# Patient Record
Sex: Male | Born: 1985 | Race: White | Hispanic: No | Marital: Single | Smoking: Never smoker
Health system: Southern US, Community
[De-identification: ages and names within clinical notes are randomized; demographics above are authoritative.]

## PROBLEM LIST (undated history)

## (undated) DIAGNOSIS — G7281 Critical illness myopathy: Secondary | ICD-10-CM

## (undated) DIAGNOSIS — E119 Type 2 diabetes mellitus without complications: Secondary | ICD-10-CM

## (undated) DIAGNOSIS — H9191 Unspecified hearing loss, right ear: Secondary | ICD-10-CM

## (undated) DIAGNOSIS — D62 Acute posthemorrhagic anemia: Secondary | ICD-10-CM

## (undated) DIAGNOSIS — G4730 Sleep apnea, unspecified: Secondary | ICD-10-CM

## (undated) DIAGNOSIS — I10 Essential (primary) hypertension: Secondary | ICD-10-CM

## (undated) HISTORY — PX: ULNAR NERVE REPAIR: SHX2594

## (undated) HISTORY — PX: TONSILLECTOMY: SUR1361

---
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
## 2006-09-26 ENCOUNTER — Emergency Department (HOSPITAL_COMMUNITY): Admission: EM | Admit: 2006-09-26 | Discharge: 2006-09-26 | Payer: Self-pay | Admitting: Emergency Medicine

## 2009-02-01 ENCOUNTER — Emergency Department (HOSPITAL_COMMUNITY): Admission: EM | Admit: 2009-02-01 | Discharge: 2009-02-01 | Payer: Self-pay | Admitting: Emergency Medicine

## 2009-02-01 IMAGING — CT CT PELVIS W/ CM
2 of 5 series · 16 of 46 positions shown, 18 images · IV contrast (Omnipaque 300)
Comparison: None

CT ABDOMEN

CLINICAL DATA: Left-sided abdominal and umbilical pain.  Nausea.

CT ABDOMEN AND PELVIS WITH CONTRAST
TECHNIQUE: Multidetector CT imaging of the abdomen and pelvis was
performed using the standard protocol following bolus
administration of intravenous contrast.
Contrast: 100 ml [OU]

[Series 2: abd_pel_with 5.0 b40f · axial · 0.81mm/px · z∈[-526,-80]mm · 13 of 99 slices shown, 15 images]
[im 5/99  soft-tissue]
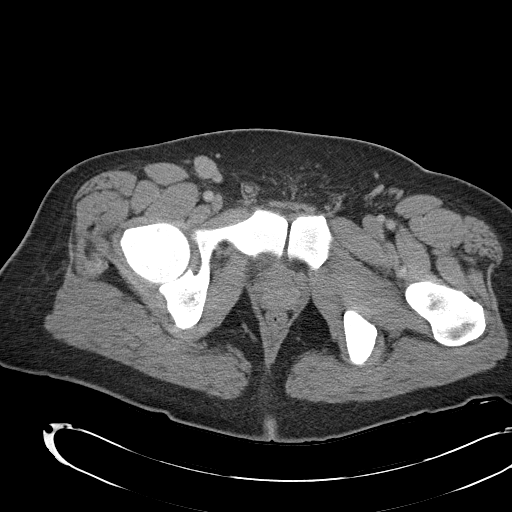
[im 5/99  bone]
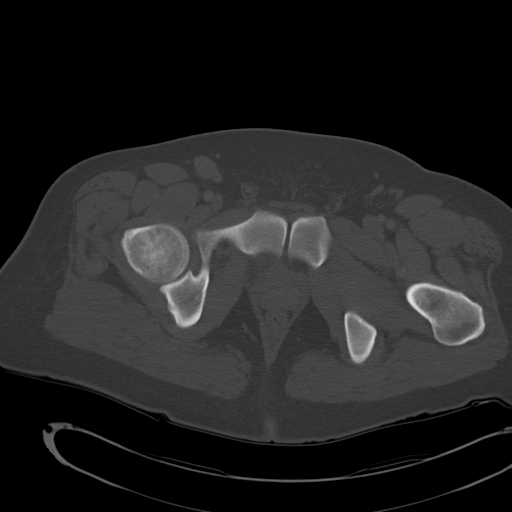
[im 15/99  soft-tissue]
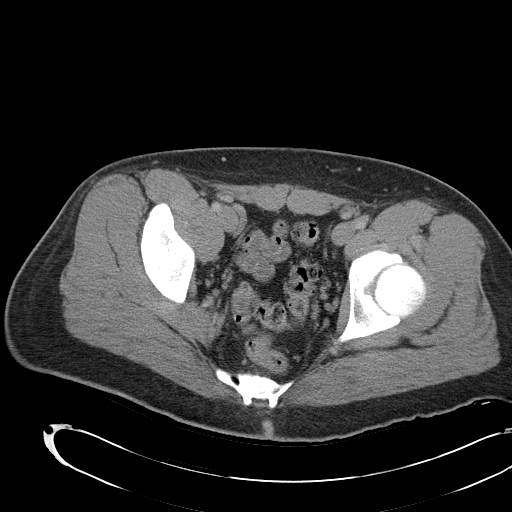
[im 19/99  soft-tissue]
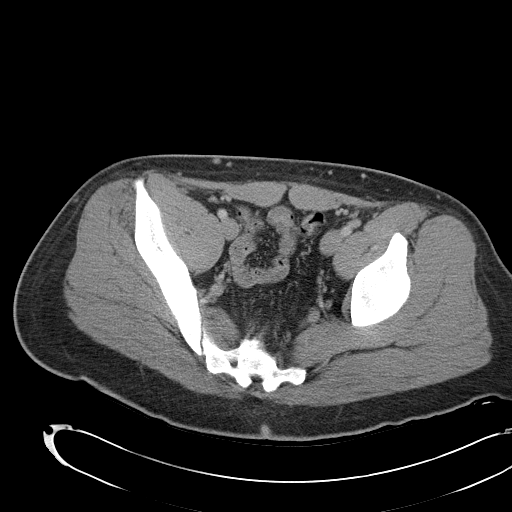
[im 29/99  soft-tissue]
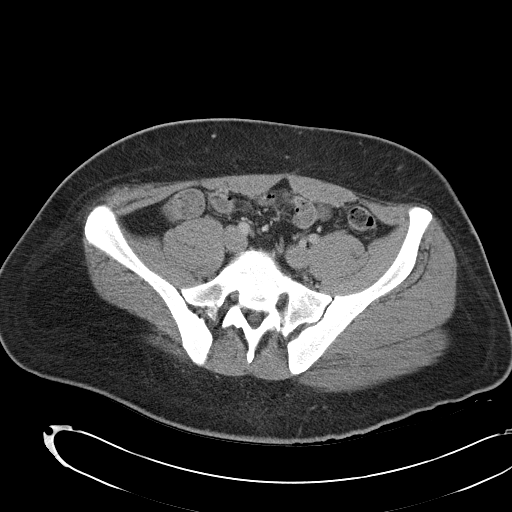
[im 33/99  soft-tissue]
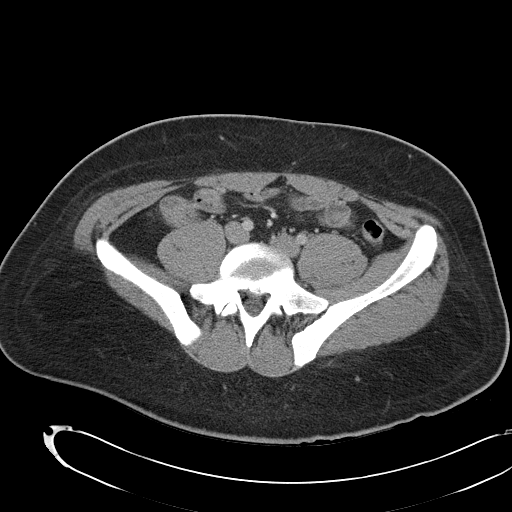
[im 43/99  soft-tissue]
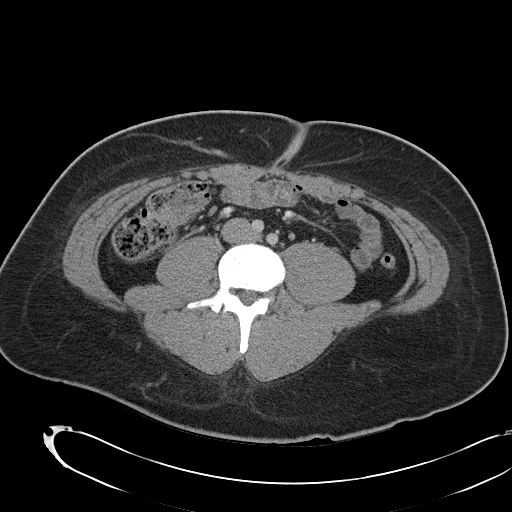
[im 52/99  soft-tissue]
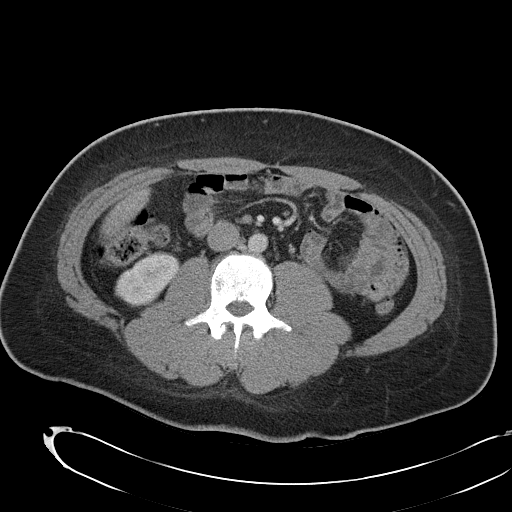
[im 57/99  soft-tissue]
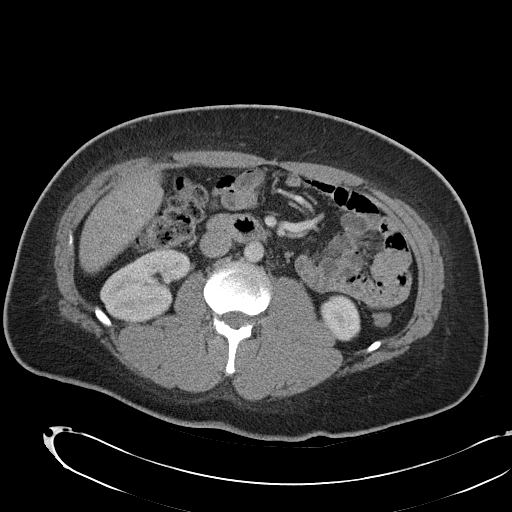
[im 66/99  soft-tissue]
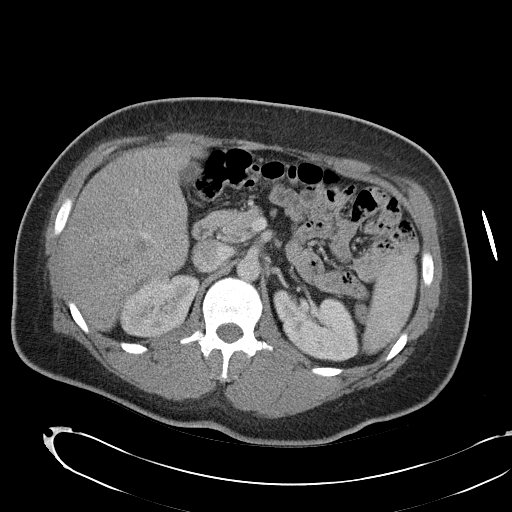
[im 66/99  bone]
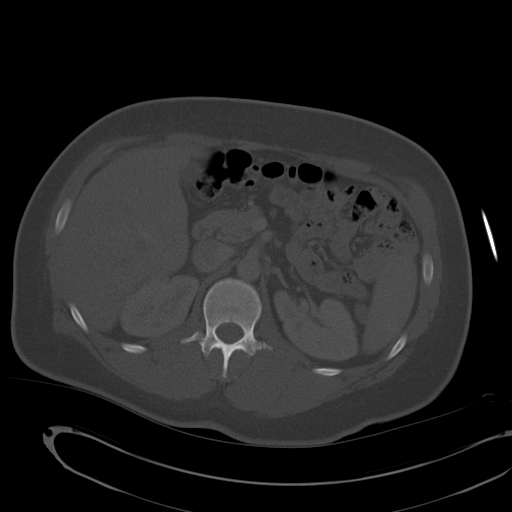
[im 71/99  soft-tissue]
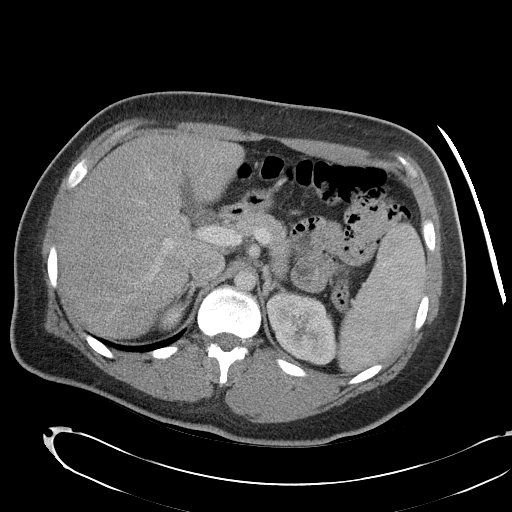
[im 80/99  soft-tissue]
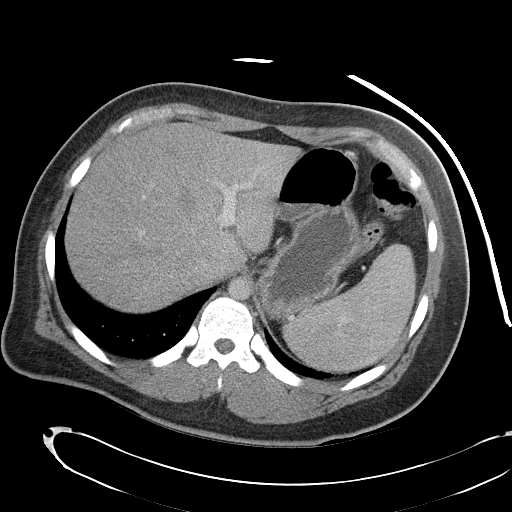
[im 85/99  soft-tissue]
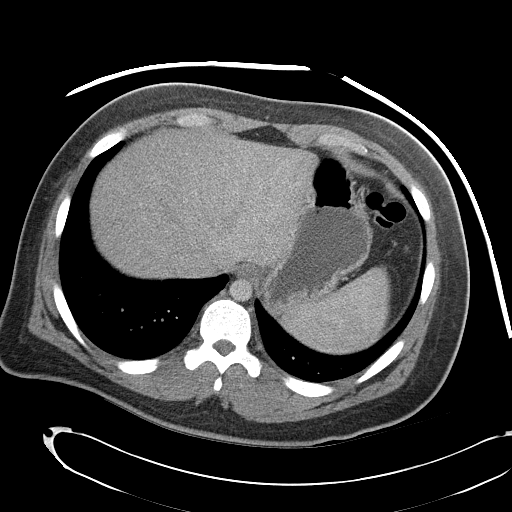
[im 94/99  soft-tissue]
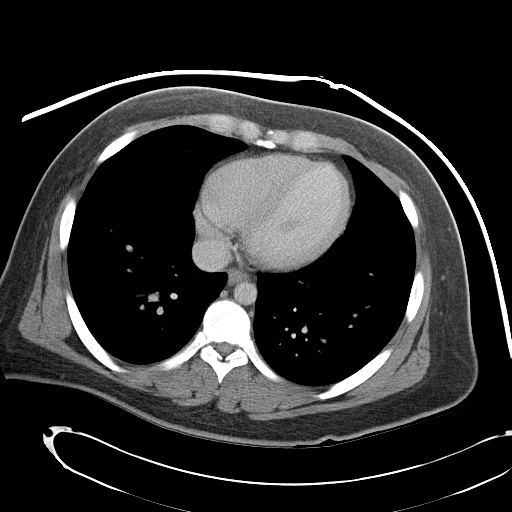

[Series 4: mpr cor post contrast (id) · coronal · 0.75mm/px · 3 of 82 slices shown]
[im 28/82  soft-tissue]
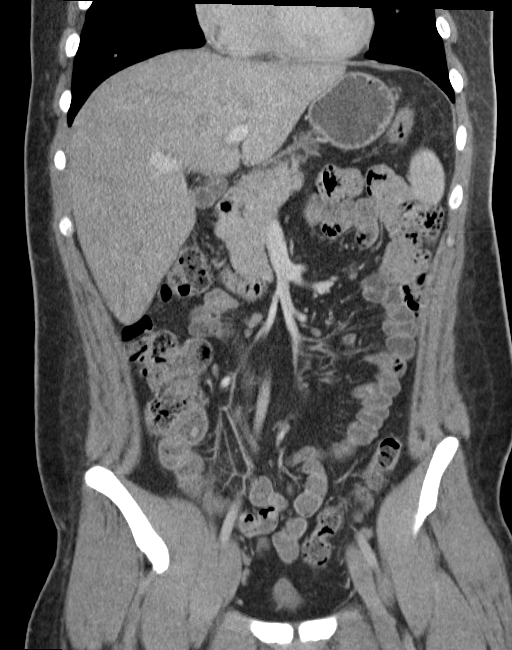
[im 37/82  soft-tissue]
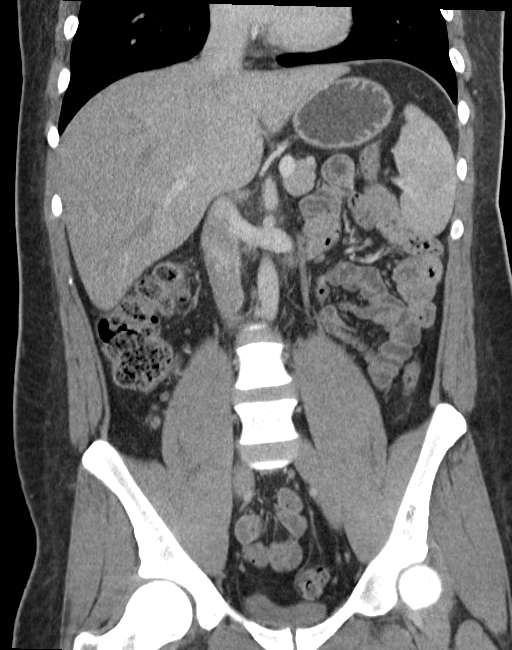
[im 46/82  soft-tissue]
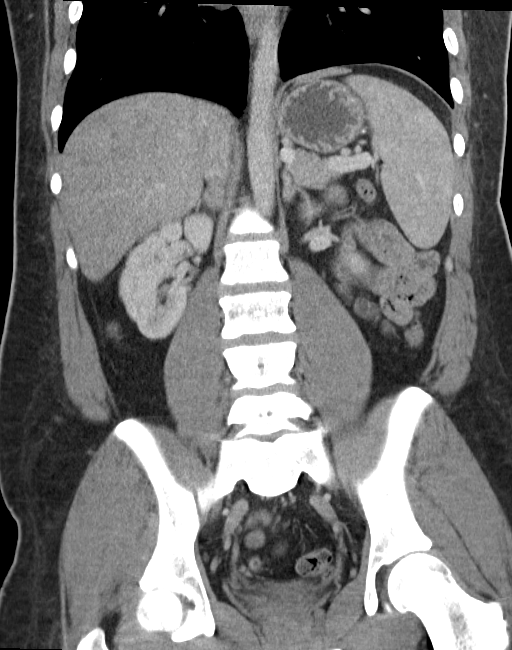

[16 of 46 positions shown; findings below may reference images not displayed]

FINDINGS: The abdominal parenchymal organs are normal in
appearance.  Gallbladder is unremarkable, and there is no evidence
of hydronephrosis.

There is no evidence of soft tissue masses or lymphadenopathy.  No
evidence of inflammatory process or abnormal fluid collections.
Unopacified bowel loops are unremarkable in appearance.
IMPRESSION: Negative abdomen CT.  No acute findings.

CT PELVIS
FINDINGS: There is no evidence of pelvic soft tissue masses or
lymphadenopathy.  There is no evidence of inflammatory process or
abnormal fluid collections.  Unopacified pelvic bowel loops are
unremarkable in appearance.  Shotty lymph nodes are seen in both
inguinal regions.
IMPRESSION: No acute findings.

## 2009-02-08 ENCOUNTER — Emergency Department: Payer: Self-pay | Admitting: Emergency Medicine

## 2009-07-22 ENCOUNTER — Emergency Department: Payer: Self-pay | Admitting: Internal Medicine

## 2010-11-18 LAB — DIFFERENTIAL
Band Neutrophils: 0 % (ref 0–10)
Basophils Absolute: 0 10*3/uL (ref 0.0–0.1)
Basophils Relative: 0 % (ref 0–1)
Blasts: 0 %
Eosinophils Absolute: 0.4 10*3/uL (ref 0.0–0.7)
Eosinophils Relative: 5 % (ref 0–5)
Lymphocytes Relative: 18 % (ref 12–46)
Lymphs Abs: 1.5 10*3/uL (ref 0.7–4.0)
Metamyelocytes Relative: 0 %
Monocytes Absolute: 0.7 10*3/uL (ref 0.1–1.0)
Monocytes Relative: 9 % (ref 3–12)
Myelocytes: 0 %
Neutro Abs: 5.7 10*3/uL (ref 1.7–7.7)
Neutrophils Relative %: 68 % (ref 43–77)
Promyelocytes Absolute: 0 %
nRBC: 0 /100 WBC

## 2010-11-18 LAB — URINALYSIS, ROUTINE W REFLEX MICROSCOPIC
Bilirubin Urine: NEGATIVE
Glucose, UA: NEGATIVE mg/dL
Hgb urine dipstick: NEGATIVE
Ketones, ur: NEGATIVE mg/dL
Nitrite: NEGATIVE
Protein, ur: NEGATIVE mg/dL
Specific Gravity, Urine: >1.03 — ABNORMAL HIGH (ref 1.005–1.030)
Urobilinogen, UA: 0.2 mg/dL (ref 0.0–1.0)
pH: 5.5 (ref 5.0–8.0)

## 2010-11-18 LAB — CBC
HCT: 45.4 % (ref 39.0–52.0)
Hemoglobin: 15.7 g/dL (ref 13.0–17.0)
MCHC: 34.6 g/dL (ref 30.0–36.0)
MCV: 91.9 fL (ref 78.0–100.0)
Platelets: 161 10*3/uL (ref 150–400)
RBC: 4.94 MIL/uL (ref 4.22–5.81)
RDW: 14 % (ref 11.5–15.5)
WBC: 8.3 10*3/uL (ref 4.0–10.5)

## 2010-11-18 LAB — COMPREHENSIVE METABOLIC PANEL
ALT: 28 U/L (ref 0–53)
AST: 19 U/L (ref 0–37)
Albumin: 4.1 g/dL (ref 3.5–5.2)
Alkaline Phosphatase: 57 U/L (ref 39–117)
BUN: 15 mg/dL (ref 6–23)
CO2: 32 mEq/L (ref 19–32)
Calcium: 9.5 mg/dL (ref 8.4–10.5)
Chloride: 106 mEq/L (ref 96–112)
Creatinine, Ser: 0.87 mg/dL (ref 0.4–1.5)
GFR calc Af Amer: >60 mL/min (ref >60)
GFR calc non Af Amer: >60 mL/min (ref >60)
Glucose, Bld: 91 mg/dL (ref 70–99)
Potassium: 4 mEq/L (ref 3.5–5.1)
Sodium: 141 mEq/L (ref 135–145)
Total Bilirubin: 0.5 mg/dL (ref 0.3–1.2)
Total Protein: 6.1 g/dL (ref 6.0–8.3)

## 2011-05-17 ENCOUNTER — Emergency Department (HOSPITAL_COMMUNITY): Payer: No Typology Code available for payment source

## 2011-05-17 ENCOUNTER — Emergency Department (HOSPITAL_COMMUNITY)
Admission: EM | Admit: 2011-05-17 | Discharge: 2011-05-17 | Disposition: A | Discharge status: Home / Self Care | Payer: No Typology Code available for payment source | Attending: Emergency Medicine | Admitting: Emergency Medicine

## 2011-05-17 DIAGNOSIS — S301XXA Contusion of abdominal wall, initial encounter: Secondary | ICD-10-CM | POA: Insufficient documentation

## 2011-05-17 DIAGNOSIS — M79609 Pain in unspecified limb: Secondary | ICD-10-CM | POA: Insufficient documentation

## 2011-05-17 DIAGNOSIS — M545 Low back pain, unspecified: Secondary | ICD-10-CM | POA: Insufficient documentation

## 2011-05-17 DIAGNOSIS — R109 Unspecified abdominal pain: Secondary | ICD-10-CM | POA: Insufficient documentation

## 2011-05-17 DIAGNOSIS — S8011XA Contusion of right lower leg, initial encounter: Secondary | ICD-10-CM

## 2011-05-17 DIAGNOSIS — S8010XA Contusion of unspecified lower leg, initial encounter: Secondary | ICD-10-CM | POA: Insufficient documentation

## 2011-05-17 LAB — URINALYSIS, ROUTINE W REFLEX MICROSCOPIC
Bilirubin Urine: NEGATIVE
Glucose, UA: NEGATIVE mg/dL
Ketones, ur: NEGATIVE mg/dL
Leukocytes, UA: NEGATIVE
Nitrite: NEGATIVE
Protein, ur: NEGATIVE mg/dL
Specific Gravity, Urine: 1.02 (ref 1.005–1.030)
Urobilinogen, UA: 0.2 mg/dL (ref 0.0–1.0)
pH: 7.5 (ref 5.0–8.0)

## 2011-05-17 LAB — DIFFERENTIAL
Basophils Absolute: 0 10*3/uL (ref 0.0–0.1)
Basophils Relative: 0 % (ref 0–1)
Eosinophils Absolute: 0 10*3/uL (ref 0.0–0.7)
Eosinophils Relative: 0 % (ref 0–5)
Lymphocytes Relative: 13 % (ref 12–46)
Lymphs Abs: 1.3 10*3/uL (ref 0.7–4.0)
Monocytes Absolute: 0.7 10*3/uL (ref 0.1–1.0)
Monocytes Relative: 7 % (ref 3–12)
Neutro Abs: 8.2 10*3/uL — ABNORMAL HIGH (ref 1.7–7.7)
Neutrophils Relative %: 80 % — ABNORMAL HIGH (ref 43–77)

## 2011-05-17 LAB — BASIC METABOLIC PANEL
BUN: 11 mg/dL (ref 6–23)
CO2: 29 mEq/L (ref 19–32)
Calcium: 9.1 mg/dL (ref 8.4–10.5)
Chloride: 102 mEq/L (ref 96–112)
Creatinine, Ser: 0.84 mg/dL (ref 0.50–1.35)
GFR calc Af Amer: >90 mL/min (ref >90)
GFR calc non Af Amer: >90 mL/min (ref >90)
Glucose, Bld: 104 mg/dL — ABNORMAL HIGH (ref 70–99)
Potassium: 4.1 mEq/L (ref 3.5–5.1)
Sodium: 138 mEq/L (ref 135–145)

## 2011-05-17 LAB — CBC
HCT: 45 % (ref 39.0–52.0)
Hemoglobin: 15.5 g/dL (ref 13.0–17.0)
MCH: 30.9 pg (ref 26.0–34.0)
MCHC: 34.4 g/dL (ref 30.0–36.0)
MCV: 89.8 fL (ref 78.0–100.0)
Platelets: 179 10*3/uL (ref 150–400)
RBC: 5.01 MIL/uL (ref 4.22–5.81)
RDW: 12.9 % (ref 11.5–15.5)
WBC: 10.3 10*3/uL (ref 4.0–10.5)

## 2011-05-17 LAB — URINE MICROSCOPIC-ADD ON
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
## 2011-05-17 MED ORDER — ONDANSETRON HCL 4 MG/2ML IJ SOLN
4.0000 mg | Freq: Once | INTRAMUSCULAR | Status: AC
  Administered 2011-05-17: 4 mg via INTRAVENOUS
  Filled 2011-05-17: qty 2

## 2011-05-17 MED ORDER — HYDROCODONE-ACETAMINOPHEN 5-325 MG PO TABS: 1.0000 | ORAL_TABLET | ORAL | Status: AC | PRN

## 2011-05-17 MED ORDER — HYDROMORPHONE HCL 1 MG/ML IJ SOLN
1.0000 mg | Freq: Once | INTRAMUSCULAR | Status: AC
  Administered 2011-05-17: 1 mg via INTRAVENOUS
  Filled 2011-05-17: qty 1

## 2011-05-17 MED ORDER — IOHEXOL 300 MG/ML  SOLN
100.0000 mL | Freq: Once | INTRAMUSCULAR | Status: AC | PRN
  Administered 2011-05-17: 100 mL via INTRAVENOUS

## 2011-05-17 NOTE — ED Notes (Signed)
[name] to nurse's station stating "my son has leg pain and there is a lump there". Dr. Adriana Simas made aware and is in to reassess patient.
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
## 2015-09-22 ENCOUNTER — Encounter: Payer: Self-pay | Admitting: Emergency Medicine

## 2015-09-22 ENCOUNTER — Emergency Department
Admission: EM | Admit: 2015-09-22 | Discharge: 2015-09-22 | Disposition: A | Discharge status: Home / Self Care | Payer: No Typology Code available for payment source | Attending: Emergency Medicine | Admitting: Emergency Medicine

## 2015-09-22 DIAGNOSIS — K0889 Other specified disorders of teeth and supporting structures: Secondary | ICD-10-CM | POA: Insufficient documentation

## 2015-09-22 DIAGNOSIS — Z79899 Other long term (current) drug therapy: Secondary | ICD-10-CM | POA: Insufficient documentation

## 2015-09-22 DIAGNOSIS — K047 Periapical abscess without sinus: Secondary | ICD-10-CM | POA: Insufficient documentation

## 2015-09-22 DIAGNOSIS — K029 Dental caries, unspecified: Secondary | ICD-10-CM | POA: Insufficient documentation

## 2015-09-22 MED ORDER — PENICILLIN V POTASSIUM 500 MG PO TABS
500.0000 mg | ORAL_TABLET | Freq: Once | ORAL | Status: AC
  Administered 2015-09-22: 500 mg via ORAL
  Filled 2015-09-22: qty 1

## 2015-09-22 MED ORDER — TRAMADOL HCL 50 MG PO TABS: 50.0000 mg | ORAL_TABLET | Freq: Two times a day (BID) | ORAL | Status: DC

## 2015-09-22 MED ORDER — PENICILLIN V POTASSIUM 500 MG PO TABS: 500.0000 mg | ORAL_TABLET | Freq: Four times a day (QID) | ORAL | Status: DC

## 2015-09-22 NOTE — Discharge Instructions (Signed)
Dental Abscess °A dental abscess is a collection of pus in or around a tooth. °CAUSES °This condition is caused by a bacterial infection around the root of the tooth that involves the inner part of the tooth (pulp). It may result from: °· Severe tooth decay. °· Trauma to the tooth that allows bacteria to enter into the pulp, such as a broken or chipped tooth. °· Severe gum disease around a tooth. °SYMPTOMS °Symptoms of this condition include: °· Severe pain in and around the infected tooth. °· Swelling and redness around the infected tooth, in the mouth, or in the face. °· Tenderness. °· Pus drainage. °· Bad breath. °· Bitter taste in the mouth. °· Difficulty swallowing. °· Difficulty opening the mouth. °· Nausea. °· Vomiting. °· Chills. °· Swollen neck glands. °· Fever. °DIAGNOSIS °This condition is diagnosed with examination of the infected tooth. During the exam, your dentist may tap on the infected tooth. Your dentist will also ask about your medical and dental history and may order X-rays. °TREATMENT °This condition is treated by eliminating the infection. This may be done with: °· Antibiotic medicine. °· A root canal. This may be performed to save the tooth. °· Pulling (extracting) the tooth. This may also involve draining the abscess. This is done if the tooth cannot be saved. °HOME CARE INSTRUCTIONS °· Take medicines only as directed by your dentist. °· If you were prescribed antibiotic medicine, finish all of it even if you start to feel better. °· Rinse your mouth (gargle) often with salt water to relieve pain or swelling. °· Do not drive or operate heavy machinery while taking pain medicine. °· Do not apply heat to the outside of your mouth. °· Keep all follow-up visits as directed by your dentist. This is important. °SEEK MEDICAL CARE IF: °· Your pain is worse and is not helped by medicine. °SEEK IMMEDIATE MEDICAL CARE IF: °· You have a fever or chills. °· Your symptoms suddenly get worse. °· You have a  very bad headache. °· You have problems breathing or swallowing. °· You have trouble opening your mouth. °· You have swelling in your neck or around your eye. °  °This information is not intended to replace advice given to you by your health care provider. Make sure you discuss any questions you have with your health care provider. °  °Document Released: 07/28/2005 Document Revised: 12/12/2014 Document Reviewed: 07/25/2014 °Elsevier Interactive Patient Education ©2016 Elsevier Inc. ° °Dental Caries °Dental caries (also called tooth decay) is the most common oral disease. It can occur at any age but is more common in children and young adults.  °HOW DENTAL CARIES DEVELOPS  °The process of decay begins when bacteria and foods (particularly sugars and starches) combine in your mouth to produce plaque. Plaque is a substance that sticks to the hard, outer surface of a tooth (enamel). The bacteria in plaque produce acids that attack enamel. These acids may also attack the root surface of a tooth (cementum) if it is exposed. Repeated attacks dissolve these surfaces and create holes in the tooth (cavities). If left untreated, the acids destroy the other layers of the tooth.  °RISK FACTORS °· Frequent sipping of sugary beverages.   °· Frequent snacking on sugary and starchy foods, especially those that easily get stuck in the teeth.   °· Poor oral hygiene.   °· Dry mouth.   °· Substance abuse such as methamphetamine abuse.   °· Broken or poor-fitting dental restorations.   °· Eating disorders.   °· Gastroesophageal reflux disease (  GERD).   Certain radiation treatments to the head and neck. SYMPTOMS In the early stages of dental caries, symptoms are seldom present. Sometimes white, chalky areas may be seen on the enamel or other tooth layers. In later stages, symptoms may include:  Pits and holes on the enamel.  Toothache after sweet, hot, or cold foods or drinks are consumed.  Pain around the tooth.  Swelling  around the tooth. DIAGNOSIS  Most of the time, dental caries is detected during a regular dental checkup. A diagnosis is made after a thorough medical and dental history is taken and the surfaces of your teeth are checked for signs of dental caries. Sometimes special instruments, such as lasers, are used to check for dental caries. Dental X-ray exams may be taken so that areas not visible to the eye (such as between the contact areas of the teeth) can be checked for cavities.  TREATMENT  If dental caries is in its early stages, it may be reversed with a fluoride treatment or an application of a remineralizing agent at the dental office. Thorough brushing and flossing at home is needed to aid these treatments. If it is in its later stages, treatment depends on the location and extent of tooth destruction:   If a small area of the tooth has been destroyed, the destroyed area will be removed and cavities will be filled with a material such as gold, silver amalgam, or composite resin.   If a large area of the tooth has been destroyed, the destroyed area will be removed and a cap (crown) will be fitted over the remaining tooth structure.   If the center part of the tooth (pulp) is affected, a procedure called a root canal will be needed before a filling or crown can be placed.   If most of the tooth has been destroyed, the tooth may need to be pulled (extracted). HOME CARE INSTRUCTIONS You can prevent, stop, or reverse dental caries at home by practicing good oral hygiene. Good oral hygiene includes:  Thoroughly cleaning your teeth at least twice a day with a toothbrush and dental floss.   Using a fluoride toothpaste. A fluoride mouth rinse may also be used if recommended by your dentist or health care provider.   Restricting the amount of sugary and starchy foods and sugary liquids you consume.   Avoiding frequent snacking on these foods and sipping of these liquids.   Keeping regular  visits with a dentist for checkups and cleanings. PREVENTION   Practice good oral hygiene.  Consider a dental sealant. A dental sealant is a coating material that is applied by your dentist to the pits and grooves of teeth. The sealant prevents food from being trapped in them. It may protect the teeth for several years.  Ask about fluoride supplements if you live in a community without fluorinated water or with water that has a low fluoride content. Use fluoride supplements as directed by your dentist or health care provider.  Allow fluoride varnish applications to teeth if directed by your dentist or health care provider.   This information is not intended to replace advice given to you by your health care provider. Make sure you discuss any questions you have with your health care provider.   Document Released: 04/19/2002 Document Revised: 08/18/2014 Document Reviewed: 07/30/2012 Elsevier Interactive Patient Education 2016 [place] and Dentist Visits Dental care supports good overall health. Regular dental visits can also help you avoid dental pain, bleeding, infection,  and other more serious health problems in the future. It is important to keep the mouth healthy because diseases in the teeth, gums, and other oral tissues can spread to other areas of the body. Some problems, such as diabetes, heart disease, and pre-term labor have been associated with poor oral health.  See your dentist every 6 months. If you experience emergency problems such as a toothache or broken tooth, go to the dentist right away. If you see your dentist regularly, you may catch problems early. It is easier to be treated for problems in the early stages.  WHAT TO EXPECT AT A DENTIST VISIT  Your dentist will look for many common oral health problems and recommend proper treatment. At your regular dental visit, you can expect:  Gentle cleaning of the teeth and gums. This includes scraping and  polishing. This helps to remove the sticky substance around the teeth and gums (plaque). Plaque forms in the mouth shortly after eating. Over time, plaque hardens on the teeth as tartar. If tartar is not removed regularly, it can cause problems. Cleaning also helps remove stains.  Periodic X-rays. These pictures of the teeth and supporting bone will help your dentist assess the health of your teeth.  Periodic fluoride treatments. Fluoride is a natural mineral shown to help strengthen teeth. Fluoride treatmentinvolves applying a fluoride gel or varnish to the teeth. It is most commonly done in children.  Examination of the mouth, tongue, jaws, teeth, and gums to look for any oral health problems, such as:  Cavities (dental caries). This is decay on the tooth caused by plaque, sugar, and acid in the mouth. It is best to catch a cavity when it is small.  Inflammation of the gums caused by plaque buildup (gingivitis).  Problems with the mouth or malformed or misaligned teeth.  Oral cancer or other diseases of the soft tissues or jaws. KEEP YOUR TEETH AND GUMS HEALTHY For healthy teeth and gums, follow these general guidelines as well as your dentist's specific advice:  Have your teeth professionally cleaned at the dentist every 6 months.  Brush twice daily with a fluoride toothpaste.  Floss your teeth daily.  Ask your dentist if you need fluoride supplements, treatments, or fluoride toothpaste.  Eat a healthy diet. Reduce foods and drinks with added sugar.  Avoid smoking. TREATMENT FOR ORAL HEALTH PROBLEMS If you have oral health problems, treatment varies depending on the conditions present in your teeth and gums.  Your caregiver will most likely recommend good oral hygiene at each visit.  For cavities, gingivitis, or other oral health disease, your caregiver will perform a procedure to treat the problem. This is typically done at a separate appointment. Sometimes your caregiver  will refer you to another dental specialist for specific tooth problems or for surgery. SEEK IMMEDIATE DENTAL CARE IF:  You have pain, bleeding, or soreness in the gum, tooth, jaw, or mouth area.  A permanent tooth becomes loose or separated from the gum socket.  You experience a blow or injury to the mouth or jaw area.   This information is not intended to replace advice given to you by your health care provider. Make sure you discuss any questions you have with your health care provider.   Document Released: 04/09/2011 Document Revised: 10/20/2011 Document Reviewed: 04/09/2011 Elsevier Interactive Patient Education [employer]  Take the antibiotic as directed, until completely gone. Follow-up with one of the dental clinics on the list below.   OPTIONS FOR DENTAL FOLLOW  UP CARE  [place] [facility] and [facility] - Local Safety Net Dental Clinics [email or web address]   [facility] [phone])  Sharl Ma [phone])  [place] [phone])  [facility] [phone])  [facility] [phone]) This clinic caters to the indigent population and is on a lottery system. Location: [employer] [facility], [employer], [address] [place] Clinic Hours: Wednesdays from 6pm - 9pm, patients seen by a lottery system. For dates, call or go to [email or web address] Services: Cleanings, fillings and simple extractions. Payment Options: DENTAL WORK IS FREE OF CHARGE. Bring proof of income or support. Best way to get seen: Arrive at 5:15 pm - this is a lottery, NOT first come/first serve, so arriving earlier will not increase your chances of being seen.     [facility] [phone] Select option 1 for emergencies   Location: [facility], [place], [address]  [place] Clinic Hours: No walk-ins accepted - call the day before to schedule an appointment. Check in times are 9:30 am and 1:30 pm. Services: Simple extractions, temporary fillings, pulpectomy/pulp debridement, uncomplicated abscess drainage. Payment Options: PAYMENT IS DUE AT THE TIME OF SERVICE.  Fee is usually $100-200, additional surgical procedures (e.g. abscess drainage) may be extra. Cash, checks, Visa/MasterCard accepted.  Can file Medicaid if patient is covered for dental - patient should call case worker to check. No discount for [facility] patients. Best way to get seen: MUST call the day before and get onto the schedule. Can usually be seen the next 1-2 days. No walk-ins accepted.     [facility] [phone]   Location: [facility], [address] [place] Clinic Hours: M, W, Th, F 8am or 1:30pm, Tues 9a or 1:30 - first come/first served. Services: Simple extractions, temporary fillings, uncomplicated abscess drainage.  You do not need to be an [facility] resident. Payment Options: PAYMENT IS DUE AT THE TIME OF SERVICE. Dental insurance, otherwise sliding scale - bring proof of income or support. Depending on income and treatment needed, cost is usually $50-200. Best way to get seen: Arrive early as it is first come/first served.     [facility] [phone]   Location: [address] Clinic Hours: Mon-Thu 8a-5p Services: Most basic dental services including extractions and fillings. Payment Options: PAYMENT IS DUE AT THE TIME OF SERVICE. Sliding scale, up to 50% off - bring proof if income or support. Medicaid with dental option accepted. Best way to get seen: Call to schedule an appointment, can usually be seen within 2 weeks OR they will try to see walk-ins - show up at 8a or 2p (you may have to wait).     [facility] [phone] [place]  RESIDENTS ONLY   Location: [facility], [address] [place], [place] [address] Clinic Hours: By appointment only. Monday - Thursday 8am-5pm, Friday 8am-12pm Services: Cleanings, fillings, extractions. Payment Options: PAYMENT IS DUE AT THE TIME OF SERVICE. Cash, Visa or MasterCard. Sliding scale - $30 minimum per service. Best way to get seen: Come in to office, complete packet and make an appointment - need proof of income or support monies for each household member and proof of [facility] residence. Usually takes about a month to get in.     [facility] [phone]   Location: [address] [facility] Hours:  Walk-in Urgent Care Dental Services are offered Monday-Friday mornings only. The numbers of emergencies accepted daily is limited to the number of providers available. Maximum 15 - Mondays, Wednesdays & Thursdays Maximum 10 - Tuesdays & Fridays Services: You do not need to be a [facility] resident to be seen for a dental emergency. Emergencies are defined as pain, swelling, abnormal bleeding, or dental trauma. Walkins will receive x-rays if needed. NOTE: Dental cleaning is not an emergency. Payment Options: PAYMENT IS DUE AT THE TIME OF SERVICE. Minimum co-pay is $40.00 for uninsured patients. Minimum co-pay is $3.00 for Medicaid with dental coverage. Dental Insurance is accepted and must be presented at time of visit. Medicare does not cover dental. Forms of payment: Cash, credit card, checks. Best way to get seen: If not previously registered with the clinic, walk-in dental registration begins at 7:15 am and is on a first come/first serve basis. If previously registered with the clinic, call to make an appointment.     [facility] [phone] [place] RESIDENTS ONLY   Location: [address] [place], [place] Clinic Hours: Mon-Thu 10a-2p Services: Extractions only! Payment  Options: FREE (donations accepted) - bring proof of income or support Best way to get seen: Call and schedule an appointment OR come at 8am on the 1st Monday of every month (except for holidays) when it is first come/first served.     [facility] [phone]   Location: [address] [place], [place] Clinic Hours: Friday mornings Services, Payment Options, Best way to get seen: Call for info

## 2015-09-22 NOTE — ED Notes (Signed)
C/o right upper tooth pain x 7 weeks.  States there has been a hole in tooth.  Patient has been taking ibuprofen and goody powder for pain.  Patient took his mother's amoxicillin RX and it has not helped tooth.

## 2015-09-22 NOTE — ED Provider Notes (Signed)
[facility] Emergency Department Provider Note ____________________________________________  Time seen: 66  I have reviewed the triage vital signs and the nursing notes.  HISTORY  Chief Complaint  Dental Pain  HPI Collin Brown is a 30 y.o. male possessive the ED accompanied by his mother for evaluation of right upper dental pain over the last several weeks. Patient describes a hole that he noted between hisupper 2nd and 3rd molars, previously treated with an amalgam filling at age 70. He denies fevers, chills, or sweats. He apparently has completed a 10-day course of a amoxicillin prescription previously prescribed to his mother over the last 24 hours, and notes continued pain to the upper jaw. His mom describes as some point he had swelling to the face but he did not seek medical care at that time. He presents here for continued upper tooth pain and concern for an ongoing dental abscess. Patient rates his pain to the upper jaw and a 3/10 at intake, and a 10/10 once he was assessed in the room, 40 minutes later.  History reviewed. No pertinent past medical history.  There are no active problems to display for this patient.   Past Surgical History  Procedure Laterality Date  . Tonsillectomy      Current Outpatient Rx  Name  Route  Sig  Dispense  Refill  . ibuprofen (ADVIL,MOTRIN) 200 MG tablet   Oral   Take 600 mg by mouth every 6 (six) hours as needed. For headaches          . Multiple Vitamin (ONE-A-DAY MENS PO)   Oral   Take 1 tablet by mouth daily.           . naproxen sodium (ALEVE) 220 MG tablet   Oral   Take 220 mg by mouth daily as needed. For headaches          . Omega-3 Fatty Acids (FISH OIL PO)   Oral   Take 1 capsule by mouth daily.           . penicillin v potassium (VEETID) 500 MG tablet   Oral   Take 1 tablet (500 mg total) by mouth 4 (four) times daily.   40 tablet   0   . traMADol (ULTRAM) 50 MG tablet   Oral    Take 1 tablet (50 mg total) by mouth 2 (two) times daily.   10 tablet   0   . vitamin B-12 (CYANOCOBALAMIN) 500 MCG tablet   Oral   Take 500 mcg by mouth daily.            Allergies Review of patient's allergies indicates no known allergies.  No family history on file.  Social History Social History  Substance Use Topics  . Smoking status: Never Smoker   . Smokeless tobacco: None  . Alcohol Use: No   Review of Systems  Constitutional: Negative for fever. ENT: Negative for sore throat. Dental pain as above. Cardiovascular: Negative for chest pain. Skin: Negative for rash. Neurological: Negative for headaches, focal weakness or numbness. ____________________________________________  PHYSICAL EXAM:  VITAL SIGNS: ED Triage Vitals  Enc Vitals Group     BP 09/22/15 1620 148/91 mmHg     Pulse Rate 09/22/15 1617 84     Resp 09/22/15 1617 16     Temp 09/22/15 1617 97.7 F (36.5 C)     Temp src --      SpO2 09/22/15 1617 95 %     Weight 09/22/15 1617 300  lb (136.079 kg)     Height 09/22/15 1617  (1.753 m)     Head Cir --      Peak Flow --      Pain Score 09/22/15 1619 3     Pain Loc --      Pain Edu? --      Excl. in GC? --    Constitutional: Alert and oriented. Well appearing and in no distress. Head: Normocephalic and atraumatic.      Eyes: Conjunctivae are normal. PERRL. Normal extraocular movements      Ears: Canals clear. TMs intact bilaterally.   Nose: No congestion/rhinorrhea.   Mouth/Throat: Mucous membranes are moist. Patient with amalgam fillings noted to the upper molars. No gum swelling is appreciated. Mild gum erythema is noted. A small defect on the occlusal surface of the 2nd molar, between the 3rd molar is noted.    Neck: Supple. No thyromegaly. Hematological/Lymphatic/Immunological: No cervical lymphadenopathy. Cardiovascular: Normal rate, regular rhythm.  Respiratory: Normal respiratory effort. No  wheezes/rales/rhonchi. Musculoskeletal: Nontender with normal range of motion in all extremities.  Neurologic:  Normal gait without ataxia. Normal speech and language. No gross focal neurologic deficits are appreciated. Skin:  Skin is warm, dry and intact. No rash noted. Psychiatric: Mood and affect are normal. Patient exhibits appropriate insight and judgment. ____________________________________________  PROCEDURES  Pen VK 500 mg PO ____________________________________________  INITIAL IMPRESSION / ASSESSMENT AND PLAN / ED COURSE  Patient with acute dental pain secondary to likely dental caries. There is no obvious edema or abscess formation on examination. Patient will be discharged with a prescription for Pen-Vee K to dose as directed. He is also provided with a prescription for #10 Ultram to dose as needed for pain. He will follow with her dental provider list given for definitive treatment. He is advised to rinse the mouth daily with warm salt water and brush twice daily with a soft bristle toothbrush. ____________________________________________  FINAL CLINICAL IMPRESSION(S) / ED DIAGNOSES  Final diagnoses:  Pain due to dental caries  Dental abscess      Lissa Hoard, PA-C 09/22/15 1939  Myrna Blazer, MD 09/22/15 2147
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
## 2017-12-21 ENCOUNTER — Encounter: Payer: Self-pay | Admitting: Emergency Medicine

## 2017-12-21 ENCOUNTER — Emergency Department
Admission: EM | Admit: 2017-12-21 | Discharge: 2017-12-21 | Disposition: A | Discharge status: Home / Self Care | Payer: Self-pay | Attending: Emergency Medicine | Admitting: Emergency Medicine

## 2017-12-21 ENCOUNTER — Emergency Department: Payer: Self-pay

## 2017-12-21 ENCOUNTER — Other Ambulatory Visit: Payer: Self-pay

## 2017-12-21 DIAGNOSIS — Y939 Activity, unspecified: Secondary | ICD-10-CM | POA: Insufficient documentation

## 2017-12-21 DIAGNOSIS — M25562 Pain in left knee: Secondary | ICD-10-CM | POA: Insufficient documentation

## 2017-12-21 DIAGNOSIS — M25511 Pain in right shoulder: Secondary | ICD-10-CM | POA: Insufficient documentation

## 2017-12-21 DIAGNOSIS — Y92410 Unspecified street and highway as the place of occurrence of the external cause: Secondary | ICD-10-CM | POA: Insufficient documentation

## 2017-12-21 DIAGNOSIS — M25559 Pain in unspecified hip: Secondary | ICD-10-CM | POA: Insufficient documentation

## 2017-12-21 DIAGNOSIS — M25561 Pain in right knee: Secondary | ICD-10-CM | POA: Insufficient documentation

## 2017-12-21 DIAGNOSIS — S301XXA Contusion of abdominal wall, initial encounter: Secondary | ICD-10-CM

## 2017-12-21 DIAGNOSIS — Y999 Unspecified external cause status: Secondary | ICD-10-CM | POA: Insufficient documentation

## 2017-12-21 DIAGNOSIS — M79661 Pain in right lower leg: Secondary | ICD-10-CM | POA: Insufficient documentation

## 2017-12-21 DIAGNOSIS — M79609 Pain in unspecified limb: Secondary | ICD-10-CM

## 2017-12-21 HISTORY — DX: Unspecified hearing loss, right ear: H91.91
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
## 2017-12-21 MED ORDER — CYCLOBENZAPRINE HCL 10 MG PO TABS
10.0000 mg | ORAL_TABLET | Freq: Once | ORAL | Status: AC
Start: 2017-12-21 — End: 2017-12-21
  Administered 2017-12-21: 10 mg via ORAL
  Filled 2017-12-21: qty 1

## 2017-12-21 MED ORDER — TRAMADOL HCL 50 MG PO TABS
50.0000 mg | ORAL_TABLET | Freq: Once | ORAL | Status: AC
  Administered 2017-12-21: 50 mg via ORAL
  Filled 2017-12-21: qty 1

## 2017-12-21 MED ORDER — CYCLOBENZAPRINE HCL 10 MG PO TABS: 10.0000 mg | ORAL_TABLET | Freq: Three times a day (TID) | ORAL | 0 refills | Status: DC | PRN

## 2017-12-21 MED ORDER — TRAMADOL HCL 50 MG PO TABS: 50.0000 mg | ORAL_TABLET | Freq: Two times a day (BID) | ORAL | 0 refills | Status: DC | PRN

## 2017-12-21 MED ORDER — IBUPROFEN 800 MG PO TABS
800.0000 mg | ORAL_TABLET | Freq: Once | ORAL | Status: AC
  Administered 2017-12-21: 800 mg via ORAL
  Filled 2017-12-21: qty 1

## 2017-12-21 MED ORDER — IBUPROFEN 800 MG PO TABS: 800.0000 mg | ORAL_TABLET | Freq: Three times a day (TID) | ORAL | 0 refills | Status: DC | PRN

## 2017-12-21 NOTE — ED Notes (Signed)
Patient transported to X-ray 
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
## 2017-12-21 NOTE — ED Notes (Signed)
Pts visitor stated, "I'm going to be the doctors worst nightmare."

## 2018-06-13 ENCOUNTER — Encounter: Payer: Self-pay | Admitting: Emergency Medicine

## 2018-06-13 ENCOUNTER — Other Ambulatory Visit: Payer: Self-pay

## 2018-06-13 ENCOUNTER — Ambulatory Visit
Admission: EM | Admit: 2018-06-13 | Discharge: 2018-06-13 | Disposition: A | Discharge status: Home / Self Care | Payer: Self-pay | Attending: Emergency Medicine | Admitting: Emergency Medicine

## 2018-06-13 DIAGNOSIS — J069 Acute upper respiratory infection, unspecified: Secondary | ICD-10-CM

## 2018-06-13 DIAGNOSIS — J209 Acute bronchitis, unspecified: Secondary | ICD-10-CM

## 2018-06-13 MED ORDER — HYDROCOD POLST-CPM POLST ER 10-8 MG/5ML PO SUER: 5.0000 mL | Freq: Every evening | ORAL | 0 refills | Status: DC | PRN

## 2018-06-13 MED ORDER — BENZONATATE 100 MG PO CAPS: 100.0000 mg | ORAL_CAPSULE | Freq: Three times a day (TID) | ORAL | 0 refills | Status: DC | PRN

## 2018-06-13 MED ORDER — DOXYCYCLINE HYCLATE 100 MG PO CAPS: 100.0000 mg | ORAL_CAPSULE | Freq: Two times a day (BID) | ORAL | 0 refills | Status: DC

## 2018-06-13 NOTE — Discharge Instructions (Addendum)
Take medication as prescribed. Rest. Drink plenty of fluids.  ° °Follow up with your primary care physician this week as needed. Return to Urgent care for new or worsening concerns.  ° °

## 2018-06-13 NOTE — ED Provider Notes (Signed)
[facility] ____________________________________________  Time seen: Approximately 10:37 AM  I have reviewed the triage vital signs and the nursing notes.   HISTORY  Chief Complaint Cough   HPI Collin Brown is a 32 y.o. male presented for evaluation of 1 week of cough, chest congestion and some nasal congestion.  States cough is present day and night, sometimes worse at night.  States that he feels sore in his stomach from coughing but denies any other abdominal pain.  Reports multiple sick contacts at work recently.  No accompanying fevers.  Has tried over-the-counter Mucinex and DayQuil without resolution, minimal improvement.  Occasional sore throat, not currently.  Denies any constant sinus pain, but reports some intermittent pressure.  Denies accompanying chest pain, shortness of breath or hemoptysis.  Cough is often dry but occasionally productive.  Continues to overall eat and drink well.  Denies other relieving factors.  Denies recent sickness.   Past Medical History:  Diagnosis Date  . Hearing loss in right ear     There are no active problems to display for this patient.   Past Surgical History:  Procedure Laterality Date  . TONSILLECTOMY       No current facility-administered medications for this encounter.   Current Outpatient Medications:  Marland Kitchen  Multiple Vitamin (ONE-A-DAY MENS PO), Take 1 tablet by mouth daily.  , Disp: , Rfl:  .  Omega-3 Fatty Acids (FISH OIL PO), Take 1 capsule by mouth daily.  , Disp: , Rfl:  .  benzonatate (TESSALON PERLES) 100 MG capsule, Take 1 capsule (100 mg total) by mouth 3 (three) times daily as needed for cough., Disp: 15 capsule, Rfl: 0 .  chlorpheniramine-HYDROcodone (TUSSIONEX PENNKINETIC ER) 10-8 MG/5ML SUER, Take 5 mLs by mouth at bedtime as needed for cough. do not drive or operate machinery while taking as can cause drowsiness., Disp: 50 mL, Rfl: 0 .  doxycycline (VIBRAMYCIN) 100 MG capsule, Take 1 capsule (100 mg  total) by mouth 2 (two) times daily., Disp: 20 capsule, Rfl: 0  Allergies Patient has no known allergies.  History reviewed. No pertinent family history.  Social History Social History   Tobacco Use  . Smoking status: Never Smoker  . Smokeless tobacco: Never Used  Substance Use Topics  . Alcohol use: No  . Drug use: No    Review of Systems Constitutional: No fever ENT: as above.  Cardiovascular: Denies chest pain. Respiratory: Denies shortness of breath. Gastrointestinal: No abdominal pain. Musculoskeletal: Negative for back pain. Skin: Negative for rash.   ____________________________________________   PHYSICAL EXAM:  VITAL SIGNS: ED Triage Vitals  Enc Vitals Group     BP 06/13/18 0947 (!) 141/100     Pulse Rate 06/13/18 0947 85     Resp 06/13/18 0947 18     Temp 06/13/18 0947 98.4 F (36.9 C)     Temp Source 06/13/18 0947 Oral     SpO2 06/13/18 0947 97 %     Weight 06/13/18 0945 (!) 350 lb (158.8 kg)     Height 06/13/18 0945 5\' 10"  (1.778 m)     Head Circumference --      Peak Flow --      Pain Score 06/13/18 0944 0     Pain Loc --      Pain Edu? --      Excl. in GC? --     Constitutional: Alert and oriented. Well appearing and in no acute distress. Eyes: Conjunctivae are normal.  Head: Atraumatic. No sinus  tenderness to palpation. No swelling. No erythema.  Ears: no erythema, normal TMs bilaterally.   Nose:Nasal congestion with clear rhinorrhea  Mouth/Throat: Mucous membranes are moist. No pharyngeal erythema. No tonsillar swelling or exudate.  Neck: No stridor.  No cervical spine tenderness to palpation. Hematological/Lymphatic/Immunilogical: No cervical lymphadenopathy. Cardiovascular: Normal rate, regular rhythm. Grossly normal heart sounds.  Good peripheral circulation. Respiratory: Normal respiratory effort.  No retractions. No wheezes.  Scattered rhonchi.  Dry intermittent cough noted in room.  Speaks in complete sentences.  Good air movement.    Musculoskeletal: Ambulatory with steady gait.  Neurologic:  Normal speech and language. No gait instability. Skin:  Skin appears warm, dry and intact. No rash noted. Psychiatric: Mood and affect are normal. Speech and behavior are normal.  ___________________________________________   LABS (all labs ordered are listed, but only abnormal results are displayed)  Labs Reviewed - No data to display   PROCEDURES Procedures    INITIAL IMPRESSION / ASSESSMENT AND PLAN / ED COURSE  Pertinent labs & imaging results that were available during my care of the patient were reviewed by me and considered in my medical decision making (see chart for details).  Overall well-appearing patient.  No acute distress.  Suspect recent viral upper respiratory infection.  As with continued scattered rhonchi, will start on oral doxycycline.  PRN Tessalon Perles and Tussionex.  Encourage rest, fluids, supportive care.  Discussed strict follow-up and return parameters.Discussed indication, risks and benefits of medications with patient.  Discussed follow up with Primary care physician this week. Discussed follow up and return parameters including no resolution or any worsening concerns. Patient verbalized understanding and agreed to plan.   ____________________________________________   FINAL CLINICAL IMPRESSION(S) / ED DIAGNOSES  Final diagnoses:  Acute bronchitis, unspecified organism  Upper respiratory tract infection, unspecified type     ED Discharge Orders         Ordered    doxycycline (VIBRAMYCIN) 100 MG capsule  2 times daily     06/13/18 1045    benzonatate (TESSALON PERLES) 100 MG capsule  3 times daily PRN     06/13/18 1045    chlorpheniramine-HYDROcodone (TUSSIONEX PENNKINETIC ER) 10-8 MG/5ML SUER  At bedtime PRN     06/13/18 1045           Note: This dictation was prepared with Dragon dictation along with smaller phrase technology. Any transcriptional errors that result from  this process are unintentional.         Renford Dills, NP 06/13/18 1359

## 2018-06-13 NOTE — ED Triage Notes (Signed)
Patient c/o cough that started 1 week ago. He has tried OTC Mucinex and Dayquil with no relief.

## 2019-12-23 IMAGING — CR DG FOREARM 2V*L*
1 series · 2 of 2 positions shown · non-contrast
Comparison: None.

CLINICAL DATA: Pain

EXAM:
LEFT FOREARM - 2 VIEW

[Series 1: dg forearm left · 0.14mm/px · 2 of 2 slices shown]
[im 1/2]
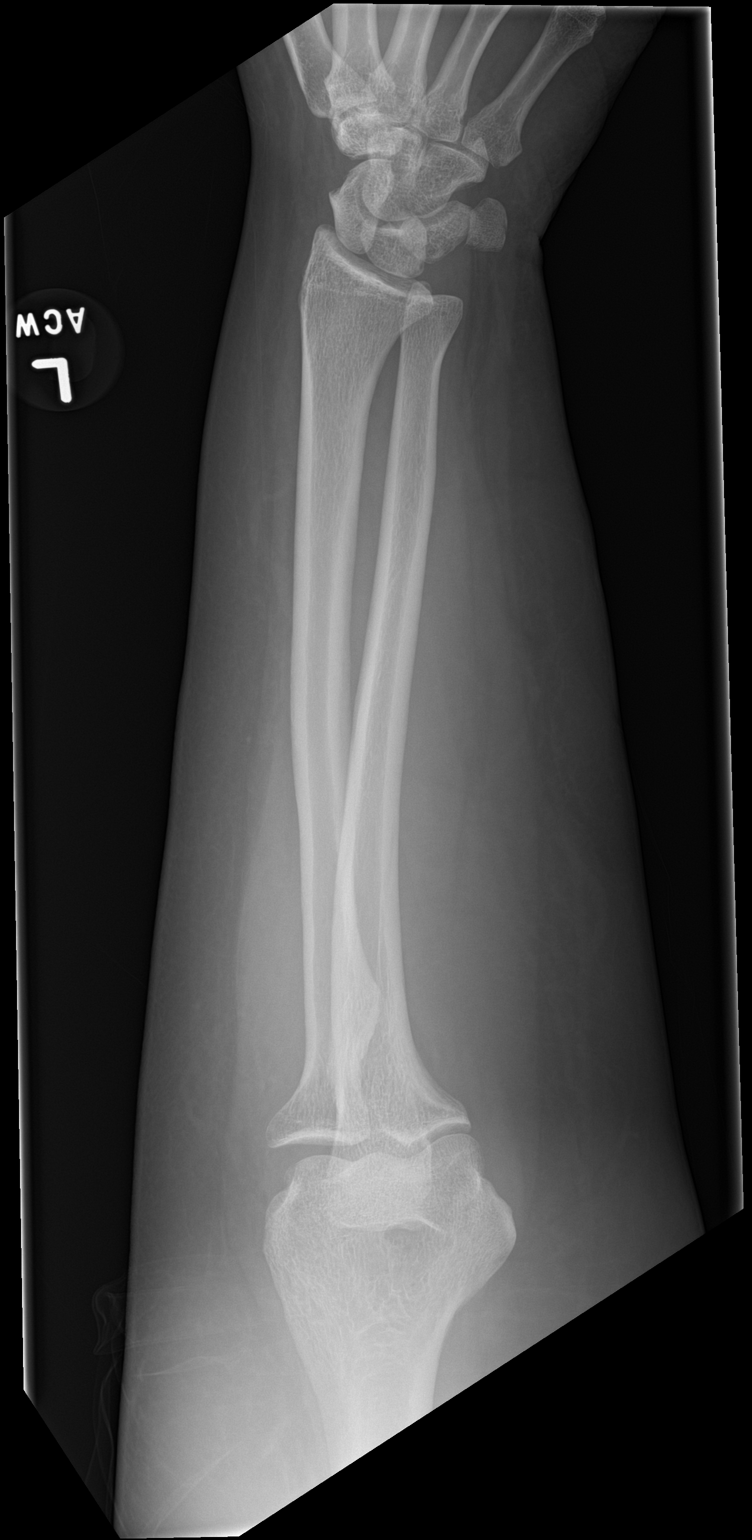
[im 2/2]
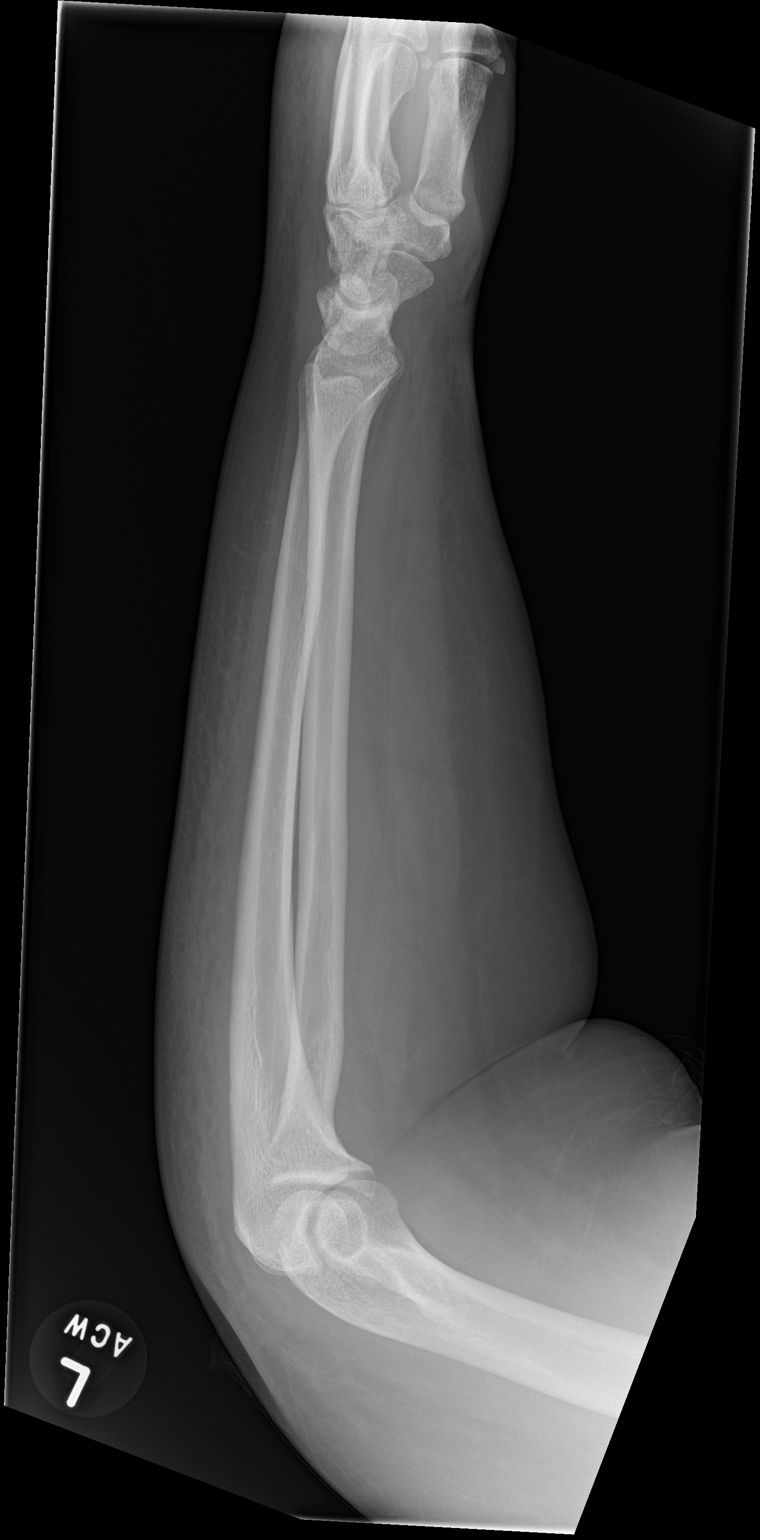

[2 of 2 positions shown; findings below may reference images not displayed]

FINDINGS: There is soft tissue swelling without evidence for an acute
displaced fracture or dislocation. There is no radiopaque foreign
body.
IMPRESSION: Soft tissue swelling without evidence for an acute displaced
fracture or dislocation.

## 2020-04-12 ENCOUNTER — Ambulatory Visit
Admission: EM | Admit: 2020-04-12 | Discharge: 2020-04-12 | Disposition: A | Discharge status: Home / Self Care | Payer: BC Managed Care – PPO | Attending: Internal Medicine | Admitting: Internal Medicine

## 2020-04-12 ENCOUNTER — Other Ambulatory Visit: Payer: Self-pay

## 2020-04-12 ENCOUNTER — Encounter: Payer: Self-pay | Admitting: Emergency Medicine

## 2020-04-12 DIAGNOSIS — H66005 Acute suppurative otitis media without spontaneous rupture of ear drum, recurrent, left ear: Secondary | ICD-10-CM

## 2020-04-12 DIAGNOSIS — H60332 Swimmer's ear, left ear: Secondary | ICD-10-CM | POA: Diagnosis not present

## 2020-04-12 MED ORDER — AMOXICILLIN-POT CLAVULANATE 875-125 MG PO TABS: 1.0000 | ORAL_TABLET | Freq: Two times a day (BID) | ORAL | 0 refills | Status: DC

## 2020-04-12 MED ORDER — NEOMYCIN-POLYMYXIN-HC 3.5-10000-1 OT SUSP: 4.0000 [drp] | Freq: Three times a day (TID) | OTIC | 0 refills | Status: AC

## 2020-04-12 NOTE — Discharge Instructions (Addendum)
Make sure you see ear nose and throat doctor.  Use Flonase 2 sprays each nostril every day ones a day for 7-10 days and even after that as needed for stuffiness

## 2020-04-12 NOTE — ED Triage Notes (Signed)
Patient c/o left ear pain that started 3 days ago.

## 2020-04-12 NOTE — ED Provider Notes (Signed)
[facility]    CSN: [id number] Arrival date & time: 04/12/20  1423      History   Chief Complaint Chief Complaint  Patient presents with  . Otalgia    HPI Collin Brown is a 34 y.o. male who is complaining with L ear pain x 3 days. Has swam 3 weeks ago. Has hx of chronic OM and hearing loss and has not wore his hearing aids since middle school. Has not seen ENT since. His grandmother gave him 2 doses of her left over Augmentin which he feels helped a little. She noticed his L ear canal is swollen.     Past Medical History:  Diagnosis Date  . Hearing loss in right ear     There are no problems to display for this patient.   Past Surgical History:  Procedure Laterality Date  . TONSILLECTOMY         Home Medications    Prior to Admission medications   Medication Sig Start Date End Date Taking? Authorizing Provider  benzonatate (TESSALON PERLES) 100 MG capsule Take 1 capsule (100 mg total) by mouth 3 (three) times daily as needed for cough. 06/13/18   Renford Dills, NP  chlorpheniramine-HYDROcodone [facility] PENNKINETIC ER) 10-8 MG/5ML SUER Take 5 mLs by mouth at bedtime as needed for cough. do not drive or operate machinery while taking as can cause drowsiness. 06/13/18   Renford Dills, NP  doxycycline (VIBRAMYCIN) 100 MG capsule Take 1 capsule (100 mg total) by mouth 2 (two) times daily. 06/13/18   Renford Dills, NP  Multiple Vitamin (ONE-A-DAY MENS PO) Take 1 tablet by mouth daily.      [provider]  Omega-3 Fatty Acids (FISH OIL PO) Take 1 capsule by mouth daily.      [provider]    Family History History reviewed. No pertinent family history.  Social History Social History   Tobacco Use  . Smoking status: Never Smoker  . Smokeless tobacco: Never Used  Vaping Use  . Vaping Use: Never used  Substance Use Topics  . Alcohol use: No  . Drug use: No   Allergies   Patient has no known allergies.   Review of  Systems Review of Systems   Physical Exam Triage Vital Signs ED Triage Vitals  Enc Vitals Group     BP 04/12/20 1518 (!) 156/96     Pulse Rate 04/12/20 1518 98     Resp 04/12/20 1518 18     Temp 04/12/20 1518 98.9 F (37.2 C)     Temp Source 04/12/20 1518 Oral     SpO2 04/12/20 1518 98 %     Weight 04/12/20 1516 (!) 310 lb (140.6 kg)     Height 04/12/20 1516 5\' 10"  (1.778 m)     Head Circumference --      Peak Flow --      Pain Score 04/12/20 1516 3     Pain Loc --      Pain Edu? --      Excl. in GC? --    No data found.  Updated Vital Signs BP (!) 156/96 (BP Location: Right Arm)   Pulse 98   Temp 98.9 F (37.2 C) (Oral)   Resp 18   Ht 5\' 10"  (1.778 m)   Wt (!) 310 lb (140.6 kg)   SpO2 98%   BMI 44.48 kg/m   Visual Acuity Right Eye Distance:   Left Eye Distance:   Bilateral Distance:  Right Eye Near:   Left Eye Near:    Bilateral Near:     Physical Exam Constitutional:      Appearance: He is obese.  HENT:     Head: Normocephalic.     Right Ear: Tympanic membrane, ear canal and external ear normal.     Ears:     Comments: L TM is not visible due to swelling of canal.     Nose: Congestion present.     Mouth/Throat:     Pharynx: Oropharynx is clear.  Eyes:     General: No scleral icterus.    Extraocular Movements: Extraocular movements intact.     Conjunctiva/sclera: Conjunctivae normal.  Neck:     Comments: L posterior chain Cardiovascular:     Rate and Rhythm: Regular rhythm.  Pulmonary:     Effort: Pulmonary effort is normal.     Breath sounds: Normal breath sounds.  Musculoskeletal:        General: Normal range of motion.     Cervical back: Neck supple.  Lymphadenopathy:     Cervical: Cervical adenopathy present.  Skin:    General: Skin is warm and dry.  Neurological:     Mental Status: He is alert and oriented to person, place, and time.     Gait: Gait normal.  Psychiatric:        Mood and Affect: Mood normal.        Behavior:  Behavior normal.        Thought Content: Thought content normal.        Judgment: Judgment normal.    UC Treatments / Results  Labs (all labs ordered are listed, but only abnormal results are displayed) Labs Reviewed - No data to display  EKG   Radiology No results found.  Procedures Procedures (including critical care time)  Medications Ordered in UC Medications - No data to display  Initial Impression / Assessment and Plan / UC Course  I have reviewed the triage vital signs and the nursing notes. Has L OE, and possibly OM. I prescribed him Cortisporin otic gtts as noted and Augmentin for 10 days. Advised to also Use Flonase and FU with ENT.  Needs to get established with a PCP.  Final Clinical Impressions(s) / UC Diagnoses   Final diagnoses:  None   Discharge Instructions   None    ED Prescriptions    None     PDMP not reviewed this encounter.   Garey Ham, PA-C 04/12/20 1547

## 2020-05-03 ENCOUNTER — Inpatient Hospital Stay
Admission: EM | Admit: 2020-05-03 | Discharge: 2020-06-08 | DRG: 207 | Disposition: A | Discharge status: Home / Self Care | Payer: BC Managed Care – PPO | Attending: Internal Medicine | Admitting: Internal Medicine

## 2020-05-03 ENCOUNTER — Emergency Department: Payer: BC Managed Care – PPO

## 2020-05-03 ENCOUNTER — Other Ambulatory Visit: Payer: Self-pay

## 2020-05-03 ENCOUNTER — Encounter: Payer: Self-pay | Admitting: Emergency Medicine

## 2020-05-03 DIAGNOSIS — G5620 Lesion of ulnar nerve, unspecified upper limb: Secondary | ICD-10-CM | POA: Diagnosis not present

## 2020-05-03 DIAGNOSIS — J1282 Pneumonia due to coronavirus disease 2019: Secondary | ICD-10-CM | POA: Diagnosis not present

## 2020-05-03 DIAGNOSIS — N179 Acute kidney failure, unspecified: Secondary | ICD-10-CM | POA: Diagnosis present

## 2020-05-03 DIAGNOSIS — K5900 Constipation, unspecified: Secondary | ICD-10-CM | POA: Diagnosis not present

## 2020-05-03 DIAGNOSIS — E876 Hypokalemia: Secondary | ICD-10-CM | POA: Diagnosis not present

## 2020-05-03 DIAGNOSIS — H9191 Unspecified hearing loss, right ear: Secondary | ICD-10-CM | POA: Diagnosis present

## 2020-05-03 DIAGNOSIS — J9601 Acute respiratory failure with hypoxia: Secondary | ICD-10-CM | POA: Diagnosis present

## 2020-05-03 DIAGNOSIS — S143XXS Injury of brachial plexus, sequela: Secondary | ICD-10-CM | POA: Diagnosis not present

## 2020-05-03 DIAGNOSIS — M25519 Pain in unspecified shoulder: Secondary | ICD-10-CM

## 2020-05-03 DIAGNOSIS — L8990 Pressure ulcer of unspecified site, unspecified stage: Secondary | ICD-10-CM | POA: Insufficient documentation

## 2020-05-03 DIAGNOSIS — Z4659 Encounter for fitting and adjustment of other gastrointestinal appliance and device: Secondary | ICD-10-CM

## 2020-05-03 DIAGNOSIS — J9622 Acute and chronic respiratory failure with hypercapnia: Secondary | ICD-10-CM | POA: Diagnosis present

## 2020-05-03 DIAGNOSIS — R000 Tachycardia, unspecified: Secondary | ICD-10-CM | POA: Diagnosis not present

## 2020-05-03 DIAGNOSIS — M5412 Radiculopathy, cervical region: Secondary | ICD-10-CM | POA: Diagnosis not present

## 2020-05-03 DIAGNOSIS — Z781 Physical restraint status: Secondary | ICD-10-CM | POA: Diagnosis not present

## 2020-05-03 DIAGNOSIS — M792 Neuralgia and neuritis, unspecified: Secondary | ICD-10-CM

## 2020-05-03 DIAGNOSIS — E1159 Type 2 diabetes mellitus with other circulatory complications: Secondary | ICD-10-CM

## 2020-05-03 DIAGNOSIS — I10 Essential (primary) hypertension: Secondary | ICD-10-CM

## 2020-05-03 DIAGNOSIS — Z6841 Body Mass Index (BMI) 40.0 and over, adult: Secondary | ICD-10-CM | POA: Diagnosis not present

## 2020-05-03 DIAGNOSIS — Z6833 Body mass index (BMI) 33.0-33.9, adult: Secondary | ICD-10-CM | POA: Diagnosis not present

## 2020-05-03 DIAGNOSIS — E1165 Type 2 diabetes mellitus with hyperglycemia: Secondary | ICD-10-CM | POA: Diagnosis present

## 2020-05-03 DIAGNOSIS — Z9981 Dependence on supplemental oxygen: Secondary | ICD-10-CM | POA: Diagnosis not present

## 2020-05-03 DIAGNOSIS — L89810 Pressure ulcer of head, unstageable: Secondary | ICD-10-CM | POA: Diagnosis not present

## 2020-05-03 DIAGNOSIS — L89892 Pressure ulcer of other site, stage 2: Secondary | ICD-10-CM | POA: Diagnosis present

## 2020-05-03 DIAGNOSIS — G928 Other toxic encephalopathy: Secondary | ICD-10-CM | POA: Diagnosis not present

## 2020-05-03 DIAGNOSIS — T402X5A Adverse effect of other opioids, initial encounter: Secondary | ICD-10-CM | POA: Diagnosis not present

## 2020-05-03 DIAGNOSIS — R9431 Abnormal electrocardiogram [ECG] [EKG]: Secondary | ICD-10-CM | POA: Diagnosis not present

## 2020-05-03 DIAGNOSIS — G4733 Obstructive sleep apnea (adult) (pediatric): Secondary | ICD-10-CM | POA: Diagnosis not present

## 2020-05-03 DIAGNOSIS — R29898 Other symptoms and signs involving the musculoskeletal system: Secondary | ICD-10-CM

## 2020-05-03 DIAGNOSIS — M25512 Pain in left shoulder: Secondary | ICD-10-CM | POA: Diagnosis not present

## 2020-05-03 DIAGNOSIS — N390 Urinary tract infection, site not specified: Secondary | ICD-10-CM | POA: Diagnosis not present

## 2020-05-03 DIAGNOSIS — G4730 Sleep apnea, unspecified: Secondary | ICD-10-CM | POA: Diagnosis present

## 2020-05-03 DIAGNOSIS — T380X5A Adverse effect of glucocorticoids and synthetic analogues, initial encounter: Secondary | ICD-10-CM | POA: Diagnosis not present

## 2020-05-03 DIAGNOSIS — A499 Bacterial infection, unspecified: Secondary | ICD-10-CM | POA: Diagnosis not present

## 2020-05-03 DIAGNOSIS — G7281 Critical illness myopathy: Secondary | ICD-10-CM | POA: Diagnosis not present

## 2020-05-03 DIAGNOSIS — E662 Morbid (severe) obesity with alveolar hypoventilation: Secondary | ICD-10-CM | POA: Diagnosis present

## 2020-05-03 DIAGNOSIS — E669 Obesity, unspecified: Secondary | ICD-10-CM | POA: Diagnosis not present

## 2020-05-03 DIAGNOSIS — S31000D Unspecified open wound of lower back and pelvis without penetration into retroperitoneum, subsequent encounter: Secondary | ICD-10-CM | POA: Diagnosis not present

## 2020-05-03 DIAGNOSIS — R9389 Abnormal findings on diagnostic imaging of other specified body structures: Secondary | ICD-10-CM

## 2020-05-03 DIAGNOSIS — T797XXA Traumatic subcutaneous emphysema, initial encounter: Secondary | ICD-10-CM | POA: Diagnosis not present

## 2020-05-03 DIAGNOSIS — I5031 Acute diastolic (congestive) heart failure: Secondary | ICD-10-CM | POA: Diagnosis present

## 2020-05-03 DIAGNOSIS — E870 Hyperosmolality and hypernatremia: Secondary | ICD-10-CM | POA: Diagnosis not present

## 2020-05-03 DIAGNOSIS — J9600 Acute respiratory failure, unspecified whether with hypoxia or hypercapnia: Secondary | ICD-10-CM

## 2020-05-03 DIAGNOSIS — Z833 Family history of diabetes mellitus: Secondary | ICD-10-CM | POA: Diagnosis not present

## 2020-05-03 DIAGNOSIS — Z452 Encounter for adjustment and management of vascular access device: Secondary | ICD-10-CM

## 2020-05-03 DIAGNOSIS — J9621 Acute and chronic respiratory failure with hypoxia: Secondary | ICD-10-CM | POA: Diagnosis present

## 2020-05-03 DIAGNOSIS — G540 Brachial plexus disorders: Secondary | ICD-10-CM | POA: Diagnosis not present

## 2020-05-03 DIAGNOSIS — D62 Acute posthemorrhagic anemia: Secondary | ICD-10-CM | POA: Diagnosis not present

## 2020-05-03 DIAGNOSIS — L89154 Pressure ulcer of sacral region, stage 4: Secondary | ICD-10-CM | POA: Diagnosis not present

## 2020-05-03 DIAGNOSIS — E119 Type 2 diabetes mellitus without complications: Secondary | ICD-10-CM | POA: Diagnosis not present

## 2020-05-03 DIAGNOSIS — K5903 Drug induced constipation: Secondary | ICD-10-CM | POA: Diagnosis not present

## 2020-05-03 DIAGNOSIS — I152 Hypertension secondary to endocrine disorders: Secondary | ICD-10-CM

## 2020-05-03 DIAGNOSIS — U071 COVID-19: Secondary | ICD-10-CM | POA: Diagnosis present

## 2020-05-03 DIAGNOSIS — I110 Hypertensive heart disease with heart failure: Secondary | ICD-10-CM | POA: Diagnosis present

## 2020-05-03 DIAGNOSIS — R5381 Other malaise: Secondary | ICD-10-CM | POA: Diagnosis not present

## 2020-05-03 DIAGNOSIS — J9690 Respiratory failure, unspecified, unspecified whether with hypoxia or hypercapnia: Secondary | ICD-10-CM

## 2020-05-03 DIAGNOSIS — E1169 Type 2 diabetes mellitus with other specified complication: Secondary | ICD-10-CM | POA: Diagnosis not present

## 2020-05-03 HISTORY — DX: Critical illness myopathy: G72.81

## 2020-05-03 HISTORY — DX: Sleep apnea, unspecified: G47.30

## 2020-05-03 LAB — LACTIC ACID, PLASMA
Lactic Acid, Venous: 1 mmol/L (ref 0.5–1.9)
Lactic Acid, Venous: 0.8 mmol/L (ref 0.5–1.9)

## 2020-05-03 LAB — URINALYSIS, COMPLETE (UACMP) WITH MICROSCOPIC
Bacteria, UA: NONE SEEN
Bilirubin Urine: NEGATIVE
Glucose, UA: NEGATIVE mg/dL
Hgb urine dipstick: NEGATIVE
Ketones, ur: 5 mg/dL — AB
Leukocytes,Ua: NEGATIVE
Nitrite: NEGATIVE
Protein, ur: 30 mg/dL — AB
Specific Gravity, Urine: 1.027 (ref 1.005–1.030)
pH: 5 (ref 5.0–8.0)

## 2020-05-03 LAB — BASIC METABOLIC PANEL
Anion gap: 9 (ref 5–15)
BUN: 14 mg/dL (ref 6–20)
CO2: 28 mmol/L (ref 22–32)
Calcium: 8.3 mg/dL — ABNORMAL LOW (ref 8.9–10.3)
Chloride: 98 mmol/L (ref 98–111)
Creatinine, Ser: 0.8 mg/dL (ref 0.61–1.24)
GFR calc Af Amer: >60 mL/min (ref >60)
GFR calc non Af Amer: >60 mL/min (ref >60)
Glucose, Bld: 125 mg/dL — ABNORMAL HIGH (ref 70–99)
Potassium: 3.8 mmol/L (ref 3.5–5.1)
Sodium: 135 mmol/L (ref 135–145)

## 2020-05-03 LAB — FERRITIN: Ferritin: 699 ng/mL — ABNORMAL HIGH (ref 24–336)

## 2020-05-03 LAB — RESPIRATORY PANEL BY RT PCR (FLU A&B, COVID)
Influenza A by PCR: NEGATIVE
Influenza B by PCR: NEGATIVE
SARS Coronavirus 2 by RT PCR: POSITIVE — AB

## 2020-05-03 LAB — CBC
HCT: 39.3 % (ref 39.0–52.0)
HCT: 39.3 % (ref 39.0–52.0)
Hemoglobin: 12.8 g/dL — ABNORMAL LOW (ref 13.0–17.0)
Hemoglobin: 13 g/dL (ref 13.0–17.0)
MCH: 27.4 pg (ref 26.0–34.0)
MCH: 27.8 pg (ref 26.0–34.0)
MCHC: 32.6 g/dL (ref 30.0–36.0)
MCHC: 33.1 g/dL (ref 30.0–36.0)
MCV: 84.2 fL (ref 80.0–100.0)
MCV: 84 fL (ref 80.0–100.0)
Platelets: 132 10*3/uL — ABNORMAL LOW (ref 150–400)
Platelets: 127 10*3/uL — ABNORMAL LOW (ref 150–400)
RBC: 4.67 MIL/uL (ref 4.22–5.81)
RBC: 4.68 MIL/uL (ref 4.22–5.81)
RDW: 15.2 % (ref 11.5–15.5)
RDW: 15.5 % (ref 11.5–15.5)
WBC: 3.2 10*3/uL — ABNORMAL LOW (ref 4.0–10.5)
WBC: 3.6 10*3/uL — ABNORMAL LOW (ref 4.0–10.5)
nRBC: 0 % (ref 0.0–0.2)
nRBC: 0 % (ref 0.0–0.2)

## 2020-05-03 LAB — CREATININE, SERUM
Creatinine, Ser: 0.84 mg/dL (ref 0.61–1.24)
GFR calc Af Amer: >60 mL/min (ref >60)
GFR calc non Af Amer: >60 mL/min (ref >60)

## 2020-05-03 LAB — HEPATIC FUNCTION PANEL
ALT: 44 U/L (ref 0–44)
AST: 52 U/L — ABNORMAL HIGH (ref 15–41)
Albumin: 3.7 g/dL (ref 3.5–5.0)
Alkaline Phosphatase: 51 U/L (ref 38–126)
Bilirubin, Direct: <0.1 mg/dL (ref 0.0–0.2)
Total Bilirubin: 0.6 mg/dL (ref 0.3–1.2)
Total Protein: 6.6 g/dL (ref 6.5–8.1)

## 2020-05-03 LAB — TSH: TSH: 0.708 u[IU]/mL (ref 0.350–4.500)

## 2020-05-03 LAB — TROPONIN I (HIGH SENSITIVITY)
Troponin I (High Sensitivity): 15 ng/L (ref <18)
Troponin I (High Sensitivity): 17 ng/L (ref <18)

## 2020-05-03 LAB — HIV ANTIBODY (ROUTINE TESTING W REFLEX): HIV Screen 4th Generation wRfx: NONREACTIVE

## 2020-05-03 LAB — PROCALCITONIN: Procalcitonin: 0.14 ng/mL

## 2020-05-03 LAB — FIBRIN DERIVATIVES D-DIMER (ARMC ONLY): Fibrin derivatives D-dimer (ARMC): 1102.65 ng/mL (FEU) — ABNORMAL HIGH (ref 0.00–499.00)

## 2020-05-03 LAB — LACTATE DEHYDROGENASE: LDH: 343 U/L — ABNORMAL HIGH (ref 98–192)

## 2020-05-03 LAB — FIBRINOGEN: Fibrinogen: 551 mg/dL — ABNORMAL HIGH (ref 210–475)

## 2020-05-03 LAB — BRAIN NATRIURETIC PEPTIDE: B Natriuretic Peptide: 31.1 pg/mL (ref 0.0–100.0)

## 2020-05-03 LAB — C-REACTIVE PROTEIN: CRP: 14.2 mg/dL — ABNORMAL HIGH (ref <1.0)

## 2020-05-03 LAB — TRIGLYCERIDES: Triglycerides: 96 mg/dL (ref <150)

## 2020-05-03 LAB — T4, FREE: Free T4: 0.74 ng/dL (ref 0.61–1.12)

## 2020-05-03 IMAGING — DX DG CHEST 1V PORT
1 series · 1 of 1 positions shown · non-contrast
Comparison: None.

CLINICAL DATA: COVID pneumonia.

EXAM:
PORTABLE CHEST 1 VIEW

[chest ap]
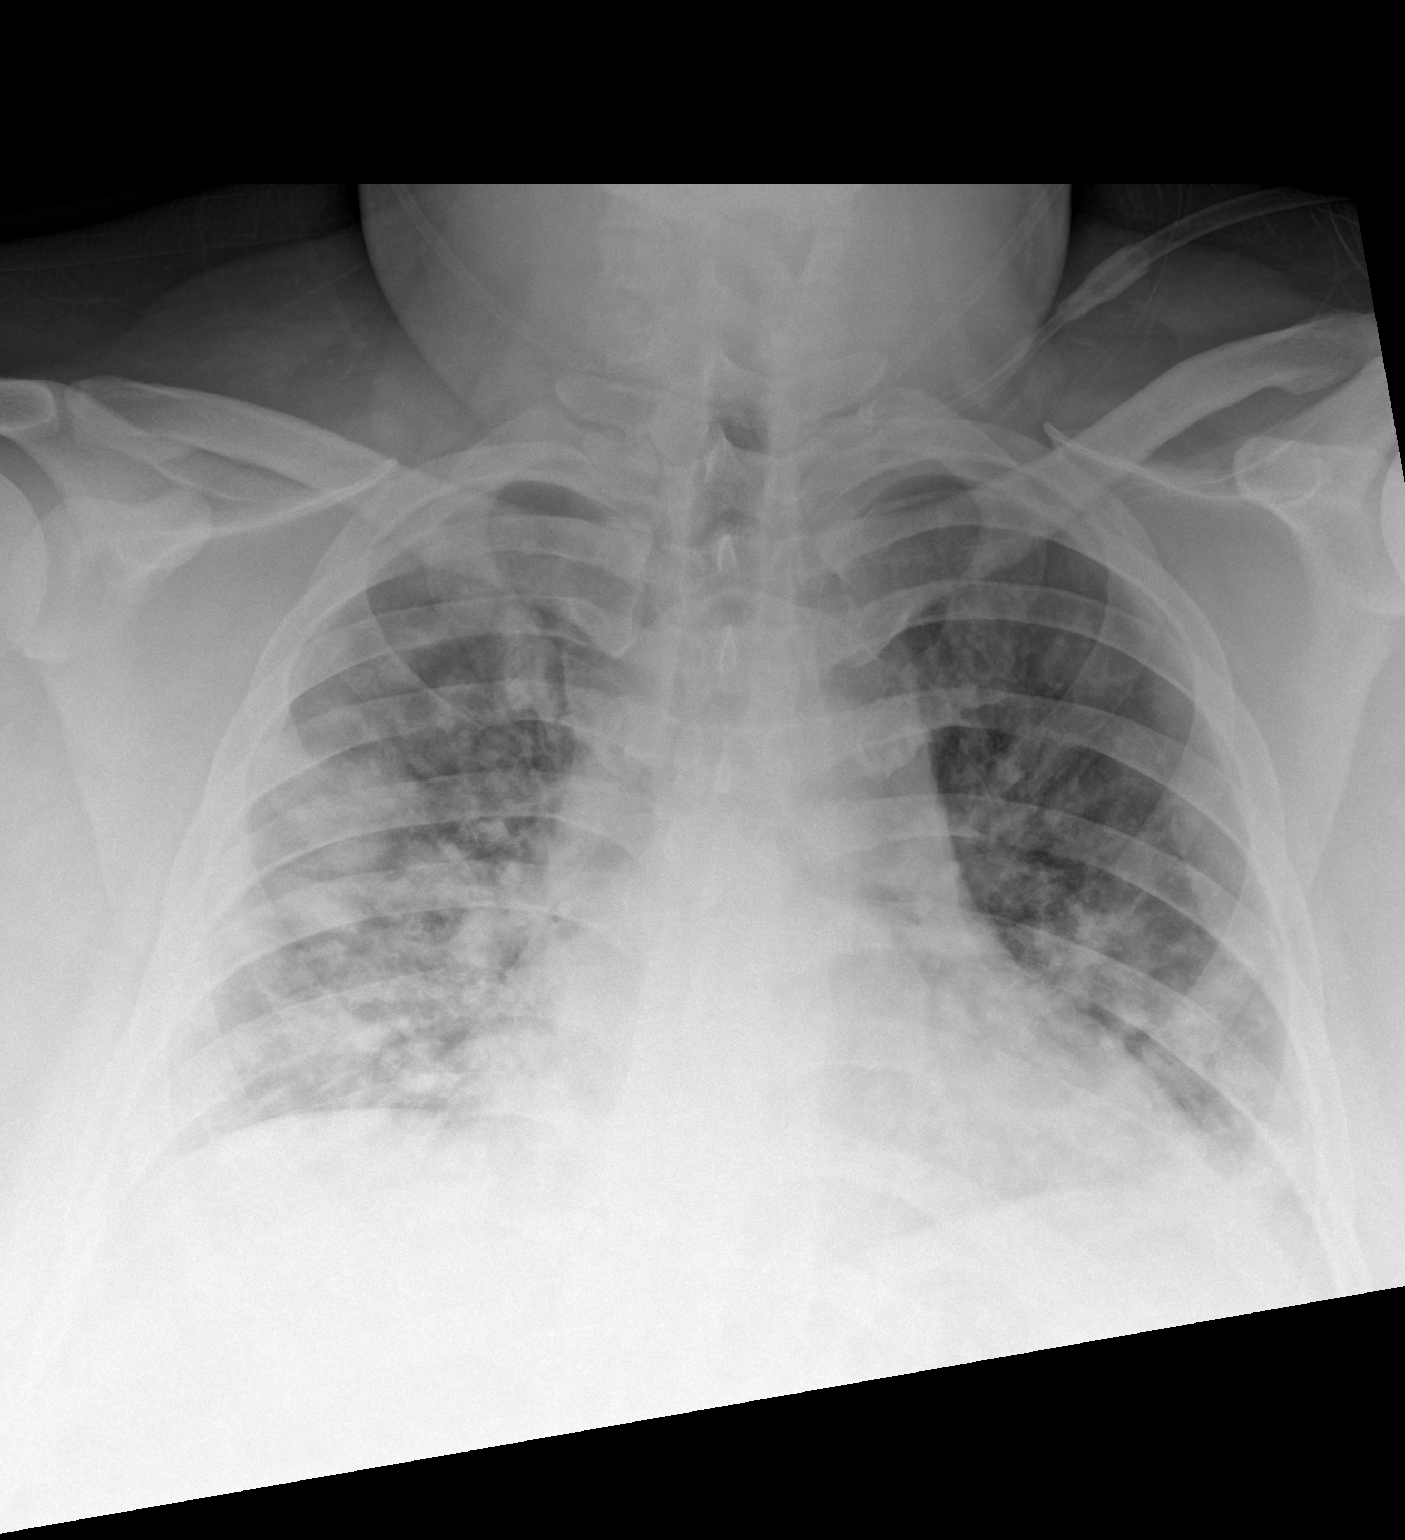

[1 of 1 positions shown; findings below may reference images not displayed]

FINDINGS: There are diffuse bilateral ground-glass airspace opacities. The
heart size is enlarged. There is no pneumothorax or significant
pleural effusion. There is no acute osseous abnormality.
IMPRESSION: Diffuse bilateral ground-glass airspace opacities consistent with
the patient's history of viral pneumonia.

## 2020-05-03 MED ORDER — ALBUTEROL SULFATE HFA 108 (90 BASE) MCG/ACT IN AERS
2.0000 | INHALATION_SPRAY | Freq: Four times a day (QID) | RESPIRATORY_TRACT | Status: DC
  Administered 2020-05-03 – 2020-05-04 (×2): 2 via RESPIRATORY_TRACT
  Filled 2020-05-03: qty 6.7

## 2020-05-03 MED ORDER — ASPIRIN EC 81 MG PO TBEC
81.0000 mg | DELAYED_RELEASE_TABLET | Freq: Every day | ORAL | Status: DC
  Administered 2020-05-03 – 2020-05-04 (×2): 81 mg via ORAL
  Filled 2020-05-03 (×3): qty 1

## 2020-05-03 MED ORDER — GUAIFENESIN-DM 100-10 MG/5ML PO SYRP
10.0000 mL | ORAL_SOLUTION | ORAL | Status: DC | PRN
  Administered 2020-05-04 – 2020-05-05 (×2): 10 mL via ORAL
  Filled 2020-05-03 (×3): qty 10

## 2020-05-03 MED ORDER — METHYLPREDNISOLONE SODIUM SUCC 125 MG IJ SOLR
125.0000 mg | Freq: Once | INTRAMUSCULAR | Status: AC
  Administered 2020-05-03: 125 mg via INTRAVENOUS
  Filled 2020-05-03: qty 2

## 2020-05-03 MED ORDER — SODIUM CHLORIDE 0.9 % IV SOLN
200.0000 mg | Freq: Once | INTRAVENOUS | Status: AC
  Administered 2020-05-03: 200 mg via INTRAVENOUS
  Filled 2020-05-03: qty 200

## 2020-05-03 MED ORDER — ASCORBIC ACID 500 MG PO TABS
500.0000 mg | ORAL_TABLET | Freq: Every day | ORAL | Status: DC
  Administered 2020-05-03 – 2020-05-04 (×2): 500 mg via ORAL
  Filled 2020-05-03 (×3): qty 1

## 2020-05-03 MED ORDER — ENOXAPARIN SODIUM 40 MG/0.4ML ~~LOC~~ SOLN
40.0000 mg | Freq: Two times a day (BID) | SUBCUTANEOUS | Status: DC
  Administered 2020-05-03 – 2020-05-26 (×46): 40 mg via SUBCUTANEOUS
  Filled 2020-05-03 (×46): qty 0.4

## 2020-05-03 MED ORDER — ZINC SULFATE 220 (50 ZN) MG PO CAPS
220.0000 mg | ORAL_CAPSULE | Freq: Every day | ORAL | Status: DC
  Administered 2020-05-03 – 2020-05-04 (×2): 220 mg via ORAL
  Filled 2020-05-03 (×3): qty 1

## 2020-05-03 MED ORDER — PANTOPRAZOLE SODIUM 40 MG IV SOLR
40.0000 mg | INTRAVENOUS | Status: DC
  Administered 2020-05-03: 40 mg via INTRAVENOUS
  Filled 2020-05-03: qty 40

## 2020-05-03 MED ORDER — HYDROCOD POLST-CPM POLST ER 10-8 MG/5ML PO SUER
5.0000 mL | Freq: Two times a day (BID) | ORAL | Status: DC | PRN
  Administered 2020-05-03 – 2020-05-05 (×3): 5 mL via ORAL
  Filled 2020-05-03 (×3): qty 5

## 2020-05-03 MED ORDER — SODIUM CHLORIDE 0.9 % IV SOLN
100.0000 mg | Freq: Every day | INTRAVENOUS | Status: AC
  Administered 2020-05-04 – 2020-05-07 (×4): 100 mg via INTRAVENOUS
  Filled 2020-05-03 (×2): qty 100
  Filled 2020-05-03 (×3): qty 20
  Filled 2020-05-03 (×2): qty 100

## 2020-05-03 MED ORDER — ACETAMINOPHEN 500 MG PO TABS
1000.0000 mg | ORAL_TABLET | Freq: Four times a day (QID) | ORAL | Status: DC | PRN
  Administered 2020-05-03 – 2020-06-06 (×24): 1000 mg via ORAL
  Filled 2020-05-03 (×25): qty 2

## 2020-05-03 NOTE — ED Notes (Signed)
Assumed care of pt, pt tachypeinic but denies sob or chest pain. Resting in recliner with 1 side rail up for comfort. Medicated per MAR. Placed on tele, bp, and o2 monitor, O2 sat decreased to 88% on 4L, placed on 6L [place] with improvement to 90-92%. AO x4. Talking in full sentences with regular and unlabored breathing.

## 2020-05-03 NOTE — ED Notes (Signed)
Pulse ox 95% on 2 liter nasal canula

## 2020-05-03 NOTE — H&P (Signed)
History and Physical    BLU LORI TML:[id number] DOB: 10-11-1985 DOA: 05/03/2020  PCP: Patient, No Pcp Per    Patient coming from: Home   Chief Complaint: SOB.   HPI: Collin Brown is a 34 y.o. male with medical history significant of OSA, in the emergency room with complaints of cough and shortness of breath and generalized weakness and fever the past few days.  Patient comes to [place] after being tested positive for next care for Covid-19.  Patient states that he thinks he was exposed to his coworker who was Covid positive however his mom has tested positive as well for the coronavirus.  Patient is alert awake and oriented and denies any other complaints of chest pains stomach issues any headaches or neurological complaints. Patient states he does not smoke drink or use any drugs.  Patient reports a family history of diabetes and high blood pressure in both parents.  And has had no history of any surgeries.  She denies any past medical history of diabetes.  ED Course:  In the emergency room vitals initially show an oxygenation of 88% on room air and was started on nasal cannula and uptitrated to current about 4 L by nasal cannula maintaining an oxygen saturation of above 90%.  Patient is mentating well. Pharmacy consulted and patient started on COVID-19 protocol with remdesivir loading dose of 200 mg.  Review of Systems: As per HPI otherwise all systems reviewed and negative.  Past Medical History:  Diagnosis Date  . Hearing loss in right ear   . Sleep apnea     Past Surgical History:  Procedure Laterality Date  . TONSILLECTOMY       reports that he has never smoked. He has never used smokeless tobacco. He reports that he does not drink alcohol and does not use drugs.  No Known Allergies  Family History  Problem Relation Age of Onset  . Diabetes Mellitus II Mother   . Hypertension Mother   . Diabetes Mellitus II Father   . High blood pressure Father     Prior to  Admission medications   Medication Sig Start Date End Date Taking? Authorizing Provider  amoxicillin-clavulanate (AUGMENTIN) 875-125 MG tablet Take 1 tablet by mouth 2 (two) times daily. 04/12/20   Collin Elm, PA-C    Physical Exam: Vitals:   05/03/20 1700 05/03/20 1730 05/03/20 1730 05/03/20 1732  BP: (!) 109/49 (!) 103/59    Pulse: (!) 101 (!) 107  (!) 106  Resp:  (!) 22    Temp:      TempSrc:      SpO2: 91% (!) 88% (!) 88% 91%  Weight:      Height:        Constitutional: NAD, calm, comfortable Vitals:   05/03/20 1700 05/03/20 1730 05/03/20 1730 05/03/20 1732  BP: (!) 109/49 (!) 103/59    Pulse: (!) 101 (!) 107  (!) 106  Resp:  (!) 22    Temp:      TempSrc:      SpO2: 91% (!) 88% (!) 88% 91%  Weight:      Height:       Eyes: EOMI lids and conjunctivae normal ENMT: Mucous membranes are moist. Posterior pharynx clear of any exudate or lesions.Normal dentition.  Neck: normal, supple, no masses, no thyromegaly, no carotid bruit  Respiratory: clear to auscultation bilaterally, with scattered wheezing.   Cardiovascular: Regular rate and rhythm, no murmurs / rubs / gallops. No extremity edema. 2+ pedal  pulses. No carotid bruits.  Abdomen: no tenderness, no masses palpated. No hepatosplenomegaly. Bowel sounds positive.  Musculoskeletal: no clubbing / cyanosis. No joint deformity upper and lower extremities. Pt moving all four ext , no contractures. Normal muscle tone.  Skin: no rashes, lesions, ulcers. No induration Neurologic: CN 2-12 grossly intact. DTR normal. Alert and oriented x 3. Normal mood.   Labs on Admission: I have personally reviewed following labs and imaging studies.  CBC: Recent Labs  Lab 05/03/20 1217  WBC 3.2*  HGB 12.8*  HCT 39.3  MCV 84.2  PLT 132*   Basic Metabolic Panel: Recent Labs  Lab 05/03/20 1217  NA 135  K 3.8  CL 98  CO2 28  GLUCOSE 125*  BUN 14  CREATININE 0.80  CALCIUM 8.3*   GFR: Estimated Creatinine  Clearance: 185.8 mL/min (by C-G formula based on SCr of 0.8 mg/dL). Liver Function Tests: Recent Labs  Lab 05/03/20 1217  AST 52*  ALT 44  ALKPHOS 51  BILITOT 0.6  PROT 6.6  ALBUMIN 3.7    Recent Labs    05/03/20 1618  TRIG 96   Thyroid Function Tests: No results for input(s): TSH, T4TOTAL, FREET4, T3FREE, THYROIDAB in the last 72 hours. Anemia Panel: Recent Labs    05/03/20 1633  FERRITIN 699*   Urine analysis:    Component Value Date/Time   COLORURINE YELLOW 05/17/2011 1035   APPEARANCEUR CLEAR 05/17/2011 1035   LABSPEC 1.020 05/17/2011 1035   PHURINE 7.5 05/17/2011 1035   GLUCOSEU NEGATIVE 05/17/2011 1035   HGBUR SMALL (A) 05/17/2011 1035   BILIRUBINUR NEGATIVE 05/17/2011 1035   KETONESUR NEGATIVE 05/17/2011 1035   PROTEINUR NEGATIVE 05/17/2011 1035   UROBILINOGEN 0.2 05/17/2011 1035   NITRITE NEGATIVE 05/17/2011 1035   LEUKOCYTESUR NEGATIVE 05/17/2011 1035   No intake or output data in the 24 hours ending 05/03/20 1836 Lab Results  Component Value Date   CREATININE 0.80 05/03/2020   CREATININE 0.84 05/17/2011   CREATININE 0.87 02/01/2009    COVID-19 Labs  Recent Labs    05/03/20 1633  FERRITIN 699*  LDH 343*    No results found for: SARSCOV2NAA  Radiological Exams on Admission: DG Chest Portable 1 View  Result Date: 05/03/2020 CLINICAL DATA:  COVID pneumonia. EXAM: PORTABLE CHEST 1 VIEW COMPARISON:  None. FINDINGS: There are diffuse bilateral ground-glass airspace opacities. The heart size is enlarged. There is no pneumothorax or significant pleural effusion. There is no acute osseous abnormality. IMPRESSION: Diffuse bilateral ground-glass airspace opacities consistent with the patient's history of viral pneumonia. Electronically Signed   By: Katherine Mantle M.D.   On: 05/03/2020 16:36    EKG: Independently reviewed.  Sinus tachycardia rate of 103 otherwise normal.  Assessment/Plan Principal Problem:   Acute hypoxemic respiratory failure  due to COVID-19 [facility]) Active Problems:   Sleep apnea Acute hypoxic respiratory failure secondary to COVID-19 pneumonia: -Admit patient to airborne and droplet isolation in the COVID-19 unit, with telemetry monitoring. -Start patient on antiviral regimen with remdesivir and Solu-Medrol, vitamin C and zinc. -As needed antitussives and inhalers. -Proning. -Supplemental oxygen as needed for goal O2 sat of 94 and above. -Daily Covid inflammatory markers to assess progression of disease. -Consider CTA chest if worsening or persistent hypoxia.  Obstructive sleep apnea: -AutoPap nightly per home settings.    DVT prophylaxis:  Lovenox weight adjusted. Code Status:  Full code  Family Communication:  None at bedside patient lives with mother.  Disposition Plan:  Discharge home in the next 24 hours  if oxygen requirement remains the same, with 6-minute walk test on home oxygen supplementation.   Consults called:  None  Admission status: Inpatient  Status is: Inpatient  Remains inpatient appropriate because:Inpatient level of care appropriate due to severity of illness   Dispo: The patient is from: Home              Anticipated d/c is to: Home              Anticipated d/c date is: 3 days              Patient currently is not medically stable to d/c.    Gertha Calkin MD [facility] Pager [phone] If 7PM-7AM, please contact night-coverage [email or web address] Password [facility] 05/03/2020, 6:36 PM

## 2020-05-03 NOTE — Progress Notes (Signed)
Remdesivir - Pharmacy Brief Note   O:  ALT: 44 CXR: Diffuse bilateral ground-glass airspace opacities  SpO2: 86% RA, 95% on 2L [place]   A/P:  Remdesivir 200 mg IVPB once followed by 100 mg IVPB daily x 4 days.   Gardner Candle, PharmD, BCPS Clinical Pharmacist 05/03/2020 5:15 PM

## 2020-05-03 NOTE — ED Triage Notes (Signed)
Came from urgent care for low oxygen sat --per staff low 80s.  Has been sick for 2 days and is postive for covid.

## 2020-05-03 NOTE — ED Notes (Signed)
Pt de-sat to 84-85% with 6L [place], pt placed in prone position. O2 sat 91% 6L [place] with prone position, call placed in reach of pt, denies complaints. AO x4

## 2020-05-03 NOTE — ED Notes (Signed)
Pt oxygen increased to 4L at this time. Oxygen saturation now 91% on 4L. MD Siadecki aware

## 2020-05-03 NOTE — ED Provider Notes (Signed)
[facility] Emergency Department Provider Note ____________________________________________   First MD Initiated Contact with Patient 05/03/20 1609     (approximate)  I have reviewed the triage vital signs and the nursing notes.   HISTORY  Chief Complaint Cough and Shortness of Breath    HPI MOATAZ TAVIS is a 34 y.o. male with PMH as noted below who presents with symptoms related to COVID-19, gradual onset over the last 2 to 3 days, and characterized by shortness of breath, generalized weakness, and fever.  The patient states that he had a positive COVID-19 test today.  He states that a coworker tested positive for Covid recently.  He reports cough and some sharp chest pain associated with it, but no persistent pain.  He has nausea and decreased appetite but no vomiting or diarrhea.  Past Medical History:  Diagnosis Date  . Hearing loss in right ear   . Sleep apnea     Patient Active Problem List   Diagnosis Date Noted  . Acute hypoxemic respiratory failure due to COVID-19 [facility]) 05/03/2020    Past Surgical History:  Procedure Laterality Date  . TONSILLECTOMY      Prior to Admission medications   Medication Sig Start Date End Date Taking? Authorizing Provider  amoxicillin-clavulanate (AUGMENTIN) 875-125 MG tablet Take 1 tablet by mouth 2 (two) times daily. 04/12/20   Rodriguez-Southworth, Nettie Elm, PA-C    Allergies Patient has no known allergies.  No family history on file.  Social History Social History   Tobacco Use  . Smoking status: Never Smoker  . Smokeless tobacco: Never Used  Vaping Use  . Vaping Use: Never used  Substance Use Topics  . Alcohol use: No  . Drug use: No    Review of Systems  Constitutional: Positive for fever. Eyes: No redness. ENT: No sore throat. Cardiovascular: Positive for intermittent chest pain. Respiratory: Positive for shortness of breath. Gastrointestinal: No vomiting or diarrhea.  Genitourinary:  Negative for dysuria.  Musculoskeletal: Negative for back pain. Skin: Negative for rash. Neurological: Negative for headache.   ____________________________________________   PHYSICAL EXAM:  VITAL SIGNS: ED Triage Vitals [05/03/20 1213]  Enc Vitals Group     BP 129/81     Pulse Rate (!) 107     Resp 18     Temp (!) 100.4 F (38 C)     Temp Source Oral     SpO2 (!) 86 %     Weight (!) 310 lb (140.6 kg)     Height 5\' 10"  (1.778 m)     Head Circumference      Peak Flow      Pain Score 5     Pain Loc      Pain Edu?      Excl. in GC?     Constitutional: Alert and oriented.  Relatively well appearing, in no acute distress. Eyes: Conjunctivae are normal.  Head: Atraumatic. Nose: No congestion/rhinnorhea. Mouth/Throat: Mucous membranes are moist.   Neck: Normal range of motion.  Cardiovascular: Normal rate, regular rhythm.  Good peripheral circulation. Respiratory: Minimally increased respiratory effort.  No retractions.  Gastrointestinal:  No distention.  Musculoskeletal: Extremities warm and well perfused.  Neurologic:  Normal speech and language. No gross focal neurologic deficits are appreciated.  Skin:  Skin is warm and dry. No rash noted. Psychiatric: Mood and affect are normal. Speech and behavior are normal.  ____________________________________________   LABS (all labs ordered are listed, but only abnormal results are displayed)  Labs  Reviewed  BASIC METABOLIC PANEL - Abnormal; Notable for the following components:      Result Value   Glucose, Bld 125 (*)    Calcium 8.3 (*)    All other components within normal limits  CBC - Abnormal; Notable for the following components:   WBC 3.2 (*)    Hemoglobin 12.8 (*)    Platelets 132 (*)    All other components within normal limits  HEPATIC FUNCTION PANEL - Abnormal; Notable for the following components:   AST 52 (*)    All other components within normal limits  CULTURE, BLOOD (ROUTINE X 2)  CULTURE, BLOOD  (ROUTINE X 2)  RESPIRATORY PANEL BY PCR  PROCALCITONIN  LACTIC ACID, PLASMA  TRIGLYCERIDES  BRAIN NATRIURETIC PEPTIDE  URINALYSIS, COMPLETE (UACMP) WITH MICROSCOPIC  LACTIC ACID, PLASMA  FIBRIN DERIVATIVES D-DIMER ([facility] ONLY)  LACTATE DEHYDROGENASE  FERRITIN  FIBRINOGEN  C-REACTIVE PROTEIN  HIV ANTIBODY (ROUTINE TESTING W REFLEX)  CBC  CREATININE, SERUM  CBC WITH DIFFERENTIAL/PLATELET  C-REACTIVE PROTEIN  FIBRIN DERIVATIVES D-DIMER ([facility] ONLY)  FERRITIN  TROPONIN I (HIGH SENSITIVITY)   ____________________________________________  EKG  ED ECG REPORT I, Dionne Bucy, the attending physician, personally viewed and interpreted this ECG.  Date: 05/03/2020 EKG Time: 1224 Rate: 103 Rhythm: Sinus tachycardia QRS Axis: normal Intervals: normal ST/T Wave abnormalities: normal Narrative Interpretation: no evidence of acute ischemia  ____________________________________________  RADIOLOGY  Chest x-ray interpreted by me shows diffuse bilateral opacities consistent with COVID-19   ____________________________________________   PROCEDURES  Procedure(s) performed: No  Procedures  Critical Care performed: No ____________________________________________   INITIAL IMPRESSION / ASSESSMENT AND PLAN / ED COURSE  Pertinent labs & imaging results that were available during my care of the patient were reviewed by me and considered in my medical decision making (see chart for details).  34 year old male with a history of sleep apnea but otherwise no active medical problems presents with shortness of breath, fever, and generalized weakness over the last several days, with a positive diagnosis for COVID-19 today.  On exam, the patient is overall relatively well-appearing.  He is febrile and slightly tachypneic.  O2 saturation is in the low to mid 80s on room air, and in the mid 90s on 3 L by nasal cannula.  Exam is otherwise as described below.  Overall presentation is  consistent with COVID-19.  We will obtain a chest x-ray, lab work-up, and reassess.  However, given the oxygen requirement, the patient will require admission.  ----------------------------------------- 5:40 PM on 05/03/2020 -----------------------------------------  Chest x-ray confirms findings compatible with COVID-19.  I have ordered remdesivir and steroid.  I discussed the case with Dr. Allena Katz from the hospitalist service for admission.  ____________________________________________   FINAL CLINICAL IMPRESSION(S) / ED DIAGNOSES  Final diagnoses:  COVID-19  Acute respiratory failure with hypoxia (HCC)      NEW MEDICATIONS STARTED DURING THIS VISIT:  New Prescriptions   No medications on file     Note:  This document was prepared using Dragon voice recognition software and may include unintentional dictation errors.    Dionne Bucy, MD 05/03/20 [phone]

## 2020-05-04 DIAGNOSIS — J1282 Pneumonia due to coronavirus disease 2019: Secondary | ICD-10-CM | POA: Diagnosis present

## 2020-05-04 DIAGNOSIS — U071 COVID-19: Secondary | ICD-10-CM | POA: Diagnosis present

## 2020-05-04 DIAGNOSIS — J9601 Acute respiratory failure with hypoxia: Secondary | ICD-10-CM | POA: Diagnosis not present

## 2020-05-04 HISTORY — DX: COVID-19: U07.1

## 2020-05-04 HISTORY — DX: Pneumonia due to coronavirus disease 2019: J12.82

## 2020-05-04 LAB — FIBRIN DERIVATIVES D-DIMER (ARMC ONLY): Fibrin derivatives D-dimer (ARMC): 1107.89 ng/mL (FEU) — ABNORMAL HIGH (ref 0.00–499.00)

## 2020-05-04 LAB — CBC WITH DIFFERENTIAL/PLATELET
Abs Immature Granulocytes: 0.01 10*3/uL (ref 0.00–0.07)
Basophils Absolute: 0 10*3/uL (ref 0.0–0.1)
Basophils Relative: 0 %
Eosinophils Absolute: 0 10*3/uL (ref 0.0–0.5)
Eosinophils Relative: 0 %
HCT: 42.9 % (ref 39.0–52.0)
Hemoglobin: 13.9 g/dL (ref 13.0–17.0)
Immature Granulocytes: 0 %
Lymphocytes Relative: 17 %
Lymphs Abs: 0.5 10*3/uL — ABNORMAL LOW (ref 0.7–4.0)
MCH: 27.3 pg (ref 26.0–34.0)
MCHC: 32.4 g/dL (ref 30.0–36.0)
MCV: 84.3 fL (ref 80.0–100.0)
Monocytes Absolute: 0.1 10*3/uL (ref 0.1–1.0)
Monocytes Relative: 3 %
Neutro Abs: 2.3 10*3/uL (ref 1.7–7.7)
Neutrophils Relative %: 80 %
Platelets: 142 10*3/uL — ABNORMAL LOW (ref 150–400)
RBC: 5.09 MIL/uL (ref 4.22–5.81)
RDW: 15.3 % (ref 11.5–15.5)
Smear Review: NORMAL
WBC: 2.9 10*3/uL — ABNORMAL LOW (ref 4.0–10.5)
nRBC: 0 % (ref 0.0–0.2)

## 2020-05-04 LAB — GLUCOSE, CAPILLARY: Glucose-Capillary: 212 mg/dL — ABNORMAL HIGH (ref 70–99)

## 2020-05-04 LAB — HEMOGLOBIN A1C
Hgb A1c MFr Bld: 7.2 % — ABNORMAL HIGH (ref 4.8–5.6)
Mean Plasma Glucose: 159.94 mg/dL

## 2020-05-04 MED ORDER — METHYLPREDNISOLONE SODIUM SUCC 125 MG IJ SOLR
60.0000 mg | Freq: Two times a day (BID) | INTRAMUSCULAR | Status: DC
  Administered 2020-05-04 – 2020-05-05 (×4): 60 mg via INTRAVENOUS
  Filled 2020-05-04 (×4): qty 2

## 2020-05-04 MED ORDER — ALBUTEROL SULFATE HFA 108 (90 BASE) MCG/ACT IN AERS
2.0000 | INHALATION_SPRAY | RESPIRATORY_TRACT | Status: DC | PRN
  Administered 2020-05-04 (×3): 2 via RESPIRATORY_TRACT
  Filled 2020-05-04: qty 6.7

## 2020-05-04 MED ORDER — IPRATROPIUM-ALBUTEROL 20-100 MCG/ACT IN AERS
1.0000 | INHALATION_SPRAY | Freq: Four times a day (QID) | RESPIRATORY_TRACT | Status: DC
  Administered 2020-05-04 – 2020-05-05 (×5): 1 via RESPIRATORY_TRACT
  Filled 2020-05-04: qty 4

## 2020-05-04 MED ORDER — PANTOPRAZOLE SODIUM 40 MG PO TBEC
40.0000 mg | DELAYED_RELEASE_TABLET | Freq: Every day | ORAL | Status: DC
  Administered 2020-05-04: 40 mg via ORAL
  Filled 2020-05-04: qty 1

## 2020-05-04 MED ORDER — BARICITINIB 2 MG PO TABS
4.0000 mg | ORAL_TABLET | Freq: Every day | ORAL | Status: DC
  Filled 2020-05-04 (×2): qty 2

## 2020-05-04 NOTE — ED Notes (Signed)
Pt provided with urinal at this time

## 2020-05-04 NOTE — ED Notes (Signed)
Pt repositioned into hospital bed vs ED liter. Pt placed in sitting position in bed, remains on 15L NRB, denies cp states intermittent sob with exersion. Resting with eyes closed, awakens to RN voice. Call bell within reach, side rails up x3.

## 2020-05-04 NOTE — Progress Notes (Signed)
PHARMACIST - PHYSICIAN COMMUNICATION  DR:   Fran Lowes  CONCERNING: IV to Oral Route Change Policy  RECOMMENDATION: This patient is receiving Pantoprazole by the intravenous route.  Based on criteria approved by the Pharmacy and Therapeutics Committee, the intravenous medication(s) is/are being converted to the equivalent oral dose form(s).   DESCRIPTION: These criteria include:  The patient is eating (either orally or via tube) and/or has been taking other orally administered medications for a least 24 hours  The patient has no evidence of active gastrointestinal bleeding or impaired GI absorption (gastrectomy, short bowel, patient on TNA or NPO).  If you have questions about this conversion, please contact the Pharmacy Department  []   [phone] )  ( [phone] [x]   [phone] )  [facility] []   [phone] )  [facility] []   [phone] )  [facility] []   [phone] )  [facility]   ( [phone], [facility] 05/04/2020 1:51 PM

## 2020-05-04 NOTE — ED Notes (Signed)
Admit MD Fran Lowes requested pt to be tried on high flow. RT called at this time and on way to come and place pt on high flow.

## 2020-05-04 NOTE — Progress Notes (Signed)
PROGRESS NOTE    Collin Brown  TFT:[id number] DOB: [birth year] DOA: 05/03/2020 PCP: Patient, No Pcp Per    Assessment & Plan:   Principal Problem:   Acute hypoxemic respiratory failure due to COVID-19 [facility]) Active Problems:   Sleep apnea   Pneumonia due to COVID-19 virus    Collin Brown is a 34 y.o. male with medical history significant of OSA, in the emergency room with complaints of cough and shortness of breath and generalized weakness and fever the past few days.  Patient comes to [place] after being tested positive for next care for Covid-19.   Acute hypoxic respiratory failure secondary to COVID-19 pneumonia: --In the emergency room vitals initially showed an oxygenation of 88% on room air and was started on 4L nasal cannula, however, O2 requirement rapidly increased to heated hf max setting.  Pt was therefore admitted to progressive unit. PLAN: --continue Remdesivir --continue steroid as solumedrol 60 mg BID --trend CRP --Combivent QID --Add baricitinib today --Permissive hypoxemia with goal O2 >=85%  Obstructive sleep apnea: --will hold home CPAP due to COVID  Obesity BMI 44    DVT prophylaxis: Lovenox SQ Code Status: Full code  Family Communication:  Status is: inpatient Dispo:   The patient is from: home Anticipated d/c is to: home Anticipated d/c date is: >3 days Patient currently is not medically stable to d/c due to: COVID with high O2 requirement on heated hf   Subjective and Interval History:  Pt reported coughing and DOE, but not in respiratory distress, even though O2 requirement increased to heated fh max setting.   Objective: Vitals:   05/04/20 1245 05/04/20 1330 05/04/20 1400 05/04/20 1523  BP:  120/75 129/87 119/76  Pulse:  96 (!) 102 (!) 101  Resp:    (!) 21  Temp:      TempSrc:      SpO2: 90% (!) 89% (!) 88% 91%  Weight:      Height:        Intake/Output Summary (Last 24 hours) at 05/04/2020 1713 Last data filed at 05/04/2020 1137  Gross per 24 hour  Intake 350 ml  Output 700 ml  Net -350 ml   Filed Weights   05/03/20 1213 05/04/20 0852  Weight: (!) 140.6 kg (!) 140.6 kg    Examination:   Constitutional: NAD, AAOx3 HEENT: conjunctivae and lids normal, EOMI CV: RRR no M,R,G. Distal pulses +2.  No cyanosis.   RESP: on heated hf, 100%, 50L GI: +BS, NTND Extremities: No effusions, edema, or tenderness in BLE SKIN: warm, dry and intact Neuro: II - XII grossly intact.  Sensation intact Psych: Normal mood and affect.  Appropriate judgement and reason   Data Reviewed: I have personally reviewed following labs and imaging studies  CBC: Recent Labs  Lab 05/03/20 1217 05/03/20 1932 05/04/20 0530  WBC 3.2* 3.6* 2.9*  NEUTROABS  --   --  2.3  HGB 12.8* 13.0 13.9  HCT 39.3 39.3 42.9  MCV 84.2 84.0 84.3  PLT 132* 127* 142*   Basic Metabolic Panel: Recent Labs  Lab 05/03/20 1217 05/03/20 1932  NA 135  --   K 3.8  --   CL 98  --   CO2 28  --   GLUCOSE 125*  --   BUN 14  --   CREATININE 0.80 0.84  CALCIUM 8.3*  --    GFR: Estimated Creatinine Clearance: 176.9 mL/min (by C-G formula based on SCr of 0.84 mg/dL). Liver Function Tests: Recent Labs  Lab 05/03/20 1217  AST 52*  ALT 44  ALKPHOS 51  BILITOT 0.6  PROT 6.6  ALBUMIN 3.7   No results for input(s): LIPASE, AMYLASE in the last 168 hours. No results for input(s): AMMONIA in the last 168 hours. Coagulation Profile: No results for input(s): INR, PROTIME in the last 168 hours. Cardiac Enzymes: No results for input(s): CKTOTAL, CKMB, CKMBINDEX, TROPONINI in the last 168 hours. BNP (last 3 results) No results for input(s): PROBNP in the last 8760 hours. HbA1C: Recent Labs    05/03/20 1217  HGBA1C 7.2*   CBG: No results for input(s): GLUCAP in the last 168 hours. Lipid Profile: Recent Labs    05/03/20 1618  TRIG 96   Thyroid Function Tests: Recent Labs    05/03/20 1932  TSH 0.708  FREET4 0.74   Anemia Panel: Recent Labs     05/03/20 1633  FERRITIN 699*   Sepsis Labs: Recent Labs  Lab 05/03/20 1217 05/03/20 1618 05/03/20 1932  PROCALCITON 0.14  --   --   LATICACIDVEN  --  1.0 0.8    Recent Results (from the past 240 hour(s))  Blood Culture (routine x 2)     Status: None (Preliminary result)   Collection Time: 05/03/20  4:33 PM   Specimen: BLOOD  Result Value Ref Range Status   Specimen Description BLOOD BLOOD LEFT HAND  Final   Special Requests   Final    BOTTLES DRAWN AEROBIC AND ANAEROBIC Blood Culture results may not be optimal due to an inadequate volume of blood received in culture bottles   Culture   Final    NO GROWTH < 24 HOURS Performed at [facility], [address] [place], [place] [address]    Report Status PENDING  Incomplete  Blood Culture (routine x 2)     Status: None (Preliminary result)   Collection Time: 05/03/20  4:33 PM   Specimen: BLOOD  Result Value Ref Range Status   Specimen Description BLOOD BLOOD RIGHT HAND  Final   Special Requests   Final    BOTTLES DRAWN AEROBIC AND ANAEROBIC Blood Culture adequate volume   Culture   Final    NO GROWTH < 24 HOURS Performed at [facility], [address], [place], [place] [address]    Report Status PENDING  Incomplete  Respiratory Panel by RT PCR (Flu A&B, Covid) -     Status: Abnormal   Collection Time: 05/03/20  7:32 PM  Result Value Ref Range Status   SARS Coronavirus 2 by RT PCR POSITIVE (A) NEGATIVE Final    Comment: RESULT CALLED TO, READ BACK BY AND VERIFIED WITH: Benson Norway RN 2114 05/03/20 HNM/AR    Influenza A by PCR NEGATIVE NEGATIVE Final   Influenza B by PCR NEGATIVE NEGATIVE Final    Comment: Performed at [facility], [address] [place], [place] [address]      Radiology Studies: DG Chest Portable 1 View  Result Date: 05/03/2020 CLINICAL DATA:  COVID pneumonia. EXAM: PORTABLE CHEST 1 VIEW COMPARISON:  None. FINDINGS: There are diffuse bilateral ground-glass  airspace opacities. The heart size is enlarged. There is no pneumothorax or significant pleural effusion. There is no acute osseous abnormality. IMPRESSION: Diffuse bilateral ground-glass airspace opacities consistent with the patient's history of viral pneumonia. Electronically Signed   By: Katherine Mantle M.D.   On: 05/03/2020 16:36     Scheduled Meds: . vitamin C  500 mg Oral Daily  . aspirin EC  81  mg Oral Daily  . baricitinib  4 mg Oral Daily  . enoxaparin (LOVENOX) injection  40 mg Subcutaneous BID  . Ipratropium-Albuterol  1 puff Inhalation QID  . methylPREDNISolone (SOLU-MEDROL) injection  60 mg Intravenous BID  . pantoprazole  40 mg Oral Daily  . zinc sulfate  220 mg Oral Daily   Continuous Infusions: . remdesivir 100 mg in NS 100 mL Stopped (05/04/20 1137)     LOS: 1 day     Darlin Priestly, MD [facility] If 7PM-7AM, please contact night-coverage 05/04/2020, 5:13 PM

## 2020-05-04 NOTE — ED Notes (Signed)
Pt's oxygen saturation dropped to 83% while on 50L HFNC. RT placed 15LNRB on patient which increased o2 sat to 87%. MD notified who will allow o2 sat as low as 85%.

## 2020-05-04 NOTE — ED Notes (Signed)
Attempted to call floor for pt's room assignment. Per Lesle Reek the MD has been messaged because they feel pt needs higher level of care. States they are waiting for orders from Admit MD Fran Lowes on next steps.  Will inform Charge Marylene Land of situation.

## 2020-05-04 NOTE — Progress Notes (Signed)
RT to patient bedside for CPAP QHS placement. Patient on HHFNC 50L 100% + 15L NRBM with SAT at 79%. Patient placed on CPAP 15cmH2O with SATs improving to 85%. Patient states he is able to sleep on stomach, patient proned at this time with SATS improving to 95%. Patient resting comfortably in bed on CPAP at this time. Will continue to monitor.

## 2020-05-05 ENCOUNTER — Inpatient Hospital Stay: Payer: BC Managed Care – PPO

## 2020-05-05 DIAGNOSIS — U071 COVID-19: Principal | ICD-10-CM

## 2020-05-05 DIAGNOSIS — J9601 Acute respiratory failure with hypoxia: Secondary | ICD-10-CM

## 2020-05-05 LAB — BASIC METABOLIC PANEL
Anion gap: 8 (ref 5–15)
BUN: 20 mg/dL (ref 6–20)
CO2: 30 mmol/L (ref 22–32)
Calcium: 8.6 mg/dL — ABNORMAL LOW (ref 8.9–10.3)
Chloride: 103 mmol/L (ref 98–111)
Creatinine, Ser: 0.8 mg/dL (ref 0.61–1.24)
GFR calc Af Amer: >60 mL/min (ref >60)
GFR calc non Af Amer: >60 mL/min (ref >60)
Glucose, Bld: 214 mg/dL — ABNORMAL HIGH (ref 70–99)
Potassium: 4.2 mmol/L (ref 3.5–5.1)
Sodium: 141 mmol/L (ref 135–145)

## 2020-05-05 LAB — CBC
HCT: 42.3 % (ref 39.0–52.0)
Hemoglobin: 13.5 g/dL (ref 13.0–17.0)
MCH: 27.4 pg (ref 26.0–34.0)
MCHC: 31.9 g/dL (ref 30.0–36.0)
MCV: 86 fL (ref 80.0–100.0)
Platelets: 168 10*3/uL (ref 150–400)
RBC: 4.92 MIL/uL (ref 4.22–5.81)
RDW: 15.3 % (ref 11.5–15.5)
WBC: 6.2 10*3/uL (ref 4.0–10.5)
nRBC: 0 % (ref 0.0–0.2)

## 2020-05-05 LAB — TRIGLYCERIDES: Triglycerides: 218 mg/dL — ABNORMAL HIGH (ref <150)

## 2020-05-05 LAB — MAGNESIUM: Magnesium: 2.5 mg/dL — ABNORMAL HIGH (ref 1.7–2.4)

## 2020-05-05 LAB — C-REACTIVE PROTEIN: CRP: 7.3 mg/dL — ABNORMAL HIGH (ref <1.0)

## 2020-05-05 LAB — GLUCOSE, CAPILLARY: Glucose-Capillary: 193 mg/dL — ABNORMAL HIGH (ref 70–99)

## 2020-05-05 LAB — FIBRIN DERIVATIVES D-DIMER (ARMC ONLY): Fibrin derivatives D-dimer (ARMC): 901.82 ng/mL (FEU) — ABNORMAL HIGH (ref 0.00–499.00)

## 2020-05-05 LAB — FERRITIN: Ferritin: 919 ng/mL — ABNORMAL HIGH (ref 24–336)

## 2020-05-05 IMAGING — DX DG ABDOMEN 1V
1 series · 1 of 1 positions shown · non-contrast
Comparison: None.

CLINICAL DATA: OG tube placement

EXAM:
ABDOMEN - 1 VIEW

[abdomen supine]
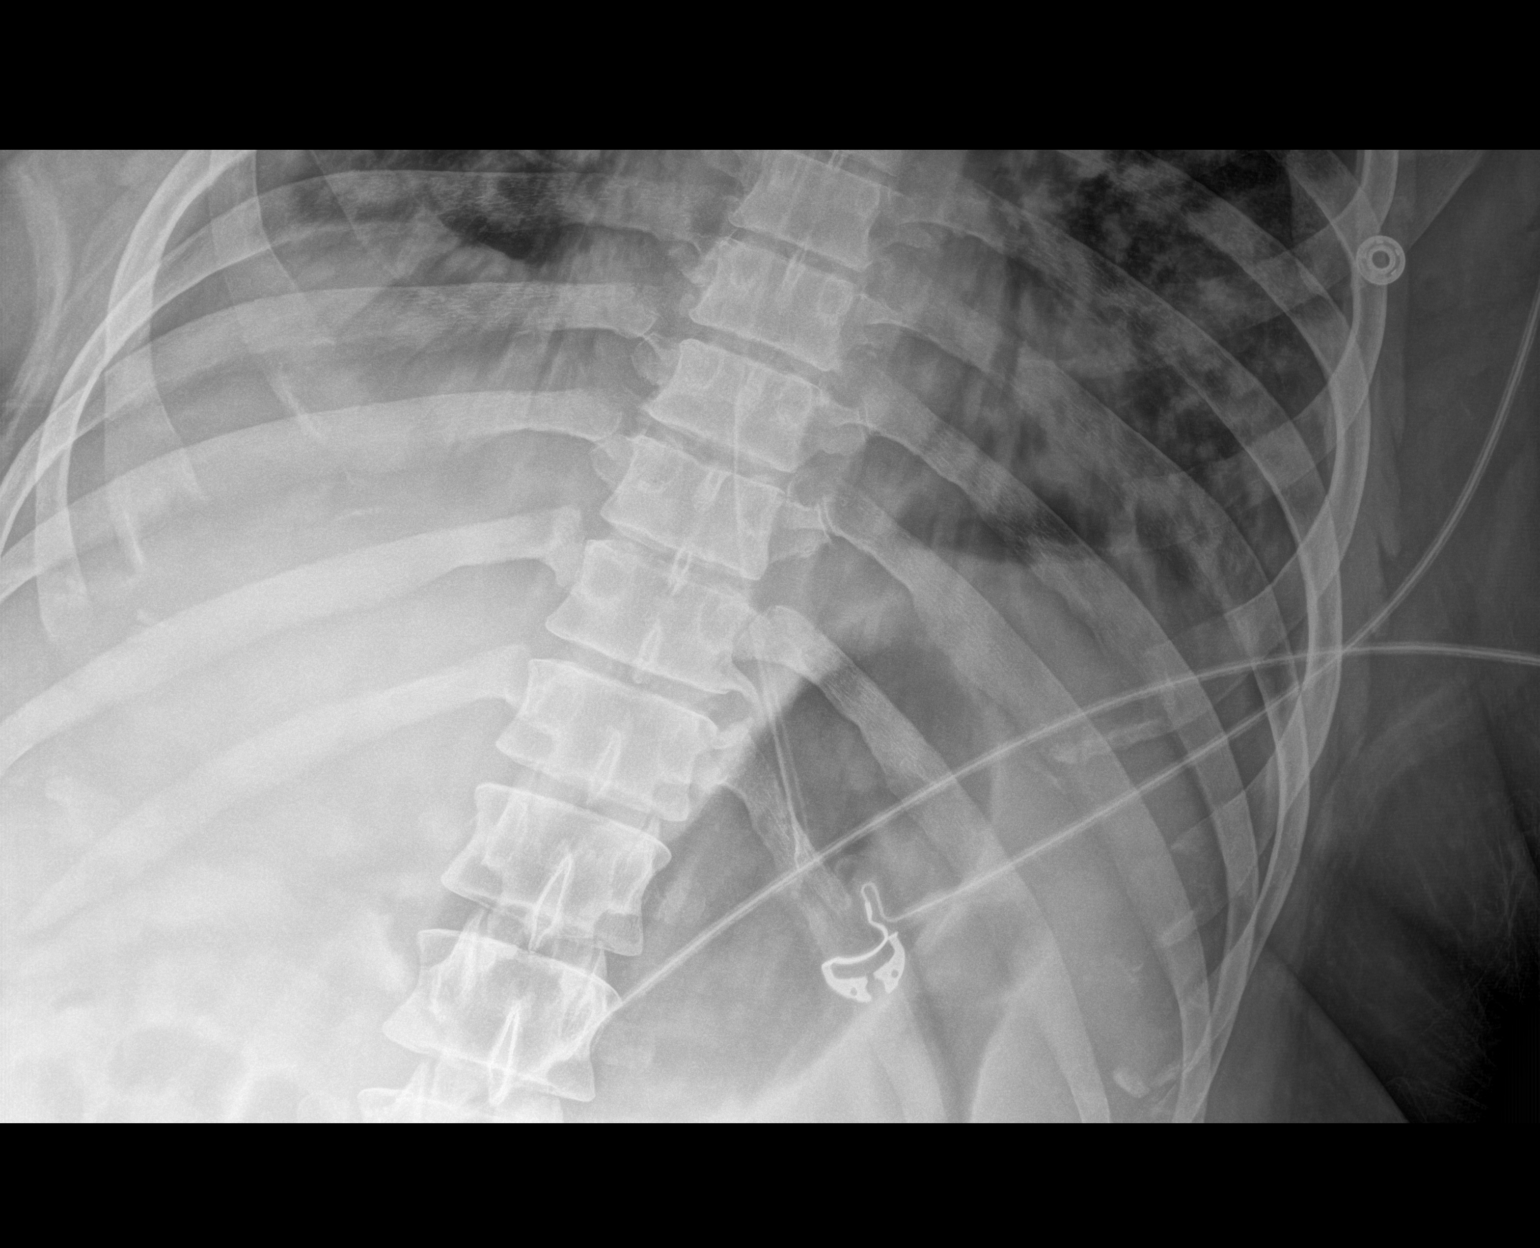

[1 of 1 positions shown; findings below may reference images not displayed]

FINDINGS: An enteric tube is present with tip in the left upper quadrant
consistent with location in the upper stomach. Diffuse infiltrates
in the lung bases.
IMPRESSION: Enteric tube tip is in the left upper quadrant consistent with
location in the upper stomach.

## 2020-05-05 IMAGING — DX DG CHEST 1V PORT
1 series · 2 of 2 positions shown · non-contrast
Comparison: [DATE]

CLINICAL DATA: Central line placement

EXAM:
PORTABLE CHEST 1 VIEW

[Series 1: chest pa · 0.14mm/px · 2 of 2 slices shown]
[im 1/2]
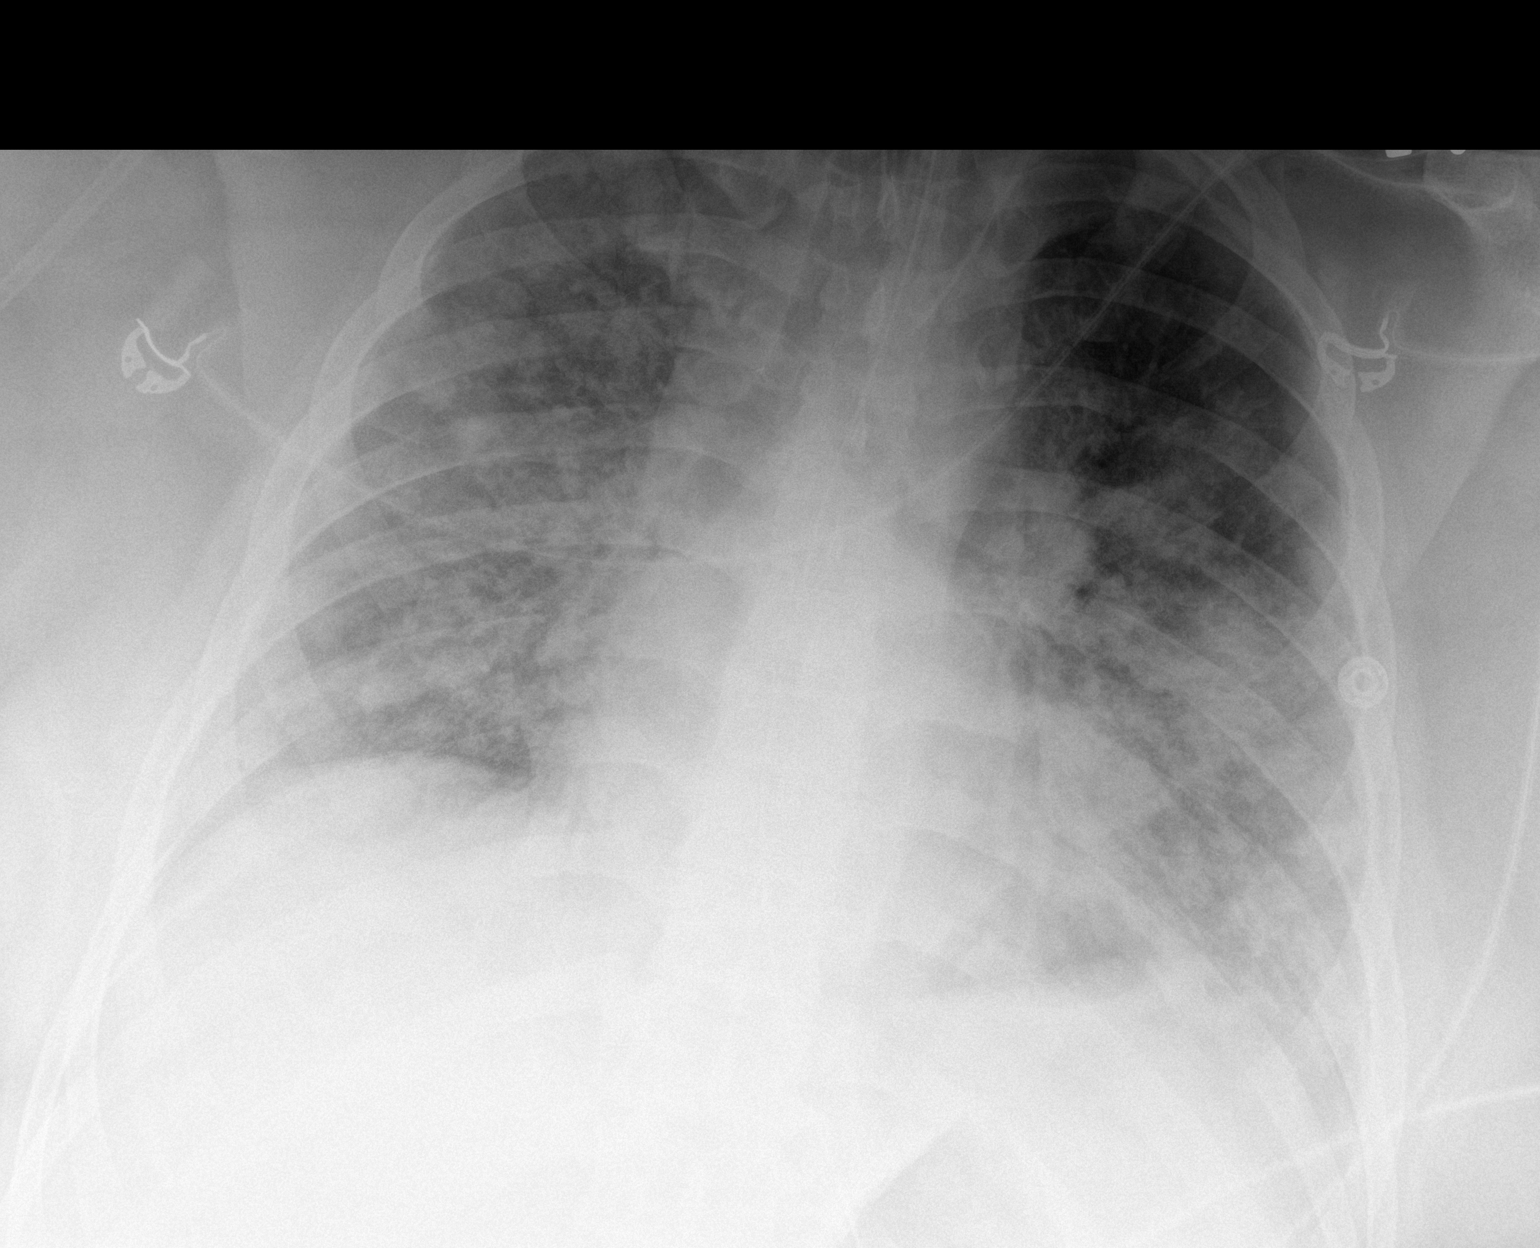
[im 2/2]
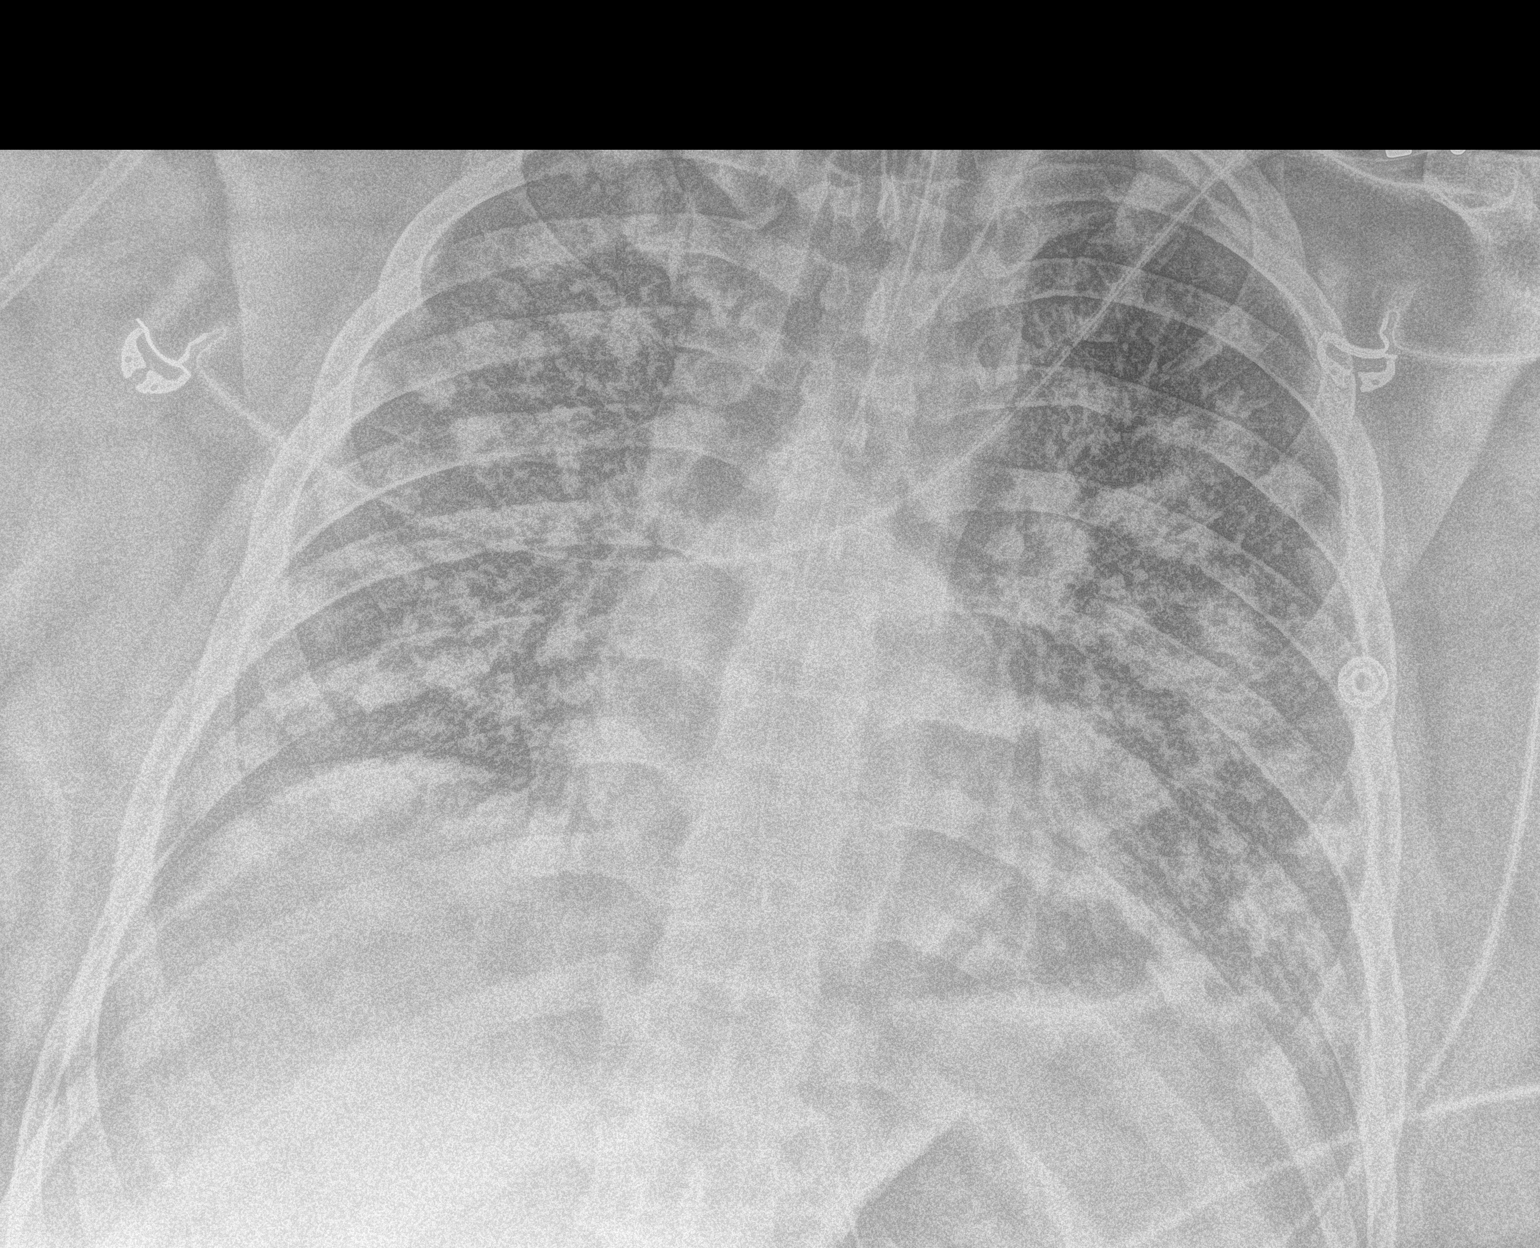

[2 of 2 positions shown; findings below may reference images not displayed]

FINDINGS: Endotracheal tube tip measures 4.7 cm above the carina. Enteric tube
is present with tip off the field of view but below the left
hemidiaphragm. A left central venous catheter with tip in the left
upper mediastinum consistent with location in the bronchi of
cephalic vein. No change in location or appearance of the
appliances.

Borderline heart size. Diffuse nodular infiltration throughout both
lungs. No pleural effusions. No pneumothorax. No change since prior
study.
IMPRESSION: Appliances remain unchanged in position. Persistent diffuse nodular
infiltration throughout both lungs. No change since prior study.

## 2020-05-05 MED ORDER — VANCOMYCIN HCL IN DEXTROSE 1-5 GM/200ML-% IV SOLN
1000.0000 mg | Freq: Three times a day (TID) | INTRAVENOUS | Status: DC
  Administered 2020-05-06 – 2020-05-07 (×3): 1000 mg via INTRAVENOUS
  Filled 2020-05-05 (×6): qty 200

## 2020-05-05 MED ORDER — CHLORHEXIDINE GLUCONATE CLOTH 2 % EX PADS
6.0000 | MEDICATED_PAD | Freq: Every day | CUTANEOUS | Status: DC
  Administered 2020-05-05 – 2020-05-27 (×22): 6 via TOPICAL

## 2020-05-05 MED ORDER — ASCORBIC ACID 500 MG PO TABS
500.0000 mg | ORAL_TABLET | Freq: Every day | ORAL | Status: DC
  Administered 2020-05-06 – 2020-05-23 (×16): 500 mg
  Filled 2020-05-05 (×16): qty 1

## 2020-05-05 MED ORDER — BARICITINIB 2 MG PO TABS: 4.0000 mg | ORAL_TABLET | Freq: Every day | ORAL | Status: DC

## 2020-05-05 MED ORDER — PROPOFOL 1000 MG/100ML IV EMUL
INTRAVENOUS | Status: AC
  Filled 2020-05-05: qty 100

## 2020-05-05 MED ORDER — FENTANYL 2500MCG IN NS 250ML (10MCG/ML) PREMIX INFUSION
0.0000 ug/h | INTRAVENOUS | Status: DC
  Administered 2020-05-06 – 2020-05-07 (×7): 400 ug/h via INTRAVENOUS
  Filled 2020-05-05 (×7): qty 250

## 2020-05-05 MED ORDER — MIDAZOLAM HCL 2 MG/2ML IJ SOLN
4.0000 mg | Freq: Once | INTRAMUSCULAR | Status: AC
  Administered 2020-05-05: 4 mg via INTRAVENOUS

## 2020-05-05 MED ORDER — VANCOMYCIN HCL 500 MG/100ML IV SOLN
500.0000 mg | Freq: Once | INTRAVENOUS | Status: AC
  Administered 2020-05-06: 500 mg via INTRAVENOUS
  Filled 2020-05-05: qty 100

## 2020-05-05 MED ORDER — DOCUSATE SODIUM 50 MG/5ML PO LIQD
100.0000 mg | Freq: Two times a day (BID) | ORAL | Status: DC
  Administered 2020-05-05 – 2020-05-20 (×18): 100 mg
  Filled 2020-05-05 (×21): qty 10

## 2020-05-05 MED ORDER — ETOMIDATE 2 MG/ML IV SOLN
INTRAVENOUS | Status: AC
  Filled 2020-05-05: qty 10

## 2020-05-05 MED ORDER — SODIUM CHLORIDE 0.9 % IV SOLN
2.0000 g | Freq: Every day | INTRAVENOUS | Status: DC
  Administered 2020-05-06 (×2): 2 g via INTRAVENOUS
  Filled 2020-05-05 (×2): qty 2
  Filled 2020-05-05: qty 20

## 2020-05-05 MED ORDER — DEXAMETHASONE SODIUM PHOSPHATE 10 MG/ML IJ SOLN
10.0000 mg | Freq: Two times a day (BID) | INTRAMUSCULAR | Status: DC
  Administered 2020-05-06 – 2020-05-08 (×5): 10 mg via INTRAVENOUS
  Filled 2020-05-05 (×7): qty 1

## 2020-05-05 MED ORDER — DEXMEDETOMIDINE HCL IN NACL 400 MCG/100ML IV SOLN
0.0000 ug/kg/h | INTRAVENOUS | Status: AC
  Administered 2020-05-05 – 2020-05-08 (×22): 1 ug/kg/h via INTRAVENOUS
  Filled 2020-05-05 (×13): qty 100
  Filled 2020-05-05: qty 200
  Filled 2020-05-05 (×13): qty 100

## 2020-05-05 MED ORDER — ETOMIDATE 2 MG/ML IV SOLN: 20.0000 mg | Freq: Once | INTRAVENOUS | Status: DC

## 2020-05-05 MED ORDER — ROCURONIUM BROMIDE 50 MG/5ML IV SOLN
INTRAVENOUS | Status: AC
  Filled 2020-05-05: qty 1

## 2020-05-05 MED ORDER — POLYETHYLENE GLYCOL 3350 17 G PO PACK
17.0000 g | PACK | Freq: Every day | ORAL | Status: DC
  Administered 2020-05-06 – 2020-05-23 (×8): 17 g
  Filled 2020-05-05 (×9): qty 1

## 2020-05-05 MED ORDER — VECURONIUM BROMIDE 10 MG IV SOLR
INTRAVENOUS | Status: AC
  Filled 2020-05-05: qty 10

## 2020-05-05 MED ORDER — NOREPINEPHRINE 16 MG/250ML-% IV SOLN
0.0000 ug/min | INTRAVENOUS | Status: DC
  Administered 2020-05-05: 10 ug/min via INTRAVENOUS
  Administered 2020-05-09: 2 ug/min via INTRAVENOUS
  Filled 2020-05-05 (×3): qty 250

## 2020-05-05 MED ORDER — ZINC SULFATE 220 (50 ZN) MG PO CAPS
220.0000 mg | ORAL_CAPSULE | Freq: Every day | ORAL | Status: DC
  Administered 2020-05-05 – 2020-05-23 (×17): 220 mg
  Filled 2020-05-05 (×17): qty 1

## 2020-05-05 MED ORDER — FENTANYL BOLUS VIA INFUSION
50.0000 ug | INTRAVENOUS | Status: DC | PRN
  Administered 2020-05-16 – 2020-05-20 (×4): 50 ug via INTRAVENOUS
  Filled 2020-05-05: qty 50

## 2020-05-05 MED ORDER — MIDAZOLAM HCL 2 MG/2ML IJ SOLN
INTRAMUSCULAR | Status: AC
  Filled 2020-05-05: qty 2

## 2020-05-05 MED ORDER — FENTANYL CITRATE (PF) 100 MCG/2ML IJ SOLN
50.0000 ug | Freq: Once | INTRAMUSCULAR | Status: DC
  Filled 2020-05-05: qty 2

## 2020-05-05 MED ORDER — FENTANYL CITRATE (PF) 100 MCG/2ML IJ SOLN
INTRAMUSCULAR | Status: AC
Start: 2020-05-05 — End: 2020-05-06
  Filled 2020-05-05: qty 2

## 2020-05-05 MED ORDER — NOREPINEPHRINE 4 MG/250ML-% IV SOLN
INTRAVENOUS | Status: AC
  Filled 2020-05-05: qty 250

## 2020-05-05 MED ORDER — NOREPINEPHRINE 4 MG/250ML-% IV SOLN: 0.0000 ug/min | INTRAVENOUS | Status: DC

## 2020-05-05 MED ORDER — ETOMIDATE 2 MG/ML IV SOLN
40.0000 mg | Freq: Once | INTRAVENOUS | Status: AC
  Administered 2020-05-05: 40 mg via INTRAVENOUS

## 2020-05-05 MED ORDER — FENTANYL CITRATE (PF) 100 MCG/2ML IJ SOLN
100.0000 ug | Freq: Once | INTRAMUSCULAR | Status: AC
  Administered 2020-05-05: 100 ug via INTRAVENOUS

## 2020-05-05 MED ORDER — PROPOFOL 1000 MG/100ML IV EMUL
INTRAVENOUS | Status: AC
  Filled 2020-05-05: qty 200

## 2020-05-05 MED ORDER — PANTOPRAZOLE SODIUM 40 MG IV SOLR
40.0000 mg | INTRAVENOUS | Status: DC
  Administered 2020-05-05: 40 mg via INTRAVENOUS
  Filled 2020-05-05: qty 40

## 2020-05-05 MED ORDER — VECURONIUM BROMIDE 10 MG IV SOLR
10.0000 mg | INTRAVENOUS | Status: DC | PRN
  Administered 2020-05-05 – 2020-05-09 (×5): 10 mg via INTRAVENOUS
  Filled 2020-05-05 (×5): qty 10

## 2020-05-05 MED ORDER — MIDAZOLAM HCL 2 MG/2ML IJ SOLN
INTRAMUSCULAR | Status: AC
  Filled 2020-05-05: qty 4

## 2020-05-05 MED ORDER — ROCURONIUM BROMIDE 50 MG/5ML IV SOLN
80.0000 mg | Freq: Once | INTRAVENOUS | Status: AC
  Administered 2020-05-05: 80 mg via INTRAVENOUS

## 2020-05-05 MED ORDER — ROCURONIUM BROMIDE 50 MG/5ML IV SOLN: 50.0000 mg | Freq: Once | INTRAVENOUS | Status: DC

## 2020-05-05 MED ORDER — ASPIRIN 81 MG PO CHEW
81.0000 mg | CHEWABLE_TABLET | Freq: Every day | ORAL | Status: DC
  Administered 2020-05-05 – 2020-05-23 (×17): 81 mg
  Filled 2020-05-05 (×17): qty 1

## 2020-05-05 MED ORDER — VANCOMYCIN HCL 2000 MG/400ML IV SOLN
2000.0000 mg | Freq: Once | INTRAVENOUS | Status: AC
  Administered 2020-05-06: 2000 mg via INTRAVENOUS
  Filled 2020-05-05: qty 400

## 2020-05-05 MED ORDER — VASOPRESSIN 20 UNITS/100 ML INFUSION FOR SHOCK
0.0000 [IU]/min | INTRAVENOUS | Status: DC
  Administered 2020-05-14 – 2020-05-15 (×3): 0.03 [IU]/min via INTRAVENOUS
  Administered 2020-05-15: 0.02 [IU]/min via INTRAVENOUS
  Filled 2020-05-05 (×6): qty 100

## 2020-05-05 MED ORDER — PROPOFOL 1000 MG/100ML IV EMUL
5.0000 ug/kg/min | INTRAVENOUS | Status: DC
  Administered 2020-05-05 (×3): 80 ug/kg/min via INTRAVENOUS
  Administered 2020-05-06: 60 ug/kg/min via INTRAVENOUS
  Administered 2020-05-06: 70 ug/kg/min via INTRAVENOUS
  Administered 2020-05-06 (×2): 80 ug/kg/min via INTRAVENOUS
  Administered 2020-05-06: 50 ug/kg/min via INTRAVENOUS
  Administered 2020-05-06: 60 ug/kg/min via INTRAVENOUS
  Administered 2020-05-06: 50 ug/kg/min via INTRAVENOUS
  Administered 2020-05-06 (×5): 80 ug/kg/min via INTRAVENOUS
  Administered 2020-05-06: 50 ug/kg/min via INTRAVENOUS
  Administered 2020-05-06 (×2): 80 ug/kg/min via INTRAVENOUS
  Administered 2020-05-07 (×3): 40 ug/kg/min via INTRAVENOUS
  Administered 2020-05-07: 50 ug/kg/min via INTRAVENOUS
  Administered 2020-05-07 (×2): 35 ug/kg/min via INTRAVENOUS
  Administered 2020-05-08 (×4): 40 ug/kg/min via INTRAVENOUS
  Administered 2020-05-08 (×2): 60 ug/kg/min via INTRAVENOUS
  Administered 2020-05-08: 40 ug/kg/min via INTRAVENOUS
  Administered 2020-05-08: 60 ug/kg/min via INTRAVENOUS
  Administered 2020-05-09: 50 ug/kg/min via INTRAVENOUS
  Administered 2020-05-09: 60 ug/kg/min via INTRAVENOUS
  Administered 2020-05-09: 50 ug/kg/min via INTRAVENOUS
  Administered 2020-05-09 (×4): 60 ug/kg/min via INTRAVENOUS
  Administered 2020-05-09 (×2): 50 ug/kg/min via INTRAVENOUS
  Administered 2020-05-10 (×2): 20 ug/kg/min via INTRAVENOUS
  Administered 2020-05-10: 10 ug/kg/min via INTRAVENOUS
  Administered 2020-05-11: 8 ug/kg/min via INTRAVENOUS
  Administered 2020-05-11: 5 ug/kg/min via INTRAVENOUS
  Administered 2020-05-13 (×2): 20 ug/kg/min via INTRAVENOUS
  Administered 2020-05-13: 30 ug/kg/min via INTRAVENOUS
  Administered 2020-05-13: 20.033 ug/kg/min via INTRAVENOUS
  Administered 2020-05-14: 40 ug/kg/min via INTRAVENOUS
  Administered 2020-05-14: 25 ug/kg/min via INTRAVENOUS
  Administered 2020-05-14: 40 ug/kg/min via INTRAVENOUS
  Administered 2020-05-14: 30 ug/kg/min via INTRAVENOUS
  Administered 2020-05-14: 40 ug/kg/min via INTRAVENOUS
  Filled 2020-05-05 (×57): qty 100

## 2020-05-05 MED ORDER — FAMOTIDINE IN NACL 20-0.9 MG/50ML-% IV SOLN
20.0000 mg | Freq: Two times a day (BID) | INTRAVENOUS | Status: DC
  Administered 2020-05-06 – 2020-05-23 (×37): 20 mg via INTRAVENOUS
  Filled 2020-05-05 (×37): qty 50

## 2020-05-05 NOTE — Progress Notes (Signed)
Pt received to 2A, room 253, from ED.  Pt oriented to room and call button.  Pt denies pain or distress.  See admission data base, assessment and vs's.  ST 112 on the monitor.

## 2020-05-05 NOTE — Procedures (Signed)
Endotracheal Intubation: Patient required endotracheal intubation due to acute respiratory failure with hypoxia from acute respiratory distress syndrome due COVID-19 viral infection.  Consent: Emergent/Implied   Hand washing performed prior to starting the procedure.   Medications administered for sedation prior to procedure:  Midazolam 4 mg IV, rocuronium 40 mg IV, etomidate 40 IV fentanyl 100 mcg IV.    A time out procedure was called and correct patient, name, & ID confirmed. Needed supplies and equipment were assembled and checked to include ETT, 10 ml syringe, Glidescope, Mac and Miller blades, suction, oxygen and bag mask valve, end tidal CO2 monitor.   Patient was positioned to align the mouth and pharynx to facilitate visualization of the glottis.   Heart rate, SpO2 and blood pressure was continuously monitored during the procedure. Pre-oxygenation was attempted prior to intubation with ball-valve mask and Peep valve in place.  As the patient continued to be hypoxic visualization of the vocal cords with a glide a scope with a #4 blade was performed by Collin Stalker NP.  Patient's airway was very anterior.  Collin Brown attempted first pass however saturations at that point plummeted.  The attempt was aborted and the patient was bagged.  Again because of refractory hypoxemia second attempt was performed by myself.  Attempt was successful.  Patient remained hypoxemic and recruitment was done with bagging and increase Peep valve pressure to 15 cm.  Oxygenations remained in high 70s to low 80s.  #8 ET tube was placed at 25 cm marking at the teeth.  # attemps 2  Placement was confirmed by auscuitation of lungs with good breath sounds bilaterally and no epigastric sounds.  Condensation was noted on endotracheal tube.   Pulse ox 98%.  CO2 detector in place with appropriate color change.   Complications: None .   Operator: Collin Brown/Collin Brown  Chest radiograph ordered and pending.    Comments: OGT placed by bedside nurse  C. Derrill Kay, MD [facility]   *This note was dictated using voice recognition software/Dragon.  Despite best efforts to proofread, errors can occur which can change the meaning.  Any change was purely unintentional.

## 2020-05-05 NOTE — Consult Note (Signed)
Pharmacy Antibiotic Note  Collin Brown is a 34 y.o. male admitted on 05/03/2020 with COVID-19 pneumonia. Patient is now intubated, sedated, and on mechanical ventilation. Patient is requiring blood pressure support with Levophed + vasopressin. Pharmacy has been consulted for vancomycin dosing. Patient is also ordered ceftriaxone and remdesivir.  Plan:  Will order vancomycin 2500 mg (2000 mg + 500 mg) LD x 1 followed by maintenance regimen of vancomycin 1000 mg q8h per nomogram  Daily Scr per protocol. Levels at steady state as indicated.  Height: 5\' 10"  (177.8 cm) Weight: (!) 140.6 kg (310 lb) IBW/kg (Calculated) : 73  Temp (24hrs), Avg:98.3 F (36.8 C), Min:97.1 F (36.2 C), Max:99.6 F (37.6 C)  Recent Labs  Lab 05/03/20 1217 05/03/20 1618 05/03/20 1932 05/04/20 0530 05/05/20 0628  WBC 3.2*  --  3.6* 2.9* 6.2  CREATININE 0.80  --  0.84  --  0.80  LATICACIDVEN  --  1.0 0.8  --   --     Estimated Creatinine Clearance: 185.8 mL/min (by C-G formula based on SCr of 0.8 mg/dL).    No Known Allergies  Antimicrobials this admission: Remdesivir 9/23 >>  Ceftriaxone 9/25 >>  Vancomycin 9/25 >>   Dose adjustments this admission: n/a  Microbiology results: 9/23 SARS-CoV-2: positive 9/23 BCx: NGTD 9/25 Sputum: pending  9/25 MRSA PCR: pending  Thank you for allowing pharmacy to be a part of this patient's care.  10/25 05/05/2020 11:09 PM

## 2020-05-05 NOTE — Progress Notes (Signed)
2015  Pt was intubated with a #8 ETT without incidence.  Tube holder was Placed on Pt at 25cm at the lip.  Recruitment procedures were conducted to improve oxygenation of pt.

## 2020-05-05 NOTE — Significant Event (Signed)
Patient with persistent oxygen desaturations despite prone positioning.  He was also starting to become lethargic.  Discussed intubation and central line placement. Patient agreed to proceed.  Consent was emergent and implied.   Patient was intubated and placed on mechanical ventilation.  [name], Ms. Dorriann Lince was notified.  She herself is at home with COVID-19.    See further documentation by [name], NP.  Gailen Shelter, MD [facility]   *This note was dictated using voice recognition software/Dragon.  Despite best efforts to proofread, errors can occur which can change the meaning.  Any change was purely unintentional.

## 2020-05-05 NOTE — Progress Notes (Signed)
Patient's SATs remain in low 80's on heated HFNC and non-rebreather, RT has been to bedside to assess patient, BIPAP trialed with no change in SATS, MD notified and assessed patient, ICU charge RN made aware, Charge RN made aware on unit. Will continue to monitor.

## 2020-05-05 NOTE — Progress Notes (Signed)
   05/05/20 1300  Assess: MEWS Score  BP 137/79  Pulse Rate (!) 119  ECG Heart Rate (!) 119  Resp (!) 45  SpO2 (!) 73 %  Assess: MEWS Score  MEWS Temp 0  MEWS Systolic 0  MEWS Pulse 2  MEWS RR 3  MEWS LOC 0  MEWS Score 5  MEWS Score Color Red  Assess: if the MEWS score is Yellow or Red  Were vital signs taken at a resting state? Yes  Focused Assessment No change from prior assessment  Early Detection of Sepsis Score *See Row Information* Low  MEWS guidelines implemented *See Row Information* No, previously red, continue vital signs every 4 hours   MD/charge RN aware, orders placed for ICU transfer, awaiting bed.

## 2020-05-05 NOTE — Progress Notes (Signed)
A consult was placed to the IV Therapist for more access;  RN placed one iv in the pt's hand, but RN requests another site;  Veins are extremely deep when viewed on ultrasound;  Was able to place a 20ga 1.88" cath, but pt would benefit from a picc line if he needs more access.

## 2020-05-05 NOTE — Progress Notes (Signed)
RT to patient bedside due to CPAP alarming and patient desat. Patient pulled CPAP mask off to try to get some water. Patient desatted to 59% on room air. Placed patient back on CPAP with SAT improving to 87% and increasing. Patient educated on importance of keeping CPAP mask on and proning due to his increased oxygen demands. Call bell placed at patient side and instructed patient to call out before removing mask. Patient on CPAP and proned, will continue to monitor. RN made aware.

## 2020-05-05 NOTE — Procedures (Signed)
Central Venous Catheter Insertion Procedure Note  Collin Brown  [id number]  06-05-1986  Date:05/05/20  Time:9:23 PM   Provider Performing:[facility]   Procedure: Insertion of Non-tunneled Central Venous [phone]) with US guidance ([id number])   Indication(s) Medication administration and Difficult access  Consent Risks of the procedure as well as the alternatives and risks of each were explained to the patient and/or caregiver.  Consent for the procedure was obtained and is signed in the bedside chart  Anesthesia Topical only with 1% lidocaine   Timeout Verified patient identification, verified procedure, site/side was marked, verified correct patient position, special equipment/implants available, medications/allergies/relevant history reviewed, required imaging and test results available.  Sterile Technique Maximal sterile technique including full sterile barrier drape, hand hygiene, sterile gown, sterile gloves, mask, hair covering, sterile ultrasound probe cover (if used).  Procedure Description Area of catheter insertion was cleaned with chlorhexidine and draped in sterile fashion.  With real-time ultrasound guidance a central venous catheter was placed into the left internal jugular vein. Nonpulsatile blood flow and easy flushing noted in all ports.  The catheter was sutured in place and sterile dressing applied.  Complications/Tolerance None; patient tolerated the procedure well. Chest X-ray is ordered to verify placement for internal jugular or subclavian cannulation.   Chest x-ray is not ordered for femoral cannulation.  EBL Minimal  Specimen(s) None  Collin Brown, AGNP  Pulmonary/Critical Care Pager [phone] (please enter 7 digits) PCCM Consult Pager [phone] (please enter 7 digits)

## 2020-05-05 NOTE — Progress Notes (Signed)
[facility]  - [place] [facility]   PATIENT NAME: Collin Brown    MR#:  [id number]  DATE OF BIRTH:  1986/03/18  SUBJECTIVE:  patient's saturations have been dropping into the 70s and 80s. He is in prone position. Tried BiPAP earlier did not tolerate. Currently on heated high flow and not rebreather. Respiratory rate 30 to 35 complains of chest tightness. Mentation stable.  REVIEW OF SYSTEMS:   Review of Systems  Constitutional: Negative for chills, fever and weight loss.  HENT: Negative for ear discharge, ear pain and nosebleeds.   Eyes: Negative for blurred vision, pain and discharge.  Respiratory: Positive for shortness of breath. Negative for sputum production, wheezing and stridor.   Cardiovascular: Positive for chest pain. Negative for palpitations, orthopnea and PND.  Gastrointestinal: Negative for abdominal pain, diarrhea, nausea and vomiting.  Genitourinary: Negative for frequency and urgency.  Musculoskeletal: Negative for back pain and joint pain.  Neurological: Positive for weakness. Negative for sensory change, speech change and focal weakness.  Psychiatric/Behavioral: Negative for depression and hallucinations. The patient is not nervous/anxious.    Tolerating Diet:some Tolerating PT:   DRUG ALLERGIES:  No Known Allergies  VITALS:  Blood pressure (!) 142/80, pulse (!) 102, temperature (!) 97.4 F (36.3 C), temperature source Axillary, resp. rate (!) 37, height 5\' 10"  (1.778 m), weight (!) 140.6 kg, SpO2 (!) 85 %.  PHYSICAL EXAMINATION:   Physical Exam  GENERAL:  34 y.o.-year-old patient lying in the bed with  acute respiratory distress. Morbidly obese HEENT: Head atraumatic, normocephalic. Oropharynx and nasopharynx clear.  NECK:  Supple, no jugular venous distention. No thyroid enlargement, no tenderness.  LUNGS: shallow breath sounds bilaterally, no wheezing, rales, rhonchi. No use of accessory muscles of respiration.  CARDIOVASCULAR:  S1, S2 normal. No murmurs, rubs, or gallops. Tachycardia ABDOMEN: Soft, nontender, nondistended. Bowel sounds present. No organomegaly or mass.  EXTREMITIES: No cyanosis, clubbing or edema b/l.    NEUROLOGIC: unable to assess. Moves all extremities well PSYCHIATRIC:  patient is alert and oriented x 3.  SKIN: No obvious rash, lesion, or ulcer.   LABORATORY PANEL:  CBC Recent Labs  Lab 05/05/20 0628  WBC 6.2  HGB 13.5  HCT 42.3  PLT 168    Chemistries  Recent Labs  Lab 05/03/20 1217 05/03/20 1932 05/05/20 0628  NA 135  --  141  K 3.8  --  4.2  CL 98  --  103  CO2 28  --  30  GLUCOSE 125*  --  214*  BUN 14  --  20  CREATININE 0.80   < > 0.80  CALCIUM 8.3*  --  8.6*  MG  --   --  2.5*  AST 52*  --   --   ALT 44  --   --   ALKPHOS 51  --   --   BILITOT 0.6  --   --    < > = values in this interval not displayed.   Cardiac Enzymes No results for input(s): TROPONINI in the last 168 hours. RADIOLOGY:  DG Chest Portable 1 View  Result Date: 05/03/2020 CLINICAL DATA:  COVID pneumonia. EXAM: PORTABLE CHEST 1 VIEW COMPARISON:  None. FINDINGS: There are diffuse bilateral ground-glass airspace opacities. The heart size is enlarged. There is no pneumothorax or significant pleural effusion. There is no acute osseous abnormality. IMPRESSION: Diffuse bilateral ground-glass airspace opacities consistent with the patient's history of viral pneumonia. Electronically Signed   By: 05/05/2020.D.  On: 05/03/2020 16:36   ASSESSMENT AND PLAN:  Collin Brown a 34 y.o.malewith medical history significant ofOSA,in the emergency room with complaints of cough and shortness of breath and generalized weakness and fever the past few days. Patient comes to [place] after being tested positive for next care for Covid-19.   Acute hypoxic respiratory failure secondary to COVID-19 pneumonia: --In the emergency room vitals initially showed an oxygenation of 88% on room air and was started on  4L nasal cannula, however, O2 requirement rapidly increased to heated hf max setting.  Pt was therefore admitted to progressive unit. --continue Remdesivir --continue steroid as solumedrol 60 mg BID --trend CRP --Combivent QID --9/24-->on baricitinib  --Permissive hypoxemia with goal O2 >=85% -continue proning. Patient is the setting into the 70s with worsening hypoxemia. Maintaining his mentation. -Did not tolerate BiPAP. -Transfer to ICU-- discussed with Dr. Marcos Eke -discussed with mother on the phone. Explained worsening situation and patient may need to be placed on the ventilator. Mother was a bit hesitant about it however did explain the situation. High risk for cardio respiratory arrest given severity of COVID.  Obstructive sleep apnea: --will hold home CPAP due to COVID  Obesity BMI 44    DVT prophylaxis: Lovenox SQ Code Status: Full code  Family Communication:  mother on the phone Status is: inpatient Dispo:   The patient is from: home Anticipated d/c is to: home Anticipated d/c date is: >3 days Patient currently is not medically stable to d/c due to: COVID with high O2 requirement on heated hf patient is on the list for transfer to ICU once bed opens up       TOTAL TIME TAKING CARE OF THIS PATIENT: *35* minutes.  >50% time spent on counselling and coordination of care  Note: This dictation was prepared with Dragon dictation along with smaller phrase technology. Any transcriptional errors that result from this process are unintentional.  Enedina Finner M.D    [facility]   CC: Primary care physician; Patient, No Pcp PerPatient ID: Collin Brown, male   DOB: 1986-01-26, 34 y.o.   MRN: [id number]

## 2020-05-05 NOTE — Consult Note (Signed)
Reason for Consult: Acute respiratory failure with hypoxia due to COVID-19 pneumonia with ARDS. Referring Physician: Brunetta Genera, MD  Collin Brown is an 34 y.o. male.  HPI: Collin Brown is a 34 year old never smoker, with morbid obesity, who presented to [facility] on 03 May 2020 with a complaint of increasing shortness of breath.  He was diagnosed with COVID-19 and was admitted for further management.  Initially required only 4 L/min to maintain oxygen saturations in the low 90s.  There are 2 family members with COVID-19 in the home.  He was started on Covid protocol and admitted for observation with the hopes of a quick discharge to home.  Over the course of his admission however he has had increasing oxygen requirements.  He has noted to have more pronounced leukopenia.  He is currently on remdesivir and baricitinib.  He started doing some self proning  but even with this oxygen saturations would not increase higher than the mid 70s.  He was transferred to the intensive care unit to institute BiPAP therapy with proning.  At the time of my evaluation of the patient he is prone on BiPAP and somewhat sleepy.  He states he is getting tired.  He would like to give a trial to the BiPAP but agrees to intubation if necessary.  Due to his dyspnea, prone position on BiPAP further history is limited.  Past Medical History:  Diagnosis Date  . Hearing loss in right ear   . Sleep apnea     Past Surgical History:  Procedure Laterality Date  . TONSILLECTOMY      Family History  Problem Relation Age of Onset  . Diabetes Mellitus II Mother   . Hypertension Mother   . Diabetes Mellitus II Father   . High blood pressure Father     Social History:  reports that he has never smoked. He has never used smokeless tobacco. He reports that he does not drink alcohol and does not use drugs.  Allergies: No Known Allergies  Medications: Medications were reviewed.  Note that the patient is on methylprednisolone,  remdesivir and baricitinib  Results for orders placed or performed during the hospital encounter of 05/03/20 (from the past 48 hour(s))  CBC with Differential/Platelet     Status: Abnormal   Collection Time: 05/04/20  5:30 AM  Result Value Ref Range   WBC 2.9 (L) 4.0 - 10.5 K/uL   RBC 5.09 4.22 - 5.81 MIL/uL   Hemoglobin 13.9 13.0 - 17.0 g/dL   HCT 34.7 39 - 52 %   MCV 84.3 80.0 - 100.0 fL   MCH 27.3 26.0 - 34.0 pg   MCHC 32.4 30.0 - 36.0 g/dL   RDW 42.5 95.6 - 38.7 %   Platelets 142 (L) 150 - 400 K/uL   nRBC 0.0 0.0 - 0.2 %   Neutrophils Relative % 80 %   Neutro Abs 2.3 1.7 - 7.7 K/uL   Lymphocytes Relative 17 %   Lymphs Abs 0.5 (L) 0.7 - 4.0 K/uL   Monocytes Relative 3 %   Monocytes Absolute 0.1 0 - 1 K/uL   Eosinophils Relative 0 %   Eosinophils Absolute 0.0 0 - 0 K/uL   Basophils Relative 0 %   Basophils Absolute 0.0 0 - 0 K/uL   WBC Morphology MORPHOLOGY UNREMARKABLE    RBC Morphology MORPHOLOGY UNREMARKABLE    Smear Review Normal platelet morphology    Immature Granulocytes 0 %   Abs Immature Granulocytes 0.01 0.00 - 0.07 K/uL  Comment: Performed at [facility], [address] [place], [place] [address]  Fibrin derivatives D-Dimer [facility] only)     Status: Abnormal   Collection Time: 05/04/20  5:30 AM  Result Value Ref Range   Fibrin derivatives D-dimer ([facility]) 1,107.89 (H) 0.00 - 499.00 ng/mL (FEU)    Comment: (NOTE) <> Exclusion of Venous Thromboembolism (VTE) - OUTPATIENT ONLY   (Emergency Department or [place])    0-499 ng/ml (FEU): With a low to intermediate pretest probability                      for VTE this test result excludes the diagnosis                      of VTE.   >499 ng/ml (FEU) : VTE not excluded; additional work up for VTE is                      required.  <> Testing on Inpatients and Evaluation of Disseminated Intravascular   Coagulation (DIC) Reference Range:   0-499 ng/ml (FEU) Performed at [facility], [address] [place], [place] [address]   Glucose, capillary     Status: Abnormal   Collection Time: 05/04/20  8:05 PM  Result Value Ref Range   Glucose-Capillary 212 (H) 70 - 99 mg/dL    Comment: Glucose reference range applies only to samples taken after fasting for at least 8 hours.  C-reactive protein     Status: Abnormal   Collection Time: 05/05/20  6:28 AM  Result Value Ref Range   CRP 7.3 (H) <1.0 mg/dL    Comment: Performed at [facility], [address], [place], [place] [address]  Fibrin derivatives D-Dimer [facility] only)     Status: Abnormal   Collection Time: 05/05/20  6:28 AM  Result Value Ref Range   Fibrin derivatives D-dimer ([facility]) 901.82 (H) 0.00 - 499.00 ng/mL (FEU)    Comment: (NOTE) <> Exclusion of Venous Thromboembolism (VTE) - OUTPATIENT ONLY   (Emergency Department or [place])    0-499 ng/ml (FEU): With a low to intermediate pretest probability                      for VTE this test result excludes the diagnosis                      of VTE.   >499 ng/ml (FEU) : VTE not excluded; additional work up for VTE is                      required.  <> Testing on Inpatients and Evaluation of Disseminated Intravascular   Coagulation (DIC) Reference Range:   0-499 ng/ml (FEU) Performed at [facility], [address] [place], [place] [address]   Ferritin     Status: Abnormal   Collection Time: 05/05/20  6:28 AM  Result Value Ref Range   Ferritin 919 (H) 24 - 336 ng/mL    Comment: Performed at [facility], [address] [place], [place] [address]  Basic metabolic panel     Status: Abnormal   Collection Time: 05/05/20  6:28 AM  Result Value Ref Range   Sodium 141 135 - 145 mmol/L   Potassium 4.2 3.5 - 5.1 mmol/L   Chloride 103 98 - 111 mmol/L   CO2 30 22 - 32 mmol/L  Glucose, Bld 214 (H) 70 - 99 mg/dL    Comment: Glucose reference range applies only to samples taken after fasting for at least 8 hours.   BUN 20 6 - 20 mg/dL   Creatinine, Ser  8.46 0.61 - 1.24 mg/dL   Calcium 8.6 (L) 8.9 - 10.3 mg/dL   GFR calc non Af Amer >60 >60 mL/min   GFR calc Af Amer >60 >60 mL/min   Anion gap 8 5 - 15    Comment: Performed at [facility], [address] [place], [place] [address]  CBC     Status: None   Collection Time: 05/05/20  6:28 AM  Result Value Ref Range   WBC 6.2 4.0 - 10.5 K/uL   RBC 4.92 4.22 - 5.81 MIL/uL   Hemoglobin 13.5 13.0 - 17.0 g/dL   HCT 28.4 39 - 52 %   MCV 86.0 80.0 - 100.0 fL   MCH 27.4 26.0 - 34.0 pg   MCHC 31.9 30.0 - 36.0 g/dL   RDW 13.2 44.0 - 10.2 %   Platelets 168 150 - 400 K/uL   nRBC 0.0 0.0 - 0.2 %    Comment: Performed at [facility], [address] [place], [place] [address]  Magnesium     Status: Abnormal   Collection Time: 05/05/20  6:28 AM  Result Value Ref Range   Magnesium 2.5 (H) 1.7 - 2.4 mg/dL    Comment: Performed at [facility], [address] [place], [place] [address]  Glucose, capillary     Status: Abnormal   Collection Time: 05/05/20  3:25 PM  Result Value Ref Range   Glucose-Capillary 193 (H) 70 - 99 mg/dL    Comment: Glucose reference range applies only to samples taken after fasting for at least 8 hours.    DG Abd 1 View  Result Date: 05/05/2020 CLINICAL DATA:  OG tube placement EXAM: ABDOMEN - 1 VIEW COMPARISON:  None. FINDINGS: An enteric tube is present with tip in the left upper quadrant consistent with location in the upper stomach. Diffuse infiltrates in the lung bases. IMPRESSION: Enteric tube tip is in the left upper quadrant consistent with location in the upper stomach. Electronically Signed   By: Burman Nieves M.D.   On: 05/05/2020 21:39   DG Chest Port 1 View  Result Date: 05/05/2020 CLINICAL DATA:  Central line placement EXAM: PORTABLE CHEST 1 VIEW COMPARISON:  05/05/2020 FINDINGS: Endotracheal tube tip measures 4.7 cm above the carina. Enteric tube is present with tip off the field of view but below the left hemidiaphragm. A  left central venous catheter with tip in the left upper mediastinum consistent with location in the bronchi of cephalic vein. No change in location or appearance of the appliances. Borderline heart size. Diffuse nodular infiltration throughout both lungs. No pleural effusions. No pneumothorax. No change since prior study. IMPRESSION: Appliances remain unchanged in position. Persistent diffuse nodular infiltration throughout both lungs. No change since prior study. Electronically Signed   By: Burman Nieves M.D.   On: 05/05/2020 21:38    Review of Systems  Unable to fully obtain due to the patient's current position and need for BiPAP.  Blood pressure 139/79, pulse (!) 113, temperature 99.1 F (37.3 C), temperature source Axillary, resp. rate (!) 38, height 5\' 10"  (1.778 m), weight (!) 140.6 kg, SpO2 95 %. Physical Exam  Physical examination is limited due to need for PPE/CAPR, additional examination is limited due to prone position. GENERAL: Morbidly obese  man, prone in bed, with BiPAP in place, somewhat sleepy but arousable. HEAD: Normocephalic, atraumatic.  EYES: Pupils equal, round, reactive to light.  No scleral icterus.  MOUTH: BiPAP mask in place. NECK: Supple. Trachea midline.  Thick neck.  No adenopathy. PULMONARY: Good air entry bilaterally.  Coarse breath sounds, difficult to assess further due to CAPR. CARDIOVASCULAR: Monitor shows regular rate and rhythm.  ABDOMEN: Cannot assess due to prone positioning. MUSCULOSKELETAL: No joint deformity, no clubbing, no edema.  NEUROLOGIC: Sleepy but arousable.  Follows commands.  No overt focal deficit. SKIN: Intact,warm,dry. PSYCH: Flat affect  Assessment/Plan:  Acute hypoxic respiratory failure due to COVID-19 ARDS COVID-19 pneumonia Hx: Morbid obesity, obstructive sleep apnea Continue prone positioning as tolerates Continue BiPAP, titrate for O2 sats of 86% or better Incentive spirometry when supine and off BiPAP Continue  remdesivir Continue steroids switched to Decadron 10 mg twice daily Discontinue baricitinib due to leukopenia and lack of efficacy Underlying morbid obesity and OSA adds to risk for intubation Exceedingly high risk for intubation Follow inflammatory markers  Leukopenia May be due to viral infection Baricitinib may be aggravating issue We will discontinue baricitinib  Obstructive sleep apnea This issue adds complexity to his management  Prophylaxis GI: Famotidine DVT: Lovenox  Continue close monitoring in ICU/SDU.  Discussed with Dr. Enedina FinnerSona Patel  Thank you for allowing me to participate in this patient's care.   Gailen Shelter. Laura Coy Vandoren, MD [place] [facility] 05/05/2020, 10:40 PM   *This note was dictated using voice recognition software/Dragon.  Despite best efforts to proofread, errors can occur which can change the meaning.  Any change was purely unintentional.

## 2020-05-05 NOTE — Progress Notes (Signed)
   05/05/20 0800  Assess: MEWS Score  BP (!) 142/80  Pulse Rate (!) 102  ECG Heart Rate (!) 106  Resp (!) 37  SpO2 (!) 85 %  Assess: MEWS Score  MEWS Temp 0  MEWS Systolic 0  MEWS Pulse 1  MEWS RR 3  MEWS LOC 0  MEWS Score 4  MEWS Score Color Red  Assess: if the MEWS score is Yellow or Red  Were vital signs taken at a resting state? Yes  Focused Assessment No change from prior assessment  Early Detection of Sepsis Score *See Row Information* Low  MEWS guidelines implemented *See Row Information* No, previously red, continue vital signs every 4 hours  Treat  MEWS Interventions Other (Comment);Escalated (See documentation below);Consulted Respiratory Therapy (RT made aware, MD made aware, MD assessed pt)

## 2020-05-05 NOTE — Progress Notes (Signed)
Patient requesting to be taken off of CPAP, RT to bedside for assistance with HHFNC and NRBM placement. Patient transitioned to HHFNC + NRBM, SAT 79-84% and subsequently proned to help increase oxygenation status. Patient SAT 87% at this time. RN aware.

## 2020-05-05 NOTE — Progress Notes (Signed)
Pt transferred from 2A. Self proned and on bi-pap. Oxygen saturation improved from 70's to 80's. Pt remains proned. A&OX4. ST on the monitor. Brother Renae Fickle updated over the phone.

## 2020-05-05 NOTE — Plan of Care (Signed)

## 2020-05-05 NOTE — Progress Notes (Signed)
Report given to [facility], patient transferred to ICU.

## 2020-05-06 DIAGNOSIS — J9601 Acute respiratory failure with hypoxia: Secondary | ICD-10-CM | POA: Diagnosis not present

## 2020-05-06 DIAGNOSIS — U071 COVID-19: Secondary | ICD-10-CM | POA: Diagnosis not present

## 2020-05-06 LAB — GLUCOSE, CAPILLARY
Glucose-Capillary: 323 mg/dL — ABNORMAL HIGH (ref 70–99)
Glucose-Capillary: 354 mg/dL — ABNORMAL HIGH (ref 70–99)
Glucose-Capillary: 300 mg/dL — ABNORMAL HIGH (ref 70–99)
Glucose-Capillary: 298 mg/dL — ABNORMAL HIGH (ref 70–99)
Glucose-Capillary: 218 mg/dL — ABNORMAL HIGH (ref 70–99)
Glucose-Capillary: 212 mg/dL — ABNORMAL HIGH (ref 70–99)

## 2020-05-06 LAB — CBC
HCT: 44 % (ref 39.0–52.0)
Hemoglobin: 14.2 g/dL (ref 13.0–17.0)
MCH: 28 pg (ref 26.0–34.0)
MCHC: 32.3 g/dL (ref 30.0–36.0)
MCV: 86.8 fL (ref 80.0–100.0)
Platelets: 239 10*3/uL (ref 150–400)
RBC: 5.07 MIL/uL (ref 4.22–5.81)
RDW: 15.4 % (ref 11.5–15.5)
WBC: 12 10*3/uL — ABNORMAL HIGH (ref 4.0–10.5)
nRBC: 0 % (ref 0.0–0.2)

## 2020-05-06 LAB — BASIC METABOLIC PANEL
Anion gap: 10 (ref 5–15)
BUN: 28 mg/dL — ABNORMAL HIGH (ref 6–20)
CO2: 29 mmol/L (ref 22–32)
Calcium: 8.1 mg/dL — ABNORMAL LOW (ref 8.9–10.3)
Chloride: 102 mmol/L (ref 98–111)
Creatinine, Ser: 1.19 mg/dL (ref 0.61–1.24)
GFR calc Af Amer: >60 mL/min (ref >60)
GFR calc non Af Amer: >60 mL/min (ref >60)
Glucose, Bld: 361 mg/dL — ABNORMAL HIGH (ref 70–99)
Potassium: 5.1 mmol/L (ref 3.5–5.1)
Sodium: 141 mmol/L (ref 135–145)

## 2020-05-06 LAB — PROCALCITONIN: Procalcitonin: 0.41 ng/mL

## 2020-05-06 LAB — MRSA PCR SCREENING: MRSA by PCR: NEGATIVE

## 2020-05-06 LAB — BLOOD GAS, ARTERIAL
Acid-Base Excess: 3 mmol/L — ABNORMAL HIGH (ref 0.0–2.0)
Bicarbonate: 31.5 mmol/L — ABNORMAL HIGH (ref 20.0–28.0)
FIO2: 1
O2 Saturation: 99.3 %
Patient temperature: 37
pCO2 arterial: 64 mmHg — ABNORMAL HIGH (ref 32.0–48.0)
pH, Arterial: 7.3 — ABNORMAL LOW (ref 7.350–7.450)
pO2, Arterial: 159 mmHg — ABNORMAL HIGH (ref 83.0–108.0)

## 2020-05-06 LAB — FERRITIN: Ferritin: 873 ng/mL — ABNORMAL HIGH (ref 24–336)

## 2020-05-06 LAB — MAGNESIUM: Magnesium: 2.8 mg/dL — ABNORMAL HIGH (ref 1.7–2.4)

## 2020-05-06 LAB — PHOSPHORUS: Phosphorus: 3.7 mg/dL (ref 2.5–4.6)

## 2020-05-06 LAB — FIBRIN DERIVATIVES D-DIMER (ARMC ONLY): Fibrin derivatives D-dimer (ARMC): 1510.98 ng/mL (FEU) — ABNORMAL HIGH (ref 0.00–499.00)

## 2020-05-06 MED ORDER — CHLORHEXIDINE GLUCONATE 0.12% ORAL RINSE (MEDLINE KIT)
15.0000 mL | Freq: Two times a day (BID) | OROMUCOSAL | Status: DC
  Administered 2020-05-06 – 2020-05-22 (×32): 15 mL via OROMUCOSAL

## 2020-05-06 MED ORDER — ORAL CARE MOUTH RINSE
15.0000 mL | OROMUCOSAL | Status: DC
  Administered 2020-05-06 – 2020-05-22 (×157): 15 mL via OROMUCOSAL

## 2020-05-06 MED ORDER — INSULIN ASPART 100 UNIT/ML ~~LOC~~ SOLN
0.0000 [IU] | SUBCUTANEOUS | Status: DC
  Administered 2020-05-06: 20 [IU] via SUBCUTANEOUS
  Administered 2020-05-06: 7 [IU] via SUBCUTANEOUS
  Administered 2020-05-06: 15 [IU] via SUBCUTANEOUS
  Administered 2020-05-06 (×2): 11 [IU] via SUBCUTANEOUS
  Administered 2020-05-06 – 2020-05-07 (×2): 7 [IU] via SUBCUTANEOUS
  Administered 2020-05-07: 4 [IU] via SUBCUTANEOUS
  Administered 2020-05-07 (×2): 7 [IU] via SUBCUTANEOUS
  Administered 2020-05-07: 11 [IU] via SUBCUTANEOUS
  Administered 2020-05-08: 20 [IU] via SUBCUTANEOUS
  Administered 2020-05-08: 18 [IU] via SUBCUTANEOUS
  Administered 2020-05-08: 15 [IU] via SUBCUTANEOUS
  Filled 2020-05-06 (×14): qty 1

## 2020-05-06 NOTE — Care Plan (Signed)
Rec'd report at change of shift and assumed care of patient. Patient immediately assessed due to 02 sat of 77% while prone on 100% bipap. Dr. Jayme Cloud and Annabelle Harman, NP aware of pt's condition. MD discussed intubation and central line placement with patient who was agreeable. Care team prepared for emergent intubation and line placement. Pt was intubated at 2003. Left IJ TLC placed at 2045. Placed on fentanyl, precedex, and propofol gtts for sedation.

## 2020-05-06 NOTE — Progress Notes (Signed)
Have been unable to send sputum for culture today,several attempts but no secretions can be suctioned into a sputum trap

## 2020-05-06 NOTE — Progress Notes (Addendum)
34 yo male with morbid obesity and OSA admitted with acute respiratory failure with hypoxia due to COVID-19 pneumonia with ARDS.  Past Medical History:  Diagnosis Date  . Hearing loss in right ear   . Sleep apnea     Past Surgical History:  Procedure Laterality Date  . TONSILLECTOMY      Family History  Problem Relation Age of Onset  . Diabetes Mellitus II Mother   . Hypertension Mother   . Diabetes Mellitus II Father   . High blood pressure Father     Social History:  reports that he has never smoked. He has never used smokeless tobacco. He reports that he does not drink alcohol and does not use drugs.  Allergies: No Known Allergies  Medications: Medications were reviewed.  Note that the patient is on methylprednisolone, remdesivir and baricitinib  Results for orders placed or performed during the hospital encounter of 05/03/20 (from the past 48 hour(s))  CBC with Differential/Platelet     Status: Abnormal   Collection Time: 05/04/20  5:30 AM  Result Value Ref Range   WBC 2.9 (L) 4.0 - 10.5 K/uL   RBC 5.09 4.22 - 5.81 MIL/uL   Hemoglobin 13.9 13.0 - 17.0 g/dL   HCT 42.9 39 - 52 %   MCV 84.3 80.0 - 100.0 fL   MCH 27.3 26.0 - 34.0 pg   MCHC 32.4 30.0 - 36.0 g/dL   RDW 15.3 11.5 - 15.5 %   Platelets 142 (L) 150 - 400 K/uL   nRBC 0.0 0.0 - 0.2 %   Neutrophils Relative % 80 %   Neutro Abs 2.3 1.7 - 7.7 K/uL   Lymphocytes Relative 17 %   Lymphs Abs 0.5 (L) 0.7 - 4.0 K/uL   Monocytes Relative 3 %   Monocytes Absolute 0.1 0 - 1 K/uL   Eosinophils Relative 0 %   Eosinophils Absolute 0.0 0 - 0 K/uL   Basophils Relative 0 %   Basophils Absolute 0.0 0 - 0 K/uL   WBC Morphology MORPHOLOGY UNREMARKABLE    RBC Morphology MORPHOLOGY UNREMARKABLE    Smear Review Normal platelet morphology    Immature Granulocytes 0 %   Abs Immature Granulocytes 0.01 0.00 - 0.07 K/uL    Comment: Performed at [facility], [address] [place], [place] [address]  Fibrin  derivatives D-Dimer [facility] only)     Status: Abnormal   Collection Time: 05/04/20  5:30 AM  Result Value Ref Range   Fibrin derivatives D-dimer ([facility]) 1,107.89 (H) 0.00 - 499.00 ng/mL (FEU)    Comment: (NOTE) <> Exclusion of Venous Thromboembolism (VTE) - OUTPATIENT ONLY   (Emergency Department or [place])    0-499 ng/ml (FEU): With a low to intermediate pretest probability                      for VTE this test result excludes the diagnosis                      of VTE.   >499 ng/ml (FEU) : VTE not excluded; additional work up for VTE is                      required.  <> Testing on Inpatients and Evaluation of Disseminated Intravascular   Coagulation (DIC) Reference Range:   0-499 ng/ml (FEU) Performed at [facility], [address] [place], [place] [address]   Glucose, capillary  Status: Abnormal   Collection Time: 05/04/20  8:05 PM  Result Value Ref Range   Glucose-Capillary 212 (H) 70 - 99 mg/dL    Comment: Glucose reference range applies only to samples taken after fasting for at least 8 hours.  C-reactive protein     Status: Abnormal   Collection Time: 05/05/20  6:28 AM  Result Value Ref Range   CRP 7.3 (H) <1.0 mg/dL    Comment: Performed at [facility] [address] [place], [place] [address]  Fibrin derivatives D-Dimer [facility] only)     Status: Abnormal   Collection Time: 05/05/20  6:28 AM  Result Value Ref Range   Fibrin derivatives D-dimer ([facility]) 901.82 (H) 0.00 - 499.00 ng/mL (FEU)    Comment: (NOTE) <> Exclusion of Venous Thromboembolism (VTE) - OUTPATIENT ONLY   (Emergency Department or [place])    0-499 ng/ml (FEU): With a low to intermediate pretest probability                      for VTE this test result excludes the diagnosis                      of VTE.   >499 ng/ml (FEU) : VTE not excluded; additional work up for VTE is                      required.  <> Testing on Inpatients and Evaluation of Disseminated Intravascular    Coagulation (DIC) Reference Range:   0-499 ng/ml (FEU) Performed at [facility], [address] [place], [place] [address]   Ferritin     Status: Abnormal   Collection Time: 05/05/20  6:28 AM  Result Value Ref Range   Ferritin 919 (H) 24 - 336 ng/mL    Comment: Performed at [facility], [address] [place], [place] [address]  Basic metabolic panel     Status: Abnormal   Collection Time: 05/05/20  6:28 AM  Result Value Ref Range   Sodium 141 135 - 145 mmol/L   Potassium 4.2 3.5 - 5.1 mmol/L   Chloride 103 98 - 111 mmol/L   CO2 30 22 - 32 mmol/L   Glucose, Bld 214 (H) 70 - 99 mg/dL    Comment: Glucose reference range applies only to samples taken after fasting for at least 8 hours.   BUN 20 6 - 20 mg/dL   Creatinine, Ser 0.80 0.61 - 1.24 mg/dL   Calcium 8.6 (L) 8.9 - 10.3 mg/dL   GFR calc non Af Amer >60 >60 mL/min   GFR calc Af Amer >60 >60 mL/min   Anion gap 8 5 - 15    Comment: Performed at [facility], [address] [place], [place] [address]  CBC     Status: None   Collection Time: 05/05/20  6:28 AM  Result Value Ref Range   WBC 6.2 4.0 - 10.5 K/uL   RBC 4.92 4.22 - 5.81 MIL/uL   Hemoglobin 13.5 13.0 - 17.0 g/dL   HCT 42.3 39 - 52 %   MCV 86.0 80.0 - 100.0 fL   MCH 27.4 26.0 - 34.0 pg   MCHC 31.9 30.0 - 36.0 g/dL   RDW 15.3 11.5 - 15.5 %   Platelets 168 150 - 400 K/uL   nRBC 0.0 0.0 - 0.2 %    Comment: Performed at [facility], [address], [place], [place] [address]  Magnesium     Status: Abnormal   Collection Time: 05/05/20  6:28 AM  Result Value Ref Range   Magnesium 2.5 (H) 1.7 - 2.4 mg/dL    Comment: Performed at [facility], [address] [place], [place] [address]  Glucose, capillary     Status: Abnormal   Collection Time: 05/05/20  3:25 PM  Result Value Ref Range   Glucose-Capillary 193 (H) 70 - 99 mg/dL    Comment: Glucose reference range applies only to samples taken after fasting for at  least 8 hours.  Triglycerides     Status: Abnormal   Collection Time: 05/05/20 10:22 PM  Result Value Ref Range   Triglycerides 218 (H) <150 mg/dL    Comment: Performed at [facility], [address] [place], [place] [address]  Blood gas, arterial     Status: Abnormal   Collection Time: 05/05/20 10:30 PM  Result Value Ref Range   FIO2 1.00    pH, Arterial 7.30 (L) 7.35 - 7.45   pCO2 arterial 64 (H) 32 - 48 mmHg   pO2, Arterial 159 (H) 83 - 108 mmHg   Bicarbonate 31.5 (H) 20.0 - 28.0 mmol/L   Acid-Base Excess 3.0 (H) 0.0 - 2.0 mmol/L   O2 Saturation 99.3 %   Patient temperature 37.0    Collection site RIGHT RADIAL    Sample type ARTHROGRAPHIS SPECIES     Comment: Performed at [facility], [address] [place], [place] [address]    DG Abd 1 View  Result Date: 05/05/2020 CLINICAL DATA:  OG tube placement EXAM: ABDOMEN - 1 VIEW COMPARISON:  None. FINDINGS: An enteric tube is present with tip in the left upper quadrant consistent with location in the upper stomach. Diffuse infiltrates in the lung bases. IMPRESSION: Enteric tube tip is in the left upper quadrant consistent with location in the upper stomach. Electronically Signed   By: Lucienne Capers M.D.   On: 05/05/2020 21:39   DG Chest Port 1 View  Result Date: 05/05/2020 CLINICAL DATA:  Central line placement EXAM: PORTABLE CHEST 1 VIEW COMPARISON:  05/05/2020 FINDINGS: Endotracheal tube tip measures 4.7 cm above the carina. Enteric tube is present with tip off the field of view but below the left hemidiaphragm. A left central venous catheter with tip in the left upper mediastinum consistent with location in the bronchi of cephalic vein. No change in location or appearance of the appliances. Borderline heart size. Diffuse nodular infiltration throughout both lungs. No pleural effusions. No pneumothorax. No change since prior study. IMPRESSION: Appliances remain unchanged in position. Persistent diffuse nodular  infiltration throughout both lungs. No change since prior study. Electronically Signed   By: Lucienne Capers M.D.   On: 05/05/2020 21:38    Review of Systems  Unable to fully obtain due to the patient's current position and need for BiPAP.  Blood pressure (!) 115/56, pulse 96, temperature 99.1 F (37.3 C), temperature source Axillary, resp. rate 12, height _0  (1.778 m), weight (!) 140.6 kg, SpO2 97 %. Physical Exam  Physical examination is limited due to need for PPE/CAPR, additional examination is limited due to prone position. GENERAL: Morbidly obese male, NAD mechanically ventilated  HEAD: Normocephalic, atraumatic.  EYES: Pupils equal, round, reactive to light.  No scleral icterus.  MOUTH: Moist mucous membranes  NECK: Supple. Trachea midline.  Thick neck.  No adenopathy. PULMONARY: Coarse breath sounds and diminished bilateral bases, difficult to assess further due to CAPR. CARDIOVASCULAR: Sinus rhythm, rrr,  no R/G  ABDOMEN: Hypoactive BS x4, obese, non distended  MUSCULOSKELETAL: No joint deformity, no clubbing, no edema.  NEUROLOGIC: Sedated, not following commands SKIN: Intact,warm,dry.  Assessment/Plan:  Acute hypoxic respiratory failure due to COVID-19 ARDS COVID-19 pneumonia Underlying morbid obesity Mechanical intubation  Full vent support for now-vent settings reviewed and established  Mechanical ventilation via ARDS protocol, target PRVC 6 cc/kg Goal plateau pressure less than 30, driving pressure less than 15 SBT once all parameters met  VAP bundle implemented  Maintain O2 sats 88% or higher Continue remdesivir Continue steroids switched to Decadron 10 mg twice daily Discontinue baricitinib due to leukopenia and lack of efficacy Follow inflammatory markers Trend WBC and monitor fever curves  Follow cultures  Continue vancomycin and ceftriaxone started-09/25   Hypotension likely secondary to sedating medications  Continuous telemetry monitoring  Prn  levophed and vasopressin gtts to maintain map >65  Leukopenia May be due to viral infection Baricitinib may be aggravating issue We will discontinue baricitinib  Obstructive sleep apnea This issue adds complexity to his management  Hyperglycemia  CBG's q4hrs  SSI  Hemoglobin A1c pending   Mechanical intubation pain/discomfort Maintain RASS goal -1 to -2 Fentanyl, precedex, and propofol gtts to maintain RASS goal  Prn vecuronium for vent synchrony  Bilateral wrist restraints due to risk of self extubation   Prophylaxis GI: Famotidine DVT: Lovenox  Marda Stalker, [place] Pager [phone] (please enter 7 digits) PCCM Consult Pager [phone] (please enter 7 digits)

## 2020-05-06 NOTE — Progress Notes (Signed)
Patient ID: Collin Brown, male   DOB: 03/24/1986, 34 y.o.   MRN: [id number]  Patient intubated and on the ventilator. Will transfer pt under PCCM service with Dr Marcos Eke.

## 2020-05-06 NOTE — Progress Notes (Signed)
Pt bilateral wrist restraints taken off at 1000.

## 2020-05-07 ENCOUNTER — Inpatient Hospital Stay: Payer: BC Managed Care – PPO

## 2020-05-07 LAB — MAGNESIUM: Magnesium: 3.5 mg/dL — ABNORMAL HIGH (ref 1.7–2.4)

## 2020-05-07 LAB — BASIC METABOLIC PANEL
Anion gap: 6 (ref 5–15)
BUN: 42 mg/dL — ABNORMAL HIGH (ref 6–20)
CO2: 33 mmol/L — ABNORMAL HIGH (ref 22–32)
Calcium: 7.7 mg/dL — ABNORMAL LOW (ref 8.9–10.3)
Chloride: 105 mmol/L (ref 98–111)
Creatinine, Ser: 1.83 mg/dL — ABNORMAL HIGH (ref 0.61–1.24)
GFR calc Af Amer: 55 mL/min — ABNORMAL LOW (ref >60)
GFR calc non Af Amer: 47 mL/min — ABNORMAL LOW (ref >60)
Glucose, Bld: 224 mg/dL — ABNORMAL HIGH (ref 70–99)
Potassium: 4.6 mmol/L (ref 3.5–5.1)
Sodium: 144 mmol/L (ref 135–145)

## 2020-05-07 LAB — FERRITIN: Ferritin: 1085 ng/mL — ABNORMAL HIGH (ref 24–336)

## 2020-05-07 LAB — CBC
HCT: 37.6 % — ABNORMAL LOW (ref 39.0–52.0)
Hemoglobin: 11.9 g/dL — ABNORMAL LOW (ref 13.0–17.0)
MCH: 27.9 pg (ref 26.0–34.0)
MCHC: 31.6 g/dL (ref 30.0–36.0)
MCV: 88.1 fL (ref 80.0–100.0)
Platelets: 198 10*3/uL (ref 150–400)
RBC: 4.27 MIL/uL (ref 4.22–5.81)
RDW: 15.4 % (ref 11.5–15.5)
WBC: 6.8 10*3/uL (ref 4.0–10.5)
nRBC: 0.6 % — ABNORMAL HIGH (ref 0.0–0.2)

## 2020-05-07 LAB — FIBRIN DERIVATIVES D-DIMER (ARMC ONLY): Fibrin derivatives D-dimer (ARMC): 2572.01 ng/mL (FEU) — ABNORMAL HIGH (ref 0.00–499.00)

## 2020-05-07 LAB — GLUCOSE, CAPILLARY
Glucose-Capillary: 185 mg/dL — ABNORMAL HIGH (ref 70–99)
Glucose-Capillary: 237 mg/dL — ABNORMAL HIGH (ref 70–99)
Glucose-Capillary: 223 mg/dL — ABNORMAL HIGH (ref 70–99)
Glucose-Capillary: 233 mg/dL — ABNORMAL HIGH (ref 70–99)
Glucose-Capillary: 252 mg/dL — ABNORMAL HIGH (ref 70–99)
Glucose-Capillary: 276 mg/dL — ABNORMAL HIGH (ref 70–99)

## 2020-05-07 LAB — C-REACTIVE PROTEIN: CRP: 1.1 mg/dL — ABNORMAL HIGH (ref <1.0)

## 2020-05-07 LAB — PHOSPHORUS: Phosphorus: 2.6 mg/dL (ref 2.5–4.6)

## 2020-05-07 IMAGING — DX DG CHEST 1V PORT
1 series · 1 of 1 positions shown · non-contrast
Comparison: Portable chest [DATE] and earlier.

CLINICAL DATA: 34-year-old male [RU].  Respiratory failure.

EXAM:
PORTABLE CHEST 1 VIEW

[chest ap]
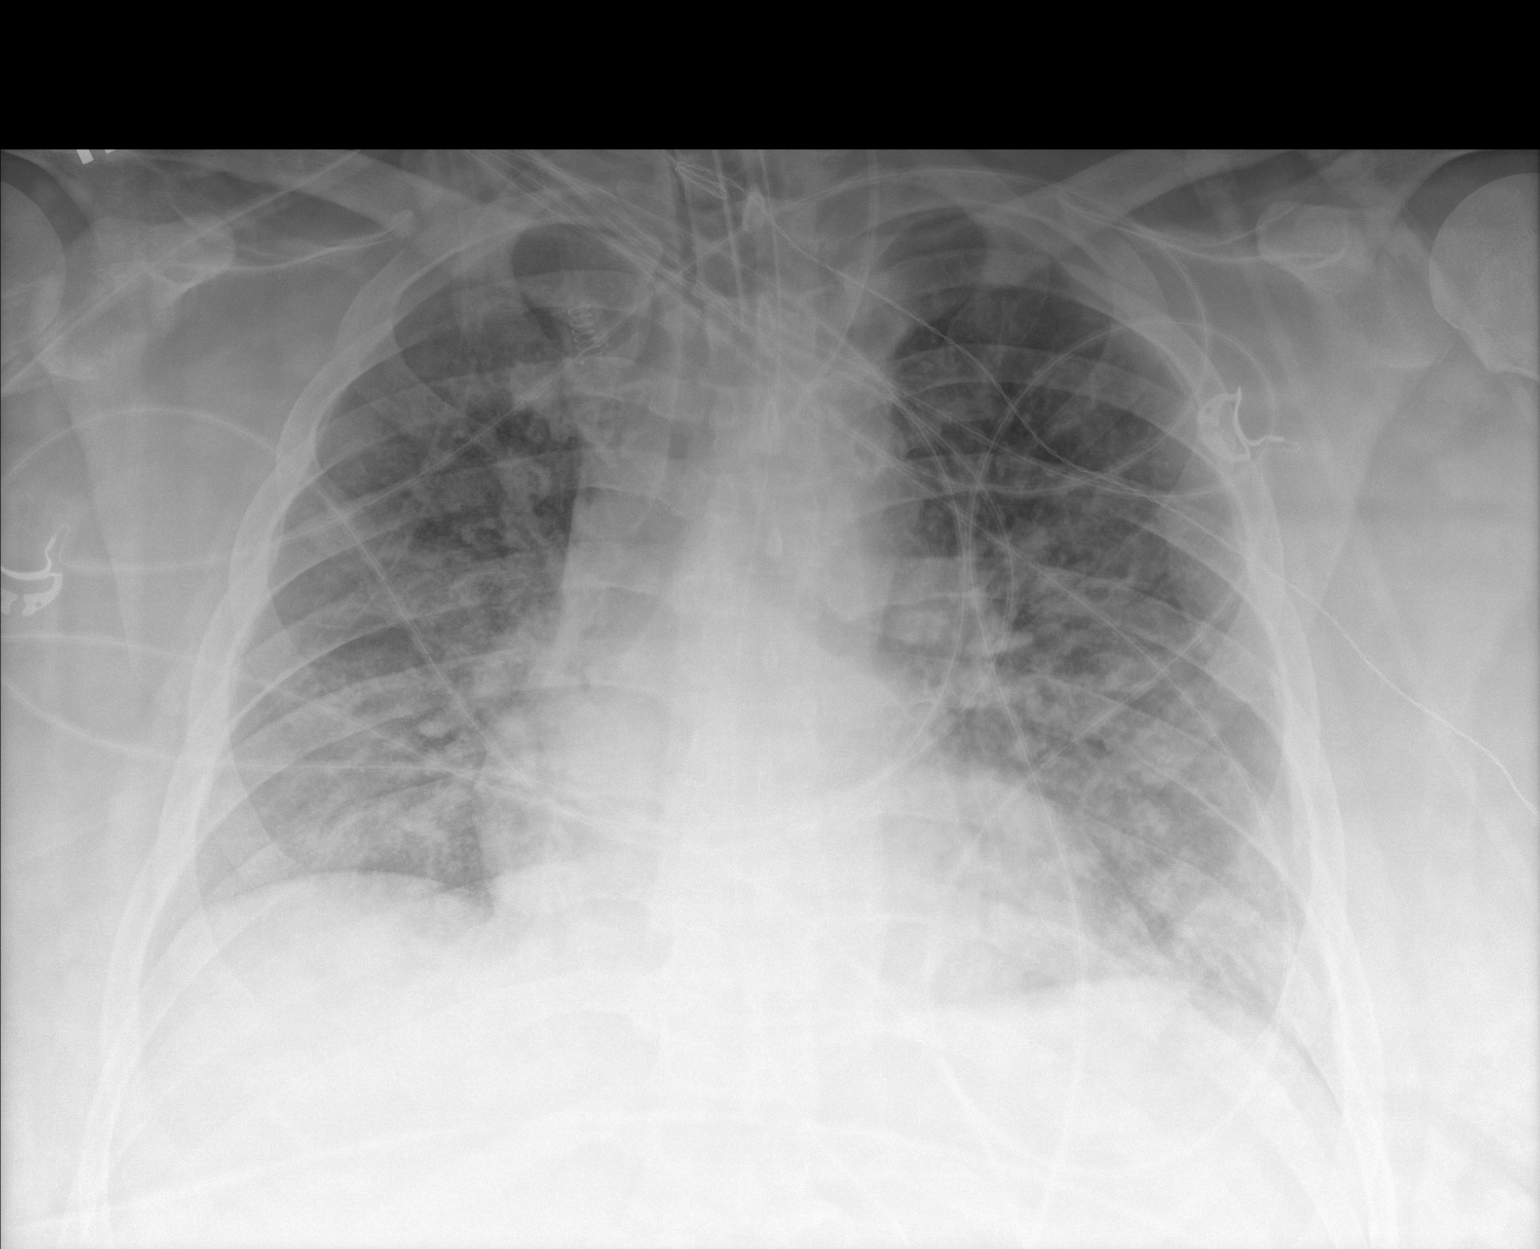

[1 of 1 positions shown; findings below may reference images not displayed]

FINDINGS: Portable AP semi upright view at [RU] hours. Endotracheal tube tip
at the level the clavicles. Enteric tube courses to the left upper
quadrant, tip not included. Stable left IJ central line.

Mildly improved lung volumes. Mediastinal contours remain within
normal limits. Coarse bilateral perihilar and basilar predominant
opacity persists, but ventilation has mildly improved from prior
exams. No pneumothorax. No pleural effusion. Paucity of bowel gas in
the upper abdomen. No acute osseous abnormality identified.
IMPRESSION: 1. Stable lines and tubes.
2. Mildly improved bilateral ventilation, persistent pneumonia. No
new cardiopulmonary abnormality.

## 2020-05-07 MED ORDER — SODIUM CHLORIDE 0.9 % IV SOLN
1.0000 g | Freq: Three times a day (TID) | INTRAVENOUS | Status: DC
  Administered 2020-05-07 – 2020-05-09 (×6): 1 g via INTRAVENOUS
  Filled 2020-05-07 (×8): qty 1

## 2020-05-07 MED ORDER — ACETAMINOPHEN 10 MG/ML IV SOLN
1000.0000 mg | Freq: Once | INTRAVENOUS | Status: AC
  Administered 2020-05-07: 1000 mg via INTRAVENOUS
  Filled 2020-05-07: qty 100

## 2020-05-07 MED ORDER — VITAL HIGH PROTEIN PO LIQD
1000.0000 mL | ORAL | Status: DC
  Administered 2020-05-07 – 2020-05-10 (×4): 1000 mL

## 2020-05-07 MED ORDER — INSULIN ASPART 100 UNIT/ML ~~LOC~~ SOLN
3.0000 [IU] | SUBCUTANEOUS | Status: DC
  Administered 2020-05-07 – 2020-05-08 (×5): 3 [IU] via SUBCUTANEOUS
  Filled 2020-05-07 (×4): qty 1

## 2020-05-07 MED ORDER — FENTANYL 2500MCG IN NS 250ML (10MCG/ML) PREMIX INFUSION
0.0000 ug/h | INTRAVENOUS | Status: DC
  Administered 2020-05-07 – 2020-05-08 (×4): 300 ug/h via INTRAVENOUS
  Administered 2020-05-09 (×2): 400 ug/h via INTRAVENOUS
  Administered 2020-05-09: 350 ug/h via INTRAVENOUS
  Administered 2020-05-09 – 2020-05-12 (×13): 400 ug/h via INTRAVENOUS
  Administered 2020-05-13 (×2): 200 ug/h via INTRAVENOUS
  Administered 2020-05-13: 400 ug/h via INTRAVENOUS
  Administered 2020-05-14 (×3): 350 ug/h via INTRAVENOUS
  Administered 2020-05-15: 400 ug/h via INTRAVENOUS
  Administered 2020-05-15: 300 ug/h via INTRAVENOUS
  Administered 2020-05-15 – 2020-05-17 (×7): 400 ug/h via INTRAVENOUS
  Filled 2020-05-07 (×35): qty 250

## 2020-05-07 MED ORDER — ADULT MULTIVITAMIN W/MINERALS CH
1.0000 | ORAL_TABLET | Freq: Every day | ORAL | Status: DC
  Administered 2020-05-08 – 2020-05-23 (×14): 1
  Filled 2020-05-07 (×14): qty 1

## 2020-05-07 MED ORDER — PROSOURCE TF PO LIQD
90.0000 mL | Freq: Four times a day (QID) | ORAL | Status: DC
  Administered 2020-05-07 – 2020-05-11 (×16): 90 mL
  Filled 2020-05-07 (×19): qty 90

## 2020-05-07 MED ORDER — SODIUM CHLORIDE 0.9 % IV SOLN
0.0000 ug/h | INTRAVENOUS | Status: DC
  Filled 2020-05-07 (×2): qty 50

## 2020-05-07 MED ORDER — INSULIN DETEMIR 100 UNIT/ML ~~LOC~~ SOLN
15.0000 [IU] | Freq: Two times a day (BID) | SUBCUTANEOUS | Status: DC
  Administered 2020-05-07 (×2): 15 [IU] via SUBCUTANEOUS
  Filled 2020-05-07 (×4): qty 0.15

## 2020-05-07 MED ORDER — FUROSEMIDE 10 MG/ML IJ SOLN
40.0000 mg | Freq: Two times a day (BID) | INTRAMUSCULAR | Status: DC
  Administered 2020-05-07 – 2020-05-09 (×5): 40 mg via INTRAVENOUS
  Filled 2020-05-07 (×5): qty 4

## 2020-05-07 NOTE — Progress Notes (Signed)
Initial Nutrition Assessment  DOCUMENTATION CODES:   Morbid obesity  INTERVENTION:  Initiate Vital High Protein at 40 mL/hr (960 mL goal daily volume) + PROSource TF 90 mL QID per tube. Provides 1280 kcal, 172 grams of protein, 806 mL H2O daily. With current propofol rate provides 2170 kcal daily.  Provide MVI daily per tube.  NUTRITION DIAGNOSIS:   Inadequate oral intake related to inability to eat as evidenced by NPO status.  GOAL:   Patient will meet greater than or equal to 90% of their needs  MONITOR:   Vent status, Labs, Weight trends, TF tolerance, I & O's  REASON FOR ASSESSMENT:   Ventilator, Consult Enteral/tube feeding initiation and management  ASSESSMENT:   34 year old male with PMHx of OSA admitted with COVID-19 PNA.   9/25 intubated  Patient is currently intubated on ventilator support MV: 14 L/min Temp (24hrs), Avg:103.8 F (39.9 C), Min:101.9 F (38.8 C), Max:104.5 F (40.3 C)  Propofol: 33.7 ml/hr (890 kcal daily)  Medications reviewed and include: vitamin C 500 mg daily per tube, Decadron 10 mg Q12hrs IV, Colace 100 mg BID per tube, Lasix 40 mg BID IV, Novolog 0-20 units Q4hrs, Miralax, zinc sulfate 220 mg daily per tube, Precedex gtt, famotidine, meropenem, propofol gtt.  Labs reviewed: CBG 223-237, CO2 33, BUN 42, Creatinine 1.83, Magnesium 3.5.  Enteral Access: OGT; terminates in upper stomach per abdominal x-ray 9/25  I/O: 1300 mL UOP yesterday (0.3 mL/kg/hr)  Weight trend: 178.2 kg on 9/27; pt was documented to be 140.6 kg on 9/23 and 9/24 but unsure if this was just copied forward from previous wt on 9/2 of exactly 140.6 kg; pt documented to have generalized edema so currently weight likely falsely elevated  Discussed with RN and on rounds. Plan is to start tube feeds today.  NUTRITION - FOCUSED PHYSICAL EXAM:  Deferred.  Diet Order:   Diet Order    None     EDUCATION NEEDS:   No education needs have been identified at this  time  Skin:  Skin Assessment: Reviewed RN Assessment  Last BM:  05/04/2020 per chart  Height:   Ht Readings from Last 1 Encounters:  05/04/20 5\' 10"  (1.778 m)   Weight:   Wt Readings from Last 1 Encounters:  05/07/20 (!) 178.2 kg   Ideal Body Weight:  75.5 kg  BMI:  Body mass index is 56.37 kg/m.  Estimated Nutritional Needs:   Kcal:  2050-2250  Protein:  170-180 grams  Fluid:  >/= 2 L/day  03-13-1979, MS, RD, LDN Pager number available on Amion

## 2020-05-07 NOTE — Progress Notes (Signed)
Inpatient Diabetes Program Recommendations  AACE/ADA: New Consensus Statement on Inpatient Glycemic Control (2015)  Target Ranges:  Prepandial:   less than 140 mg/dL      Peak postprandial:   less than 180 mg/dL (1-2 hours)      Critically ill patients:  140 - 180 mg/dL   Lab Results  Component Value Date   GLUCAP 223 (H) 05/07/2020   HGBA1C 7.2 (H) 05/03/2020    Review of Glycemic Control Results for ILIAS, STCHARLES (MRN [id number]) as of 05/07/2020 13:07  Ref. Range 05/05/2020 06:28 05/06/2020 04:45 05/07/2020 05:18  Glucose Latest Ref Range: 70 - 99 mg/dL 270 (H) 786 (H) 754 (H)   Diabetes history: None Outpatient Diabetes medications: None Current orders for Inpatient glycemic control:  Novolog resistant q 4 hours Vital 40 cc/ hr Decadron 10 mg bid Inpatient Diabetes Program Recommendations:    Please consider adding Levemir 15 units bid.  Also consider adding Novolog 3 units q 4 hours while on tube feeds.    Thanks,  Beryl Meager, RN, BC-ADM Inpatient Diabetes Coordinator Pager [phone] (8a-5p)

## 2020-05-07 NOTE — Progress Notes (Signed)
Patient's brother telephoned in to check on patient. He states that their mother is also sick, at home. Brother updated and given a direct number to ICU.

## 2020-05-07 NOTE — Progress Notes (Signed)
Patient continues to run elevated temperature this shift. PRN Tylenol given with little improvement noted. Cooling blanket placed under patient with significant improvement noted. Continue to monitor.

## 2020-05-07 NOTE — Progress Notes (Signed)
CRITICAL CARE PROGRESS NOTE    Name: Collin Brown MRN: [id number] DOB: 24-Nov-1985     LOS: 4   SUBJECTIVE FINDINGS & SIGNIFICANT EVENTS    Patient description:  34 yo with OSA, morbid obesity BMI>56 came in with flu like illness diagnosed with COVID19 pneumonia was initially ion Medical floor with hospitalist service initiated Remdesevir and barictitinib, he progressively became more dyspneic he was placed on BIPAP and did not tolerate then placed on HFNC did not tolerate patient himself consented to MV and was intubated in MICU. Mother was notified who is sick at home with [place].   Events:  9/27- patient on 65%FiO2 with proning protocol. CVP trending, goal negative fluid balance.    Lines/tubes  9/25 - Left IJ placed  : Airway 8 mm (Active)  Secured at (cm) 26 cm 05/07/20 0801  Measured From Lips 05/07/20 [place] 05/07/20 0801  Secured By [employer] 05/07/20 0801  Tube Holder Repositioned Yes 05/07/20 0801  Cuff Pressure (cm H2O) 26 cm H2O 05/07/20 0801  Site Condition Dry 05/07/20 0801     CVC Triple Lumen 05/05/20 Left Internal jugular (Active)  Indication for Insertion or Continuance of Line Vasoactive infusions;Poor Vasculature-patient has had multiple peripheral attempts or PIVs lasting less than 24 hours 05/06/20 2130  Site Assessment Clean;Dry;Intact 05/06/20 2130  Proximal Lumen Status Infusing 05/06/20 2130  Medial Lumen Status Infusing 05/06/20 2130  Distal Lumen Status Infusing 05/06/20 2130  Dressing Type Transparent;Occlusive 05/06/20 2130  Dressing Status Clean;Dry;Intact 05/06/20 2130  Antimicrobial disc in place? Yes 05/06/20 2130  Line Care Connections checked and tightened 05/06/20 2130  Dressing Intervention Dressing reinforced 05/06/20 0900   Dressing Change Due 05/12/20 05/06/20 2130     NG/OG Tube Orogastric Center mouth Xray 65 cm (Active)  Cm Marking at Nare/Corner of Mouth (if applicable) 65 cm 16/94/50 2130  Site Assessment Clean;Dry;Intact 05/06/20 2130  Ongoing Placement Verification No change in cm markings or external length of tube from initial placement;No change in respiratory status;No acute changes, not attributed to clinical condition;Xray 05/06/20 2130  Status Clamped 05/06/20 2130     Urethral Catheter Donnald Garre, RN 16 Fr. (Active)  Indication for Insertion or Continuance of Catheter Unstable critically ill patients first 24-48 hours (See Criteria) 05/06/20 2000  Site Assessment Clean;Intact;Prolapse;Swelling 05/06/20 2000  Catheter Maintenance Bag below level of bladder;Catheter secured;Drainage bag/tubing not touching floor;Insertion date on drainage bag;No dependent loops;Seal intact 05/06/20 2000  Collection Container Standard drainage bag 05/06/20 2000  Securement Method Securing device (Describe) 05/06/20 2000  Urinary Catheter Interventions (if applicable) Unclamped 38/88/28 2000  Output (mL) 100 mL 05/07/20 0445    Microbiology/Sepsis markers: Results for orders placed or performed during the hospital encounter of 05/03/20  Blood Culture (routine x 2)     Status: None (Preliminary result)   Collection Time: 05/03/20  4:33 PM   Specimen: BLOOD  Result Value Ref Range Status   Specimen Description BLOOD BLOOD LEFT HAND  Final   Special Requests   Final    BOTTLES DRAWN AEROBIC AND ANAEROBIC Blood Culture results may not be optimal due to an inadequate volume of blood received in culture bottles   Culture   Final    NO GROWTH 4 DAYS Performed at [facility], [address], [place], [place] [address]    Report Status PENDING  Incomplete  Blood Culture (routine x 2)     Status: None (Preliminary result)   Collection Time: 05/03/20  4:33 PM   Specimen: BLOOD  Result Value Ref Range Status    Specimen Description BLOOD BLOOD RIGHT HAND  Final   Special Requests   Final    BOTTLES DRAWN AEROBIC AND ANAEROBIC Blood Culture adequate volume   Culture   Final    NO GROWTH 4 DAYS Performed at [facility], [address], [place], [place] [address]    Report Status PENDING  Incomplete  Respiratory Panel by RT PCR (Flu A&B, Covid) -     Status: Abnormal   Collection Time: 05/03/20  7:32 PM  Result Value Ref Range Status   SARS Coronavirus 2 by RT PCR POSITIVE (A) NEGATIVE Final    Comment: RESULT CALLED TO, READ BACK BY AND VERIFIED WITH: Leonides Schanz RN 2114 05/03/20 HNM/AR    Influenza A by PCR NEGATIVE NEGATIVE Final   Influenza B by PCR NEGATIVE NEGATIVE Final    Comment: Performed at [facility], [address] [place], [place] [address]  MRSA PCR Screening     Status: None   Collection Time: 05/06/20 11:51 AM   Specimen: Nasal Mucosa; Nasopharyngeal  Result Value Ref Range Status   MRSA by PCR NEGATIVE NEGATIVE Final    Comment:        The GeneXpert MRSA Assay (FDA approved for NASAL specimens only), is one component of a comprehensive MRSA colonization surveillance program. It is not intended to diagnose MRSA infection nor to guide or monitor treatment for MRSA infections. Performed at [facility], [address] [place], [place] [address]     Anti-infectives:  Anti-infectives (From admission, onward)   Start     Dose/Rate Route Frequency Ordered Stop   05/06/20 1200  vancomycin (VANCOCIN) IVPB 1000 mg/200 mL premix        1,000 mg 200 mL/hr over 60 Minutes Intravenous Every 8 hours 05/05/20 2330     05/06/20 0200  vancomycin (VANCOREADY) IVPB 500 mg/100 mL       "Followed by" Linked Group Details   500 mg 100 mL/hr over 60 Minutes Intravenous  Once 05/05/20 2330 05/06/20 0454   05/06/20 0000  vancomycin (VANCOREADY) IVPB 2000 mg/400 mL       "Followed by" Linked Group Details   2,000 mg 200 mL/hr over 120 Minutes  Intravenous  Once 05/05/20 2330 05/06/20 0304   05/05/20 2315  cefTRIAXone (ROCEPHIN) 2 g in sodium chloride 0.9 % 100 mL IVPB        2 g 200 mL/hr over 30 Minutes Intravenous Daily at 10 pm 05/05/20 2307     05/04/20 1000  remdesivir 100 mg in sodium chloride 0.9 % 100 mL IVPB       "Followed by" Linked Group Details   100 mg 200 mL/hr over 30 Minutes Intravenous Daily 05/03/20 1718 05/08/20 0959   05/03/20 1800  remdesivir 200 mg in sodium chloride 0.9% 250 mL IVPB       "Followed by" Linked Group Details   200 mg 580 mL/hr over 30 Minutes Intravenous Once 05/03/20 1718 05/03/20 1941       Consults: Treatment Team:  Pccm, Ander Gaster, MD     PAST MEDICAL HISTORY   Past Medical History:  Diagnosis Date  . Hearing loss in right ear   . Sleep apnea      SURGICAL HISTORY   Past Surgical History:  Procedure Laterality Date  . TONSILLECTOMY       FAMILY HISTORY   Family History  Problem Relation Age of  Onset  . Diabetes Mellitus II Mother   . Hypertension Mother   . Diabetes Mellitus II Father   . High blood pressure Father      SOCIAL HISTORY   Social History   Tobacco Use  . Smoking status: Never Smoker  . Smokeless tobacco: Never Used  Vaping Use  . Vaping Use: Never used  Substance Use Topics  . Alcohol use: No  . Drug use: No     MEDICATIONS   Current Medication:  Current Facility-Administered Medications:  .  acetaminophen (TYLENOL) tablet 1,000 mg, 1,000 mg, Oral, Q6H PRN, Sharion Settler, NP, 1,000 mg at 05/06/20 2148 .  ascorbic acid (VITAMIN C) tablet 500 mg, 500 mg, Per Tube, Daily, Awilda Bill, NP, 500 mg at 05/06/20 0913 .  aspirin chewable tablet 81 mg, 81 mg, Per Tube, Daily, Awilda Bill, NP, 81 mg at 05/06/20 0913 .  cefTRIAXone (ROCEPHIN) 2 g in sodium chloride 0.9 % 100 mL IVPB, 2 g, Intravenous, Q2200, Awilda Bill, NP, Stopped at 05/06/20 2218 .  chlorhexidine gluconate (MEDLINE KIT) (PERIDEX) 0.12 % solution 15  mL, 15 mL, Mouth Rinse, BID, Tyler Pita, MD, 15 mL at 05/06/20 2128 .  Chlorhexidine Gluconate Cloth 2 % PADS 6 each, 6 each, Topical, Daily, Tyler Pita, MD, 6 each at 05/06/20 0914 .  dexamethasone (DECADRON) injection 10 mg, 10 mg, Intravenous, Q12H, Tyler Pita, MD, 10 mg at 05/06/20 2148 .  dexmedetomidine (PRECEDEX) 400 MCG/100ML (4 mcg/mL) infusion, 0-1.2 mcg/kg/hr, Intravenous, Continuous, Blakeney, Dana G, NP, Last Rate: 35.2 mL/hr at 05/07/20 0700, 1 mcg/kg/hr at 05/07/20 0700 .  docusate (COLACE) 50 MG/5ML liquid 100 mg, 100 mg, Per Tube, BID, Awilda Bill, NP, 100 mg at 05/06/20 2149 .  enoxaparin (LOVENOX) injection 40 mg, 40 mg, Subcutaneous, BID, Para Skeans, MD, 40 mg at 05/06/20 2148 .  famotidine (PEPCID) IVPB 20 mg premix, 20 mg, Intravenous, Q12H, Tyler Pita, MD, Stopped at 05/06/20 2254 .  fentaNYL (SUBLIMAZE) bolus via infusion 50 mcg, 50 mcg, Intravenous, Q15 min PRN, Awilda Bill, NP .  fentaNYL (SUBLIMAZE) injection 50 mcg, 50 mcg, Intravenous, Once, Blakeney, Dreama Saa, NP .  fentaNYL citrate (PF) (SUBLIMAZE) 2,500 mcg in sodium chloride 0.9 % 250 mL (10 mcg/mL) infusion, 0-400 mcg/hr, Intravenous, Continuous, Vernard Gambles L, MD .  insulin aspart (novoLOG) injection 0-20 Units, 0-20 Units, Subcutaneous, Q4H, Awilda Bill, NP, 7 Units at 05/07/20 0805 .  MEDLINE mouth rinse, 15 mL, Mouth Rinse, 10 times per day, Tyler Pita, MD, 15 mL at 05/07/20 0523 .  norepinephrine (LEVOPHED) 16 mg in 227m premix infusion, 0-40 mcg/min, Intravenous, Titrated, BAwilda Bill NP, Stopped at 05/06/20 [id number].  polyethylene glycol (MIRALAX / GLYCOLAX) packet 17 g, 17 g, Per Tube, Daily, BAwilda Bill NP, 17 g at 05/06/20 0913 .  propofol (DIPRIVAN) 1000 MG/100ML infusion, 5-80 mcg/kg/min, Intravenous, Titrated, BAwilda Bill NP, Last Rate: 33.7 mL/hr at 05/07/20 0807, 40 mcg/kg/min at 05/07/20 0807 .  [COMPLETED] remdesivir 200 mg  in sodium chloride 0.9% 250 mL IVPB, 200 mg, Intravenous, Once, Stopped at 05/03/20 1941 **FOLLOWED BY** remdesivir 100 mg in sodium chloride 0.9 % 100 mL IVPB, 100 mg, Intravenous, Daily, Hallaji, Sheema M, RPH, Last Rate: 200 mL/hr at 05/06/20 0917, 100 mg at 05/06/20 0917 .  vancomycin (VANCOCIN) IVPB 1000 mg/200 mL premix, 1,000 mg, Intravenous, Q8H, CBenita Gutter RPH, Stopped at 05/07/20 0555 .  vasopressin (PITRESSIN) 20 Units  in sodium chloride 0.9 % 100 mL infusion-*FOR SHOCK*, 0-0.04 Units/min, Intravenous, Continuous, Blakeney, Dana G, NP .  vecuronium (NORCURON) injection 10 mg, 10 mg, Intravenous, Q2H PRN, Awilda Bill, NP, 10 mg at 05/05/20 2059 .  zinc sulfate capsule 220 mg, 220 mg, Per Tube, Daily, Awilda Bill, NP, 220 mg at 05/06/20 3267    ALLERGIES   Patient has no known allergies.    REVIEW OF SYSTEMS    10 point ROS unable to perform due to sedation on MV  PHYSICAL EXAMINATION   Vital Signs: Temp:  [101.9 F (38.8 C)-104.5 F (40.3 C)] 102.9 F (39.4 C) (09/27 0800) Pulse Rate:  [73-149] 92 (09/27 0800) Resp:  [12-40] 35 (09/27 0800) BP: (95-133)/(43-77) 133/77 (09/27 0800) SpO2:  [92 %-100 %] 100 % (09/27 0801) FiO2 (%):  [65 %-80 %] 65 % (09/27 0801) Weight:  [178.2 kg] 178.2 kg (09/27 0345)  GENERAL:NAD HEAD: Normocephalic, atraumatic.  EYES: Pupils equal, round, reactive to light.  No scleral icterus.  MOUTH: Moist mucosal membrane. NECK: Supple. No thyromegaly. No nodules. No JVD.  PULMONARY: rhonchi bilaterally  CARDIOVASCULAR: S1 and S2. Regular rate and rhythm. No murmurs, rubs, or gallops.  GASTROINTESTINAL: Soft, nontender, non-distended. No masses. Positive bowel sounds. No hepatosplenomegaly.  MUSCULOSKELETAL: No swelling, clubbing, or edema.  NEUROLOGIC: Mild distress due to acute illness SKIN:intact,warm,dry   PERTINENT DATA     Infusions: . cefTRIAXone (ROCEPHIN)  IV Stopped (05/06/20 2218)  . dexmedetomidine  (PRECEDEX) IV infusion 1 mcg/kg/hr (05/07/20 0700)  . famotidine (PEPCID) IV Stopped (05/06/20 2254)  . fentaNYL infusion INTRAVENOUS    . norepinephrine (LEVOPHED) Adult infusion Stopped (05/06/20 1245)  . propofol (DIPRIVAN) infusion 40 mcg/kg/min (05/07/20 0807)  . remdesivir 100 mg in NS 100 mL 100 mg (05/06/20 0917)  . vancomycin Stopped (05/07/20 0555)  . vasopressin     Scheduled Medications: . vitamin C  500 mg Per Tube Daily  . aspirin  81 mg Per Tube Daily  . chlorhexidine gluconate (MEDLINE KIT)  15 mL Mouth Rinse BID  . Chlorhexidine Gluconate Cloth  6 each Topical Daily  . dexamethasone (DECADRON) injection  10 mg Intravenous Q12H  . docusate  100 mg Per Tube BID  . enoxaparin (LOVENOX) injection  40 mg Subcutaneous BID  . fentaNYL (SUBLIMAZE) injection  50 mcg Intravenous Once  . insulin aspart  0-20 Units Subcutaneous Q4H  . mouth rinse  15 mL Mouth Rinse 10 times per day  . polyethylene glycol  17 g Per Tube Daily  . zinc sulfate  220 mg Per Tube Daily   PRN Medications: acetaminophen, fentaNYL, vecuronium Hemodynamic parameters:   Intake/Output: 09/26 0701 - 09/27 0700 In: 4379.3 [I.V.:3379.4; IV Piggyback:999.9] Out: 8099 [IPJAS:5053]  Ventilator  Settings: Vent Mode: PRVC FiO2 (%):  [65 %-80 %] 65 % Set Rate:  [25 bmp-35 bmp] 35 bmp Vt Set:  [440 mL] 440 mL PEEP:  [15 cmH20-20 cmH20] 15 cmH20 Plateau Pressure:  [26 cmH20] 26 cmH20   Other Labs:     LAB RESULTS:  Basic Metabolic Panel: Recent Labs  Lab 05/03/20 1217 05/03/20 1217 05/03/20 1932 05/05/20 0628 05/05/20 0628 05/06/20 0445 05/07/20 0518  NA 135  --   --  141  --  141 144  K 3.8   < >  --  4.2   < > 5.1 4.6  CL 98  --   --  103  --  102 105  CO2 28  --   --  30  --  29 33*  GLUCOSE 125*  --   --  214*  --  361* 224*  BUN 14  --   --  20  --  28* 42*  CREATININE 0.80  --  0.84 0.80  --  1.19 1.83*  CALCIUM 8.3*  --   --  8.6*  --  8.1* 7.7*  MG  --   --   --  2.5*  --  2.8*  3.5*  PHOS  --   --   --   --   --  3.7 2.6   < > = values in this interval not displayed.   Liver Function Tests: Recent Labs  Lab 05/03/20 1217  AST 52*  ALT 44  ALKPHOS 51  BILITOT 0.6  PROT 6.6  ALBUMIN 3.7   No results for input(s): LIPASE, AMYLASE in the last 168 hours. No results for input(s): AMMONIA in the last 168 hours. CBC: Recent Labs  Lab 05/03/20 1932 05/04/20 0530 05/05/20 0628 05/06/20 0445 05/07/20 0518  WBC 3.6* 2.9* 6.2 12.0* 6.8  NEUTROABS  --  2.3  --   --   --   HGB 13.0 13.9 13.5 14.2 11.9*  HCT 39.3 42.9 42.3 44.0 37.6*  MCV 84.0 84.3 86.0 86.8 88.1  PLT 127* 142* 168 239 198   Cardiac Enzymes: No results for input(s): CKTOTAL, CKMB, CKMBINDEX, TROPONINI in the last 168 hours. BNP: Invalid input(s): POCBNP CBG: Recent Labs  Lab 05/06/20 1528 05/06/20 1942 05/06/20 2257 05/07/20 0333 05/07/20 0746  GLUCAP 298* 218* 212* 185* 237*       IMAGING RESULTS:  Imaging: DG Abd 1 View  Result Date: 05/05/2020 CLINICAL DATA:  OG tube placement EXAM: ABDOMEN - 1 VIEW COMPARISON:  None. FINDINGS: An enteric tube is present with tip in the left upper quadrant consistent with location in the upper stomach. Diffuse infiltrates in the lung bases. IMPRESSION: Enteric tube tip is in the left upper quadrant consistent with location in the upper stomach. Electronically Signed   By: Lucienne Capers M.D.   On: 05/05/2020 21:39   DG Chest Port 1 View  Result Date: 05/07/2020 CLINICAL DATA:  34 year old male COVID-19.  Respiratory failure. EXAM: PORTABLE CHEST 1 VIEW COMPARISON:  Portable chest 05/05/2020 and earlier. FINDINGS: Portable AP semi upright view at 0801 hours. Endotracheal tube tip at the level the clavicles. Enteric tube courses to the left upper quadrant, tip not included. Stable left IJ central line. Mildly improved lung volumes. Mediastinal contours remain within normal limits. Coarse bilateral perihilar and basilar predominant opacity  persists, but ventilation has mildly improved from prior exams. No pneumothorax. No pleural effusion. Paucity of bowel gas in the upper abdomen. No acute osseous abnormality identified. IMPRESSION: 1. Stable lines and tubes. 2. Mildly improved bilateral ventilation, persistent pneumonia. No new cardiopulmonary abnormality. Electronically Signed   By: Genevie Ann M.D.   On: 05/07/2020 08:13   DG Chest Port 1 View  Result Date: 05/05/2020 CLINICAL DATA:  Central line placement EXAM: PORTABLE CHEST 1 VIEW COMPARISON:  05/05/2020 FINDINGS: Endotracheal tube tip measures 4.7 cm above the carina. Enteric tube is present with tip off the field of view but below the left hemidiaphragm. A left central venous catheter with tip in the left upper mediastinum consistent with location in the bronchi of cephalic vein. No change in location or appearance of the appliances. Borderline heart size. Diffuse nodular infiltration throughout both lungs. No pleural effusions. No pneumothorax. No change since  prior study. IMPRESSION: Appliances remain unchanged in position. Persistent diffuse nodular infiltration throughout both lungs. No change since prior study. Electronically Signed   By: Lucienne Capers M.D.   On: 05/05/2020 21:38   _0 @ DG Chest Port 1 View  Result Date: 05/07/2020 CLINICAL DATA:  34 year old male COVID-19.  Respiratory failure. EXAM: PORTABLE CHEST 1 VIEW COMPARISON:  Portable chest 05/05/2020 and earlier. FINDINGS: Portable AP semi upright view at 0801 hours. Endotracheal tube tip at the level the clavicles. Enteric tube courses to the left upper quadrant, tip not included. Stable left IJ central line. Mildly improved lung volumes. Mediastinal contours remain within normal limits. Coarse bilateral perihilar and basilar predominant opacity persists, but ventilation has mildly improved from prior exams. No pneumothorax. No pleural effusion. Paucity of bowel gas in the upper abdomen. No acute osseous  abnormality identified. IMPRESSION: 1. Stable lines and tubes. 2. Mildly improved bilateral ventilation, persistent pneumonia. No new cardiopulmonary abnormality. Electronically Signed   By: Genevie Ann M.D.   On: 05/07/2020 08:13       ASSESSMENT AND PLAN    -Multidisciplinary rounds held today  Acute hypoxic respiratory failure due to COVID-19 ARDS COVID-19 pneumonia Underlying morbid obesity Mechanical intubation  Full vent support for now-vent settings reviewed and established  Mechanical ventilation via ARDS protocol, target PRVC 6 cc/kg Goal plateau pressure less than 30, driving pressure less than 15 SBT once all parameters met  VAP bundle implemented  Maintain O2 sats 88% or higher Continue remdesivir Continue steroids switched to Decadron 10 mg twice daily Discontinue baricitinib due to leukopenia and lack of efficacy Follow inflammatory markers Trend WBC and monitor fever curves  Follow cultures  Continue vancomycin and ceftriaxone started-09/25  -9/27- diurese and keep negative fluid balance.  Wean O2 currently at 65%.  Minimize fluid infusions. Patient febrile will start Tylenol     ID -continue IV abx as prescibed -follow up cultures  GI/Nutrition GI PROPHYLAXIS as indicated DIET-->TF's as tolerated Constipation protocol as indicated  ENDO - ICU hypoglycemic\Hyperglycemia protocol -check FSBS per protocol   ELECTROLYTES -follow labs as needed -replace as needed -pharmacy consultation   DVT/GI PRX ordered -SCDs  TRANSFUSIONS AS NEEDED MONITOR FSBS ASSESS the need for LABS as needed   Critical care provider statement:    Critical care time (minutes):  33   Critical care time was exclusive of:  Separately billable procedures and treating other patients   Critical care was necessary to treat or prevent imminent or life-threatening deterioration of the following conditions:  ARDS due to [place] pneumonia   Critical care was time spent personally by  me on the following activities:  Development of treatment plan with patient or surrogate, discussions with consultants, evaluation of patient's response to treatment, examination of patient, obtaining history from patient or surrogate, ordering and performing treatments and interventions, ordering and review of laboratory studies and re-evaluation of patient's condition.  I assumed direction of critical care for this patient from another provider in my specialty: no    This document was prepared using Dragon voice recognition software and may include unintentional dictation errors.    Ottie Glazier, M.D.  Division of [facility]

## 2020-05-07 NOTE — Progress Notes (Signed)
Pt temp 103.5, has remained high despite giving prn tylenol and using cooling blanket. Dr. Kathyrn Lass. Notified  during am rounds and again at this time. IV acetaminophen ordered. Will continue to monitor.

## 2020-05-08 LAB — CULTURE, BLOOD (ROUTINE X 2)
Culture: NO GROWTH
Culture: NO GROWTH
Special Requests: ADEQUATE

## 2020-05-08 LAB — POTASSIUM
Potassium: 4.2 mmol/L (ref 3.5–5.1)
Potassium: 4.2 mmol/L (ref 3.5–5.1)

## 2020-05-08 LAB — CBC WITH DIFFERENTIAL/PLATELET
Abs Immature Granulocytes: 0.11 10*3/uL — ABNORMAL HIGH (ref 0.00–0.07)
Basophils Absolute: 0 10*3/uL (ref 0.0–0.1)
Basophils Relative: 0 %
Eosinophils Absolute: 0 10*3/uL (ref 0.0–0.5)
Eosinophils Relative: 0 %
HCT: 37.6 % — ABNORMAL LOW (ref 39.0–52.0)
Hemoglobin: 11.4 g/dL — ABNORMAL LOW (ref 13.0–17.0)
Immature Granulocytes: 2 %
Lymphocytes Relative: 14 %
Lymphs Abs: 1 10*3/uL (ref 0.7–4.0)
MCH: 27.6 pg (ref 26.0–34.0)
MCHC: 30.3 g/dL (ref 30.0–36.0)
MCV: 91 fL (ref 80.0–100.0)
Monocytes Absolute: 0.7 10*3/uL (ref 0.1–1.0)
Monocytes Relative: 11 %
Neutro Abs: 5 10*3/uL (ref 1.7–7.7)
Neutrophils Relative %: 73 %
Platelets: 189 10*3/uL (ref 150–400)
RBC: 4.13 MIL/uL — ABNORMAL LOW (ref 4.22–5.81)
RDW: 15.6 % — ABNORMAL HIGH (ref 11.5–15.5)
WBC: 6.9 10*3/uL (ref 4.0–10.5)
nRBC: 0.6 % — ABNORMAL HIGH (ref 0.0–0.2)

## 2020-05-08 LAB — GLUCOSE, CAPILLARY
Glucose-Capillary: 170 mg/dL — ABNORMAL HIGH (ref 70–99)
Glucose-Capillary: 202 mg/dL — ABNORMAL HIGH (ref 70–99)
Glucose-Capillary: 204 mg/dL — ABNORMAL HIGH (ref 70–99)
Glucose-Capillary: 215 mg/dL — ABNORMAL HIGH (ref 70–99)
Glucose-Capillary: 256 mg/dL — ABNORMAL HIGH (ref 70–99)
Glucose-Capillary: 266 mg/dL — ABNORMAL HIGH (ref 70–99)
Glucose-Capillary: 287 mg/dL — ABNORMAL HIGH (ref 70–99)
Glucose-Capillary: 295 mg/dL — ABNORMAL HIGH (ref 70–99)
Glucose-Capillary: 309 mg/dL — ABNORMAL HIGH (ref 70–99)
Glucose-Capillary: 349 mg/dL — ABNORMAL HIGH (ref 70–99)
Glucose-Capillary: 363 mg/dL — ABNORMAL HIGH (ref 70–99)
Glucose-Capillary: 375 mg/dL — ABNORMAL HIGH (ref 70–99)
Glucose-Capillary: 379 mg/dL — ABNORMAL HIGH (ref 70–99)
Glucose-Capillary: 412 mg/dL — ABNORMAL HIGH (ref 70–99)
Glucose-Capillary: 417 mg/dL — ABNORMAL HIGH (ref 70–99)

## 2020-05-08 LAB — COMPREHENSIVE METABOLIC PANEL
ALT: 40 U/L (ref 0–44)
AST: 31 U/L (ref 15–41)
Albumin: 3 g/dL — ABNORMAL LOW (ref 3.5–5.0)
Alkaline Phosphatase: 45 U/L (ref 38–126)
Anion gap: 7 (ref 5–15)
BUN: 50 mg/dL — ABNORMAL HIGH (ref 6–20)
CO2: 31 mmol/L (ref 22–32)
Calcium: 7.7 mg/dL — ABNORMAL LOW (ref 8.9–10.3)
Chloride: 105 mmol/L (ref 98–111)
Creatinine, Ser: 1.3 mg/dL — ABNORMAL HIGH (ref 0.61–1.24)
GFR calc Af Amer: >60 mL/min (ref >60)
GFR calc non Af Amer: >60 mL/min (ref >60)
Glucose, Bld: 437 mg/dL — ABNORMAL HIGH (ref 70–99)
Potassium: 4.6 mmol/L (ref 3.5–5.1)
Sodium: 143 mmol/L (ref 135–145)
Total Bilirubin: 1 mg/dL (ref 0.3–1.2)
Total Protein: 5.7 g/dL — ABNORMAL LOW (ref 6.5–8.1)

## 2020-05-08 LAB — MAGNESIUM: Magnesium: 3.5 mg/dL — ABNORMAL HIGH (ref 1.7–2.4)

## 2020-05-08 LAB — PHOSPHORUS: Phosphorus: 4.7 mg/dL — ABNORMAL HIGH (ref 2.5–4.6)

## 2020-05-08 LAB — FERRITIN: Ferritin: 1010 ng/mL — ABNORMAL HIGH (ref 24–336)

## 2020-05-08 LAB — FIBRIN DERIVATIVES D-DIMER (ARMC ONLY): Fibrin derivatives D-dimer (ARMC): 1272.99 ng/mL (FEU) — ABNORMAL HIGH (ref 0.00–499.00)

## 2020-05-08 LAB — C-REACTIVE PROTEIN: CRP: 3.6 mg/dL — ABNORMAL HIGH (ref <1.0)

## 2020-05-08 LAB — TRIGLYCERIDES: Triglycerides: UNDETERMINED mg/dL (ref <150)

## 2020-05-08 MED ORDER — INSULIN REGULAR(HUMAN) IN NACL 100-0.9 UT/100ML-% IV SOLN
INTRAVENOUS | Status: DC
  Administered 2020-05-08: 12 [IU]/h via INTRAVENOUS
  Administered 2020-05-08: 30 [IU]/h via INTRAVENOUS
  Administered 2020-05-08: 23 [IU]/h via INTRAVENOUS
  Administered 2020-05-08: 30 [IU]/h via INTRAVENOUS
  Administered 2020-05-09: 18 [IU]/h via INTRAVENOUS
  Administered 2020-05-09: 15 [IU]/h via INTRAVENOUS
  Administered 2020-05-09: 17 [IU]/h via INTRAVENOUS
  Administered 2020-05-09: 15 [IU]/h via INTRAVENOUS
  Administered 2020-05-09: 23 [IU]/h via INTRAVENOUS
  Administered 2020-05-10: 11.5 [IU]/h via INTRAVENOUS
  Administered 2020-05-10: 14 [IU]/h via INTRAVENOUS
  Administered 2020-05-10: 6 [IU]/h via INTRAVENOUS
  Administered 2020-05-11: 10.5 [IU]/h via INTRAVENOUS
  Administered 2020-05-11: 6 [IU]/h via INTRAVENOUS
  Administered 2020-05-11: 13 [IU]/h via INTRAVENOUS
  Administered 2020-05-12: 11 [IU]/h via INTRAVENOUS
  Administered 2020-05-12: 12 [IU]/h via INTRAVENOUS
  Filled 2020-05-08 (×14): qty 100

## 2020-05-08 MED ORDER — DEXAMETHASONE SODIUM PHOSPHATE 10 MG/ML IJ SOLN
6.0000 mg | INTRAMUSCULAR | Status: DC
  Administered 2020-05-09 – 2020-05-22 (×14): 6 mg via INTRAVENOUS
  Filled 2020-05-08 (×14): qty 0.6

## 2020-05-08 MED ORDER — DEXTROSE 50 % IV SOLN: 0.0000 mL | INTRAVENOUS | Status: DC | PRN

## 2020-05-08 NOTE — Consult Note (Signed)
PHARMACY CONSULT NOTE  Pharmacy Consult for Electrolyte Monitoring and Replacement   Recent Labs: Potassium (mmol/L)  Date Value  05/08/2020 4.6   Magnesium (mg/dL)  Date Value  67/70/3403 3.5 (H)   Calcium (mg/dL)  Date Value  52/48/1859 7.7 (L)   Albumin (g/dL)  Date Value  09/31/1216 3.0 (L)   Phosphorus (mg/dL)  Date Value  24/46/9507 4.7 (H)   Sodium (mmol/L)  Date Value  05/08/2020 143  Corrected Calcium: 8.2  Assessment: Pharmacy has been consulted for electrolyte replacement in this 34 YOM requiring intubation and admission to the CCU for COVID PNA. Treatment including IV decadron, leading to the need for high amounts of insulin and eventually an insulin drip per the diabetes coordinator.  9/28 Electrolytes WNL with mag and phos elevated, possibly due to bump in Scr yesterday which has improved from 1.83>>1.30 and good UOP. Pt on day 6 of IV steroids with uncontrolled hyperglycemia requiring insulin drip which was started @1122 .  Pt is receiving tube feeds and no maintenance fluids at this time.    Goal of Therapy:  Electrolytes WNL  Plan:  -No replacement required at this time -Will recheck K at 1600 and evaluate levels and need to recheck as a result of the insulin drip.  -Recheck all other electrolytes with AM labs.   PharmD Candidate 2022 05/08/2020 1:32 PM

## 2020-05-08 NOTE — Consult Note (Signed)
PHARMACY CONSULT NOTE  Pharmacy Consult for Electrolyte Monitoring and Replacement   Recent Labs: Potassium (mmol/L)  Date Value  05/08/2020 4.2   Magnesium (mg/dL)  Date Value  14/97/0263 3.5 (H)   Calcium (mg/dL)  Date Value  78/58/8502 7.7 (L)   Albumin (g/dL)  Date Value  77/41/2878 3.0 (L)   Phosphorus (mg/dL)  Date Value  67/67/2094 4.7 (H)   Sodium (mmol/L)  Date Value  05/08/2020 143  Corrected Calcium: 8.2  Assessment: Pharmacy has been consulted for electrolyte replacement in this 34 YOM requiring intubation and admission to the CCU for COVID PNA. Treatment including IV decadron, leading to the need for high amounts of insulin and eventually an insulin drip per the diabetes coordinator.  9/28 Electrolytes WNL with mag and phos elevated, possibly due to bump in Scr yesterday which has improved from 1.83>>1.30 and good UOP. Pt on day 6 of IV steroids with uncontrolled hyperglycemia requiring insulin drip which was started @1122 .  Pt is receiving tube feeds and no maintenance fluids at this time.   Goal of Therapy:  Electrolytes WNL  Plan:  -2200 K = 4.2.  No replacement required at this time. Last documented insulin drip rate was 23 units/hr. Patient is also on IV Lasix BID -Will recheck K at 0500 tomorrow  -Recheck all other electrolytes with AM labs  A 05/08/2020 11:14 PM

## 2020-05-08 NOTE — Progress Notes (Signed)
Pt maintained on sedation this shift due to being on high FiO2 and multiple episodes of desating.

## 2020-05-08 NOTE — Consult Note (Addendum)
PHARMACY CONSULT NOTE  Pharmacy Consult for Electrolyte Monitoring and Replacement   Recent Labs: Potassium (mmol/L)  Date Value  05/08/2020 4.2   Magnesium (mg/dL)  Date Value  02/58/5277 3.5 (H)   Calcium (mg/dL)  Date Value  82/42/3536 7.7 (L)   Albumin (g/dL)  Date Value  14/43/1540 3.0 (L)   Phosphorus (mg/dL)  Date Value  08/67/6195 4.7 (H)   Sodium (mmol/L)  Date Value  05/08/2020 143  Corrected Calcium: 8.2  Assessment: Pharmacy has been consulted for electrolyte replacement in this 34 YOM requiring intubation and admission to the CCU for COVID PNA. Treatment including IV decadron, leading to the need for high amounts of insulin and eventually an insulin drip per the diabetes coordinator.  9/28 Electrolytes WNL with mag and phos elevated, possibly due to bump in Scr yesterday which has improved from 1.83>>1.30 and good UOP. Pt on day 6 of IV steroids with uncontrolled hyperglycemia requiring insulin drip which was started @1122 .  Pt is receiving tube feeds and no maintenance fluids at this time.   Goal of Therapy:  Electrolytes WNL  Plan:  -1600 K = 4.2. No replacement required at this time. Last documented insulin drip rate was 30 units/hr. Patient is also on IV Lasix BID, next dose due at 1800 -Will recheck K at 2200  -Recheck all other electrolytes with AM labs  05/08/2020 4:14 PM

## 2020-05-08 NOTE — Progress Notes (Signed)
Inpatient Diabetes Program Recommendations  AACE/ADA: New Consensus Statement on Inpatient Glycemic Control (2015)  Target Ranges:  Prepandial:   less than 140 mg/dL      Peak postprandial:   less than 180 mg/dL (1-2 hours)      Critically ill patients:  140 - 180 mg/dL   Lab Results  Component Value Date   GLUCAP 379 (H) 05/08/2020   HGBA1C 7.2 (H) 05/03/2020    Review of Glycemic Control Results for Collin Brown, Collin Brown (MRN [id number]) as of 05/08/2020 10:10  Ref. Range 05/07/2020 19:48 05/07/2020 21:59 05/08/2020 00:21 05/08/2020 04:05 05/08/2020 07:46  Glucose-Capillary Latest Ref Range: 70 - 99 mg/dL 353 (H) 299 (H) 242 (H) 349 (H) 379 (H)   Diabetes history: None- possible new diagnosis of DM Outpatient Diabetes medications:  None Current orders for Inpatient glycemic control:  Levemir 15 units bid, Novolog 3 units q 4 hours, Novolog resistant q 4 hours  Inpatient Diabetes Program Recommendations:   Blood sugars>300 mg/dL.   Recommend IV insulin/ICU glycemic control order set-Phase 2.   Thanks,  Beryl Meager, RN, BC-ADM Inpatient Diabetes Coordinator Pager [phone] (8a-5p)

## 2020-05-08 NOTE — Progress Notes (Signed)
CRITICAL CARE PROGRESS NOTE    Name: Collin Brown MRN: [id number] DOB: 1986/03/01     LOS: 5   SUBJECTIVE FINDINGS & SIGNIFICANT EVENTS    Patient description:  34 yo with OSA, morbid obesity BMI>56 came in with flu like illness diagnosed with COVID19 pneumonia was initially ion Medical floor with hospitalist service initiated Remdesevir and barictitinib, he progressively became more dyspneic he was placed on BIPAP and did not tolerate then placed on HFNC did not tolerate patient himself consented to MV and was intubated in MICU. Mother was notified who is sick at home with [place].   Events:  9/27- patient on 65%FiO2 with proning protocol. CVP trending, goal negative fluid balance.   05/08/20- patient with worsening ventilation increased FiO2 to 100% overnight and had episode of severe bradycardia.  Concern for mucus plugging will perform recruitment maneuvers and possible bronchoscopy.  We were able to perform tracheal aspirate and have weaned down fIO2 to 75%      Lines/tubes  9/25 - Left IJ placed  : Airway 8 mm (Active)  Secured at (cm) 26 cm 05/07/20 0801  Measured From Lips 05/07/20 [place] 05/07/20 0801  Secured By [employer] 05/07/20 0801  Tube Holder Repositioned Yes 05/07/20 0801  Cuff Pressure (cm H2O) 26 cm H2O 05/07/20 0801  Site Condition Dry 05/07/20 0801     CVC Triple Lumen 05/05/20 Left Internal jugular (Active)  Indication for Insertion or Continuance of Line Vasoactive infusions;Poor Vasculature-patient has had multiple peripheral attempts or PIVs lasting less than 24 hours 05/06/20 2130  Site Assessment Clean;Dry;Intact 05/06/20 2130  Proximal Lumen Status Infusing 05/06/20 2130  Medial Lumen Status Infusing 05/06/20 2130  Distal Lumen Status  Infusing 05/06/20 2130  Dressing Type Transparent;Occlusive 05/06/20 2130  Dressing Status Clean;Dry;Intact 05/06/20 2130  Antimicrobial disc in place? Yes 05/06/20 2130  Line Care Connections checked and tightened 05/06/20 2130  Dressing Intervention Dressing reinforced 05/06/20 0900  Dressing Change Due 05/12/20 05/06/20 2130     NG/OG Tube Orogastric Center mouth Xray 65 cm (Active)  Cm Marking at Nare/Corner of Mouth (if applicable) 65 cm 70/26/37 2130  Site Assessment Clean;Dry;Intact 05/06/20 2130  Ongoing Placement Verification No change in cm markings or external length of tube from initial placement;No change in respiratory status;No acute changes, not attributed to clinical condition;Xray 05/06/20 2130  Status Clamped 05/06/20 2130     Urethral Catheter Donnald Garre, RN 16 Fr. (Active)  Indication for Insertion or Continuance of Catheter Unstable critically ill patients first 24-48 hours (See Criteria) 05/06/20 2000  Site Assessment Clean;Intact;Prolapse;Swelling 05/06/20 2000  Catheter Maintenance Bag below level of bladder;Catheter secured;Drainage bag/tubing not touching floor;Insertion date on drainage bag;No dependent loops;Seal intact 05/06/20 2000  Collection Container Standard drainage bag 05/06/20 2000  Securement Method Securing device (Describe) 05/06/20 2000  Urinary Catheter Interventions (if applicable) Unclamped 85/88/50 2000  Output (mL) 100 mL 05/07/20 0445    Microbiology/Sepsis markers: Results for orders placed or performed during the hospital encounter of 05/03/20  Blood Culture (routine x 2)     Status: None   Collection Time: 05/03/20  4:33 PM   Specimen: BLOOD  Result Value Ref Range Status   Specimen Description BLOOD BLOOD LEFT HAND  Final   Special Requests   Final    BOTTLES DRAWN AEROBIC AND ANAEROBIC Blood Culture results may not be optimal due to an inadequate volume of blood received in culture bottles   Culture   Final  NO GROWTH 5 DAYS  Performed at [facility], [address] [place], [place] [address]    Report Status 05/08/2020 FINAL  Final  Blood Culture (routine x 2)     Status: None   Collection Time: 05/03/20  4:33 PM   Specimen: BLOOD  Result Value Ref Range Status   Specimen Description BLOOD BLOOD RIGHT HAND  Final   Special Requests   Final    BOTTLES DRAWN AEROBIC AND ANAEROBIC Blood Culture adequate volume   Culture   Final    NO GROWTH 5 DAYS Performed at [facility], [address] [place], [place] [address]    Report Status 05/08/2020 FINAL  Final  Respiratory Panel by RT PCR (Flu A&B, Covid) -     Status: Abnormal   Collection Time: 05/03/20  7:32 PM  Result Value Ref Range Status   SARS Coronavirus 2 by RT PCR POSITIVE (A) NEGATIVE Final    Comment: RESULT CALLED TO, READ BACK BY AND VERIFIED WITH: Leonides Schanz RN 2114 05/03/20 HNM/AR    Influenza A by PCR NEGATIVE NEGATIVE Final   Influenza B by PCR NEGATIVE NEGATIVE Final    Comment: Performed at [facility], [address] [place], [place] [address]  MRSA PCR Screening     Status: None   Collection Time: 05/06/20 11:51 AM   Specimen: Nasal Mucosa; Nasopharyngeal  Result Value Ref Range Status   MRSA by PCR NEGATIVE NEGATIVE Final    Comment:        The GeneXpert MRSA Assay (FDA approved for NASAL specimens only), is one component of a comprehensive MRSA colonization surveillance program. It is not intended to diagnose MRSA infection nor to guide or monitor treatment for MRSA infections. Performed at [facility], [address] [place], [place] [address]   Culture, respiratory (non-expectorated)     Status: None (Preliminary result)   Collection Time: 05/07/20 10:55 AM   Specimen: Tracheal Aspirate; Respiratory  Result Value Ref Range Status   Specimen Description   Final    TRACHEAL ASPIRATE Performed at [facility], [address] [place], [place] [address]    Special  Requests   Final    NONE Performed at [facility], [address] [place], [place] [address]    Gram Stain   Final    RARE WBC PRESENT,BOTH PMN AND MONONUCLEAR NO ORGANISMS SEEN Performed at [facility], [place] [address], [place], [place] [address]    Culture PENDING  Incomplete   Report Status PENDING  Incomplete    Anti-infectives:  Anti-infectives (From admission, onward)   Start     Dose/Rate Route Frequency Ordered Stop   05/07/20 1200  meropenem (MERREM) 1 g in sodium chloride 0.9 % 100 mL IVPB        1 g 200 mL/hr over 30 Minutes Intravenous Every 8 hours 05/07/20 1020     05/06/20 1200  vancomycin (VANCOCIN) IVPB 1000 mg/200 mL premix  Status:  Discontinued        1,000 mg 200 mL/hr over 60 Minutes Intravenous Every 8 hours 05/05/20 2330 05/07/20 1020   05/06/20 0200  vancomycin (VANCOREADY) IVPB 500 mg/100 mL       "Followed by" Linked Group Details   500 mg 100 mL/hr over 60 Minutes Intravenous  Once 05/05/20 2330 05/06/20 0454   05/06/20 0000  vancomycin (VANCOREADY) IVPB 2000 mg/400 mL       "Followed by" Linked Group Details  2,000 mg 200 mL/hr over 120 Minutes Intravenous  Once 05/05/20 2330 05/06/20 0304   05/05/20 2315  cefTRIAXone (ROCEPHIN) 2 g in sodium chloride 0.9 % 100 mL IVPB  Status:  Discontinued        2 g 200 mL/hr over 30 Minutes Intravenous Daily at 10 pm 05/05/20 2307 05/07/20 1020   05/04/20 1000  remdesivir 100 mg in sodium chloride 0.9 % 100 mL IVPB       "Followed by" Linked Group Details   100 mg 200 mL/hr over 30 Minutes Intravenous Daily 05/03/20 1718 05/07/20 0941   05/03/20 1800  remdesivir 200 mg in sodium chloride 0.9% 250 mL IVPB       "Followed by" Linked Group Details   200 mg 580 mL/hr over 30 Minutes Intravenous Once 05/03/20 1718 05/03/20 1941       Consults: Treatment Team:  [facility], [facility], MD     PAST MEDICAL HISTORY   Past Medical History:  Diagnosis Date  . Hearing loss in right ear   .  Sleep apnea      SURGICAL HISTORY   Past Surgical History:  Procedure Laterality Date  . TONSILLECTOMY       FAMILY HISTORY   Family History  Problem Relation Age of Onset  . Diabetes Mellitus II Mother   . Hypertension Mother   . Diabetes Mellitus II Father   . High blood pressure Father      SOCIAL HISTORY   Social History   Tobacco Use  . Smoking status: Never Smoker  . Smokeless tobacco: Never Used  Vaping Use  . Vaping Use: Never used  Substance Use Topics  . Alcohol use: No  . Drug use: No     MEDICATIONS   Current Medication:  Current Facility-Administered Medications:  .  acetaminophen (TYLENOL) tablet 1,000 mg, 1,000 mg, Oral, Q6H PRN, Morrison, Brenda, NP, 1,000 mg at 05/07/20 0907 .  ascorbic acid (VITAMIN C) tablet 500 mg, 500 mg, Per Tube, Daily, Blakeney, Dana G, NP, 500 mg at 05/08/20 0846 .  aspirin chewable tablet 81 mg, 81 mg, Per Tube, Daily, Blakeney, Dana G, NP, 81 mg at 05/08/20 0846 .  chlorhexidine gluconate (MEDLINE KIT) (PERIDEX) 0.12 % solution 15 mL, 15 mL, Mouth Rinse, BID, Gonzalez, Carmen L, MD, 15 mL at 05/08/20 0844 .  Chlorhexidine Gluconate Cloth 2 % PADS 6 each, 6 each, Topical, Daily, Gonzalez, Carmen L, MD, 6 each at 05/07/20 0908 .  dexamethasone (DECADRON) injection 10 mg, 10 mg, Intravenous, Q12H, Gonzalez, Carmen L, MD, 10 mg at 05/08/20 0900 .  dexmedetomidine (PRECEDEX) 400 MCG/100ML (4 mcg/mL) infusion, 0-1.2 mcg/kg/hr, Intravenous, Continuous, Blakeney, Dana G, NP, Last Rate: 35.2 mL/hr at 05/08/20 0905, 1 mcg/kg/hr at 05/08/20 0905 .  dextrose 50 % solution 0-50 mL, 0-50 mL, Intravenous, PRN, , , MD .  docusate (COLACE) 50 MG/5ML liquid 100 mg, 100 mg, Per Tube, BID, Blakeney, Dana G, NP, 100 mg at 05/08/20 0846 .  enoxaparin (LOVENOX) injection 40 mg, 40 mg, Subcutaneous, BID, Patel, Ekta V, MD, 40 mg at 05/08/20 0903 .  famotidine (PEPCID) IVPB 20 mg premix, 20 mg, Intravenous, Q12H, Gonzalez, Carmen L,  MD, Last Rate: 100 mL/hr at 05/08/20 0916, 20 mg at 05/08/20 0916 .  feeding supplement (PROSource TF) liquid 90 mL, 90 mL, Per Tube, QID, , , MD, 90 mL at 05/08/20 0846 .  feeding supplement (VITAL HIGH PROTEIN) liquid 1,000 mL, 1,000 mL, Per Tube, Q24H, , , MD, 1,000 mL at   05/07/20 1614 .  fentaNYL (SUBLIMAZE) bolus via infusion 50 mcg, 50 mcg, Intravenous, Q15 min PRN, Blakeney, Dreama Saa, NP .  fentaNYL (SUBLIMAZE) injection 50 mcg, 50 mcg, Intravenous, Once, Blakeney, Dana G, NP .  fentaNYL 2550mg in NS 2552m(1059mml) infusion-PREMIX, 0-400 mcg/hr, Intravenous, Continuous, ChaBenita GutterPH, Last Rate: 30 mL/hr at 05/08/20 0647, 300 mcg/hr at 05/08/20 0647 .  furosemide (LASIX) injection 40 mg, 40 mg, Intravenous, BID, AleLanney Ginsuad, MD, 40 mg at 05/08/20 0900 .  insulin regular, human (MYXREDLIN) 100 units/ 100 mL infusion, , Intravenous, Continuous, Londen Bok, MD .  MEDLINE mouth rinse, 15 mL, Mouth Rinse, 10 times per day, GonTyler PitaD, 15 mL at 05/08/20 0906 .  meropenem (MERREM) 1 g in sodium chloride 0.9 % 100 mL IVPB, 1 g, Intravenous, Q8H, AleOttie GlazierD, Stopped at 05/08/20 [phone] multivitamin with minerals tablet 1 tablet, 1 tablet, Per Tube, Daily, AleOttie GlazierD, 1 tablet at 05/08/20 0846 .  norepinephrine (LEVOPHED) 16 mg in 250m76memix infusion, 0-40 mcg/min, Intravenous, Titrated, BlakAwilda Bill, Stopped at 05/06/20 [phone] glycol (MIRALAX / GLYCOLAX) packet 17 g, 17 g, Per Tube, Daily, BlakAwilda Bill, 17 g at 05/08/20 0847 .  propofol (DIPRIVAN) 1000 MG/100ML infusion, 5-80 mcg/kg/min, Intravenous, Titrated, Blakeney, DanaDreama Saa, Last Rate: 33.7 mL/hr at 05/08/20 0841, 40 mcg/kg/min at 05/08/20 0841 .  vasopressin (PITRESSIN) 20 Units in sodium chloride 0.9 % 100 mL infusion-*FOR SHOCK*, 0-0.04 Units/min, Intravenous, Continuous, Blakeney, Dana G, NP .  vecuronium (NORCURON) injection 10 mg, 10  mg, Intravenous, Q2H PRN, BlakAwilda Bill, 10 mg at 05/05/20 2059 .  zinc sulfate capsule 220 mg, 220 mg, Per Tube, Daily, BlakAwilda Bill, 220 mg at 05/08/20 0845    ALLERGIES   Patient has no known allergies.    REVIEW OF SYSTEMS    10 point ROS unable to perform due to sedation on MV  PHYSICAL EXAMINATION   Vital Signs: Temp:  [98.2 F (36.8 C)-103.7 F (39.8 C)] 98.2 F (36.8 C) (09/28 0800) Pulse Rate:  [60-99] 69 (09/28 0800) Resp:  [15-35] 35 (09/28 0800) BP: (103-140)/(49-76) 129/61 (09/28 0800) SpO2:  [63 %-100 %] 99 % (09/28 0800) FiO2 (%):  [55 %-100 %] 100 % (09/28 0800) Weight:  [181.3 kg] 181.3 kg (09/28 0500)  GENERAL:NAD HEAD: Normocephalic, atraumatic.  EYES: Pupils equal, round, reactive to light.  No scleral icterus.  MOUTH: Moist mucosal membrane. NECK: Supple. No thyromegaly. No nodules. No JVD.  PULMONARY: rhonchi bilaterally  CARDIOVASCULAR: S1 and S2. Regular rate and rhythm. No murmurs, rubs, or gallops.  GASTROINTESTINAL: Soft, nontender, non-distended. No masses. Positive bowel sounds. No hepatosplenomegaly.  MUSCULOSKELETAL: No swelling, clubbing, or edema.  NEUROLOGIC: Mild distress due to acute illness SKIN:intact,warm,dry   PERTINENT DATA     Infusions: . dexmedetomidine (PRECEDEX) IV infusion 1 mcg/kg/hr (05/08/20 0905)  . famotidine (PEPCID) IV 20 mg (05/08/20 0916)  . fentaNYL infusion INTRAVENOUS 300 mcg/hr (05/08/20 0647)  . insulin    . meropenem (MERREM) IV Stopped (05/08/20 [id number] norepinephrine (LEVOPHED) Adult infusion Stopped (05/06/20 [id number] propofol (DIPRIVAN) infusion 40 mcg/kg/min (05/08/20 0841)  . vasopressin     Scheduled Medications: . vitamin C  500 mg Per Tube Daily  . aspirin  81 mg Per Tube Daily  . chlorhexidine gluconate (MEDLINE KIT)  15 mL Mouth Rinse BID  . Chlorhexidine Gluconate Cloth  6 each Topical Daily  .  dexamethasone (DECADRON) injection  10 mg Intravenous Q12H  . docusate   100 mg Per Tube BID  . enoxaparin (LOVENOX) injection  40 mg Subcutaneous BID  . feeding supplement (PROSource TF)  90 mL Per Tube QID  . feeding supplement (VITAL HIGH PROTEIN)  1,000 mL Per Tube Q24H  . fentaNYL (SUBLIMAZE) injection  50 mcg Intravenous Once  . furosemide  40 mg Intravenous BID  . mouth rinse  15 mL Mouth Rinse 10 times per day  . multivitamin with minerals  1 tablet Per Tube Daily  . polyethylene glycol  17 g Per Tube Daily  . zinc sulfate  220 mg Per Tube Daily   PRN Medications: acetaminophen, dextrose, fentaNYL, vecuronium Hemodynamic parameters:   Intake/Output: 09/27 0701 - 09/28 0700 In: 2434.5 [I.V.:1938.2; IV Piggyback:496.4] Out: 1550 [Urine:1550]  Ventilator  Settings: Vent Mode: PRVC FiO2 (%):  [55 %-100 %] 100 % Set Rate:  [35 bmp] 35 bmp Vt Set:  [440 mL] 440 mL PEEP:  [8 cmH20-20 cmH20] 20 cmH20 Plateau Pressure:  [24 cmH20-31 cmH20] 31 cmH20   Other Labs:     LAB RESULTS:  Basic Metabolic Panel: Recent Labs  Lab 05/03/20 1217 05/03/20 1217 05/03/20 1932 05/05/20 0628 05/05/20 0628 05/06/20 0445 05/06/20 0445 05/07/20 0518 05/08/20 0501  NA 135  --   --  141  --  141  --  144 143  K 3.8   < >  --  4.2   < > 5.1   < > 4.6 4.6  CL 98  --   --  103  --  102  --  105 105  CO2 28  --   --  30  --  29  --  33* 31  GLUCOSE 125*  --   --  214*  --  361*  --  224* 437*  BUN 14  --   --  20  --  28*  --  42* 50*  CREATININE 0.80   < > 0.84 0.80  --  1.19  --  1.83* 1.30*  CALCIUM 8.3*  --   --  8.6*  --  8.1*  --  7.7* 7.7*  MG  --   --   --  2.5*  --  2.8*  --  3.5* 3.5*  PHOS  --   --   --   --   --  3.7  --  2.6 4.7*   < > = values in this interval not displayed.   Liver Function Tests: Recent Labs  Lab 05/03/20 1217 05/08/20 0501  AST 52* 31  ALT 44 40  ALKPHOS 51 45  BILITOT 0.6 1.0  PROT 6.6 5.7*  ALBUMIN 3.7 3.0*   No results for input(s): LIPASE, AMYLASE in the last 168 hours. No results for input(s): AMMONIA in the  last 168 hours. CBC: Recent Labs  Lab 05/04/20 0530 05/05/20 0628 05/06/20 0445 05/07/20 0518 05/08/20 0501  WBC 2.9* 6.2 12.0* 6.8 6.9  NEUTROABS 2.3  --   --   --  5.0  HGB 13.9 13.5 14.2 11.9* 11.4*  HCT 42.9 42.3 44.0 37.6* 37.6*  MCV 84.3 86.0 86.8 88.1 91.0  PLT 142* 168 239 198 189   Cardiac Enzymes: No results for input(s): CKTOTAL, CKMB, CKMBINDEX, TROPONINI in the last 168 hours. BNP: Invalid input(s): POCBNP CBG: Recent Labs  Lab 05/07/20 1948 05/07/20 2159 05/08/20 0021 05/08/20 0405 05/08/20 0746  GLUCAP 252* 276* 309* 349* 379*         IMAGING RESULTS:  Imaging: DG Chest Port 1 View  Result Date: 05/07/2020 CLINICAL DATA:  34-year-old male COVID-19.  Respiratory failure. EXAM: PORTABLE CHEST 1 VIEW COMPARISON:  Portable chest 05/05/2020 and earlier. FINDINGS: Portable AP semi upright view at 0801 hours. Endotracheal tube tip at the level the clavicles. Enteric tube courses to the left upper quadrant, tip not included. Stable left IJ central line. Mildly improved lung volumes. Mediastinal contours remain within normal limits. Coarse bilateral perihilar and basilar predominant opacity persists, but ventilation has mildly improved from prior exams. No pneumothorax. No pleural effusion. Paucity of bowel gas in the upper abdomen. No acute osseous abnormality identified. IMPRESSION: 1. Stable lines and tubes. 2. Mildly improved bilateral ventilation, persistent pneumonia. No new cardiopulmonary abnormality. Electronically Signed   By: H  Hall M.D.   On: 05/07/2020 08:13   @[email or web address]@ No results found.     ASSESSMENT AND PLAN    -Multidisciplinary rounds held today  Acute hypoxic respiratory failure due to COVID-19 ARDS COVID-19 pneumonia Underlying morbid obesity Mechanical intubation  Full vent support for now-vent settings reviewed and established  Mechanical ventilation via ARDS protocol, target PRVC 6 cc/kg Goal plateau pressure less than 30, driving  pressure less than 15 SBT once all parameters met  VAP bundle implemented  Maintain O2 sats 88% or higher Continue remdesivir Continue steroids switched to Decadron 10 mg twice daily>>6 daily -05/08/20 Discontinue baricitinib due to leukopenia and lack of efficacy Follow inflammatory markers Trend WBC and monitor fever curves  Follow cultures  Continue vancomycin and ceftriaxone started-09/25  -9/27- diurese and keep negative fluid balance.  Wean O2 currently at 65%.  Minimize fluid infusions. Patient febrile will start Tylenol                         -net 1550 urine out overnight     ID -continue IV abx as prescibed -follow up cultures  GI/Nutrition GI PROPHYLAXIS as indicated DIET-->TF's as tolerated Constipation protocol as indicated  ENDO - ICU hypoglycemic\Hyperglycemia protocol -check FSBS per protocol   ELECTROLYTES -follow labs as needed -replace as needed -pharmacy consultation   DVT/GI PRX ordered -SCDs  TRANSFUSIONS AS NEEDED MONITOR FSBS ASSESS the need for LABS as needed   Critical care provider statement:    Critical care time (minutes):  33   Critical care time was exclusive of:  Separately billable procedures and treating other patients   Critical care was necessary to treat or prevent imminent or life-threatening deterioration of the following conditions:  ARDS due to COVID19 pneumonia   Critical care was time spent personally by me on the following activities:  Development of treatment plan with patient or surrogate, discussions with consultants, evaluation of patient's response to treatment, examination of patient, obtaining history from patient or surrogate, ordering and performing treatments and interventions, ordering and review of laboratory studies and re-evaluation of patient's condition.  I assumed direction of critical care for this patient from another provider in my specialty: no    This document was prepared using Dragon voice recognition  software and may include unintentional dictation errors.     , M.D.  Division of Pulmonary & Critical Care Medicine  [facility]                                                                                                        

## 2020-05-08 NOTE — Progress Notes (Signed)
Assisted tele visit to patient with family member.  Stark Aguinaga R, RN  

## 2020-05-09 ENCOUNTER — Inpatient Hospital Stay: Payer: BC Managed Care – PPO

## 2020-05-09 ENCOUNTER — Other Ambulatory Visit: Payer: Self-pay

## 2020-05-09 DIAGNOSIS — R000 Tachycardia, unspecified: Secondary | ICD-10-CM

## 2020-05-09 DIAGNOSIS — U071 COVID-19: Secondary | ICD-10-CM | POA: Diagnosis not present

## 2020-05-09 DIAGNOSIS — J9601 Acute respiratory failure with hypoxia: Secondary | ICD-10-CM | POA: Diagnosis not present

## 2020-05-09 LAB — CBC WITH DIFFERENTIAL/PLATELET
Abs Immature Granulocytes: 0.18 10*3/uL — ABNORMAL HIGH (ref 0.00–0.07)
Basophils Absolute: 0 10*3/uL (ref 0.0–0.1)
Basophils Relative: 0 %
Eosinophils Absolute: 0 10*3/uL (ref 0.0–0.5)
Eosinophils Relative: 0 %
HCT: 39.4 % (ref 39.0–52.0)
Hemoglobin: 12.2 g/dL — ABNORMAL LOW (ref 13.0–17.0)
Immature Granulocytes: 2 %
Lymphocytes Relative: 8 %
Lymphs Abs: 0.7 10*3/uL (ref 0.7–4.0)
MCH: 27.2 pg (ref 26.0–34.0)
MCHC: 31 g/dL (ref 30.0–36.0)
MCV: 87.8 fL (ref 80.0–100.0)
Monocytes Absolute: 1 10*3/uL (ref 0.1–1.0)
Monocytes Relative: 12 %
Neutro Abs: 7.1 10*3/uL (ref 1.7–7.7)
Neutrophils Relative %: 78 %
Platelets: 186 10*3/uL (ref 150–400)
RBC: 4.49 MIL/uL (ref 4.22–5.81)
RDW: 15.3 % (ref 11.5–15.5)
WBC: 9 10*3/uL (ref 4.0–10.5)
nRBC: 0.3 % — ABNORMAL HIGH (ref 0.0–0.2)

## 2020-05-09 LAB — COMPREHENSIVE METABOLIC PANEL
ALT: 37 U/L (ref 0–44)
AST: 25 U/L (ref 15–41)
Albumin: 3.1 g/dL — ABNORMAL LOW (ref 3.5–5.0)
Alkaline Phosphatase: 44 U/L (ref 38–126)
Anion gap: 6 (ref 5–15)
BUN: 50 mg/dL — ABNORMAL HIGH (ref 6–20)
CO2: 37 mmol/L — ABNORMAL HIGH (ref 22–32)
Calcium: 8.2 mg/dL — ABNORMAL LOW (ref 8.9–10.3)
Chloride: 108 mmol/L (ref 98–111)
Creatinine, Ser: 0.97 mg/dL (ref 0.61–1.24)
GFR calc Af Amer: >60 mL/min (ref >60)
GFR calc non Af Amer: >60 mL/min (ref >60)
Glucose, Bld: 217 mg/dL — ABNORMAL HIGH (ref 70–99)
Potassium: 4.7 mmol/L (ref 3.5–5.1)
Sodium: 151 mmol/L — ABNORMAL HIGH (ref 135–145)
Total Bilirubin: 0.8 mg/dL (ref 0.3–1.2)
Total Protein: 5.6 g/dL — ABNORMAL LOW (ref 6.5–8.1)

## 2020-05-09 LAB — GLUCOSE, CAPILLARY
Glucose-Capillary: 123 mg/dL — ABNORMAL HIGH (ref 70–99)
Glucose-Capillary: 126 mg/dL — ABNORMAL HIGH (ref 70–99)
Glucose-Capillary: 130 mg/dL — ABNORMAL HIGH (ref 70–99)
Glucose-Capillary: 144 mg/dL — ABNORMAL HIGH (ref 70–99)
Glucose-Capillary: 152 mg/dL — ABNORMAL HIGH (ref 70–99)
Glucose-Capillary: 153 mg/dL — ABNORMAL HIGH (ref 70–99)
Glucose-Capillary: 156 mg/dL — ABNORMAL HIGH (ref 70–99)
Glucose-Capillary: 157 mg/dL — ABNORMAL HIGH (ref 70–99)
Glucose-Capillary: 160 mg/dL — ABNORMAL HIGH (ref 70–99)
Glucose-Capillary: 167 mg/dL — ABNORMAL HIGH (ref 70–99)
Glucose-Capillary: 167 mg/dL — ABNORMAL HIGH (ref 70–99)
Glucose-Capillary: 172 mg/dL — ABNORMAL HIGH (ref 70–99)
Glucose-Capillary: 173 mg/dL — ABNORMAL HIGH (ref 70–99)
Glucose-Capillary: 179 mg/dL — ABNORMAL HIGH (ref 70–99)
Glucose-Capillary: 180 mg/dL — ABNORMAL HIGH (ref 70–99)
Glucose-Capillary: 184 mg/dL — ABNORMAL HIGH (ref 70–99)
Glucose-Capillary: 190 mg/dL — ABNORMAL HIGH (ref 70–99)
Glucose-Capillary: 190 mg/dL — ABNORMAL HIGH (ref 70–99)
Glucose-Capillary: 202 mg/dL — ABNORMAL HIGH (ref 70–99)
Glucose-Capillary: 209 mg/dL — ABNORMAL HIGH (ref 70–99)
Glucose-Capillary: 212 mg/dL — ABNORMAL HIGH (ref 70–99)

## 2020-05-09 LAB — CULTURE, RESPIRATORY W GRAM STAIN: Culture: NORMAL

## 2020-05-09 LAB — TROPONIN I (HIGH SENSITIVITY): Troponin I (High Sensitivity): 67 ng/L — ABNORMAL HIGH (ref <18)

## 2020-05-09 LAB — PHOSPHORUS: Phosphorus: 5.3 mg/dL — ABNORMAL HIGH (ref 2.5–4.6)

## 2020-05-09 LAB — POTASSIUM
Potassium: 4.6 mmol/L (ref 3.5–5.1)
Potassium: 4.2 mmol/L (ref 3.5–5.1)

## 2020-05-09 IMAGING — DX DG CHEST 1V PORT
1 series · 2 of 2 positions shown · non-contrast
Comparison: [DATE], [DATE], [DATE]

CLINICAL DATA: Respiratory failure

EXAM:
PORTABLE CHEST 1 VIEW

[Series 1: chest ap · 0.14mm/px · 2 of 2 slices shown]
[im 1/2]
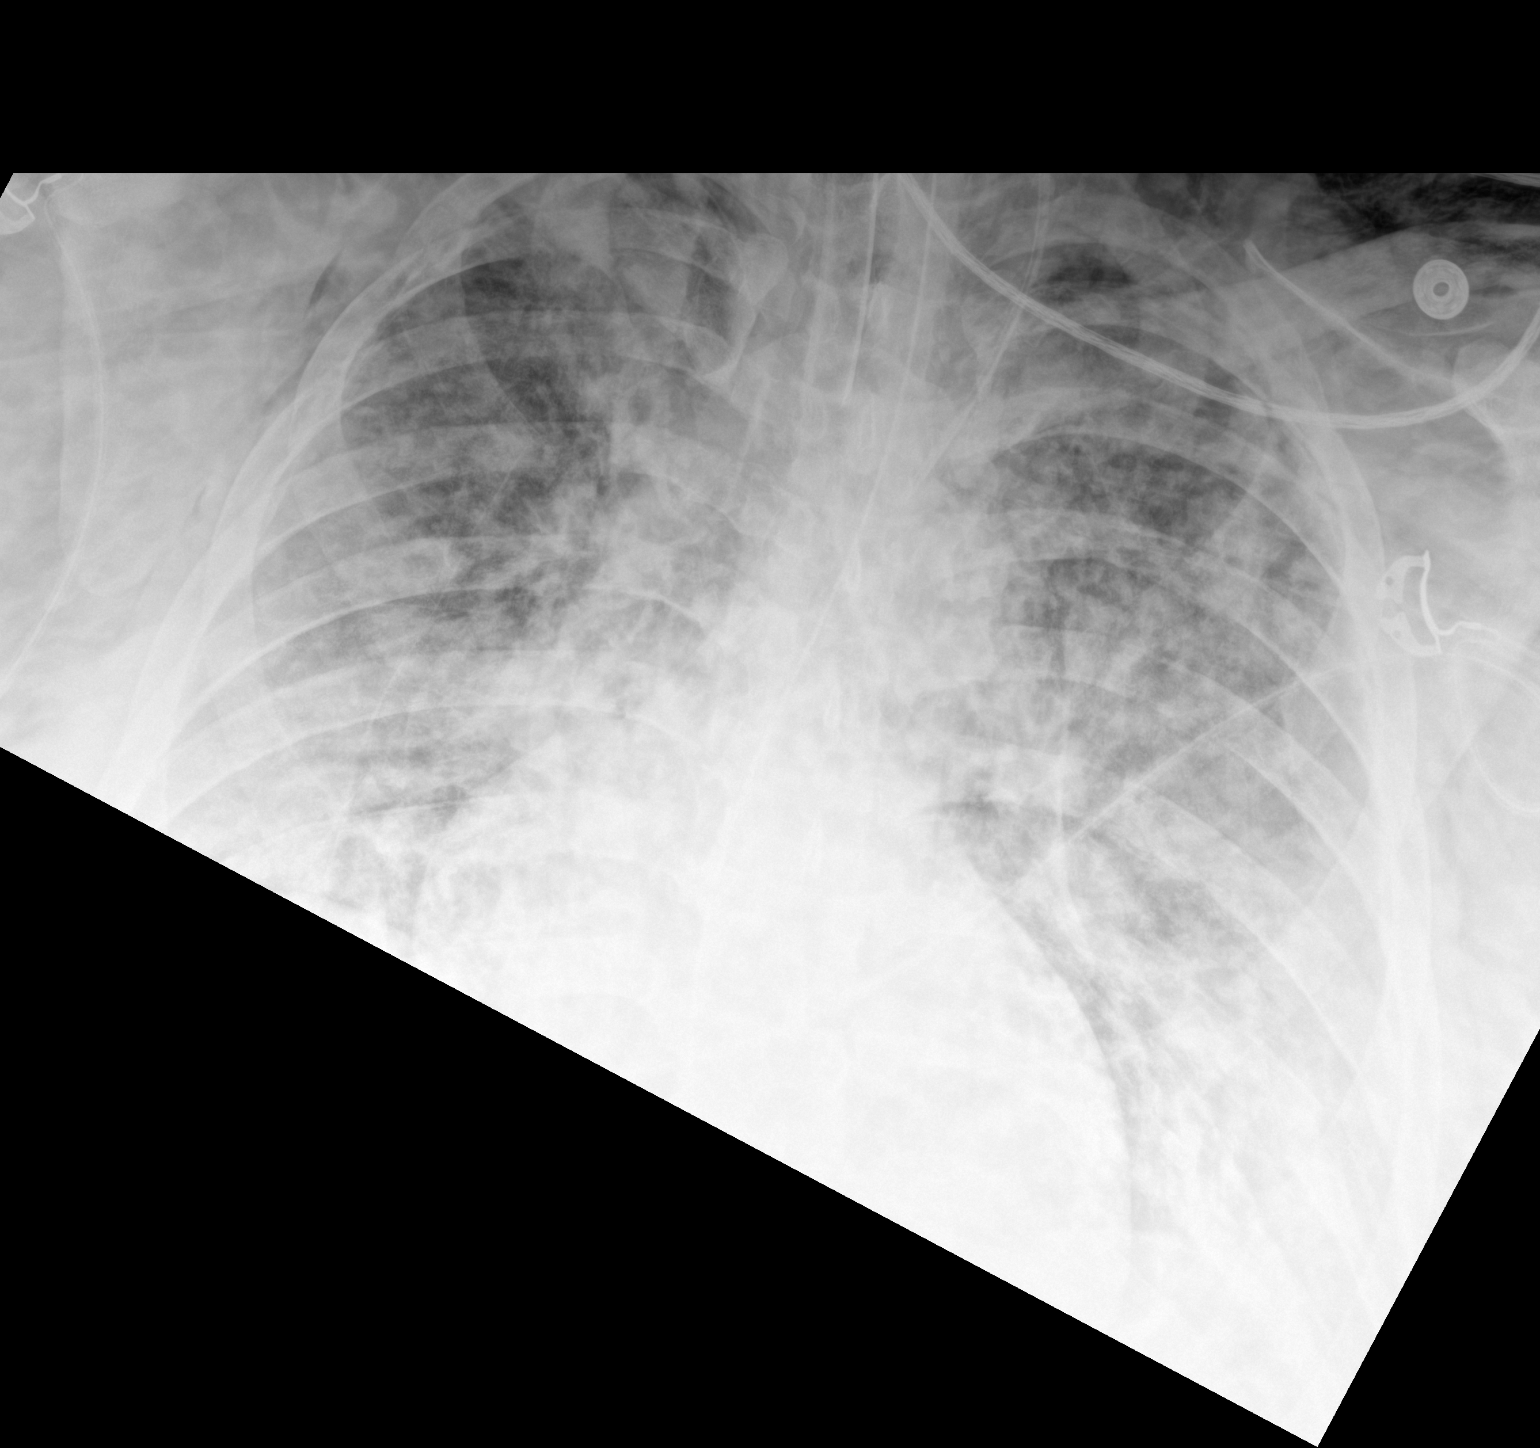
[im 2/2]
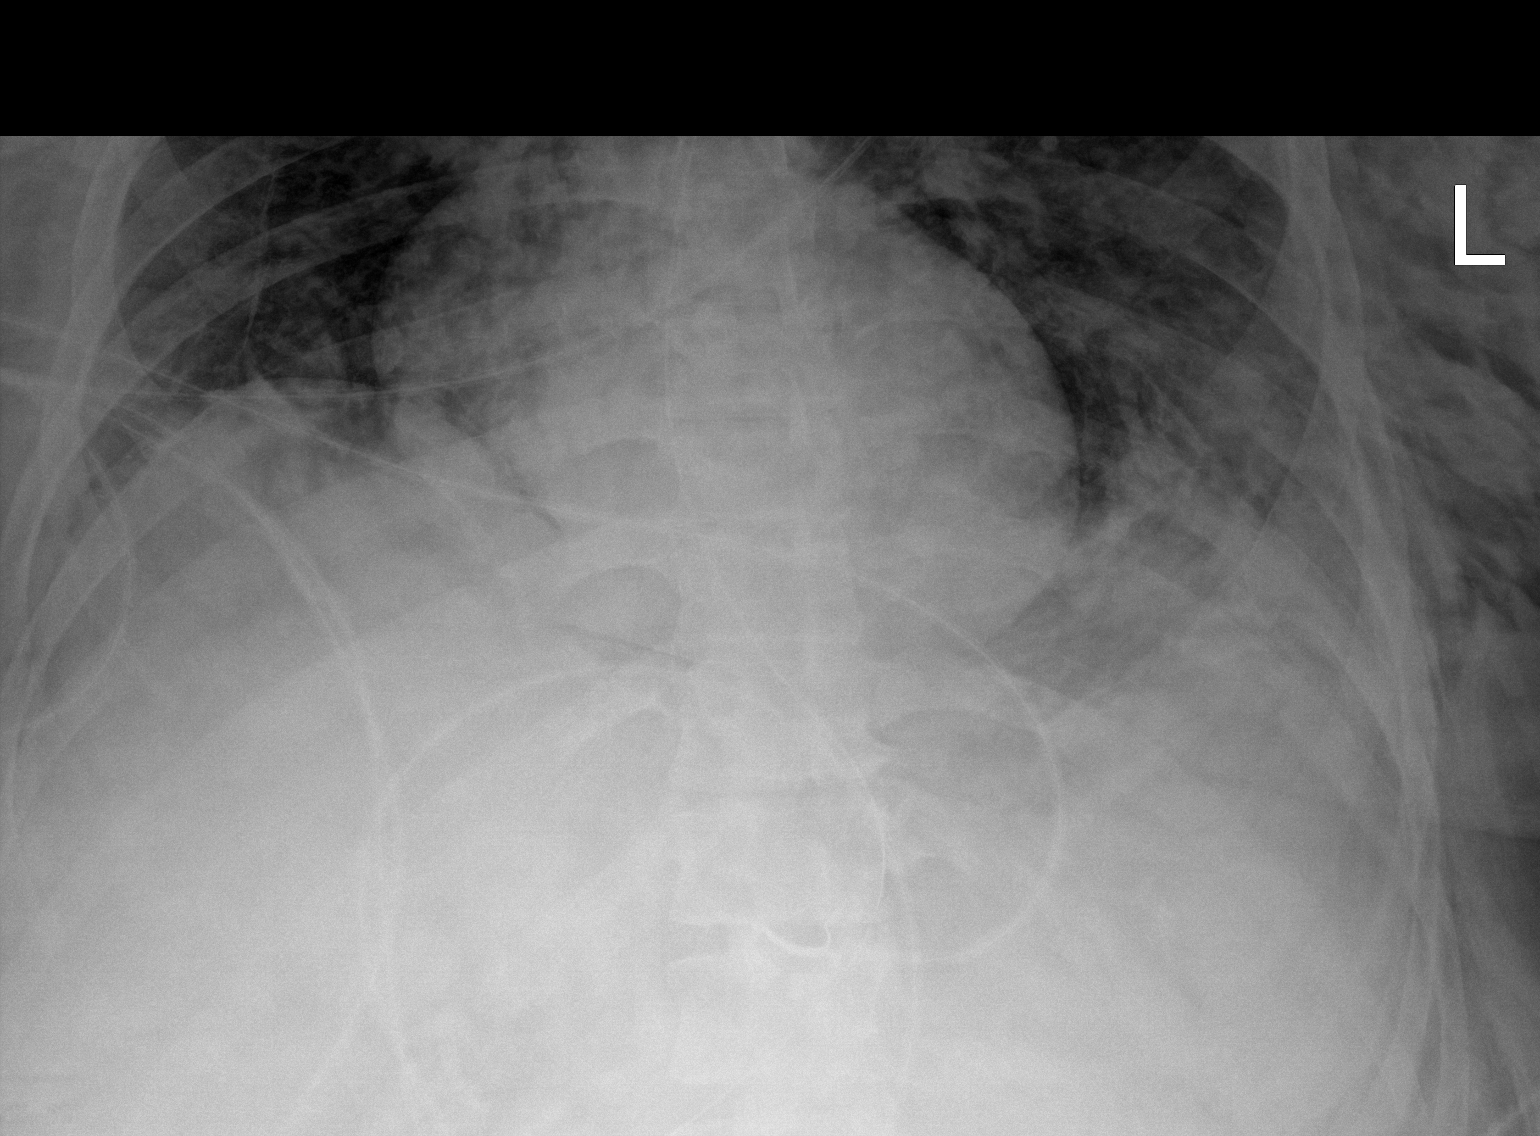

[2 of 2 positions shown; findings below may reference images not displayed]

FINDINGS: Endotracheal tube tip is about 3.6 cm superior to the carina. Left
IJ central venous catheter similar in position. Esophageal tube tip
is below the diaphragm but incompletely visualized. Extensive
bilateral pulmonary airspace disease with worsening compared to
prior radiograph. Interim development of extensive subcutaneous
emphysema at the chest and upper abdomen. Possible
pneumomediastinum. Possible trace right apical pneumothorax.
IMPRESSION: 1. Endotracheal tube tip about 3.6 cm superior to the carina.
2. Interim development of extensive subcutaneous emphysema
throughout the chest and upper abdomen. Probable pneumomediastinum.
Possible trace right apical pneumothorax. Attention on follow-up
radiography.
3. Worsening of extensive bilateral pulmonary airspace disease.

## 2020-05-09 MED ORDER — FREE WATER
200.0000 mL | Freq: Four times a day (QID) | Status: DC
  Administered 2020-05-09 (×2): 200 mL

## 2020-05-09 MED ORDER — ACETAZOLAMIDE 250 MG PO TABS
500.0000 mg | ORAL_TABLET | Freq: Once | ORAL | Status: AC
  Administered 2020-05-09: 500 mg
  Filled 2020-05-09: qty 2

## 2020-05-09 MED ORDER — MIDAZOLAM HCL 50 MG/10ML IJ SOLN
0.5000 mg/h | INTRAVENOUS | Status: DC
  Administered 2020-05-09: 5 mg/h via INTRAVENOUS
  Administered 2020-05-10 – 2020-05-12 (×14): 10 mg/h via INTRAVENOUS
  Administered 2020-05-13 (×4): 15 mg/h via INTRAVENOUS
  Administered 2020-05-13: 13 mg/h via INTRAVENOUS
  Administered 2020-05-13 – 2020-05-14 (×2): 15 mg/h via INTRAVENOUS
  Administered 2020-05-14: 12 mg/h via INTRAVENOUS
  Administered 2020-05-14: 10 mg/h via INTRAVENOUS
  Administered 2020-05-14 – 2020-05-15 (×4): 15 mg/h via INTRAVENOUS
  Administered 2020-05-15: 10 mg/h via INTRAVENOUS
  Administered 2020-05-15: 12 mg/h via INTRAVENOUS
  Administered 2020-05-15: 15 mg/h via INTRAVENOUS
  Administered 2020-05-15: 10 mg/h via INTRAVENOUS
  Administered 2020-05-16 (×2): 12 mg/h via INTRAVENOUS
  Administered 2020-05-16: 15 mg/h via INTRAVENOUS
  Administered 2020-05-16: 12 mg/h via INTRAVENOUS
  Administered 2020-05-16 – 2020-05-18 (×9): 15 mg/h via INTRAVENOUS
  Administered 2020-05-18: 10 mg/h via INTRAVENOUS
  Administered 2020-05-18: 15 mg/h via INTRAVENOUS
  Filled 2020-05-09 (×48): qty 10

## 2020-05-09 MED ORDER — VECURONIUM BROMIDE 10 MG IV SOLR
10.0000 mg | INTRAVENOUS | Status: DC | PRN
  Administered 2020-05-09 – 2020-05-17 (×22): 10 mg via INTRAVENOUS
  Filled 2020-05-09 (×24): qty 10

## 2020-05-09 MED ORDER — METOPROLOL TARTRATE 5 MG/5ML IV SOLN
INTRAVENOUS | Status: AC
  Filled 2020-05-09: qty 5

## 2020-05-09 MED ORDER — FREE WATER
300.0000 mL | Status: DC
  Administered 2020-05-10 – 2020-05-17 (×44): 300 mL

## 2020-05-09 MED ORDER — VECURONIUM BROMIDE 10 MG IV SOLR
10.0000 mg | Freq: Once | INTRAVENOUS | Status: AC
  Administered 2020-05-09: 10 mg via INTRAVENOUS

## 2020-05-09 MED ORDER — GLYCOPYRROLATE 0.2 MG/ML IJ SOLN
0.1000 mg | Freq: Three times a day (TID) | INTRAMUSCULAR | Status: DC
  Administered 2020-05-09: 0.1 mg via INTRAVENOUS
  Filled 2020-05-09: qty 1

## 2020-05-09 MED ORDER — FUROSEMIDE 10 MG/ML IJ SOLN
80.0000 mg | Freq: Two times a day (BID) | INTRAMUSCULAR | Status: DC
  Administered 2020-05-10 – 2020-05-14 (×9): 80 mg via INTRAVENOUS
  Filled 2020-05-09 (×9): qty 8

## 2020-05-09 MED ORDER — FUROSEMIDE 10 MG/ML IJ SOLN
40.0000 mg | Freq: Once | INTRAMUSCULAR | Status: AC
  Administered 2020-05-09: 40 mg via INTRAVENOUS
  Filled 2020-05-09: qty 4

## 2020-05-09 MED ORDER — STERILE WATER FOR INJECTION IJ SOLN
INTRAMUSCULAR | Status: AC
  Administered 2020-05-09: 10 mL
  Filled 2020-05-09: qty 10

## 2020-05-09 MED ORDER — SODIUM CHLORIDE 0.9 % IV SOLN
0.5000 mg/h | INTRAVENOUS | Status: DC
  Filled 2020-05-09: qty 10

## 2020-05-09 NOTE — Progress Notes (Addendum)
Called at bedside due pt developing worsening hypoxia with O2 sats sustaining @80 % on the following vent settings: pressure control rate 40, PEEP 10, and FiO2 @100 %.  Therefore, instructed respiratory therapist to change vent settings to PRVC rate 35, PEEP 20, TV 460 for now, and FiO2 @100 %.  With vent changes along with recruitment maneuver x1 pts O2 sats currently @99 %.  CXR reviewed by Dr. and bedside performed, however due to body habitus difficult to r/o pneumothorax.  Once pt stable for transport will obtain CTA Chest to r/o pulmonary embolism and pneumothorax.  ABG post ventilator changes revealed PF ratio of 73, will prone pt following CTA Chest.  Pt also noted to have a brief period of bradycardia hr 20's, however resolved spontaneously which is highly suspicious for vasovagal episode.  Echo, troponin, and stat EKG ordered.  Will also give diamox due to continued pulmonary edema despite scheduled lasix.  Case discussed with ICU Intensivist Dr. who arrived at bedside to assess pt, insert a right radial arterial line, and performed bedside ultrasound to assess for pneumothorax.  Will continue to monitor and assess pt.    , AGNP  Pulmonary/Critical Care Pager [phone] (please enter 7 digits) PCCM Consult Pager [phone] (please enter 7 digits)      Agree with assessment and plan by Coralie Common, AGNP as noted above.  Blood pressure 120/60, pulse (!) 124, temperature 99.7 F (37.6 C), temperature source Esophageal, resp. rate (!) 35, height 5\' 10"  (1.778 m), weight (!) 181.5 kg, SpO2 98 %.  Transient bradycardia: His bradycardia is very transient, and he recovered to tachycardia in the low 100s.  It appears to be related to transient increased vagal tone and less likely any sort of tachybrady syndrome.  He has had about 2 episodes of the bradycardia this shift, not associated with hypotension.  We will like to obtain an EKG to identify any heart block.   We will have Atropine 1mg  vial available bedside.  Discontinued Rubinul due to tachycardia.   Hypoxia: PF ratio of 73 on 100% FiO2.  He was on PC 40/10 which led to very high mean airway pressure with risk of barotrauma.  Given, that, he was changed to [facility] for better lung protective ventilation strategies.  We will permit hypercapnia to achieve lung protective ventilation. He will need proning after adequately sedated and paralyzed given severe ARDS.  Pneumomediastinum: Likely worsening due to high mean airway pressures.  Does not appear to be causing organ dysfunction.  Has some moderate subcutaneous emphysema in the neck and chest wall areas.  Obtain CT chest to evaluate for potential pneumothorax.   Persistent fevers:   Sputum neg.  We will obtain blood cultures including from central line, urinalysis.  Has 7 episodes of loose stools but nurse states he received laxative yesterday morning thus will hold off on sending for Cdiff, but if diarrhea continues into daytime tomorrow, should consider.  Obtain CT chest to evaluate for new consolidation.  POCUS: Bedside ultrasound performed.  Poor window due to body habitus.  Relatively good lung slides noted bilaterally, but less certain on the right side apical area.  Some B-lines noted bilaterally suggesting interstitial edema.    Metabolic alkalosis: Baseline bicarb appears to be around 31, currently higher likely due to contraction alkalosis from Lasix.  We will dose with Diamox and continue to observe.   Hypernatremia: Has free water deficit, thus increased free water flushes to 300 mL q 4 hours.   Total Critical  Care Time:  55 minutes ___________________ Drue Stager. Coralie Common, MD [facility]

## 2020-05-09 NOTE — Consult Note (Signed)
PHARMACY CONSULT NOTE  Pharmacy Consult for Electrolyte Monitoring and Replacement   Recent Labs: Potassium (mmol/L)  Date Value  05/09/2020 4.2   Magnesium (mg/dL)  Date Value  09/81/1914 3.5 (H)   Calcium (mg/dL)  Date Value  78/29/5621 8.2 (L)   Albumin (g/dL)  Date Value  30/86/5784 3.1 (L)   Phosphorus (mg/dL)  Date Value  69/62/9528 5.3 (H)   Sodium (mmol/L)  Date Value  05/09/2020 151 (H)  Corrected Calcium: 8.2  Assessment: Pharmacy has been consulted for electrolyte replacement in this 34 YOM requiring intubation and admission to the CCU for COVID PNA. Treatment including IV decadron, leading to the need for high amounts of insulin and eventually an insulin drip per the diabetes coordinator.  9/28 Electrolytes WNL with mag and phos elevated, possibly due to bump in Scr yesterday which has improved from 1.83>>1.30 and good UOP. Pt is receiving tube feeds and no maintenance fluids at this time.   Goal of Therapy:  Electrolytes WNL  Plan:  Potassium relatively stable despite insulin drip (8.5 units/hr) and Lasix. Dose of Lasix increased today and started on free water flushes for hypernatremia. Will recheck electrolytes tomorrow AM. Doesn't appear to need as frequent monitoring; will address as necessary.  Tressie Ellis 05/09/2020 10:34 PM

## 2020-05-09 NOTE — Progress Notes (Signed)
Inpatient Diabetes Program Recommendations  AACE/ADA: New Consensus Statement on Inpatient Glycemic Control (2015)  Target Ranges:  Prepandial:   less than 140 mg/dL      Peak postprandial:   less than 180 mg/dL (1-2 hours)      Critically ill patients:  140 - 180 mg/dL   Lab Results  Component Value Date   GLUCAP 156 (H) 05/09/2020   HGBA1C 7.2 (H) 05/03/2020    Review of Glycemic Control Results for Collin Brown, Collin Brown (MRN [id number]) as of 05/09/2020 12:30  Ref. Range 05/09/2020 06:47 05/09/2020 07:51 05/09/2020 09:24 05/09/2020 10:24 05/09/2020 11:18  Glucose-Capillary Latest Ref Range: 70 - 99 mg/dL 829 (H) 937 (H) 169 (H) 144 (H) 156 (H)   Diabetes history: None however A1C on admit was 7.2%- ? New diagnosis Outpatient Diabetes medications: None Current orders for Inpatient glycemic control:  ICU glycemic control order set- Phase 2: IV insulin Vital at 40 cc/ hr Decadron 6 mg q 24 hours  Inpatient Diabetes Program Recommendations:    Note that blood sugars much better with IV insulin.  Patient is requiring>10 units/hr of IV insulin.  Therefore not ready for transition to SQ insulin at this time per protocol.  Will follow.   Thanks,  Beryl Meager, RN, BC-ADM Inpatient Diabetes Coordinator Pager [phone] (8a-5p)

## 2020-05-09 NOTE — Consult Note (Signed)
PHARMACY CONSULT NOTE  Pharmacy Consult for Electrolyte Monitoring and Replacement   Recent Labs: Potassium (mmol/L)  Date Value  05/09/2020 4.7   Magnesium (mg/dL)  Date Value  17/61/6073 3.5 (H)   Calcium (mg/dL)  Date Value  71/01/2693 8.2 (L)   Albumin (g/dL)  Date Value  85/46/2703 3.1 (L)   Phosphorus (mg/dL)  Date Value  50/04/3817 5.3 (H)   Sodium (mmol/L)  Date Value  05/09/2020 151 (H)  Corrected Calcium: 8.2  Assessment: Pharmacy has been consulted for electrolyte replacement in this 34 YOM requiring intubation and admission to the CCU for COVID PNA. Treatment including IV decadron, leading to the need for high amounts of insulin and eventually an insulin drip per the diabetes coordinator.  9/28 Electrolytes WNL with mag and phos elevated, possibly due to bump in Scr yesterday which has improved from 1.83>>1.30 and good UOP. Pt is receiving tube feeds and no maintenance fluids at this time.   Goal of Therapy:  Electrolytes WNL  Plan:  Potassium relatively stable despite insulin drip and Lasix. Dose of Lasix increased today and started on free water flushes for hypernatremia. Will recheck potassium at 1600 and continue to follow for necessary replacement. Doesn't appear to need as frequent monitoring; will address as necessary.  Pricilla Riffle, PharmD Clinical Pharmacist 05/09/2020 2:34 PM

## 2020-05-09 NOTE — Consult Note (Signed)
PHARMACY CONSULT NOTE  Pharmacy Consult for Electrolyte Monitoring and Replacement   Recent Labs: Potassium (mmol/L)  Date Value  05/09/2020 4.7   Magnesium (mg/dL)  Date Value  06/07/2535 3.5 (H)   Calcium (mg/dL)  Date Value  64/40/3474 8.2 (L)   Albumin (g/dL)  Date Value  25/95/6387 3.1 (L)   Phosphorus (mg/dL)  Date Value  56/43/3295 5.3 (H)   Sodium (mmol/L)  Date Value  05/09/2020 151 (H)  Corrected Calcium: 8.2  Assessment: Pharmacy has been consulted for electrolyte replacement in this 34 YOM requiring intubation and admission to the CCU for COVID PNA. Treatment including IV decadron, leading to the need for high amounts of insulin and eventually an insulin drip per the diabetes coordinator.  9/28 Electrolytes WNL with mag and phos elevated, possibly due to bump in Scr yesterday which has improved from 1.83>>1.30 and good UOP. Pt on day 6 of IV steroids with uncontrolled hyperglycemia requiring insulin drip which was started @1122 .  Pt is receiving tube feeds and no maintenance fluids at this time.   Goal of Therapy:  Electrolytes WNL  Plan:  -0430 K = 4.7. Phos 5.3.  No replacement required at this time. Remains on insulin drip.  Patient is also on IV Lasix BID -Recheck electrolytes in 4 hours  A 05/09/2020 6:32 AM

## 2020-05-09 NOTE — Progress Notes (Signed)
CRITICAL CARE PROGRESS NOTE    Name: Collin Brown MRN: [id number] DOB: 11/15/1985     LOS: 6   SUBJECTIVE FINDINGS & SIGNIFICANT EVENTS    Patient description:  34 yo with OSA, morbid obesity BMI>56 came in with flu like illness diagnosed with COVID19 pneumonia was initially ion Medical floor with hospitalist service initiated Remdesevir and barictitinib, he progressively became more dyspneic he was placed on BIPAP and did not tolerate then placed on HFNC did not tolerate patient himself consented to MV and was intubated in MICU. Mother was notified who is sick at home with Lares.   Events:  9/27- patient on 65%FiO2 with proning protocol. CVP trending, goal negative fluid balance.   05/08/20- patient with worsening ventilation increased FiO2 to 100% overnight and had episode of severe bradycardia.  Concern for mucus plugging will perform recruitment maneuvers and possible bronchoscopy.  We were able to perform tracheal aspirate and have weaned down fIO2 to 75% 05/09/20- patient had bradycardic episode and he desaturated overnight with temporary increase to 100FiO2.  Lasix q12 going. I spoke to brother today for update.  His tracheal aspirate came back normal flora so we will stop anbitibiotic.       Lines/tubes  9/25 - Left IJ placed  : Airway 8 mm (Active)  Secured at (cm) 26 cm 05/07/20 0801  Measured From Lips 05/07/20 [place] 05/07/20 0801  Secured By [employer] 05/07/20 0801  Tube Holder Repositioned Yes 05/07/20 0801  Cuff Pressure (cm H2O) 26 cm H2O 05/07/20 0801  Site Condition Dry 05/07/20 0801     CVC Triple Lumen 05/05/20 Left Internal jugular (Active)  Indication for Insertion or Continuance of Line Vasoactive infusions;Poor Vasculature-patient has had  multiple peripheral attempts or PIVs lasting less than 24 hours 05/06/20 2130  Site Assessment Clean;Dry;Intact 05/06/20 2130  Proximal Lumen Status Infusing 05/06/20 2130  Medial Lumen Status Infusing 05/06/20 2130  Distal Lumen Status Infusing 05/06/20 2130  Dressing Type Transparent;Occlusive 05/06/20 2130  Dressing Status Clean;Dry;Intact 05/06/20 2130  Antimicrobial disc in place? Yes 05/06/20 2130  Line Care Connections checked and tightened 05/06/20 2130  Dressing Intervention Dressing reinforced 05/06/20 0900  Dressing Change Due 05/12/20 05/06/20 2130     NG/OG Tube Orogastric Center mouth Xray 65 cm (Active)  Cm Marking at Nare/Corner of Mouth (if applicable) 65 cm 41/66/06 2130  Site Assessment Clean;Dry;Intact 05/06/20 2130  Ongoing Placement Verification No change in cm markings or external length of tube from initial placement;No change in respiratory status;No acute changes, not attributed to clinical condition;Xray 05/06/20 2130  Status Clamped 05/06/20 2130     Urethral Catheter Donnald Garre, RN 16 Fr. (Active)  Indication for Insertion or Continuance of Catheter Unstable critically ill patients first 24-48 hours (See Criteria) 05/06/20 2000  Site Assessment Clean;Intact;Prolapse;Swelling 05/06/20 2000  Catheter Maintenance Bag below level of bladder;Catheter secured;Drainage bag/tubing not touching floor;Insertion date on drainage bag;No dependent loops;Seal intact 05/06/20 2000  Collection Container Standard drainage bag 05/06/20 2000  Securement Method Securing device (Describe) 05/06/20 2000  Urinary Catheter Interventions (if applicable) Unclamped 30/16/01 2000  Output (mL) 100 mL 05/07/20 0445    Microbiology/Sepsis markers: Results for orders placed or performed during the hospital encounter of 05/03/20  Blood Culture (routine x 2)     Status: None   Collection Time: 05/03/20  4:33 PM   Specimen: BLOOD  Result Value Ref Range Status   Specimen Description BLOOD  BLOOD LEFT HAND  Final  Special Requests   Final    BOTTLES DRAWN AEROBIC AND ANAEROBIC Blood Culture results may not be optimal due to an inadequate volume of blood received in culture bottles   Culture   Final    NO GROWTH 5 DAYS Performed at [facility], [address] [place], [place] [address]    Report Status 05/08/2020 FINAL  Final  Blood Culture (routine x 2)     Status: None   Collection Time: 05/03/20  4:33 PM   Specimen: BLOOD  Result Value Ref Range Status   Specimen Description BLOOD BLOOD RIGHT HAND  Final   Special Requests   Final    BOTTLES DRAWN AEROBIC AND ANAEROBIC Blood Culture adequate volume   Culture   Final    NO GROWTH 5 DAYS Performed at [facility], [address] [place], [place] [address]    Report Status 05/08/2020 FINAL  Final  Respiratory Panel by RT PCR (Flu A&B, Covid) -     Status: Abnormal   Collection Time: 05/03/20  7:32 PM  Result Value Ref Range Status   SARS Coronavirus 2 by RT PCR POSITIVE (A) NEGATIVE Final    Comment: RESULT CALLED TO, READ BACK BY AND VERIFIED WITH: Leonides Schanz RN 2114 05/03/20 HNM/AR    Influenza A by PCR NEGATIVE NEGATIVE Final   Influenza B by PCR NEGATIVE NEGATIVE Final    Comment: Performed at [facility], [address] [place], [place] [address]  MRSA PCR Screening     Status: None   Collection Time: 05/06/20 11:51 AM   Specimen: Nasal Mucosa; Nasopharyngeal  Result Value Ref Range Status   MRSA by PCR NEGATIVE NEGATIVE Final    Comment:        The GeneXpert MRSA Assay (FDA approved for NASAL specimens only), is one component of a comprehensive MRSA colonization surveillance program. It is not intended to diagnose MRSA infection nor to guide or monitor treatment for MRSA infections. Performed at [facility], [address] [place], [place] [address]   Culture, respiratory (non-expectorated)     Status: None   Collection Time: 05/07/20 10:55 AM    Specimen: Tracheal Aspirate; Respiratory  Result Value Ref Range Status   Specimen Description   Final    TRACHEAL ASPIRATE Performed at [facility], [address] [place], [place] [address]    Special Requests   Final    NONE Performed at [facility], [place], [place] [address]    Gram Stain   Final    RARE WBC PRESENT,BOTH PMN AND MONONUCLEAR NO ORGANISMS SEEN    Culture   Final    RARE Normal respiratory flora-no Staph aureus or Pseudomonas seen Performed at [facility] [address] [place], [place] [address]    Report Status 05/09/2020 FINAL  Final    Anti-infectives:  Anti-infectives (From admission, onward)   Start     Dose/Rate Route Frequency Ordered Stop   05/07/20 1200  meropenem (MERREM) 1 g in sodium chloride 0.9 % 100 mL IVPB        1 g 200 mL/hr over 30 Minutes Intravenous Every 8 hours 05/07/20 1020     05/06/20 1200  vancomycin (VANCOCIN) IVPB 1000 mg/200 mL premix  Status:  Discontinued        1,000 mg 200 mL/hr over 60 Minutes Intravenous Every 8 hours 05/05/20 2330 05/07/20 1020   05/06/20 0200  vancomycin (VANCOREADY) IVPB 500 mg/100 mL       "  Followed by" Linked Group Details   500 mg 100 mL/hr over 60 Minutes Intravenous  Once 05/05/20 2330 05/06/20 0454   05/06/20 0000  vancomycin (VANCOREADY) IVPB 2000 mg/400 mL       "Followed by" Linked Group Details   2,000 mg 200 mL/hr over 120 Minutes Intravenous  Once 05/05/20 2330 05/06/20 0304   05/05/20 2315  cefTRIAXone (ROCEPHIN) 2 g in sodium chloride 0.9 % 100 mL IVPB  Status:  Discontinued        2 g 200 mL/hr over 30 Minutes Intravenous Daily at 10 pm 05/05/20 2307 05/07/20 1020   05/04/20 1000  remdesivir 100 mg in sodium chloride 0.9 % 100 mL IVPB       "Followed by" Linked Group Details   100 mg 200 mL/hr over 30 Minutes Intravenous Daily 05/03/20 1718 05/07/20 0941   05/03/20 1800  remdesivir 200 mg in sodium chloride 0.9% 250 mL IVPB        "Followed by" Linked Group Details   200 mg 580 mL/hr over 30 Minutes Intravenous Once 05/03/20 1718 05/03/20 1941       Consults: Treatment Team:  Pccm, Ander Gaster, MD     PAST MEDICAL HISTORY   Past Medical History:  Diagnosis Date  . Hearing loss in right ear   . Sleep apnea      SURGICAL HISTORY   Past Surgical History:  Procedure Laterality Date  . TONSILLECTOMY       FAMILY HISTORY   Family History  Problem Relation Age of Onset  . Diabetes Mellitus II Mother   . Hypertension Mother   . Diabetes Mellitus II Father   . High blood pressure Father      SOCIAL HISTORY   Social History   Tobacco Use  . Smoking status: Never Smoker  . Smokeless tobacco: Never Used  Vaping Use  . Vaping Use: Never used  Substance Use Topics  . Alcohol use: No  . Drug use: No     MEDICATIONS   Current Medication:  Current Facility-Administered Medications:  .  acetaminophen (TYLENOL) tablet 1,000 mg, 1,000 mg, Oral, Q6H PRN, Sharion Settler, NP, 1,000 mg at 05/07/20 0907 .  ascorbic acid (VITAMIN C) tablet 500 mg, 500 mg, Per Tube, Daily, Awilda Bill, NP, 500 mg at 05/09/20 0928 .  aspirin chewable tablet 81 mg, 81 mg, Per Tube, Daily, Awilda Bill, NP, 81 mg at 05/09/20 0928 .  chlorhexidine gluconate (MEDLINE KIT) (PERIDEX) 0.12 % solution 15 mL, 15 mL, Mouth Rinse, BID, Tyler Pita, MD, 15 mL at 05/09/20 0929 .  Chlorhexidine Gluconate Cloth 2 % PADS 6 each, 6 each, Topical, Daily, Tyler Pita, MD, 6 each at 05/09/20 0930 .  dexamethasone (DECADRON) injection 6 mg, 6 mg, Intravenous, Q24H, Lanney Gins, , MD, 6 mg at 05/09/20 0927 .  dextrose 50 % solution 0-50 mL, 0-50 mL, Intravenous, PRN, Lanney Gins, , MD .  docusate (COLACE) 50 MG/5ML liquid 100 mg, 100 mg, Per Tube, BID, Awilda Bill, NP, 100 mg at 05/09/20 0928 .  enoxaparin (LOVENOX) injection 40 mg, 40 mg, Subcutaneous, BID, Florina Ou V, MD, 40 mg at 05/09/20 0929 .   famotidine (PEPCID) IVPB 20 mg premix, 20 mg, Intravenous, Q12H, Tyler Pita, MD, Last Rate: 100 mL/hr at 05/09/20 0916, 20 mg at 05/09/20 0916 .  feeding supplement (PROSource TF) liquid 90 mL, 90 mL, Per Tube, QID, , , MD, 90 mL at 05/09/20 0928 .  feeding supplement (VITAL HIGH  PROTEIN) liquid 1,000 mL, 1,000 mL, Per Tube, Q24H, Cherell Colvin, MD, 1,000 mL at 05/09/20 0931 .  fentaNYL (SUBLIMAZE) bolus via infusion 50 mcg, 50 mcg, Intravenous, Q15 min PRN, Awilda Bill, NP .  fentaNYL (SUBLIMAZE) injection 50 mcg, 50 mcg, Intravenous, Once, Blakeney, Dreama Saa, NP .  fentaNYL 2560mg in NS 2559m(1055mml) infusion-PREMIX, 0-400 mcg/hr, Intravenous, Continuous, ChaBenita GutterPH, Last Rate: 40 mL/hr at 05/09/20 0732, 400 mcg/hr at 05/09/20 0732 .  furosemide (LASIX) injection 40 mg, 40 mg, Intravenous, BID, AleLanney Ginsuad, MD, 40 mg at 05/09/20 0928 .  insulin regular, human (MYXREDLIN) 100 units/ 100 mL infusion, , Intravenous, Continuous, Delmos Velaquez, MD, Last Rate: 12 mL/hr at 05/09/20 0925, 12 Units/hr at 05/09/20 0925 .  MEDLINE mouth rinse, 15 mL, Mouth Rinse, 10 times per day, GonTyler PitaD, 15 mL at 05/09/20 0930 .  meropenem (MERREM) 1 g in sodium chloride 0.9 % 100 mL IVPB, 1 g, Intravenous, Q8H, Taariq Leitz, MD, Last Rate: 200 mL/hr at 05/09/20 0605, 1 g at 05/09/20 0605 .  multivitamin with minerals tablet 1 tablet, 1 tablet, Per Tube, Daily, AleOttie GlazierD, 1 tablet at 05/09/20 0928 .  norepinephrine (LEVOPHED) 16 mg in 250m53memix infusion, 0-40 mcg/min, Intravenous, Titrated, BlakAwilda Bill, Stopped at 05/06/20 [id number] glycol (MIRALAX / GLYCOLAX) packet 17 g, 17 g, Per Tube, Daily, BlakAwilda Bill, 17 g at 05/09/20 0928 .  propofol (DIPRIVAN) 1000 MG/100ML infusion, 5-80 mcg/kg/min, Intravenous, Titrated, Blakeney, Dana G, NP, Last Rate: 50.6 mL/hr at 05/09/20 1002, 60 mcg/kg/min at 05/09/20 1002 .  vasopressin  (PITRESSIN) 20 Units in sodium chloride 0.9 % 100 mL infusion-*FOR SHOCK*, 0-0.04 Units/min, Intravenous, Continuous, Blakeney, Dana G, NP .  vecuronium (NORCURON) injection 10 mg, 10 mg, Intravenous, Q2H PRN, BlakAwilda Bill, 10 mg at 05/09/20 0111 .  zinc sulfate capsule 220 mg, 220 mg, Per Tube, Daily, BlakAwilda Bill, 220 mg at 05/09/20 [id number]   Patient has no known allergies.    REVIEW OF SYSTEMS    10 point ROS unable to perform due to sedation on MV  PHYSICAL EXAMINATION   Vital Signs: Temp:  [97.8 F (36.6 C)-100.7 F (38.2 C)] 97.8 F (36.6 C) (09/29 0600) Pulse Rate:  [66-102] 102 (09/29 0600) Resp:  [15-35] 35 (09/29 0600) BP: (114-158)/(56-79) 146/77 (09/29 0600) SpO2:  [88 %-100 %] 93 % (09/29 0800) FiO2 (%):  [75 %-100 %] 80 % (09/29 0800) Weight:  [181.5 kg] 181.5 kg (09/29 0329)  GENERAL:NAD HEAD: Normocephalic, atraumatic.  EYES: Pupils equal, round, reactive to light.  No scleral icterus.  MOUTH: Moist mucosal membrane. NECK: Supple. No thyromegaly. No nodules. No JVD.  PULMONARY: rhonchi bilaterally  CARDIOVASCULAR: S1 and S2. Regular rate and rhythm. No murmurs, rubs, or gallops.  GASTROINTESTINAL: Soft, nontender, non-distended. No masses. Positive bowel sounds. No hepatosplenomegaly.  MUSCULOSKELETAL: No swelling, clubbing, or edema.  NEUROLOGIC: Mild distress due to acute illness SKIN:intact,warm,dry   PERTINENT DATA     Infusions: . famotidine (PEPCID) IV 20 mg (05/09/20 0916)  . fentaNYL infusion INTRAVENOUS 400 mcg/hr (05/09/20 0732)  . insulin 12 Units/hr (05/09/20 0925)  . meropenem (MERREM) IV 1 g (05/09/20 [id number] norepinephrine (LEVOPHED) Adult infusion Stopped (05/06/20 [id number] propofol (DIPRIVAN) infusion 60 mcg/kg/min (05/09/20 1002)  . vasopressin     Scheduled Medications: . vitamin C  500 mg Per Tube Daily  . aspirin  81  mg Per Tube Daily  . chlorhexidine gluconate (MEDLINE KIT)  15 mL Mouth Rinse  BID  . Chlorhexidine Gluconate Cloth  6 each Topical Daily  . dexamethasone (DECADRON) injection  6 mg Intravenous Q24H  . docusate  100 mg Per Tube BID  . enoxaparin (LOVENOX) injection  40 mg Subcutaneous BID  . feeding supplement (PROSource TF)  90 mL Per Tube QID  . feeding supplement (VITAL HIGH PROTEIN)  1,000 mL Per Tube Q24H  . fentaNYL (SUBLIMAZE) injection  50 mcg Intravenous Once  . furosemide  40 mg Intravenous BID  . mouth rinse  15 mL Mouth Rinse 10 times per day  . multivitamin with minerals  1 tablet Per Tube Daily  . polyethylene glycol  17 g Per Tube Daily  . zinc sulfate  220 mg Per Tube Daily   PRN Medications: acetaminophen, dextrose, fentaNYL, vecuronium Hemodynamic parameters: CVP:  [17 mmHg-72 mmHg] 17 mmHg Intake/Output: 09/28 0701 - 09/29 0700 In: 3366.2 [I.V.:2622.4; NG/GT:443.6; IV Piggyback:300.2] Out: 7341 [Urine:5100]  Ventilator  Settings: Vent Mode: PRVC FiO2 (%):  [75 %-100 %] 80 % Set Rate:  [35 bmp] 35 bmp Vt Set:  [440 mL] 440 mL PEEP:  [15 cmH20] 15 cmH20   Other Labs:     LAB RESULTS:  Basic Metabolic Panel: Recent Labs  Lab 05/05/20 0628 05/05/20 0628 05/06/20 0445 05/06/20 0445 05/07/20 0518 05/07/20 0518 05/08/20 0501 05/08/20 1559 05/08/20 2200 05/09/20 0430  NA 141  --  141  --  144  --  143  --   --  151*  K 4.2   < > 5.1   < > 4.6   < > 4.6   < > 4.2 4.7  CL 103  --  102  --  105  --  105  --   --  108  CO2 30  --  29  --  33*  --  31  --   --  37*  GLUCOSE 214*  --  361*  --  224*  --  437*  --   --  217*  BUN 20  --  28*  --  42*  --  50*  --   --  50*  CREATININE 0.80  --  1.19  --  1.83*  --  1.30*  --   --  0.97  CALCIUM 8.6*  --  8.1*  --  7.7*  --  7.7*  --   --  8.2*  MG 2.5*  --  2.8*  --  3.5*  --  3.5*  --   --   --   PHOS  --   --  3.7  --  2.6  --  4.7*  --   --  5.3*   < > = values in this interval not displayed.   Liver Function Tests: Recent Labs  Lab 05/03/20 1217 05/08/20 0501  05/09/20 0430  AST 52* 31 25  ALT 44 40 37  ALKPHOS 51 45 44  BILITOT 0.6 1.0 0.8  PROT 6.6 5.7* 5.6*  ALBUMIN 3.7 3.0* 3.1*   No results for input(s): LIPASE, AMYLASE in the last 168 hours. No results for input(s): AMMONIA in the last 168 hours. CBC: Recent Labs  Lab 05/04/20 0530 05/04/20 0530 05/05/20 0628 05/06/20 0445 05/07/20 0518 05/08/20 0501 05/09/20 0430  WBC 2.9*   < > 6.2 12.0* 6.8 6.9 9.0  NEUTROABS 2.3  --   --   --   --  5.0 7.1  HGB 13.9   < > 13.5 14.2 11.9* 11.4* 12.2*  HCT 42.9   < > 42.3 44.0 37.6* 37.6* 39.4  MCV 84.3   < > 86.0 86.8 88.1 91.0 87.8  PLT 142*   < > 168 239 198 189 186   < > = values in this interval not displayed.   Cardiac Enzymes: No results for input(s): CKTOTAL, CKMB, CKMBINDEX, TROPONINI in the last 168 hours. BNP: Invalid input(s): POCBNP CBG: Recent Labs  Lab 05/09/20 0454 05/09/20 0559 05/09/20 0647 05/09/20 0751 05/09/20 0924  GLUCAP 209* 172* 167* 152* 153*       IMAGING RESULTS:  Imaging: No results found. _0 @ No results found.     ASSESSMENT AND PLAN    -Multidisciplinary rounds held today  Acute hypoxic respiratory failure due to COVID-19 ARDS COVID-19 pneumonia Underlying morbid obesity Mechanical intubation  Full vent support for now-vent settings reviewed and established  Mechanical ventilation via ARDS protocol, target PRVC 6 cc/kg Goal plateau pressure less than 30, driving pressure less than 15 SBT once all parameters met  VAP bundle implemented  Maintain O2 sats 88% or higher Continue remdesivir Continue steroids switched to Decadron 10 mg twice daily>>6 daily -05/08/20 Discontinue baricitinib due to leukopenia and lack of efficacy Follow inflammatory markers Trend WBC and monitor fever curves  Follow cultures  Continue vancomycin and ceftriaxone started-09/25  -9/27- diurese and keep negative fluid balance.  Wean O2 currently at 65%.  Minimize fluid infusions. Patient febrile  will start Tylenol                         -net 1550 urine out overnight  05/09/20- patient had desaturation event. Vent changes discussed with RT. Diuresis upgraded to 80lasix bid. Rubinol IV tid for bradycardia and high secretions   ID -continue IV abx as prescibed -follow up cultures  GI/Nutrition GI PROPHYLAXIS as indicated DIET-->TF's as tolerated Constipation protocol as indicated  ENDO - ICU hypoglycemic\Hyperglycemia protocol -check FSBS per protocol   ELECTROLYTES -follow labs as needed -replace as needed -pharmacy consultation   DVT/GI PRX ordered -SCDs  TRANSFUSIONS AS NEEDED MONITOR FSBS ASSESS the need for LABS as needed   Critical care provider statement:    Critical care time (minutes):  33   Critical care time was exclusive of:  Separately billable procedures and treating other patients   Critical care was necessary to treat or prevent imminent or life-threatening deterioration of the following conditions:  ARDS due to [place] pneumonia   Critical care was time spent personally by me on the following activities:  Development of treatment plan with patient or surrogate, discussions with consultants, evaluation of patient's response to treatment, examination of patient, obtaining history from patient or surrogate, ordering and performing treatments and interventions, ordering and review of laboratory studies and re-evaluation of patient's condition.  I assumed direction of critical care for this patient from another provider in my specialty: no    This document was prepared using Dragon voice recognition software and may include unintentional dictation errors.    Ottie Glazier, M.D.  Division of [facility]

## 2020-05-09 NOTE — Consult Note (Signed)
PHARMACY CONSULT NOTE  Pharmacy Consult for Electrolyte Monitoring and Replacement   Recent Labs: Potassium (mmol/L)  Date Value  05/09/2020 4.6   Magnesium (mg/dL)  Date Value  19/41/7408 3.5 (H)   Calcium (mg/dL)  Date Value  14/48/1856 8.2 (L)   Albumin (g/dL)  Date Value  31/49/7026 3.1 (L)   Phosphorus (mg/dL)  Date Value  37/85/8850 5.3 (H)   Sodium (mmol/L)  Date Value  05/09/2020 151 (H)  Corrected Calcium: 8.2  Assessment: Pharmacy has been consulted for electrolyte replacement in this 34 YOM requiring intubation and admission to the CCU for COVID PNA. Treatment including IV decadron, leading to the need for high amounts of insulin and eventually an insulin drip per the diabetes coordinator.  9/28 Electrolytes WNL with mag and phos elevated, possibly due to bump in Scr yesterday which has improved from 1.83>>1.30 and good UOP. Pt is receiving tube feeds and no maintenance fluids at this time.   Goal of Therapy:  Electrolytes WNL  Plan:  Potassium relatively stable despite insulin drip and Lasix. Dose of Lasix increased today and started on free water flushes for hypernatremia. Will recheck potassium at 2200 and continue to follow for necessary replacement. Doesn't appear to need as frequent monitoring; will address as necessary.  Albina Billet, PharmD Clinical Pharmacist 05/09/2020 5:17 PM

## 2020-05-10 ENCOUNTER — Inpatient Hospital Stay: Payer: BC Managed Care – PPO

## 2020-05-10 ENCOUNTER — Inpatient Hospital Stay: Admit: 2020-05-10 | Payer: BC Managed Care – PPO

## 2020-05-10 ENCOUNTER — Encounter: Payer: Self-pay | Admitting: Hospitalist

## 2020-05-10 LAB — BLOOD GAS, ARTERIAL
Acid-Base Excess: 16 mmol/L — ABNORMAL HIGH (ref 0.0–2.0)
Acid-Base Excess: 13.6 mmol/L — ABNORMAL HIGH (ref 0.0–2.0)
Bicarbonate: 46.4 mmol/L — ABNORMAL HIGH (ref 20.0–28.0)
Bicarbonate: 44.2 mmol/L — ABNORMAL HIGH (ref 20.0–28.0)
FIO2: 100
FIO2: 100
MECHVT: 460 mL
MECHVT: 460 mL
O2 Saturation: 93.4 %
O2 Saturation: 99.9 %
PEEP: 15 cmH2O
PEEP: 20 cmH2O
Patient temperature: 37
Patient temperature: 37
RATE: 35 resp/min
RATE: 35 resp/min
pCO2 arterial: 88 mmHg (ref 32.0–48.0)
pCO2 arterial: 92 mmHg (ref 32.0–48.0)
pH, Arterial: 7.33 — ABNORMAL LOW (ref 7.350–7.450)
pH, Arterial: 7.29 — ABNORMAL LOW (ref 7.350–7.450)
pO2, Arterial: 73 mmHg — ABNORMAL LOW (ref 83.0–108.0)
pO2, Arterial: 316 mmHg — ABNORMAL HIGH (ref 83.0–108.0)

## 2020-05-10 LAB — CBC WITH DIFFERENTIAL/PLATELET
Abs Immature Granulocytes: 0.16 10*3/uL — ABNORMAL HIGH (ref 0.00–0.07)
Basophils Absolute: 0 10*3/uL (ref 0.0–0.1)
Basophils Relative: 0 %
Eosinophils Absolute: 0 10*3/uL (ref 0.0–0.5)
Eosinophils Relative: 0 %
HCT: 41.4 % (ref 39.0–52.0)
Hemoglobin: 12.5 g/dL — ABNORMAL LOW (ref 13.0–17.0)
Immature Granulocytes: 2 %
Lymphocytes Relative: 13 %
Lymphs Abs: 1.1 10*3/uL (ref 0.7–4.0)
MCH: 27.5 pg (ref 26.0–34.0)
MCHC: 30.2 g/dL (ref 30.0–36.0)
MCV: 91 fL (ref 80.0–100.0)
Monocytes Absolute: 0.6 10*3/uL (ref 0.1–1.0)
Monocytes Relative: 7 %
Neutro Abs: 7 10*3/uL (ref 1.7–7.7)
Neutrophils Relative %: 78 %
Platelets: 193 10*3/uL (ref 150–400)
RBC: 4.55 MIL/uL (ref 4.22–5.81)
RDW: 15.5 % (ref 11.5–15.5)
WBC: 9 10*3/uL (ref 4.0–10.5)
nRBC: 0.3 % — ABNORMAL HIGH (ref 0.0–0.2)

## 2020-05-10 LAB — COMPREHENSIVE METABOLIC PANEL
ALT: 52 U/L — ABNORMAL HIGH (ref 0–44)
AST: 43 U/L — ABNORMAL HIGH (ref 15–41)
Albumin: 3.1 g/dL — ABNORMAL LOW (ref 3.5–5.0)
Alkaline Phosphatase: 41 U/L (ref 38–126)
Anion gap: 7 (ref 5–15)
BUN: 52 mg/dL — ABNORMAL HIGH (ref 6–20)
CO2: 40 mmol/L — ABNORMAL HIGH (ref 22–32)
Calcium: 8 mg/dL — ABNORMAL LOW (ref 8.9–10.3)
Chloride: 105 mmol/L (ref 98–111)
Creatinine, Ser: 1.01 mg/dL (ref 0.61–1.24)
GFR calc Af Amer: >60 mL/min (ref >60)
GFR calc non Af Amer: >60 mL/min (ref >60)
Glucose, Bld: 181 mg/dL — ABNORMAL HIGH (ref 70–99)
Potassium: 4.2 mmol/L (ref 3.5–5.1)
Sodium: 152 mmol/L — ABNORMAL HIGH (ref 135–145)
Total Bilirubin: 1 mg/dL (ref 0.3–1.2)
Total Protein: 5.8 g/dL — ABNORMAL LOW (ref 6.5–8.1)

## 2020-05-10 LAB — GLUCOSE, CAPILLARY
Glucose-Capillary: 129 mg/dL — ABNORMAL HIGH (ref 70–99)
Glucose-Capillary: 155 mg/dL — ABNORMAL HIGH (ref 70–99)
Glucose-Capillary: 166 mg/dL — ABNORMAL HIGH (ref 70–99)
Glucose-Capillary: 139 mg/dL — ABNORMAL HIGH (ref 70–99)
Glucose-Capillary: 150 mg/dL — ABNORMAL HIGH (ref 70–99)
Glucose-Capillary: 145 mg/dL — ABNORMAL HIGH (ref 70–99)
Glucose-Capillary: 145 mg/dL — ABNORMAL HIGH (ref 70–99)
Glucose-Capillary: 165 mg/dL — ABNORMAL HIGH (ref 70–99)
Glucose-Capillary: 132 mg/dL — ABNORMAL HIGH (ref 70–99)
Glucose-Capillary: 154 mg/dL — ABNORMAL HIGH (ref 70–99)
Glucose-Capillary: 143 mg/dL — ABNORMAL HIGH (ref 70–99)
Glucose-Capillary: 148 mg/dL — ABNORMAL HIGH (ref 70–99)
Glucose-Capillary: 166 mg/dL — ABNORMAL HIGH (ref 70–99)
Glucose-Capillary: 170 mg/dL — ABNORMAL HIGH (ref 70–99)
Glucose-Capillary: 164 mg/dL — ABNORMAL HIGH (ref 70–99)

## 2020-05-10 LAB — TRIGLYCERIDES: Triglycerides: 586 mg/dL — ABNORMAL HIGH (ref <150)

## 2020-05-10 LAB — MAGNESIUM: Magnesium: 3.2 mg/dL — ABNORMAL HIGH (ref 1.7–2.4)

## 2020-05-10 LAB — PHOSPHORUS: Phosphorus: 5.7 mg/dL — ABNORMAL HIGH (ref 2.5–4.6)

## 2020-05-10 LAB — POTASSIUM
Potassium: 4.3 mmol/L (ref 3.5–5.1)
Potassium: 4.1 mmol/L (ref 3.5–5.1)

## 2020-05-10 LAB — MISC LABCORP TEST (SEND OUT): Labcorp test code: 6627

## 2020-05-10 IMAGING — DX DG CHEST 1V PORT
1 series · 2 of 2 positions shown · non-contrast
Comparison: [DATE]

CLINICAL DATA: Hypoxia, respiratory failure

EXAM:
PORTABLE CHEST 1 VIEW

[Series 1: chest ap · 0.14mm/px · 2 of 2 slices shown]
[im 1/2]
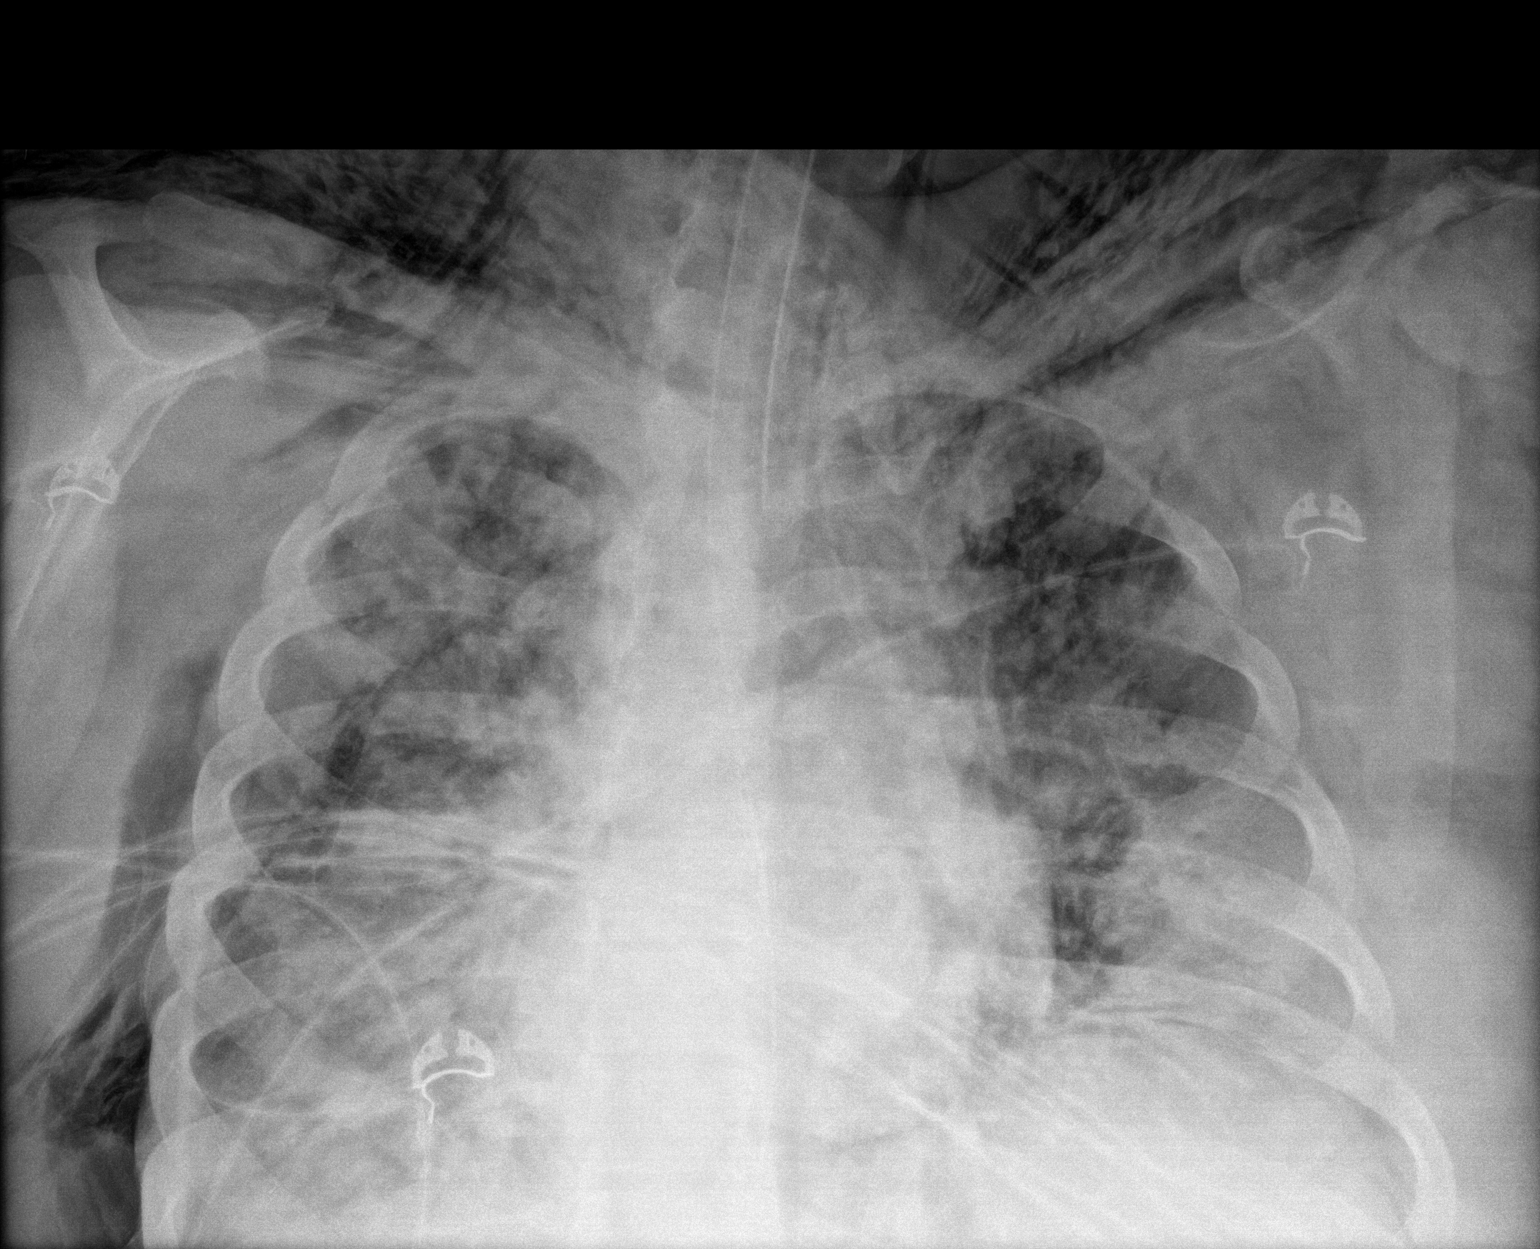
[im 2/2]
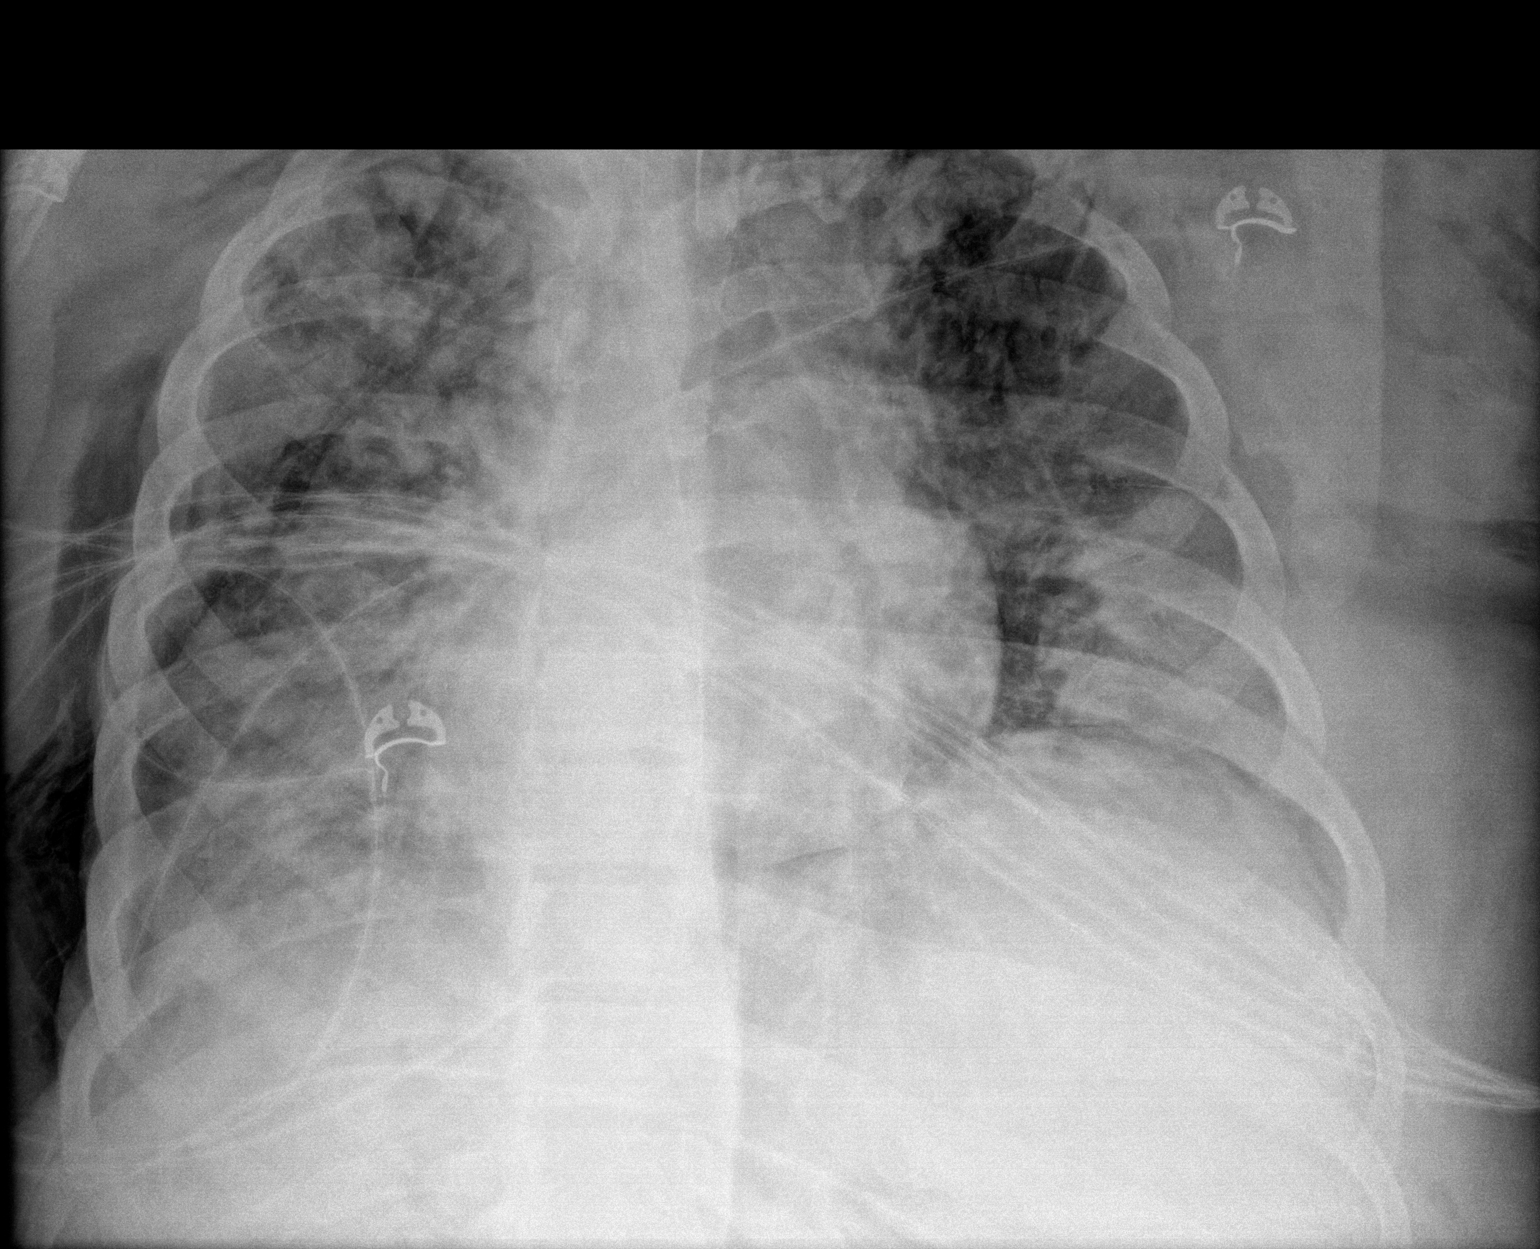

[2 of 2 positions shown; findings below may reference images not displayed]

FINDINGS: Two prone supine radiographs of the chest were obtained.
Endotracheal tube is present terminating approximately 4.0 cm above
the carina. Enteric tube courses below the diaphragm with distal tip
extending beyond the inferior margin of the film. Stable positioning
of left IJ central venous catheter. Extensive bilateral airspace
consolidations similar to the prior study. There is extensive chest
wall subcutaneous emphysema. Probable skin fold overlying the left
hemithorax. No discernible pneumothorax.
IMPRESSION: 1. Extensive chest wall subcutaneous emphysema and overlying skin
folds related to prone positioning limit the detection for
pneumothorax, which was question on the previous study. A follow-up
study after patient repositioning is suggested.
2. Extensive bilateral airspace consolidations, similar to the prior
study.
3. Support lines and tubes in satisfactory position.

## 2020-05-10 MED ORDER — IOHEXOL 350 MG/ML SOLN
100.0000 mL | Freq: Once | INTRAVENOUS | Status: AC | PRN
  Administered 2020-05-16: 75 mL via INTRAVENOUS

## 2020-05-10 MED ORDER — ACETAZOLAMIDE SODIUM 500 MG IJ SOLR
500.0000 mg | Freq: Once | INTRAMUSCULAR | Status: AC
  Administered 2020-05-10: 500 mg via INTRAVENOUS
  Filled 2020-05-10: qty 500

## 2020-05-10 MED ORDER — VECURONIUM BROMIDE 10 MG IV SOLR
10.0000 mg | Freq: Once | INTRAVENOUS | Status: AC
  Administered 2020-05-10: 10 mg via INTRAVENOUS

## 2020-05-10 MED ORDER — ATROPINE SULFATE 1 MG/10ML IJ SOSY
PREFILLED_SYRINGE | INTRAMUSCULAR | Status: AC
  Filled 2020-05-10: qty 10

## 2020-05-10 NOTE — Progress Notes (Signed)
Attempted to transport pt to CT for CTA Chest, however pt became hypoxic with O2 sats @85 -86% despite FiO2 @100 % and PEEP of 5.  Therefore, CTA Chest canceled due to High risk of respiratory arrest leading to cardiac arrest.  Pt transported back to ICU, and successfully proned with O2 sats increasing to 97%. Case discussed with ICU Intensivist Dr. .  Will continue to monitor and assess pt  , [facility]  Pulmonary/Critical Care Pager [phone] (please enter 7 digits) PCCM Consult Pager [phone] (please enter 7 digits)

## 2020-05-10 NOTE — Consult Note (Signed)
PHARMACY CONSULT NOTE  Pharmacy Consult for Electrolyte Monitoring and Replacement   Recent Labs: Potassium (mmol/L)  Date Value  05/10/2020 4.1   Magnesium (mg/dL)  Date Value  26/94/8546 3.2 (H)   Calcium (mg/dL)  Date Value  27/10/5007 8.0 (L)   Albumin (g/dL)  Date Value  38/18/2993 3.1 (L)   Phosphorus (mg/dL)  Date Value  71/69/6789 5.7 (H)   Sodium (mmol/L)  Date Value  05/10/2020 152 (H)  Corrected Calcium: 8.2  Assessment: Pharmacy has been consulted for electrolyte replacement in this 34 YOM requiring intubation and admission to the CCU for COVID PNA. Treatment including IV decadron, leading to the need for high amounts of insulin and eventually an insulin drip per the diabetes coordinator.  9/28 Electrolytes WNL with mag and phos elevated, possibly due to bump in Scr yesterday which has improved from 1.83>>1.30 and good UOP. Pt is receiving tube feeds and no maintenance fluids at this time.   9/29 Potassium relatively stable despite insulin drip (8.5 units/hr) and Lasix. Dose of Lasix increased today and started on free water flushes for hypernatremia. Will recheck electrolytes tomorrow AM. Doesn't appear to need as frequent monitoring; will address as necessary.  9/30 Na 152, K stable on insulin drip, Na still increased at 152. Free water flushes increased to 300 mL q4hours overnight. Pt also on lasix 80 mg BID. Scr 1.01 and producing good UOP.  Goal of Therapy:  Electrolytes WNL  Plan:  -Continue free water flushes for hypernatremia -Recheck electrolytes with AM labs Update @ 2128: K 4.1. No replacement at this time  Tressie Ellis PharmD Candidate 2022 05/10/2020 10:08 PM

## 2020-05-10 NOTE — Consult Note (Signed)
PHARMACY CONSULT NOTE  Pharmacy Consult for Electrolyte Monitoring and Replacement   Recent Labs: Potassium (mmol/L)  Date Value  05/10/2020 4.2   Magnesium (mg/dL)  Date Value  71/01/2693 3.2 (H)   Calcium (mg/dL)  Date Value  85/46/2703 8.0 (L)   Albumin (g/dL)  Date Value  50/04/3817 3.1 (L)   Phosphorus (mg/dL)  Date Value  29/93/7169 5.7 (H)   Sodium (mmol/L)  Date Value  05/10/2020 152 (H)  Corrected Calcium: 8.2  Assessment: Pharmacy has been consulted for electrolyte replacement in this 34 YOM requiring intubation and admission to the CCU for COVID PNA. Treatment including IV decadron, leading to the need for high amounts of insulin and eventually an insulin drip per the diabetes coordinator.  9/28 Electrolytes WNL with mag and phos elevated, possibly due to bump in Scr yesterday which has improved from 1.83>>1.30 and good UOP. Pt is receiving tube feeds and no maintenance fluids at this time.   9/29 Potassium relatively stable despite insulin drip (8.5 units/hr) and Lasix. Dose of Lasix increased today and started on free water flushes for hypernatremia. Will recheck electrolytes tomorrow AM. Doesn't appear to need as frequent monitoring; will address as necessary.  9/30 Na 152, K stable on insulin drip, Na still increased at 152. Free water flushes increased to 300 mL q4hours overnight. Pt also on lasix 80 mg BID. Scr 1.01 and producing good UOP.  Goal of Therapy:  Electrolytes WNL  Plan:  -Continue free water flushes for hypernatremia -Recheck electrolytes with AM labs.   Levada Schilling PharmD Candidate 2022 05/10/2020 10:50 AM

## 2020-05-10 NOTE — Progress Notes (Addendum)
CRITICAL CARE PROGRESS NOTE    Name: Collin Brown MRN: [id number] DOB: 19-Sep-1985     LOS: 7   SUBJECTIVE FINDINGS & SIGNIFICANT EVENTS    Patient description:  34 yo with OSA, morbid obesity BMI>56 came in with flu like illness diagnosed with COVID19 pneumonia was initially ion Medical floor with hospitalist service initiated Remdesevir and barictitinib, he progressively became more dyspneic he was placed on BIPAP and did not tolerate then placed on HFNC did not tolerate patient himself consented to MV and was intubated in MICU. Mother was notified who is sick at home with Collin Brown.   Events:  9/27- patient on 65%FiO2 with proning protocol. CVP trending, goal negative fluid balance.   05/08/20- patient with worsening ventilation increased FiO2 to 100% overnight and had episode of severe bradycardia.  Concern for mucus plugging will perform recruitment maneuvers and possible bronchoscopy.  We were able to perform tracheal aspirate and have weaned down fIO2 to 75% 05/09/20- patient had bradycardic episode and he desaturated overnight with temporary increase to 100FiO2.  Lasix q12 going. I spoke to brother today for update.  His tracheal aspirate came back normal flora so we will stop antibiotic.  05/10/20-patient with pneumomediastinum and on maximal setting on ventilator. Poor prognosis very unfortunate young man. I called and updated Mother today Collin Brown.      Lines/tubes  9/25 - Left IJ placed  : Airway 8 mm (Active)  Secured at (cm) 26 cm 05/07/20 0801  Measured From Lips 05/07/20 [place] 05/07/20 0801  Secured By [employer] 05/07/20 0801  Tube Holder Repositioned Yes 05/07/20 0801  Cuff Pressure (cm H2O) 26 cm H2O 05/07/20 0801  Site Condition Dry 05/07/20 0801      CVC Triple Lumen 05/05/20 Left Internal jugular (Active)  Indication for Insertion or Continuance of Line Vasoactive infusions;Poor Vasculature-patient has had multiple peripheral attempts or PIVs lasting less than 24 hours 05/06/20 2130  Site Assessment Clean;Dry;Intact 05/06/20 2130  Proximal Lumen Status Infusing 05/06/20 2130  Medial Lumen Status Infusing 05/06/20 2130  Distal Lumen Status Infusing 05/06/20 2130  Dressing Type Transparent;Occlusive 05/06/20 2130  Dressing Status Clean;Dry;Intact 05/06/20 2130  Antimicrobial disc in place? Yes 05/06/20 2130  Line Care Connections checked and tightened 05/06/20 2130  Dressing Intervention Dressing reinforced 05/06/20 0900  Dressing Change Due 05/12/20 05/06/20 2130     NG/OG Tube Orogastric Center mouth Xray 65 cm (Active)  Cm Marking at Nare/Corner of Mouth (if applicable) 65 cm 10/23/92 2130  Site Assessment Clean;Dry;Intact 05/06/20 2130  Ongoing Placement Verification No change in cm markings or external length of tube from initial placement;No change in respiratory status;No acute changes, not attributed to clinical condition;Xray 05/06/20 2130  Status Clamped 05/06/20 2130     Urethral Catheter Collin Garre, RN 16 Fr. (Active)  Indication for Insertion or Continuance of Catheter Unstable critically ill patients first 24-48 hours (See Criteria) 05/06/20 2000  Site Assessment Clean;Intact;Prolapse;Swelling 05/06/20 2000  Catheter Maintenance Bag below level of bladder;Catheter secured;Drainage bag/tubing not touching floor;Insertion date on drainage bag;No dependent loops;Seal intact 05/06/20 2000  Collection Container Standard drainage bag 05/06/20 2000  Securement Method Securing device (Describe) 05/06/20 2000  Urinary Catheter Interventions (if applicable) Unclamped 58/59/29 2000  Output (mL) 100 mL 05/07/20 0445    Microbiology/Sepsis markers: Results for orders placed or performed during the hospital encounter of 05/03/20  Blood  Culture (routine x 2)     Status: None   Collection Time: 05/03/20  4:33 PM   Specimen: BLOOD  Result Value Ref Range Status   Specimen Description BLOOD BLOOD LEFT HAND  Final   Special Requests   Final    BOTTLES DRAWN AEROBIC AND ANAEROBIC Blood Culture results may not be optimal due to an inadequate volume of blood received in culture bottles   Culture   Final    NO GROWTH 5 DAYS Performed at [facility], [address] [place], [place] [address]    Report Status 05/08/2020 FINAL  Final  Blood Culture (routine x 2)     Status: None   Collection Time: 05/03/20  4:33 PM   Specimen: BLOOD  Result Value Ref Range Status   Specimen Description BLOOD BLOOD RIGHT HAND  Final   Special Requests   Final    BOTTLES DRAWN AEROBIC AND ANAEROBIC Blood Culture adequate volume   Culture   Final    NO GROWTH 5 DAYS Performed at [facility], [address] [place], [place] [address]    Report Status 05/08/2020 FINAL  Final  Respiratory Panel by RT PCR (Flu A&B, Covid) -     Status: Abnormal   Collection Time: 05/03/20  7:32 PM  Result Value Ref Range Status   SARS Coronavirus 2 by RT PCR POSITIVE (A) NEGATIVE Final    Comment: RESULT CALLED TO, READ BACK BY AND VERIFIED WITH: Leonides Schanz RN 2114 05/03/20 HNM/AR    Influenza A by PCR NEGATIVE NEGATIVE Final   Influenza B by PCR NEGATIVE NEGATIVE Final    Comment: Performed at [facility], [address] [place], [place] [address]  MRSA PCR Screening     Status: None   Collection Time: 05/06/20 11:51 AM   Specimen: Nasal Mucosa; Nasopharyngeal  Result Value Ref Range Status   MRSA by PCR NEGATIVE NEGATIVE Final    Comment:        The GeneXpert MRSA Assay (FDA approved for NASAL specimens only), is one component of a comprehensive MRSA colonization surveillance program. It is not intended to diagnose MRSA infection nor to guide or monitor treatment for MRSA infections. Performed at [facility], [address] [place], [place] [address]   Culture, respiratory (non-expectorated)     Status: None   Collection Time: 05/07/20 10:55 AM   Specimen: Tracheal Aspirate; Respiratory  Result Value Ref Range Status   Specimen Description   Final    TRACHEAL ASPIRATE Performed at [facility], [address] [place], [place] [address]    Special Requests   Final    NONE Performed at [facility], [place], [place] [address]    Gram Stain   Final    RARE WBC PRESENT,BOTH PMN AND MONONUCLEAR NO ORGANISMS SEEN    Culture   Final    RARE Normal respiratory flora-no Staph aureus or Pseudomonas seen Performed at [facility] [address] [place], [place] [address]    Report Status 05/09/2020 FINAL  Final    Anti-infectives:  Anti-infectives (From admission, onward)   Start     Dose/Rate Route Frequency Ordered Stop   05/07/20 1200  meropenem (MERREM) 1 g in sodium chloride 0.9 % 100 mL IVPB  Status:  Discontinued        1 g 200 mL/hr over 30 Minutes Intravenous Every 8 hours 05/07/20 1020 05/09/20 1019   05/06/20 1200  vancomycin (VANCOCIN) IVPB 1000 mg/200 mL premix  Status:  Discontinued  1,000 mg 200 mL/hr over 60 Minutes Intravenous Every 8 hours 05/05/20 2330 05/07/20 1020   05/06/20 0200  vancomycin (VANCOREADY) IVPB 500 mg/100 mL       "Followed by" Linked Group Details   500 mg 100 mL/hr over 60 Minutes Intravenous  Once 05/05/20 2330 05/06/20 0454   05/06/20 0000  vancomycin (VANCOREADY) IVPB 2000 mg/400 mL       "Followed by" Linked Group Details   2,000 mg 200 mL/hr over 120 Minutes Intravenous  Once 05/05/20 2330 05/06/20 0304   05/05/20 2315  cefTRIAXone (ROCEPHIN) 2 g in sodium chloride 0.9 % 100 mL IVPB  Status:  Discontinued        2 g 200 mL/hr over 30 Minutes Intravenous Daily at 10 pm 05/05/20 2307 05/07/20 1020   05/04/20 1000  remdesivir 100 mg in sodium chloride 0.9 % 100 mL IVPB        "Followed by" Linked Group Details   100 mg 200 mL/hr over 30 Minutes Intravenous Daily 05/03/20 1718 05/07/20 0941   05/03/20 1800  remdesivir 200 mg in sodium chloride 0.9% 250 mL IVPB       "Followed by" Linked Group Details   200 mg 580 mL/hr over 30 Minutes Intravenous Once 05/03/20 1718 05/03/20 1941       Consults: Treatment Team:  Pccm, Ander Gaster, MD     PAST MEDICAL HISTORY   Past Medical History:  Diagnosis Date  . Hearing loss in right ear   . Sleep apnea      SURGICAL HISTORY   Past Surgical History:  Procedure Laterality Date  . TONSILLECTOMY       FAMILY HISTORY   Family History  Problem Relation Age of Onset  . Diabetes Mellitus II Mother   . Hypertension Mother   . Diabetes Mellitus II Father   . High blood pressure Father      SOCIAL HISTORY   Social History   Tobacco Use  . Smoking status: Never Smoker  . Smokeless tobacco: Never Used  Vaping Use  . Vaping Use: Never used  Substance Use Topics  . Alcohol use: No  . Drug use: No     MEDICATIONS   Current Medication:  Current Facility-Administered Medications:  .  acetaminophen (TYLENOL) tablet 1,000 mg, 1,000 mg, Oral, Q6H PRN, Sharion Settler, NP, 1,000 mg at 05/07/20 0907 .  ascorbic acid (VITAMIN C) tablet 500 mg, 500 mg, Per Tube, Daily, Awilda Bill, NP, 500 mg at 05/10/20 0842 .  aspirin chewable tablet 81 mg, 81 mg, Per Tube, Daily, Awilda Bill, NP, 81 mg at 05/10/20 0841 .  chlorhexidine gluconate (MEDLINE KIT) (PERIDEX) 0.12 % solution 15 mL, 15 mL, Mouth Rinse, BID, Tyler Pita, MD, 15 mL at 05/10/20 0843 .  Chlorhexidine Gluconate Cloth 2 % PADS 6 each, 6 each, Topical, Daily, Tyler Pita, MD, 6 each at 05/10/20 [phone]  dexamethasone (DECADRON) injection 6 mg, 6 mg, Intravenous, Q24H, Lanney Gins, Lilja Soland, MD, 6 mg at 05/10/20 0842 .  dextrose 50 % solution 0-50 mL, 0-50 mL, Intravenous, PRN, Lanney Gins, Kenniel Bergsma, MD .  docusate (COLACE) 50 MG/5ML  liquid 100 mg, 100 mg, Per Tube, BID, Awilda Bill, NP, 100 mg at 05/09/20 0928 .  enoxaparin (LOVENOX) injection 40 mg, 40 mg, Subcutaneous, BID, Para Skeans, MD, 40 mg at 05/10/20 0842 .  famotidine (PEPCID) IVPB 20 mg premix, 20 mg, Intravenous, Q12H, Tyler Pita, MD, Last Rate: 100 mL/hr at 05/10/20 0857, 20  mg at 05/10/20 0857 .  feeding supplement (PROSource TF) liquid 90 mL, 90 mL, Per Tube, QID, Zenovia Justman, MD, 90 mL at 05/10/20 1252 .  feeding supplement (VITAL HIGH PROTEIN) liquid 1,000 mL, 1,000 mL, Per Tube, Q24H, Anett Ranker, MD, 1,000 mL at 05/10/20 1252 .  fentaNYL (SUBLIMAZE) bolus via infusion 50 mcg, 50 mcg, Intravenous, Q15 min PRN, Awilda Bill, NP .  fentaNYL (SUBLIMAZE) injection 50 mcg, 50 mcg, Intravenous, Once, Blakeney, Dreama Saa, NP .  fentaNYL 2546mg in NS 2586m(1026mml) infusion-PREMIX, 0-400 mcg/hr, Intravenous, Continuous, ChaBenita GutterPH, Last Rate: 40 mL/hr at 05/10/20 1518, 400 mcg/hr at 05/10/20 1518 .  free water 300 mL, 300 mL, Per Tube, Q4H, BlaAwilda BillP, 300 mL at 05/10/20 1252 .  furosemide (LASIX) injection 80 mg, 80 mg, Intravenous, BID, AleLanney Ginsuad, MD, 80 mg at 05/10/20 0842 .  insulin regular, human (MYXREDLIN) 100 units/ 100 mL infusion, , Intravenous, Continuous, Elizabeth Paulsen, MD, Last Rate: 10 mL/hr at 05/10/20 1409, 10 Units/hr at 05/10/20 1409 .  iohexol (OMNIPAQUE) 350 MG/ML injection 100 mL, 100 mL, Intravenous, Once PRN, BlaAwilda BillP .  MEDLINE mouth rinse, 15 mL, Mouth Rinse, 10 times per day, GonTyler PitaD, 15 mL at 05/10/20 1435 .  midazolam (VERSED) 50 mg in dextrose 5 % 50 mL (1 mg/mL) infusion, 0.5-10 mg/hr, Intravenous, Continuous, ChaBenita GutterPH, Last Rate: 10 mL/hr at 05/10/20 1256, 10 mg/hr at 05/10/20 1256 .  multivitamin with minerals tablet 1 tablet, 1 tablet, Per Tube, Daily, AleOttie GlazierD, 1 tablet at 05/10/20 0842 .  norepinephrine (LEVOPHED) 16 mg in 250m5mremix infusion, 0-40 mcg/min, Intravenous, Titrated, BlakAwilda Bill, Last Rate: 0.94 mL/hr at 05/10/20 0318, 1 mcg/min at 05/10/20 0318 .  polyethylene glycol (MIRALAX / GLYCOLAX) packet 17 g, 17 g, Per Tube, Daily, BlakAwilda Bill, 17 g at 05/09/20 0928 .  propofol (DIPRIVAN) 1000 MG/100ML infusion, 5-80 mcg/kg/min, Intravenous, Titrated, Blakeney, DanaDreama Saa, Last Rate: 8.44 mL/hr at 05/10/20 1437, 10 mcg/kg/min at 05/10/20 1437 .  vasopressin (PITRESSIN) 20 Units in sodium chloride 0.9 % 100 mL infusion-*FOR SHOCK*, 0-0.04 Units/min, Intravenous, Continuous, Blakeney, Dana G, NP .  vecuronium (NORCURON) injection 10 mg, 10 mg, Intravenous, Q1H PRN, BlakAwilda Bill, 10 mg at 05/10/20 0132 .  zinc sulfate capsule 220 mg, 220 mg, Per Tube, Daily, BlakAwilda Bill, 220 mg at 05/10/20 [phone]   Patient has no known allergies.    REVIEW OF SYSTEMS    10 point ROS unable to perform due to sedation on MV  PHYSICAL EXAMINATION   Vital Signs: Temp:  [97.9 F (36.6 C)-101.8 F (38.8 C)] 99.5 F (37.5 C) (09/30 1200) Pulse Rate:  [96-133] 102 (09/30 0800) Resp:  [35] 35 (09/30 0800) BP: (85-173)/(53-87) 106/54 (09/30 1200) SpO2:  [90 %-99 %] 97 % (09/30 0600) Arterial Line BP: (106-156)/(48-57) 147/50 (09/30 0600) FiO2 (%):  [45 %-100 %] 100 % (09/30 1338) Weight:  [183[970 183 kg (09/30 0433)  GENERAL:NAD HEAD: Normocephalic, atraumatic.  EYES: Pupils equal, round, reactive to light.  No scleral icterus.  MOUTH: Moist mucosal membrane. NECK: Supple. No thyromegaly. No nodules. No JVD.  PULMONARY: rhonchi bilaterally  CARDIOVASCULAR: S1 and S2. Regular rate and rhythm. No murmurs, rubs, or gallops.  GASTROINTESTINAL: Soft, nontender, non-distended. No masses. Positive bowel sounds. No hepatosplenomegaly.  MUSCULOSKELETAL: No swelling, clubbing, or edema.  NEUROLOGIC: Mild distress due  to acute illness SKIN:intact,warm,dry   PERTINENT DATA      Infusions: . famotidine (PEPCID) IV 20 mg (05/10/20 0857)  . fentaNYL infusion INTRAVENOUS 400 mcg/hr (05/10/20 1518)  . insulin 10 Units/hr (05/10/20 1409)  . midazolam 10 mg/hr (05/10/20 1256)  . norepinephrine (LEVOPHED) Adult infusion 1 mcg/min (05/10/20 0318)  . propofol (DIPRIVAN) infusion 10 mcg/kg/min (05/10/20 1437)  . vasopressin     Scheduled Medications: . vitamin C  500 mg Per Tube Daily  . aspirin  81 mg Per Tube Daily  . chlorhexidine gluconate (MEDLINE KIT)  15 mL Mouth Rinse BID  . Chlorhexidine Gluconate Cloth  6 each Topical Daily  . dexamethasone (DECADRON) injection  6 mg Intravenous Q24H  . docusate  100 mg Per Tube BID  . enoxaparin (LOVENOX) injection  40 mg Subcutaneous BID  . feeding supplement (PROSource TF)  90 mL Per Tube QID  . feeding supplement (VITAL HIGH PROTEIN)  1,000 mL Per Tube Q24H  . fentaNYL (SUBLIMAZE) injection  50 mcg Intravenous Once  . free water  300 mL Per Tube Q4H  . furosemide  80 mg Intravenous BID  . mouth rinse  15 mL Mouth Rinse 10 times per day  . multivitamin with minerals  1 tablet Per Tube Daily  . polyethylene glycol  17 g Per Tube Daily  . zinc sulfate  220 mg Per Tube Daily   PRN Medications: acetaminophen, dextrose, fentaNYL, iohexol, vecuronium Hemodynamic parameters: CVP:  [17 mmHg-18 mmHg] 18 mmHg Intake/Output: 09/29 0701 - 09/30 0700 In: 4474.5 [I.V.:2318.2; NG/GT:2056.4; IV Piggyback:100] Out: 6000 [Urine:6000]  Ventilator  Settings: Vent Mode: PRVC FiO2 (%):  [45 %-100 %] 100 % Set Rate:  [35 bmp] 35 bmp Vt Set:  [460 mL] 460 mL PEEP:  [10 GBT51-76 cmH20] 15 cmH20 Plateau Pressure:  [30 cmH20] 30 cmH20   Other Labs:     LAB RESULTS:  Basic Metabolic Panel: Recent Labs  Lab 05/05/20 0628 05/05/20 0628 05/06/20 0445 05/06/20 0445 05/07/20 0518 05/07/20 0518 05/08/20 0501 05/08/20 1559 05/09/20 0430 05/09/20 1550 05/09/20 2150 05/10/20 0402  NA 141   < > 141  --  144  --  143  --   151*  --   --  152*  K 4.2   < > 5.1   < > 4.6   < > 4.6   < > 4.7   < > 4.2 4.2  CL 103   < > 102  --  105  --  105  --  108  --   --  105  CO2 30   < > 29  --  33*  --  31  --  37*  --   --  40*  GLUCOSE 214*   < > 361*  --  224*  --  437*  --  217*  --   --  181*  BUN 20   < > 28*  --  42*  --  50*  --  50*  --   --  52*  CREATININE 0.80   < > 1.19  --  1.83*  --  1.30*  --  0.97  --   --  1.01  CALCIUM 8.6*   < > 8.1*  --  7.7*  --  7.7*  --  8.2*  --   --  8.0*  MG 2.5*  --  2.8*  --  3.5*  --  3.5*  --   --   --   --  3.2*  PHOS  --   --  3.7  --  2.6  --  4.7*  --  5.3*  --   --  5.7*   < > = values in this interval not displayed.   Liver Function Tests: Recent Labs  Lab 05/08/20 0501 05/09/20 0430 05/10/20 0402  AST 31 25 43*  ALT 40 37 52*  ALKPHOS 45 44 41  BILITOT 1.0 0.8 1.0  PROT 5.7* 5.6* 5.8*  ALBUMIN 3.0* 3.1* 3.1*   No results for input(s): LIPASE, AMYLASE in the last 168 hours. No results for input(s): AMMONIA in the last 168 hours. CBC: Recent Labs  Lab 05/04/20 0530 05/05/20 0628 05/06/20 0445 05/07/20 0518 05/08/20 0501 05/09/20 0430 05/10/20 0402  WBC 2.9*   < > 12.0* 6.8 6.9 9.0 9.0  NEUTROABS 2.3  --   --   --  5.0 7.1 7.0  HGB 13.9   < > 14.2 11.9* 11.4* 12.2* 12.5*  HCT 42.9   < > 44.0 37.6* 37.6* 39.4 41.4  MCV 84.3   < > 86.8 88.1 91.0 87.8 91.0  PLT 142*   < > 239 198 189 186 193   < > = values in this interval not displayed.   Cardiac Enzymes: No results for input(s): CKTOTAL, CKMB, CKMBINDEX, TROPONINI in the last 168 hours. BNP: Invalid input(s): POCBNP CBG: Recent Labs  Lab 05/10/20 0945 05/10/20 1054 05/10/20 1248 05/10/20 1407 05/10/20 1514  GLUCAP 145* 165* 132* 154* 143*       IMAGING RESULTS:  Imaging: DG Chest Port 1 View  Result Date: 05/10/2020 CLINICAL DATA:  Hypoxia, respiratory failure EXAM: PORTABLE CHEST 1 VIEW COMPARISON:  05/09/2020 FINDINGS: Two prone supine radiographs of the chest were obtained.  Endotracheal tube is present terminating approximately 4.0 cm above the carina. Enteric tube courses below the diaphragm with distal tip extending beyond the inferior margin of the film. Stable positioning of left IJ central venous catheter. Extensive bilateral airspace consolidations similar to the prior study. There is extensive chest wall subcutaneous emphysema. Probable skin fold overlying the left hemithorax. No discernible pneumothorax. IMPRESSION: 1. Extensive chest wall subcutaneous emphysema and overlying skin folds related to prone positioning limit the detection for pneumothorax, which was question on the previous study. A follow-up study after patient repositioning is suggested. 2. Extensive bilateral airspace consolidations, similar to the prior study. 3. Support lines and tubes in satisfactory position. Electronically Signed   By: Davina Poke D.O.   On: 05/10/2020 08:38   DG Chest Port 1 View  Result Date: 05/09/2020 CLINICAL DATA:  Respiratory failure EXAM: PORTABLE CHEST 1 VIEW COMPARISON:  05/07/2020, 05/05/2020, 05/03/2020 FINDINGS: Endotracheal tube tip is about 3.6 cm superior to the carina. Left IJ central venous catheter similar in position. Esophageal tube tip is below the diaphragm but incompletely visualized. Extensive bilateral pulmonary airspace disease with worsening compared to prior radiograph. Interim development of extensive subcutaneous emphysema at the chest and upper abdomen. Possible pneumomediastinum. Possible trace right apical pneumothorax. IMPRESSION: 1. Endotracheal tube tip about 3.6 cm superior to the carina. 2. Interim development of extensive subcutaneous emphysema throughout the chest and upper abdomen. Probable pneumomediastinum. Possible trace right apical pneumothorax. Attention on follow-up radiography. 3. Worsening of extensive bilateral pulmonary airspace disease. Electronically Signed   By: Donavan Foil M.D.   On: 05/09/2020 22:06   _0 @ DG  Chest Port 1 View  Result Date: 05/10/2020 CLINICAL DATA:  Hypoxia, respiratory failure EXAM: PORTABLE CHEST 1 VIEW COMPARISON:  05/09/2020  FINDINGS: Two prone supine radiographs of the chest were obtained. Endotracheal tube is present terminating approximately 4.0 cm above the carina. Enteric tube courses below the diaphragm with distal tip extending beyond the inferior margin of the film. Stable positioning of left IJ central venous catheter. Extensive bilateral airspace consolidations similar to the prior study. There is extensive chest wall subcutaneous emphysema. Probable skin fold overlying the left hemithorax. No discernible pneumothorax. IMPRESSION: 1. Extensive chest wall subcutaneous emphysema and overlying skin folds related to prone positioning limit the detection for pneumothorax, which was question on the previous study. A follow-up study after patient repositioning is suggested. 2. Extensive bilateral airspace consolidations, similar to the prior study. 3. Support lines and tubes in satisfactory position. Electronically Signed   By: Davina Poke D.O.   On: 05/10/2020 08:38   DG Chest Port 1 View  Result Date: 05/09/2020 CLINICAL DATA:  Respiratory failure EXAM: PORTABLE CHEST 1 VIEW COMPARISON:  05/07/2020, 05/05/2020, 05/03/2020 FINDINGS: Endotracheal tube tip is about 3.6 cm superior to the carina. Left IJ central venous catheter similar in position. Esophageal tube tip is below the diaphragm but incompletely visualized. Extensive bilateral pulmonary airspace disease with worsening compared to prior radiograph. Interim development of extensive subcutaneous emphysema at the chest and upper abdomen. Possible pneumomediastinum. Possible trace right apical pneumothorax. IMPRESSION: 1. Endotracheal tube tip about 3.6 cm superior to the carina. 2. Interim development of extensive subcutaneous emphysema throughout the chest and upper abdomen. Probable pneumomediastinum. Possible trace right  apical pneumothorax. Attention on follow-up radiography. 3. Worsening of extensive bilateral pulmonary airspace disease. Electronically Signed   By: Donavan Foil M.D.   On: 05/09/2020 22:06       ASSESSMENT AND PLAN    -Multidisciplinary rounds held today  Acute hypoxic respiratory failure due to COVID-19 ARDS COVID-19 pneumonia Underlying morbid obesity Mechanical intubation  Full vent support for now-vent settings reviewed and established  Mechanical ventilation via ARDS protocol, target PRVC 6 cc/kg Goal plateau pressure less than 30, driving pressure less than 15 SBT once all parameters met  VAP bundle implemented  Maintain O2 sats 88% or higher Continue remdesivir Continue steroids switched to Decadron 10 mg twice daily>>6 daily -05/08/20 Discontinue baricitinib due to leukopenia and lack of efficacy Follow inflammatory markers Trend WBC and monitor fever curves  Follow cultures  Continue vancomycin and ceftriaxone started-09/25  -9/27- diurese and keep negative fluid balance.  Wean O2 currently at 65%.  Minimize fluid infusions. Patient febrile will start Tylenol                         -net 1550 urine out overnight  05/09/20- patient had desaturation event. Vent changes discussed with RT. Diuresis upgraded to 80lasix bid. Rubinol IV tid for bradycardia and high secretions 05/10/20- patient with extensive subcutaneous emphysema  ID -continue IV abx as prescibed -follow up cultures  GI/Nutrition GI PROPHYLAXIS as indicated DIET-->TF's as tolerated Constipation protocol as indicated  ENDO - ICU hypoglycemic\Hyperglycemia protocol -check FSBS per protocol   ELECTROLYTES -follow labs as needed -replace as needed -pharmacy consultation   DVT/GI PRX ordered -SCDs  TRANSFUSIONS AS NEEDED MONITOR FSBS ASSESS the need for LABS as needed   Critical care provider statement:    Critical care time (minutes):  33   Critical care time was exclusive of:  Separately  billable procedures and treating other patients   Critical care was necessary to treat or prevent imminent or life-threatening deterioration of the following conditions:  ARDS due to [place] pneumonia   Critical care was time spent personally by me on the following activities:  Development of treatment plan with patient or surrogate, discussions with consultants, evaluation of patient's response to treatment, examination of patient, obtaining history from patient or surrogate, ordering and performing treatments and interventions, ordering and review of laboratory studies and re-evaluation of patient's condition.  I assumed direction of critical care for this patient from another provider in my specialty: no    This document was prepared using Dragon voice recognition software and may include unintentional dictation errors.    Ottie Glazier, M.D.  Division of [facility]

## 2020-05-10 NOTE — Procedures (Signed)
Arterial Catheter Insertion Procedure Note  Collin Brown  [id number]  10-22-1985  Date:05/10/20  Time:1:58 AM    Provider Performing: Salome Holmes    Procedure: Insertion of Arterial Line (42706) with US guidance (23762)   Indication(s) Blood pressure monitoring and/or need for frequent ABGs  Consent Risks of the procedure as well as the alternatives and risks of each were explained to the patient and/or caregiver.  Consent for the procedure was obtained and is signed in the bedside chart  Anesthesia None   Time Out Verified patient identification, verified procedure, site/side was marked, verified correct patient position, special equipment/implants available, medications/allergies/relevant history reviewed, required imaging and test results available.   Sterile Technique Maximal sterile technique including full sterile barrier drape, hand hygiene, sterile gown, sterile gloves, mask, hair covering, sterile ultrasound probe cover (if used).   Procedure Description Area of catheter insertion was cleaned with chlorhexidine and draped in sterile fashion. With real-time ultrasound guidance an arterial catheter was placed into the right radial artery.  Appropriate arterial tracings confirmed on monitor.     Complications/Tolerance None; patient tolerated the procedure well.   EBL Minimal   Specimen(s) None

## 2020-05-10 NOTE — Consult Note (Signed)
PHARMACY CONSULT NOTE  Pharmacy Consult for Electrolyte Monitoring and Replacement   Recent Labs: Potassium (mmol/L)  Date Value  05/10/2020 4.3   Magnesium (mg/dL)  Date Value  83/38/2505 3.2 (H)   Calcium (mg/dL)  Date Value  39/76/7341 8.0 (L)   Albumin (g/dL)  Date Value  93/79/0240 3.1 (L)   Phosphorus (mg/dL)  Date Value  97/35/3299 5.7 (H)   Sodium (mmol/L)  Date Value  05/10/2020 152 (H)  Corrected Calcium: 8.2  Assessment: Pharmacy has been consulted for electrolyte replacement in this 34 YOM requiring intubation and admission to the CCU for COVID PNA. Treatment including IV decadron, leading to the need for high amounts of insulin and eventually an insulin drip per the diabetes coordinator.  9/28 Electrolytes WNL with mag and phos elevated, possibly due to bump in Scr yesterday which has improved from 1.83>>1.30 and good UOP. Pt is receiving tube feeds and no maintenance fluids at this time.   9/29 Potassium relatively stable despite insulin drip (8.5 units/hr) and Lasix. Dose of Lasix increased today and started on free water flushes for hypernatremia. Will recheck electrolytes tomorrow AM. Doesn't appear to need as frequent monitoring; will address as necessary.  9/30 Na 152, K stable on insulin drip, Na still increased at 152. Free water flushes increased to 300 mL q4hours overnight. Pt also on lasix 80 mg BID. Scr 1.01 and producing good UOP.  Goal of Therapy:  Electrolytes WNL  Plan:  -Continue free water flushes for hypernatremia -Recheck electrolytes with AM labs.  Update @ 1827: K 4.3. No replacement at this time. Will recheck potassium at 2100.  Katha Cabal PharmD Candidate 2022 05/10/2020 6:27 PM

## 2020-05-11 ENCOUNTER — Inpatient Hospital Stay: Admit: 2020-05-11 | Payer: BC Managed Care – PPO

## 2020-05-11 ENCOUNTER — Inpatient Hospital Stay: Payer: BC Managed Care – PPO

## 2020-05-11 ENCOUNTER — Inpatient Hospital Stay (HOSPITAL_COMMUNITY)
Admit: 2020-05-11 | Discharge: 2020-05-11 | Disposition: A | Discharge status: Home / Self Care | Payer: BC Managed Care – PPO | Attending: Critical Care Medicine | Admitting: Critical Care Medicine

## 2020-05-11 DIAGNOSIS — R9431 Abnormal electrocardiogram [ECG] [EKG]: Secondary | ICD-10-CM

## 2020-05-11 LAB — BLOOD GAS, ARTERIAL
Acid-Base Excess: 20.6 mmol/L — ABNORMAL HIGH (ref 0.0–2.0)
Bicarbonate: 49.4 mmol/L — ABNORMAL HIGH (ref 20.0–28.0)
FIO2: 1
MECHVT: 460 mL
Mechanical Rate: 35
O2 Saturation: 99.7 %
PEEP: 15 cmH2O
Patient temperature: 38.2
RATE: 35 resp/min
pCO2 arterial: 82 mmHg (ref 32.0–48.0)
pH, Arterial: 7.39 (ref 7.350–7.450)
pO2, Arterial: 209 mmHg — ABNORMAL HIGH (ref 83.0–108.0)

## 2020-05-11 LAB — COMPREHENSIVE METABOLIC PANEL
ALT: 54 U/L — ABNORMAL HIGH (ref 0–44)
AST: 49 U/L — ABNORMAL HIGH (ref 15–41)
Albumin: 2.9 g/dL — ABNORMAL LOW (ref 3.5–5.0)
Alkaline Phosphatase: 38 U/L (ref 38–126)
Anion gap: 15 (ref 5–15)
BUN: 44 mg/dL — ABNORMAL HIGH (ref 6–20)
CO2: 37 mmol/L — ABNORMAL HIGH (ref 22–32)
Calcium: 8.2 mg/dL — ABNORMAL LOW (ref 8.9–10.3)
Chloride: 101 mmol/L (ref 98–111)
Creatinine, Ser: 0.84 mg/dL (ref 0.61–1.24)
GFR calc Af Amer: >60 mL/min (ref >60)
GFR calc non Af Amer: >60 mL/min (ref >60)
Glucose, Bld: 158 mg/dL — ABNORMAL HIGH (ref 70–99)
Potassium: 4.3 mmol/L (ref 3.5–5.1)
Sodium: 153 mmol/L — ABNORMAL HIGH (ref 135–145)
Total Bilirubin: 1.2 mg/dL (ref 0.3–1.2)
Total Protein: 5.9 g/dL — ABNORMAL LOW (ref 6.5–8.1)

## 2020-05-11 LAB — CBC WITH DIFFERENTIAL/PLATELET
Abs Immature Granulocytes: 0.11 10*3/uL — ABNORMAL HIGH (ref 0.00–0.07)
Basophils Absolute: 0 10*3/uL (ref 0.0–0.1)
Basophils Relative: 0 %
Eosinophils Absolute: 0.2 10*3/uL (ref 0.0–0.5)
Eosinophils Relative: 3 %
HCT: 39.3 % (ref 39.0–52.0)
Hemoglobin: 11.4 g/dL — ABNORMAL LOW (ref 13.0–17.0)
Immature Granulocytes: 2 %
Lymphocytes Relative: 8 %
Lymphs Abs: 0.6 10*3/uL — ABNORMAL LOW (ref 0.7–4.0)
MCH: 27 pg (ref 26.0–34.0)
MCHC: 29 g/dL — ABNORMAL LOW (ref 30.0–36.0)
MCV: 93.1 fL (ref 80.0–100.0)
Monocytes Absolute: 0.4 10*3/uL (ref 0.1–1.0)
Monocytes Relative: 5 %
Neutro Abs: 5.9 10*3/uL (ref 1.7–7.7)
Neutrophils Relative %: 82 %
Platelets: 158 10*3/uL (ref 150–400)
RBC: 4.22 MIL/uL (ref 4.22–5.81)
RDW: 15.3 % (ref 11.5–15.5)
WBC: 7.1 10*3/uL (ref 4.0–10.5)
nRBC: 0 % (ref 0.0–0.2)

## 2020-05-11 LAB — ECHOCARDIOGRAM COMPLETE
AR max vel: 2.31 cm2
AV Peak grad: 15.5 mmHg
Ao pk vel: 1.97 m/s
Area-P 1/2: 5.42 cm2
Height: 70 in
S' Lateral: 3.29 cm
Weight: 6451.54 oz

## 2020-05-11 LAB — GLUCOSE, CAPILLARY
Glucose-Capillary: 140 mg/dL — ABNORMAL HIGH (ref 70–99)
Glucose-Capillary: 144 mg/dL — ABNORMAL HIGH (ref 70–99)
Glucose-Capillary: 147 mg/dL — ABNORMAL HIGH (ref 70–99)
Glucose-Capillary: 149 mg/dL — ABNORMAL HIGH (ref 70–99)
Glucose-Capillary: 153 mg/dL — ABNORMAL HIGH (ref 70–99)
Glucose-Capillary: 153 mg/dL — ABNORMAL HIGH (ref 70–99)
Glucose-Capillary: 155 mg/dL — ABNORMAL HIGH (ref 70–99)
Glucose-Capillary: 155 mg/dL — ABNORMAL HIGH (ref 70–99)
Glucose-Capillary: 167 mg/dL — ABNORMAL HIGH (ref 70–99)
Glucose-Capillary: 173 mg/dL — ABNORMAL HIGH (ref 70–99)
Glucose-Capillary: 173 mg/dL — ABNORMAL HIGH (ref 70–99)
Glucose-Capillary: 178 mg/dL — ABNORMAL HIGH (ref 70–99)
Glucose-Capillary: 194 mg/dL — ABNORMAL HIGH (ref 70–99)
Glucose-Capillary: 199 mg/dL — ABNORMAL HIGH (ref 70–99)
Glucose-Capillary: 201 mg/dL — ABNORMAL HIGH (ref 70–99)
Glucose-Capillary: 201 mg/dL — ABNORMAL HIGH (ref 70–99)
Glucose-Capillary: 208 mg/dL — ABNORMAL HIGH (ref 70–99)

## 2020-05-11 LAB — FIBRIN DERIVATIVES D-DIMER (ARMC ONLY): Fibrin derivatives D-dimer (ARMC): 1780.71 ng/mL (FEU) — ABNORMAL HIGH (ref 0.00–499.00)

## 2020-05-11 LAB — C-REACTIVE PROTEIN: CRP: 14.5 mg/dL — ABNORMAL HIGH (ref <1.0)

## 2020-05-11 LAB — PHOSPHORUS: Phosphorus: 3.4 mg/dL (ref 2.5–4.6)

## 2020-05-11 LAB — TRIGLYCERIDES: Triglycerides: 276 mg/dL — ABNORMAL HIGH (ref <150)

## 2020-05-11 LAB — FERRITIN: Ferritin: 1104 ng/mL — ABNORMAL HIGH (ref 24–336)

## 2020-05-11 IMAGING — DX DG CHEST 1V PORT
1 series · 2 of 2 positions shown · non-contrast
Comparison: [DATE]

CLINICAL DATA: Acute respiratory failure with hypoxia

EXAM:
PORTABLE CHEST 1 VIEW

[Series 1: chest ap · 0.14mm/px · 2 of 2 slices shown]
[im 1/2]
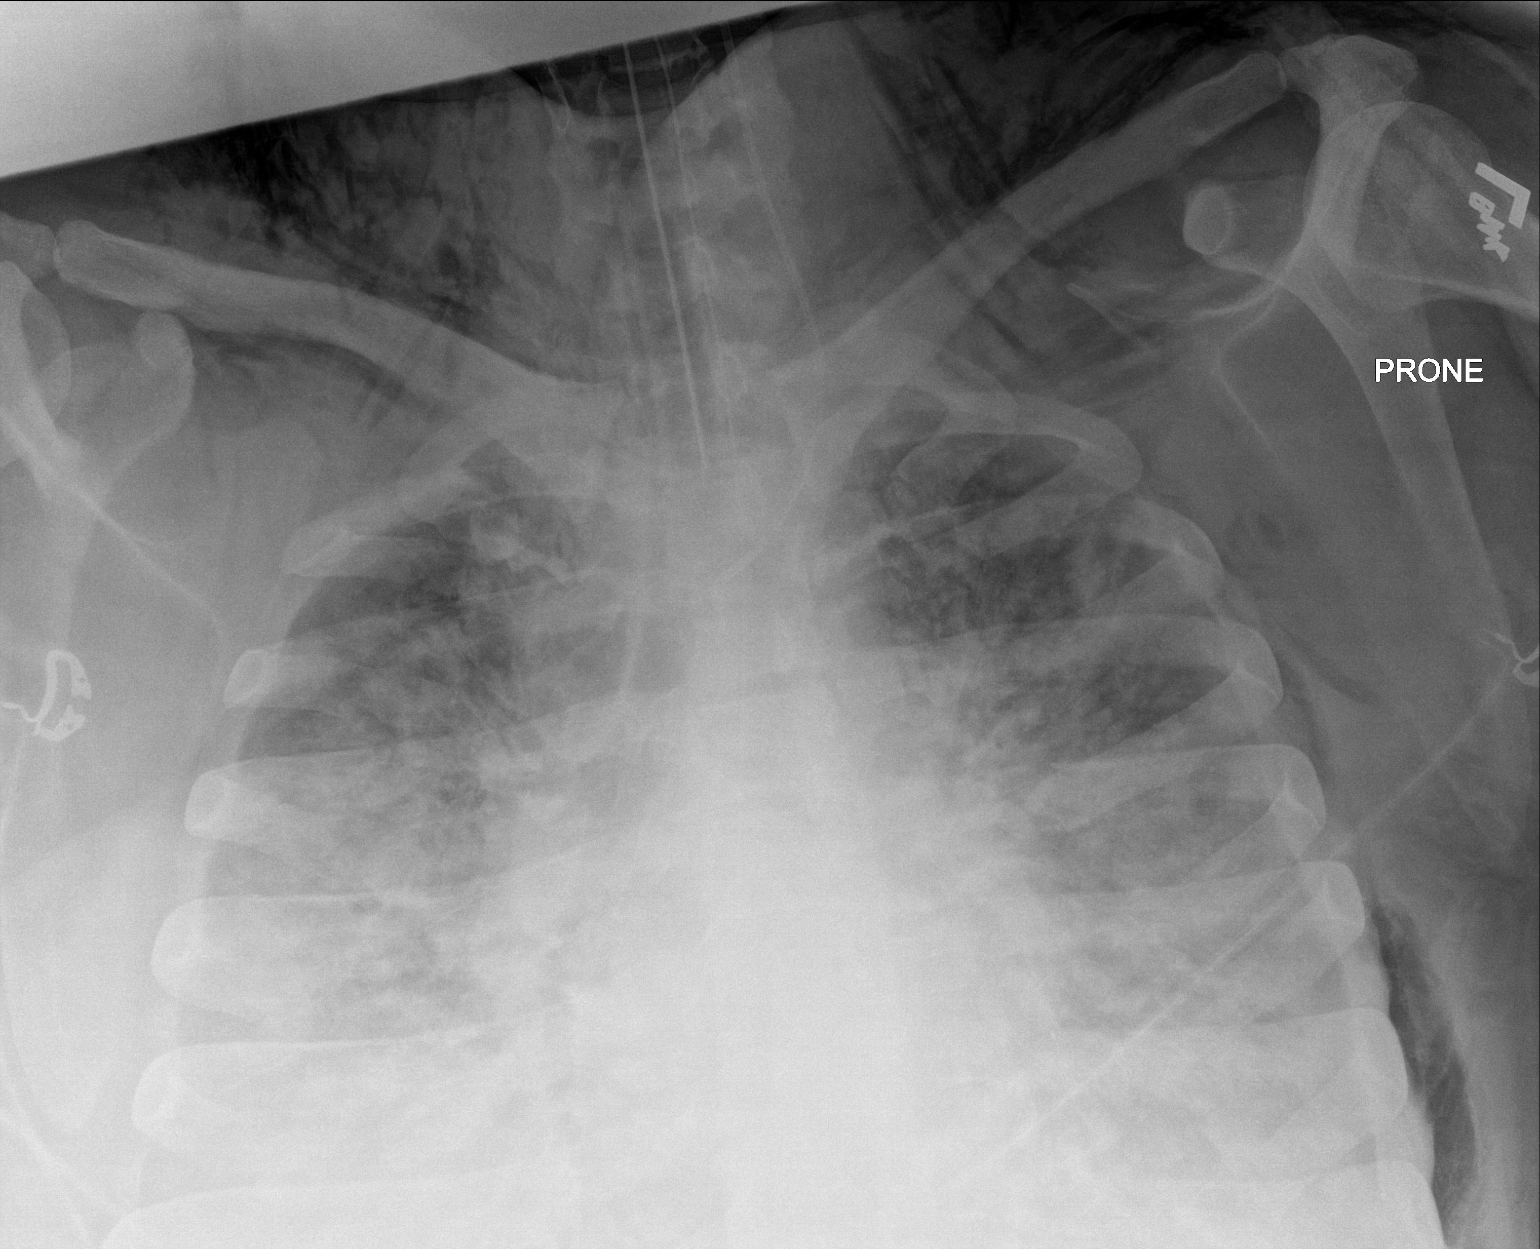
[im 2/2]
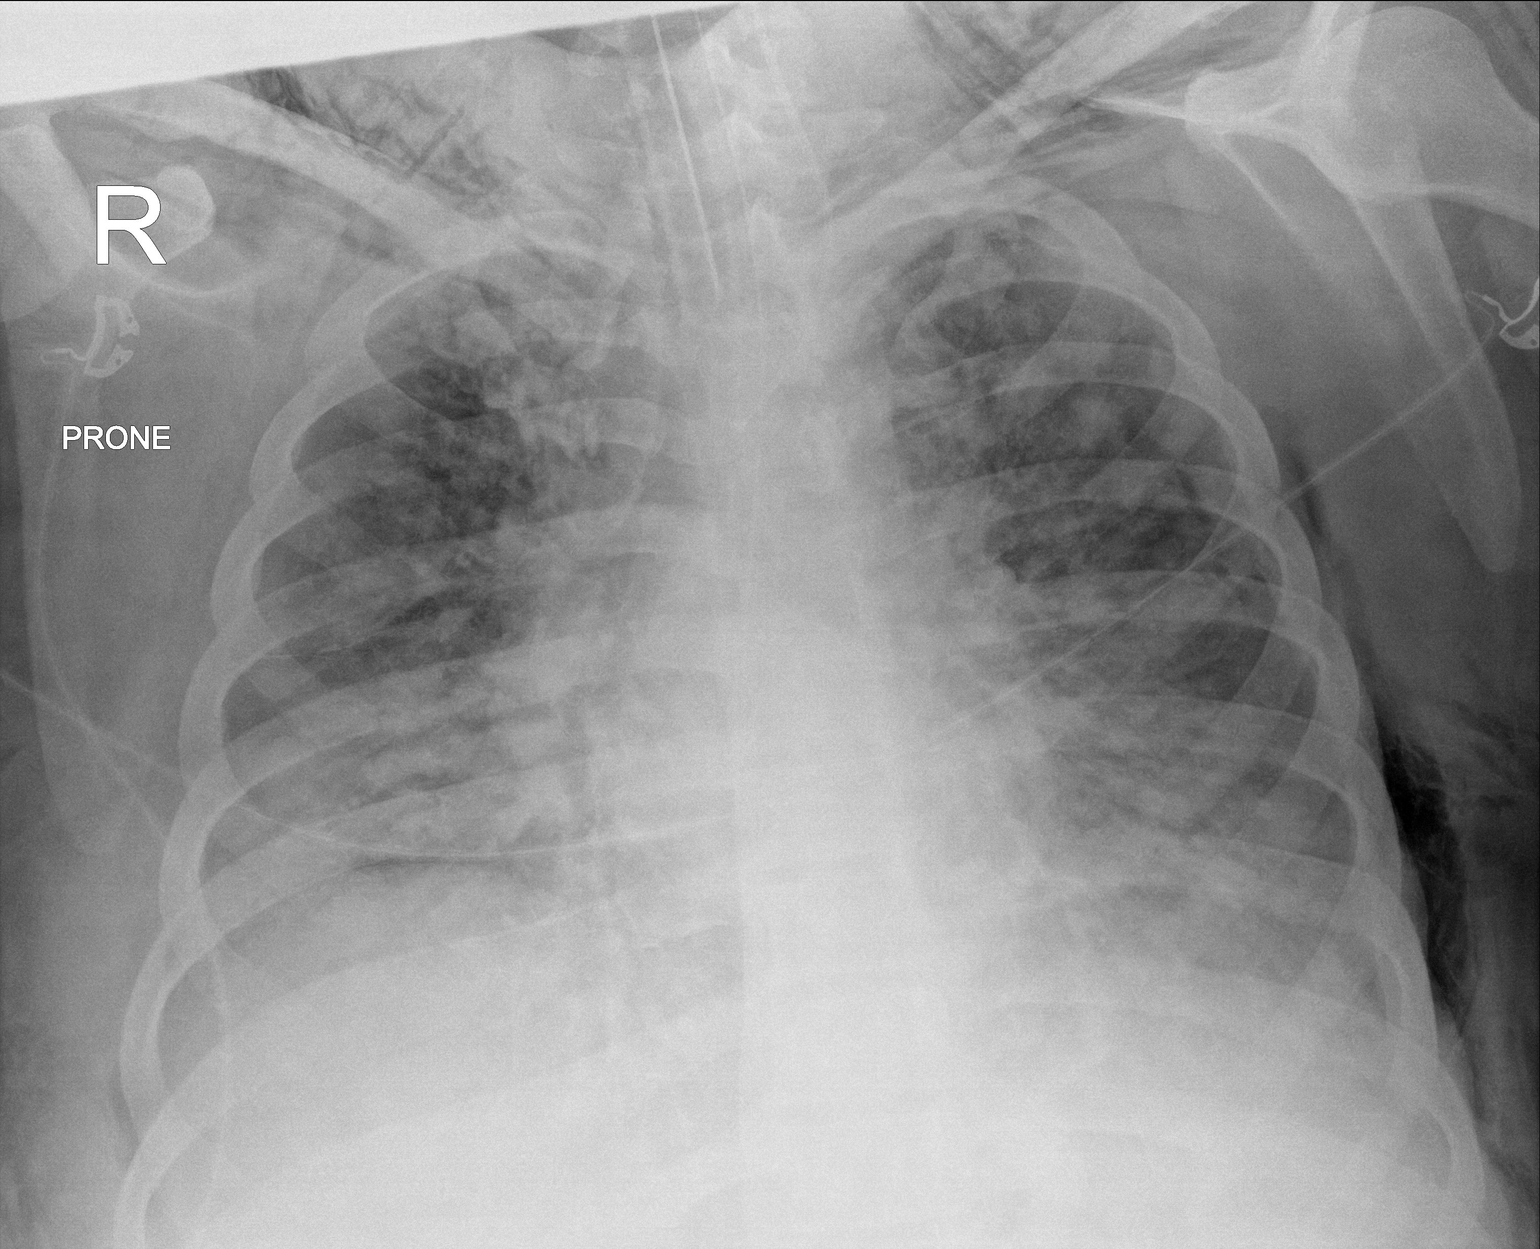

[2 of 2 positions shown; findings below may reference images not displayed]

FINDINGS: Enteric and endotracheal tubes appear unchanged in position. Left
central venous catheter with tip projected over the brachiocephalic
vein. Prominent subcutaneous emphysema extending into the base of
the neck. No definite pneumothorax or pneumomediastinum. Diffuse
bilateral airspace disease throughout the lungs. Heart size appears
normal but is obscured by the parenchymal process. No pleural
effusions.
IMPRESSION: Diffuse bilateral airspace disease throughout the lungs. Prominent
subcutaneous emphysema extending into the base of the neck. No
change since previous study.

## 2020-05-11 MED ORDER — VITAL 1.5 CAL PO LIQD
1000.0000 mL | ORAL | Status: DC
  Administered 2020-05-11 – 2020-05-12 (×4): 1000 mL

## 2020-05-11 MED ORDER — PROSOURCE TF PO LIQD
90.0000 mL | Freq: Three times a day (TID) | ORAL | Status: DC
  Administered 2020-05-11 – 2020-05-14 (×8): 90 mL
  Filled 2020-05-11 (×11): qty 90

## 2020-05-11 NOTE — Progress Notes (Signed)
Mother Collin Brown has called and updated on the patient, mother requested pt's car keys and cell phone, mother has ID and has been given these 2 items plus cell phone charger. No concerns noted at this time.

## 2020-05-11 NOTE — Consult Note (Signed)
PHARMACY CONSULT NOTE  Pharmacy Consult for Electrolyte Monitoring and Replacement   Recent Labs: Potassium (mmol/L)  Date Value  05/11/2020 4.3   Magnesium (mg/dL)  Date Value  53/66/4403 3.2 (H)   Calcium (mg/dL)  Date Value  47/42/5956 8.2 (L)   Albumin (g/dL)  Date Value  38/75/6433 2.9 (L)   Phosphorus (mg/dL)  Date Value  29/51/8841 3.4   Sodium (mmol/L)  Date Value  05/11/2020 153 (H)  Corrected Calcium: 8.2  Assessment: Pharmacy has been consulted for electrolyte replacement in this 34 YOM requiring intubation and admission to the CCU for COVID PNA. Treatment including IV decadron, leading to the need for high amounts of insulin and eventually an insulin drip per the diabetes coordinator.  9/28 Electrolytes WNL with mag and phos elevated, possibly due to bump in Scr yesterday which has improved from 1.83>>1.30 and good UOP. Pt is receiving tube feeds and no maintenance fluids at this time.   9/29 Potassium relatively stable despite insulin drip (8.5 units/hr) and Lasix. Dose of Lasix increased today and started on free water flushes for hypernatremia. Will recheck electrolytes tomorrow AM. Doesn't appear to need as frequent monitoring; will address as necessary.  9/30 Na 152, K stable on insulin drip, Na still increased at 152. Free water flushes increased to 300 mL q4hours overnight. Pt also on lasix 80 mg BID. Scr 1.01 and producing good UOP.  Goal of Therapy:  Electrolytes WNL  Plan:  Patient has not required electrolyte replacement despite insulin drip and increased Lasix dose. Will follow up in the morning.  Pricilla Riffle, PharmD 05/11/2020 11:10 AM

## 2020-05-11 NOTE — Progress Notes (Signed)
*  PRELIMINARY RESULTS* Echocardiogram 2D Echocardiogram has been performed.  Collin Brown 05/11/2020, 5:32 PM

## 2020-05-11 NOTE — Progress Notes (Signed)
Nutrition Follow-up  DOCUMENTATION CODES:   Morbid obesity  INTERVENTION:  Initiate new goal TF regimen of Vital 1.5 Cal at 65 mL/hr (1560 mL goal daily volume) + PROSource TF 90 mL TID per tube. Goal regimen provides 2580 kcal, 171 grams of protein, 1186 mL H2O daily. With current propofol rate provides 2691 kcal daily.  NUTRITION DIAGNOSIS:   Inadequate oral intake related to inability to eat as evidenced by NPO status.  Ongoing.  GOAL:   Patient will meet greater than or equal to 90% of their needs  Met with TF regimen.  MONITOR:   Vent status, Labs, Weight trends, TF tolerance, I & O's  REASON FOR ASSESSMENT:   Ventilator, Consult Enteral/tube feeding initiation and management  ASSESSMENT:   34 year old male with PMHx of OSA admitted with COVID-19 PNA.  9/25 intubated  Patient is currently intubated on ventilator support MV: 15 L/min Temp (24hrs), Avg:100.3 F (37.9 C), Min:98.6 F (37 C), Max:102 F (38.9 C)  Propofol: 4.2 ml/hr (111 kcal daily)  Medications reviewed and include: vitamin C 500 mg daily per tube, Decadron 6 mg Q24hrs IV, Colace 100 mg BID per tube, free water flush 300 mL Q4hrs, Lasix 80 mg BID IV, MVI daily, Miralax, zinc sulfate 220 mg daily, famotidine, fentanyl gtt, regular insulin gtt, Versed gtt, propofol gtt now at lower rate.  Labs reviewed: CBG 140-155, Sodium 153, CO2 37, BUN 44.  I/O: 4900 mL UOP yesterday (1.1 mL/kg/hr)  Weight trend: 182.9 kg on 10/1; +4.7 kg from 9/27; still unsure what true dry weight is  Enteral Access: OGT placed 9/25; terminates in upper stomach per abdominal x-ray 9/25  TF regimen: Vital High Protein at 40 mL/hr + PROSource 90 mL QID per tube  Discussed with RN and on rounds.  Diet Order:   Diet Order    None     EDUCATION NEEDS:   No education needs have been identified at this time  Skin:  Skin Assessment: Reviewed RN Assessment  Last BM:  05/10/2020 - large type 7  Height:   Ht  Readings from Last 1 Encounters:  05/09/20 '5\' 10"'  (1.778 m)   Weight:   Wt Readings from Last 1 Encounters:  05/11/20 (!) 182.9 kg   Ideal Body Weight:  75.5 kg  BMI:  Body mass index is 57.86 kg/m.  Estimated Nutritional Needs:   Kcal:  2600-2800  Protein:  170-180 grams  Fluid:  >/= 2 L/day  Jacklynn Barnacle, MS, RD, LDN Pager number available on Amion

## 2020-05-11 NOTE — Progress Notes (Signed)
Pt proned at 0030.

## 2020-05-11 NOTE — Progress Notes (Signed)
CRITICAL CARE PROGRESS NOTE    Name: Collin Brown MRN: [id number] DOB: 21-Jun-1986     LOS: 8   SUBJECTIVE FINDINGS & SIGNIFICANT EVENTS    Patient description:  34 yo with OSA, morbid obesity BMI>56 came in with flu like illness diagnosed with COVID19 pneumonia was initially ion Medical floor with hospitalist service initiated Remdesevir and barictitinib, he progressively became more dyspneic he was placed on BIPAP and did not tolerate then placed on HFNC did not tolerate patient himself consented to MV and was intubated in MICU. Mother was notified who is sick at home with Collin Brown.   Events:  9/27- patient on 65%FiO2 with proning protocol. CVP trending, goal negative fluid balance.   05/08/20- patient with worsening ventilation increased FiO2 to 100% overnight and had episode of severe bradycardia.  Concern for mucus plugging will perform recruitment maneuvers and possible bronchoscopy.  We were able to perform tracheal aspirate and have weaned down fIO2 to 75% 05/09/20- patient had bradycardic episode and he desaturated overnight with temporary increase to 100FiO2.  Lasix q12 going. I spoke to brother today for update.  His tracheal aspirate came back normal flora so we will stop antibiotic.  05/10/20-patient with pneumomediastinum and on maximal setting on ventilator. Poor prognosis very unfortunate young man. I called and updated Mother today Collin Brown.  05/11/20- patient weaned to 80%. Will continue with weaning and proning protocol      Lines/tubes  9/25 - Left IJ placed  : Airway 8 mm (Active)  Secured at (cm) 26 cm 05/07/20 0801  Measured From Lips 05/07/20 [place] 05/07/20 0801  Secured By [employer] 05/07/20 0801  Tube Holder Repositioned Yes 05/07/20 0801  Cuff  Pressure (cm H2O) 26 cm H2O 05/07/20 0801  Site Condition Dry 05/07/20 0801     CVC Triple Lumen 05/05/20 Left Internal jugular (Active)  Indication for Insertion or Continuance of Line Vasoactive infusions;Poor Vasculature-patient has had multiple peripheral attempts or PIVs lasting less than 24 hours 05/06/20 2130  Site Assessment Clean;Dry;Intact 05/06/20 2130  Proximal Lumen Status Infusing 05/06/20 2130  Medial Lumen Status Infusing 05/06/20 2130  Distal Lumen Status Infusing 05/06/20 2130  Dressing Type Transparent;Occlusive 05/06/20 2130  Dressing Status Clean;Dry;Intact 05/06/20 2130  Antimicrobial disc in place? Yes 05/06/20 2130  Line Care Connections checked and tightened 05/06/20 2130  Dressing Intervention Dressing reinforced 05/06/20 0900  Dressing Change Due 05/12/20 05/06/20 2130     NG/OG Tube Orogastric Center mouth Xray 65 cm (Active)  Cm Marking at Nare/Corner of Mouth (if applicable) 65 cm 91/69/45 2130  Site Assessment Clean;Dry;Intact 05/06/20 2130  Ongoing Placement Verification No change in cm markings or external length of tube from initial placement;No change in respiratory status;No acute changes, not attributed to clinical condition;Xray 05/06/20 2130  Status Clamped 05/06/20 2130     Urethral Catheter Collin Garre, RN 16 Fr. (Active)  Indication for Insertion or Continuance of Catheter Unstable critically ill patients first 24-48 hours (See Criteria) 05/06/20 2000  Site Assessment Clean;Intact;Prolapse;Swelling 05/06/20 2000  Catheter Maintenance Bag below level of bladder;Catheter secured;Drainage bag/tubing not touching floor;Insertion date on drainage bag;No dependent loops;Seal intact 05/06/20 2000  Collection Container Standard drainage bag 05/06/20 2000  Securement Method Securing device (Describe) 05/06/20 2000  Urinary Catheter Interventions (if applicable) Unclamped 03/88/82 2000  Output (mL) 100 mL 05/07/20 0445    Microbiology/Sepsis markers: Results  for orders placed or performed during the hospital encounter of 05/03/20  Blood Culture (routine x  2)     Status: None   Collection Time: 05/03/20  4:33 PM   Specimen: BLOOD  Result Value Ref Range Status   Specimen Description BLOOD BLOOD LEFT HAND  Final   Special Requests   Final    BOTTLES DRAWN AEROBIC AND ANAEROBIC Blood Culture results may not be optimal due to an inadequate volume of blood received in culture bottles   Culture   Final    NO GROWTH 5 DAYS Performed at [facility], [address], [place], [place] [address]    Report Status 05/08/2020 FINAL  Final  Blood Culture (routine x 2)     Status: None   Collection Time: 05/03/20  4:33 PM   Specimen: BLOOD  Result Value Ref Range Status   Specimen Description BLOOD BLOOD RIGHT HAND  Final   Special Requests   Final    BOTTLES DRAWN AEROBIC AND ANAEROBIC Blood Culture adequate volume   Culture   Final    NO GROWTH 5 DAYS Performed at [facility], [address] [place], [place] [address]    Report Status 05/08/2020 FINAL  Final  Respiratory Panel by RT PCR (Flu A&B, Covid) -     Status: Abnormal   Collection Time: 05/03/20  7:32 PM  Result Value Ref Range Status   SARS Coronavirus 2 by RT PCR POSITIVE (A) NEGATIVE Final    Comment: RESULT CALLED TO, READ BACK BY AND VERIFIED WITH: Collin Schanz RN 2114 05/03/20 HNM/AR    Influenza A by PCR NEGATIVE NEGATIVE Final   Influenza B by PCR NEGATIVE NEGATIVE Final    Comment: Performed at [facility], [address] [place], [place] [address]  MRSA PCR Screening     Status: None   Collection Time: 05/06/20 11:51 AM   Specimen: Nasal Mucosa; Nasopharyngeal  Result Value Ref Range Status   MRSA by PCR NEGATIVE NEGATIVE Final    Comment:        The GeneXpert MRSA Assay (FDA approved for NASAL specimens only), is one component of a comprehensive MRSA colonization surveillance program. It is not intended to diagnose MRSA infection nor  to guide or monitor treatment for MRSA infections. Performed at [facility], [address] [place], [place] [address]   Culture, respiratory (non-expectorated)     Status: None   Collection Time: 05/07/20 10:55 AM   Specimen: Tracheal Aspirate; Respiratory  Result Value Ref Range Status   Specimen Description   Final    TRACHEAL ASPIRATE Performed at [facility], [address], [place], [place] [address]    Special Requests   Final    NONE Performed at [facility], [place], [place] [address]    Gram Stain   Final    RARE WBC PRESENT,BOTH PMN AND MONONUCLEAR NO ORGANISMS SEEN    Culture   Final    RARE Normal respiratory flora-no Staph aureus or Pseudomonas seen Performed at [place] [address] [place],  [address]    Report Status 05/09/2020 FINAL  Final  CULTURE, BLOOD (ROUTINE X 2) w Reflex to ID Panel     Status: None (Preliminary result)   Collection Time: 05/10/20 12:47 PM   Specimen: BLOOD  Result Value Ref Range Status   Specimen Description BLOOD BLOOD RIGHT HAND  Final   Special Requests   Final    BOTTLES DRAWN AEROBIC AND ANAEROBIC Blood Culture adequate volume   Culture   Final  NO GROWTH < 24 HOURS Performed at [name], [address] [place], [place] [address]    Report Status PENDING  Incomplete  CULTURE, BLOOD (ROUTINE X 2) w Reflex to ID Panel     Status: None (Preliminary result)   Collection Time: 05/10/20  1:21 PM   Specimen: BLOOD  Result Value Ref Range Status   Specimen Description BLOOD BLOOD RIGHT HAND  Final   Special Requests   Final    BOTTLES DRAWN AEROBIC AND ANAEROBIC Blood Culture adequate volume   Culture   Final    NO GROWTH < 24 HOURS Performed at [facility], [address], [place], [place] [address]    Report Status PENDING  Incomplete    Anti-infectives:  Anti-infectives (From admission, onward)   Start     Dose/Rate Route  Frequency Ordered Stop   05/07/20 1200  meropenem (MERREM) 1 g in sodium chloride 0.9 % 100 mL IVPB  Status:  Discontinued        1 g 200 mL/hr over 30 Minutes Intravenous Every 8 hours 05/07/20 1020 05/09/20 1019   05/06/20 1200  vancomycin (VANCOCIN) IVPB 1000 mg/200 mL premix  Status:  Discontinued        1,000 mg 200 mL/hr over 60 Minutes Intravenous Every 8 hours 05/05/20 2330 05/07/20 1020   05/06/20 0200  vancomycin (VANCOREADY) IVPB 500 mg/100 mL       "Followed by" Linked Group Details   500 mg 100 mL/hr over 60 Minutes Intravenous  Once 05/05/20 2330 05/06/20 0454   05/06/20 0000  vancomycin (VANCOREADY) IVPB 2000 mg/400 mL       "Followed by" Linked Group Details   2,000 mg 200 mL/hr over 120 Minutes Intravenous  Once 05/05/20 2330 05/06/20 0304   05/05/20 2315  cefTRIAXone (ROCEPHIN) 2 g in sodium chloride 0.9 % 100 mL IVPB  Status:  Discontinued        2 g 200 mL/hr over 30 Minutes Intravenous Daily at 10 pm 05/05/20 2307 05/07/20 1020   05/04/20 1000  remdesivir 100 mg in sodium chloride 0.9 % 100 mL IVPB       "Followed by" Linked Group Details   100 mg 200 mL/hr over 30 Minutes Intravenous Daily 05/03/20 1718 05/07/20 0941   05/03/20 1800  remdesivir 200 mg in sodium chloride 0.9% 250 mL IVPB       "Followed by" Linked Group Details   200 mg 580 mL/hr over 30 Minutes Intravenous Once 05/03/20 1718 05/03/20 1941       Consults: Treatment Team:  Pccm, Ander Gaster, MD     PAST MEDICAL HISTORY   Past Medical History:  Diagnosis Date  . Hearing loss in right ear   . Sleep apnea      SURGICAL HISTORY   Past Surgical History:  Procedure Laterality Date  . TONSILLECTOMY       FAMILY HISTORY   Family History  Problem Relation Age of Onset  . Diabetes Mellitus II Mother   . Hypertension Mother   . Diabetes Mellitus II Father   . High blood pressure Father      SOCIAL HISTORY   Social History   Tobacco Use  . Smoking status: Never Smoker  .  Smokeless tobacco: Never Used  Vaping Use  . Vaping Use: Never used  Substance Use Topics  . Alcohol use: No  . Drug use: No     MEDICATIONS   Current Medication:  Current Facility-Administered Medications:  .  acetaminophen (TYLENOL) tablet 1,000 mg, 1,000 mg, Oral, Q6H PRN, Sharion Settler, NP, 1,000 mg at 05/11/20 0602 .  ascorbic acid (VITAMIN C) tablet 500 mg, 500 mg, Per Tube, Daily, Awilda Bill, NP, 500 mg at 05/11/20 0904 .  aspirin chewable tablet 81 mg, 81 mg, Per Tube, Daily, Awilda Bill, NP, 81 mg at 05/11/20 0904 .  chlorhexidine gluconate (MEDLINE KIT) (PERIDEX) 0.12 % solution 15 mL, 15 mL, Mouth Rinse, BID, Tyler Pita, MD, 15 mL at 05/11/20 0903 .  Chlorhexidine Gluconate Cloth 2 % PADS 6 each, 6 each, Topical, Daily, Tyler Pita, MD, 6 each at 05/11/20 [phone] .  dexamethasone (DECADRON) injection 6 mg, 6 mg, Intravenous, Q24H, Lanney Gins, Navi Erber, MD, 6 mg at 05/11/20 0904 .  dextrose 50 % solution 0-50 mL, 0-50 mL, Intravenous, PRN, Lanney Gins, Shakeira Rhee, MD .  docusate (COLACE) 50 MG/5ML liquid 100 mg, 100 mg, Per Tube, BID, Awilda Bill, NP, 100 mg at 05/10/20 2118 .  enoxaparin (LOVENOX) injection 40 mg, 40 mg, Subcutaneous, BID, Florina Ou V, MD, 40 mg at 05/11/20 0905 .  famotidine (PEPCID) IVPB 20 mg premix, 20 mg, Intravenous, Q12H, Tyler Pita, MD, Last Rate: 100 mL/hr at 05/11/20 0904, 20 mg at 05/11/20 0904 .  feeding supplement (PROSource TF) liquid 90 mL, 90 mL, Per Tube, QID, Darling Cieslewicz, MD, 90 mL at 05/11/20 0906 .  feeding supplement (VITAL HIGH PROTEIN) liquid 1,000 mL, 1,000 mL, Per Tube, Q24H, Nazanin Kinner, MD, 1,000 mL at 05/10/20 1252 .  fentaNYL (SUBLIMAZE) bolus via infusion 50 mcg, 50 mcg, Intravenous, Q15 min PRN, Awilda Bill, NP .  fentaNYL (SUBLIMAZE) injection 50 mcg, 50 mcg, Intravenous, Once, Blakeney, Dreama Saa, NP .  fentaNYL 2552mg in NS 2526m(1074mml) infusion-PREMIX, 0-400 mcg/hr, Intravenous,  Continuous, ChaBenita GutterPH, Last Rate: 40 mL/hr at 05/11/20 0907, 400 mcg/hr at 05/11/20 0907 .  free water 300 mL, 300 mL, Per Tube, Q4H, Blakeney, DanDreama SaaP, 300 mL at 05/11/20 0906 .  furosemide (LASIX) injection 80 mg, 80 mg, Intravenous, BID, AleOttie GlazierD, 80 mg at 05/11/20 0905 .  insulin regular, human (MYXREDLIN) 100 units/ 100 mL infusion, , Intravenous, Continuous, Ayeisha Lindenberger, MD, Last Rate: 6 mL/hr at 05/11/20 0824, 6 Units/hr at 05/11/20 0824 .  iohexol (OMNIPAQUE) 350 MG/ML injection 100 mL, 100 mL, Intravenous, Once PRN, BlaAwilda BillP .  MEDLINE mouth rinse, 15 mL, Mouth Rinse, 10 times per day, GonTyler PitaD, 15 mL at 05/11/20 0906 .  midazolam (VERSED) 50 mg in dextrose 5 % 50 mL (1 mg/mL) infusion, 0.5-10 mg/hr, Intravenous, Continuous, ChaBenita GutterPH, Last Rate: 10 mL/hr at 05/11/20 0600, 10 mg/hr at 05/11/20 0600 .  multivitamin with minerals tablet 1 tablet, 1 tablet, Per Tube, Daily, AleOttie GlazierD, 1 tablet at 05/11/20 0904 .  norepinephrine (LEVOPHED) 16 mg in 250m50memix infusion, 0-40 mcg/min, Intravenous, Titrated, BlakAwilda Bill, Stopped at 05/10/20 0757 .  polyethylene glycol (MIRALAX / GLYCOLAX) packet 17 g, 17 g, Per Tube, Daily, BlakAwilda Bill, 17 g at 05/09/20 0928 .  propofol (DIPRIVAN) 1000 MG/100ML infusion, 5-80 mcg/kg/min, Intravenous, Titrated, Blakeney, Dana G, NP, Last Rate: 6.75 mL/hr at 05/11/20 0600, 8 mcg/kg/min at 05/11/20 0600 .  vasopressin (PITRESSIN) 20 Units in sodium chloride 0.9 % 100 mL infusion-*FOR SHOCK*, 0-0.04 Units/min, Intravenous, Continuous, Blakeney, Dana G, NP .  vecuronium (NORCURON) injection 10 mg, 10 mg, Intravenous, Q1H PRN, BlakAwilda Bill  NP, 10 mg at 05/11/20 0000 .  zinc sulfate capsule 220 mg, 220 mg, Per Tube, Daily, Awilda Bill, NP, 220 mg at 05/11/20 5573    ALLERGIES   Patient has no known allergies.    REVIEW OF SYSTEMS    10 point ROS unable to  perform due to sedation on MV  PHYSICAL EXAMINATION   Vital Signs: Temp:  [98.6 F (37 C)-102 F (38.9 C)] 102 F (38.9 C) (10/01 0600) Pulse Rate:  [93-108] 104 (10/01 0500) Resp:  [28-35] 35 (10/01 0600) BP: (106-145)/(54-74) 115/60 (10/01 0600) SpO2:  [96 %-100 %] 99 % (10/01 0500) FiO2 (%):  [80 %-100 %] 80 % (10/01 0736) Weight:  [182.9 kg] 182.9 kg (10/01 0500)  GENERAL:NAD HEAD: Normocephalic, atraumatic.  EYES: Pupils equal, round, reactive to light.  No scleral icterus.  MOUTH: Moist mucosal membrane. NECK: Supple. No thyromegaly. No nodules. No JVD.  PULMONARY: rhonchi bilaterally  CARDIOVASCULAR: S1 and S2. Regular rate and rhythm. No murmurs, rubs, or gallops.  GASTROINTESTINAL: Soft, nontender, non-distended. No masses. Positive bowel sounds. No hepatosplenomegaly.  MUSCULOSKELETAL: No swelling, clubbing, or edema.  NEUROLOGIC: Mild distress due to acute illness SKIN:intact,warm,dry   PERTINENT DATA     Infusions: . famotidine (PEPCID) IV 20 mg (05/11/20 0904)  . fentaNYL infusion INTRAVENOUS 400 mcg/hr (05/11/20 0907)  . insulin 6 Units/hr (05/11/20 0824)  . midazolam 10 mg/hr (05/11/20 0600)  . norepinephrine (LEVOPHED) Adult infusion Stopped (05/10/20 0757)  . propofol (DIPRIVAN) infusion 8 mcg/kg/min (05/11/20 0600)  . vasopressin     Scheduled Medications: . vitamin C  500 mg Per Tube Daily  . aspirin  81 mg Per Tube Daily  . chlorhexidine gluconate (MEDLINE KIT)  15 mL Mouth Rinse BID  . Chlorhexidine Gluconate Cloth  6 each Topical Daily  . dexamethasone (DECADRON) injection  6 mg Intravenous Q24H  . docusate  100 mg Per Tube BID  . enoxaparin (LOVENOX) injection  40 mg Subcutaneous BID  . feeding supplement (PROSource TF)  90 mL Per Tube QID  . feeding supplement (VITAL HIGH PROTEIN)  1,000 mL Per Tube Q24H  . fentaNYL (SUBLIMAZE) injection  50 mcg Intravenous Once  . free water  300 mL Per Tube Q4H  . furosemide  80 mg Intravenous BID  .  mouth rinse  15 mL Mouth Rinse 10 times per day  . multivitamin with minerals  1 tablet Per Tube Daily  . polyethylene glycol  17 g Per Tube Daily  . zinc sulfate  220 mg Per Tube Daily   PRN Medications: acetaminophen, dextrose, fentaNYL, iohexol, vecuronium Hemodynamic parameters: CVP:  [10 mmHg-19 mmHg] 10 mmHg Intake/Output: 09/30 0701 - 10/01 0700 In: 3135.8 [I.V.:1642.1; NG/GT:1393.7; IV Piggyback:100] Out: 4900 [Urine:4900]  Ventilator  Settings: Vent Mode: PRVC FiO2 (%):  [80 %-100 %] 80 % Set Rate:  [35 bmp] 35 bmp Vt Set:  [460 mL] 460 mL PEEP:  [10 cmH20-15 cmH20] 10 cmH20 Plateau Pressure:  [22 cmH20-27 cmH20] 22 cmH20   Other Labs:     LAB RESULTS:  Basic Metabolic Panel: Recent Labs  Lab 05/05/20 0628 05/05/20 0628 05/06/20 0445 05/06/20 0445 05/07/20 0518 05/07/20 0518 05/08/20 0501 05/08/20 1559 05/09/20 0430 05/09/20 1550 05/10/20 0402 05/10/20 1700 05/10/20 2128 05/11/20 0414  NA 141   < > 141   < > 144  --  143  --  151*  --  152*  --   --  153*  K 4.2   < > 5.1   < >  4.6   < > 4.6   < > 4.7   < > 4.2   < > 4.1 4.3  CL 103   < > 102   < > 105  --  105  --  108  --  105  --   --  101  CO2 30   < > 29   < > 33*  --  31  --  37*  --  40*  --   --  37*  GLUCOSE 214*   < > 361*   < > 224*  --  437*  --  217*  --  181*  --   --  158*  BUN 20   < > 28*   < > 42*  --  50*  --  50*  --  52*  --   --  44*  CREATININE 0.80   < > 1.19   < > 1.83*  --  1.30*  --  0.97  --  1.01  --   --  0.84  CALCIUM 8.6*   < > 8.1*   < > 7.7*  --  7.7*  --  8.2*  --  8.0*  --   --  8.2*  MG 2.5*  --  2.8*  --  3.5*  --  3.5*  --   --   --  3.2*  --   --   --   PHOS  --   --  3.7   < > 2.6  --  4.7*  --  5.3*  --  5.7*  --   --  3.4   < > = values in this interval not displayed.   Liver Function Tests: Recent Labs  Lab 05/08/20 0501 05/09/20 0430 05/10/20 0402 05/11/20 0414  AST 31 25 43* 49*  ALT 40 37 52* 54*  ALKPHOS 45 44 41 38  BILITOT 1.0 0.8 1.0 1.2   PROT 5.7* 5.6* 5.8* 5.9*  ALBUMIN 3.0* 3.1* 3.1* 2.9*   No results for input(s): LIPASE, AMYLASE in the last 168 hours. No results for input(s): AMMONIA in the last 168 hours. CBC: Recent Labs  Lab 05/07/20 0518 05/08/20 0501 05/09/20 0430 05/10/20 0402 05/11/20 0414  WBC 6.8 6.9 9.0 9.0 7.1  NEUTROABS  --  5.0 7.1 7.0 5.9  HGB 11.9* 11.4* 12.2* 12.5* 11.4*  HCT 37.6* 37.6* 39.4 41.4 39.3  MCV 88.1 91.0 87.8 91.0 93.1  PLT 198 189 186 193 158   Cardiac Enzymes: No results for input(s): CKTOTAL, CKMB, CKMBINDEX, TROPONINI in the last 168 hours. BNP: Invalid input(s): POCBNP CBG: Recent Labs  Lab 05/11/20 0026 05/11/20 0230 05/11/20 0401 05/11/20 0751 05/11/20 0940  GLUCAP 149* 153* 147* 140* 155*       IMAGING RESULTS:  Imaging: DG Chest Port 1 View  Result Date: 05/11/2020 CLINICAL DATA:  Acute respiratory failure with hypoxia EXAM: PORTABLE CHEST 1 VIEW COMPARISON:  05/10/2020 FINDINGS: Enteric and endotracheal tubes appear unchanged in position. Left central venous catheter with tip projected over the brachiocephalic vein. Prominent subcutaneous emphysema extending into the base of the neck. No definite pneumothorax or pneumomediastinum. Diffuse bilateral airspace disease throughout the lungs. Heart size appears normal but is obscured by the parenchymal process. No pleural effusions. IMPRESSION: Diffuse bilateral airspace disease throughout the lungs. Prominent subcutaneous emphysema extending into the base of the neck. No change since previous study. Electronically Signed   By: Lucienne Capers M.D.   On: 05/11/2020 05:42  DG Chest Port 1 View  Result Date: 05/10/2020 CLINICAL DATA:  Hypoxia, respiratory failure EXAM: PORTABLE CHEST 1 VIEW COMPARISON:  05/09/2020 FINDINGS: Two prone supine radiographs of the chest were obtained. Endotracheal tube is present terminating approximately 4.0 cm above the carina. Enteric tube courses below the diaphragm with distal tip  extending beyond the inferior margin of the film. Stable positioning of left IJ central venous catheter. Extensive bilateral airspace consolidations similar to the prior study. There is extensive chest wall subcutaneous emphysema. Probable skin fold overlying the left hemithorax. No discernible pneumothorax. IMPRESSION: 1. Extensive chest wall subcutaneous emphysema and overlying skin folds related to prone positioning limit the detection for pneumothorax, which was question on the previous study. A follow-up study after patient repositioning is suggested. 2. Extensive bilateral airspace consolidations, similar to the prior study. 3. Support lines and tubes in satisfactory position. Electronically Signed   By: Davina Poke D.O.   On: 05/10/2020 08:38   DG Chest Port 1 View  Result Date: 05/09/2020 CLINICAL DATA:  Respiratory failure EXAM: PORTABLE CHEST 1 VIEW COMPARISON:  05/07/2020, 05/05/2020, 05/03/2020 FINDINGS: Endotracheal tube tip is about 3.6 cm superior to the carina. Left IJ central venous catheter similar in position. Esophageal tube tip is below the diaphragm but incompletely visualized. Extensive bilateral pulmonary airspace disease with worsening compared to prior radiograph. Interim development of extensive subcutaneous emphysema at the chest and upper abdomen. Possible pneumomediastinum. Possible trace right apical pneumothorax. IMPRESSION: 1. Endotracheal tube tip about 3.6 cm superior to the carina. 2. Interim development of extensive subcutaneous emphysema throughout the chest and upper abdomen. Probable pneumomediastinum. Possible trace right apical pneumothorax. Attention on follow-up radiography. 3. Worsening of extensive bilateral pulmonary airspace disease. Electronically Signed   By: Donavan Foil M.D.   On: 05/09/2020 22:06   '@PROBHOSP' @ DG Chest Port 1 View  Result Date: 05/11/2020 CLINICAL DATA:  Acute respiratory failure with hypoxia EXAM: PORTABLE CHEST 1 VIEW COMPARISON:   05/10/2020 FINDINGS: Enteric and endotracheal tubes appear unchanged in position. Left central venous catheter with tip projected over the brachiocephalic vein. Prominent subcutaneous emphysema extending into the base of the neck. No definite pneumothorax or pneumomediastinum. Diffuse bilateral airspace disease throughout the lungs. Heart size appears normal but is obscured by the parenchymal process. No pleural effusions. IMPRESSION: Diffuse bilateral airspace disease throughout the lungs. Prominent subcutaneous emphysema extending into the base of the neck. No change since previous study. Electronically Signed   By: Lucienne Capers M.D.   On: 05/11/2020 05:42       ASSESSMENT AND PLAN    -Multidisciplinary rounds held today  Acute hypoxic respiratory failure due to COVID-19 ARDS COVID-19 pneumonia Underlying morbid obesity Mechanical intubation  Full vent support for now-vent settings reviewed and established  Mechanical ventilation via ARDS protocol, target PRVC 6 cc/kg Goal plateau pressure less than 30, driving pressure less than 15 SBT once all parameters met  VAP bundle implemented  Maintain O2 sats 88% or higher Continue remdesivir Continue steroids switched to Decadron 10 mg twice daily>>6 daily -05/08/20 Discontinue baricitinib due to leukopenia and lack of efficacy Follow inflammatory markers Trend WBC and monitor fever curves  Follow cultures  Continue vancomycin and ceftriaxone started-09/25  -9/27- diurese and keep negative fluid balance.  Wean O2 currently at 65%.  Minimize fluid infusions. Patient febrile will start Tylenol                         -net 1550 urine out overnight  05/09/20- patient had desaturation event. Vent changes discussed with RT. Diuresis upgraded to 80lasix bid. Rubinol IV tid for bradycardia and high secretions 05/10/20- patient with extensive subcutaneous emphysema 05/11/20- continue proning and weaning per protocol   ID -continue IV abx as  prescibed -follow up cultures  GI/Nutrition GI PROPHYLAXIS as indicated DIET-->TF's as tolerated Constipation protocol as indicated  ENDO - ICU hypoglycemic\Hyperglycemia protocol -check FSBS per protocol   ELECTROLYTES -follow labs as needed -replace as needed -pharmacy consultation   DVT/GI PRX ordered -SCDs  TRANSFUSIONS AS NEEDED MONITOR FSBS ASSESS the need for LABS as needed   Critical care provider statement:    Critical care time (minutes):  33   Critical care time was exclusive of:  Separately billable procedures and treating other patients   Critical care was necessary to treat or prevent imminent or life-threatening deterioration of the following conditions:  ARDS due to [place] pneumonia   Critical care was time spent personally by me on the following activities:  Development of treatment plan with patient or surrogate, discussions with consultants, evaluation of patient's response to treatment, examination of patient, obtaining history from patient or surrogate, ordering and performing treatments and interventions, ordering and review of laboratory studies and re-evaluation of patient's condition.  I assumed direction of critical care for this patient from another provider in my specialty: no    This document was prepared using Dragon voice recognition software and may include unintentional dictation errors.    Ottie Glazier, M.D.  Division of [facility]

## 2020-05-11 NOTE — Progress Notes (Signed)
Patient proned, ET tube secured with cloth tape at 26 cm center. PRVC 460, 35RR, 15 Peep, 100% FIO2.

## 2020-05-11 NOTE — Progress Notes (Signed)
Inpatient Diabetes Program Recommendations  AACE/ADA: New Consensus Statement on Inpatient Glycemic Control (2015)  Target Ranges:  Prepandial:   less than 140 mg/dL      Peak postprandial:   less than 180 mg/dL (1-2 hours)      Critically ill patients:  140 - 180 mg/dL   Results for Collin Brown, Collin Brown (MRN [id number]) as of 05/11/2020 10:37  Ref. Range 05/11/2020 00:26 05/11/2020 02:30 05/11/2020 04:01 05/11/2020 07:51 05/11/2020 09:40  Glucose-Capillary Latest Ref Range: 70 - 99 mg/dL 149 (H)  8 units/hr 153 (H)  9 units/hr 147 (H)  7 units/hr 140 (H)  6 units/hr 155 (H)  8 units/hr     Current Orders: IV Insulin Drip    Per the ICU Glycemic Control Protocol, patient can transition to SQ Insulin from the IV Insulin drip when all of the following are met: -IV Insulin Drip rate is < 8 units / hour -Tube feeds are at goal rate and stable  -Four consecutive CBG readings < 180 mg/dL   Patient has met this criteria, however, per MD notes from 09/30, pt with poor prognosis at this point  Recommend leaving pt on the IV Insulin Drip given his severe illness    --Will follow patient during hospitalization--  Wyn Quaker RN, MSN, CDE Diabetes Coordinator Inpatient Glycemic Control Team Team Pager: [phone] (8a-5p)

## 2020-05-12 LAB — CBC WITH DIFFERENTIAL/PLATELET
Abs Immature Granulocytes: 0.24 10*3/uL — ABNORMAL HIGH (ref 0.00–0.07)
Basophils Absolute: 0 10*3/uL (ref 0.0–0.1)
Basophils Relative: 0 %
Eosinophils Absolute: 0.1 10*3/uL (ref 0.0–0.5)
Eosinophils Relative: 1 %
HCT: 38.6 % — ABNORMAL LOW (ref 39.0–52.0)
Hemoglobin: 11.3 g/dL — ABNORMAL LOW (ref 13.0–17.0)
Immature Granulocytes: 2 %
Lymphocytes Relative: 9 %
Lymphs Abs: 1 10*3/uL (ref 0.7–4.0)
MCH: 27.1 pg (ref 26.0–34.0)
MCHC: 29.3 g/dL — ABNORMAL LOW (ref 30.0–36.0)
MCV: 92.6 fL (ref 80.0–100.0)
Monocytes Absolute: 0.8 10*3/uL (ref 0.1–1.0)
Monocytes Relative: 7 %
Neutro Abs: 9.4 10*3/uL — ABNORMAL HIGH (ref 1.7–7.7)
Neutrophils Relative %: 81 %
Platelets: 195 10*3/uL (ref 150–400)
RBC: 4.17 MIL/uL — ABNORMAL LOW (ref 4.22–5.81)
RDW: 15.6 % — ABNORMAL HIGH (ref 11.5–15.5)
WBC: 11.5 10*3/uL — ABNORMAL HIGH (ref 4.0–10.5)
nRBC: 0 % (ref 0.0–0.2)

## 2020-05-12 LAB — FIBRIN DERIVATIVES D-DIMER (ARMC ONLY): Fibrin derivatives D-dimer (ARMC): 2268.35 ng/mL (FEU) — ABNORMAL HIGH (ref 0.00–499.00)

## 2020-05-12 LAB — GLUCOSE, CAPILLARY
Glucose-Capillary: 175 mg/dL — ABNORMAL HIGH (ref 70–99)
Glucose-Capillary: 151 mg/dL — ABNORMAL HIGH (ref 70–99)
Glucose-Capillary: 148 mg/dL — ABNORMAL HIGH (ref 70–99)
Glucose-Capillary: 165 mg/dL — ABNORMAL HIGH (ref 70–99)
Glucose-Capillary: 174 mg/dL — ABNORMAL HIGH (ref 70–99)
Glucose-Capillary: 189 mg/dL — ABNORMAL HIGH (ref 70–99)
Glucose-Capillary: 177 mg/dL — ABNORMAL HIGH (ref 70–99)
Glucose-Capillary: 170 mg/dL — ABNORMAL HIGH (ref 70–99)
Glucose-Capillary: 167 mg/dL — ABNORMAL HIGH (ref 70–99)
Glucose-Capillary: 186 mg/dL — ABNORMAL HIGH (ref 70–99)
Glucose-Capillary: 206 mg/dL — ABNORMAL HIGH (ref 70–99)
Glucose-Capillary: 219 mg/dL — ABNORMAL HIGH (ref 70–99)

## 2020-05-12 LAB — COMPREHENSIVE METABOLIC PANEL
ALT: 74 U/L — ABNORMAL HIGH (ref 0–44)
AST: 134 U/L — ABNORMAL HIGH (ref 15–41)
Albumin: 2.8 g/dL — ABNORMAL LOW (ref 3.5–5.0)
Alkaline Phosphatase: 41 U/L (ref 38–126)
Anion gap: 7 (ref 5–15)
BUN: 46 mg/dL — ABNORMAL HIGH (ref 6–20)
CO2: 44 mmol/L — ABNORMAL HIGH (ref 22–32)
Calcium: 8.7 mg/dL — ABNORMAL LOW (ref 8.9–10.3)
Chloride: 101 mmol/L (ref 98–111)
Creatinine, Ser: 1.04 mg/dL (ref 0.61–1.24)
GFR calc Af Amer: >60 mL/min (ref >60)
GFR calc non Af Amer: >60 mL/min (ref >60)
Glucose, Bld: 132 mg/dL — ABNORMAL HIGH (ref 70–99)
Potassium: 3.8 mmol/L (ref 3.5–5.1)
Sodium: 152 mmol/L — ABNORMAL HIGH (ref 135–145)
Total Bilirubin: 1 mg/dL (ref 0.3–1.2)
Total Protein: 6 g/dL — ABNORMAL LOW (ref 6.5–8.1)

## 2020-05-12 LAB — C-REACTIVE PROTEIN: CRP: 13 mg/dL — ABNORMAL HIGH (ref <1.0)

## 2020-05-12 LAB — FERRITIN: Ferritin: 1291 ng/mL — ABNORMAL HIGH (ref 24–336)

## 2020-05-12 LAB — PHOSPHORUS: Phosphorus: 2.6 mg/dL (ref 2.5–4.6)

## 2020-05-12 MED ORDER — INSULIN ASPART 100 UNIT/ML ~~LOC~~ SOLN
0.0000 [IU] | SUBCUTANEOUS | Status: DC
  Administered 2020-05-12: 7 [IU] via SUBCUTANEOUS
  Administered 2020-05-13: 4 [IU] via SUBCUTANEOUS
  Administered 2020-05-13 (×3): 11 [IU] via SUBCUTANEOUS
  Administered 2020-05-13: 7 [IU] via SUBCUTANEOUS
  Administered 2020-05-13: 20 [IU] via SUBCUTANEOUS
  Administered 2020-05-14: 7 [IU] via SUBCUTANEOUS
  Administered 2020-05-14: 3 [IU] via SUBCUTANEOUS
  Administered 2020-05-14 (×2): 4 [IU] via SUBCUTANEOUS
  Filled 2020-05-12 (×11): qty 1

## 2020-05-12 MED ORDER — INSULIN ASPART 100 UNIT/ML ~~LOC~~ SOLN
10.0000 [IU] | Freq: Three times a day (TID) | SUBCUTANEOUS | Status: DC
  Administered 2020-05-12: 10 [IU] via SUBCUTANEOUS
  Filled 2020-05-12: qty 1

## 2020-05-12 MED ORDER — INSULIN DETEMIR 100 UNIT/ML ~~LOC~~ SOLN
20.0000 [IU] | Freq: Two times a day (BID) | SUBCUTANEOUS | Status: DC
  Administered 2020-05-12 – 2020-05-15 (×6): 20 [IU] via SUBCUTANEOUS
  Filled 2020-05-12 (×7): qty 0.2

## 2020-05-12 NOTE — Progress Notes (Signed)
CH assisted RN in bringing pt.'s keys, phone to pt.'s mother Collin Brown in [facility]; mother expressed strong conviction that God would heal pt. and he would come home w/her.  Mthr. shared she and pt. live together and that pt. has been helping take of her as she deals with her own health issues; she shared he is a very giving person and that many friends and family are concerned about him.  Per mthr.'s request, CH prayed for pt. and mother; CH assisted mother in making brief visit today after seeking permission from RN/unit director.  Mthr. very grateful for [facility] support, prayer, assistance.  CH remains available as needed.

## 2020-05-12 NOTE — Progress Notes (Signed)
Patient repositioned back to supine from prone position, no signs of distress noted during repositioning.

## 2020-05-12 NOTE — Progress Notes (Signed)
Patient successfully proned at 0030 on 05/12/20.

## 2020-05-12 NOTE — Progress Notes (Signed)
CRITICAL CARE PROGRESS NOTE    Name: Collin Brown MRN: [id number] DOB: 12-20-1985     LOS: 9   SUBJECTIVE FINDINGS & SIGNIFICANT EVENTS    Patient description:  34 yo with OSA, morbid obesity BMI>56 came in with flu like illness diagnosed with COVID19 pneumonia was initially ion Medical floor with hospitalist service initiated Remdesevir and barictitinib, he progressively became more dyspneic he was placed on BIPAP and did not tolerate then placed on HFNC did not tolerate patient himself consented to MV and was intubated in MICU. Mother was notified who is sick at home with Collin Brown.   Events:  9/27- patient on 65%FiO2 with proning protocol. CVP trending, goal negative fluid balance.   05/08/20- patient with worsening ventilation increased FiO2 to 100% overnight and had episode of severe bradycardia.  Concern for mucus plugging will perform recruitment maneuvers and possible bronchoscopy.  We were able to perform tracheal aspirate and have weaned down fIO2 to 75% 05/09/20- patient had bradycardic episode and he desaturated overnight with temporary increase to 100FiO2.  Lasix q12 going. I spoke to brother today for update.  His tracheal aspirate came back normal flora so we will stop antibiotic.  05/10/20-patient with pneumomediastinum and on maximal setting on ventilator. Poor prognosis very unfortunate young man. I called and updated Mother today Collin Brown.  05/11/20- patient weaned to 80%. Will continue with weaning and proning protocol  05/12/20- patient remains critically ill.       Lines/tubes  9/25 - Left IJ placed  : Airway 8 mm (Active)  Secured at (cm) 26 cm 05/07/20 0801  Measured From Lips 05/07/20 Lozano 05/07/20 0801  Secured By [employer] 05/07/20 0801  Tube  Holder Repositioned Yes 05/07/20 0801  Cuff Pressure (cm H2O) 26 cm H2O 05/07/20 0801  Site Condition Dry 05/07/20 0801     CVC Triple Lumen 05/05/20 Left Internal jugular (Active)  Indication for Insertion or Continuance of Line Vasoactive infusions;Poor Vasculature-patient has had multiple peripheral attempts or PIVs lasting less than 24 hours 05/06/20 2130  Site Assessment Clean;Dry;Intact 05/06/20 2130  Proximal Lumen Status Infusing 05/06/20 2130  Medial Lumen Status Infusing 05/06/20 2130  Distal Lumen Status Infusing 05/06/20 2130  Dressing Type Transparent;Occlusive 05/06/20 2130  Dressing Status Clean;Dry;Intact 05/06/20 2130  Antimicrobial disc in place? Yes 05/06/20 2130  Line Care Connections checked and tightened 05/06/20 2130  Dressing Intervention Dressing reinforced 05/06/20 0900  Dressing Change Due 05/12/20 05/06/20 2130     NG/OG Tube Orogastric Center mouth Xray 65 cm (Active)  Cm Marking at Nare/Corner of Mouth (if applicable) 65 cm 73/22/02 2130  Site Assessment Clean;Dry;Intact 05/06/20 2130  Ongoing Placement Verification No change in cm markings or external length of tube from initial placement;No change in respiratory status;No acute changes, not attributed to clinical condition;Xray 05/06/20 2130  Status Clamped 05/06/20 2130     Urethral Catheter Donnald Garre, RN 16 Fr. (Active)  Indication for Insertion or Continuance of Catheter Unstable critically ill patients first 24-48 hours (See Criteria) 05/06/20 2000  Site Assessment Clean;Intact;Prolapse;Swelling 05/06/20 2000  Catheter Maintenance Bag below level of bladder;Catheter secured;Drainage bag/tubing not touching floor;Insertion date on drainage bag;No dependent loops;Seal intact 05/06/20 2000  Collection Container Standard drainage bag 05/06/20 2000  Securement Method Securing device (Describe) 05/06/20 2000  Urinary Catheter Interventions (if applicable) Unclamped 54/27/06 2000  Output (mL) 100 mL 05/07/20  0445    Microbiology/Sepsis markers: Results for orders placed or performed during the hospital encounter  of 05/03/20  Blood Culture (routine x 2)     Status: None   Collection Time: 05/03/20  4:33 PM   Specimen: BLOOD  Result Value Ref Range Status   Specimen Description BLOOD BLOOD LEFT HAND  Final   Special Requests   Final    BOTTLES DRAWN AEROBIC AND ANAEROBIC Blood Culture results may not be optimal due to an inadequate volume of blood received in culture bottles   Culture   Final    NO GROWTH 5 DAYS Performed at [facility], [address] [place], [place] [address]    Report Status 05/08/2020 FINAL  Final  Blood Culture (routine x 2)     Status: None   Collection Time: 05/03/20  4:33 PM   Specimen: BLOOD  Result Value Ref Range Status   Specimen Description BLOOD BLOOD RIGHT HAND  Final   Special Requests   Final    BOTTLES DRAWN AEROBIC AND ANAEROBIC Blood Culture adequate volume   Culture   Final    NO GROWTH 5 DAYS Performed at [facility], [address] [place], [place] [address]    Report Status 05/08/2020 FINAL  Final  Respiratory Panel by RT PCR (Flu A&B, Covid) -     Status: Abnormal   Collection Time: 05/03/20  7:32 PM  Result Value Ref Range Status   SARS Coronavirus 2 by RT PCR POSITIVE (A) NEGATIVE Final    Comment: RESULT CALLED TO, READ BACK BY AND VERIFIED WITH: Leonides Schanz RN 2114 05/03/20 HNM/AR    Influenza A by PCR NEGATIVE NEGATIVE Final   Influenza B by PCR NEGATIVE NEGATIVE Final    Comment: Performed at [facility], [address] [place], [place] [address]  MRSA PCR Screening     Status: None   Collection Time: 05/06/20 11:51 AM   Specimen: Nasal Mucosa; Nasopharyngeal  Result Value Ref Range Status   MRSA by PCR NEGATIVE NEGATIVE Final    Comment:        The GeneXpert MRSA Assay (FDA approved for NASAL specimens only), is one component of a comprehensive MRSA colonization surveillance program. It  is not intended to diagnose MRSA infection nor to guide or monitor treatment for MRSA infections. Performed at [facility], [address] [place], [place] [address]   Culture, respiratory (non-expectorated)     Status: None   Collection Time: 05/07/20 10:55 AM   Specimen: Tracheal Aspirate; Respiratory  Result Value Ref Range Status   Specimen Description   Final    TRACHEAL ASPIRATE Performed at [facility], [address] [place], [place] [address]    Special Requests   Final    NONE Performed at [facility], [place], [place] [address]    Gram Stain   Final    RARE WBC PRESENT,BOTH PMN AND MONONUCLEAR NO ORGANISMS SEEN    Culture   Final    RARE Normal respiratory flora-no Staph aureus or Pseudomonas seen Performed at [facility] [address], [place], [place] [address]    Report Status 05/09/2020 FINAL  Final  CULTURE, BLOOD (ROUTINE X 2) w Reflex to ID Panel     Status: None (Preliminary result)   Collection Time: 05/10/20 12:47 PM   Specimen: BLOOD  Result Value Ref Range Status   Specimen Description BLOOD BLOOD RIGHT HAND  Final   Special Requests   Final    BOTTLES DRAWN AEROBIC AND ANAEROBIC Blood Culture adequate volume  Culture   Final    NO GROWTH 2 DAYS Performed at [facility], [address] [place], [place] [address]    Report Status PENDING  Incomplete  CULTURE, BLOOD (ROUTINE X 2) w Reflex to ID Panel     Status: None (Preliminary result)   Collection Time: 05/10/20  1:21 PM   Specimen: BLOOD  Result Value Ref Range Status   Specimen Description BLOOD BLOOD RIGHT HAND  Final   Special Requests   Final    BOTTLES DRAWN AEROBIC AND ANAEROBIC Blood Culture adequate volume   Culture   Final    NO GROWTH 2 DAYS Performed at [facility], [address] [place], [place] [address]    Report Status PENDING  Incomplete    Anti-infectives:  Anti-infectives (From  admission, onward)   Start     Dose/Rate Route Frequency Ordered Stop   05/07/20 1200  meropenem (MERREM) 1 g in sodium chloride 0.9 % 100 mL IVPB  Status:  Discontinued        1 g 200 mL/hr over 30 Minutes Intravenous Every 8 hours 05/07/20 1020 05/09/20 1019   05/06/20 1200  vancomycin (VANCOCIN) IVPB 1000 mg/200 mL premix  Status:  Discontinued        1,000 mg 200 mL/hr over 60 Minutes Intravenous Every 8 hours 05/05/20 2330 05/07/20 1020   05/06/20 0200  vancomycin (VANCOREADY) IVPB 500 mg/100 mL       "Followed by" Linked Group Details   500 mg 100 mL/hr over 60 Minutes Intravenous  Once 05/05/20 2330 05/06/20 0454   05/06/20 0000  vancomycin (VANCOREADY) IVPB 2000 mg/400 mL       "Followed by" Linked Group Details   2,000 mg 200 mL/hr over 120 Minutes Intravenous  Once 05/05/20 2330 05/06/20 0304   05/05/20 2315  cefTRIAXone (ROCEPHIN) 2 g in sodium chloride 0.9 % 100 mL IVPB  Status:  Discontinued        2 g 200 mL/hr over 30 Minutes Intravenous Daily at 10 pm 05/05/20 2307 05/07/20 1020   05/04/20 1000  remdesivir 100 mg in sodium chloride 0.9 % 100 mL IVPB       "Followed by" Linked Group Details   100 mg 200 mL/hr over 30 Minutes Intravenous Daily 05/03/20 1718 05/07/20 0941   05/03/20 1800  remdesivir 200 mg in sodium chloride 0.9% 250 mL IVPB       "Followed by" Linked Group Details   200 mg 580 mL/hr over 30 Minutes Intravenous Once 05/03/20 1718 05/03/20 1941       Consults: Treatment Team:  Pccm, Ander Gaster, MD     PAST MEDICAL HISTORY   Past Medical History:  Diagnosis Date  . Hearing loss in right ear   . Sleep apnea      SURGICAL HISTORY   Past Surgical History:  Procedure Laterality Date  . TONSILLECTOMY       FAMILY HISTORY   Family History  Problem Relation Age of Onset  . Diabetes Mellitus II Mother   . Hypertension Mother   . Diabetes Mellitus II Father   . High blood pressure Father      SOCIAL HISTORY   Social History    Tobacco Use  . Smoking status: Never Smoker  . Smokeless tobacco: Never Used  Vaping Use  . Vaping Use: Never used  Substance Use Topics  . Alcohol use: No  . Drug use: No     MEDICATIONS   Current Medication:  Current  Facility-Administered Medications:  .  acetaminophen (TYLENOL) tablet 1,000 mg, 1,000 mg, Oral, Q6H PRN, Sharion Settler, NP, 1,000 mg at 05/12/20 0100 .  ascorbic acid (VITAMIN C) tablet 500 mg, 500 mg, Per Tube, Daily, Awilda Bill, NP, 500 mg at 05/12/20 0932 .  aspirin chewable tablet 81 mg, 81 mg, Per Tube, Daily, Awilda Bill, NP, 81 mg at 05/12/20 0931 .  chlorhexidine gluconate (MEDLINE KIT) (PERIDEX) 0.12 % solution 15 mL, 15 mL, Mouth Rinse, BID, Tyler Pita, MD, 15 mL at 05/12/20 0727 .  Chlorhexidine Gluconate Cloth 2 % PADS 6 each, 6 each, Topical, Daily, Tyler Pita, MD, 6 each at 05/12/20 0932 .  dexamethasone (DECADRON) injection 6 mg, 6 mg, Intravenous, Q24H, Lanney Gins, Zacaria Pousson, MD, 6 mg at 05/12/20 0935 .  dextrose 50 % solution 0-50 mL, 0-50 mL, Intravenous, PRN, Lanney Gins, Joslyne Marshburn, MD .  docusate (COLACE) 50 MG/5ML liquid 100 mg, 100 mg, Per Tube, BID, Awilda Bill, NP, 100 mg at 05/10/20 2118 .  enoxaparin (LOVENOX) injection 40 mg, 40 mg, Subcutaneous, BID, Florina Ou V, MD, 40 mg at 05/12/20 0932 .  famotidine (PEPCID) IVPB 20 mg premix, 20 mg, Intravenous, Q12H, Tyler Pita, MD, Last Rate: 100 mL/hr at 05/12/20 0931, 20 mg at 05/12/20 0931 .  feeding supplement (PROSource TF) liquid 90 mL, 90 mL, Per Tube, TID, Dianna Deshler, MD, 90 mL at 05/12/20 0932 .  feeding supplement (VITAL 1.5 CAL) liquid 1,000 mL, 1,000 mL, Per Tube, Continuous, Cristian Grieves, MD, Last Rate: 65 mL/hr at 05/12/20 0324, 1,000 mL at 05/12/20 0324 .  fentaNYL (SUBLIMAZE) bolus via infusion 50 mcg, 50 mcg, Intravenous, Q15 min PRN, Awilda Bill, NP .  fentaNYL (SUBLIMAZE) injection 50 mcg, 50 mcg, Intravenous, Once, Blakeney, Dana G,  NP .  fentaNYL 2583mg in NS 2518m(1072mml) infusion-PREMIX, 0-400 mcg/hr, Intravenous, Continuous, ChaBenita GutterPH, Last Rate: 40 mL/hr at 05/12/20 0928, 400 mcg/hr at 05/12/20 0928 .  free water 300 mL, 300 mL, Per Tube, Q4H, BlaAwilda BillP, 300 mL at 05/12/20 1200 .  furosemide (LASIX) injection 80 mg, 80 mg, Intravenous, BID, AleLanney Ginsuad, MD, 80 mg at 05/12/20 0718 .  insulin regular, human (MYXREDLIN) 100 units/ 100 mL infusion, , Intravenous, Continuous, Diane Mochizuki, MD, Last Rate: 13 mL/hr at 05/12/20 1247, 13 Units/hr at 05/12/20 1247 .  iohexol (OMNIPAQUE) 350 MG/ML injection 100 mL, 100 mL, Intravenous, Once PRN, BlaAwilda BillP .  MEDLINE mouth rinse, 15 mL, Mouth Rinse, 10 times per day, GonTyler PitaD, 15 mL at 05/12/20 1200 .  midazolam (VERSED) 50 mg in dextrose 5 % 50 mL (1 mg/mL) infusion, 0.5-10 mg/hr, Intravenous, Continuous, ChaBenita GutterPH, Last Rate: 10 mL/hr at 05/12/20 1245, 10 mg/hr at 05/12/20 1245 .  multivitamin with minerals tablet 1 tablet, 1 tablet, Per Tube, Daily, AleOttie GlazierD, 1 tablet at 05/12/20 0932 .  norepinephrine (LEVOPHED) 16 mg in 250m42memix infusion, 0-40 mcg/min, Intravenous, Titrated, BlakAwilda Bill, Stopped at 05/10/20 0757 .  polyethylene glycol (MIRALAX / GLYCOLAX) packet 17 g, 17 g, Per Tube, Daily, BlakAwilda Bill, 17 g at 05/09/20 0928 .  propofol (DIPRIVAN) 1000 MG/100ML infusion, 5-80 mcg/kg/min, Intravenous, Titrated, BlakAwilda Bill, Stopped at 05/12/20 0016 .  vasopressin (PITRESSIN) 20 Units in sodium chloride 0.9 % 100 mL infusion-*FOR SHOCK*, 0-0.04 Units/min, Intravenous, Continuous, Blakeney, Dana G, NP .  vecuronium (NORCURON) injection 10 mg, 10 mg, Intravenous, Q1H  PRN, Awilda Bill, NP, 10 mg at 05/12/20 0033 .  zinc sulfate capsule 220 mg, 220 mg, Per Tube, Daily, Awilda Bill, NP, 220 mg at 05/12/20 0932    ALLERGIES   Patient has no known  allergies.    REVIEW OF SYSTEMS    10 point ROS unable to perform due to sedation on MV  PHYSICAL EXAMINATION   Vital Signs: Temp:  [100.1 F (37.8 C)-102.8 F (39.3 C)] 101 F (38.3 C) (10/02 1200) Pulse Rate:  [108-145] 108 (10/02 0400) Resp:  [13-24] 15 (10/02 0400) BP: (110-144)/(49-79) 110/66 (10/02 0400) SpO2:  [86 %-95 %] 91 % (10/02 0910) FiO2 (%):  [75 %-100 %] 85 % (10/02 1200) Weight:  [170.4 kg] 170.4 kg (10/02 0312)  GENERAL:NAD HEAD: Normocephalic, atraumatic.  EYES: Pupils equal, round, reactive to light.  No scleral icterus.  MOUTH: Moist mucosal membrane. NECK: Supple. No thyromegaly. No nodules. No JVD.  PULMONARY: rhonchi bilaterally  CARDIOVASCULAR: S1 and S2. Regular rate and rhythm. No murmurs, rubs, or gallops.  GASTROINTESTINAL: Soft, nontender, non-distended. No masses. Positive bowel sounds. No hepatosplenomegaly.  MUSCULOSKELETAL: No swelling, clubbing, or edema.  NEUROLOGIC: Mild distress due to acute illness SKIN:intact,warm,dry   PERTINENT DATA     Infusions: . famotidine (PEPCID) IV 20 mg (05/12/20 0931)  . feeding supplement (VITAL 1.5 CAL) 1,000 mL (05/12/20 0324)  . fentaNYL infusion INTRAVENOUS 400 mcg/hr (05/12/20 0928)  . insulin 13 Units/hr (05/12/20 1247)  . midazolam 10 mg/hr (05/12/20 1245)  . norepinephrine (LEVOPHED) Adult infusion Stopped (05/10/20 0757)  . propofol (DIPRIVAN) infusion Stopped (05/12/20 0016)  . vasopressin     Scheduled Medications: . vitamin C  500 mg Per Tube Daily  . aspirin  81 mg Per Tube Daily  . chlorhexidine gluconate (MEDLINE KIT)  15 mL Mouth Rinse BID  . Chlorhexidine Gluconate Cloth  6 each Topical Daily  . dexamethasone (DECADRON) injection  6 mg Intravenous Q24H  . docusate  100 mg Per Tube BID  . enoxaparin (LOVENOX) injection  40 mg Subcutaneous BID  . feeding supplement (PROSource TF)  90 mL Per Tube TID  . fentaNYL (SUBLIMAZE) injection  50 mcg Intravenous Once  . free water   300 mL Per Tube Q4H  . furosemide  80 mg Intravenous BID  . mouth rinse  15 mL Mouth Rinse 10 times per day  . multivitamin with minerals  1 tablet Per Tube Daily  . polyethylene glycol  17 g Per Tube Daily  . zinc sulfate  220 mg Per Tube Daily   PRN Medications: acetaminophen, dextrose, fentaNYL, iohexol, vecuronium Hemodynamic parameters: CVP:  [5 mmHg-18 mmHg] 18 mmHg Intake/Output: 10/01 0701 - 10/02 0700 In: 4463.3 [I.V.:1417.1; NG/GT:2856.2; IV Piggyback:100] Out: 7591 [Urine:4975]  Ventilator  Settings: Vent Mode: PRVC FiO2 (%):  [75 %-100 %] 85 % Set Rate:  [35 bmp] 35 bmp Vt Set:  [460 mL] 460 mL PEEP:  [8 cmH20-12 cmH20] 8 cmH20 Plateau Pressure:  [20 cmH20] 20 cmH20   Other Labs:     LAB RESULTS:  Basic Metabolic Panel: Recent Labs  Lab 05/06/20 0445 05/06/20 0445 05/07/20 0518 05/07/20 0518 05/08/20 0501 05/08/20 1559 05/09/20 0430 05/09/20 1550 05/10/20 0402 05/10/20 1700 05/11/20 0414 05/12/20 0325  NA 141   < > 144   < > 143  --  151*  --  152*  --  153* 152*  K 5.1   < > 4.6   < > 4.6   < > 4.7   < >  4.2   < > 4.3 3.8  CL 102   < > 105   < > 105  --  108  --  105  --  101 101  CO2 29   < > 33*   < > 31  --  37*  --  40*  --  37* 44*  GLUCOSE 361*   < > 224*   < > 437*  --  217*  --  181*  --  158* 132*  BUN 28*   < > 42*   < > 50*  --  50*  --  52*  --  44* 46*  CREATININE 1.19   < > 1.83*   < > 1.30*  --  0.97  --  1.01  --  0.84 1.04  CALCIUM 8.1*   < > 7.7*   < > 7.7*  --  8.2*  --  8.0*  --  8.2* 8.7*  MG 2.8*  --  3.5*  --  3.5*  --   --   --  3.2*  --   --   --   PHOS 3.7   < > 2.6   < > 4.7*  --  5.3*  --  5.7*  --  3.4 2.6   < > = values in this interval not displayed.   Liver Function Tests: Recent Labs  Lab 05/08/20 0501 05/09/20 0430 05/10/20 0402 05/11/20 0414 05/12/20 0325  AST 31 25 43* 49* 134*  ALT 40 37 52* 54* 74*  ALKPHOS 45 44 41 38 41  BILITOT 1.0 0.8 1.0 1.2 1.0  PROT 5.7* 5.6* 5.8* 5.9* 6.0*  ALBUMIN 3.0*  3.1* 3.1* 2.9* 2.8*   No results for input(s): LIPASE, AMYLASE in the last 168 hours. No results for input(s): AMMONIA in the last 168 hours. CBC: Recent Labs  Lab 05/08/20 0501 05/09/20 0430 05/10/20 0402 05/11/20 0414 05/12/20 0325  WBC 6.9 9.0 9.0 7.1 11.5*  NEUTROABS 5.0 7.1 7.0 5.9 9.4*  HGB 11.4* 12.2* 12.5* 11.4* 11.3*  HCT 37.6* 39.4 41.4 39.3 38.6*  MCV 91.0 87.8 91.0 93.1 92.6  PLT 189 186 193 158 195   Cardiac Enzymes: No results for input(s): CKTOTAL, CKMB, CKMBINDEX, TROPONINI in the last 168 hours. BNP: Invalid input(s): POCBNP CBG: Recent Labs  Lab 05/12/20 0726 05/12/20 0932 05/12/20 1126 05/12/20 1242 05/12/20 1331  GLUCAP 165* 174* 189* 177* 170*       IMAGING RESULTS:  Imaging: DG Chest Port 1 View  Result Date: 05/11/2020 CLINICAL DATA:  Acute respiratory failure with hypoxia EXAM: PORTABLE CHEST 1 VIEW COMPARISON:  05/10/2020 FINDINGS: Enteric and endotracheal tubes appear unchanged in position. Left central venous catheter with tip projected over the brachiocephalic vein. Prominent subcutaneous emphysema extending into the base of the neck. No definite pneumothorax or pneumomediastinum. Diffuse bilateral airspace disease throughout the lungs. Heart size appears normal but is obscured by the parenchymal process. No pleural effusions. IMPRESSION: Diffuse bilateral airspace disease throughout the lungs. Prominent subcutaneous emphysema extending into the base of the neck. No change since previous study. Electronically Signed   By: Lucienne Capers M.D.   On: 05/11/2020 05:42   ECHOCARDIOGRAM COMPLETE  Result Date: 05/11/2020    ECHOCARDIOGRAM REPORT   Patient Name:   SHAHRUKH PASCH Date of Exam: 05/11/2020 Medical Rec #:  [id number]       Height:       70.0 in Accession #:    [id number]  Weight:       403.2 lb Date of Birth:  04/06/1986       BSA:          2.812 m Patient Age:    34 years        BP:           129/61 mmHg Patient Gender: M                HR:           120 bpm. Exam Location:  [facility] Procedure: 2D Echo, Cardiac Doppler and Color Doppler Indications:     Abnormal ECG 794.31 / R94.31  History:         Patient has no prior history of Echocardiogram examinations.  Sonographer:     Alyse Low Roar Referring Phys:  [id number] Awilda Bill Diagnosing Phys: Ida Rogue MD IMPRESSIONS  1. Left ventricular ejection fraction, by estimation, is 60 to 65%. The left ventricle has normal function. The left ventricle has no regional wall motion abnormalities. Left ventricular diastolic parameters are consistent with Grade I diastolic dysfunction (impaired relaxation).  2. Right ventricular systolic function is normal. The right ventricular size is normal.  3. Challenging images FINDINGS  Left Ventricle: Left ventricular ejection fraction, by estimation, is 60 to 65%. The left ventricle has normal function. The left ventricle has no regional wall motion abnormalities. The left ventricular internal cavity size was normal in size. There is  no left ventricular hypertrophy. Left ventricular diastolic parameters are consistent with Grade I diastolic dysfunction (impaired relaxation). Right Ventricle: The right ventricular size is normal. No increase in right ventricular wall thickness. Right ventricular systolic function is normal. Left Atrium: Left atrial size was normal in size. Right Atrium: Right atrial size was normal in size. Pericardium: There is no evidence of pericardial effusion. Mitral Valve: The mitral valve is normal in structure. No evidence of mitral valve regurgitation. No evidence of mitral valve stenosis. Tricuspid Valve: The tricuspid valve is normal in structure. Tricuspid valve regurgitation is not demonstrated. No evidence of tricuspid stenosis. Aortic Valve: The aortic valve was not well visualized. Aortic valve regurgitation is not visualized. No aortic stenosis is present. Aortic valve peak gradient measures 15.5 mmHg. Pulmonic Valve: The  pulmonic valve was normal in structure. Pulmonic valve regurgitation is not visualized. No evidence of pulmonic stenosis. Aorta: The aortic root is normal in size and structure. Venous: The inferior vena cava is normal in size with greater than 50% respiratory variability, suggesting right atrial pressure of 3 mmHg. IAS/Shunts: No atrial level shunt detected by color flow Doppler.  LEFT VENTRICLE PLAX 2D LVIDd:         4.38 cm  Diastology LVIDs:         3.29 cm  LV e' medial:    6.53 cm/s LV PW:         1.26 cm  LV E/e' medial:  16.1 LV IVS:        1.19 cm  LV e' lateral:   17.20 cm/s LVOT diam:     2.00 cm  LV E/e' lateral: 6.1 LVOT Area:     3.14 cm  RIGHT VENTRICLE RV Mid diam:    2.96 cm RV S prime:     30.40 cm/s TAPSE (M-mode): 2.0 cm LEFT ATRIUM             Index      RIGHT ATRIUM           Index LA  diam:        3.70 cm 1.32 cm/m RA Area:     10.40 cm LA Vol (A2C):   25.7 ml 9.14 ml/m RA Volume:   20.50 ml  7.29 ml/m LA Vol (A4C):   23.7 ml 8.43 ml/m LA Biplane Vol: 24.8 ml 8.82 ml/m  AORTIC VALVE                PULMONIC VALVE AV Area (Vmax): 2.31 cm    PV Vmax:        1.66 m/s AV Vmax:        197.00 cm/s PV Peak grad:   11.0 mmHg AV Peak Grad:   15.5 mmHg   RVOT Peak grad: 7 mmHg LVOT Vmax:      145.00 cm/s  AORTA Ao Root diam: 2.70 cm MITRAL VALVE MV Area (PHT): 5.42 cm     SHUNTS MV Decel Time: 140 msec     Systemic Diam: 2.00 cm MV E velocity: 105.00 cm/s MV A velocity: 134.00 cm/s MV E/A ratio:  0.78 MV A Prime:    13.2 cm/s Ida Rogue MD Electronically signed by Ida Rogue MD Signature Date/Time: 05/11/2020/6:05:34 PM    Final    '@PROBHOSP' @ ECHOCARDIOGRAM COMPLETE  Result Date: 05/11/2020    ECHOCARDIOGRAM REPORT   Patient Name:   NISSIM FLEISCHER Date of Exam: 05/11/2020 Medical Rec #:  [id number]       Height:       70.0 in Accession #:    [id number]      Weight:       403.2 lb Date of Birth:  04-Jul-1986       BSA:          2.812 m Patient Age:    34 years        BP:           129/61  mmHg Patient Gender: M               HR:           120 bpm. Exam Location:  [facility] Procedure: 2D Echo, Cardiac Doppler and Color Doppler Indications:     Abnormal ECG 794.31 / R94.31  History:         Patient has no prior history of Echocardiogram examinations.  Sonographer:     Alyse Low Roar Referring Phys:  [id number] Awilda Bill Diagnosing Phys: Ida Rogue MD IMPRESSIONS  1. Left ventricular ejection fraction, by estimation, is 60 to 65%. The left ventricle has normal function. The left ventricle has no regional wall motion abnormalities. Left ventricular diastolic parameters are consistent with Grade I diastolic dysfunction (impaired relaxation).  2. Right ventricular systolic function is normal. The right ventricular size is normal.  3. Challenging images FINDINGS  Left Ventricle: Left ventricular ejection fraction, by estimation, is 60 to 65%. The left ventricle has normal function. The left ventricle has no regional wall motion abnormalities. The left ventricular internal cavity size was normal in size. There is  no left ventricular hypertrophy. Left ventricular diastolic parameters are consistent with Grade I diastolic dysfunction (impaired relaxation). Right Ventricle: The right ventricular size is normal. No increase in right ventricular wall thickness. Right ventricular systolic function is normal. Left Atrium: Left atrial size was normal in size. Right Atrium: Right atrial size was normal in size. Pericardium: There is no evidence of pericardial effusion. Mitral Valve: The mitral valve is normal in structure. No evidence of mitral valve regurgitation. No evidence of mitral valve  stenosis. Tricuspid Valve: The tricuspid valve is normal in structure. Tricuspid valve regurgitation is not demonstrated. No evidence of tricuspid stenosis. Aortic Valve: The aortic valve was not well visualized. Aortic valve regurgitation is not visualized. No aortic stenosis is present. Aortic valve peak gradient measures  15.5 mmHg. Pulmonic Valve: The pulmonic valve was normal in structure. Pulmonic valve regurgitation is not visualized. No evidence of pulmonic stenosis. Aorta: The aortic root is normal in size and structure. Venous: The inferior vena cava is normal in size with greater than 50% respiratory variability, suggesting right atrial pressure of 3 mmHg. IAS/Shunts: No atrial level shunt detected by color flow Doppler.  LEFT VENTRICLE PLAX 2D LVIDd:         4.38 cm  Diastology LVIDs:         3.29 cm  LV e' medial:    6.53 cm/s LV PW:         1.26 cm  LV E/e' medial:  16.1 LV IVS:        1.19 cm  LV e' lateral:   17.20 cm/s LVOT diam:     2.00 cm  LV E/e' lateral: 6.1 LVOT Area:     3.14 cm  RIGHT VENTRICLE RV Mid diam:    2.96 cm RV S prime:     30.40 cm/s TAPSE (M-mode): 2.0 cm LEFT ATRIUM             Index      RIGHT ATRIUM           Index LA diam:        3.70 cm 1.32 cm/m RA Area:     10.40 cm LA Vol (A2C):   25.7 ml 9.14 ml/m RA Volume:   20.50 ml  7.29 ml/m LA Vol (A4C):   23.7 ml 8.43 ml/m LA Biplane Vol: 24.8 ml 8.82 ml/m  AORTIC VALVE                PULMONIC VALVE AV Area (Vmax): 2.31 cm    PV Vmax:        1.66 m/s AV Vmax:        197.00 cm/s PV Peak grad:   11.0 mmHg AV Peak Grad:   15.5 mmHg   RVOT Peak grad: 7 mmHg LVOT Vmax:      145.00 cm/s  AORTA Ao Root diam: 2.70 cm MITRAL VALVE MV Area (PHT): 5.42 cm     SHUNTS MV Decel Time: 140 msec     Systemic Diam: 2.00 cm MV E velocity: 105.00 cm/s MV A velocity: 134.00 cm/s MV E/A ratio:  0.78 MV A Prime:    13.2 cm/s Ida Rogue MD Electronically signed by Ida Rogue MD Signature Date/Time: 05/11/2020/6:05:34 PM    Final        ASSESSMENT AND PLAN    -Multidisciplinary rounds held today  Acute hypoxic respiratory failure due to COVID-19 ARDS COVID-19 pneumonia Underlying morbid obesity Mechanical intubation  Full vent support for now-vent settings reviewed and established  Mechanical ventilation via ARDS protocol, target PRVC 6  cc/kg Goal plateau pressure less than 30, driving pressure less than 15 SBT once all parameters met  VAP bundle implemented  Maintain O2 sats 88% or higher Continue remdesivir Continue steroids switched to Decadron 10 mg twice daily>>6 daily -05/08/20 Discontinue baricitinib due to leukopenia and lack of efficacy Follow inflammatory markers Trend WBC and monitor fever curves  Follow cultures  Continue vancomycin and ceftriaxone started-09/25  -9/27- diurese and keep negative fluid balance.  Wean O2 currently at 65%.  Minimize fluid infusions. Patient febrile will start Tylenol                         -net 1550 urine out overnight  05/09/20- patient had desaturation event. Vent changes discussed with RT. Diuresis upgraded to 80lasix bid. Rubinol IV tid for bradycardia and high secretions 05/10/20- patient with extensive subcutaneous emphysema 05/11/20- continue proning and weaning per protocol  05/12/20- continue proning and weaning per protocol - mother came to [facility] last night.   ID -continue IV abx as prescibed -follow up cultures  GI/Nutrition GI PROPHYLAXIS as indicated DIET-->TF's as tolerated Constipation protocol as indicated  ENDO - ICU hypoglycemic\Hyperglycemia protocol -check FSBS per protocol   ELECTROLYTES -follow labs as needed -replace as needed -pharmacy consultation   DVT/GI PRX ordered -SCDs  TRANSFUSIONS AS NEEDED MONITOR FSBS ASSESS the need for LABS as needed   Critical care provider statement:    Critical care time (minutes):  33   Critical care time was exclusive of:  Separately billable procedures and treating other patients   Critical care was necessary to treat or prevent imminent or life-threatening deterioration of the following conditions:  ARDS due to [place] pneumonia   Critical care was time spent personally by me on the following activities:  Development of treatment plan with patient or surrogate, discussions with consultants,  evaluation of patient's response to treatment, examination of patient, obtaining history from patient or surrogate, ordering and performing treatments and interventions, ordering and review of laboratory studies and re-evaluation of patient's condition.  I assumed direction of critical care for this patient from another provider in my specialty: no    This document was prepared using Dragon voice recognition software and may include unintentional dictation errors.    Ottie Glazier, M.D.  Division of [facility]

## 2020-05-12 NOTE — Consult Note (Signed)
PHARMACY CONSULT NOTE  Pharmacy Consult for Electrolyte Monitoring and Replacement   Recent Labs: Potassium (mmol/L)  Date Value  05/12/2020 3.8   Magnesium (mg/dL)  Date Value  19/37/9024 3.2 (H)   Calcium (mg/dL)  Date Value  09/73/5329 8.7 (L)   Albumin (g/dL)  Date Value  92/42/6834 2.8 (L)   Phosphorus (mg/dL)  Date Value  19/62/2297 2.6   Sodium (mmol/L)  Date Value  05/12/2020 152 (H)  Corrected Calcium: 8.2  Assessment: Pharmacy has been consulted for electrolyte replacement in this 34 YOM requiring intubation and admission to the CCU for COVID PNA. Treatment including IV decadron, leading to the need for high amounts of insulin and eventually an insulin drip per the diabetes coordinator.  9/28 Electrolytes WNL with mag and phos elevated, possibly due to bump in Scr yesterday which has improved from 1.83>>1.30 and good UOP. Pt is receiving tube feeds and no maintenance fluids at this time.   9/29 Potassium relatively stable despite insulin drip (8.5 units/hr) and Lasix. Dose of Lasix increased today and started on free water flushes for hypernatremia. Will recheck electrolytes tomorrow AM. Doesn't appear to need as frequent monitoring; will address as necessary.  9/30 Na 152, K stable on insulin drip, Na still increased at 152. Free water flushes increased to 300 mL q4hours overnight. Pt also on lasix 80 mg BID. Scr 1.01 and producing good UOP.  10/1 Patient has not required electrolyte replacement despite insulin drip and increased Lasix dose.   Goal of Therapy:  Electrolytes WNL  Plan:  No electrolyte replacement at this time but slight downward trend (on insulin drip and Lasix 80 mg IV BID). Will follow up in the morning.  Athan Casalino A, PharmD 05/12/2020 8:19 AM

## 2020-05-13 DIAGNOSIS — L8990 Pressure ulcer of unspecified site, unspecified stage: Secondary | ICD-10-CM | POA: Insufficient documentation

## 2020-05-13 LAB — CBC WITH DIFFERENTIAL/PLATELET
Abs Immature Granulocytes: 0.26 10*3/uL — ABNORMAL HIGH (ref 0.00–0.07)
Basophils Absolute: 0 10*3/uL (ref 0.0–0.1)
Basophils Relative: 0 %
Eosinophils Absolute: 0.1 10*3/uL (ref 0.0–0.5)
Eosinophils Relative: 1 %
HCT: 41.2 % (ref 39.0–52.0)
Hemoglobin: 12.3 g/dL — ABNORMAL LOW (ref 13.0–17.0)
Immature Granulocytes: 2 %
Lymphocytes Relative: 9 %
Lymphs Abs: 1.3 10*3/uL (ref 0.7–4.0)
MCH: 27.5 pg (ref 26.0–34.0)
MCHC: 29.9 g/dL — ABNORMAL LOW (ref 30.0–36.0)
MCV: 92.2 fL (ref 80.0–100.0)
Monocytes Absolute: 1.7 10*3/uL — ABNORMAL HIGH (ref 0.1–1.0)
Monocytes Relative: 11 %
Neutro Abs: 12.1 10*3/uL — ABNORMAL HIGH (ref 1.7–7.7)
Neutrophils Relative %: 77 %
Platelets: 249 10*3/uL (ref 150–400)
RBC: 4.47 MIL/uL (ref 4.22–5.81)
RDW: 15.9 % — ABNORMAL HIGH (ref 11.5–15.5)
WBC: 15.5 10*3/uL — ABNORMAL HIGH (ref 4.0–10.5)
nRBC: 0 % (ref 0.0–0.2)

## 2020-05-13 LAB — BLOOD GAS, ARTERIAL
Acid-Base Excess: 26.1 mmol/L — ABNORMAL HIGH (ref 0.0–2.0)
Bicarbonate: 56.4 mmol/L — ABNORMAL HIGH (ref 20.0–28.0)
FIO2: 0.7
MECHVT: 460 mL
O2 Saturation: 96.5 %
PEEP: 8 cmH2O
Patient temperature: 37
RATE: 35 resp/min
pCO2 arterial: 91 mmHg (ref 32.0–48.0)
pH, Arterial: 7.4 (ref 7.350–7.450)
pO2, Arterial: 86 mmHg (ref 83.0–108.0)

## 2020-05-13 LAB — BASIC METABOLIC PANEL
Anion gap: 13 (ref 5–15)
BUN: 47 mg/dL — ABNORMAL HIGH (ref 6–20)
CO2: 41 mmol/L — ABNORMAL HIGH (ref 22–32)
Calcium: 8.5 mg/dL — ABNORMAL LOW (ref 8.9–10.3)
Chloride: 99 mmol/L (ref 98–111)
Creatinine, Ser: 1.06 mg/dL (ref 0.61–1.24)
GFR calc Af Amer: >60 mL/min (ref >60)
GFR calc non Af Amer: >60 mL/min (ref >60)
Glucose, Bld: 232 mg/dL — ABNORMAL HIGH (ref 70–99)
Potassium: 4.2 mmol/L (ref 3.5–5.1)
Sodium: 153 mmol/L — ABNORMAL HIGH (ref 135–145)

## 2020-05-13 LAB — GLUCOSE, CAPILLARY
Glucose-Capillary: 190 mg/dL — ABNORMAL HIGH (ref 70–99)
Glucose-Capillary: 278 mg/dL — ABNORMAL HIGH (ref 70–99)
Glucose-Capillary: 269 mg/dL — ABNORMAL HIGH (ref 70–99)
Glucose-Capillary: 274 mg/dL — ABNORMAL HIGH (ref 70–99)
Glucose-Capillary: 360 mg/dL — ABNORMAL HIGH (ref 70–99)
Glucose-Capillary: 250 mg/dL — ABNORMAL HIGH (ref 70–99)
Glucose-Capillary: 202 mg/dL — ABNORMAL HIGH (ref 70–99)

## 2020-05-13 LAB — C-REACTIVE PROTEIN: CRP: 6.5 mg/dL — ABNORMAL HIGH (ref <1.0)

## 2020-05-13 LAB — PHOSPHORUS: Phosphorus: 4.5 mg/dL (ref 2.5–4.6)

## 2020-05-13 LAB — FERRITIN: Ferritin: 1091 ng/mL — ABNORMAL HIGH (ref 24–336)

## 2020-05-13 LAB — FIBRIN DERIVATIVES D-DIMER (ARMC ONLY): Fibrin derivatives D-dimer (ARMC): 2182.29 ng/mL (FEU) — ABNORMAL HIGH (ref 0.00–499.00)

## 2020-05-13 LAB — MAGNESIUM: Magnesium: 2.9 mg/dL — ABNORMAL HIGH (ref 1.7–2.4)

## 2020-05-13 MED ORDER — IBUPROFEN 100 MG/5ML PO SUSP
600.0000 mg | Freq: Three times a day (TID) | ORAL | Status: DC | PRN
  Administered 2020-05-13 – 2020-05-19 (×9): 600 mg via NASOGASTRIC
  Filled 2020-05-13 (×13): qty 30

## 2020-05-13 NOTE — Progress Notes (Addendum)
CRITICAL CARE PROGRESS NOTE    Name: Collin Brown MRN: [id number] DOB: [birth year]     LOS: 72   SUBJECTIVE FINDINGS & SIGNIFICANT EVENTS    Patient description:  34 yo with OSA, morbid obesity BMI>56 came in with flu like illness diagnosed with COVID19 pneumonia was initially ion Medical floor with hospitalist service initiated Remdesevir and barictitinib, he progressively became more dyspneic he was placed on BIPAP and did not tolerate then placed on HFNC did not tolerate patient himself consented to MV and was intubated in MICU. Mother was notified who is sick at home with [place].   Events:  9/27- patient on 65%FiO2 with proning protocol. CVP trending, goal negative fluid balance.   05/08/20- patient with worsening ventilation increased FiO2 to 100% overnight and had episode of severe bradycardia.  Concern for mucus plugging will perform recruitment maneuvers and possible bronchoscopy.  We were able to perform tracheal aspirate and have weaned down fIO2 to 75% 05/09/20- patient had bradycardic episode and he desaturated overnight with temporary increase to 100FiO2.  Lasix q12 going. I spoke to brother today for update.  His tracheal aspirate came back normal flora so we will stop antibiotic.  05/10/20-patient with pneumomediastinum and on maximal setting on ventilator. Poor prognosis very unfortunate young man. I called and updated Mother today [name].  05/11/20- patient weaned to 80%. Will continue with weaning and proning protocol  05/12/20- patient remains critically ill.  Mother at bedside we discussed his care plan, she is with strong faith and continues to pray for her son to improve.  05/13/20- patient remains crtically ill.  Proning per protocol. No significant events overnight.      Lines/tubes  9/25  - Left IJ placed  : Airway 8 mm (Active)  Secured at (cm) 26 cm 05/07/20 0801  Measured From Lips 05/07/20 [place] 05/07/20 0801  Secured By [employer] 05/07/20 0801  Tube Holder Repositioned Yes 05/07/20 0801  Cuff Pressure (cm H2O) 26 cm H2O 05/07/20 0801  Site Condition Dry 05/07/20 0801     CVC Triple Lumen 05/05/20 Left Internal jugular (Active)  Indication for Insertion or Continuance of Line Vasoactive infusions;Poor Vasculature-patient has had multiple peripheral attempts or PIVs lasting less than 24 hours 05/06/20 2130  Site Assessment Clean;Dry;Intact 05/06/20 2130  Proximal Lumen Status Infusing 05/06/20 2130  Medial Lumen Status Infusing 05/06/20 2130  Distal Lumen Status Infusing 05/06/20 2130  Dressing Type Transparent;Occlusive 05/06/20 2130  Dressing Status Clean;Dry;Intact 05/06/20 2130  Antimicrobial disc in place? Yes 05/06/20 2130  Line Care Connections checked and tightened 05/06/20 2130  Dressing Intervention Dressing reinforced 05/06/20 0900  Dressing Change Due 05/12/20 05/06/20 2130     NG/OG Tube Orogastric Center mouth Xray 65 cm (Active)  Cm Marking at Nare/Corner of Mouth (if applicable) 65 cm 79/02/40 2130  Site Assessment Clean;Dry;Intact 05/06/20 2130  Ongoing Placement Verification No change in cm markings or external length of tube from initial placement;No change in respiratory status;No acute changes, not attributed to clinical condition;Xray 05/06/20 2130  Status Clamped 05/06/20 2130     Urethral Catheter Donnald Garre, RN 16 Fr. (Active)  Indication for Insertion or Continuance of Catheter Unstable critically ill patients first 24-48 hours (See Criteria) 05/06/20 2000  Site Assessment Clean;Intact;Prolapse;Swelling 05/06/20 2000  Catheter Maintenance Bag below level of bladder;Catheter secured;Drainage bag/tubing not touching floor;Insertion date on drainage bag;No dependent loops;Seal intact 05/06/20 2000  Collection  Container Standard drainage bag 05/06/20 2000  Securement Method  Securing device (Describe) 05/06/20 2000  Urinary Catheter Interventions (if applicable) Unclamped 48/88/91 2000  Output (mL) 100 mL 05/07/20 0445    Microbiology/Sepsis markers: Results for orders placed or performed during the hospital encounter of 05/03/20  Blood Culture (routine x 2)     Status: None   Collection Time: 05/03/20  4:33 PM   Specimen: BLOOD  Result Value Ref Range Status   Specimen Description BLOOD BLOOD LEFT HAND  Final   Special Requests   Final    BOTTLES DRAWN AEROBIC AND ANAEROBIC Blood Culture results may not be optimal due to an inadequate volume of blood received in culture bottles   Culture   Final    NO GROWTH 5 DAYS Performed at [facility], [address] [place], [place] [address]    Report Status 05/08/2020 FINAL  Final  Blood Culture (routine x 2)     Status: None   Collection Time: 05/03/20  4:33 PM   Specimen: BLOOD  Result Value Ref Range Status   Specimen Description BLOOD BLOOD RIGHT HAND  Final   Special Requests   Final    BOTTLES DRAWN AEROBIC AND ANAEROBIC Blood Culture adequate volume   Culture   Final    NO GROWTH 5 DAYS Performed at [facility], [address] [place], [place] [address]    Report Status 05/08/2020 FINAL  Final  Respiratory Panel by RT PCR (Flu A&B, Covid) -     Status: Abnormal   Collection Time: 05/03/20  7:32 PM  Result Value Ref Range Status   SARS Coronavirus 2 by RT PCR POSITIVE (A) NEGATIVE Final    Comment: RESULT CALLED TO, READ BACK BY AND VERIFIED WITH: Leonides Schanz RN 2114 05/03/20 HNM/AR    Influenza A by PCR NEGATIVE NEGATIVE Final   Influenza B by PCR NEGATIVE NEGATIVE Final    Comment: Performed at [facility], [address] [place], [place] [address]  MRSA PCR Screening     Status: None   Collection Time: 05/06/20 11:51 AM   Specimen: Nasal Mucosa; Nasopharyngeal  Result Value Ref Range Status    MRSA by PCR NEGATIVE NEGATIVE Final    Comment:        The GeneXpert MRSA Assay (FDA approved for NASAL specimens only), is one component of a comprehensive MRSA colonization surveillance program. It is not intended to diagnose MRSA infection nor to guide or monitor treatment for MRSA infections. Performed at [facility], [address] [place], [place] [address]   Culture, respiratory (non-expectorated)     Status: None   Collection Time: 05/07/20 10:55 AM   Specimen: Tracheal Aspirate; Respiratory  Result Value Ref Range Status   Specimen Description   Final    TRACHEAL ASPIRATE Performed at [facility], [address] [place], [place] [address]    Special Requests   Final    NONE Performed at [facility], [place], [place] [address]    Gram Stain   Final    RARE WBC PRESENT,BOTH PMN AND MONONUCLEAR NO ORGANISMS SEEN    Culture   Final    RARE Normal respiratory flora-no Staph aureus or Pseudomonas seen Performed at [facility] [address], [place],  [address]    Report Status 05/09/2020 FINAL  Final  CULTURE, BLOOD (ROUTINE X 2) w Reflex to ID Panel     Status: None (Preliminary result)   Collection Time: 05/10/20 12:47 PM   Specimen:  BLOOD  Result Value Ref Range Status   Specimen Description BLOOD BLOOD RIGHT HAND  Final   Special Requests   Final    BOTTLES DRAWN AEROBIC AND ANAEROBIC Blood Culture adequate volume   Culture   Final    NO GROWTH 3 DAYS Performed at [facility], [address], [place], [place] [address]    Report Status PENDING  Incomplete  CULTURE, BLOOD (ROUTINE X 2) w Reflex to ID Panel     Status: None (Preliminary result)   Collection Time: 05/10/20  1:21 PM   Specimen: BLOOD  Result Value Ref Range Status   Specimen Description BLOOD BLOOD RIGHT HAND  Final   Special Requests   Final    BOTTLES DRAWN AEROBIC AND ANAEROBIC Blood Culture adequate volume    Culture   Final    NO GROWTH 3 DAYS Performed at [facility], [address] [place], [place] [address]    Report Status PENDING  Incomplete    Anti-infectives:  Anti-infectives (From admission, onward)   Start     Dose/Rate Route Frequency Ordered Stop   05/07/20 1200  meropenem (MERREM) 1 g in sodium chloride 0.9 % 100 mL IVPB  Status:  Discontinued        1 g 200 mL/hr over 30 Minutes Intravenous Every 8 hours 05/07/20 1020 05/09/20 1019   05/06/20 1200  vancomycin (VANCOCIN) IVPB 1000 mg/200 mL premix  Status:  Discontinued        1,000 mg 200 mL/hr over 60 Minutes Intravenous Every 8 hours 05/05/20 2330 05/07/20 1020   05/06/20 0200  vancomycin (VANCOREADY) IVPB 500 mg/100 mL       "Followed by" Linked Group Details   500 mg 100 mL/hr over 60 Minutes Intravenous  Once 05/05/20 2330 05/06/20 0454   05/06/20 0000  vancomycin (VANCOREADY) IVPB 2000 mg/400 mL       "Followed by" Linked Group Details   2,000 mg 200 mL/hr over 120 Minutes Intravenous  Once 05/05/20 2330 05/06/20 0304   05/05/20 2315  cefTRIAXone (ROCEPHIN) 2 g in sodium chloride 0.9 % 100 mL IVPB  Status:  Discontinued        2 g 200 mL/hr over 30 Minutes Intravenous Daily at 10 pm 05/05/20 2307 05/07/20 1020   05/04/20 1000  remdesivir 100 mg in sodium chloride 0.9 % 100 mL IVPB       "Followed by" Linked Group Details   100 mg 200 mL/hr over 30 Minutes Intravenous Daily 05/03/20 1718 05/07/20 0941   05/03/20 1800  remdesivir 200 mg in sodium chloride 0.9% 250 mL IVPB       "Followed by" Linked Group Details   200 mg 580 mL/hr over 30 Minutes Intravenous Once 05/03/20 1718 05/03/20 1941       Consults: Treatment Team:  Pccm, Ander Gaster, MD     PAST MEDICAL HISTORY   Past Medical History:  Diagnosis Date  . Hearing loss in right ear   . Sleep apnea      SURGICAL HISTORY   Past Surgical History:  Procedure Laterality Date  . TONSILLECTOMY       FAMILY HISTORY   Family  History  Problem Relation Age of Onset  . Diabetes Mellitus II Mother   . Hypertension Mother   . Diabetes Mellitus II Father   . High blood pressure Father      SOCIAL HISTORY   Social History   Tobacco Use  . Smoking status: Never Smoker  .  Smokeless tobacco: Never Used  Vaping Use  . Vaping Use: Never used  Substance Use Topics  . Alcohol use: No  . Drug use: No     MEDICATIONS   Current Medication:  Current Facility-Administered Medications:  .  acetaminophen (TYLENOL) tablet 1,000 mg, 1,000 mg, Oral, Q6H PRN, Sharion Settler, NP, 1,000 mg at 05/13/20 0948 .  ascorbic acid (VITAMIN C) tablet 500 mg, 500 mg, Per Tube, Daily, Awilda Bill, NP, 500 mg at 05/13/20 0948 .  aspirin chewable tablet 81 mg, 81 mg, Per Tube, Daily, Awilda Bill, NP, 81 mg at 05/13/20 0948 .  chlorhexidine gluconate (MEDLINE KIT) (PERIDEX) 0.12 % solution 15 mL, 15 mL, Mouth Rinse, BID, Tyler Pita, MD, 15 mL at 05/13/20 1013 .  Chlorhexidine Gluconate Cloth 2 % PADS 6 each, 6 each, Topical, Daily, Tyler Pita, MD, 6 each at 05/13/20 0001 .  dexamethasone (DECADRON) injection 6 mg, 6 mg, Intravenous, Q24H, Nancyjo Givhan, MD, 6 mg at 05/13/20 1003 .  dextrose 50 % solution 0-50 mL, 0-50 mL, Intravenous, PRN, Lanney Gins, Marria Mathison, MD .  docusate (COLACE) 50 MG/5ML liquid 100 mg, 100 mg, Per Tube, BID, Awilda Bill, NP, 100 mg at 05/10/20 2118 .  enoxaparin (LOVENOX) injection 40 mg, 40 mg, Subcutaneous, BID, Florina Ou V, MD, 40 mg at 05/13/20 0959 .  famotidine (PEPCID) IVPB 20 mg premix, 20 mg, Intravenous, Q12H, Tyler Pita, MD, Last Rate: 100 mL/hr at 05/13/20 1200, 20 mg at 05/13/20 1200 .  feeding supplement (PROSource TF) liquid 90 mL, 90 mL, Per Tube, TID, Tayah Idrovo, MD, 90 mL at 05/13/20 0948 .  feeding supplement (VITAL 1.5 CAL) liquid 1,000 mL, 1,000 mL, Per Tube, Continuous, Kadeem Hyle, MD, Last Rate: 65 mL/hr at 05/12/20 2215, 1,000 mL at 05/12/20  2215 .  fentaNYL (SUBLIMAZE) bolus via infusion 50 mcg, 50 mcg, Intravenous, Q15 min PRN, Awilda Bill, NP .  fentaNYL (SUBLIMAZE) injection 50 mcg, 50 mcg, Intravenous, Once, Blakeney, Dreama Saa, NP .  fentaNYL 2562mg in NS 2572m(1037mml) infusion-PREMIX, 0-400 mcg/hr, Intravenous, Continuous, ChaBenita GutterPH, Last Rate: 20 mL/hr at 05/13/20 1004, 200 mcg/hr at 05/13/20 1004 .  free water 300 mL, 300 mL, Per Tube, Q4H, BlaAwilda BillP, 300 mL at 05/13/20 1202 .  furosemide (LASIX) injection 80 mg, 80 mg, Intravenous, BID, AleLanney Ginsuad, MD, 80 mg at 05/13/20 1002 .  ibuprofen (ADVIL) 100 MG/5ML suspension 600 mg, 600 mg, Per NG tube, Q8H PRN, Tukov-Yual, Magdalene S, NP .  insulin aspart (novoLOG) injection 0-20 Units, 0-20 Units, Subcutaneous, Q4H, Tukov-Yual, Magdalene S, NP, 11 Units at 05/13/20 0958 .  insulin detemir (LEVEMIR) injection 20 Units, 20 Units, Subcutaneous, BID, AleOttie GlazierD, 20 Units at 05/13/20 0959 .  iohexol (OMNIPAQUE) 350 MG/ML injection 100 mL, 100 mL, Intravenous, Once PRN, BlaAwilda BillP .  MEDLINE mouth rinse, 15 mL, Mouth Rinse, 10 times per day, GonTyler PitaD, 15 mL at 05/13/20 1202 .  midazolam (VERSED) 50 mg in dextrose 5 % 50 mL (1 mg/mL) infusion, 0.5-15 mg/hr, Intravenous, Continuous, Tukov-Yual, Magdalene S, NP, Last Rate: 15 mL/hr at 05/13/20 1303, 15 mg/hr at 05/13/20 1303 .  multivitamin with minerals tablet 1 tablet, 1 tablet, Per Tube, Daily, AleOttie GlazierD, 1 tablet at 05/13/20 0948 .  norepinephrine (LEVOPHED) 16 mg in 250m75memix infusion, 0-40 mcg/min, Intravenous, Titrated, BlakAwilda Bill, Stopped at 05/10/20 0757 .  polyethylene glycol (MIRALAX / GLYCOLAX)  packet 17 g, 17 g, Per Tube, Daily, Awilda Bill, NP, 17 g at 05/09/20 0928 .  propofol (DIPRIVAN) 1000 MG/100ML infusion, 5-80 mcg/kg/min, Intravenous, Titrated, Blakeney, Dreama Saa, NP, Last Rate: 16.9 mL/hr at 05/13/20 0920, 20.033 mcg/kg/min at  05/13/20 0920 .  vasopressin (PITRESSIN) 20 Units in sodium chloride 0.9 % 100 mL infusion-*FOR SHOCK*, 0-0.04 Units/min, Intravenous, Continuous, Blakeney, Dana G, NP .  vecuronium (NORCURON) injection 10 mg, 10 mg, Intravenous, Q1H PRN, Awilda Bill, NP, 10 mg at 05/13/20 0444 .  zinc sulfate capsule 220 mg, 220 mg, Per Tube, Daily, Awilda Bill, NP, 220 mg at 05/13/20 2409    ALLERGIES   Patient has no known allergies.    REVIEW OF SYSTEMS    10 point ROS unable to perform due to sedation on MV  PHYSICAL EXAMINATION   Vital Signs: Temp:  [101.1 F (38.4 C)-102.7 F (39.3 C)] 101.7 F (38.7 C) (10/03 1200) Pulse Rate:  [109-135] 118 (10/03 1200) Resp:  [30-35] 31 (10/03 1200) BP: (95-154)/(58-87) 116/69 (10/03 1200) SpO2:  [89 %-96 %] 95 % (10/03 1200) FiO2 (%):  [65 %-70 %] 70 % (10/03 0805) Weight:  [170.6 kg] 170.6 kg (10/03 0411)  GENERAL:NAD HEAD: Normocephalic, atraumatic.  EYES: Pupils equal, round, reactive to light.  No scleral icterus.  MOUTH: Moist mucosal membrane. NECK: Supple. No thyromegaly. No nodules. No JVD.  PULMONARY: rhonchi bilaterally  CARDIOVASCULAR: S1 and S2. Regular rate and rhythm. No murmurs, rubs, or gallops.  GASTROINTESTINAL: Soft, nontender, non-distended. No masses. Positive bowel sounds. No hepatosplenomegaly.  MUSCULOSKELETAL: No swelling, clubbing, or edema.  NEUROLOGIC: Mild distress due to acute illness SKIN:intact,warm,dry   PERTINENT DATA     Infusions: . famotidine (PEPCID) IV 20 mg (05/13/20 1200)  . feeding supplement (VITAL 1.5 CAL) 1,000 mL (05/12/20 2215)  . fentaNYL infusion INTRAVENOUS 200 mcg/hr (05/13/20 1004)  . midazolam 15 mg/hr (05/13/20 1303)  . norepinephrine (LEVOPHED) Adult infusion Stopped (05/10/20 0757)  . propofol (DIPRIVAN) infusion 20.033 mcg/kg/min (05/13/20 0920)  . vasopressin     Scheduled Medications: . vitamin C  500 mg Per Tube Daily  . aspirin  81 mg Per Tube Daily  .  chlorhexidine gluconate (MEDLINE KIT)  15 mL Mouth Rinse BID  . Chlorhexidine Gluconate Cloth  6 each Topical Daily  . dexamethasone (DECADRON) injection  6 mg Intravenous Q24H  . docusate  100 mg Per Tube BID  . enoxaparin (LOVENOX) injection  40 mg Subcutaneous BID  . feeding supplement (PROSource TF)  90 mL Per Tube TID  . fentaNYL (SUBLIMAZE) injection  50 mcg Intravenous Once  . free water  300 mL Per Tube Q4H  . furosemide  80 mg Intravenous BID  . insulin aspart  0-20 Units Subcutaneous Q4H  . insulin detemir  20 Units Subcutaneous BID  . mouth rinse  15 mL Mouth Rinse 10 times per day  . multivitamin with minerals  1 tablet Per Tube Daily  . polyethylene glycol  17 g Per Tube Daily  . zinc sulfate  220 mg Per Tube Daily   PRN Medications: acetaminophen, dextrose, fentaNYL, ibuprofen, iohexol, vecuronium Hemodynamic parameters: CVP:  [16 mmHg-19 mmHg] 16 mmHg Intake/Output: 10/02 0701 - 10/03 0700 In: 6847.3 [I.V.:1507.3; NG/GT:5240; IV Piggyback:100] Out: 7000 [Urine:7000]  Ventilator  Settings: Vent Mode: PRVC FiO2 (%):  [65 %-70 %] 70 % Set Rate:  [35 bmp] 35 bmp Vt Set:  [460 mL] 460 mL PEEP:  [8 cmH20] 8 cmH20 Plateau Pressure:  [21  cmH20-29 cmH20] 21 cmH20   Other Labs:     LAB RESULTS:  Basic Metabolic Panel: Recent Labs  Lab 05/07/20 0518 05/07/20 0518 05/08/20 0501 05/08/20 1559 05/09/20 0430 05/09/20 1550 05/10/20 0402 05/10/20 1700 05/11/20 0414 05/11/20 0414 05/12/20 0325 05/13/20 0400  NA 144   < > 143  --  151*  --  152*  --  153*  --  152* 153*  K 4.6   < > 4.6   < > 4.7   < > 4.2   < > 4.3   < > 3.8 4.2  CL 105   < > 105  --  108  --  105  --  101  --  101 99  CO2 33*   < > 31  --  37*  --  40*  --  37*  --  44* 41*  GLUCOSE 224*   < > 437*  --  217*  --  181*  --  158*  --  132* 232*  BUN 42*   < > 50*  --  50*  --  52*  --  44*  --  46* 47*  CREATININE 1.83*   < > 1.30*  --  0.97  --  1.01  --  0.84  --  1.04 1.06  CALCIUM 7.7*   <  > 7.7*  --  8.2*  --  8.0*  --  8.2*  --  8.7* 8.5*  MG 3.5*  --  3.5*  --   --   --  3.2*  --   --   --   --  2.9*  PHOS 2.6   < > 4.7*  --  5.3*  --  5.7*  --  3.4  --  2.6 4.5   < > = values in this interval not displayed.   Liver Function Tests: Recent Labs  Lab 05/08/20 0501 05/09/20 0430 05/10/20 0402 05/11/20 0414 05/12/20 0325  AST 31 25 43* 49* 134*  ALT 40 37 52* 54* 74*  ALKPHOS 45 44 41 38 41  BILITOT 1.0 0.8 1.0 1.2 1.0  PROT 5.7* 5.6* 5.8* 5.9* 6.0*  ALBUMIN 3.0* 3.1* 3.1* 2.9* 2.8*   No results for input(s): LIPASE, AMYLASE in the last 168 hours. No results for input(s): AMMONIA in the last 168 hours. CBC: Recent Labs  Lab 05/09/20 0430 05/10/20 0402 05/11/20 0414 05/12/20 0325 05/13/20 0400  WBC 9.0 9.0 7.1 11.5* 15.5*  NEUTROABS 7.1 7.0 5.9 9.4* 12.1*  HGB 12.2* 12.5* 11.4* 11.3* 12.3*  HCT 39.4 41.4 39.3 38.6* 41.2  MCV 87.8 91.0 93.1 92.6 92.2  PLT 186 193 158 195 249   Cardiac Enzymes: No results for input(s): CKTOTAL, CKMB, CKMBINDEX, TROPONINI in the last 168 hours. BNP: Invalid input(s): POCBNP CBG: Recent Labs  Lab 05/12/20 2152 05/13/20 0213 05/13/20 0609 05/13/20 0734 05/13/20 1135  GLUCAP 219* 190* 278* 269* 274*       IMAGING RESULTS:  Imaging: ECHOCARDIOGRAM COMPLETE  Result Date: 05/11/2020    ECHOCARDIOGRAM REPORT   Patient Name:   DEAGO BURRUSS Date of Exam: 05/11/2020 Medical Rec #:  [id number]       Height:       70.0 in Accession #:    [id number]      Weight:       403.2 lb Date of Birth:  [birth year]       BSA:          2.812 m Patient Age:  34 years        BP:           129/61 mmHg Patient Gender: M               HR:           120 bpm. Exam Location:  [facility] Procedure: 2D Echo, Cardiac Doppler and Color Doppler Indications:     Abnormal ECG 794.31 / R94.31  History:         Patient has no prior history of Echocardiogram examinations.  Sonographer:     Alyse Low Roar Referring Phys:  [id number] Awilda Bill Diagnosing Phys:  Ida Rogue MD IMPRESSIONS  1. Left ventricular ejection fraction, by estimation, is 60 to 65%. The left ventricle has normal function. The left ventricle has no regional wall motion abnormalities. Left ventricular diastolic parameters are consistent with Grade I diastolic dysfunction (impaired relaxation).  2. Right ventricular systolic function is normal. The right ventricular size is normal.  3. Challenging images FINDINGS  Left Ventricle: Left ventricular ejection fraction, by estimation, is 60 to 65%. The left ventricle has normal function. The left ventricle has no regional wall motion abnormalities. The left ventricular internal cavity size was normal in size. There is  no left ventricular hypertrophy. Left ventricular diastolic parameters are consistent with Grade I diastolic dysfunction (impaired relaxation). Right Ventricle: The right ventricular size is normal. No increase in right ventricular wall thickness. Right ventricular systolic function is normal. Left Atrium: Left atrial size was normal in size. Right Atrium: Right atrial size was normal in size. Pericardium: There is no evidence of pericardial effusion. Mitral Valve: The mitral valve is normal in structure. No evidence of mitral valve regurgitation. No evidence of mitral valve stenosis. Tricuspid Valve: The tricuspid valve is normal in structure. Tricuspid valve regurgitation is not demonstrated. No evidence of tricuspid stenosis. Aortic Valve: The aortic valve was not well visualized. Aortic valve regurgitation is not visualized. No aortic stenosis is present. Aortic valve peak gradient measures 15.5 mmHg. Pulmonic Valve: The pulmonic valve was normal in structure. Pulmonic valve regurgitation is not visualized. No evidence of pulmonic stenosis. Aorta: The aortic root is normal in size and structure. Venous: The inferior vena cava is normal in size with greater than 50% respiratory variability, suggesting right atrial pressure of 3 mmHg.  IAS/Shunts: No atrial level shunt detected by color flow Doppler.  LEFT VENTRICLE PLAX 2D LVIDd:         4.38 cm  Diastology LVIDs:         3.29 cm  LV e' medial:    6.53 cm/s LV PW:         1.26 cm  LV E/e' medial:  16.1 LV IVS:        1.19 cm  LV e' lateral:   17.20 cm/s LVOT diam:     2.00 cm  LV E/e' lateral: 6.1 LVOT Area:     3.14 cm  RIGHT VENTRICLE RV Mid diam:    2.96 cm RV S prime:     30.40 cm/s TAPSE (M-mode): 2.0 cm LEFT ATRIUM             Index      RIGHT ATRIUM           Index LA diam:        3.70 cm 1.32 cm/m RA Area:     10.40 cm LA Vol (A2C):   25.7 ml 9.14 ml/m RA Volume:   20.50 ml  7.29  ml/m LA Vol (A4C):   23.7 ml 8.43 ml/m LA Biplane Vol: 24.8 ml 8.82 ml/m  AORTIC VALVE                PULMONIC VALVE AV Area (Vmax): 2.31 cm    PV Vmax:        1.66 m/s AV Vmax:        197.00 cm/s PV Peak grad:   11.0 mmHg AV Peak Grad:   15.5 mmHg   RVOT Peak grad: 7 mmHg LVOT Vmax:      145.00 cm/s  AORTA Ao Root diam: 2.70 cm MITRAL VALVE MV Area (PHT): 5.42 cm     SHUNTS MV Decel Time: 140 msec     Systemic Diam: 2.00 cm MV E velocity: 105.00 cm/s MV A velocity: 134.00 cm/s MV E/A ratio:  0.78 MV A Prime:    13.2 cm/s Ida Rogue MD Electronically signed by Ida Rogue MD Signature Date/Time: 05/11/2020/6:05:34 PM    Final    '@[facility]OSP' @ No results found.     ASSESSMENT AND PLAN    -Multidisciplinary rounds held today  Acute hypoxic respiratory failure due to COVID-19 ARDS COVID-19 pneumonia Underlying morbid obesity Mechanical intubation  Full vent support for now-vent settings reviewed and established  Mechanical ventilation via ARDS protocol, target PRVC 6 cc/kg Goal plateau pressure less than 30, driving pressure less than 15 SBT once all parameters met  VAP bundle implemented  Maintain O2 sats 88% or higher Continue remdesivir Continue steroids switched to Decadron 10 mg twice daily>>6 daily -05/08/20 Discontinue baricitinib due to leukopenia and lack of efficacy  Follow inflammatory markers Trend WBC and monitor fever curves  Follow cultures  Continue vancomycin and ceftriaxone started-09/25  -9/27- diurese and keep negative fluid balance.  Wean O2 currently at 65%.  Minimize fluid infusions. Patient febrile will start Tylenol                         -net 1550 urine out overnight  - patient had desaturation event. Vent changes discussed with RT. Diuresis upgraded to 80lasix bid. Rubinol IV tid for bradycardia and high secretions - patient with extensive subcutaneous emphysema - continue proning and weaning per protocol - mother came to [facility]ide last night.    ID -continue IV abx as prescibed -follow up cultures  GI/Nutrition GI PROPHYLAXIS as indicated DIET-->TF's as tolerated Constipation protocol as indicated  ENDO - ICU hypoglycemic\Hyperglycemia protocol -check FSBS per protocol   ELECTROLYTES -follow labs as needed -replace as needed -pharmacy consultation   DVT/GI PRX ordered -SCDs  TRANSFUSIONS AS NEEDED MONITOR FSBS ASSESS the need for LABS as needed   Critical care provider statement:    Critical care time (minutes):  33   Critical care time was exclusive of:  Separately billable procedures and treating other patients   Critical care was necessary to treat or prevent imminent or life-threatening deterioration of the following conditions:  ARDS due to [place] pneumonia   Critical care was time spent personally by me on the following activities:  Development of treatment plan with patient or surrogate, discussions with consultants, evaluation of patient's response to treatment, examination of patient, obtaining history from patient or surrogate, ordering and performing treatments and interventions, ordering and review of laboratory studies and re-evaluation of patient's condition.  I assumed direction of critical care for this patient from another provider in my specialty: no    This document was prepared using Dragon voice  recognition software and  may include unintentional dictation errors.    Ottie Glazier, M.D.  [facility]

## 2020-05-13 NOTE — Consult Note (Signed)
PHARMACY CONSULT NOTE  Pharmacy Consult for Electrolyte Monitoring and Replacement   Recent Labs: Potassium (mmol/L)  Date Value  05/13/2020 4.2   Magnesium (mg/dL)  Date Value  27/78/2423 2.9 (H)   Calcium (mg/dL)  Date Value  53/61/4431 8.5 (L)   Albumin (g/dL)  Date Value  54/00/8676 2.8 (L)   Phosphorus (mg/dL)  Date Value  19/50/9326 4.5   Sodium (mmol/L)  Date Value  05/13/2020 153 (H)  Corrected Calcium: 8.2  Assessment: Pharmacy has been consulted for electrolyte replacement in this 34 YOM requiring intubation and admission to the CCU for COVID PNA. Treatment including IV decadron, leading to the need for high amounts of insulin and eventually an insulin drip per the diabetes coordinator.  9/28 Electrolytes WNL with mag and phos elevated, possibly due to bump in Scr yesterday which has improved from 1.83>>1.30 and good UOP. Pt is receiving tube feeds and no maintenance fluids at this time.   9/29 Potassium relatively stable despite insulin drip (8.5 units/hr) and Lasix. Dose of Lasix increased today and started on free water flushes for hypernatremia. Will recheck electrolytes tomorrow AM. Doesn't appear to need as frequent monitoring; will address as necessary.  9/30 Na 152, K stable on insulin drip, Na still increased at 152. Free water flushes increased to 300 mL q4hours overnight. Pt also on lasix 80 mg BID. Scr 1.01 and producing good UOP.  10/1 Patient has not required electrolyte replacement despite insulin drip and increased Lasix dose.   Goal of Therapy:  Electrolytes WNL  Plan:  10/3 am labs: K 4.2  Mag 2.9  Phos 4.5  Scr 1.06.  No electrolyte replacement at this time (Lasix 80 mg IV BID). Will follow up in the morning.  Neilani Duffee A, PharmD 05/13/2020 9:16 AM

## 2020-05-13 NOTE — Progress Notes (Signed)
Patient had multiple occasion of vent dys synchrony upon pacing pt to prone position.. NP made aware. ETCO2 climbing to 90's.  Midazolam infusion increased to 15 mg/hour. Propofol re started. Boluses of vecuronium given. Pt still tachycardic at 120's secondary to pyrexia. T max= 39.1 now down to 38.4. Cooling blanket still on. Tylenol given as prescribed,.

## 2020-05-13 NOTE — Progress Notes (Signed)
Spoke with MD r/t increasing blood glucose level uncontrolled by insulin scale, received verbal order to hold TF for now.

## 2020-05-14 ENCOUNTER — Inpatient Hospital Stay: Payer: BC Managed Care – PPO

## 2020-05-14 LAB — CBC WITH DIFFERENTIAL/PLATELET
Abs Immature Granulocytes: 0.22 10*3/uL — ABNORMAL HIGH (ref 0.00–0.07)
Basophils Absolute: 0 10*3/uL (ref 0.0–0.1)
Basophils Relative: 0 %
Eosinophils Absolute: 0.1 10*3/uL (ref 0.0–0.5)
Eosinophils Relative: 1 %
HCT: 37.9 % — ABNORMAL LOW (ref 39.0–52.0)
Hemoglobin: 11.1 g/dL — ABNORMAL LOW (ref 13.0–17.0)
Immature Granulocytes: 2 %
Lymphocytes Relative: 14 %
Lymphs Abs: 1.8 10*3/uL (ref 0.7–4.0)
MCH: 27.5 pg (ref 26.0–34.0)
MCHC: 29.3 g/dL — ABNORMAL LOW (ref 30.0–36.0)
MCV: 94 fL (ref 80.0–100.0)
Monocytes Absolute: 1.5 10*3/uL — ABNORMAL HIGH (ref 0.1–1.0)
Monocytes Relative: 11 %
Neutro Abs: 9.6 10*3/uL — ABNORMAL HIGH (ref 1.7–7.7)
Neutrophils Relative %: 72 %
Platelets: 220 10*3/uL (ref 150–400)
RBC: 4.03 MIL/uL — ABNORMAL LOW (ref 4.22–5.81)
RDW: 15.2 % (ref 11.5–15.5)
WBC: 13.2 10*3/uL — ABNORMAL HIGH (ref 4.0–10.5)
nRBC: 0.2 % (ref 0.0–0.2)

## 2020-05-14 LAB — BASIC METABOLIC PANEL
Anion gap: 11 (ref 5–15)
BUN: 55 mg/dL — ABNORMAL HIGH (ref 6–20)
CO2: 45 mmol/L — ABNORMAL HIGH (ref 22–32)
Calcium: 8.8 mg/dL — ABNORMAL LOW (ref 8.9–10.3)
Chloride: 97 mmol/L — ABNORMAL LOW (ref 98–111)
Creatinine, Ser: 1.08 mg/dL (ref 0.61–1.24)
GFR calc Af Amer: >60 mL/min (ref >60)
GFR calc non Af Amer: >60 mL/min (ref >60)
Glucose, Bld: 145 mg/dL — ABNORMAL HIGH (ref 70–99)
Potassium: 3.6 mmol/L (ref 3.5–5.1)
Sodium: 153 mmol/L — ABNORMAL HIGH (ref 135–145)

## 2020-05-14 LAB — FIBRIN DERIVATIVES D-DIMER (ARMC ONLY): Fibrin derivatives D-dimer (ARMC): 1289.79 ng/mL (FEU) — ABNORMAL HIGH (ref 0.00–499.00)

## 2020-05-14 LAB — PROCALCITONIN: Procalcitonin: 0.4 ng/mL

## 2020-05-14 LAB — FERRITIN: Ferritin: 885 ng/mL — ABNORMAL HIGH (ref 24–336)

## 2020-05-14 LAB — BLOOD GAS, ARTERIAL
Acid-Base Excess: 22.7 mmol/L — ABNORMAL HIGH (ref 0.0–2.0)
Allens test (pass/fail): POSITIVE — AB
Bicarbonate: 50.7 mmol/L — ABNORMAL HIGH (ref 20.0–28.0)
FIO2: 1
MECHVT: 460 mL
O2 Saturation: 99.9 %
PEEP: 8 cmH2O
Patient temperature: 37
RATE: 35 resp/min
pCO2 arterial: 65 mmHg — ABNORMAL HIGH (ref 32.0–48.0)
pH, Arterial: 7.5 — ABNORMAL HIGH (ref 7.350–7.450)
pO2, Arterial: 280 mmHg — ABNORMAL HIGH (ref 83.0–108.0)

## 2020-05-14 LAB — GLUCOSE, CAPILLARY
Glucose-Capillary: 145 mg/dL — ABNORMAL HIGH (ref 70–99)
Glucose-Capillary: 152 mg/dL — ABNORMAL HIGH (ref 70–99)
Glucose-Capillary: 153 mg/dL — ABNORMAL HIGH (ref 70–99)
Glucose-Capillary: 229 mg/dL — ABNORMAL HIGH (ref 70–99)
Glucose-Capillary: 237 mg/dL — ABNORMAL HIGH (ref 70–99)
Glucose-Capillary: 307 mg/dL — ABNORMAL HIGH (ref 70–99)
Glucose-Capillary: 310 mg/dL — ABNORMAL HIGH (ref 70–99)

## 2020-05-14 LAB — C-REACTIVE PROTEIN: CRP: 3.1 mg/dL — ABNORMAL HIGH (ref <1.0)

## 2020-05-14 LAB — PHOSPHORUS: Phosphorus: 3.8 mg/dL (ref 2.5–4.6)

## 2020-05-14 LAB — TRIGLYCERIDES: Triglycerides: 460 mg/dL — ABNORMAL HIGH (ref <150)

## 2020-05-14 IMAGING — DX DG CHEST 1V PORT
1 series · 1 of 1 positions shown · non-contrast
Comparison: [DATE]

CLINICAL DATA: Acute respiratory failure.  COVID positive.

EXAM:
PORTABLE CHEST 1 VIEW

[chest ap]
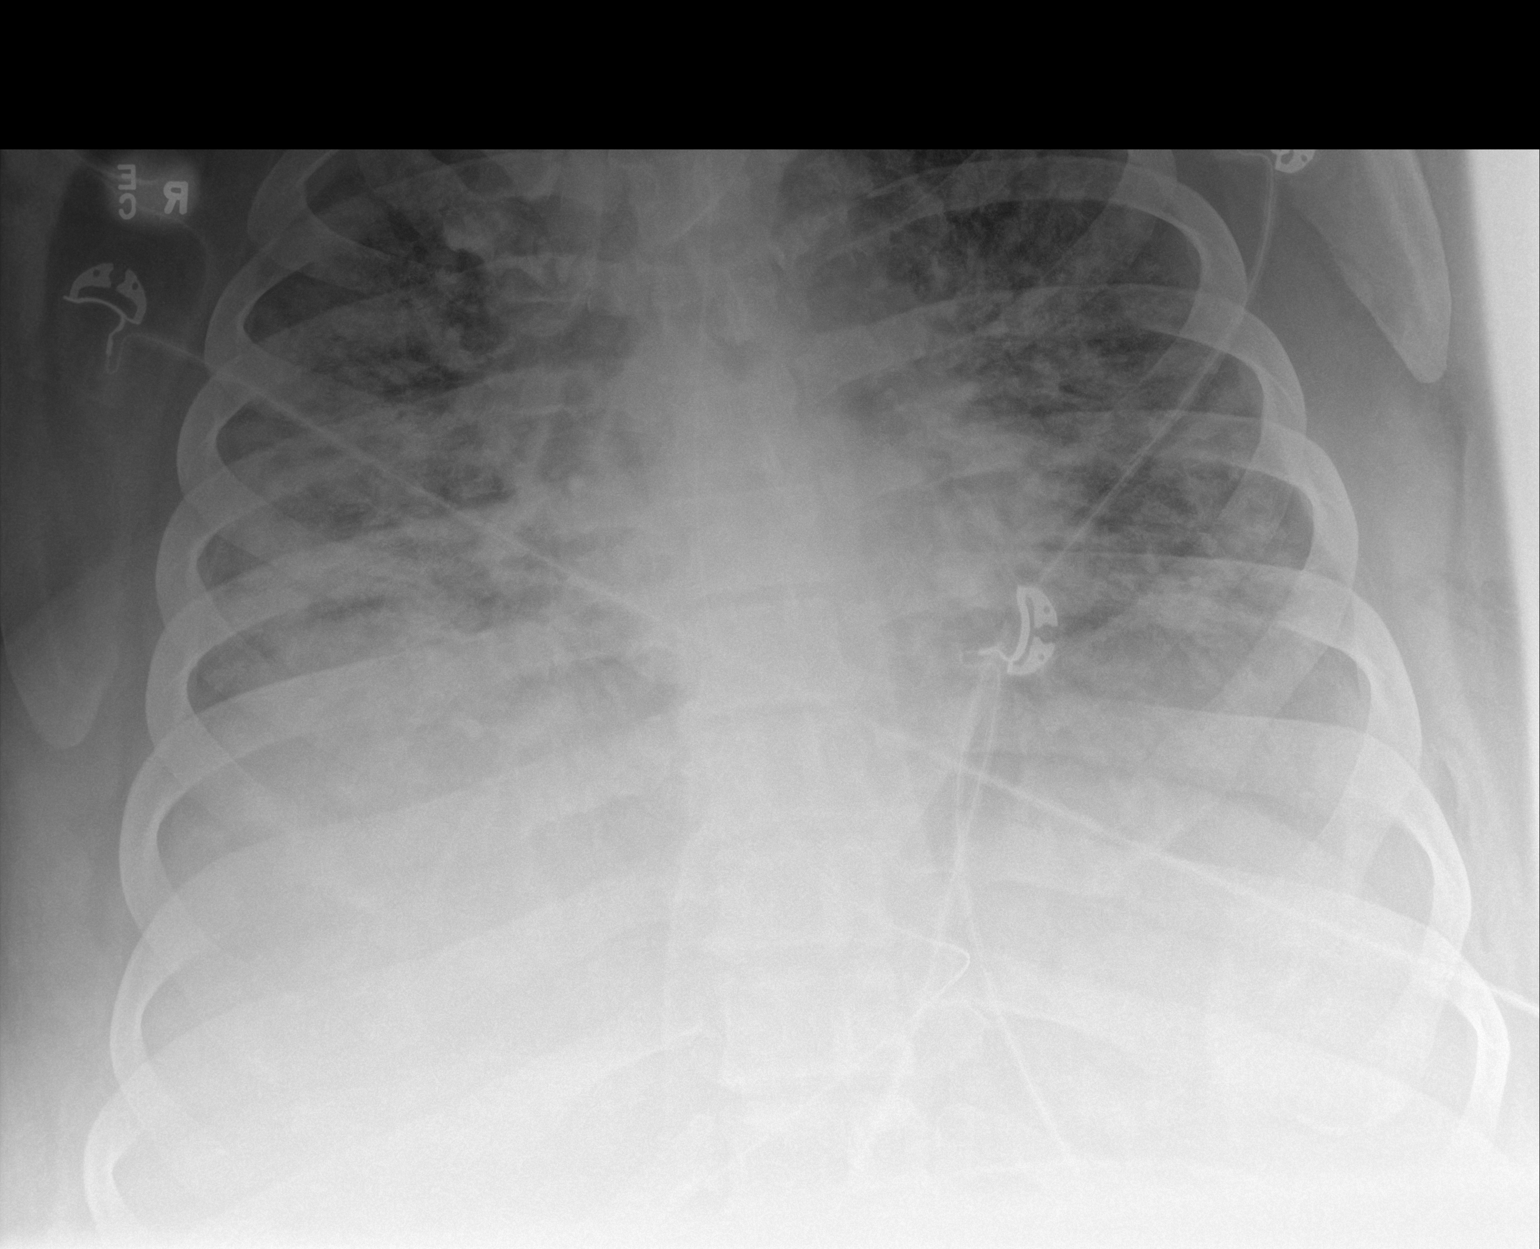

[1 of 1 positions shown; findings below may reference images not displayed]

FINDINGS: [9N] hours. Endotracheal tube tip is 5.1 cm above the base of the
carina. The NG tube passes into the stomach although the distal tip
position is not included on the film. Cardiopericardial silhouette
is at upper limits of normal for size. Diffuse bilateral airspace
disease is similar to prior. Left lung apex not included on the
film. Subcutaneous emphysema seen in the supraclavicular regions and
left chest wall previously has decreased.
IMPRESSION: No substantial interval change in the diffuse bilateral airspace
disease.

## 2020-05-14 MED ORDER — FUROSEMIDE 10 MG/ML IJ SOLN
80.0000 mg | Freq: Every day | INTRAMUSCULAR | Status: DC
  Administered 2020-05-15 – 2020-05-22 (×7): 80 mg via INTRAVENOUS
  Filled 2020-05-14 (×8): qty 8

## 2020-05-14 MED ORDER — NOREPINEPHRINE 16 MG/250ML-% IV SOLN
0.0000 ug/min | INTRAVENOUS | Status: DC
  Administered 2020-05-16: 2 ug/min via INTRAVENOUS
  Filled 2020-05-14: qty 250

## 2020-05-14 MED ORDER — INSULIN ASPART 100 UNIT/ML ~~LOC~~ SOLN
0.0000 [IU] | SUBCUTANEOUS | Status: DC
  Administered 2020-05-14 (×2): 15 [IU] via SUBCUTANEOUS
  Administered 2020-05-14: 11 [IU] via SUBCUTANEOUS
  Administered 2020-05-15: 7 [IU] via SUBCUTANEOUS
  Administered 2020-05-15: 15 [IU] via SUBCUTANEOUS
  Administered 2020-05-15 (×2): 7 [IU] via SUBCUTANEOUS
  Administered 2020-05-15 (×2): 11 [IU] via SUBCUTANEOUS
  Administered 2020-05-16: 20 [IU] via SUBCUTANEOUS
  Administered 2020-05-16: 11 [IU] via SUBCUTANEOUS
  Administered 2020-05-16: 15 [IU] via SUBCUTANEOUS
  Administered 2020-05-16 (×2): 11 [IU] via SUBCUTANEOUS
  Administered 2020-05-17: 15 [IU] via SUBCUTANEOUS
  Administered 2020-05-17: 11 [IU] via SUBCUTANEOUS
  Administered 2020-05-17: 15 [IU] via SUBCUTANEOUS
  Filled 2020-05-14 (×17): qty 1

## 2020-05-14 MED ORDER — SODIUM CHLORIDE 0.9 % IV SOLN
2.0000 g | Freq: Three times a day (TID) | INTRAVENOUS | Status: DC
  Administered 2020-05-14 – 2020-05-16 (×8): 2 g via INTRAVENOUS
  Filled 2020-05-14 (×10): qty 2

## 2020-05-14 NOTE — Progress Notes (Signed)
CRITICAL CARE PROGRESS NOTE    Name: KIDUS DELMAN MRN: [id number] DOB: 09/01/1985     LOS: 60   SUBJECTIVE FINDINGS & SIGNIFICANT EVENTS    Patient description:  34 yo with OSA, morbid obesity BMI>56 came in with flu like illness diagnosed with COVID19 pneumonia was initially ion Medical floor with hospitalist service initiated Remdesevir and barictitinib, he progressively became more dyspneic he was placed on BIPAP and did not tolerate then placed on HFNC did not tolerate patient himself consented to MV and was intubated in MICU. Mother was notified who is sick at home with [place].   Events:  9/27- patient on 65%FiO2 with proning protocol. CVP trending, goal negative fluid balance.   05/08/20- patient with worsening ventilation increased FiO2 to 100% overnight and had episode of severe bradycardia.  Concern for mucus plugging will perform recruitment maneuvers and possible bronchoscopy.  We were able to perform tracheal aspirate and have weaned down fIO2 to 75% 05/09/20- patient had bradycardic episode and he desaturated overnight with temporary increase to 100FiO2.  Lasix q12 going. I spoke to brother today for update.  His tracheal aspirate came back normal flora so we will stop antibiotic.  05/10/20-patient with pneumomediastinum and on maximal setting on ventilator. Poor prognosis very unfortunate young man. I called and updated Mother today [name].  05/11/20- patient weaned to 80%. Will continue with weaning and proning protocol  05/12/20- patient remains critically ill.  Mother at bedside we discussed his care plan, she is with strong faith and continues to pray for her son to improve.  05/13/20- patient remains crtically ill.  Proning per protocol. No significant events overnight.  05/14/20- patient remains  crtically ill. Continues to be febrile     Lines/tubes  9/25 - Left IJ placed  : Airway 8 mm (Active)  Secured at (cm) 26 cm 05/07/20 0801  Measured From Lips 05/07/20 [place] 05/07/20 0801  Secured By [employer] 05/07/20 0801  Tube Holder Repositioned Yes 05/07/20 0801  Cuff Pressure (cm H2O) 26 cm H2O 05/07/20 0801  Site Condition Dry 05/07/20 0801     CVC Triple Lumen 05/05/20 Left Internal jugular (Active)  Indication for Insertion or Continuance of Line Vasoactive infusions;Poor Vasculature-patient has had multiple peripheral attempts or PIVs lasting less than 24 hours 05/06/20 2130  Site Assessment Clean;Dry;Intact 05/06/20 2130  Proximal Lumen Status Infusing 05/06/20 2130  Medial Lumen Status Infusing 05/06/20 2130  Distal Lumen Status Infusing 05/06/20 2130  Dressing Type Transparent;Occlusive 05/06/20 2130  Dressing Status Clean;Dry;Intact 05/06/20 2130  Antimicrobial disc in place? Yes 05/06/20 2130  Line Care Connections checked and tightened 05/06/20 2130  Dressing Intervention Dressing reinforced 05/06/20 0900  Dressing Change Due 05/12/20 05/06/20 2130     NG/OG Tube Orogastric Center mouth Xray 65 cm (Active)  Cm Marking at Nare/Corner of Mouth (if applicable) 65 cm 57/01/77 2130  Site Assessment Clean;Dry;Intact 05/06/20 2130  Ongoing Placement Verification No change in cm markings or external length of tube from initial placement;No change in respiratory status;No acute changes, not attributed to clinical condition;Xray 05/06/20 2130  Status Clamped 05/06/20 2130     Urethral Catheter Donnald Garre, RN 16 Fr. (Active)  Indication for Insertion or Continuance of Catheter Unstable critically ill patients first 24-48 hours (See Criteria) 05/06/20 2000  Site Assessment Clean;Intact;Prolapse;Swelling 05/06/20 2000  Catheter Maintenance Bag below level of bladder;Catheter secured;Drainage bag/tubing not touching floor;Insertion date on  drainage bag;No dependent loops;Seal intact 05/06/20 2000  Collection  Container Standard drainage bag 05/06/20 2000  Securement Method Securing device (Describe) 05/06/20 2000  Urinary Catheter Interventions (if applicable) Unclamped 70/96/28 2000  Output (mL) 100 mL 05/07/20 0445    Microbiology/Sepsis markers: Results for orders placed or performed during the hospital encounter of 05/03/20  Blood Culture (routine x 2)     Status: None   Collection Time: 05/03/20  4:33 PM   Specimen: BLOOD  Result Value Ref Range Status   Specimen Description BLOOD BLOOD LEFT HAND  Final   Special Requests   Final    BOTTLES DRAWN AEROBIC AND ANAEROBIC Blood Culture results may not be optimal due to an inadequate volume of blood received in culture bottles   Culture   Final    NO GROWTH 5 DAYS Performed at [facility], [address], [place], [place] [address]    Report Status 05/08/2020 FINAL  Final  Blood Culture (routine x 2)     Status: None   Collection Time: 05/03/20  4:33 PM   Specimen: BLOOD  Result Value Ref Range Status   Specimen Description BLOOD BLOOD RIGHT HAND  Final   Special Requests   Final    BOTTLES DRAWN AEROBIC AND ANAEROBIC Blood Culture adequate volume   Culture   Final    NO GROWTH 5 DAYS Performed at [facility], [address] [place], [place] [address]    Report Status 05/08/2020 FINAL  Final  Respiratory Panel by RT PCR (Flu A&B, Covid) -     Status: Abnormal   Collection Time: 05/03/20  7:32 PM  Result Value Ref Range Status   SARS Coronavirus 2 by RT PCR POSITIVE (A) NEGATIVE Final    Comment: RESULT CALLED TO, READ BACK BY AND VERIFIED WITH: Leonides Schanz RN 2114 05/03/20 HNM/AR    Influenza A by PCR NEGATIVE NEGATIVE Final   Influenza B by PCR NEGATIVE NEGATIVE Final    Comment: Performed at [facility], [address] [place], [place] [address]  MRSA PCR Screening     Status: None   Collection Time: 05/06/20 11:51 AM    Specimen: Nasal Mucosa; Nasopharyngeal  Result Value Ref Range Status   MRSA by PCR NEGATIVE NEGATIVE Final    Comment:        The GeneXpert MRSA Assay (FDA approved for NASAL specimens only), is one component of a comprehensive MRSA colonization surveillance program. It is not intended to diagnose MRSA infection nor to guide or monitor treatment for MRSA infections. Performed at [facility], [address] [place], [place] [address]   Culture, respiratory (non-expectorated)     Status: None   Collection Time: 05/07/20 10:55 AM   Specimen: Tracheal Aspirate; Respiratory  Result Value Ref Range Status   Specimen Description   Final    TRACHEAL ASPIRATE Performed at [facility], [address] [place], [place] [address]    Special Requests   Final    NONE Performed at [facility], [place], [place] [address]    Gram Stain   Final    RARE WBC PRESENT,BOTH PMN AND MONONUCLEAR NO ORGANISMS SEEN    Culture   Final    RARE Normal respiratory flora-no Staph aureus or Pseudomonas seen Performed at [place] [address], [place],  [address]    Report Status 05/09/2020 FINAL  Final  CULTURE, BLOOD (ROUTINE X 2) w Reflex to ID Panel     Status: None (Preliminary result)  Collection Time: 05/10/20 12:47 PM   Specimen: BLOOD  Result Value Ref Range Status   Specimen Description BLOOD BLOOD RIGHT HAND  Final   Special Requests   Final    BOTTLES DRAWN AEROBIC AND ANAEROBIC Blood Culture adequate volume   Culture   Final    NO GROWTH 4 DAYS Performed at [facility], [address], [place], [place] [address]    Report Status PENDING  Incomplete  CULTURE, BLOOD (ROUTINE X 2) w Reflex to ID Panel     Status: None (Preliminary result)   Collection Time: 05/10/20  1:21 PM   Specimen: BLOOD  Result Value Ref Range Status   Specimen Description BLOOD BLOOD RIGHT HAND  Final   Special Requests   Final     BOTTLES DRAWN AEROBIC AND ANAEROBIC Blood Culture adequate volume   Culture   Final    NO GROWTH 4 DAYS Performed at [facility], [address], [place], [place] [address]    Report Status PENDING  Incomplete    Anti-infectives:  Anti-infectives (From admission, onward)   Start     Dose/Rate Route Frequency Ordered Stop   05/14/20 0600  ceFEPIme (MAXIPIME) 2 g in sodium chloride 0.9 % 100 mL IVPB        2 g 200 mL/hr over 30 Minutes Intravenous Every 8 hours 05/14/20 0533     05/07/20 1200  meropenem (MERREM) 1 g in sodium chloride 0.9 % 100 mL IVPB  Status:  Discontinued        1 g 200 mL/hr over 30 Minutes Intravenous Every 8 hours 05/07/20 1020 05/09/20 1019   05/06/20 1200  vancomycin (VANCOCIN) IVPB 1000 mg/200 mL premix  Status:  Discontinued        1,000 mg 200 mL/hr over 60 Minutes Intravenous Every 8 hours 05/05/20 2330 05/07/20 1020   05/06/20 0200  vancomycin (VANCOREADY) IVPB 500 mg/100 mL       "Followed by" Linked Group Details   500 mg 100 mL/hr over 60 Minutes Intravenous  Once 05/05/20 2330 05/06/20 0454   05/06/20 0000  vancomycin (VANCOREADY) IVPB 2000 mg/400 mL       "Followed by" Linked Group Details   2,000 mg 200 mL/hr over 120 Minutes Intravenous  Once 05/05/20 2330 05/06/20 0304   05/05/20 2315  cefTRIAXone (ROCEPHIN) 2 g in sodium chloride 0.9 % 100 mL IVPB  Status:  Discontinued        2 g 200 mL/hr over 30 Minutes Intravenous Daily at 10 pm 05/05/20 2307 05/07/20 1020   05/04/20 1000  remdesivir 100 mg in sodium chloride 0.9 % 100 mL IVPB       "Followed by" Linked Group Details   100 mg 200 mL/hr over 30 Minutes Intravenous Daily 05/03/20 1718 05/07/20 0941   05/03/20 1800  remdesivir 200 mg in sodium chloride 0.9% 250 mL IVPB       "Followed by" Linked Group Details   200 mg 580 mL/hr over 30 Minutes Intravenous Once 05/03/20 1718 05/03/20 1941       Consults: Treatment Team:  Pccm, Ander Gaster, MD     PAST MEDICAL HISTORY    Past Medical History:  Diagnosis Date  . Hearing loss in right ear   . Sleep apnea      SURGICAL HISTORY   Past Surgical History:  Procedure Laterality Date  . TONSILLECTOMY       FAMILY HISTORY   Family History  Problem Relation Age of Onset  .  Diabetes Mellitus II Mother   . Hypertension Mother   . Diabetes Mellitus II Father   . High blood pressure Father      SOCIAL HISTORY   Social History   Tobacco Use  . Smoking status: Never Smoker  . Smokeless tobacco: Never Used  Vaping Use  . Vaping Use: Never used  Substance Use Topics  . Alcohol use: No  . Drug use: No     MEDICATIONS   Current Medication:  Current Facility-Administered Medications:  .  acetaminophen (TYLENOL) tablet 1,000 mg, 1,000 mg, Oral, Q6H PRN, Sharion Settler, NP, 1,000 mg at 05/14/20 6237 .  ascorbic acid (VITAMIN C) tablet 500 mg, 500 mg, Per Tube, Daily, Awilda Bill, NP, 500 mg at 05/13/20 0948 .  aspirin chewable tablet 81 mg, 81 mg, Per Tube, Daily, Awilda Bill, NP, 81 mg at 05/13/20 0948 .  ceFEPIme (MAXIPIME) 2 g in sodium chloride 0.9 % 100 mL IVPB, 2 g, Intravenous, Q8H, Hall, Scott A, RPH, Last Rate: 200 mL/hr at 05/14/20 0700, Rate Verify at 05/14/20 0700 .  chlorhexidine gluconate (MEDLINE KIT) (PERIDEX) 0.12 % solution 15 mL, 15 mL, Mouth Rinse, BID, Tyler Pita, MD, 15 mL at 05/13/20 2059 .  Chlorhexidine Gluconate Cloth 2 % PADS 6 each, 6 each, Topical, Daily, Tyler Pita, MD, 6 each at 05/13/20 1630 .  dexamethasone (DECADRON) injection 6 mg, 6 mg, Intravenous, Q24H, Aleskerov, Fuad, MD, 6 mg at 05/13/20 1003 .  dextrose 50 % solution 0-50 mL, 0-50 mL, Intravenous, PRN, Lanney Gins, Fuad, MD .  docusate (COLACE) 50 MG/5ML liquid 100 mg, 100 mg, Per Tube, BID, Awilda Bill, NP, 100 mg at 05/13/20 2124 .  enoxaparin (LOVENOX) injection 40 mg, 40 mg, Subcutaneous, BID, Florina Ou V, MD, 40 mg at 05/13/20 2124 .  famotidine (PEPCID) IVPB 20 mg  premix, 20 mg, Intravenous, Q12H, Tyler Pita, MD, Stopped at 05/13/20 2152 .  feeding supplement (PROSource TF) liquid 90 mL, 90 mL, Per Tube, TID, Aleskerov, Fuad, MD, 90 mL at 05/13/20 2125 .  feeding supplement (VITAL 1.5 CAL) liquid 1,000 mL, 1,000 mL, Per Tube, Continuous, Aleskerov, Fuad, MD, Last Rate: 65 mL/hr at 05/14/20 0030, Rate Verify at 05/14/20 0030 .  fentaNYL (SUBLIMAZE) bolus via infusion 50 mcg, 50 mcg, Intravenous, Q15 min PRN, Awilda Bill, NP .  fentaNYL (SUBLIMAZE) injection 50 mcg, 50 mcg, Intravenous, Once, Blakeney, Dreama Saa, NP .  fentaNYL 2573mg in NS 251m(1058mml) infusion-PREMIX, 0-400 mcg/hr, Intravenous, Continuous, ChaBenita GutterPH, Last Rate: 35 mL/hr at 05/14/20 0716, 350 mcg/hr at 05/14/20 0716 .  free water 300 mL, 300 mL, Per Tube, Q4H, BlaAwilda BillP, 300 mL at 05/14/20 0358 .  furosemide (LASIX) injection 80 mg, 80 mg, Intravenous, BID, AleLanney Ginsuad, MD, 80 mg at 05/13/20 1825 .  ibuprofen (ADVIL) 100 MG/5ML suspension 600 mg, 600 mg, Per NG tube, Q8H PRN, Tukov-Yual, Magdalene S, NP, 600 mg at 05/13/20 1431 .  insulin aspart (novoLOG) injection 0-20 Units, 0-20 Units, Subcutaneous, Q4H, Tukov-Yual, Magdalene S, NP, 3 Units at 05/14/20 0521 .  insulin detemir (LEVEMIR) injection 20 Units, 20 Units, Subcutaneous, BID, AleOttie GlazierD, 20 Units at 05/13/20 2124 .  iohexol (OMNIPAQUE) 350 MG/ML injection 100 mL, 100 mL, Intravenous, Once PRN, BlaAwilda BillP .  MEDLINE mouth rinse, 15 mL, Mouth Rinse, 10 times per day, GonTyler PitaD, 15 mL at 05/14/20 0522 .  midazolam (VERSED) 50 mg in dextrose  5 % 50 mL (1 mg/mL) infusion, 0.5-15 mg/hr, Intravenous, Continuous, Tukov-Yual, Magdalene S, NP, Last Rate: 15 mL/hr at 05/14/20 0700, 15 mg/hr at 05/14/20 0700 .  multivitamin with minerals tablet 1 tablet, 1 tablet, Per Tube, Daily, Ottie Glazier, MD, 1 tablet at 05/13/20 0948 .  norepinephrine (LEVOPHED) 16 mg in 284m  premix infusion, 0-40 mcg/min, Intravenous, Titrated, BAwilda Bill NP, Stopped at 05/10/20 0757 .  polyethylene glycol (MIRALAX / GLYCOLAX) packet 17 g, 17 g, Per Tube, Daily, BAwilda Bill NP, 17 g at 05/09/20 0928 .  propofol (DIPRIVAN) 1000 MG/100ML infusion, 5-80 mcg/kg/min, Intravenous, Titrated, Blakeney, Dana G, NP, Last Rate: 42.2 mL/hr at 05/14/20 0700, 50 mcg/kg/min at 05/14/20 0700 .  vasopressin (PITRESSIN) 20 Units in sodium chloride 0.9 % 100 mL infusion-*FOR SHOCK*, 0-0.04 Units/min, Intravenous, Continuous, Blakeney, Dana G, NP .  vecuronium (NORCURON) injection 10 mg, 10 mg, Intravenous, Q1H PRN, BAwilda Bill NP, 10 mg at 05/14/20 04081.  zinc sulfate capsule 220 mg, 220 mg, Per Tube, Daily, BAwilda Bill NP, 220 mg at 05/13/20 04481   ALLERGIES   Patient has no known allergies.    REVIEW OF SYSTEMS    10 point ROS unable to perform due to sedation on MV  PHYSICAL EXAMINATION   Vital Signs: Temp:  [98.8 F (37.1 C)-102.6 F (39.2 C)] 101.3 F (38.5 C) (10/04 0622) Pulse Rate:  [87-144] 140 (10/04 0700) Resp:  [17-37] 28 (10/04 0700) BP: (94-169)/(52-103) 104/52 (10/04 0700) SpO2:  [83 %-98 %] 92 % (10/04 0824) FiO2 (%):  [70 %-100 %] 100 % (10/04 0824)  GENERAL:NAD HEAD: Normocephalic, atraumatic.  EYES: Pupils equal, round, reactive to light.  No scleral icterus.  MOUTH: Moist mucosal membrane. NECK: Supple. No thyromegaly. No nodules. No JVD.  PULMONARY: rhonchi bilaterally  CARDIOVASCULAR: S1 and S2. Regular rate and rhythm. No murmurs, rubs, or gallops.  GASTROINTESTINAL: Soft, nontender, non-distended. No masses. Positive bowel sounds. No hepatosplenomegaly.  MUSCULOSKELETAL: No swelling, clubbing, or edema.  NEUROLOGIC: Mild distress due to acute illness SKIN:intact,warm,dry   PERTINENT DATA     Infusions: . ceFEPime (MAXIPIME) IV 200 mL/hr at 05/14/20 0700  . famotidine (PEPCID) IV Stopped (05/13/20 2152)  . feeding  supplement (VITAL 1.5 CAL) 65 mL/hr at 05/14/20 0030  . fentaNYL infusion INTRAVENOUS 350 mcg/hr (05/14/20 0716)  . midazolam 15 mg/hr (05/14/20 0700)  . norepinephrine (LEVOPHED) Adult infusion Stopped (05/10/20 0757)  . propofol (DIPRIVAN) infusion 50 mcg/kg/min (05/14/20 0700)  . vasopressin     Scheduled Medications: . vitamin C  500 mg Per Tube Daily  . aspirin  81 mg Per Tube Daily  . chlorhexidine gluconate (MEDLINE KIT)  15 mL Mouth Rinse BID  . Chlorhexidine Gluconate Cloth  6 each Topical Daily  . dexamethasone (DECADRON) injection  6 mg Intravenous Q24H  . docusate  100 mg Per Tube BID  . enoxaparin (LOVENOX) injection  40 mg Subcutaneous BID  . feeding supplement (PROSource TF)  90 mL Per Tube TID  . fentaNYL (SUBLIMAZE) injection  50 mcg Intravenous Once  . free water  300 mL Per Tube Q4H  . furosemide  80 mg Intravenous BID  . insulin aspart  0-20 Units Subcutaneous Q4H  . insulin detemir  20 Units Subcutaneous BID  . mouth rinse  15 mL Mouth Rinse 10 times per day  . multivitamin with minerals  1 tablet Per Tube Daily  . polyethylene glycol  17 g Per Tube Daily  . zinc sulfate  220 mg Per Tube Daily   PRN Medications: acetaminophen, dextrose, fentaNYL, ibuprofen, iohexol, vecuronium Hemodynamic parameters:   Intake/Output: 10/03 0701 - 10/04 0700 In: 2892.7 [I.V.:1508.5; NG/GT:1202.5; IV Piggyback:181.6] Out: 2450 [Urine:2450]  Ventilator  Settings: Vent Mode: PRVC FiO2 (%):  [70 %-100 %] 100 % Set Rate:  [35 bmp] 35 bmp Vt Set:  [460 mL] 460 mL PEEP:  [8 cmH20-10 cmH20] 10 cmH20 Plateau Pressure:  [22 cmH20-28 cmH20] 28 cmH20   Other Labs:     LAB RESULTS:  Basic Metabolic Panel: Recent Labs  Lab 05/08/20 0501 05/08/20 1559 05/10/20 0402 05/10/20 1700 05/11/20 0414 05/11/20 0414 05/12/20 0325 05/12/20 0325 05/13/20 0400 05/14/20 0523  NA 143   < > 152*  --  153*  --  152*  --  153* 153*  K 4.6   < > 4.2   < > 4.3   < > 3.8   < > 4.2 3.6   CL 105   < > 105  --  101  --  101  --  99 97*  CO2 31   < > 40*  --  37*  --  44*  --  41* 45*  GLUCOSE 437*   < > 181*  --  158*  --  132*  --  232* 145*  BUN 50*   < > 52*  --  44*  --  46*  --  47* 55*  CREATININE 1.30*   < > 1.01  --  0.84  --  1.04  --  1.06 1.08  CALCIUM 7.7*   < > 8.0*  --  8.2*  --  8.7*  --  8.5* 8.8*  MG 3.5*  --  3.2*  --   --   --   --   --  2.9*  --   PHOS 4.7*   < > 5.7*  --  3.4  --  2.6  --  4.5 3.8   < > = values in this interval not displayed.   Liver Function Tests: Recent Labs  Lab 05/08/20 0501 05/09/20 0430 05/10/20 0402 05/11/20 0414 05/12/20 0325  AST 31 25 43* 49* 134*  ALT 40 37 52* 54* 74*  ALKPHOS 45 44 41 38 41  BILITOT 1.0 0.8 1.0 1.2 1.0  PROT 5.7* 5.6* 5.8* 5.9* 6.0*  ALBUMIN 3.0* 3.1* 3.1* 2.9* 2.8*   No results for input(s): LIPASE, AMYLASE in the last 168 hours. No results for input(s): AMMONIA in the last 168 hours. CBC: Recent Labs  Lab 05/10/20 0402 05/11/20 0414 05/12/20 0325 05/13/20 0400 05/14/20 0523  WBC 9.0 7.1 11.5* 15.5* 13.2*  NEUTROABS 7.0 5.9 9.4* 12.1* 9.6*  HGB 12.5* 11.4* 11.3* 12.3* 11.1*  HCT 41.4 39.3 38.6* 41.2 37.9*  MCV 91.0 93.1 92.6 92.2 94.0  PLT 193 158 195 249 220   Cardiac Enzymes: No results for input(s): CKTOTAL, CKMB, CKMBINDEX, TROPONINI in the last 168 hours. BNP: Invalid input(s): POCBNP CBG: Recent Labs  Lab 05/13/20 2004 05/13/20 2323 05/14/20 0059 05/14/20 0450 05/14/20 0727  GLUCAP 250* 202* 153* 145* 152*       IMAGING RESULTS:  Imaging: DG Chest Port 1 View  Result Date: 05/14/2020 CLINICAL DATA:  Acute respiratory failure.  COVID positive. EXAM: PORTABLE CHEST 1 VIEW COMPARISON:  05/11/2020 FINDINGS: 0443 hours. Endotracheal tube tip is 5.1 cm above the base of the carina. The NG tube passes into the stomach although the distal tip position is not included on the film. Cardiopericardial  silhouette is at upper limits of normal for size. Diffuse bilateral  airspace disease is similar to prior. Left lung apex not included on the film. Subcutaneous emphysema seen in the supraclavicular regions and left chest wall previously has decreased. IMPRESSION: No substantial interval change in the diffuse bilateral airspace disease. Electronically Signed   By: [name] M.D.   On: 05/14/2020 07:36   '@[email or web address]' @ DG Chest Port 1 View  Result Date: 05/14/2020 CLINICAL DATA:  Acute respiratory failure.  COVID positive. EXAM: PORTABLE CHEST 1 VIEW COMPARISON:  05/11/2020 FINDINGS: 0443 hours. Endotracheal tube tip is 5.1 cm above the base of the carina. The NG tube passes into the stomach although the distal tip position is not included on the film. Cardiopericardial silhouette is at upper limits of normal for size. Diffuse bilateral airspace disease is similar to prior. Left lung apex not included on the film. Subcutaneous emphysema seen in the supraclavicular regions and left chest wall previously has decreased. IMPRESSION: No substantial interval change in the diffuse bilateral airspace disease. Electronically Signed   By: [name] M.D.   On: 05/14/2020 07:36    ASSESSMENT AND PLAN    -Multidisciplinary rounds held today  Acute hypoxic respiratory failure due to COVID-19 ARDS COVID-19 pneumonia Underlying morbid obesity Mechanical intubation  Full vent support for now-vent settings reviewed and established  Mechanical ventilation via ARDS protocol, target PRVC 6 cc/kg Goal plateau pressure less than 30, driving pressure less than 15 SBT once all parameters met  VAP bundle implemented  Maintain O2 sats 88% or higher Remdesivir therapy complete Continue steroids switched to Decadron 10 mg twice daily>>6 daily -05/08/20 Discontinue baricitinib due to leukopenia and lack of efficacy Follow inflammatory markers Trend WBC and monitor fever curves  Follow cultures  Continue vancomycin and ceftriaxone started-09/25  -9/27- diurese and keep negative  fluid balance.  Wean O2 currently at 65%.  Minimize fluid infusions. Patient febrile will start Tylenol                         -net 1550 urine out overnight  - patient had desaturation event. Vent changes discussed with RT. Diuresis upgraded to 80lasix bid. Rubinol IV tid for bradycardia and high secretions - patient with extensive subcutaneous emphysema - continue proning and weaning per protocol - mother came to [facility]ide last night.    ID -continue IV abx as prescibed -follow up cultures  GI/Nutrition GI PROPHYLAXIS as indicated DIET-->TF's as tolerated Constipation protocol as indicated  ENDO - ICU hypoglycemic\Hyperglycemia protocol -check FSBS per protocol   ELECTROLYTES -follow labs as needed -replace as needed -pharmacy consultation   DVT/GI PRX ordered -SCDs  TRANSFUSIONS AS NEEDED MONITOR FSBS ASSESS the need for LABS as needed   Critical care provider statement:    Critical care time (minutes):  33   Critical care time was exclusive of:  Separately billable procedures and treating other patients   Critical care was necessary to treat or prevent imminent or life-threatening deterioration of the following conditions:  ARDS due to [place] pneumonia   Critical care was time spent personally by me on the following activities:  Development of treatment plan with patient or surrogate, discussions with consultants, evaluation of patient's response to treatment, examination of patient, obtaining history from patient or surrogate, ordering and performing treatments and interventions, ordering and review of laboratory studies and re-evaluation of patient's condition.  I assumed direction of critical care for this patient from another provider in my  specialty: no    This document was prepared using Dragon voice recognition software and may include unintentional dictation errors.   Jorje Guild, M.D., M.P.H.  Pulmonary & Critical Care Medicine

## 2020-05-14 NOTE — Progress Notes (Signed)
Patient not able to tolerate prone position, HR and peak pressures elevated. RT recommended to flip back to supine position.

## 2020-05-14 NOTE — Progress Notes (Signed)
Nutrition Follow-up  DOCUMENTATION CODES:   Morbid obesity  INTERVENTION:  Once plan is to resume tube feeds recommend: -Vital 1.5 Cal at 40 mL/hr (960 mL goal daily volume) per tube -PROSource TF 90 mL 5 times daily per tube -Goal regiment provides 1840 kcal, 175 grams of protein, 730 mL H2O daily. With current propofol rate provides 2730 kcal daily.  Continue MVI daily per tube.  NUTRITION DIAGNOSIS:   Inadequate oral intake related to inability to eat as evidenced by NPO status.  Ongoing.  GOAL:   Patient will meet greater than or equal to 90% of their needs  Not met at this time.  MONITOR:   Vent status, Labs, Weight trends, TF tolerance, I & O's  REASON FOR ASSESSMENT:   Ventilator, Consult Enteral/tube feeding initiation and management  ASSESSMENT:   34 year old male with PMHx of OSA admitted with COVID-19 PNA.  9/25 intubated 10/3 tube feeds held in setting of hyperglycemia  Patient is currently intubated on ventilator support MV: 18.3 L/min Temp (24hrs), Avg:100.7 F (38.2 C), Min:98.8 F (37.1 C), Max:102.6 F (39.2 C)  Propofol: 33.7 ml/hr (890 kcal daily)  Medications reviewed and include: vitamin C 500 mg daily, Decadron 6 mg Q24hrs IV, Colace 100 mg BID, free water 300 mL Q4hrs, Lasix 80 mg daily IV, Novolog 0-20 units Q4hrs, Levemir 20 units BID, MVI daily, Miralax, zinc sulfate 220 mg daily, cefepime, famotidine, fentanyl gtt, Versed gtt, propofol gtt, vasopressin gtt at 0.03 units/min.  Labs reviewed: CBG 152-229, Sodium 153, chloride 97, CO2 45, BUN 55.  I/O: 2450 mL UOP yesterday (0.6 mL/kg/hr)  Weight trend: 170.6 kg on 10/3; -7.6 kg from 9/27  Enteral Access: OGT placed 9/25; terminates in upper stomach per abdominal x-ray 9/25  Discussed with RN and on rounds. Per MD hold off on restarting tube feeds for now and patient is starting on pressors today.  Diet Order:   Diet Order    None     EDUCATION NEEDS:   No education needs  have been identified at this time  Skin:  Skin Assessment: Skin Integrity Issues: (stage 2 bilateral toes (no comment on which toes))  Last BM:  05/14/2020 - small type 7 per rectal tube  Height:   Ht Readings from Last 1 Encounters:  05/12/20 _0  (1.778 m)   Weight:   Wt Readings from Last 1 Encounters:  05/13/20 (!) 170.6 kg   Ideal Body Weight:  75.5 kg  BMI:  Body mass index is 53.97 kg/m.  Estimated Nutritional Needs:   Kcal:  2600-2800  Protein:  170-180 grams  Fluid:  >/= 2 L/day  Jacklynn Barnacle, MS, RD, LDN Pager number available on Amion

## 2020-05-14 NOTE — Progress Notes (Signed)
Per Dr. Lilyan Punt maintain a RASS of -5. Increased versed to 15 mg/hr, propofol at 40 mcg /kg/min and fentanyl at 350 mcg/hr. MD will order PRN Vecuronium.

## 2020-05-14 NOTE — Consult Note (Signed)
PHARMACY CONSULT NOTE  Pharmacy Consult for Electrolyte Monitoring and Replacement   Recent Labs: Potassium (mmol/L)  Date Value  05/14/2020 3.6   Magnesium (mg/dL)  Date Value  77/06/6578 2.9 (H)   Calcium (mg/dL)  Date Value  03/83/3383 8.8 (L)   Albumin (g/dL)  Date Value  29/19/1660 2.8 (L)   Phosphorus (mg/dL)  Date Value  60/11/5995 3.8   Sodium (mmol/L)  Date Value  05/14/2020 153 (H)   Corrected Calcium: 9.8 mg/dL  Assessment: Pharmacy has been consulted for electrolyte replacement in this 34 YOM requiring intubation and admission to the CCU for COVID PNA. Treatment including IV decadron, leading to the need for high amounts of insulin and eventually an insulin drip that was subsequently transitioned to subcutaneous insulin. He is receiving tube feeds at 65 mL/hr and ProSource liquid 90 mL TID  Free water: 300 mL every 4 hours  Goal of Therapy:  Electrolytes WNL  Plan:   No electrolyte replacement at this time follow up in the morning  Follow-up electrolytes in am  Lowella Bandy, PharmD 05/14/2020 9:47 AM

## 2020-05-14 NOTE — Progress Notes (Signed)
RT called to bedside.

## 2020-05-14 NOTE — Progress Notes (Signed)
Pharmacy Antibiotic Note  Collin Brown is a 34 y.o. male admitted on 05/03/2020 with pneumonia.  Pharmacy has been consulted for Cefepime dosing.  Plan: Cefepime 2gm IV q8hrs  Height: 5\' 10"  (177.8 cm) Weight: (!) 170.6 kg (376 lb 1.7 oz) IBW/kg (Calculated) : 73  Temp (24hrs), Avg:100.7 F (38.2 C), Min:98.8 F (37.1 C), Max:102.6 F (39.2 C)  Recent Labs  Lab 05/09/20 0430 05/10/20 0402 05/11/20 0414 05/12/20 0325 05/13/20 0400  WBC 9.0 9.0 7.1 11.5* 15.5*  CREATININE 0.97 1.01 0.84 1.04 1.06    Estimated Creatinine Clearance: 157 mL/min (by C-G formula based on SCr of 1.06 mg/dL).    No Known Allergies  Antimicrobials this admission:   >>    >>   Dose adjustments this admission:   Microbiology results:  BCx:   UCx:    Sputum:    MRSA PCR:   Thank you for allowing pharmacy to be a part of this patient's care.  07/13/20 A 05/14/2020 5:34 AM

## 2020-05-15 LAB — GLUCOSE, CAPILLARY
Glucose-Capillary: 211 mg/dL — ABNORMAL HIGH (ref 70–99)
Glucose-Capillary: 227 mg/dL — ABNORMAL HIGH (ref 70–99)
Glucose-Capillary: 287 mg/dL — ABNORMAL HIGH (ref 70–99)
Glucose-Capillary: 315 mg/dL — ABNORMAL HIGH (ref 70–99)
Glucose-Capillary: 285 mg/dL — ABNORMAL HIGH (ref 70–99)
Glucose-Capillary: 241 mg/dL — ABNORMAL HIGH (ref 70–99)

## 2020-05-15 LAB — CBC WITH DIFFERENTIAL/PLATELET
Abs Immature Granulocytes: 0.17 10*3/uL — ABNORMAL HIGH (ref 0.00–0.07)
Basophils Absolute: 0 10*3/uL (ref 0.0–0.1)
Basophils Relative: 0 %
Eosinophils Absolute: 0.1 10*3/uL (ref 0.0–0.5)
Eosinophils Relative: 0 %
HCT: 33.2 % — ABNORMAL LOW (ref 39.0–52.0)
Hemoglobin: 9.9 g/dL — ABNORMAL LOW (ref 13.0–17.0)
Immature Granulocytes: 1 %
Lymphocytes Relative: 12 %
Lymphs Abs: 1.6 10*3/uL (ref 0.7–4.0)
MCH: 27.1 pg (ref 26.0–34.0)
MCHC: 29.8 g/dL — ABNORMAL LOW (ref 30.0–36.0)
MCV: 91 fL (ref 80.0–100.0)
Monocytes Absolute: 1 10*3/uL (ref 0.1–1.0)
Monocytes Relative: 8 %
Neutro Abs: 10.2 10*3/uL — ABNORMAL HIGH (ref 1.7–7.7)
Neutrophils Relative %: 79 %
Platelets: 218 10*3/uL (ref 150–400)
RBC: 3.65 MIL/uL — ABNORMAL LOW (ref 4.22–5.81)
RDW: 15.3 % (ref 11.5–15.5)
WBC: 13.1 10*3/uL — ABNORMAL HIGH (ref 4.0–10.5)
nRBC: 0.2 % (ref 0.0–0.2)

## 2020-05-15 LAB — C-REACTIVE PROTEIN: CRP: 3.6 mg/dL — ABNORMAL HIGH (ref <1.0)

## 2020-05-15 LAB — BASIC METABOLIC PANEL
Anion gap: 12 (ref 5–15)
BUN: 70 mg/dL — ABNORMAL HIGH (ref 6–20)
CO2: 38 mmol/L — ABNORMAL HIGH (ref 22–32)
Calcium: 8.5 mg/dL — ABNORMAL LOW (ref 8.9–10.3)
Chloride: 95 mmol/L — ABNORMAL LOW (ref 98–111)
Creatinine, Ser: 0.99 mg/dL (ref 0.61–1.24)
GFR calc Af Amer: >60 mL/min (ref >60)
GFR calc non Af Amer: >60 mL/min (ref >60)
Glucose, Bld: 245 mg/dL — ABNORMAL HIGH (ref 70–99)
Potassium: 3.6 mmol/L (ref 3.5–5.1)
Sodium: 145 mmol/L (ref 135–145)

## 2020-05-15 LAB — CULTURE, BLOOD (ROUTINE X 2)
Culture: NO GROWTH
Culture: NO GROWTH
Special Requests: ADEQUATE
Special Requests: ADEQUATE

## 2020-05-15 LAB — PHOSPHORUS: Phosphorus: 3.6 mg/dL (ref 2.5–4.6)

## 2020-05-15 LAB — URINE CULTURE: Culture: NO GROWTH

## 2020-05-15 LAB — MAGNESIUM: Magnesium: 3 mg/dL — ABNORMAL HIGH (ref 1.7–2.4)

## 2020-05-15 LAB — FIBRIN DERIVATIVES D-DIMER (ARMC ONLY): Fibrin derivatives D-dimer (ARMC): 974.99 ng/mL (FEU) — ABNORMAL HIGH (ref 0.00–499.00)

## 2020-05-15 LAB — PROCALCITONIN: Procalcitonin: 1.02 ng/mL

## 2020-05-15 LAB — TRIGLYCERIDES: Triglycerides: 548 mg/dL — ABNORMAL HIGH (ref <150)

## 2020-05-15 LAB — FERRITIN: Ferritin: 1005 ng/mL — ABNORMAL HIGH (ref 24–336)

## 2020-05-15 MED ORDER — PROSOURCE TF PO LIQD
90.0000 mL | Freq: Three times a day (TID) | ORAL | Status: DC
  Administered 2020-05-15 – 2020-05-20 (×15): 90 mL
  Filled 2020-05-15 (×17): qty 90

## 2020-05-15 MED ORDER — FUROSEMIDE 10 MG/ML IJ SOLN
80.0000 mg | Freq: Every day | INTRAMUSCULAR | Status: DC
  Administered 2020-05-15 – 2020-05-21 (×5): 80 mg via INTRAVENOUS
  Filled 2020-05-15 (×7): qty 8

## 2020-05-15 MED ORDER — VITAL 1.5 CAL PO LIQD
1000.0000 mL | ORAL | Status: DC
  Administered 2020-05-15 – 2020-05-18 (×5): 1000 mL

## 2020-05-15 MED ORDER — SODIUM CHLORIDE 0.9 % IV SOLN
INTRAVENOUS | Status: DC | PRN
  Administered 2020-05-17 – 2020-05-21 (×2): 500 mL via INTRAVENOUS
  Administered 2020-06-03 – 2020-06-06 (×2): 250 mL via INTRAVENOUS

## 2020-05-15 MED ORDER — INSULIN DETEMIR 100 UNIT/ML ~~LOC~~ SOLN
15.0000 [IU] | Freq: Once | SUBCUTANEOUS | Status: AC
  Administered 2020-05-15: 15 [IU] via SUBCUTANEOUS
  Filled 2020-05-15: qty 0.15

## 2020-05-15 MED ORDER — INSULIN DETEMIR 100 UNIT/ML ~~LOC~~ SOLN
35.0000 [IU] | Freq: Two times a day (BID) | SUBCUTANEOUS | Status: DC
  Administered 2020-05-15: 35 [IU] via SUBCUTANEOUS
  Filled 2020-05-15 (×3): qty 0.35

## 2020-05-15 NOTE — Progress Notes (Signed)
Nutrition Follow-up  DOCUMENTATION CODES:   Morbid obesity  INTERVENTION:  Initiate Vital 1.5 Cal at 70 mL/hr (1680 mL goal daily volume) per tube  Provide PROSource TF 90 mL TID per tube  Goal regimen provides 2760 kcal, 179 grams of protein, 1277 mL H2O daily  NUTRITION DIAGNOSIS:   Inadequate oral intake related to inability to eat as evidenced by NPO status.  Ongoing.  GOAL:   Patient will meet greater than or equal to 90% of their needs  Progressing with resumption of tube feeds.  MONITOR:   Vent status, Labs, Weight trends, TF tolerance, I & O's  REASON FOR ASSESSMENT:   Ventilator, Consult Enteral/tube feeding initiation and management  ASSESSMENT:   34 year old male with PMHx of OSA admitted with COVID-19 PNA.  9/25 intubated 10/3 tube feeds held in setting of hyperglycemia  Patient is currently intubated on ventilator support MV: 16 L/min Temp (24hrs), Avg:100.5 F (38.1 C), Min:98.2 F (36.8 C), Max:102.3 F (39.1 C)  Medications reviewed and include: vitamin C 500 mg daily, Decadron 6 mg Q24 hrs IV, Colace 100 mg BID per tube, free water 300 mL Q4hrs, Lasix 80 mg daily IV (ordered x 2 - once at 1000 and 1600), Novolog 0-20 units Q4hrs, Levemir 35 units BID, MVI daily, Miralax, zinc sulfate 220 mg daily, cefepime, famotidine, fentanyl gtt, Versed gtt, vasopressin gtt at 0.03 units/min.  Labs reviewed: CBG 211-287, Chloride 95, CO2 38, BUN 70, Magnesium 3, Triglycerides 548.  I/O: 1950 mL UOP yesterday (0.5 mL/kg/hr)  Weight trend: 173.4 kg on 10/5; -4.8 kg from 9/27  Enteral Access: OGT placed 9/25; terminates in upper stomach per abdominal x-ray 9/25  Discussed with RN and on rounds. Plan is to resume tube feeds today. Patient now off propofol gtt.  Diet Order:   Diet Order    None     EDUCATION NEEDS:   No education needs have been identified at this time  Skin:  Skin Assessment: Skin Integrity Issues: (stage 2 bilateral toes (no  comment on which toes))  Last BM:  05/14/2020 - small type 7 per rectal tube  Height:   Ht Readings from Last 1 Encounters:  05/12/20 5\' 10"  (1.778 m)   Weight:   Wt Readings from Last 1 Encounters:  05/15/20 (!) 173.4 kg   Ideal Body Weight:  75.5 kg  BMI:  Body mass index is 54.85 kg/m.  Estimated Nutritional Needs:   Kcal:  2600-2800  Protein:  170-180 grams  Fluid:  >/= 2 L/day  07/15/20, MS, RD, LDN Pager number available on Amion

## 2020-05-15 NOTE — Progress Notes (Signed)
CRITICAL CARE PROGRESS NOTE    Name: Collin Brown MRN: [id number] DOB: 09-20-1985     LOS: 42   SUBJECTIVE FINDINGS & SIGNIFICANT EVENTS    Patient description:  34 yo with OSA, morbid obesity BMI>56 came in with flu like illness diagnosed with COVID19 pneumonia was initially ion Medical floor with hospitalist service initiated Remdesevir and barictitinib, he progressively became more dyspneic he was placed on BIPAP and did not tolerate then placed on HFNC did not tolerate patient himself consented to MV and was intubated in MICU. Mother was notified who is sick at home with [place].   Events:  9/27- patient on 65%FiO2 with proning protocol. CVP trending, goal negative fluid balance.   05/08/20- patient with worsening ventilation increased FiO2 to 100% overnight and had episode of severe bradycardia.  Concern for mucus plugging will perform recruitment maneuvers and possible bronchoscopy.  We were able to perform tracheal aspirate and have weaned down fIO2 to 75% 05/09/20- patient had bradycardic episode and he desaturated overnight with temporary increase to 100FiO2.  Lasix q12 going. I spoke to brother today for update.  His tracheal aspirate came back normal flora so we will stop antibiotic.  05/10/20-patient with pneumomediastinum and on maximal setting on ventilator. Poor prognosis very unfortunate young man. I called and updated Mother today [name].  05/11/20- patient weaned to 80%. Will continue with weaning and proning protocol  05/12/20- patient remains critically ill.  Mother at bedside we discussed his care plan, she is with strong faith and continues to pray for her son to improve.  05/13/20- patient remains crtically ill.  Proning per protocol. No significant events overnight.  05/14/20- Continues to be  febrile, FiO2 up to 100%, improved down to 60% after increasing PEEP from 10 to 16.  05/15/20- patient remains crtically ill, overall status unchanged    Lines/tubes  9/25 - Left IJ placed  : Airway 8 mm (Active)  Secured at (cm) 26 cm 05/07/20 0801  Measured From Lips 05/07/20 [place] 05/07/20 0801  Secured By [employer] 05/07/20 0801  Tube Holder Repositioned Yes 05/07/20 0801  Cuff Pressure (cm H2O) 26 cm H2O 05/07/20 0801  Site Condition Dry 05/07/20 0801     CVC Triple Lumen 05/05/20 Left Internal jugular (Active)  Indication for Insertion or Continuance of Line Vasoactive infusions;Poor Vasculature-patient has had multiple peripheral attempts or PIVs lasting less than 24 hours 05/06/20 2130  Site Assessment Clean;Dry;Intact 05/06/20 2130  Proximal Lumen Status Infusing 05/06/20 2130  Medial Lumen Status Infusing 05/06/20 2130  Distal Lumen Status Infusing 05/06/20 2130  Dressing Type Transparent;Occlusive 05/06/20 2130  Dressing Status Clean;Dry;Intact 05/06/20 2130  Antimicrobial disc in place? Yes 05/06/20 2130  Line Care Connections checked and tightened 05/06/20 2130  Dressing Intervention Dressing reinforced 05/06/20 0900  Dressing Change Due 05/12/20 05/06/20 2130     NG/OG Tube Orog[facility] Center mouth Xray 65 cm (Active)  Cm Marking at Nare/Corner of Mouth (if applicable) 65 cm 01/58/68 2130  Site Assessment Clean;Dry;Intact 05/06/20 2130  Ongoing Placement Verification No change in cm markings or external length of tube from initial placement;No change in respiratory status;No acute changes, not attributed to clinical condition;Xray 05/06/20 2130  Status Clamped 05/06/20 2130     Urethral Catheter Donnald Garre, RN 16 Fr. (Active)  Indication for Insertion or Continuance of Catheter Unstable critically ill patients first 24-48 hours (See Criteria) 05/06/20 2000  Site Assessment Clean;Intact;Prolapse;Swelling 05/06/20 2000  Catheter  Maintenance Bag below  level of bladder;Catheter secured;Drainage bag/tubing not touching floor;Insertion date on drainage bag;No dependent loops;Seal intact 05/06/20 2000  Collection Container Standard drainage bag 05/06/20 2000  Securement Method Securing device (Describe) 05/06/20 2000  Urinary Catheter Interventions (if applicable) Unclamped 82/50/53 2000  Output (mL) 100 mL 05/07/20 0445    Microbiology/Sepsis markers: Results for orders placed or performed during the hospital encounter of 05/03/20  Blood Culture (routine x 2)     Status: None   Collection Time: 05/03/20  4:33 PM   Specimen: BLOOD  Result Value Ref Range Status   Specimen Description BLOOD BLOOD LEFT HAND  Final   Special Requests   Final    BOTTLES DRAWN AEROBIC AND ANAEROBIC Blood Culture results may not be optimal due to an inadequate volume of blood received in culture bottles   Culture   Final    NO GROWTH 5 DAYS Performed at [facility], [address] [place], [place] [address]    Report Status 05/08/2020 FINAL  Final  Blood Culture (routine x 2)     Status: None   Collection Time: 05/03/20  4:33 PM   Specimen: BLOOD  Result Value Ref Range Status   Specimen Description BLOOD BLOOD RIGHT HAND  Final   Special Requests   Final    BOTTLES DRAWN AEROBIC AND ANAEROBIC Blood Culture adequate volume   Culture   Final    NO GROWTH 5 DAYS Performed at [facility], [address] [place], [place] [address]    Report Status 05/08/2020 FINAL  Final  Respiratory Panel by RT PCR (Flu A&B, Covid) -     Status: Abnormal   Collection Time: 05/03/20  7:32 PM  Result Value Ref Range Status   SARS Coronavirus 2 by RT PCR POSITIVE (A) NEGATIVE Final    Comment: RESULT CALLED TO, READ BACK BY AND VERIFIED WITH: Leonides Schanz RN 2114 05/03/20 HNM/AR    Influenza A by PCR NEGATIVE NEGATIVE Final   Influenza B by PCR NEGATIVE NEGATIVE Final    Comment: Performed at [facility], [address] [place], [place] [address]  MRSA PCR Screening     Status: None   Collection Time: 05/06/20 11:51 AM   Specimen: Nasal Mucosa; Nasopharyngeal  Result Value Ref Range Status   MRSA by PCR NEGATIVE NEGATIVE Final    Comment:        The GeneXpert MRSA Assay (FDA approved for NASAL specimens only), is one component of a comprehensive MRSA colonization surveillance program. It is not intended to diagnose MRSA infection nor to guide or monitor treatment for MRSA infections. Performed at [facility], [address] [place], [place] [address]   Culture, respiratory (non-expectorated)     Status: None   Collection Time: 05/07/20 10:55 AM   Specimen: Tracheal Aspirate; Respiratory  Result Value Ref Range Status   Specimen Description   Final    TRACHEAL ASPIRATE Performed at [facility], [address] [place], [place] [address]    Special Requests   Final    NONE Performed at [facility], [place], [place] [address]    Gram Stain   Final    RARE WBC PRESENT,BOTH PMN AND MONONUCLEAR NO ORGANISMS SEEN    Culture   Final    RARE Normal respiratory flora-no Staph aureus or Pseudomonas seen Performed at [facility] [address], [place], [place] [address]    Report Status 05/09/2020 FINAL  Final  CULTURE, BLOOD (ROUTINE X 2) w Reflex to ID Panel     Status: None   Collection Time: 05/10/20 12:47 PM   Specimen: BLOOD  Result Value Ref Range Status   Specimen Description BLOOD BLOOD RIGHT HAND  Final   Special Requests   Final    BOTTLES DRAWN AEROBIC AND ANAEROBIC Blood Culture adequate volume   Culture   Final    NO GROWTH 5 DAYS Performed at [facility], [address] [place], [place] [address]    Report Status 05/15/2020 FINAL  Final  CULTURE, BLOOD (ROUTINE X 2) w Reflex to ID Panel     Status: None   Collection Time: 05/10/20  1:21 PM   Specimen: BLOOD  Result Value Ref Range Status    Specimen Description BLOOD BLOOD RIGHT HAND  Final   Special Requests   Final    BOTTLES DRAWN AEROBIC AND ANAEROBIC Blood Culture adequate volume   Culture   Final    NO GROWTH 5 DAYS Performed at [facility], [address], [place], [place] [address]    Report Status 05/15/2020 FINAL  Final  Culture, respiratory (non-expectorated)     Status: None (Preliminary result)   Collection Time: 05/14/20  8:58 AM   Specimen: Tracheal Aspirate; Respiratory  Result Value Ref Range Status   Specimen Description   Final    TRACHEAL ASPIRATE Performed at [facility], [address] [place], [place] [address]    Special Requests   Final    NONE Performed at [facility], [address] [place], [place] [address]    Gram Stain   Final    RARE WBC PRESENT,BOTH PMN AND MONONUCLEAR NO ORGANISMS SEEN Performed at [facility], [place] [address], [place], [place] [address]    Culture PENDING  Incomplete   Report Status PENDING  Incomplete  CULTURE, BLOOD (ROUTINE X 2) w Reflex to ID Panel     Status: None (Preliminary result)   Collection Time: 05/14/20  9:33 AM   Specimen: BLOOD LEFT HAND  Result Value Ref Range Status   Specimen Description BLOOD LEFT HAND  Final   Special Requests   Final    BOTTLES DRAWN AEROBIC AND ANAEROBIC Blood Culture adequate volume   Culture   Final    NO GROWTH < 24 HOURS Performed at [facility], [address] [place], [place] [address]    Report Status PENDING  Incomplete  CULTURE, BLOOD (ROUTINE X 2) w Reflex to ID Panel     Status: None (Preliminary result)   Collection Time: 05/14/20 10:17 AM   Specimen: BLOOD LEFT HAND  Result Value Ref Range Status   Specimen Description BLOOD LEFT HAND  Final   Special Requests   Final    BOTTLES DRAWN AEROBIC AND ANAEROBIC Blood Culture adequate volume   Culture   Final    NO GROWTH < 24 HOURS Performed at [facility], [address], [place], [place]  [address]    Report Status PENDING  Incomplete    Anti-infectives:  Anti-infectives (From admission, onward)   Start     Dose/Rate Route Frequency Ordered Stop   05/14/20 0600  ceFEPIme (MAXIPIME) 2 g in sodium chloride 0.9 % 100 mL IVPB        2 g 200 mL/hr over 30 Minutes Intravenous Every 8 hours 05/14/20 0533     05/07/20 1200  meropenem (MERREM) 1 g in sodium chloride 0.9 % 100 mL  IVPB  Status:  Discontinued        1 g 200 mL/hr over 30 Minutes Intravenous Every 8 hours 05/07/20 1020 05/09/20 1019   05/06/20 1200  vancomycin (VANCOCIN) IVPB 1000 mg/200 mL premix  Status:  Discontinued        1,000 mg 200 mL/hr over 60 Minutes Intravenous Every 8 hours 05/05/20 2330 05/07/20 1020   05/06/20 0200  vancomycin (VANCOREADY) IVPB 500 mg/100 mL       "Followed by" Linked Group Details   500 mg 100 mL/hr over 60 Minutes Intravenous  Once 05/05/20 2330 05/06/20 0454   05/06/20 0000  vancomycin (VANCOREADY) IVPB 2000 mg/400 mL       "Followed by" Linked Group Details   2,000 mg 200 mL/hr over 120 Minutes Intravenous  Once 05/05/20 2330 05/06/20 0304   05/05/20 2315  cefTRIAXone (ROCEPHIN) 2 g in sodium chloride 0.9 % 100 mL IVPB  Status:  Discontinued        2 g 200 mL/hr over 30 Minutes Intravenous Daily at 10 pm 05/05/20 2307 05/07/20 1020   05/04/20 1000  remdesivir 100 mg in sodium chloride 0.9 % 100 mL IVPB       "Followed by" Linked Group Details   100 mg 200 mL/hr over 30 Minutes Intravenous Daily 05/03/20 1718 05/07/20 0941   05/03/20 1800  remdesivir 200 mg in sodium chloride 0.9% 250 mL IVPB       "Followed by" Linked Group Details   200 mg 580 mL/hr over 30 Minutes Intravenous Once 05/03/20 1718 05/03/20 1941       Consults: Treatment Team:  Pccm, Ander Gaster, MD     PAST MEDICAL HISTORY   Past Medical History:  Diagnosis Date  . Hearing loss in right ear   . Sleep apnea      SURGICAL HISTORY   Past Surgical History:  Procedure Laterality Date  .  TONSILLECTOMY       FAMILY HISTORY   Family History  Problem Relation Age of Onset  . Diabetes Mellitus II Mother   . Hypertension Mother   . Diabetes Mellitus II Father   . High blood pressure Father      SOCIAL HISTORY   Social History   Tobacco Use  . Smoking status: Never Smoker  . Smokeless tobacco: Never Used  Vaping Use  . Vaping Use: Never used  Substance Use Topics  . Alcohol use: No  . Drug use: No     MEDICATIONS   Current Medication:  Current Facility-Administered Medications:  .  acetaminophen (TYLENOL) tablet 1,000 mg, 1,000 mg, Oral, Q6H PRN, Sharion Settler, NP, 1,000 mg at 05/14/20 2203 .  ascorbic acid (VITAMIN C) tablet 500 mg, 500 mg, Per Tube, Daily, Awilda Bill, NP, 500 mg at 05/14/20 0904 .  aspirin chewable tablet 81 mg, 81 mg, Per Tube, Daily, Awilda Bill, NP, 81 mg at 05/14/20 0904 .  ceFEPIme (MAXIPIME) 2 g in sodium chloride 0.9 % 100 mL IVPB, 2 g, Intravenous, Q8H, Hall, Scott A, RPH, Stopped at 05/15/20 0618 .  chlorhexidine gluconate (MEDLINE KIT) (PERIDEX) 0.12 % solution 15 mL, 15 mL, Mouth Rinse, BID, Tyler Pita, MD, 15 mL at 05/14/20 2010 .  Chlorhexidine Gluconate Cloth 2 % PADS 6 each, 6 each, Topical, Daily, Tyler Pita, MD, 6 each at 05/14/20 1335 .  dexamethasone (DECADRON) injection 6 mg, 6 mg, Intravenous, Q24H, Lanney Gins, Fuad, MD, 6 mg at 05/14/20 0916 .  dextrose 50 % solution 0-50  mL, 0-50 mL, Intravenous, PRN, Lanney Gins, Fuad, MD .  docusate (COLACE) 50 MG/5ML liquid 100 mg, 100 mg, Per Tube, BID, Awilda Bill, NP, 100 mg at 05/14/20 2153 .  enoxaparin (LOVENOX) injection 40 mg, 40 mg, Subcutaneous, BID, Para Skeans, MD, 40 mg at 05/14/20 2153 .  famotidine (PEPCID) IVPB 20 mg premix, 20 mg, Intravenous, Q12H, Tyler Pita, MD, Last Rate: 100 mL/hr at 05/14/20 2153, 20 mg at 05/14/20 2153 .  fentaNYL (SUBLIMAZE) bolus via infusion 50 mcg, 50 mcg, Intravenous, Q15 min PRN, Awilda Bill, NP .  fentaNYL (SUBLIMAZE) injection 50 mcg, 50 mcg, Intravenous, Once, Blakeney, Dreama Saa, NP .  fentaNYL 2534mg in NS 2552m(1025mml) infusion-PREMIX, 0-400 mcg/hr, Intravenous, Continuous, ChaBenita GutterPH, Last Rate: 40 mL/hr at 05/15/20 0700, 400 mcg/hr at 05/15/20 0700 .  free water 300 mL, 300 mL, Per Tube, Q4H, BlaAwilda BillP, 300 mL at 05/15/20 0406 .  furosemide (LASIX) injection 80 mg, 80 mg, Intravenous, Daily, Yocelin Vanlue, MD .  ibuprofen (ADVIL) 100 MG/5ML suspension 600 mg, 600 mg, Per NG tube, Q8H PRN, Tukov-Yual, Magdalene S, NP, 600 mg at 05/14/20 0900 .  insulin aspart (novoLOG) injection 0-20 Units, 0-20 Units, Subcutaneous, Q4H, GruDallie PilesPH, 7 Units at 05/15/20 0404 .  insulin detemir (LEVEMIR) injection 20 Units, 20 Units, Subcutaneous, BID, AleOttie GlazierD, 20 Units at 05/14/20 2154 .  iohexol (OMNIPAQUE) 350 MG/ML injection 100 mL, 100 mL, Intravenous, Once PRN, BlaAwilda BillP .  MEDLINE mouth rinse, 15 mL, Mouth Rinse, 10 times per day, GonTyler PitaD, 15 mL at 05/15/20 0550 .  midazolam (VERSED) 50 mg in dextrose 5 % 50 mL (1 mg/mL) infusion, 0.5-15 mg/hr, Intravenous, Continuous, Tukov-Yual, Magdalene S, NP, Last Rate: 12 mL/hr at 05/15/20 0757, 12 mg/hr at 05/15/20 0757 .  multivitamin with minerals tablet 1 tablet, 1 tablet, Per Tube, Daily, AleOttie GlazierD, 1 tablet at 05/14/20 0904 .  norepinephrine (LEVOPHED) 16 mg in 250m44memix infusion, 0-40 mcg/min, Intravenous, Titrated, GrubDallie PilesH .  polyethylene glycol (MIRALAX / GLYCOLAX) packet 17 g, 17 g, Per Tube, Daily, BlakAwilda Bill, 17 g at 05/09/20 0928 .  propofol (DIPRIVAN) 1000 MG/100ML infusion, 5-80 mcg/kg/min, Intravenous, Titrated, BlakAwilda Bill, Stopped at 05/14/20 1511 .  vasopressin (PITRESSIN) 20 Units in sodium chloride 0.9 % 100 mL infusion-*FOR SHOCK*, 0-0.04 Units/min, Intravenous, Continuous, Blakeney, Dana G, NP, Last Rate: 9 mL/hr  at 05/15/20 0700, 0.03 Units/min at 05/15/20 0700 .  vecuronium (NORCURON) injection 10 mg, 10 mg, Intravenous, Q1H PRN, BlakAwilda Bill, 10 mg at 05/15/20 0514 .  zinc sulfate capsule 220 mg, 220 mg, Per Tube, Daily, BlakAwilda Bill, 220 mg at 05/14/20 [id number]   Patient has no known allergies.    REVIEW OF SYSTEMS    10 point ROS unable to perform due to sedation on MV  PHYSICAL EXAMINATION   Vital Signs: Temp:  [99.2 F (37.3 C)-102.3 F (39.1 C)] 99.2 F (37.3 C) (10/05 0400) Pulse Rate:  [66-103] 66 (10/05 0700) Resp:  [22-35] 35 (10/05 0700) BP: (79-124)/(55-82) 111/75 (10/05 0700) SpO2:  [92 %-100 %] 97 % (10/05 0700) FiO2 (%):  [55 %-100 %] 55 % (10/05 0738) Weight:  [173.4 kg] 173.4 kg (10/05 0417)  GENERAL:NAD HEAD: Normocephalic, atraumatic.  EYES: Pupils equal, round, reactive to light.  No scleral icterus.  MOUTH: Moist mucosal membrane. NECK: Supple.  No thyromegaly. No nodules. No JVD.  PULMONARY: rhonchi bilaterally  CARDIOVASCULAR: S1 and S2. Regular rate and rhythm. No murmurs, rubs, or gallops.  GASTROINTESTINAL: Soft, nontender, non-distended. No masses. Positive bowel sounds. No hepatosplenomegaly.  MUSCULOSKELETAL: No swelling, clubbing, or edema.  NEUROLOGIC: Mild distress due to acute illness SKIN:intact,warm,dry   PERTINENT DATA     Infusions: . ceFEPime (MAXIPIME) IV Stopped (05/15/20 0618)  . famotidine (PEPCID) IV 20 mg (05/14/20 2153)  . fentaNYL infusion INTRAVENOUS 400 mcg/hr (05/15/20 0700)  . midazolam 12 mg/hr (05/15/20 0757)  . norepinephrine (LEVOPHED) Adult infusion    . propofol (DIPRIVAN) infusion Stopped (05/14/20 1511)  . vasopressin 0.03 Units/min (05/15/20 0700)   Scheduled Medications: . vitamin C  500 mg Per Tube Daily  . aspirin  81 mg Per Tube Daily  . chlorhexidine gluconate (MEDLINE KIT)  15 mL Mouth Rinse BID  . Chlorhexidine Gluconate Cloth  6 each Topical Daily  . dexamethasone  (DECADRON) injection  6 mg Intravenous Q24H  . docusate  100 mg Per Tube BID  . enoxaparin (LOVENOX) injection  40 mg Subcutaneous BID  . fentaNYL (SUBLIMAZE) injection  50 mcg Intravenous Once  . free water  300 mL Per Tube Q4H  . furosemide  80 mg Intravenous Daily  . insulin aspart  0-20 Units Subcutaneous Q4H  . insulin detemir  20 Units Subcutaneous BID  . mouth rinse  15 mL Mouth Rinse 10 times per day  . multivitamin with minerals  1 tablet Per Tube Daily  . polyethylene glycol  17 g Per Tube Daily  . zinc sulfate  220 mg Per Tube Daily   PRN Medications: acetaminophen, dextrose, fentaNYL, ibuprofen, iohexol, vecuronium Hemodynamic parameters:   Intake/Output: 10/04 0701 - 10/05 0700 In: 6319.4 [I.V.:1640.9; NG/GT:4260; IV Piggyback:418.5] Out: 1950 [TOIZT:2458]  Ventilator  Settings: Vent Mode: PRVC FiO2 (%):  [55 %-100 %] 55 % Set Rate:  [35 bmp] 35 bmp Vt Set:  [460 mL] 460 mL PEEP:  [10 cmH20-16 cmH20] 16 cmH20 Plateau Pressure:  [64 cmH20] 64 cmH20   Other Labs:     LAB RESULTS:  Basic Metabolic Panel: Recent Labs  Lab 05/10/20 0402 05/10/20 1700 05/11/20 0414 05/11/20 0414 05/12/20 0325 05/12/20 0325 05/13/20 0400 05/13/20 0400 05/14/20 0523 05/15/20 0415  NA 152*  --  153*  --  152*  --  153*  --  153* 145  K 4.2   < > 4.3   < > 3.8   < > 4.2   < > 3.6 3.6  CL 105  --  101  --  101  --  99  --  97* 95*  CO2 40*  --  37*  --  44*  --  41*  --  45* 38*  GLUCOSE 181*  --  158*  --  132*  --  232*  --  145* 245*  BUN 52*  --  44*  --  46*  --  47*  --  55* 70*  CREATININE 1.01  --  0.84  --  1.04  --  1.06  --  1.08 0.99  CALCIUM 8.0*  --  8.2*  --  8.7*  --  8.5*  --  8.8* 8.5*  MG 3.2*  --   --   --   --   --  2.9*  --   --  3.0*  PHOS 5.7*  --  3.4  --  2.6  --  4.5  --  3.8 3.6   < > =  values in this interval not displayed.   Liver Function Tests: Recent Labs  Lab 05/09/20 0430 05/10/20 0402 05/11/20 0414 05/12/20 0325  AST 25 43* 49*  134*  ALT 37 52* 54* 74*  ALKPHOS 44 41 38 41  BILITOT 0.8 1.0 1.2 1.0  PROT 5.6* 5.8* 5.9* 6.0*  ALBUMIN 3.1* 3.1* 2.9* 2.8*   No results for input(s): LIPASE, AMYLASE in the last 168 hours. No results for input(s): AMMONIA in the last 168 hours. CBC: Recent Labs  Lab 05/11/20 0414 05/12/20 0325 05/13/20 0400 05/14/20 0523 05/15/20 0415  WBC 7.1 11.5* 15.5* 13.2* 13.1*  NEUTROABS 5.9 9.4* 12.1* 9.6* 10.2*  HGB 11.4* 11.3* 12.3* 11.1* 9.9*  HCT 39.3 38.6* 41.2 37.9* 33.2*  MCV 93.1 92.6 92.2 94.0 91.0  PLT 158 195 249 220 218   Cardiac Enzymes: No results for input(s): CKTOTAL, CKMB, CKMBINDEX, TROPONINI in the last 168 hours. BNP: Invalid input(s): POCBNP CBG: Recent Labs  Lab 05/14/20 1540 05/14/20 2002 05/14/20 2330 05/15/20 0354 05/15/20 0745  GLUCAP 310* 307* 237* 211* 227*       IMAGING RESULTS:  Imaging: DG Chest Port 1 View  Result Date: 05/14/2020 CLINICAL DATA:  Acute respiratory failure.  COVID positive. EXAM: PORTABLE CHEST 1 VIEW COMPARISON:  05/11/2020 FINDINGS: 0443 hours. Endotracheal tube tip is 5.1 cm above the base of the carina. The NG tube passes into the stomach although the distal tip position is not included on the film. Cardiopericardial silhouette is at upper limits of normal for size. Diffuse bilateral airspace disease is similar to prior. Left lung apex not included on the film. Subcutaneous emphysema seen in the supraclavicular regions and left chest wall previously has decreased. IMPRESSION: No substantial interval change in the diffuse bilateral airspace disease. Electronically Signed   By: [name] M.D.   On: 05/14/2020 07:36   '@PROBHOSP' @ No results found.  ASSESSMENT AND PLAN    -Multidisciplinary rounds held today  Acute hypoxic respiratory failure due to COVID-19 ARDS COVID-19 pneumonia Underlying morbid obesity Mechanical intubation  Ventilator associated pneumonia Full vent support for now-vent settings reviewed  and established  Mechanical ventilation via ARDS protocol, target PRVC 6 cc/kg Goal plateau pressure less than 30, driving pressure less than 15 SBT once all parameters met  VAP bundle implemented  Maintain O2 sats 88% or higher Remdesivir therapy complete Continue steroids switched to Decadron 10 mg twice daily>>6 daily -05/08/20 Discontinue baricitinib due to leukopenia and lack of efficacy Follow inflammatory markers Trend WBC and monitor fever curves  - continue proning and weaning per protocol  - Concern for ventilator associated pneumonia given increasing fevers and elevated WBCs on 10/4. Cultures obtained and Cefepime started (no Vanc as MRSA swab was negative). If cultures negative at 48 hours, can consider discontinuing Cefepime, otherwise 7 day course ending 10/10    ID -continue IV abx as prescibed -follow up cultures  GI/Nutrition GI PROPHYLAXIS as indicated DIET-->TF's as tolerated Constipation protocol as indicated  ENDO - ICU hypoglycemic\Hyperglycemia protocol -check FSBS per protocol   ELECTROLYTES -follow labs as needed -replace as needed -pharmacy consultation   DVT/GI PRX ordered -SCDs  TRANSFUSIONS AS NEEDED MONITOR FSBS ASSESS the need for LABS as needed   Critical care provider statement:    Critical care time (minutes):  33   Critical care time was exclusive of:  Separately billable procedures and treating other patients   Critical care was necessary to treat or prevent imminent or life-threatening deterioration of the following conditions:  ARDS due to [place] pneumonia   Critical care was time spent personally by me on the following activities:  Development of treatment plan with patient or surrogate, discussions with consultants, evaluation of patient's response to treatment, examination of patient, obtaining history from patient or surrogate, ordering and performing treatments and interventions, ordering and review of laboratory studies and  re-evaluation of patient's condition.  I assumed direction of critical care for this patient from another provider in my specialty: no    This document was prepared using Dragon voice recognition software and may include unintentional dictation errors.   Jorje Guild, M.D., M.P.H.  Pulmonary & Critical Care Medicine

## 2020-05-15 NOTE — Progress Notes (Signed)
Remains on pressors and ventilator support, with sedation titrated as tolerated. Paralytic given prn to for vent dysynchrony. Tube feedings restarted this shift as ordered, tol well thus far. CBG's elevated MD aware, new orders implemented this shift.

## 2020-05-15 NOTE — Progress Notes (Signed)
Patient changed from Flow trigger to pressure trigger of -2cmH2O due to what looked like double triggering despite patient being paralyzed. The change was successful the fixing the double triggering.

## 2020-05-15 NOTE — Consult Note (Signed)
PHARMACY CONSULT NOTE  Pharmacy Consult for Electrolyte Monitoring and Replacement   Recent Labs: Potassium (mmol/L)  Date Value  05/15/2020 3.6   Magnesium (mg/dL)  Date Value  59/29/2446 3.0 (H)   Calcium (mg/dL)  Date Value  28/63/8177 8.5 (L)   Albumin (g/dL)  Date Value  11/65/7903 2.8 (L)   Phosphorus (mg/dL)  Date Value  83/33/8329 3.6   Sodium (mmol/L)  Date Value  05/15/2020 145   Corrected Calcium: 9.8 mg/dL  Assessment: Pharmacy has been consulted for electrolyte replacement in this 34 YOM requiring intubation and admission to the CCU for COVID PNA. Treatment including IV decadron, leading to the need for high amounts of insulin and eventually an insulin drip that was subsequently transitioned to subcutaneous insulin.  Free water: 300 mL every 4 hours  Goal of Therapy:  Electrolytes WNL  Plan:   No electrolyte replacement at this time follow up in the morning  Follow-up electrolytes in am  Lowella Bandy, PharmD 05/15/2020 7:07 AM

## 2020-05-15 NOTE — Progress Notes (Signed)
Inpatient Diabetes Program Recommendations  AACE/ADA: New Consensus Statement on Inpatient Glycemic Control   Target Ranges:  Prepandial:   less than 140 mg/dL      Peak postprandial:   less than 180 mg/dL (1-2 hours)      Critically ill patients:  140 - 180 mg/dL   Results for ZORIAN, GUNDERMAN (MRN [id number]) as of 05/15/2020 09:23  Ref. Range 05/14/2020 07:27 05/14/2020 11:12 05/14/2020 15:40 05/14/2020 20:02 05/14/2020 23:30 05/15/2020 03:54 05/15/2020 07:45  Glucose-Capillary Latest Ref Range: 70 - 99 mg/dL 491 (H) 791 (H) 505 (H) 307 (H) 237 (H) 211 (H) 227 (H)   Review of Glycemic Control  Diabetes history: No Outpatient Diabetes medications: NA Current orders for Inpatient glycemic control: Levemir 20 units BID, Novolog 0-20 units Q4H; Decadron 6 mg Q24H  Inpatient Diabetes Program Recommendations:    Insulin: If steroids continued as ordered, please consider increasing Levemir to 35 units BID. Patient has already received Levemir 20 units this morning so please also order time Levemir 15 units x1 now.  Thanks, Orlando Penner, RN, MSN, CDE Diabetes Coordinator Inpatient Diabetes Program [phone] (Team Pager from 8am to 5pm)

## 2020-05-16 ENCOUNTER — Inpatient Hospital Stay: Payer: BC Managed Care – PPO

## 2020-05-16 DIAGNOSIS — J9601 Acute respiratory failure with hypoxia: Secondary | ICD-10-CM | POA: Diagnosis not present

## 2020-05-16 DIAGNOSIS — U071 COVID-19: Secondary | ICD-10-CM | POA: Diagnosis not present

## 2020-05-16 LAB — BLOOD GAS, ARTERIAL
Acid-Base Excess: 19.9 mmol/L — ABNORMAL HIGH (ref 0.0–2.0)
Bicarbonate: 46.6 mmol/L — ABNORMAL HIGH (ref 20.0–28.0)
FIO2: 0.5
MECHVT: 460 mL
O2 Saturation: 94.6 %
PEEP: 15 cmH2O
Patient temperature: 37
RATE: 34 resp/min
pCO2 arterial: 64 mmHg — ABNORMAL HIGH (ref 32.0–48.0)
pH, Arterial: 7.47 — ABNORMAL HIGH (ref 7.350–7.450)
pO2, Arterial: 69 mmHg — ABNORMAL LOW (ref 83.0–108.0)

## 2020-05-16 LAB — CBC WITH DIFFERENTIAL/PLATELET
Abs Immature Granulocytes: 0.28 10*3/uL — ABNORMAL HIGH (ref 0.00–0.07)
Basophils Absolute: 0 10*3/uL (ref 0.0–0.1)
Basophils Relative: 0 %
Eosinophils Absolute: 0 10*3/uL (ref 0.0–0.5)
Eosinophils Relative: 0 %
HCT: 32.4 % — ABNORMAL LOW (ref 39.0–52.0)
Hemoglobin: 9.8 g/dL — ABNORMAL LOW (ref 13.0–17.0)
Immature Granulocytes: 2 %
Lymphocytes Relative: 11 %
Lymphs Abs: 1.5 10*3/uL (ref 0.7–4.0)
MCH: 27.2 pg (ref 26.0–34.0)
MCHC: 30.2 g/dL (ref 30.0–36.0)
MCV: 90 fL (ref 80.0–100.0)
Monocytes Absolute: 0.9 10*3/uL (ref 0.1–1.0)
Monocytes Relative: 6 %
Neutro Abs: 11.5 10*3/uL — ABNORMAL HIGH (ref 1.7–7.7)
Neutrophils Relative %: 81 %
Platelets: 252 10*3/uL (ref 150–400)
RBC: 3.6 MIL/uL — ABNORMAL LOW (ref 4.22–5.81)
RDW: 16.1 % — ABNORMAL HIGH (ref 11.5–15.5)
WBC: 14.3 10*3/uL — ABNORMAL HIGH (ref 4.0–10.5)
nRBC: 0.3 % — ABNORMAL HIGH (ref 0.0–0.2)

## 2020-05-16 LAB — BLOOD GAS, VENOUS
Acid-Base Excess: 18.9 mmol/L — ABNORMAL HIGH (ref 0.0–2.0)
Bicarbonate: 47.2 mmol/L — ABNORMAL HIGH (ref 20.0–28.0)
FIO2: 0.55
MECHVT: 460 mL
O2 Saturation: 86.7 %
PEEP: 16 cmH2O
Patient temperature: 37
RATE: 35 resp/min
pCO2, Ven: 78 mmHg (ref 44.0–60.0)
pH, Ven: 7.39 (ref 7.250–7.430)
pO2, Ven: 53 mmHg — ABNORMAL HIGH (ref 32.0–45.0)

## 2020-05-16 LAB — BASIC METABOLIC PANEL
Anion gap: 10 (ref 5–15)
BUN: 70 mg/dL — ABNORMAL HIGH (ref 6–20)
CO2: 40 mmol/L — ABNORMAL HIGH (ref 22–32)
Calcium: 8.8 mg/dL — ABNORMAL LOW (ref 8.9–10.3)
Chloride: 94 mmol/L — ABNORMAL LOW (ref 98–111)
Creatinine, Ser: 0.98 mg/dL (ref 0.61–1.24)
GFR calc non Af Amer: >60 mL/min (ref >60)
Glucose, Bld: 300 mg/dL — ABNORMAL HIGH (ref 70–99)
Potassium: 3.8 mmol/L (ref 3.5–5.1)
Sodium: 144 mmol/L (ref 135–145)

## 2020-05-16 LAB — SEDIMENTATION RATE: Sed Rate: 85 mm/hr — ABNORMAL HIGH (ref 0–15)

## 2020-05-16 LAB — CULTURE, RESPIRATORY W GRAM STAIN: Culture: NORMAL

## 2020-05-16 LAB — GLUCOSE, CAPILLARY
Glucose-Capillary: 260 mg/dL — ABNORMAL HIGH (ref 70–99)
Glucose-Capillary: 294 mg/dL — ABNORMAL HIGH (ref 70–99)
Glucose-Capillary: 357 mg/dL — ABNORMAL HIGH (ref 70–99)
Glucose-Capillary: 171 mg/dL — ABNORMAL HIGH (ref 70–99)
Glucose-Capillary: 256 mg/dL — ABNORMAL HIGH (ref 70–99)
Glucose-Capillary: 328 mg/dL — ABNORMAL HIGH (ref 70–99)

## 2020-05-16 LAB — C-REACTIVE PROTEIN: CRP: 3.7 mg/dL — ABNORMAL HIGH (ref <1.0)

## 2020-05-16 LAB — MAGNESIUM: Magnesium: 2.8 mg/dL — ABNORMAL HIGH (ref 1.7–2.4)

## 2020-05-16 LAB — PROCALCITONIN: Procalcitonin: 0.65 ng/mL

## 2020-05-16 LAB — BRAIN NATRIURETIC PEPTIDE: B Natriuretic Peptide: 77.7 pg/mL (ref 0.0–100.0)

## 2020-05-16 LAB — FERRITIN: Ferritin: 769 ng/mL — ABNORMAL HIGH (ref 24–336)

## 2020-05-16 LAB — FIBRIN DERIVATIVES D-DIMER (ARMC ONLY): Fibrin derivatives D-dimer (ARMC): 788.64 ng/mL (FEU) — ABNORMAL HIGH (ref 0.00–499.00)

## 2020-05-16 LAB — PHOSPHORUS: Phosphorus: 3.2 mg/dL (ref 2.5–4.6)

## 2020-05-16 IMAGING — CT CT HEAD W/O CM
2 series · 15 of 30 positions shown, 19 images · non-contrast
Comparison: None.

CLINICAL DATA: Delirium. Coronavirus infection with ventilator
support.

EXAM:
CT HEAD WITHOUT CONTRAST
TECHNIQUE: Contiguous axial images were obtained from the base of the skull
through the vertex without intravenous contrast.

[Series 4: head wo · axial · 0.45mm/px · z∈[-162,-12]mm · 13 of 36 slices shown, 17 images]
[im 3/36  brain]
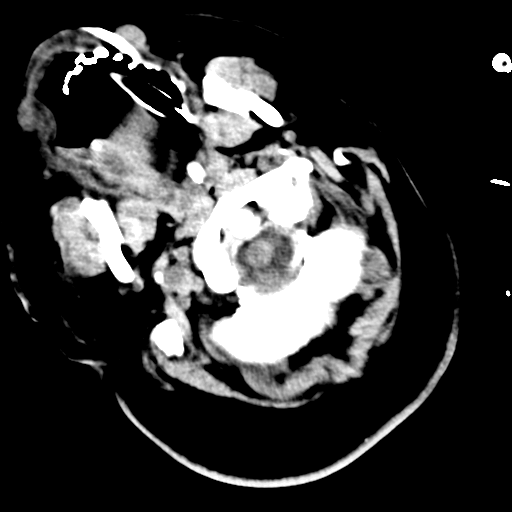
[im 3/36  bone]
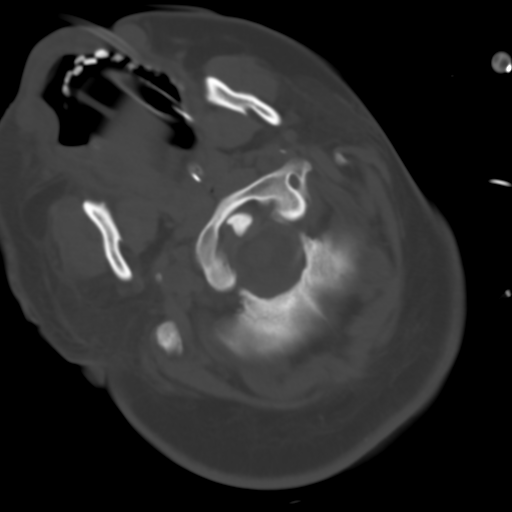
[im 6/36  brain]
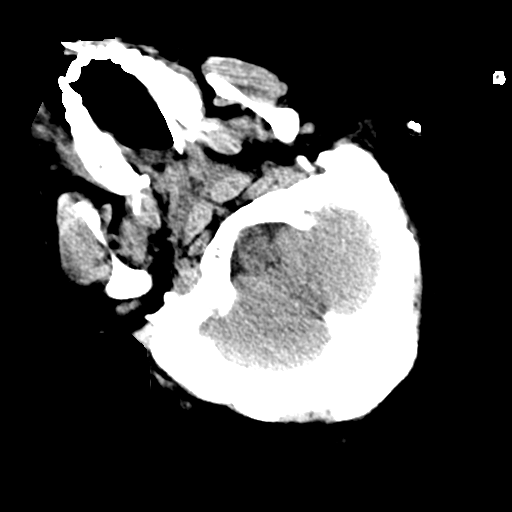
[im 8/36  brain]
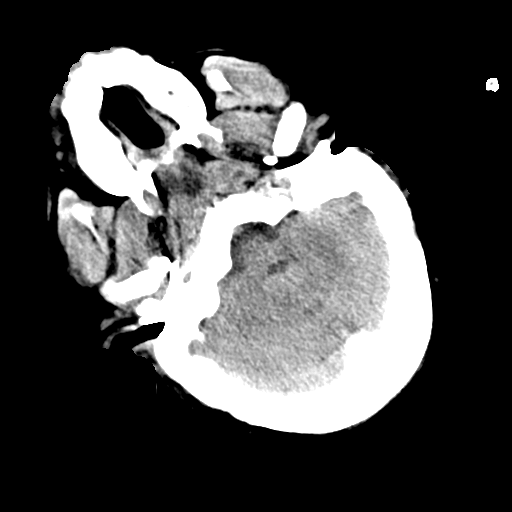
[im 11/36  brain]
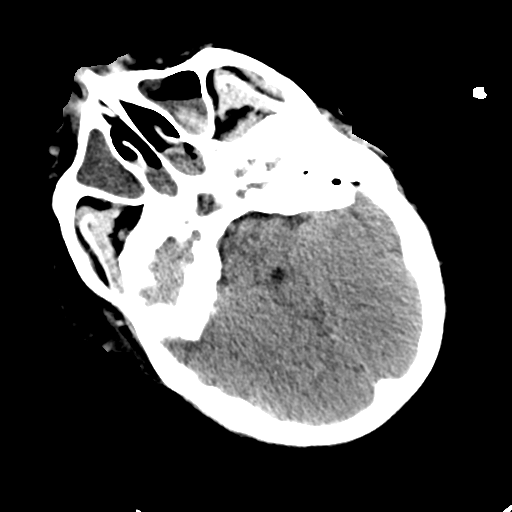
[im 13/36  brain]
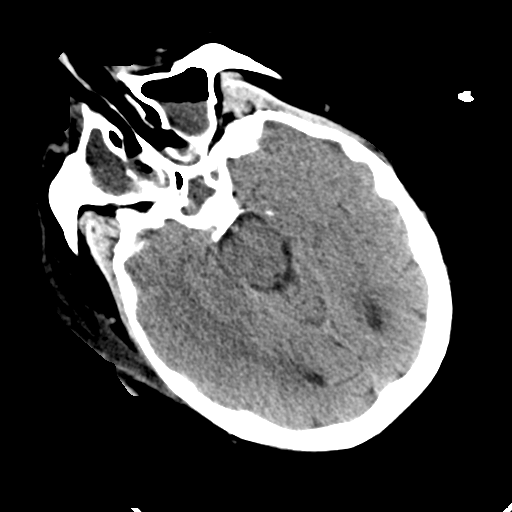
[im 13/36  bone]
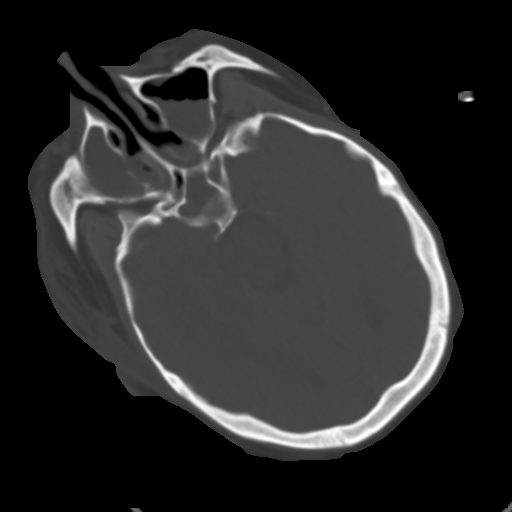
[im 16/36  brain]
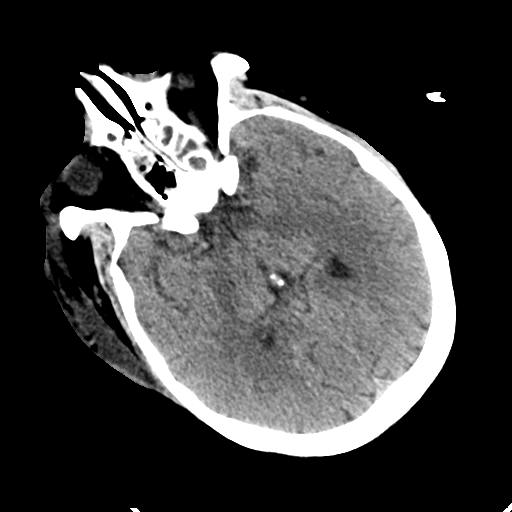
[im 18/36  brain]
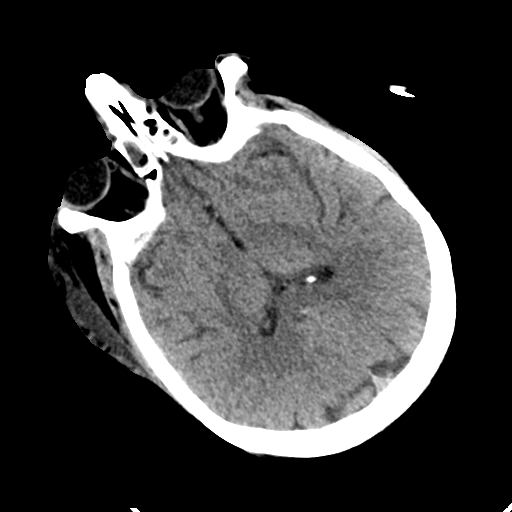
[im 21/36  brain]
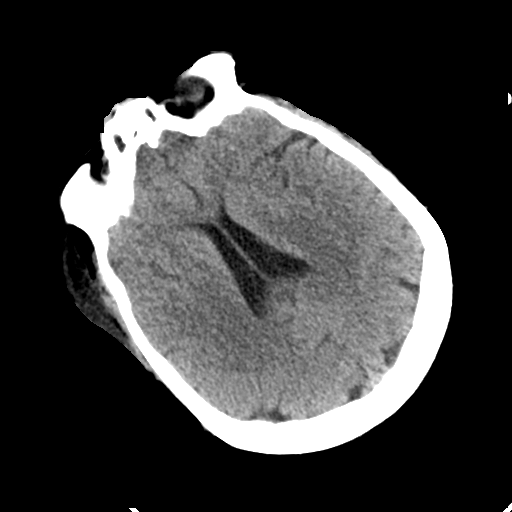
[im 23/36  brain]
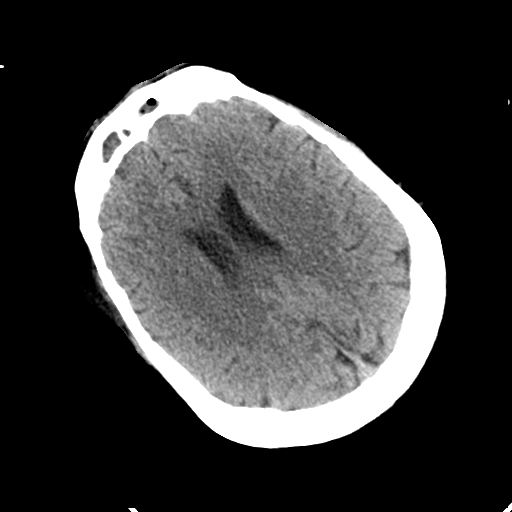
[im 23/36  bone]
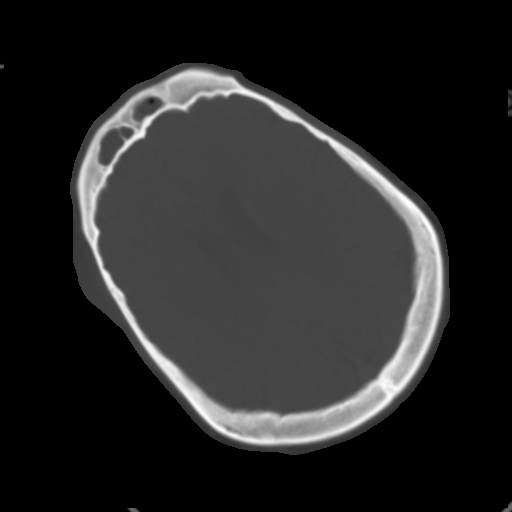
[im 26/36  brain]
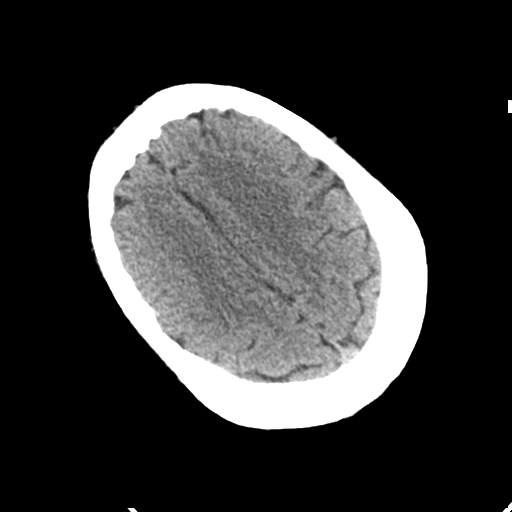
[im 28/36  brain]
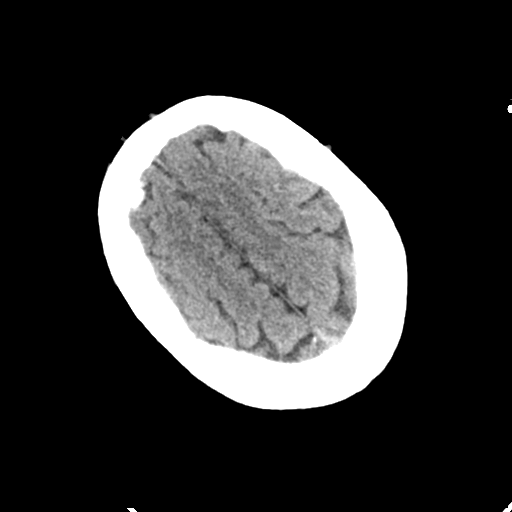
[im 31/36  brain]
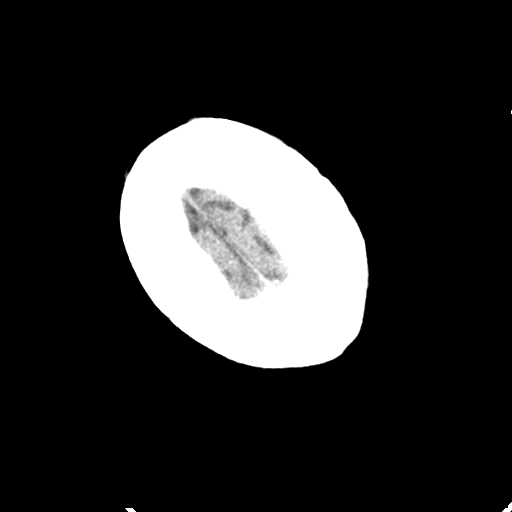
[im 33/36  brain]
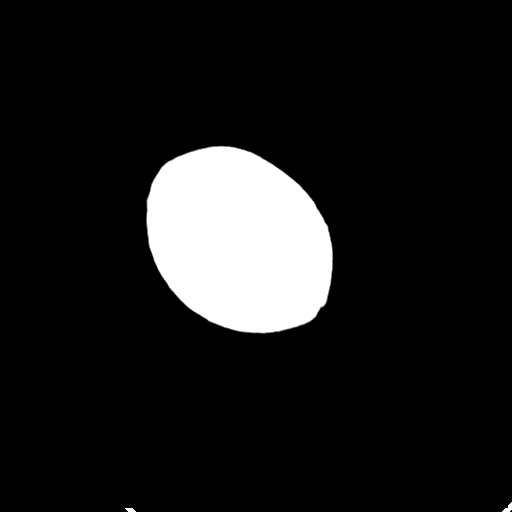
[im 33/36  bone]
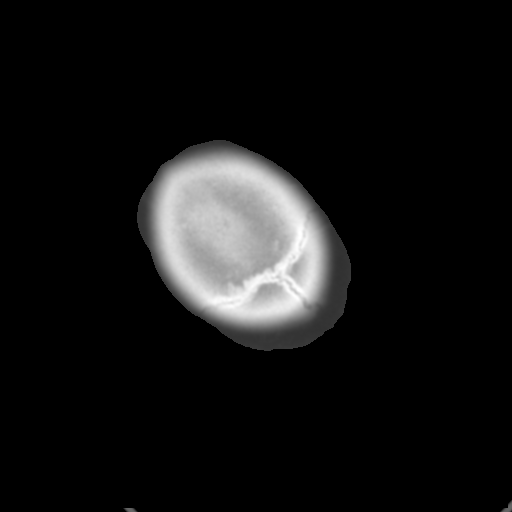

[Series 7: ax head wo · axial · 0.39mm/px · z∈[-131,-107]mm · 2 of 38 slices shown]
[im 3/38  brain]
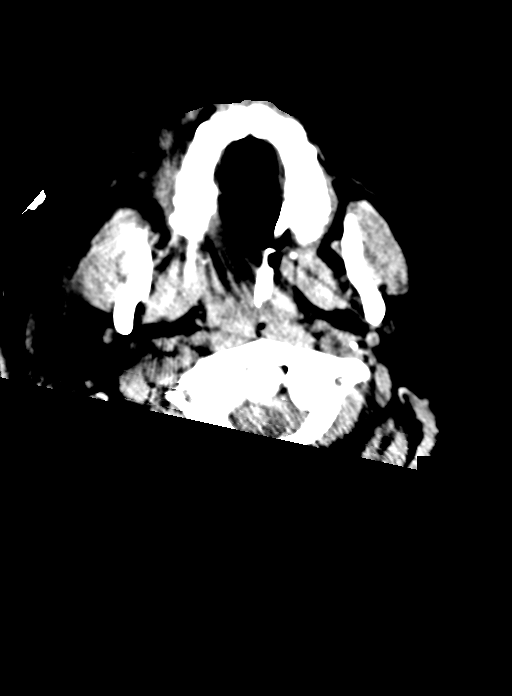
[im 8/38  brain]
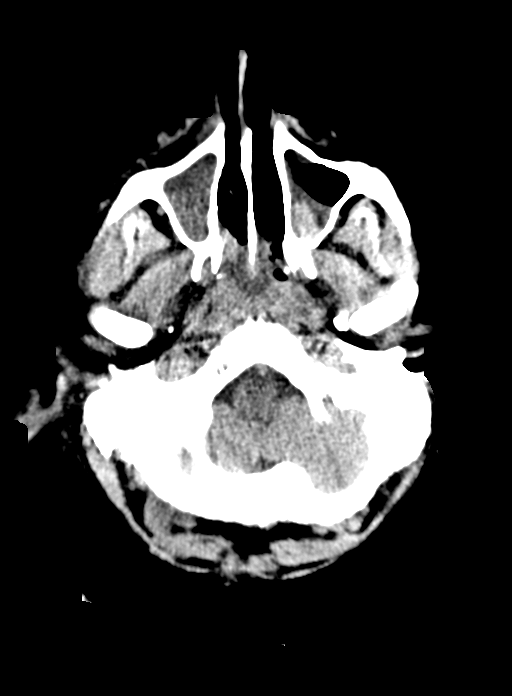

[15 of 30 positions shown; findings below may reference images not displayed]

FINDINGS: Brain: The brain shows a normal appearance without evidence of
malformation, atrophy, old or acute small or large vessel
infarction, mass lesion, hemorrhage, hydrocephalus or extra-axial
collection.

Vascular: No hyperdense vessel. No evidence of atherosclerotic
calcification.

Skull: Normal.  No traumatic finding.  No focal bone lesion.

Sinuses/Orbits: Opacification of the paranasal sinuses, often seen
with chronic ventilator support.

Other: None significant
IMPRESSION: 1. Normal head CT.
2. Opacification of the paranasal sinuses, often seen with chronic
ventilator support.

## 2020-05-16 IMAGING — CT CT ANGIO CHEST
1 of 6 series · 19 of 36 positions shown · IV contrast (APPLIED)
Comparison: Radiography [DATE]

CLINICAL DATA: Coronavirus infection. Ventilator support. Assess
for pulmonary emboli.

EXAM:
CT ANGIOGRAPHY CHEST WITH CONTRAST
TECHNIQUE: Multidetector CT imaging of the chest was performed using the
standard protocol during bolus administration of intravenous
contrast. Multiplanar CT image reconstructions and MIPs were
obtained to evaluate the vascular anatomy.
CONTRAST:  75mL OMNIPAQUE IOHEXOL 350 MG/ML SOLN

[Series 8: thins · axial · 0.80mm/px · z∈[-558,-308]mm · 19 of 278 slices shown]
[im 14/278  lung]
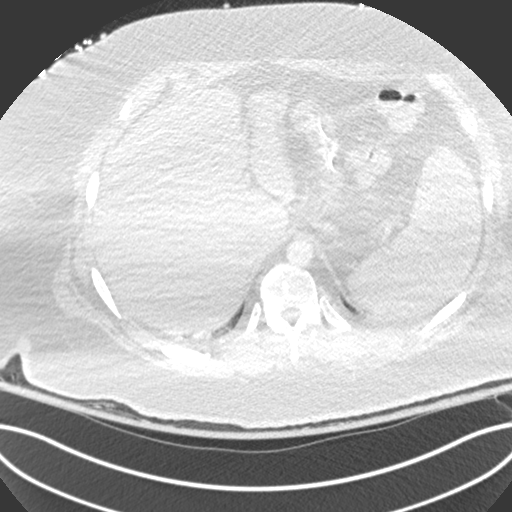
[im 28/278  mediastinal]
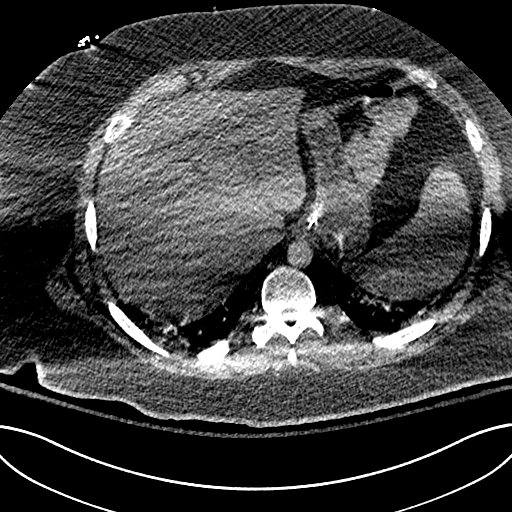
[im 42/278  lung]
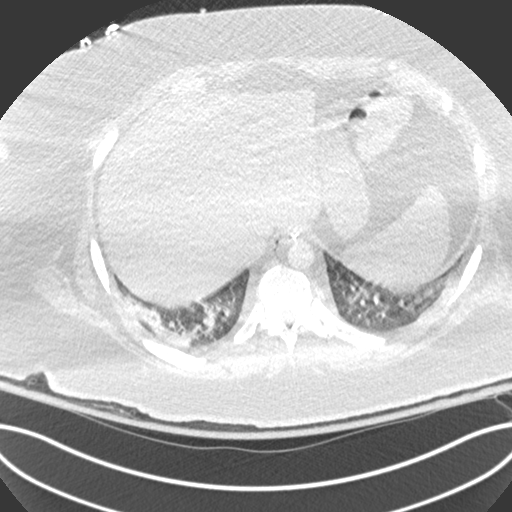
[im 56/278  mediastinal]
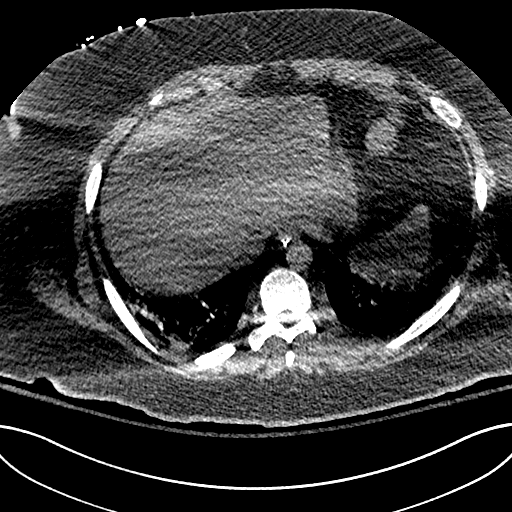
[im 70/278  lung]
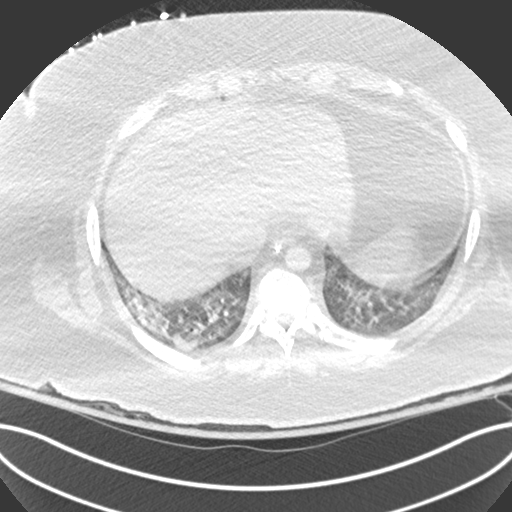
[im 84/278  mediastinal]
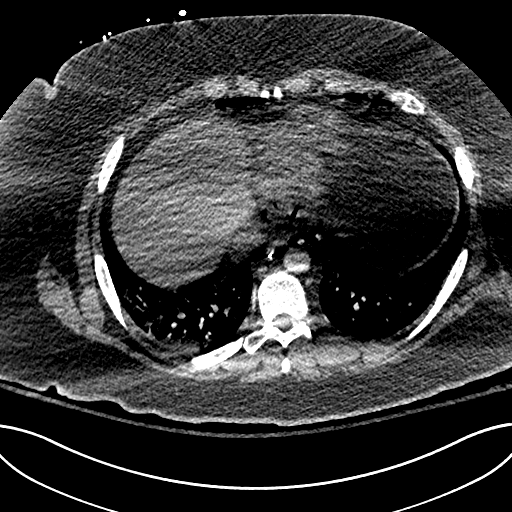
[im 97/278  lung]
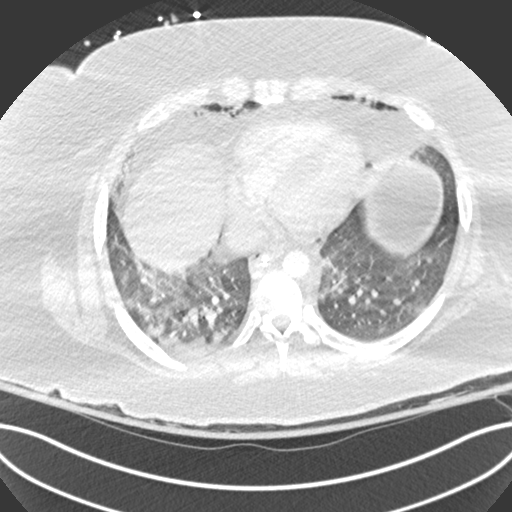
[im 111/278  mediastinal]
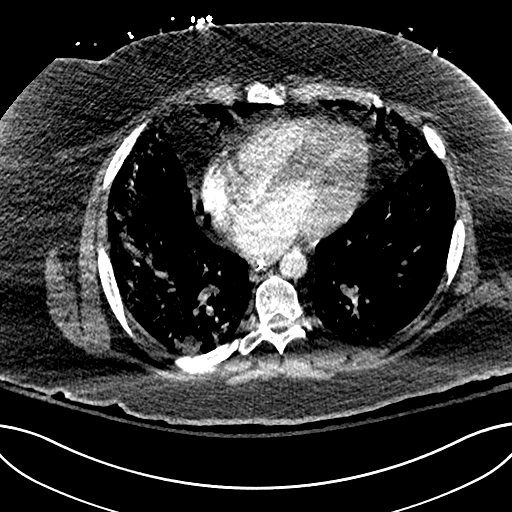
[im 125/278  lung]
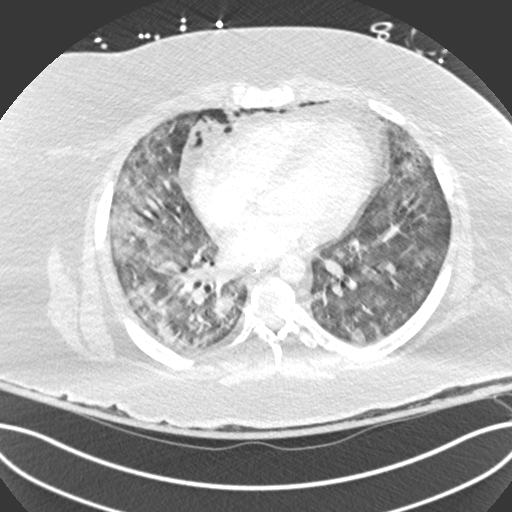
[im 139/278  mediastinal]
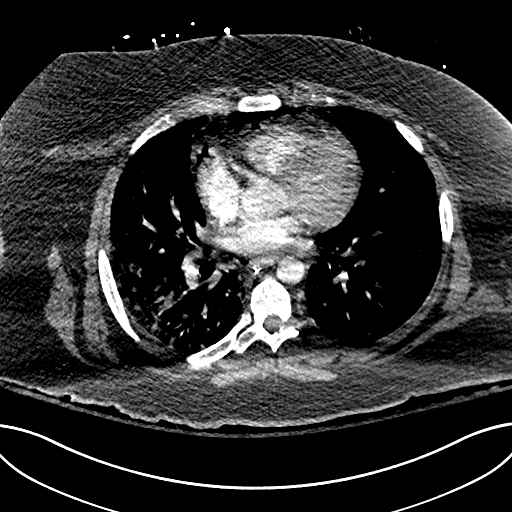
[im 153/278  lung]
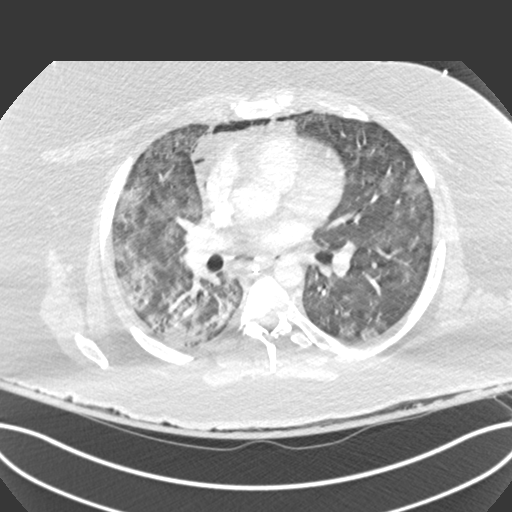
[im 167/278  mediastinal]
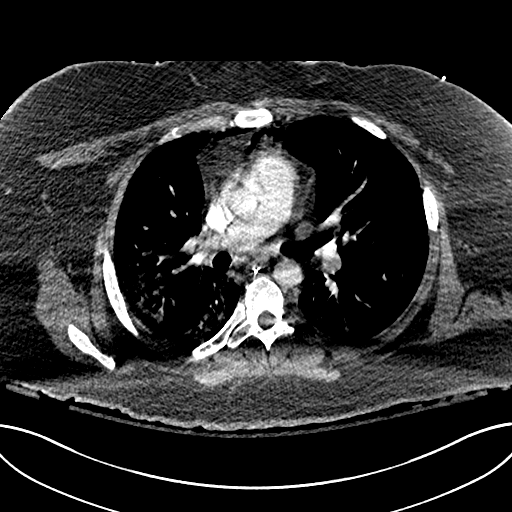
[im 181/278  lung]
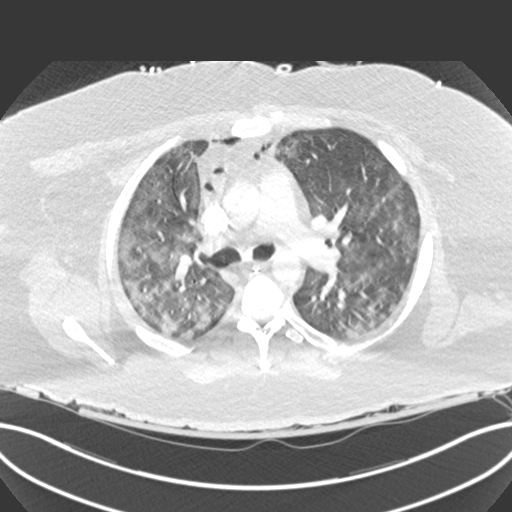
[im 194/278  mediastinal]
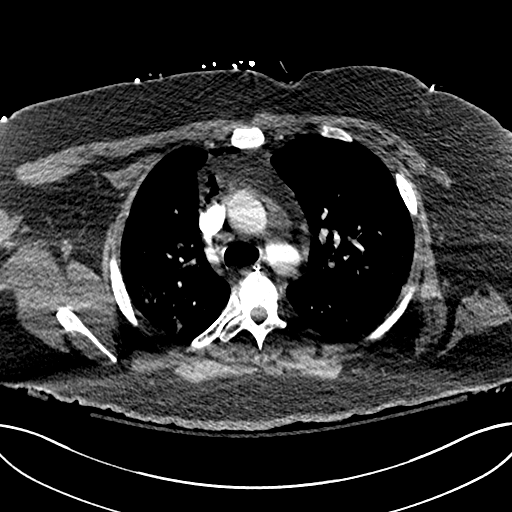
[im 208/278  lung]
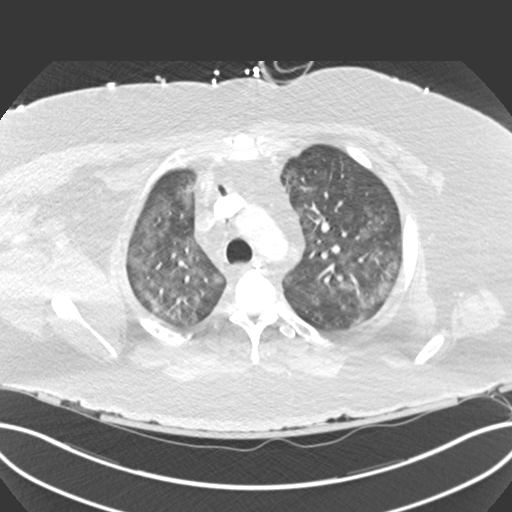
[im 222/278  mediastinal]
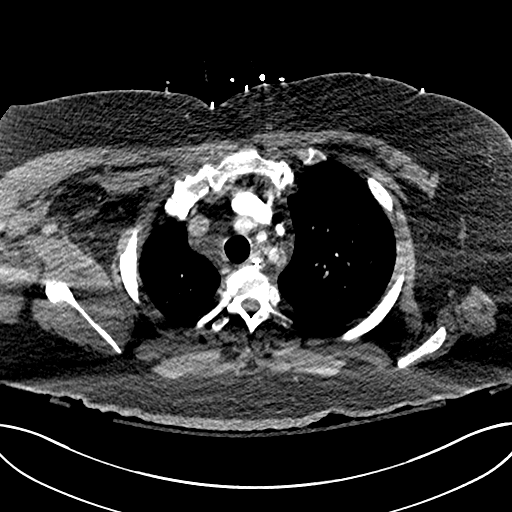
[im 236/278  lung]
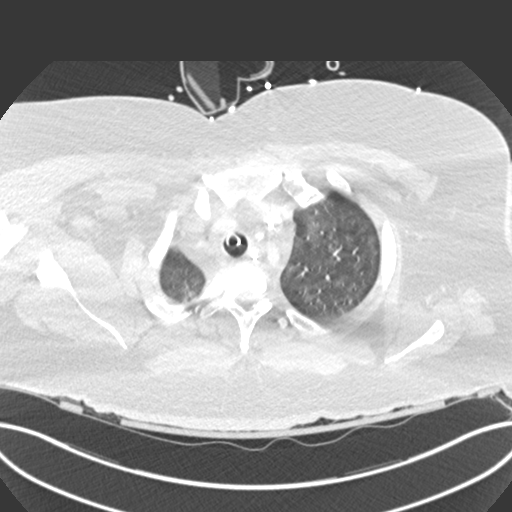
[im 250/278  mediastinal]
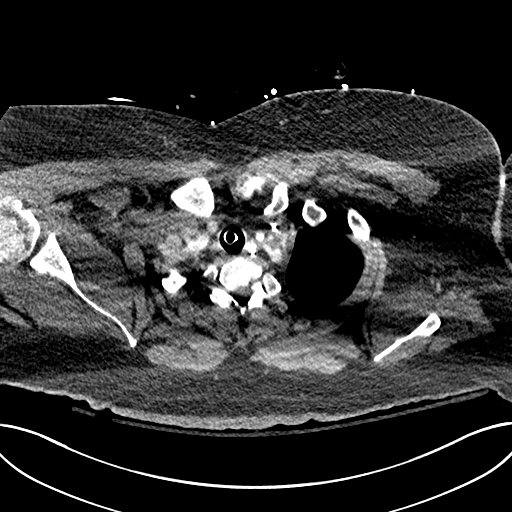
[im 264/278  lung]
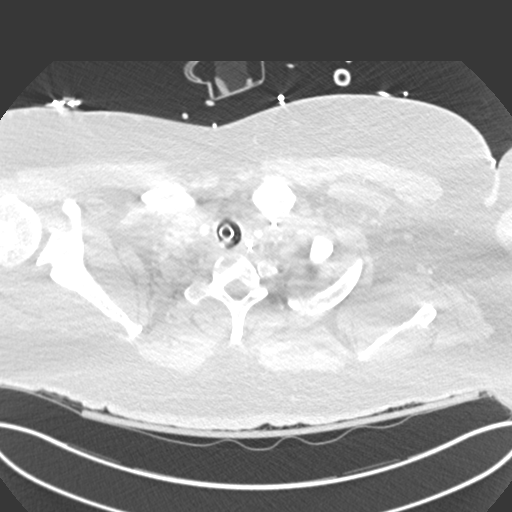

[19 of 36 positions shown; findings below may reference images not displayed]

FINDINGS: Cardiovascular: Pulmonary arterial opacification is poor. I do not
see any pulmonary emboli, but anything other than large central
emboli would be inapparent. Heart size is normal. No pericardial
effusion. No acute aortic pathology.

Mediastinum/Nodes: Pneumomediastinum of a mild degree.

Lungs/Pleura: Widespread bilateral patchy pulmonary infiltrates
without dense lobar consolidation or collapse. No effusions.

Upper Abdomen: Negative

Musculoskeletal: Negative

Review of the MIP images confirms the above findings.
IMPRESSION: 1. Pneumomediastinum of a mild degree.
2. Pulmonary arterial opacification is poor. I do not see any
pulmonary emboli, but anything other than large central emboli would
be inapparent.
3. Widespread bilateral patchy pulmonary infiltrates consistent with
viral pneumonia. No dense lobar consolidation or collapse.

## 2020-05-16 MED ORDER — DEXTROSE 5 % IV SOLN
15.0000 mg/kg/h | INTRAVENOUS | Status: DC
  Administered 2020-05-16 – 2020-05-17 (×3): 15 mg/kg/h via INTRAVENOUS
  Filled 2020-05-16 (×4): qty 210

## 2020-05-16 MED ORDER — INSULIN DETEMIR 100 UNIT/ML ~~LOC~~ SOLN
40.0000 [IU] | Freq: Two times a day (BID) | SUBCUTANEOUS | Status: DC
  Administered 2020-05-16 (×2): 40 [IU] via SUBCUTANEOUS
  Filled 2020-05-16 (×4): qty 0.4

## 2020-05-16 MED ORDER — INSULIN ASPART 100 UNIT/ML ~~LOC~~ SOLN
5.0000 [IU] | SUBCUTANEOUS | Status: DC
  Administered 2020-05-16 – 2020-05-17 (×6): 5 [IU] via SUBCUTANEOUS
  Filled 2020-05-16 (×7): qty 1

## 2020-05-16 MED ORDER — SODIUM CHLORIDE 0.9 % IV SOLN
3.0000 g | Freq: Four times a day (QID) | INTRAVENOUS | Status: DC
  Administered 2020-05-16 – 2020-05-22 (×23): 3 g via INTRAVENOUS
  Filled 2020-05-16: qty 8
  Filled 2020-05-16: qty 3
  Filled 2020-05-16: qty 8
  Filled 2020-05-16 (×8): qty 3
  Filled 2020-05-16: qty 8
  Filled 2020-05-16 (×4): qty 3
  Filled 2020-05-16 (×2): qty 8
  Filled 2020-05-16: qty 3
  Filled 2020-05-16: qty 8
  Filled 2020-05-16 (×2): qty 3
  Filled 2020-05-16: qty 8
  Filled 2020-05-16 (×3): qty 3

## 2020-05-16 NOTE — Progress Notes (Signed)
Inpatient Diabetes Program Recommendations  AACE/ADA: New Consensus Statement on Inpatient Glycemic Control   Target Ranges:  Prepandial:   less than 140 mg/dL      Peak postprandial:   less than 180 mg/dL (1-2 hours)      Critically ill patients:  140 - 180 mg/dL   Results for Collin Brown, Collin Brown (MRN [id number]) as of 05/16/2020 09:15  Ref. Range 05/15/2020 07:45 05/15/2020 11:36 05/15/2020 16:17 05/15/2020 19:52 05/15/2020 23:19 05/16/2020 03:26 05/16/2020 08:10  Glucose-Capillary Latest Ref Range: 70 - 99 mg/dL 790 (H) 240 (H) 973 (H) 285 (H) 241 (H) 260 (H) 294 (H)   Review of Glycemic Control  history: No Outpatient Diabetes medications: NA Current orders for Inpatient glycemic control: Levemir 35 units BID, Novolog 0-20 units Q4H; Decadron 6 mg Q24H, Vital @ 70 ml/hr  Inpatient Diabetes Program Recommendations:    Insulin: If steroids continued as ordered, please consider increasing Levemir to 40 units BID and ordering Novolog 5 units Q4H for tube feeding coverage.  If tube feeding is stopped or held then Novolog tube feeding coverage should also be stopped or held.  Thanks, Orlando Penner, RN, MSN, CDE Diabetes Coordinator Inpatient Diabetes Program [phone] (Team Pager from 8am to 5pm)

## 2020-05-16 NOTE — Progress Notes (Signed)
PHARMACY CONSULT NOTE  Pharmacy Consult for Electrolyte Monitoring and Replacement   Recent Labs: Potassium (mmol/L)  Date Value  05/16/2020 3.8   Magnesium (mg/dL)  Date Value  76/28/3151 2.8 (H)   Calcium (mg/dL)  Date Value  76/16/0737 8.8 (L)   Albumin (g/dL)  Date Value  10/62/6948 2.8 (L)   Phosphorus (mg/dL)  Date Value  54/62/7035 3.2   Sodium (mmol/L)  Date Value  05/16/2020 144   Corrected Calcium: 9.8 mg/dL  Assessment: Pharmacy has been consulted for electrolyte replacement in this 34 YOM requiring intubation and admission to the CCU for COVID PNA. Treatment including IV decadron, leading to the need for high amounts of insulin and eventually an insulin drip that was subsequently transitioned to subcutaneous insulin.   Free water: 300 mL every 4 hours Lasix 80mg  IV BID  Goal of Therapy:  Electrolytes WNL  Plan:   No electrolyte replacement at this time follow up in the morning  Follow-up electrolytes in am  , PharmD 05/16/2020 7:05 AM

## 2020-05-16 NOTE — Progress Notes (Signed)
CRITICAL CARE PROGRESS NOTE    Name: Collin Brown MRN: [id number] DOB: 1986/04/27     LOS: 90   SUBJECTIVE FINDINGS & SIGNIFICANT EVENTS    Patient description:  34 yo with OSA, morbid obesity BMI>56 came in with flu like illness diagnosed with COVID19 pneumonia was initially ion Medical floor with hospitalist service initiated Remdesevir and barictitinib, he progressively became more dyspneic he was placed on BIPAP and did not tolerate then placed on HFNC did not tolerate patient himself consented to MV and was intubated in MICU. Mother was notified who is sick at home with [place].   Events:  9/27- patient on 65%FiO2 with proning protocol. CVP trending, goal negative fluid balance.   05/08/20- patient with worsening ventilation increased FiO2 to 100% overnight and had episode of severe bradycardia.  Concern for mucus plugging will perform recruitment maneuvers and possible bronchoscopy.  We were able to perform tracheal aspirate and have weaned down fIO2 to 75% 05/09/20- patient had bradycardic episode and he desaturated overnight with temporary increase to 100FiO2.  Lasix q12 going. I spoke to brother today for update.  His tracheal aspirate came back normal flora so we will stop antibiotic.  05/10/20-patient with pneumomediastinum and on maximal setting on ventilator. Poor prognosis very unfortunate young man. I called and updated Mother today Collin Brown.  05/11/20- patient weaned to 80%. Will continue with weaning and proning protocol  05/12/20- patient remains critically ill.  Mother at bedside we discussed his care plan, she is with strong faith and continues to pray for her son to improve.  05/13/20- patient remains crtically ill.  Proning per protocol. No significant events overnight.  05/14/20- Continues to be  febrile, FiO2 up to 100%, improved down to 60% after increasing PEEP from 10 to 16.  05/15/20- patient remains crtically ill, overall status unchanged 05/16/20-persistent fevers, CT head and chest, does have sinus opacification.    Lines/tubes  9/25 - Left IJ placed  : Airway 8 mm (Active)  Secured at (cm) 26 cm 05/07/20 0801  Measured From Lips 05/07/20 [place] 05/07/20 0801  Secured By [employer] 05/07/20 0801  Tube Holder Repositioned Yes 05/07/20 0801  Cuff Pressure (cm H2O) 26 cm H2O 05/07/20 0801  Site Condition Dry 05/07/20 0801     CVC Triple Lumen 05/05/20 Left Internal jugular (Active)  Indication for Insertion or Continuance of Line Vasoactive infusions;Poor Vasculature-patient has had multiple peripheral attempts or PIVs lasting less than 24 hours 05/06/20 2130  Site Assessment Clean;Dry;Intact 05/06/20 2130  Proximal Lumen Status Infusing 05/06/20 2130  Medial Lumen Status Infusing 05/06/20 2130  Distal Lumen Status Infusing 05/06/20 2130  Dressing Type Transparent;Occlusive 05/06/20 2130  Dressing Status Clean;Dry;Intact 05/06/20 2130  Antimicrobial disc in place? Yes 05/06/20 2130  Line Care Connections checked and tightened 05/06/20 2130  Dressing Intervention Dressing reinforced 05/06/20 0900  Dressing Change Due 05/12/20 05/06/20 2130     NG/OG Tube Orogastric Center mouth Xray 65 cm (Active)  Cm Marking at Nare/Corner of Mouth (if applicable) 65 cm 75/44/92 2130  Site Assessment Clean;Dry;Intact 05/06/20 2130  Ongoing Placement Verification No change in cm markings or external length of tube from initial placement;No change in respiratory status;No acute changes, not attributed to clinical condition;Xray 05/06/20 2130  Status Clamped 05/06/20 2130     Urethral Catheter Donnald Garre, RN 16 Fr. (Active)  Indication for Insertion or Continuance of Catheter Unstable critically ill patients first 24-48 hours (See Criteria) 05/06/20 2000  Site Assessment Clean;Intact;Prolapse;Swelling 05/06/20 2000  Catheter Maintenance Bag below level of bladder;Catheter secured;Drainage bag/tubing not touching floor;Insertion date on drainage bag;No dependent loops;Seal intact 05/06/20 2000  Collection Container Standard drainage bag 05/06/20 2000  Securement Method Securing device (Describe) 05/06/20 2000  Urinary Catheter Interventions (if applicable) Unclamped 83/25/49 2000  Output (mL) 100 mL 05/07/20 0445    Microbiology/Sepsis markers: Results for orders placed or performed during the hospital encounter of 05/03/20  Blood Culture (routine x 2)     Status: None   Collection Time: 05/03/20  4:33 PM   Specimen: BLOOD  Result Value Ref Range Status   Specimen Description BLOOD BLOOD LEFT HAND  Final   Special Requests   Final    BOTTLES DRAWN AEROBIC AND ANAEROBIC Blood Culture results may not be optimal due to an inadequate volume of blood received in culture bottles   Culture   Final    NO GROWTH 5 DAYS Performed at [facility], [address], [place], [place] [address]    Report Status 05/08/2020 FINAL  Final  Blood Culture (routine x 2)     Status: None   Collection Time: 05/03/20  4:33 PM   Specimen: BLOOD  Result Value Ref Range Status   Specimen Description BLOOD BLOOD RIGHT HAND  Final   Special Requests   Final    BOTTLES DRAWN AEROBIC AND ANAEROBIC Blood Culture adequate volume   Culture   Final    NO GROWTH 5 DAYS Performed at [facility], [address] [place], [place] [address]    Report Status 05/08/2020 FINAL  Final  Respiratory Panel by RT PCR (Flu A&B, Covid) -     Status: Abnormal   Collection Time: 05/03/20  7:32 PM  Result Value Ref Range Status   SARS Coronavirus 2 by RT PCR POSITIVE (A) NEGATIVE Final    Comment: RESULT CALLED TO, READ BACK BY AND VERIFIED WITH: Leonides Schanz RN 2114 05/03/20 HNM/AR    Influenza A by PCR NEGATIVE NEGATIVE Final   Influenza B by PCR NEGATIVE  NEGATIVE Final    Comment: Performed at [facility], [address] [place], [place] [address]  MRSA PCR Screening     Status: None   Collection Time: 05/06/20 11:51 AM   Specimen: Nasal Mucosa; Nasopharyngeal  Result Value Ref Range Status   MRSA by PCR NEGATIVE NEGATIVE Final    Comment:        The GeneXpert MRSA Assay (FDA approved for NASAL specimens only), is one component of a comprehensive MRSA colonization surveillance program. It is not intended to diagnose MRSA infection nor to guide or monitor treatment for MRSA infections. Performed at [facility], [address] [place], [place] [address]   Culture, respiratory (non-expectorated)     Status: None   Collection Time: 05/07/20 10:55 AM   Specimen: Tracheal Aspirate; Respiratory  Result Value Ref Range Status   Specimen Description   Final    TRACHEAL ASPIRATE Performed at [facility], [address] [place], [place] [address]    Special Requests   Final    NONE Performed at [facility], [place], [place] [address]    Gram Stain   Final    RARE WBC PRESENT,BOTH PMN AND MONONUCLEAR NO ORGANISMS SEEN    Culture   Final    RARE Normal respiratory flora-no Staph aureus or Pseudomonas seen Performed at [facility] [address], [place], [place] [address]  Report Status 05/09/2020 FINAL  Final  CULTURE, BLOOD (ROUTINE X 2) w Reflex to ID Panel     Status: None   Collection Time: 05/10/20 12:47 PM   Specimen: BLOOD  Result Value Ref Range Status   Specimen Description BLOOD BLOOD RIGHT HAND  Final   Special Requests   Final    BOTTLES DRAWN AEROBIC AND ANAEROBIC Blood Culture adequate volume   Culture   Final    NO GROWTH 5 DAYS Performed at [facility], [address] [place], [place] [address]    Report Status 05/15/2020 FINAL  Final  CULTURE, BLOOD (ROUTINE X 2) w Reflex to ID Panel     Status: None   Collection Time:  05/10/20  1:21 PM   Specimen: BLOOD  Result Value Ref Range Status   Specimen Description BLOOD BLOOD RIGHT HAND  Final   Special Requests   Final    BOTTLES DRAWN AEROBIC AND ANAEROBIC Blood Culture adequate volume   Culture   Final    NO GROWTH 5 DAYS Performed at [facility], [address] [place], [place] [address]    Report Status 05/15/2020 FINAL  Final  Culture, respiratory (non-expectorated)     Status: None   Collection Time: 05/14/20  8:58 AM   Specimen: Tracheal Aspirate; Respiratory  Result Value Ref Range Status   Specimen Description   Final    TRACHEAL ASPIRATE Performed at [facility], [address] [place], [place] [address]    Special Requests   Final    NONE Performed at [facility], [place], [place] [address]    Gram Stain   Final    RARE WBC PRESENT,BOTH PMN AND MONONUCLEAR NO ORGANISMS SEEN    Culture   Final    RARE Normal respiratory flora-no Staph aureus or Pseudomonas seen Performed at [place] [address], [place], [place] [address]    Report Status 05/16/2020 FINAL  Final  CULTURE, BLOOD (ROUTINE X 2) w Reflex to ID Panel     Status: None (Preliminary result)   Collection Time: 05/14/20  9:33 AM   Specimen: BLOOD LEFT HAND  Result Value Ref Range Status   Specimen Description BLOOD LEFT HAND  Final   Special Requests   Final    BOTTLES DRAWN AEROBIC AND ANAEROBIC Blood Culture adequate volume   Culture   Final    NO GROWTH 2 DAYS Performed at [facility], [address], [place], [place] [address]    Report Status PENDING  Incomplete  CULTURE, BLOOD (ROUTINE X 2) w Reflex to ID Panel     Status: None (Preliminary result)   Collection Time: 05/14/20 10:17 AM   Specimen: BLOOD LEFT HAND  Result Value Ref Range Status   Specimen Description BLOOD LEFT HAND  Final   Special Requests   Final    BOTTLES DRAWN AEROBIC AND ANAEROBIC Blood Culture adequate volume    Culture   Final    NO GROWTH 2 DAYS Performed at [facility], [address], [place], [place] [address]    Report Status PENDING  Incomplete  Urine Culture     Status: None   Collection Time: 05/14/20  2:33 PM   Specimen: Urine, Random  Result Value Ref Range Status   Specimen Description   Final    URINE, RANDOM Performed at [facility], [address], [place], [place] [address]    Special Requests  Final    NONE Performed at [facility], [address] [place], [place] [address]    Culture   Final    NO GROWTH Performed at [facility], [place] [address], [place], [place] [address]    Report Status 05/15/2020 FINAL  Final    Anti-infectives:  Anti-infectives (From admission, onward)   Start     Dose/Rate Route Frequency Ordered Stop   05/14/20 0600  ceFEPIme (MAXIPIME) 2 g in sodium chloride 0.9 % 100 mL IVPB        2 g 200 mL/hr over 30 Minutes Intravenous Every 8 hours 05/14/20 0533     05/07/20 1200  meropenem (MERREM) 1 g in sodium chloride 0.9 % 100 mL IVPB  Status:  Discontinued        1 g 200 mL/hr over 30 Minutes Intravenous Every 8 hours 05/07/20 1020 05/09/20 1019   05/06/20 1200  vancomycin (VANCOCIN) IVPB 1000 mg/200 mL premix  Status:  Discontinued        1,000 mg 200 mL/hr over 60 Minutes Intravenous Every 8 hours 05/05/20 2330 05/07/20 1020   05/06/20 0200  vancomycin (VANCOREADY) IVPB 500 mg/100 mL       "Followed by" Linked Group Details   500 mg 100 mL/hr over 60 Minutes Intravenous  Once 05/05/20 2330 05/06/20 0454   05/06/20 0000  vancomycin (VANCOREADY) IVPB 2000 mg/400 mL       "Followed by" Linked Group Details   2,000 mg 200 mL/hr over 120 Minutes Intravenous  Once 05/05/20 2330 05/06/20 0304   05/05/20 2315  cefTRIAXone (ROCEPHIN) 2 g in sodium chloride 0.9 % 100 mL IVPB  Status:  Discontinued        2 g 200 mL/hr over 30 Minutes Intravenous Daily at 10 pm 05/05/20 2307 05/07/20 1020   05/04/20 1000   remdesivir 100 mg in sodium chloride 0.9 % 100 mL IVPB       "Followed by" Linked Group Details   100 mg 200 mL/hr over 30 Minutes Intravenous Daily 05/03/20 1718 05/07/20 0941   05/03/20 1800  remdesivir 200 mg in sodium chloride 0.9% 250 mL IVPB       "Followed by" Linked Group Details   200 mg 580 mL/hr over 30 Minutes Intravenous Once 05/03/20 1718 05/03/20 1941       Consults: Treatment Team:  Pccm, Ander Gaster, MD    ALLERGIES   Patient has no known allergies.    REVIEW OF SYSTEMS    10 point ROS unable to perform due to sedation on MV  PHYSICAL EXAMINATION   Vital Signs: Temp:  [99.8 F (37.7 C)-102.2 F (39 C)] 102.2 F (39 C) (10/06 1200) Pulse Rate:  [87-133] 131 (10/06 1300) Resp:  [17-36] 18 (10/06 1300) BP: (104-166)/(53-99) 136/74 (10/06 1300) SpO2:  [96 %-100 %] 96 % (10/06 1300) FiO2 (%):  [50 %-55 %] 50 % (10/06 1200) Weight:  [171.3 kg] 171.3 kg (10/06 0500)  GENERAL:NAD HEAD: Normocephalic, atraumatic.  EYES: Pupils equal, round, reactive to light.  No scleral icterus.  MOUTH: Moist mucosal membrane. NECK: Supple. No thyromegaly. No nodules. No JVD.  PULMONARY: rhonchi bilaterally  CARDIOVASCULAR: S1 and S2. Regular rate and rhythm. No murmurs, rubs, or gallops.  GASTROINTESTINAL: Soft, nontender, non-distended. No masses. Positive bowel sounds. No hepatosplenomegaly.  MUSCULOSKELETAL: No swelling, clubbing, or edema.  NEUROLOGIC: Mild distress due to acute illness SKIN:intact,warm,dry   MEDICATIONS   Infusions: . sodium chloride    . acetylcysteine 15 mg/kg/hr (05/16/20  1450)  . ceFEPime (MAXIPIME) IV 200 mL/hr at 05/16/20 1311  . famotidine (PEPCID) IV Stopped (05/16/20 1610)  . feeding supplement (VITAL 1.5 CAL) 1,000 mL (05/16/20 0741)  . fentaNYL infusion INTRAVENOUS 400 mcg/hr (05/16/20 1311)  . midazolam 15 mg/hr (05/16/20 1311)  . norepinephrine (LEVOPHED) Adult infusion 2 mcg/min (05/16/20 1311)  . vasopressin Stopped  (05/16/20 9604)   Scheduled Medications: . vitamin C  500 mg Per Tube Daily  . aspirin  81 mg Per Tube Daily  . chlorhexidine gluconate (MEDLINE KIT)  15 mL Mouth Rinse BID  . Chlorhexidine Gluconate Cloth  6 each Topical Daily  . dexamethasone (DECADRON) injection  6 mg Intravenous Q24H  . docusate  100 mg Per Tube BID  . enoxaparin (LOVENOX) injection  40 mg Subcutaneous BID  . feeding supplement (PROSource TF)  90 mL Per Tube TID  . fentaNYL (SUBLIMAZE) injection  50 mcg Intravenous Once  . free water  300 mL Per Tube Q4H  . furosemide  80 mg Intravenous Daily  . furosemide  80 mg Intravenous Daily  . insulin aspart  0-20 Units Subcutaneous Q4H  . insulin aspart  5 Units Subcutaneous Q4H  . insulin detemir  40 Units Subcutaneous BID  . mouth rinse  15 mL Mouth Rinse 10 times per day  . multivitamin with minerals  1 tablet Per Tube Daily  . polyethylene glycol  17 g Per Tube Daily  . zinc sulfate  220 mg Per Tube Daily   PRN Medications: sodium chloride, acetaminophen, dextrose, fentaNYL, ibuprofen, vecuronium Hemodynamic parameters:   Intake/Output: 10/05 0701 - 10/06 0700 In: 3587.9 [I.V.:1348.1; NG/GT:1854.7; IV Piggyback:385.1] Out: 5409 [Urine:2825; Emesis/NG output:950]  Ventilator  Settings: Vent Mode: PRVC FiO2 (%):  [50 %-55 %] 50 % Set Rate:  [25 bmp-35 bmp] 25 bmp Vt Set:  [460 mL] 460 mL PEEP:  [16 cmH20] 16 cmH20 Plateau Pressure:  [30 cmH20-33 cmH20] 30 cmH20 Ventilator settings discussed in detail with RT  Other Labs:     LAB RESULTS:  Basic Metabolic Panel: Recent Labs  Lab 05/10/20 0402 05/10/20 1700 05/12/20 0325 05/12/20 0325 05/13/20 0400 05/13/20 0400 05/14/20 0523 05/14/20 0523 05/15/20 0415 05/16/20 0401  NA 152*   < > 152*  --  153*  --  153*  --  145 144  K 4.2   < > 3.8   < > 4.2   < > 3.6   < > 3.6 3.8  CL 105   < > 101  --  99  --  97*  --  95* 94*  CO2 40*   < > 44*  --  41*  --  45*  --  38* 40*  GLUCOSE 181*   < > 132*  --   232*  --  145*  --  245* 300*  BUN 52*   < > 46*  --  47*  --  55*  --  70* 70*  CREATININE 1.01   < > 1.04  --  1.06  --  1.08  --  0.99 0.98  CALCIUM 8.0*   < > 8.7*  --  8.5*  --  8.8*  --  8.5* 8.8*  MG 3.2*  --   --   --  2.9*  --   --   --  3.0* 2.8*  PHOS 5.7*   < > 2.6  --  4.5  --  3.8  --  3.6 3.2   < > = values in this  interval not displayed.   Liver Function Tests: Recent Labs  Lab 05/10/20 0402 05/11/20 0414 05/12/20 0325  AST 43* 49* 134*  ALT 52* 54* 74*  ALKPHOS 41 38 41  BILITOT 1.0 1.2 1.0  PROT 5.8* 5.9* 6.0*  ALBUMIN 3.1* 2.9* 2.8*   No results for input(s): LIPASE, AMYLASE in the last 168 hours. No results for input(s): AMMONIA in the last 168 hours. CBC: Recent Labs  Lab 05/12/20 0325 05/13/20 0400 05/14/20 0523 05/15/20 0415 05/16/20 0401  WBC 11.5* 15.5* 13.2* 13.1* 14.3*  NEUTROABS 9.4* 12.1* 9.6* 10.2* 11.5*  HGB 11.3* 12.3* 11.1* 9.9* 9.8*  HCT 38.6* 41.2 37.9* 33.2* 32.4*  MCV 92.6 92.2 94.0 91.0 90.0  PLT 195 249 220 218 252   Cardiac Enzymes: No results for input(s): CKTOTAL, CKMB, CKMBINDEX, TROPONINI in the last 168 hours. BNP: Invalid input(s): POCBNP CBG: Recent Labs  Lab 05/15/20 1952 05/15/20 2319 05/16/20 0326 05/16/20 0810 05/16/20 1212  GLUCAP 285* 241* 260* 294* 357*    IMAGING RESULTS:  Imaging: CT HEAD WO CONTRAST  Result Date: 05/16/2020 CLINICAL DATA:  Delirium. Coronavirus infection with ventilator support. EXAM: CT HEAD WITHOUT CONTRAST TECHNIQUE: Contiguous axial images were obtained from the base of the skull through the vertex without intravenous contrast. COMPARISON:  None. FINDINGS: Brain: The brain shows a normal appearance without evidence of malformation, atrophy, old or acute small or large vessel infarction, mass lesion, hemorrhage, hydrocephalus or extra-axial collection. Vascular: No hyperdense vessel. No evidence of atherosclerotic calcification. Skull: Normal.  No traumatic finding.  No focal bone  lesion. Sinuses/Orbits: Opacification of the paranasal sinuses, often seen with chronic ventilator support. Other: None significant IMPRESSION: 1. Normal head CT. 2. Opacification of the paranasal sinuses, often seen with chronic ventilator support. Electronically Signed   By: Nelson Chimes M.D.   On: 05/16/2020 14:23   CT ANGIO CHEST PE W OR WO CONTRAST  Result Date: 05/16/2020 CLINICAL DATA:  Coronavirus infection. Ventilator support. Assess for pulmonary emboli. EXAM: CT ANGIOGRAPHY CHEST WITH CONTRAST TECHNIQUE: Multidetector CT imaging of the chest was performed using the standard protocol during bolus administration of intravenous contrast. Multiplanar CT image reconstructions and MIPs were obtained to evaluate the vascular anatomy. CONTRAST:  61m OMNIPAQUE IOHEXOL 350 MG/ML SOLN COMPARISON:  Radiography 05/14/2020 FINDINGS: Cardiovascular: Pulmonary arterial opacification is poor. I do not see any pulmonary emboli, but anything other than large central emboli would be inapparent. Heart size is normal. No pericardial effusion. No acute aortic pathology. Mediastinum/Nodes: Pneumomediastinum of a mild degree. Lungs/Pleura: Widespread bilateral patchy pulmonary infiltrates without dense lobar consolidation or collapse. No effusions. Upper Abdomen: Negative Musculoskeletal: Negative Review of the MIP images confirms the above findings. IMPRESSION: 1. Pneumomediastinum of a mild degree. 2. Pulmonary arterial opacification is poor. I do not see any pulmonary emboli, but anything other than large central emboli would be inapparent. 3. Widespread bilateral patchy pulmonary infiltrates consistent with viral pneumonia. No dense lobar consolidation or collapse. Electronically Signed   By: MNelson ChimesM.D.   On: 05/16/2020 14:26   _0 @ CT HEAD WO CONTRAST  Result Date: 05/16/2020 CLINICAL DATA:  Delirium. Coronavirus infection with ventilator support. EXAM: CT HEAD WITHOUT CONTRAST TECHNIQUE: Contiguous  axial images were obtained from the base of the skull through the vertex without intravenous contrast. COMPARISON:  None. FINDINGS: Brain: The brain shows a normal appearance without evidence of malformation, atrophy, old or acute small or large vessel infarction, mass lesion, hemorrhage, hydrocephalus or extra-axial collection. Vascular: No hyperdense vessel. No  evidence of atherosclerotic calcification. Skull: Normal.  No traumatic finding.  No focal bone lesion. Sinuses/Orbits: Opacification of the paranasal sinuses, often seen with chronic ventilator support. Other: None significant IMPRESSION: 1. Normal head CT. 2. Opacification of the paranasal sinuses, often seen with chronic ventilator support. Electronically Signed   By: Nelson Chimes M.D.   On: 05/16/2020 14:23   CT ANGIO CHEST PE W OR WO CONTRAST  Result Date: 05/16/2020 CLINICAL DATA:  Coronavirus infection. Ventilator support. Assess for pulmonary emboli. EXAM: CT ANGIOGRAPHY CHEST WITH CONTRAST TECHNIQUE: Multidetector CT imaging of the chest was performed using the standard protocol during bolus administration of intravenous contrast. Multiplanar CT image reconstructions and MIPs were obtained to evaluate the vascular anatomy. CONTRAST:  4m OMNIPAQUE IOHEXOL 350 MG/ML SOLN COMPARISON:  Radiography 05/14/2020 FINDINGS: Cardiovascular: Pulmonary arterial opacification is poor. I do not see any pulmonary emboli, but anything other than large central emboli would be inapparent. Heart size is normal. No pericardial effusion. No acute aortic pathology. Mediastinum/Nodes: Pneumomediastinum of a mild degree. Lungs/Pleura: Widespread bilateral patchy pulmonary infiltrates without dense lobar consolidation or collapse. No effusions. Upper Abdomen: Negative Musculoskeletal: Negative Review of the MIP images confirms the above findings. IMPRESSION: 1. Pneumomediastinum of a mild degree. 2. Pulmonary arterial opacification is poor. I do not see any pulmonary  emboli, but anything other than large central emboli would be inapparent. 3. Widespread bilateral patchy pulmonary infiltrates consistent with viral pneumonia. No dense lobar consolidation or collapse. Electronically Signed   By: MNelson ChimesM.D.   On: 05/16/2020 14:26    ASSESSMENT AND PLAN    -Multidisciplinary rounds held today  Acute hypoxic respiratory failure due to COVID-19 ARDS COVID-19 pneumonia Underlying morbid obesity Mechanical ventilation Acute sinusitis my dragon Full vent support for now-vent settings reviewed and established  Mechanical ventilation via ARDS protocol, target PRVC 6 cc/kg Goal plateau pressure less than 30, driving pressure less than 15 SBT once all parameters met  VAP bundle implemented  Maintain O2 sats 88% or higher Remdesivir therapy complete Continue steroids switched to Decadron 10 mg twice daily>>6 daily -05/08/20 Discontinued baricitinib due to leukopenia and lack of efficacy Trend WBC and monitor fever curves  Continue proning and weaning per protocol as tolerated CT scan of the chest does not show any change on multifocal pneumonia consistent with COVID-19 CT head shows pansinusitis, will treat with Unasyn discontinue cefepime   ID -continue IV abx as prescibed -follow up cultures  GI/Nutrition GI PROPHYLAXIS as indicated DIET-->TF's as tolerated Constipation protocol as indicated  ENDO - ICU hypoglycemic\Hyperglycemia protocol -check FSBS per protocol   ELECTROLYTES -follow labs as needed -replace as needed -pharmacy consultation   DVT/GI PRX ordered -SCDs  TRANSFUSIONS AS NEEDED MONITOR FSBS ASSESS the need for LABS as needed  Multidisciplinary rounds were performed with ICU team.  Total critical care time 35 minutes.   CRenold Don MD [place] [facility]   *This note was dictated using voice recognition software/Dragon.  Despite best efforts to proofread, errors can occur which can change the meaning.  Any  change was purely unintentional.

## 2020-05-16 NOTE — Progress Notes (Signed)
Pt was taken to CT and back to CCU while on the vent.

## 2020-05-17 ENCOUNTER — Inpatient Hospital Stay: Payer: Self-pay

## 2020-05-17 ENCOUNTER — Inpatient Hospital Stay: Payer: BC Managed Care – PPO

## 2020-05-17 DIAGNOSIS — J1282 Pneumonia due to coronavirus disease 2019: Secondary | ICD-10-CM

## 2020-05-17 DIAGNOSIS — U071 COVID-19: Secondary | ICD-10-CM | POA: Diagnosis not present

## 2020-05-17 DIAGNOSIS — J9601 Acute respiratory failure with hypoxia: Secondary | ICD-10-CM | POA: Diagnosis not present

## 2020-05-17 LAB — BASIC METABOLIC PANEL
Anion gap: 13 (ref 5–15)
Anion gap: 15 (ref 5–15)
Anion gap: 11 (ref 5–15)
BUN: 38 mg/dL — ABNORMAL HIGH (ref 6–20)
BUN: 39 mg/dL — ABNORMAL HIGH (ref 6–20)
BUN: 38 mg/dL — ABNORMAL HIGH (ref 6–20)
CO2: 39 mmol/L — ABNORMAL HIGH (ref 22–32)
CO2: 36 mmol/L — ABNORMAL HIGH (ref 22–32)
CO2: 39 mmol/L — ABNORMAL HIGH (ref 22–32)
Calcium: 8.8 mg/dL — ABNORMAL LOW (ref 8.9–10.3)
Calcium: 9.2 mg/dL (ref 8.9–10.3)
Calcium: 9.1 mg/dL (ref 8.9–10.3)
Chloride: 97 mmol/L — ABNORMAL LOW (ref 98–111)
Chloride: 98 mmol/L (ref 98–111)
Chloride: 100 mmol/L (ref 98–111)
Creatinine, Ser: 0.7 mg/dL (ref 0.61–1.24)
Creatinine, Ser: 0.75 mg/dL (ref 0.61–1.24)
Creatinine, Ser: 0.85 mg/dL (ref 0.61–1.24)
GFR calc non Af Amer: >60 mL/min (ref >60)
GFR calc non Af Amer: >60 mL/min (ref >60)
GFR calc non Af Amer: >60 mL/min (ref >60)
Glucose, Bld: 290 mg/dL — ABNORMAL HIGH (ref 70–99)
Glucose, Bld: 320 mg/dL — ABNORMAL HIGH (ref 70–99)
Glucose, Bld: 174 mg/dL — ABNORMAL HIGH (ref 70–99)
Potassium: 3.1 mmol/L — ABNORMAL LOW (ref 3.5–5.1)
Potassium: 3 mmol/L — ABNORMAL LOW (ref 3.5–5.1)
Potassium: 3.5 mmol/L (ref 3.5–5.1)
Sodium: 149 mmol/L — ABNORMAL HIGH (ref 135–145)
Sodium: 149 mmol/L — ABNORMAL HIGH (ref 135–145)
Sodium: 150 mmol/L — ABNORMAL HIGH (ref 135–145)

## 2020-05-17 LAB — CBC WITH DIFFERENTIAL/PLATELET
Abs Immature Granulocytes: 0.24 10*3/uL — ABNORMAL HIGH (ref 0.00–0.07)
Basophils Absolute: 0 10*3/uL (ref 0.0–0.1)
Basophils Relative: 0 %
Eosinophils Absolute: 0 10*3/uL (ref 0.0–0.5)
Eosinophils Relative: 0 %
HCT: 34.6 % — ABNORMAL LOW (ref 39.0–52.0)
Hemoglobin: 10.6 g/dL — ABNORMAL LOW (ref 13.0–17.0)
Immature Granulocytes: 2 %
Lymphocytes Relative: 13 %
Lymphs Abs: 1.5 10*3/uL (ref 0.7–4.0)
MCH: 28 pg (ref 26.0–34.0)
MCHC: 30.6 g/dL (ref 30.0–36.0)
MCV: 91.3 fL (ref 80.0–100.0)
Monocytes Absolute: 0.7 10*3/uL (ref 0.1–1.0)
Monocytes Relative: 6 %
Neutro Abs: 9.2 10*3/uL — ABNORMAL HIGH (ref 1.7–7.7)
Neutrophils Relative %: 79 %
Platelets: 261 10*3/uL (ref 150–400)
RBC: 3.79 MIL/uL — ABNORMAL LOW (ref 4.22–5.81)
RDW: 16.5 % — ABNORMAL HIGH (ref 11.5–15.5)
WBC: 11.6 10*3/uL — ABNORMAL HIGH (ref 4.0–10.5)
nRBC: 0.3 % — ABNORMAL HIGH (ref 0.0–0.2)

## 2020-05-17 LAB — GLUCOSE, CAPILLARY
Glucose-Capillary: 152 mg/dL — ABNORMAL HIGH (ref 70–99)
Glucose-Capillary: 154 mg/dL — ABNORMAL HIGH (ref 70–99)
Glucose-Capillary: 157 mg/dL — ABNORMAL HIGH (ref 70–99)
Glucose-Capillary: 168 mg/dL — ABNORMAL HIGH (ref 70–99)
Glucose-Capillary: 183 mg/dL — ABNORMAL HIGH (ref 70–99)
Glucose-Capillary: 192 mg/dL — ABNORMAL HIGH (ref 70–99)
Glucose-Capillary: 223 mg/dL — ABNORMAL HIGH (ref 70–99)
Glucose-Capillary: 239 mg/dL — ABNORMAL HIGH (ref 70–99)
Glucose-Capillary: 243 mg/dL — ABNORMAL HIGH (ref 70–99)
Glucose-Capillary: 244 mg/dL — ABNORMAL HIGH (ref 70–99)
Glucose-Capillary: 288 mg/dL — ABNORMAL HIGH (ref 70–99)
Glucose-Capillary: 301 mg/dL — ABNORMAL HIGH (ref 70–99)
Glucose-Capillary: 308 mg/dL — ABNORMAL HIGH (ref 70–99)
Glucose-Capillary: 315 mg/dL — ABNORMAL HIGH (ref 70–99)
Glucose-Capillary: 328 mg/dL — ABNORMAL HIGH (ref 70–99)
Glucose-Capillary: 343 mg/dL — ABNORMAL HIGH (ref 70–99)

## 2020-05-17 LAB — PHOSPHORUS: Phosphorus: 2.8 mg/dL (ref 2.5–4.6)

## 2020-05-17 LAB — MAGNESIUM: Magnesium: 2.9 mg/dL — ABNORMAL HIGH (ref 1.7–2.4)

## 2020-05-17 IMAGING — US US EXTREM  UP VENOUS BILAT
1 series · 13 of 24 positions shown · non-contrast
Comparison: None.

CLINICAL DATA: Respiratory failure

EXAM:
BILATERAL UPPER EXTREMITY VENOUS DOPPLER ULTRASOUND
TECHNIQUE: Gray-scale sonography with graded compression, as well as color
Doppler and duplex ultrasound were performed to evaluate the
bilateral upper extremity deep venous systems from the level of the
subclavian vein and including the jugular, axillary, basilic,
radial, ulnar and upper cephalic vein. Spectral Doppler was utilized
to evaluate flow at rest and with distal augmentation maneuvers.

[Series 1: us venous img upper bilat (dvt) · portal-venous · 13 of 63 slices shown]
[im 1/63]
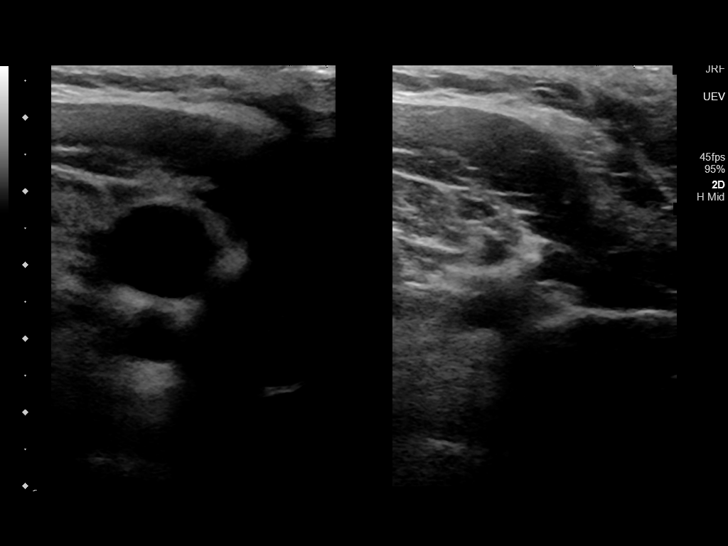
[im 6/63]
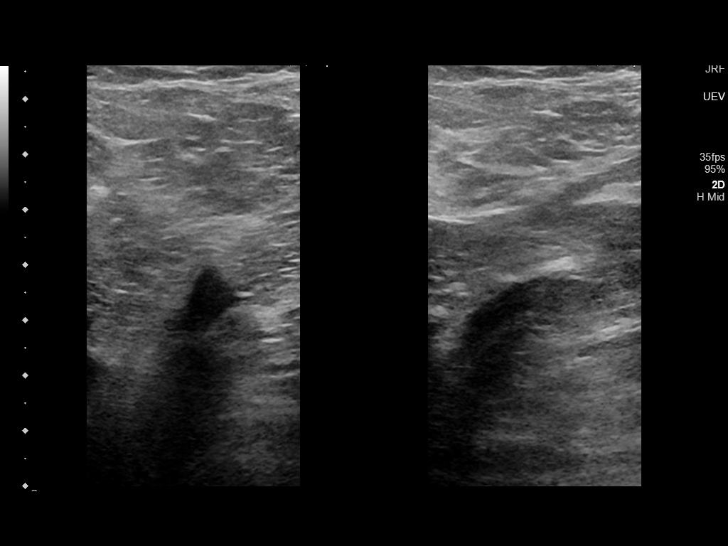
[im 11/63]
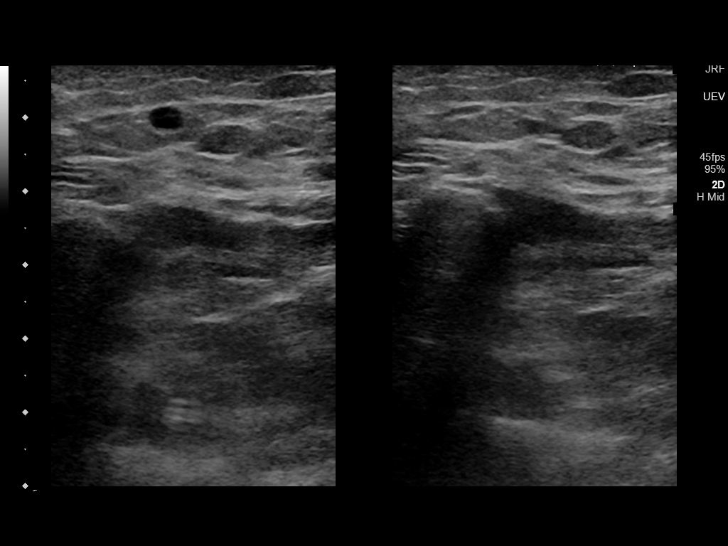
[im 17/63]
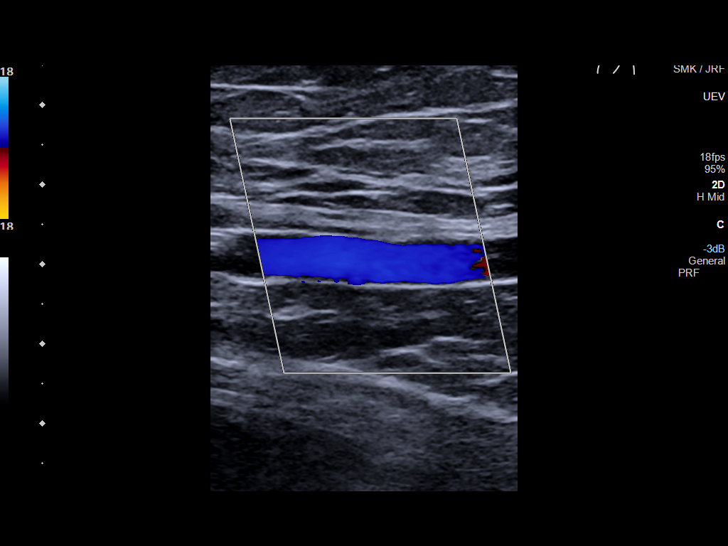
[im 22/63]
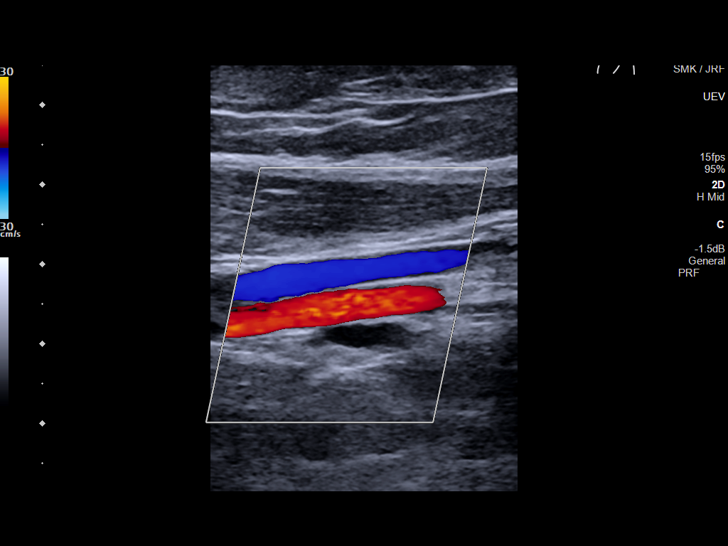
[im 27/63]
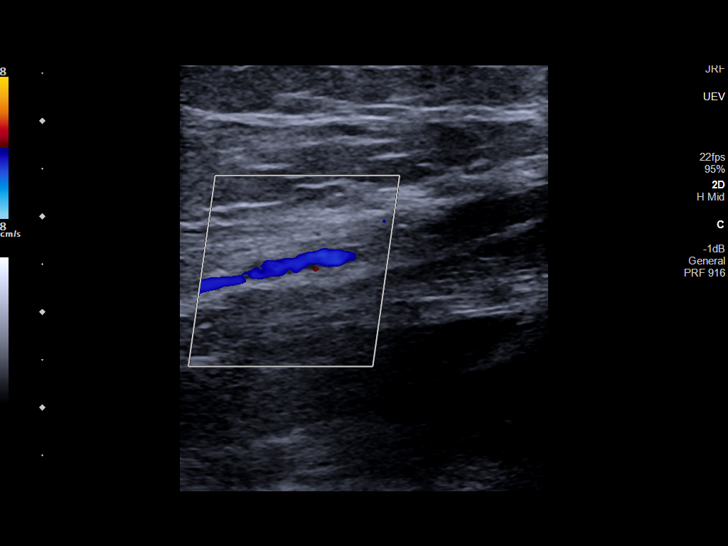
[im 33/63]
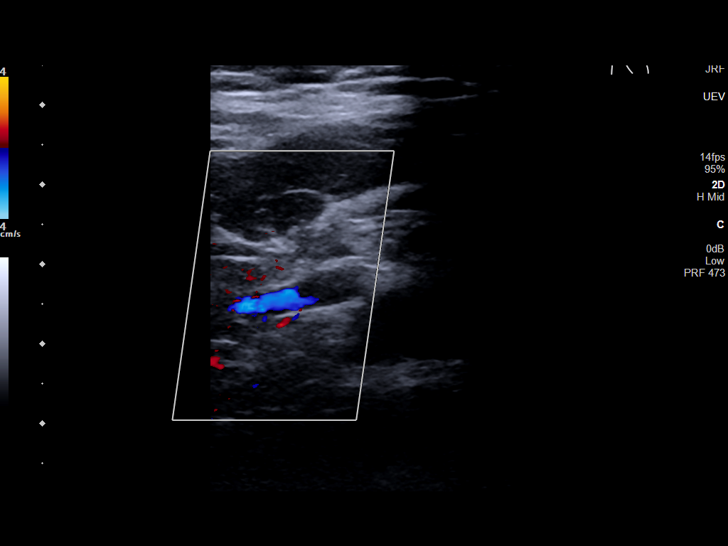
[im 36/63]
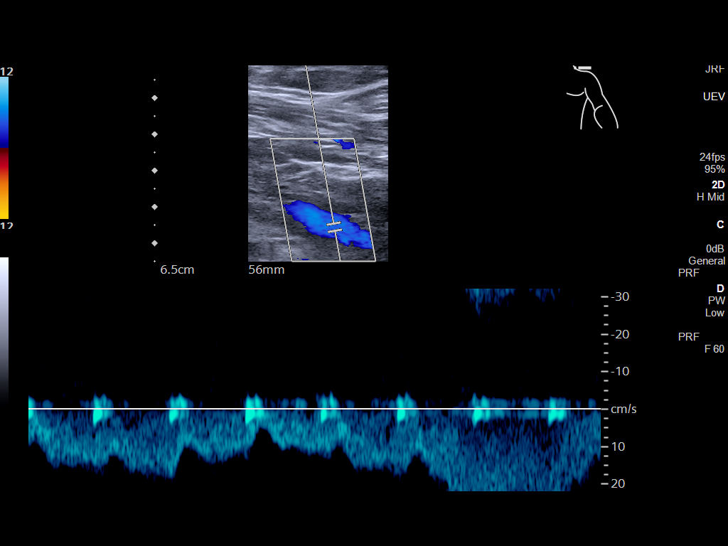
[im 41/63]
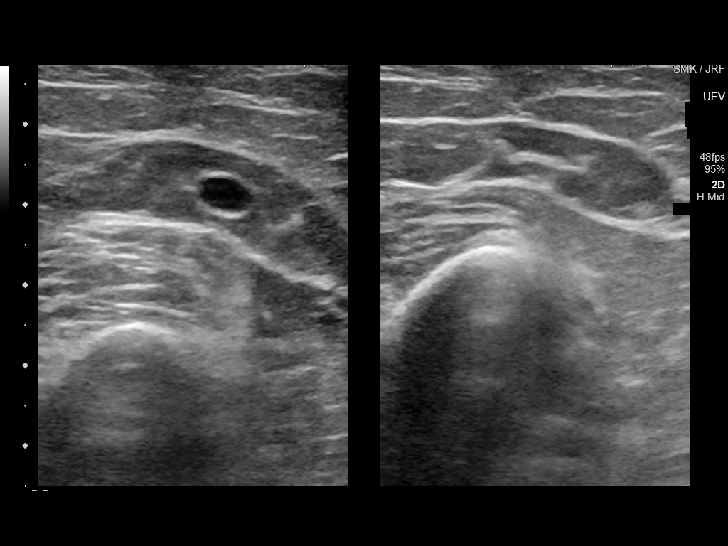
[im 46/63]
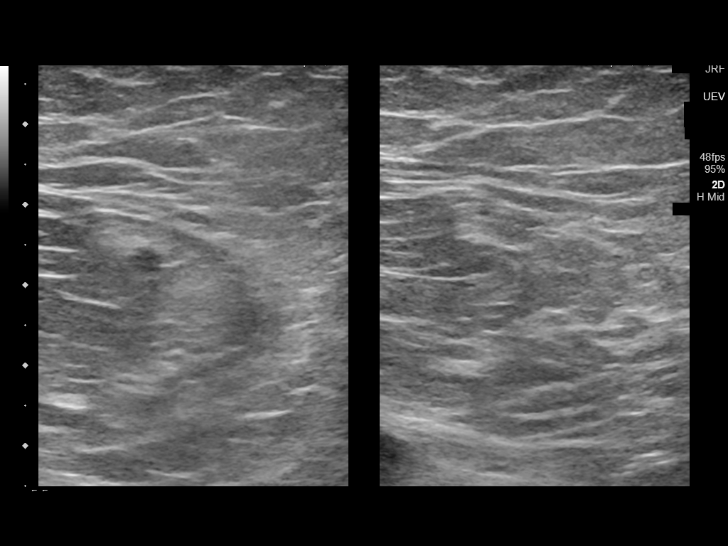
[im 52/63]
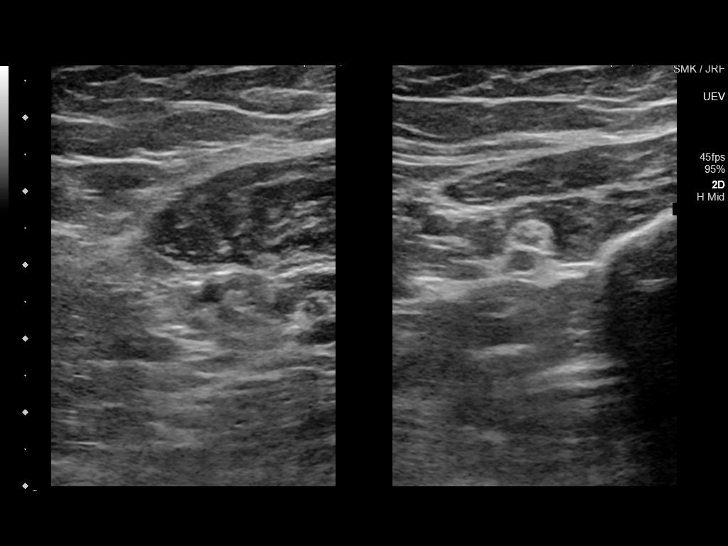
[im 57/63]
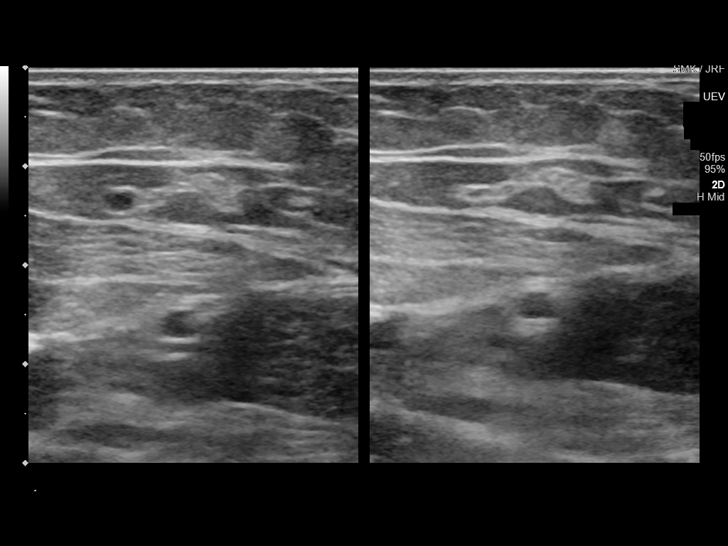
[im 63/63]
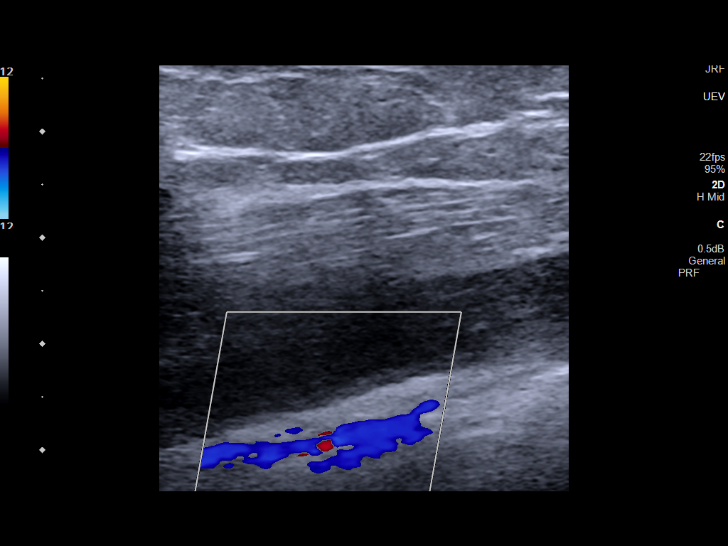

[13 of 24 positions shown; findings below may reference images not displayed]

FINDINGS: RIGHT UPPER EXTREMITY

Internal Jugular Vein: No evidence of thrombus. Normal
compressibility, respiratory phasicity and response to augmentation.

Subclavian Vein: No evidence of thrombus. Normal compressibility,
respiratory phasicity and response to augmentation.

Axillary Vein: No evidence of thrombus. Normal compressibility,
respiratory phasicity and response to augmentation.

Cephalic Vein: No evidence of thrombus. Normal compressibility,
respiratory phasicity and response to augmentation.

Basilic Vein: No evidence of thrombus. Normal compressibility,
respiratory phasicity and response to augmentation.

Brachial Veins: No evidence of thrombus. Normal compressibility,
respiratory phasicity and response to augmentation.

Radial Veins: No evidence of thrombus. Normal compressibility,
respiratory phasicity and response to augmentation.

Ulnar Veins: No evidence of thrombus. Normal compressibility,
respiratory phasicity and response to augmentation.

Other Findings:  None.

LEFT UPPER EXTREMITY

Internal Jugular Vein: Evaluation of the left internal jugular vein
is limited by the presence of a central venous catheter. No DVT was
identified at this level given this limitation.

Subclavian Vein: No evidence of thrombus. Normal compressibility,
respiratory phasicity and response to augmentation.

Axillary Vein: No evidence of thrombus. Normal compressibility,
respiratory phasicity and response to augmentation.

Cephalic Vein: No evidence of thrombus. Normal compressibility,
respiratory phasicity and response to augmentation.

Basilic Vein: No evidence of thrombus. Normal compressibility,
respiratory phasicity and response to augmentation.

Brachial Veins: No evidence of thrombus. Normal compressibility,
respiratory phasicity and response to augmentation.

Radial Veins: No evidence of thrombus. Normal compressibility,
respiratory phasicity and response to augmentation.

Ulnar Veins: No evidence of thrombus. Normal compressibility,
respiratory phasicity and response to augmentation.

Other Findings:  None.
IMPRESSION: No evidence of DVT within either upper extremity.

## 2020-05-17 IMAGING — US US EXTREM LOW VENOUS
1 series · 14 of 24 positions shown · non-contrast
Comparison: None.

CLINICAL DATA: Respiratory failure

EXAM:
BILATERAL LOWER EXTREMITY VENOUS DOPPLER ULTRASOUND
TECHNIQUE: Gray-scale sonography with compression, as well as color and duplex
ultrasound, were performed to evaluate the deep venous system(s)
from the level of the common femoral vein through the popliteal and
proximal calf veins.

[Series 1: us venous img lower bilat (dvt) · portal-venous · 14 of 60 slices shown]
[im 1/60]
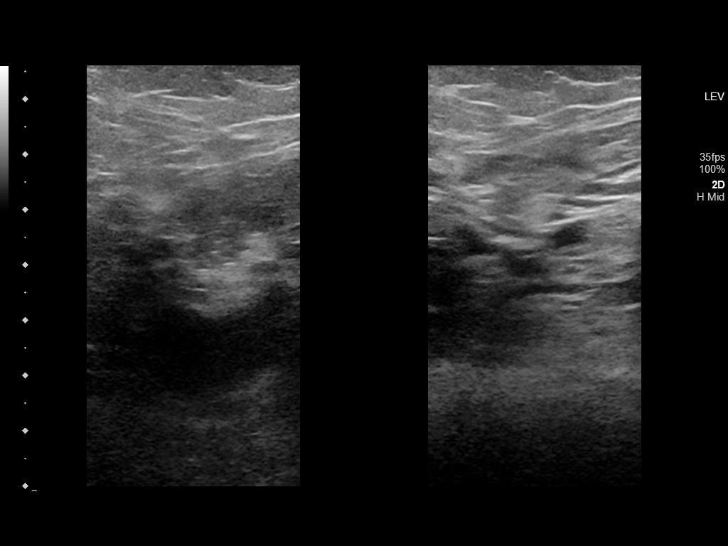
[im 6/60]
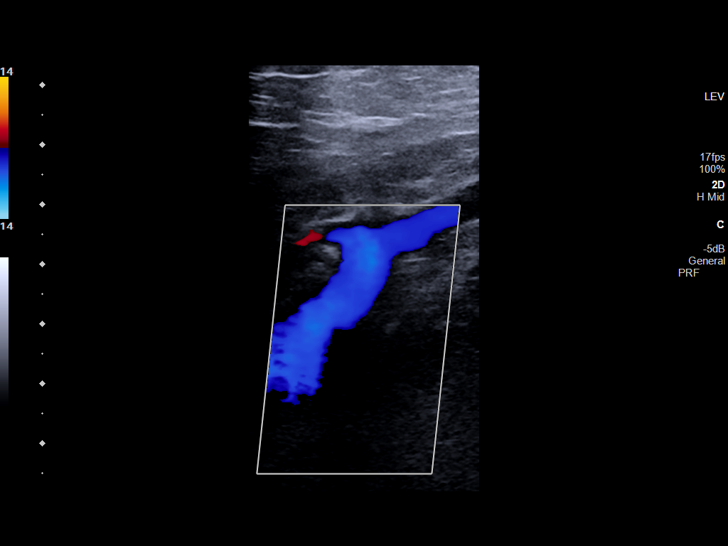
[im 11/60]
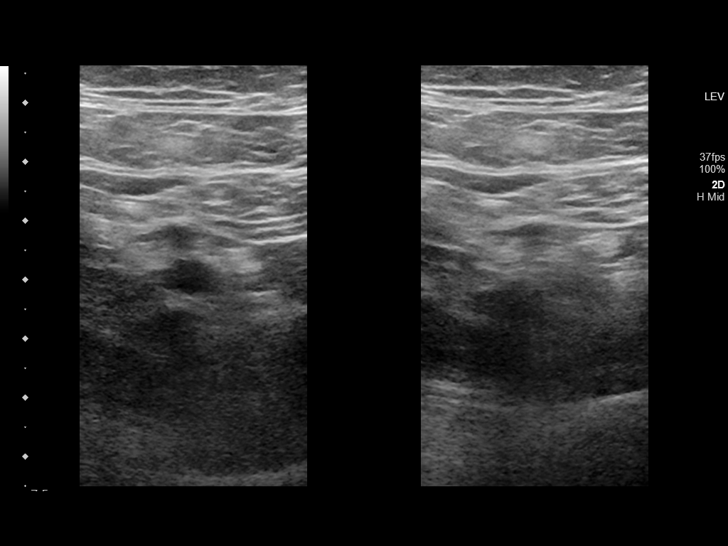
[im 16/60]
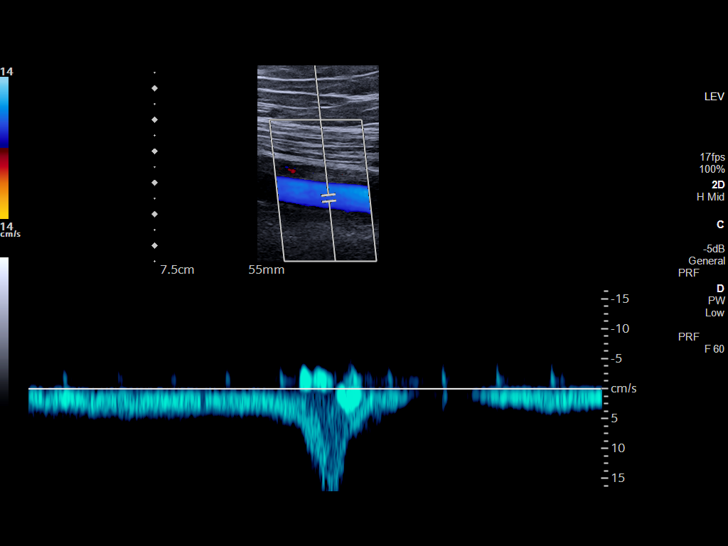
[im 18/60]
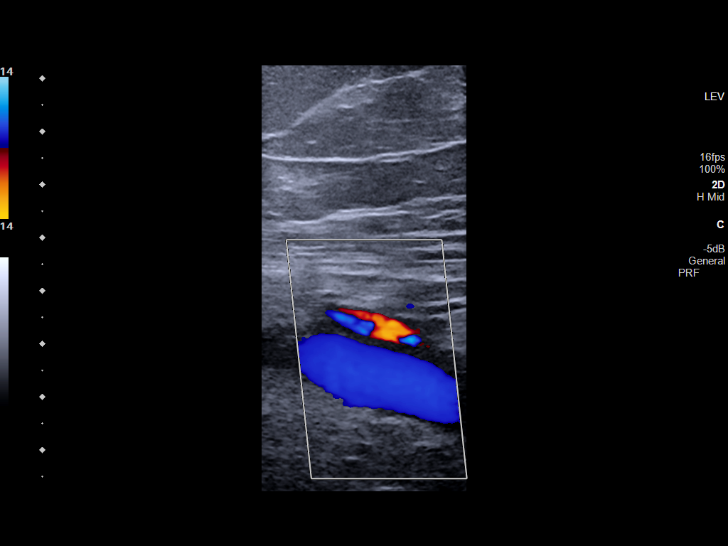
[im 24/60]
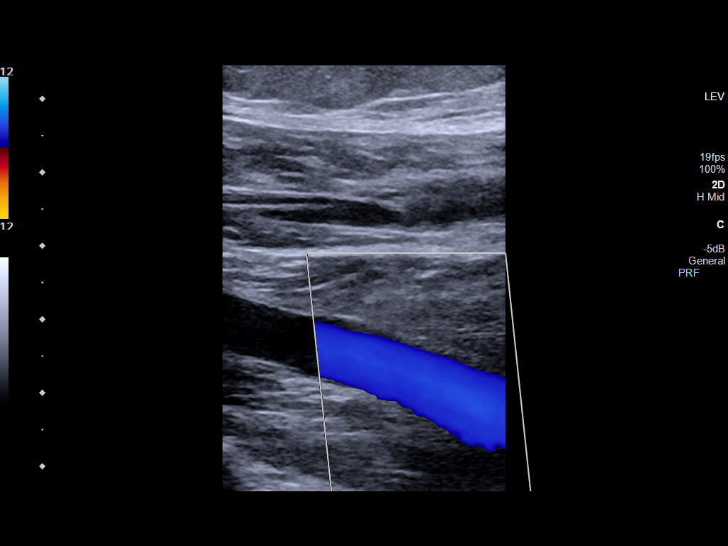
[im 29/60]
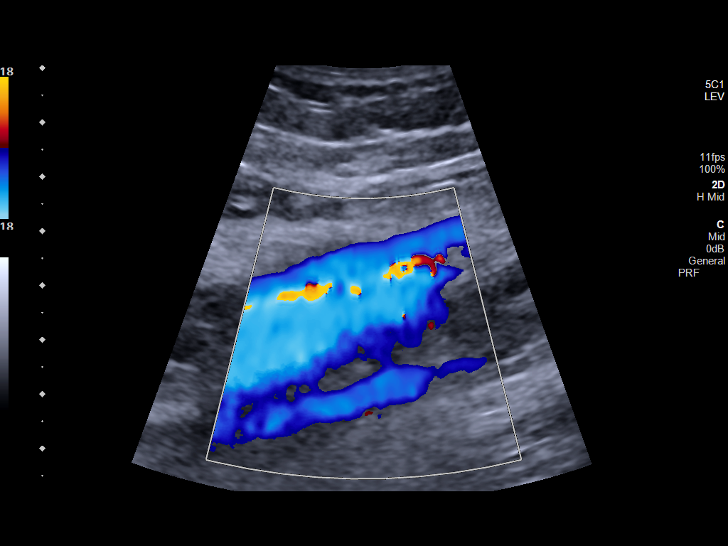
[im 31/60]
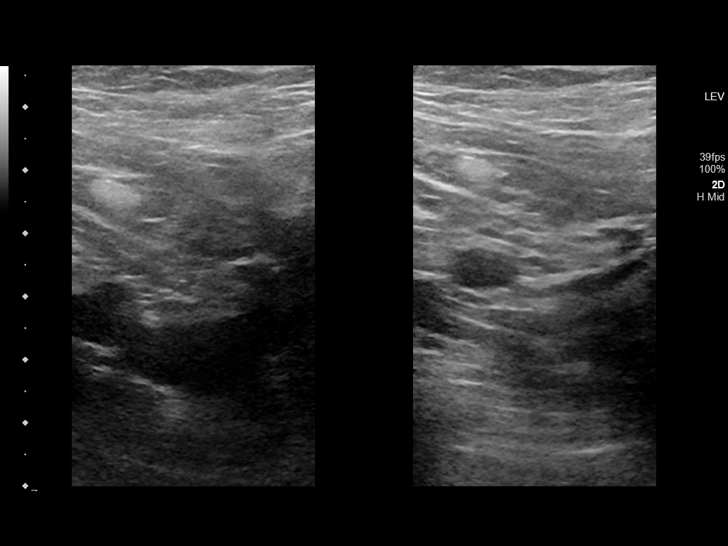
[im 36/60]
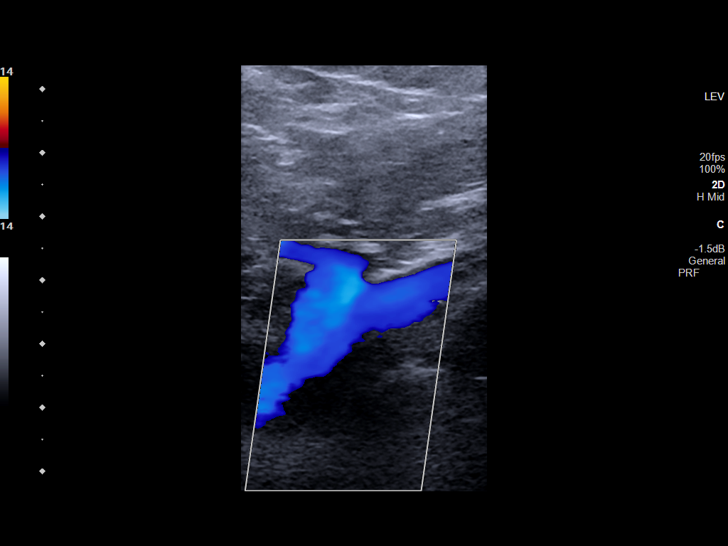
[im 42/60]
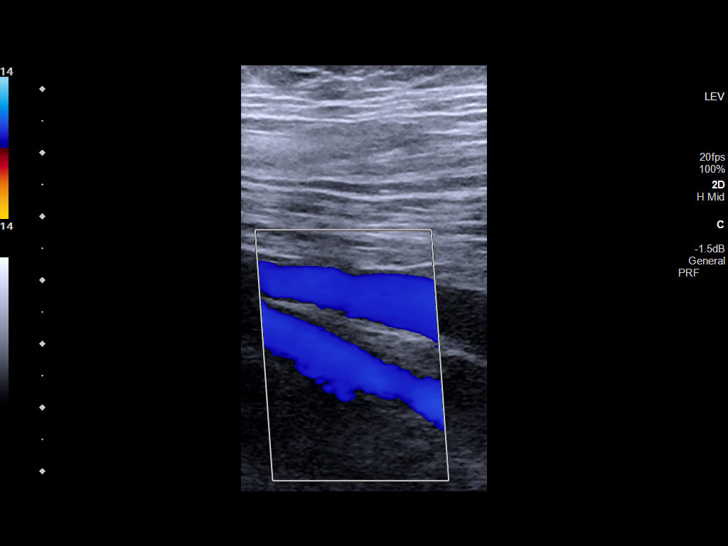
[im 47/60]
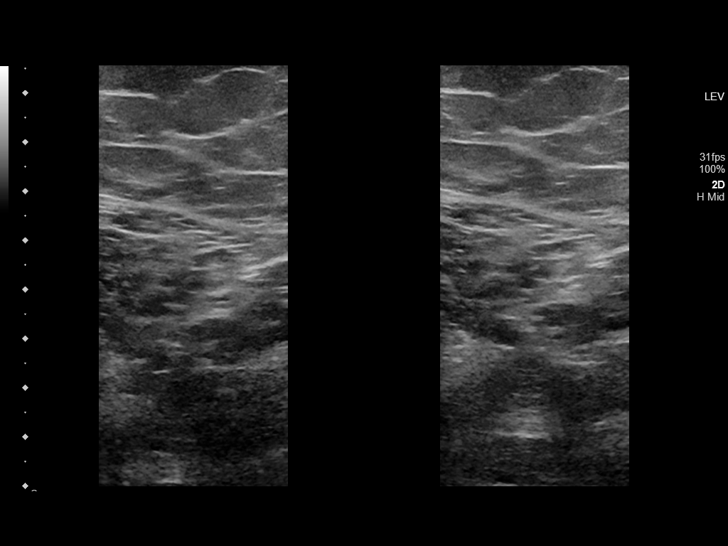
[im 49/60]
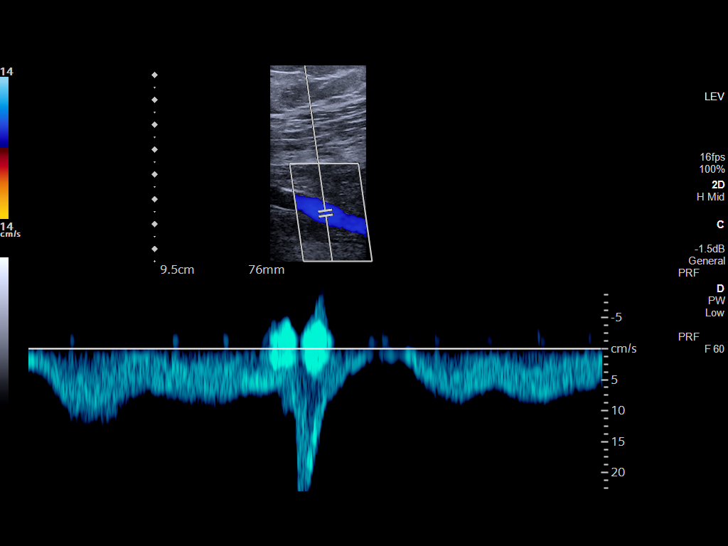
[im 54/60]
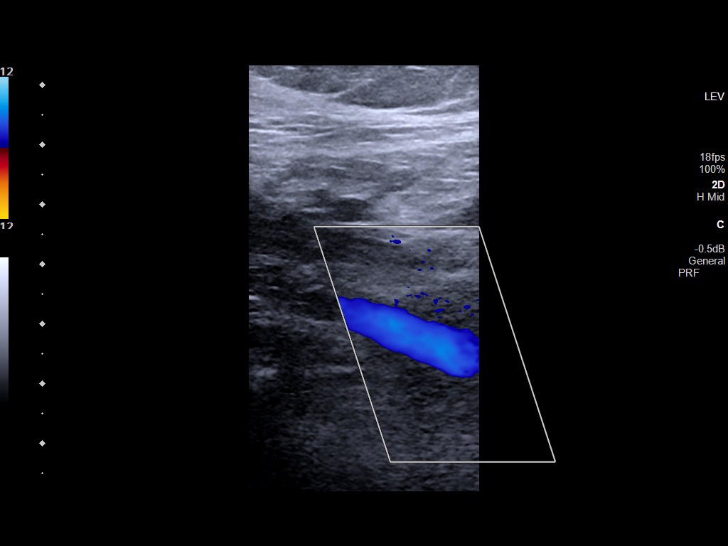
[im 60/60]
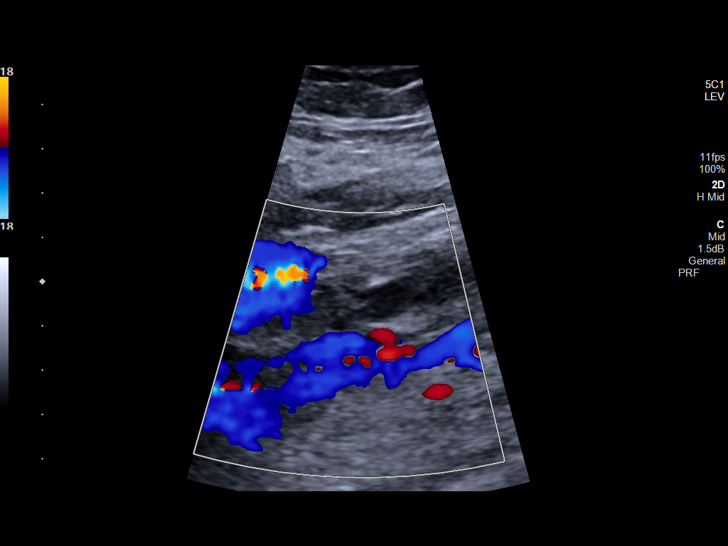

[14 of 24 positions shown; findings below may reference images not displayed]

FINDINGS: VENOUS

Normal compressibility of the common femoral, superficial femoral,
and popliteal veins, as well as the visualized calf veins.
Visualized portions of profunda femoral vein and great saphenous
vein unremarkable. No filling defects to suggest DVT on grayscale or
color Doppler imaging. Doppler waveforms show normal direction of
venous flow, normal respiratory plasticity and response to
augmentation.

OTHER

None.

Limitations: none
IMPRESSION: Negative.

## 2020-05-17 MED ORDER — FREE WATER
200.0000 mL | Status: DC
  Administered 2020-05-17 – 2020-05-20 (×39): 200 mL

## 2020-05-17 MED ORDER — DEXMEDETOMIDINE HCL IN NACL 400 MCG/100ML IV SOLN
0.4000 ug/kg/h | INTRAVENOUS | Status: DC
  Administered 2020-05-17: 0.8 ug/kg/h via INTRAVENOUS
  Administered 2020-05-17 – 2020-05-18 (×7): 1.2 ug/kg/h via INTRAVENOUS
  Administered 2020-05-18: 1 ug/kg/h via INTRAVENOUS
  Administered 2020-05-18 (×2): 1.2 ug/kg/h via INTRAVENOUS
  Administered 2020-05-18 (×2): 1 ug/kg/h via INTRAVENOUS
  Administered 2020-05-19 (×12): 1.2 ug/kg/h via INTRAVENOUS
  Administered 2020-05-20: 0.8 ug/kg/h via INTRAVENOUS
  Administered 2020-05-20: 1 ug/kg/h via INTRAVENOUS
  Administered 2020-05-20: 0.6 ug/kg/h via INTRAVENOUS
  Administered 2020-05-20 (×2): 1.2 ug/kg/h via INTRAVENOUS
  Administered 2020-05-20: 1 ug/kg/h via INTRAVENOUS
  Administered 2020-05-20: 1.2 ug/kg/h via INTRAVENOUS
  Administered 2020-05-21 (×2): 0.6 ug/kg/h via INTRAVENOUS
  Filled 2020-05-17 (×8): qty 100
  Filled 2020-05-17: qty 200
  Filled 2020-05-17 (×24): qty 100

## 2020-05-17 MED ORDER — POTASSIUM CHLORIDE 10 MEQ/100ML IV SOLN
10.0000 meq | INTRAVENOUS | Status: AC
  Administered 2020-05-17 (×3): 10 meq via INTRAVENOUS
  Filled 2020-05-17 (×3): qty 100

## 2020-05-17 MED ORDER — SODIUM CHLORIDE 0.9 % IV SOLN
0.0000 ug/h | INTRAVENOUS | Status: DC
  Administered 2020-05-17 (×2): 400 ug/h via INTRAVENOUS
  Filled 2020-05-17 (×3): qty 50

## 2020-05-17 MED ORDER — STERILE WATER FOR INJECTION IJ SOLN
INTRAMUSCULAR | Status: AC
  Administered 2020-05-17: 10 mL
  Filled 2020-05-17: qty 10

## 2020-05-17 MED ORDER — POTASSIUM CHLORIDE 20 MEQ/15ML (10%) PO SOLN
40.0000 meq | Freq: Two times a day (BID) | ORAL | Status: AC
  Administered 2020-05-17 (×2): 40 meq
  Filled 2020-05-17 (×4): qty 30

## 2020-05-17 MED ORDER — POTASSIUM CHLORIDE 20 MEQ PO PACK
40.0000 meq | PACK | Freq: Once | ORAL | Status: AC
  Administered 2020-05-17: 40 meq
  Filled 2020-05-17: qty 2

## 2020-05-17 MED ORDER — INSULIN ASPART 100 UNIT/ML ~~LOC~~ SOLN: 9.0000 [IU] | SUBCUTANEOUS | Status: DC

## 2020-05-17 MED ORDER — INSULIN REGULAR(HUMAN) IN NACL 100-0.9 UT/100ML-% IV SOLN
INTRAVENOUS | Status: DC
  Administered 2020-05-17: 15 [IU]/h via INTRAVENOUS
  Administered 2020-05-17: 24 [IU]/h via INTRAVENOUS
  Administered 2020-05-17: 15 [IU]/h via INTRAVENOUS
  Administered 2020-05-18: 10 [IU]/h via INTRAVENOUS
  Administered 2020-05-18: 20 [IU]/h via INTRAVENOUS
  Filled 2020-05-17 (×6): qty 100

## 2020-05-17 MED ORDER — INSULIN DETEMIR 100 UNIT/ML ~~LOC~~ SOLN
50.0000 [IU] | Freq: Two times a day (BID) | SUBCUTANEOUS | Status: DC
  Filled 2020-05-17 (×2): qty 0.5

## 2020-05-17 MED ORDER — ACETAZOLAMIDE SODIUM 500 MG IJ SOLR
500.0000 mg | Freq: Once | INTRAMUSCULAR | Status: AC
  Administered 2020-05-17: 500 mg via INTRAVENOUS
  Filled 2020-05-17: qty 500

## 2020-05-17 NOTE — Progress Notes (Signed)
PHARMACY CONSULT NOTE  Pharmacy Consult for Electrolyte Monitoring and Replacement   Recent Labs: Potassium (mmol/L)  Date Value  05/17/2020 3.5   Magnesium (mg/dL)  Date Value  88/91/6945 2.9 (H)   Calcium (mg/dL)  Date Value  03/88/8280 9.1   Albumin (g/dL)  Date Value  03/49/1791 2.8 (L)   Phosphorus (mg/dL)  Date Value  50/56/9794 2.8   Sodium (mmol/L)  Date Value  05/17/2020 150 (H)   Corrected Calcium: 10.2 mg/dL  Assessment: Pharmacy has been consulted for electrolyte replacement in this 34 YOM requiring intubation and admission to the CCU for COVID PNA. Treatment including IV decadron, leading to the need for high amounts of insulin and eventually an insulin drip that was transitioned to subcutaneous insulin but the insulin infusion has been restarted this morning. He received 40 mEq KCl per tube this morning with no improvement in potassium level  Free water: 300 mL every 2 hours Lasix 80mg  IV BID held today Acetazolamide 500 mg IV x 1  Goal of Therapy:  Electrolytes WNL  Plan:   Potassium 3.5, remains on insulin drip  40 mEq KCl per tube x 1  Follow-up electrolytes in am  , PharmD 05/17/2020 10:09 PM

## 2020-05-17 NOTE — Progress Notes (Addendum)
Shift summary:  - Patient remains intubated and sedated. - Back on Insulin infusion today for hyperglycemia. - Weaning FiO2 and PEEP as tolerated.

## 2020-05-17 NOTE — Progress Notes (Signed)
CRITICAL CARE NOTE 35 yo with OSA, morbid obesity BMI>56 came in with flu like illness diagnosed with COVID19 pneumonia was initially ion Medical floor with hospitalist service initiated Remdesevir and barictitinib, he progressively became more dyspneic he was placed on BIPAP and did not tolerate then placed on HFNC did not tolerate patient himself consented to MV and was intubated in MICU. Mother was notified who is sick at home with COVID.   Events:  9/27- patient on 65%FiO2 with proning protocol. CVP trending, goal negative fluid balance.   05/08/20- patient with worsening ventilation increased FiO2 to 100% overnight and had episode of severe bradycardia.  Concern for mucus plugging will perform recruitment maneuvers and possible bronchoscopy.  We were able to perform tracheal aspirate and have weaned down fIO2 to 75% 05/09/20- patient had bradycardic episode and he desaturated overnight with temporary increase to 100FiO2.  Lasix q12 going. I spoke to brother today for update.  His tracheal aspirate came back normal flora so we will stop antibiotic.  05/10/20-patient with pneumomediastinum and on maximal setting on ventilator. Poor prognosis very unfortunate young man. I called and updated Mother today Collin Brown.  05/11/20- patient weaned to 80%. Will continue with weaning and proning protocol  05/12/20- patient remains critically ill.  Mother at bedside we discussed his care plan, she is with strong faith and continues to pray for her son to improve.  05/13/20- patient remains crtically ill.  Proning per protocol. No significant events overnight.  05/14/20- Continues to be febrile, FiO2 up to 100%, improved down to 60% after increasing PEEP from 10 to 16.  05/15/20- patient remains crtically ill, overall status unchanged 05/16/20-persistent fevers, CT head and chest, does have sinus opacification. 10/7 severe ARDS    CC  follow up respiratory failure  SUBJECTIVE Patient remains critically  ill Prognosis is guarded Severe ARDS Did NOT tolerate proning  Vent Mode: PRVC FiO2 (%):  [50 %] 50 % Set Rate:  [25 bmp-35 bmp] 35 bmp Vt Set:  [460 mL] 460 mL PEEP:  [16 cmH20] 16 cmH20 Plateau Pressure:  [27 cmH20-32 cmH20] 27 cmH20  BMP Latest Ref Rng & Units 05/17/2020 05/16/2020 05/15/2020  Glucose 70 - 99 mg/dL 277(O) 242(P) 536(R)  BUN 6 - 20 mg/dL 44(R) 15(Q) 00(Q)  Creatinine 0.61 - 1.24 mg/dL 6.76 1.95 0.93  Sodium 135 - 145 mmol/L 149(H) 144 145  Potassium 3.5 - 5.1 mmol/L 3.1(L) 3.8 3.6  Chloride 98 - 111 mmol/L 97(L) 94(L) 95(L)  CO2 22 - 32 mmol/L 39(H) 40(H) 38(H)  Calcium 8.9 - 10.3 mg/dL 2.6(Z) 1.2(W) 5.8(K)      BP (!) 141/74   Pulse (!) 104   Temp 99.2 F (37.3 C)   Resp (!) 35   Ht 5\' 10"  (1.778 m)   Wt (!) 171.3 kg   SpO2 94%   BMI 54.19 kg/m    I/O last 3 completed shifts: In: 9626.7 [I.V.:3108.2; NG/GT:5820; IV Piggyback:698.4] Out: 7460 [Urine:7450; Stool:10] No intake/output data recorded.  SpO2: 94 % O2 Flow Rate (L/min): 50 L/min FiO2 (%): 50 %  Estimated body mass index is 54.19 kg/m as calculated from the following:   Height as of this encounter: 5\' 10"  (1.778 m).   Weight as of this encounter: 171.3 kg.  SIGNIFICANT EVENTS   REVIEW OF SYSTEMS  PATIENT IS UNABLE TO PROVIDE COMPLETE REVIEW OF SYSTEMS DUE TO SEVERE CRITICAL ILLNESS   Pressure Injury 05/12/20 Toe (Comment  which one) Anterior;Right Stage 2 -  Partial thickness loss  of dermis presenting as a shallow open injury with a red, pink wound bed without slough. (Active)  05/12/20 1930  Location: Toe (Comment  which one)  Location Orientation: Anterior;Right  Staging: Stage 2 -  Partial thickness loss of dermis presenting as a shallow open injury with a red, pink wound bed without slough.  Wound Description (Comments):   Present on Admission: No     Pressure Injury 05/12/20 Toe (Comment  which one) Anterior;Left Stage 2 -  Partial thickness loss of dermis presenting as a  shallow open injury with a red, pink wound bed without slough. (Active)  05/12/20 1930  Location: Toe (Comment  which one)  Location Orientation: Anterior;Left  Staging: Stage 2 -  Partial thickness loss of dermis presenting as a shallow open injury with a red, pink wound bed without slough.  Wound Description (Comments):   Present on Admission: No      PHYSICAL EXAMINATION:  GENERAL:critically ill appearing, +resp distress NEUROLOGIC: obtunded, GCS<8  MEDICATIONS: I have reviewed all medications and confirmed regimen as documented   CULTURE RESULTS   Recent Results (from the past 240 hour(s))  Culture, respiratory (non-expectorated)     Status: None   Collection Time: 05/07/20 10:55 AM   Specimen: Tracheal Aspirate; Respiratory  Result Value Ref Range Status   Specimen Description   Final    TRACHEAL ASPIRATE Performed at [facility], [address], [place], [place] [address]    Special Requests   Final    NONE Performed at [facility], [address] [place], [place] [address]    Gram Stain   Final    RARE WBC PRESENT,BOTH PMN AND MONONUCLEAR NO ORGANISMS SEEN    Culture   Final    RARE Normal respiratory flora-no Staph aureus or Pseudomonas seen Performed at [facility], [address] [place], [place] [address]    Report Status 05/09/2020 FINAL  Final  CULTURE, BLOOD (ROUTINE X 2) w Reflex to ID Panel     Status: None   Collection Time: 05/10/20 12:47 PM   Specimen: BLOOD  Result Value Ref Range Status   Specimen Description BLOOD BLOOD RIGHT HAND  Final   Special Requests   Final    BOTTLES DRAWN AEROBIC AND ANAEROBIC Blood Culture adequate volume   Culture   Final    NO GROWTH 5 DAYS Performed at [facility], [address] [place], [place] [address]    Report Status 05/15/2020 FINAL  Final  CULTURE, BLOOD (ROUTINE X 2) w Reflex to ID Panel     Status: None   Collection Time: 05/10/20  1:21 PM   Specimen: BLOOD   Result Value Ref Range Status   Specimen Description BLOOD BLOOD RIGHT HAND  Final   Special Requests   Final    BOTTLES DRAWN AEROBIC AND ANAEROBIC Blood Culture adequate volume   Culture   Final    NO GROWTH 5 DAYS Performed at [facility], [address] [place], [place] [address]    Report Status 05/15/2020 FINAL  Final  Culture, respiratory (non-expectorated)     Status: None   Collection Time: 05/14/20  8:58 AM   Specimen: Tracheal Aspirate; Respiratory  Result Value Ref Range Status   Specimen Description   Final    TRACHEAL ASPIRATE Performed at [facility], [address] [place], [place] [address]    Special Requests   Final    NONE Performed at [facility], [phone] [place], [place] [address]    Gram Stain   Final    RARE WBC PRESENT,BOTH PMN AND MONONUCLEAR NO ORGANISMS SEEN    Culture   Final    RARE Normal respiratory flora-no Staph aureus or Pseudomonas seen Performed at [facility], [address] [place], [place] [address]    Report Status 05/16/2020 FINAL  Final  CULTURE, BLOOD (ROUTINE X 2) w Reflex to ID Panel     Status: None (Preliminary result)   Collection Time: 05/14/20  9:33 AM   Specimen: BLOOD LEFT HAND  Result Value Ref Range Status   Specimen Description BLOOD LEFT HAND  Final   Special Requests   Final    BOTTLES DRAWN AEROBIC AND ANAEROBIC Blood Culture adequate volume   Culture   Final    NO GROWTH 3 DAYS Performed at [facility], [address], [place], [place] [address]    Report Status PENDING  Incomplete  CULTURE, BLOOD (ROUTINE X 2) w Reflex to ID Panel     Status: None (Preliminary result)   Collection Time: 05/14/20 10:17 AM   Specimen: BLOOD LEFT HAND  Result Value Ref Range Status   Specimen Description BLOOD LEFT HAND  Final   Special Requests   Final    BOTTLES DRAWN AEROBIC AND ANAEROBIC Blood Culture adequate volume   Culture   Final    NO GROWTH 3  DAYS Performed at [facility], [address] [place], [place] [address]    Report Status PENDING  Incomplete  Urine Culture     Status: None   Collection Time: 05/14/20  2:33 PM   Specimen: Urine, Random  Result Value Ref Range Status   Specimen Description   Final    URINE, RANDOM Performed at [facility], [address] [place], [place] [address]    Special Requests   Final    NONE Performed at [facility], [address], [place], [place] [address]    Culture   Final    NO GROWTH Performed at [facility], [address] [place], [place] [address]    Report Status 05/15/2020 FINAL  Final          IMAGING    CT HEAD WO CONTRAST  Result Date: 05/16/2020 CLINICAL DATA:  Delirium. Coronavirus infection with ventilator support. EXAM: CT HEAD WITHOUT CONTRAST TECHNIQUE: Contiguous axial images were obtained from the base of the skull through the vertex without intravenous contrast. COMPARISON:  None. FINDINGS: Brain: The brain shows a normal appearance without evidence of malformation, atrophy, old or acute small or large vessel infarction, mass lesion, hemorrhage, hydrocephalus or extra-axial collection. Vascular: No hyperdense vessel. No evidence of atherosclerotic calcification. Skull: Normal.  No traumatic finding.  No focal bone lesion. Sinuses/Orbits: Opacification of the paranasal sinuses, often seen with chronic ventilator support. Other: None significant IMPRESSION: 1. Normal head CT. 2. Opacification of the paranasal sinuses, often seen with chronic ventilator support. Electronically Signed   By: Paulina Fusi M.D.   On: 05/16/2020 14:23   CT ANGIO CHEST PE W OR WO CONTRAST  Result Date: 05/16/2020 CLINICAL DATA:  Coronavirus infection. Ventilator support. Assess for pulmonary emboli. EXAM: CT ANGIOGRAPHY CHEST WITH CONTRAST TECHNIQUE: Multidetector CT imaging of the chest was performed using the standard protocol during bolus  administration of intravenous contrast. Multiplanar CT image reconstructions and MIPs were obtained to evaluate the vascular anatomy. CONTRAST:  55mL OMNIPAQUE IOHEXOL 350 MG/ML SOLN COMPARISON:  Radiography 05/14/2020 FINDINGS: Cardiovascular: Pulmonary arterial opacification is poor. I do not see any pulmonary emboli, but anything other than large central emboli would be inapparent. Heart size is normal. No pericardial effusion. No acute aortic pathology. Mediastinum/Nodes: Pneumomediastinum of a mild degree. Lungs/Pleura: Widespread bilateral patchy pulmonary infiltrates without dense lobar consolidation or collapse. No effusions. Upper Abdomen: Negative Musculoskeletal: Negative Review of the MIP images confirms the above findings. IMPRESSION: 1. Pneumomediastinum of a mild degree. 2. Pulmonary arterial opacification is poor. I do not see any pulmonary emboli, but anything other than large central emboli would be inapparent. 3. Widespread bilateral patchy pulmonary infiltrates consistent with viral pneumonia. No dense lobar consolidation or collapse. Electronically Signed   By: Paulina FusiMark  Shogry M.D.   On: 05/16/2020 14:26     Nutrition Status: Nutrition Problem: Inadequate oral intake Etiology: inability to eat Signs/Symptoms: NPO status Interventions: Tube feeding, Prostat, MVI     Indwelling Urinary Catheter continued, requirement due to   Reason to continue Indwelling Urinary Catheter strict Intake/Output monitoring for hemodynamic instability   Central Line/ continued, requirement due to  Reason to continue [employer] Line Monitoring of central venous pressure or other hemodynamic parameters and poor IV access   Ventilator continued, requirement due to severe respiratory failure   Ventilator Sedation RASS 0 to -2      ASSESSMENT AND PLAN SYNOPSIS  Acute hypoxemic respiratory failure due to COVID-19 pneumonia / ARDS Mechanical ventilation via ARDS protocol, target PRVC 6 cc/kg Wean PEEP  and FiO2 as able Goal plateau pressure less than 30, driving pressure less than 15 Paralytics if necessary for vent synchrony, gas exchange Cycle prone positioning if necessary for oxygenation Deep sedation per PAD protocol, goal RASS -4, currently fentanyl, midazolam Diuresis as blood pressure and renal function can tolerate, goal CVP 5-8.   diuresis as tolerated based on Kidney function VAP prevention order set   Severe ACUTE Hypoxic and Hypercapnic Respiratory Failure -continue Full MV support -continue Bronchodilator Therapy -Wean Fio2 and PEEP as tolerated -VAP/VENT bundle implementation Unable to wean from vent  ACUTE DIASTOLIC CARDIAC FAILURE- -oxygen as needed -Lasix as tolerated  Morbid obesity, possible OSA.   Will certainly impact respiratory mechanics, ventilator weaning Suspect will need to consider additional PEEP,    NEUROLOGY Acute toxic metabolic encephalopathy, need for sedation Goal RASS -2 to -3   CARDIAC ICU monitoring  ID -continue IV abx as prescibed -follow up cultures  GI GI PROPHYLAXIS as indicated  DIET-->TF's as tolerated Constipation protocol as indicated  ENDO - will use ICU hypoglycemic\Hyperglycemia protocol if indicated     ELECTROLYTES -follow labs as needed -replace as needed -pharmacy consultation and following   DVT/GI PRX ordered and assessed TRANSFUSIONS AS NEEDED MONITOR FSBS I Assessed the need for Labs I Assessed the need for Foley I Assessed the need for Central Venous Line Family Discussion when available I Assessed the need for Mobilization I made an Assessment of medications to be adjusted accordingly Safety Risk assessment completed   CASE DISCUSSED IN MULTIDISCIPLINARY ROUNDS WITH ICU TEAM  Critical Care Time devoted to patient care services described in this note is 34 minutes.   Overall, patient is critically ill, prognosis is guarded.  Patient with Multiorgan failure and at high risk for cardiac  arrest and death.    Lucie LeatherKurian David Analiah Drum, M.D.  [facility]  Medical Director [facility] Medical Director [facility]

## 2020-05-17 NOTE — Progress Notes (Signed)
PHARMACY CONSULT NOTE  Pharmacy Consult for Electrolyte Monitoring and Replacement   Recent Labs: Potassium (mmol/L)  Date Value  05/17/2020 3.1 (L)   Magnesium (mg/dL)  Date Value  68/03/8109 2.9 (H)   Calcium (mg/dL)  Date Value  31/59/4585 8.8 (L)   Albumin (g/dL)  Date Value  92/92/4462 2.8 (L)   Phosphorus (mg/dL)  Date Value  86/38/1771 2.8   Sodium (mmol/L)  Date Value  05/17/2020 149 (H)   Corrected Calcium: 9.8 mg/dL  Assessment: Pharmacy has been consulted for electrolyte replacement in this 34 YOM requiring intubation and admission to the CCU for COVID PNA. Treatment including IV decadron, leading to the need for high amounts of insulin and eventually an insulin drip that was transitioned to subcutaneous insulin but the insulin infusion has been restarted this morning  Free water: 300 mL every 2 hours Lasix 80mg  IV BID held today Acetazolamide 500 mg IV x 1  Goal of Therapy:  Electrolytes WNL  Plan:   40 mEq KCl per tube x 2  Follow-up electrolytes at 1300  , PharmD 05/17/2020 7:04 AM

## 2020-05-17 NOTE — Plan of Care (Signed)
  Problem: Education: Goal: Knowledge of General Education information will improve Description: Including pain rating scale, medication(s)/side effects and non-pharmacologic comfort measures Outcome: Not Progressing   Problem: Health Behavior/Discharge Planning: Goal: Ability to manage health-related needs will improve Outcome: Not Progressing   Problem: Clinical Measurements: Goal: Ability to maintain clinical measurements within normal limits will improve Outcome: Not Progressing Goal: Will remain free from infection Outcome: Not Progressing Goal: Diagnostic test results will improve Outcome: Not Progressing Goal: Respiratory complications will improve Outcome: Not Progressing Goal: Cardiovascular complication will be avoided Outcome: Not Progressing   Problem: Activity: Goal: Risk for activity intolerance will decrease Outcome: Not Progressing   Problem: Nutrition: Goal: Adequate nutrition will be maintained Outcome: Not Progressing   Problem: Coping: Goal: Level of anxiety will decrease Outcome: Not Progressing   Problem: Elimination: Goal: Will not experience complications related to bowel motility Outcome: Not Progressing Goal: Will not experience complications related to urinary retention Outcome: Not Progressing   Problem: Pain Managment: Goal: General experience of comfort will improve Outcome: Not Progressing   Problem: Safety: Goal: Ability to remain free from injury will improve Outcome: Not Progressing   Problem: Skin Integrity: Goal: Risk for impaired skin integrity will decrease Outcome: Not Progressing   Problem: Education: Goal: Knowledge of General Education information will improve Description: Including pain rating scale, medication(s)/side effects and non-pharmacologic comfort measures Outcome: Not Progressing   Problem: Health Behavior/Discharge Planning: Goal: Ability to manage health-related needs will improve Outcome: Not  Progressing   Problem: Clinical Measurements: Goal: Ability to maintain clinical measurements within normal limits will improve Outcome: Not Progressing   Problem: Activity: Goal: Risk for activity intolerance will decrease Outcome: Not Progressing   Problem: Nutrition: Goal: Adequate nutrition will be maintained Outcome: Not Progressing   Problem: Coping: Goal: Level of anxiety will decrease Outcome: Not Progressing   Problem: Elimination: Goal: Will not experience complications related to bowel motility Outcome: Not Progressing Goal: Will not experience complications related to urinary retention Outcome: Not Progressing   Problem: Pain Managment: Goal: General experience of comfort will improve Outcome: Not Progressing   Problem: Safety: Goal: Ability to remain free from injury will improve Outcome: Not Progressing   Problem: Skin Integrity: Goal: Risk for impaired skin integrity will decrease Outcome: Not Progressing

## 2020-05-17 NOTE — Progress Notes (Signed)
PHARMACY CONSULT NOTE  Pharmacy Consult for Electrolyte Monitoring and Replacement   Recent Labs: Potassium (mmol/L)  Date Value  05/17/2020 3.0 (L)   Magnesium (mg/dL)  Date Value  18/98/4210 2.9 (H)   Calcium (mg/dL)  Date Value  31/28/1188 9.2   Albumin (g/dL)  Date Value  67/73/7366 2.8 (L)   Phosphorus (mg/dL)  Date Value  81/59/4707 2.8   Sodium (mmol/L)  Date Value  05/17/2020 149 (H)   Corrected Calcium: 10.2 mg/dL  Assessment: Pharmacy has been consulted for electrolyte replacement in this 34 YOM requiring intubation and admission to the CCU for COVID PNA. Treatment including IV decadron, leading to the need for high amounts of insulin and eventually an insulin drip that was transitioned to subcutaneous insulin but the insulin infusion has been restarted this morning. He received 40 mEq KCl per tube this morning with no improvement in potassium level  Free water: 300 mL every 2 hours Lasix 80mg  IV BID held today Acetazolamide 500 mg IV x 1  Goal of Therapy:  Electrolytes WNL  Plan:   10 mEq IV KCl x 3  40 mEq KCl per tube x 1  Follow-up electrolytes at 1300  , PharmD 05/17/2020 1:47 PM

## 2020-05-17 NOTE — Progress Notes (Signed)
Inpatient Diabetes Program Recommendations  AACE/ADA: New Consensus Statement on Inpatient Glycemic Control   Target Ranges:  Prepandial:   less than 140 mg/dL      Peak postprandial:   less than 180 mg/dL (1-2 hours)      Critically ill patients:  140 - 180 mg/dL   Results for Collin Brown, Collin Brown (MRN [id number]) as of 05/17/2020 08:51  Ref. Range 05/16/2020 08:10 05/16/2020 12:12 05/16/2020 15:11 05/16/2020 15:37 05/16/2020 19:19 05/17/2020 00:10 05/17/2020 04:10 05/17/2020 07:35  Glucose-Capillary Latest Ref Range: 70 - 99 mg/dL 280 (H) 034 (H) 917 (H) 256 (H) 328 (H) 308 (H) 288 (H) 328 (H)   Review of Glycemic Control  Diabetes history: NO Outpatient Diabetes medications:NA Current orders for Inpatient glycemic control:Levemir 40 units BID, Novolog 0-20 units Q4H, Novolog 5 units Q4H; Decadron 6 mg Q24H, Vital @ 70 ml/hr  Inpatient Diabetes Program Recommendations:  Insulin: If steroids continued as ordered, please consider increasing Levemir to 50 units BID and ordering Novolog 9 units Q4H for tube feeding coverage.  If tube feeding is stopped or held then Novolog tube feeding coverage should also be stopped or held.  Thanks, Orlando Penner, RN, MSN, CDE Diabetes Coordinator Inpatient Diabetes Program [phone] (Team Pager from 8am to 5pm)

## 2020-05-18 ENCOUNTER — Inpatient Hospital Stay: Payer: BC Managed Care – PPO

## 2020-05-18 DIAGNOSIS — J1282 Pneumonia due to coronavirus disease 2019: Secondary | ICD-10-CM | POA: Diagnosis not present

## 2020-05-18 DIAGNOSIS — U071 COVID-19: Secondary | ICD-10-CM | POA: Diagnosis not present

## 2020-05-18 DIAGNOSIS — J9601 Acute respiratory failure with hypoxia: Secondary | ICD-10-CM | POA: Diagnosis not present

## 2020-05-18 LAB — BASIC METABOLIC PANEL
Anion gap: 15 (ref 5–15)
Anion gap: 14 (ref 5–15)
BUN: 47 mg/dL — ABNORMAL HIGH (ref 6–20)
BUN: 54 mg/dL — ABNORMAL HIGH (ref 6–20)
CO2: 32 mmol/L (ref 22–32)
CO2: 34 mmol/L — ABNORMAL HIGH (ref 22–32)
Calcium: 8.9 mg/dL (ref 8.9–10.3)
Calcium: 8.9 mg/dL (ref 8.9–10.3)
Chloride: 103 mmol/L (ref 98–111)
Chloride: 102 mmol/L (ref 98–111)
Creatinine, Ser: 1.09 mg/dL (ref 0.61–1.24)
Creatinine, Ser: 1.22 mg/dL (ref 0.61–1.24)
GFR calc non Af Amer: >60 mL/min (ref >60)
GFR, Estimated: >60 mL/min (ref >60)
Glucose, Bld: 202 mg/dL — ABNORMAL HIGH (ref 70–99)
Glucose, Bld: 182 mg/dL — ABNORMAL HIGH (ref 70–99)
Potassium: 3.5 mmol/L (ref 3.5–5.1)
Potassium: 3.2 mmol/L — ABNORMAL LOW (ref 3.5–5.1)
Sodium: 150 mmol/L — ABNORMAL HIGH (ref 135–145)
Sodium: 150 mmol/L — ABNORMAL HIGH (ref 135–145)

## 2020-05-18 LAB — CBC WITH DIFFERENTIAL/PLATELET
Abs Immature Granulocytes: 0.1 10*3/uL — ABNORMAL HIGH (ref 0.00–0.07)
Basophils Absolute: 0 10*3/uL (ref 0.0–0.1)
Basophils Relative: 0 %
Eosinophils Absolute: 0 10*3/uL (ref 0.0–0.5)
Eosinophils Relative: 1 %
HCT: 30 % — ABNORMAL LOW (ref 39.0–52.0)
Hemoglobin: 8.7 g/dL — ABNORMAL LOW (ref 13.0–17.0)
Immature Granulocytes: 1 %
Lymphocytes Relative: 18 %
Lymphs Abs: 1.5 10*3/uL (ref 0.7–4.0)
MCH: 27.6 pg (ref 26.0–34.0)
MCHC: 29 g/dL — ABNORMAL LOW (ref 30.0–36.0)
MCV: 95.2 fL (ref 80.0–100.0)
Monocytes Absolute: 0.5 10*3/uL (ref 0.1–1.0)
Monocytes Relative: 6 %
Neutro Abs: 5.8 10*3/uL (ref 1.7–7.7)
Neutrophils Relative %: 74 %
Platelets: 201 10*3/uL (ref 150–400)
RBC: 3.15 MIL/uL — ABNORMAL LOW (ref 4.22–5.81)
RDW: 16.7 % — ABNORMAL HIGH (ref 11.5–15.5)
WBC: 7.9 10*3/uL (ref 4.0–10.5)
nRBC: 0.6 % — ABNORMAL HIGH (ref 0.0–0.2)

## 2020-05-18 LAB — GLUCOSE, CAPILLARY
Glucose-Capillary: 150 mg/dL — ABNORMAL HIGH (ref 70–99)
Glucose-Capillary: 151 mg/dL — ABNORMAL HIGH (ref 70–99)
Glucose-Capillary: 151 mg/dL — ABNORMAL HIGH (ref 70–99)
Glucose-Capillary: 158 mg/dL — ABNORMAL HIGH (ref 70–99)
Glucose-Capillary: 159 mg/dL — ABNORMAL HIGH (ref 70–99)
Glucose-Capillary: 165 mg/dL — ABNORMAL HIGH (ref 70–99)
Glucose-Capillary: 169 mg/dL — ABNORMAL HIGH (ref 70–99)
Glucose-Capillary: 182 mg/dL — ABNORMAL HIGH (ref 70–99)
Glucose-Capillary: 188 mg/dL — ABNORMAL HIGH (ref 70–99)
Glucose-Capillary: 189 mg/dL — ABNORMAL HIGH (ref 70–99)
Glucose-Capillary: 190 mg/dL — ABNORMAL HIGH (ref 70–99)
Glucose-Capillary: 191 mg/dL — ABNORMAL HIGH (ref 70–99)
Glucose-Capillary: 200 mg/dL — ABNORMAL HIGH (ref 70–99)
Glucose-Capillary: 217 mg/dL — ABNORMAL HIGH (ref 70–99)
Glucose-Capillary: 218 mg/dL — ABNORMAL HIGH (ref 70–99)
Glucose-Capillary: 227 mg/dL — ABNORMAL HIGH (ref 70–99)
Glucose-Capillary: 254 mg/dL — ABNORMAL HIGH (ref 70–99)

## 2020-05-18 LAB — PHOSPHORUS: Phosphorus: 2.2 mg/dL — ABNORMAL LOW (ref 2.5–4.6)

## 2020-05-18 LAB — MAGNESIUM: Magnesium: 2.5 mg/dL — ABNORMAL HIGH (ref 1.7–2.4)

## 2020-05-18 IMAGING — DX DG CHEST 1V PORT
2 series · 2 of 2 positions shown · non-contrast
Comparison: [DATE].

CLINICAL DATA: Acute respiratory failure.

EXAM:
PORTABLE CHEST 1 VIEW

[chest ap (1 of 2)]
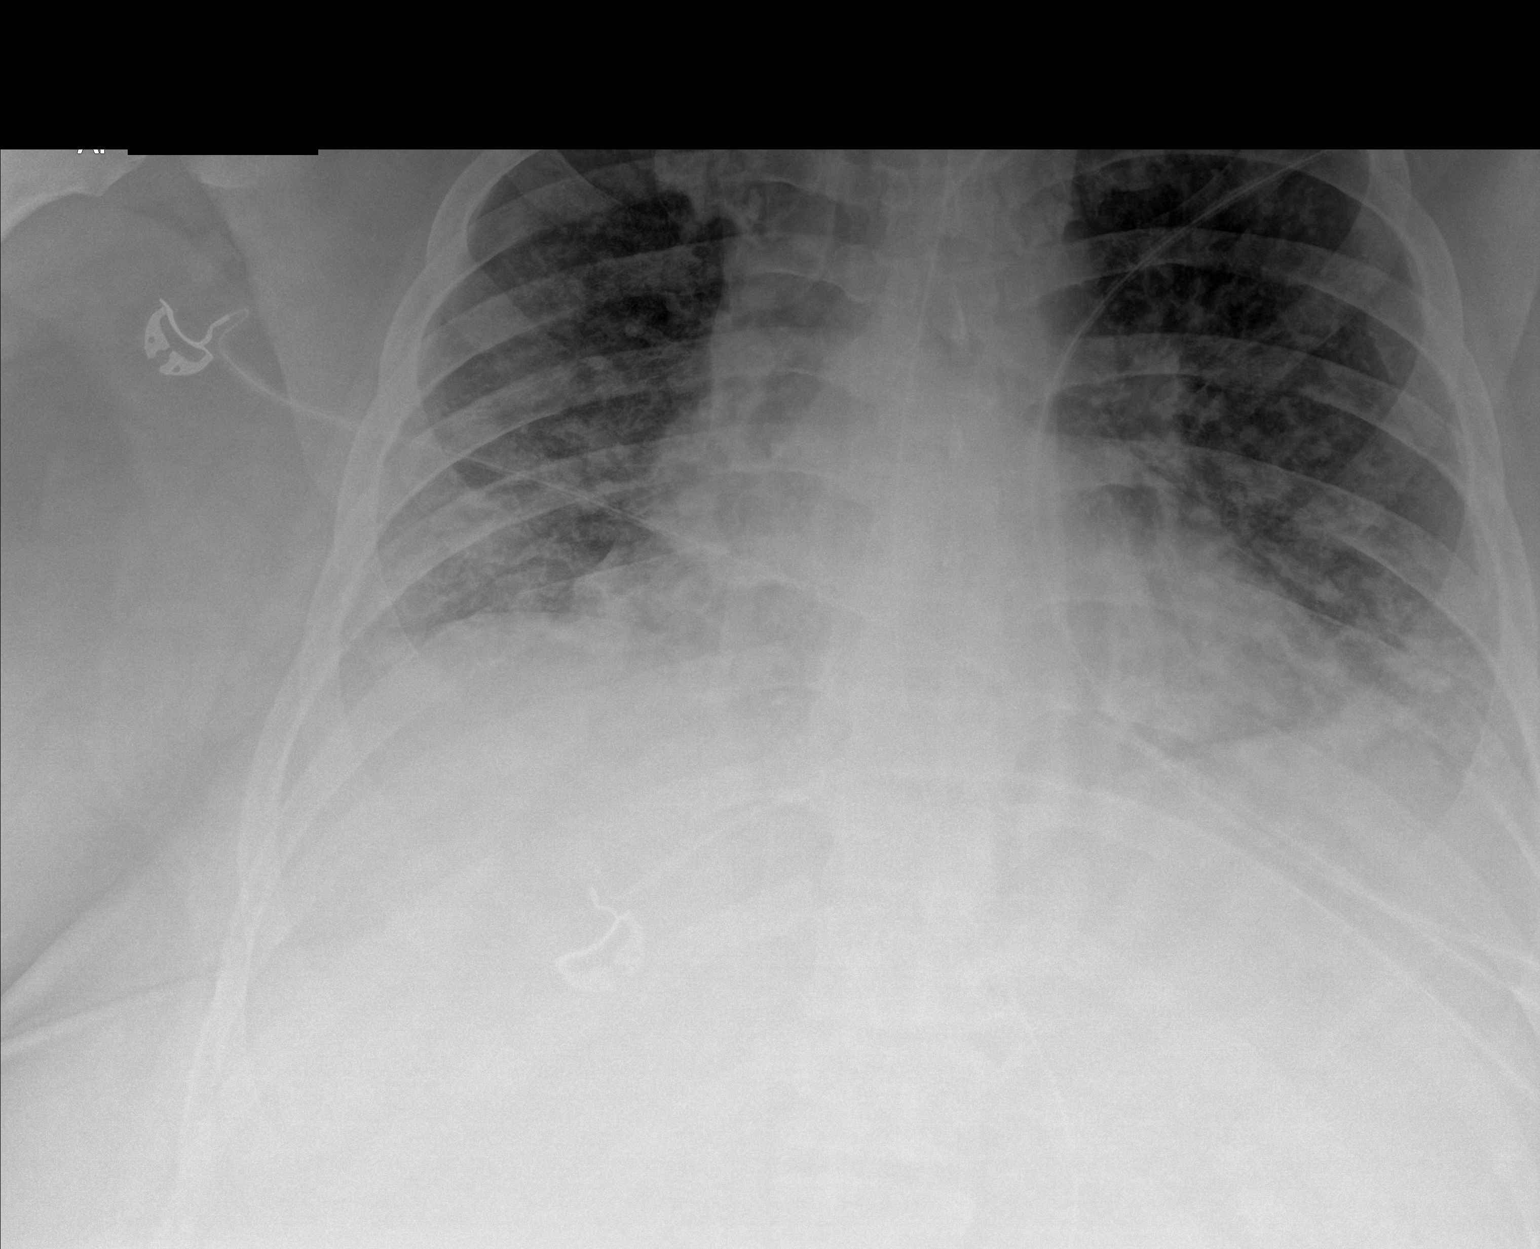

[chest ap (2 of 2)]
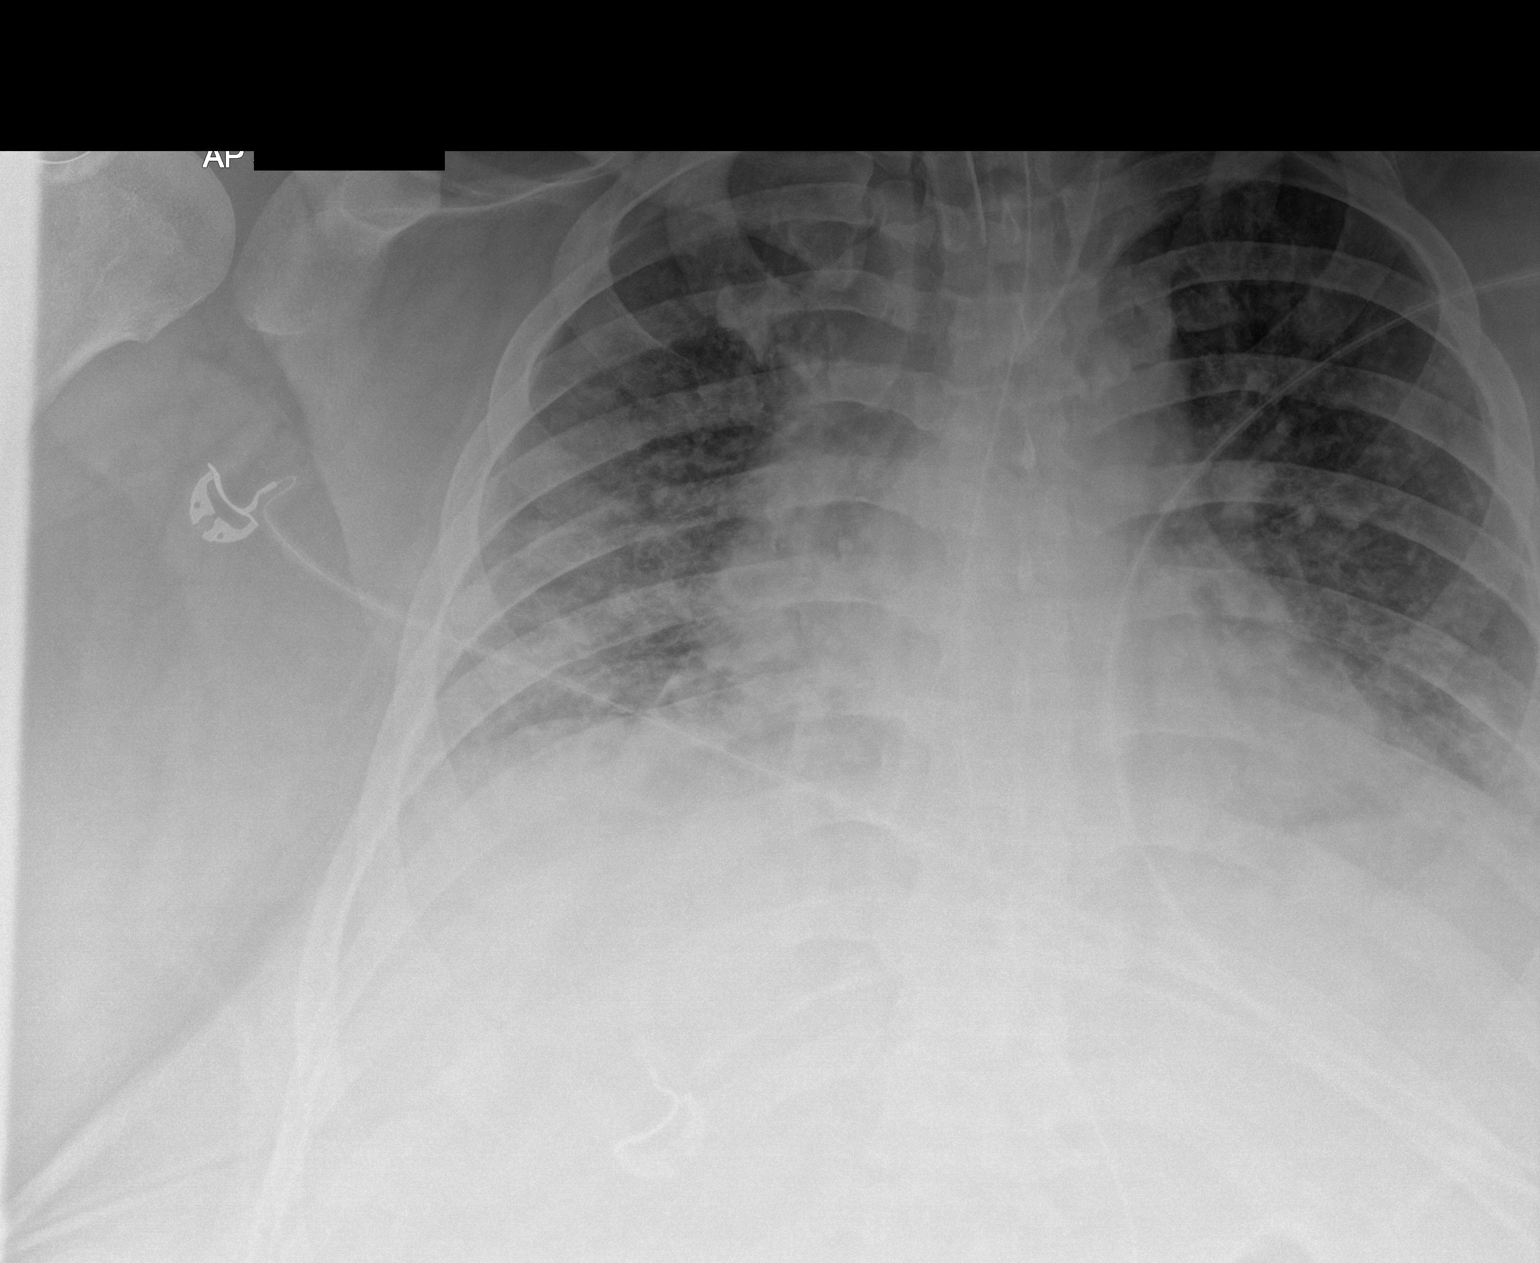

[2 of 2 positions shown; findings below may reference images not displayed]

FINDINGS: Endotracheal tube and NG tube in stable position. Heart size stable.
Persistent but significant improved bilateral pulmonary
infiltrates/edema. No pleural effusion or pneumothorax.
IMPRESSION: 1. Endotracheal tube and NG tube in stable position.
2. Persistent but significant improved bilateral pulmonary
infiltrates/edema.

## 2020-05-18 MED ORDER — FENTANYL 2500MCG IN NS 250ML (10MCG/ML) PREMIX INFUSION
0.0000 ug/h | INTRAVENOUS | Status: DC
  Administered 2020-05-18: 400 ug/h via INTRAVENOUS
  Administered 2020-05-18: 300 ug/h via INTRAVENOUS
  Administered 2020-05-18 – 2020-05-20 (×8): 400 ug/h via INTRAVENOUS
  Filled 2020-05-18 (×10): qty 250

## 2020-05-18 MED ORDER — SODIUM CHLORIDE 0.9% FLUSH
10.0000 mL | INTRAVENOUS | Status: DC | PRN
  Administered 2020-05-28 – 2020-05-30 (×2): 10 mL

## 2020-05-18 MED ORDER — INSULIN ASPART 100 UNIT/ML ~~LOC~~ SOLN
15.0000 [IU] | SUBCUTANEOUS | Status: DC
  Administered 2020-05-18 – 2020-05-20 (×11): 15 [IU] via SUBCUTANEOUS
  Filled 2020-05-18 (×12): qty 1

## 2020-05-18 MED ORDER — SODIUM CHLORIDE 0.9% FLUSH
10.0000 mL | Freq: Two times a day (BID) | INTRAVENOUS | Status: DC
  Administered 2020-05-18: 30 mL
  Administered 2020-05-19 – 2020-05-23 (×8): 10 mL
  Administered 2020-05-23: 20 mL
  Administered 2020-05-24 – 2020-05-25 (×3): 10 mL
  Administered 2020-05-25: 13 mL
  Administered 2020-05-26 – 2020-05-27 (×3): 10 mL
  Administered 2020-05-27: 20 mL

## 2020-05-18 MED ORDER — VITAL 1.5 CAL PO LIQD
1000.0000 mL | ORAL | Status: DC
  Administered 2020-05-18 – 2020-05-20 (×3): 1000 mL

## 2020-05-18 MED ORDER — INSULIN DETEMIR 100 UNIT/ML ~~LOC~~ SOLN
60.0000 [IU] | Freq: Two times a day (BID) | SUBCUTANEOUS | Status: DC
  Administered 2020-05-19 – 2020-05-20 (×4): 60 [IU] via SUBCUTANEOUS
  Filled 2020-05-18 (×6): qty 0.6

## 2020-05-18 MED ORDER — POTASSIUM CHLORIDE 20 MEQ PO PACK
40.0000 meq | PACK | Freq: Once | ORAL | Status: AC
  Administered 2020-05-18: 40 meq

## 2020-05-18 MED ORDER — INSULIN DETEMIR 100 UNIT/ML ~~LOC~~ SOLN
60.0000 [IU] | Freq: Once | SUBCUTANEOUS | Status: AC
  Administered 2020-05-18: 60 [IU] via SUBCUTANEOUS
  Filled 2020-05-18: qty 0.6

## 2020-05-18 MED ORDER — INSULIN ASPART 100 UNIT/ML ~~LOC~~ SOLN
0.0000 [IU] | SUBCUTANEOUS | Status: DC
  Administered 2020-05-18: 7 [IU] via SUBCUTANEOUS
  Administered 2020-05-18 – 2020-05-19 (×2): 11 [IU] via SUBCUTANEOUS
  Administered 2020-05-19: 4 [IU] via SUBCUTANEOUS
  Administered 2020-05-19: 11 [IU] via SUBCUTANEOUS
  Administered 2020-05-19: 7 [IU] via SUBCUTANEOUS
  Administered 2020-05-19: 11 [IU] via SUBCUTANEOUS
  Administered 2020-05-19: 7 [IU] via SUBCUTANEOUS
  Administered 2020-05-20 (×3): 3 [IU] via SUBCUTANEOUS
  Administered 2020-05-21: 4 [IU] via SUBCUTANEOUS
  Administered 2020-05-21 (×3): 3 [IU] via SUBCUTANEOUS
  Administered 2020-05-22: 7 [IU] via SUBCUTANEOUS
  Administered 2020-05-22: 4 [IU] via SUBCUTANEOUS
  Administered 2020-05-22: 3 [IU] via SUBCUTANEOUS
  Administered 2020-05-23: 4 [IU] via SUBCUTANEOUS
  Administered 2020-05-23 (×2): 7 [IU] via SUBCUTANEOUS
  Administered 2020-05-23 – 2020-05-24 (×3): 3 [IU] via SUBCUTANEOUS
  Administered 2020-05-24 (×2): 4 [IU] via SUBCUTANEOUS
  Administered 2020-05-24: 3 [IU] via SUBCUTANEOUS
  Administered 2020-05-25: 4 [IU] via SUBCUTANEOUS
  Administered 2020-05-25: 3 [IU] via SUBCUTANEOUS
  Administered 2020-05-25: 4 [IU] via SUBCUTANEOUS
  Administered 2020-05-25 (×3): 3 [IU] via SUBCUTANEOUS
  Administered 2020-05-26 (×2): 4 [IU] via SUBCUTANEOUS
  Administered 2020-05-26 – 2020-05-27 (×7): 3 [IU] via SUBCUTANEOUS
  Administered 2020-05-27: 4 [IU] via SUBCUTANEOUS
  Administered 2020-05-27 – 2020-05-28 (×4): 3 [IU] via SUBCUTANEOUS
  Administered 2020-05-28: 4 [IU] via SUBCUTANEOUS
  Administered 2020-05-28: 3 [IU] via SUBCUTANEOUS
  Administered 2020-05-29 (×2): 4 [IU] via SUBCUTANEOUS
  Administered 2020-05-29 (×2): 3 [IU] via SUBCUTANEOUS
  Administered 2020-05-30: 11 [IU] via SUBCUTANEOUS
  Administered 2020-05-30 (×2): 3 [IU] via SUBCUTANEOUS
  Administered 2020-05-30 – 2020-05-31 (×2): 7 [IU] via SUBCUTANEOUS
  Administered 2020-05-31: 3 [IU] via SUBCUTANEOUS
  Administered 2020-05-31: 4 [IU] via SUBCUTANEOUS
  Administered 2020-06-01: 3 [IU] via SUBCUTANEOUS
  Filled 2020-05-18 (×64): qty 1

## 2020-05-18 NOTE — Progress Notes (Signed)
PHARMACY CONSULT NOTE  Pharmacy Consult for Electrolyte Monitoring and Replacement   Recent Labs: Potassium (mmol/L)  Date Value  05/18/2020 3.5   Magnesium (mg/dL)  Date Value  70/92/9574 2.5 (H)   Calcium (mg/dL)  Date Value  73/40/3709 8.9   Albumin (g/dL)  Date Value  64/38/3818 2.8 (L)   Phosphorus (mg/dL)  Date Value  40/37/5436 2.2 (L)   Sodium (mmol/L)  Date Value  05/18/2020 150 (H)   Corrected Calcium: 10.2 mg/dL 2nd potassium level at 1230: 3.2 mmol/L  Assessment: Pharmacy has been consulted for electrolyte replacement in this 34 YOM requiring intubation and admission to the CCU for COVID PNA. Treatment including IV decadron, leading to the need for high amounts of insulin and eventually an insulin drip which has had to be restarted following several failed attempts at transitioning to subcutaneous insulin. He is again being transitioned to subcutaneous insulin today.   Free water: 300 mL every 2 hours Lasix 80mg  IV BID  Goal of Therapy:  Electrolytes WNL  Plan:   40 mEq KCl per tube x 1  Follow-up electrolytes in am  , PharmD 05/18/2020 7:05 AM

## 2020-05-18 NOTE — Progress Notes (Signed)
Inpatient Diabetes Program Recommendations  AACE/ADA: New Consensus Statement on Inpatient Glycemic Control (2015)  Target Ranges:  Prepandial:   less than 140 mg/dL      Peak postprandial:   less than 180 mg/dL (1-2 hours)      Critically ill patients:  140 - 180 mg/dL   Results for JURIS, GOSNELL (MRN [id number]) as of 05/18/2020 07:06  Ref. Range 05/17/2020 23:28 05/18/2020 00:28 05/18/2020 02:16 05/18/2020 05:26 05/18/2020 06:22  Glucose-Capillary Latest Ref Range: 70 - 99 mg/dL 154 (H)  10 units/hr 165 (H)  11.5 units/hr 158 (H)  10 units/hr 151 (H)  7.5 units/hr 150 (H)  9.5 units/hr    Current Orders: IV Insulin Drip  Tube Feeds running 70cc/hr.    Per the ICU Glycemic Control Protocol, patient can transition to SQ Insulin from the IV Insulin drip when all of the following are met: -IV Insulin Drip rate is <8units / hour -Tube feeds are at goal rate and stable -Four consecutiveCBG readings <180 mg/dL   Patient has met 2 of the 3 criteria, however, IV Insulin Drip rates remain >8 units/hr  Recommend leaving pt on the IV Insulin Drip given his severe illness   However, if decision made to transition back to SQ Insulin, pt will likely need large amounts of Insulin to maintain CBG control.  Has been getting approximately 10 units/hr for the last 6 hours which would translate to about 240 units per 24 hour period.  Would give half of that as a basal dose and the other half to cover his tube feedings -Levemir 60 units BID -Novolog 3-6-9 units Q4 hours (per the ICU Glycemic Control order set) -Novolog 15 units Q4 hours for tube feed coverage (HOLD if tube feeds HELD for any reason)     --Will follow patient during hospitalization--  Wyn Quaker RN, MSN, CDE Diabetes Coordinator Inpatient Glycemic Control Team Team Pager: [phone] (8a-5p)

## 2020-05-18 NOTE — Progress Notes (Signed)
Went to patient room to place PICC line. Noted to have Temp of 103 F. Spoke to  Kasa,K MD, Ok for [employer]. PIV x2 started, Midline placed. RN aware not to use Midline on vesicant medication. PICC consent signed by mom in chart. Will follow up.

## 2020-05-18 NOTE — Progress Notes (Signed)
CRITICAL CARE NOTE  34 yo with OSA, morbid obesity BMI>56 came in with flu like illness diagnosed with COVID19 pneumonia was initially ion Medical floor with hospitalist service initiated Remdesevir and barictitinib, he progressively became more dyspneic he was placed on BIPAP and did not tolerate then placed on HFNC did not tolerate patient himself consented to MV and was intubated in MICU. Mother was notified who is sick at home with Collin Brown.   Events:  9/27- patient on 65%FiO2 with proning protocol. CVP trending, goal negative fluid balance.  05/08/20- patient with worsening ventilation increased FiO2 to 100% overnight and had episode of severe bradycardia. Concern for mucus plugging will perform recruitment maneuvers and possible bronchoscopy. We were able to perform tracheal aspirate and have weaned down fIO2 to 75% 05/09/20- patient had bradycardic episode and he desaturated overnight with temporary increase to 100FiO2. Lasix q12 going. I spoke to brother today for update. His tracheal aspirate came back normal flora so we will stop antibiotic.  05/10/20-patient with pneumomediastinum and on maximal setting on ventilator. Poor prognosis very unfortunate young man. I called and updated Mother today Collin Brown.  05/11/20- patient weaned to 80%. Will continue with weaning and proning protocol  05/12/20-patient remains critically ill. Mother at bedside we discussed his care plan, she is with strong faith and continues to pray for her son to improve.  05/13/20- patient remains crtically ill. Proning per protocol. No significant events overnight.  05/14/20- Continues to be febrile, FiO2 up to 100%, improved down to 60% after increasing PEEP from 10 to 16.  05/15/20- patient remains crtically ill, overall status unchanged 05/16/20-persistent fevers, CT head and chest, does have sinus opacification. 10/7 severe ARDS 10/8 febrile, severe ARDS  CC  follow up respiratory failure  SUBJECTIVE Patient  remains critically ill Prognosis is guarded  Vent Mode: PRVC FiO2 (%):  [40 %-50 %] 50 % Set Rate:  [35 bmp-352 bmp] 352 bmp Vt Set:  [460 mL] 460 mL PEEP:  [10 cmH20-16 cmH20] 10 cmH20 Plateau Pressure:  [24 cmH20] 24 cmH20 CBC    Component Value Date/Time   WBC 7.9 05/18/2020 0402   RBC 3.15 (L) 05/18/2020 0402   HGB 8.7 (L) 05/18/2020 0402   HCT 30.0 (L) 05/18/2020 0402   PLT 201 05/18/2020 0402   MCV 95.2 05/18/2020 0402   MCH 27.6 05/18/2020 0402   MCHC 29.0 (L) 05/18/2020 0402   RDW 16.7 (H) 05/18/2020 0402   LYMPHSABS 1.5 05/18/2020 0402   MONOABS 0.5 05/18/2020 0402   EOSABS 0.0 05/18/2020 0402   BASOSABS 0.0 05/18/2020 0402   BMP Latest Ref Rng & Units 05/18/2020 05/17/2020 05/17/2020  Glucose 70 - 99 mg/dL 202(H) 174(H) 320(H)  BUN 6 - 20 mg/dL 47(H) 38(H) 39(H)  Creatinine 0.61 - 1.24 mg/dL 1.09 0.85 0.75  Sodium 135 - 145 mmol/L 150(H) 150(H) 149(H)  Potassium 3.5 - 5.1 mmol/L 3.5 3.5 3.0(L)  Chloride 98 - 111 mmol/L 103 100 98  CO2 22 - 32 mmol/L 32 39(H) 36(H)  Calcium 8.9 - 10.3 mg/dL 8.9 9.1 9.2      BP (!) 110/55   Pulse 90   Temp (!) 102.4 F (39.1 C) (Rectal)   Resp 19   Ht 5' 10" (1.778 m)   Wt (!) 171.3 kg   SpO2 (!) 89%   BMI 54.19 kg/m    I/O last 3 completed shifts: In: 13480.5 [I.V.:5500.5; Other:110; NG/GT:6720; IV Piggyback:1149.9] Out: 9937 [Urine:6985] No intake/output data recorded.  SpO2: (!) 89 % O2  Flow Rate (L/min): 50 L/min FiO2 (%): 50 %  Estimated body mass index is 54.19 kg/m as calculated from the following:   Height as of this encounter: 5' 10" (1.778 m).   Weight as of this encounter: 171.3 kg.  SIGNIFICANT EVENTS   REVIEW OF SYSTEMS  PATIENT IS UNABLE TO PROVIDE COMPLETE REVIEW OF SYSTEMS DUE TO SEVERE CRITICAL ILLNESS   Pressure Injury 05/12/20 Toe (Comment  which one) Anterior;Right Stage 2 -  Partial thickness loss of dermis presenting as a shallow open injury with a red, pink wound bed without slough.  (Active)  05/12/20 1930  Location: Toe (Comment  which one)  Location Orientation: Anterior;Right  Staging: Stage 2 -  Partial thickness loss of dermis presenting as a shallow open injury with a red, pink wound bed without slough.  Wound Description (Comments):   Present on Admission: No     Pressure Injury 05/12/20 Toe (Comment  which one) Anterior;Left Stage 2 -  Partial thickness loss of dermis presenting as a shallow open injury with a red, pink wound bed without slough. (Active)  05/12/20 1930  Location: Toe (Comment  which one)  Location Orientation: Anterior;Left  Staging: Stage 2 -  Partial thickness loss of dermis presenting as a shallow open injury with a red, pink wound bed without slough.  Wound Description (Comments):   Present on Admission: No    COVID-19 DISASTER DECLARATION:   FULL CONTACT PHYSICAL EXAMINATION WAS NOT POSSIBLE DUE TO TREATMENT OF COVID-19  AND CONSERVATION OF PERSONAL PROTECTIVE EQUIPMENT, LIMITED EXAM FINDINGS INCLUDE-   PHYSICAL EXAMINATION:  GENERAL:critically ill appearing, +resp distress NEUROLOGIC: obtunded, GCS<8   Patient assessed or the symptoms described in the history of present illness.  In the context of the Global COVID-19 pandemic, which necessitated consideration that the patient might be at risk for infection with the SARS-CoV-2 virus that causes COVID-19, Institutional protocols and algorithms that pertain to the evaluation of patients at risk for COVID-19 are in a state of rapid change based on information released by regulatory bodies including the CDC and federal and state organizations. These policies and algorithms were followed during the patient's care while in hospital.    MEDICATIONS: I have reviewed all medications and confirmed regimen as documented   CULTURE RESULTS   Recent Results (from the past 240 hour(s))  CULTURE, BLOOD (ROUTINE X 2) w Reflex to ID Panel     Status: None   Collection Time: 05/10/20 12:47 PM    Specimen: BLOOD  Result Value Ref Range Status   Specimen Description BLOOD BLOOD RIGHT HAND  Final   Special Requests   Final    BOTTLES DRAWN AEROBIC AND ANAEROBIC Blood Culture adequate volume   Culture   Final    NO GROWTH 5 DAYS Performed at [facility], [address] [place], [place] [address]    Report Status 05/15/2020 FINAL  Final  CULTURE, BLOOD (ROUTINE X 2) w Reflex to ID Panel     Status: None   Collection Time: 05/10/20  1:21 PM   Specimen: BLOOD  Result Value Ref Range Status   Specimen Description BLOOD BLOOD RIGHT HAND  Final   Special Requests   Final    BOTTLES DRAWN AEROBIC AND ANAEROBIC Blood Culture adequate volume   Culture   Final    NO GROWTH 5 DAYS Performed at [facility], [address] [place], [place] [address]    Report Status 05/15/2020 FINAL  Final  Culture, respiratory (non-expectorated)  Status: None   Collection Time: 05/14/20  8:58 AM   Specimen: Tracheal Aspirate; Respiratory  Result Value Ref Range Status   Specimen Description   Final    TRACHEAL ASPIRATE Performed at [facility], [address], [place], [place] [address]    Special Requests   Final    NONE Performed at [facility], [place], [place] [address]    Gram Stain   Final    RARE WBC PRESENT,BOTH PMN AND MONONUCLEAR NO ORGANISMS SEEN    Culture   Final    RARE Normal respiratory flora-no Staph aureus or Pseudomonas seen Performed at [facility] [address], [place], [place] [address]    Report Status 05/16/2020 FINAL  Final  CULTURE, BLOOD (ROUTINE X 2) w Reflex to ID Panel     Status: None (Preliminary result)   Collection Time: 05/14/20  9:33 AM   Specimen: BLOOD LEFT HAND  Result Value Ref Range Status   Specimen Description BLOOD LEFT HAND  Final   Special Requests   Final    BOTTLES DRAWN AEROBIC AND ANAEROBIC Blood Culture adequate volume   Culture   Final    NO GROWTH 4  DAYS Performed at [facility], [address] [place], [place] [address]    Report Status PENDING  Incomplete  CULTURE, BLOOD (ROUTINE X 2) w Reflex to ID Panel     Status: None (Preliminary result)   Collection Time: 05/14/20 10:17 AM   Specimen: BLOOD LEFT HAND  Result Value Ref Range Status   Specimen Description BLOOD LEFT HAND  Final   Special Requests   Final    BOTTLES DRAWN AEROBIC AND ANAEROBIC Blood Culture adequate volume   Culture   Final    NO GROWTH 4 DAYS Performed at [facility], [address] [place], [place] [address]    Report Status PENDING  Incomplete  Urine Culture     Status: None   Collection Time: 05/14/20  2:33 PM   Specimen: Urine, Random  Result Value Ref Range Status   Specimen Description   Final    URINE, RANDOM Performed at [facility], [address], [place], [place] [address]    Special Requests   Final    NONE Performed at [facility], [address] [place], [place] [address]    Culture   Final    NO GROWTH Performed at [facility], [place] [address], [place], [place] [address]    Report Status 05/15/2020 FINAL  Final          IMAGING    US Venous Img Lower Bilateral (DVT)  Result Date: 05/17/2020 CLINICAL DATA:  Respiratory failure EXAM: BILATERAL LOWER EXTREMITY VENOUS DOPPLER ULTRASOUND TECHNIQUE: Gray-scale sonography with compression, as well as color and duplex ultrasound, were performed to evaluate the deep venous system(s) from the level of the common femoral vein through the popliteal and proximal calf veins. COMPARISON:  None. FINDINGS: VENOUS Normal compressibility of the common femoral, superficial femoral, and popliteal veins, as well as the visualized calf veins. Visualized portions of profunda femoral vein and great saphenous vein unremarkable. No filling defects to suggest DVT on grayscale or color Doppler imaging. Doppler waveforms show normal direction of venous flow,  normal respiratory plasticity and response to augmentation. OTHER None. Limitations: none IMPRESSION: Negative. Electronically Signed   By: Constance Holster M.D.   On: 05/17/2020 18:35   US Venous Img Upper Bilat (  DVT)  Result Date: 05/17/2020 CLINICAL DATA:  Respiratory failure EXAM: BILATERAL UPPER EXTREMITY VENOUS DOPPLER ULTRASOUND TECHNIQUE: Gray-scale sonography with graded compression, as well as color Doppler and duplex ultrasound were performed to evaluate the bilateral upper extremity deep venous systems from the level of the subclavian vein and including the jugular, axillary, basilic, radial, ulnar and upper cephalic vein. Spectral Doppler was utilized to evaluate flow at rest and with distal augmentation maneuvers. COMPARISON:  None. FINDINGS: RIGHT UPPER EXTREMITY Internal Jugular Vein: No evidence of thrombus. Normal compressibility, respiratory phasicity and response to augmentation. Subclavian Vein: No evidence of thrombus. Normal compressibility, respiratory phasicity and response to augmentation. Axillary Vein: No evidence of thrombus. Normal compressibility, respiratory phasicity and response to augmentation. Cephalic Vein: No evidence of thrombus. Normal compressibility, respiratory phasicity and response to augmentation. Basilic Vein: No evidence of thrombus. Normal compressibility, respiratory phasicity and response to augmentation. Brachial Veins: No evidence of thrombus. Normal compressibility, respiratory phasicity and response to augmentation. Radial Veins: No evidence of thrombus. Normal compressibility, respiratory phasicity and response to augmentation. Ulnar Veins: No evidence of thrombus. Normal compressibility, respiratory phasicity and response to augmentation. Other Findings:  None. LEFT UPPER EXTREMITY Internal Jugular Vein: Evaluation of the left internal jugular vein is limited by the presence of a central venous catheter. No DVT was identified at this level given this  limitation. Subclavian Vein: No evidence of thrombus. Normal compressibility, respiratory phasicity and response to augmentation. Axillary Vein: No evidence of thrombus. Normal compressibility, respiratory phasicity and response to augmentation. Cephalic Vein: No evidence of thrombus. Normal compressibility, respiratory phasicity and response to augmentation. Basilic Vein: No evidence of thrombus. Normal compressibility, respiratory phasicity and response to augmentation. Brachial Veins: No evidence of thrombus. Normal compressibility, respiratory phasicity and response to augmentation. Radial Veins: No evidence of thrombus. Normal compressibility, respiratory phasicity and response to augmentation. Ulnar Veins: No evidence of thrombus. Normal compressibility, respiratory phasicity and response to augmentation. Other Findings:  None. IMPRESSION: No evidence of DVT within either upper extremity. Electronically Signed   By: Constance Holster M.D.   On: 05/17/2020 18:36   DG Chest Port 1 View  Result Date: 05/18/2020 CLINICAL DATA:  Acute respiratory failure. EXAM: PORTABLE CHEST 1 VIEW COMPARISON:  05/14/2020. FINDINGS: Endotracheal tube and NG tube in stable position. Heart size stable. Persistent but significant improved bilateral pulmonary infiltrates/edema. No pleural effusion or pneumothorax. IMPRESSION: 1. Endotracheal tube and NG tube in stable position. 2. Persistent but significant improved bilateral pulmonary infiltrates/edema. Electronically Signed   By: Marcello Moores  Register   On: 05/18/2020 05:07   [place] EKG SITE RITE  Result Date: 05/17/2020 If Site Rite image not attached, placement could not be confirmed due to current cardiac rhythm.    Nutrition Status: Nutrition Problem: Inadequate oral intake Etiology: inability to eat Signs/Symptoms: NPO status Interventions: Tube feeding, Prostat, MVI     Indwelling Urinary Catheter continued, requirement due to   Reason to continue Indwelling  Urinary Catheter strict Intake/Output monitoring for hemodynamic instability   Central Line/ continued, requirement due to  Reason to continue Hood of central venous pressure or other hemodynamic parameters and poor IV access   Ventilator continued, requirement due to severe respiratory failure   Ventilator Sedation RASS 0 to -2      ASSESSMENT AND PLAN SYNOPSIS  Acute hypoxemic respiratory failure due to COVID-19 pneumonia / ARDS Mechanical ventilation via ARDS protocol, target PRVC 6 cc/kg Wean PEEP and FiO2 as able Goal plateau pressure less than 30, driving pressure  less than 15 Paralytics if necessary for vent synchrony, gas exchange Cycle prone positioning if necessary for oxygenation Deep sedation per PAD protocol, goal RASS -4, currently fentanyl, midazolam Diuresis as blood pressure and renal function can tolerate, goal CVP 5-8.   diuresis as tolerated based on Kidney function VAP prevention order set IV STEROIDS     Severe ACUTE Hypoxic and Hypercapnic Respiratory Failure -continue Full MV support -continue Bronchodilator Therapy -Wean Fio2 and PEEP as tolerated -will perform SAT/SBT when respiratory parameters are met -VAP/VENT bundle implementation  ACUTE DIASTOLIC CARDIAC FAILURE-  -oxygen as needed -Lasix as tolerated   Morbid obesity, possible OSA.   Will certainly impact respiratory mechanics, ventilator weaning Suspect will need to consider additional PEEP     NEUROLOGY - intubated and sedated - minimal sedation to achieve a RASS goal: -1 Wake up assessment pending   CARDIAC ICU monitoring  ID febrile episdoes -follow up cultures  GI GI PROPHYLAXIS as indicated   DIET-->TF's as tolerated Constipation protocol as indicated  ENDO - will use ICU hypoglycemic\Hyperglycemia protocol if indicated     ELECTROLYTES -follow labs as needed -replace as needed -pharmacy consultation and following   DVT/GI PRX ordered  and assessed TRANSFUSIONS AS NEEDED MONITOR FSBS I Assessed the need for Labs I Assessed the need for Foley I Assessed the need for Central Venous Line Family Discussion when available I Assessed the need for Mobilization I made an Assessment of medications to be adjusted accordingly Safety Risk assessment completed   CASE DISCUSSED IN MULTIDISCIPLINARY ROUNDS WITH ICU TEAM  Critical Care Time devoted to patient care services described in this note is 34 minutes.   Overall, patient is critically ill, prognosis is guarded.  Patient with Multiorgan failure and at high risk for cardiac arrest and death.    Collin Brown, M.D.  [facility]  Medical Director [place] Director [facility] Cardio-Pulmonary Department

## 2020-05-18 NOTE — Plan of Care (Signed)

## 2020-05-18 NOTE — Progress Notes (Addendum)
Shift summary:  - Patient remains intubated and sedated. - Weaning sedation as tolerated. - Remains on Insulin infusion. - Plan for PICC today. - LIJ CVC removed after PIV obtained by IV team.  - No PICC for now given fever.  - Foley and FMS removed as well.

## 2020-05-18 NOTE — Progress Notes (Signed)
Unable to get consent for PICC placement. RN made aware. Will follow up.

## 2020-05-18 NOTE — Progress Notes (Signed)
Nutrition Follow-up  DOCUMENTATION CODES:   Morbid obesity  INTERVENTION:  Initiate new goal TF regimen of Vital 1.5 Cal at 75 mL/hr (1800 mL goal daily volume) + PROSource 90 mL TID per tube. Provides 2940 kcal, 188 grams of protein, 1368 mL H2O daily.  NUTRITION DIAGNOSIS:   Inadequate oral intake related to inability to eat as evidenced by NPO status.  Ongoing.  GOAL:   Patient will meet greater than or equal to 90% of their needs  Met with TF regimen.  MONITOR:   Vent status, Labs, Weight trends, TF tolerance, I & O's  REASON FOR ASSESSMENT:   Ventilator, Consult Enteral/tube feeding initiation and management  ASSESSMENT:   34 year old male with PMHx of OSA admitted with COVID-19 PNA.  9/25 intubated 10/3 tube feeds held in setting of hyperglycemia 10/5 tube feeds resumed  Patient is currently intubated on ventilator support MV: 17.5 L/min Temp (24hrs), Avg:102.7 F (39.3 C), Min:101.8 F (38.8 C), Max:103.8 F (39.9 C)  Medications reviewed and include: vitamin C 500 mg daily per tube, Decadron 6 mg Q24hrs IV, Colace 100 mg BID per tube, free water 200 mL Q2hrs, Lasix 80 mg BID IV, Novolog 0-20 units Q4hrs, Novolog 15 units Q4hrs, Levemir 60 units BID, MVI daily, Miralax, potassium chloride 40 mEq once today, zinc sulfate 220 mg daily per tube, Unasyn, Precedex gtt, famotidine, fentanyl gtt, regular insulin gtt, Versed gtt.  Labs reviewed: CBG 159-191, Sodium 159, Potassium 3.2, CO2 34, BUN 54.  I/O: 4735 mL UOP yesterday (1.2 mL/kg/hr)  Weight trend: 171.3 kg on 10/6; -6.9 kg from 9/27  Enteral Access: OGT placed 9/25; terminates in upper stomach per abdominal x-ray 9/25; tube in stable position per chest x-ray today  TF regimen: Vital 1.5 Cal at 70 mL/hr + PROSource TF 90 mL TID  Discussed with RN and on rounds. Per RN unknown if weight is even accurate.  Diet Order:   Diet Order    None     EDUCATION NEEDS:   No education needs have been  identified at this time  Skin:  Skin Assessment: Skin Integrity Issues: (stage 2 bilateral toes (no commend on which toes))  Last BM:  05/17/2020  Height:   Ht Readings from Last 1 Encounters:  05/12/20 5' 10" (1.778 m)   Weight:   Wt Readings from Last 1 Encounters:  05/16/20 (!) 171.3 kg   Ideal Body Weight:  75.5 kg  BMI:  Body mass index is 54.19 kg/m.  Estimated Nutritional Needs:   Kcal:  2800-3000  Protein:  175-185 grams  Fluid:  >/= 2 L/day   King, MS, RD, LDN Pager number available on Amion 

## 2020-05-19 DIAGNOSIS — U071 COVID-19: Secondary | ICD-10-CM | POA: Diagnosis not present

## 2020-05-19 DIAGNOSIS — J1282 Pneumonia due to coronavirus disease 2019: Secondary | ICD-10-CM | POA: Diagnosis not present

## 2020-05-19 DIAGNOSIS — J9601 Acute respiratory failure with hypoxia: Secondary | ICD-10-CM | POA: Diagnosis not present

## 2020-05-19 LAB — CBC WITH DIFFERENTIAL/PLATELET
Abs Immature Granulocytes: 0.13 10*3/uL — ABNORMAL HIGH (ref 0.00–0.07)
Basophils Absolute: 0 10*3/uL (ref 0.0–0.1)
Basophils Relative: 0 %
Eosinophils Absolute: 0.1 10*3/uL (ref 0.0–0.5)
Eosinophils Relative: 1 %
HCT: 30.1 % — ABNORMAL LOW (ref 39.0–52.0)
Hemoglobin: 9 g/dL — ABNORMAL LOW (ref 13.0–17.0)
Immature Granulocytes: 1 %
Lymphocytes Relative: 15 %
Lymphs Abs: 1.4 10*3/uL (ref 0.7–4.0)
MCH: 28 pg (ref 26.0–34.0)
MCHC: 29.9 g/dL — ABNORMAL LOW (ref 30.0–36.0)
MCV: 93.5 fL (ref 80.0–100.0)
Monocytes Absolute: 0.5 10*3/uL (ref 0.1–1.0)
Monocytes Relative: 5 %
Neutro Abs: 7.4 10*3/uL (ref 1.7–7.7)
Neutrophils Relative %: 78 %
Platelets: 168 10*3/uL (ref 150–400)
RBC: 3.22 MIL/uL — ABNORMAL LOW (ref 4.22–5.81)
RDW: 17 % — ABNORMAL HIGH (ref 11.5–15.5)
WBC: 9.5 10*3/uL (ref 4.0–10.5)
nRBC: 0 % (ref 0.0–0.2)

## 2020-05-19 LAB — BLOOD GAS, ARTERIAL
Acid-Base Excess: 8.6 mmol/L — ABNORMAL HIGH (ref 0.0–2.0)
Bicarbonate: 34.7 mmol/L — ABNORMAL HIGH (ref 20.0–28.0)
FIO2: 0.5
MECHVT: 460 mL
O2 Saturation: 93.8 %
PEEP: 10 cmH2O
Patient temperature: 37
RATE: 30 resp/min
pCO2 arterial: 56 mmHg — ABNORMAL HIGH (ref 32.0–48.0)
pH, Arterial: 7.4 (ref 7.350–7.450)
pO2, Arterial: 70 mmHg — ABNORMAL LOW (ref 83.0–108.0)

## 2020-05-19 LAB — GLUCOSE, CAPILLARY
Glucose-Capillary: 258 mg/dL — ABNORMAL HIGH (ref 70–99)
Glucose-Capillary: 212 mg/dL — ABNORMAL HIGH (ref 70–99)
Glucose-Capillary: 235 mg/dL — ABNORMAL HIGH (ref 70–99)
Glucose-Capillary: 278 mg/dL — ABNORMAL HIGH (ref 70–99)
Glucose-Capillary: 251 mg/dL — ABNORMAL HIGH (ref 70–99)
Glucose-Capillary: 176 mg/dL — ABNORMAL HIGH (ref 70–99)

## 2020-05-19 LAB — PHOSPHORUS: Phosphorus: 4.5 mg/dL (ref 2.5–4.6)

## 2020-05-19 LAB — BASIC METABOLIC PANEL
Anion gap: 7 (ref 5–15)
BUN: 70 mg/dL — ABNORMAL HIGH (ref 6–20)
CO2: 34 mmol/L — ABNORMAL HIGH (ref 22–32)
Calcium: 8.5 mg/dL — ABNORMAL LOW (ref 8.9–10.3)
Chloride: 100 mmol/L (ref 98–111)
Creatinine, Ser: 1.23 mg/dL (ref 0.61–1.24)
GFR, Estimated: >60 mL/min (ref >60)
Glucose, Bld: 271 mg/dL — ABNORMAL HIGH (ref 70–99)
Potassium: 3.2 mmol/L — ABNORMAL LOW (ref 3.5–5.1)
Sodium: 141 mmol/L (ref 135–145)

## 2020-05-19 LAB — MAGNESIUM: Magnesium: 2.6 mg/dL — ABNORMAL HIGH (ref 1.7–2.4)

## 2020-05-19 MED ORDER — POTASSIUM CHLORIDE 20 MEQ PO PACK
40.0000 meq | PACK | ORAL | Status: AC
  Administered 2020-05-19 (×2): 40 meq
  Filled 2020-05-19 (×2): qty 2

## 2020-05-19 MED ORDER — MIDAZOLAM HCL 2 MG/2ML IJ SOLN
INTRAMUSCULAR | Status: AC
  Filled 2020-05-19: qty 2

## 2020-05-19 MED ORDER — MIDAZOLAM HCL 2 MG/2ML IJ SOLN
2.0000 mg | INTRAMUSCULAR | Status: DC | PRN
  Administered 2020-05-19 – 2020-05-20 (×6): 2 mg via INTRAVENOUS
  Filled 2020-05-19 (×5): qty 2

## 2020-05-19 NOTE — Progress Notes (Addendum)
Shift summary:  - Patient remains intubated and sedated.  - Afebrile this AM; temp increasing over afternoon.   - CPT performed; patient tolerated well.

## 2020-05-19 NOTE — Progress Notes (Signed)
CRITICAL CARE NOTE 34 yo with OSA, morbid obesity BMI>56 came in with flu like illness diagnosed with COVID19 pneumonia was initially ion Medical floor with hospitalist service initiated Remdesevir and barictitinib, he progressively became more dyspneic he was placed on BIPAP and did not tolerate then placed on HFNC did not tolerate patient himself consented to MV and was intubated in MICU. Mother was notified who is sick at home with COVID.   Events:  9/27- patient on 65%FiO2 with proning protocol. CVP trending, goal negative fluid balance.  05/08/20- patient with worsening ventilation increased FiO2 to 100% overnight and had episode of severe bradycardia. Concern for mucus plugging will perform recruitment maneuvers and possible bronchoscopy. We were able to perform tracheal aspirate and have weaned down fIO2 to 75% 05/09/20- patient had bradycardic episode and he desaturated overnight with temporary increase to 100FiO2. Lasix q12 going. I spoke to brother today for update. His tracheal aspirate came back normal flora so we will stop antibiotic.  05/10/20-patient with pneumomediastinum and on maximal setting on ventilator. Poor prognosis very unfortunate young man. I called and updated Mother today [name].  05/11/20- patient weaned to 80%. Will continue with weaning and proning protocol  05/12/20-patient remains critically ill. Mother at bedside we discussed his care plan, she is with strong faith and continues to pray for her son to improve.  05/13/20- patient remains crtically ill. Proning per protocol. No significant events overnight.  05/14/20- Continues to be febrile, FiO2 up to 100%, improved down to 60% after increasing PEEP from 10 to 16.  05/15/20- patient remains crtically ill, overall status unchanged 05/16/20-persistent fevers, CT head and chest, does have sinus opacification. 10/7 severe ARDS 10/8 febrile, severe ARDS 10/9 severe ARDS    CC  follow up respiratory  failure  SUBJECTIVE Patient remains critically ill Prognosis is guarded Severe ARDS    BP (!) 118/57 (BP Location: Left Wrist)   Pulse 69   Temp 98.6 F (37 C) (Rectal)   Resp 17   Ht 5\' 10"  (1.778 m)   Wt (!) 170.2 kg   SpO2 91%   BMI 53.84 kg/m    I/O last 3 completed shifts: In: 12342.6 [I.V.:5029.8; ; IV Piggyback:876.4] Out: 3950 [Urine:3950] Total I/O In: 404.9 [I.V.:99.9; NG/GT:275; IV Piggyback:30] Out: -   SpO2: 91 % O2 Flow Rate (L/min): 50 L/min FiO2 (%): 55 %  Estimated body mass index is 53.84 kg/m as calculated from the following:   Height as of this encounter: 5\' 10"  (1.778 m).   Weight as of this encounter: 170.2 kg.  SIGNIFICANT EVENTS   REVIEW OF SYSTEMS  PATIENT IS UNABLE TO PROVIDE COMPLETE REVIEW OF SYSTEMS DUE TO SEVERE CRITICAL ILLNESS   Pressure Injury 05/12/20 Toe (Comment  which one) Anterior;Right Stage 2 -  Partial thickness loss of dermis presenting as a shallow open injury with a red, pink wound bed without slough. (Active)  05/12/20 1930  Location: Toe (Comment  which one)  Location Orientation: Anterior;Right  Staging: Stage 2 -  Partial thickness loss of dermis presenting as a shallow open injury with a red, pink wound bed without slough.  Wound Description (Comments):   Present on Admission: No     Pressure Injury 05/12/20 Toe (Comment  which one) Anterior;Left Stage 2 -  Partial thickness loss of dermis presenting as a shallow open injury with a red, pink wound bed without slough. (Active)  05/12/20 1930  Location: Toe (Comment  which one)  Location Orientation: Anterior;Left  Staging: Stage  2 -  Partial thickness loss of dermis presenting as a shallow open injury with a red, pink wound bed without slough.  Wound Description (Comments):   Present on Admission: No    COVID-19 DISASTER DECLARATION:   FULL CONTACT PHYSICAL EXAMINATION WAS NOT POSSIBLE DUE TO TREATMENT OF COVID-19  AND CONSERVATION OF PERSONAL  PROTECTIVE EQUIPMENT, LIMITED EXAM FINDINGS INCLUDE-   PHYSICAL EXAMINATION:  GENERAL:critically ill appearing, +resp distress NEUROLOGIC: obtunded, GCS<8   Patient assessed or the symptoms described in the history of present illness.  In the context of the Global COVID-19 pandemic, which necessitated consideration that the patient might be at risk for infection with the SARS-CoV-2 virus that causes COVID-19, Institutional protocols and algorithms that pertain to the evaluation of patients at risk for COVID-19 are in a state of rapid change based on information released by regulatory bodies including the CDC and federal and state organizations. These policies and algorithms were followed during the patient's care while in hospital.    MEDICATIONS: I have reviewed all medications and confirmed regimen as documented   CULTURE RESULTS   Recent Results (from the past 240 hour(s))  CULTURE, BLOOD (ROUTINE X 2) w Reflex to ID Panel     Status: None   Collection Time: 05/10/20 12:47 PM   Specimen: BLOOD  Result Value Ref Range Status   Specimen Description BLOOD BLOOD RIGHT HAND  Final   Special Requests   Final    BOTTLES DRAWN AEROBIC AND ANAEROBIC Blood Culture adequate volume   Culture   Final    NO GROWTH 5 DAYS Performed at [facility], [address] [place], [place] [address]    Report Status 05/15/2020 FINAL  Final  CULTURE, BLOOD (ROUTINE X 2) w Reflex to ID Panel     Status: None   Collection Time: 05/10/20  1:21 PM   Specimen: BLOOD  Result Value Ref Range Status   Specimen Description BLOOD BLOOD RIGHT HAND  Final   Special Requests   Final    BOTTLES DRAWN AEROBIC AND ANAEROBIC Blood Culture adequate volume   Culture   Final    NO GROWTH 5 DAYS Performed at [facility], [address] [place], [place] [address]    Report Status 05/15/2020 FINAL  Final  Culture, respiratory (non-expectorated)     Status: None   Collection Time: 05/14/20  8:58  AM   Specimen: Tracheal Aspirate; Respiratory  Result Value Ref Range Status   Specimen Description   Final    TRACHEAL ASPIRATE Performed at [facility], [address] [place], [place] [address]    Special Requests   Final    NONE Performed at [facility], [address] [place], [place] [address]    Gram Stain   Final    RARE WBC PRESENT,BOTH PMN AND MONONUCLEAR NO ORGANISMS SEEN    Culture   Final    RARE Normal respiratory flora-no Staph aureus or Pseudomonas seen Performed at [facility], [address] [place], [place] [address]    Report Status 05/16/2020 FINAL  Final  CULTURE, BLOOD (ROUTINE X 2) w Reflex to ID Panel     Status: None (Preliminary result)   Collection Time: 05/14/20  9:33 AM   Specimen: BLOOD LEFT HAND  Result Value Ref Range Status   Specimen Description BLOOD LEFT HAND  Final   Special Requests   Final    BOTTLES DRAWN AEROBIC AND ANAEROBIC Blood Culture adequate volume  Culture   Final    NO GROWTH 4 DAYS Performed at [facility], [address] [place], [place] [address]    Report Status PENDING  Incomplete  CULTURE, BLOOD (ROUTINE X 2) w Reflex to ID Panel     Status: None (Preliminary result)   Collection Time: 05/14/20 10:17 AM   Specimen: BLOOD LEFT HAND  Result Value Ref Range Status   Specimen Description BLOOD LEFT HAND  Final   Special Requests   Final    BOTTLES DRAWN AEROBIC AND ANAEROBIC Blood Culture adequate volume   Culture   Final    NO GROWTH 4 DAYS Performed at [facility], [address] [place], [place] [address]    Report Status PENDING  Incomplete  Urine Culture     Status: None   Collection Time: 05/14/20  2:33 PM   Specimen: Urine, Random  Result Value Ref Range Status   Specimen Description   Final    URINE, RANDOM Performed at [facility], [address], [place], [place] [address]    Special Requests   Final    NONE Performed at [facility], [address] [place], [place] [address]    Culture   Final    NO GROWTH Performed at [facility], [address], [place], [place] [address]    Report Status 05/15/2020 FINAL  Final        Anti-infectives (From admission, onward)   Start     Dose/Rate Route Frequency Ordered Stop   05/16/20 1800  Ampicillin-Sulbactam (UNASYN) 3 g in sodium chloride 0.9 % 100 mL IVPB        3 g 200 mL/hr over 30 Minutes Intravenous Every 6 hours 05/16/20 1458     05/14/20 0600  ceFEPIme (MAXIPIME) 2 g in sodium chloride 0.9 % 100 mL IVPB  Status:  Discontinued        2 g 200 mL/hr over 30 Minutes Intravenous Every 8 hours 05/14/20 0533 05/16/20 1458   05/07/20 1200  meropenem (MERREM) 1 g in sodium chloride 0.9 % 100 mL IVPB  Status:  Discontinued        1 g 200 mL/hr over 30 Minutes Intravenous Every 8 hours 05/07/20 1020 05/09/20 1019   05/06/20 1200  vancomycin (VANCOCIN) IVPB 1000 mg/200 mL premix  Status:  Discontinued        1,000 mg 200 mL/hr over 60 Minutes Intravenous Every 8 hours 05/05/20 2330 05/07/20 1020   05/06/20 0200  vancomycin (VANCOREADY) IVPB 500 mg/100 mL       "Followed by" Linked Group Details   500 mg 100 mL/hr over 60 Minutes Intravenous  Once 05/05/20 2330 05/06/20 0454   05/06/20 0000  vancomycin (VANCOREADY) IVPB 2000 mg/400 mL       "Followed by" Linked Group Details   2,000 mg 200 mL/hr over 120 Minutes Intravenous  Once 05/05/20 2330 05/06/20 0304   05/05/20 2315  cefTRIAXone (ROCEPHIN) 2 g in sodium chloride 0.9 % 100 mL IVPB  Status:  Discontinued        2 g 200 mL/hr over 30 Minutes Intravenous Daily at 10 pm 05/05/20 2307 05/07/20 1020   05/04/20 1000  remdesivir 100 mg in sodium chloride 0.9 % 100 mL IVPB       "Followed by" Linked Group Details   100 mg 200 mL/hr over 30 Minutes Intravenous Daily 05/03/20 1718 05/07/20 0941   05/03/20 1800  remdesivir 200 mg in sodium chloride  0.9% 250 mL IVPB       "Followed by" Linked Group  Details   200 mg 580 mL/hr over 30 Minutes Intravenous Once 05/03/20 1718 05/03/20 1941       Nutrition Status: Nutrition Problem: Inadequate oral intake Etiology: inability to eat Signs/Symptoms: NPO status Interventions: Tube feeding, Prostat, MVI           Ventilator continued, requirement due to severe respiratory failure   Ventilator Sedation RASS 0 to -2      ASSESSMENT AND PLAN SYNOPSIS  Acute hypoxemic respiratory failure due to COVID-19 pneumonia / ARDS Mechanical ventilation via ARDS protocol, target PRVC 6 cc/kg Wean PEEP and FiO2 as able Goal plateau pressure less than 30, driving pressure less than 15 Paralytics if necessary for vent synchrony, gas exchange Cycle prone positioning if necessary for oxygenation Deep sedation per PAD protocol, goal RASS -4, currently fentanyl, midazolam Diuresis as blood pressure and renal function can tolerate, goal CVP 5-8.   diuresis as tolerated based on Kidney function VAP prevention order set IV STEROIDS   Severe ACUTE Hypoxic and Hypercapnic Respiratory Failure -continue Full MV support -continue Bronchodilator Therapy -Wean Fio2 and PEEP as tolerated -VAP/VENT bundle implementation Unable to wean from vent Severe hypoxia  ACUTE DIASTOLIC CARDIAC FAILURE- AFIB with RVR -oxygen as needed -Lasix as tolerated -follow up cardiology recs   Morbid obesity, possible OSA.   Will certainly impact respiratory mechanics, ventilator weaning Suspect will need to consider additional PEEP     ACUTE KIDNEY INJURY/Renal Failure -continue Foley Catheter-assess need -Avoid nephrotoxic agents -Follow urine output, BMP -Ensure adequate renal perfusion, optimize oxygenation -Renal dose medications     NEUROLOGY Acute toxic metabolic encephalopathy, need for sedation Goal RASS -2 to -3   CARDIAC ICU monitoring  ID on Unasyn Sinus infection -continue IV abx as prescibed -follow up cultures  GI GI PROPHYLAXIS  as indicated   DIET-->TF's as tolerated Constipation protocol as indicated  ENDO - will use ICU hypoglycemic\Hyperglycemia protocol if indicated     ELECTROLYTES -follow labs as needed -replace as needed -pharmacy consultation and following   DVT/GI PRX ordered and assessed TRANSFUSIONS AS NEEDED MONITOR FSBS I Assessed the need for Labs I Assessed the need for Foley I Assessed the need for Central Venous Line Family Discussion when available I Assessed the need for Mobilization I made an Assessment of medications to be adjusted accordingly Safety Risk assessment completed   CASE DISCUSSED IN MULTIDISCIPLINARY ROUNDS WITH ICU TEAM  Critical Care Time devoted to patient care services described in this note is 35 minutes.   Overall, patient is critically ill, prognosis is guarded.  Patient with Multiorgan failure and at high risk for cardiac arrest and death.   Patient will need TRACH to survive   Lucie Leather, M.D.  [facility]  Medical Director [facility] Medical Director [facility] Cardio-Pulmonary Department

## 2020-05-19 NOTE — Progress Notes (Addendum)
PHARMACY CONSULT NOTE  Pharmacy Consult for Electrolyte Monitoring and Replacement   Recent Labs: Potassium (mmol/L)  Date Value  05/19/2020 3.2 (L)   Magnesium (mg/dL)  Date Value  16/24/4695 2.6 (H)   Calcium (mg/dL)  Date Value  03/01/5749 8.5 (L)   Albumin (g/dL)  Date Value  51/83/3582 2.8 (L)   Phosphorus (mg/dL)  Date Value  51/89/8421 4.5   Sodium (mmol/L)  Date Value  05/19/2020 141   Corrected Calcium: 10.2 mg/dL 2nd potassium level at 1230: 3.2 mmol/L  Assessment: Pharmacy has been consulted for electrolyte replacement in this 34 YOM requiring intubation and admission to the CCU for COVID PNA. Treatment including IV decadron, leading to the need for high amounts of insulin and eventually an insulin drip which has had to be restarted following several failed attempts at transitioning to subcutaneous insulin. He has been transitioned to subcutaneous insulin.   Free water: 300 mL every 2 hours Lasix 80mg  IV BID  Goal of Therapy:  Electrolytes WNL  Plan:   40 mEq KCl per tube x 2  Follow-up electrolytes in am  , PharmD 05/19/2020 8:24 AM

## 2020-05-19 NOTE — Plan of Care (Signed)
  Problem: Education: Goal: Knowledge of General Education information will improve Description: Including pain rating scale, medication(s)/side effects and non-pharmacologic comfort measures Outcome: Not Progressing   Problem: Health Behavior/Discharge Planning: Goal: Ability to manage health-related needs will improve Outcome: Not Progressing   Problem: Clinical Measurements: Goal: Ability to maintain clinical measurements within normal limits will improve Outcome: Progressing   Problem: Activity: Goal: Risk for activity intolerance will decrease Outcome: Not Progressing   Problem: Nutrition: Goal: Adequate nutrition will be maintained Outcome: Progressing   Problem: Coping: Goal: Level of anxiety will decrease Outcome: Not Progressing   Problem: Elimination: Goal: Will not experience complications related to bowel motility Outcome: Progressing   Problem: Pain Managment: Goal: General experience of comfort will improve Outcome: Not Progressing   Problem: Safety: Goal: Ability to remain free from injury will improve Outcome: Not Progressing   Problem: Skin Integrity: Goal: Risk for impaired skin integrity will decrease Outcome: Not Progressing   Problem: Education: Goal: Knowledge of General Education information will improve Description: Including pain rating scale, medication(s)/side effects and non-pharmacologic comfort measures Outcome: Not Progressing   Problem: Health Behavior/Discharge Planning: Goal: Ability to manage health-related needs will improve Outcome: Not Progressing   Problem: Clinical Measurements: Goal: Ability to maintain clinical measurements within normal limits will improve Outcome: Progressing   Problem: Activity: Goal: Risk for activity intolerance will decrease Outcome: Not Progressing   Problem: Nutrition: Goal: Adequate nutrition will be maintained Outcome: Progressing   Problem: Coping: Goal: Level of anxiety will  decrease Outcome: Not Progressing   Problem: Elimination: Goal: Will not experience complications related to bowel motility Outcome: Progressing   Problem: Pain Managment: Goal: General experience of comfort will improve Outcome: Not Progressing   Problem: Safety: Goal: Ability to remain free from injury will improve Outcome: Not Progressing   Problem: Skin Integrity: Goal: Risk for impaired skin integrity will decrease Outcome: Not Progressing

## 2020-05-19 NOTE — Plan of Care (Signed)
Discussed in front of patient plan of care for the evening, pain management and temperature control with no evidence of learning.  Tylenol prn and ice packs added to cooling blanket intervention T 100.7 earlier.

## 2020-05-20 DIAGNOSIS — J9601 Acute respiratory failure with hypoxia: Secondary | ICD-10-CM | POA: Diagnosis not present

## 2020-05-20 DIAGNOSIS — J1282 Pneumonia due to coronavirus disease 2019: Secondary | ICD-10-CM | POA: Diagnosis not present

## 2020-05-20 DIAGNOSIS — G4730 Sleep apnea, unspecified: Secondary | ICD-10-CM | POA: Diagnosis not present

## 2020-05-20 DIAGNOSIS — U071 COVID-19: Secondary | ICD-10-CM | POA: Diagnosis not present

## 2020-05-20 LAB — CBC WITH DIFFERENTIAL/PLATELET
Abs Immature Granulocytes: 0.08 10*3/uL — ABNORMAL HIGH (ref 0.00–0.07)
Basophils Absolute: 0 10*3/uL (ref 0.0–0.1)
Basophils Relative: 0 %
Eosinophils Absolute: 0.1 10*3/uL (ref 0.0–0.5)
Eosinophils Relative: 1 %
HCT: 29.4 % — ABNORMAL LOW (ref 39.0–52.0)
Hemoglobin: 8.9 g/dL — ABNORMAL LOW (ref 13.0–17.0)
Immature Granulocytes: 1 %
Lymphocytes Relative: 16 %
Lymphs Abs: 1.5 10*3/uL (ref 0.7–4.0)
MCH: 27.7 pg (ref 26.0–34.0)
MCHC: 30.3 g/dL (ref 30.0–36.0)
MCV: 91.6 fL (ref 80.0–100.0)
Monocytes Absolute: 0.5 10*3/uL (ref 0.1–1.0)
Monocytes Relative: 5 %
Neutro Abs: 7.1 10*3/uL (ref 1.7–7.7)
Neutrophils Relative %: 77 %
Platelets: 181 10*3/uL (ref 150–400)
RBC: 3.21 MIL/uL — ABNORMAL LOW (ref 4.22–5.81)
RDW: 16.9 % — ABNORMAL HIGH (ref 11.5–15.5)
WBC: 9.3 10*3/uL (ref 4.0–10.5)
nRBC: 0 % (ref 0.0–0.2)

## 2020-05-20 LAB — BLOOD GAS, ARTERIAL
Acid-Base Excess: 12.3 mmol/L — ABNORMAL HIGH (ref 0.0–2.0)
Bicarbonate: 38.2 mmol/L — ABNORMAL HIGH (ref 20.0–28.0)
Expiratory PAP: 8
FIO2: 0.8
Inspiratory PAP: 16
Mode: POSITIVE
O2 Saturation: 98.2 %
Patient temperature: 37
RATE: 12 resp/min
pCO2 arterial: 55 mmHg — ABNORMAL HIGH (ref 32.0–48.0)
pH, Arterial: 7.45 (ref 7.350–7.450)
pO2, Arterial: 103 mmHg (ref 83.0–108.0)

## 2020-05-20 LAB — MAGNESIUM: Magnesium: 2.6 mg/dL — ABNORMAL HIGH (ref 1.7–2.4)

## 2020-05-20 LAB — GLUCOSE, CAPILLARY
Glucose-Capillary: 114 mg/dL — ABNORMAL HIGH (ref 70–99)
Glucose-Capillary: 144 mg/dL — ABNORMAL HIGH (ref 70–99)
Glucose-Capillary: 127 mg/dL — ABNORMAL HIGH (ref 70–99)
Glucose-Capillary: 102 mg/dL — ABNORMAL HIGH (ref 70–99)
Glucose-Capillary: 124 mg/dL — ABNORMAL HIGH (ref 70–99)

## 2020-05-20 LAB — BASIC METABOLIC PANEL
Anion gap: 10 (ref 5–15)
BUN: 61 mg/dL — ABNORMAL HIGH (ref 6–20)
CO2: 34 mmol/L — ABNORMAL HIGH (ref 22–32)
Calcium: 8.7 mg/dL — ABNORMAL LOW (ref 8.9–10.3)
Chloride: 104 mmol/L (ref 98–111)
Creatinine, Ser: 0.92 mg/dL (ref 0.61–1.24)
GFR, Estimated: >60 mL/min (ref >60)
Glucose, Bld: 136 mg/dL — ABNORMAL HIGH (ref 70–99)
Potassium: 3.4 mmol/L — ABNORMAL LOW (ref 3.5–5.1)
Sodium: 148 mmol/L — ABNORMAL HIGH (ref 135–145)

## 2020-05-20 LAB — PHOSPHORUS: Phosphorus: 3.3 mg/dL (ref 2.5–4.6)

## 2020-05-20 MED ORDER — POTASSIUM CHLORIDE 20 MEQ/15ML (10%) PO SOLN
40.0000 meq | Freq: Once | ORAL | Status: DC
  Filled 2020-05-20: qty 30

## 2020-05-20 MED ORDER — ACETAZOLAMIDE SODIUM 500 MG IJ SOLR
500.0000 mg | Freq: Once | INTRAMUSCULAR | Status: AC
  Administered 2020-05-20: 500 mg via INTRAVENOUS
  Filled 2020-05-20: qty 500

## 2020-05-20 NOTE — Progress Notes (Signed)
  SHIFT SUMMARY  -Attempted SBT after all sedation was turned off -Unable to extubate d/t increased HR and increased WOB -Fentanyl gtt resumed at 400 mch/hr    -Patient self extubated, placed on Bipap at 100 % FiO2, Kasa MD aware, oxygen saturation in the mid 90's, will continue to monitor closely -Fentanyl gtt discontinued, Precedex gtt started    -Patient remains on Bipap 80% FiO2, alert and following commands, resting comfortably & watching TV -Patients mother was updated by Dr. Belia Heman via phone call

## 2020-05-20 NOTE — Progress Notes (Signed)
Patient self extubated Patient is anxious with severe hypoxia Placed on biPAP, fio2 at 100%, started on precedex  Resp status seems tenuous at this time, will watch very closely. Will try Noninvasive vent and precedex with aggressive diuresis

## 2020-05-20 NOTE — Progress Notes (Signed)
PHARMACY CONSULT NOTE  Pharmacy Consult for Electrolyte Monitoring and Replacement   Recent Labs: Potassium (mmol/L)  Date Value  05/20/2020 3.4 (L)   Magnesium (mg/dL)  Date Value  81/85/6314 2.6 (H)   Calcium (mg/dL)  Date Value  97/09/6376 8.7 (L)   Albumin (g/dL)  Date Value  58/85/0277 2.8 (L)   Phosphorus (mg/dL)  Date Value  41/28/7867 3.3   Sodium (mmol/L)  Date Value  05/20/2020 148 (H)   Corrected Calcium: 10.2 mg/dL 2nd potassium level at 1230: 3.2 mmol/L  Assessment: Pharmacy has been consulted for electrolyte replacement in this 34 YOM requiring intubation and admission to the CCU for COVID PNA. Treatment including IV decadron, leading to the need for high amounts of insulin and eventually an insulin drip which has had to be restarted following several failed attempts at transitioning to subcutaneous insulin. He has been transitioned to subcutaneous insulin.   Free water: 300 mL every 2 hours Lasix 80mg  IV BID  Goal of Therapy:  Electrolytes WNL  Plan:   40 mEq KCl per tube x 1  Follow-up electrolytes in am  , PharmD 05/20/2020 7:46 AM

## 2020-05-20 NOTE — Progress Notes (Signed)
Patient self extubated @ 1220 HR increase to 161 and saturations decreased into the 70's.  Placed ambu bag  with mask on  patient with oxygen flow meter flush and bagged  until BiPAP could be placed on patient.  Patient saturations immediately  returned to the 90's while being bagged with 100% oxygen

## 2020-05-20 NOTE — Progress Notes (Signed)
CRITICAL CARE NOTE 34 yo with OSA, morbid obesity BMI>56 came in with flu like illness diagnosed with COVID19 pneumonia was initially ion Medical floor with hospitalist service initiated Remdesevir and barictitinib, he progressively became more dyspneic he was placed on BIPAP and did not tolerate then placed on HFNC did not tolerate patient himself consented to MV and was intubated in MICU. Mother was notified who is sick at home with [place].   Events:  9/27- patient on 65%FiO2 with proning protocol. CVP trending, goal negative fluid balance.  05/08/20- patient with worsening ventilation increased FiO2 to 100% overnight and had episode of severe bradycardia. Concern for mucus plugging will perform recruitment maneuvers and possible bronchoscopy. We were able to perform tracheal aspirate and have weaned down fIO2 to 75% 05/09/20- patient had bradycardic episode and he desaturated overnight with temporary increase to 100FiO2. Lasix q12 going. I spoke to brother today for update. His tracheal aspirate came back normal flora so we will stop antibiotic.  05/10/20-patient with pneumomediastinum and on maximal setting on ventilator. Poor prognosis very unfortunate young man. I called and updated Mother today [name].  05/11/20- patient weaned to 80%. Will continue with weaning and proning protocol  05/12/20-patient remains critically ill. Mother at bedside we discussed his care plan, she is with strong faith and continues to pray for her son to improve.  05/13/20- patient remains crtically ill. Proning per protocol. No significant events overnight.  05/14/20- Continues to be febrile, FiO2 up to 100%, improved down to 60% after increasing PEEP from 10 to 16.  05/15/20- patient remains crtically ill, overall status unchanged 05/16/20-persistent fevers, CT head and chest, does have sinus opacification. 10/7 severe ARDS 10/8 febrile, severe ARDS 10/9 severe ARDS    CC  follow up respiratory  failure  SUBJECTIVE Patient remains critically ill Prognosis is guarded Plan for SAT/SBT today   BP (!) 109/58   Pulse 95   Temp 100.1 F (37.8 C) (Esophageal)   Resp 19   Ht '5\' 10"'  (1.778 m)   Wt (!) 178.4 kg   SpO2 95%   BMI 56.43 kg/m    I/O last 3 completed shifts: In: 8560.4 [I.V.:3387.2; NG/GT:4520; IV Piggyback:653.2] Out: 5600 [Urine:5600] No intake/output data recorded.  SpO2: 95 % O2 Flow Rate (L/min): 50 L/min FiO2 (%): 45 %  Estimated body mass index is 56.43 kg/m as calculated from the following:   Height as of this encounter: '5\' 10"'  (1.778 m).   Weight as of this encounter: 178.4 kg.  SIGNIFICANT EVENTS   REVIEW OF SYSTEMS  PATIENT IS UNABLE TO PROVIDE COMPLETE REVIEW OF SYSTEMS DUE TO SEVERE CRITICAL ILLNESS   Pressure Injury 05/12/20 Toe (Comment  which one) Anterior;Right Stage 2 -  Partial thickness loss of dermis presenting as a shallow open injury with a red, pink wound bed without slough. (Active)  05/12/20 1930  Location: Toe (Comment  which one)  Location Orientation: Anterior;Right  Staging: Stage 2 -  Partial thickness loss of dermis presenting as a shallow open injury with a red, pink wound bed without slough.  Wound Description (Comments):   Present on Admission: No     Pressure Injury 05/12/20 Toe (Comment  which one) Anterior;Left Stage 2 -  Partial thickness loss of dermis presenting as a shallow open injury with a red, pink wound bed without slough. (Active)  05/12/20 1930  Location: Toe (Comment  which one)  Location Orientation: Anterior;Left  Staging: Stage 2 -  Partial thickness loss of dermis presenting as  a shallow open injury with a red, pink wound bed without slough.  Wound Description (Comments):   Present on Admission: No    COVID-19 DISASTER DECLARATION:   FULL CONTACT PHYSICAL EXAMINATION WAS NOT POSSIBLE DUE TO TREATMENT OF COVID-19  AND CONSERVATION OF PERSONAL PROTECTIVE EQUIPMENT, LIMITED EXAM FINDINGS  INCLUDE-   PHYSICAL EXAMINATION:  GENERAL:critically ill appearing, +resp distress NEUROLOGIC: obtunded, GCS<8   Patient assessed or the symptoms described in the history of present illness.  In the context of the Global COVID-19 pandemic, which necessitated consideration that the patient might be at risk for infection with the SARS-CoV-2 virus that causes COVID-19, Institutional protocols and algorithms that pertain to the evaluation of patients at risk for COVID-19 are in a state of rapid change based on information released by regulatory bodies including the CDC and federal and state organizations. These policies and algorithms were followed during the patient's care while in hospital.    MEDICATIONS: I have reviewed all medications and confirmed regimen as documented   CULTURE RESULTS   Recent Results (from the past 240 hour(s))  CULTURE, BLOOD (ROUTINE X 2) w Reflex to ID Panel     Status: None   Collection Time: 05/10/20 12:47 PM   Specimen: BLOOD  Result Value Ref Range Status   Specimen Description BLOOD BLOOD RIGHT HAND  Final   Special Requests   Final    BOTTLES DRAWN AEROBIC AND ANAEROBIC Blood Culture adequate volume   Culture   Final    NO GROWTH 5 DAYS Performed at [facility], [address] [place], [place] [address]    Report Status 05/15/2020 FINAL  Final  CULTURE, BLOOD (ROUTINE X 2) w Reflex to ID Panel     Status: None   Collection Time: 05/10/20  1:21 PM   Specimen: BLOOD  Result Value Ref Range Status   Specimen Description BLOOD BLOOD RIGHT HAND  Final   Special Requests   Final    BOTTLES DRAWN AEROBIC AND ANAEROBIC Blood Culture adequate volume   Culture   Final    NO GROWTH 5 DAYS Performed at [facility], [address], [place], [place] [address]    Report Status 05/15/2020 FINAL  Final  Culture, respiratory (non-expectorated)     Status: None   Collection Time: 05/14/20  8:58 AM   Specimen: Tracheal Aspirate;  Respiratory  Result Value Ref Range Status   Specimen Description   Final    TRACHEAL ASPIRATE Performed at [facility], [address] [place], [place] [address]    Special Requests   Final    NONE Performed at [facility], [place], [place] [address]    Gram Stain   Final    RARE WBC PRESENT,BOTH PMN AND MONONUCLEAR NO ORGANISMS SEEN    Culture   Final    RARE Normal respiratory flora-no Staph aureus or Pseudomonas seen Performed at [facility] [address] [place], [place] [address]    Report Status 05/16/2020 FINAL  Final  CULTURE, BLOOD (ROUTINE X 2) w Reflex to ID Panel     Status: None (Preliminary result)   Collection Time: 05/14/20  9:33 AM   Specimen: BLOOD LEFT HAND  Result Value Ref Range Status   Specimen Description BLOOD LEFT HAND  Final   Special Requests   Final    BOTTLES DRAWN AEROBIC AND ANAEROBIC Blood Culture adequate volume   Culture   Final    NO GROWTH 4  DAYS Performed at [facility], [address] [place], [place] [address]    Report Status PENDING  Incomplete  CULTURE, BLOOD (ROUTINE X 2) w Reflex to ID Panel     Status: None (Preliminary result)   Collection Time: 05/14/20 10:17 AM   Specimen: BLOOD LEFT HAND  Result Value Ref Range Status   Specimen Description BLOOD LEFT HAND  Final   Special Requests   Final    BOTTLES DRAWN AEROBIC AND ANAEROBIC Blood Culture adequate volume   Culture   Final    NO GROWTH 4 DAYS Performed at [facility], [address] [place], [place] [address]    Report Status PENDING  Incomplete  Urine Culture     Status: None   Collection Time: 05/14/20  2:33 PM   Specimen: Urine, Random  Result Value Ref Range Status   Specimen Description   Final    URINE, RANDOM Performed at [facility], [address] [place], [place] [address]    Special Requests   Final    NONE Performed at [facility], [address],  [place], [place] [address]    Culture   Final    NO GROWTH Performed at [facility], [place] [address], [place], [place] [address]    Report Status 05/15/2020 FINAL  Final          Ventilator continued, requirement due to severe respiratory failure   Ventilator Sedation RASS 0 to -2      ASSESSMENT AND PLAN SYNOPSIS  Acute hypoxemic respiratory failure due to COVID-19 pneumonia / ARDS Plan for SAT/SBT today  Severe ACUTE Hypoxic and Hypercapnic Respiratory Failure -continue Full MV support -continue Bronchodilator Therapy -Wean Fio2 and PEEP as tolerated -will perform SAT/SBT when respiratory parameters are met -VAP/VENT bundle implementation  Morbid obesity, possible OSA.   Will certainly impact respiratory mechanics, ventilator weaning Suspect will need to consider additional PEEP  ACUTE KIDNEY INJURY/Renal Failure -continue Foley Catheter-assess need -Avoid nephrotoxic agents -Follow urine output, BMP -Ensure adequate renal perfusion, optimize oxygenation -Renal dose medications     NEUROLOGY - intubated and sedated - minimal sedation to achieve a RASS goal: -1 Wake up assessment pending   CARDIAC ICU monitoring  ID -continue IV abx as prescibed -follow up cultures  GI GI PROPHYLAXIS as indicated   DIET-->on hold Constipation protocol as indicated  ENDO - will use ICU hypoglycemic\Hyperglycemia protocol if indicated     ELECTROLYTES -follow labs as needed -replace as needed -pharmacy consultation and following   DVT/GI PRX ordered and assessed TRANSFUSIONS AS NEEDED MONITOR FSBS I Assessed the need for Labs I Assessed the need for Foley I Assessed the need for Central Venous Line Family Discussion when available I Assessed the need for Mobilization I made an Assessment of medications to be adjusted accordingly Safety Risk assessment completed   CASE DISCUSSED IN MULTIDISCIPLINARY ROUNDS WITH ICU TEAM  Critical Care Time  devoted to patient care services described in this note is 34 minutes.   Overall, patient is critically ill, prognosis is guarded.    Corrin Parker, M.D.  [facility]  Medical Director [place] Director [facility] Cardio-Pulmonary Department

## 2020-05-21 DIAGNOSIS — J9601 Acute respiratory failure with hypoxia: Secondary | ICD-10-CM | POA: Diagnosis not present

## 2020-05-21 DIAGNOSIS — U071 COVID-19: Secondary | ICD-10-CM | POA: Diagnosis not present

## 2020-05-21 DIAGNOSIS — J1282 Pneumonia due to coronavirus disease 2019: Secondary | ICD-10-CM | POA: Diagnosis not present

## 2020-05-21 LAB — CBC WITH DIFFERENTIAL/PLATELET
Abs Immature Granulocytes: 0.07 10*3/uL (ref 0.00–0.07)
Basophils Absolute: 0 10*3/uL (ref 0.0–0.1)
Basophils Relative: 0 %
Eosinophils Absolute: 0.1 10*3/uL (ref 0.0–0.5)
Eosinophils Relative: 1 %
HCT: 29.9 % — ABNORMAL LOW (ref 39.0–52.0)
Hemoglobin: 9.1 g/dL — ABNORMAL LOW (ref 13.0–17.0)
Immature Granulocytes: 1 %
Lymphocytes Relative: 12 %
Lymphs Abs: 1 10*3/uL (ref 0.7–4.0)
MCH: 27.8 pg (ref 26.0–34.0)
MCHC: 30.4 g/dL (ref 30.0–36.0)
MCV: 91.4 fL (ref 80.0–100.0)
Monocytes Absolute: 0.5 10*3/uL (ref 0.1–1.0)
Monocytes Relative: 6 %
Neutro Abs: 7.1 10*3/uL (ref 1.7–7.7)
Neutrophils Relative %: 80 %
Platelets: 206 10*3/uL (ref 150–400)
RBC: 3.27 MIL/uL — ABNORMAL LOW (ref 4.22–5.81)
RDW: 17.7 % — ABNORMAL HIGH (ref 11.5–15.5)
WBC: 8.8 10*3/uL (ref 4.0–10.5)
nRBC: 0 % (ref 0.0–0.2)

## 2020-05-21 LAB — GLUCOSE, CAPILLARY
Glucose-Capillary: 107 mg/dL — ABNORMAL HIGH (ref 70–99)
Glucose-Capillary: 126 mg/dL — ABNORMAL HIGH (ref 70–99)
Glucose-Capillary: 132 mg/dL — ABNORMAL HIGH (ref 70–99)
Glucose-Capillary: 155 mg/dL — ABNORMAL HIGH (ref 70–99)
Glucose-Capillary: 85 mg/dL (ref 70–99)
Glucose-Capillary: 95 mg/dL (ref 70–99)

## 2020-05-21 LAB — MAGNESIUM: Magnesium: 2.5 mg/dL — ABNORMAL HIGH (ref 1.7–2.4)

## 2020-05-21 LAB — CULTURE, BLOOD (ROUTINE X 2)
Culture: NO GROWTH
Culture: NO GROWTH
Special Requests: ADEQUATE
Special Requests: ADEQUATE

## 2020-05-21 LAB — BASIC METABOLIC PANEL
Anion gap: 13 (ref 5–15)
BUN: 52 mg/dL — ABNORMAL HIGH (ref 6–20)
CO2: 32 mmol/L (ref 22–32)
Calcium: 8.8 mg/dL — ABNORMAL LOW (ref 8.9–10.3)
Chloride: 108 mmol/L (ref 98–111)
Creatinine, Ser: 0.81 mg/dL (ref 0.61–1.24)
GFR, Estimated: >60 mL/min (ref >60)
Glucose, Bld: 93 mg/dL (ref 70–99)
Potassium: 3.4 mmol/L — ABNORMAL LOW (ref 3.5–5.1)
Sodium: 153 mmol/L — ABNORMAL HIGH (ref 135–145)

## 2020-05-21 LAB — PHOSPHORUS: Phosphorus: 4.3 mg/dL (ref 2.5–4.6)

## 2020-05-21 MED ORDER — DEXTROSE 5 % IV SOLN: INTRAVENOUS | Status: DC

## 2020-05-21 MED ORDER — METOPROLOL TARTRATE 5 MG/5ML IV SOLN
2.5000 mg | INTRAVENOUS | Status: DC | PRN
  Administered 2020-05-21: 2.5 mg via INTRAVENOUS
  Filled 2020-05-21: qty 5

## 2020-05-21 MED ORDER — POTASSIUM CHLORIDE 10 MEQ/100ML IV SOLN
10.0000 meq | INTRAVENOUS | Status: AC
  Administered 2020-05-21 (×4): 10 meq via INTRAVENOUS
  Filled 2020-05-21 (×4): qty 100

## 2020-05-21 MED ORDER — INSULIN DETEMIR 100 UNIT/ML ~~LOC~~ SOLN
30.0000 [IU] | Freq: Two times a day (BID) | SUBCUTANEOUS | Status: DC
  Filled 2020-05-21 (×2): qty 0.3

## 2020-05-21 MED ORDER — INSULIN DETEMIR 100 UNIT/ML ~~LOC~~ SOLN
40.0000 [IU] | Freq: Two times a day (BID) | SUBCUTANEOUS | Status: DC
  Administered 2020-05-21 – 2020-05-24 (×7): 40 [IU] via SUBCUTANEOUS
  Filled 2020-05-21 (×9): qty 0.4

## 2020-05-21 NOTE — Plan of Care (Signed)

## 2020-05-21 NOTE — Progress Notes (Signed)
PHARMACY CONSULT NOTE  Pharmacy Consult for Electrolyte Monitoring and Replacement   Recent Labs: Potassium (mmol/L)  Date Value  05/21/2020 3.4 (L)   Magnesium (mg/dL)  Date Value  06/27/3566 2.5 (H)   Calcium (mg/dL)  Date Value  01/41/0301 8.8 (L)   Albumin (g/dL)  Date Value  31/43/8887 2.8 (L)   Phosphorus (mg/dL)  Date Value  57/97/2820 4.3   Sodium (mmol/L)  Date Value  05/21/2020 153 (H)   Corrected Calcium: 9.76 mg/dL   Assessment: Pharmacy has been consulted for electrolyte replacement in this 34 YOM requiring intubation and admission to the CCU for COVID PNA. Treatment including IV decadron, leading to the need for high amounts of insulin and eventually an insulin drip which has had to be restarted following several failed attempts at transitioning to subcutaneous insulin. He has been transitioned back to subcutaneous insulin.   Lasix 80mg  IV BID  MIVF: 5% dextrose at 50 mL/hr   Goal of Therapy:  Electrolytes WNL  Plan:   10 mEq IV KCl  x 4 per , NP  Follow-up electrolytes in am  Murvin Natal, PharmD 05/21/2020 6:59 AM

## 2020-05-21 NOTE — Progress Notes (Addendum)
Nutrition Follow-up  DOCUMENTATION CODES:   Morbid obesity  INTERVENTION:   RD will add supplements pending SLP evaluation   NUTRITION DIAGNOSIS:   Inadequate oral intake related to inability to eat as evidenced by NPO status. - pt extubated and pending SLP evaluation   GOAL:   Patient will meet greater than or equal to 90% of their needs - previously met with TF regimen.  MONITOR:   Diet advancement, Labs, Weight trends, Skin, I & O's  ASSESSMENT:   34 year old male with PMHx of OSA admitted with COVID-19 PNA.   Pt extubated 10/10. SLP evaluation pending. RD will add supplements pending SLP evaluation. Hyperglycemia improved. Per chart, pt is fairly weight stable.   Medications reviewed and include: vitamin C, aspirin, dexamethasone, lovenox, lasix, insulin, MVI, miralax, zinc, unasyn, precedex, Dextrose 5% @ 8m/hr, pepcid  Labs reviewed: Na 153(H), K 3.4(L), BUN 52(H), P 4.3 wnl, Mg 2.5(H) Hgb 9.1(L), Hct 29.9(L) cbgs- 85, 95 x 24 hrs  Diet Order:   Diet Order            DIET DYS 3 Room service appropriate? Yes with Assist; Fluid consistency: Nectar Thick  Diet effective now                EDUCATION NEEDS:   No education needs have been identified at this time  Skin:  Skin Assessment:  (Stage II buttocks, MASD, PI toes)  Last BM:  10/10- type 6  Height:   Ht Readings from Last 1 Encounters:  05/12/20 5' 10" (1.778 m)   Weight:   Wt Readings from Last 1 Encounters:  05/25/20 (!) 171.2 kg   Ideal Body Weight:  75.5 kg  BMI:  Body mass index is 54.16 kg/m.  Estimated Nutritional Needs:   Kcal:  3200-3500kcal/day  Protein:  >160g/day  Fluid:  2.3-2.6L/day  CKoleen DistanceMS, RD, LDN Please refer to [facility] RD and/or RD on-call/weekend/after hours pager

## 2020-05-21 NOTE — Evaluation (Addendum)
Physical Therapy Evaluation Patient Details Name: Collin Brown MRN: [id number] DOB: 02-28-1986 Today's Date: 05/21/2020   History of Present Illness  Eliud Polo is a34yoM who comes [facility] on 9/23 after progression of dyspnea. Pt recently (+)COVID19 along with other family. Pt required intubation on 9/25, self extubated on 10/10. At evaluation pt presenting on HHHF @55L /min, 90%FiO2.  Clinical Impression  Pt admitted with above diagnosis. Pt currently with functional limitations due to the deficits listed below (see "PT Problem List"). Background information is limited, due to patient's communication deficits and inability to contact family. Upon entry, pt in bed, awake and agreeable to participate. Pt is still having difficulty with speech generation and vocalization s/p prolonged intubation, but responds to yes/no questions with mild delay. Pt requires TotalA+2-3 for all basic mobility in bed, has severe weakness in 4 limbs, which despite trace activation or better, are functionally flaccid during bed mobility. In BUE, Left is largely trace level strength except for shoulder elevation which is partial with attempted reach. RUE is grossly 2/5. In BLE, legs are grossly 1-2/5, again Rt side stronger than left, and ankles/toes limited by non-pitting edema. 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 Functional mobility assessment demonstrates increased effort/time requirements, fair tolerance, and absolute need for heavy physical assistance and total care, whereas the patient performed these at a higher level of independence PTA. Pt will benefit from skilled PT intervention to increase independence and safety with basic mobility in preparation for discharge to the venue listed below.       Follow Up Recommendations Other (comment) (WIll defer until prognosis is more clear. At present would need total care and oxygen support.)    Equipment Recommendations       Recommendations for Other  Services       Precautions / Restrictions Precautions Precautions: Fall Precaution Comments: significant deconditioning and weakness, struggling c limb control Restrictions Weight Bearing Restrictions: No      Mobility  Bed Mobility Overal bed mobility: Needs Assistance Bed Mobility: Rolling (and scooting) Rolling: +2 for safety/equipment;Total assist         General bed mobility comments: author protects Left arm during mobility  Transfers                 General transfer comment: unsafe to attempt at this time  Ambulation/Gait                Stairs            Wheelchair Mobility    Modified Rankin (Stroke Patients Only)       Balance                                             Pertinent Vitals/Pain Pain Assessment: No/denies pain    Home Living Family/patient expects to be discharged to:: Private residence Living Arrangements: Parent;Other (Comment) (Mother, and 19yo Nephew) Available Help at Discharge: Family Type of Home: House       Home Layout: One level Home Equipment: None Additional Comments: Pt unable to verbalize, therapist unable to read lips, unable to hold pen to write; mom's phone goes straight to voicemail; will verify as able    Prior Function Level of Independence: Independent         Comments: 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     Hand Dominance   Dominant Hand: Right    Extremity/Trunk Assessment   Upper Extremity Assessment Upper Extremity Assessment:  (see note for detail)    Lower Extremity Assessment Lower Extremity Assessment:  (see note for detail)     *Left hallux blister    *right hallux discoloration     05/21/20 0001  AROM  AROM Assessment Site Cervical  Cervical - Right Rotation 40  Cervical - Left Rotation 30     05/21/20 0001  Strength  Strength Assessment Site  Shoulder;Elbow;Forearm;Hand;Wrist;Hip;Knee;Ankle;Cervical  Right/Left Shoulder Right;Left  Right/Left Elbow Right;Left  Right/Left Forearm Right;Left  Right/Left Wrist Right;Left  Right/Left hand Right;Left  Right/Left Hip Right;Left  Right/Left Knee Right;Left  Right/Left Ankle Right;Left  Right Shoulder Flexion 2-/5  Right Shoulder ABduction 2-/5  Right Shoulder External Rotation 1/5  Left Shoulder Flexion 1/5  Left Shoulder ABduction 1/5  Left Shoulder External Rotation 0/5  Right Elbow Flexion 3-/5  Right Elbow Extension 3/5  Left Elbow Flexion 0/5  Left Elbow Extension 0/5  Right Forearm Pronation 0/5  Right Forearm Supination 0/5  Left Forearm Pronation 0/5  Left Forearm Supination 0/5  Right Wrist Flexion 2-/5  Right Wrist Extension 2-/5  Left Wrist Flexion 0/5  Left Wrist Extension 0/5  Right Hand Gross Grasp Impaired  Left Hand Gross Grasp Impaired  Right Hip Flexion 2/5  Right Hip Extension 2+/5  Right Hip ABduction 0/5  Right Hip ADduction 1/5  Left Hip Flexion 1/5  Left Hip Extension 2/5  Left Hip ABduction 1/5  Left Hip ADduction 2-/5  Left Knee Extension 2+/5  Right Ankle Dorsiflexion 1/5 (limited by turgid edema)  Right Ankle Plantar Flexion 1/5  Left Ankle Dorsiflexion 2-/5  Left Ankle Plantar Flexion 2-/5  Cervical Extension 2+/5 (retraction )         Communication   Communication: HOH;Other (comment) (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, self extubated 10/10, difficulty speaking)  Cognition Arousal/Alertness: Awake/alert Behavior During Therapy: Flat affect Overall Cognitive Status: Impaired/Different from baseline Area of Impairment: Attention;Following commands;Problem solving                   Current Attention Level: Focused   Following Commands: Follows one step commands consistently;Follows one step commands with increased time     Problem Solving: Slow processing;Decreased initiation General Comments: extubated on 10/10, doesn't have his  voice back yet, too weak to use written communication, does fairly well with nodding head yes/no to simple questions, appears to be unsure why he is here; able to follow simple commands with cues      General Comments General comments (skin integrity, edema, etc.): RT changed FiO2 from 100% to 90% at start of session; 55L via HFNC. VSS throughout    Exercises Low Level/ICU Exercises Ankle Circles/Pumps: AROM;Both;10 reps Short Arc QuadBarbaraann Boys;Both;5 reps Hip Extension: Strengthening;Both;10 reps Hip ABduction/ADduction: AAROM;Both;5 reps Heel Slides: AAROM;Both;5 reps Other Exercises Other Exercises: RUE ther-ex requiring Max manual cues and noted max 2-/5 elbow flexion strength in GE position Other Exercises: Repositioning of RLE and LUE to improve joint alignment, skin integrity, and minimize risk of injury; notified RN   Assessment/Plan    PT Assessment Patient needs continued PT services  PT Problem List Decreased strength;Decreased range of motion;Decreased activity tolerance;Decreased mobility;Cardiopulmonary status limiting activity;Decreased knowledge of use of DME;Decreased cognition;Obesity;Decreased balance;Decreased coordination;Decreased knowledge of precautions       PT Treatment Interventions DME instruction;Balance training;Modalities;Gait training;Neuromuscular re-education;Stair training;Functional mobility training;Patient/family education;Cognitive remediation;Therapeutic  activities;Wheelchair mobility training;Therapeutic exercise;Manual techniques    PT Goals (Current goals can be found in the Care Plan section)  Acute Rehab PT Goals Patient Stated Goal: pt unable to verbalize PT Goal Formulation: Patient unable to participate in goal setting Time For Goal Achievement: 06/18/20 Potential to Achieve Goals: Poor    Frequency Min 2X/week   Barriers to discharge Inaccessible home environment;Decreased caregiver support      Co-evaluation                AM-PAC PT "6 Clicks" Mobility  Outcome Measure Help needed turning from your back to your side while in a flat bed without using bedrails?: Total Help needed moving from lying on your back to sitting on the side of a flat bed without using bedrails?: Total Help needed moving to and from a bed to a chair (including a wheelchair)?: Total Help needed standing up from a chair using your arms (e.g., wheelchair or bedside chair)?: Total Help needed to walk in hospital room?: Total Help needed climbing 3-5 steps with a railing? : Total 6 Click Score: 6    End of Session Equipment Utilized During Treatment: Oxygen Activity Tolerance: Patient tolerated treatment well;Patient limited by fatigue Patient left: in bed Nurse Communication: Mobility status (support of head, neck positioning for airway opening) PT Visit Diagnosis: Difficulty in walking, not elsewhere classified (R26.2);Other abnormalities of gait and mobility (R26.89);Muscle weakness (generalized) (M62.81)    Time: 1610-9604 PT Time Calculation (min) (ACUTE ONLY): 45 min   Charges:   PT Evaluation $PT Eval High Complexity: 1 High PT Treatments $Therapeutic Exercise: 23-37 mins        4:51 PM, 05/21/20 Rosamaria Lints, PT, DPT Physical Therapist - [facility]  [phone] (ASCOM)    Aeron Lheureux C 05/21/2020, 4:51 PM

## 2020-05-21 NOTE — Progress Notes (Signed)
Inpatient Diabetes Program Recommendations  AACE/ADA: New Consensus Statement on Inpatient Glycemic Control (2015)  Target Ranges:  Prepandial:   less than 140 mg/dL      Peak postprandial:   less than 180 mg/dL (1-2 hours)      Critically ill patients:  140 - 180 mg/dL   Lab Results  Component Value Date   GLUCAP 95 05/21/2020   HGBA1C 7.2 (H) 05/03/2020    Review of Glycemic Control Results for EILEEN, CROSWELL (MRN [id number]) as of 05/21/2020 10:47  Ref. Range 05/20/2020 11:07 05/20/2020 15:46 05/20/2020 19:41 05/21/2020 03:49 05/21/2020 07:17  Glucose-Capillary Latest Ref Range: 70 - 99 mg/dL 729 (H) 021 (H) 115 (H) 85 95    Inpatient Diabetes Program Recommendations:   Spoke with pharmacist Burnis Medin regarding adjustment in current Levemir dosing. Consider decrease in Levemir to 40 units bid and readjust as needed.  Thank you, Billy Fischer. Martinez Boxx, RN, MSN, CDE  Diabetes Coordinator Inpatient Glycemic Control Team Team Pager [phone] (8am-5pm) 05/21/2020 10:48 AM

## 2020-05-21 NOTE — Progress Notes (Signed)
CRITICAL CARE NOTE  34 yo with OSA, morbid obesity BMI>56 came in with flu like illness diagnosed with COVID19 pneumonia was initially ion Medical floor with hospitalist service initiated Remdesevir and barictitinib, he progressively became more dyspneic he was placed on BIPAP and did not tolerate then placed on HFNC did not tolerate patient himself consented to MV and was intubated in MICU. Mother was notified who is sick at home with COVID.   Events:  9/27- patient on 65%FiO2 with proning protocol. CVP trending, goal negative fluid balance.  05/08/20- patient with worsening ventilation increased FiO2 to 100% overnight and had episode of severe bradycardia. Concern for mucus plugging will perform recruitment maneuvers and possible bronchoscopy. We were able to perform tracheal aspirate and have weaned down fIO2 to 75% 05/09/20- patient had bradycardic episode and he desaturated overnight with temporary increase to 100FiO2. Lasix q12 going. I spoke to brother today for update. His tracheal aspirate came back normal flora so we will stop antibiotic.  05/10/20-patient with pneumomediastinum and on maximal setting on ventilator. Poor prognosis very unfortunate young man. I called and updated Mother today [name].  05/11/20- patient weaned to 80%. Will continue with weaning and proning protocol  05/12/20-patient remains critically ill. Mother at bedside we discussed his care plan, she is with strong faith and continues to pray for her son to improve.  05/13/20- patient remains crtically ill. Proning per protocol. No significant events overnight.  05/14/20- Continues to be febrile, FiO2 up to 100%, improved down to 60% after increasing PEEP from 10 to 16.  05/15/20- patient remains crtically ill, overall status unchanged 05/16/20-persistent fevers, CT head and chest, does have sinus opacification. 10/7 severe ARDS 10/8 febrile, severe ARDS 10/9 severe ARDS 10/10 self extubated, placed on  biPAP 10/11 on biPAP tolerating well, precedex      CC  follow up respiratory failure COVID 19 pneumonia  SUBJECTIVE  critically ill Prognosis is guarded Wean off biPAP High risk for intubation   BP (!) 100/54   Pulse 77   Temp (!) 100.4 F (38 C) (Axillary)   Resp 18   Ht 5\' 10"  (1.778 m)   Wt (!) 168.8 kg   SpO2 95%   BMI 53.40 kg/m    I/O last 3 completed shifts: In: 2863.4 [I.V.:2107; IV Piggyback:756.4] Out: 7000 [Urine:7000] No intake/output data recorded.  SpO2: 95 % O2 Flow Rate (L/min): 50 L/min FiO2 (%): (S) 50 %  Estimated body mass index is 53.4 kg/m as calculated from the following:   Height as of this encounter: 5\' 10"  (1.778 m).   Weight as of this encounter: 168.8 kg.  SIGNIFICANT EVENTS   REVIEW OF SYSTEMS  PATIENT IS UNABLE TO PROVIDE COMPLETE REVIEW OF SYSTEMS DUE TO resp failure   Pressure Injury 05/12/20 Toe (Comment  which one) Anterior;Right Stage 2 -  Partial thickness loss of dermis presenting as a shallow open injury with a red, pink wound bed without slough. (Active)  05/12/20 1930  Location: Toe (Comment  which one)  Location Orientation: Anterior;Right  Staging: Stage 2 -  Partial thickness loss of dermis presenting as a shallow open injury with a red, pink wound bed without slough.  Wound Description (Comments):   Present on Admission: No     Pressure Injury 05/12/20 Toe (Comment  which one) Anterior;Left Stage 2 -  Partial thickness loss of dermis presenting as a shallow open injury with a red, pink wound bed without slough. (Active)  05/12/20 1930  Location: Toe (Comment  which one)  Location Orientation: Anterior;Left  Staging: Stage 2 -  Partial thickness loss of dermis presenting as a shallow open injury with a red, pink wound bed without slough.  Wound Description (Comments):   Present on Admission: No    COVID-19 DISASTER DECLARATION:   FULL CONTACT PHYSICAL EXAMINATION WAS NOT POSSIBLE DUE TO TREATMENT OF  COVID-19  AND CONSERVATION OF PERSONAL PROTECTIVE EQUIPMENT, LIMITED EXAM FINDINGS INCLUDE-   PHYSICAL EXAMINATION:  GENERAL:critically ill appearing, +resp distress NEUROLOGIC: obtunded, GCS<8   Patient assessed or the symptoms described in the history of present illness.  In the context of the Global COVID-19 pandemic, which necessitated consideration that the patient might be at risk for infection with the SARS-CoV-2 virus that causes COVID-19, Institutional protocols and algorithms that pertain to the evaluation of patients at risk for COVID-19 are in a state of rapid change based on information released by regulatory bodies including the CDC and federal and state organizations. These policies and algorithms were followed during the patient's care while in hospital.    MEDICATIONS: I have reviewed all medications and confirmed regimen as documented   CULTURE RESULTS   Recent Results (from the past 240 hour(s))  Culture, respiratory (non-expectorated)     Status: None   Collection Time: 05/14/20  8:58 AM   Specimen: Tracheal Aspirate; Respiratory  Result Value Ref Range Status   Specimen Description   Final    TRACHEAL ASPIRATE Performed at [facility], [address], [place], [place] [address]    Special Requests   Final    NONE Performed at [facility], [address] [place], [place] [address]    Gram Stain   Final    RARE WBC PRESENT,BOTH PMN AND MONONUCLEAR NO ORGANISMS SEEN    Culture   Final    RARE Normal respiratory flora-no Staph aureus or Pseudomonas seen Performed at [facility], [address] [place], [place] [address]    Report Status 05/16/2020 FINAL  Final  CULTURE, BLOOD (ROUTINE X 2) w Reflex to ID Panel     Status: None (Preliminary result)   Collection Time: 05/14/20  9:33 AM   Specimen: BLOOD LEFT HAND  Result Value Ref Range Status   Specimen Description BLOOD LEFT HAND  Final   Special Requests   Final     BOTTLES DRAWN AEROBIC AND ANAEROBIC Blood Culture adequate volume   Culture   Final    NO GROWTH 4 DAYS Performed at [facility], [address] [place], [place] [address]    Report Status PENDING  Incomplete  CULTURE, BLOOD (ROUTINE X 2) w Reflex to ID Panel     Status: None (Preliminary result)   Collection Time: 05/14/20 10:17 AM   Specimen: BLOOD LEFT HAND  Result Value Ref Range Status   Specimen Description BLOOD LEFT HAND  Final   Special Requests   Final    BOTTLES DRAWN AEROBIC AND ANAEROBIC Blood Culture adequate volume   Culture   Final    NO GROWTH 4 DAYS Performed at [facility], [address] [place], [place] [address]    Report Status PENDING  Incomplete  Urine Culture     Status: None   Collection Time: 05/14/20  2:33 PM   Specimen: Urine, Random  Result Value Ref Range Status   Specimen Description   Final    URINE, RANDOM Performed at [facility], [address] [place], [place] [address]    Special  Requests   Final    NONE Performed at [facility], [address] [place], [place] [address]    Culture   Final    NO GROWTH Performed at [facility], [address], [place], [place] [address]    Report Status 05/15/2020 FINAL  Final          IMAGING    No results found.   Nutrition Status: Nutrition Problem: Inadequate oral intake Etiology: inability to eat Signs/Symptoms: NPO status Interventions: Tube feeding, Prostat, MVI     Vent Mode: PRVC FiO2 (%):  [40 %-50 %] 50 % Set Rate:  [20 bmp] 20 bmp Vt Set:  [460 mL] 460 mL PEEP:  [5 cmH20] 5 cmH20 Pressure Support:  [5 cmH20] 5 cmH20 Plateau Pressure:  [26 cmH20] 26 cmH20  CBC    Component Value Date/Time   WBC 8.8 05/21/2020 0303   RBC 3.27 (L) 05/21/2020 0303   HGB 9.1 (L) 05/21/2020 0303   HCT 29.9 (L) 05/21/2020 0303   PLT 206 05/21/2020 0303   MCV 91.4 05/21/2020 0303   MCH 27.8 05/21/2020 0303   MCHC 30.4 05/21/2020  0303   RDW 17.7 (H) 05/21/2020 0303   LYMPHSABS 1.0 05/21/2020 0303   MONOABS 0.5 05/21/2020 0303   EOSABS 0.1 05/21/2020 0303   BASOSABS 0.0 05/21/2020 0303   BMP Latest Ref Rng & Units 05/21/2020 05/20/2020 05/19/2020  Glucose 70 - 99 mg/dL 93 371(G) 626(R)  BUN 6 - 20 mg/dL 48(N) 46(E) 70(J)  Creatinine 0.61 - 1.24 mg/dL 5.00 9.38 1.82  Sodium 135 - 145 mmol/L 153(H) 148(H) 141  Potassium 3.5 - 5.1 mmol/L 3.4(L) 3.4(L) 3.2(L)  Chloride 98 - 111 mmol/L 108 104 100  CO2 22 - 32 mmol/L 32 34(H) 34(H)  Calcium 8.9 - 10.3 mg/dL 9.9(B) 7.1(I) 9.6(V)       ASSESSMENT AND PLAN SYNOPSIS Acute hypoxemic respiratory failure due to COVID-19 pneumonia / ARDS Wean off biPAP Oxygen as needed High risk for intubation Diamox/lasix as tolerated   Morbid obesity, possible OSA.   Will certainly impact respiratory mechanics     NEUROLOGY precedex as needed for agitation   CARDIAC ICU monitoring  ID -continue IV abx as prescibed -follow up cultures  GI GI PROPHYLAXIS as indicated   DIET-->NPO Constipation protocol as indicated  ENDO - will use ICU hypoglycemic\Hyperglycemia protocol if indicated     ELECTROLYTES -follow labs as needed -replace as needed -pharmacy consultation and following   DVT/GI PRX ordered and assessed TRANSFUSIONS AS NEEDED MONITOR FSBS I Assessed the need for Labs I Assessed the need for Foley I Assessed the need for Central Venous Line Family Discussion when available I Assessed the need for Mobilization I made an Assessment of medications to be adjusted accordingly Safety Risk assessment completed   CASE DISCUSSED IN MULTIDISCIPLINARY ROUNDS WITH ICU TEAM  Critical Care Time devoted to patient care services described in this note is .   Overall, patient is critically ill, prognosis is guarded.     Lucie Leather, M.D.  [facility]  Medical Director [facility] Medical Director  [facility] Cardio-Pulmonary Department

## 2020-05-21 NOTE — Evaluation (Signed)
Occupational Therapy Evaluation Patient Details Name: Collin Brown MRN: [id number] DOB: 05-Oct-1985 Today's Date: 05/21/2020    History of Present Illness 34 year old never smoker, with morbid obesity, OSA, who presented to [facility] on 03 May 2020 with a complaint of increasing shortness of breath.  He was diagnosed with COVID-19 and was admitted for further management.  Initially required only 4 L/min to maintain oxygen saturations in the low 90s.  There are 2 family members with COVID-19 in the home.  He was started on Covid protocol and admitted for observation with the hopes of a quick discharge to home.  Over the course of his admission however he has had increasing oxygen requirements. Transferred to ICU and emergently intubated on 9/25. Self extubated on 10/10, placed on BiPAP. now on HFNC 55L at 90% FiO2 at time of evaluation.   Clinical Impression   Pt was seen for OT evaluation this date. Prior to hospital admission, per RN, pt was ambulating limited distances 2/2 morbid obesity at baseline. Unable to verify this along with PLOF/home set up 2/2 phone going straight to voicemail when therapist attempted to call pt's mother. Unclear where pt lives. Pt does nod to being independent and working at baseline. Again, unable to verify. Pt profoundly weak at time of evaluation. Edema noted. RLE had drifted off the R side of the bed, repositioned to minimize drifting to minimize injury risk. R foot noted to be very cold. RN aware. Denies sensory deficits. Alert throughout, pt unable to speak (self extubated previous date) and too weak to hold a pen when set up for written communication. Pt required Max assist for bed level grooming. Anticipate Max x1-2 for all other bed level ADL 2/2 impairments. VSS throughout limited session. RUE strengthening ex requiring max manual cues to perform. Pt attempted to communicate and ultimately determined that pt was stating his mouth was dry. Pt nodded when asked if  he wanted OT to speak with RN promptly about it. Pt declined additional there ex for LUE. Currently pt demonstrates significant impairments as described below (See OT problem list) which functionally limit his ability to perform ADL/self-care tasks. Pt would benefit from skilled OT services to address noted impairments and functional limitations (see below for any additional details) in order to maximize safety and independence while minimizing falls risk and caregiver burden. Upon hospital discharge, recommend STR to maximize pt safety and return to PLOF pending progress with therapist while hospitalized.     Follow Up Recommendations  SNF    Equipment Recommendations  Other (comment) (TBD at next venue o fcare)    Recommendations for Other Services       Precautions / Restrictions Precautions Precautions: Fall Precaution Comments: watch vitals Restrictions Weight Bearing Restrictions: No      Mobility Bed Mobility               General bed mobility comments: unsafe to attempt at this time  Transfers                 General transfer comment: unsafe to attempt at this time    Balance                                           ADL either performed or assessed with clinical judgement   ADL Overall ADL's : Needs assistance/impaired  General ADL Comments: Max A for bed level grooming; anticpate at least Max +2 for all bed level bathing, dressing, and toileting 2/2 current impairments.     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 VSS throughout    Exercises Other Exercises Other Exercises: RUE ther-ex requiring Max manual cues and noted max 2-/5 elbow flexion strength in GE position Other Exercises: Repositioning of RLE and LUE to improve joint alignment, skin integrity, and minimize risk of injury; notified RN   Shoulder Instructions      Home Living Family/patient expects to be discharged to:: Private residence                                 Additional Comments: Pt unable to verbalize, therapist unable to read lips, unable to hold pen to write; mom's phone goes straight to voicemail; will verify as able      Prior Functioning/Environment          Comments: unclear at this time, pt unable to verbalize, attempted to call mother and went straight to voicemail, pt nods head to being independent at baseline and nods when asked if he works; Charity fundraiser reports he was only ambulating a few feet at a time at basline, unable to confirm        OT Problem List: Decreased strength;Decreased cognition;Increased edema;Cardiopulmonary status limiting activity;Decreased coordination;Decreased activity tolerance;Obesity;Decreased knowledge of use of DME or AE;Impaired balance (sitting and/or standing);Impaired  UE functional use      OT Treatment/Interventions: Self-care/ADL training;Therapeutic exercise;Therapeutic activities;Cognitive remediation/compensation;DME and/or AE instruction;Patient/family education;Balance training    OT Goals(Current goals can be found in the care plan section) Acute Rehab OT Goals Patient Stated Goal: pt unable to verbalize OT Goal Formulation: Patient unable to participate in goal setting Time For Goal Achievement: 06/04/20 Potential to Achieve Goals: Fair  OT Frequency: Min 2X/week   Barriers to D/C:            Co-evaluation              AM-PAC OT "6 Clicks" Daily Activity     Outcome Measure Help from another person eating meals?: A Lot Help from another person taking care of personal grooming?: A Lot Help from another person toileting, which includes using toliet,  bedpan, or urinal?: Total Help from another person bathing (including washing, rinsing, drying)?: Total Help from another person to put on and taking off regular upper body clothing?: A Lot Help from another person to put on and taking off regular lower body clothing?: Total 6 Click Score: 9   End of Session Nurse Communication: Other (comment) (impairments, RLE positioning, given call bell, pt requesting something to minimize the mouth dryness)  Activity Tolerance: Patient tolerated treatment well Patient left: in bed;with call bell/phone within reach;with bed alarm set  OT Visit Diagnosis: Other abnormalities of gait and mobility (R26.89);Muscle weakness (generalized) (M62.81);Other symptoms and signs involving cognitive function                Time: 1346-1418 OT Time Calculation (min): 32 min Charges:  OT General Charges $OT Visit: 1 Visit OT Evaluation $OT Eval High Complexity: 1 High OT Treatments $Therapeutic Exercise: 8-22 mins  Richrd Prime, MPH, MS, OTR/L ascom [phone] 05/21/20, 2:48 PM

## 2020-05-21 NOTE — Progress Notes (Addendum)
Shift summary:  - Patient on BiPAP this AM, trial on HFNC this AM.  - Weaning Precedex as tolerated; off as of 0855 hrs.  - PT/OT/SLP consults ordered today.    - Left-sided flaccidity noted by RN and PT today. Unknown how long since last normal. MD notified. No plans for imaging at this time.

## 2020-05-22 ENCOUNTER — Inpatient Hospital Stay: Payer: BC Managed Care – PPO

## 2020-05-22 DIAGNOSIS — G4730 Sleep apnea, unspecified: Secondary | ICD-10-CM | POA: Diagnosis not present

## 2020-05-22 DIAGNOSIS — U071 COVID-19: Secondary | ICD-10-CM | POA: Diagnosis not present

## 2020-05-22 DIAGNOSIS — J9601 Acute respiratory failure with hypoxia: Secondary | ICD-10-CM | POA: Diagnosis not present

## 2020-05-22 LAB — CBC WITH DIFFERENTIAL/PLATELET
Abs Immature Granulocytes: 0.05 10*3/uL (ref 0.00–0.07)
Basophils Absolute: 0 10*3/uL (ref 0.0–0.1)
Basophils Relative: 0 %
Eosinophils Absolute: 0.1 10*3/uL (ref 0.0–0.5)
Eosinophils Relative: 1 %
HCT: 31.2 % — ABNORMAL LOW (ref 39.0–52.0)
Hemoglobin: 9.5 g/dL — ABNORMAL LOW (ref 13.0–17.0)
Immature Granulocytes: 1 %
Lymphocytes Relative: 11 %
Lymphs Abs: 0.9 10*3/uL (ref 0.7–4.0)
MCH: 28.2 pg (ref 26.0–34.0)
MCHC: 30.4 g/dL (ref 30.0–36.0)
MCV: 92.6 fL (ref 80.0–100.0)
Monocytes Absolute: 0.5 10*3/uL (ref 0.1–1.0)
Monocytes Relative: 6 %
Neutro Abs: 6.9 10*3/uL (ref 1.7–7.7)
Neutrophils Relative %: 81 %
Platelets: 220 10*3/uL (ref 150–400)
RBC: 3.37 MIL/uL — ABNORMAL LOW (ref 4.22–5.81)
RDW: 18.5 % — ABNORMAL HIGH (ref 11.5–15.5)
WBC: 8.5 10*3/uL (ref 4.0–10.5)
nRBC: 0 % (ref 0.0–0.2)

## 2020-05-22 LAB — BASIC METABOLIC PANEL
Anion gap: 9 (ref 5–15)
BUN: 45 mg/dL — ABNORMAL HIGH (ref 6–20)
CO2: 35 mmol/L — ABNORMAL HIGH (ref 22–32)
Calcium: 9 mg/dL (ref 8.9–10.3)
Chloride: 104 mmol/L (ref 98–111)
Creatinine, Ser: 0.69 mg/dL (ref 0.61–1.24)
GFR, Estimated: >60 mL/min (ref >60)
Glucose, Bld: 129 mg/dL — ABNORMAL HIGH (ref 70–99)
Potassium: 3.2 mmol/L — ABNORMAL LOW (ref 3.5–5.1)
Sodium: 148 mmol/L — ABNORMAL HIGH (ref 135–145)

## 2020-05-22 LAB — GLUCOSE, CAPILLARY
Glucose-Capillary: 116 mg/dL — ABNORMAL HIGH (ref 70–99)
Glucose-Capillary: 117 mg/dL — ABNORMAL HIGH (ref 70–99)
Glucose-Capillary: 120 mg/dL — ABNORMAL HIGH (ref 70–99)
Glucose-Capillary: 142 mg/dL — ABNORMAL HIGH (ref 70–99)
Glucose-Capillary: 195 mg/dL — ABNORMAL HIGH (ref 70–99)
Glucose-Capillary: 230 mg/dL — ABNORMAL HIGH (ref 70–99)

## 2020-05-22 LAB — MAGNESIUM: Magnesium: 2.4 mg/dL (ref 1.7–2.4)

## 2020-05-22 LAB — PHOSPHORUS: Phosphorus: 4.2 mg/dL (ref 2.5–4.6)

## 2020-05-22 IMAGING — CT CT HEAD W/O CM
3 of 4 series · 14 of 47 positions shown, 16 images · non-contrast
Comparison: Head CT [DATE].

CLINICAL DATA: 34-year-old male positive [X5] pneumonia.
Neurologic deficit.

EXAM:
CT HEAD WITHOUT CONTRAST
TECHNIQUE: Contiguous axial images were obtained from the base of the skull
through the vertex without intravenous contrast.

[Series 3: head wo · axial · 0.49mm/px · z∈[+537,+657]mm · 8 of 32 slices shown, 10 images]
[im 4/32  brain]
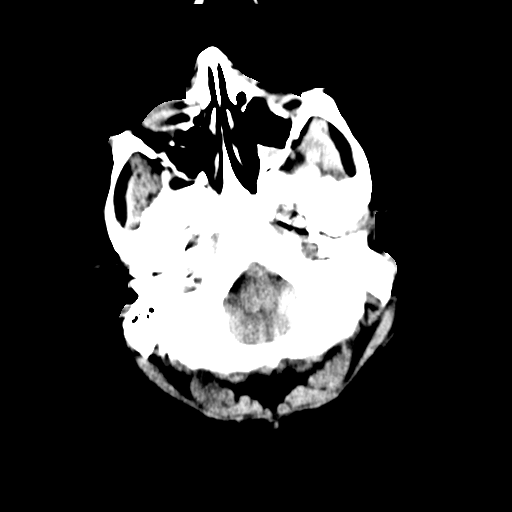
[im 4/32  bone]
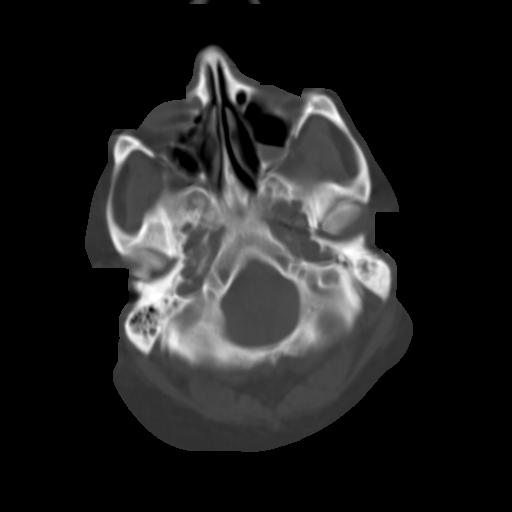
[im 7/32  brain]
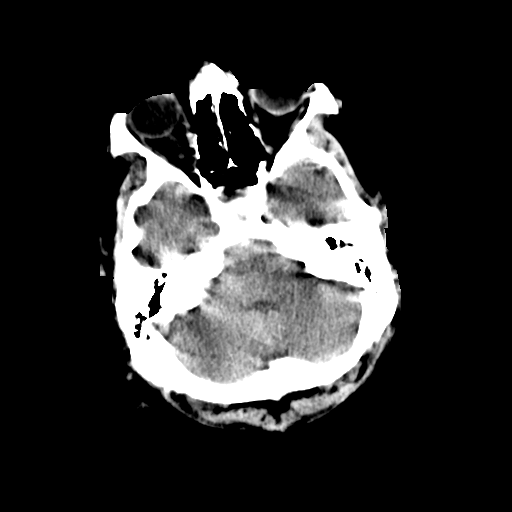
[im 10/32  brain]
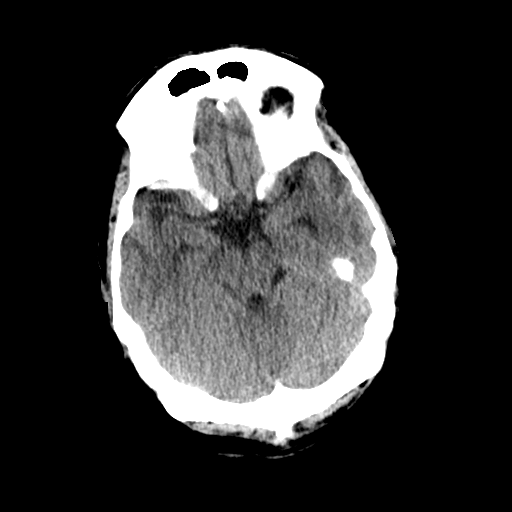
[im 14/32  brain]
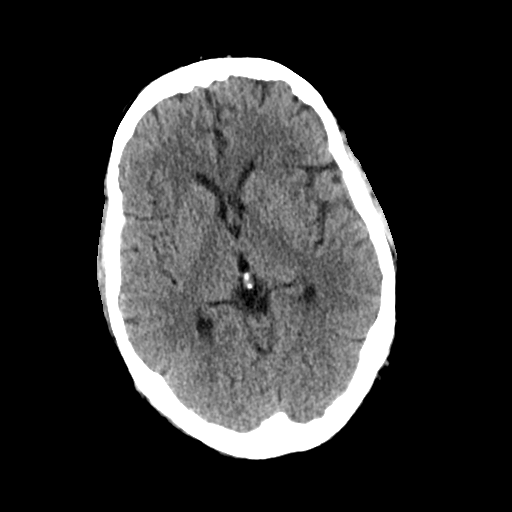
[im 18/32  brain]
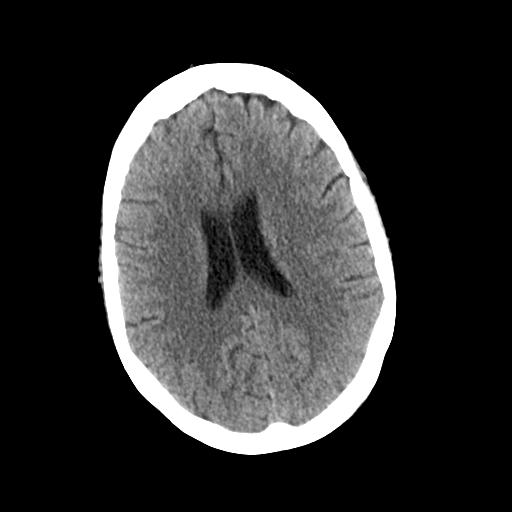
[im 18/32  bone]
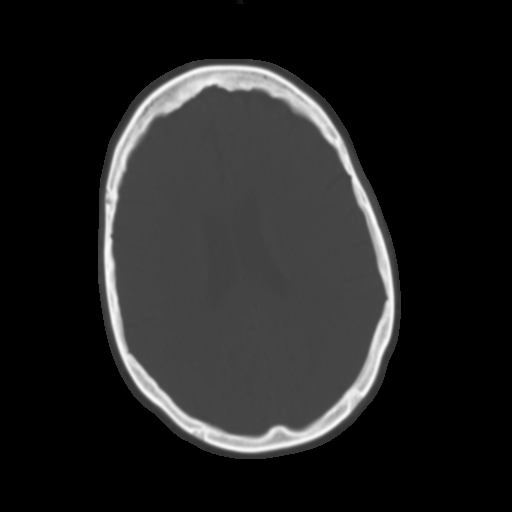
[im 22/32  brain]
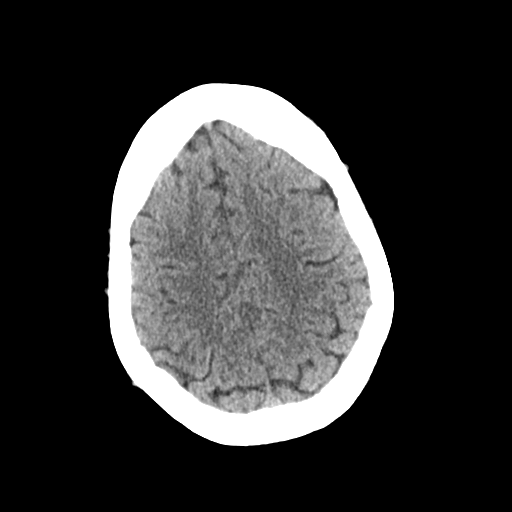
[im 25/32  brain]
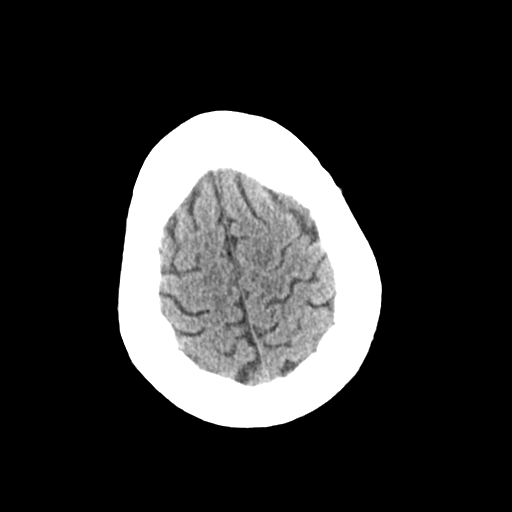
[im 28/32  brain]
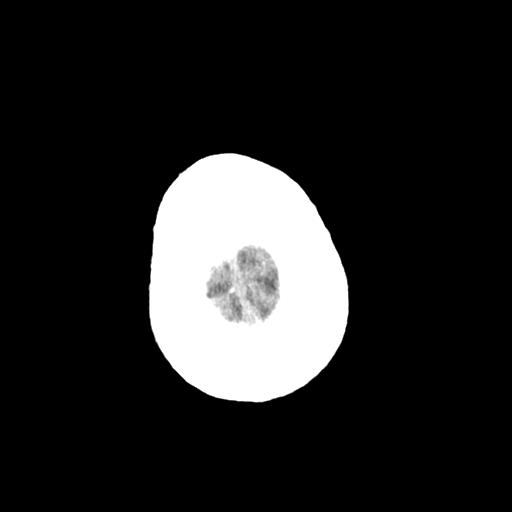

[Series 6: coronal soft tissue · coronal · 0.32mm/px · 3 of 70 slices shown]
[im 24/70  brain]
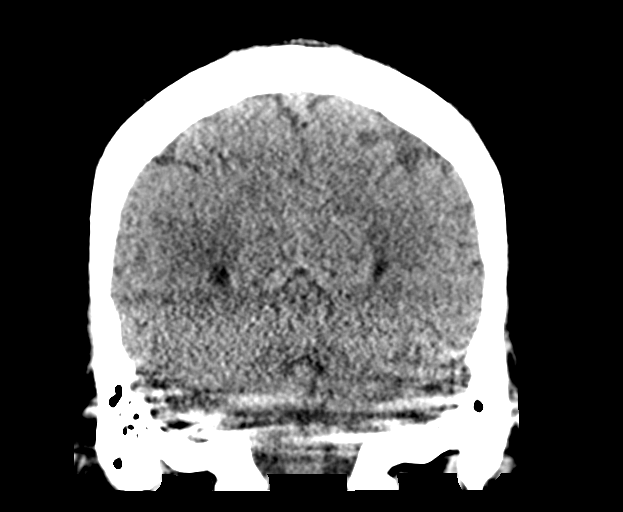
[im 31/70  brain]
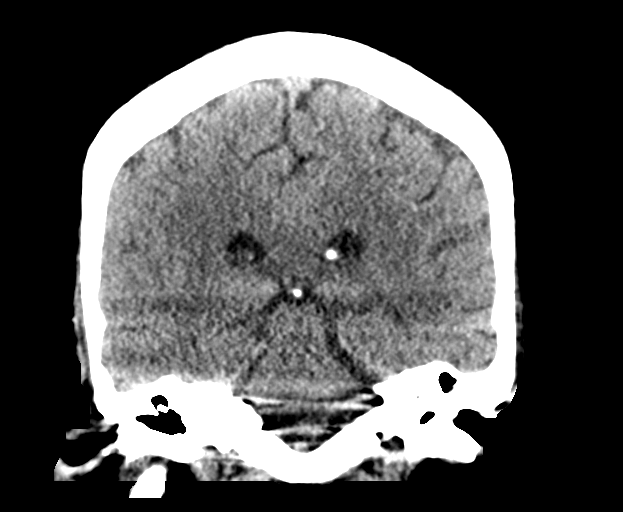
[im 39/70  brain]
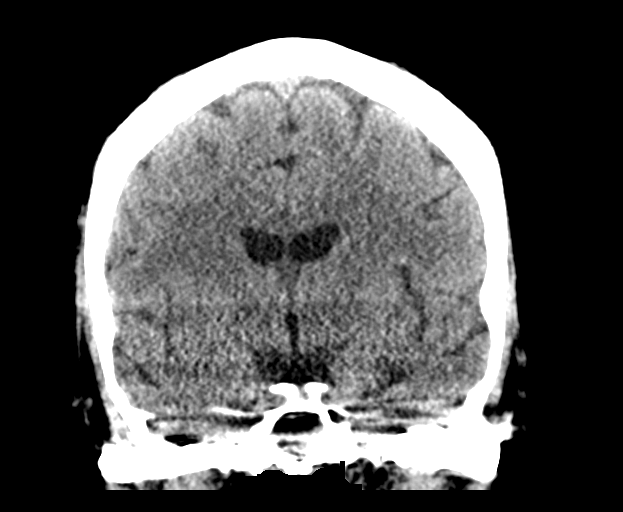

[Series 7: sagittal soft tissue · sagittal · 0.33mm/px · 3 of 53 slices shown]
[im 18/53  brain]
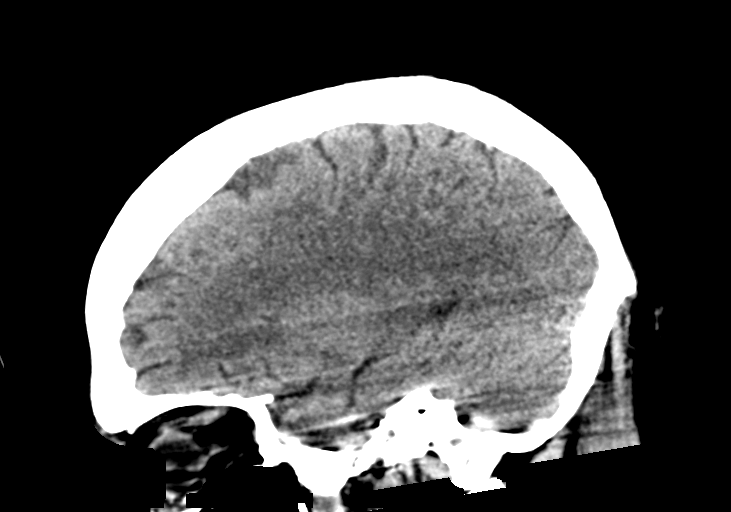
[im 27/53  brain]
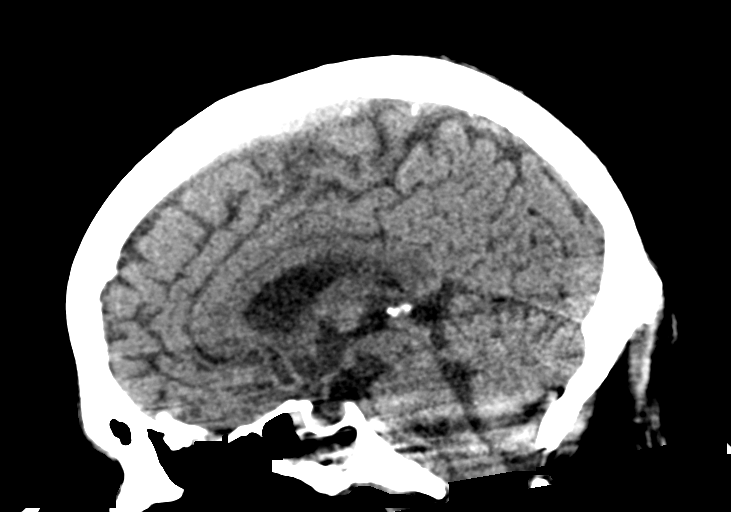
[im 35/53  brain]
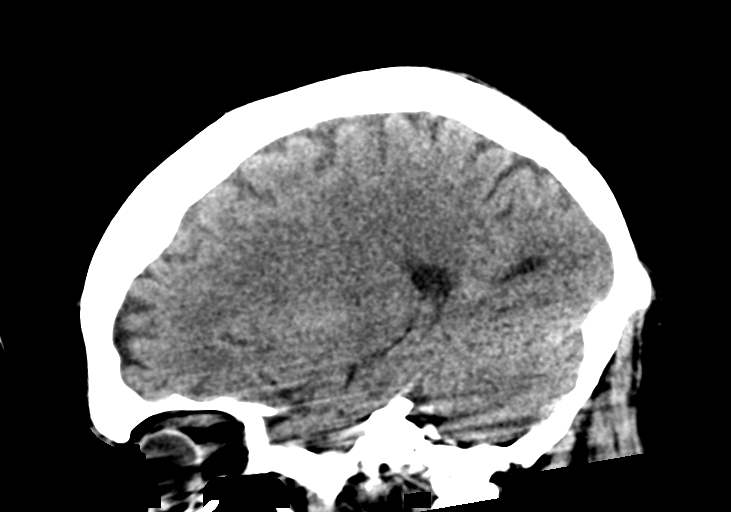

[14 of 47 positions shown; findings below may reference images not displayed]

FINDINGS: Brain: No midline shift, ventriculomegaly, mass effect, evidence of
mass lesion, intracranial hemorrhage or evidence of cortically based
acute infarction. Gray-white matter differentiation is within normal
limits throughout the brain. No convincing encephalomalacia.

Vascular: No suspicious intracranial vascular hyperdensity.

Skull: No acute osseous abnormality identified.

Sinuses/Orbits: Improved paranasal sinus aeration, residual subtotal
fluid opacification of the sphenoid sinuses. Bilateral mastoid
fusions but tympanic cavity aeration appears improved.

Other: Negative orbit and scalp soft tissues.
IMPRESSION: 1. Stable, normal noncontrast CT appearance of the brain.
2. Improving paranasal sinus and middle ear pneumatization.

## 2020-05-22 MED ORDER — ORAL CARE MOUTH RINSE
15.0000 mL | Freq: Two times a day (BID) | OROMUCOSAL | Status: DC
  Administered 2020-05-22 – 2020-06-07 (×30): 15 mL via OROMUCOSAL

## 2020-05-22 MED ORDER — CHLORHEXIDINE GLUCONATE 0.12 % MT SOLN
15.0000 mL | Freq: Two times a day (BID) | OROMUCOSAL | Status: DC
  Administered 2020-05-22 – 2020-06-08 (×33): 15 mL via OROMUCOSAL
  Filled 2020-05-22 (×32): qty 15

## 2020-05-22 MED ORDER — NEPRO/CARBSTEADY PO LIQD
237.0000 mL | Freq: Three times a day (TID) | ORAL | Status: DC
  Administered 2020-05-23 – 2020-06-08 (×39): 237 mL via ORAL

## 2020-05-22 MED ORDER — POTASSIUM CHLORIDE 10 MEQ/100ML IV SOLN
10.0000 meq | INTRAVENOUS | Status: AC
  Administered 2020-05-22 (×5): 10 meq via INTRAVENOUS
  Filled 2020-05-22 (×5): qty 100

## 2020-05-22 NOTE — Evaluation (Signed)
Clinical/Bedside Swallow Evaluation Patient Details  Name: Collin Brown MRN: [id number] Date of Birth: 25-Aug-1985  Today's Date: 05/22/2020 Time: SLP Start Time (ACUTE ONLY): 1045 SLP Stop Time (ACUTE ONLY): 1145 SLP Time Calculation (min) (ACUTE ONLY): 60 min  Past Medical History:  Past Medical History:  Diagnosis Date  . Hearing loss in right ear   . Sleep apnea    Past Surgical History:  Past Surgical History:  Procedure Laterality Date  . TONSILLECTOMY     HPI:  Pt is a 34 yo with OSA, morbid obesity BMI>56 came in with flu like illness diagnosed with COVID19 pneumonia was initially ion Medical floor with hospitalist service initiated Remdesevir and barictitinib, he progressively became more dyspneic he was placed on BIPAP and did not tolerate then placed on HFNC did not tolerate patient himself consented to MV and was intubated in MICU. Mother was notified who is sick at home with COVID.  Pt was orally intubated on 05/05/2020; self-extubated on 05/20/2020.  Wears Bipap at night for support; O2 [place] during day.  Dysphonia + currently.    Assessment / Plan / Recommendation Clinical Impression  Pt appears to present w/ overt s/s of oropharyngeal phase dysphagia c/b min decreased oral phase awareness w/ trials presented and inconsistent throat clearing/coughing w/ trials of thin liquids. Suspect impact from lengthy oral intubation and hospitatalization; Covid19 illness.Pt consumed po trials of modified consistencies (Nectar liquids, Purees) w/ No overt, clinical s/s of aspiration during/post po trials. Same was noted w/ single ice chips. Pt appears at reduced risk for aspiration following general aspiration precautions w/ a modified diet consistency. During po trials, pt exhibited throat clearing and delayed coughing w/ trials of thin liquids via spoon and Cup -- also noted declined Cognitive awareness during po trial tasks(often maintaining an open-mouth posture too long post bolus  delivery requiring light berbal/tactile cues to close mouth). W/ trials of increased textured liquids and purees, oral phase appeared timely and bolus management and clearing was apropriate. No decline in respiratory presentation during/post trials; unable to assess vocal quality d/t Dysphonia/Aphonia. Oral phase appeared [facility] w/ control of bolus propulsion for A-P transfer for swallowing, oral clearing. OM Exam appeared [facility] w/ no unilateral weakness noted; Gag present. Cough congested. Suspect lengthy oral intubation and illness is impacting Dysphonia/Aphonia. Pt may benefit from further Cognitive-linguistic assessment to address needs. Recommend a Dysphagia level 1 (puree) diet w/ moistened foods; Nectar liquids. Recommend general aspiration precautions, Pills crushed vs whole in Puree for safer, easier swallowing. Pt will need Supervision and feeding support at all meals. Education given on Pills in Puree; general aspiration precautions to NSG. ST services will continue to f/u for diet upgrade as appropriate. Updated MD/NSG who agreed.  SLP Visit Diagnosis: Dysphagia, pharyngeal phase (R13.13)    Aspiration Risk  Mild-Mod aspiration risk;Risk for inadequate nutrition/hydration    Diet Recommendation  Dysphagia level 1(puree) w/ moistened foods, Nectar consistency liquids. Aspiration precautions. Feeding supervision and support.   Medication Administration: Crushed with puree (for safer swallowing)    Other  Recommendations Recommended Consults:  (Dietician f/u) Oral Care Recommendations: Oral care BID;Oral care before and after PO;Staff/trained caregiver to provide oral care Other Recommendations: Order thickener from pharmacy;Prohibited food (jello, ice cream, thin soups);Remove water pitcher;Have oral suction available   Follow up Recommendations  (TBD)      Frequency and Duration min 3x week  2 weeks       Prognosis Prognosis for Safe Diet Advancement: Fair Barriers to Reach Goals:  Cognitive deficits;Time post onset;Severity of deficits (extended illness)      Swallow Study   General Date of Onset: 05/03/20 HPI: Pt is a 34 yo with OSA, morbid obesity BMI>56 came in with flu like illness diagnosed with COVID19 pneumonia was initially ion Medical floor with hospitalist service initiated Remdesevir and barictitinib, he progressively became more dyspneic he was placed on BIPAP and did not tolerate then placed on HFNC did not tolerate patient himself consented to MV and was intubated in MICU. Mother was notified who is sick at home with COVID.  Pt was orally intubated on 05/05/2020; self-extubated on 05/20/2020.  Wears Bipap at night for support; O2 [place] during day.  Dysphonia + currently.  Type of Study: Bedside Swallow Evaluation Previous Swallow Assessment: none Diet Prior to this Study: NPO (regular diet at home per pt; supported w/ NG) Temperature Spikes Noted: No (wbc 8.5) Respiratory Status: Nasal cannula History of Recent Intubation: Yes Length of Intubations (days): 16 days Date extubated: 05/20/20 Behavior/Cognition: Alert;Cooperative;Pleasant mood;Distractible;Requires cueing Oral Cavity Assessment: Within Functional Limits Oral Care Completed by SLP: Yes Oral Cavity - Dentition: Adequate natural dentition Vision:  (NT) Self-Feeding Abilities: Total assist Patient Positioning: Upright in bed (needed positioning) Baseline Vocal Quality: Aphonic (primarily) Volitional Cough: Cognitively unable to elicit (nonvolitional adequate) Volitional Swallow: Unable to elicit    Oral/Motor/Sensory Function Overall Oral Motor/Sensory Function: Within functional limits   Ice Chips Ice chips: Within functional limits Presentation: Spoon (fed; 10 trials)   Thin Liquid Thin Liquid: Impaired Presentation: Cup;Spoon (5, 5 each) Oral Phase Impairments: Poor awareness of bolus (min) Pharyngeal  Phase Impairments: Throat Clearing - Delayed;Cough - Delayed (x2)    Nectar Thick  Nectar Thick Liquid: Within functional limits Presentation: Spoon;Straw (~4 ozs total)   Honey Thick Honey Thick Liquid: Not tested   Puree Puree: Within functional limits Presentation: Spoon (fed; 4 ozs)   Solid     Solid: Not tested        Jerilynn Som, MS, CCC-SLP Speech Language Pathologist Rehab Services [phone] Kendarrius Tanzi 05/22/2020,1:23 PM

## 2020-05-22 NOTE — Progress Notes (Signed)
OT Cancellation Note  Patient Details Name: Collin Brown MRN: [id number] DOB: 08/17/1985   Cancelled Treatment:    Reason Eval/Treat Not Completed: Patient at procedure or test/ unavailable.  Pt receiving head CT per chart. Unavailable for OT tx this date.  Richrd Prime, MPH, MS, OTR/L ascom [phone] 05/22/20, 2:15 PM

## 2020-05-22 NOTE — Progress Notes (Signed)
PHARMACY CONSULT NOTE  Pharmacy Consult for Electrolyte Monitoring and Replacement   Recent Labs: Potassium (mmol/L)  Date Value  05/22/2020 3.2 (L)   Magnesium (mg/dL)  Date Value  97/35/3299 2.4   Calcium (mg/dL)  Date Value  24/26/8341 9.0   Albumin (g/dL)  Date Value  96/22/2979 2.8 (L)   Phosphorus (mg/dL)  Date Value  89/21/1941 4.2   Sodium (mmol/L)  Date Value  05/22/2020 148 (H)   Corrected Calcium: 9.76 mg/dL   Assessment: Pharmacy has been consulted for electrolyte replacement in this 34 YOM requiring intubation and admission to the CCU for COVID PNA. Treatment including IV decadron, leading to the need for high amounts of insulin and eventually an insulin drip which has had to be restarted following several failed attempts at transitioning to subcutaneous insulin. He has been transitioned back to subcutaneous insulin. Patient self-extubated 10/10.  Lasix 80mg  IV BID  MIVF: 5% dextrose at 50 mL/hr   Goal of Therapy:  Electrolytes WNL  Plan:   Pending speech eval and diet. Expect hypokalemia s/t diuresis and NPO  10 mEq IV KCl  x 5  Follow-up electrolytes in am  , PharmD 05/22/2020 9:25 AM

## 2020-05-22 NOTE — Plan of Care (Signed)

## 2020-05-22 NOTE — Progress Notes (Signed)
CRITICAL CARE NOTE  34 yo with OSA, morbid obesity BMI>56 came in with flu like illness diagnosed with COVID19 pneumonia was initially ion Medical floor with hospitalist service initiated Remdesevir and barictitinib, he progressively became more dyspneic he was placed on BIPAP and did not tolerate then placed on HFNC did not tolerate patient himself consented to MV and was intubated in MICU. Mother was notified who is sick at home with COVID.   Events:  9/27- patient on 65%FiO2 with proning protocol. CVP trending, goal negative fluid balance.  05/08/20- patient with worsening ventilation increased FiO2 to 100% overnight and had episode of severe bradycardia. Concern for mucus plugging will perform recruitment maneuvers and possible bronchoscopy. We were able to perform tracheal aspirate and have weaned down fIO2 to 75% 05/09/20- patient had bradycardic episode and he desaturated overnight with temporary increase to 100FiO2. Lasix q12 going. I spoke to brother today for update. His tracheal aspirate came back normal flora so we will stop antibiotic.  05/10/20-patient with pneumomediastinum and on maximal setting on ventilator. Poor prognosis very unfortunate young man. I called and updated Mother today [name].  05/11/20- patient weaned to 80%. Will continue with weaning and proning protocol  05/12/20-patient remains critically ill. Mother at bedside we discussed his care plan, she is with strong faith and continues to pray for her son to improve.  05/13/20- patient remains crtically ill. Proning per protocol. No significant events overnight.  05/14/20- Continues to be febrile, FiO2 up to 100%, improved down to 60% after increasing PEEP from 10 to 16.  05/15/20- patient remains crtically ill, overall status unchanged 05/16/20-persistent fevers, CT head and chest, does have sinus opacification. 10/7 severe ARDS 10/8 febrile, severe ARDS 10/9 severe ARDS 10/10 self extubated, placed on  biPAP 10/11 on biPAP tolerating well, precedex 10/12 on biPAP last night   CC  follow up respiratory failure  SUBJECTIVE Patient remains critically ill Prognosis is guarded Self extubated several days ago High risk for intubation   BP (!) 142/84   Pulse (!) 103   Temp (!) 100.4 F (38 C) (Axillary)   Resp (!) 21   Ht 5\' 10"  (1.778 m)   Wt (!) 166.8 kg   SpO2 99%   BMI 52.76 kg/m    I/O last 3 completed shifts: In: 2883.4 [I.V.:1662.8; IV Piggyback:1220.6] Out: 6595 [Urine:6595] No intake/output data recorded.  SpO2: 99 % O2 Flow Rate (L/min): 55 L/min FiO2 (%): 86 %  Estimated body mass index is 52.76 kg/m as calculated from the following:   Height as of this encounter: 5\' 10"  (1.778 m).   Weight as of this encounter: 166.8 kg.  SIGNIFICANT EVENTS   REVIEW OF SYSTEMS  PATIENT IS UNABLE TO PROVIDE COMPLETE REVIEW OF SYSTEMS DUE TO SEVERE SOB  Pressure Injury 05/12/20 Toe (Comment  which one) Anterior;Right Stage 2 -  Partial thickness loss of dermis presenting as a shallow open injury with a red, pink wound bed without slough. (Active)  05/12/20 1930  Location: Toe (Comment  which one)  Location Orientation: Anterior;Right  Staging: Stage 2 -  Partial thickness loss of dermis presenting as a shallow open injury with a red, pink wound bed without slough.  Wound Description (Comments):   Present on Admission: No     Pressure Injury 05/12/20 Toe (Comment  which one) Anterior;Left Stage 2 -  Partial thickness loss of dermis presenting as a shallow open injury with a red, pink wound bed without slough. (Active)  05/12/20 1930  Location:  Toe (Comment  which one)  Location Orientation: Anterior;Left  Staging: Stage 2 -  Partial thickness loss of dermis presenting as a shallow open injury with a red, pink wound bed without slough.  Wound Description (Comments):   Present on Admission: No     Pressure Injury 05/21/20 Buttocks Right;Left purple/white (Active)   05/21/20 2123  Location: Buttocks  Location Orientation: Right;Left  Staging:   Wound Description (Comments): purple/white  Present on Admission:     COVID-19 DISASTER DECLARATION:   FULL CONTACT PHYSICAL EXAMINATION WAS NOT POSSIBLE DUE TO TREATMENT OF COVID-19  AND CONSERVATION OF PERSONAL PROTECTIVE EQUIPMENT, LIMITED EXAM FINDINGS INCLUDE-   PHYSICAL EXAMINATION:  GENERAL:critically ill appearing, +resp distress on biPAP NEUROLOGIC: lethargic   Patient assessed or the symptoms described in the history of present illness.  In the context of the Global COVID-19 pandemic, which necessitated consideration that the patient might be at risk for infection with the SARS-CoV-2 virus that causes COVID-19, Institutional protocols and algorithms that pertain to the evaluation of patients at risk for COVID-19 are in a state of rapid change based on information released by regulatory bodies including the CDC and federal and state organizations. These policies and algorithms were followed during the patient's care while in hospital.    MEDICATIONS: I have reviewed all medications and confirmed regimen as documented   CULTURE RESULTS   Recent Results (from the past 240 hour(s))  Culture, respiratory (non-expectorated)     Status: None   Collection Time: 05/14/20  8:58 AM   Specimen: Tracheal Aspirate; Respiratory  Result Value Ref Range Status   Specimen Description   Final    TRACHEAL ASPIRATE Performed at [facility], [address], [place], [place] [address]    Special Requests   Final    NONE Performed at [facility], [address] [place], [place] [address]    Gram Stain   Final    RARE WBC PRESENT,BOTH PMN AND MONONUCLEAR NO ORGANISMS SEEN    Culture   Final    RARE Normal respiratory flora-no Staph aureus or Pseudomonas seen Performed at [facility], [address] [place], [place] [address]    Report Status 05/16/2020 FINAL  Final   CULTURE, BLOOD (ROUTINE X 2) w Reflex to ID Panel     Status: None   Collection Time: 05/14/20  9:33 AM   Specimen: BLOOD LEFT HAND  Result Value Ref Range Status   Specimen Description BLOOD LEFT HAND  Final   Special Requests   Final    BOTTLES DRAWN AEROBIC AND ANAEROBIC Blood Culture adequate volume   Culture   Final    NO GROWTH 7 DAYS Performed at [facility], [address] [place], [place] [address]    Report Status 05/21/2020 FINAL  Final  CULTURE, BLOOD (ROUTINE X 2) w Reflex to ID Panel     Status: None   Collection Time: 05/14/20 10:17 AM   Specimen: BLOOD LEFT HAND  Result Value Ref Range Status   Specimen Description BLOOD LEFT HAND  Final   Special Requests   Final    BOTTLES DRAWN AEROBIC AND ANAEROBIC Blood Culture adequate volume   Culture   Final    NO GROWTH 7 DAYS Performed at [facility], [address] [place], [place] [address]    Report Status 05/21/2020 FINAL  Final  Urine Culture     Status: None   Collection Time: 05/14/20  2:33 PM   Specimen:  Urine, Random  Result Value Ref Range Status   Specimen Description   Final    URINE, RANDOM Performed at [facility], [address] [place], [place] [address]    Special Requests   Final    NONE Performed at [facility], [address] [place], [place] [address]    Culture   Final    NO GROWTH Performed at [facility], [address] [place], [place] [address]    Report Status 05/15/2020 FINAL  Final        FiO2 (%):  [75 %-100 %] 86 % CBC    Component Value Date/Time   WBC 8.5 05/22/2020 0525   RBC 3.37 (L) 05/22/2020 0525   HGB 9.5 (L) 05/22/2020 0525   HCT 31.2 (L) 05/22/2020 0525   PLT 220 05/22/2020 0525   MCV 92.6 05/22/2020 0525   MCH 28.2 05/22/2020 0525   MCHC 30.4 05/22/2020 0525   RDW 18.5 (H) 05/22/2020 0525   LYMPHSABS 0.9 05/22/2020 0525   MONOABS 0.5 05/22/2020 0525   EOSABS 0.1 05/22/2020 0525   BASOSABS 0.0  05/22/2020 0525   BMP Latest Ref Rng & Units 05/22/2020 05/21/2020 05/20/2020  Glucose 70 - 99 mg/dL 983(J) 93 825(K)  BUN 6 - 20 mg/dL 53(Z) 76(B) 34(L)  Creatinine 0.61 - 1.24 mg/dL 9.37 9.02 4.09  Sodium 135 - 145 mmol/L 148(H) 153(H) 148(H)  Potassium 3.5 - 5.1 mmol/L 3.2(L) 3.4(L) 3.4(L)  Chloride 98 - 111 mmol/L 104 108 104  CO2 22 - 32 mmol/L 35(H) 32 34(H)  Calcium 8.9 - 10.3 mg/dL 9.0 7.3(Z) 3.2(D)        ASSESSMENT AND PLAN SYNOPSIS  Acute hypoxemic respiratory failure due to COVID-19 pneumonia / ARDS Wean off biPAP Oxygen as needed High risk for intubation Diamox/lasix as tolerated   Severe ACUTE Hypoxic and Hypercapnic Respiratory Failure On biPAP    Morbid obesity, possible OSA.   Will certainly impact respiratory mechanics,     NEUROLOGY precedex as needed  CARDIAC ICU monitoring  ID -continue IV abx as prescibed -follow up cultures  GI GI PROPHYLAXIS as indicated   DIET-->NPO Constipation protocol as indicated  ENDO - will use ICU hypoglycemic\Hyperglycemia protocol if indicated     ELECTROLYTES -follow labs as needed -replace as needed -pharmacy consultation and following   DVT/GI PRX ordered and assessed TRANSFUSIONS AS NEEDED MONITOR FSBS I Assessed the need for Labs I Assessed the need for Foley I Assessed the need for Central Venous Line Family Discussion when available I Assessed the need for Mobilization I made an Assessment of medications to be adjusted accordingly Safety Risk assessment completed   CASE DISCUSSED IN MULTIDISCIPLINARY ROUNDS WITH ICU TEAM  Critical Care Time devoted to patient care services described in this note is 35 minutes.   Overall, patient is critically ill, prognosis is guarded.  Lucie Leather, M.D.  [facility]  Medical Director [facility] Medical Director [facility] Cardio-Pulmonary Department

## 2020-05-22 NOTE — Progress Notes (Signed)
Physical Therapy Treatment Patient Details Name: Collin Brown MRN: [id number] DOB: 1985/11/03 Today's Date: 05/22/2020    History of Present Illness Collin Brown is a34yoM who [facility] 9/[facility] after progression of dyspnea. Pt recently (+)COVID19 along with other family. Pt required intubation on 9/25, self extubated on 10/10. At evaluation pt presenting on HHHF @55L /min, 90%FiO2.    PT Comments    Pt awake upon entry, remains on 55L HHHF, FiO2 down from 1 DA now in 70s. Hr remains 110-125bpm throughout, SpO2 90-92%. Pt remains calm, somewhat flat affect, denies pain, yawning a few times does apepar to struggle with attending to exercises over 10 reps, but responds well to encouragement. Strength in limbs unchanged from previous day. Remains 2-/5 on Right side and 1/5 on Left. Difficulty to see more than a few flickers of motor activity with Left hand and foot. Right hand and foot are 2/5. Pt continues to have severe expressive communication deficit, suspect secondary to prolonged intubation, pt has hoarseness, but also severe weakness of abdominal muscles. Few attempts to voice words are difficult to hear, difficult to understand, and with rhythm dictated by dyspnea. Head CT now completed, negative for acute infarct, however pt does continued to have difficulty with Left sided neglect- inattention may be a better word, as pt makes small attempts to track author visually at times with eyes, but cervical rotation to left is labor some. Left cervical weakness could be a DD. Author relays to patient that he spoke to pt's brother yesterday to obtain PLOF. Author asks pt if pt was able to see his mother yesterday, he nods yes twice in response, then becomes sad and begins to cry. Pt offered comforting, empathetic words, but with expressive difficulty it remains difficult to understand the emotional load on this patient. Will continue to follow.   Follow Up Recommendations  Supervision for  mobility/OOB;Supervision - Intermittent;LTACH     Equipment Recommendations       Recommendations for Other Services       Precautions / Restrictions Precautions Precautions: Fall Precaution Comments: significant deconditioning and weakness, struggling Brown limb control; left neglect Restrictions Weight Bearing Restrictions: No    Mobility  Bed Mobility               General bed mobility comments: not performed this session  Transfers                    Ambulation/Gait                 Stairs             Wheelchair Mobility    Modified Rankin (Stroke Patients Only)       Balance                                            Cognition Arousal/Alertness: Awake/alert Behavior During Therapy: WFL for tasks assessed/performed;Flat affect Overall Cognitive Status: Impaired/Different from baseline                     Current Attention Level: Alternating;Divided   Following Commands: Follows one step commands consistently;Follows one step commands with increased time     Problem Solving: Slow processing;Decreased initiation;Requires verbal cues        Exercises Low Level/ICU Exercises Ankle Circles/Pumps: Both;PROM;20 reps;Supine;AAROM Short Arc Quad ;Both;20 reps;PROM;Supine Hip Extension:  Strengthening;Both;Right;20 reps;Supine Hip ABduction/ADduction: AAROM;Both;20 reps;Supine;PROM Shoulder Flexion: AAROM;Right;10 reps Elbow Flexion: AAROM;Right;10 reps Other Exercises Other Exercises: Left cervical rotation AA/ROM 1x10    General Comments        Pertinent Vitals/Pain Pain Assessment: No/denies pain    Home Living                      Prior Function            PT Goals (current goals can now be found in the care plan section) Acute Rehab PT Goals Patient Stated Goal: pt unable to verbalize PT Goal Formulation: Patient unable to participate in goal setting Time For Goal  Achievement: 06/18/20 Potential to Achieve Goals: Poor Progress towards PT goals: Not progressing toward goals - comment    Frequency    Min 2X/week      PT Plan Current plan remains appropriate    Co-evaluation              AM-PAC PT "6 Clicks" Mobility   Outcome Measure  Help needed turning from your back to your side while in a flat bed without using bedrails?: Total Help needed moving from lying on your back to sitting on the side of a flat bed without using bedrails?: Total Help needed moving to and from a bed to a chair (including a wheelchair)?: Total Help needed standing up from a chair using your arms (e.g., wheelchair or bedside chair)?: Total Help needed to walk in hospital room?: Total Help needed climbing 3-5 steps with a railing? : Total 6 Click Score: 6    End of Session Equipment Utilized During Treatment: Oxygen Activity Tolerance: Patient tolerated treatment well;Patient limited by fatigue Patient left: in bed;with family/visitor present Nurse Communication: Mobility status PT Visit Diagnosis: Difficulty in walking, not elsewhere classified (R26.2);Other abnormalities of gait and mobility (R26.89);Muscle weakness (generalized) (M62.81)     Time: 7253-6644 PT Time Calculation (min) (ACUTE ONLY): 35 min  Charges:  $Therapeutic Exercise: 8-22 mins $Therapeutic Activity: 8-22 mins                     4:48 PM, 05/22/20 Rosamaria Lints, PT, DPT Physical Therapist - [facility]  [phone] (ASCOM)    Collin Brown 05/22/2020, 4:39 PM

## 2020-05-22 NOTE — Progress Notes (Signed)
Shift summary:  - Patient with SLP eval this AM; now has a diet order and is taking POs.  - Continues to tolerate HFNC by day, BiPAP by night.  - Foley removed and replaced by condom cath.

## 2020-05-23 DIAGNOSIS — J9601 Acute respiratory failure with hypoxia: Secondary | ICD-10-CM | POA: Diagnosis not present

## 2020-05-23 DIAGNOSIS — U071 COVID-19: Secondary | ICD-10-CM | POA: Diagnosis not present

## 2020-05-23 DIAGNOSIS — J1282 Pneumonia due to Coronavirus disease 2019: Secondary | ICD-10-CM | POA: Diagnosis not present

## 2020-05-23 LAB — GLUCOSE, CAPILLARY
Glucose-Capillary: 113 mg/dL — ABNORMAL HIGH (ref 70–99)
Glucose-Capillary: 126 mg/dL — ABNORMAL HIGH (ref 70–99)
Glucose-Capillary: 198 mg/dL — ABNORMAL HIGH (ref 70–99)
Glucose-Capillary: 242 mg/dL — ABNORMAL HIGH (ref 70–99)
Glucose-Capillary: 213 mg/dL — ABNORMAL HIGH (ref 70–99)
Glucose-Capillary: 139 mg/dL — ABNORMAL HIGH (ref 70–99)

## 2020-05-23 LAB — CBC WITH DIFFERENTIAL/PLATELET
Abs Immature Granulocytes: 0.04 10*3/uL (ref 0.00–0.07)
Basophils Absolute: 0 10*3/uL (ref 0.0–0.1)
Basophils Relative: 0 %
Eosinophils Absolute: 0.2 10*3/uL (ref 0.0–0.5)
Eosinophils Relative: 2 %
HCT: 32.6 % — ABNORMAL LOW (ref 39.0–52.0)
Hemoglobin: 9.7 g/dL — ABNORMAL LOW (ref 13.0–17.0)
Immature Granulocytes: 1 %
Lymphocytes Relative: 17 %
Lymphs Abs: 1.3 10*3/uL (ref 0.7–4.0)
MCH: 27.3 pg (ref 26.0–34.0)
MCHC: 29.8 g/dL — ABNORMAL LOW (ref 30.0–36.0)
MCV: 91.8 fL (ref 80.0–100.0)
Monocytes Absolute: 0.4 10*3/uL (ref 0.1–1.0)
Monocytes Relative: 5 %
Neutro Abs: 5.9 10*3/uL (ref 1.7–7.7)
Neutrophils Relative %: 75 %
Platelets: 237 10*3/uL (ref 150–400)
RBC: 3.55 MIL/uL — ABNORMAL LOW (ref 4.22–5.81)
RDW: 18.5 % — ABNORMAL HIGH (ref 11.5–15.5)
WBC: 7.9 10*3/uL (ref 4.0–10.5)
nRBC: 0 % (ref 0.0–0.2)

## 2020-05-23 LAB — BASIC METABOLIC PANEL
Anion gap: 9 (ref 5–15)
BUN: 41 mg/dL — ABNORMAL HIGH (ref 6–20)
CO2: 36 mmol/L — ABNORMAL HIGH (ref 22–32)
Calcium: 8.9 mg/dL (ref 8.9–10.3)
Chloride: 104 mmol/L (ref 98–111)
Creatinine, Ser: 0.75 mg/dL (ref 0.61–1.24)
GFR, Estimated: >60 mL/min (ref >60)
Glucose, Bld: 133 mg/dL — ABNORMAL HIGH (ref 70–99)
Potassium: 3.5 mmol/L (ref 3.5–5.1)
Sodium: 149 mmol/L — ABNORMAL HIGH (ref 135–145)

## 2020-05-23 LAB — PHOSPHORUS: Phosphorus: 4.1 mg/dL (ref 2.5–4.6)

## 2020-05-23 LAB — MAGNESIUM: Magnesium: 2.3 mg/dL (ref 1.7–2.4)

## 2020-05-23 MED ORDER — ZINC SULFATE 220 (50 ZN) MG PO CAPS
220.0000 mg | ORAL_CAPSULE | Freq: Every day | ORAL | Status: DC
  Administered 2020-05-24 – 2020-05-26 (×3): 220 mg via ORAL
  Filled 2020-05-23 (×3): qty 1

## 2020-05-23 MED ORDER — IBUPROFEN 100 MG/5ML PO SUSP
600.0000 mg | Freq: Three times a day (TID) | ORAL | Status: DC | PRN
  Filled 2020-05-23: qty 30

## 2020-05-23 MED ORDER — ADULT MULTIVITAMIN W/MINERALS CH
1.0000 | ORAL_TABLET | Freq: Every day | ORAL | Status: DC
  Administered 2020-05-24 – 2020-06-08 (×16): 1 via ORAL
  Filled 2020-05-23 (×16): qty 1

## 2020-05-23 MED ORDER — POLYETHYLENE GLYCOL 3350 17 G PO PACK
17.0000 g | PACK | Freq: Every day | ORAL | Status: DC
  Administered 2020-05-24 – 2020-05-29 (×6): 17 g via ORAL
  Filled 2020-05-23 (×5): qty 1

## 2020-05-23 MED ORDER — ASCORBIC ACID 500 MG PO TABS
500.0000 mg | ORAL_TABLET | Freq: Every day | ORAL | Status: DC
  Administered 2020-05-24 – 2020-06-08 (×16): 500 mg via ORAL
  Filled 2020-05-23 (×16): qty 1

## 2020-05-23 MED ORDER — ASPIRIN 81 MG PO CHEW
81.0000 mg | CHEWABLE_TABLET | Freq: Every day | ORAL | Status: DC
  Administered 2020-05-24 – 2020-06-08 (×16): 81 mg via ORAL
  Filled 2020-05-23 (×16): qty 1

## 2020-05-23 NOTE — Progress Notes (Signed)
PHARMACY CONSULT NOTE  Pharmacy Consult for Electrolyte Monitoring and Replacement   Recent Labs: Potassium (mmol/L)  Date Value  05/23/2020 3.5   Magnesium (mg/dL)  Date Value  68/07/7516 2.3   Calcium (mg/dL)  Date Value  00/17/4944 8.9   Albumin (g/dL)  Date Value  96/75/9163 2.8 (L)   Phosphorus (mg/dL)  Date Value  84/66/5993 4.1   Sodium (mmol/L)  Date Value  05/23/2020 149 (H)   Corrected Calcium: 9.76 mg/dL   Assessment: Pharmacy has been consulted for electrolyte replacement in this 34 YOM requiring intubation and admission to the CCU for COVID PNA. Treatment including IV decadron, leading to the need for high amounts of insulin and eventually an insulin drip which has had to be restarted following several failed attempts at transitioning to subcutaneous insulin. He has been transitioned back to subcutaneous insulin. Patient self-extubated 10/10.  Lasix 80mg  IV BID discontinued 10/12  MIVF: 5% dextrose at 50 mL/hr   Goal of Therapy:  Electrolytes WNL  Plan:   Speech eval completed 10/12. Patient started on dysphagia diet. Expect electrolytes to improve/stabilize as diet improves  Follow-up electrolytes in am  12/12, PharmD 05/23/2020 10:57 AM

## 2020-05-23 NOTE — Progress Notes (Signed)
Speech Language Pathology Treatment: Dysphagia  Patient Details Name: Collin Brown MRN: [id number] DOB: Jun 03, 1986 Today's Date: 05/23/2020 Time: 1210-1250 SLP Time Calculation (min) (ACUTE ONLY): 40 min  Assessment / Plan / Recommendation Clinical Impression  Pt seen for ongoing assessment of swallowing. He appears much improved and alert today; more engaged to verbally respond and able to follow instructions w/ min cues. Pt remains on HFNC 50%; wbc wnl. Vocal quality and volume improved post extubation. Continues to need cues for full follow through w/ tasks.   Pt was eating the beginnings of his upgraded Mech Soft meal w/ NA assisting. SLP arrived to continue the meal w/ pt and assess toleration of upgraded diet consistency; give education of aspiration precautions needed during all oral intake. SLP explained and modeled general aspiration precautions; he agreed verbally to the need for following them especially sitting upright for all oral intake and drinking liquids slowly. Pt consumed trials of mech soft foods and Nectar liquids w/ no overt clinical s/s of aspiration noted except x1 trial w/ consecutive/multiple sips of Nectar liquids via straw. W/ single, slower/small sips, no further coughing or other s/s of aspiration noted; respiratory status remained calm and unlabored, O2 sats 93-94%. RR low 20s. Pt exhibited adequate oral phase management of the semi-solids w/ timely bolus management and mastication of boluses; oral clearing timely and appropriate. Pt denied any difficulty chewing the solid foods. Encouraged him to alternate foods/liquids to aid clearing as needed.   Recommend upgrade to Dysphagia level 3 diet (mech soft) w/ gravies added to moisten foods; Nectar liquids. Recommend general aspiration precautions; Pills Whole in Puree -  the puree providing cohesion for swallowing tablets. Tray setup and positioning assistance for meals; support w/ feeding at this time. ST services will  continue to f/u w/ pt for toleration of diet, trials to upgrade liquids in diet when appropriate, and education as needed while admitted. NSG updated. Precautions posted at bedside.     HPI HPI: Pt is a 34 yo with OSA, morbid obesity BMI>56 came in with flu like illness diagnosed with COVID19 pneumonia was initially ion Medical floor with hospitalist service initiated Remdesevir and barictitinib, he progressively became more dyspneic he was placed on BIPAP and did not tolerate then placed on HFNC did not tolerate patient himself consented to MV and was intubated in MICU. Mother was notified who is sick at home with COVID.  Pt was orally intubated on 05/05/2020; self-extubated on 05/20/2020.  Wears Bipap at night for support; O2 [place] during day.  Dysphonia + currently.       SLP Plan  Continue with current plan of care       Recommendations  Diet recommendations: Dysphagia 3 (mechanical soft);Nectar-thick liquid Liquids provided via: Cup;Straw Medication Administration: Whole meds with puree (for safer swallowing) Supervision: Staff to assist with self feeding;Full supervision/cueing for compensatory strategies Compensations: Minimize environmental distractions;Slow rate;Small sips/bites;Lingual sweep for clearance of pocketing;Follow solids with liquid Postural Changes and/or Swallow Maneuvers: Seated upright 90 degrees;Upright 30-60 min after meal                General recommendations:  (Dietician f/u) Oral Care Recommendations: Oral care BID;Oral care before and after PO;Staff/trained caregiver to provide oral care Follow up Recommendations:  (TBD) SLP Visit Diagnosis: Dysphagia, pharyngeal phase (R13.13) Plan: Continue with current plan of care       GO                 Jerilynn Som, MS, CCC-SLP  Speech Language Pathologist Rehab Services [phone] Gautam Langhorst 05/23/2020, 1:46 PM

## 2020-05-23 NOTE — Progress Notes (Addendum)
Physical Therapy Treatment Patient Details Name: Collin Brown MRN: [id number] DOB: Jan 02, 1986 Today's Date: 05/23/2020    History of Present Illness Collin Brown is a34yoM who [facility] after progression of dyspnea. Pt recently (+)COVID19 along with other family. Pt required intubation on 9/25, self extubated on 10/10. At evaluation pt presenting on HHHF @55L /min, 90%FiO2.    PT Comments    O2 needs adjusted this date prior to entry: 50L/min, FiO2: 52, HR: 112bpm, SpO2: 93%  Pt in bed upon entry, awake, watch TV. Bed remains angled toward TV as positioned yesterday to prevent sustained Rt cervical flexion. Pt more alert today, smiling intermittently, chortling in response to jokes. Phonation minimally louder than previous day. Pt appears excited to demonstrate improved elbow and wrist control (A/ROM) and with improved control of humeral rotation for targeted Rt hand to mouth. Pt participated in assist exercise of 4 limbs. All limbs a bit stronger this date than previous, except for LUE which remains most trace activation. Pt also assisted with cervical exercise, perhaps most telling of global weakness, as pt struggles to perform cervical retraction with any significant force, albeit high level effort is obvious during performance. Pt continues to require totalA (>75% assist) for movement of all limbs for exercise. Pt education on sustained phonation exercise as well to facilitate abdominal control. Pt somewhat better able to communicate verbally, but breaths after each word, and speaks word very quickly with a breathy tone to get it all out- still difficult to understand, but able to make out around 10-12 words this date.    Follow Up Recommendations  Supervision for mobility/OOB;Supervision - Intermittent;LTACH     Equipment Recommendations       Recommendations for Other Services       Precautions / Restrictions Precautions Precautions: Fall Precaution Comments:  significant deconditioning and weakness, struggling c limb control; ?left neglect Restrictions Weight Bearing Restrictions: No    Mobility  Bed Mobility               General bed mobility comments: not performed this session  Transfers                    Ambulation/Gait                 Stairs             Wheelchair Mobility    Modified Rankin (Stroke Patients Only)       Balance                                            Cognition Arousal/Alertness: Awake/alert Behavior During Therapy: WFL for tasks assessed/performed (more alert, emotive, smiling intermittently) Overall Cognitive Status: Within Functional Limits for tasks assessed                                        Exercises General Exercises - Lower Extremity Ankle Circles/Pumps: AROM;5 reps;Supine;Both Short Arc Quad: AAROM;Both;15 reps;Supine Heel Slides: AAROM;Both;15 reps;Supine Other Exercises Other Exercises: BUE AA/ROM chest press 1x12 bilat (MaxA required) Other Exercises: Sustained phonation 10x3sec (say 'Ahhhhhhh'); tactile cues for ABD contraction, cues for volume and length Other Exercises: Left cervical rotation AA/ROM 1x12 Other Exercises: Manually resisted Leg press 1x15 bilat Other Exercises: AA/ROM Reverse Butterfly (hip  ADD/IR) 1x15 bilat Cervical retraction into authors hand 1x12x3secH    General Comments        Pertinent Vitals/Pain Pain Assessment: No/denies pain    Home Living                      Prior Function            PT Goals (current goals can now be found in the care plan section) Acute Rehab PT Goals Patient Stated Goal: pt unable to verbalize PT Goal Formulation: Patient unable to participate in goal setting Time For Goal Achievement: 06/18/20 Potential to Achieve Goals: Fair Progress towards PT goals: Progressing toward goals    Frequency    Min 2X/week      PT Plan Current plan  remains appropriate    Co-evaluation              AM-PAC PT "6 Clicks" Mobility   Outcome Measure  Help needed turning from your back to your side while in a flat bed without using bedrails?: Total Help needed moving from lying on your back to sitting on the side of a flat bed without using bedrails?: Total Help needed moving to and from a bed to a chair (including a wheelchair)?: Total Help needed standing up from a chair using your arms (e.g., wheelchair or bedside chair)?: Total Help needed to walk in hospital room?: Total Help needed climbing 3-5 steps with a railing? : Total 6 Click Score: 6    End of Session Equipment Utilized During Treatment: Oxygen Activity Tolerance: Patient tolerated treatment well;No increased pain Patient left: in bed Nurse Communication: Mobility status (Pt needs to place meal order; Pt reports need to have BM) PT Visit Diagnosis: Difficulty in walking, not elsewhere classified (R26.2);Other abnormalities of gait and mobility (R26.89);Muscle weakness (generalized) (M62.81)     Time: 4132-4401 PT Time Calculation (min) (ACUTE ONLY): 29 min  Charges:  $Therapeutic Exercise: 23-37 mins                     4:40 PM, 05/23/20 Rosamaria Lints, PT, DPT Physical Therapist - [facility]  [phone] (ASCOM)    Kavya Haag C 05/23/2020, 4:34 PM

## 2020-05-23 NOTE — Progress Notes (Signed)
CRITICAL CARE NOTE 34 yo with OSA, morbid obesity BMI>56 came in with flu like illness diagnosed with COVID19 pneumonia was initially ion Medical floor with hospitalist service initiated Remdesevir and barictitinib, he progressively became more dyspneic he was placed on BIPAP and did not tolerate then placed on HFNC did not tolerate patient himself consented to MV and was intubated in MICU. Mother was notified who is sick at home with COVID.   Events:  9/27- patient on 65%FiO2 with proning protocol. CVP trending, goal negative fluid balance.  05/08/20- patient with worsening ventilation increased FiO2 to 100% overnight and had episode of severe bradycardia. Concern for mucus plugging will perform recruitment maneuvers and possible bronchoscopy. We were able to perform tracheal aspirate and have weaned down fIO2 to 75% 05/09/20- patient had bradycardic episode and he desaturated overnight with temporary increase to 100FiO2. Lasix q12 going. I spoke to brother today for update. His tracheal aspirate came back normal flora so we will stop antibiotic.  05/10/20-patient with pneumomediastinum and on maximal setting on ventilator. Poor prognosis very unfortunate young man. I called and updated Mother today [name].  05/11/20- patient weaned to 80%. Will continue with weaning and proning protocol  05/12/20-patient remains critically ill. Mother at bedside we discussed his care plan, she is with strong faith and continues to pray for her son to improve.  05/13/20- patient remains crtically ill. Proning per protocol. No significant events overnight.  05/14/20- Continues to be febrile, FiO2 up to 100%, improved down to 60% after increasing PEEP from 10 to 16.  05/15/20- patient remains crtically ill, overall status unchanged 05/16/20-persistent fevers, CT head and chest, does have sinus opacification. 10/7 severe ARDS 10/8 febrile, severe ARDS 10/9 severe ARDS 10/10 self extubated, placed on  biPAP 10/11 on biPAP tolerating well, precedex 10/12 on biPAP last night 10/13 biPAP last night   CC  follow up respiratory failure  SUBJECTIVE Patient remains critically ill Prognosis is guarded COVID 19 pneuomonia Alert and awake High risk for re-intubation  FiO2 (%):  [70 %-86 %] 70 %  CBC    Component Value Date/Time   WBC 7.9 05/23/2020 0615   RBC 3.55 (L) 05/23/2020 0615   HGB 9.7 (L) 05/23/2020 0615   HCT 32.6 (L) 05/23/2020 0615   PLT 237 05/23/2020 0615   MCV 91.8 05/23/2020 0615   MCH 27.3 05/23/2020 0615   MCHC 29.8 (L) 05/23/2020 0615   RDW 18.5 (H) 05/23/2020 0615   LYMPHSABS 1.3 05/23/2020 0615   MONOABS 0.4 05/23/2020 0615   EOSABS 0.2 05/23/2020 0615   BASOSABS 0.0 05/23/2020 0615   BMP Latest Ref Rng & Units 05/23/2020 05/22/2020 05/21/2020  Glucose 70 - 99 mg/dL 161(W133(H) 960(A129(H) 93  BUN 6 - 20 mg/dL 54(U41(H) 98(J45(H) 19(J52(H)  Creatinine 0.61 - 1.24 mg/dL 4.780.75 2.950.69 6.210.81  Sodium 135 - 145 mmol/L 149(H) 148(H) 153(H)  Potassium 3.5 - 5.1 mmol/L 3.5 3.2(L) 3.4(L)  Chloride 98 - 111 mmol/L 104 104 108  CO2 22 - 32 mmol/L 36(H) 35(H) 32  Calcium 8.9 - 10.3 mg/dL 8.9 9.0 3.0(Q8.8(L)      BP (!) 154/84   Pulse (!) 108   Temp 99.8 F (37.7 C) (Axillary)   Resp (!) 21   Ht 5\' 10"  (1.778 m)   Wt (!) 172 kg   SpO2 96%   BMI 54.41 kg/m    I/O last 3 completed shifts: In: 2621.8 [I.V.:1774.8; IV Piggyback:846.9] Out: 3130 [Urine:3130] No intake/output data recorded.  SpO2: 96 % O2  Flow Rate (L/min): 55 L/min FiO2 (%): 70 %  Estimated body mass index is 54.41 kg/m as calculated from the following:   Height as of this encounter: 5\' 10"  (1.778 m).   Weight as of this encounter: 172 kg.   Review of Systems:  Gen: nonverbal, on biPAP Resp:  +shortness of breath, Other:  All other systems negative   PATIENT IS UNABLE TO PROVIDE COMPLETE REVIEW OF SYSTEMS DUE TO SEVERE CRITICAL ILLNESS   Pressure Injury 05/12/20 Toe (Comment  which one) Anterior;Right  Stage 2 -  Partial thickness loss of dermis presenting as a shallow open injury with a red, pink wound bed without slough. (Active)  05/12/20 1930  Location: Toe (Comment  which one)  Location Orientation: Anterior;Right  Staging: Stage 2 -  Partial thickness loss of dermis presenting as a shallow open injury with a red, pink wound bed without slough.  Wound Description (Comments):   Present on Admission: No     Pressure Injury 05/12/20 Toe (Comment  which one) Anterior;Left Stage 2 -  Partial thickness loss of dermis presenting as a shallow open injury with a red, pink wound bed without slough. (Active)  05/12/20 1930  Location: Toe (Comment  which one)  Location Orientation: Anterior;Left  Staging: Stage 2 -  Partial thickness loss of dermis presenting as a shallow open injury with a red, pink wound bed without slough.  Wound Description (Comments):   Present on Admission: No     Pressure Injury 05/21/20 Buttocks Right;Left purple/white (Active)  05/21/20 2123  Location: Buttocks  Location Orientation: Right;Left  Staging:   Wound Description (Comments): purple/white  Present on Admission:     COVID-19 DISASTER DECLARATION:   FULL CONTACT PHYSICAL EXAMINATION WAS NOT POSSIBLE DUE TO TREATMENT OF COVID-19  AND CONSERVATION OF PERSONAL PROTECTIVE EQUIPMENT, LIMITED EXAM FINDINGS INCLUDE-   PHYSICAL EXAMINATION:  GENERAL:critically ill appearing, +resp distress NEUROLOGIC:alert and awake, nonverbal   Patient assessed or the symptoms described in the history of present illness.  In the context of the Global COVID-19 pandemic, which necessitated consideration that the patient might be at risk for infection with the SARS-CoV-2 virus that causes COVID-19, Institutional protocols and algorithms that pertain to the evaluation of patients at risk for COVID-19 are in a state of rapid change based on information released by regulatory bodies including the CDC and federal and state  organizations. These policies and algorithms were followed during the patient's care while in hospital.    MEDICATIONS: I have reviewed all medications and confirmed regimen as documented   CULTURE RESULTS   Recent Results (from the past 240 hour(s))  Culture, respiratory (non-expectorated)     Status: None   Collection Time: 05/14/20  8:58 AM   Specimen: Tracheal Aspirate; Respiratory  Result Value Ref Range Status   Specimen Description   Final    TRACHEAL ASPIRATE Performed at [facility], [address] [place], [place]    Special Requests   Final    NONE Performed at [facility], [address] [place], [place]    Gram Stain   Final    RARE WBC PRESENT,BOTH PMN AND MONONUCLEAR NO ORGANISMS SEEN    Culture   Final    RARE Normal respiratory flora-no Staph aureus or Pseudomonas seen Performed at [facility], [address] [place], [place]    Report Status 05/16/2020 FINAL  Final  CULTURE, BLOOD (ROUTINE X 2) w Reflex to ID Panel  Status: None   Collection Time: 05/14/20  9:33 AM   Specimen: BLOOD LEFT HAND  Result Value Ref Range Status   Specimen Description BLOOD LEFT HAND  Final   Special Requests   Final    BOTTLES DRAWN AEROBIC AND ANAEROBIC Blood Culture adequate volume   Culture   Final    NO GROWTH 7 DAYS Performed at [facility], [address] [place], [place] [address]    Report Status 05/21/2020 FINAL  Final  CULTURE, BLOOD (ROUTINE X 2) w Reflex to ID Panel     Status: None   Collection Time: 05/14/20 10:17 AM   Specimen: BLOOD LEFT HAND  Result Value Ref Range Status   Specimen Description BLOOD LEFT HAND  Final   Special Requests   Final    BOTTLES DRAWN AEROBIC AND ANAEROBIC Blood Culture adequate volume   Culture   Final    NO GROWTH 7 DAYS Performed at [facility], [address], [place], [place] [address]    Report Status 05/21/2020 FINAL  Final  Urine  Culture     Status: None   Collection Time: 05/14/20  2:33 PM   Specimen: Urine, Random  Result Value Ref Range Status   Specimen Description   Final    URINE, RANDOM Performed at [facility], [address] [place], [place] [address]    Special Requests   Final    NONE Performed at [facility], [address], [place], [place] [address]    Culture   Final    NO GROWTH Performed at [facility], [address] [place], [place] [address]    Report Status 05/15/2020 FINAL  Final          IMAGING    CT HEAD WO CONTRAST  Result Date: 05/22/2020 CLINICAL DATA:  34 year old male positive COVID-19 pneumonia. Neurologic deficit. EXAM: CT HEAD WITHOUT CONTRAST TECHNIQUE: Contiguous axial images were obtained from the base of the skull through the vertex without intravenous contrast. COMPARISON:  Head CT 05/16/2020. FINDINGS: Brain: No midline shift, ventriculomegaly, mass effect, evidence of mass lesion, intracranial hemorrhage or evidence of cortically based acute infarction. Gray-white matter differentiation is within normal limits throughout the brain. No convincing encephalomalacia. Vascular: No suspicious intracranial vascular hyperdensity. Skull: No acute osseous abnormality identified. Sinuses/Orbits: Improved paranasal sinus aeration, residual subtotal fluid opacification of the sphenoid sinuses. Bilateral mastoid fusions but tympanic cavity aeration appears improved. Other: Negative orbit and scalp soft tissues. IMPRESSION: 1. Stable, normal noncontrast CT appearance of the brain. 2. Improving paranasal sinus and middle ear pneumatization. Electronically Signed   By: Odessa Fleming M.D.   On: 05/22/2020 15:43     Nutrition Status: Nutrition Problem: Inadequate oral intake Etiology: inability to eat Signs/Symptoms: NPO status Interventions: Tube feeding, Prostat, MVI      ASSESSMENT AND PLAN SYNOPSIS  Acute hypoxemic respiratory failure due to  COVID-19 pneumonia / ARDS Wean off biPAP Oxygen as needed High risk for intubation Diamox/lasix as tolerated    Severe ACUTE Hypoxic and Hypercapnic Respiratory Failure Wean fio2  ACUTE DIASTOLIC CARDIAC FAILURE-  -oxygen as needed -Lasix as tolerated    Morbid obesity, possible OSA.   Will certainly impact respiratory mechanics   ACUTE KIDNEY INJURY/Renal Failure -continue Foley Catheter-assess need -Avoid nephrotoxic agents -Follow urine output, BMP     NEUROLOGY intermittent metabolic encephalopathy from COVID    CARDIAC ICU monitoring  ID -continue IV abx as prescibed -follow up cultures  GI GI  PROPHYLAXIS as indicated   DIET--> as tolerated Constipation protocol as indicated  ENDO - will use ICU hypoglycemic\Hyperglycemia protocol if indicated     ELECTROLYTES -follow labs as needed -replace as needed -pharmacy consultation and following   DVT/GI PRX ordered and assessed TRANSFUSIONS AS NEEDED MONITOR FSBS I Assessed the need for Labs I Assessed the need for Foley I Assessed the need for Central Venous Line Family Discussion when available I Assessed the need for Mobilization I made an Assessment of medications to be adjusted accordingly Safety Risk assessment completed   CASE DISCUSSED IN MULTIDISCIPLINARY ROUNDS WITH ICU TEAM  Critical Care Time devoted to patient care services described in this note is 35 minutes.   Overall, patient is critically ill, prognosis is guarded.     Lucie Leather, M.D.  [facility]  Medical Director [facility] Medical Director [facility] Cardio-Pulmonary Department

## 2020-05-23 NOTE — Progress Notes (Signed)
CSW spoke with RN and MD then called [name] to follow-up about PT's recommendation for LTAC. Explained LTAC. [name] reported she does not want patient to go anywhere. She reported she does not think patient needs to be in the hospital for months or years. Explained that LTAC does not mean patient will be in the hospital for that long. She reported she is frustrated by talking to multiple doctors and nurses during patient's hospital stay and said she has questions about patient's medical status. She reported she plans to visit today and wants to talk to someone about patient as well as patient's isolation as she wants to visit patient in his room. CSW relayed this to RN and MD.   Collin Ramus, LCSW [phone]

## 2020-05-24 DIAGNOSIS — J9601 Acute respiratory failure with hypoxia: Secondary | ICD-10-CM | POA: Diagnosis not present

## 2020-05-24 DIAGNOSIS — J1282 Pneumonia due to coronavirus disease 2019: Secondary | ICD-10-CM | POA: Diagnosis not present

## 2020-05-24 DIAGNOSIS — U071 COVID-19: Secondary | ICD-10-CM | POA: Diagnosis not present

## 2020-05-24 LAB — BASIC METABOLIC PANEL
Anion gap: 8 (ref 5–15)
BUN: 30 mg/dL — ABNORMAL HIGH (ref 6–20)
CO2: 33 mmol/L — ABNORMAL HIGH (ref 22–32)
Calcium: 8.7 mg/dL — ABNORMAL LOW (ref 8.9–10.3)
Chloride: 102 mmol/L (ref 98–111)
Creatinine, Ser: 0.65 mg/dL (ref 0.61–1.24)
GFR, Estimated: >60 mL/min (ref >60)
Glucose, Bld: 128 mg/dL — ABNORMAL HIGH (ref 70–99)
Potassium: 3.5 mmol/L (ref 3.5–5.1)
Sodium: 143 mmol/L (ref 135–145)

## 2020-05-24 LAB — CBC WITH DIFFERENTIAL/PLATELET
Abs Immature Granulocytes: 0.06 10*3/uL (ref 0.00–0.07)
Basophils Absolute: 0 10*3/uL (ref 0.0–0.1)
Basophils Relative: 0 %
Eosinophils Absolute: 0.3 10*3/uL (ref 0.0–0.5)
Eosinophils Relative: 3 %
HCT: 31.5 % — ABNORMAL LOW (ref 39.0–52.0)
Hemoglobin: 9.7 g/dL — ABNORMAL LOW (ref 13.0–17.0)
Immature Granulocytes: 1 %
Lymphocytes Relative: 17 %
Lymphs Abs: 1.4 10*3/uL (ref 0.7–4.0)
MCH: 28.3 pg (ref 26.0–34.0)
MCHC: 30.8 g/dL (ref 30.0–36.0)
MCV: 91.8 fL (ref 80.0–100.0)
Monocytes Absolute: 0.4 10*3/uL (ref 0.1–1.0)
Monocytes Relative: 5 %
Neutro Abs: 6 10*3/uL (ref 1.7–7.7)
Neutrophils Relative %: 74 %
Platelets: 241 10*3/uL (ref 150–400)
RBC: 3.43 MIL/uL — ABNORMAL LOW (ref 4.22–5.81)
RDW: 18.7 % — ABNORMAL HIGH (ref 11.5–15.5)
WBC: 8.1 10*3/uL (ref 4.0–10.5)
nRBC: 0.2 % (ref 0.0–0.2)

## 2020-05-24 LAB — GLUCOSE, CAPILLARY
Glucose-Capillary: 116 mg/dL — ABNORMAL HIGH (ref 70–99)
Glucose-Capillary: 125 mg/dL — ABNORMAL HIGH (ref 70–99)
Glucose-Capillary: 172 mg/dL — ABNORMAL HIGH (ref 70–99)
Glucose-Capillary: 173 mg/dL — ABNORMAL HIGH (ref 70–99)
Glucose-Capillary: 132 mg/dL — ABNORMAL HIGH (ref 70–99)
Glucose-Capillary: 115 mg/dL — ABNORMAL HIGH (ref 70–99)

## 2020-05-24 LAB — MAGNESIUM: Magnesium: 2.2 mg/dL (ref 1.7–2.4)

## 2020-05-24 LAB — PHOSPHORUS: Phosphorus: 4 mg/dL (ref 2.5–4.6)

## 2020-05-24 MED ORDER — FAMOTIDINE 20 MG PO TABS
20.0000 mg | ORAL_TABLET | Freq: Every day | ORAL | Status: DC
  Administered 2020-05-24 – 2020-05-29 (×6): 20 mg via ORAL
  Filled 2020-05-24 (×5): qty 1

## 2020-05-24 MED ORDER — INSULIN DETEMIR 100 UNIT/ML ~~LOC~~ SOLN
20.0000 [IU] | Freq: Two times a day (BID) | SUBCUTANEOUS | Status: DC
  Administered 2020-05-25 – 2020-06-04 (×21): 20 [IU] via SUBCUTANEOUS
  Filled 2020-05-24 (×23): qty 0.2

## 2020-05-24 MED ORDER — INSULIN DETEMIR 100 UNIT/ML ~~LOC~~ SOLN: 20.0000 [IU] | Freq: Two times a day (BID) | SUBCUTANEOUS | Status: DC

## 2020-05-24 NOTE — Progress Notes (Signed)
Physical Therapy Treatment Patient Details Name: Collin Brown MRN: [id number] DOB: 18-Oct-1985 Today's Date: 05/24/2020    History of Present Illness Collin Brown is a34yoM who comes to [facility] on 9/23 after progression of dyspnea. Pt recently (+)COVID19 along with other family. Pt required intubation on 9/25, self extubated on 10/10. At evaluation pt presenting on HHHF @55L /min, 90%FiO2.    PT Comments    Pt seen in morning for PT session. Pt continues to show very small improvements in voice, and limb strength. Pt has better focus this date, and improve neuromotor fatigue. Pt has AROM against gravity in Right elbow and wrist. BLE now consistently a 2-/5 to 2/5. Pt's LUE remains largely flaccid, today with twitch activation of hand grip, and wrist flexors. Pt also reports complete anesthesia of LUE mid brachium down, decreased sensation at left shoulder. Pt continues to work on RUE movements, and phonation long-tones. Pt left sitting up tall in bed at end of session, slight trendelenburg to maintain hip in position in bed. O2 settings this session: 50FiO2, 50L/min, 96% 115 bpm.     Follow Up Recommendations  Supervision for mobility/OOB;Supervision - Intermittent;LTACH     Equipment Recommendations       Recommendations for Other Services       Precautions / Restrictions Precautions Precautions: Fall Precaution Comments: significant deconditioning and weakness, struggling c limb control; ?left neglect Restrictions Weight Bearing Restrictions: No    Mobility  Bed Mobility               General bed mobility comments: +3 for pulling up in bed  Transfers                    Ambulation/Gait                 Stairs             Wheelchair Mobility    Modified Rankin (Stroke Patients Only)       Balance                                            Cognition Arousal/Alertness: Awake/alert Behavior During Therapy: WFL for tasks  assessed/performed Overall Cognitive Status: Within Functional Limits for tasks assessed                                        Exercises General Exercises - Lower Extremity Ankle Circles/Pumps: Supine;Both;15 reps;AAROM Short Arc Quad: AAROM;Both;15 reps;Supine Heel Slides: AAROM;Both;15 reps;Supine Hip ABduction/ADduction: AAROM;Both;15 reps;Supine Hip Flexion/Marching: Supine;15 reps;Both;AAROM Mini-Sqauts: Strengthening;Both;15 reps;Supine Other Exercises Other Exercises: AA/ROM Reverse Butterfly (hip ADD/IR) 1x15 bilat; manually resisted clamshell 1x15 bilat    General Comments        Pertinent Vitals/Pain Pain Assessment: No/denies pain    Home Living                      Prior Function            PT Goals (current goals can now be found in the care plan section) Acute Rehab PT Goals Patient Stated Goal: pt unable to verbalize PT Goal Formulation: Patient unable to participate in goal setting Time For Goal Achievement: 06/18/20 Potential to Achieve Goals: Fair Progress towards PT goals: Progressing toward goals  Frequency    Min 2X/week      PT Plan Current plan remains appropriate    Co-evaluation              AM-PAC PT "6 Clicks" Mobility   Outcome Measure  Help needed turning from your back to your side while in a flat bed without using bedrails?: Total Help needed moving from lying on your back to sitting on the side of a flat bed without using bedrails?: Total Help needed moving to and from a bed to a chair (including a wheelchair)?: Total Help needed standing up from a chair using your arms (e.g., wheelchair or bedside chair)?: Total Help needed to walk in hospital room?: Total Help needed climbing 3-5 steps with a railing? : Total 6 Click Score: 6    End of Session Equipment Utilized During Treatment: Oxygen Activity Tolerance: Patient tolerated treatment well;No increased pain Patient left: in bed Nurse  Communication: Mobility status PT Visit Diagnosis: Difficulty in walking, not elsewhere classified (R26.2);Other abnormalities of gait and mobility (R26.89);Muscle weakness (generalized) (M62.81)     Time: 9021-1155 PT Time Calculation (min) (ACUTE ONLY): 46 min  Charges:  $Therapeutic Exercise: 38-52 mins                     1:00 PM, 05/24/20 Rosamaria Lints, PT, DPT Physical Therapist - [facility]  [phone] (ASCOM)    Jasmine Maceachern C 05/24/2020, 12:52 PM

## 2020-05-24 NOTE — Progress Notes (Signed)
PHARMACY CONSULT NOTE  Pharmacy Consult for Electrolyte Monitoring and Replacement   Recent Labs: Potassium (mmol/L)  Date Value  05/24/2020 3.5   Magnesium (mg/dL)  Date Value  03/24/4817 2.2   Calcium (mg/dL)  Date Value  56/31/4970 8.7 (L)   Albumin (g/dL)  Date Value  26/37/8588 2.8 (L)   Phosphorus (mg/dL)  Date Value  50/27/7412 4.0   Sodium (mmol/L)  Date Value  05/24/2020 143   Corrected Calcium: 9.76 mg/dL   Assessment: Pharmacy has been consulted for electrolyte replacement in this 34 YOM requiring intubation and admission to the CCU for COVID PNA. Treatment including IV decadron, leading to the need for high amounts of insulin and eventually an insulin drip which has had to be restarted following several failed attempts at transitioning to subcutaneous insulin. He has been transitioned back to subcutaneous insulin. Patient self-extubated 10/10.  Lasix 80mg  IV BID discontinued 10/12  5% dextrose at 50 mL/hr discontinued 10/14 for improvement in sodium.  Goal of Therapy:  Electrolytes WNL  Plan:   Patient on dysphagia diet. Expect electrolytes to improve/stabilize as diet improves.  Sodium 143 today. MIVF discontinued.  Follow-up electrolytes in am  11/14, PharmD 05/24/2020 10:44 AM

## 2020-05-24 NOTE — Progress Notes (Signed)
CRITICAL CARE NOTE  34 yo with OSA, morbid obesity BMI>56 came in with flu like illness diagnosed with COVID19 pneumonia was initially ion Medical floor with hospitalist service initiated Remdesevir and barictitinib, he progressively became more dyspneic he was placed on BIPAP and did not tolerate then placed on HFNC did not tolerate patient himself consented to MV and was intubated in MICU. Mother was notified who is sick at home with COVID.   Events:  9/27- patient on 65%FiO2 with proning protocol. CVP trending, goal negative fluid balance.  05/08/20- patient with worsening ventilation increased FiO2 to 100% overnight and had episode of severe bradycardia. Concern for mucus plugging will perform recruitment maneuvers and possible bronchoscopy. We were able to perform tracheal aspirate and have weaned down fIO2 to 75% 05/09/20- patient had bradycardic episode and he desaturated overnight with temporary increase to 100FiO2. Lasix q12 going. I spoke to brother today for update. His tracheal aspirate came back normal flora so we will stop antibiotic.  05/10/20-patient with pneumomediastinum and on maximal setting on ventilator. Poor prognosis very unfortunate young man. I called and updated Mother today [name].  05/11/20- patient weaned to 80%. Will continue with weaning and proning protocol  05/12/20-patient remains critically ill. Mother at bedside we discussed his care plan, she is with strong faith and continues to pray for her son to improve.  05/13/20- patient remains crtically ill. Proning per protocol. No significant events overnight.  05/14/20- Continues to be febrile, FiO2 up to 100%, improved down to 60% after increasing PEEP from 10 to 16.  05/15/20- patient remains crtically ill, overall status unchanged 05/16/20-persistent fevers, CT head and chest, does have sinus opacification. 10/7 severe ARDS 10/8 febrile, severe ARDS 10/9 severe ARDS 10/10 self extubated, placed on  biPAP 10/11 on biPAP tolerating well, precedex 10/12 on biPAP last night 10/13 biPAP last night for OSA     CC  follow up respiratory failure  SUBJECTIVE slowly improving PT OT ST following Hoarse voice    BP (!) 151/83   Pulse (!) 107   Temp 100 F (37.8 C) (Axillary)   Resp (!) 27   Ht 5\' 10"  (1.778 m)   Wt (!) 171.6 kg   SpO2 96%   BMI 54.28 kg/m    I/O last 3 completed shifts: In: 3151.2 [P.O.:1312; I.V.:1689.2; IV Piggyback:149.9] Out: 300 [Urine:300] No intake/output data recorded.  SpO2: 96 % O2 Flow Rate (L/min): 50 L/min FiO2 (%): 65 %  Estimated body mass index is 54.28 kg/m as calculated from the following:   Height as of this encounter: 5\' 10"  (1.778 m).   Weight as of this encounter: 171.6 kg.  LIMITED ROS Alert and awake, no significant resp distress   Pressure Injury 05/12/20 Toe (Comment  which one) Anterior;Right Stage 2 -  Partial thickness loss of dermis presenting as a shallow open injury with a red, pink wound bed without slough. (Active)  05/12/20 1930  Location: Toe (Comment  which one)  Location Orientation: Anterior;Right  Staging: Stage 2 -  Partial thickness loss of dermis presenting as a shallow open injury with a red, pink wound bed without slough.  Wound Description (Comments):   Present on Admission: No     Pressure Injury 05/12/20 Toe (Comment  which one) Anterior;Left Stage 2 -  Partial thickness loss of dermis presenting as a shallow open injury with a red, pink wound bed without slough. (Active)  05/12/20 1930  Location: Toe (Comment  which one)  Location Orientation: Anterior;Left  Staging: Stage 2 -  Partial thickness loss of dermis presenting as a shallow open injury with a red, pink wound bed without slough.  Wound Description (Comments):   Present on Admission: No     Pressure Injury 05/21/20 Buttocks Right;Left purple/white (Active)  05/21/20 2123  Location: Buttocks  Location Orientation: Right;Left   Staging:   Wound Description (Comments): purple/white  Present on Admission:       PHYSICAL EXAMINATION:  GENERAL:alert and awake, NAD comfortable on biPAP for OSA NECK: Supple.  PULMONARY: +rhonchi,  CARDIOVASCULAR: S1 and S2. Regular rate and rhythm. No murmurs, rubs, or gallops.  GASTROINTESTINAL: Soft, nontender, -distended.  Positive bowel sounds.   MUSCULOSKELETAL: +edema.  NEUROLOGIC: alert and awake, generalized weakness SKIN:intact,warm,dry  MEDICATIONS: I have reviewed all medications and confirmed regimen as documented   CULTURE RESULTS   Recent Results (from the past 240 hour(s))  Culture, respiratory (non-expectorated)     Status: None   Collection Time: 05/14/20  8:58 AM   Specimen: Tracheal Aspirate; Respiratory  Result Value Ref Range Status   Specimen Description   Final    TRACHEAL ASPIRATE Performed at [facility], [address] [place], [place] [address]    Special Requests   Final    NONE Performed at [facility], [address] [place], [place] [address]    Gram Stain   Final    RARE WBC PRESENT,BOTH PMN AND MONONUCLEAR NO ORGANISMS SEEN    Culture   Final    RARE Normal respiratory flora-no Staph aureus or Pseudomonas seen Performed at [facility], [address] [place], [place] [address]    Report Status 05/16/2020 FINAL  Final  CULTURE, BLOOD (ROUTINE X 2) w Reflex to ID Panel     Status: None   Collection Time: 05/14/20  9:33 AM   Specimen: BLOOD LEFT HAND  Result Value Ref Range Status   Specimen Description BLOOD LEFT HAND  Final   Special Requests   Final    BOTTLES DRAWN AEROBIC AND ANAEROBIC Blood Culture adequate volume   Culture   Final    NO GROWTH 7 DAYS Performed at [facility], [address] [place], [place] [address]    Report Status 05/21/2020 FINAL  Final  CULTURE, BLOOD (ROUTINE X 2) w Reflex to ID Panel     Status: None   Collection Time: 05/14/20 10:17 AM   Specimen:  BLOOD LEFT HAND  Result Value Ref Range Status   Specimen Description BLOOD LEFT HAND  Final   Special Requests   Final    BOTTLES DRAWN AEROBIC AND ANAEROBIC Blood Culture adequate volume   Culture   Final    NO GROWTH 7 DAYS Performed at [facility], [address], [place], [place] [address]    Report Status 05/21/2020 FINAL  Final  Urine Culture     Status: None   Collection Time: 05/14/20  2:33 PM   Specimen: Urine, Random  Result Value Ref Range Status   Specimen Description   Final    URINE, RANDOM Performed at [facility], [address], [place], [place] [address]    Special Requests   Final    NONE Performed at [facility], [address] [place], [place] [address]    Culture   Final    NO GROWTH Performed at [facility], [address], [place], [place] [address]    Report Status 05/15/2020 FINAL  Final  IMAGING    No results found.   Nutrition Status: Nutrition Problem: Inadequate oral intake Etiology: inability to eat Signs/Symptoms: NPO status Interventions: Tube feeding, Prostat, MVI      ASSESSMENT AND PLAN SYNOPSIS   Severe ACUTE Hypoxic and Hypercapnic Respiratory Failure COVID pneumonia slowly resolving PT OT, ST following   ACUTE DIASTOLIC CARDIAC FAILURE- -oxygen as needed -Lasix as tolerated    Morbid obesity, possible OSA.   Will certainly impact respiratory mechanics  NEUROLOGY Generalized weakness critical illness neuropathy Aggressive PT OT needed   CARDIAC ICU monitoring   GI GI PROPHYLAXIS as indicated   DIET-- as tolerated Constipation protocol as indicated  ENDO - will use ICU hypoglycemic\Hyperglycemia protocol if indicated     ELECTROLYTES -follow labs as needed -replace as needed -pharmacy consultation and following   DVT/GI PRX ordered and assessed TRANSFUSIONS AS NEEDED MONITOR FSBS I Assessed the need for Labs I Assessed the need for  Foley I Assessed the need for Central Venous Line Family Discussion when available I Assessed the need for Mobilization I made an Assessment of medications to be adjusted accordingly Safety Risk assessment completed   CASE DISCUSSED IN MULTIDISCIPLINARY ROUNDS WITH ICU TEAM  TRANSFER TO [facility] SERVICE 10/15   Lucie Leather, M.D.  [facility]  Medical Director [facility] Medical Director [facility] Cardio-Pulmonary Department

## 2020-05-24 NOTE — Progress Notes (Signed)
Occupational Therapy Treatment Patient Details Name: Collin Brown MRN: [id number] DOB: 09-Jun-1986 Today's Date: 05/24/2020    History of present illness Collin Brown is a34yoM who comes to [facility] on 9/23 after progression of dyspnea. Pt recently (+)COVID19 along with other family. Pt required intubation on 9/25, self extubated on 10/10. At evaluation pt presenting on HHHF @55L /min, 90%FiO2.   OT comments  Pt seen for OT tx this date. Pt appeared more alert and with improved non-verbal communication this date. Appropriately responds to all orientation questions, decreased processing time noted. Pt denies pain but endorses discomfort with BLE positioning, improved with repositioning and pillows to maintain new position. Pt tolerated washing his face with set up and Max A to complete, requiring 2 brief rest breaks 2/2 significant RUE fatigue. Pt demo's ability to visually track therapist L<>R. Pt nods when asked if he knows where his LUE is in space and whether it feels detached from his body. Pt demo's impaired sensation and proprioception, only able to slightly endorse feeling noxious stimuli LUE. BUE AA/ROM elbow flex, vertical punches Max A for RUE, Total A for LUE. Was able to visually attend to LUE with cues. Trace finger flex/ext. BLE repositioning for improved BLE comfort and skin/joint protection. Pt provided with modified call bell to improve safety/ease of access for help. RN notified. Pt continues to benefit from skilled OT services. Continue to recommend SNF vs LTACH pending progress made while hospitalized. 50FiO2, 50L/min, 96%, HR 110's.   Follow Up Recommendations  SNF;LTACH    Equipment Recommendations  Other (comment) (TBD at next venue of care)    Recommendations for Other Services      Precautions / Restrictions Precautions Precautions: Fall Precaution Comments: significant deconditioning and weakness, struggling c limb control; ?left neglect Restrictions Weight Bearing  Restrictions: No       Mobility Bed Mobility               General bed mobility comments: +3 for pulling up in bed  Transfers                      Balance                                           ADL either performed or assessed with clinical judgement   ADL Overall ADL's : Needs assistance/impaired     Grooming: Wash/dry face;Bed level;Maximal assistance Grooming Details (indicate cue type and reason): max A to perform requiring rest breaks 2/2 significant fatigue                                     Vision   Additional Comments: Pt denied double vision, blurry vision   Perception     Praxis      Cognition Arousal/Alertness: Awake/alert Behavior During Therapy: WFL for tasks assessed/performed;Flat affect Overall Cognitive Status: Within Functional Limits for tasks assessed                                 General Comments: alert and oriented, correctly responses to yes/no questions by nodding/shaking head, still unable to vocalize coherently        Exercises  Other Exercises Other Exercises: BUE AA/ROM elbow flex, vertical punches Max  A for RUE, Total A for LUE Other Exercises: repositioning for improved BLE comfort and skin/joint protection    Shoulder Instructions       General Comments      Pertinent Vitals/ Pain       Pain Assessment: No/denies pain  Home Living                                          Prior Functioning/Environment              Frequency  Min 2X/week        Progress Toward Goals  OT Goals(current goals can now be found in the care plan section)  Progress towards OT goals: Progressing toward goals  Acute Rehab OT Goals Patient Stated Goal: pt unable to verbalize OT Goal Formulation: Patient unable to participate in goal setting Time For Goal Achievement: 06/04/20 Potential to Achieve Goals: Fair  Plan Discharge plan remains  appropriate;Frequency remains appropriate    Co-evaluation                 AM-PAC OT "6 Clicks" Daily Activity     Outcome Measure   Help from another person eating meals?: A Lot Help from another person taking care of personal grooming?: A Lot Help from another person toileting, which includes using toliet, bedpan, or urinal?: Total Help from another person bathing (including washing, rinsing, drying)?: Total Help from another person to put on and taking off regular upper body clothing?: A Lot Help from another person to put on and taking off regular lower body clothing?: Total 6 Click Score: 9    End of Session    OT Visit Diagnosis: Other abnormalities of gait and mobility (R26.89);Muscle weakness (generalized) (M62.81);Other symptoms and signs involving cognitive function   Activity Tolerance Patient tolerated treatment well   Patient Left in bed;with call bell/phone within reach;with bed alarm set (replaced button call bell with pancake call bell for easier access)   Nurse Communication Other (comment) (replaced call bell)        Time: 1415-1440 OT Time Calculation (min): 25 min  Charges: OT General Charges $OT Visit: 1 Visit OT Treatments $Self Care/Home Management : 8-22 mins $Therapeutic Exercise: 8-22 mins  Richrd Prime, MPH, MS, OTR/L ascom [phone] 05/24/20, 4:21 PM

## 2020-05-25 ENCOUNTER — Inpatient Hospital Stay: Payer: BC Managed Care – PPO

## 2020-05-25 LAB — GLUCOSE, CAPILLARY
Glucose-Capillary: 123 mg/dL — ABNORMAL HIGH (ref 70–99)
Glucose-Capillary: 122 mg/dL — ABNORMAL HIGH (ref 70–99)
Glucose-Capillary: 184 mg/dL — ABNORMAL HIGH (ref 70–99)
Glucose-Capillary: 156 mg/dL — ABNORMAL HIGH (ref 70–99)
Glucose-Capillary: 136 mg/dL — ABNORMAL HIGH (ref 70–99)
Glucose-Capillary: 129 mg/dL — ABNORMAL HIGH (ref 70–99)

## 2020-05-25 LAB — CBC WITH DIFFERENTIAL/PLATELET
Abs Immature Granulocytes: 0.1 10*3/uL — ABNORMAL HIGH (ref 0.00–0.07)
Basophils Absolute: 0 10*3/uL (ref 0.0–0.1)
Basophils Relative: 0 %
Eosinophils Absolute: 0.3 10*3/uL (ref 0.0–0.5)
Eosinophils Relative: 4 %
HCT: 32.1 % — ABNORMAL LOW (ref 39.0–52.0)
Hemoglobin: 9.7 g/dL — ABNORMAL LOW (ref 13.0–17.0)
Immature Granulocytes: 1 %
Lymphocytes Relative: 18 %
Lymphs Abs: 1.5 10*3/uL (ref 0.7–4.0)
MCH: 27.8 pg (ref 26.0–34.0)
MCHC: 30.2 g/dL (ref 30.0–36.0)
MCV: 92 fL (ref 80.0–100.0)
Monocytes Absolute: 0.5 10*3/uL (ref 0.1–1.0)
Monocytes Relative: 6 %
Neutro Abs: 5.7 10*3/uL (ref 1.7–7.7)
Neutrophils Relative %: 71 %
Platelets: 248 10*3/uL (ref 150–400)
RBC: 3.49 MIL/uL — ABNORMAL LOW (ref 4.22–5.81)
RDW: 18.4 % — ABNORMAL HIGH (ref 11.5–15.5)
WBC: 8 10*3/uL (ref 4.0–10.5)
nRBC: 0.3 % — ABNORMAL HIGH (ref 0.0–0.2)

## 2020-05-25 LAB — BASIC METABOLIC PANEL
Anion gap: 13 (ref 5–15)
BUN: 26 mg/dL — ABNORMAL HIGH (ref 6–20)
CO2: 28 mmol/L (ref 22–32)
Calcium: 8.6 mg/dL — ABNORMAL LOW (ref 8.9–10.3)
Chloride: 103 mmol/L (ref 98–111)
Creatinine, Ser: 0.54 mg/dL — ABNORMAL LOW (ref 0.61–1.24)
GFR, Estimated: >60 mL/min (ref >60)
Glucose, Bld: 130 mg/dL — ABNORMAL HIGH (ref 70–99)
Potassium: 3.6 mmol/L (ref 3.5–5.1)
Sodium: 144 mmol/L (ref 135–145)

## 2020-05-25 LAB — PHOSPHORUS: Phosphorus: 3.8 mg/dL (ref 2.5–4.6)

## 2020-05-25 LAB — MAGNESIUM: Magnesium: 2.1 mg/dL (ref 1.7–2.4)

## 2020-05-25 IMAGING — DX DG CHEST 1V PORT
2 series · 2 of 2 positions shown · non-contrast
Comparison: [DATE].

CLINICAL DATA: [B8].

EXAM:
PORTABLE CHEST 1 VIEW

[chest ap (1 of 2)]
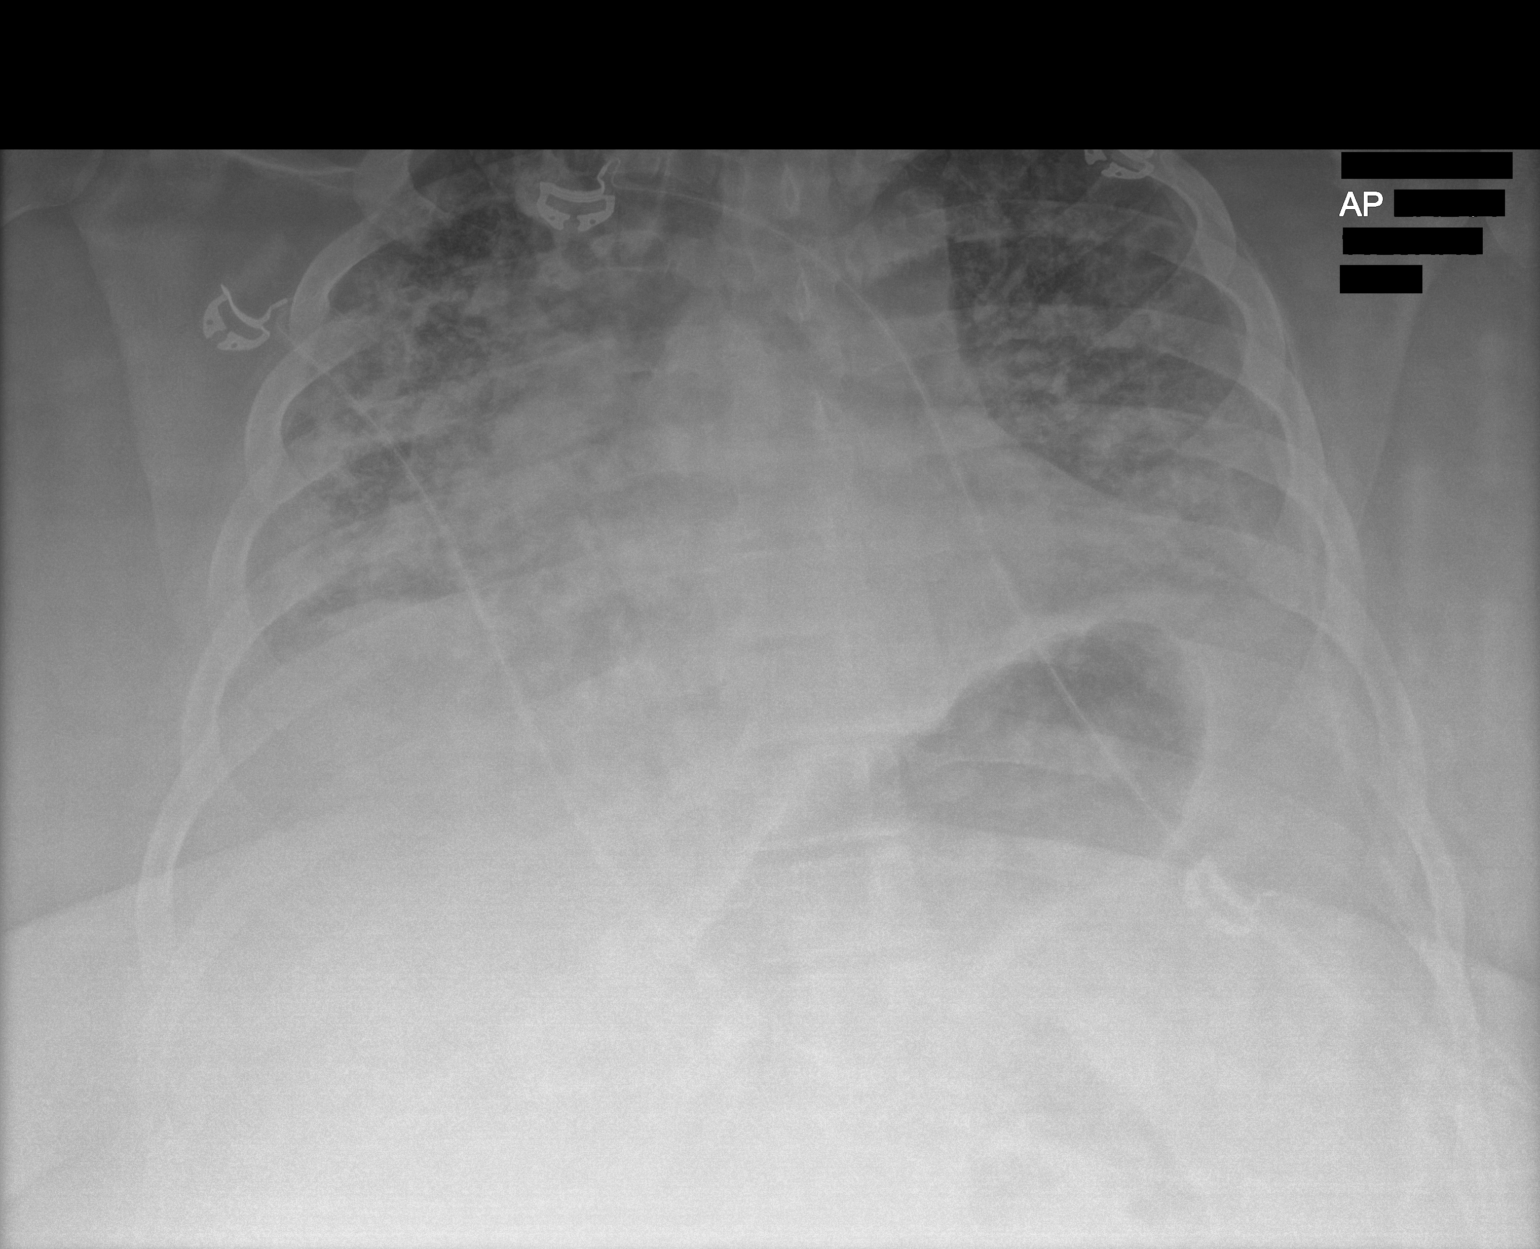

[chest ap (2 of 2)]
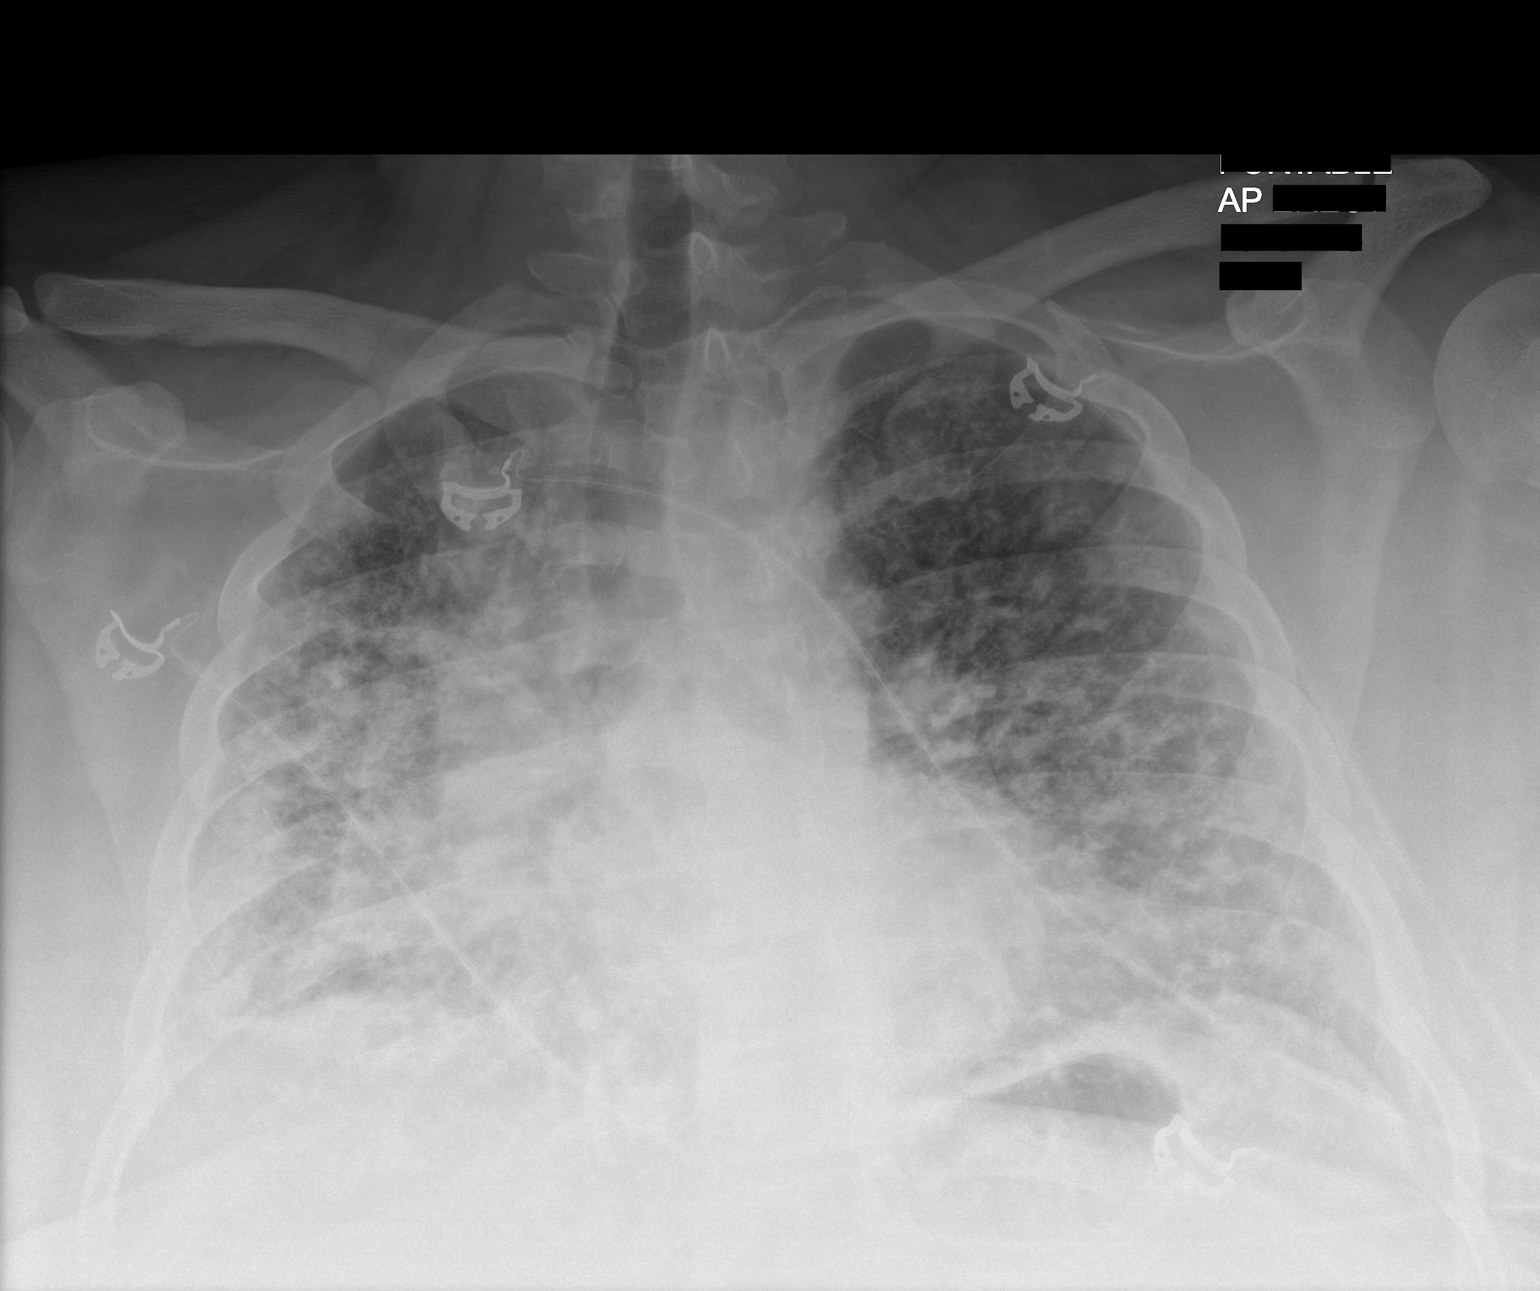

[2 of 2 positions shown; findings below may reference images not displayed]

FINDINGS: Stable cardiomediastinal silhouette. Endotracheal and nasogastric
tubes have been removed. Hypoinflation of the lungs is noted.
Increased bilateral patchy airspace opacities are noted concerning
for multifocal pneumonia. No pneumothorax or pleural effusion is
noted. Bony thorax is unremarkable.
IMPRESSION: Increased bilateral patchy airspace opacities are noted concerning
for worsening multifocal pneumonia. Endotracheal and nasogastric
tubes have been removed.

## 2020-05-25 NOTE — Progress Notes (Signed)
Per rounds, patient still an LTAC candidate. CSW asked Dr. Karna Christmas to talk to family about LTAC. Per [employer], patient would qualify if patient and family are agreeable.  Alfonso Ramus, [place] [phone]

## 2020-05-25 NOTE — Progress Notes (Signed)
CRITICAL CARE PROGRESS NOTE    Name: Collin Brown MRN: [id number] DOB: 05-20-1986     LOS: 22   SUBJECTIVE FINDINGS & SIGNIFICANT EVENTS    Patient description:  34 yo with OSA, morbid obesity BMI>56 came in with flu like illness diagnosed with COVID19 pneumonia was initially ion Medical floor with hospitalist service initiated Remdesevir and barictitinib, he progressively became more dyspneic he was placed on BIPAP and did not tolerate then placed on HFNC did not tolerate patient himself consented to MV and was intubated in MICU. Mother was notified who is sick at home with COVID.   Events:  9/27- patient on 65%FiO2 with proning protocol. CVP trending, goal negative fluid balance.   05/08/20- patient with worsening ventilation increased FiO2 to 100% overnight and had episode of severe bradycardia.  Concern for mucus plugging will perform recruitment maneuvers and possible bronchoscopy.  We were able to perform tracheal aspirate and have weaned down fIO2 to 75% 05/09/20- patient had bradycardic episode and he desaturated overnight with temporary increase to 100FiO2.  Lasix q12 going. I spoke to brother today for update.  His tracheal aspirate came back normal flora so we will stop antibiotic.  05/10/20-patient with pneumomediastinum and on maximal setting on ventilator. Poor prognosis very unfortunate young man. I called and updated Mother today Collin Brown.  05/11/20- patient weaned to 80%. Will continue with weaning and proning protocol  05/12/20- patient remains critically ill.  Mother at bedside we discussed his care plan, she is with strong faith and continues to pray for her son to improve.  05/13/20- patient remains crtically ill.  Proning per protocol. No significant events overnight.  05/14/20- Continues to be  febrile, FiO2 up to 100%, improved down to 60% after increasing PEEP from 10 to 16.  05/15/20- patient remains crtically ill, overall status unchanged 05/16/20-persistent fevers, CT head and chest, does have sinus opacification. 10/7 severe ARDS 10/8 febrile, severe ARDS 10/9 severe ARDS 10/10 self extubated, placed on biPAP 10/11 on biPAP tolerating well, precedex 10/12 on biPAP last night 10/13 biPAP last night for OSA 05/25/20- patient is awake and alert, he is able to respond to verbal communication but is with encephalopathy and slow to communicate.  Weaned HFNC down today per RT. Signing out to Collin Brown for am pick up.    Lines/tubes  9/25 - Left IJ placed  : Airway 8 mm (Active)  Secured at (cm) 26 cm 05/07/20 0801  Measured From Lips 05/07/20 0801  Secured Location Center 05/07/20 0801  Secured By [employer] Tube Holder 05/07/20 0801  Tube Holder Repositioned Yes 05/07/20 0801  Cuff Pressure (cm H2O) 26 cm H2O 05/07/20 0801  Site Condition Dry 05/07/20 0801     CVC Triple Lumen 05/05/20 Left Internal jugular (Active)  Indication for Insertion or Continuance of Line Vasoactive infusions;Poor Vasculature-patient has had multiple peripheral attempts or PIVs lasting less than 24 hours 05/06/20 2130  Site Assessment Clean;Dry;Intact 05/06/20 2130  Proximal Lumen Status Infusing 05/06/20 2130  Medial Lumen Status Infusing 05/06/20 2130  Distal Lumen Status Infusing 05/06/20 2130  Dressing Type Transparent;Occlusive 05/06/20 2130  Dressing Status Clean;Dry;Intact 05/06/20 2130  Antimicrobial disc in place? Yes 05/06/20 2130  Line Care Connections checked and tightened 05/06/20 2130  Dressing Intervention Dressing reinforced 05/06/20 0900  Dressing Change Due 05/12/20 05/06/20 2130     NG/OG Tube Orogastric Center mouth Xray 65 cm (Active)  Cm Marking at Nare/Corner of Mouth (if applicable) 65 cm 05/06/20 2130  Site Assessment Clean;Dry;Intact  05/06/20 2130  Ongoing Placement  Verification No change in cm markings or external length of tube from initial placement;No change in respiratory status;No acute changes, not attributed to clinical condition;Xray 05/06/20 2130  Status Clamped 05/06/20 2130     Urethral Catheter Waynard Edwards, RN 16 Fr. (Active)  Indication for Insertion or Continuance of Catheter Unstable critically ill patients first 24-48 hours (See Criteria) 05/06/20 2000  Site Assessment Clean;Intact;Prolapse;Swelling 05/06/20 2000  Catheter Maintenance Bag below level of bladder;Catheter secured;Drainage bag/tubing not touching floor;Insertion date on drainage bag;No dependent loops;Seal intact 05/06/20 2000  Collection Container Standard drainage bag 05/06/20 2000  Securement Method Securing device (Describe) 05/06/20 2000  Urinary Catheter Interventions (if applicable) Unclamped 05/06/20 2000  Output (mL) 100 mL 05/07/20 0445    Microbiology/Sepsis markers: Results for orders placed or performed during the hospital encounter of 05/03/20  Blood Culture (routine x 2)     Status: None   Collection Time: 05/03/20  4:33 PM   Specimen: BLOOD  Result Value Ref Range Status   Specimen Description BLOOD BLOOD LEFT HAND  Final   Special Requests   Final    BOTTLES DRAWN AEROBIC AND ANAEROBIC Blood Culture results may not be optimal due to an inadequate volume of blood received in culture bottles   Culture   Final    NO GROWTH 5 DAYS Performed at [facility], [address], [place], [place] [address]    Report Status 05/08/2020 FINAL  Final  Blood Culture (routine x 2)     Status: None   Collection Time: 05/03/20  4:33 PM   Specimen: BLOOD  Result Value Ref Range Status   Specimen Description BLOOD BLOOD RIGHT HAND  Final   Special Requests   Final    BOTTLES DRAWN AEROBIC AND ANAEROBIC Blood Culture adequate volume   Culture   Final    NO GROWTH 5 DAYS Performed at [facility], [address] [place], [place] [address]    Report  Status 05/08/2020 FINAL  Final  Respiratory Panel by RT PCR (Flu A&B, Covid) -     Status: Abnormal   Collection Time: 05/03/20  7:32 PM  Result Value Ref Range Status   SARS Coronavirus 2 by RT PCR POSITIVE (A) NEGATIVE Final    Comment: RESULT CALLED TO, READ BACK BY AND VERIFIED WITH: Benson Norway RN 2114 05/03/20 HNM/AR    Influenza A by PCR NEGATIVE NEGATIVE Final   Influenza B by PCR NEGATIVE NEGATIVE Final    Comment: Performed at [facility], [address] [place], [place] [address]  MRSA PCR Screening     Status: None   Collection Time: 05/06/20 11:51 AM   Specimen: Nasal Mucosa; Nasopharyngeal  Result Value Ref Range Status   MRSA by PCR NEGATIVE NEGATIVE Final    Comment:        The GeneXpert MRSA Assay (FDA approved for NASAL specimens only), is one component of a comprehensive MRSA colonization surveillance program. It is not intended to diagnose MRSA infection nor to guide or monitor treatment for MRSA infections. Performed at [facility], [address] [place], [place] [address]   Culture, respiratory (non-expectorated)     Status: None   Collection Time: 05/07/20 10:55 AM   Specimen: Tracheal Aspirate; Respiratory  Result Value Ref Range Status   Specimen Description   Final    TRACHEAL ASPIRATE Performed at [facility], [address], [place], [place] [address]    Special Requests  Final    NONE Performed at [facility], [address] [place], [place] [address]    Gram Stain   Final    RARE WBC PRESENT,BOTH PMN AND MONONUCLEAR NO ORGANISMS SEEN    Culture   Final    RARE Normal respiratory flora-no Staph aureus or Pseudomonas seen Performed at [facility], [address] [place], [place] [address]    Report Status 05/09/2020 FINAL  Final  CULTURE, BLOOD (ROUTINE X 2) w Reflex to ID Panel     Status: None   Collection Time: 05/10/20 12:47 PM   Specimen: BLOOD  Result Value Ref Range Status    Specimen Description BLOOD BLOOD RIGHT HAND  Final   Special Requests   Final    BOTTLES DRAWN AEROBIC AND ANAEROBIC Blood Culture adequate volume   Culture   Final    NO GROWTH 5 DAYS Performed at [facility], [address] [place], [place] [address]    Report Status 05/15/2020 FINAL  Final  CULTURE, BLOOD (ROUTINE X 2) w Reflex to ID Panel     Status: None   Collection Time: 05/10/20  1:21 PM   Specimen: BLOOD  Result Value Ref Range Status   Specimen Description BLOOD BLOOD RIGHT HAND  Final   Special Requests   Final    BOTTLES DRAWN AEROBIC AND ANAEROBIC Blood Culture adequate volume   Culture   Final    NO GROWTH 5 DAYS Performed at [facility], [address] [place], [place] [address]    Report Status 05/15/2020 FINAL  Final  Culture, respiratory (non-expectorated)     Status: None   Collection Time: 05/14/20  8:58 AM   Specimen: Tracheal Aspirate; Respiratory  Result Value Ref Range Status   Specimen Description   Final    TRACHEAL ASPIRATE Performed at [facility], [address] [place], [place] [address]    Special Requests   Final    NONE Performed at [facility], [address] [place], [place] [address]    Gram Stain   Final    RARE WBC PRESENT,BOTH PMN AND MONONUCLEAR NO ORGANISMS SEEN    Culture   Final    RARE Normal respiratory flora-no Staph aureus or Pseudomonas seen Performed at [facility], [address] [place], [place] [address]    Report Status 05/16/2020 FINAL  Final  CULTURE, BLOOD (ROUTINE X 2) w Reflex to ID Panel     Status: None   Collection Time: 05/14/20  9:33 AM   Specimen: BLOOD LEFT HAND  Result Value Ref Range Status   Specimen Description BLOOD LEFT HAND  Final   Special Requests   Final    BOTTLES DRAWN AEROBIC AND ANAEROBIC Blood Culture adequate volume   Culture   Final    NO GROWTH 7 DAYS Performed at [facility], [address] [place], [place]  [address]    Report Status 05/21/2020 FINAL  Final  CULTURE, BLOOD (ROUTINE X 2) w Reflex to ID Panel     Status: None   Collection Time: 05/14/20 10:17 AM   Specimen: BLOOD LEFT HAND  Result Value Ref Range Status   Specimen Description BLOOD LEFT HAND  Final   Special Requests   Final    BOTTLES DRAWN AEROBIC AND ANAEROBIC Blood Culture adequate volume   Culture   Final    NO GROWTH 7 DAYS Performed at [facility], [address] [place],  [place] [address]    Report Status 05/21/2020 FINAL  Final  Urine Culture     Status: None   Collection Time: 05/14/20  2:33 PM   Specimen: Urine, Random  Result Value Ref Range Status   Specimen Description   Final    URINE, RANDOM Performed at [facility], [address], [place], [place] [address]    Special Requests   Final    NONE Performed at [facility], [address], [place], [place] [address]    Culture   Final    NO GROWTH Performed at [facility], [address] [place], [place] [address]    Report Status 05/15/2020 FINAL  Final    Anti-infectives:  Anti-infectives (From admission, onward)   Start     Dose/Rate Route Frequency Ordered Stop   05/16/20 1800  Ampicillin-Sulbactam (UNASYN) 3 g in sodium chloride 0.9 % 100 mL IVPB  Status:  Discontinued        3 g 200 mL/hr over 30 Minutes Intravenous Every 6 hours 05/16/20 1458 05/22/20 1008   05/14/20 0600  ceFEPIme (MAXIPIME) 2 g in sodium chloride 0.9 % 100 mL IVPB  Status:  Discontinued        2 g 200 mL/hr over 30 Minutes Intravenous Every 8 hours 05/14/20 0533 05/16/20 1458   05/07/20 1200  meropenem (MERREM) 1 g in sodium chloride 0.9 % 100 mL IVPB  Status:  Discontinued        1 g 200 mL/hr over 30 Minutes Intravenous Every 8 hours 05/07/20 1020 05/09/20 1019   05/06/20 1200  vancomycin (VANCOCIN) IVPB 1000 mg/200 mL premix  Status:  Discontinued        1,000 mg 200 mL/hr over 60 Minutes Intravenous Every 8 hours 05/05/20 2330  05/07/20 1020   05/06/20 0200  vancomycin (VANCOREADY) IVPB 500 mg/100 mL       "Followed by" Linked Group Details   500 mg 100 mL/hr over 60 Minutes Intravenous  Once 05/05/20 2330 05/06/20 0454   05/06/20 0000  vancomycin (VANCOREADY) IVPB 2000 mg/400 mL       "Followed by" Linked Group Details   2,000 mg 200 mL/hr over 120 Minutes Intravenous  Once 05/05/20 2330 05/06/20 0304   05/05/20 2315  cefTRIAXone (ROCEPHIN) 2 g in sodium chloride 0.9 % 100 mL IVPB  Status:  Discontinued        2 g 200 mL/hr over 30 Minutes Intravenous Daily at 10 pm 05/05/20 2307 05/07/20 1020   05/04/20 1000  remdesivir 100 mg in sodium chloride 0.9 % 100 mL IVPB       "Followed by" Linked Group Details   100 mg 200 mL/hr over 30 Minutes Intravenous Daily 05/03/20 1718 05/07/20 0941   05/03/20 1800  remdesivir 200 mg in sodium chloride 0.9% 250 mL IVPB       "Followed by" Linked Group Details   200 mg 580 mL/hr over 30 Minutes Intravenous Once 05/03/20 1718 05/03/20 1941       Consults: Treatment Team:  Pccm, Raymond Gurney, MD     PAST MEDICAL HISTORY   Past Medical History:  Diagnosis Date  . Hearing loss in right ear   . Sleep apnea      SURGICAL HISTORY   Past Surgical History:  Procedure Laterality Date  . TONSILLECTOMY       FAMILY HISTORY   Family History  Problem Relation Age of Onset  . Diabetes Mellitus II Mother   . Hypertension  Mother   . Diabetes Mellitus II Father   . High blood pressure Father      SOCIAL HISTORY   Social History   Tobacco Use  . Smoking status: Never Smoker  . Smokeless tobacco: Never Used  Vaping Use  . Vaping Use: Never used  Substance Use Topics  . Alcohol use: No  . Drug use: No     MEDICATIONS   Current Medication:  Current Facility-Administered Medications:  .  0.9 %  sodium chloride infusion, , Intravenous, PRN, Staci Acosta, MD, Stopped at 05/22/20 1610 .  acetaminophen (TYLENOL) tablet 1,000 mg, 1,000 mg, Oral, Q6H  PRN, Manuela Schwartz, NP, 1,000 mg at 05/19/20 0903 .  ascorbic acid (VITAMIN C) tablet 500 mg, 500 mg, Oral, Daily, Kasa, Kurian, MD, 500 mg at 05/24/20 0848 .  aspirin chewable tablet 81 mg, 81 mg, Oral, Daily, Kasa, Kurian, MD, 81 mg at 05/24/20 0849 .  chlorhexidine (PERIDEX) 0.12 % solution 15 mL, 15 mL, Mouth Rinse, BID, Kasa, Kurian, MD, 15 mL at 05/24/20 2257 .  Chlorhexidine Gluconate Cloth 2 % PADS 6 each, 6 each, Topical, Daily, Salena Saner, MD, 6 each at 05/24/20 1737 .  dextrose 50 % solution 0-50 mL, 0-50 mL, Intravenous, PRN, Karna Christmas, Jerry Haugen, MD .  enoxaparin (LOVENOX) injection 40 mg, 40 mg, Subcutaneous, BID, Irena Cords V, MD, 40 mg at 05/24/20 2257 .  famotidine (PEPCID) tablet 20 mg, 20 mg, Oral, Daily, Kasa, Kurian, MD, 20 mg at 05/24/20 1047 .  feeding supplement (NEPRO CARB STEADY) liquid 237 mL, 237 mL, Oral, TID BM, Erin Fulling, MD, 237 mL at 05/24/20 2032 .  ibuprofen (ADVIL) 100 MG/5ML suspension 600 mg, 600 mg, Oral, Q8H PRN, Kasa, Kurian, MD .  insulin aspart (novoLOG) injection 0-20 Units, 0-20 Units, Subcutaneous, Q4H, Lowella Bandy, RPH, 3 Units at 05/25/20 0350 .  insulin detemir (LEVEMIR) injection 20 Units, 20 Units, Subcutaneous, BID, Harlon Ditty D, NP .  MEDLINE mouth rinse, 15 mL, Mouth Rinse, q12n4p, Kasa, Kurian, MD, 15 mL at 05/24/20 1734 .  metoprolol tartrate (LOPRESSOR) injection 2.5-5 mg, 2.5-5 mg, Intravenous, Q3H PRN, Bowser, Grace E, NP, 2.5 mg at 05/21/20 2235 .  multivitamin with minerals tablet 1 tablet, 1 tablet, Oral, Daily, Erin Fulling, MD, 1 tablet at 05/24/20 0908 .  polyethylene glycol (MIRALAX / GLYCOLAX) packet 17 g, 17 g, Oral, Daily, Kasa, Kurian, MD, 17 g at 05/24/20 1000 .  sodium chloride flush (NS) 0.9 % injection 10-40 mL, 10-40 mL, Intracatheter, Q12H, Kasa, Kurian, MD, 10 mL at 05/24/20 2258 .  sodium chloride flush (NS) 0.9 % injection 10-40 mL, 10-40 mL, Intracatheter, PRN, Belia Heman, Kurian, MD .  zinc sulfate capsule  220 mg, 220 mg, Oral, Daily, Kasa, Kurian, MD, 220 mg at 05/24/20 0907    ALLERGIES   Patient has no known allergies.    REVIEW OF SYSTEMS    10 point ROS unable to perform due to sedation on MV  PHYSICAL EXAMINATION   Vital Signs: Temp:  [98.5 F (36.9 C)-99.1 F (37.3 C)] 99.1 F (37.3 C) (10/15 0200) Pulse Rate:  [57-115] 110 (10/15 0600) Resp:  [17-31] 26 (10/15 0600) BP: (131-173)/(68-100) 173/86 (10/15 0600) SpO2:  [77 %-98 %] 97 % (10/15 0600) FiO2 (%):  [45 %] 45 % (10/14 2048) Weight:  [171.2 kg] 171.2 kg (10/15 0500)  GENERAL:NAD HEAD: Normocephalic, atraumatic.  EYES: Pupils equal, round, reactive to light.  No scleral icterus.  MOUTH: Moist mucosal membrane. NECK: Supple. No thyromegaly.  No nodules. No JVD.  PULMONARY: rhonchi bilaterally  CARDIOVASCULAR: S1 and S2. Regular rate and rhythm. No murmurs, rubs, or gallops.  GASTROINTESTINAL: Soft, nontender, non-distended. No masses. Positive bowel sounds. No hepatosplenomegaly.  MUSCULOSKELETAL: No swelling, clubbing, or edema.  NEUROLOGIC: Mild distress due to acute illness, +confusion  SKIN:intact,warm,dry   PERTINENT DATA     Infusions: . sodium chloride Stopped (05/22/20 6948)   Scheduled Medications: . vitamin C  500 mg Oral Daily  . aspirin  81 mg Oral Daily  . chlorhexidine  15 mL Mouth Rinse BID  . Chlorhexidine Gluconate Cloth  6 each Topical Daily  . enoxaparin (LOVENOX) injection  40 mg Subcutaneous BID  . famotidine  20 mg Oral Daily  . feeding supplement (NEPRO CARB STEADY)  237 mL Oral TID BM  . insulin aspart  0-20 Units Subcutaneous Q4H  . insulin detemir  20 Units Subcutaneous BID  . mouth rinse  15 mL Mouth Rinse q12n4p  . multivitamin with minerals  1 tablet Oral Daily  . polyethylene glycol  17 g Oral Daily  . sodium chloride flush  10-40 mL Intracatheter Q12H  . zinc sulfate  220 mg Oral Daily   PRN Medications: sodium chloride, acetaminophen, dextrose, ibuprofen,  metoprolol tartrate, sodium chloride flush Hemodynamic parameters:   Intake/Output: 10/14 0701 - 10/15 0700 In: 410 [P.O.:400; I.V.:10] Out: 950 [Urine:950]  Ventilator  Settings: FiO2 (%):  [45 %] 45 %   Other Labs:     LAB RESULTS:  Basic Metabolic Panel: Recent Labs  Lab 05/21/20 0303 05/21/20 0303 05/22/20 0525 05/22/20 0525 05/23/20 0615 05/23/20 0615 05/24/20 0535 05/25/20 0345  NA 153*  --  148*  --  149*  --  143 144  K 3.4*   < > 3.2*   < > 3.5   < > 3.5 3.6  CL 108  --  104  --  104  --  102 103  CO2 32  --  35*  --  36*  --  33* 28  GLUCOSE 93  --  129*  --  133*  --  128* 130*  BUN 52*  --  45*  --  41*  --  30* 26*  CREATININE 0.81  --  0.69  --  0.75  --  0.65 0.54*  CALCIUM 8.8*  --  9.0  --  8.9  --  8.7* 8.6*  MG 2.5*  --  2.4  --  2.3  --  2.2 2.1  PHOS 4.3  --  4.2  --  4.1  --  4.0 3.8   < > = values in this interval not displayed.   Liver Function Tests: No results for input(s): AST, ALT, ALKPHOS, BILITOT, PROT, ALBUMIN in the last 168 hours. No results for input(s): LIPASE, AMYLASE in the last 168 hours. No results for input(s): AMMONIA in the last 168 hours. CBC: Recent Labs  Lab 05/21/20 0303 05/22/20 0525 05/23/20 0615 05/24/20 0535 05/25/20 0345  WBC 8.8 8.5 7.9 8.1 8.0  NEUTROABS 7.1 6.9 5.9 6.0 5.7  HGB 9.1* 9.5* 9.7* 9.7* 9.7*  HCT 29.9* 31.2* 32.6* 31.5* 32.1*  MCV 91.4 92.6 91.8 91.8 92.0  PLT 206 220 237 241 248   Cardiac Enzymes: No results for input(s): CKTOTAL, CKMB, CKMBINDEX, TROPONINI in the last 168 hours. BNP: Invalid input(s): POCBNP CBG: Recent Labs  Lab 05/24/20 1606 05/24/20 2027 05/24/20 2305 05/25/20 0337 05/25/20 0738  GLUCAP 173* 132* 115* 123* 122*  IMAGING RESULTS:  Imaging: No results found. @PROBHOSP @ No results found.     ASSESSMENT AND PLAN    -Multidisciplinary rounds held today  Acute hypoxic respiratory failure due to COVID-19 ARDS COVID-19 pneumonia Underlying  morbid obesity Mechanical intubation -s/p Self extubation- now on HFNC-down to 4L/min - Aggressive BPH  - PT  - OT  -evaluation for LTAC - reviewed with case management  - swallow evaluation - thickened pureed diet at this time Maintain O2 sats 88% or higher  Follow cultures    ID -continue IV abx as prescibed -follow up cultures  GI/Nutrition GI PROPHYLAXIS as indicated DIET-->TF's as tolerated Constipation protocol as indicated  ENDO - ICU hypoglycemic\Hyperglycemia protocol -check FSBS per protocol   ELECTROLYTES -follow labs as needed -replace as needed -pharmacy consultation   DVT/GI PRX ordered -SCDs  TRANSFUSIONS AS NEEDED MONITOR FSBS ASSESS the need for LABS as needed   Critical care provider statement:    Critical care time (minutes):  33   Critical care time was exclusive of:  Separately billable procedures and treating other patients   Critical care was necessary to treat or prevent imminent or life-threatening deterioration of the following conditions:  ARDS due to COVID19 pneumonia   Critical care was time spent personally by me on the following activities:  Development of treatment plan with patient or surrogate, discussions with consultants, evaluation of patient's response to treatment, examination of patient, obtaining history from patient or surrogate, ordering and performing treatments and interventions, ordering and review of laboratory studies and re-evaluation of patient's condition.  I assumed direction of critical care for this patient from another provider in my specialty: no    This document was prepared using Dragon voice recognition software and may include unintentional dictation errors.    , M.D.  Division of Pulmonary & Critical Care Medicine  [facility]

## 2020-05-25 NOTE — Progress Notes (Addendum)
Physical Therapy Treatment Patient Details Name: Collin Brown MRN: [id number] DOB: May 22, 1986 Today's Date: 05/25/2020    History of Present Illness Collin Brown is a34yoM who comes to [facility] on 9/23 after progression of dyspnea. Pt recently (+)COVID19 along with other family. Pt required intubation on 9/25, self extubated on 10/10. At evaluation pt presenting on HHHF @55L /min, 90%FiO2.    PT Comments    Pt in bed upon entry, agreeable to participate. Pt awake, watching TV. Pt less alert compared to previous day, slightly more drowsy, delayed, however continues to do well with following commands for exercise. Phonation volume and RUE movement velocity continue to improve, however it remains difficult for patient to express himself verbally. Continued with BLE strengthening and RUE strengthening. Pt tolerates 2 sets of 15 in nearly all exercises for legs. BLE are nearly equal in strength now, except for Left ankle/toes which remains 50% more limited in strength/ROM. Left cervical rotation continues to be limited also. Pt on 10L HFNC this session, sats remain in 90s% and HR remains 110-125bpm as it has the last 4 days. Pt pulled up in bed with RN at EOS, trendelenburged bed to lock in hips, then [facility] brought up to 45 degrees. Will update DC recommendations to CIR at this time, so long as O2 requirements continue to decrease, however if Pt continues to require 10L or more, LTACH may remain the most appropriate option at DC.    Follow Up Recommendations  Supervision for mobility/OOB;Supervision - Intermittent;CIR     Equipment Recommendations  Other (comment) (will defer recs at this time)    Recommendations for Other Services Rehab consult     Precautions / Restrictions Precautions Precautions: Fall Precaution Comments: significant deconditioning and weakness, struggling c limb control; ?left neglect    Mobility  Bed Mobility Overal bed mobility: Needs Assistance Bed Mobility:  Rolling Rolling: +2 for safety/equipment;Total assist            Transfers                 General transfer comment: unsafe to attempt at this time  Ambulation/Gait                 Stairs             Wheelchair Mobility    Modified Rankin (Stroke Patients Only)       Balance                                            Cognition Arousal/Alertness: Awake/alert Behavior During Therapy: WFL for tasks assessed/performed;Flat affect Overall Cognitive Status: Within Functional Limits for tasks assessed                               Problem Solving: Slow processing;Decreased initiation (slightly more delayed today, compared to prevous day)        Exercises Low Level/ICU Exercises Ankle Circles/Pumps: Both;Supine;10 reps;AROM Short Arc Quad: AAROM;Both;20 reps;PROM;Supine;10 reps Hip ABduction/ADduction: AAROM;Both;20 reps;Supine;PROM;10 reps Heel Slides: AAROM;Both;10 reps;20 reps;Supine Breathing Exercises: 10 reps (sustained "Ahhhh" 1x10x2-3secH) Shoulder Flexion: Right;10 reps;Strengthening Elbow Flexion: Right;10 reps;Strengthening Other Exercises Other Exercises: Cervical retraction into author's hand 1x10x2secH (deviates to Right due to left weakness) Other Exercises: Left cervical rotation AA/ROM 1x12 Other Exercises: Manually resisted Leg press 1x15 bilat    General Comments  Pertinent Vitals/Pain Pain Assessment: No/denies pain    Home Living                      Prior Function            PT Goals (current goals can now be found in the care plan section) Acute Rehab PT Goals Patient Stated Goal: regain strength and mobilty PT Goal Formulation: With patient Time For Goal Achievement: 06/18/20 Potential to Achieve Goals: Fair Progress towards PT goals: Progressing toward goals    Frequency    Min 2X/week      PT Plan Current plan remains appropriate    Co-evaluation               AM-PAC PT "6 Clicks" Mobility   Outcome Measure  Help needed turning from your back to your side while in a flat bed without using bedrails?: Total Help needed moving from lying on your back to sitting on the side of a flat bed without using bedrails?: Total Help needed moving to and from a bed to a chair (including a wheelchair)?: Total Help needed standing up from a chair using your arms (e.g., wheelchair or bedside chair)?: Total Help needed to walk in hospital room?: Total Help needed climbing 3-5 steps with a railing? : Total 6 Click Score: 6    End of Session Equipment Utilized During Treatment: Oxygen Activity Tolerance: Patient tolerated treatment well;No increased pain Patient left: in bed;with nursing/sitter in room;with call bell/phone within reach Nurse Communication: Mobility status PT Visit Diagnosis: Difficulty in walking, not elsewhere classified (R26.2);Other abnormalities of gait and mobility (R26.89);Muscle weakness (generalized) (M62.81)     Time: 5035-4656 PT Time Calculation (min) (ACUTE ONLY): 45 min  Charges:  $Therapeutic Exercise: 38-52 mins                     4:36 PM, 05/25/20 Rosamaria Lints, PT, DPT Physical Therapist - [facility]  [phone] (ASCOM)    [name] 05/25/2020, 4:32 PM

## 2020-05-25 NOTE — Progress Notes (Signed)
PHARMACY CONSULT NOTE  Pharmacy Consult for Electrolyte Monitoring and Replacement   Recent Labs: Potassium (mmol/L)  Date Value  05/25/2020 3.6   Magnesium (mg/dL)  Date Value  86/76/7209 2.1   Calcium (mg/dL)  Date Value  47/04/6282 8.6 (L)   Albumin (g/dL)  Date Value  66/29/4765 2.8 (L)   Phosphorus (mg/dL)  Date Value  46/50/3546 3.8   Sodium (mmol/L)  Date Value  05/25/2020 144   Corrected Calcium: 9.76 mg/dL   Assessment: Pharmacy has been consulted for electrolyte replacement in this 34 YOM requiring intubation and admission to the CCU for COVID PNA. Treatment including IV decadron, leading to the need for high amounts of insulin and eventually an insulin drip which has had to be restarted following several failed attempts at transitioning to subcutaneous insulin. He has been transitioned back to subcutaneous insulin. Patient self-extubated 10/10.  Lasix 80mg  IV BID discontinued 10/12  5% dextrose at 50 mL/hr discontinued 10/14 for improvement in sodium.  Goal of Therapy:  Electrolytes WNL  Plan:   Electrolytes have not required replacement now that he is on a diet.  Sodium 143 today. MIVF discontinued.  Sign off consult.  11/14, PharmD 05/25/2020 1:14 PM

## 2020-05-26 ENCOUNTER — Inpatient Hospital Stay: Payer: BC Managed Care – PPO

## 2020-05-26 ENCOUNTER — Encounter: Payer: Self-pay | Admitting: Hospitalist

## 2020-05-26 DIAGNOSIS — J9601 Acute respiratory failure with hypoxia: Secondary | ICD-10-CM | POA: Diagnosis not present

## 2020-05-26 DIAGNOSIS — G4733 Obstructive sleep apnea (adult) (pediatric): Secondary | ICD-10-CM | POA: Diagnosis not present

## 2020-05-26 DIAGNOSIS — U071 COVID-19: Secondary | ICD-10-CM | POA: Diagnosis not present

## 2020-05-26 DIAGNOSIS — J1282 Pneumonia due to coronavirus disease 2019: Secondary | ICD-10-CM | POA: Diagnosis not present

## 2020-05-26 LAB — BASIC METABOLIC PANEL
Anion gap: 9 (ref 5–15)
BUN: 25 mg/dL — ABNORMAL HIGH (ref 6–20)
CO2: 29 mmol/L (ref 22–32)
Calcium: 9 mg/dL (ref 8.9–10.3)
Chloride: 103 mmol/L (ref 98–111)
Creatinine, Ser: 0.47 mg/dL — ABNORMAL LOW (ref 0.61–1.24)
GFR, Estimated: >60 mL/min (ref >60)
Glucose, Bld: 154 mg/dL — ABNORMAL HIGH (ref 70–99)
Potassium: 4 mmol/L (ref 3.5–5.1)
Sodium: 141 mmol/L (ref 135–145)

## 2020-05-26 LAB — MAGNESIUM: Magnesium: 2.2 mg/dL (ref 1.7–2.4)

## 2020-05-26 LAB — PHOSPHORUS: Phosphorus: 4.6 mg/dL (ref 2.5–4.6)

## 2020-05-26 LAB — GLUCOSE, CAPILLARY
Glucose-Capillary: 144 mg/dL — ABNORMAL HIGH (ref 70–99)
Glucose-Capillary: 141 mg/dL — ABNORMAL HIGH (ref 70–99)
Glucose-Capillary: 124 mg/dL — ABNORMAL HIGH (ref 70–99)
Glucose-Capillary: 157 mg/dL — ABNORMAL HIGH (ref 70–99)
Glucose-Capillary: 157 mg/dL — ABNORMAL HIGH (ref 70–99)
Glucose-Capillary: 164 mg/dL — ABNORMAL HIGH (ref 70–99)
Glucose-Capillary: 148 mg/dL — ABNORMAL HIGH (ref 70–99)

## 2020-05-26 LAB — CBC WITH DIFFERENTIAL/PLATELET
Abs Immature Granulocytes: 0.09 10*3/uL — ABNORMAL HIGH (ref 0.00–0.07)
Basophils Absolute: 0 10*3/uL (ref 0.0–0.1)
Basophils Relative: 0 %
Eosinophils Absolute: 0.2 10*3/uL (ref 0.0–0.5)
Eosinophils Relative: 3 %
HCT: 34.7 % — ABNORMAL LOW (ref 39.0–52.0)
Hemoglobin: 10.8 g/dL — ABNORMAL LOW (ref 13.0–17.0)
Immature Granulocytes: 1 %
Lymphocytes Relative: 13 %
Lymphs Abs: 1 10*3/uL (ref 0.7–4.0)
MCH: 28.4 pg (ref 26.0–34.0)
MCHC: 31.1 g/dL (ref 30.0–36.0)
MCV: 91.3 fL (ref 80.0–100.0)
Monocytes Absolute: 0.4 10*3/uL (ref 0.1–1.0)
Monocytes Relative: 5 %
Neutro Abs: 5.9 10*3/uL (ref 1.7–7.7)
Neutrophils Relative %: 78 %
Platelets: 253 10*3/uL (ref 150–400)
RBC: 3.8 MIL/uL — ABNORMAL LOW (ref 4.22–5.81)
RDW: 19.6 % — ABNORMAL HIGH (ref 11.5–15.5)
WBC: 7.6 10*3/uL (ref 4.0–10.5)
nRBC: 0.3 % — ABNORMAL HIGH (ref 0.0–0.2)

## 2020-05-26 IMAGING — DX DG SHOULDER 2+V*L*
2 series · 2 of 2 positions shown · non-contrast
Comparison: Chest radiograph from [DATE]

CLINICAL DATA: 34-year-old male with left shoulder pain

EXAM:
LEFT SHOULDER - 2+ VIEW

[shoulder ap]
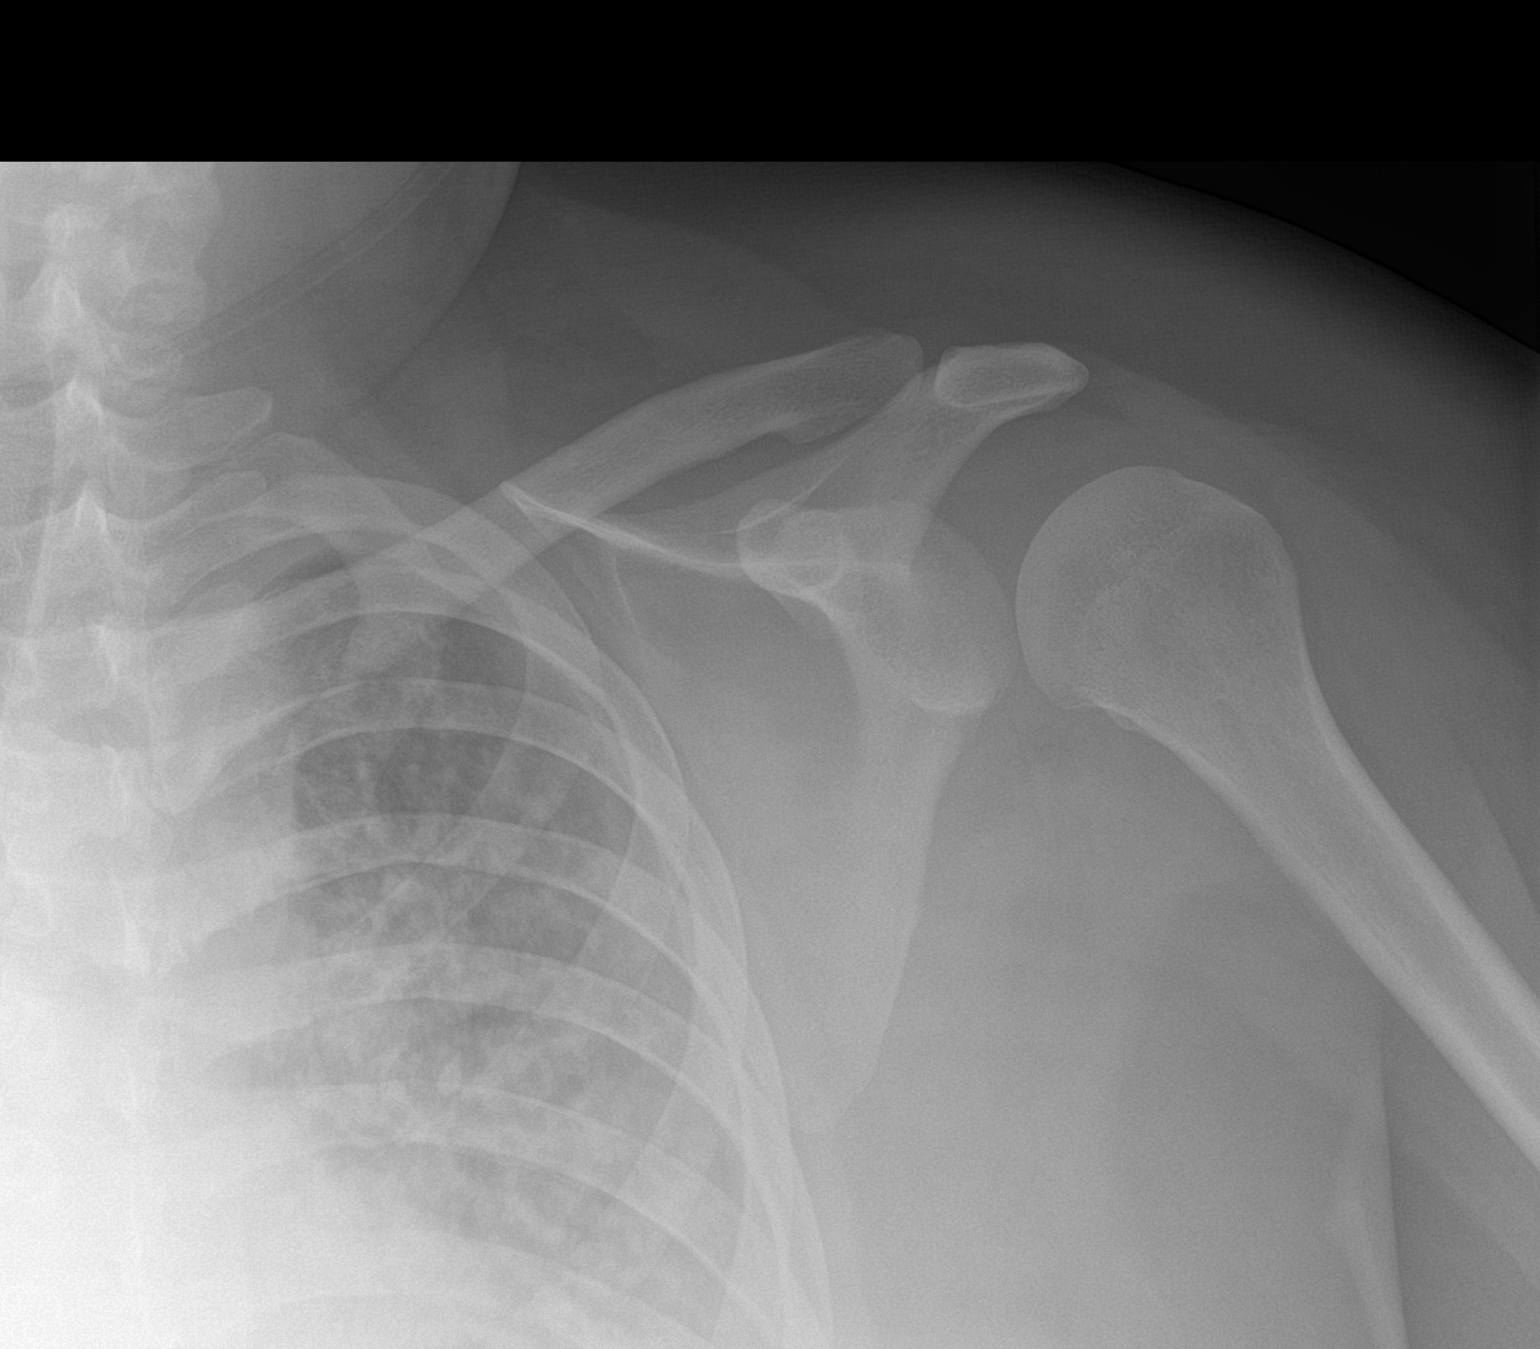

[shoulder obl]
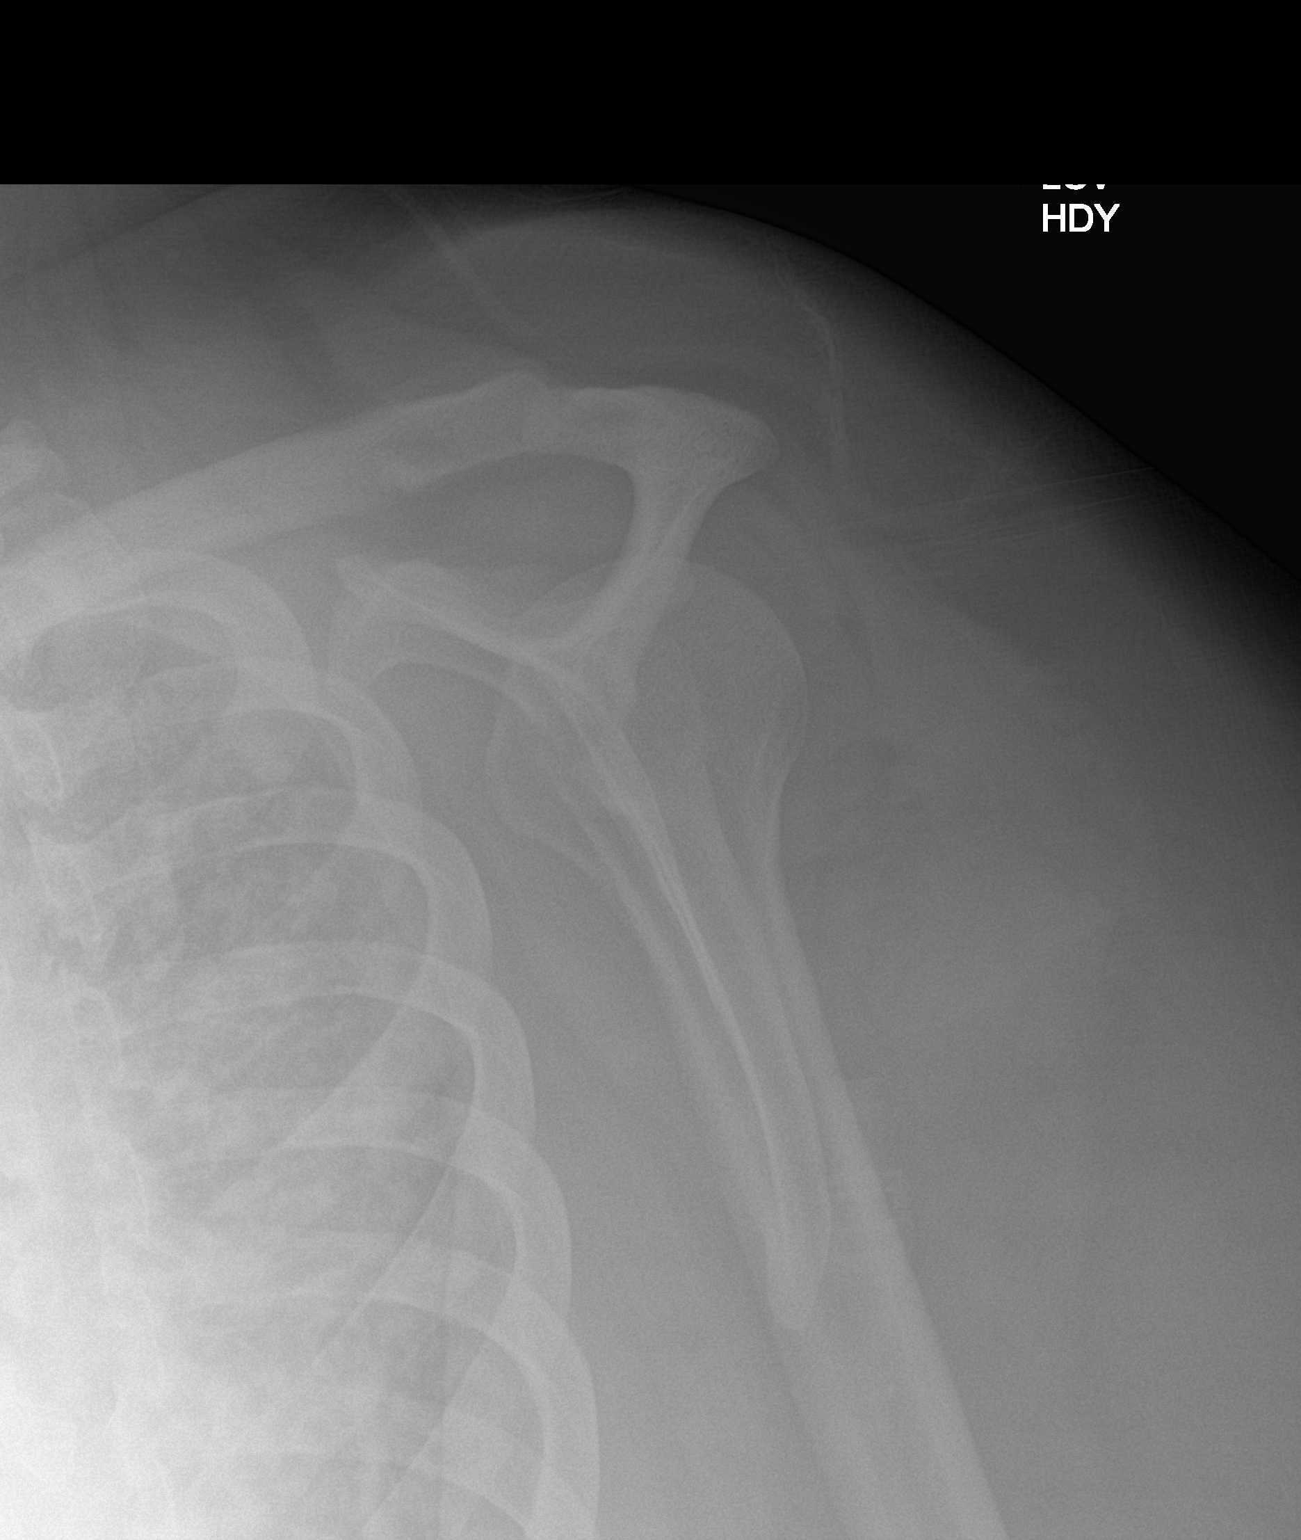

[2 of 2 positions shown; findings below may reference images not displayed]

FINDINGS: There is no evidence of fracture or dislocation. There is no
evidence of arthropathy or other focal bone abnormality. Soft
tissues are unremarkable.
IMPRESSION: No acute fracture or malalignment.

## 2020-05-26 MED ORDER — ENOXAPARIN SODIUM 100 MG/ML ~~LOC~~ SOLN
0.5000 mg/kg | SUBCUTANEOUS | Status: DC
  Administered 2020-05-27 – 2020-06-07 (×12): 85 mg via SUBCUTANEOUS
  Filled 2020-05-26 (×12): qty 1

## 2020-05-26 MED ORDER — MORPHINE SULFATE (PF) 2 MG/ML IV SOLN
1.0000 mg | INTRAVENOUS | Status: DC | PRN
  Administered 2020-05-26 – 2020-06-08 (×16): 1 mg via INTRAVENOUS
  Filled 2020-05-26 (×19): qty 1

## 2020-05-26 MED ORDER — FUROSEMIDE 10 MG/ML IJ SOLN
40.0000 mg | Freq: Every day | INTRAMUSCULAR | Status: DC
  Administered 2020-05-26 – 2020-05-29 (×4): 40 mg via INTRAVENOUS
  Filled 2020-05-26 (×4): qty 4

## 2020-05-26 MED ORDER — ENOXAPARIN SODIUM 40 MG/0.4ML ~~LOC~~ SOLN: 40.0000 mg | SUBCUTANEOUS | Status: DC

## 2020-05-26 NOTE — Progress Notes (Signed)
CRITICAL CARE PROGRESS NOTE    Name: Collin Brown MRN: [id number] DOB: 1985/08/19     LOS: 23   SUBJECTIVE FINDINGS & SIGNIFICANT EVENTS    Patient description:  34 yo with OSA, morbid obesity BMI>56 came in with flu like illness diagnosed with COVID19 pneumonia was initially ion Medical floor with hospitalist service initiated Remdesevir and barictitinib, he progressively became more dyspneic he was placed on BIPAP and did not tolerate then placed on HFNC did not tolerate patient himself consented to MV and was intubated in MICU. Mother was notified who is sick at home with COVID.   Events:  9/27- patient on 65%FiO2 with proning protocol. CVP trending, goal negative fluid balance.   05/08/20- patient with worsening ventilation increased FiO2 to 100% overnight and had episode of severe bradycardia.  Concern for mucus plugging will perform recruitment maneuvers and possible bronchoscopy.  We were able to perform tracheal aspirate and have weaned down fIO2 to 75% 05/09/20- patient had bradycardic episode and he desaturated overnight with temporary increase to 100FiO2.  Lasix q12 going. I spoke to brother today for update.  His tracheal aspirate came back normal flora so we will stop antibiotic.  05/10/20-patient with pneumomediastinum and on maximal setting on ventilator. Poor prognosis very unfortunate young man. I called and updated Mother today Collin Brown.  05/11/20- patient weaned to 80%. Will continue with weaning and proning protocol  05/12/20- patient remains critically ill.  Mother at bedside we discussed his care plan, she is with strong faith and continues to pray for her son to improve.  05/13/20- patient remains crtically ill.  Proning per protocol. No significant events overnight.  05/14/20- Continues to be  febrile, FiO2 up to 100%, improved down to 60% after increasing PEEP from 10 to 16.  05/15/20- patient remains crtically ill, overall status unchanged 05/16/20-persistent fevers, CT head and chest, does have sinus opacification. 10/7 severe ARDS 10/8 febrile, severe ARDS 10/9 severe ARDS 10/10 self extubated, placed on biPAP 10/11 on biPAP tolerating well, precedex 10/12 on biPAP last night 10/13 biPAP last night for OSA 05/25/20- patient is awake and alert, he is able to respond to verbal communication but is with encephalopathy and slow to communicate.  Weaned HFNC down today per RT. Signing out to [facility] for am pick up.  05/26/20- Patient reports left shoulder pain, will obtain L shoulder DG.  Mentation slowly improving.  He is edematous and does have episodic desaturation of sPO2.    Lines/tubes  9/25 - Left IJ placed  : Airway 8 mm (Active)  Secured at (cm) 26 cm 05/07/20 0801  Measured From Lips 05/07/20 0801  Secured Location Center 05/07/20 0801  Secured By [name] 05/07/20 0801  Tube Holder Repositioned Yes 05/07/20 0801  Cuff Pressure (cm H2O) 26 cm H2O 05/07/20 0801  Site Condition Dry 05/07/20 0801     CVC Triple Lumen 05/05/20 Left Internal jugular (Active)  Indication for Insertion or Continuance of Line Vasoactive infusions;Poor Vasculature-patient has had multiple peripheral attempts or PIVs lasting less than 24 hours 05/06/20 2130  Site Assessment Clean;Dry;Intact 05/06/20 2130  Proximal Lumen Status Infusing 05/06/20 2130  Medial Lumen Status Infusing 05/06/20 2130  Distal Lumen Status Infusing 05/06/20 2130  Dressing Type Transparent;Occlusive 05/06/20 2130  Dressing Status Clean;Dry;Intact 05/06/20 2130  Antimicrobial disc in place? Yes 05/06/20 2130  Line Care Connections checked and tightened 05/06/20 2130  Dressing Intervention Dressing reinforced 05/06/20 0900  Dressing Change Due 05/12/20 05/06/20 2130  NG/OG Tube Orogastric Center mouth  Xray 65 cm (Active)  Cm Marking at Nare/Corner of Mouth (if applicable) 65 cm 05/06/20 2130  Site Assessment Clean;Dry;Intact 05/06/20 2130  Ongoing Placement Verification No change in cm markings or external length of tube from initial placement;No change in respiratory status;No acute changes, not attributed to clinical condition;Xray 05/06/20 2130  Status Clamped 05/06/20 2130     Urethral Catheter Waynard Edwards, RN 16 Fr. (Active)  Indication for Insertion or Continuance of Catheter Unstable critically ill patients first 24-48 hours (See Criteria) 05/06/20 2000  Site Assessment Clean;Intact;Prolapse;Swelling 05/06/20 2000  Catheter Maintenance Bag below level of bladder;Catheter secured;Drainage bag/tubing not touching floor;Insertion date on drainage bag;No dependent loops;Seal intact 05/06/20 2000  Collection Container Standard drainage bag 05/06/20 2000  Securement Method Securing device (Describe) 05/06/20 2000  Urinary Catheter Interventions (if applicable) Unclamped 05/06/20 2000  Output (mL) 100 mL 05/07/20 0445    Microbiology/Sepsis markers: Results for orders placed or performed during the hospital encounter of 05/03/20  Blood Culture (routine x 2)     Status: None   Collection Time: 05/03/20  4:33 PM   Specimen: BLOOD  Result Value Ref Range Status   Specimen Description BLOOD BLOOD LEFT HAND  Final   Special Requests   Final    BOTTLES DRAWN AEROBIC AND ANAEROBIC Blood Culture results may not be optimal due to an inadequate volume of blood received in culture bottles   Culture   Final    NO GROWTH 5 DAYS Performed at [facility], [address] [place], [place] [address]    Report Status 05/08/2020 FINAL  Final  Blood Culture (routine x 2)     Status: None   Collection Time: 05/03/20  4:33 PM   Specimen: BLOOD  Result Value Ref Range Status   Specimen Description BLOOD BLOOD RIGHT HAND  Final   Special Requests   Final    BOTTLES DRAWN AEROBIC AND ANAEROBIC  Blood Culture adequate volume   Culture   Final    NO GROWTH 5 DAYS Performed at [facility], [address] [place], [place] [address]    Report Status 05/08/2020 FINAL  Final  Respiratory Panel by RT PCR (Flu A&B, Covid) -     Status: Abnormal   Collection Time: 05/03/20  7:32 PM  Result Value Ref Range Status   SARS Coronavirus 2 by RT PCR POSITIVE (A) NEGATIVE Final    Comment: RESULT CALLED TO, READ BACK BY AND VERIFIED WITH: Benson Norway RN 2114 05/03/20 HNM/AR    Influenza A by PCR NEGATIVE NEGATIVE Final   Influenza B by PCR NEGATIVE NEGATIVE Final    Comment: Performed at [facility], [address] [place], [place] [address]  MRSA PCR Screening     Status: None   Collection Time: 05/06/20 11:51 AM   Specimen: Nasal Mucosa; Nasopharyngeal  Result Value Ref Range Status   MRSA by PCR NEGATIVE NEGATIVE Final    Comment:        The GeneXpert MRSA Assay (FDA approved for NASAL specimens only), is one component of a comprehensive MRSA colonization surveillance program. It is not intended to diagnose MRSA infection nor to guide or monitor treatment for MRSA infections. Performed at [facility], [address] [place], [place] [address]   Culture, respiratory (non-expectorated)     Status: None   Collection Time: 05/07/20 10:55 AM   Specimen: Tracheal Aspirate; Respiratory  Result Value Ref Range Status   Specimen Description  Final    TRACHEAL ASPIRATE Performed at [facility], [address] [place], [place] [address]    Special Requests   Final    NONE Performed at [facility], [address] [place], [place] [address]    Gram Stain   Final    RARE WBC PRESENT,BOTH PMN AND MONONUCLEAR NO ORGANISMS SEEN    Culture   Final    RARE Normal respiratory flora-no Staph aureus or Pseudomonas seen Performed at [facility], [address], [place], [place] [address]    Report Status 05/09/2020 FINAL   Final  CULTURE, BLOOD (ROUTINE X 2) w Reflex to ID Panel     Status: None   Collection Time: 05/10/20 12:47 PM   Specimen: BLOOD  Result Value Ref Range Status   Specimen Description BLOOD BLOOD RIGHT HAND  Final   Special Requests   Final    BOTTLES DRAWN AEROBIC AND ANAEROBIC Blood Culture adequate volume   Culture   Final    NO GROWTH 5 DAYS Performed at [facility], [address] [place], [place] [address]    Report Status 05/15/2020 FINAL  Final  CULTURE, BLOOD (ROUTINE X 2) w Reflex to ID Panel     Status: None   Collection Time: 05/10/20  1:21 PM   Specimen: BLOOD  Result Value Ref Range Status   Specimen Description BLOOD BLOOD RIGHT HAND  Final   Special Requests   Final    BOTTLES DRAWN AEROBIC AND ANAEROBIC Blood Culture adequate volume   Culture   Final    NO GROWTH 5 DAYS Performed at [facility], [address], [place], [place] [address]    Report Status 05/15/2020 FINAL  Final  Culture, respiratory (non-expectorated)     Status: None   Collection Time: 05/14/20  8:58 AM   Specimen: Tracheal Aspirate; Respiratory  Result Value Ref Range Status   Specimen Description   Final    TRACHEAL ASPIRATE Performed at [facility], [address] [place], [place] [address]    Special Requests   Final    NONE Performed at [facility], [address] [place], [place] [address]    Gram Stain   Final    RARE WBC PRESENT,BOTH PMN AND MONONUCLEAR NO ORGANISMS SEEN    Culture   Final    RARE Normal respiratory flora-no Staph aureus or Pseudomonas seen Performed at [facility], [address] [place], [place] [address]    Report Status 05/16/2020 FINAL  Final  CULTURE, BLOOD (ROUTINE X 2) w Reflex to ID Panel     Status: None   Collection Time: 05/14/20  9:33 AM   Specimen: BLOOD LEFT HAND  Result Value Ref Range Status   Specimen Description BLOOD LEFT HAND  Final   Special Requests   Final    BOTTLES DRAWN  AEROBIC AND ANAEROBIC Blood Culture adequate volume   Culture   Final    NO GROWTH 7 DAYS Performed at [facility], [address] [place], [place] [address]    Report Status 05/21/2020 FINAL  Final  CULTURE, BLOOD (ROUTINE X 2) w Reflex to ID Panel     Status: None   Collection Time: 05/14/20 10:17 AM   Specimen: BLOOD LEFT HAND  Result Value Ref Range Status   Specimen Description BLOOD LEFT HAND  Final   Special Requests   Final    BOTTLES DRAWN AEROBIC AND ANAEROBIC Blood Culture  adequate volume   Culture   Final    NO GROWTH 7 DAYS Performed at [facility], [address] [place] [place], [place] [address]    Report Status 05/21/2020 FINAL  Final  Urine Culture     Status: None   Collection Time: 05/14/20  2:33 PM   Specimen: Urine, Random  Result Value Ref Range Status   Specimen Description   Final    URINE, RANDOM Performed at [facility], [address], [place], [place] [address]    Special Requests   Final    NONE Performed at [facility], [address] [place], [place] [address]    Culture   Final    NO GROWTH Performed at [facility], [address], [place], [place] [address]    Report Status 05/15/2020 FINAL  Final    Anti-infectives:  Anti-infectives (From admission, onward)   Start     Dose/Rate Route Frequency Ordered Stop   05/16/20 1800  Ampicillin-Sulbactam (UNASYN) 3 g in sodium chloride 0.9 % 100 mL IVPB  Status:  Discontinued        3 g 200 mL/hr over 30 Minutes Intravenous Every 6 hours 05/16/20 1458 05/22/20 1008   05/14/20 0600  ceFEPIme (MAXIPIME) 2 g in sodium chloride 0.9 % 100 mL IVPB  Status:  Discontinued        2 g 200 mL/hr over 30 Minutes Intravenous Every 8 hours 05/14/20 0533 05/16/20 1458   05/07/20 1200  meropenem (MERREM) 1 g in sodium chloride 0.9 % 100 mL IVPB  Status:  Discontinued        1 g 200 mL/hr over 30 Minutes Intravenous Every 8 hours 05/07/20 1020 05/09/20 1019    05/06/20 1200  vancomycin (VANCOCIN) IVPB 1000 mg/200 mL premix  Status:  Discontinued        1,000 mg 200 mL/hr over 60 Minutes Intravenous Every 8 hours 05/05/20 2330 05/07/20 1020   05/06/20 0200  vancomycin (VANCOREADY) IVPB 500 mg/100 mL       "Followed by" Linked Group Details   500 mg 100 mL/hr over 60 Minutes Intravenous  Once 05/05/20 2330 05/06/20 0454   05/06/20 0000  vancomycin (VANCOREADY) IVPB 2000 mg/400 mL       "Followed by" Linked Group Details   2,000 mg 200 mL/hr over 120 Minutes Intravenous  Once 05/05/20 2330 05/06/20 0304   05/05/20 2315  cefTRIAXone (ROCEPHIN) 2 g in sodium chloride 0.9 % 100 mL IVPB  Status:  Discontinued        2 g 200 mL/hr over 30 Minutes Intravenous Daily at 10 pm 05/05/20 2307 05/07/20 1020   05/04/20 1000  remdesivir 100 mg in sodium chloride 0.9 % 100 mL IVPB       "Followed by" Linked Group Details   100 mg 200 mL/hr over 30 Minutes Intravenous Daily 05/03/20 1718 05/07/20 0941   05/03/20 1800  remdesivir 200 mg in sodium chloride 0.9% 250 mL IVPB       "Followed by" Linked Group Details   200 mg 580 mL/hr over 30 Minutes Intravenous Once 05/03/20 1718 05/03/20 1941       Consults: Treatment Team:  Pccm, Raymond Gurney, MD     PAST MEDICAL HISTORY   Past Medical History:  Diagnosis Date  . Hearing loss in right ear   . Sleep apnea      SURGICAL HISTORY   Past Surgical History:  Procedure Laterality Date  . TONSILLECTOMY  FAMILY HISTORY   Family History  Problem Relation Age of Onset  . Diabetes Mellitus II Mother   . Hypertension Mother   . Diabetes Mellitus II Father   . High blood pressure Father      SOCIAL HISTORY   Social History   Tobacco Use  . Smoking status: Never Smoker  . Smokeless tobacco: Never Used  Vaping Use  . Vaping Use: Never used  Substance Use Topics  . Alcohol use: No  . Drug use: No     MEDICATIONS   Current Medication:  Current Facility-Administered Medications:   .  0.9 %  sodium chloride infusion, , Intravenous, PRN, Staci Acosta, MD, Stopped at 05/22/20 2774 .  acetaminophen (TYLENOL) tablet 1,000 mg, 1,000 mg, Oral, Q6H PRN, Manuela Schwartz, NP, 1,000 mg at 05/19/20 0903 .  ascorbic acid (VITAMIN C) tablet 500 mg, 500 mg, Oral, Daily, Kasa, Kurian, MD, 500 mg at 05/25/20 0900 .  aspirin chewable tablet 81 mg, 81 mg, Oral, Daily, Kasa, Kurian, MD, 81 mg at 05/25/20 0900 .  chlorhexidine (PERIDEX) 0.12 % solution 15 mL, 15 mL, Mouth Rinse, BID, Kasa, Kurian, MD, 15 mL at 05/26/20 1010 .  Chlorhexidine Gluconate Cloth 2 % PADS 6 each, 6 each, Topical, Daily, Salena Saner, MD, 6 each at 05/26/20 1010 .  dextrose 50 % solution 0-50 mL, 0-50 mL, Intravenous, PRN, Vida Rigger, MD .  Melene Muller ON 05/27/2020] enoxaparin (LOVENOX) injection 85 mg, 0.5 mg/kg, Subcutaneous, Q24H, Lurene Shadow, MD .  famotidine (PEPCID) tablet 20 mg, 20 mg, Oral, Daily, Kasa, Kurian, MD, 20 mg at 05/25/20 0859 .  feeding supplement (NEPRO CARB STEADY) liquid 237 mL, 237 mL, Oral, TID BM, Kasa, Kurian, MD, 237 mL at 05/25/20 0901 .  furosemide (LASIX) injection 40 mg, 40 mg, Intravenous, Daily, Mersadie Kavanaugh, MD .  insulin aspart (novoLOG) injection 0-20 Units, 0-20 Units, Subcutaneous, Q4H, Lowella Bandy, RPH, 3 Units at 05/26/20 0800 .  insulin detemir (LEVEMIR) injection 20 Units, 20 Units, Subcutaneous, BID, Harlon Ditty D, NP, 20 Units at 05/26/20 1010 .  MEDLINE mouth rinse, 15 mL, Mouth Rinse, q12n4p, Kasa, Kurian, MD, 15 mL at 05/25/20 1700 .  multivitamin with minerals tablet 1 tablet, 1 tablet, Oral, Daily, Erin Fulling, MD, 1 tablet at 05/25/20 0859 .  polyethylene glycol (MIRALAX / GLYCOLAX) packet 17 g, 17 g, Oral, Daily, Kasa, Kurian, MD, 17 g at 05/25/20 0901 .  sodium chloride flush (NS) 0.9 % injection 10-40 mL, 10-40 mL, Intracatheter, Q12H, Kasa, Kurian, MD, 10 mL at 05/25/20 2155 .  sodium chloride flush (NS) 0.9 % injection 10-40 mL, 10-40  mL, Intracatheter, PRN, Erin Fulling, MD    ALLERGIES   Patient has no known allergies.    REVIEW OF SYSTEMS    10 point ROS unable to perform due to sedation on MV  PHYSICAL EXAMINATION   Vital Signs: Temp:  [98.2 F (36.8 C)-99.8 F (37.7 C)] 98.5 F (36.9 C) (10/16 0400) Pulse Rate:  [92-136] 116 (10/16 1000) Resp:  [17-31] 23 (10/16 1000) BP: (130-154)/(72-123) 149/88 (10/16 0900) SpO2:  [79 %-100 %] 89 % (10/16 1000) FiO2 (%):  [60 %] 60 % (10/15 2052)  GENERAL:NAD HEAD: Normocephalic, atraumatic.  EYES: Pupils equal, round, reactive to light.  No scleral icterus.  MOUTH: Moist mucosal membrane. NECK: Supple. No thyromegaly. No nodules. No JVD.  PULMONARY: rhonchi bilaterally  CARDIOVASCULAR: S1 and S2. Regular rate and rhythm. No murmurs, rubs, or gallops.  GASTROINTESTINAL: Soft, nontender, non-distended. No  masses. Positive bowel sounds. No hepatosplenomegaly.  MUSCULOSKELETAL: No swelling, clubbing, or edema.  NEUROLOGIC: Mild distress due to acute illness, +confusion  SKIN:intact,warm,dry   PERTINENT DATA     Infusions: . sodium chloride Stopped (05/22/20 [id number])   Scheduled Medications: . vitamin C  500 mg Oral Daily  . aspirin  81 mg Oral Daily  . chlorhexidine  15 mL Mouth Rinse BID  . Chlorhexidine Gluconate Cloth  6 each Topical Daily  . [START ON 05/27/2020] enoxaparin (LOVENOX) injection  0.5 mg/kg Subcutaneous Q24H  . famotidine  20 mg Oral Daily  . feeding supplement (NEPRO CARB STEADY)  237 mL Oral TID BM  . furosemide  40 mg Intravenous Daily  . insulin aspart  0-20 Units Subcutaneous Q4H  . insulin detemir  20 Units Subcutaneous BID  . mouth rinse  15 mL Mouth Rinse q12n4p  . multivitamin with minerals  1 tablet Oral Daily  . polyethylene glycol  17 g Oral Daily  . sodium chloride flush  10-40 mL Intracatheter Q12H   PRN Medications: sodium chloride, acetaminophen, dextrose, sodium chloride flush Hemodynamic parameters:    Intake/Output: 10/15 0701 - 10/16 0700 In: 249 [P.O.:236; I.V.:13] Out: 1500 [Urine:1500]  Ventilator  Settings: FiO2 (%):  [60 %] 60 %   Other Labs:     LAB RESULTS:  Basic Metabolic Panel: Recent Labs  Lab 05/22/20 0525 05/22/20 0525 05/23/20 0615 05/23/20 0615 05/24/20 0535 05/24/20 0535 05/25/20 0345 05/26/20 0543  NA 148*  --  149*  --  143  --  144 141  K 3.2*   < > 3.5   < > 3.5   < > 3.6 4.0  CL 104  --  104  --  102  --  103 103  CO2 35*  --  36*  --  33*  --  28 29  GLUCOSE 129*  --  133*  --  128*  --  130* 154*  BUN 45*  --  41*  --  30*  --  26* 25*  CREATININE 0.69  --  0.75  --  0.65  --  0.54* 0.47*  CALCIUM 9.0  --  8.9  --  8.7*  --  8.6* 9.0  MG 2.4  --  2.3  --  2.2  --  2.1 2.2  PHOS 4.2  --  4.1  --  4.0  --  3.8 4.6   < > = values in this interval not displayed.   Liver Function Tests: No results for input(s): AST, ALT, ALKPHOS, BILITOT, PROT, ALBUMIN in the last 168 hours. No results for input(s): LIPASE, AMYLASE in the last 168 hours. No results for input(s): AMMONIA in the last 168 hours. CBC: Recent Labs  Lab 05/22/20 0525 05/23/20 0615 05/24/20 0535 05/25/20 0345 05/26/20 0543  WBC 8.5 7.9 8.1 8.0 7.6  NEUTROABS 6.9 5.9 6.0 5.7 5.9  HGB 9.5* 9.7* 9.7* 9.7* 10.8*  HCT 31.2* 32.6* 31.5* 32.1* 34.7*  MCV 92.6 91.8 91.8 92.0 91.3  PLT 220 237 241 248 253   Cardiac Enzymes: No results for input(s): CKTOTAL, CKMB, CKMBINDEX, TROPONINI in the last 168 hours. BNP: Invalid input(s): POCBNP CBG: Recent Labs  Lab 05/25/20 1554 05/25/20 1931 05/25/20 2312 05/26/20 0410 05/26/20 0716  GLUCAP 156* 136* 129* 144* 141*       IMAGING RESULTS:  Imaging: DG Chest Port 1 View  Result Date: 05/25/2020 CLINICAL DATA:  COVID-19. EXAM: PORTABLE CHEST 1 VIEW COMPARISON:  May 18, 2020. FINDINGS:  Stable cardiomediastinal silhouette. Endotracheal and nasogastric tubes have been removed. Hypoinflation of the lungs is noted. Increased  bilateral patchy airspace opacities are noted concerning for multifocal pneumonia. No pneumothorax or pleural effusion is noted. Bony thorax is unremarkable. IMPRESSION: Increased bilateral patchy airspace opacities are noted concerning for worsening multifocal pneumonia. Endotracheal and nasogastric tubes have been removed. Electronically Signed   By: Lupita Raider M.D.   On: 05/25/2020 10:40   @PROBHOSP @ No results found.     ASSESSMENT AND PLAN    -Multidisciplinary rounds held today  Acute hypoxic respiratory failure due to COVID-19 ARDS COVID-19 pneumonia Underlying morbid obesity Mechanical intubation -s/p Self extubation- now on HFNC-down to 4L/min - Aggressive BPH  - PT  - OT  -evaluation for LTAC - reviewed with case management  - swallow evaluation - thickened pureed diet at this time Maintain O2 sats 88% or higher  Follow cultures     Left shoulder pain  DG left shoulder today -analgesia - prn morphine  ID -continue IV abx as prescibed -follow up cultures  GI/Nutrition GI PROPHYLAXIS as indicated DIET-->TF's as tolerated Constipation protocol as indicated  ENDO - ICU hypoglycemic\Hyperglycemia protocol -check FSBS per protocol   ELECTROLYTES -follow labs as needed -replace as needed -pharmacy consultation   DVT/GI PRX ordered -SCDs  TRANSFUSIONS AS NEEDED MONITOR FSBS ASSESS the need for LABS as needed   Critical care provider statement:    Critical care time (minutes):  33   Critical care time was exclusive of:  Separately billable procedures and treating other patients   Critical care was necessary to treat or prevent imminent or life-threatening deterioration of the following conditions:  ARDS due to COVID19 pneumonia   Critical care was time spent personally by me on the following activities:  Development of treatment plan with patient or surrogate, discussions with consultants, evaluation of patient's response to treatment, examination of  patient, obtaining history from patient or surrogate, ordering and performing treatments and interventions, ordering and review of laboratory studies and re-evaluation of patient's condition.  I assumed direction of critical care for this patient from another provider in my specialty: no    This document was prepared using Dragon voice recognition software and may include unintentional dictation errors.    , M.D.  Division of Pulmonary & Critical Care Medicine  [facility]

## 2020-05-26 NOTE — Progress Notes (Signed)
Speech Language Pathology Treatment: Dysphagia  Patient Details Name: Collin Brown MRN: [id number] DOB: [birth year] Today's Date: 05/26/2020 Time: 0910-1010 SLP Time Calculation (min) (ACUTE ONLY): 60 min  Assessment / Plan / Recommendation Clinical Impression  Pt seen for ongoing assessment of swallowing. He appears to continue to improve in overall presentation and alertness to task everyday; more engaged to verbally respond and able to follow instructions w/ min cues and encouragement. Noted rushed speech w/ reduced breath support -- decreased Cognitive awareness w/ this. He responded to verbal cues and model to Slow speech, Pause and replenish breath support for sentence/word. Vocal quality and volume improved post extubation. Pt will need f/u ST services at Discharge to address Motor Speech deficits; and a full Cognitive assessment to address awareness/attention for safety w/ ADLs. Pt has transitioned from HFNC to [place] at 10L w/ O2 sats 90% at rest this session; wbc and temps wnl.    Pt had finished a Bath w/ NSG. Noted O2 sats b/t 89-90% at rest; NSG acknowledged this as his baseline. Pt was positioned fully Upright w/ head forward. Education of aspiration precautions for all oral intake discussed. SLP explained and modeled general aspiration precautions; he agreed verbally to the need for following them especially sitting Upright for all oral intake and drinking liquids Slowly. Pt consumed trials of Single ice chips and Nectar liquids w/ no overt clinical s/s of aspiration noted; no decline in respiratory status, O2 sats 88-90%. RR low 20s. Pt exhibited adequate oral phase management the majority of trials, however, noted ease of distraction and hesitation/oral holding w/ ice chips x2 -- pt followed through w/ pharyngeal swallow/dry swallow given verbal cue by SLP. W/ trials of thin liquids via CUP, pt exhibited both immediate and delayed, overt Coughing(congested) x3/4 trials; thin liquids trials  stopped d/t risk for Pulmonary decline from aspiration. Suspect impact from lengthy oral intubation, and illness/Cognitive decline currently.   Recommend continue a Dysphagia level 3 diet (mech soft) w/ gravies added to moisten foods; Nectar liquids w/ monitoring for negative sequelae from aspiration. Recommend aspiration precautions; Pills Whole in Puree -  the puree providing cohesion for swallowing tablets. Tray setup and positioning assistance for meals; support w/ Feeding at this time. ST services will continue to f/u w/ pt for toleration of diet, trials to upgrade liquids in diet when appropriate, objective swallow assessment(mbss), and education while admitted. ST services for f/u at Discharge for Cognitive, Dysphonia assessments; dysphagia tx. NSG updated. Precautions posted at bedside.     HPI HPI: Pt is a 34 yo with OSA, morbid Obesity BMI>56 came in with flu like illness diagnosed with COVID19 pneumonia was initially ion Medical floor with hospitalist service initiated Remdesevir and barictitinib, he progressively became more dyspneic he was placed on BIPAP and did not tolerate then placed on HFNC did not tolerate patient himself consented to MV and was intubated in MICU. Mother was notified who is sick at home with COVID.  Pt was orally intubated on 05/05/2020; self-extubated on 05/20/2020.  Wears Bipap at night for support; O2 [place] during day.  Dysphonia, distractability requiring verbal cues currently.       SLP Plan  Continue with current plan of care       Recommendations  Diet recommendations: Dysphagia 3 (mechanical soft);Nectar-thick liquid Liquids provided via: Cup;Straw (monitor) Medication Administration: Whole meds with puree (as tolerates vs need to Crush) Supervision: Staff to assist with self feeding;Full supervision/cueing for compensatory strategies Compensations: Minimize environmental distractions;Slow rate;Small sips/bites;Lingual sweep for  clearance of pocketing;Follow  solids with liquid Postural Changes and/or Swallow Maneuvers: Seated upright 90 degrees;Upright 30-60 min after meal                General recommendations:  (Dietician f/u) Oral Care Recommendations: Oral care BID;Oral care before and after PO;Staff/trained caregiver to provide oral care Follow up Recommendations: Skilled Nursing facility;Inpatient Rehab;LTACH SLP Visit Diagnosis: Dysphagia, oropharyngeal phase (R13.12) Plan: Continue with current plan of care       GO                  Jerilynn Som, MS, CCC-SLP Speech Language Pathologist Rehab Services [phone] [facility] 05/26/2020, 10:58 AM

## 2020-05-26 NOTE — Progress Notes (Signed)
PHARMACIST - PHYSICIAN COMMUNICATION  CONCERNING:  Enoxaparin (Lovenox) for DVT Prophylaxis    RECOMMENDATION: Patient was prescribed enoxaprin 40mg  q24 hours for VTE prophylaxis.   Filed Weights   05/23/20 0500 05/24/20 0500 05/25/20 0500  Weight: (!) 172 kg (379 lb 3.1 oz) (!) 171.6 kg (378 lb 5 oz) (!) 171.2 kg (377 lb 6.8 oz)    Body mass index is 54.16 kg/m.  Estimated Creatinine Clearance: 208.6 mL/min (A) (by C-G formula based on SCr of 0.47 mg/dL (L)).   Based on [facility] policy patient is candidate for enoxaparin 0.5mg /kg TBW SQ every 24 hours based on BMI being >30.   DESCRIPTION: Pharmacy has adjusted enoxaparin dose per [facility] policy.  Patient is now receiving enoxaparin 85 mg every 24 hours    [facility], PharmD Clinical Pharmacist  05/26/2020 11:51 AM

## 2020-05-26 NOTE — Progress Notes (Addendum)
Progress Note    Collin Brown  HEN:[id number] DOB: September 12, 1985  DOA: 05/03/2020 PCP: Patient, No Pcp Per      Brief Narrative:    Medical records reviewed and are as summarized below:  Collin Brown is a 34 y.o. male with medical history significant for obstructive sleep apnea, morbid obesity, presented to the hospital on May 03, 2020 with cough, shortness of breath, generalized weakness, fever and a positive Covid 19 test. He was admitted to the medical floor for acute hypoxemic respiratory failure secondary to COVID-19 pneumonia. He was treated with IV remdesivir and IV steroids. Baricitinib was not given because of leukopenia.  His condition deteriorated and hypoxemia worsened. He was transferred to the ICU on May 05, 2020 where he was initially treated with BiPAP. However, he failed BiPAP therapy and so he was eventually intubated and placed on mechanical ventilation on May 05, 2020. He self extubated on May 20, 2020 was transitioned to BiPAP. Hospital course was complicated by acute metabolic encephalopathy and ARDS. His condition slowly improved and was transitioned to oxygenation via high flow nasal cannula. His level of care was downgraded from ICU status to stepdown status and the hospitalist team was consulted to continue with medical management.     Assessment/Plan:   Principal Problem:   Acute hypoxemic respiratory failure due to COVID-19 [facility]) Active Problems:   Sleep apnea   Pneumonia due to COVID-19 virus   Pressure injury of skin   Nutrition Problem: Inadequate oral intake Etiology: inability to eat  Signs/Symptoms: NPO status   Body mass index is 54.16 kg/m. (Morbid obesity)   COVID-19 pneumonia: Completed treatment with IV remdesivir and IV steroids. Baricitinib was not given because of leukopenia  Acute hypoxemic respiratory failure with ARDS: Self extubated on May 20, 2020. He is on 10 L/min oxygen via high flow nasal cannula. Continue to taper  down oxygen as able.  Type II DM: Hemoglobin A1c 7.2. Continue Levemir and Humalog as needed for hyperglycemia  Obstructive sleep apnea: Continue BiPAP at night  Continue Pepcid for GI prophylaxis  Continue Lovenox for DVT prophylaxis  Continue physical occupational therapy.  Patient is being considered for transfer to [facility] for further management.   Pressure Injury 05/12/20 Toe (Comment  which one) Anterior;Right Stage 2 -  Partial thickness loss of dermis presenting as a shallow open injury with a red, pink wound bed without slough. (Active)  05/12/20 1930  Location: Toe (Comment  which one)  Location Orientation: Anterior;Right  Staging: Stage 2 -  Partial thickness loss of dermis presenting as a shallow open injury with a red, pink wound bed without slough.  Wound Description (Comments):   Present on Admission: No     Pressure Injury 05/12/20 Toe (Comment  which one) Anterior;Left Stage 2 -  Partial thickness loss of dermis presenting as a shallow open injury with a red, pink wound bed without slough. (Active)  05/12/20 1930  Location: Toe (Comment  which one)  Location Orientation: Anterior;Left  Staging: Stage 2 -  Partial thickness loss of dermis presenting as a shallow open injury with a red, pink wound bed without slough.  Wound Description (Comments):   Present on Admission: No     Pressure Injury 05/21/20 Buttocks Right;Left purple/white (Active)  05/21/20 2123  Location: Buttocks  Location Orientation: Right;Left  Staging:   Wound Description (Comments): purple/white  Present on Admission:           Diet Order  DIET DYS 3 Room service appropriate? Yes with Assist; Fluid consistency: Nectar Thick  Diet effective now                    Consultants:  Intensivist  Procedures:  Intubation mechanical ventilation on May 05, 2020  Self extubated on May 20, 2020    Medications:   . vitamin C  500 mg Oral Daily  . aspirin  81 mg Oral  Daily  . chlorhexidine  15 mL Mouth Rinse BID  . Chlorhexidine Gluconate Cloth  6 each Topical Daily  . [START ON 05/27/2020] enoxaparin (LOVENOX) injection  0.5 mg/kg Subcutaneous Q24H  . famotidine  20 mg Oral Daily  . feeding supplement (NEPRO CARB STEADY)  237 mL Oral TID BM  . furosemide  40 mg Intravenous Daily  . insulin aspart  0-20 Units Subcutaneous Q4H  . insulin detemir  20 Units Subcutaneous BID  . mouth rinse  15 mL Mouth Rinse q12n4p  . multivitamin with minerals  1 tablet Oral Daily  . polyethylene glycol  17 g Oral Daily  . sodium chloride flush  10-40 mL Intracatheter Q12H   Continuous Infusions: . sodium chloride Stopped (05/22/20 0823)     Anti-infectives (From admission, onward)   Start     Dose/Rate Route Frequency Ordered Stop   05/16/20 1800  Ampicillin-Sulbactam (UNASYN) 3 g in sodium chloride 0.9 % 100 mL IVPB  Status:  Discontinued        3 g 200 mL/hr over 30 Minutes Intravenous Every 6 hours 05/16/20 1458 05/22/20 1008   05/14/20 0600  ceFEPIme (MAXIPIME) 2 g in sodium chloride 0.9 % 100 mL IVPB  Status:  Discontinued        2 g 200 mL/hr over 30 Minutes Intravenous Every 8 hours 05/14/20 0533 05/16/20 1458   05/07/20 1200  meropenem (MERREM) 1 g in sodium chloride 0.9 % 100 mL IVPB  Status:  Discontinued        1 g 200 mL/hr over 30 Minutes Intravenous Every 8 hours 05/07/20 1020 05/09/20 1019   05/06/20 1200  vancomycin (VANCOCIN) IVPB 1000 mg/200 mL premix  Status:  Discontinued        1,000 mg 200 mL/hr over 60 Minutes Intravenous Every 8 hours 05/05/20 2330 05/07/20 1020   05/06/20 0200  vancomycin (VANCOREADY) IVPB 500 mg/100 mL       "Followed by" Linked Group Details   500 mg 100 mL/hr over 60 Minutes Intravenous  Once 05/05/20 2330 05/06/20 0454   05/06/20 0000  vancomycin (VANCOREADY) IVPB 2000 mg/400 mL       "Followed by" Linked Group Details   2,000 mg 200 mL/hr over 120 Minutes Intravenous  Once 05/05/20 2330 05/06/20 0304   05/05/20  2315  cefTRIAXone (ROCEPHIN) 2 g in sodium chloride 0.9 % 100 mL IVPB  Status:  Discontinued        2 g 200 mL/hr over 30 Minutes Intravenous Daily at 10 pm 05/05/20 2307 05/07/20 1020   05/04/20 1000  remdesivir 100 mg in sodium chloride 0.9 % 100 mL IVPB       "Followed by" Linked Group Details   100 mg 200 mL/hr over 30 Minutes Intravenous Daily 05/03/20 1718 05/07/20 0941   05/03/20 1800  remdesivir 200 mg in sodium chloride 0.9% 250 mL IVPB       "Followed by" Linked Group Details   200 mg 580 mL/hr over 30 Minutes Intravenous Once 05/03/20 1718 05/03/20 1941  Family Communication/Anticipated D/C date and plan/Code Status   DVT prophylaxis: SCDs Start: 05/03/20 1737     Code Status: Full Code  Family Communication: None Disposition Plan:    Status is: Inpatient  Remains inpatient appropriate because:Inpatient level of care appropriate due to severity of illness   Dispo: The patient is from: Home              Anticipated d/c is to: LTAC              Anticipated d/c date is: 3 days              Patient currently is not medically stable to d/c.           Subjective:   Interval events noted. Patient is unable to provide any history at this time.  Objective:    Vitals:   05/26/20 0800 05/26/20 0825 05/26/20 0900 05/26/20 1000  BP: (!) 142/100  (!) 149/88   Pulse:  (!) 101 (!) 136 (!) 116  Resp: (!) 22 (!) 24 (!) 26 (!) 23  Temp:      TempSrc:      SpO2: 93% 91% (!) 79% (!) 89%  Weight:      Height:       No data found.   Intake/Output Summary (Last 24 hours) at 05/26/2020 1454 Last data filed at 05/26/2020 1400 Gross per 24 hour  Intake 840 ml  Output 1750 ml  Net -910 ml   Filed Weights   05/23/20 0500 05/24/20 0500 05/25/20 0500  Weight: (!) 172 kg (!) 171.6 kg (!) 171.2 kg    Exam:  GEN: NAD, morbidly obese SKIN: Warm and dry EYES: EOMI ENT: MMM CV: Regular rate and rhythm, tachycardic PULM: Decreased air entry  bilaterally. No wheezing or rales heard. ABD: soft, obese, NT, +BS CNS: AAO x 3, non focal EXT: No edema or tenderness GU: Foley catheter draining amber urine   Data Reviewed:   I have personally reviewed following labs and imaging studies:  Labs: Labs show the following:   Basic Metabolic Panel: Recent Labs  Lab 05/22/20 0525 05/22/20 0525 05/23/20 0615 05/23/20 0615 05/24/20 0535 05/24/20 0535 05/25/20 0345 05/26/20 0543  NA 148*  --  149*  --  143  --  144 141  K 3.2*   < > 3.5   < > 3.5   < > 3.6 4.0  CL 104  --  104  --  102  --  103 103  CO2 35*  --  36*  --  33*  --  28 29  GLUCOSE 129*  --  133*  --  128*  --  130* 154*  BUN 45*  --  41*  --  30*  --  26* 25*  CREATININE 0.69  --  0.75  --  0.65  --  0.54* 0.47*  CALCIUM 9.0  --  8.9  --  8.7*  --  8.6* 9.0  MG 2.4  --  2.3  --  2.2  --  2.1 2.2  PHOS 4.2  --  4.1  --  4.0  --  3.8 4.6   < > = values in this interval not displayed.   GFR Estimated Creatinine Clearance: 208.6 mL/min (A) (by C-G formula based on SCr of 0.47 mg/dL (L)). Liver Function Tests: No results for input(s): AST, ALT, ALKPHOS, BILITOT, PROT, ALBUMIN in the last 168 hours. No results for input(s): LIPASE, AMYLASE in the last 168 hours.  No results for input(s): AMMONIA in the last 168 hours. Coagulation profile No results for input(s): INR, PROTIME in the last 168 hours.  CBC: Recent Labs  Lab 05/22/20 0525 05/23/20 0615 05/24/20 0535 05/25/20 0345 05/26/20 0543  WBC 8.5 7.9 8.1 8.0 7.6  NEUTROABS 6.9 5.9 6.0 5.7 5.9  HGB 9.5* 9.7* 9.7* 9.7* 10.8*  HCT 31.2* 32.6* 31.5* 32.1* 34.7*  MCV 92.6 91.8 91.8 92.0 91.3  PLT 220 237 241 248 253   Cardiac Enzymes: No results for input(s): CKTOTAL, CKMB, CKMBINDEX, TROPONINI in the last 168 hours. BNP (last 3 results) No results for input(s): PROBNP in the last 8760 hours. CBG: Recent Labs  Lab 05/25/20 1931 05/25/20 2312 05/26/20 0410 05/26/20 0716 05/26/20 1303  GLUCAP 136*  129* 144* 141* 124*   D-Dimer: No results for input(s): DDIMER in the last 72 hours. Hgb A1c: No results for input(s): HGBA1C in the last 72 hours. Lipid Profile: No results for input(s): CHOL, HDL, LDLCALC, TRIG, CHOLHDL, LDLDIRECT in the last 72 hours. Thyroid function studies: No results for input(s): TSH, T4TOTAL, T3FREE, THYROIDAB in the last 72 hours.  Invalid input(s): FREET3 Anemia work up: No results for input(s): VITAMINB12, FOLATE, FERRITIN, TIBC, IRON, RETICCTPCT in the last 72 hours. Sepsis Labs: Recent Labs  Lab 05/23/20 0615 05/24/20 0535 05/25/20 0345 05/26/20 0543  WBC 7.9 8.1 8.0 7.6    Microbiology No results found for this or any previous visit (from the past 240 hour(s)).  Procedures and diagnostic studies:  DG Chest Port 1 View  Result Date: 05/25/2020 CLINICAL DATA:  COVID-19. EXAM: PORTABLE CHEST 1 VIEW COMPARISON:  May 18, 2020. FINDINGS: Stable cardiomediastinal silhouette. Endotracheal and nasogastric tubes have been removed. Hypoinflation of the lungs is noted. Increased bilateral patchy airspace opacities are noted concerning for multifocal pneumonia. No pneumothorax or pleural effusion is noted. Bony thorax is unremarkable. IMPRESSION: Increased bilateral patchy airspace opacities are noted concerning for worsening multifocal pneumonia. Endotracheal and nasogastric tubes have been removed. Electronically Signed   By: Lupita RaiderJames  Green Jr M.D.   On: 05/25/2020 10:40               LOS: 23 days   Reva Pinkley  [facility]   Pager on [email or web address]. If 7PM-7AM, please contact night-coverage at [email or web address]     05/26/2020, 2:54 PM

## 2020-05-26 NOTE — Progress Notes (Signed)
Chaplain provided supportive presence to Collin Brown and mother. Mother appears to have limited knowledged of LTAC while [name] has asked for this type of support. Mother asked for the possibility of visiting LTAC to learn more. She reports having a history to taking care of people yet is not realistic about the level of need Collin Brown has. Chaplain encouraged her to lean into professional experience to give him the most care he could receive to increase his recovery.     05/26/20 1500  Clinical Encounter Type  Visited With Patient and family together  Visit Type Follow-up  Referral From Nurse

## 2020-05-27 DIAGNOSIS — J1282 Pneumonia due to coronavirus disease 2019: Secondary | ICD-10-CM | POA: Diagnosis not present

## 2020-05-27 DIAGNOSIS — J9601 Acute respiratory failure with hypoxia: Secondary | ICD-10-CM | POA: Diagnosis not present

## 2020-05-27 DIAGNOSIS — U071 COVID-19: Secondary | ICD-10-CM | POA: Diagnosis not present

## 2020-05-27 LAB — CBC WITH DIFFERENTIAL/PLATELET
Abs Immature Granulocytes: 0.09 10*3/uL — ABNORMAL HIGH (ref 0.00–0.07)
Basophils Absolute: 0 10*3/uL (ref 0.0–0.1)
Basophils Relative: 0 %
Eosinophils Absolute: 0.2 10*3/uL (ref 0.0–0.5)
Eosinophils Relative: 4 %
HCT: 33.8 % — ABNORMAL LOW (ref 39.0–52.0)
Hemoglobin: 10.5 g/dL — ABNORMAL LOW (ref 13.0–17.0)
Immature Granulocytes: 1 %
Lymphocytes Relative: 19 %
Lymphs Abs: 1.2 10*3/uL (ref 0.7–4.0)
MCH: 28.5 pg (ref 26.0–34.0)
MCHC: 31.1 g/dL (ref 30.0–36.0)
MCV: 91.8 fL (ref 80.0–100.0)
Monocytes Absolute: 0.4 10*3/uL (ref 0.1–1.0)
Monocytes Relative: 6 %
Neutro Abs: 4.4 10*3/uL (ref 1.7–7.7)
Neutrophils Relative %: 70 %
Platelets: 263 10*3/uL (ref 150–400)
RBC: 3.68 MIL/uL — ABNORMAL LOW (ref 4.22–5.81)
RDW: 19.4 % — ABNORMAL HIGH (ref 11.5–15.5)
WBC: 6.4 10*3/uL (ref 4.0–10.5)
nRBC: 0.3 % — ABNORMAL HIGH (ref 0.0–0.2)

## 2020-05-27 LAB — BASIC METABOLIC PANEL
Anion gap: 9 (ref 5–15)
BUN: 28 mg/dL — ABNORMAL HIGH (ref 6–20)
CO2: 31 mmol/L (ref 22–32)
Calcium: 9 mg/dL (ref 8.9–10.3)
Chloride: 101 mmol/L (ref 98–111)
Creatinine, Ser: 0.6 mg/dL — ABNORMAL LOW (ref 0.61–1.24)
GFR, Estimated: >60 mL/min (ref >60)
Glucose, Bld: 131 mg/dL — ABNORMAL HIGH (ref 70–99)
Potassium: 3.6 mmol/L (ref 3.5–5.1)
Sodium: 141 mmol/L (ref 135–145)

## 2020-05-27 LAB — GLUCOSE, CAPILLARY
Glucose-Capillary: 130 mg/dL — ABNORMAL HIGH (ref 70–99)
Glucose-Capillary: 148 mg/dL — ABNORMAL HIGH (ref 70–99)
Glucose-Capillary: 141 mg/dL — ABNORMAL HIGH (ref 70–99)
Glucose-Capillary: 150 mg/dL — ABNORMAL HIGH (ref 70–99)
Glucose-Capillary: 158 mg/dL — ABNORMAL HIGH (ref 70–99)
Glucose-Capillary: 130 mg/dL — ABNORMAL HIGH (ref 70–99)

## 2020-05-27 LAB — PHOSPHORUS: Phosphorus: 4.8 mg/dL — ABNORMAL HIGH (ref 2.5–4.6)

## 2020-05-27 LAB — MAGNESIUM: Magnesium: 2.2 mg/dL (ref 1.7–2.4)

## 2020-05-27 NOTE — Progress Notes (Signed)
CH visited pt. very briefly as follow-up from visit w/pt.'s mother several weeks ago; pt. lying in bed, apparently very weak, limited in his ability or willingness to talk at this time.  CH let pt. know of chaplain availability if needed and told him his mother has been praying for him.

## 2020-05-27 NOTE — Plan of Care (Signed)

## 2020-05-27 NOTE — Progress Notes (Addendum)
Shift summary:  - 10 L HFNC by day, BiPAP by night.  - Orders to transfer to PCU today.  - Waiting for LTACH placement.   - Report given to Darral Dash, RN on 2A.

## 2020-05-27 NOTE — Progress Notes (Signed)
Progress Note    Collin Brown  JFH:[id number] DOB: 23-Jan-1986  DOA: 05/03/2020 PCP: Patient, No Pcp Per      Brief Narrative:    Medical records reviewed and are as summarized below:  Collin Brown is a 33 y.o. male with medical history significant for obstructive sleep apnea, morbid obesity, presented to the hospital on May 03, 2020 with cough, shortness of breath, generalized weakness, fever and a positive Covid 19 test. He was admitted to the medical floor for acute hypoxemic respiratory failure secondary to COVID-19 pneumonia. He was treated with IV remdesivir and IV steroids. Baricitinib was not given because of leukopenia.  His condition deteriorated and hypoxemia worsened. He was transferred to the ICU on May 05, 2020 where he was initially treated with BiPAP. However, he failed BiPAP therapy and so he was eventually intubated and placed on mechanical ventilation on May 05, 2020. He self extubated on May 20, 2020 was transitioned to BiPAP. Hospital course was complicated by acute metabolic encephalopathy and ARDS. His condition slowly improved and was transitioned to oxygenation via high flow nasal cannula. His level of care was downgraded from ICU status to stepdown status and the hospitalist team was consulted to continue with medical management.     Assessment/Plan:   Principal Problem:   Acute hypoxemic respiratory failure due to COVID-19 [facility]) Active Problems:   Sleep apnea   Pneumonia due to COVID-19 virus   Pressure injury of skin   Nutrition Problem: Inadequate oral intake Etiology: inability to eat  Signs/Symptoms: NPO status   Body mass index is 54.16 kg/m. (Morbid obesity)   COVID-19 pneumonia: Completed treatment with IV remdesivir and IV steroids.  Baricitinib was not given because of leukopenia.  Acute hypoxemic respiratory failure with ARDS: Self extubated on 05/20/2020.'s requirement is down to 7L/min high flow nasal cannula.  Continue to  taper down oxygen as able.  Type II DM: Hemoglobin A1c was 7.2.  Continue Levemir and Humalog as needed for hyperglycemia.  Obstructive sleep apnea: Continue BiPAP at night  Continue Pepcid for GI prophylaxis  Continue Lovenox for DVT prophylaxis  Continue PT and OT  Awaiting transfer to LTAC.   Pressure Injury 05/12/20 Toe (Comment  which one) Anterior;Right Stage 2 -  Partial thickness loss of dermis presenting as a shallow open injury with a red, pink wound bed without slough. (Active)  05/12/20 1930  Location: Toe (Comment  which one)  Location Orientation: Anterior;Right  Staging: Stage 2 -  Partial thickness loss of dermis presenting as a shallow open injury with a red, pink wound bed without slough.  Wound Description (Comments):   Present on Admission: No     Pressure Injury 05/12/20 Toe (Comment  which one) Anterior;Left Stage 2 -  Partial thickness loss of dermis presenting as a shallow open injury with a red, pink wound bed without slough. (Active)  05/12/20 1930  Location: Toe (Comment  which one)  Location Orientation: Anterior;Left  Staging: Stage 2 -  Partial thickness loss of dermis presenting as a shallow open injury with a red, pink wound bed without slough.  Wound Description (Comments):   Present on Admission: No     Pressure Injury 05/21/20 Buttocks Right;Left purple/white (Active)  05/21/20 2123  Location: Buttocks  Location Orientation: Right;Left  Staging:   Wound Description (Comments): purple/white  Present on Admission:           Diet Order  DIET DYS 3 Room service appropriate? Yes with Assist; Fluid consistency: Nectar Thick  Diet effective now                    Consultants:  Intensivist  Procedures:  Intubation mechanical ventilation on May 05, 2020  Self extubated on May 20, 2020    Medications:   . vitamin C  500 mg Oral Daily  . aspirin  81 mg Oral Daily  . chlorhexidine  15 mL Mouth Rinse BID  .  Chlorhexidine Gluconate Cloth  6 each Topical Daily  . enoxaparin (LOVENOX) injection  0.5 mg/kg Subcutaneous Q24H  . famotidine  20 mg Oral Daily  . feeding supplement (NEPRO CARB STEADY)  237 mL Oral TID BM  . furosemide  40 mg Intravenous Daily  . insulin aspart  0-20 Units Subcutaneous Q4H  . insulin detemir  20 Units Subcutaneous BID  . mouth rinse  15 mL Mouth Rinse q12n4p  . multivitamin with minerals  1 tablet Oral Daily  . polyethylene glycol  17 g Oral Daily  . sodium chloride flush  10-40 mL Intracatheter Q12H   Continuous Infusions: . sodium chloride Stopped (05/22/20 0823)     Anti-infectives (From admission, onward)   Start     Dose/Rate Route Frequency Ordered Stop   05/16/20 1800  Ampicillin-Sulbactam (UNASYN) 3 g in sodium chloride 0.9 % 100 mL IVPB  Status:  Discontinued        3 g 200 mL/hr over 30 Minutes Intravenous Every 6 hours 05/16/20 1458 05/22/20 1008   05/14/20 0600  ceFEPIme (MAXIPIME) 2 g in sodium chloride 0.9 % 100 mL IVPB  Status:  Discontinued        2 g 200 mL/hr over 30 Minutes Intravenous Every 8 hours 05/14/20 0533 05/16/20 1458   05/07/20 1200  meropenem (MERREM) 1 g in sodium chloride 0.9 % 100 mL IVPB  Status:  Discontinued        1 g 200 mL/hr over 30 Minutes Intravenous Every 8 hours 05/07/20 1020 05/09/20 1019   05/06/20 1200  vancomycin (VANCOCIN) IVPB 1000 mg/200 mL premix  Status:  Discontinued        1,000 mg 200 mL/hr over 60 Minutes Intravenous Every 8 hours 05/05/20 2330 05/07/20 1020   05/06/20 0200  vancomycin (VANCOREADY) IVPB 500 mg/100 mL       "Followed by" Linked Group Details   500 mg 100 mL/hr over 60 Minutes Intravenous  Once 05/05/20 2330 05/06/20 0454   05/06/20 0000  vancomycin (VANCOREADY) IVPB 2000 mg/400 mL       "Followed by" Linked Group Details   2,000 mg 200 mL/hr over 120 Minutes Intravenous  Once 05/05/20 2330 05/06/20 0304   05/05/20 2315  cefTRIAXone (ROCEPHIN) 2 g in sodium chloride 0.9 % 100 mL IVPB   Status:  Discontinued        2 g 200 mL/hr over 30 Minutes Intravenous Daily at 10 pm 05/05/20 2307 05/07/20 1020   05/04/20 1000  remdesivir 100 mg in sodium chloride 0.9 % 100 mL IVPB       "Followed by" Linked Group Details   100 mg 200 mL/hr over 30 Minutes Intravenous Daily 05/03/20 1718 05/07/20 0941   05/03/20 1800  remdesivir 200 mg in sodium chloride 0.9% 250 mL IVPB       "Followed by" Linked Group Details   200 mg 580 mL/hr over 30 Minutes Intravenous Once 05/03/20 1718 05/03/20 1941  Family Communication/Anticipated D/C date and plan/Code Status   DVT prophylaxis: SCDs Start: 05/03/20 1737     Code Status: Full Code  Family Communication: None Disposition Plan:    Status is: Inpatient  Remains inpatient appropriate because:Inpatient level of care appropriate due to severity of illness   Dispo: The patient is from: Home              Anticipated d/c is to: LTAC              Anticipated d/c date is: 3 days              Patient currently is not medically stable to d/c.           Subjective:   He complains of generalized weakness and fatigue.  No chest pain or shortness of breath.  Objective:    Vitals:   05/27/20 1200 05/27/20 1204 05/27/20 1207 05/27/20 1300  BP: (!) 128/111   (!) 147/98  Pulse:    (!) 108  Resp: (!) 25   (!) 23  Temp: 99 F (37.2 C)     TempSrc: Axillary     SpO2: 99% 99% 91% 92%  Weight:      Height:       No data found.   Intake/Output Summary (Last 24 hours) at 05/27/2020 1359 Last data filed at 05/27/2020 1300 Gross per 24 hour  Intake 1150 ml  Output 3500 ml  Net -2350 ml   Filed Weights   05/23/20 0500 05/24/20 0500 05/25/20 0500  Weight: (!) 172 kg (!) 171.6 kg (!) 171.2 kg    Exam:   GEN: NAD SKIN: No rash EYES: EOMI ENT: MMM CV: RRR, tachycardic PULM: CTA B ABD: soft, obese, NT, +BS CNS: AAO x 3, non focal EXT: No edema or tenderness GU: Foley catheter draining amber  urine     Data Reviewed:   I have personally reviewed following labs and imaging studies:  Labs: Labs show the following:   Basic Metabolic Panel: Recent Labs  Lab 05/23/20 0615 05/23/20 0615 05/24/20 0535 05/24/20 0535 05/25/20 0345 05/25/20 0345 05/26/20 0543 05/27/20 0452  NA 149*  --  143  --  144  --  141 141  K 3.5   < > 3.5   < > 3.6   < > 4.0 3.6  CL 104  --  102  --  103  --  103 101  CO2 36*  --  33*  --  28  --  29 31  GLUCOSE 133*  --  128*  --  130*  --  154* 131*  BUN 41*  --  30*  --  26*  --  25* 28*  CREATININE 0.75  --  0.65  --  0.54*  --  0.47* 0.60*  CALCIUM 8.9  --  8.7*  --  8.6*  --  9.0 9.0  MG 2.3  --  2.2  --  2.1  --  2.2 2.2  PHOS 4.1  --  4.0  --  3.8  --  4.6 4.8*   < > = values in this interval not displayed.   GFR Estimated Creatinine Clearance: 208.6 mL/min (A) (by C-G formula based on SCr of 0.6 mg/dL (L)). Liver Function Tests: No results for input(s): AST, ALT, ALKPHOS, BILITOT, PROT, ALBUMIN in the last 168 hours. No results for input(s): LIPASE, AMYLASE in the last 168 hours. No results for input(s): AMMONIA in the last 168 hours. Coagulation  profile No results for input(s): INR, PROTIME in the last 168 hours.  CBC: Recent Labs  Lab 05/23/20 0615 05/24/20 0535 05/25/20 0345 05/26/20 0543 05/27/20 0452  WBC 7.9 8.1 8.0 7.6 6.4  NEUTROABS 5.9 6.0 5.7 5.9 4.4  HGB 9.7* 9.7* 9.7* 10.8* 10.5*  HCT 32.6* 31.5* 32.1* 34.7* 33.8*  MCV 91.8 91.8 92.0 91.3 91.8  PLT 237 241 248 253 263   Cardiac Enzymes: No results for input(s): CKTOTAL, CKMB, CKMBINDEX, TROPONINI in the last 168 hours. BNP (last 3 results) No results for input(s): PROBNP in the last 8760 hours. CBG: Recent Labs  Lab 05/26/20 2145 05/26/20 2317 05/27/20 0355 05/27/20 0720 05/27/20 1256  GLUCAP 164* 148* 130* 148* 141*   D-Dimer: No results for input(s): DDIMER in the last 72 hours. Hgb A1c: No results for input(s): HGBA1C in the last 72  hours. Lipid Profile: No results for input(s): CHOL, HDL, LDLCALC, TRIG, CHOLHDL, LDLDIRECT in the last 72 hours. Thyroid function studies: No results for input(s): TSH, T4TOTAL, T3FREE, THYROIDAB in the last 72 hours.  Invalid input(s): FREET3 Anemia work up: No results for input(s): VITAMINB12, FOLATE, FERRITIN, TIBC, IRON, RETICCTPCT in the last 72 hours. Sepsis Labs: Recent Labs  Lab 05/24/20 0535 05/25/20 0345 05/26/20 0543 05/27/20 0452  WBC 8.1 8.0 7.6 6.4    Microbiology No results found for this or any previous visit (from the past 240 hour(s)).  Procedures and diagnostic studies:  DG Shoulder Left  Result Date: 05/26/2020 CLINICAL DATA:  34 year old male with left shoulder pain EXAM: LEFT SHOULDER - 2+ VIEW COMPARISON:  Chest radiograph from 05/18/2020 FINDINGS: There is no evidence of fracture or dislocation. There is no evidence of arthropathy or other focal bone abnormality. Soft tissues are unremarkable. IMPRESSION: No acute fracture or malalignment. Electronically Signed   By: Marliss Coots MD   On: 05/26/2020 15:57               LOS: 24 days   Collin Brown  [facility]   Pager on [email or web address]. If 7PM-7AM, please contact night-coverage at [email or web address]     05/27/2020, 1:59 PM

## 2020-05-27 NOTE — Progress Notes (Signed)
CRITICAL CARE PROGRESS NOTE    Name: Collin Brown MRN: [id number] DOB: [birth year]     LOS: 24   SUBJECTIVE FINDINGS & SIGNIFICANT EVENTS    Patient description:  34 yo with OSA, morbid obesity BMI>56 came in with flu like illness diagnosed with COVID19 pneumonia was initially ion Medical floor with hospitalist service initiated Remdesevir and barictitinib, he progressively became more dyspneic he was placed on BIPAP and did not tolerate then placed on HFNC did not tolerate patient himself consented to MV and was intubated in MICU. Mother was notified who is sick at home with COVID.   Events:  9/27- patient on 65%FiO2 with proning protocol. CVP trending, goal negative fluid balance.   05/08/20- patient with worsening ventilation increased FiO2 to 100% overnight and had episode of severe bradycardia.  Concern for mucus plugging will perform recruitment maneuvers and possible bronchoscopy.  We were able to perform tracheal aspirate and have weaned down fIO2 to 75% 05/09/20- patient had bradycardic episode and he desaturated overnight with temporary increase to 100FiO2.  Lasix q12 going. I spoke to brother today for update.  His tracheal aspirate came back normal flora so we will stop antibiotic.  05/10/20-patient with pneumomediastinum and on maximal setting on ventilator. Poor prognosis very unfortunate young man. I called and updated Mother today Collin Brown.  05/11/20- patient weaned to 80%. Will continue with weaning and proning protocol  05/12/20- patient remains critically ill.  Mother at bedside we discussed his care plan, she is with strong faith and continues to pray for her son to improve.  05/13/20- patient remains crtically ill.  Proning per protocol. No significant events overnight.  05/14/20- Continues to be  febrile, FiO2 up to 100%, improved down to 60% after increasing PEEP from 10 to 16.  05/15/20- patient remains crtically ill, overall status unchanged 05/16/20-persistent fevers, CT head and chest, does have sinus opacification. 10/7 severe ARDS 10/8 febrile, severe ARDS 10/9 severe ARDS 10/10 self extubated, placed on biPAP 10/11 on biPAP tolerating well, precedex 10/12 on biPAP last night 10/13 biPAP last night for OSA 05/25/20- patient is awake and alert, he is able to respond to verbal communication but is with encephalopathy and slow to communicate.  Weaned HFNC down today per RT. Signing out to [facility] for am pick up.  05/26/20- Patient reports left shoulder pain, will obtain L shoulder DG.  Mentation slowly improving.  He is edematous and does have episodic desaturation of sPO2.  05/27/20-  Patient reports hunger, improvement in shoulder pain overnight. Optimizing for transfer to medsurg. Discussed care plan with Dr Myriam Forehand today.    Lines/tubes  9/25 - Left IJ placed  : Airway 8 mm (Active)  Secured at (cm) 26 cm 05/07/20 0801  Measured From Lips 05/07/20 0801  Secured Location Center 05/07/20 0801  Secured By [name] 05/07/20 0801  Tube Holder Repositioned Yes 05/07/20 0801  Cuff Pressure (cm H2O) 26 cm H2O 05/07/20 0801  Site Condition Dry 05/07/20 0801     CVC Triple Lumen 05/05/20 Left Internal jugular (Active)  Indication for Insertion or Continuance of Line Vasoactive infusions;Poor Vasculature-patient has had multiple peripheral attempts or PIVs lasting less than 24 hours 05/06/20 2130  Site Assessment Clean;Dry;Intact 05/06/20 2130  Proximal Lumen Status Infusing 05/06/20 2130  Medial Lumen Status Infusing 05/06/20 2130  Distal Lumen Status Infusing 05/06/20 2130  Dressing Type Transparent;Occlusive 05/06/20 2130  Dressing Status Clean;Dry;Intact 05/06/20 2130  Antimicrobial disc in place? Yes 05/06/20 2130  Line Care  Connections checked and tightened  05/06/20 2130  Dressing Intervention Dressing reinforced 05/06/20 0900  Dressing Change Due 05/12/20 05/06/20 2130     NG/OG Tube Orogastric Center mouth Xray 65 cm (Active)  Cm Marking at Nare/Corner of Mouth (if applicable) 65 cm 05/06/20 2130  Site Assessment Clean;Dry;Intact 05/06/20 2130  Ongoing Placement Verification No change in cm markings or external length of tube from initial placement;No change in respiratory status;No acute changes, not attributed to clinical condition;Xray 05/06/20 2130  Status Clamped 05/06/20 2130     Urethral Catheter Waynard EdwardsLia H, RN 16 Fr. (Active)  Indication for Insertion or Continuance of Catheter Unstable critically ill patients first 24-48 hours (See Criteria) 05/06/20 2000  Site Assessment Clean;Intact;Prolapse;Swelling 05/06/20 2000  Catheter Maintenance Bag below level of bladder;Catheter secured;Drainage bag/tubing not touching floor;Insertion date on drainage bag;No dependent loops;Seal intact 05/06/20 2000  Collection Container Standard drainage bag 05/06/20 2000  Securement Method Securing device (Describe) 05/06/20 2000  Urinary Catheter Interventions (if applicable) Unclamped 05/06/20 2000  Output (mL) 100 mL 05/07/20 0445    Microbiology/Sepsis markers: Results for orders placed or performed during the hospital encounter of 05/03/20  Blood Culture (routine x 2)     Status: None   Collection Time: 05/03/20  4:33 PM   Specimen: BLOOD  Result Value Ref Range Status   Specimen Description BLOOD BLOOD LEFT HAND  Final   Special Requests   Final    BOTTLES DRAWN AEROBIC AND ANAEROBIC Blood Culture results may not be optimal due to an inadequate volume of blood received in culture bottles   Culture   Final    NO GROWTH 5 DAYS Performed at [facility], [address] [place], [place] [address]    Report Status 05/08/2020 FINAL  Final  Blood Culture (routine x 2)     Status: None   Collection Time: 05/03/20  4:33 PM   Specimen: BLOOD   Result Value Ref Range Status   Specimen Description BLOOD BLOOD RIGHT HAND  Final   Special Requests   Final    BOTTLES DRAWN AEROBIC AND ANAEROBIC Blood Culture adequate volume   Culture   Final    NO GROWTH 5 DAYS Performed at [facility], [address] [place], [place] [address]    Report Status 05/08/2020 FINAL  Final  Respiratory Panel by RT PCR (Flu A&B, Covid) -     Status: Abnormal   Collection Time: 05/03/20  7:32 PM  Result Value Ref Range Status   SARS Coronavirus 2 by RT PCR POSITIVE (A) NEGATIVE Final    Comment: RESULT CALLED TO, READ BACK BY AND VERIFIED WITH: Benson NorwaySTEPHEN JONES RN 2114 05/03/20 HNM/AR    Influenza A by PCR NEGATIVE NEGATIVE Final   Influenza B by PCR NEGATIVE NEGATIVE Final    Comment: Performed at [facility], [address] [place], [place] [address]  MRSA PCR Screening     Status: None   Collection Time: 05/06/20 11:51 AM   Specimen: Nasal Mucosa; Nasopharyngeal  Result Value Ref Range Status   MRSA by PCR NEGATIVE NEGATIVE Final    Comment:        The GeneXpert MRSA Assay (FDA approved for NASAL specimens only), is one component of a comprehensive MRSA colonization surveillance program. It is not intended to diagnose MRSA infection nor to guide or monitor treatment for MRSA infections. Performed at [facility], [address] [place], [place] [address]   Culture, respiratory (non-expectorated)     Status:  None   Collection Time: 05/07/20 10:55 AM   Specimen: Tracheal Aspirate; Respiratory  Result Value Ref Range Status   Specimen Description   Final    TRACHEAL ASPIRATE Performed at [facility], [address], [place], [place] [address]    Special Requests   Final    NONE Performed at [facility], [address] [place], [place] [address]    Gram Stain   Final    RARE WBC PRESENT,BOTH PMN AND MONONUCLEAR NO ORGANISMS SEEN    Culture   Final    RARE Normal respiratory  flora-no Staph aureus or Pseudomonas seen Performed at [facility], [address], [place], [place] [address]    Report Status 05/09/2020 FINAL  Final  CULTURE, BLOOD (ROUTINE X 2) w Reflex to ID Panel     Status: None   Collection Time: 05/10/20 12:47 PM   Specimen: BLOOD  Result Value Ref Range Status   Specimen Description BLOOD BLOOD RIGHT HAND  Final   Special Requests   Final    BOTTLES DRAWN AEROBIC AND ANAEROBIC Blood Culture adequate volume   Culture   Final    NO GROWTH 5 DAYS Performed at [facility], [address] [place], [place] [address]    Report Status 05/15/2020 FINAL  Final  CULTURE, BLOOD (ROUTINE X 2) w Reflex to ID Panel     Status: None   Collection Time: 05/10/20  1:21 PM   Specimen: BLOOD  Result Value Ref Range Status   Specimen Description BLOOD BLOOD RIGHT HAND  Final   Special Requests   Final    BOTTLES DRAWN AEROBIC AND ANAEROBIC Blood Culture adequate volume   Culture   Final    NO GROWTH 5 DAYS Performed at [facility], [address], [place], [place] [address]    Report Status 05/15/2020 FINAL  Final  Culture, respiratory (non-expectorated)     Status: None   Collection Time: 05/14/20  8:58 AM   Specimen: Tracheal Aspirate; Respiratory  Result Value Ref Range Status   Specimen Description   Final    TRACHEAL ASPIRATE Performed at [facility], [address], [place], [place] [address]    Special Requests   Final    NONE Performed at [facility], [address] [place], [place] [address]    Gram Stain   Final    RARE WBC PRESENT,BOTH PMN AND MONONUCLEAR NO ORGANISMS SEEN    Culture   Final    RARE Normal respiratory flora-no Staph aureus or Pseudomonas seen Performed at [facility], [address] [place], [place] [address]    Report Status 05/16/2020 FINAL  Final  CULTURE, BLOOD (ROUTINE X 2) w Reflex to ID Panel     Status: None   Collection Time: 05/14/20  9:33 AM    Specimen: BLOOD LEFT HAND  Result Value Ref Range Status   Specimen Description BLOOD LEFT HAND  Final   Special Requests   Final    BOTTLES DRAWN AEROBIC AND ANAEROBIC Blood Culture adequate volume   Culture   Final    NO GROWTH 7 DAYS Performed at [facility], [address] [place], [place] [address]    Report Status 05/21/2020 FINAL  Final  CULTURE, BLOOD (ROUTINE X 2) w Reflex to ID Panel     Status: None   Collection Time: 05/14/20 10:17 AM   Specimen: BLOOD LEFT HAND  Result Value Ref Range Status  Specimen Description BLOOD LEFT HAND  Final   Special Requests   Final    BOTTLES DRAWN AEROBIC AND ANAEROBIC Blood Culture adequate volume   Culture   Final    NO GROWTH 7 DAYS Performed at [facility], [address] [place], [place] [address]    Report Status 05/21/2020 FINAL  Final  Urine Culture     Status: None   Collection Time: 05/14/20  2:33 PM   Specimen: Urine, Random  Result Value Ref Range Status   Specimen Description   Final    URINE, RANDOM Performed at [facility], [address] [place], [place] [address]    Special Requests   Final    NONE Performed at [facility], [address], [place], [place] [address]    Culture   Final    NO GROWTH Performed at [facility], [address], [place], [place] [address]    Report Status 05/15/2020 FINAL  Final    Anti-infectives:  Anti-infectives (From admission, onward)   Start     Dose/Rate Route Frequency Ordered Stop   05/16/20 1800  Ampicillin-Sulbactam (UNASYN) 3 g in sodium chloride 0.9 % 100 mL IVPB  Status:  Discontinued        3 g 200 mL/hr over 30 Minutes Intravenous Every 6 hours 05/16/20 1458 05/22/20 1008   05/14/20 0600  ceFEPIme (MAXIPIME) 2 g in sodium chloride 0.9 % 100 mL IVPB  Status:  Discontinued        2 g 200 mL/hr over 30 Minutes Intravenous Every 8 hours 05/14/20 0533 05/16/20 1458   05/07/20 1200  meropenem (MERREM) 1 g in sodium  chloride 0.9 % 100 mL IVPB  Status:  Discontinued        1 g 200 mL/hr over 30 Minutes Intravenous Every 8 hours 05/07/20 1020 05/09/20 1019   05/06/20 1200  vancomycin (VANCOCIN) IVPB 1000 mg/200 mL premix  Status:  Discontinued        1,000 mg 200 mL/hr over 60 Minutes Intravenous Every 8 hours 05/05/20 2330 05/07/20 1020   05/06/20 0200  vancomycin (VANCOREADY) IVPB 500 mg/100 mL       "Followed by" Linked Group Details   500 mg 100 mL/hr over 60 Minutes Intravenous  Once 05/05/20 2330 05/06/20 0454   05/06/20 0000  vancomycin (VANCOREADY) IVPB 2000 mg/400 mL       "Followed by" Linked Group Details   2,000 mg 200 mL/hr over 120 Minutes Intravenous  Once 05/05/20 2330 05/06/20 0304   05/05/20 2315  cefTRIAXone (ROCEPHIN) 2 g in sodium chloride 0.9 % 100 mL IVPB  Status:  Discontinued        2 g 200 mL/hr over 30 Minutes Intravenous Daily at 10 pm 05/05/20 2307 05/07/20 1020   05/04/20 1000  remdesivir 100 mg in sodium chloride 0.9 % 100 mL IVPB       "Followed by" Linked Group Details   100 mg 200 mL/hr over 30 Minutes Intravenous Daily 05/03/20 1718 05/07/20 0941   05/03/20 1800  remdesivir 200 mg in sodium chloride 0.9% 250 mL IVPB       "Followed by" Linked Group Details   200 mg 580 mL/hr over 30 Minutes Intravenous Once 05/03/20 1718 05/03/20 1941       Consults: Treatment Team:  Pccm, Raymond Gurney, MD     PAST MEDICAL HISTORY   Past Medical History:  Diagnosis Date  . Hearing loss in right ear   .  Sleep apnea      SURGICAL HISTORY   Past Surgical History:  Procedure Laterality Date  . TONSILLECTOMY       FAMILY HISTORY   Family History  Problem Relation Age of Onset  . Diabetes Mellitus II Mother   . Hypertension Mother   . Diabetes Mellitus II Father   . High blood pressure Father      SOCIAL HISTORY   Social History   Tobacco Use  . Smoking status: Never Smoker  . Smokeless tobacco: Never Used  Vaping Use  . Vaping Use: Never used   Substance Use Topics  . Alcohol use: No  . Drug use: No     MEDICATIONS   Current Medication:  Current Facility-Administered Medications:  .  0.9 %  sodium chloride infusion, , Intravenous, PRN, Staci Acosta, MD, Stopped at 05/22/20 9562 .  acetaminophen (TYLENOL) tablet 1,000 mg, 1,000 mg, Oral, Q6H PRN, Manuela Schwartz, NP, 1,000 mg at 05/19/20 0903 .  ascorbic acid (VITAMIN C) tablet 500 mg, 500 mg, Oral, Daily, Kasa, Kurian, MD, 500 mg at 05/26/20 1257 .  aspirin chewable tablet 81 mg, 81 mg, Oral, Daily, Kasa, Kurian, MD, 81 mg at 05/26/20 1257 .  chlorhexidine (PERIDEX) 0.12 % solution 15 mL, 15 mL, Mouth Rinse, BID, Kasa, Kurian, MD, 15 mL at 05/26/20 1010 .  Chlorhexidine Gluconate Cloth 2 % PADS 6 each, 6 each, Topical, Daily, Salena Saner, MD, 6 each at 05/26/20 1010 .  dextrose 50 % solution 0-50 mL, 0-50 mL, Intravenous, PRN, Karna Christmas, Caitlynne Harbeck, MD .  enoxaparin (LOVENOX) injection 85 mg, 0.5 mg/kg, Subcutaneous, Q24H, Lurene Shadow, MD .  famotidine (PEPCID) tablet 20 mg, 20 mg, Oral, Daily, Kasa, Kurian, MD, 20 mg at 05/26/20 1258 .  feeding supplement (NEPRO CARB STEADY) liquid 237 mL, 237 mL, Oral, TID BM, Kasa, Kurian, MD, 237 mL at 05/26/20 2015 .  furosemide (LASIX) injection 40 mg, 40 mg, Intravenous, Daily, Karna Christmas, Icel Castles, MD, 40 mg at 05/26/20 1300 .  insulin aspart (novoLOG) injection 0-20 Units, 0-20 Units, Subcutaneous, Q4H, Lowella Bandy, RPH, 3 Units at 05/27/20 1308 .  insulin detemir (LEVEMIR) injection 20 Units, 20 Units, Subcutaneous, BID, Judithe Modest, NP, 20 Units at 05/26/20 2145 .  MEDLINE mouth rinse, 15 mL, Mouth Rinse, q12n4p, Kasa, Kurian, MD, 15 mL at 05/26/20 1230 .  morphine 2 MG/ML injection 1 mg, 1 mg, Intravenous, Q4H PRN, Vida Rigger, MD, 1 mg at 05/27/20 0534 .  multivitamin with minerals tablet 1 tablet, 1 tablet, Oral, Daily, Erin Fulling, MD, 1 tablet at 05/26/20 1257 .  polyethylene glycol (MIRALAX / GLYCOLAX) packet  17 g, 17 g, Oral, Daily, Kasa, Kurian, MD, 17 g at 05/26/20 1258 .  sodium chloride flush (NS) 0.9 % injection 10-40 mL, 10-40 mL, Intracatheter, Q12H, Kasa, Kurian, MD, 10 mL at 05/26/20 2147 .  sodium chloride flush (NS) 0.9 % injection 10-40 mL, 10-40 mL, Intracatheter, PRN, Erin Fulling, MD    ALLERGIES   Patient has no known allergies.    REVIEW OF SYSTEMS    10 point ROS done and is negative except mild Left shoulder pain and hunger and dry cough  PHYSICAL EXAMINATION   Vital Signs: Temp:  [99.2 F (37.3 C)-100.3 F (37.9 C)] 99.2 F (37.3 C) (10/17 0740) Pulse Rate:  [96-138] 111 (10/17 0800) Resp:  [0-34] 23 (10/17 0800) BP: (111-182)/(74-115) 158/99 (10/17 0800) SpO2:  [79 %-100 %] 89 % (10/17 0800) FiO2 (%):  [65 %] 65 % (  10/17 0100)  GENERAL:NAD HEAD: Normocephalic, atraumatic.  EYES: Pupils equal, round, reactive to light.  No scleral icterus.  MOUTH: Moist mucosal membrane. NECK: Supple. No thyromegaly. No nodules. No JVD.  PULMONARY: rhonchi bilaterally  CARDIOVASCULAR: S1 and S2. Regular rate and rhythm. No murmurs, rubs, or gallops.  GASTROINTESTINAL: Soft, nontender, non-distended. No masses. Positive bowel sounds. No hepatosplenomegaly.  MUSCULOSKELETAL: No swelling, clubbing, or edema.  NEUROLOGIC: Mild distress due to acute illness, +confusion  SKIN:intact,warm,dry   PERTINENT DATA     Infusions: . sodium chloride Stopped (05/22/20 1610)   Scheduled Medications: . vitamin C  500 mg Oral Daily  . aspirin  81 mg Oral Daily  . chlorhexidine  15 mL Mouth Rinse BID  . Chlorhexidine Gluconate Cloth  6 each Topical Daily  . enoxaparin (LOVENOX) injection  0.5 mg/kg Subcutaneous Q24H  . famotidine  20 mg Oral Daily  . feeding supplement (NEPRO CARB STEADY)  237 mL Oral TID BM  . furosemide  40 mg Intravenous Daily  . insulin aspart  0-20 Units Subcutaneous Q4H  . insulin detemir  20 Units Subcutaneous BID  . mouth rinse  15 mL Mouth Rinse  q12n4p  . multivitamin with minerals  1 tablet Oral Daily  . polyethylene glycol  17 g Oral Daily  . sodium chloride flush  10-40 mL Intracatheter Q12H   PRN Medications: sodium chloride, acetaminophen, dextrose, morphine injection, sodium chloride flush Hemodynamic parameters:   Intake/Output: 10/16 0701 - 10/17 0700 In: 1250 [P.O.:1200; I.V.:50] Out: 2000 [Urine:2000]  Ventilator  Settings: FiO2 (%):  [65 %] 65 %   Other Labs:     LAB RESULTS:  Basic Metabolic Panel: Recent Labs  Lab 05/23/20 0615 05/23/20 0615 05/24/20 0535 05/24/20 0535 05/25/20 0345 05/25/20 0345 05/26/20 0543 05/27/20 0452  NA 149*  --  143  --  144  --  141 141  K 3.5   < > 3.5   < > 3.6   < > 4.0 3.6  CL 104  --  102  --  103  --  103 101  CO2 36*  --  33*  --  28  --  29 31  GLUCOSE 133*  --  128*  --  130*  --  154* 131*  BUN 41*  --  30*  --  26*  --  25* 28*  CREATININE 0.75  --  0.65  --  0.54*  --  0.47* 0.60*  CALCIUM 8.9  --  8.7*  --  8.6*  --  9.0 9.0  MG 2.3  --  2.2  --  2.1  --  2.2 2.2  PHOS 4.1  --  4.0  --  3.8  --  4.6 4.8*   < > = values in this interval not displayed.   Liver Function Tests: No results for input(s): AST, ALT, ALKPHOS, BILITOT, PROT, ALBUMIN in the last 168 hours. No results for input(s): LIPASE, AMYLASE in the last 168 hours. No results for input(s): AMMONIA in the last 168 hours. CBC: Recent Labs  Lab 05/23/20 0615 05/24/20 0535 05/25/20 0345 05/26/20 0543 05/27/20 0452  WBC 7.9 8.1 8.0 7.6 6.4  NEUTROABS 5.9 6.0 5.7 5.9 4.4  HGB 9.7* 9.7* 9.7* 10.8* 10.5*  HCT 32.6* 31.5* 32.1* 34.7* 33.8*  MCV 91.8 91.8 92.0 91.3 91.8  PLT 237 241 248 253 263   Cardiac Enzymes: No results for input(s): CKTOTAL, CKMB, CKMBINDEX, TROPONINI in the last 168 hours. BNP: Invalid input(s): POCBNP CBG:  Recent Labs  Lab 05/26/20 1953 05/26/20 2145 05/26/20 2317 05/27/20 0355 05/27/20 0720  GLUCAP 157* 164* 148* 130* 148*       IMAGING  RESULTS:  Imaging: DG Chest Port 1 View  Result Date: 05/25/2020 CLINICAL DATA:  COVID-19. EXAM: PORTABLE CHEST 1 VIEW COMPARISON:  May 18, 2020. FINDINGS: Stable cardiomediastinal silhouette. Endotracheal and nasogastric tubes have been removed. Hypoinflation of the lungs is noted. Increased bilateral patchy airspace opacities are noted concerning for multifocal pneumonia. No pneumothorax or pleural effusion is noted. Bony thorax is unremarkable. IMPRESSION: Increased bilateral patchy airspace opacities are noted concerning for worsening multifocal pneumonia. Endotracheal and nasogastric tubes have been removed. Electronically Signed   By: Lupita Raider M.D.   On: 05/25/2020 10:40   DG Shoulder Left  Result Date: 05/26/2020 CLINICAL DATA:  34 year old male with left shoulder pain EXAM: LEFT SHOULDER - 2+ VIEW COMPARISON:  Chest radiograph from 05/18/2020 FINDINGS: There is no evidence of fracture or dislocation. There is no evidence of arthropathy or other focal bone abnormality. Soft tissues are unremarkable. IMPRESSION: No acute fracture or malalignment. Electronically Signed   By: Marliss Coots MD   On: 05/26/2020 15:57   @ DG Shoulder Left  Result Date: 05/26/2020 CLINICAL DATA:  34 year old male with left shoulder pain EXAM: LEFT SHOULDER - 2+ VIEW COMPARISON:  Chest radiograph from 05/18/2020 FINDINGS: There is no evidence of fracture or dislocation. There is no evidence of arthropathy or other focal bone abnormality. Soft tissues are unremarkable. IMPRESSION: No acute fracture or malalignment. Electronically Signed   By: Marliss Coots MD   On: 05/26/2020 15:57       ASSESSMENT AND PLAN    -Multidisciplinary rounds held today  Acute hypoxic respiratory failure due to COVID-19 ARDS COVID-19 pneumonia Underlying morbid obesity Mechanical intubation -s/p Self extubation- now on HFNC-down to 4L/min - Aggressive BPH  - PT  - OT  -evaluation for LTAC - reviewed with  case management  - swallow evaluation - thickened pureed diet at this time Maintain O2 sats 88% or higher  Follow cultures     Left shoulder pain  DG left shoulder today -analgesia - prn morphine  ID -continue IV abx as prescibed -follow up cultures  GI/Nutrition GI PROPHYLAXIS as indicated DIET-->TF's as tolerated Constipation protocol as indicated  ENDO - ICU hypoglycemic\Hyperglycemia protocol -check FSBS per protocol   ELECTROLYTES -follow labs as needed -replace as needed -pharmacy consultation   DVT/GI PRX ordered -SCDs  TRANSFUSIONS AS NEEDED MONITOR FSBS ASSESS the need for LABS as needed   Critical care provider statement:    Critical care time (minutes):  33   Critical care time was exclusive of:  Separately billable procedures and treating other patients   Critical care was necessary to treat or prevent imminent or life-threatening deterioration of the following conditions:  ARDS due to COVID19 pneumonia   Critical care was time spent personally by me on the following activities:  Development of treatment plan with patient or surrogate, discussions with consultants, evaluation of patient's response to treatment, examination of patient, obtaining history from patient or surrogate, ordering and performing treatments and interventions, ordering and review of laboratory studies and re-evaluation of patient's condition.  I assumed direction of critical care for this patient from another provider in my specialty: no    This document was prepared using Dragon voice recognition software and may include unintentional dictation errors.    Vida Rigger, M.D.  Division of Pulmonary & Critical Care Medicine  [place]

## 2020-05-28 DIAGNOSIS — G4733 Obstructive sleep apnea (adult) (pediatric): Secondary | ICD-10-CM | POA: Diagnosis not present

## 2020-05-28 DIAGNOSIS — U071 COVID-19: Secondary | ICD-10-CM | POA: Diagnosis not present

## 2020-05-28 DIAGNOSIS — J9601 Acute respiratory failure with hypoxia: Secondary | ICD-10-CM | POA: Diagnosis not present

## 2020-05-28 DIAGNOSIS — J1282 Pneumonia due to coronavirus disease 2019: Secondary | ICD-10-CM | POA: Diagnosis not present

## 2020-05-28 LAB — BASIC METABOLIC PANEL
Anion gap: 10 (ref 5–15)
BUN: 29 mg/dL — ABNORMAL HIGH (ref 6–20)
CO2: 32 mmol/L (ref 22–32)
Calcium: 8.9 mg/dL (ref 8.9–10.3)
Chloride: 101 mmol/L (ref 98–111)
Creatinine, Ser: 0.6 mg/dL — ABNORMAL LOW (ref 0.61–1.24)
GFR, Estimated: >60 mL/min (ref >60)
Glucose, Bld: 122 mg/dL — ABNORMAL HIGH (ref 70–99)
Potassium: 3.5 mmol/L (ref 3.5–5.1)
Sodium: 143 mmol/L (ref 135–145)

## 2020-05-28 LAB — GLUCOSE, CAPILLARY
Glucose-Capillary: 130 mg/dL — ABNORMAL HIGH (ref 70–99)
Glucose-Capillary: 126 mg/dL — ABNORMAL HIGH (ref 70–99)
Glucose-Capillary: 126 mg/dL — ABNORMAL HIGH (ref 70–99)
Glucose-Capillary: 128 mg/dL — ABNORMAL HIGH (ref 70–99)
Glucose-Capillary: 161 mg/dL — ABNORMAL HIGH (ref 70–99)
Glucose-Capillary: 122 mg/dL — ABNORMAL HIGH (ref 70–99)

## 2020-05-28 LAB — CBC WITH DIFFERENTIAL/PLATELET
Abs Immature Granulocytes: 0.09 10*3/uL — ABNORMAL HIGH (ref 0.00–0.07)
Basophils Absolute: 0 10*3/uL (ref 0.0–0.1)
Basophils Relative: 0 %
Eosinophils Absolute: 0.2 10*3/uL (ref 0.0–0.5)
Eosinophils Relative: 4 %
HCT: 35.5 % — ABNORMAL LOW (ref 39.0–52.0)
Hemoglobin: 10.9 g/dL — ABNORMAL LOW (ref 13.0–17.0)
Immature Granulocytes: 2 %
Lymphocytes Relative: 22 %
Lymphs Abs: 1.3 10*3/uL (ref 0.7–4.0)
MCH: 28.2 pg (ref 26.0–34.0)
MCHC: 30.7 g/dL (ref 30.0–36.0)
MCV: 91.7 fL (ref 80.0–100.0)
Monocytes Absolute: 0.4 10*3/uL (ref 0.1–1.0)
Monocytes Relative: 8 %
Neutro Abs: 3.7 10*3/uL (ref 1.7–7.7)
Neutrophils Relative %: 64 %
Platelets: 260 10*3/uL (ref 150–400)
RBC: 3.87 MIL/uL — ABNORMAL LOW (ref 4.22–5.81)
RDW: 19.5 % — ABNORMAL HIGH (ref 11.5–15.5)
WBC: 5.8 10*3/uL (ref 4.0–10.5)
nRBC: 0 % (ref 0.0–0.2)

## 2020-05-28 LAB — MAGNESIUM: Magnesium: 2.3 mg/dL (ref 1.7–2.4)

## 2020-05-28 LAB — PHOSPHORUS: Phosphorus: 4.9 mg/dL — ABNORMAL HIGH (ref 2.5–4.6)

## 2020-05-28 MED ORDER — CHLORHEXIDINE GLUCONATE CLOTH 2 % EX PADS
6.0000 | MEDICATED_PAD | Freq: Every day | CUTANEOUS | Status: DC
  Administered 2020-05-28 – 2020-06-08 (×11): 6 via TOPICAL

## 2020-05-28 MED ORDER — IBUPROFEN 400 MG PO TABS
600.0000 mg | ORAL_TABLET | Freq: Four times a day (QID) | ORAL | Status: DC | PRN
  Administered 2020-05-28: 600 mg via ORAL
  Filled 2020-05-28: qty 2

## 2020-05-28 NOTE — Progress Notes (Signed)
Nutrition Follow-up  DOCUMENTATION CODES:   Morbid obesity  INTERVENTION:   Nepro Shake po TID, each supplement provides 425 kcal and 19 grams protein  MVI daily   Vitamin C 500mg  po daily   NUTRITION DIAGNOSIS:   Inadequate oral intake related to inability to eat as evidenced by NPO status. - resolving   GOAL:   Patient will meet greater than or equal to 90% of their needs - progressing   MONITOR:   PO intake, Supplement acceptance, Labs, Weight trends, Skin, I & O's  ASSESSMENT:   34 year old male with PMHx of OSA admitted with COVID-19 PNA.   Pt seen by SLP and placed on a dysphagia 3/nectar thick diet. Pt documented to be eating anywhere from 25-100% of meals in hospital; pt is also drinking Nepro supplements. Recommend continue supplements and vitamins after discharge. Plan is for LTACH. Per chart, pt has remained fairly weight stable in hospital. No BM noted since 10/12; RN notified.    Labs reviewed: BUN 29(H), creat 0.60(L), P 4.9(H), Mg 2.3 wnl Hgb 10.9(L), Hct 35.5(L) cbgs- 130, 126, 126 x 24 hrs  Diet Order:   Diet Order            DIET DYS 3 Room service appropriate? Yes with Assist; Fluid consistency: Nectar Thick  Diet effective now                EDUCATION NEEDS:   No education needs have been identified at this time  Skin:  Skin Assessment:  (Stage II buttocks, MASD, PI toes)  Last BM:  10/12  Height:   Ht Readings from Last 1 Encounters:  05/12/20 5\' 10"  (1.778 m)   Weight:   Wt Readings from Last 1 Encounters:  05/28/20 (!) 167.4 kg   Ideal Body Weight:  75.5 kg  BMI:  Body mass index is 52.95 kg/m.  Estimated Nutritional Needs:   Kcal:  3200-3500kcal/day  Protein:  >160g/day  Fluid:  2.3-2.6L/day  MS, RD, LDN Please refer to [facility] for RD and/or RD on-call/weekend/after hours pager

## 2020-05-28 NOTE — Plan of Care (Signed)
Discussed earlier in front of patient plan of care for the evening, pain management and temperature control with some teach back displayed.  Talked to mother in the room importance of keeping the HEPA for negative pressure running in the room as machine capped and off.  Mother Flonnie Overman called and updated at [phone] about patients progress.

## 2020-05-28 NOTE — Progress Notes (Signed)
Physical Therapy Treatment Patient Details Name: Collin Brown MRN: [id number] DOB: 1986-05-05 Today's Date: 05/28/2020    History of Present Illness Collin Brown is a34yoM who comes to [facility] on 9/23 after progression of dyspnea. Pt recently (+)COVID19 along with other family. Pt required intubation on 9/25, self extubated on 10/10. At evaluation pt presenting on HHHF @55L /min, 90%FiO2.  Patient is progressing towards meeting functional goals. Co-treated with OT today in attempts to address mobility and safely progress activity given medical complexity. Patient required Max A +2 person for bed mobility with extra time for task initiation and cues for technique. Patient tolerated sitting upright for 3.5 minutes with Min A and occasional cues for midline balance and upright posture. Sp02 91% on 7 LHFNC and heart rate up to 123bpm with seated level activity. Patient recovers quickly with heart rate 109bpm and Sp02 95% once returned to bed with head of bed elevated. Recommend to continue PT to address functional limitations remaining to maximize independence in preparation for next level of care.   PT Comments      Follow Up Recommendations  CIR or LTAC depending oxygen requirements     Equipment Recommendations       Recommendations for Other Services       Precautions / Restrictions Precautions Precautions: Fall Restrictions Weight Bearing Restrictions: No    Mobility  Bed Mobility Overal bed mobility: Needs Assistance Bed Mobility: Supine to Sit;Sit to Supine     Supine to sit: Max assist;+2 for physical assistance Sit to supine: Max assist;+2 for physical assistance   General bed mobility comments: Verbal and tactile cues for sequencing and task initiation with delay. Assistance required for trunk and BLE support with head of bed elevated and intermittent use of bed rails   Transfers                 General transfer comment: transfers not assessed this session due  to poor sitting tolerance and fatigue with minimal activity   Ambulation/Gait                 Stairs             Wheelchair Mobility    Modified Rankin (Stroke Patients Only)       Balance Overall balance assessment: Needs assistance Sitting-balance support: Bilateral upper extremity supported (left foot supported) Sitting balance-Leahy Scale: Poor Sitting balance - Comments: patient needs at least minimal assistance to maintain sitting balance occasional posterior and lateral lean. faciliation and occasional use of bed rail to maintain midline sitting posture. cues to avoid neck flexion in sitting which is more pronounced with increased sitting time due to fatigue  Postural control: Posterior lean;Right lateral lean;Left lateral lean (can be corrected to midline for brief periods with cues )                                  Cognition Arousal/Alertness: Awake/alert Behavior During Therapy: WFL for tasks assessed/performed;Flat affect Overall Cognitive Status: Impaired/Different from baseline Area of Impairment: Following commands                       Following Commands: Follows one step commands with increased time              Exercises      General Comments        Pertinent Vitals/Pain Pain Assessment: 0-10 Pain Location: left shoulder  Pain Intervention(s): Monitored during session    Home Living                      Prior Function            PT Goals (current goals can now be found in the care plan section) Acute Rehab PT Goals Patient Stated Goal: regain strength and mobilty PT Goal Formulation: With patient Time For Goal Achievement: 06/18/20 Potential to Achieve Goals: Fair Progress towards PT goals: Progressing toward goals    Frequency    Min 2X/week      PT Plan Current plan remains appropriate    Co-evaluation PT/OT/SLP Co-Evaluation/Treatment: Yes Reason for Co-Treatment: To address  functional/ADL transfers PT goals addressed during session: Mobility/safety with mobility;Balance        AM-PAC PT "6 Clicks" Mobility   Outcome Measure  Help needed turning from your back to your side while in a flat bed without using bedrails?: Total Help needed moving from lying on your back to sitting on the side of a flat bed without using bedrails?: Total Help needed moving to and from a bed to a chair (including a wheelchair)?: Total Help needed standing up from a chair using your arms (e.g., wheelchair or bedside chair)?: Total Help needed to walk in hospital room?: Total Help needed climbing 3-5 steps with a railing? : Total 6 Click Score: 6    End of Session Equipment Utilized During Treatment: Oxygen (7 L HFNC) Activity Tolerance: Patient tolerated treatment well;No increased pain Patient left: in bed;with call bell/phone within reach (OT in room ) Nurse Communication: Mobility status (via white board updated ) PT Visit Diagnosis: Difficulty in walking, not elsewhere classified (R26.2);Other abnormalities of gait and mobility (R26.89);Muscle weakness (generalized) (M62.81)     Time: 8841-6606 PT Time Calculation (min) (ACUTE ONLY): 21 min  Charges:  $Therapeutic Activity: 8-22 mins                     Collin Brown, PT, MPT    [name] 05/28/2020, 12:58 PM

## 2020-05-28 NOTE — TOC Progression Note (Signed)
Transition of Care [facility]) - Progression Note    Patient Details  Name: Collin Brown MRN: [id number] Date of Birth: Nov 10, 1985  Transition [facility] CM/SW Contact  Maree Krabbe, LCSW Phone Number: 05/28/2020, 1:14 PM  Clinical Narrative:   Per Irving Burton at [facility] pt has to be on 65% or below 02 device before LTACH will take pt. At this time pt is on Bipap 100%. MD notified.         Expected Discharge Plan and Services                                                 Social Determinants of Health (SDOH) Interventions    Readmission Risk Interventions No flowsheet data found.

## 2020-05-28 NOTE — Progress Notes (Signed)
CH received word from ICU RN that pt. has requested this author to follow up w/him following his transfer from ICU to 2A; when [facility] found pt.'s rm. pt. lying in bed watching TV.  In extended visit, pt. shared his gratitude to God for his recovery from COVID and for being able to come off the ventilator.  This experience has given pt. a new sense of God's care fir him; pt. became tearful sharing that some severe childhood trauma had left him very angry with God, but that he looks back and can observe numerous instances when he 'should have died' and seems to have been spared; pt. seems to still be processing the significance of this observation, but is eager to go to rehab and regain his strength and independence.  Pt. 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  Per pt. request, CH prayed for pt.'s further recovery and rehab and for a sense of divine presence and love as pt. reflects on this near death experience with COVID.  Pt.'s mother arrived at end of visit; briefly expressed her gratitude to God for pt.'s recovery.  CH brought pt. a Bible per his request and left pt. and mother to visit.  CH remains available.

## 2020-05-28 NOTE — Progress Notes (Signed)
Occupational Therapy Treatment Patient Details Name: Collin Brown MRN: [id number] DOB: 1986/01/16 Today's Date: 05/28/2020    History of present illness Azavion Bouillon is a34yoM who comes to [facility] on 9/23 after progression of dyspnea. Pt recently (+)COVID19 along with other family. Pt required intubation on 9/25, self extubated on 10/10. At evaluation pt presenting on HHHF @55L /min, 90%FiO2.   OT comments  Pt seen today for OT/PT co-treatment session to address functional bed mobility in order to progress activity and in preparation for seated ADL tasks. Pt agreeable, alert, and able to speak much more clearly today. Pt endorses 3/10 L shoulder pain, with improvement noted with repositioning with pillows. Pt demonstrates mild improvements in sensation and strength to LUE as well as able to move fingers (2-). Pt required Max A +2 person for bed mobility with extra time for task initiation and cues for technique. Pt tolerated sitting upright for 3.5 minutes with Min A and occasional cues for midline balance and upright posture. Tendency for pt to revert to cervical flexion 2/2 fatigue. Sp02 91% on 7 LHFNC and heart rate up to 123bpm with seated level activity. Pt recovers quickly with heart rate 109bpm and Sp02 95% once returned to bed with head of bed elevated. Once repositioned with supports under BUE, pt set up with lunch tray. Supervision for safety to minimize risk of aspiration as well as set up assist for packets/containers, applying condiments to food. HOB 90degr, pillows positioned to support RUE to support improved tolerance for self feeding. Red foam built up handle applied to spoon with noted improvement in quality of movement to scoop and bring to mouth and pt endorses improvement; pt able to eat approx 25% of meal after set up requiring assist for another 25% 2/2 fatigue. Pt demonstrating significant progress this date in strength, activity tolerance, and cardiopulmonary status with exertion  during session. Pt continues to demonstrate need for high intensity skilled OT services at this time to maximize return to PLOF in order to return to work, which pt is eager to do. Recommendation updated to CIR given progress to date, now on 7L HFNC.    Follow Up Recommendations  CIR    Equipment Recommendations  3 in 1 bedside commode (bariatric BSC, reacher)    Recommendations for Other Services      Precautions / Restrictions Precautions Precautions: Fall Precaution Comments: significant deconditioning and weakness, struggling c limb control Restrictions Weight Bearing Restrictions: No       Mobility Bed Mobility Overal bed mobility: Needs Assistance Bed Mobility: Supine to Sit;Sit to Supine     Supine to sit: Max assist;+2 for physical assistance Sit to supine: Max assist;+2 for physical assistance   General bed mobility comments: Verbal and tactile cues for sequencing and task initiation with delay. Assistance required for trunk and BLE support with head of bed elevated and intermittent use of bed rails   Transfers                 General transfer comment: transfers not assessed this session due to poor sitting tolerance and fatigue with minimal activity     Balance Overall balance assessment: Needs assistance Sitting-balance support: Bilateral upper extremity supported (L foot supported) Sitting balance-Leahy Scale: Poor Sitting balance - Comments: patient needs at least minimal assistance to maintain sitting balance occasional posterior and lateral lean. faciliation and occasional use of bed rail to maintain midline sitting posture. cues to avoid neck flexion in sitting which is more pronounced with increased sitting  time due to fatigue  Postural control: Posterior lean;Right lateral lean;Left lateral lean (can be corrected to midline for brief periods with cues)                                 ADL either performed or assessed with clinical  judgement   ADL Overall ADL's : Needs assistance/impaired Eating/Feeding: Bed level;With adaptive utensils;Set up;Supervision/ safety Eating/Feeding Details (indicate cue type and reason): supervision for safety to minimize risk of aspiration; HOB 90degr, pillows positioned to support RUE to support improved tolerance for self feeding. Red foam built up handle applied to spoon with noted improvement in quality of movement to scoop and bring to mouth and pt endorses improvement; pt able to eat approx 25% of meal after set up requiring assist for another 25% 2/2 fatigue                                         Vision Patient Visual Report: No change from baseline     Perception     Praxis      Cognition Arousal/Alertness: Awake/alert Behavior During Therapy: WFL for tasks assessed/performed Overall Cognitive Status: Impaired/Different from baseline Area of Impairment: Following commands                       Following Commands: Follows one step commands with increased time     Problem Solving: Slow processing          Exercises     Shoulder Instructions       General Comments Pt on 7L O2 via HFNC, maintains O2 sats >91%, HR up to 123 with exertion, recovers to >94% SpO2 within a couple minutes of return to supine    Pertinent Vitals/ Pain       Pain Assessment: 0-10 Pain Score: 3  Pain Location: left shoulder  Pain Descriptors / Indicators: Aching Pain Intervention(s): Limited activity within patient's tolerance;Monitored during session;Repositioned  Home Living                                          Prior Functioning/Environment              Frequency  Min 3X/week        Progress Toward Goals  OT Goals(current goals can now be found in the care plan section)  Progress towards OT goals: Progressing toward goals  Acute Rehab OT Goals Patient Stated Goal: regain strength and mobilty and return to  work/independence OT Goal Formulation: With patient Time For Goal Achievement: 06/04/20 Potential to Achieve Goals: Good  Plan Discharge plan needs to be updated;Frequency needs to be updated    Co-evaluation    PT/OT/SLP Co-Evaluation/Treatment: Yes Reason for Co-Treatment: For patient/therapist safety;To address functional/ADL transfers PT goals addressed during session: Mobility/safety with mobility;Balance OT goals addressed during session: ADL's and self-care      AM-PAC OT "6 Clicks" Daily Activity     Outcome Measure   Help from another person eating meals?: A Little Help from another person taking care of personal grooming?: A Lot Help from another person toileting, which includes using toliet, bedpan, or urinal?: Total Help from another person bathing (including washing, rinsing, drying)?: Total Help from  another person to put on and taking off regular upper body clothing?: A Lot Help from another person to put on and taking off regular lower body clothing?: Total 6 Click Score: 10    End of Session        Activity Tolerance Patient tolerated treatment well   Patient Left in bed;with call bell/phone within reach;with bed alarm set   Nurse Communication Mobility status;Other (comment) (vitals during exertion, ate ~50% of meal with assist)        Time: 1607-3710 OT Time Calculation (min): 49 min  Charges: OT General Charges $OT Visit: 1 Visit OT Treatments $Self Care/Home Management : 8-22 mins $Therapeutic Activity: 8-22 mins  Richrd Prime, MPH, MS, OTR/L ascom [phone] 05/28/20, 1:57 PM

## 2020-05-28 NOTE — Plan of Care (Signed)
Discussed with patient in front of mother plan of care for the evening, pain management and sleep throughout the night with some teach back displayed.

## 2020-05-28 NOTE — Progress Notes (Signed)
Inpatient Rehab Admissions Coordinator Note:   Per updated therapy recommendations, pt was screened for CIR candidacy by Estill Dooms, PT, DPT.  At this time pt does not appear to be able to tolerate CIR level therapy (+3 for pulling up in bed, and A/AROM only).  Will not request an order at this time; however I will follow from a distance, and if pt continues to progress we can re-evaluate.  Please contact me with questions.   Estill Dooms, PT, DPT [phone] 05/28/20 11:07 AM

## 2020-05-28 NOTE — Progress Notes (Signed)
Progress Note    Collin HuaMatthew M Marzec  OZH:[id number] DOB: Dec 16, 1985  DOA: 05/03/2020 PCP: Patient, No Pcp Per      Brief Narrative:    Medical records reviewed and are as summarized below:  Collin Brown is a 34 y.o. male with medical history significant for obstructive sleep apnea, morbid obesity, presented to the hospital on May 03, 2020 with cough, shortness of breath, generalized weakness, fever and a positive Covid 19 test. He was admitted to the medical floor for acute hypoxemic respiratory failure secondary to COVID-19 pneumonia. He was treated with IV remdesivir and IV steroids. Baricitinib was not given because of leukopenia.  His condition deteriorated and hypoxemia worsened. He was transferred to the ICU on May 05, 2020 where he was initially treated with BiPAP. However, he failed BiPAP therapy and so he was eventually intubated and placed on mechanical ventilation on May 05, 2020. He self extubated on May 20, 2020 was transitioned to BiPAP. Hospital course was complicated by acute metabolic encephalopathy and ARDS. His condition slowly improved and was transitioned to oxygenation via high flow nasal cannula. His level of care was downgraded from ICU status to stepdown status and the hospitalist team was consulted to continue with medical management.     Assessment/Plan:   Principal Problem:   Acute hypoxemic respiratory failure due to COVID-19 [facility](HCC) Active Problems:   Sleep apnea   Pneumonia due to COVID-19 virus   Pressure injury of skin   Nutrition Problem: Inadequate oral intake Etiology: inability to eat  Signs/Symptoms: NPO status   Body mass index is 52.95 kg/m. (Morbid obesity)   COVID-19 pneumonia: Completed treatment with IV steroids and IV remdesivir.  Acute hypoxemic respiratory failure with ARDS: Self extubated on 05/20/2020. He requires BiPAP at night.  Continue 7 L/min oxygen via HFNC during the day.  Taper off oxygen as able.   Type II DM:  Hemoglobin A1c was 7.2.  Continue Levemir and Humalog as needed for hyperglycemia.  Obstructive sleep apnea/chronic hypercapnic respiratory failure/probable OHS: Continue BiPAP at night   continue Pepcid for GI prophylaxis  Continue Lovenox for DVT prophylaxis  Continue PT and OT  Awaiting transfer to LTAC.   Pressure Injury 05/12/20 Toe (Comment  which one) Anterior;Right Stage 2 -  Partial thickness loss of dermis presenting as a shallow open injury with a red, pink wound bed without slough. (Active)  05/12/20 1930  Location: Toe (Comment  which one)  Location Orientation: Anterior;Right  Staging: Stage 2 -  Partial thickness loss of dermis presenting as a shallow open injury with a red, pink wound bed without slough.  Wound Description (Comments):   Present on Admission: No     Pressure Injury 05/12/20 Toe (Comment  which one) Anterior;Left Stage 2 -  Partial thickness loss of dermis presenting as a shallow open injury with a red, pink wound bed without slough. (Active)  05/12/20 1930  Location: Toe (Comment  which one)  Location Orientation: Anterior;Left  Staging: Stage 2 -  Partial thickness loss of dermis presenting as a shallow open injury with a red, pink wound bed without slough.  Wound Description (Comments):   Present on Admission: No     Pressure Injury 05/21/20 Buttocks Right;Left purple/white (Active)  05/21/20 2123  Location: Buttocks  Location Orientation: Right;Left  Staging:   Wound Description (Comments): purple/white  Present on Admission:           Diet Order  DIET DYS 3 Room service appropriate? Yes with Assist; Fluid consistency: Nectar Thick  Diet effective now                    Consultants:  Intensivist  Procedures:  Intubation mechanical ventilation on May 05, 2020  Self extubated on May 20, 2020    Medications:   . vitamin C  500 mg Oral Daily  . aspirin  81 mg Oral Daily  . chlorhexidine  15 mL Mouth Rinse  BID  . enoxaparin (LOVENOX) injection  0.5 mg/kg Subcutaneous Q24H  . famotidine  20 mg Oral Daily  . feeding supplement (NEPRO CARB STEADY)  237 mL Oral TID BM  . furosemide  40 mg Intravenous Daily  . insulin aspart  0-20 Units Subcutaneous Q4H  . insulin detemir  20 Units Subcutaneous BID  . mouth rinse  15 mL Mouth Rinse q12n4p  . multivitamin with minerals  1 tablet Oral Daily  . polyethylene glycol  17 g Oral Daily   Continuous Infusions: . sodium chloride Stopped (05/22/20 0823)     Anti-infectives (From admission, onward)   Start     Dose/Rate Route Frequency Ordered Stop   05/16/20 1800  Ampicillin-Sulbactam (UNASYN) 3 g in sodium chloride 0.9 % 100 mL IVPB  Status:  Discontinued        3 g 200 mL/hr over 30 Minutes Intravenous Every 6 hours 05/16/20 1458 05/22/20 1008   05/14/20 0600  ceFEPIme (MAXIPIME) 2 g in sodium chloride 0.9 % 100 mL IVPB  Status:  Discontinued        2 g 200 mL/hr over 30 Minutes Intravenous Every 8 hours 05/14/20 0533 05/16/20 1458   05/07/20 1200  meropenem (MERREM) 1 g in sodium chloride 0.9 % 100 mL IVPB  Status:  Discontinued        1 g 200 mL/hr over 30 Minutes Intravenous Every 8 hours 05/07/20 1020 05/09/20 1019   05/06/20 1200  vancomycin (VANCOCIN) IVPB 1000 mg/200 mL premix  Status:  Discontinued        1,000 mg 200 mL/hr over 60 Minutes Intravenous Every 8 hours 05/05/20 2330 05/07/20 1020   05/06/20 0200  vancomycin (VANCOREADY) IVPB 500 mg/100 mL       "Followed by" Linked Group Details   500 mg 100 mL/hr over 60 Minutes Intravenous  Once 05/05/20 2330 05/06/20 0454   05/06/20 0000  vancomycin (VANCOREADY) IVPB 2000 mg/400 mL       "Followed by" Linked Group Details   2,000 mg 200 mL/hr over 120 Minutes Intravenous  Once 05/05/20 2330 05/06/20 0304   05/05/20 2315  cefTRIAXone (ROCEPHIN) 2 g in sodium chloride 0.9 % 100 mL IVPB  Status:  Discontinued        2 g 200 mL/hr over 30 Minutes Intravenous Daily at 10 pm 05/05/20 2307  05/07/20 1020   05/04/20 1000  remdesivir 100 mg in sodium chloride 0.9 % 100 mL IVPB       "Followed by" Linked Group Details   100 mg 200 mL/hr over 30 Minutes Intravenous Daily 05/03/20 1718 05/07/20 0941   05/03/20 1800  remdesivir 200 mg in sodium chloride 0.9% 250 mL IVPB       "Followed by" Linked Group Details   200 mg 580 mL/hr over 30 Minutes Intravenous Once 05/03/20 1718 05/03/20 1941             Family Communication/Anticipated D/C date and plan/Code Status   DVT prophylaxis:  Code Status: Full Code  Family Communication: None Disposition Plan:    Status is: Inpatient  Remains inpatient appropriate because:Inpatient level of care appropriate due to severity of illness   Dispo: The patient is from: Home              Anticipated d/c is to: LTAC              Anticipated d/c date is: 3 days              Patient currently is not medically stable to d/c.           Subjective:   He complains of generalized weakness.  No shortness of breath or chest pain.  Objective:    Vitals:   05/28/20 0339 05/28/20 0559 05/28/20 0601 05/28/20 0747  BP:    (!) 153/85  Pulse:    (!) 110  Resp:  14 19 17   Temp:    98 F (36.7 C)  TempSrc:      SpO2:    93%  Weight: (!) 167.4 kg     Height:       No data found.   Intake/Output Summary (Last 24 hours) at 05/28/2020 1118 Last data filed at 05/28/2020 0400 Gross per 24 hour  Intake 2540 ml  Output 2200 ml  Net 340 ml   Filed Weights   05/25/20 0500 05/27/20 0500 05/28/20 0339  Weight: (!) 171.2 kg (!) 171.2 kg (!) 167.4 kg    Exam:   GEN: NAD SKIN: No rash EYES: EOMI ENT: MMM CV: RRR PULM: CTA B ABD: soft, obese, NT, +BS CNS: AAO x 3, non focal EXT: No edema or tenderness      Data Reviewed:   I have personally reviewed following labs and imaging studies:  Labs: Labs show the following:   Basic Metabolic Panel: Recent Labs  Lab 05/24/20 0535 05/24/20 0535 05/25/20  0345 05/25/20 0345 05/26/20 0543 05/26/20 0543 05/27/20 0452 05/28/20 0405  NA 143  --  144  --  141  --  141 143  K 3.5   < > 3.6   < > 4.0   < > 3.6 3.5  CL 102  --  103  --  103  --  101 101  CO2 33*  --  28  --  29  --  31 32  GLUCOSE 128*  --  130*  --  154*  --  131* 122*  BUN 30*  --  26*  --  25*  --  28* 29*  CREATININE 0.65  --  0.54*  --  0.47*  --  0.60* 0.60*  CALCIUM 8.7*  --  8.6*  --  9.0  --  9.0 8.9  MG 2.2  --  2.1  --  2.2  --  2.2 2.3  PHOS 4.0  --  3.8  --  4.6  --  4.8* 4.9*   < > = values in this interval not displayed.   GFR Estimated Creatinine Clearance: 205.8 mL/min (A) (by C-G formula based on SCr of 0.6 mg/dL (L)). Liver Function Tests: No results for input(s): AST, ALT, ALKPHOS, BILITOT, PROT, ALBUMIN in the last 168 hours. No results for input(s): LIPASE, AMYLASE in the last 168 hours. No results for input(s): AMMONIA in the last 168 hours. Coagulation profile No results for input(s): INR, PROTIME in the last 168 hours.  CBC: Recent Labs  Lab 05/24/20 0535 05/25/20 0345 05/26/20 0543 05/27/20 0452 05/28/20 0405  WBC 8.1 8.0 7.6 6.4 5.8  NEUTROABS 6.0 5.7 5.9 4.4 3.7  HGB 9.7* 9.7* 10.8* 10.5* 10.9*  HCT 31.5* 32.1* 34.7* 33.8* 35.5*  MCV 91.8 92.0 91.3 91.8 91.7  PLT 241 248 253 263 260   Cardiac Enzymes: No results for input(s): CKTOTAL, CKMB, CKMBINDEX, TROPONINI in the last 168 hours. BNP (last 3 results) No results for input(s): PROBNP in the last 8760 hours. CBG: Recent Labs  Lab 05/27/20 1635 05/27/20 2005 05/27/20 2328 05/28/20 0336 05/28/20 0744  GLUCAP 150* 158* 130* 130* 126*   D-Dimer: No results for input(s): DDIMER in the last 72 hours. Hgb A1c: No results for input(s): HGBA1C in the last 72 hours. Lipid Profile: No results for input(s): CHOL, HDL, LDLCALC, TRIG, CHOLHDL, LDLDIRECT in the last 72 hours. Thyroid function studies: No results for input(s): TSH, T4TOTAL, T3FREE, THYROIDAB in the last 72 hours.   Invalid input(s): FREET3 Anemia work up: No results for input(s): VITAMINB12, FOLATE, FERRITIN, TIBC, IRON, RETICCTPCT in the last 72 hours. Sepsis Labs: Recent Labs  Lab 05/25/20 0345 05/26/20 0543 05/27/20 0452 05/28/20 0405  WBC 8.0 7.6 6.4 5.8    Microbiology No results found for this or any previous visit (from the past 240 hour(s)).  Procedures and diagnostic studies:  DG Shoulder Left  Result Date: 05/26/2020 CLINICAL DATA:  34 year old male with left shoulder pain EXAM: LEFT SHOULDER - 2+ VIEW COMPARISON:  Chest radiograph from 05/18/2020 FINDINGS: There is no evidence of fracture or dislocation. There is no evidence of arthropathy or other focal bone abnormality. Soft tissues are unremarkable. IMPRESSION: No acute fracture or malalignment. Electronically Signed   By: Marliss Coots MD   On: 05/26/2020 15:57               LOS: 25 days   Shaquandra Galano  [facility]   Pager on [email or web address]. If 7PM-7AM, please contact night-coverage at [email or web address]     05/28/2020, 11:18 AM

## 2020-05-29 DIAGNOSIS — G4733 Obstructive sleep apnea (adult) (pediatric): Secondary | ICD-10-CM | POA: Diagnosis not present

## 2020-05-29 DIAGNOSIS — J1282 Pneumonia due to coronavirus disease 2019: Secondary | ICD-10-CM | POA: Diagnosis not present

## 2020-05-29 DIAGNOSIS — J9601 Acute respiratory failure with hypoxia: Secondary | ICD-10-CM | POA: Diagnosis not present

## 2020-05-29 DIAGNOSIS — U071 COVID-19: Secondary | ICD-10-CM | POA: Diagnosis not present

## 2020-05-29 LAB — CBC WITH DIFFERENTIAL/PLATELET
Abs Immature Granulocytes: 0.05 10*3/uL (ref 0.00–0.07)
Basophils Absolute: 0 10*3/uL (ref 0.0–0.1)
Basophils Relative: 0 %
Eosinophils Absolute: 0.2 10*3/uL (ref 0.0–0.5)
Eosinophils Relative: 5 %
HCT: 34.7 % — ABNORMAL LOW (ref 39.0–52.0)
Hemoglobin: 10.7 g/dL — ABNORMAL LOW (ref 13.0–17.0)
Immature Granulocytes: 1 %
Lymphocytes Relative: 20 %
Lymphs Abs: 1.1 10*3/uL (ref 0.7–4.0)
MCH: 28.2 pg (ref 26.0–34.0)
MCHC: 30.8 g/dL (ref 30.0–36.0)
MCV: 91.3 fL (ref 80.0–100.0)
Monocytes Absolute: 0.4 10*3/uL (ref 0.1–1.0)
Monocytes Relative: 8 %
Neutro Abs: 3.5 10*3/uL (ref 1.7–7.7)
Neutrophils Relative %: 66 %
Platelets: 232 10*3/uL (ref 150–400)
RBC: 3.8 MIL/uL — ABNORMAL LOW (ref 4.22–5.81)
RDW: 18.9 % — ABNORMAL HIGH (ref 11.5–15.5)
WBC: 5.3 10*3/uL (ref 4.0–10.5)
nRBC: 0 % (ref 0.0–0.2)

## 2020-05-29 LAB — LIPID PANEL
Cholesterol: 216 mg/dL — ABNORMAL HIGH (ref 0–200)
HDL: 24 mg/dL — ABNORMAL LOW (ref >40)
LDL Cholesterol: 153 mg/dL — ABNORMAL HIGH (ref 0–99)
Total CHOL/HDL Ratio: 9 RATIO
Triglycerides: 194 mg/dL — ABNORMAL HIGH (ref <150)
VLDL: 39 mg/dL (ref 0–40)

## 2020-05-29 LAB — GLUCOSE, CAPILLARY
Glucose-Capillary: 111 mg/dL — ABNORMAL HIGH (ref 70–99)
Glucose-Capillary: 135 mg/dL — ABNORMAL HIGH (ref 70–99)
Glucose-Capillary: 192 mg/dL — ABNORMAL HIGH (ref 70–99)
Glucose-Capillary: 145 mg/dL — ABNORMAL HIGH (ref 70–99)
Glucose-Capillary: 158 mg/dL — ABNORMAL HIGH (ref 70–99)
Glucose-Capillary: 110 mg/dL — ABNORMAL HIGH (ref 70–99)

## 2020-05-29 LAB — PHOSPHORUS: Phosphorus: 4.6 mg/dL (ref 2.5–4.6)

## 2020-05-29 LAB — MAGNESIUM: Magnesium: 2.3 mg/dL (ref 1.7–2.4)

## 2020-05-29 NOTE — Consult Note (Signed)
Physical Medicine and Rehabilitation Consult Reason for Consult: Assess for CIR Referring Physician: Lurene Shadow, MD   HPI: Collin Brown is a 34 y.o. male who was admitted to [facility] on 9/23 with progression of dyspnea. He and his family members were recently COVID-19 positive. He required intubation on 9/25 and self-extubated on 10/10. Physical Medicine & Rehabilitation was consulted to assess candidacy for CIR. He is currently not able to tolerate transfers or ambulation and plan is for SNF at [facility].   Review of Systems  Constitutional: Negative.   HENT: Negative.   Eyes: Negative.   Respiratory: Negative.   Cardiovascular: Negative.   Gastrointestinal: Negative.   Genitourinary: Negative.   Musculoskeletal: Negative.   Skin: Negative.   Neurological: Negative.   Endo/Heme/Allergies: Negative.   Psychiatric/Behavioral: Negative.    Past Medical History:  Diagnosis Date  . Hearing loss in right ear   . Sleep apnea    Past Surgical History:  Procedure Laterality Date  . TONSILLECTOMY     Family History  Problem Relation Age of Onset  . Diabetes Mellitus II Mother   . Hypertension Mother   . Diabetes Mellitus II Father   . High blood pressure Father    Social History:  reports that he has never smoked. He has never used smokeless tobacco. He reports that he does not drink alcohol and does not use drugs. Allergies: No Known Allergies Medications Prior to Admission  Medication Sig Dispense Refill  . amoxicillin-clavulanate (AUGMENTIN) 875-125 MG tablet Take 1 tablet by mouth 2 (two) times daily. (Patient not taking: Reported on 05/03/2020) 20 tablet 0    Home: Home Living Family/patient expects to be discharged to:: Private residence Living Arrangements: Parent, Other (Comment) (Mother, and 19yo Nephew) Available Help at Discharge: Family Type of Home: House Home Layout: One level Home Equipment: None Additional Comments: Pt unable to verbalize, therapist  unable to read lips, unable to hold pen to write; mom's phone goes straight to voicemail; will verify as able  Functional History: Prior Function Level of Independence: Independent Comments: Pt was working as a Production designer, theatre/television/film at [employer]; traveled to to [place] to visit his brother over the Summer. Functional Status:  Mobility: Bed Mobility Overal bed mobility: Needs Assistance Bed Mobility: Supine to Sit, Sit to Supine Rolling: +2 for safety/equipment, Total assist Supine to sit: Max assist, +2 for physical assistance Sit to supine: Max assist, +2 for physical assistance General bed mobility comments: Verbal and tactile cues for sequencing and task initiation with delay. Assistance required for trunk and BLE support with head of bed elevated and intermittent use of bed rails  Transfers General transfer comment: transfers not assessed this session due to poor sitting tolerance and fatigue with minimal activity       ADL: ADL Overall ADL's : Needs assistance/impaired Eating/Feeding: Bed level, With adaptive utensils, Set up, Supervision/ safety Eating/Feeding Details (indicate cue type and reason): supervision for safety to minimize risk of aspiration; HOB 90degr, pillows positioned to support RUE to support improved tolerance for self feeding. Red foam built up handle applied to spoon with noted improvement in quality of movement to scoop and bring to mouth and pt endorses improvement; pt able to eat approx 25% of meal after set up requiring assist for another 25% 2/2 fatigue Grooming: Wash/dry face, Bed level, Maximal assistance Grooming Details (indicate cue type and reason): max A to perform requiring rest breaks 2/2 significant fatigue General ADL Comments: Max A for bed level grooming;  anticpate at least Max +2 for all bed level bathing, dressing, and toileting 2/2 current impairments.  Cognition: Cognition Overall Cognitive Status: Impaired/Different from baseline Orientation Level: Oriented  to person, Oriented to place, Oriented to time, Disoriented to situation Cognition Arousal/Alertness: Awake/alert Behavior During Therapy: WFL for tasks assessed/performed Overall Cognitive Status: Impaired/Different from baseline Area of Impairment: Following commands Current Attention Level: Alternating, Divided Following Commands: Follows one step commands with increased time Problem Solving: Slow processing General Comments: alert and oriented, correctly responses to yes/no questions by nodding/shaking head, still unable to vocalize coherently Difficult to assess due to: Impaired communication  Blood pressure (!) 143/88, pulse (!) 110, temperature 98.3 F (36.8 C), temperature source Oral, resp. rate 18, height 5\' 10"  (1.778 m), weight (!) 162.7 kg, SpO2 94 %. Physical Exam   General: Alert, No apparent distress HEENT: Head is normocephalic, atraumatic, PERRLA, EOMI, sclera anicteric, oral mucosa pink and moist, dentition intact, ext ear canals clear,  Neck: Supple without JVD or lymphadenopathy Heart: Tachycardic. No murmurs rubs or gallops Chest: CTA bilaterally without wheezes, rales, or rhonchi; no distress Abdomen: Soft, non-tender, non-distended, bowel sounds positive. Extremities: No clubbing, cyanosis, or edema. Pulses are 2+ Skin: Stage 2 pressure injury to multiple toes, purple/white wound on bilateral buttocks Neuro: Alert and oriented x3. MMT significantly limited by weakness.  Psych: Pt's affect is appropriate. Pt is cooperative   Results for orders placed or performed during the hospital encounter of 05/03/20 (from the past 24 hour(s))  Glucose, capillary     Status: Abnormal   Collection Time: 05/28/20  3:43 PM  Result Value Ref Range   Glucose-Capillary 128 (H) 70 - 99 mg/dL  Glucose, capillary     Status: Abnormal   Collection Time: 05/28/20  7:19 PM  Result Value Ref Range   Glucose-Capillary 161 (H) 70 - 99 mg/dL   Comment 1 Notify RN    Comment 2 Document  in Chart   Glucose, capillary     Status: Abnormal   Collection Time: 05/28/20 11:08 PM  Result Value Ref Range   Glucose-Capillary 122 (H) 70 - 99 mg/dL  Glucose, capillary     Status: Abnormal   Collection Time: 05/29/20  4:02 AM  Result Value Ref Range   Glucose-Capillary 111 (H) 70 - 99 mg/dL  Phosphorus     Status: None   Collection Time: 05/29/20  5:07 AM  Result Value Ref Range   Phosphorus 4.6 2.5 - 4.6 mg/dL  CBC with Differential/Platelet     Status: Abnormal   Collection Time: 05/29/20  5:07 AM  Result Value Ref Range   WBC 5.3 4.0 - 10.5 K/uL   RBC 3.80 (L) 4.22 - 5.81 MIL/uL   Hemoglobin 10.7 (L) 13.0 - 17.0 g/dL   HCT 05/31/20 (L) 39 - 52 %   MCV 91.3 80.0 - 100.0 fL   MCH 28.2 26.0 - 34.0 pg   MCHC 30.8 30.0 - 36.0 g/dL   RDW 46.5 (H) 03.5 - 46.5 %   Platelets 232 150 - 400 K/uL   nRBC 0.0 0.0 - 0.2 %   Neutrophils Relative % 66 %   Neutro Abs 3.5 1.7 - 7.7 K/uL   Lymphocytes Relative 20 %   Lymphs Abs 1.1 0.7 - 4.0 K/uL   Monocytes Relative 8 %   Monocytes Absolute 0.4 0.1 - 1.0 K/uL   Eosinophils Relative 5 %   Eosinophils Absolute 0.2 0.0 - 0.5 K/uL   Basophils Relative 0 %   Basophils  Absolute 0.0 0.0 - 0.1 K/uL   Immature Granulocytes 1 %   Abs Immature Granulocytes 0.05 0.00 - 0.07 K/uL  Magnesium     Status: None   Collection Time: 05/29/20  5:07 AM  Result Value Ref Range   Magnesium 2.3 1.7 - 2.4 mg/dL  Glucose, capillary     Status: Abnormal   Collection Time: 05/29/20  7:40 AM  Result Value Ref Range   Glucose-Capillary 135 (H) 70 - 99 mg/dL  Glucose, capillary     Status: Abnormal   Collection Time: 05/29/20 11:29 AM  Result Value Ref Range   Glucose-Capillary 192 (H) 70 - 99 mg/dL   No results found.  Assessment/Plan: Patient is currently unable to tolerate 3 hours per day of intensive therapies but we will continue to monitor and will consider for inpatient rehab if tolerance improves. Therapy notes reviewed and currently performing bed  mobility- unable to transfer or ambulate during 10/18 sessions. Current plan appears to be [facility] as per SW note. Thank you for this consult. I will scheduled outpatient physiatry follow-up for rehabilitation management while patient is at SNF and afterward.   Horton Chin, MD 05/29/2020

## 2020-05-29 NOTE — TOC Progression Note (Signed)
Transition of Care [facility]) - Progression Note    Patient Details  Name: Collin Brown MRN: [id number] Date of Birth: May 09, 1986  [facility]) CM/SW Contact  Maree Krabbe, LCSW Phone Number: 05/29/2020, 1:07 PM  Clinical Narrative:   CSW spoke with pt's mom via telephone. Irving Burton at [facility] states pt would not qualify for [facility] given his lessoning 02 requirement. CSW explained to pt's mother pt no longer would qualify or LTACH and states pt would be more appropriate for SNF--pt's mom understanding. Pt's mom states [facility] would be the most appropriate given location. Referral sent.   Expected Discharge Plan: Skilled Nursing Facility Barriers to Discharge: Continued Medical Work up  Expected Discharge Plan and Services Expected Discharge Plan: Skilled Nursing Facility In-house Referral: Clinical Social Work   Post Acute Care Choice: Skilled Nursing Facility Living arrangements for the past 2 months: Single Family Home                                       Social Determinants of Health (SDOH) Interventions    Readmission Risk Interventions No flowsheet data found.

## 2020-05-29 NOTE — Progress Notes (Addendum)
Progress Note    Collin BARNHARD  Brown:[id number] DOB: 18-Dec-1985  DOA: 05/03/2020 PCP: Patient, No Pcp Per      Brief Narrative:    Medical records reviewed and are as summarized below:  Collin Brown is a 34 y.o. male with medical history significant for obstructive sleep apnea, morbid obesity, presented to the [facility] on May 03, 2020 with cough, shortness of breath, generalized weakness, fever and a positive Covid 19 test. He was admitted to the medical floor for acute hypoxemic respiratory failure secondary to COVID-19 pneumonia. He was treated with IV remdesivir and IV steroids. Baricitinib was not given because of leukopenia.  His condition deteriorated and hypoxemia worsened. He was transferred to the ICU on May 05, 2020 where he was initially treated with BiPAP. However, he failed BiPAP therapy and so he was eventually intubated and placed on mechanical ventilation on May 05, 2020. He self extubated on May 20, 2020 was transitioned to BiPAP. [facility] course was complicated by acute metabolic encephalopathy and ARDS. His condition slowly improved and was transitioned to oxygenation via high flow nasal cannula. His level of care was downgraded from ICU status to stepdown status and the hospitalist team was consulted to continue with medical management.     Assessment/Plan:   Principal Problem:   Acute hypoxemic respiratory failure due to COVID-19 Collin Brown) Active Problems:   Sleep apnea   Pneumonia due to COVID-19 virus   Pressure injury of skin   Nutrition Problem: Inadequate oral intake Etiology: inability to eat  Signs/Symptoms: NPO status   Body mass index is 51.47 kg/m. (Morbid obesity)   COVID-19 pneumonia: Completed treatment with IV steroids  Acute hypoxemic respiratory failure with ARDS: Self extubated on 05/20/2020. He requires BiPAP at night.  Still on 7L/ min oxygen via HFNC during the day.  Taper down oxygen as able.  Discontinue IV Lasix.  Type II  DM: Hemoglobin A1c was 7.2.  Continue Levemir and Humalog as needed for hyperglycemia.  Check lipid panel  Obstructive sleep apnea/chronic hypercapnic respiratory failure/probable OHS: Continue BiPAP at night   Discontinue Pepcid for GI prophylaxis  Continue Lovenox for DVT prophylaxis  Continue PT and OT  Awaiting transfer to LTAC versus SNF versus inpatient rehab  Pressure Injury 05/12/20 Toe (Comment  which one) Anterior;Right Stage 2 -  Partial thickness loss of dermis presenting as a shallow open injury with a red, pink wound bed without slough. (Active)  05/12/20 1930  Location: Toe (Comment  which one)  Location Orientation: Anterior;Right  Staging: Stage 2 -  Partial thickness loss of dermis presenting as a shallow open injury with a red, pink wound bed without slough.  Wound Description (Comments):   Present on Admission: No     Pressure Injury 05/12/20 Toe (Comment  which one) Anterior;Left Stage 2 -  Partial thickness loss of dermis presenting as a shallow open injury with a red, pink wound bed without slough. (Active)  05/12/20 1930  Location: Toe (Comment  which one)  Location Orientation: Anterior;Left  Staging: Stage 2 -  Partial thickness loss of dermis presenting as a shallow open injury with a red, pink wound bed without slough.  Wound Description (Comments):   Present on Admission: No     Pressure Injury 05/21/20 Buttocks Right;Left purple/white (Active)  05/21/20 2123  Location: Buttocks  Location Orientation: Right;Left  Staging:   Wound Description (Comments): purple/white  Present on Admission:           Diet  Order            DIET DYS 3 Room service appropriate? Yes with Assist; Fluid consistency: Nectar Thick  Diet effective now                    Consultants:  Intensivist  Procedures:  Intubation mechanical ventilation on May 05, 2020  Self extubated on May 20, 2020    Medications:   . vitamin C  500 mg Oral Daily  .  aspirin  81 mg Oral Daily  . chlorhexidine  15 mL Mouth Rinse BID  . Chlorhexidine Gluconate Cloth  6 each Topical Q0600  . enoxaparin (LOVENOX) injection  0.5 mg/kg Subcutaneous Q24H  . feeding supplement (NEPRO CARB STEADY)  237 mL Oral TID BM  . insulin aspart  0-20 Units Subcutaneous Q4H  . insulin detemir  20 Units Subcutaneous BID  . mouth rinse  15 mL Mouth Rinse q12n4p  . multivitamin with minerals  1 tablet Oral Daily  . polyethylene glycol  17 g Oral Daily   Continuous Infusions: . sodium chloride Stopped (05/22/20 0823)     Anti-infectives (From admission, onward)   Start     Dose/Rate Route Frequency Ordered Stop   05/16/20 1800  Ampicillin-Sulbactam (UNASYN) 3 g in sodium chloride 0.9 % 100 mL IVPB  Status:  Discontinued        3 g 200 mL/hr over 30 Minutes Intravenous Every 6 hours 05/16/20 1458 05/22/20 1008   05/14/20 0600  ceFEPIme (MAXIPIME) 2 g in sodium chloride 0.9 % 100 mL IVPB  Status:  Discontinued        2 g 200 mL/hr over 30 Minutes Intravenous Every 8 hours 05/14/20 0533 05/16/20 1458   05/07/20 1200  meropenem (MERREM) 1 g in sodium chloride 0.9 % 100 mL IVPB  Status:  Discontinued        1 g 200 mL/hr over 30 Minutes Intravenous Every 8 hours 05/07/20 1020 05/09/20 1019   05/06/20 1200  vancomycin (VANCOCIN) IVPB 1000 mg/200 mL premix  Status:  Discontinued        1,000 mg 200 mL/hr over 60 Minutes Intravenous Every 8 hours 05/05/20 2330 05/07/20 1020   05/06/20 0200  vancomycin (VANCOREADY) IVPB 500 mg/100 mL       "Followed by" Linked Group Details   500 mg 100 mL/hr over 60 Minutes Intravenous  Once 05/05/20 2330 05/06/20 0454   05/06/20 0000  vancomycin (VANCOREADY) IVPB 2000 mg/400 mL       "Followed by" Linked Group Details   2,000 mg 200 mL/hr over 120 Minutes Intravenous  Once 05/05/20 2330 05/06/20 0304   05/05/20 2315  cefTRIAXone (ROCEPHIN) 2 g in sodium chloride 0.9 % 100 mL IVPB  Status:  Discontinued        2 g 200 mL/hr over 30  Minutes Intravenous Daily at 10 pm 05/05/20 2307 05/07/20 1020   05/04/20 1000  remdesivir 100 mg in sodium chloride 0.9 % 100 mL IVPB       "Followed by" Linked Group Details   100 mg 200 mL/hr over 30 Minutes Intravenous Daily 05/03/20 1718 05/07/20 0941   05/03/20 1800  remdesivir 200 mg in sodium chloride 0.9% 250 mL IVPB       "Followed by" Linked Group Details   200 mg 580 mL/hr over 30 Minutes Intravenous Once 05/03/20 1718 05/03/20 1941             Family Communication/Anticipated D/C date and plan/Code Status  DVT prophylaxis:      Code Status: Full Code  Family Communication: None Disposition Plan:    Status is: Inpatient  Remains inpatient appropriate because:Inpatient level of care appropriate due to severity of illness   Dispo: The patient is from: Home              Anticipated d/c is to: LTAC              Anticipated d/c date is: 3 days              Patient currently is not medically stable to d/c.           Subjective:   No complaints.  No shortness of breath or chest pain.  He used BiPAP last night.  Interval events noted.  Objective:    Vitals:   05/29/20 0544 05/29/20 0600 05/29/20 0737 05/29/20 1127  BP:   (!) 155/96 (!) 143/88  Pulse:   93 (!) 110  Resp: 15 18 18 18   Temp:   99.5 F (37.5 C) 98.3 F (36.8 C)  TempSrc:   Oral Oral  SpO2:   100% 94%  Weight:      Height:       No data found.   Intake/Output Summary (Last 24 hours) at 05/29/2020 1547 Last data filed at 05/29/2020 1134 Gross per 24 hour  Intake 357 ml  Output 1900 ml  Net -1543 ml   Filed Weights   05/27/20 0500 05/28/20 0339 05/29/20 0417  Weight: (!) 171.2 kg (!) 167.4 kg (!) 162.7 kg    Exam:   GEN: NAD, Morbidly obese SKIN: Warm and dry EYES: EOMI ENT: MMM CV: RRR PULM: CTA B ABD: soft, obese, NT, +BS CNS: AAO x 3, non focal EXT: No edema or tenderness         Data Reviewed:   I have personally reviewed following labs and  imaging studies:  Labs: Labs show the following:   Basic Metabolic Panel: Recent Labs  Lab 05/24/20 0535 05/24/20 0535 05/25/20 0345 05/25/20 0345 05/26/20 0543 05/26/20 0543 05/27/20 0452 05/28/20 0405 05/29/20 0507  NA 143  --  144  --  141  --  141 143  --   K 3.5   < > 3.6   < > 4.0   < > 3.6 3.5  --   CL 102  --  103  --  103  --  101 101  --   CO2 33*  --  28  --  29  --  31 32  --   GLUCOSE 128*  --  130*  --  154*  --  131* 122*  --   BUN 30*  --  26*  --  25*  --  28* 29*  --   CREATININE 0.65  --  0.54*  --  0.47*  --  0.60* 0.60*  --   CALCIUM 8.7*  --  8.6*  --  9.0  --  9.0 8.9  --   MG 2.2   < > 2.1  --  2.2  --  2.2 2.3 2.3  PHOS 4.0   < > 3.8  --  4.6  --  4.8* 4.9* 4.6   < > = values in this interval not displayed.   GFR Estimated Creatinine Clearance: 202.3 mL/min (A) (by C-G formula based on SCr of 0.6 mg/dL (L)). Liver Function Tests: No results for input(s): AST, ALT, ALKPHOS, BILITOT, PROT, ALBUMIN in the last  168 hours. No results for input(s): LIPASE, AMYLASE in the last 168 hours. No results for input(s): AMMONIA in the last 168 hours. Coagulation profile No results for input(s): INR, PROTIME in the last 168 hours.  CBC: Recent Labs  Lab 05/25/20 0345 05/26/20 0543 05/27/20 0452 05/28/20 0405 05/29/20 0507  WBC 8.0 7.6 6.4 5.8 5.3  NEUTROABS 5.7 5.9 4.4 3.7 3.5  HGB 9.7* 10.8* 10.5* 10.9* 10.7*  HCT 32.1* 34.7* 33.8* 35.5* 34.7*  MCV 92.0 91.3 91.8 91.7 91.3  PLT 248 253 263 260 232   Cardiac Enzymes: No results for input(s): CKTOTAL, CKMB, CKMBINDEX, TROPONINI in the last 168 hours. BNP (last 3 results) No results for input(s): PROBNP in the last 8760 hours. CBG: Recent Labs  Lab 05/28/20 1919 05/28/20 2308 05/29/20 0402 05/29/20 0740 05/29/20 1129  GLUCAP 161* 122* 111* 135* 192*   D-Dimer: No results for input(s): DDIMER in the last 72 hours. Hgb A1c: No results for input(s): HGBA1C in the last 72 hours. Lipid  Profile: No results for input(s): CHOL, HDL, LDLCALC, TRIG, CHOLHDL, LDLDIRECT in the last 72 hours. Thyroid function studies: No results for input(s): TSH, T4TOTAL, T3FREE, THYROIDAB in the last 72 hours.  Invalid input(s): FREET3 Anemia work up: No results for input(s): VITAMINB12, FOLATE, FERRITIN, TIBC, IRON, RETICCTPCT in the last 72 hours. Sepsis Labs: Recent Labs  Lab 05/26/20 0543 05/27/20 0452 05/28/20 0405 05/29/20 0507  WBC 7.6 6.4 5.8 5.3    Microbiology No results found for this or any previous visit (from the past 240 hour(s)).  Procedures and diagnostic studies:  No results found.             LOS: 26 days   Liliani Bobo  [facility] adjusterHospitalists   Pager on [email or web address]. If 7PM-7AM, please contact night-coverage at [email or web address]     05/29/2020, 3:47 PM

## 2020-05-29 NOTE — [place] FL2 (Signed)
[place] MEDICAID FL2 LEVEL OF CARE SCREENING TOOL     IDENTIFICATION  Patient Name: Collin Brown Birthdate: Aug 14, 1985 Sex: male Admission Date (Current Location): 05/03/2020  [place] Number:  Chiropodist and Address:  [facility], [address] [place], [place] [address]      Provider Number: [id number]  Attending Physician Name and Address:  Lurene Shadow, MD  Relative Name and Phone Number:       Current Level of Care: Hospital Recommended Level of Care: Skilled Nursing Facility Prior Approval Number:    Date Approved/Denied:   PASRR Number: [id number]  Discharge Plan: SNF    Current Diagnoses: Patient Active Problem List   Diagnosis Date Noted  . Pressure injury of skin 05/13/2020  . Pneumonia due to COVID-19 virus 05/04/2020  . Acute hypoxemic respiratory failure due to COVID-19 (HCC) 05/03/2020  . Sleep apnea     Orientation RESPIRATION BLADDER Height & Weight     Self, Place, Time  O2 ([place] 7L) External catheter, Incontinent (placed 10/14) Weight: (!) 358 lb 11 oz (162.7 kg) Height:  5\' 10"  (177.8 cm)  BEHAVIORAL SYMPTOMS/MOOD NEUROLOGICAL BOWEL NUTRITION STATUS      Continent Diet (DYS 3 diet, nectar thick liquids)  AMBULATORY STATUS COMMUNICATION OF NEEDS Skin   Extensive Assist Verbally PU Stage and Appropriate Care   PU Stage 2 Dressing:  (right toe, foam dressing, change PRN. buttocks, foam dressing)                   Personal Care Assistance Level of Assistance  Bathing, Feeding, Dressing Bathing Assistance: Maximum assistance Feeding assistance: Independent Dressing Assistance: Maximum assistance     Functional Limitations Info  Sight, Hearing, Speech Sight Info: Adequate Hearing Info: Adequate Speech Info: Adequate    SPECIAL CARE FACTORS FREQUENCY  PT (By licensed PT), OT (By licensed OT)     PT Frequency: 5x OT Frequency: 5x            Contractures Contractures Info:  Not present    Additional Factors Info  Code Status, Allergies Code Status Info: full code Allergies Info: no known allergies           Current Medications (05/29/2020):  This is the current hospital active medication list Current Facility-Administered Medications  Medication Dose Route Frequency Provider Last Rate Last Admin  . 0.9 %  sodium chloride infusion   Intravenous PRN 05/31/2020, MD   Stopped at 05/22/20 [phone]  . acetaminophen (TYLENOL) tablet 1,000 mg  1,000 mg Oral Q6H PRN 2706, MD   1,000 mg at 05/28/20 2130  . ascorbic acid (VITAMIN C) tablet 500 mg  500 mg Oral Daily 2131, MD   500 mg at 05/29/20 1011  . aspirin chewable tablet 81 mg  81 mg Oral Daily 05/31/20, MD   81 mg at 05/29/20 1012  . chlorhexidine (PERIDEX) 0.12 % solution 15 mL  15 mL Mouth Rinse BID 05/31/20, MD   15 mL at 05/29/20 1011  . Chlorhexidine Gluconate Cloth 2 % PADS 6 each  6 each Topical Q0600 05/31/20, MD   6 each at 05/29/20 1053  . dextrose 50 % solution 0-50 mL  0-50 mL Intravenous PRN 07-23-1974, MD      . enoxaparin (LOVENOX) injection 85 mg  0.5 mg/kg Subcutaneous Q24H Lurene Shadow, MD   85 mg at 05/28/20 2130  . famotidine (PEPCID) tablet 20 mg  20 mg Oral Daily  Lurene Shadow, MD   20 mg at 05/29/20 1011  . feeding supplement (NEPRO CARB STEADY) liquid 237 mL  237 mL Oral TID BM Lurene Shadow, MD 0 mL/hr at 05/28/20 2230 237 mL at 05/29/20 1020  . furosemide (LASIX) injection 40 mg  40 mg Intravenous Daily Lurene Shadow, MD   40 mg at 05/29/20 1012  . ibuprofen (ADVIL) tablet 600 mg  600 mg Oral Q6H PRN Andris Baumann, MD   600 mg at 05/28/20 0106  . insulin aspart (novoLOG) injection 0-20 Units  0-20 Units Subcutaneous Q4H Lurene Shadow, MD   4 Units at 05/29/20 1220  . insulin detemir (LEVEMIR) injection 20 Units  20 Units Subcutaneous BID Lurene Shadow, MD   20 Units at 05/29/20 1011  . MEDLINE mouth rinse  15 mL Mouth Rinse q12n4p Lurene Shadow, MD   15 mL at 05/28/20 1611  . morphine 2 MG/ML injection 1 mg  1 mg Intravenous Q4H PRN Lurene Shadow, MD   1 mg at 05/27/20 0534  . multivitamin with minerals tablet 1 tablet  1 tablet Oral Daily Lurene Shadow, MD   1 tablet at 05/29/20 1011  . polyethylene glycol (MIRALAX / GLYCOLAX) packet 17 g  17 g Oral Daily Lurene Shadow, MD   17 g at 05/29/20 1012  . sodium chloride flush (NS) 0.9 % injection 10-40 mL  10-40 mL Intracatheter PRN Lurene Shadow, MD   10 mL at 05/28/20 0857     Discharge Medications: Please see discharge summary for a list of discharge medications.  Relevant Imaging Results:  Relevant Lab Results:   Additional Information SSN:[id number]  Maree Krabbe, LCSW

## 2020-05-30 DIAGNOSIS — K5900 Constipation, unspecified: Secondary | ICD-10-CM

## 2020-05-30 DIAGNOSIS — J9601 Acute respiratory failure with hypoxia: Secondary | ICD-10-CM | POA: Diagnosis not present

## 2020-05-30 DIAGNOSIS — G4733 Obstructive sleep apnea (adult) (pediatric): Secondary | ICD-10-CM | POA: Diagnosis not present

## 2020-05-30 DIAGNOSIS — U071 COVID-19: Secondary | ICD-10-CM | POA: Diagnosis not present

## 2020-05-30 LAB — MAGNESIUM: Magnesium: 2.1 mg/dL (ref 1.7–2.4)

## 2020-05-30 LAB — CBC WITH DIFFERENTIAL/PLATELET
Abs Immature Granulocytes: 0.04 10*3/uL (ref 0.00–0.07)
Basophils Absolute: 0 10*3/uL (ref 0.0–0.1)
Basophils Relative: 0 %
Eosinophils Absolute: 0.3 10*3/uL (ref 0.0–0.5)
Eosinophils Relative: 5 %
HCT: 36 % — ABNORMAL LOW (ref 39.0–52.0)
Hemoglobin: 11.4 g/dL — ABNORMAL LOW (ref 13.0–17.0)
Immature Granulocytes: 1 %
Lymphocytes Relative: 23 %
Lymphs Abs: 1.3 10*3/uL (ref 0.7–4.0)
MCH: 28.3 pg (ref 26.0–34.0)
MCHC: 31.7 g/dL (ref 30.0–36.0)
MCV: 89.3 fL (ref 80.0–100.0)
Monocytes Absolute: 0.4 10*3/uL (ref 0.1–1.0)
Monocytes Relative: 8 %
Neutro Abs: 3.7 10*3/uL (ref 1.7–7.7)
Neutrophils Relative %: 63 %
Platelets: 221 10*3/uL (ref 150–400)
RBC: 4.03 MIL/uL — ABNORMAL LOW (ref 4.22–5.81)
RDW: 18.3 % — ABNORMAL HIGH (ref 11.5–15.5)
WBC: 5.8 10*3/uL (ref 4.0–10.5)
nRBC: 0 % (ref 0.0–0.2)

## 2020-05-30 LAB — GLUCOSE, CAPILLARY
Glucose-Capillary: 126 mg/dL — ABNORMAL HIGH (ref 70–99)
Glucose-Capillary: 120 mg/dL — ABNORMAL HIGH (ref 70–99)
Glucose-Capillary: 238 mg/dL — ABNORMAL HIGH (ref 70–99)
Glucose-Capillary: 160 mg/dL — ABNORMAL HIGH (ref 70–99)
Glucose-Capillary: 147 mg/dL — ABNORMAL HIGH (ref 70–99)
Glucose-Capillary: 121 mg/dL — ABNORMAL HIGH (ref 70–99)

## 2020-05-30 LAB — PHOSPHORUS: Phosphorus: 4.5 mg/dL (ref 2.5–4.6)

## 2020-05-30 MED ORDER — POLYETHYLENE GLYCOL 3350 17 G PO PACK
17.0000 g | PACK | Freq: Two times a day (BID) | ORAL | Status: DC
  Administered 2020-05-30 – 2020-06-08 (×15): 17 g via ORAL
  Filled 2020-05-30 (×17): qty 1

## 2020-05-30 MED ORDER — SENNA 8.6 MG PO TABS
1.0000 | ORAL_TABLET | Freq: Two times a day (BID) | ORAL | Status: DC
  Administered 2020-05-30 – 2020-06-08 (×19): 8.6 mg via ORAL
  Filled 2020-05-30 (×19): qty 1

## 2020-05-30 NOTE — Progress Notes (Addendum)
PROGRESS NOTE  Collin Brown WUJ:[id number] DOB: 08-27-1985 DOA: 05/03/2020 PCP: Patient, No Pcp Per   LOS: 27 days   Brief narrative: As per HPI,  Collin Brown is a 34 y.o. male with medical history significant for obstructive sleep apnea, morbid obesity, presented to the hospital on May 03, 2020 with cough, shortness of breath, generalized weakness, fever and a positive Covid 19 test. He was admitted to the medical floor for acute hypoxemic respiratory failure secondary to COVID-19 pneumonia. He was treated with IV remdesivir and IV steroids. Baricitinib was not given because of leukopenia. His condition deteriorated and hypoxemia worsened. He was transferred to the ICU on May 05, 2020 where he was initially treated with BiPAP. However, he failed BiPAP therapy and so he was eventually intubated and placed on mechanical ventilation on May 05, 2020. He self extubated on May 20, 2020 was transitioned to BiPAP. Hospital course was complicated by acute metabolic encephalopathy and ARDS. His condition slowly improved and was transitioned to oxygenation via high flow nasal cannula. His level of care was downgraded from ICU status to stepdown status and the hospitalist team was consulted to continue with medical management.  Assessment/Plan:  Principal Problem:   Acute hypoxemic respiratory failure due to COVID-19 [facility]) Active Problems:   Sleep apnea   Pneumonia due to COVID-19 virus   Pressure injury of skin   COVID-19 pneumonia with ARDS: Completed treatment with IV steroids.  Post intubation subsequently extubated and on BiPAP at nighttime and high flow nasal cannula during the daytime.  Acute hypoxemic respiratory failure with ARDS: Self extubated on 05/20/2020.  On BiPAP at nighttime.  On high flow nasal cannula during daytime.  Continue supportive care.  Off IV Lasix.  Type II DM: Hemoglobin A1c was 7.2.  Continue Levemir and Humalog as needed for hyperglycemia.   Lipid panel noted  with elevated total cholesterol LDL was 153.  On the account of diabetes he would benefit from a moderate intensity statin.  Obstructive sleep apnea/chronic hypercapnic respiratory failure/probable OHS: Continue BiPAP at night   Extreme Debility, deconditioning.  Continue PT OT.  At this time patient has been considered for skilled nursing facility.  Severe constipation. For the last one week. Will adjust laxatives. Increase MiraLAX to twice a day.  Add Senokot.  Good appetite, no vomiting or abdominal pain. Will need continued PT and mobilization.  If no bowel movement by tomorrow, consider magnesium citrate.  Pressure ulceration. Present on admission Continue Wound care.   Pressure Injury 05/12/20 Toe (Comment  which one) Anterior;Right Stage 2 -  Partial thickness loss of dermis presenting as a shallow open injury with a red, pink wound bed without slough. (Active)  05/12/20 1930  Location: Toe (Comment  which one)  Location Orientation: Anterior;Right  Staging: Stage 2 -  Partial thickness loss of dermis presenting as a shallow open injury with a red, pink wound bed without slough.  Wound Description (Comments):   Present on Admission: No     Pressure Injury 05/12/20 Toe (Comment  which one) Anterior;Left Stage 2 -  Partial thickness loss of dermis presenting as a shallow open injury with a red, pink wound bed without slough. (Active)  05/12/20 1930  Location: Toe (Comment  which one)  Location Orientation: Anterior;Left  Staging: Stage 2 -  Partial thickness loss of dermis presenting as a shallow open injury with a red, pink wound bed without slough.  Wound Description (Comments):   Present on Admission: No  Pressure Injury 05/21/20 Buttocks Right;Left purple/white (Active)  05/21/20 2123  Location: Buttocks  Location Orientation: Right;Left  Staging:   Wound Description (Comments): purple/white  Present on Admission:     DVT prophylaxis: : Subcu Lovenox   Code  Status: Full code  Family Communication:  I spoke with the patient's mother and updated her about the clinical condition of the patient.   Status is: Inpatient  Remains inpatient appropriate because:IV treatments appropriate due to intensity of illness or inability to take PO, Inpatient level of care appropriate due to severity of illness and Awaiting for skilled nursing facility placement.   Dispo: The patient is from: Home              Anticipated d/c is to: SNF              Anticipated d/c date is: 2 days              Patient currently is medically stable to d/c.  Consultants:  Critical care  Procedures:  Intubation mechanical ventilation on May 05, 2020  Self extubated on May 20, 2020  BiPAP placement  Antibiotics:  . None at this time  Anti-infectives (From admission, onward)   Start     Dose/Rate Route Frequency Ordered Stop   05/16/20 1800  Ampicillin-Sulbactam (UNASYN) 3 g in sodium chloride 0.9 % 100 mL IVPB  Status:  Discontinued        3 g 200 mL/hr over 30 Minutes Intravenous Every 6 hours 05/16/20 1458 05/22/20 1008   05/14/20 0600  ceFEPIme (MAXIPIME) 2 g in sodium chloride 0.9 % 100 mL IVPB  Status:  Discontinued        2 g 200 mL/hr over 30 Minutes Intravenous Every 8 hours 05/14/20 0533 05/16/20 1458   05/07/20 1200  meropenem (MERREM) 1 g in sodium chloride 0.9 % 100 mL IVPB  Status:  Discontinued        1 g 200 mL/hr over 30 Minutes Intravenous Every 8 hours 05/07/20 1020 05/09/20 1019   05/06/20 1200  vancomycin (VANCOCIN) IVPB 1000 mg/200 mL premix  Status:  Discontinued        1,000 mg 200 mL/hr over 60 Minutes Intravenous Every 8 hours 05/05/20 2330 05/07/20 1020   05/06/20 0200  vancomycin (VANCOREADY) IVPB 500 mg/100 mL       "Followed by" Linked Group Details   500 mg 100 mL/hr over 60 Minutes Intravenous  Once 05/05/20 2330 05/06/20 0454   05/06/20 0000  vancomycin (VANCOREADY) IVPB 2000 mg/400 mL       "Followed by" Linked Group Details    2,000 mg 200 mL/hr over 120 Minutes Intravenous  Once 05/05/20 2330 05/06/20 0304   05/05/20 2315  cefTRIAXone (ROCEPHIN) 2 g in sodium chloride 0.9 % 100 mL IVPB  Status:  Discontinued        2 g 200 mL/hr over 30 Minutes Intravenous Daily at 10 pm 05/05/20 2307 05/07/20 1020   05/04/20 1000  remdesivir 100 mg in sodium chloride 0.9 % 100 mL IVPB       "Followed by" Linked Group Details   100 mg 200 mL/hr over 30 Minutes Intravenous Daily 05/03/20 1718 05/07/20 0941   05/03/20 1800  remdesivir 200 mg in sodium chloride 0.9% 250 mL IVPB       "Followed by" Linked Group Details   200 mg 580 mL/hr over 30 Minutes Intravenous Once 05/03/20 1718 05/03/20 1941      Subjective: Today, patient was seen  and examined at bedside.  Patient complains of constipation for the last 7 days.  No nausea, vomiting or abdominal pain.  Has been eating okay.  Feels extremely deconditioned and weak.   Objective: Vitals:   05/30/20 0500 05/30/20 0814  BP:  (!) 163/85  Pulse:  92  Resp: 15 17  Temp:  98.1 F (36.7 C)  SpO2:  98%    Intake/Output Summary (Last 24 hours) at 05/30/2020 0934 Last data filed at 05/30/2020 0440 Gross per 24 hour  Intake 480 ml  Output 2300 ml  Net -1820 ml   Filed Weights   05/28/20 0339 05/29/20 0417 05/30/20 0440  Weight: (!) 167.4 kg (!) 162.7 kg (!) 161.3 kg   Body mass index is 51.04 kg/m.   Physical Exam:  GENERAL: Patient is alert awake and oriented. Not in obvious distress.  Morbidly obese.  On high flow nasal cannula. HENT: No scleral pallor or icterus. Pupils equally reactive to light. Oral mucosa is moist NECK: is supple, no gross swelling noted. CHEST: Clear to auscultation. No crackles or wheezes.  Diminished breath sounds bilaterally. CVS: S1 and S2 heard, no murmur. Regular rate and rhythm.  ABDOMEN: Soft, non-tender, obese abdomen bowel sounds are present. EXTREMITIES: No edema. CNS: Cranial nerves are intact.  Generalized weakness noted mostly  on the left upper extremity SKIN: warm and dry without rashes.  Data Review: I have personally reviewed the following laboratory data and studies,  CBC: Recent Labs  Lab 05/26/20 0543 05/27/20 0452 05/28/20 0405 05/29/20 0507 05/30/20 0427  WBC 7.6 6.4 5.8 5.3 5.8  NEUTROABS 5.9 4.4 3.7 3.5 3.7  HGB 10.8* 10.5* 10.9* 10.7* 11.4*  HCT 34.7* 33.8* 35.5* 34.7* 36.0*  MCV 91.3 91.8 91.7 91.3 89.3  PLT 253 263 260 232 221   Basic Metabolic Panel: Recent Labs  Lab 05/24/20 0535 05/24/20 0535 05/25/20 0345 05/25/20 0345 05/26/20 0543 05/27/20 0452 05/28/20 0405 05/29/20 0507 05/30/20 0427  NA 143  --  144  --  141 141 143  --   --   K 3.5  --  3.6  --  4.0 3.6 3.5  --   --   CL 102  --  103  --  103 101 101  --   --   CO2 33*  --  28  --  29 31 32  --   --   GLUCOSE 128*  --  130*  --  154* 131* 122*  --   --   BUN 30*  --  26*  --  25* 28* 29*  --   --   CREATININE 0.65  --  0.54*  --  0.47* 0.60* 0.60*  --   --   CALCIUM 8.7*  --  8.6*  --  9.0 9.0 8.9  --   --   MG 2.2   < > 2.1   < > 2.2 2.2 2.3 2.3 2.1  PHOS 4.0   < > 3.8   < > 4.6 4.8* 4.9* 4.6 4.5   < > = values in this interval not displayed.   Liver Function Tests: No results for input(s): AST, ALT, ALKPHOS, BILITOT, PROT, ALBUMIN in the last 168 hours. No results for input(s): LIPASE, AMYLASE in the last 168 hours. No results for input(s): AMMONIA in the last 168 hours. Cardiac Enzymes: No results for input(s): CKTOTAL, CKMB, CKMBINDEX, TROPONINI in the last 168 hours. BNP (last 3 results) Recent Labs    05/03/20 1633 05/16/20 1435  BNP 31.1 77.7    ProBNP (last 3 results) No results for input(s): PROBNP in the last 8760 hours.  CBG: Recent Labs  Lab 05/29/20 1621 05/29/20 1950 05/29/20 2317 05/30/20 0438 05/30/20 0831  GLUCAP 145* 158* 110* 126* 120*   No results found for this or any previous visit (from the past 240 hour(s)).   Studies: No results found.    Joycelyn Das, MD  [facility] 05/30/2020

## 2020-05-30 NOTE — Progress Notes (Signed)
Physical Therapy Treatment Patient Details Name: Collin Brown MRN: [id number] DOB: [birth year] Today's Date: 05/30/2020    History of Present Illness Collin Brown is a34yoM who comes to [facility] on 9/23 after progression of dyspnea. Pt recently (+)COVID19 along with other family. Pt required intubation on 9/25, self extubated on 10/10. At evaluation pt presenting on HHHF '@55L' /min, 90%FiO2.    PT Comments    Pt in bed upon entry, agreeable to treatment. Pt continues to show steady linear progression in global strength of limbs, core, and phonation, now easily able to express ideas verbally. Max-totalA+2 bed mobility to EOB, pt able to sit unsupported for 6 minutes, nearly twice as long as 2 DA. Pt then takes part in postural sway exercise. Pt fatigues, then takes part in supine leg strengthening. Pt in 7L HFNC throughout session, no dyspnea, no desaturation. During exertion pt's HR increases to 120s, but recovers to 90s with rest. Pt reports normal sensation in LUE now, continues to have intermittent pain in Left shoulder, and this date has pain in Left elbow with gentle ranging. Pt set up in bed at end of session with meal presented, all needs met. MD/RN made aware of persistent LUE motor loss.     Follow Up Recommendations  CIR     Equipment Recommendations  Other (comment) (defer to next setting. Pt still unable to stand or transfer without lift)    Recommendations for Other Services Rehab consult     Precautions / Restrictions Precautions Precautions: Fall Precaution Comments: flaccid LUE (protect from subluxation)    Mobility  Bed Mobility Overal bed mobility: Needs Assistance Bed Mobility: Supine to Sit;Sit to Supine Rolling: +2 for safety/equipment;Max assist   Supine to sit: Max assist;+2 for physical assistance Sit to supine: Total assist;+2 for physical assistance;+2 for safety/equipment   General bed mobility comments: Pt uses RUE to hold LUE (failure after fatigue);  pt toelrates sittign without support after made plumb and square  Transfers Overall transfer level:  (Unsafe to attempt this date.)                  Ambulation/Gait                 Stairs             Wheelchair Mobility    Modified Rankin (Stroke Patients Only)       Balance Overall balance assessment: Needs assistance Sitting-balance support: No upper extremity supported;Feet supported Sitting balance-Leahy Scale: Good                                      Cognition Arousal/Alertness: Awake/alert Behavior During Therapy: WFL for tasks assessed/performed Overall Cognitive Status: Within Functional Limits for tasks assessed                                        Exercises Total Joint Exercises Bridges: Strengthening;Both;10 reps;Supine;Limitations Bridges Limitations: 2x10 bilat (unable to clear buttocks, but demonstrates movement of trunk) General Exercises - Upper Extremity Elbow Flexion: PROM;Left;20 reps;Supine Elbow Extension: PROM;Left;20 reps;Supine General Exercises - Lower Extremity Short Arc Quad: AAROM;Both;15 reps;Supine Heel Slides: AAROM;Both;Supine;10 reps;Limitations Heel Slides Limitations: 2x10 bilat Straight Leg Raises: AAROM;Both;10 reps;Supine;Limitations Straight Leg Raises Limitations: 2x10 bilat (modA Rt; MaxA Left) Other Exercises Other Exercises: Seated EOB x 6 minutes, hands free Other  Exercises: Hands free trunk lean and return to upright: 10x FWD, 10x Rt, 10x Lt    General Comments        Pertinent Vitals/Pain Pain Assessment: Faces Faces Pain Scale: Hurts even more Pain Location: Left elbow with ranging Pain Descriptors / Indicators: Aching;Sharp Pain Intervention(s): Limited activity within patient's tolerance;Monitored during session;Repositioned    Home Living                      Prior Function            PT Goals (current goals can now be found in the care  plan section) Acute Rehab PT Goals Patient Stated Goal: regain strength and mobilty and return to work/independence PT Goal Formulation: With patient Time For Goal Achievement: 06/18/20 Potential to Achieve Goals: Fair Progress towards PT goals: Progressing toward goals    Frequency    Min 2X/week      PT Plan Current plan remains appropriate    Co-evaluation              AM-PAC PT "6 Clicks" Mobility   Outcome Measure  Help needed turning from your back to your side while in a flat bed without using bedrails?: Total Help needed moving from lying on your back to sitting on the side of a flat bed without using bedrails?: Total Help needed moving to and from a bed to a chair (including a wheelchair)?: Total Help needed standing up from a chair using your arms (e.g., wheelchair or bedside chair)?: Total Help needed to walk in hospital room?: Total Help needed climbing 3-5 steps with a railing? : Total 6 Click Score: 6    End of Session Equipment Utilized During Treatment: Oxygen Activity Tolerance: Patient tolerated treatment well;No increased pain;Patient limited by pain Patient left: in bed;with call bell/phone within reach Nurse Communication: Mobility status PT Visit Diagnosis: Difficulty in walking, not elsewhere classified (R26.2);Other abnormalities of gait and mobility (R26.89);Muscle weakness (generalized) (M62.81)     Time: 8592-9244 PT Time Calculation (min) (ACUTE ONLY): 48 min  Charges:  $Therapeutic Exercise: 38-52 mins                     4:22 PM, 05/30/20 [name], PT, DPT Physical Therapist - [facility]  [phone] ([place])    Collin Brown C 05/30/2020, 4:07 PM

## 2020-05-30 NOTE — Plan of Care (Signed)
  Problem: Clinical Measurements: Goal: Respiratory complications will improve Outcome: Progressing   Problem: Activity: Goal: Risk for activity intolerance will decrease Outcome: Progressing   Problem: Safety: Goal: Ability to remain free from injury will improve Outcome: Progressing   

## 2020-05-30 NOTE — Plan of Care (Signed)
Discussed with patient in front of mom plan of care for the evening, pain management and receiving a bath before bedtime with some teach back displayed

## 2020-05-30 NOTE — Progress Notes (Signed)
Occupational Therapy Treatment Patient Details Name: Collin Brown MRN: [id number] DOB: Apr 26, 1986 Today's Date: 05/30/2020    History of present illness Collin Brown is a34yoM who comes [facility] on 9/[facility] after progression of dyspnea. Pt recently (+)COVID19 along with other family. Pt required intubation on 9/25, self extubated on 10/10. At evaluation pt presenting on HHHF @55L /min, 90%FiO2.   OT comments  Upon entering the room, pt supine in bed with no c/o pain and agreeable to OT intervention. Pt requesting to sit EOB as part of session as he was encouraged from doing this earlier today. Pt able to transition L LE off bed and R LE partially. Pt unable to reach across body to bed rail with R UE secondary to body habitus. Max A for trunk support and to square up pt on EOB. Pt maintains seated balance for 10 minutes while washing face and brushing teeth with R UE. Pt needing set up A to obtain all needed items. Pt able to brush teeth without need of built up handle on brush! Pt did begin to fatigue after 10 minutes and returned to bed with max A. He was able to get R LE back into bed on his own with increased time. Hospital bed placed into [place] position and pt reaching up with R UE to assist therapist with repositioning in bed. Pt with minimal extension and flexion of L digits. PROM for digits, wrist, and elbow in all planes of movement x 10 reps. Pt does grimace at ~ 100 degrees of elbow flexion. Pt repositioned for comfort with care of UE placement to protect. All needs within reach.   Follow Up Recommendations  CIR    Equipment Recommendations  Other (comment) (defer to next venue of care)    Recommendations for Other Services      Precautions / Restrictions Precautions Precautions: Fall Precaution Comments: flaccid LUE (protect from subluxation)       Mobility Bed Mobility Overal bed mobility: Needs Assistance Bed Mobility: Supine to Sit Rolling: max A   Supine to sit:  Max assist;HOB elevated Sit to supine: Max A   General bed mobility comments: Pt unable to reach R UE across body for rail but able to move L LE off edge of bed and R LE partially off. Assistance to square up hips on EOB  Transfers Overall transfer level:  (Unsafe to attempt this date.)        Balance Overall balance assessment: Needs assistance Sitting-balance support: No upper extremity supported;Feet supported Sitting balance-Leahy Scale: Good              ADL either performed or assessed with clinical judgement   ADL Overall ADL's : Needs assistance/impaired       General ADL Comments: Pt sitting EOB for 10 minutes while able to wash face and brush teeth with use of R UE with set up A.     Vision Patient Visual Report: No change from baseline            Cognition Arousal/Alertness: Awake/alert Behavior During Therapy: WFL for tasks assessed/performed Overall Cognitive Status: Within Functional Limits for tasks assessed         Following Commands: Follows one step commands with increased time     Problem Solving: Slow processing          Exercises Total Joint Exercises Bridges: Strengthening;Both;10 reps;Supine;Limitations Bridges Limitations: 2x10 bilat (unable to clear buttocks, but demonstrates movement of trunk) General Exercises - Upper Extremity Elbow Flexion: PROM;Left;20 reps;Supine  Elbow Extension: PROM;Left;20 reps;Supine General Exercises - Lower Extremity Short Arc Quad: AAROM;Both;15 reps;Supine Heel Slides: AAROM;Both;Supine;10 reps;Limitations Heel Slides Limitations: 2x10 bilat Straight Leg Raises: AAROM;Both;10 reps;Supine;Limitations Straight Leg Raises Limitations: 2x10 bilat (modA Rt; MaxA Left) Other Exercises Other Exercises: Seated EOB x 6 minutes, hands free Other Exercises: Hands free trunk lean and return to upright: 10x FWD, 10x Rt, 10x Lt           Pertinent Vitals/ Pain       Pain Assessment: Faces Faces Pain  Scale: Hurts even more Pain Location: Left elbow with ranging Pain Descriptors / Indicators: Aching;Sharp Pain Intervention(s): Limited activity within patient's tolerance;Repositioned         Frequency  Min 3X/week        Progress Toward Goals  OT Goals(current goals can now be found in the care plan section)  Progress towards OT goals: Progressing toward goals  Acute Rehab OT Goals Patient Stated Goal: Pt wants to return home and back to work when ready OT Goal Formulation: With patient Time For Goal Achievement: 06/04/20 Potential to Achieve Goals: Good  Plan Discharge plan remains appropriate;Frequency remains appropriate       AM-PAC OT "6 Clicks" Daily Activity     Outcome Measure   Help from another person eating meals?: A Little Help from another person taking care of personal grooming?: A Little Help from another person toileting, which includes using toliet, bedpan, or urinal?: Total Help from another person bathing (including washing, rinsing, drying)?: Total Help from another person to put on and taking off regular upper body clothing?: A Lot Help from another person to put on and taking off regular lower body clothing?: Total 6 Click Score: 11    End of Session    OT Visit Diagnosis: Other abnormalities of gait and mobility (R26.89);Muscle weakness (generalized) (M62.81);Other symptoms and signs involving cognitive function   Activity Tolerance Patient tolerated treatment well   Patient Left in bed;with call bell/phone within reach;with bed alarm set   Nurse Communication Mobility status        Time: 1324-4010 OT Time Calculation (min): 38 min  Charges: OT General Charges $OT Visit: 1 Visit OT Treatments $Self Care/Home Management : 8-22 mins $Therapeutic Activity: 23-37 mins  [name], MS, OTR/L , CBIS ascom [phone]  05/30/20, 4:30 PM

## 2020-05-31 ENCOUNTER — Inpatient Hospital Stay: Payer: BC Managed Care – PPO

## 2020-05-31 DIAGNOSIS — G4733 Obstructive sleep apnea (adult) (pediatric): Secondary | ICD-10-CM | POA: Diagnosis not present

## 2020-05-31 DIAGNOSIS — U071 COVID-19: Secondary | ICD-10-CM | POA: Diagnosis not present

## 2020-05-31 DIAGNOSIS — K5900 Constipation, unspecified: Secondary | ICD-10-CM | POA: Diagnosis not present

## 2020-05-31 DIAGNOSIS — J9601 Acute respiratory failure with hypoxia: Secondary | ICD-10-CM | POA: Diagnosis not present

## 2020-05-31 LAB — COMPREHENSIVE METABOLIC PANEL
ALT: 72 U/L — ABNORMAL HIGH (ref 0–44)
AST: 23 U/L (ref 15–41)
Albumin: 3.1 g/dL — ABNORMAL LOW (ref 3.5–5.0)
Alkaline Phosphatase: 63 U/L (ref 38–126)
Anion gap: 8 (ref 5–15)
BUN: 17 mg/dL (ref 6–20)
CO2: 34 mmol/L — ABNORMAL HIGH (ref 22–32)
Calcium: 8.8 mg/dL — ABNORMAL LOW (ref 8.9–10.3)
Chloride: 96 mmol/L — ABNORMAL LOW (ref 98–111)
Creatinine, Ser: 0.41 mg/dL — ABNORMAL LOW (ref 0.61–1.24)
GFR, Estimated: >60 mL/min (ref >60)
Glucose, Bld: 107 mg/dL — ABNORMAL HIGH (ref 70–99)
Potassium: 3.3 mmol/L — ABNORMAL LOW (ref 3.5–5.1)
Sodium: 138 mmol/L (ref 135–145)
Total Bilirubin: 0.7 mg/dL (ref 0.3–1.2)
Total Protein: 6 g/dL — ABNORMAL LOW (ref 6.5–8.1)

## 2020-05-31 LAB — CBC
HCT: 33.7 % — ABNORMAL LOW (ref 39.0–52.0)
Hemoglobin: 10.9 g/dL — ABNORMAL LOW (ref 13.0–17.0)
MCH: 28.9 pg (ref 26.0–34.0)
MCHC: 32.3 g/dL (ref 30.0–36.0)
MCV: 89.4 fL (ref 80.0–100.0)
Platelets: 172 10*3/uL (ref 150–400)
RBC: 3.77 MIL/uL — ABNORMAL LOW (ref 4.22–5.81)
RDW: 18.3 % — ABNORMAL HIGH (ref 11.5–15.5)
WBC: 5.6 10*3/uL (ref 4.0–10.5)
nRBC: 0 % (ref 0.0–0.2)

## 2020-05-31 LAB — GLUCOSE, CAPILLARY
Glucose-Capillary: 114 mg/dL — ABNORMAL HIGH (ref 70–99)
Glucose-Capillary: 117 mg/dL — ABNORMAL HIGH (ref 70–99)
Glucose-Capillary: 214 mg/dL — ABNORMAL HIGH (ref 70–99)
Glucose-Capillary: 137 mg/dL — ABNORMAL HIGH (ref 70–99)
Glucose-Capillary: 166 mg/dL — ABNORMAL HIGH (ref 70–99)

## 2020-05-31 IMAGING — MR MR HEAD W/O CM
12 series · 48 of 48 positions shown · non-contrast
Comparison: None.

CLINICAL DATA: Left arm weakness

EXAM:
MRI HEAD WITHOUT CONTRAST
TECHNIQUE: Multiplanar, multiecho pulse sequences of the brain and surrounding
structures were obtained without intravenous contrast.

[Series 5: ax dwi_tracew · axial · 3.0mm · 0.60mm/px · z∈[-31,+123]mm · 4 of 48 slices shown]
[im 1/48]
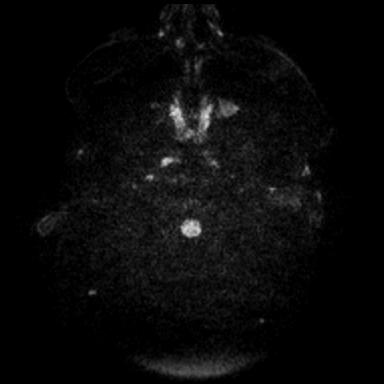
[im 16/48]
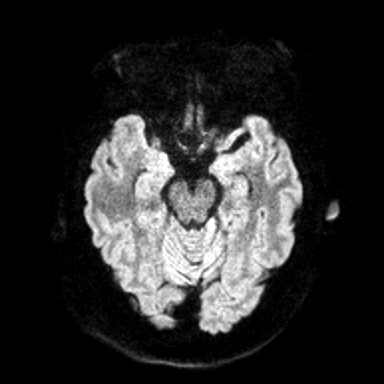
[im 32/48]
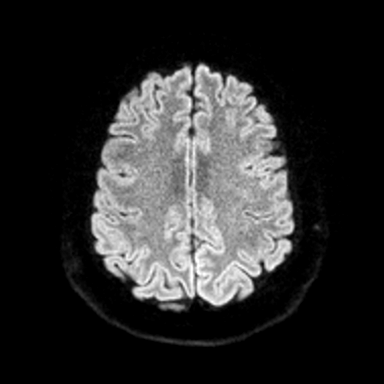
[im 48/48]
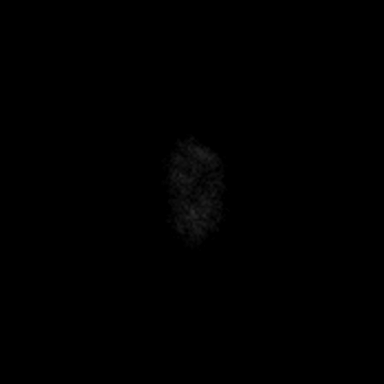

[Series 6: ax dwi_adc · axial · 3.0mm · 0.60mm/px · z∈[-31,+120]mm · 3 of 47 slices shown]
[im 1/47]
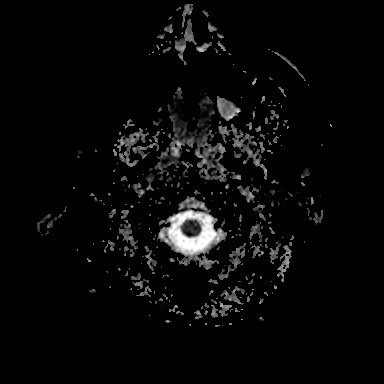
[im 24/47]
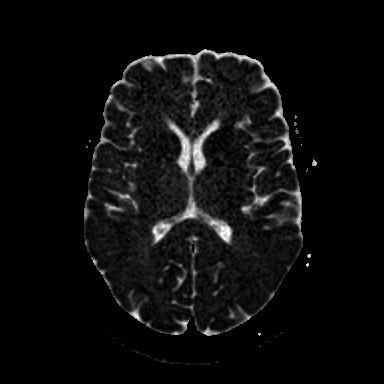
[im 47/47]
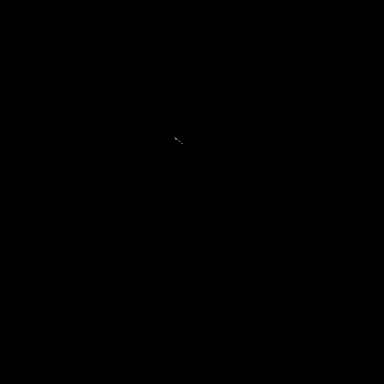

[Series 7: cor dwi_tracew · coronal · 5.0mm · 0.68mm/px · 3 of 40 slices shown]
[im 1/40]
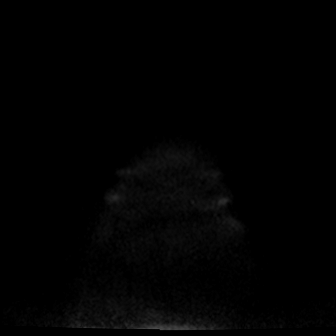
[im 20/40]
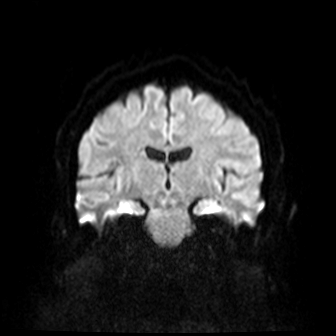
[im 40/40]
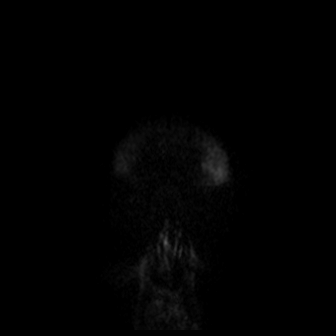

[Series 8: cor dwi_adc · coronal · 5.0mm · 0.68mm/px · 3 of 40 slices shown]
[im 1/40]
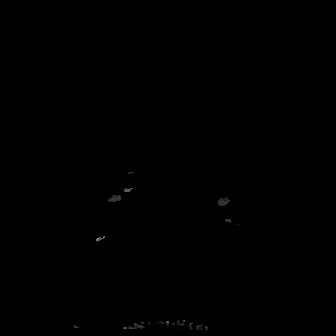
[im 20/40]
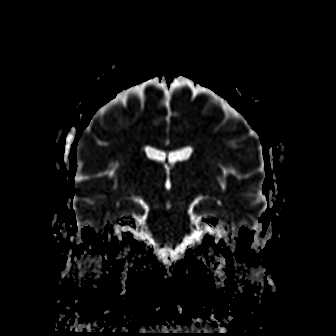
[im 40/40]
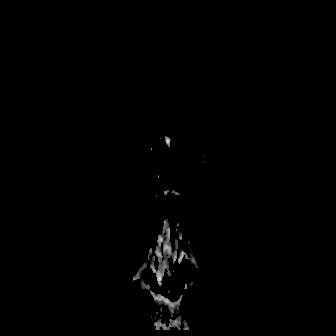

[Series 9: T1 · sagittal · 5.0mm · 0.62mm/px · 2 of 24 slices shown (1 of 2)]
[im 1/24]
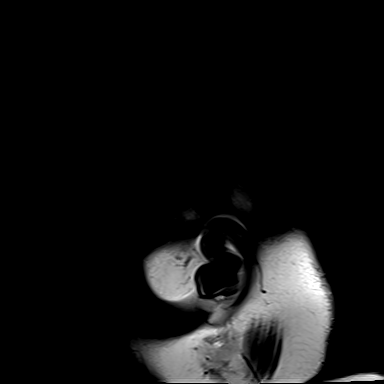
[im 24/24]
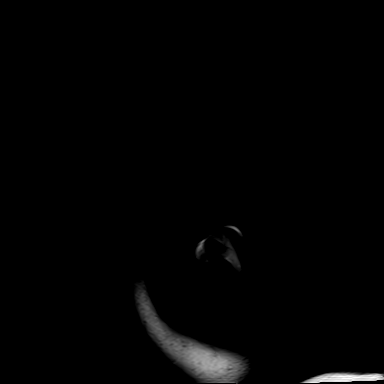

[Series 10: T2 · axial · 5.0mm · 0.53mm/px · z∈[-26,+117]mm · 2 of 25 slices shown (1 of 2)]
[im 1/25]
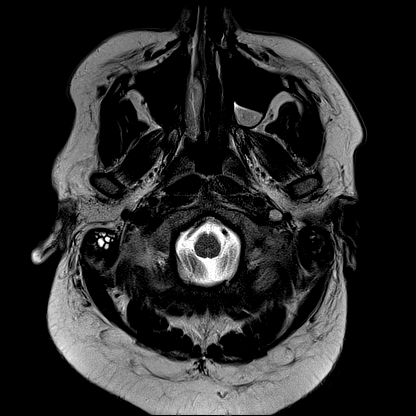
[im 25/25]
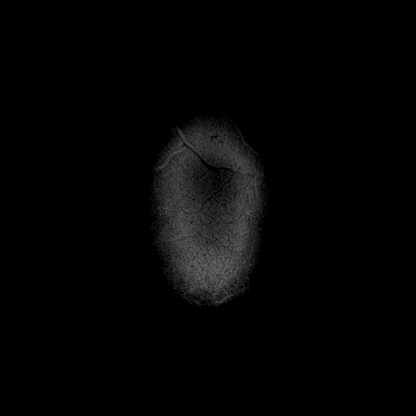

[Series 11: FLAIR · axial · 3.0mm · 1.20mm/px · z∈[-34,+127]mm · 4 of 55 slices shown]
[im 1/55]
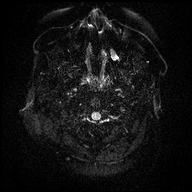
[im 19/55]
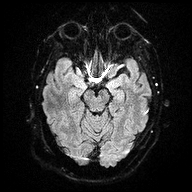
[im 37/55]
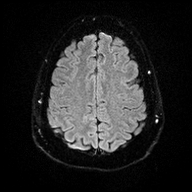
[im 55/55]
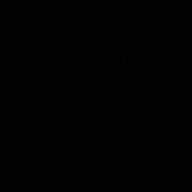

[Series 12: mag_images · axial · 3.0mm · 0.90mm/px · z∈[-35,+129]mm · 4 of 56 slices shown]
[im 1/56]
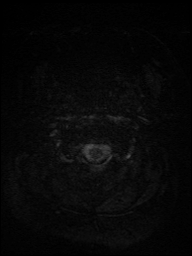
[im 19/56]
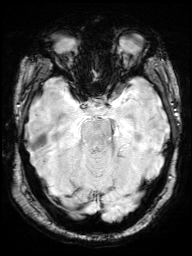
[im 37/56]
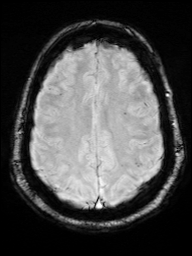
[im 56/56]
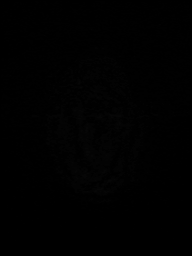

[Series 13: pha_images · axial · 3.0mm · 0.90mm/px · z∈[-35,+129]mm · 4 of 56 slices shown]
[im 1/56]
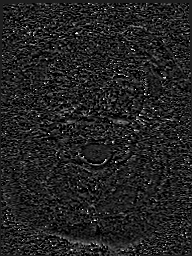
[im 19/56]
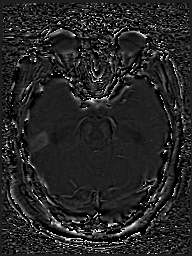
[im 37/56]
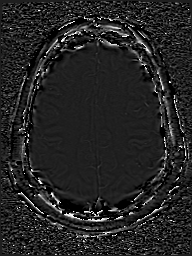
[im 56/56]
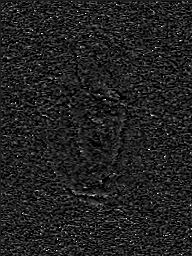

[Series 14: swi_images · axial · 3.0mm · 0.90mm/px · z∈[-35,+129]mm · 4 of 56 slices shown]
[im 1/56]
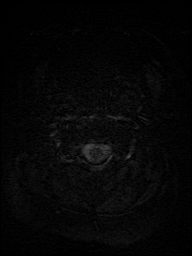
[im 19/56]
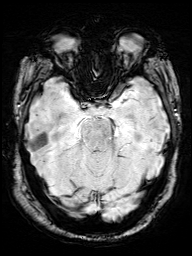
[im 37/56]
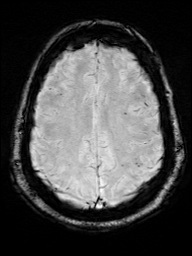
[im 56/56]
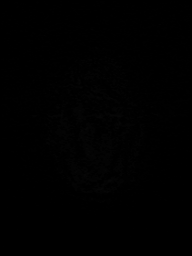

[Series 16: T1 · axial · 1.0mm · 0.98mm/px · z∈[-39,+132]mm · 13 of 170 slices shown (2 of 2)]
[im 1/170]
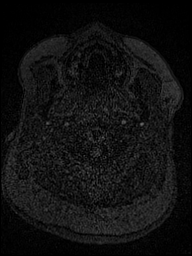
[im 15/170]
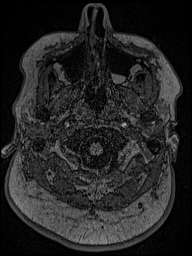
[im 29/170]
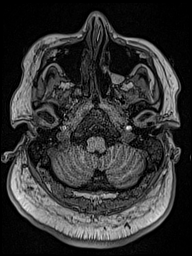
[im 43/170]
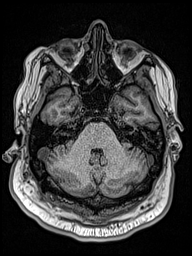
[im 57/170]
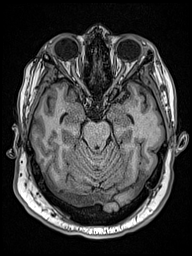
[im 71/170]
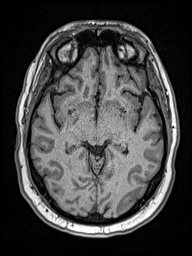
[im 85/170]
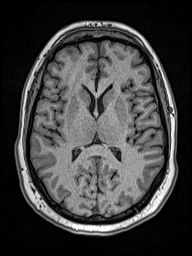
[im 99/170]
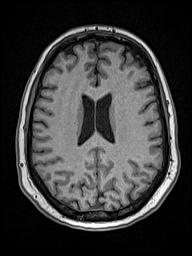
[im 113/170]
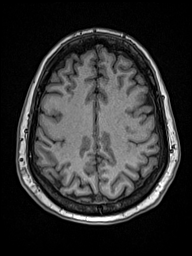
[im 127/170]
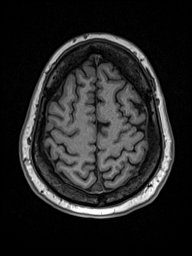
[im 141/170]
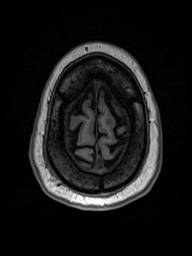
[im 155/170]
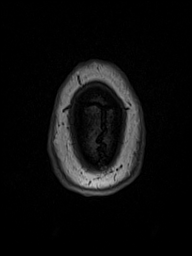
[im 170/170]
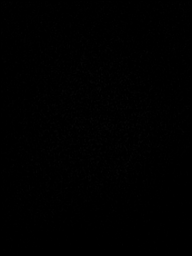

[Series 17: T2 · coronal · 5.0mm · 0.45mm/px · 2 of 33 slices shown (2 of 2)]
[im 1/33]
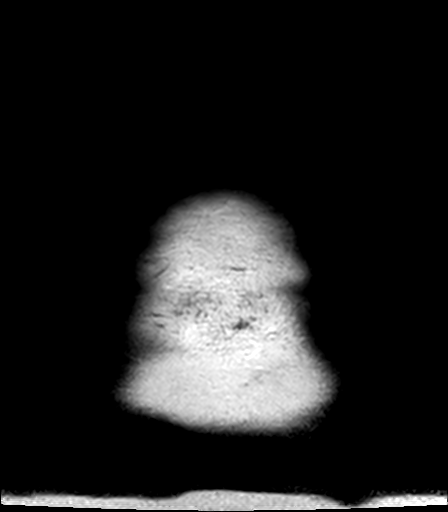
[im 33/33]
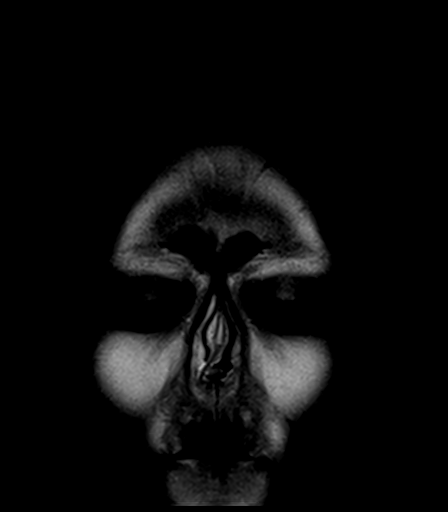

[48 of 48 positions shown; findings below may reference images not displayed]

FINDINGS: Brain: There is no acute infarction or intracranial hemorrhage.
There is no intracranial mass, mass effect, or edema. There is no
hydrocephalus or extra-axial fluid collection. Ventricles and sulci
are normal in size and configuration. Scattered foci of
susceptibility in the cerebral white matter most compatible with
chronic microhemorrhages.

Vascular: Major vessel flow voids at the skull base are preserved.

Skull and upper cervical spine: Normal marrow signal is preserved.

Sinuses/Orbits: Minor mucosal thickening.  Orbits are unremarkable.

Other: Sella is unremarkable. Bilateral patchy mastoid fluid
opacification.
IMPRESSION: No evidence of recent infarction or hemorrhage.

Moderate burden of chronic microhemorrhages, unexpected for age.

## 2020-05-31 IMAGING — MR MR CERVICAL SPINE W/O CM
5 series · 41 of 48 positions shown · non-contrast
Comparison: None.

CLINICAL DATA: Left arm weakness

EXAM:
MRI CERVICAL SPINE WITHOUT CONTRAST
TECHNIQUE: Multiplanar, multisequence MR imaging of the cervical spine was
performed. No intravenous contrast was administered.

[Series 5: T2 · sagittal · 3.0mm · 0.62mm/px · 6 of 15 slices shown (1 of 2)]
[im 1/15]
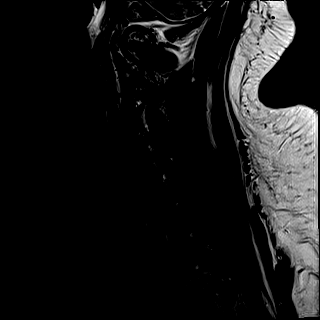
[im 3/15]
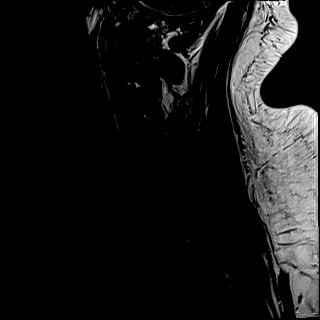
[im 6/15]
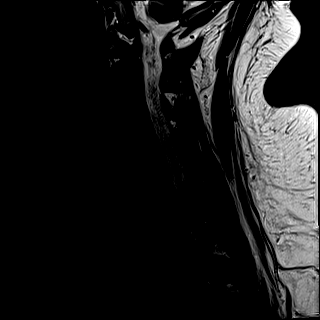
[im 9/15]
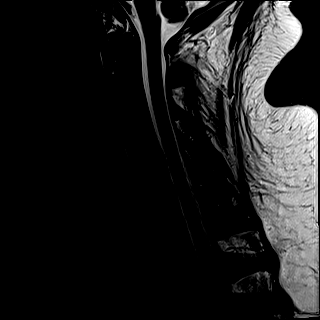
[im 12/15]
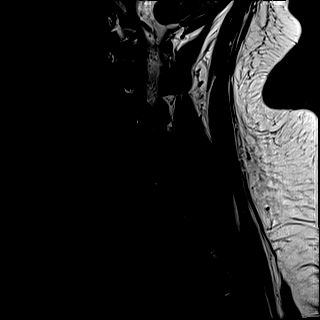
[im 15/15]
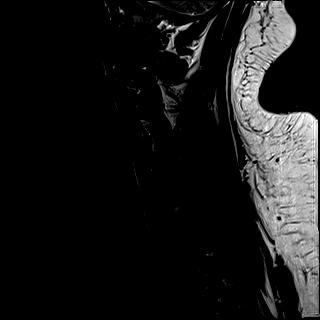

[Series 6: FLAIR · sagittal · 3.0mm · 0.78mm/px · 6 of 15 slices shown]
[im 1/15]
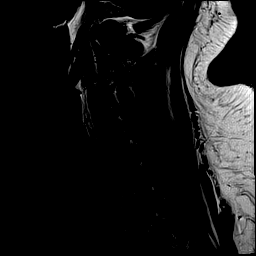
[im 3/15]
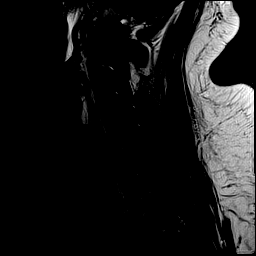
[im 6/15]
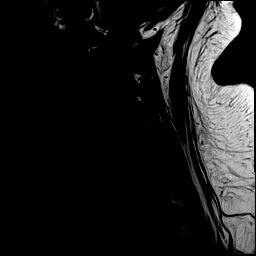
[im 9/15]
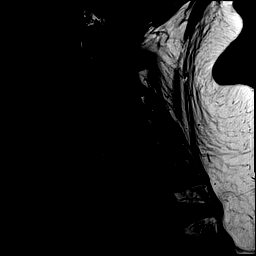
[im 12/15]
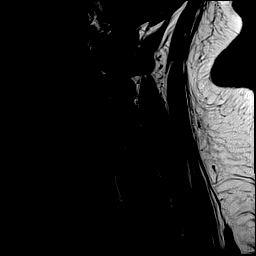
[im 15/15]
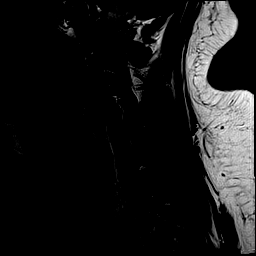

[Series 7: STIR · sagittal · 3.0mm · 0.62mm/px · 6 of 15 slices shown]
[im 1/15]
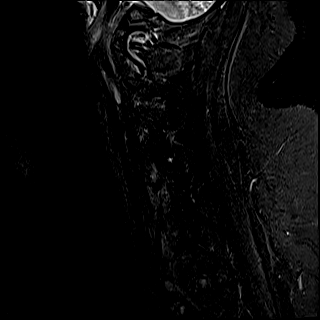
[im 3/15]
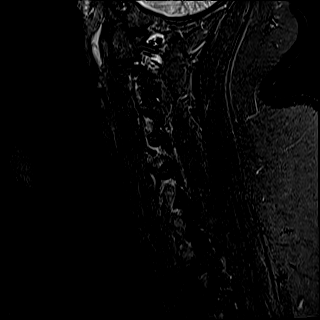
[im 6/15]
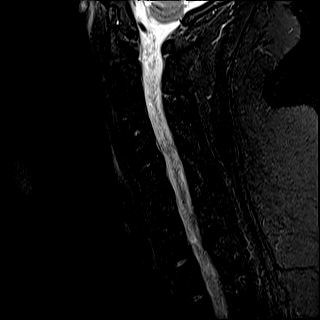
[im 9/15]
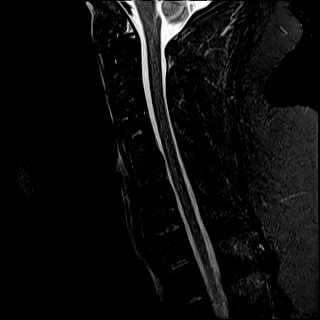
[im 12/15]
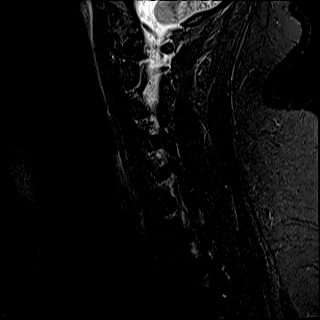
[im 15/15]
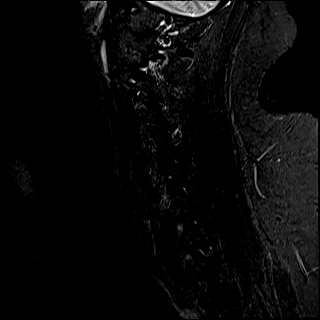

[Series 8: T2 · axial · 3.0mm · 0.70mm/px · z∈[-214,-95]mm · 15 of 36 slices shown (2 of 2)]
[im 1/36]
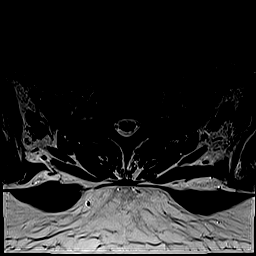
[im 3/36]
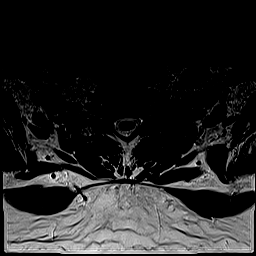
[im 6/36]
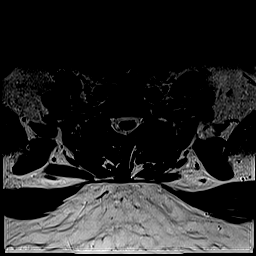
[im 8/36]
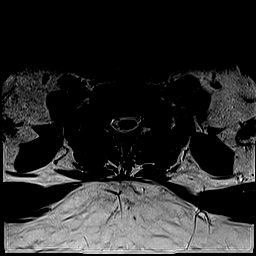
[im 11/36]
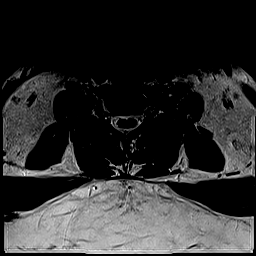
[im 13/36]
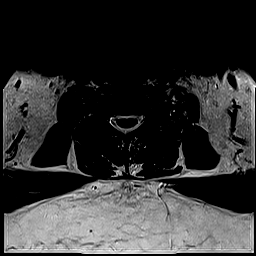
[im 16/36]
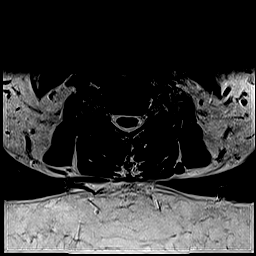
[im 18/36]
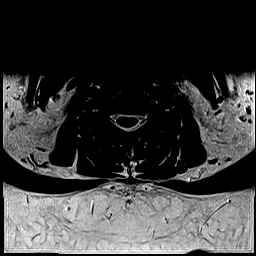
[im 21/36]
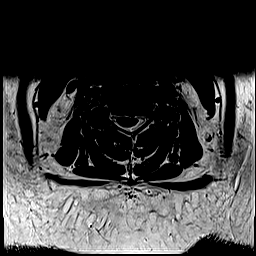
[im 23/36]
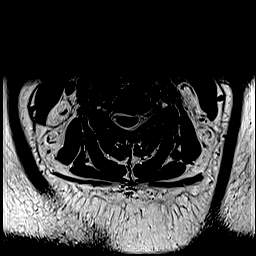
[im 26/36]
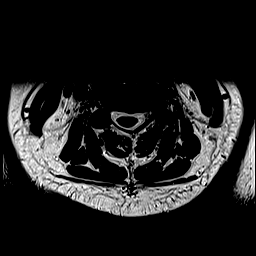
[im 28/36]
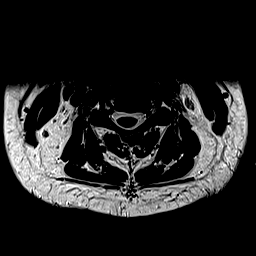
[im 31/36]
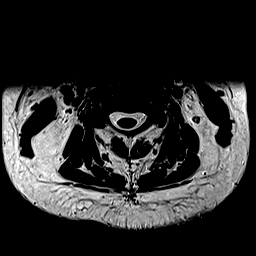
[im 33/36]
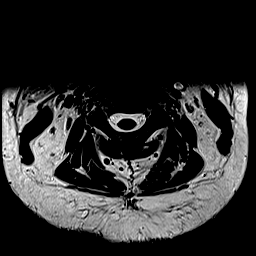
[im 36/36]
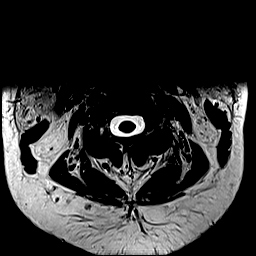

[Series 9: ax mpgr · axial · 3.0mm · 0.35mm/px · z∈[-214,-95]mm · 8 of 36 slices shown]
[im 1/36]
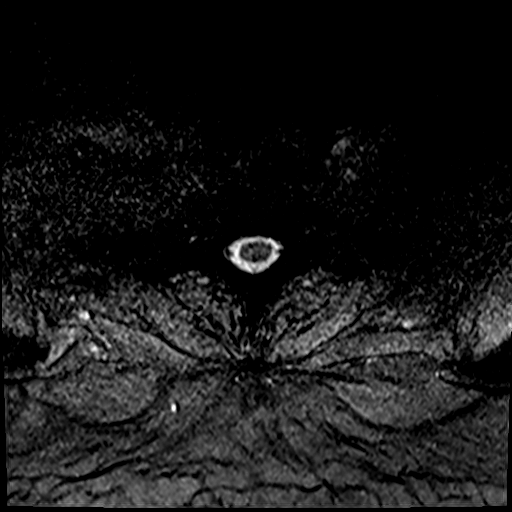
[im 6/36]
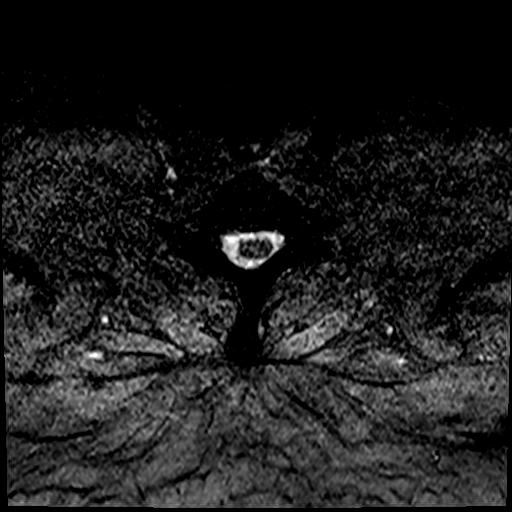
[im 11/36]
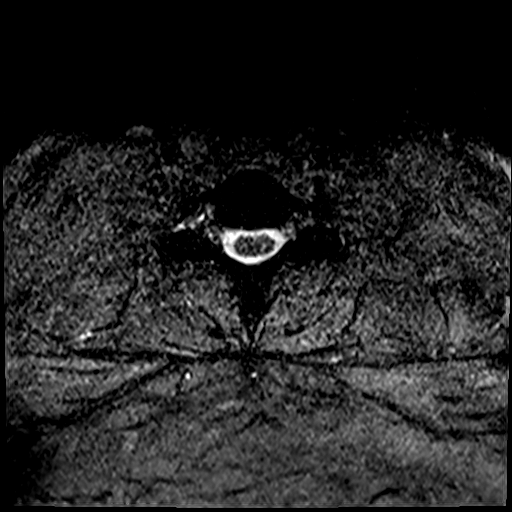
[im 16/36]
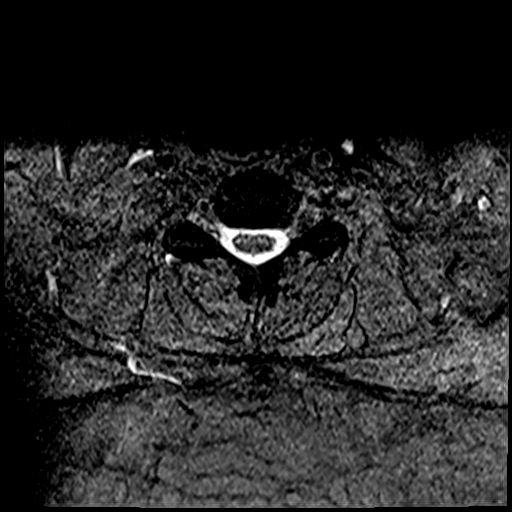
[im 21/36]
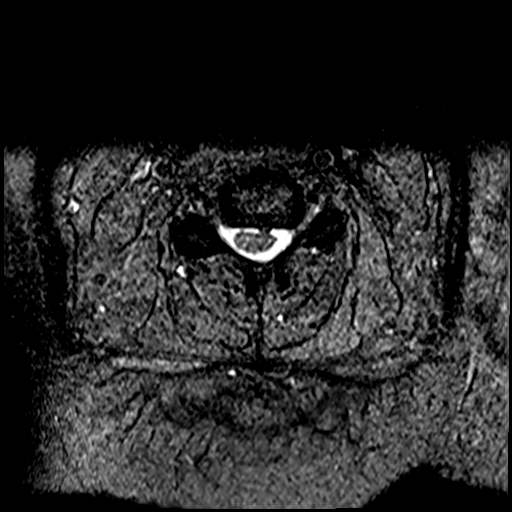
[im 26/36]
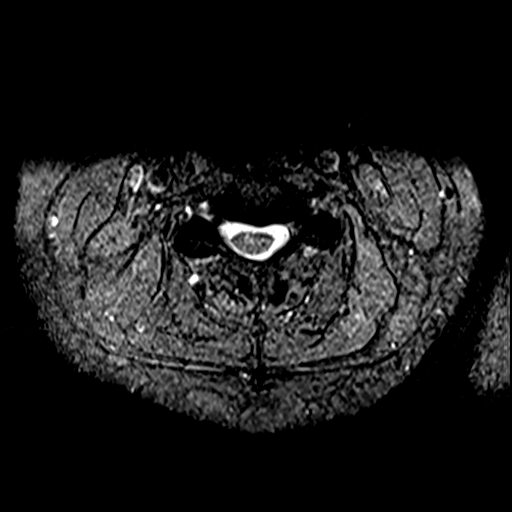
[im 31/36]
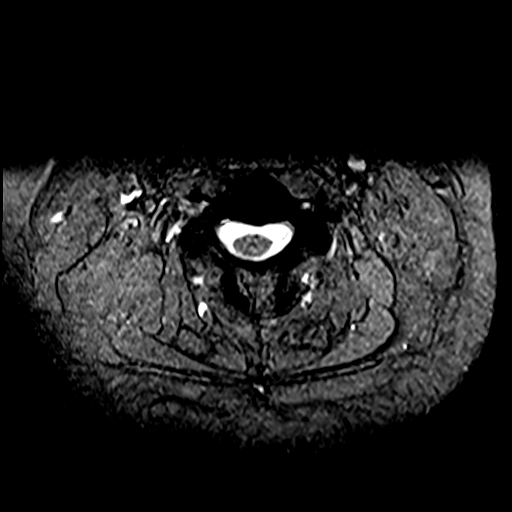
[im 36/36]
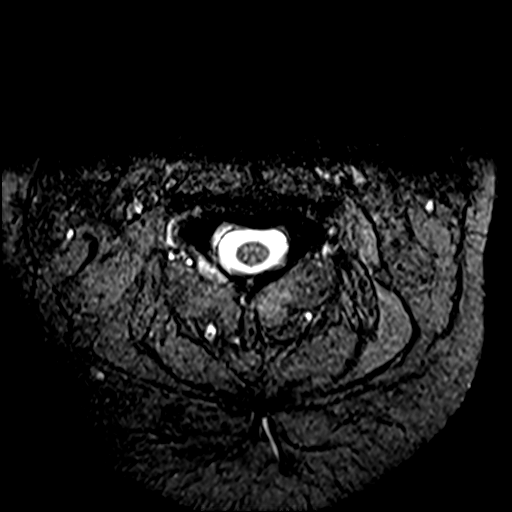

[41 of 48 positions shown; findings below may reference images not displayed]

FINDINGS: Alignment: Preserved.

Vertebrae: Vertebral body heights are maintained.

Cord: No abnormal signal.

Posterior Fossa, vertebral arteries, paraspinal tissues:
Unremarkable.

Disc levels: There is a minimal disc bulge with superimposed very
small left central disc protrusion at C4-C5 indenting the ventral
thecal sac. No canal or foraminal stenosis at any level.
IMPRESSION: No abnormal cord signal.  Minor degenerative disc disease at C4-C5.

## 2020-05-31 MED ORDER — MAGNESIUM CITRATE PO SOLN
1.0000 | Freq: Once | ORAL | Status: AC
  Administered 2020-05-31: 1 via ORAL
  Filled 2020-05-31: qty 296

## 2020-05-31 NOTE — TOC Progression Note (Signed)
Transition of Care [facility]) - Progression Note    Patient Details  Name: Collin Brown MRN: [id number] Date of Birth: 08/27/1985  Transition of Care [facility]) CM/SW Contact  Maree Krabbe, LCSW Phone Number: 05/31/2020, 10:48 AM  Clinical Narrative:  Patient has no bed offers at this time. Pt's mom really wants [facility] however, [facility] not take pt's who need B[facility] right now. CSW updated pt's mom yesterday evening. Bed search will be extended--pt's mom aware.     Expected Discharge Plan: Skilled Nursing Facility Barriers to Discharge: Continued Medical Work up  Expected Discharge Plan and Services Expected Discharge Plan: Skilled Nursing Facility In-house Referral: Clinical Social Work   Post Acute Care Choice: Skilled Nursing Facility Living arrangements for the past 2 months: Single Family Home                                       Social Determinants of Health (SDOH) Interventions    Readmission Risk Interventions No flowsheet data found.

## 2020-05-31 NOTE — Progress Notes (Signed)
Physical Therapy Treatment Patient Details Name: Collin Brown MRN: [id number] DOB: [birth year] Today's Date: 05/31/2020    History of Present Illness Collin Brown is a34yoM who comes to [facility] on 9/23 after progression of dyspnea. Pt recently (+)COVID19 along with other family. Pt required intubation on 9/25, self extubated on 10/10. At evaluation pt presenting on HHHF @55L /min, 90%FiO2.    PT Comments    Pt getting back from MRI upon arrival, agreeable to PT session. Pt educated on risk of LUE shoulder subluxation in the setting of limb paresis. Pt assisted with sling donning in supine, sling order obtained from attending this AM, planned for donning when OOB. Max+2A from supine to sitting EOB. Pt establishes seated balance quickly. Pt able to perform scap retraction, heel slides, SAQ, marching while seated EOB, intermittent minA needs, minA of trunk requires once after posterior LOB. Seated exercise on 7L HFNC, SpO2 ~92-92%, HR 120s-130s bpm, no CP, SOB, giddiness. 3 attempts to rise to standing, eventually able to rise fully with Max+2A, elevated surface, pt pushing RUE off chair arm. After 15+minutes at EOB, pt assisted back to supine for additional leg exercise. Pt shows steady progression in tolerance to sitting EOB, strength in BLE, Strength in BUE, and phonation amplitude. LUE remains largely flaccid with trace gripping, but sling use is successful in protecting limb this date. Pt continues to have pain in Left elbow with flexion >100 degrees, and constant 2/10 Left shoulder pain. Pt remains an excellent candidate for inpatient rehab.    Follow Up Recommendations  CIR     Equipment Recommendations  Other (comment)    Recommendations for Other Services Rehab consult     Precautions / Restrictions Precautions Precautions: Fall Precaution Comments: flaccid LUE (protect from subluxation) Required Braces or Orthoses: Sling (when seated or OOB)    Mobility  Bed Mobility Overal bed  mobility: Needs Assistance Bed Mobility: Supine to Sit     Supine to sit: Mod assist Sit to supine: Total assist;+2 for physical assistance;+2 for safety/equipment   General bed mobility comments: LUE in sling for bed mobility  Transfers Overall transfer level: Needs assistance Equipment used: 1 person hand held assist Transfers: Sit to/from Stand Sit to Stand: Max assist;+2 physical assistance;From elevated surface         General transfer comment: 3 attempts, finally able to fully rise; author must tibial block knees into TKE for stance; unable to maintain >2 seconds: HR incr >140bpm.  Ambulation/Gait                 Stairs             Wheelchair Mobility    Modified Rankin (Stroke Patients Only)       Balance Overall balance assessment: Needs assistance Sitting-balance support: Feet supported Sitting balance-Leahy Scale: Good     Standing balance support: Bilateral upper extremity supported;During functional activity Standing balance-Leahy Scale: Zero                              Cognition Arousal/Alertness: Awake/alert Behavior During Therapy: WFL for tasks assessed/performed Overall Cognitive Status: Within Functional Limits for tasks assessed                                        Exercises General Exercises - Lower Extremity Short Arc Quad: Both;15 reps;Seated;AROM;Limitations Short Arc Quad Limitations:  EOB Heel Slides: Both;Limitations;Seated;15 reps;AROM Heel Slides Limitations: EOB Hip ABduction/ADduction: Both;Supine;20 reps;AROM;Limitations Hip Abduction/Adduction Limitations: 2x10 bilat Hip Flexion/Marching: Supine;Both;AAROM;Seated;Limitations Hip Flexion/Marching Limitations: at EOB; has 1 LOB (posterior) Other Exercises Other Exercises: Seated EOB x 15 minutes, hands free Other Exercises: Hands free trunk lean and return to upright: 10x FWD Other Exercises: Seated EOB scapular retraction bilat  1x10 Other Exercises: Manually resisted Leg press 2x10  bilat    General Comments        Pertinent Vitals/Pain Pain Assessment: Faces Faces Pain Scale: Hurts whole lot Pain Location: Left lateral volar elbow with PROM>100* flexion Pain Descriptors / Indicators: Sharp Pain Intervention(s): Limited activity within patient's tolerance;Monitored during session;Repositioned    Home Living                      Prior Function            PT Goals (current goals can now be found in the care plan section) Acute Rehab PT Goals Patient Stated Goal: Pt wants to return home and back to work when ready PT Goal Formulation: With patient Time For Goal Achievement: 06/18/20 Potential to Achieve Goals: Fair Progress towards PT goals: Progressing toward goals    Frequency    Min 2X/week      PT Plan Current plan remains appropriate    Co-evaluation              AM-PAC PT "6 Clicks" Mobility   Outcome Measure  Help needed turning from your back to your side while in a flat bed without using bedrails?: Total Help needed moving from lying on your back to sitting on the side of a flat bed without using bedrails?: Total Help needed moving to and from a bed to a chair (including a wheelchair)?: Total Help needed standing up from a chair using your arms (e.g., wheelchair or bedside chair)?: Total Help needed to walk in hospital room?: Total Help needed climbing 3-5 steps with a railing? : Total 6 Click Score: 6    End of Session Equipment Utilized During Treatment: Oxygen Activity Tolerance: Patient tolerated treatment well;Patient limited by fatigue Patient left: in bed;with call bell/phone within reach;with nursing/sitter in room Nurse Communication: Mobility status (O2 needs improving; Sling orders in place) PT Visit Diagnosis: Difficulty in walking, not elsewhere classified (R26.2);Other abnormalities of gait and mobility (R26.89);Muscle weakness (generalized)  (M62.81)     Time: 7793-9030 PT Time Calculation (min) (ACUTE ONLY): 46 min  Charges:  $Therapeutic Exercise: 38-52 mins                     4:34 PM, 05/31/20 Rosamaria Lints, PT, DPT Physical Therapist - [facility]  [phone] (ASCOM)    Traver Meckes C 05/31/2020, 4:27 PM

## 2020-05-31 NOTE — Progress Notes (Signed)
OT Cancellation Note  Patient Details Name: Collin Brown MRN: [id number] DOB: 08-18-1985   Cancelled Treatment:    Reason Eval/Treat Not Completed: Patient at procedure or test/ unavailable. Pt out of room  Upon attempt, noted with orders for brain and cervical MRIs. Will re-attempt at later date/time as pt is available.  Richrd Prime, MPH, MS, OTR/L ascom [phone] 05/31/20, 2:56 PM

## 2020-05-31 NOTE — Progress Notes (Signed)
PROGRESS NOTE  Collin Brown QAS:[id number] DOB: 06/08/1986 DOA: 05/03/2020 PCP: Patient, No Pcp Per   LOS: 28 days   Brief narrative: As per HPI,  Collin Brown is a 34 y.o. male with medical history significant for obstructive sleep apnea, morbid obesity, presented to the hospital on May 03, 2020 with cough, shortness of breath, generalized weakness, fever and a positive Covid 19 test. He was admitted to the medical floor for acute hypoxemic respiratory failure secondary to COVID-19 pneumonia. He was treated with IV remdesivir and IV steroids. Baricitinib was not given because of leukopenia. His condition deteriorated and hypoxemia worsened. He was transferred to the ICU on May 05, 2020 where he was initially treated with BiPAP. However, he failed BiPAP therapy and so he was eventually intubated and placed on mechanical ventilation on May 05, 2020. He self extubated on May 20, 2020 was transitioned to BiPAP. Hospital course was complicated by acute metabolic encephalopathy and ARDS. His condition slowly improved and was transitioned to oxygenation via high flow nasal cannula. His level of care was downgraded from ICU status to stepdown status and the hospitalist team was consulted to continue with medical management.  Assessment/Plan:  Principal Problem:   Acute hypoxemic respiratory failure due to COVID-19 [facility]lin [facility]) Active Problems:   Sleep apnea   Pneumonia due to COVID-19 virus   Pressure injury of skin  COVID-19 pneumonia with ARDS: Completed treatment with IV steroids.  Post intubation subsequently extubated and on BiPAP at nighttime and high flow nasal cannula during the daytime.  It appears that patient might need prolonged support.  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  Has been noted since he was extubated.  No external swelling at the elbow.  Unsure this could be related to prolonged ICU stay with brachial  plexus injury/cervical radiculopathy.  He did have prolonged hypoxia.  Will need to rule out intracranial pathology as well.  Will get MRI of the brain/MRI of the cervical spine today.  Spoke with physical therapy yesterday and will be on a sling to prevent from hanging and trauma.  Acute hypoxemic respiratory failure with ARDS: Self extubated on 05/20/2020.  On BiPAP at nighttime.  On high flow nasal cannula during daytime.  Continue supportive care.  Off IV Lasix.  Type II DM: Hemoglobin A1c was 7.2.  Continue Levemir and Humalog sliding scale insulin.  Lipid panel noted with elevated total cholesterol LDL was 153.  On the account of diabetes he would benefit from a moderate intensity statin.  We will hold off at this time due to extreme muscle weakness.  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  Will cannula during the daytime.  Extreme Debility, deconditioning.  Continue PT OT.  At this time patient has been considered for rehabilitation.  Severe constipation.  Continue laxatives.  MiraLAX has been changed to twice a day and Senokot has been added  Pressure ulceration. Present on admission. Continue Wound care.   Pressure Injury 05/12/20 Toe (Comment  which one) Anterior;Right Stage 2 -  Partial thickness loss of dermis presenting as a shallow open injury with a red, pink wound bed without slough. (Active)  05/12/20 1930  Location: Toe (Comment  which one)  Location Orientation: Anterior;Right  Staging: Stage 2 -  Partial thickness loss of dermis presenting as a shallow open injury with a red, pink wound bed without slough.  Wound Description (Comments):   Present on  Admission: No     Pressure Injury 05/12/20 Toe (Comment  which one) Anterior;Left Stage 2 -  Partial thickness loss of dermis presenting as a shallow open injury with a red, pink wound bed without slough. (Active)  05/12/20 1930  Location: Toe (Comment  which  one)  Location Orientation: Anterior;Left  Staging: Stage 2 -  Partial thickness loss of dermis presenting as a shallow open injury with a red, pink wound bed without slough.  Wound Description (Comments):   Present on Admission: No     Pressure Injury 05/21/20 Buttocks Right;Left purple/white (Active)  05/21/20 2123  Location: Buttocks  Location Orientation: Right;Left  Staging:   Wound Description (Comments): purple/white  Present on Admission:     DVT prophylaxis: : Subcu Lovenox  Code Status: Full code  Family Communication: None today.  I spoke with the patient's mother and updated her about the clinical condition of the patient yesterday.   Status is: Inpatient  Remains inpatient appropriate because:IV treatments appropriate due to intensity of illness or inability to take PO, Inpatient level of care appropriate due to severity of illness and Awaiting for skilled nursing facility placement.  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   Dispo: The patient is from: Home              Anticipated d/c is to: SNF /CIR              Anticipated d/c date is: 2 to 3 days              Patient currently is not medically stable to d/c.  Consultants:  Critical care  Procedures:  Intubation mechanical ventilation on May 05, 2020  Self extubated on May 20, 2020  BiPAP placement  Antibiotics:  . None at this time  Subjective: Today, patient was seen and examined at bedside.  Complains of mild paresthesia and severe weakness of the left upper extremity.  Denies any chest pain or increasing dyspnea.  No fever or chills.  Complains of overall generalized weakness.   Objective: Vitals:   05/30/20 1919 05/31/20 0358  BP: (!) 165/97 (!) 164/100  Pulse: (!) 104 96  Resp: 19 18  Temp: 98.3 F (36.8 C) 98.9 F (37.2 C)  SpO2: 97% 100%    Intake/Output Summary (Last 24 hours) at 05/31/2020 0733 Last data filed at 05/31/2020 0402 Gross per 24 hour  Intake  --  Output 1200 ml  Net -1200 ml   Filed Weights   05/29/20 0417 05/30/20 0440 05/31/20 0402  Weight: (!) 162.7 kg (!) 161.3 kg (!) 161.9 kg   Body mass index is 51.21 kg/m.   Physical Exam:  GENERAL: Patient is alert awake and oriented. Not in obvious distress.  Morbidly obese.  On high flow nasal cannula. HENT: No scleral pallor or icterus. Pupils equally reactive to light. Oral mucosa is moist NECK: is supple, no gross swelling noted. CHEST: Clear to auscultation. No crackles or wheezes.  Diminished breath sounds bilaterally. CVS: S1 and S2 heard, no murmur. Regular rate and rhythm.  ABDOMEN: Soft, non-tender, obese abdomen bowel sounds are present. EXTREMITIES: No edema.  Left upper extremity flaccid paralysis with paresthesia. CNS: Cranial nerves are intact.  Left upper extremity flaccid paralysis.  Generalized weakness of the extremities. SKIN: warm and dry without rashes.  Data Review: I have personally reviewed the following laboratory data and studies,  CBC: Recent Labs  Lab 05/26/20 0543 05/26/20 0543 05/27/20 2725 05/28/20 0405 05/29/20 0507 05/30/20 0427 05/31/20  0603  WBC 7.6   < > 6.4 5.8 5.3 5.8 5.6  NEUTROABS 5.9  --  4.4 3.7 3.5 3.7  --   HGB 10.8*   < > 10.5* 10.9* 10.7* 11.4* 10.9*  HCT 34.7*   < > 33.8* 35.5* 34.7* 36.0* 33.7*  MCV 91.3   < > 91.8 91.7 91.3 89.3 89.4  PLT 253   < > 263 260 232 221 172   < > = values in this interval not displayed.   Basic Metabolic Panel: Recent Labs  Lab 05/25/20 0345 05/25/20 0345 05/26/20 0543 05/27/20 1324 05/28/20 0405 05/29/20 0507 05/30/20 0427 05/31/20 0603  NA 144  --  141 141 143  --   --  138  K 3.6  --  4.0 3.6 3.5  --   --  3.3*  CL 103  --  103 101 101  --   --  96*  CO2 28  --  29 31 32  --   --  34*  GLUCOSE 130*  --  154* 131* 122*  --   --  107*  BUN 26*  --  25* 28* 29*  --   --  17  CREATININE 0.54*  --  0.47* 0.60* 0.60*  --   --  0.41*  CALCIUM 8.6*  --  9.0 9.0 8.9  --   --  8.8*   MG 2.1   < > 2.2 2.2 2.3 2.3 2.1  --   PHOS 3.8   < > 4.6 4.8* 4.9* 4.6 4.5  --    < > = values in this interval not displayed.   Liver Function Tests: Recent Labs  Lab 05/31/20 0603  AST 23  ALT 72*  ALKPHOS 63  BILITOT 0.7  PROT 6.0*  ALBUMIN 3.1*   No results for input(s): LIPASE, AMYLASE in the last 168 hours. No results for input(s): AMMONIA in the last 168 hours. Cardiac Enzymes: No results for input(s): CKTOTAL, CKMB, CKMBINDEX, TROPONINI in the last 168 hours. BNP (last 3 results) Recent Labs    05/03/20 1633 05/16/20 1435  BNP 31.1 77.7    ProBNP (last 3 results) No results for input(s): PROBNP in the last 8760 hours.  CBG: Recent Labs  Lab 05/30/20 1128 05/30/20 1620 05/30/20 2013 05/30/20 2310 05/31/20 0401  GLUCAP 238* 160* 147* 121* 114*   No results found for this or any previous visit (from the past 240 hour(s)).   Studies: No results found.    Joycelyn Das, MD  [facility] 05/31/2020

## 2020-05-31 NOTE — Progress Notes (Signed)
Inpatient Rehab Admissions Coordinator:   Continuing to follow from a distance for CIR. Note pt improving with therapy and able to tolerate therapy at EOB on 10/20.  Will discuss with rehab MD whether pt may be approaching readiness for possible CIR consideration and continue to follow.   Estill Dooms, PT, DPT Admissions Coordinator [phone] 05/31/20  12:10 PM

## 2020-06-01 DIAGNOSIS — J9601 Acute respiratory failure with hypoxia: Secondary | ICD-10-CM | POA: Diagnosis not present

## 2020-06-01 DIAGNOSIS — K5900 Constipation, unspecified: Secondary | ICD-10-CM | POA: Diagnosis not present

## 2020-06-01 DIAGNOSIS — U071 COVID-19: Secondary | ICD-10-CM | POA: Diagnosis not present

## 2020-06-01 DIAGNOSIS — G4733 Obstructive sleep apnea (adult) (pediatric): Secondary | ICD-10-CM | POA: Diagnosis not present

## 2020-06-01 DIAGNOSIS — G540 Brachial plexus disorders: Secondary | ICD-10-CM | POA: Diagnosis not present

## 2020-06-01 LAB — GLUCOSE, CAPILLARY
Glucose-Capillary: 109 mg/dL — ABNORMAL HIGH (ref 70–99)
Glucose-Capillary: 110 mg/dL — ABNORMAL HIGH (ref 70–99)
Glucose-Capillary: 118 mg/dL — ABNORMAL HIGH (ref 70–99)
Glucose-Capillary: 136 mg/dL — ABNORMAL HIGH (ref 70–99)
Glucose-Capillary: 150 mg/dL — ABNORMAL HIGH (ref 70–99)
Glucose-Capillary: 193 mg/dL — ABNORMAL HIGH (ref 70–99)

## 2020-06-01 MED ORDER — POTASSIUM CHLORIDE CRYS ER 20 MEQ PO TBCR
40.0000 meq | EXTENDED_RELEASE_TABLET | Freq: Once | ORAL | Status: AC
  Administered 2020-06-01: 40 meq via ORAL
  Filled 2020-06-01: qty 2

## 2020-06-01 MED ORDER — INSULIN ASPART 100 UNIT/ML ~~LOC~~ SOLN
0.0000 [IU] | Freq: Three times a day (TID) | SUBCUTANEOUS | Status: DC
  Administered 2020-06-01: 4 [IU] via SUBCUTANEOUS
  Administered 2020-06-02: 3 [IU] via SUBCUTANEOUS
  Administered 2020-06-02: 7 [IU] via SUBCUTANEOUS
  Administered 2020-06-03: 4 [IU] via SUBCUTANEOUS
  Administered 2020-06-03: 3 [IU] via SUBCUTANEOUS
  Administered 2020-06-04 (×2): 11 [IU] via SUBCUTANEOUS
  Administered 2020-06-04: 7 [IU] via SUBCUTANEOUS
  Administered 2020-06-05: 11 [IU] via SUBCUTANEOUS
  Administered 2020-06-05: 7 [IU] via SUBCUTANEOUS
  Administered 2020-06-05: 4 [IU] via SUBCUTANEOUS
  Administered 2020-06-06 (×2): 3 [IU] via SUBCUTANEOUS
  Administered 2020-06-06: 11 [IU] via SUBCUTANEOUS
  Administered 2020-06-07: 7 [IU] via SUBCUTANEOUS
  Administered 2020-06-07: 11 [IU] via SUBCUTANEOUS
  Filled 2020-06-01 (×15): qty 1

## 2020-06-01 MED ORDER — INSULIN ASPART 100 UNIT/ML ~~LOC~~ SOLN
0.0000 [IU] | Freq: Every day | SUBCUTANEOUS | Status: DC
  Administered 2020-06-02: 2 [IU] via SUBCUTANEOUS
  Administered 2020-06-04: 4 [IU] via SUBCUTANEOUS
  Administered 2020-06-05 – 2020-06-07 (×3): 3 [IU] via SUBCUTANEOUS
  Filled 2020-06-01 (×5): qty 1

## 2020-06-01 NOTE — Consult Note (Signed)
NEURO HOSPITALIST CONSULT NOTE   Requestig physician: Dr. Tyson Babinski  Reason for Consult: LUE flaccid weakness  History obtained from:  Patient and Chart    HPI:                                                                                                                                          Collin Brown is an 34 y.o. male with a PMHx of super-morbid obesity, sleep apnea and right ear hearing loss, who presented on 9/23 with acute hypoxemic respiratory failure secondary to SARS/Covid PNA. He was treated with IV remdesivir and IV steroids, but Baricitinib was not given due to leukopenia. He was transferred to the ICU on 9/25 after his condition deteriorated with worsening hypoxemia. He failed BIPAP, requiring intubation. He self extubated on 10/10 and was started back on BIPAP. He has had a metabolic encephalopathy this stay.   His respiratory function has slowly improved and he is not on O2 via high-flow [place]. On 10/15 he was awake and alert, able to respond but still with encephalopathy and slow to communicate. On 10/16; mentation continued to slowly improve.   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 Hospitalist team is unsure if this could be related to prolonged ICU stay with brachial plexus injury/cervical radiculopathy. Physical therapy started a LUE sling to prevent from hanging and trauma. Extreme debility in the setting of deconditioning may be a contributing factor. MRI of the brain showed "moderate burden" of chronic microhemorrhages, but on review of MRI by Neurology, they are felt to be best rated as minimal. C-spine MRI showed minor degenerative change at C4-C5 but no abnormal cord signal. 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 Given that there was no explanation for the weakness on the MRI scans, Neurology was consulted for  further evaluation.    Past Medical History:  Diagnosis Date  . Hearing loss in right ear   . Sleep apnea     Past Surgical History:  Procedure Laterality Date  . TONSILLECTOMY      Family History  Problem Relation Age of Onset  . Diabetes Mellitus II Mother   . Hypertension Mother   . Diabetes Mellitus II Father   . High blood pressure Father               Social History:  reports that he has never smoked. He has never used smokeless tobacco. He reports that he does not drink alcohol and does not use drugs.  No Known Allergies  MEDICATIONS:  Prior to Admission:  Medications Prior to Admission  Medication Sig Dispense Refill Last Dose  . amoxicillin-clavulanate (AUGMENTIN) 875-125 MG tablet Take 1 tablet by mouth 2 (two) times daily. (Patient not taking: Reported on 05/03/2020) 20 tablet 0 Completed Course at Unknown time   Scheduled: . vitamin C  500 mg Oral Daily  . aspirin  81 mg Oral Daily  . chlorhexidine  15 mL Mouth Rinse BID  . Chlorhexidine Gluconate Cloth  6 each Topical Q0600  . enoxaparin (LOVENOX) injection  0.5 mg/kg Subcutaneous Q24H  . feeding supplement (NEPRO CARB STEADY)  237 mL Oral TID BM  . insulin aspart  0-20 Units Subcutaneous Q4H  . insulin detemir  20 Units Subcutaneous BID  . mouth rinse  15 mL Mouth Rinse q12n4p  . multivitamin with minerals  1 tablet Oral Daily  . polyethylene glycol  17 g Oral BID  . potassium chloride  40 mEq Oral Once  . senna  1 tablet Oral BID   Continuous: . sodium chloride Stopped (05/22/20 0823)     ROS:                                                                                                                                       Has some difficulty breathing. Left elbow pain with movement. Left shoulder pain has resolved. Other symptoms as per HPI with comprehensive ROS otherwise negative.      Blood pressure (!) 150/98, pulse 96, temperature (!) 97.4 F (36.3 C), temperature source Oral, resp. rate 18, height 5\' 10"  (1.778 m), weight (!) 161.9 kg, SpO2 99 %.   General Examination:                                                                                                       Physical Exam  General: Super-morbid obesity.  HEENT-  [place]/AT   Lungs- Mild wheezing noted.  Extremities- Some dressings to legs noted bilaterally . No cyanosis or pallor.   Neurological Examination Mental Status: Awake and alert, oriented x 5. Appears fatigued. Somewhat flattened affect. Speech fluent with intact naming and comprehension.  Cranial Nerves: II: Visual fields intact.   III,IV, VI: No ptosis. EOMI. No nystagmus. V,VII: Smile symmetric, facial temp sensation equal bilaterally VIII: Hearing intact to voice IX,X: Palate rises normally XI: Head is midline.  XII: Midline tongue extension Motor: RUE 4/5 proximally and distally LUE:  Shoulder with 0/5 strength in extension, abduction and flexion, but with 2/5 strength in  adduction.  Biceps 0/5 Triceps 1-2/5 Wrist flexion and extension 1-2/5 Finger flexion 2/5 Finger extension 1/5 RLE 4/5 proximally and distally LLE 4/5 proximally and distally Sensory: Temp and FT sensation intact to RUE, RLE and LLE LUE: Temp and FT sensation intact to upper arm and shoulder throughout. Severely decreased temp and FT sensation to entire forearm except for a narrow strip extending along the dorsal aspect from the elbow to the wrist. Left hand with severely decreased temp and FT sensation along dorsum, palmar aspect and all fingers dorsally and ventrally.  Deep Tendon Reflexes: 2+ right brachioradialis, trace right triceps (adiposity) 0 left brachioradialis and triceps 3+ bilateral patellae 1+ bilateral achilles Plantars: Mute bilaterally  Cerebellar: No ataxia with FNF on the right. Unable to perform on the left.  Gait: Unable to assess   Lab  Results: Basic Metabolic Panel: Recent Labs  Lab 05/26/20 0543 05/26/20 0543 05/27/20 0452 05/28/20 0405 05/29/20 0507 05/30/20 0427 05/31/20 0603  NA 141  --  141 143  --   --  138  K 4.0  --  3.6 3.5  --   --  3.3*  CL 103  --  101 101  --   --  96*  CO2 29  --  31 32  --   --  34*  GLUCOSE 154*  --  131* 122*  --   --  107*  BUN 25*  --  28* 29*  --   --  17  CREATININE 0.47*  --  0.60* 0.60*  --   --  0.41*  CALCIUM 9.0   < > 9.0 8.9  --   --  8.8*  MG 2.2  --  2.2 2.3 2.3 2.1  --   PHOS 4.6  --  4.8* 4.9* 4.6 4.5  --    < > = values in this interval not displayed.    CBC: Recent Labs  Lab 05/26/20 0543 05/26/20 0543 05/27/20 0452 05/28/20 0405 05/29/20 0507 05/30/20 0427 05/31/20 0603  WBC 7.6   < > 6.4 5.8 5.3 5.8 5.6  NEUTROABS 5.9  --  4.4 3.7 3.5 3.7  --   HGB 10.8*   < > 10.5* 10.9* 10.7* 11.4* 10.9*  HCT 34.7*   < > 33.8* 35.5* 34.7* 36.0* 33.7*  MCV 91.3   < > 91.8 91.7 91.3 89.3 89.4  PLT 253   < > 263 260 232 221 172   < > = values in this interval not displayed.    Cardiac Enzymes: No results for input(s): CKTOTAL, CKMB, CKMBINDEX, TROPONINI in the last 168 hours.  Lipid Panel: Recent Labs  Lab 05/29/20 0507  CHOL 216*  TRIG 194*  HDL 24*  CHOLHDL 9.0  VLDL 39  LDLCALC 355*    Imaging: MR BRAIN WO CONTRAST  Result Date: 05/31/2020 CLINICAL DATA:  Left arm weakness EXAM: MRI HEAD WITHOUT CONTRAST TECHNIQUE: Multiplanar, multiecho pulse sequences of the brain and surrounding structures were obtained without intravenous contrast. COMPARISON:  None. FINDINGS: Brain: There is no acute infarction or intracranial hemorrhage. There is no intracranial mass, mass effect, or edema. There is no hydrocephalus or extra-axial fluid collection. Ventricles and sulci are normal in size and configuration. Scattered foci of susceptibility in the cerebral white matter most compatible with chronic microhemorrhages. Vascular: Major vessel flow voids at the skull  base are preserved. Skull and upper cervical spine: Normal marrow signal is preserved. Sinuses/Orbits: Minor mucosal thickening.  Orbits are unremarkable. Other: Sella is unremarkable.  Bilateral patchy mastoid fluid opacification. IMPRESSION: No evidence of recent infarction or hemorrhage. Moderate burden of chronic microhemorrhages, unexpected for age. Electronically Signed   By: Guadlupe SpanishPraneil  Patel M.D.   On: 05/31/2020 17:19   MR CERVICAL SPINE WO CONTRAST  Result Date: 05/31/2020 CLINICAL DATA:  Left arm weakness EXAM: MRI CERVICAL SPINE WITHOUT CONTRAST TECHNIQUE: Multiplanar, multisequence MR imaging of the cervical spine was performed. No intravenous contrast was administered. COMPARISON:  None. FINDINGS: Alignment: Preserved. Vertebrae: Vertebral body heights are maintained. Cord: No abnormal signal. Posterior Fossa, vertebral arteries, paraspinal tissues: Unremarkable. Disc levels: There is a minimal disc bulge with superimposed very small left central disc protrusion at C4-C5 indenting the ventral thecal sac. No canal or foraminal stenosis at any level. IMPRESSION: No abnormal cord signal.  Minor degenerative disc disease at C4-C5. Electronically Signed   By: Guadlupe SpanishPraneil  Patel M.D.   On: 05/31/2020 15:55    Assessment: 34 year old male recovering from SARS/Covid PNA, LUE with severe weakness and sensory loss since self extubation 1. Exam reveals weakness and sensory loss to LUE that best localizes to the brachial plexus. The findings are not consistent with an isolated median, ulnar or radial nerve lesion. Also not consistent with a polyradiculopathy. Imaging and EMG/NCS will be needed to further localize.  2. MRI brain and cervical spine reveal no stroke or myelopathy to explain his LUE weakness with sensory loss. Also without any nerve root impingement to explain his presentation, but brachial plexus downstream of the nerve roots has not been imaged.  3. Deconditioning +/- an ICU neuropathy likely  explains the diffuse weakness seen in his RUE and BLE. The LUE also likely has been affected with the above process but there would need to be an additional brachial plexus lesion to explain the almost complete monoplegia of the LUE.   Recommendations: 1. CT of left shoulder, left lower neck and left lung apex with and without contrast to assess for a possible mass lesion or inflammation in the region of the left brachial plexus.  2. If above CT is negative, obtain MRI of the left brachial plexus with and without contrast. May need an open MRI.  3. Outpatient EMG/NCS of left upper extremity and paraspinals of the neck.  4. PT and OT.  5. OOB to chair daily as soon as able. Patients with ICU neuropathy tend to recover more quickly when spending most of their daytime hours sitting in various positions rather than continuing to be bedbound.    Electronically signed: Dr. Caryl PinaEric Anamika Kueker 06/01/2020, 11:21 AM

## 2020-06-01 NOTE — Progress Notes (Signed)
Nutrition Follow-up  DOCUMENTATION CODES:   Morbid obesity  INTERVENTION:   Nepro Shake po TID, each supplement provides 425 kcal and 19 grams protein  MVI daily   Vitamin C 500mg  po daily   Bowel regimen per MD   NUTRITION DIAGNOSIS:   Inadequate oral intake related to inability to eat as evidenced by NPO status. - resolving   GOAL:   Patient will meet greater than or equal to 90% of their needs - progressing   MONITOR:   PO intake, Supplement acceptance, Labs, Weight trends, Skin, I & O's  ASSESSMENT:   34 year old male with PMHx of OSA admitted with COVID-19 PNA.   Pt documented to be eating anywhere from 25-100% of meals in hospital; pt is also drinking Nepro supplements. Pt's oral intake has declined over the past couple of days; per MD note, pt with severe constipation. Pt is on aggressive bowel regimen. Plan is for possible enema today. Recommend continue supplements and vitamins after discharge. Plan is for possible CIR. Per chart, pt is down 36lbs since admit but still appears to be ~46lbs over his UBW. RD unsure how accurate bed weights are.   Medications reviewed and include: vitamin C, aspirin, lovenox, insulin, MVI, miralax, senokot  Labs reviewed: K 3.3(L), creat 0.41(L) P 4.5 wnl, Mg 2.1 wnl Hgb 10.9(L), Hct 33.7(L)  Diet Order:   Diet Order            DIET DYS 3 Room service appropriate? Yes with Assist; Fluid consistency: Nectar Thick  Diet effective now                EDUCATION NEEDS:   No education needs have been identified at this time  Skin:  Skin Assessment:  (Stage II buttocks, MASD, PI toes)  Last BM:  10/22- type 6  Height:   Ht Readings from Last 1 Encounters:  05/12/20 5\' 10"  (1.778 m)   Weight:   Wt Readings from Last 1 Encounters:  06/01/20 (!) 161.9 kg   Ideal Body Weight:  75.5 kg  BMI:  Body mass index is 51.22 kg/m.  Estimated Nutritional Needs:   Kcal:  3200-3500kcal/day  Protein:  >160g/day  Fluid:   2.3-2.6L/day  MS, RD, LDN Please refer to [facility] for RD and/or RD on-call/weekend/after hours pager

## 2020-06-01 NOTE — Progress Notes (Signed)
Physical Therapy Treatment Patient Details Name: Collin Brown MRN: [id number] DOB: [birth year] Today's Date: 06/01/2020    History of Present Illness Collin Brown is a34yoM who comes to [facility] on 9/23 after progression of dyspnea. Pt recently (+)COVID19 along with other family. Pt required intubation on 9/25, self extubated on 10/10. At evaluation pt presenting on HHHF @55L /min, 90%FiO2.    PT Comments    Pt in room upon entry, voiced need to [facility], RN made aware, [facility] returns to room later. Pt agreeable to session, mother in room for session. ModA to EOB, author supports LUE and gives assist at periscapular Tspine, Mom  Holds pt's RUE to allow for pulling up to EOB. At [facility], mom educated on sling donning. Pt prepared for STS lift, feet in place, sling started, but ultimately not long enough for pt's body size- mission aborted. Pt seated at EOB now at 15 minutes, appears more fatigued, HR noted in 140s which is much higher than previous days. RN assists pt back to supine. Bed level exercises commenced. Pt on 5L O2 much of session, sats remaining over 90%, but for supien exercises, pt moved back to 7L during exertion to see if helps with keeping HR low. O2 entering at EOS.   Follow Up Recommendations  CIR     Equipment Recommendations  Other (comment)    Recommendations for Other Services Rehab consult     Precautions / Restrictions Precautions Precautions: Fall Precaution Comments: flaccid LUE (protect from subluxation) Required Braces or Orthoses: Sling (when OOB)    Mobility  Bed Mobility Overal bed mobility: Needs Assistance Bed Mobility: Supine to Sit;Sit to Supine     Supine to sit: Mod assist Sit to supine: Total assist;+2 for physical assistance;+2 for safety/equipment   General bed mobility comments: LUE in sling for bed mobility  Transfers                    Ambulation/Gait                 Stairs             Wheelchair Mobility     Modified Rankin (Stroke Patients Only)       Balance   Sitting-balance support: Feet supported Sitting balance-Leahy Scale: Good Sitting balance - Comments: more fatigued this date but tolerates EOB for 15 minutes                                    Cognition Arousal/Alertness: Awake/alert Behavior During Therapy: WFL for tasks assessed/performed Overall Cognitive Status: Within Functional Limits for tasks assessed                                        Exercises Total Joint Exercises Bridges: Strengthening;Both;10 reps;Supine;Limitations Bridges Limitations: 2x10 bilat (unable to clear buttocks, but demonstrates movement of trunk) General Exercises - Upper Extremity Elbow Flexion: PROM;Left;20 reps;Supine Elbow Extension: PROM;Left;20 reps;Supine General Exercises - Lower Extremity Long Arc Quad: AROM;Both;Seated;10 reps Straight Leg Raises: AAROM;Both;10 reps;Supine;Limitations Straight Leg Raises Limitations: 2x10 bilat (minA Rt; ModA Left) Hip Flexion/Marching: Supine;Both;Limitations;Strengthening Hip Flexion/Marching Limitations: isometric holds hip @ 90 degrees 5x3secH bilat Mini-Sqauts: Strengthening;Both;Supine;20 reps;Limitations Mini Squats Limitations: manually resisted leg press 2x10 bilat Shoulder Exercises Shoulder External Rotation: AROM;Strengthening;Right;15 reps;Bar weights/barbell Bar Weights/Barbell (Shoulder External Rotation): Other (comment) (banana) Other Exercises  Other Exercises: Seated EOB x 15 minutes, hands free Other Exercises: Rt chest press AA/ROM 1x15    General Comments        Pertinent Vitals/Pain Pain Assessment: Faces Faces Pain Scale: Hurts little more Pain Location: Left lateral volar elbow with PROM>100* flexion Pain Descriptors / Indicators: Sharp Pain Intervention(s): Limited activity within patient's tolerance;Monitored during session;Repositioned    Home Living                       Prior Function            PT Goals (current goals can now be found in the care plan section) Acute Rehab PT Goals Patient Stated Goal: Pt wants to return home and back to work when ready PT Goal Formulation: With patient Time For Goal Achievement: 06/18/20 Potential to Achieve Goals: Fair Progress towards PT goals: Progressing toward goals    Frequency    Min 2X/week      PT Plan Current plan remains appropriate    Co-evaluation              AM-PAC PT "6 Clicks" Mobility   Outcome Measure  Help needed turning from your back to your side while in a flat bed without using bedrails?: Total Help needed moving from lying on your back to sitting on the side of a flat bed without using bedrails?: Total Help needed moving to and from a bed to a chair (including a wheelchair)?: Total Help needed standing up from a chair using your arms (e.g., wheelchair or bedside chair)?: Total Help needed to walk in hospital room?: Total Help needed climbing 3-5 steps with a railing? : Total 6 Click Score: 6    End of Session Equipment Utilized During Treatment: Oxygen Activity Tolerance: Patient tolerated treatment well;Treatment limited secondary to medical complications (Comment) Patient left: in bed;with call bell/phone within reach;with nursing/sitter in room;with family/visitor present (HOB @ 50 degrees) Nurse Communication: Mobility status PT Visit Diagnosis: Difficulty in walking, not elsewhere classified (R26.2);Other abnormalities of gait and mobility (R26.89);Muscle weakness (generalized) (M62.81)     Time: 0109-3235 PT Time Calculation (min) (ACUTE ONLY): 51 min  Charges:  $Therapeutic Exercise: 38-52 mins                     4:35 PM, 06/01/20 Collin Brown, PT, DPT Physical Therapist - [facility]  [phone] (ASCOM)    Collin Brown C 06/01/2020, 4:30 PM

## 2020-06-01 NOTE — TOC Progression Note (Signed)
Transition of Care [facility]) - Progression Note    Patient Details  Name: Collin Brown MRN: [id number] Date of Birth: 1986-04-13  [facility]) CM/SW Contact  Maree Krabbe, LCSW Phone Number: 06/01/2020, 12:02 PM  Clinical Narrative:   Pt still has no bed offers at this time. PT is now recommending CIR.    Expected Discharge Plan: Skilled Nursing Facility Barriers to Discharge: Continued Medical Work up  Expected Discharge Plan and Services Expected Discharge Plan: Skilled Nursing Facility In-house Referral: Clinical Social Work   Post Acute Care Choice: Skilled Nursing Facility Living arrangements for the past 2 months: Single Family Home                                       Social Determinants of Health (SDOH) Interventions    Readmission Risk Interventions No flowsheet data found.

## 2020-06-01 NOTE — Progress Notes (Signed)
PROGRESS NOTE  Collin Brown MBT:[id number] DOB: 1986-06-29 DOA: 05/03/2020 PCP: Patient, No Pcp Per   LOS: 29 days   Brief narrative: As per HPI,  Collin Brown is a 34 y.o. male with medical history significant for obstructive sleep apnea, morbid obesity, presented to the hospital on May 03, 2020 with cough, shortness of breath, generalized weakness, fever and a positive Covid 19 test. He was admitted to the medical floor for acute hypoxemic respiratory failure secondary to COVID-19 pneumonia. He was treated with IV remdesivir and IV steroids. Baricitinib was not given because of leukopenia. His condition deteriorated and hypoxemia worsened. He was transferred to the ICU on May 05, 2020 where he was initially treated with BiPAP. However, he failed BiPAP therapy and so he was eventually intubated and placed on mechanical ventilation on May 05, 2020. He self extubated on May 20, 2020 was transitioned to BiPAP. Hospital course was complicated by acute metabolic encephalopathy and ARDS. His condition slowly improved and was transitioned to oxygenation via high flow nasal cannula.  He was then transferred to the floor for further management.  Assessment/Plan:  Principal Problem:   Acute hypoxemic respiratory failure due to COVID-19 [facility]) Active Problems:   Sleep apnea   Pneumonia due to COVID-19 virus   Pressure injury of skin  Acute hypoxic respiratory failure secondary to COVID-19 pneumonia with ARDS: Completed treatment with IV steroids.  Patient subsequently self extubated and on BiPAP at nighttime and high flow nasal cannula during the daytime. Continue to wean oxygen as able.  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  MRI of the brain showed chronic microhemorrhages, C-spine MRI showed minor degenerative change at C4-C5 but no abnormal cord signal. Will consult neurology for  flaccid monoplegia.  Type II DM: Hemoglobin A1c was 7.2.  Continue Levemir and Humalog sliding scale insulin.  Lipid panel noted with elevated total cholesterol LDL was 153.  We will start the patient on moderate intensity statin prior to discharge.   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  Nasal cannula during the daytime.  Continue to wean as able.  Extreme debility, deconditioning.  Continue PT, OT.  Physical therapy has recommended CIR.  Transition of care on board for placement  Severe constipation.  Continue senna, MiraLAX.  Received 1 dose of magnesium citrate yesterday. Still no bowel movement. Consider enema today.    Pressure ulceration. Present on admission. Continue Wound care.   Pressure Injury 05/12/20 Toe (Comment  which one) Anterior;Right Stage 2 -  Partial thickness loss of dermis presenting as a shallow open injury with a red, pink wound bed without slough. (Active)  05/12/20 1930  Location: Toe (Comment  which one)  Location Orientation: Anterior;Right  Staging: Stage 2 -  Partial thickness loss of dermis presenting as a shallow open injury with a red, pink wound bed without slough.  Wound Description (Comments):   Present on Admission: No     Pressure Injury 05/12/20 Toe (Comment  which one) Anterior;Left Stage 2 -  Partial thickness loss of dermis presenting as a shallow open injury with a red, pink wound bed without slough. (Active)  05/12/20 1930  Location: Toe (Comment  which one)  Location Orientation: Anterior;Left  Staging: Stage 2 -  Partial thickness loss of dermis presenting as a shallow open injury with a red, pink wound bed without slough.  Wound Description (Comments):  Present on Admission: No     Pressure Injury 05/21/20 Buttocks Right;Left purple/white (Active)  05/21/20 2123  Location: Buttocks  Location Orientation: Right;Left  Staging:   Wound Description (Comments):  purple/white  Present on Admission:     DVT prophylaxis: : Subcu Lovenox  Code Status: Full code  Family Communication: None today.  I spoke with the patient's mother and updated her about the clinical condition of the patient yesterday.    Status is: Inpatient  Remains inpatient appropriate because:IV treatments appropriate due to intensity of illness or inability to take PO, Inpatient level of care appropriate due to severity of illness and Awaiting for skilled nursing facility placement.  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    Dispo: The patient is from: Home              Anticipated d/c is to: SNF /CIR              Anticipated d/c date is: 1 to 2 days              Patient currently is not medically stable to d/c.  Consultants:  Critical care  Neurology  Procedures:  Intubation mechanical ventilation on May 05, 2020  Self extubated on May 20, 2020  BiPAP placement  Antibiotics:  . None at this time  Subjective: Today, patient still complains of constipation despite magnesium citrate and multiple laxatives regimen. Still has a weakness of the left upper extremity with minimal movement. No improvement in power.    Objective: Vitals:   05/31/20 2145 06/01/20 0410  BP: (!) 139/95 (!) 144/97  Pulse: (!) 120 94  Resp: 18 19  Temp: 98.6 F (37 C) 97.6 F (36.4 C)  SpO2: 95% 100%    Intake/Output Summary (Last 24 hours) at 06/01/2020 0751 Last data filed at 06/01/2020 0456 Gross per 24 hour  Intake 480 ml  Output 1975 ml  Net -1495 ml   Filed Weights   05/30/20 0440 05/31/20 0402 06/01/20 0447  Weight: (!) 161.3 kg (!) 161.9 kg (!) 161.9 kg   Body mass index is 51.22 kg/m.   Physical Exam: General: Morbidly obese, not in obvious distress, on nasal cannula oxygen HENT:   No scleral pallor or icterus noted. Oral mucosa is moist.  Chest:  Clear breath sounds.  Diminished breath sounds bilaterally. No crackles or wheezes.  CVS: S1  &S2 heard. No murmur.  Regular rate and rhythm. Abdomen: Soft, nontender, nondistended.  Obese abdomen, bowel sounds are heard.   Extremities: No cyanosis, clubbing or edema.  Peripheral pulses are palpable.  Left upper extremity flaccid weakness. Psych: Alert, awake and oriented, normal mood CNS:  No cranial nerve deficits.  Left upper extremity flaccid weakness with some paresthesia. Power 1/5. Generalized weakness of the extremities Skin: Warm and dry.  No rashes noted.   Data Review: I have personally reviewed the following laboratory data and studies,  CBC: Recent Labs  Lab 05/26/20 0543 05/26/20 0543 05/27/20 0452 05/28/20 0405 05/29/20 0507 05/30/20 0427 05/31/20 0603  WBC 7.6   < > 6.4 5.8 5.3 5.8 5.6  NEUTROABS 5.9  --  4.4 3.7 3.5 3.7  --   HGB 10.8*   < > 10.5* 10.9* 10.7* 11.4* 10.9*  HCT 34.7*   < > 33.8* 35.5* 34.7* 36.0* 33.7*  MCV 91.3   < > 91.8 91.7 91.3 89.3 89.4  PLT 253   < > 263 260 232 221 172   < > = values in  this interval not displayed.   Basic Metabolic Panel: Recent Labs  Lab 05/26/20 0543 05/27/20 0452 05/28/20 0405 05/29/20 0507 05/30/20 0427 05/31/20 0603  NA 141 141 143  --   --  138  K 4.0 3.6 3.5  --   --  3.3*  CL 103 101 101  --   --  96*  CO2 29 31 32  --   --  34*  GLUCOSE 154* 131* 122*  --   --  107*  BUN 25* 28* 29*  --   --  17  CREATININE 0.47* 0.60* 0.60*  --   --  0.41*  CALCIUM 9.0 9.0 8.9  --   --  8.8*  MG 2.2 2.2 2.3 2.3 2.1  --   PHOS 4.6 4.8* 4.9* 4.6 4.5  --    Liver Function Tests: Recent Labs  Lab 05/31/20 0603  AST 23  ALT 72*  ALKPHOS 63  BILITOT 0.7  PROT 6.0*  ALBUMIN 3.1*   No results for input(s): LIPASE, AMYLASE in the last 168 hours. No results for input(s): AMMONIA in the last 168 hours. Cardiac Enzymes: No results for input(s): CKTOTAL, CKMB, CKMBINDEX, TROPONINI in the last 168 hours. BNP (last 3 results) Recent Labs    05/03/20 1633 05/16/20 1435  BNP 31.1 77.7    ProBNP (last 3  results) No results for input(s): PROBNP in the last 8760 hours.  CBG: Recent Labs  Lab 05/31/20 1116 05/31/20 1635 05/31/20 2034 06/01/20 0043 06/01/20 0440  GLUCAP 214* 137* 166* 110* 109*   No results found for this or any previous visit (from the past 240 hour(s)).   Studies: MR BRAIN WO CONTRAST  Result Date: 05/31/2020 CLINICAL DATA:  Left arm weakness EXAM: MRI HEAD WITHOUT CONTRAST TECHNIQUE: Multiplanar, multiecho pulse sequences of the brain and surrounding structures were obtained without intravenous contrast. COMPARISON:  None. FINDINGS: Brain: There is no acute infarction or intracranial hemorrhage. There is no intracranial mass, mass effect, or edema. There is no hydrocephalus or extra-axial fluid collection. Ventricles and sulci are normal in size and configuration. Scattered foci of susceptibility in the cerebral white matter most compatible with chronic microhemorrhages. Vascular: Major vessel flow voids at the skull base are preserved. Skull and upper cervical spine: Normal marrow signal is preserved. Sinuses/Orbits: Minor mucosal thickening.  Orbits are unremarkable. Other: Sella is unremarkable. Bilateral patchy mastoid fluid opacification. IMPRESSION: No evidence of recent infarction or hemorrhage. Moderate burden of chronic microhemorrhages, unexpected for age. Electronically Signed   By: Guadlupe Spanish M.D.   On: 05/31/2020 17:19   MR CERVICAL SPINE WO CONTRAST  Result Date: 05/31/2020 CLINICAL DATA:  Left arm weakness EXAM: MRI CERVICAL SPINE WITHOUT CONTRAST TECHNIQUE: Multiplanar, multisequence MR imaging of the cervical spine was performed. No intravenous contrast was administered. COMPARISON:  None. FINDINGS: Alignment: Preserved. Vertebrae: Vertebral body heights are maintained. Cord: No abnormal signal. Posterior Fossa, vertebral arteries, paraspinal tissues: Unremarkable. Disc levels: There is a minimal disc bulge with superimposed very small left central disc  protrusion at C4-C5 indenting the ventral thecal sac. No canal or foraminal stenosis at any level. IMPRESSION: No abnormal cord signal.  Minor degenerative disc disease at C4-C5. Electronically Signed   By: Guadlupe Spanish M.D.   On: 05/31/2020 15:55      Joycelyn Das, MD  [facility] 06/01/2020

## 2020-06-01 NOTE — Progress Notes (Signed)
OT Cancellation Note  Patient Details Name: Collin Brown MRN: [id number] DOB: May 31, 1986   Cancelled Treatment:    Reason Eval/Treat Not Completed: Fatigue/lethargy limiting ability to participate. Pt having just finished with PT and reports fatigue. Pt declines OT intervention this session and requests therapist to return at another time. OT will follow up when able.  [name], MS, OTR/L , CBIS ascom [phone]  06/01/20, 3:13 PM   06/01/2020, 3:13 PM

## 2020-06-02 ENCOUNTER — Encounter: Payer: Self-pay | Admitting: Hospitalist

## 2020-06-02 ENCOUNTER — Inpatient Hospital Stay: Payer: BC Managed Care – PPO

## 2020-06-02 DIAGNOSIS — U071 COVID-19: Secondary | ICD-10-CM | POA: Diagnosis not present

## 2020-06-02 DIAGNOSIS — K5900 Constipation, unspecified: Secondary | ICD-10-CM | POA: Diagnosis not present

## 2020-06-02 DIAGNOSIS — J9601 Acute respiratory failure with hypoxia: Secondary | ICD-10-CM | POA: Diagnosis not present

## 2020-06-02 DIAGNOSIS — G4733 Obstructive sleep apnea (adult) (pediatric): Secondary | ICD-10-CM | POA: Diagnosis not present

## 2020-06-02 LAB — BASIC METABOLIC PANEL
Anion gap: 9 (ref 5–15)
BUN: 12 mg/dL (ref 6–20)
CO2: 32 mmol/L (ref 22–32)
Calcium: 8.9 mg/dL (ref 8.9–10.3)
Chloride: 96 mmol/L — ABNORMAL LOW (ref 98–111)
Creatinine, Ser: 0.46 mg/dL — ABNORMAL LOW (ref 0.61–1.24)
GFR, Estimated: >60 mL/min (ref >60)
Glucose, Bld: 117 mg/dL — ABNORMAL HIGH (ref 70–99)
Potassium: 3.7 mmol/L (ref 3.5–5.1)
Sodium: 137 mmol/L (ref 135–145)

## 2020-06-02 LAB — GLUCOSE, CAPILLARY
Glucose-Capillary: 109 mg/dL — ABNORMAL HIGH (ref 70–99)
Glucose-Capillary: 127 mg/dL — ABNORMAL HIGH (ref 70–99)
Glucose-Capillary: 127 mg/dL — ABNORMAL HIGH (ref 70–99)
Glucose-Capillary: 157 mg/dL — ABNORMAL HIGH (ref 70–99)
Glucose-Capillary: 213 mg/dL — ABNORMAL HIGH (ref 70–99)
Glucose-Capillary: 245 mg/dL — ABNORMAL HIGH (ref 70–99)

## 2020-06-02 IMAGING — CT CT CHEST W/ CM
2 of 3 series · 15 of 36 positions shown, 18 images · IV contrast (omnipaque)
Comparison: [DATE]

CLINICAL DATA: Shoulder pain, evaluate for brachial plexus mass.

EXAM:
CT CHEST WITH CONTRAST
TECHNIQUE: Multidetector CT imaging of the chest was performed during
intravenous contrast administration.
CONTRAST:  100mL OMNIPAQUE IOHEXOL 300 MG/ML  SOLN

[Series 3: axial st · axial · 0.91mm/px · z∈[-556,-176]mm · 12 of 224 slices shown, 15 images]
[im 17/224  mediastinal]
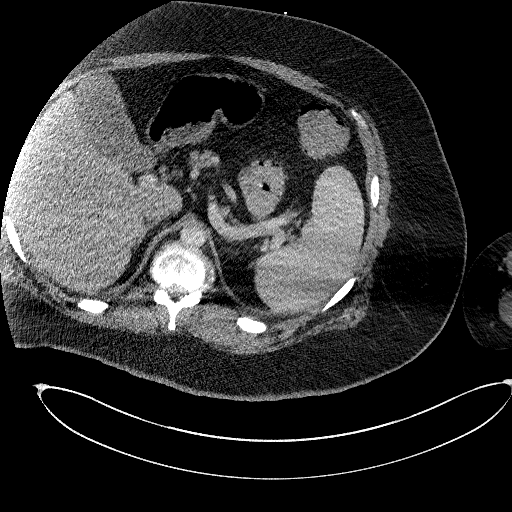
[im 17/224  lung]
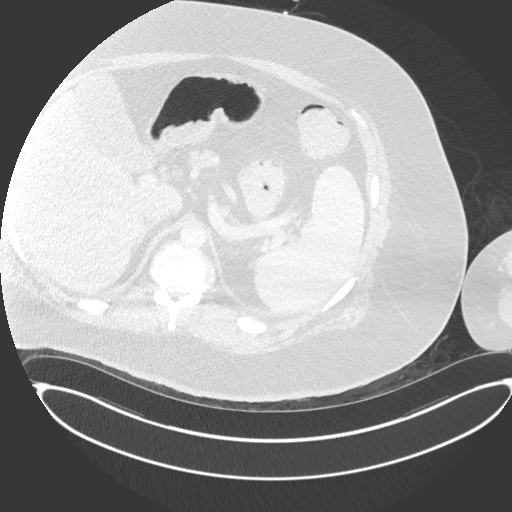
[im 34/224  lung]
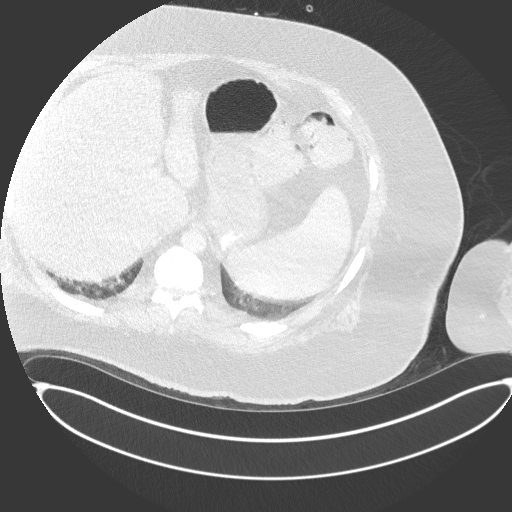
[im 50/224  lung]
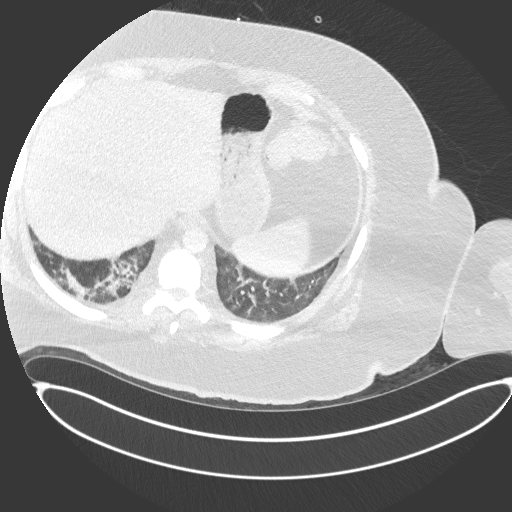
[im 67/224  lung]
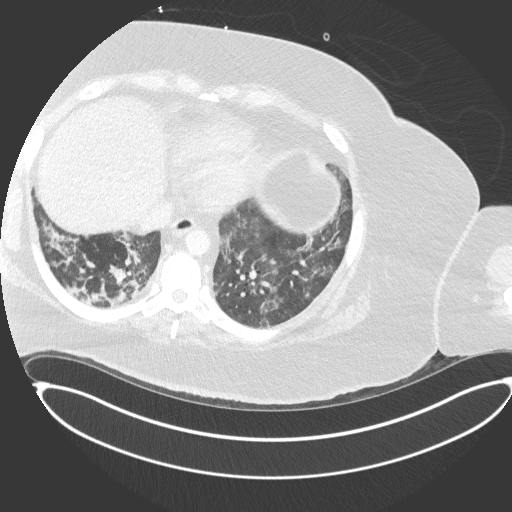
[im 83/224  mediastinal]
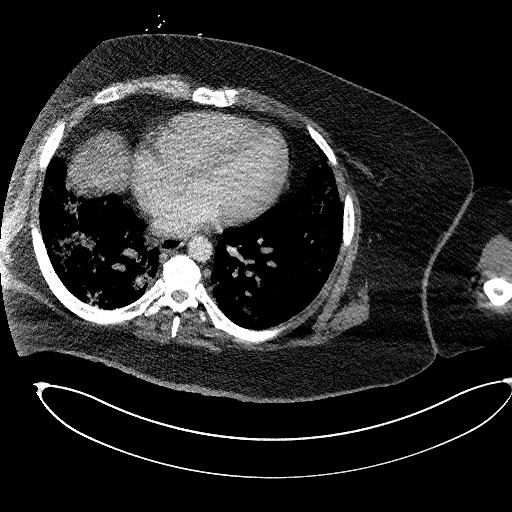
[im 83/224  lung]
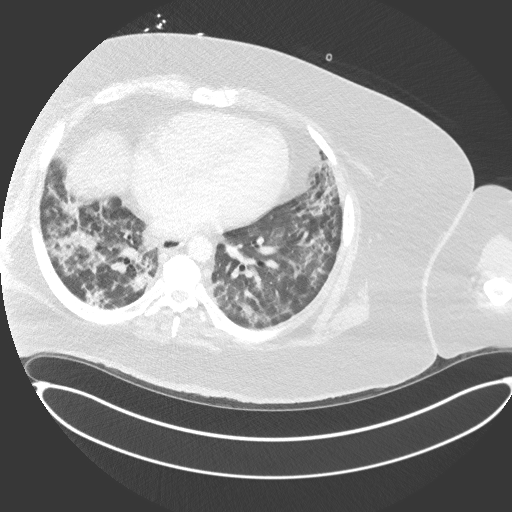
[im 100/224  lung]
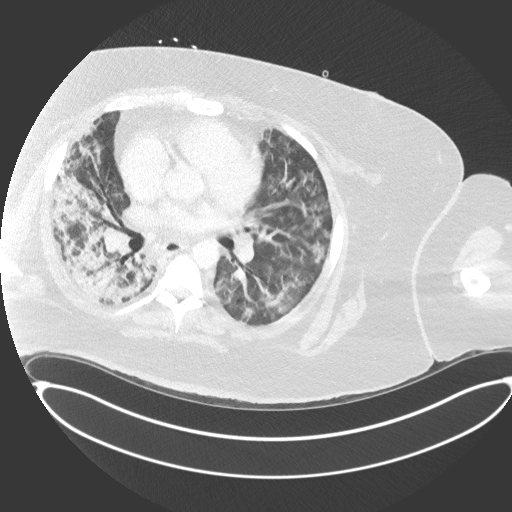
[im 124/224  lung]
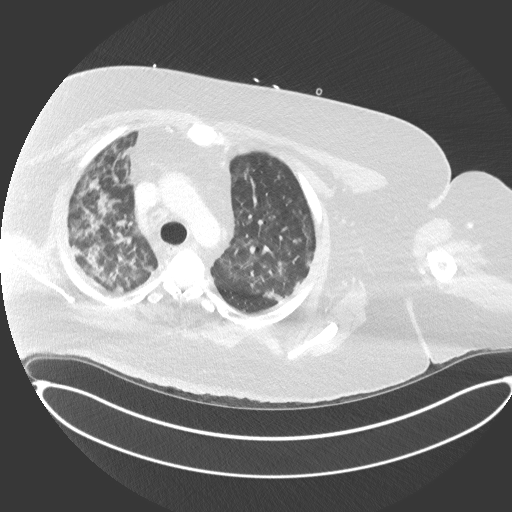
[im 141/224  lung]
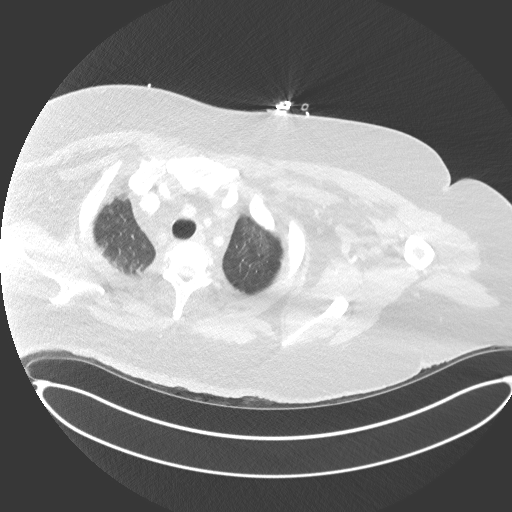
[im 157/224  mediastinal]
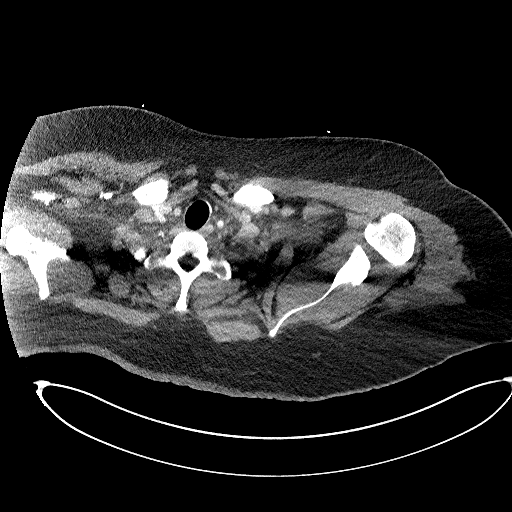
[im 157/224  lung]
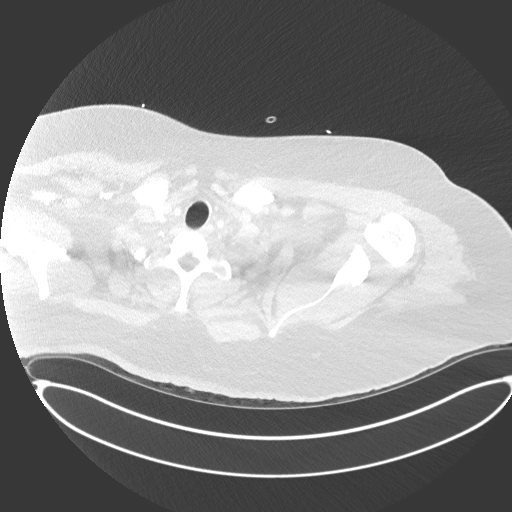
[im 174/224  lung]
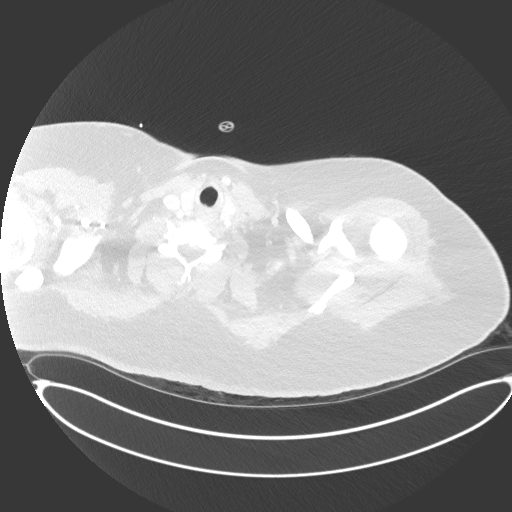
[im 190/224  lung]
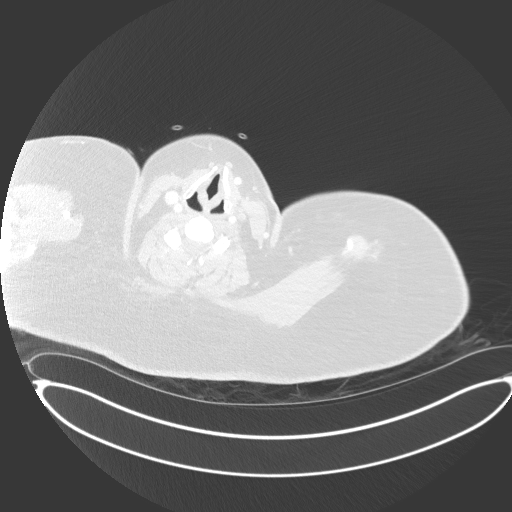
[im 207/224  lung]
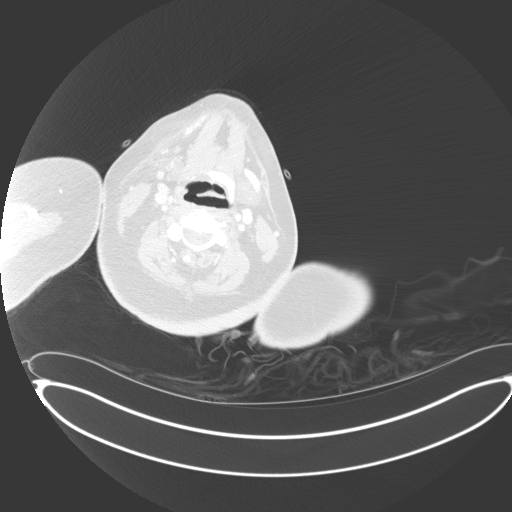

[Series 6: coronal · coronal · 0.90mm/px · 3 of 163 slices shown]
[im 33/163  lung]
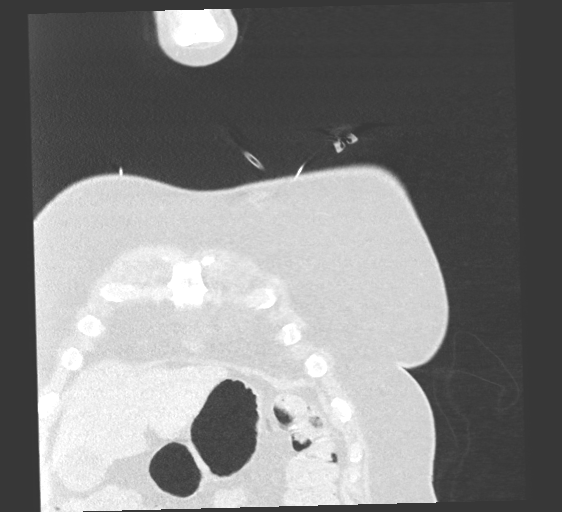
[im 65/163  lung]
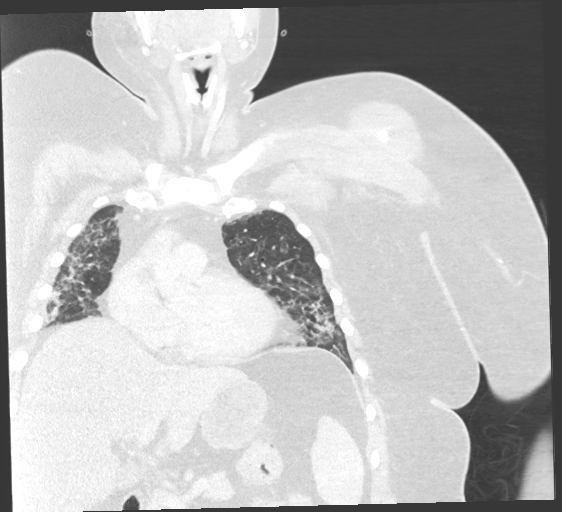
[im 98/163  lung]
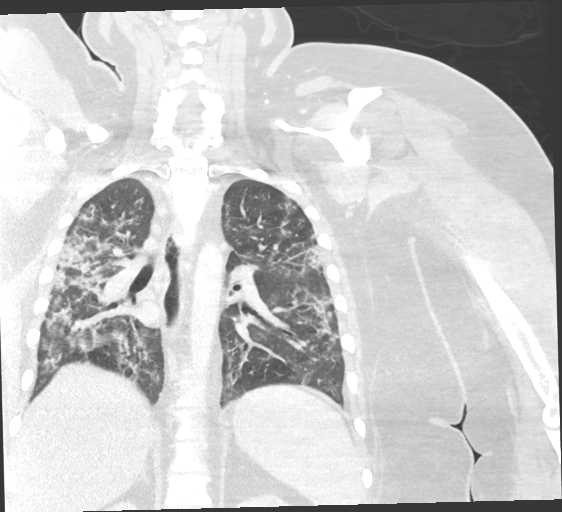

[15 of 36 positions shown; findings below may reference images not displayed]

FINDINGS: Cardiovascular: Normal heart size. No pericardial effusion. No acute
vascular finding.

Mediastinum/Nodes: Negative for adenopathy or mass. Resolved
pneumomediastinum.

Lungs/Pleura: Evolution of COVID associated pneumonia which has a
more streaky and reticular appearance than the ground-glass density
seen previously (fibrotic features). No edema, effusion, or
pneumothorax.

Upper Abdomen: Unremarkable

Musculoskeletal: Concerning the patient's left brachial plexus
symptoms, no mass or fluid collection is seen at the level of the
left brachial plexus. A cervical MRI was recently performed which
covered the roots, which were also unremarkable.
IMPRESSION: 1. No abnormality seen at the level of the left brachial plexus.
2. Evolving COVID pneumonia.

## 2020-06-02 MED ORDER — IOHEXOL 300 MG/ML  SOLN
100.0000 mL | Freq: Once | INTRAMUSCULAR | Status: AC | PRN
  Administered 2020-06-02: 100 mL via INTRAVENOUS

## 2020-06-02 NOTE — Progress Notes (Addendum)
PROGRESS NOTE  Collin Brown:[id number] DOB: 08/27/1985 DOA: 05/03/2020 PCP: Patient, No Pcp Per   LOS: 30 days   Brief narrative: As per HPI,  Collin Brown is a 34 y.o. male with medical history significant for obstructive sleep apnea, morbid obesity, presented to the hospital on May 03, 2020 with cough, shortness of breath, generalized weakness, fever and a positive Covid 19 test. He was admitted to the medical floor for acute hypoxemic respiratory failure secondary to COVID-19 pneumonia. He was treated with IV remdesivir and IV steroids. Baricitinib was not given because of leukopenia. His condition deteriorated and hypoxemia worsened. He was transferred to the ICU on May 05, 2020 where he was initially treated with BiPAP. However, he failed BiPAP therapy and so he was eventually intubated and placed on mechanical ventilation on May 05, 2020. He self extubated on May 20, 2020 was transitioned to BiPAP. Hospital course was complicated by acute metabolic encephalopathy and ARDS. His condition slowly improved and was transitioned to oxygenation via high flow nasal cannula.  He was then transferred to the floor for further management.  Patient continued to have left upper extremity weakness after transfer from the ICU.  Assessment/Plan:  Principal Problem:   Acute hypoxemic respiratory failure due to COVID-19 [facility]) Active Problems:   Sleep apnea   Pneumonia due to COVID-19 virus   Pressure injury of skin  Acute hypoxic respiratory failure secondary to COVID-19 pneumonia with ARDS: Completed treatment with IV steroids.  Patient subsequently self extubated and on BiPAP at nighttime and high flow nasal cannula during the daytime. Continue to wean oxygen as able.  Currently on 3 L of oxygen by nasal cannula.  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  Does have flaccid paralysis with paresthesia and numbness and some elbow pain since he was extubated.   MRI of the brain showed chronic microhemorrhages, C-spine MRI showed minor degenerative change at C4-C5 but no abnormal cord signal.  Consulted neurology and appreciated feedback.  High suspicion for brachial plexus neuropathy.  Will obtain a CT scan of the last shoulder to assess for venous as per neurology recommendation.  Patient might need the EMG as outpatient.  Right of mild ulnar neuropathy.  Continue supportive care.  Type II DM: Hemoglobin A1c was 7.2.  Continue Levemir and Humalog sliding scale insulin.  Lipid panel noted with elevated total cholesterol LDL was 153.  We will start the patient on moderate intensity statin prior to discharge.   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  Nasal cannula during the daytime.  Continue to wean as able.  Extreme debility, deconditioning.  Continue PT, OT.  Physical therapy  recommended CIR.  Transition of care on board for placement.  Severe constipation.  Continue senna, MiraLAX.  Received magnesium citrate recently and enema yesterday.  Patient did have a good bowel movement yesterday.  Continue senna and MiraLAX at this time. Pressure ulceration. Present on admission. Continue Wound care.   Pressure Injury 05/12/20 Toe (Comment  which one) Anterior;Right Stage 2 -  Partial thickness loss of dermis presenting as a shallow open injury with a red, pink wound bed without slough. (Active)  05/12/20 1930  Location: Toe (Comment  which one)  Location Orientation: Anterior;Right  Staging: Stage 2 -  Partial thickness loss of dermis presenting as a shallow open injury with a red, pink wound bed without slough.  Wound Description (Comments):   Present on Admission: No  Pressure Injury 05/12/20 Toe (Comment  which one) Anterior;Left Stage 2 -  Partial thickness loss of dermis presenting as a shallow open injury with a red, pink wound bed without slough. (Active)  05/12/20 1930   Location: Toe (Comment  which one)  Location Orientation: Anterior;Left  Staging: Stage 2 -  Partial thickness loss of dermis presenting as a shallow open injury with a red, pink wound bed without slough.  Wound Description (Comments):   Present on Admission: No     Pressure Injury 05/21/20 Buttocks Right;Left purple/white (Active)  05/21/20 2123  Location: Buttocks  Location Orientation: Right;Left  Staging:   Wound Description (Comments): purple/white  Present on Admission:     DVT prophylaxis: : Subcu Lovenox  Code Status: Full code  Family Communication: I tried to reach the patient's mother on the phone but was unable to reach her.   Status is: Inpatient  Remains inpatient appropriate because:IV treatments appropriate due to intensity of illness or inability to take PO, Inpatient level of care appropriate due to severity of illness and Awaiting for skilled nursing facility placement.  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    Dispo: The patient is from: Home              Anticipated d/c is to: SNF /CIR              Anticipated d/c date is: Undetermined at this time.              Patient currently is not medically stable to d/c.  Consultants:  Critical care  Neurology  Procedures:  Intubation mechanical ventilation on May 05, 2020  Self extubated on May 20, 2020  BiPAP placement  Antibiotics:  . None at this time  Subjective: Today, patient was seen and examined at bedside.  Complains of persistent left upper extremity weakness.  Mild numbness over the inner fingers on the right.  Denies increasing shortness of breath or dyspnea.  Has had a bowel movement after enema yesterday.    Objective: Vitals:   06/02/20 0359 06/02/20 0728  BP: (!) 140/92 (!) 140/100  Pulse: 98 (!) 102  Resp: 18 17  Temp: 97.8 F (36.6 C) 97.6 F (36.4 C)  SpO2: 100% 96%    Intake/Output Summary (Last 24 hours) at 06/02/2020 0757 Last data filed at  06/02/2020 0410 Gross per 24 hour  Intake --  Output 1925 ml  Net -1925 ml   Filed Weights   05/31/20 0402 06/01/20 0447 06/02/20 0402  Weight: (!) 161.9 kg (!) 161.9 kg (!) 164.8 kg   Body mass index is 52.13 kg/m.   Physical Exam:  General: Morbidly obese, not in obvious distress, on nasal cannula oxygen HENT:   No scleral pallor or icterus noted. Oral mucosa is moist.  Chest:  Clear breath sounds.  Diminished breath sounds bilaterally. No crackles or wheezes.  CVS: S1 &S2 heard. No murmur.  Regular rate and rhythm. Abdomen: Soft, nontender, nondistended.  Obese abdomen, bowel sounds are heard.   Extremities: No cyanosis, clubbing or edema.  Peripheral pulses are palpable.  Left upper extremity flaccid weakness+ with some paresthesia.  Right fourth and fifth finger area with numbness Psych: Alert, awake and oriented, normal mood CNS:  No cranial nerve deficits.  Left upper extremity flaccid weakness with some paresthesia. Power 1/5. Generalized weakness of the extremities Skin: Warm and dry.  No rashes noted.   Data Review: I have personally reviewed the following laboratory data and studies,  CBC: Recent Labs  Lab 05/27/20 0452 05/28/20 0405 05/29/20 0507 05/30/20 0427 05/31/20 0603  WBC 6.4 5.8 5.3 5.8 5.6  NEUTROABS 4.4 3.7 3.5 3.7  --   HGB 10.5* 10.9* 10.7* 11.4* 10.9*  HCT 33.8* 35.5* 34.7* 36.0* 33.7*  MCV 91.8 91.7 91.3 89.3 89.4  PLT 263 260 232 221 172   Basic Metabolic Panel: Recent Labs  Lab 05/27/20 0452 05/28/20 0405 05/29/20 0507 05/30/20 0427 05/31/20 0603 06/02/20 0415  NA 141 143  --   --  138 137  K 3.6 3.5  --   --  3.3* 3.7  CL 101 101  --   --  96* 96*  CO2 31 32  --   --  34* 32  GLUCOSE 131* 122*  --   --  107* 117*  BUN 28* 29*  --   --  17 12  CREATININE 0.60* 0.60*  --   --  0.41* 0.46*  CALCIUM 9.0 8.9  --   --  8.8* 8.9  MG 2.2 2.3 2.3 2.1  --   --   PHOS 4.8* 4.9* 4.6 4.5  --   --    Liver Function Tests: Recent Labs   Lab 05/31/20 0603  AST 23  ALT 72*  ALKPHOS 63  BILITOT 0.7  PROT 6.0*  ALBUMIN 3.1*   No results for input(s): LIPASE, AMYLASE in the last 168 hours. No results for input(s): AMMONIA in the last 168 hours. Cardiac Enzymes: No results for input(s): CKTOTAL, CKMB, CKMBINDEX, TROPONINI in the last 168 hours. BNP (last 3 results) Recent Labs    05/03/20 1633 05/16/20 1435  BNP 31.1 77.7    ProBNP (last 3 results) No results for input(s): PROBNP in the last 8760 hours.  CBG: Recent Labs  Lab 06/01/20 1632 06/01/20 2008 06/02/20 0035 06/02/20 0401 06/02/20 0727  GLUCAP 193* 150* 157* 127* 109*   No results found for this or any previous visit (from the past 240 hour(s)).   Studies: MR BRAIN WO CONTRAST  Result Date: 05/31/2020 CLINICAL DATA:  Left arm weakness EXAM: MRI HEAD WITHOUT CONTRAST TECHNIQUE: Multiplanar, multiecho pulse sequences of the brain and surrounding structures were obtained without intravenous contrast. COMPARISON:  None. FINDINGS: Brain: There is no acute infarction or intracranial hemorrhage. There is no intracranial mass, mass effect, or edema. There is no hydrocephalus or extra-axial fluid collection. Ventricles and sulci are normal in size and configuration. Scattered foci of susceptibility in the cerebral white matter most compatible with chronic microhemorrhages. Vascular: Major vessel flow voids at the skull base are preserved. Skull and upper cervical spine: Normal marrow signal is preserved. Sinuses/Orbits: Minor mucosal thickening.  Orbits are unremarkable. Other: Sella is unremarkable. Bilateral patchy mastoid fluid opacification. IMPRESSION: No evidence of recent infarction or hemorrhage. Moderate burden of chronic microhemorrhages, unexpected for age. Electronically Signed   By: Guadlupe Spanish M.D.   On: 05/31/2020 17:19   MR CERVICAL SPINE WO CONTRAST  Result Date: 05/31/2020 CLINICAL DATA:  Left arm weakness EXAM: MRI CERVICAL SPINE  WITHOUT CONTRAST TECHNIQUE: Multiplanar, multisequence MR imaging of the cervical spine was performed. No intravenous contrast was administered. COMPARISON:  None. FINDINGS: Alignment: Preserved. Vertebrae: Vertebral body heights are maintained. Cord: No abnormal signal. Posterior Fossa, vertebral arteries, paraspinal tissues: Unremarkable. Disc levels: There is a minimal disc bulge with superimposed very small left central disc protrusion at C4-C5 indenting the ventral thecal sac. No canal or foraminal stenosis at any level. IMPRESSION: No abnormal cord signal.  Minor degenerative disc disease at C4-C5. Electronically Signed   By: Guadlupe Spanish M.D.   On: 05/31/2020 15:55      Joycelyn Das, MD  [facility] 06/02/2020

## 2020-06-03 DIAGNOSIS — J9601 Acute respiratory failure with hypoxia: Secondary | ICD-10-CM | POA: Diagnosis not present

## 2020-06-03 DIAGNOSIS — U071 COVID-19: Secondary | ICD-10-CM | POA: Diagnosis not present

## 2020-06-03 DIAGNOSIS — G4733 Obstructive sleep apnea (adult) (pediatric): Secondary | ICD-10-CM | POA: Diagnosis not present

## 2020-06-03 DIAGNOSIS — M5412 Radiculopathy, cervical region: Secondary | ICD-10-CM | POA: Diagnosis not present

## 2020-06-03 DIAGNOSIS — K5900 Constipation, unspecified: Secondary | ICD-10-CM | POA: Diagnosis not present

## 2020-06-03 LAB — GLUCOSE, CAPILLARY
Glucose-Capillary: 139 mg/dL — ABNORMAL HIGH (ref 70–99)
Glucose-Capillary: 160 mg/dL — ABNORMAL HIGH (ref 70–99)
Glucose-Capillary: 124 mg/dL — ABNORMAL HIGH (ref 70–99)
Glucose-Capillary: 152 mg/dL — ABNORMAL HIGH (ref 70–99)
Glucose-Capillary: 155 mg/dL — ABNORMAL HIGH (ref 70–99)

## 2020-06-03 LAB — CK: Total CK: 29 U/L — ABNORMAL LOW (ref 49–397)

## 2020-06-03 MED ORDER — SODIUM CHLORIDE 0.9 % IV SOLN
1000.0000 mg | Freq: Every day | INTRAVENOUS | Status: AC
  Administered 2020-06-03 – 2020-06-07 (×5): 1000 mg via INTRAVENOUS
  Filled 2020-06-03 (×5): qty 8

## 2020-06-03 NOTE — Plan of Care (Signed)

## 2020-06-03 NOTE — Progress Notes (Signed)
Subjective: The patient states that his LUE strength has not changed since last seen on Friday.   Objective: Current vital signs: BP 130/87 (BP Location: Right Arm)   Pulse 95   Temp 98 F (36.7 C) (Oral)   Resp 16   Ht 5\' 10"  (1.778 m)   Wt (!) 164.7 kg   SpO2 97%   BMI 52.09 kg/m  Vital signs in last 24 hours: Temp:  [97.7 F (36.5 C)-98 F (36.7 C)] 98 F (36.7 C) (10/24 0550) Pulse Rate:  [95-110] 95 (10/24 0337) Resp:  [16-18] 16 (10/24 0337) BP: (130-160)/(71-93) 130/87 (10/24 0337) SpO2:  [95 %-99 %] 97 % (10/24 0337) Weight:  [164.7 kg] 164.7 kg (10/24 0337)  Intake/Output from previous day: 10/23 0701 - 10/24 0700 In: 832 [P.O.:832] Out: 1250 [Urine:1250] Intake/Output this shift: No intake/output data recorded. Nutritional status:  Diet Order            DIET DYS 3 Room service appropriate? Yes with Assist; Fluid consistency: Nectar Thick  Diet effective now                Physical Exam  General: Super-morbid obesity.  HEENT-  [place]/AT   Lungs- Mild wheezing noted.  Extremities- Some dressings to legs noted bilaterally . No cyanosis or pallor.   Neurological Examination Mental Status: Awake, alert and oriented. Appears fatigued. Somewhat flattened affect. Speech fluent with intact comprehension.  Cranial Nerves: II: Fixates and tracks normally.   III,IV, VI: No ptosis. EOMI. No nystagmus. VII: Smile symmetric VIII: Hearing intact to voice IX,X: No hypophonia XI: Head is midline.  XII: No lingual dysarthria Motor: RUE 4/5 proximally and distally LUE:  Shoulder with 0/5 strength in extension, abduction and flexion, but with 2/5 strength in adduction.  Biceps 0/5 Triceps 1-2/5 Wrist flexion 1-2/5, wrist extension 1/5 Finger flexion 2/5 Finger extension 1/5 RLE 4/5 proximally and distally LLE 4/5 proximally and distally Sensory: Unchanged deficits since last exam.   Deep Tendon Reflexes: 1+ right brachioradialis, trace right triceps  (adiposity) 0 left brachioradialis and triceps Cerebellar: No ataxia with RUE movements  Gait: Unable to assess  Lab Results: Results for orders placed or performed during the hospital encounter of 05/03/20 (from the past 48 hour(s))  Glucose, capillary     Status: Abnormal   Collection Time: 06/01/20  1:16 PM  Result Value Ref Range   Glucose-Capillary 136 (H) 70 - 99 mg/dL    Comment: Glucose reference range applies only to samples taken after fasting for at least 8 hours.  Glucose, capillary     Status: Abnormal   Collection Time: 06/01/20  4:32 PM  Result Value Ref Range   Glucose-Capillary 193 (H) 70 - 99 mg/dL    Comment: Glucose reference range applies only to samples taken after fasting for at least 8 hours.  Glucose, capillary     Status: Abnormal   Collection Time: 06/01/20  8:08 PM  Result Value Ref Range   Glucose-Capillary 150 (H) 70 - 99 mg/dL    Comment: Glucose reference range applies only to samples taken after fasting for at least 8 hours.   Comment 1 Notify RN   Glucose, capillary     Status: Abnormal   Collection Time: 06/02/20 12:35 AM  Result Value Ref Range   Glucose-Capillary 157 (H) 70 - 99 mg/dL    Comment: Glucose reference range applies only to samples taken after fasting for at least 8 hours.   Comment 1 Notify RN   Glucose,  capillary     Status: Abnormal   Collection Time: 06/02/20  4:01 AM  Result Value Ref Range   Glucose-Capillary 127 (H) 70 - 99 mg/dL    Comment: Glucose reference range applies only to samples taken after fasting for at least 8 hours.   Comment 1 Notify RN   Basic metabolic panel     Status: Abnormal   Collection Time: 06/02/20  4:15 AM  Result Value Ref Range   Sodium 137 135 - 145 mmol/L   Potassium 3.7 3.5 - 5.1 mmol/L   Chloride 96 (L) 98 - 111 mmol/L   CO2 32 22 - 32 mmol/L   Glucose, Bld 117 (H) 70 - 99 mg/dL    Comment: Glucose reference range applies only to samples taken after fasting for at least 8 hours.   BUN  12 6 - 20 mg/dL   Creatinine, Ser 2.950.46 (L) 0.61 - 1.24 mg/dL   Calcium 8.9 8.9 - 62.110.3 mg/dL   GFR, Estimated >30>60 >86>60 mL/min    Comment: (NOTE) Calculated using the CKD-EPI Creatinine Equation (2021)    Anion gap 9 5 - 15    Comment: Performed at [facility], [address] [place], [place] [id number]  Glucose, capillary     Status: Abnormal   Collection Time: 06/02/20  7:27 AM  Result Value Ref Range   Glucose-Capillary 109 (H) 70 - 99 mg/dL    Comment: Glucose reference range applies only to samples taken after fasting for at least 8 hours.  Glucose, capillary     Status: Abnormal   Collection Time: 06/02/20 11:26 AM  Result Value Ref Range   Glucose-Capillary 213 (H) 70 - 99 mg/dL    Comment: Glucose reference range applies only to samples taken after fasting for at least 8 hours.  Glucose, capillary     Status: Abnormal   Collection Time: 06/02/20  4:14 PM  Result Value Ref Range   Glucose-Capillary 127 (H) 70 - 99 mg/dL    Comment: Glucose reference range applies only to samples taken after fasting for at least 8 hours.  Glucose, capillary     Status: Abnormal   Collection Time: 06/02/20  8:57 PM  Result Value Ref Range   Glucose-Capillary 245 (H) 70 - 99 mg/dL    Comment: Glucose reference range applies only to samples taken after fasting for at least 8 hours.  Glucose, capillary     Status: Abnormal   Collection Time: 06/03/20  8:26 AM  Result Value Ref Range   Glucose-Capillary 139 (H) 70 - 99 mg/dL    Comment: Glucose reference range applies only to samples taken after fasting for at least 8 hours.    No results found for this or any previous visit (from the past 240 hour(s)).  Lipid Panel No results for input(s): CHOL, TRIG, HDL, CHOLHDL, VLDL, LDLCALC in the last 72 hours.  Studies/Results: CT CHEST W CONTRAST  Result Date: 06/02/2020 CLINICAL DATA:  Shoulder pain, evaluate for brachial plexus mass. EXAM: CT CHEST WITH CONTRAST TECHNIQUE:  Multidetector CT imaging of the chest was performed during intravenous contrast administration. CONTRAST:  100mL OMNIPAQUE IOHEXOL 300 MG/ML  SOLN COMPARISON:  05/16/2020 FINDINGS: Cardiovascular: Normal heart size. No pericardial effusion. No acute vascular finding. Mediastinum/Nodes: Negative for adenopathy or mass. Resolved pneumomediastinum. Lungs/Pleura: Evolution of COVID associated pneumonia which has a more streaky and reticular appearance than the ground-glass density seen previously (fibrotic features). No edema, effusion, or pneumothorax. Upper Abdomen: Unremarkable Musculoskeletal: Concerning the patient's  left brachial plexus symptoms, no mass or fluid collection is seen at the level of the left brachial plexus. A cervical MRI was recently performed which covered the roots, which were also unremarkable. IMPRESSION: 1. No abnormality seen at the level of the left brachial plexus. 2. Evolving COVID pneumonia. Electronically Signed   By: Marnee Spring M.D.   On: 06/02/2020 12:09    Medications:  Scheduled: . vitamin C  500 mg Oral Daily  . aspirin  81 mg Oral Daily  . chlorhexidine  15 mL Mouth Rinse BID  . Chlorhexidine Gluconate Cloth  6 each Topical Q0600  . enoxaparin (LOVENOX) injection  0.5 mg/kg Subcutaneous Q24H  . feeding supplement (NEPRO CARB STEADY)  237 mL Oral TID BM  . insulin aspart  0-20 Units Subcutaneous TID WC  . insulin aspart  0-5 Units Subcutaneous QHS  . insulin detemir  20 Units Subcutaneous BID  . mouth rinse  15 mL Mouth Rinse q12n4p  . multivitamin with minerals  1 tablet Oral Daily  . polyethylene glycol  17 g Oral BID  . senna  1 tablet Oral BID   Continuous: . sodium chloride Stopped (05/22/20 8366)   Assessment: 34 year old male recovering from SARS/Covid PNA. Neurology was consulted for severe LUE weakness and sensory loss since self extubation. Today the patient also complains of sensory loss in digits 4-5 of his right hand, which has been present  since he self-extubated.  1. Exam reveals weakness and sensory loss to LUE that best localizes to the brachial plexus; findings suggest a diffuse plexopathy. CT of left brachial plexus does not reveal a structural etiology. More advanced imaging (MRI) and EMG/NCS will be needed to further localize.  2. MRI brain and cervical spine have revealed no stroke or myelopathy to explain his LUE weakness with sensory loss. Also without any nerve root impingement to explain his presentation. No gross inflammation or mass seen in the region of the brachial plexus on CT with contrast obtained yesterday.  3. Deconditioning +/- an ICU neuropathy likely explains the diffuse weakness seen in his RUE and BLE. The LUE also likely also has been affected with the above process but there would need to be an additional left sided brachial plexus lesion to explain the almost complete monoplegia of the LUE. A myopathy is felt to be significantly less likely given the sensory loss. 4. CT of chest with contrast (10/23): No abnormality seen at the level of the left brachial plexus. Evolving COVID pneumonia.  Recommendations: 1. As mass lesion and gross inflammation affecting the brachial plexus have essentially been ruled out by CT, an inflammatory or ischemic plexopathy as a complication of Covid infection are now higher on the DDx. An MRI of the left brachial plexus with and without contrast would be helpful to further assess. However, due to his body habitus the patient will not be able to obtain a conventional MRI. An open MRI can be considered, but this would need to be obtained as an outpatient.   3. Outpatient EMG/NCS of left upper extremity and paraspinals of the neck will be needed after discharge.   4. PT and OT.  5. OOB to chair daily as soon as able. Patients with ICU neuropathy tend to recover more quickly when spending most of their daytime hours sitting in various positions rather than continuing to be bedbound.  6.  Empiric treatment of possible brachial plexitis with IV methylprednisolone has been discussed with the patient. Risks including hyperglycemia, mania,  increased susceptibility to infection and electrolyte disturbance were discussed. Potential benefit of treatment of an inflammatory process with possible more rapid or complete resolution of his LUE weakness and sensory loss was discussed. The patient expressed agreement and understanding with the plan. He provided informed consent to proceed with a 5 day course of IV methylprednisolone, which will be dosed at 1000 mg qd.  7. Will need regular CBG and daily electrolytes and CBC during treatment (ordered).     LOS: 31 days   @Electronically  signed: Dr. 06/03/2020  9:45 AM

## 2020-06-03 NOTE — Progress Notes (Addendum)
PROGRESS NOTE  Collin Brown NAT:[id number] DOB: [birth year] DOA: 05/03/2020 PCP: Patient, No Pcp Per   LOS: 31 days   Brief narrative: As per HPI,  Collin Brown is a 34 y.o. male with medical history significant for obstructive sleep apnea, morbid obesity, presented to the hospital on May 03, 2020 with cough, shortness of breath, generalized weakness, fever and a positive Covid 19 test. He was admitted to the medical floor for acute hypoxemic respiratory failure secondary to COVID-19 pneumonia. He was treated with IV remdesivir and IV steroids. Baricitinib was not given because of leukopenia. His condition deteriorated and hypoxemia worsened. He was transferred to the ICU on May 05, 2020 where he was initially treated with BiPAP. However, he failed BiPAP therapy and so he was eventually intubated and placed on mechanical ventilation on May 05, 2020. He self extubated on May 20, 2020 was transitioned to BiPAP. Hospital course was complicated by acute metabolic encephalopathy and ARDS. His condition slowly improved and was transitioned to oxygenation via high flow nasal cannula.  He was then transferred to the floor for further management.  Patient continued to have left upper extremity weakness after transfer from the ICU.  Assessment/Plan:  Principal Problem:   Acute hypoxemic respiratory failure due to COVID-19 [facility]) Active Problems:   Sleep apnea   Pneumonia due to COVID-19 virus   Pressure injury of skin  Acute hypoxic respiratory failure secondary to COVID-19 pneumonia with ARDS:  Completed treatment with IV steroids.  Patient self extubated and has been on BiPAP at nighttime and nasal cannula during the daytime.  Currently on 5 L of oxygen , will continue to wean as possible.  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   MRI of the brain showed chronic microhemorrhages, C-spine MRI showed minor degenerative change at C4-C5 but no abnormal cord  signal.  CT scan of the chest with contrast to shoulder no obvious changes in the brachial plexus.  Spoke with neurology Dr. Otelia Limes.  He is going to assess the patient today and provide further input. Patient might need the EMG as outpatient.  Right  mild ulnar neuropathy,  Continue supportive care.  Patient has numbness over the right fourth and fifth fingers.  Spoke with neurology.  Type II DM: Hemoglobin A1c was 7.2.  Continue Levemir and Humalog sliding scale insulin.  Lipid panel noted with elevated total cholesterol LDL was 153.  We will start the patient on moderate intensity statin prior to discharge.   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  Nasal cannula during the daytime.  Continue to wean as able.  Extreme debility, deconditioning.  Continue PT, OT.  Physical therapy  recommended CIR.  Transition of care on board for placement.  Severe constipation.  Improved.  Continue senna, MiraLAX.   Pressure ulceration. Present on admission. Continue Wound care.   Pressure Injury 05/12/20 Toe (Comment  which one) Anterior;Right Stage 2 -  Partial thickness loss of dermis presenting as a shallow open injury with a red, pink wound bed without slough. (Active)  05/12/20 1930  Location: Toe (Comment  which one)  Location Orientation: Anterior;Right  Staging: Stage 2 -  Partial thickness loss of dermis presenting as a shallow open injury with a red, pink wound bed without slough.  Wound Description (Comments):   Present on Admission: No     Pressure Injury 05/12/20 Toe (Comment  which one) Anterior;Left Stage 2 -  Partial thickness loss of dermis  presenting as a shallow open injury with a red, pink wound bed without slough. (Active)  05/12/20 1930  Location: Toe (Comment  which one)  Location Orientation: Anterior;Left  Staging: Stage 2 -  Partial thickness loss of dermis presenting as a shallow open injury with a red, pink  wound bed without slough.  Wound Description (Comments):   Present on Admission: No     Pressure Injury 05/21/20 Buttocks Right;Left purple/white (Active)  05/21/20 2123  Location: Buttocks  Location Orientation: Right;Left  Staging:   Wound Description (Comments): purple/white  Present on Admission:     DVT prophylaxis: : Subcu Lovenox  Code Status: Full code  Family Communication: With the patient's mother on the phone but was unable to reach her.  Communicated with the nursing staff to let me know when she visits him.   Status is: Inpatient  Remains inpatient appropriate because:ISevere left upper extremity flaccid paralysis , need for ongoing rehab  Dispo: The patient is from: Home              Anticipated d/c is to: SNF /CIR              Anticipated d/c date is: Undetermined at this time.              Patient currently is  medically stable to d/c.  Will likely need long-term oxygen and BiPAP at nighttime.  Consultants:  Critical care  Neurology  Procedures:  Intubation mechanical ventilation on May 05, 2020  Self extubated on May 20, 2020  BiPAP placement  Antibiotics:  . None at this time  Subjective: Today, patient denies increased shortness of breath, chest pain fever or chills.  Complains of mild numbness of the left upper extremity.  Numbness on the right ulnar region.   Objective: Vitals:   06/03/20 0337 06/03/20 0550  BP: 130/87   Pulse: 95   Resp: 16   Temp:  98 F (36.7 C)  SpO2: 97%     Intake/Output Summary (Last 24 hours) at 06/03/2020 0818 Last data filed at 06/03/2020 0700 Gross per 24 hour  Intake 832 ml  Output 1250 ml  Net -418 ml   Filed Weights   06/01/20 0447 06/02/20 0402 06/03/20 0337  Weight: (!) 161.9 kg (!) 164.8 kg (!) 164.7 kg   Body mass index is 52.09 kg/m.   Physical Exam:  General: Morbidly obese, not in obvious distress, on nasal cannula HENT:   No scleral pallor or icterus noted. Oral mucosa is moist.   Dilated pupils bilaterally.  Reactive to light. Chest:  Clear breath sounds.  Diminished breath sounds bilaterally. No crackles or wheezes.  CVS: S1 &S2 heard. No murmur.  Regular rate and rhythm. Abdomen: Soft, nontender, obese abdomen, nondistended.  Bowel sounds are heard.   Extremities: No cyanosis, clubbing or edema.  Peripheral pulses are palpable.  Left upper extremity flaccid weakness with some paresthesia.  Right fourth and fifth finger area with numbness. Psych: Alert, awake and communicative.  CNS:  No cranial nerve deficits.  Generalized weakness of extremities.  Left upper extremity monoplegia. Right fourth and fifth finger area with numbness. Skin: Warm and dry.     Data Review: I have personally reviewed the following laboratory data and studies,  CBC: Recent Labs  Lab 05/28/20 0405 05/29/20 0507 05/30/20 0427 05/31/20 0603  WBC 5.8 5.3 5.8 5.6  NEUTROABS 3.7 3.5 3.7  --   HGB 10.9* 10.7* 11.4* 10.9*  HCT 35.5* 34.7* 36.0* 33.7*  MCV  91.7 91.3 89.3 89.4  PLT 260 232 221 172   Basic Metabolic Panel: Recent Labs  Lab 05/28/20 0405 05/29/20 0507 05/30/20 0427 05/31/20 0603 06/02/20 0415  NA 143  --   --  138 137  K 3.5  --   --  3.3* 3.7  CL 101  --   --  96* 96*  CO2 32  --   --  34* 32  GLUCOSE 122*  --   --  107* 117*  BUN 29*  --   --  17 12  CREATININE 0.60*  --   --  0.41* 0.46*  CALCIUM 8.9  --   --  8.8* 8.9  MG 2.3 2.3 2.1  --   --   PHOS 4.9* 4.6 4.5  --   --    Liver Function Tests: Recent Labs  Lab 05/31/20 0603  AST 23  ALT 72*  ALKPHOS 63  BILITOT 0.7  PROT 6.0*  ALBUMIN 3.1*   No results for input(s): LIPASE, AMYLASE in the last 168 hours. No results for input(s): AMMONIA in the last 168 hours. Cardiac Enzymes: No results for input(s): CKTOTAL, CKMB, CKMBINDEX, TROPONINI in the last 168 hours. BNP (last 3 results) Recent Labs    05/03/20 1633 05/16/20 1435  BNP 31.1 77.7    ProBNP (last 3 results) No results for input(s):  PROBNP in the last 8760 hours.  CBG: Recent Labs  Lab 06/02/20 0401 06/02/20 0727 06/02/20 1126 06/02/20 1614 06/02/20 2057  GLUCAP 127* 109* 213* 127* 245*   No results found for this or any previous visit (from the past 240 hour(s)).   Studies: CT CHEST W CONTRAST  Result Date: 06/02/2020 CLINICAL DATA:  Shoulder pain, evaluate for brachial plexus mass. EXAM: CT CHEST WITH CONTRAST TECHNIQUE: Multidetector CT imaging of the chest was performed during intravenous contrast administration. CONTRAST:  OMNIPAQUE IOHEXOL 300 MG/ML  SOLN COMPARISON:  05/16/2020 FINDINGS: Cardiovascular: Normal heart size. No pericardial effusion. No acute vascular finding. Mediastinum/Nodes: Negative for adenopathy or mass. Resolved pneumomediastinum. Lungs/Pleura: Evolution of COVID associated pneumonia which has a more streaky and reticular appearance than the ground-glass density seen previously (fibrotic features). No edema, effusion, or pneumothorax. Upper Abdomen: Unremarkable Musculoskeletal: Concerning the patient's left brachial plexus symptoms, no mass or fluid collection is seen at the level of the left brachial plexus. A cervical MRI was recently performed which covered the roots, which were also unremarkable. IMPRESSION: 1. No abnormality seen at the level of the left brachial plexus. 2. Evolving COVID pneumonia. Electronically Signed   By: Marnee Spring M.D.   On: 06/02/2020 12:09      Joycelyn Das, MD  [facility] 06/03/2020

## 2020-06-03 NOTE — Plan of Care (Signed)
  Problem: Clinical Measurements: Goal: Respiratory complications will improve Outcome: Progressing Goal: Cardiovascular complication will be avoided Outcome: Progressing   Problem: Elimination: Goal: Will not experience complications related to urinary retention Outcome: Progressing   Problem: Pain Managment: Goal: General experience of comfort will improve Outcome: Progressing   Problem: Safety: Goal: Ability to remain free from injury will improve Outcome: Progressing   Problem: Clinical Measurements: Goal: Will remain free from infection Outcome: Progressing Goal: Respiratory complications will improve Outcome: Progressing Goal: Cardiovascular complication will be avoided Outcome: Progressing   Problem: Elimination: Goal: Will not experience complications related to urinary retention Outcome: Progressing   Problem: Pain Managment: Goal: General experience of comfort will improve Outcome: Progressing   Problem: Safety: Goal: Ability to remain free from injury will improve Outcome: Progressing

## 2020-06-03 NOTE — Plan of Care (Signed)
  Problem: Education: Goal: Knowledge of General Education information will improve Description: Including pain rating scale, medication(s)/side effects and non-pharmacologic comfort measures Outcome: Progressing   Problem: Health Behavior/Discharge Planning: Goal: Ability to manage health-related needs will improve Outcome: Progressing   Problem: Clinical Measurements: Goal: Respiratory complications will improve Outcome: Progressing   Problem: Clinical Measurements: Goal: Cardiovascular complication will be avoided Outcome: Progressing   Problem: Nutrition: Goal: Adequate nutrition will be maintained Outcome: Progressing   Problem: Elimination: Goal: Will not experience complications related to bowel motility Outcome: Progressing   Problem: Elimination: Goal: Will not experience complications related to urinary retention Outcome: Progressing   Problem: Pain Managment: Goal: General experience of comfort will improve Outcome: Progressing   Problem: Safety: Goal: Ability to remain free from injury will improve Outcome: Progressing

## 2020-06-03 NOTE — Progress Notes (Signed)
   06/03/20 1143  Assess: MEWS Score  Temp 97.7 F (36.5 C)  BP (!) 135/59  Pulse Rate (!) 111  Resp 19  Level of Consciousness Alert  SpO2 97 %  O2 Device Nasal Cannula  O2 Flow Rate (L/min) 5 L/min  Assess: MEWS Score  MEWS Temp 0  MEWS Systolic 0  MEWS Pulse 2  MEWS RR 0  MEWS LOC 0  MEWS Score 2  MEWS Score Color Yellow  Assess: if the MEWS score is Yellow or Red  Were vital signs taken at a resting state? Yes  Focused Assessment Change from prior assessment (see assessment flowsheet)  Early Detection of Sepsis Score *See Row Information* Low  MEWS guidelines implemented *See Row Information* Yes  Treat  MEWS Interventions Escalated (See documentation below)  Pain Scale 0-10  Pain Score 5  Pain Type Chronic pain  Pain Location Hand  Pain Orientation Left  Pain Intervention(s) Repositioned;Medication (See eMAR)  Take Vital Signs  Increase Vital Sign Frequency  Yellow: Q 2hr X 2 then Q 4hr X 2, if remains yellow, continue Q 4hrs  Escalate  MEWS: Escalate Yellow: discuss with charge nurse/RN and consider discussing with provider and RRT  Notify: Charge Nurse/RN  Name of Charge Nurse/RN Notified Leanora Ivanoff  Date Charge Nurse/RN Notified 06/03/20  Time Charge Nurse/RN Notified 1148  Notify: Provider  Provider Name/Title Joycelyn Das  Date Provider Notified 06/03/20  Time Provider Notified 1200  Notification Type Page  Notification Reason Other (Comment) (MEWS)  Response No new orders  Date of Provider Response 06/03/20  Time of Provider Response 1204

## 2020-06-04 DIAGNOSIS — K5900 Constipation, unspecified: Secondary | ICD-10-CM | POA: Diagnosis not present

## 2020-06-04 DIAGNOSIS — J9601 Acute respiratory failure with hypoxia: Secondary | ICD-10-CM | POA: Diagnosis not present

## 2020-06-04 DIAGNOSIS — U071 COVID-19: Secondary | ICD-10-CM | POA: Diagnosis not present

## 2020-06-04 DIAGNOSIS — G4733 Obstructive sleep apnea (adult) (pediatric): Secondary | ICD-10-CM | POA: Diagnosis not present

## 2020-06-04 LAB — CBC WITH DIFFERENTIAL/PLATELET
Abs Immature Granulocytes: 0.04 10*3/uL (ref 0.00–0.07)
Basophils Absolute: 0 10*3/uL (ref 0.0–0.1)
Basophils Relative: 0 %
Eosinophils Absolute: 0 10*3/uL (ref 0.0–0.5)
Eosinophils Relative: 0 %
HCT: 39.8 % (ref 39.0–52.0)
Hemoglobin: 12.8 g/dL — ABNORMAL LOW (ref 13.0–17.0)
Immature Granulocytes: 1 %
Lymphocytes Relative: 8 %
Lymphs Abs: 0.5 10*3/uL — ABNORMAL LOW (ref 0.7–4.0)
MCH: 28.1 pg (ref 26.0–34.0)
MCHC: 32.2 g/dL (ref 30.0–36.0)
MCV: 87.5 fL (ref 80.0–100.0)
Monocytes Absolute: 0 10*3/uL — ABNORMAL LOW (ref 0.1–1.0)
Monocytes Relative: 1 %
Neutro Abs: 6.2 10*3/uL (ref 1.7–7.7)
Neutrophils Relative %: 90 %
Platelets: 147 10*3/uL — ABNORMAL LOW (ref 150–400)
RBC: 4.55 MIL/uL (ref 4.22–5.81)
RDW: 17.5 % — ABNORMAL HIGH (ref 11.5–15.5)
WBC: 6.8 10*3/uL (ref 4.0–10.5)
nRBC: 0 % (ref 0.0–0.2)

## 2020-06-04 LAB — GLUCOSE, CAPILLARY
Glucose-Capillary: 204 mg/dL — ABNORMAL HIGH (ref 70–99)
Glucose-Capillary: 257 mg/dL — ABNORMAL HIGH (ref 70–99)
Glucose-Capillary: 267 mg/dL — ABNORMAL HIGH (ref 70–99)
Glucose-Capillary: 294 mg/dL — ABNORMAL HIGH (ref 70–99)
Glucose-Capillary: 332 mg/dL — ABNORMAL HIGH (ref 70–99)

## 2020-06-04 LAB — BASIC METABOLIC PANEL
Anion gap: 11 (ref 5–15)
BUN: 18 mg/dL (ref 6–20)
CO2: 28 mmol/L (ref 22–32)
Calcium: 9.6 mg/dL (ref 8.9–10.3)
Chloride: 96 mmol/L — ABNORMAL LOW (ref 98–111)
Creatinine, Ser: 0.63 mg/dL (ref 0.61–1.24)
GFR, Estimated: >60 mL/min (ref >60)
Glucose, Bld: 256 mg/dL — ABNORMAL HIGH (ref 70–99)
Potassium: 4.2 mmol/L (ref 3.5–5.1)
Sodium: 135 mmol/L (ref 135–145)

## 2020-06-04 LAB — TSH: TSH: 1.078 u[IU]/mL (ref 0.350–4.500)

## 2020-06-04 MED ORDER — LIVING WELL WITH DIABETES BOOK
Freq: Once | Status: AC
  Filled 2020-06-04: qty 1

## 2020-06-04 MED ORDER — INSULIN ASPART 100 UNIT/ML ~~LOC~~ SOLN
4.0000 [IU] | Freq: Three times a day (TID) | SUBCUTANEOUS | Status: DC
  Administered 2020-06-04 – 2020-06-08 (×12): 4 [IU] via SUBCUTANEOUS
  Filled 2020-06-04 (×12): qty 1

## 2020-06-04 MED ORDER — INSULIN DETEMIR 100 UNIT/ML ~~LOC~~ SOLN
30.0000 [IU] | Freq: Two times a day (BID) | SUBCUTANEOUS | Status: AC
  Administered 2020-06-04 – 2020-06-07 (×7): 30 [IU] via SUBCUTANEOUS
  Filled 2020-06-04 (×8): qty 0.3

## 2020-06-04 MED ORDER — METOPROLOL TARTRATE 25 MG PO TABS
25.0000 mg | ORAL_TABLET | Freq: Two times a day (BID) | ORAL | Status: DC
  Administered 2020-06-04 – 2020-06-06 (×5): 25 mg via ORAL
  Filled 2020-06-04 (×6): qty 1

## 2020-06-04 NOTE — Progress Notes (Signed)
PROGRESS NOTE  Collin Brown QIH:[id number] DOB: 1985/09/24 DOA: 05/03/2020 PCP: Patient, No Pcp Per   LOS: 32 days   Brief narrative: As per HPI,  Collin Brown is a 34 y.o. male with medical history significant for obstructive sleep apnea, morbid obesity, presented to the hospital on May 03, 2020 with cough, shortness of breath, generalized weakness, fever and a positive Covid 19 test. He was admitted to the medical floor for acute hypoxemic respiratory failure secondary to COVID-19 pneumonia. He was treated with IV remdesivir and IV steroids. Baricitinib was not given because of leukopenia. His condition deteriorated and hypoxemia worsened. He was transferred to the ICU on May 05, 2020 where he was initially treated with BiPAP. However, he failed BiPAP therapy and so he was eventually intubated and placed on mechanical ventilation on May 05, 2020. He self extubated on May 20, 2020 was transitioned to BiPAP. Hospital course was complicated by acute metabolic encephalopathy and ARDS. His condition slowly improved and was transitioned to oxygenation via high flow nasal cannula.  He was then transferred to the floor for further management.  Patient continued to have left upper extremity weakness after transfer from the ICU.  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   MRI of the brain showed chronic microhemorrhages, C-spine MRI showed minor degenerative change at C4-C5 but no abnormal cord signal.  CT scan of the chest with contrast to shoulder no obvious changes in the brachial plexus.  Neurology on board and has been started on high-dose IV steroids for possible brachial plexopathy.. slight improvement today, more mobility in the fingers and forearm.   Acute  hypoxic respiratory failure secondary to COVID-19 pneumonia with ARDS:  Completed treatment with IV steroids.  Patient self extubated and has been on BiPAP at nighttime and nasal cannula during the daytime.  Currently on 2 L of oxygen , will continue to wean as possible.  Right  mild ulnar neuropathy,  Continue supportive care.  Patient has numbness over the right fourth and fifth fingers.  On IV steroids.  Type II DM: Hemoglobin A1c was 7.2.  Continue Levemir and Humalog sliding scale insulin.  Lipid panel noted with elevated total cholesterol LDL was 153.  We will start the patient on moderate intensity statin prior to discharge.   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  Nasal cannula during the daytime.  Continue to wean as able.  Extreme debility, deconditioning.  Continue PT, OT.  Physical therapy  recommended CIR.  Transition of care on board for placement.  Severe constipation.  Improved.  Continue senna, MiraLAX.  Will need to take daily.   Pressure ulceration. Present on admission. Continue Wound care.   Pressure Injury 05/12/20 Toe (Comment  which one) Anterior;Right Stage 2 -  Partial thickness loss of dermis presenting as a shallow open injury with a red, pink wound bed without slough. (Active)  05/12/20 1930  Location: Toe (Comment  which one)  Location Orientation: Anterior;Right  Staging: Stage 2 -  Partial thickness loss of dermis presenting as a shallow open injury with a red, pink wound bed without slough.  Wound Description (Comments):   Present on Admission: No     Pressure Injury 05/12/20 Toe (Comment  which one) Anterior;Left Stage 2 -  Partial thickness loss of dermis presenting as a shallow open injury with a red, pink wound bed without slough. (Active)  05/12/20 1930  Location: Toe (Comment  which one)  Location Orientation: Anterior;Left  Staging: Stage 2 -  Partial thickness loss of dermis  presenting as a shallow open injury with a red, pink wound bed without slough.  Wound Description (Comments):   Present on Admission: No     Pressure Injury 05/21/20 Buttocks Right;Left purple/white (Active)  05/21/20 2123  Location: Buttocks  Location Orientation: Right;Left  Staging:   Wound Description (Comments): purple/white  Present on Admission:     DVT prophylaxis: : Subcu Lovenox  Code Status: Full code  Family Communication:  None today.    Status is: Inpatient  Remains inpatient appropriate because: Severe left upper extremity flaccid paralysis , need for ongoing rehab  Dispo: The patient is from: Home              Anticipated d/c is to: SNF /CIR              Anticipated d/c date is: Undetermined at this time.              Patient currently is not medically stable to d/c.  Will likely need long-term oxygen and BiPAP at nighttime.  Currently undergoing high-dose IV steroids. Will continue to wean oxygen as able.   Consultants:  Critical care  Neurology  Procedures:  Intubation mechanical ventilation on May 05, 2020  Self extubated on May 20, 2020  BiPAP placement  Antibiotics:  . None at this time  Subjective: Today, feels little better today.  Numbness and strength in the arm has improved.   Objective: Vitals:   06/04/20 0418 06/04/20 0744  BP: (!) 152/89 (!) 141/98  Pulse: 96 (!) 108  Resp: 18 19  Temp: 99.7 F (37.6 C) 97.6 F (36.4 C)  SpO2: 96% 97%    Intake/Output Summary (Last 24 hours) at 06/04/2020 0815 Last data filed at 06/04/2020 0557 Gross per 24 hour  Intake 956 ml  Output 930 ml  Net 26 ml   Filed Weights   06/02/20 0402 06/03/20 0337 06/04/20 0419  Weight: (!) 164.8 kg (!) 164.7 kg (!) 163.4 kg   Body mass index is 51.7 kg/m.   Physical Exam:  General: Morbidly obese, not in obvious distress, on nasal cannula HENT:   No scleral pallor or icterus noted. Oral mucosa is moist.  Dilated pupils bilaterally.  Reactive  to light. Chest:  Clear breath sounds.  Diminished breath sounds bilaterally. No crackles or wheezes.  CVS: S1 &S2 heard. No murmur.  Regular rate and rhythm. Abdomen: Soft, nontender, obese abdomen, nondistended.  Bowel sounds are heard.   Extremities: No cyanosis, clubbing or edema.  Peripheral pulses are palpable.  Left upper extremity flaccid weakness with some paresthesia.  Improved mobility of the fingers.  Right fourth and fifth finger area with numbness. Psych: Alert, awake and communicative.  CNS:  No cranial nerve deficits.  Generalized weakness of extremities.  Left upper extremity monoplegia. Right fourth and fifth finger area with numbness. Skin: Warm and dry.    Data Review: I have personally reviewed the following laboratory data and studies,  CBC: Recent Labs  Lab 05/29/20 0507 05/30/20 0427 05/31/20 0603  WBC 5.3 5.8 5.6  NEUTROABS 3.5 3.7  --   HGB 10.7* 11.4* 10.9*  HCT 34.7* 36.0* 33.7*  MCV 91.3 89.3 89.4  PLT 232 221 172   Basic Metabolic Panel:  Recent Labs  Lab 05/29/20 0507 05/30/20 0427 05/31/20 0603 06/02/20 0415 06/04/20 0718  NA  --   --  138 137 135  K  --   --  3.3* 3.7 4.2  CL  --   --  96* 96* 96*  CO2  --   --  34* 32 28  GLUCOSE  --   --  107* 117* 256*  BUN  --   --  17 12 18   CREATININE  --   --  0.41* 0.46* 0.63  CALCIUM  --   --  8.8* 8.9 9.6  MG 2.3 2.1  --   --   --   PHOS 4.6 4.5  --   --   --    Liver Function Tests: Recent Labs  Lab 05/31/20 0603  AST 23  ALT 72*  ALKPHOS 63  BILITOT 0.7  PROT 6.0*  ALBUMIN 3.1*   No results for input(s): LIPASE, AMYLASE in the last 168 hours. No results for input(s): AMMONIA in the last 168 hours. Cardiac Enzymes: Recent Labs  Lab 06/03/20 0905  CKTOTAL 29*   BNP (last 3 results) Recent Labs    05/03/20 1633 05/16/20 1435  BNP 31.1 77.7    ProBNP (last 3 results) No results for input(s): PROBNP in the last 8760 hours.  CBG: Recent Labs  Lab 06/03/20 1143  06/03/20 1646 06/03/20 1949 06/03/20 2325 06/04/20 0740  GLUCAP 160* 124* 152* 155* 294*   No results found for this or any previous visit (from the past 240 hour(s)).   Studies: CT CHEST W CONTRAST  Result Date: 06/02/2020 CLINICAL DATA:  Shoulder pain, evaluate for brachial plexus mass. EXAM: CT CHEST WITH CONTRAST TECHNIQUE: Multidetector CT imaging of the chest was performed during intravenous contrast administration. CONTRAST:  06/04/2020 OMNIPAQUE IOHEXOL 300 MG/ML  SOLN COMPARISON:  05/16/2020 FINDINGS: Cardiovascular: Normal heart size. No pericardial effusion. No acute vascular finding. Mediastinum/Nodes: Negative for adenopathy or mass. Resolved pneumomediastinum. Lungs/Pleura: Evolution of COVID associated pneumonia which has a more streaky and reticular appearance than the ground-glass density seen previously (fibrotic features). No edema, effusion, or pneumothorax. Upper Abdomen: Unremarkable Musculoskeletal: Concerning the patient's left brachial plexus symptoms, no mass or fluid collection is seen at the level of the left brachial plexus. A cervical MRI was recently performed which covered the roots, which were also unremarkable. IMPRESSION: 1. No abnormality seen at the level of the left brachial plexus. 2. Evolving COVID pneumonia. Electronically Signed   By: 07/16/2020 M.D.   On: 06/02/2020 12:09      06/04/2020, MD  [facility] 06/04/2020

## 2020-06-04 NOTE — Evaluation (Signed)
Occupational Therapy Re-Evaluation Patient Details Name: Collin Brown MRN: [id number] DOB: 03-16-1986 Today's Date: 06/04/2020    History of Present Illness Collin Brown is a 34 year old male who comes to [facility] on 9/23 after progression of dyspnea. Pt recently (+)COVID19 along with other family. Pt required intubation on 9/25, self extubated on 10/10. At evaluation pt presenting on HHHF @55L /min, 90%FiO2.   Clinical Impression   [name] was seen for OT re-evaluation this date, seen with PT for safe mobility. Pt presents to acute OT demonstrating impaired ADL performance and functional mobility 2/2 functional impairments of non-dominant RUE, decreased safety awareness, functional strength/ROM/balance deficits, and decreased activity tolerance. Pt currently requires MOD A x2 sup>sit - assist for trunk. TOTAL A don LUE sling in sitting. St<>stand x1 trial - pt weight shifted to his R requiring MAX A on the right and only MOD A on the left; unable to maintain >5 seconds and HR incr >150 bpm. SETUP + CGA tooth brushing and face washing seated EOB, MIN A washing hair - assist for back of head / thoroughness.   Pt noted to have improved active LUE ROM this date. Grossly 2-/5 wrist flexion/extension, digit flexion, and shoulder elevation. 0/5 elbow flexion/extension. Pt reports feeling cold sensation throughout LUE. Pt is eager to return to his PLOF with increased independence and functional use of his LUE and LLE. Pt educated on safe positioning of LUE in order to promote functional return, joint protection, and promote skin integrity on this date. Pt was motivated t/o session and return demonstrated ROM exercises with VCs for technique. No cognitive or visual deficits noted with assessment on this date. Pt would benefit from skilled OT to address noted impairments and functional limitations (see below for any additional details) in order to maximize safety and independence while minimizing falls risk and  caregiver burden. Upon hospital discharge, recommend pt discharge to CIR to maximize safety and return to PLOF.   Resting in bed: SpO2 92% on 5L [place], HR 127 Sitting EOB: SpO2 83% on 5L [place],resolved 88% c rest and PLB Standing: SpO2 83% on 5L, HR 150; unable to resolve c seated rest - per RN ok to increase to 6L, resolved 89/90%    Follow Up Recommendations  CIR    Equipment Recommendations   (TBD at next level of care)    Recommendations for Other Services       Precautions / Restrictions Precautions Precautions: Fall Precaution Comments: flaccid LUE (protect from subluxation) Required Braces or Orthoses: Sling (when EOB/OOB) Restrictions Weight Bearing Restrictions: No      Mobility Bed Mobility Overal bed mobility: Needs Assistance Bed Mobility: Supine to Sit;Sit to Supine;Rolling Rolling: Mod assist   Supine to sit: Mod assist;+2 for physical assistance;HOB elevated Sit to supine: Max assist;+2 for physical assistance   General bed mobility comments: MOD A trunk support sup>sit. MAX A x2 sit>sup for trunk and BLE. MOD A x2 + VCs for BLE positioning to scoot higher in bed    Transfers Overall transfer level: Needs assistance Equipment used: 2 person hand held assist Transfers: Sit to/from Stand Sit to Stand: Mod assist;Max assist;+2 physical assistance;From elevated surface         General transfer comment: Pt weight shifts to his R requiring MAX A on the right and only MOD A on the left for sit<>stand. Pt is unable to maintain >5 seconds: HR incr >150 bpm    Balance Overall balance assessment: Needs assistance Sitting-balance support: Feet supported Sitting  balance-Leahy Scale: Good Sitting balance - Comments: Pt able to maintain balance once feet are supported, and demonstrates good balance while reaching within BOS. Posterior lean and R lateral lean demonstrated  Postural control: Posterior lean;Right lateral lean Standing balance support: Bilateral upper  extremity supported;During functional activity Standing balance-Leahy Scale: Zero Standing balance comment: Pt able to stand with mod/maxA+2. VCs utilized to stand up straight, however patient unable to achieve fully        ADL either performed or assessed with clinical judgement   ADL Overall ADL's : Needs assistance/impaired    General ADL Comments: SETUP + CGA tooth brushing and face washing seated EOB, MIN A washing hair - assist for back of head / thoroughness. MAX A for LBD at bed level                   Pertinent Vitals/Pain Pain Assessment: No/denies pain     Hand Dominance Right   Extremity/Trunk Assessment Upper Extremity Assessment Upper Extremity Assessment: RUE deficits/detail;LUE deficits/detail RUE Deficits / Details: Grossly 4-/5  LUE Deficits / Details: 2-/5 wrist flexion/extension, digit flexion, and shoulder elevation. 0/5 elbow flexion/extension.  LUE Sensation: decreased light touch           Communication Communication Communication: No difficulties   Cognition Arousal/Alertness: Awake/alert Behavior During Therapy: WFL for tasks assessed/performed;Flat affect Overall Cognitive Status: Within Functional Limits for tasks assessed          General Comments: Responds to questions but does not volunteer information   General Comments  Monitored vitals throughout session: Resting in bed: O2: 92 5L, HR: 127; Sitting EOB: O2: 83 which increased to 88 after a few minutes; Standing: O2: 83% on 5L, HR: 150. Once back sitting, increased O2 to 6L with nursing approval. O2 sat levels increased back up to high 80s. Supine exercises: O2: 85% on 6L, increased to high 89/90% before leaving room    Exercises Exercises: Other exercises General Exercises - Lower Extremity Heel Slides: AAROM;Both;10 reps;Supine Hip ABduction/ADduction: AAROM;Both;10 reps;Supine Straight Leg Raises: AAROM;Both;10 reps;Supine Other Exercises Other Exercises: Pt educated re: LUE  HEP and joint protection strategies, neuro re-ed, falls prevention, ECS Other Exercises: Face washing, tooth brushing, LBD, sup<>sit, sit<>stand, sitting/standing balance/tolerance, rolling   Shoulder Instructions      Home Living Family/patient expects to be discharged to:: Private residence Living Arrangements: Parent;Other (Comment) (Mother and 19yo nephew) Available Help at Discharge: Family Type of Home: House       Home Layout: One level          Home Equipment: None          Prior Functioning/Environment Level of Independence: Independent        Comments: Pt wokred fulltime         OT Problem List: Decreased strength;Decreased cognition;Increased edema;Cardiopulmonary status limiting activity;Decreased coordination;Decreased activity tolerance;Obesity;Decreased knowledge of use of DME or AE;Impaired balance (sitting and/or standing);Impaired UE functional use      OT Treatment/Interventions: Self-care/ADL training;Therapeutic exercise;Therapeutic activities;Cognitive remediation/compensation;DME and/or AE instruction;Patient/family education;Balance training    OT Goals(Current goals can be found in the care plan section) Acute Rehab OT Goals Patient Stated Goal: Pt wants to return home and back to work when ready OT Goal Formulation: With patient Time For Goal Achievement: 06/18/20 Potential to Achieve Goals: Good ADL Goals Pt Will Perform Grooming: with supervision;sitting;with set-up Pt Will Transfer to Toilet: squat pivot transfer;bedside commode;with +2 assist;with mod assist (c LRAD PRN) Pt/caregiver will Perform Home Exercise Program: Increased ROM;Increased strength;Left  upper extremity;Independently  OT Frequency: Min 3X/week           Co-evaluation PT/OT/SLP Co-Evaluation/Treatment: Yes Reason for Co-Treatment: Complexity of the patient's impairments (multi-system involvement);To address functional/ADL transfers PT goals addressed during  session: Mobility/safety with mobility;Balance OT goals addressed during session: ADL's and self-care;Strengthening/ROM      AM-PAC OT "6 Clicks" Daily Activity     Outcome Measure Help from another person eating meals?: A Little Help from another person taking care of personal grooming?: A Little Help from another person toileting, which includes using toliet, bedpan, or urinal?: A Lot Help from another person bathing (including washing, rinsing, drying)?: A Lot Help from another person to put on and taking off regular upper body clothing?: A Lot Help from another person to put on and taking off regular lower body clothing?: A Lot 6 Click Score: 14   End of Session Equipment Utilized During Treatment: Gait belt;Oxygen (6L [place]) Nurse Communication: Other (comment) (ok to increased O2 )  Activity Tolerance: Patient tolerated treatment well Patient left: in bed;with call bell/phone within reach;Other (comment) (PT in room at end of session)  OT Visit Diagnosis: Other abnormalities of gait and mobility (R26.89);Muscle weakness (generalized) (M62.81);Other symptoms and signs involving cognitive function                Time: 9977-4142 OT Time Calculation (min): 41 min Charges:  OT General Charges $OT Visit: 1 Visit OT Evaluation $OT Re-eval: 1 Re-eval OT Treatments $Self Care/Home Management : 8-22 mins  Kathie Dike, M.S. OTR/L  06/04/20, 5:38 PM  ascom [phone]

## 2020-06-04 NOTE — Progress Notes (Signed)
Speech Language Pathology Treatment: Dysphagia  Patient Details Name: Collin Brown MRN: [id number] DOB: July 12, 1986 Today's Date: 06/04/2020 Time: 6010-9323 SLP Time Calculation (min) (ACUTE ONLY): 43 min  Assessment / Plan / Recommendation Clinical Impression  Pt seen for ongoing assessment of swallowing. He appears much improved and alert today; verbally responsive and able to follow instructions w/ min cues intermittently. Pt's vocal quality and overall verbal communication is improved; pt engaged in conversation w/ Mother upon entering room. Pt remains on [place] O2 support; wbc wnl, afebrile.  Pt explained general aspiration precautions and agreed verbally to the need for following them especially sitting upright for all oral intake. Also highlighted taking SMALL sips, SLOWLY to lessen risk for choking/aspirating. Pt assisted w/ positioning more upright w/ head forward d/t weakness then given trials of thin liquids via Cup. NO overt clinical s/s of aspiration were noted w/ trials of thins via Cup; respiratory status remained calm and unlabored, vocal quality clear b/t trials. Pt held Cup when drinking following instructions for single, small sips slowly w/ Min verbal cues given. When a straw was introduced(as pt had in other cups on tray table), overt coughing was noted inconsistently(x2 of several sips). Oral phase appeared [facility] for bolus management and timely A-P transfer for swallowing; oral clearing achieved w/ trials. Pt and Mother were educated on NOT using straws for better oral control when drinking and lessening risk for aspiration thus potential pulmonary decline. Also discussed the need for swallowing Pills Whole in Puree; the Puree providing cohesion for swallowing tablets.  Recommend continue a Dysphagia level 3 diet (mech soft) w/ gravies added to moisten foods d/t LUE weakness to Cut foods for himself; Thin liquids VIA CUP recommended. Recommend general aspiration precautions; Pills  Whole in Puree; tray setup and positioning assistance for meals. Assistance w/ feeding at meals as needed d/t overall weakness. OT is following. ST services can be reconsulted if new needs arise during admission. Education given; handouts left w/ pt/Mother. NSG updated. Precautions posted at bedside.  At discharge to SNF, recommend f/u w/ Cognitive evaluation secondary to lengthy illness if any new issues/decline in performance of ADLs, or concern for Safety awareness.    HPI HPI: Pt is a 34 yo with OSA, morbid Obesity BMI>56 came in with flu like illness diagnosed with COVID19 pneumonia was initially ion Medical floor with hospitalist service initiated Remdesevir and barictitinib, he progressively became more dyspneic he was placed on BIPAP and did not tolerate then placed on HFNC did not tolerate patient himself consented to MV and was intubated in MICU. Mother was notified who is sick at home with [place].  Pt was orally intubated on 05/05/2020; self-extubated on 05/20/2020.  Wears Bipap at night for support; O2 [place] during day.  Dysphonia, distractability requiring verbal cues currently.       SLP Plan  All goals met       Recommendations  Diet recommendations: Dysphagia 3 (mechanical soft);Thin liquid (cut foods d/t LUE weakness for cutting foods) Liquids provided via: Cup (less straw use d/t and/or if coughing) Medication Administration: Whole meds with puree (for safer swallowing) Supervision: Patient able to self feed;Staff to assist with self feeding;Intermittent supervision to cue for compensatory strategies Compensations: Minimize environmental distractions;Slow rate;Lingual sweep for clearance of pocketing;Small sips/bites;Follow solids with liquid Postural Changes and/or Swallow Maneuvers: Seated upright 90 degrees;Upright 30-60 min after meal (Reflux precautions)                General recommendations:  (Dietician f/u) Oral  Care Recommendations: Oral care BID;Oral care before and  after PO;Patient independent with oral care;Staff/trained caregiver to provide oral care Follow up Recommendations: Skilled Nursing facility;Inpatient Rehab;LTACH SLP Visit Diagnosis: Dysphagia, unspecified (R13.10) Plan: All goals met       GO                 Orinda Kenner, MS, CCC-SLP Speech Language Pathologist Rehab Services [phone] [facility] 06/04/2020, 2:50 PM

## 2020-06-04 NOTE — TOC Progression Note (Signed)
Transition of Care [facility]) - Progression Note    Patient Details  Name: Collin Brown MRN: [id number] Date of Birth: 08-31-1985  [facility] of [facility]) CM/SW Contact  Maree Krabbe, LCSW Phone Number: 06/04/2020, 3:34 PM  Clinical Narrative:   Recommendation still remains CIR. CIR will have to evaluate in order to determine if pt meets criteria. Pt on 5L and still on Bipap at night.    Expected Discharge Plan: Skilled Nursing Facility Barriers to Discharge: Continued Medical Work up  Expected Discharge Plan and Services Expected Discharge Plan: Skilled Nursing Facility In-house Referral: Clinical Social Work   Post Acute Care Choice: Skilled Nursing Facility Living arrangements for the past 2 months: Single Family Home                                       Social Determinants of Health (SDOH) Interventions    Readmission Risk Interventions No flowsheet data found.

## 2020-06-04 NOTE — Progress Notes (Signed)
Physical Therapy Treatment Patient Details Name: Collin Brown MRN: [id number] DOB: Jan 25, 1986 Today's Date: 06/04/2020    History of Present Illness Collin Brown is a 34 year old male who comes to [facility] on 9/23 after progression of dyspnea. Pt recently (+)COVID19 along with other family. Pt required intubation on 9/25, self extubated on 10/10. At evaluation pt presenting on HHHF @55L /min, 90%FiO2.    PT Comments    Pt presents in fowler's position on arrival to room for joint PT/OT treatment session. Patient is min/modA+2 to sit EOB as he needed assistance minimal assistance with BLE, but modA+2 for trunk support. VCs required throughout for proper technique. Once sitting EOB, patient demonstrates posterior and R lateral lean until his feet are supported on the floor. He performed sit<>stand with +2 assistance. Due to weight shifting towards his R side, pt was maxA on his R and only modA on his L. Pt required modA+2 for scooting up higher on EOB.  During sit to supine, patient is totalA+2 as he needs assistance with trunk and LEs. Once supine in bed, pt requires modA+2 for scooting up in bed as he uses his LE to push up. Pt is also modA+1 rolling bilateral sides to help straighten up bed linens underneath him. Once supine in bed pt performed exercises and encouraged to perform throughout his day. Monitored vitals throughout session and is noted below. VCs utilized for proper breathing and to slow down speed of breaths. He is left in bed with all needs. Pt continues to benefit from skilled PT services to maximize return to PLOF and minimize risk of future falls, injury, caregiver burden, and readmission. Will continue to follow POC. Discharge recommendation remains appropriate.  Vitals throughout session: Resting in bed: O2: 92% 5L, HR: 127 Sitting EOB: O2: 83% 4L which increased to 88% after a few minutes Standing: O2: 83% on 5L, HR: 150.  Once sitting down again, increased O2 to 6L with nursing  approval. O2 sat levels increased back up to high 80s. Supine exercises: O2: 85% on 6L, increased to high 80s before leaving room    Follow Up Recommendations  CIR     Equipment Recommendations  Other (comment)    Recommendations for Other Services       Precautions / Restrictions Precautions Precautions: Fall Precaution Comments: flaccid LUE (protect from subluxation) Required Braces or Orthoses: Sling (when OOB) Restrictions Weight Bearing Restrictions: No    Mobility  Bed Mobility Overal bed mobility: Needs Assistance Bed Mobility: Supine to Sit;Sit to Supine;Rolling Rolling: Mod assist   Supine to sit: Mod assist;+2 for physical assistance;HOB elevated;Min assist Sit to supine: Total assist;+2 for physical assistance;+2 for safety/equipment   General bed mobility comments: During supine to sit, patient is able to bring BLE off bed with minimal assistance but needed modA+2 for trunk support. VCs required throughout for proper technique. During sit to supine, patient is totalA+2 as he needs assistance with trunk and LEs. Once supine in bed, pt requires modA+2 for scooting up in bed as he uses his LE to push up. Pt is also modA+1 rolling bilateral sides to help straighten up bedsheets underneath him  Transfers Overall transfer level: Needs assistance Equipment used: 2 person hand held assist Transfers: Sit to/from Stand Sit to Stand: Max assist;+2 physical assistance;From elevated surface         General transfer comment: Pt able to stand with mod to maxA+2 with bilateral tibial knee blocking into TKE for stance. Pt weight shifts to his R  requiring maxA on the right, and only modA on the left. Pt is unable to maintain >5 seconds: HR incr >150 bpm  Ambulation/Gait             General Gait Details: Did not assess due to safety   Stairs             Wheelchair Mobility    Modified Rankin (Stroke Patients Only)       Balance Overall balance  assessment: Needs assistance Sitting-balance support: Feet supported Sitting balance-Leahy Scale: Good Sitting balance - Comments: Pt able to maintain balance once feet are supported, and demonstrates good balance while reaching within BOS. Posterior lean and R lateral lean demonstrated  Postural control: Posterior lean;Right lateral lean Standing balance support: Bilateral upper extremity supported;During functional activity Standing balance-Leahy Scale: Zero Standing balance comment: Pt able to stand with mod/maxA+2. VCs utilized to stand up straight, however patient unable to achieve fully                            Cognition Arousal/Alertness: Awake/alert Behavior During Therapy: [facility] for tasks assessed/performed;Flat affect Overall Cognitive Status: Within Functional Limits for tasks assessed                                 General Comments: Pt able to communicate but has flat affect throughout session      Exercises General Exercises - Lower Extremity Heel Slides: AAROM;Both;10 reps;Supine Hip ABduction/ADduction: AAROM;Both;10 reps;Supine Straight Leg Raises: AAROM;Both;10 reps;Supine Other Exercises Other Exercises: Pt performed rolling in both directions to straighten up bed linens    General Comments General comments (skin integrity, edema, etc.): Monitored vitals throughout session: Resting in bed: O2: 92 5L, HR: 127; Sitting EOB: O2: 83 which increased to 88 after a few minutes; Standing: O2: 83% on 5L, HR: 150. Once back sitting, increased O2 to 6L with nursing approval. O2 sat levels increased back up to high 80s. Supine exercises: O2: 85% on 6L, increased to high 80s before leaving room      Pertinent Vitals/Pain Pain Assessment: No/denies pain    Home Living                      Prior Function            PT Goals (current goals can now be found in the care plan section) Acute Rehab PT Goals Patient Stated Goal: Pt wants to  return home and back to work when ready PT Goal Formulation: With patient Time For Goal Achievement: 06/18/20 Potential to Achieve Goals: Fair Progress towards PT goals: Progressing toward goals    Frequency    Min 2X/week      PT Plan Current plan remains appropriate    Co-evaluation PT/OT/SLP Co-Evaluation/Treatment: Yes Reason for Co-Treatment: Complexity of the patient's impairments (multi-system involvement) PT goals addressed during session: Mobility/safety with mobility;Balance;Strengthening/ROM        AM-PAC PT "6 Clicks" Mobility   Outcome Measure  Help needed turning from your back to your side while in a flat bed without using bedrails?: Total Help needed moving from lying on your back to sitting on the side of a flat bed without using bedrails?: Total Help needed moving to and from a bed to a chair (including a wheelchair)?: Total Help needed standing up from a chair using your arms (e.g., wheelchair or bedside chair)?: Total  Help needed to walk in hospital room?: Total Help needed climbing 3-5 steps with a railing? : Total 6 Click Score: 6    End of Session Equipment Utilized During Treatment: Gait belt;Oxygen Activity Tolerance: Patient limited by fatigue Patient left: in bed;with call bell/phone within reach;with bed alarm set Nurse Communication: Mobility status PT Visit Diagnosis: Difficulty in walking, not elsewhere classified (R26.2);Other abnormalities of gait and mobility (R26.89);Muscle weakness (generalized) (M62.81)     Time: 9528-4132 PT Time Calculation (min) (ACUTE ONLY): 44 min  Charges:                         Katherine Basset, SPT Baker Pierini 06/04/2020, 4:29 PM

## 2020-06-04 NOTE — Plan of Care (Signed)
  Problem: Clinical Measurements: Goal: Ability to maintain clinical measurements within normal limits will improve Outcome: Not Progressing Patient HR ST in 110's 120's for most of shift, MD aware, metoprolol ordered and given   Problem: Pain Managment: Goal: General experience of comfort will improve Outcome: Progressing  Problem: Safety: Goal: Ability to remain free from injury will improve Outcome: Progressing

## 2020-06-04 NOTE — Progress Notes (Addendum)
Inpatient Diabetes Program Recommendations  AACE/ADA: New Consensus Statement on Inpatient Glycemic Control (2015)  Target Ranges:  Prepandial:   less than 140 mg/dL      Peak postprandial:   less than 180 mg/dL (1-2 hours)      Critically ill patients:  140 - 180 mg/dL   Lab Results  Component Value Date   GLUCAP 294 (H) 06/04/2020   HGBA1C 7.2 (H) 05/03/2020    Review of Glycemic Control Results for [name], [name] (MRN [id number]) as of 06/04/2020 09:42  Ref. Range 06/03/2020 16:46 06/03/2020 19:49 06/03/2020 23:25 06/04/2020 07:40  Glucose-Capillary Latest Ref Range: 70 - 99 mg/dL 782 (H) 956 (H) 213 (H) 294 (H)   Diabetes history: DM 2-New diagnosis Outpatient Diabetes medications:  None Current orders for Inpatient glycemic control:  Novolog resistant tid with meals and HS Levemir 20 units bid Solumedrol 1000 mg daily (last dose 06/08/20  Inpatient Diabetes Program Recommendations:    While on high dose of Solumedrol, please increase Levemir to 30 units bid and add Novolog meal coverage 4 units tid with meals.  When solumedrol stopped, will need to reduce insulin.  Based on admission A1C, patient may be able to d/c on PO meds for DM?  Will follow and make sure that DM education completed with patient.   Thanks,  Beryl Meager, RN, BC-ADM Inpatient Diabetes Coordinator Pager [phone] (8a-5p)  Addendum 1300:  Spoke with patient regarding new diagnosis of DM and A1C of 7.2%.  Patient was not aware that he had elevated blood sugars prior to admission.  Briefly discussed normal blood sugar values, DM, and importance of long term control of DM.  Based on A1C, it appears that patient will be able to go home with oral DM medications, however he will also need to monitor blood sugars closely. Discussed need for meter and the importance of checking CBG's 1-2 times a day.  We discussed normal blood sugar values as well.  He states that he does not have a PCP and will need MD for  outpatient as well.  Will ask RN to review how to check a blood sugar with patient.  Also ordered Living Well with Diabetes booklet for patient.  He states that he only drinks water and sugar free beverages.  Placed order for dietician consult as well for further education regarding diet.  Patient appreciative of information.  Will follow.

## 2020-06-04 NOTE — TOC Progression Note (Signed)
Transition of Care [facility]) - Progression Note    Patient Details  Name: Collin Brown MRN: [id number] Date of Birth: 1985/09/10  [facility] CM/SW Contact  Weweantic Cellar, RN Phone Number: 06/04/2020, 11:40 AM  Clinical Narrative:    Reviewed for possible placement needs and patient not medically stable for discharge. Appears current dcp is for CIR once medically stable.    Expected Discharge Plan: Skilled Nursing Facility Barriers to Discharge: Continued Medical Work up  Expected Discharge Plan and Services Expected Discharge Plan: Skilled Nursing Facility In-house Referral: Clinical Social Work   Post Acute Care Choice: Skilled Nursing Facility Living arrangements for the past 2 months: Single Family Home                                       Social Determinants of Health (SDOH) Interventions    Readmission Risk Interventions No flowsheet data found.

## 2020-06-05 DIAGNOSIS — K5900 Constipation, unspecified: Secondary | ICD-10-CM | POA: Diagnosis not present

## 2020-06-05 DIAGNOSIS — G4733 Obstructive sleep apnea (adult) (pediatric): Secondary | ICD-10-CM | POA: Diagnosis not present

## 2020-06-05 DIAGNOSIS — J9601 Acute respiratory failure with hypoxia: Secondary | ICD-10-CM | POA: Diagnosis not present

## 2020-06-05 DIAGNOSIS — U071 COVID-19: Secondary | ICD-10-CM | POA: Diagnosis not present

## 2020-06-05 LAB — GLUCOSE, CAPILLARY
Glucose-Capillary: 151 mg/dL — ABNORMAL HIGH (ref 70–99)
Glucose-Capillary: 224 mg/dL — ABNORMAL HIGH (ref 70–99)
Glucose-Capillary: 285 mg/dL — ABNORMAL HIGH (ref 70–99)
Glucose-Capillary: 300 mg/dL — ABNORMAL HIGH (ref 70–99)

## 2020-06-05 LAB — CBC WITH DIFFERENTIAL/PLATELET
Abs Immature Granulocytes: 0.1 10*3/uL — ABNORMAL HIGH (ref 0.00–0.07)
Basophils Absolute: 0 10*3/uL (ref 0.0–0.1)
Basophils Relative: 0 %
Eosinophils Absolute: 0 10*3/uL (ref 0.0–0.5)
Eosinophils Relative: 0 %
HCT: 35.8 % — ABNORMAL LOW (ref 39.0–52.0)
Hemoglobin: 11.6 g/dL — ABNORMAL LOW (ref 13.0–17.0)
Immature Granulocytes: 1 %
Lymphocytes Relative: 9 %
Lymphs Abs: 1.1 10*3/uL (ref 0.7–4.0)
MCH: 28.4 pg (ref 26.0–34.0)
MCHC: 32.4 g/dL (ref 30.0–36.0)
MCV: 87.7 fL (ref 80.0–100.0)
Monocytes Absolute: 0.9 10*3/uL (ref 0.1–1.0)
Monocytes Relative: 7 %
Neutro Abs: 10.5 10*3/uL — ABNORMAL HIGH (ref 1.7–7.7)
Neutrophils Relative %: 83 %
Platelets: 160 10*3/uL (ref 150–400)
RBC: 4.08 MIL/uL — ABNORMAL LOW (ref 4.22–5.81)
RDW: 17.7 % — ABNORMAL HIGH (ref 11.5–15.5)
WBC: 12.6 10*3/uL — ABNORMAL HIGH (ref 4.0–10.5)
nRBC: 0 % (ref 0.0–0.2)

## 2020-06-05 LAB — BASIC METABOLIC PANEL
Anion gap: 9 (ref 5–15)
BUN: 26 mg/dL — ABNORMAL HIGH (ref 6–20)
CO2: 29 mmol/L (ref 22–32)
Calcium: 9.2 mg/dL (ref 8.9–10.3)
Chloride: 96 mmol/L — ABNORMAL LOW (ref 98–111)
Creatinine, Ser: 0.45 mg/dL — ABNORMAL LOW (ref 0.61–1.24)
GFR, Estimated: >60 mL/min (ref >60)
Glucose, Bld: 193 mg/dL — ABNORMAL HIGH (ref 70–99)
Potassium: 3.6 mmol/L (ref 3.5–5.1)
Sodium: 134 mmol/L — ABNORMAL LOW (ref 135–145)

## 2020-06-05 MED ORDER — BACITRACIN-NEOMYCIN-POLYMYXIN OINTMENT TUBE
TOPICAL_OINTMENT | Freq: Every day | CUTANEOUS | Status: DC
  Filled 2020-06-05: qty 14.17

## 2020-06-05 MED ORDER — DOUBLE ANTIBIOTIC 500-10000 UNIT/GM EX OINT
1.0000 "application " | TOPICAL_OINTMENT | Freq: Every day | CUTANEOUS | Status: DC
  Filled 2020-06-05: qty 28.4

## 2020-06-05 MED ORDER — COLLAGENASE 250 UNIT/GM EX OINT
TOPICAL_OINTMENT | Freq: Every day | CUTANEOUS | Status: DC
  Filled 2020-06-05: qty 30

## 2020-06-05 NOTE — Progress Notes (Signed)
Physical Therapy Treatment Patient Details Name: Collin Brown MRN: [id number] DOB: [birth year] Today's Date: 06/05/2020    History of Present Illness Collin Brown is a 34 year old male who comes to [facility] on 9/23 after progression of dyspnea. Pt recently (+)COVID19 along with other family. Pt required intubation on 9/25, self extubated on 10/10. At evaluation pt presenting on HHHF @55L /min, 90%FiO2.    PT Comments    Pt presents supine in bed on arrival to room and agreeable to PT session. Pt continues to be modA+2 for supine to sit with assistance for trunk support. VCs required throughout for proper technique. Once sitting EOB patient attempted scooting towards HOB while sitting on the EOB. However, unable to lift himself up with just RUE. Pt perform 3 sit<>stands from elevated surface. The first attempt, pt was maxA+2 with bilateral knee blocking and only able to stand up for about 2 seconds before sitting down. The second time, patient was modA+2 with bilateral knee blocking as he used the counter as support RUE. He attempted to stand up straight but unable to fully achieve and fatigued after 5 seconds. The third time with verbal cueing, pt is able to shift his hips forward to stand upright as he hooked his RUE on the sink for support. Pt stands for about 15 seconds before fatiguing. Pt performs sitting exercises to help with lower and upper extremity movement and core strength. During sit to supine, patient is maxA+2 as he needs assistance with trunk and LEs, as well as minA+1 for support of LUE. Once supine in bed, pt requires modA+2 for scooting up in bed as he uses his BLE and RUE to help pull himself up. He is left in bed with all needs. Monitored vitals throughout session and listed below. Pt continues to benefit from skilled PT services to maximize return to PLOF and minimize risk of future falls, injury, caregiver burden, and readmission. Will continue to follow POC. Discharge recommendation  remains appropriate.  Monitored vitals throughout session: Resting in bed: O2: 95% 5L, HR: 118 Sitting EOB: O2: 87% 5L, HR: 132 Standing: 85-86% 5L, Increased O2 to 6L 89%, HR: 122-137. Supine in bed: O2: 92% 5L, HR: 117    Follow Up Recommendations  CIR     Equipment Recommendations  Other (comment) (defer to next setting)    Recommendations for Other Services       Precautions / Restrictions Precautions Precautions: Fall Precaution Comments: flaccid LUE (protect from subluxation) Required Braces or Orthoses: Sling (when EOB or OOB) Restrictions Weight Bearing Restrictions: No    Mobility  Bed Mobility Overal bed mobility: Needs Assistance Bed Mobility: Supine to Sit;Sit to Supine     Supine to sit: Mod assist;+2 for physical assistance;HOB elevated Sit to supine: Max assist;+2 for physical assistance;HOB elevated;+2 for safety/equipment   General bed mobility comments: During supine to sit, patient is able to bring BLE off bed with minimal assistance but needed modA+2 for trunk support. VCs required throughout for proper technique. During sit to supine, patient is maxA+2 as he needs assistance with trunk and LEs, and minA+1 for support of LUE. Once supine in bed, pt requires modA+2 for scooting up in bed as he uses his LE and RUE to help pull himself up.  Transfers Overall transfer level: Needs assistance Equipment used: 2 person hand held assist Transfers: Sit to/from Stand Sit to Stand: Mod assist;+2 physical assistance;From elevated surface;Max assist         General transfer comment: Pt perform  3 sit<>stands. The first attempt, pt was maxA+2 and only able to stand up about 2 seconds before needing to sit down. The second, patient was modA+2 and used the counter as support for RUE. Pt able to stand for 5 seconds before needing to sit down. During his last transfer, pt able to stand up again with modA+2 as he used the counter for support. He is able to shift his  hips forward to stand upright. Pt able to stand for 15 seconds before fatiguing and needing to sit down  Ambulation/Gait             General Gait Details: Did not assess due to safety   Stairs             Wheelchair Mobility    Modified Rankin (Stroke Patients Only)       Balance Overall balance assessment: Needs assistance Sitting-balance support: Feet supported Sitting balance-Leahy Scale: Good Sitting balance - Comments: Pt able to maintain balance once feet are supported, and demonstrates good balance while with external pertubations   Standing balance support: Single extremity supported;During functional activity Standing balance-Leahy Scale: Poor Standing balance comment: Pt able to stand with modA+2. 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 However, unable to lift himself up and over with RUE.    General Comments General comments (skin integrity, edema, etc.): Monitored vitals throughout session: Resting in bed: O2: 95% 5L, HR: 118. Sitting EOB: O2: 87% 5L, HR: 132; Standing: 85-86% 5L, Increased O2 to 6L 89%, HR: 122-137. Supine in bed: O2: 92% 5L, HR: 117      Pertinent Vitals/Pain Pain Assessment: No/denies pain    Home Living                       Prior Function            PT Goals (current goals can now be found in the care plan section) Acute Rehab PT Goals Patient Stated Goal: Pt wants to return home and back to work when ready PT Goal Formulation: With patient Time For Goal Achievement: 06/18/20 Potential to Achieve Goals: Fair Progress towards PT goals: Progressing toward goals    Frequency    Min 2X/week      PT Plan Current plan remains appropriate    Co-evaluation              AM-PAC PT "6 Clicks" Mobility   Outcome Measure  Help needed turning from your back to your side while in a flat bed without using bedrails?: Total Help needed moving from lying on your back to sitting on the side of a flat bed without  using bedrails?: Total Help needed moving to and from a bed to a chair (including a wheelchair)?: Total Help needed standing up from a chair using your arms (e.g., wheelchair or bedside chair)?: Total Help needed to walk in hospital room?: Total Help needed climbing 3-5 steps with a railing? : Total 6 Click Score: 6    End of Session Equipment Utilized During Treatment: Gait belt;Oxygen Activity Tolerance: Patient limited by fatigue Patient left: in bed;with call bell/phone within reach;with bed alarm set Nurse Communication: Mobility status PT Visit Diagnosis: Difficulty in walking, not elsewhere classified (R26.2);Other abnormalities of gait and mobility (R26.89);Muscle weakness (generalized) (M62.81)     Time: 0037-0488 PT Time Calculation (min) (ACUTE ONLY): 38 min  Charges:                        Katherine Basset, SPT Baker Pierini 06/05/2020, 1:42 PM

## 2020-06-05 NOTE — Consult Note (Signed)
I have placed a request via Secure Chat to Dr. Tyson Babinski  requesting photos of the wound areas of concern to be placed in the EMR.   Collin Brown [facility], CNS, [employer] [phone]

## 2020-06-05 NOTE — Progress Notes (Signed)
PROGRESS NOTE  JSON KOELZER FBP:[id number] DOB: 05-Jun-1986 DOA: 05/03/2020 PCP: Patient, No Pcp Per   LOS: 33 days   Brief narrative: As per HPI,  Collin Brown is a 34 y.o. male with medical history significant for obstructive sleep apnea, morbid obesity, presented to the hospital on May 03, 2020 with cough, shortness of breath, generalized weakness, fever and a positive Covid 19 test. He was admitted to the medical floor for acute hypoxemic respiratory failure secondary to COVID-19 pneumonia. He was treated with IV remdesivir and IV steroids. Baricitinib was not given because of leukopenia. His condition deteriorated and hypoxemia worsened. He was transferred to the ICU on May 05, 2020 where he was initially treated with BiPAP. However, he failed BiPAP therapy and so he was eventually intubated and placed on mechanical ventilation on May 05, 2020. He self extubated on May 20, 2020 was transitioned to BiPAP. Hospital course was complicated by acute metabolic encephalopathy and ARDS. His condition slowly improved and was transitioned to oxygenation via high flow nasal cannula.  He was then transferred to the floor for further management.    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   MRI of the brain showed chronic microhemorrhages, C-spine MRI showed minor degenerative change at C4-C5 but no abnormal cord signal.  CT scan of the chest with contrast to shoulder no obvious changes in the brachial plexus.  Neurology on board and patient has been started on high-dose IV steroids for possible brachial plexopathy.  Patient states that he has been able to move his shoulders little better on the left side and was able to move his fingers better.  Slight improvement in his  motor power.  Acute hypoxic respiratory failure secondary to COVID-19 pneumonia with ARDS:  Received treatment for pneumonia with IV steroids. Patient self extubated and has been on BiPAP at nighttime and nasal cannula during the daytime.  Currently on 5 L of oxygen , will continue to wean as possible.  Continue daily weaning efforts.   Right  mild ulnar neuropathy, on IV steroids.  Type II DM: Hemoglobin A1c was 7.2.  Continue Levemir and Humalog sliding scale insulin.  Dose of insulin has been adjusted secondary to high-dose IV steroids.  Latest POC glucose of 151.  With lipid panel noted with elevated total cholesterol LDL was 153.  We will start the patient on moderate intensity statin prior to discharge once his muscle weakness improves.    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  Nasal cannula during the daytime.  Continue to wean as able.  Extreme debility, deconditioning.  Continue PT, OT.  Physical therapy  recommended CIR.  Transition of care on board for placement.  Severe constipation.  Improved.  Continue senna, MiraLAX.  Will need to take daily.  He recently needed enema for constipation.  Pressure ulceration. Present on admission. Continue Wound care.   Pressure Injury 05/12/20 Toe (Comment  which one) Anterior;Right Stage 2 -  Partial thickness loss of dermis presenting as a shallow open injury with a red, pink wound bed without slough. (Active)  05/12/20 1930  Location: Toe (Comment  which one)  Location Orientation: Anterior;Right  Staging: Stage 2 -  Partial thickness loss of dermis presenting as a shallow open injury  with a red, pink wound bed without slough.  Wound Description (Comments):   Present on Admission: No     Pressure Injury 05/12/20 Toe (Comment  which one) Anterior;Left Stage 2 -  Partial thickness loss of dermis presenting as a shallow open injury with a red, pink wound bed without slough.  (Active)  05/12/20 1930  Location: Toe (Comment  which one)  Location Orientation: Anterior;Left  Staging: Stage 2 -  Partial thickness loss of dermis presenting as a shallow open injury with a red, pink wound bed without slough.  Wound Description (Comments):   Present on Admission: No     Pressure Injury 05/21/20 Buttocks Right;Left purple/white (Active)  05/21/20 2123  Location: Buttocks  Location Orientation: Right;Left  Staging:   Wound Description (Comments): purple/white  Present on Admission:     DVT prophylaxis: : Subcu Lovenox  Code Status: Full code  Family Communication: None today.  Spoke with patient's mother at bedside yesterday.  Filled work required forms for the patient.   Status is: Inpatient  Remains inpatient appropriate because: Severe left upper extremity flaccid paralysis , need for ongoing rehab  Dispo: The patient is from: Home              Anticipated d/c is to: SNF /CIR              Anticipated d/c date is: Undetermined at this time.              Patient currently is medically stable to d/c if going to inpatient rehab where he can continue IV Solu-Medrol...  Will likely need long-term oxygen and BiPAP at nighttime.  Currently undergoing high-dose IV steroids. Will continue to wean oxygen as able.    Consultants:   Critical care  Neurology  Procedures:  Intubation mechanical ventilation on May 05, 2020  Self extubated on May 20, 2020  BiPAP placement  Antibiotics:  . None at this time  Subjective: Today, patient was seen and examined at bedside.  States that there is an improvement in mobility of his left shoulder.  Is been able to flicker his hand better.   Objective: Vitals:   06/05/20 0410 06/05/20 0829  BP: 105/74 (!) 159/92  Pulse: (!) 102 (!) 104  Resp: 16 16  Temp: 97.6 F (36.4 C) 97.8 F (36.6 C)  SpO2: 97% 98%    Intake/Output Summary (Last 24 hours) at 06/05/2020 0848 Last data filed at 06/04/2020 2100 Gross per  24 hour  Intake 143.82 ml  Output 450 ml  Net -306.18 ml   Filed Weights   06/03/20 0337 06/04/20 0419 06/05/20 0500  Weight: (!) 164.7 kg (!) 163.4 kg (!) 164.7 kg   Body mass index is 52.1 kg/m.   Physical Exam:  General: Morbidly obese, not in obvious distress, on nasal cannula oxygen at this time.  HENT:   No scleral pallor or icterus noted. Oral mucosa is moist.  Dilated pupils but reactive bilaterally. Chest:   Diminished breath sounds bilaterally.  Unable to hear any wheezes. CVS: S1 &S2 heard. No murmur.  Regular rate and rhythm. Abdomen: Soft, nontender, obese abdomen, nondistended.  Bowel sounds are heard.   Extremities: No cyanosis, clubbing or edema.  Peripheral pulses are palpable.  Left upper extremity flaccid weakness with some paresthesia but able to move his shoulder better today..  Right fourth and fifth finger area with numbness.  Improved mobility of the left upper extremity-power 1-2/5 Psych: Alert, awake and communicative, oriented normal mood CNS:  No cranial nerve deficits.  Power equal in all extremities.   Skin: Warm and dry.     Data Review: I have personally reviewed the following laboratory data and studies,  CBC: Recent Labs  Lab 05/30/20 0427 05/31/20 0603 06/04/20 0718 06/05/20 0450  WBC 5.8 5.6 6.8 12.6*  NEUTROABS 3.7  --  6.2 10.5*  HGB 11.4* 10.9* 12.8* 11.6*  HCT 36.0* 33.7* 39.8 35.8*  MCV 89.3 89.4 87.5 87.7  PLT 221 172 147* 160   Basic Metabolic Panel: Recent Labs  Lab 05/30/20 0427 05/31/20 0603 06/02/20 0415 06/04/20 0718 06/05/20 0450  NA  --  138 137 135 134*  K  --  3.3* 3.7 4.2 3.6  CL  --  96* 96* 96* 96*  CO2  --  34* 32 28 29  GLUCOSE  --  107* 117* 256* 193*  BUN  --  17 12 18  26*  CREATININE  --  0.41* 0.46* 0.63 0.45*  CALCIUM  --  8.8* 8.9 9.6 9.2  MG 2.1  --   --   --   --   PHOS 4.5  --   --   --   --    Liver Function Tests: Recent Labs  Lab 05/31/20 0603  AST 23  ALT 72*  ALKPHOS 63  BILITOT  0.7  PROT 6.0*  ALBUMIN 3.1*   No results for input(s): LIPASE, AMYLASE in the last 168 hours. No results for input(s): AMMONIA in the last 168 hours. Cardiac Enzymes: Recent Labs  Lab 06/03/20 0905  CKTOTAL 29*   BNP (last 3 results) Recent Labs    05/03/20 1633 05/16/20 1435  BNP 31.1 77.7    ProBNP (last 3 results) No results for input(s): PROBNP in the last 8760 hours.  CBG: Recent Labs  Lab 06/04/20 1214 06/04/20 1725 06/04/20 2046 06/04/20 2355 06/05/20 0829  GLUCAP 267* 204* 332* 257* 151*   No results found for this or any previous visit (from the past 240 hour(s)).   Studies: No results found.    06/07/20, MD  [facility] 06/05/2020

## 2020-06-05 NOTE — Consult Note (Signed)
WOC Nurse Consult Note: Reason for Consult: sacrum and toes Patient admitted with SOB; found to have COVID, intubated, self extubated, now off precautions but still very weak. Morbid obesity  Wound type:  1. Full thickness (appears chronic in nature) just above the gluteal cleft. Deep area with ulceration in the base. Patient denies chronic wound; denies drainage; denies surgery or possible pilonidal cyst 2. 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 Medical device related unstageable pressure injury noted upon inspection of skin; right mandible; patient has been using BiPAP since self extubation    Pressure Injury POA: No Measurement: 1. 1.5cm x 0.3cm x 0.5cm; 100% yellow/grey slough 2. See nursing flowsheets 3. 1.5cm x 2.5cm x 0cm : 100% eschar Wound bed: see above  Drainage (amount, consistency, odor) none from the mandible wound; toes are intact, yellow drainage-minimal amount from coccyx wound with no odor Periwound:intact  Dressing procedure/placement/frequency: Enzymatic debridement to the coccyx wound daily, small saline moist gauze. Bacitracin to the mandible wound daily.  No topical care needed to the areas on the toes.   WOC Nurse team will follow with you and see patient within 10 days for wound assessments.  Please notify WOC nurses of any acute changes in the wounds or any new areas of concern Nima Bamburg [facility] MSN, RN,CWOCN, CNS, CWON-AP [phone]

## 2020-06-05 NOTE — Progress Notes (Addendum)
Nutrition Follow-up  DOCUMENTATION CODES:   Morbid obesity  INTERVENTION:   Nepro Shake po TID, each supplement provides 425 kcal and 19 grams protein  Double protein with meals  MVI daily   Vitamin C 548m po daily   NUTRITION DIAGNOSIS:   Inadequate oral intake related to inability to eat as evidenced by NPO status. - resolving   GOAL:   Patient will meet greater than or equal to 90% of their needs - progressing   MONITOR:   PO intake, Supplement acceptance, Labs, Weight trends, Skin, I & O's  ASSESSMENT:   34year old male with PMHx of OSA admitted with COVID-19 PNA.   Met with pt in room today. Pt reports improved appetite and oral intake in hospital; pt documented to be eating anywhere from 40-100% of meals. Pt reports eating 95% of his lunch today which included pot roast, mashed potatoes, angel food cake, peaches and broccoli. Pt reports that he is drinking 1-2 Nepro supplements per day (likes mixed berry). Constipation is resolved. Pt is working with PT. Per chart, pt down ~29lbs since admit but pt appears to be ~53lbs above his UBW. It is difficult to determine if any significant weight changes as pt's bed weights have significantly fluctuated. Recommend continue supplements after discharge. RD will add double protein to meal trays. Plan is for CIR at discharge. RD provided pt with DM diet education at bedside today.   RD provided "Nutrition and Type II Diabetes" handout from the Academy of [employer]. Discussed different food groups and their effects on blood sugar, emphasizing carbohydrate-containing foods. Provided list of carbohydrates and recommended serving sizes of common foods.  Discussed importance of controlled and consistent carbohydrate intake throughout the day. Provided examples of ways to balance meals/snacks and encouraged intake of high-fiber, whole grain complex carbohydrates. Teach back method used.  Medications reviewed and include:  vitamin C, aspirin, lovenox, insulin, MVI, miralax, senokot, solu-medrol  Labs reviewed: Na 134(L), BUN 26(H), creat 0.45(L) Wbc- 12.6(H) cbgs- 151, 224 x 24 hrs  Diet Order:   Diet Order            DIET DYS 3 Room service appropriate? Yes with Assist; Fluid consistency: Thin  Diet effective now                EDUCATION NEEDS:   No education needs have been identified at this time  Skin:  Skin Assessment: Reviewed RN Assessment (gluteal area 1.5cm x 0.3cm x 0.5cm, toes 1.5cm x 2.5cm x 0cm)  Last BM:  10/26- type 6  Height:   Ht Readings from Last 1 Encounters:  05/12/20 _0  (1.778 m)   Weight:   Wt Readings from Last 1 Encounters:  06/05/20 (!) 164.7 kg   Ideal Body Weight:  75.5 kg  BMI:  Body mass index is 52.1 kg/m.  Estimated Nutritional Needs:   Kcal:  3200-3500kcal/day  Protein:  >160g/day  Fluid:  2.3-2.6L/day  CKoleen DistanceMS, RD, LDN Please refer to [facility] Pcfor RD and/or RD on-call/weekend/after hours pager

## 2020-06-06 DIAGNOSIS — Z6833 Body mass index (BMI) 33.0-33.9, adult: Secondary | ICD-10-CM

## 2020-06-06 DIAGNOSIS — R29898 Other symptoms and signs involving the musculoskeletal system: Secondary | ICD-10-CM

## 2020-06-06 DIAGNOSIS — E669 Obesity, unspecified: Secondary | ICD-10-CM | POA: Diagnosis not present

## 2020-06-06 LAB — CBC WITH DIFFERENTIAL/PLATELET
Abs Immature Granulocytes: 0.11 10*3/uL — ABNORMAL HIGH (ref 0.00–0.07)
Basophils Absolute: 0 10*3/uL (ref 0.0–0.1)
Basophils Relative: 0 %
Eosinophils Absolute: 0 10*3/uL (ref 0.0–0.5)
Eosinophils Relative: 0 %
HCT: 38.1 % — ABNORMAL LOW (ref 39.0–52.0)
Hemoglobin: 12 g/dL — ABNORMAL LOW (ref 13.0–17.0)
Immature Granulocytes: 1 %
Lymphocytes Relative: 12 %
Lymphs Abs: 1.4 10*3/uL (ref 0.7–4.0)
MCH: 28.4 pg (ref 26.0–34.0)
MCHC: 31.5 g/dL (ref 30.0–36.0)
MCV: 90.3 fL (ref 80.0–100.0)
Monocytes Absolute: 1 10*3/uL (ref 0.1–1.0)
Monocytes Relative: 8 %
Neutro Abs: 9.1 10*3/uL — ABNORMAL HIGH (ref 1.7–7.7)
Neutrophils Relative %: 79 %
Platelets: 143 10*3/uL — ABNORMAL LOW (ref 150–400)
RBC: 4.22 MIL/uL (ref 4.22–5.81)
RDW: 18.2 % — ABNORMAL HIGH (ref 11.5–15.5)
WBC: 11.6 10*3/uL — ABNORMAL HIGH (ref 4.0–10.5)
nRBC: 0 % (ref 0.0–0.2)

## 2020-06-06 LAB — BASIC METABOLIC PANEL
Anion gap: 10 (ref 5–15)
BUN: 24 mg/dL — ABNORMAL HIGH (ref 6–20)
CO2: 30 mmol/L (ref 22–32)
Calcium: 9.6 mg/dL (ref 8.9–10.3)
Chloride: 98 mmol/L (ref 98–111)
Creatinine, Ser: 0.35 mg/dL — ABNORMAL LOW (ref 0.61–1.24)
GFR, Estimated: >60 mL/min (ref >60)
Glucose, Bld: 125 mg/dL — ABNORMAL HIGH (ref 70–99)
Potassium: 4.3 mmol/L (ref 3.5–5.1)
Sodium: 138 mmol/L (ref 135–145)

## 2020-06-06 LAB — GLUCOSE, CAPILLARY
Glucose-Capillary: 241 mg/dL — ABNORMAL HIGH (ref 70–99)
Glucose-Capillary: 130 mg/dL — ABNORMAL HIGH (ref 70–99)
Glucose-Capillary: 143 mg/dL — ABNORMAL HIGH (ref 70–99)
Glucose-Capillary: 258 mg/dL — ABNORMAL HIGH (ref 70–99)
Glucose-Capillary: 272 mg/dL — ABNORMAL HIGH (ref 70–99)

## 2020-06-06 MED ORDER — ACETAMINOPHEN 500 MG PO TABS
1000.0000 mg | ORAL_TABLET | Freq: Four times a day (QID) | ORAL | Status: DC | PRN
  Administered 2020-06-06 – 2020-06-07 (×2): 1000 mg via ORAL
  Filled 2020-06-06 (×2): qty 2

## 2020-06-06 NOTE — Progress Notes (Signed)
PROGRESS NOTE  Collin Brown:[id number] DOB: 12-09-1985 DOA: 05/03/2020 PCP: Patient, No Pcp Per   LOS: 34 days   Brief narrative: As per HPI,  Collin Brown is a 34 y.o. male with medical history significant for obstructive sleep apnea, morbid obesity, presented to the hospital on May 03, 2020 with cough, shortness of breath, generalized weakness, fever and a positive Covid 19 test. He was admitted to the medical floor for acute hypoxemic respiratory failure secondary to COVID-19 pneumonia. He was treated with IV remdesivir and IV steroids. Baricitinib was not given because of leukopenia. His condition deteriorated and hypoxemia worsened. He was transferred to the ICU on May 05, 2020 where he was initially treated with BiPAP. However, he failed BiPAP therapy and so he was eventually intubated and placed on mechanical ventilation on May 05, 2020. He self extubated on May 20, 2020 was transitioned to BiPAP. Hospital course was complicated by acute metabolic encephalopathy and ARDS. His condition slowly improved and was transitioned to oxygenation via high flow nasal cannula.  He was then transferred to the floor for further management.    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    MRI of the brain showed chronic microhemorrhages, C-spine MRI showed minor degenerative change at C4-C5 but no abnormal cord signal.  CT scan of the chest with contrast to shoulder no obvious changes in the brachial plexus.   Neurology was consulted.  Patient started on 5-day course of high-dose IV steroids for possible brachial plexopathy.  This was initiated on 10/24.  Today is day 4.   Patient has not noticed much improvement but he is able to move his fingers and his shoulder.  Continue with OT and PT.    Acute hypoxic respiratory failure secondary to COVID-19 pneumonia with ARDS:  Received treatment for pneumonia with IV steroids. Patient self  extubated and has been on BiPAP at nighttime and nasal cannula during the daytime.   Stable for the most part.  He is currently on 2 L of oxygen saturating in the early 90s.  Continue to wean down as tolerated.  Incentive spirometry.  Mobilization.  He is now off of isolation.    Right mild ulnar neuropathy See discussion above.  On IV steroids.  Diabetes mellitus type 2 Hemoglobin A1c was 7.2.   This appears to be a new diagnosis for this patient.  No glucose lowering drugs noted on his home medication list.  Likely due to steroids.  Currently on Levemir and sliding scale coverage.  CBGs are reasonably well controlled.  Anticipate CBGs will drop precipitously when steroid course completes tomorrow.  Will need to decrease the dose of his Levemir at that time.  LDL 153.  Patient to be started on statin at discharge.     Obstructive sleep apnea/chronic hypercapnic respiratory failure/probable OHS Continue BiPAP at night .  Nasal cannula during the daytime.  Continue to wean as able.  Extreme debility, deconditioning Continue PT, OT.  Physical therapy  recommended CIR.  Transition of care on board for placement.  Severe constipation Improved.  Continue senna, MiraLAX.    Pressure ulceration Present on admission. Continue Wound care.  Pressure Injury 05/12/20 Toe (Comment  which one) Anterior;Right Stage 2 -  Partial thickness loss of dermis presenting as a shallow open injury with a red, pink wound bed without slough. THESE ARE NOT PRESSURE INJURIES; COVID TOES; RELATED TO VASCULOPAT (Active)  05/12/20 1930  Location: Toe (Comment  which one)  Location Orientation: Anterior;Right  Staging: Stage 2 -  Partial thickness loss of dermis presenting as a shallow open injury with a red, pink wound bed without slough.  Wound Description (Comments): THESE ARE NOT PRESSURE INJURIES; COVID TOES; RELATED TO VASCULOPATHY [id number]  Present on Admission: No     Pressure Injury 05/12/20 Toe (Comment  which  one) Anterior;Left Stage 2 -  Partial thickness loss of dermis presenting as a shallow open injury with a red, pink wound bed without slough. THESE ARE NOT PRESSURE INJURIES; COVID TOES; RELATED TO VASCULOPATH (Active)  05/12/20 1930  Location: Toe (Comment  which one)  Location Orientation: Anterior;Left  Staging: Stage 2 -  Partial thickness loss of dermis presenting as a shallow open injury with a red, pink wound bed without slough.  Wound Description (Comments): THESE ARE NOT PRESSURE INJURIES; COVID TOES; RELATED TO VASCULOPATHY   Present on Admission: No     Pressure Injury 06/05/20 Jaw Right Unstageable - Full thickness tissue loss in which the base of the injury is covered by slough (yellow, tan, gray, green or brown) and/or eschar (tan, brown or black) in the wound bed. eschar (Active)  06/05/20 1437  Location: Jaw  Location Orientation: Right  Staging: Unstageable - Full thickness tissue loss in which the base of the injury is covered by slough (yellow, tan, gray, green or brown) and/or eschar (tan, brown or black) in the wound bed.  Wound Description (Comments): eschar  Present on Admission: No   Obesity Estimated body mass index is 33.56 kg/m as calculated from the following:   Height as of this encounter: 5\' 10"  (1.778 m).   Weight as of this encounter: 106.1 kg.   DVT prophylaxis: : Subcu Lovenox  Code Status: Full code  Family Communication: Discussed with the patient.  No family at bedside.     Status is: Inpatient  Remains inpatient appropriate because: Severe left upper extremity flaccid paralysis , need for ongoing rehab  Dispo: The patient is from: Home              Anticipated d/c is to: SNF /CIR              Anticipated d/c date is: Undetermined at this time.              Patient currently is medically stable to d/c if going to inpatient rehab where he can continue IV Solu-Medrol...  Will likely need long-term oxygen and BiPAP at nighttime.  Currently  undergoing high-dose IV steroids. Will continue to wean oxygen as able.    Consultants:   Critical care  Neurology  Procedures:  Intubation mechanical ventilation on May 05, 2020  Self extubated on May 20, 2020  BiPAP placement  Antibiotics:  . None at this time  Subjective: Patient denies any respiratory symptoms currently.  Shortness of breath has improved.  He also states that there has not been much improvement in the mobility of his left upper extremity.  Denies any nausea vomiting.   Objective: Vitals:   06/06/20 0828 06/06/20 0924  BP: (!) 172/92 (!) 139/100  Pulse: (!) 115 (!) 114  Resp: 18 16  Temp: 98.4 F (36.9 C) 97.6 F (36.4 C)  SpO2: 96% 93%    Intake/Output Summary (Last 24 hours) at 06/06/2020 1214 Last data filed at 06/06/2020 1033 Gross per 24 hour  Intake 240 ml  Output --  Net 240 ml   Filed Weights   06/04/20 0419 06/05/20 0500 06/06/20 0157  Weight: 06/08/20)  163.4 kg (!) 164.7 kg 106.1 kg   Body mass index is 33.56 kg/m.   Physical Exam:  General appearance: Awake alert.  In no distress Resp: Normal effort at rest.  Diminished air entry at the bases.  No wheezing or rhonchi. Cardio: S1-S2 is normal regular.  No S3-S4.  No rubs murmurs or bruit GI: Abdomen is soft.  Nontender nondistended.  Bowel sounds are present normal.  No masses organomegaly Extremities: No edema.  Neurologic: Patient is alert and oriented x3.  Noted to have decreased strength in the left upper extremity.    Data Review: I have personally reviewed the following laboratory data and studies,  CBC: Recent Labs  Lab 05/31/20 0603 06/04/20 0718 06/05/20 0450 06/06/20 0614  WBC 5.6 6.8 12.6* 11.6*  NEUTROABS  --  6.2 10.5* 9.1*  HGB 10.9* 12.8* 11.6* 12.0*  HCT 33.7* 39.8 35.8* 38.1*  MCV 89.4 87.5 87.7 90.3  PLT 172 147* 160 143*   Basic Metabolic Panel: Recent Labs  Lab 05/31/20 0603 06/02/20 0415 06/04/20 0718 06/05/20 0450 06/06/20 0614  NA 138  137 135 134* 138  K 3.3* 3.7 4.2 3.6 4.3  CL 96* 96* 96* 96* 98  CO2 34* 32 28 29 30   GLUCOSE 107* 117* 256* 193* 125*  BUN 17 12 18  26* 24*  CREATININE 0.41* 0.46* 0.63 0.45* 0.35*  CALCIUM 8.8* 8.9 9.6 9.2 9.6   Liver Function Tests: Recent Labs  Lab 05/31/20 0603  AST 23  ALT 72*  ALKPHOS 63  BILITOT 0.7  PROT 6.0*  ALBUMIN 3.1*   Cardiac Enzymes: Recent Labs  Lab 06/03/20 0905  CKTOTAL 29*   BNP (last 3 results) Recent Labs    05/03/20 1633 05/16/20 1435  BNP 31.1 77.7    CBG: Recent Labs  Lab 06/05/20 1249 06/05/20 1741 06/05/20 2128 06/06/20 0113 06/06/20 0830  GLUCAP 224* 285* 300* 241* 130*   No results found for this or any previous visit (from the past 240 hour(s)).   Studies: No results found.    06/08/20, MD  [facility] 06/06/2020

## 2020-06-06 NOTE — Progress Notes (Signed)
   06/06/20 0924  Assess: MEWS Score  Temp 97.6 F (36.4 C)  BP (!) 139/100  Pulse Rate (!) 114  Resp 16  SpO2 93 %  O2 Device Nasal Cannula  O2 Flow Rate (L/min) 2 L/min  Assess: MEWS Score  MEWS Temp 0  MEWS Systolic 0  MEWS Pulse 2  MEWS RR 0  MEWS LOC 0  MEWS Score 2  MEWS Score Color Yellow  Assess: if the MEWS score is Yellow or Red  Were vital signs taken at a resting state? Yes  Focused Assessment No change from prior assessment  Early Detection of Sepsis Score *See Row Information* Low  MEWS guidelines implemented *See Row Information* Yes  Treat  MEWS Interventions Administered scheduled meds/treatments;Escalated (See documentation below)  Pain Scale 0-10  Pain Score 0  Take Vital Signs  Increase Vital Sign Frequency  Yellow: Q 2hr X 2 then Q 4hr X 2, if remains yellow, continue Q 4hrs  Escalate  MEWS: Escalate Yellow: discuss with charge nurse/RN and consider discussing with provider and RRT  Notify: Charge Nurse/RN  Name of Charge Nurse/RN Notified Tammy Todd  Date Charge Nurse/RN Notified 06/06/20  Time Charge Nurse/RN Notified [phone]  Document  Patient Outcome Other (Comment) (continue to follow MEWS guidelines)  Progress note created (see row info) Yes

## 2020-06-06 NOTE — Progress Notes (Addendum)
Inpatient Rehab Admissions:  Inpatient Rehab Consult received.  Discussed pt progress with Dr. Carlis Abbott, who agrees pt now would be able to tolerate CIR.  I spoke to pt and his mother over the phone for rehabilitation assessment and to discuss goals and expectations of an inpatient rehab admission.  Both are extremely hopeful for CIR admission.  Pt's mother and his 33 y/o nephew are able to provide 24/7 supervision at discharge.  Pt has made excellent progress with therapy in the last week and feel he could reach supervision to mod I level for household mobility.  I will need insurance authorization for CIR so will start prior auth request today.  Will continue to follow for possible admission pending insurance approval and bed availability.   Signed: Estill Dooms, PT, DPT Admissions Coordinator [phone] 06/06/20  1:05 PM

## 2020-06-06 NOTE — Plan of Care (Signed)
  Problem: Nutrition: Goal: Adequate nutrition will be maintained Outcome: Progressing   Problem: Elimination: Goal: Will not experience complications related to urinary retention Outcome: Progressing   Problem: Elimination: Goal: Will not experience complications related to bowel motility Outcome: Not Progressing   Problem: Skin Integrity: Goal: Risk for impaired skin integrity will decrease Outcome: Not Progressing   Problem: Health Behavior/Discharge Planning: Goal: Ability to manage health-related needs will improve Outcome: Not Progressing

## 2020-06-06 NOTE — Progress Notes (Signed)
Physical Therapy Treatment Patient Details Name: Collin Brown MRN: [id number] DOB: 04-20-1986 Today's Date: 06/06/2020    History of Present Illness Collin Brown is a 34 year old male who comes to [facility] on 9/23 after progression of dyspnea. Pt recently (+)COVID19 along with other family. Pt required intubation on 9/25, self extubated on 10/10. At evaluation pt presenting on HHHF @55L /min, 90%FiO2.    PT Comments    Pt presents in fowler's position on arrival to room and agreeable to PT session. Pt is CGA for supine to sit as he doesn't need outside assistance. Pt relies heavily on bedrails to help sit himself up. Pt performs 2 sit<>stands. Pt needed modA+2 to stand while pushing his RUE on counter. Pt able to stand up straight with VCs and only CGA while standing. Pt stands for 30 seconds but needs to sit due to fatigue. After an extended break, pt stands again. This time he is able to take his RUE off the counter and stand up with great posture for 3 minutes and only CGA+2 for safety. Pt attempted a third time but unable to stand due to quads fatiguing. Pt performs seated exercises before scooting up towards HOB.  During sit to supine, pt is minA+1 as he needed assistance with LEs. Pt able to scoot himself up independently with the bed in trendelenburg as he uses his BLEs. Pt encouraged to keep performing his exercises throughout the day. He is left in bed with all needs. Monitored vitals throughout session and listed below. Ptcontinues to benefit from skilledPT services to maximize return to PLOF and minimize risk of future falls, injury, caregiver burden, and readmission. Will continue to follow POC. Discharge recommendation remains appropriate.  Vitals: Supine: O2: 91% 2L; HR: 116; Increased O2 to 3L; O2: 93% 3L; HR: 114 Sitting: O2: 88% 3L; increased O2 to 4L after consulting nursing; O2: 90% 4L Standing attempt #1: O2: 86% 4L, HR: 142;  Sitting after a few minutes: O2: 90% 4L, HR  130 Standing attempt #2: O2: 71%, HR: 144; after a few minutes O2 increased to 83% 4L; HR: 142 Sitting: O2: 88% 4L; HR: 134 Supine in bed: 94% 4L, decreased to 2L O2: 93% 2L; HR: 112   Follow Up Recommendations  CIR     Equipment Recommendations  Other (comment) (Defer to next session)    Recommendations for Other Services       Precautions / Restrictions Precautions Precautions: Fall Precaution Comments: flaccid LUE (protect from subluxation) Required Braces or Orthoses: Sling (when OOB) Restrictions Weight Bearing Restrictions: No    Mobility  Bed Mobility Overal bed mobility: Needs Assistance Bed Mobility: Supine to Sit;Sit to Supine     Supine to sit: Min guard Sit to supine: Min assist   General bed mobility comments: During supine to sit, patient performs with CGA but doesn't need outside assistance. For sit to supine, pt is minA+1 as he needed assistance with LEs. Pt able to scoot himself up independently with proper bed set up  Transfers Overall transfer level: Needs assistance Equipment used: 2 person hand held assist Transfers: Sit to/from Stand Sit to Stand: Mod assist;+2 physical assistance;From elevated surface         General transfer comment: Pt performs 2 sit<>stands. Pt needed modA+2 to stand with RUE on counter and able to stand up straight. Pt attempted a third time but unable to stand due to quads fatiguing.  Ambulation/Gait             General  Gait Details: Pt attempted weight shifting when standing, however unable to perform due to patient off balance   Stairs             Wheelchair Mobility    Modified Rankin (Stroke Patients Only)       Balance Overall balance assessment: Needs assistance Sitting-balance support: Feet supported Sitting balance-Leahy Scale: Good Sitting balance - Comments: Pt able to maintain balance once feet are supported, and demonstrates good balance while with external pertubations   Standing  balance support: No upper extremity supported;During functional activity;Single extremity supported Standing balance-Leahy Scale: Fair Standing balance comment: Patient able to stand with minA+2 assistance the first time. He is able to stand up straight however fatigues after 30 seconds. The second time, patient able to maintain balance without any UE support and only CGA. He stands for 3 minutes before needing to sit down                            Cognition Arousal/Alertness: Awake/alert Behavior During Therapy: WFL for tasks assessed/performed;Flat affect Overall Cognitive Status: Within Functional Limits for tasks assessed                                 General Comments: Pt more talkative this session and carries on a conversation      Exercises General Exercises - Lower Extremity Long Arc Quad: AROM;Both;10 reps;Seated Hip ABduction/ADduction: Strengthening;Both;10 reps;Seated (clam shells with manual resistance) Hip Flexion/Marching: Strengthening;Both;10 reps;Seated (manual resistance) Other Exercises Other Exercises: Pt performs 7 breaths with IS, reachines 1500 mL consistently. Pt also performs flutter valves x 10 breaths Other Exercises: Pt scoots towards HOB three times while sitting on the EOB. Patient able to lift himself up and over, however fatigues quickly.     General Comments        Pertinent Vitals/Pain Pain Assessment: No/denies pain    Home Living                      Prior Function            PT Goals (current goals can now be found in the care plan section) Acute Rehab PT Goals Patient Stated Goal: Pt wants to return home and back to work when ready PT Goal Formulation: With patient Time For Goal Achievement: 06/18/20 Potential to Achieve Goals: Fair Progress towards PT goals: Progressing toward goals    Frequency    Min 2X/week      PT Plan Current plan remains appropriate    Co-evaluation               AM-PAC PT "6 Clicks" Mobility   Outcome Measure  Help needed turning from your back to your side while in a flat bed without using bedrails?: A Lot Help needed moving from lying on your back to sitting on the side of a flat bed without using bedrails?: Total Help needed moving to and from a bed to a chair (including a wheelchair)?: A Lot Help needed standing up from a chair using your arms (e.g., wheelchair or bedside chair)?: A Lot Help needed to walk in hospital room?: Total Help needed climbing 3-5 steps with a railing? : Total 6 Click Score: 9    End of Session Equipment Utilized During Treatment: Gait belt;Oxygen Activity Tolerance: Patient limited by fatigue Patient left: in bed;with call bell/phone within reach;with bed  alarm set Nurse Communication: Mobility status PT Visit Diagnosis: Difficulty in walking, not elsewhere classified (R26.2);Other abnormalities of gait and mobility (R26.89);Muscle weakness (generalized) (M62.81)     Time: 7672-0947 PT Time Calculation (min) (ACUTE ONLY): 53 min  Charges:                        Katherine Basset, SPT   Baker Pierini 06/06/2020, 3:44 PM

## 2020-06-07 DIAGNOSIS — E669 Obesity, unspecified: Secondary | ICD-10-CM | POA: Diagnosis not present

## 2020-06-07 DIAGNOSIS — R29898 Other symptoms and signs involving the musculoskeletal system: Secondary | ICD-10-CM | POA: Diagnosis not present

## 2020-06-07 DIAGNOSIS — Z6833 Body mass index (BMI) 33.0-33.9, adult: Secondary | ICD-10-CM | POA: Diagnosis not present

## 2020-06-07 LAB — GLUCOSE, CAPILLARY
Glucose-Capillary: 115 mg/dL — ABNORMAL HIGH (ref 70–99)
Glucose-Capillary: 268 mg/dL — ABNORMAL HIGH (ref 70–99)
Glucose-Capillary: 224 mg/dL — ABNORMAL HIGH (ref 70–99)
Glucose-Capillary: 268 mg/dL — ABNORMAL HIGH (ref 70–99)

## 2020-06-07 LAB — BASIC METABOLIC PANEL
Anion gap: 9 (ref 5–15)
BUN: 30 mg/dL — ABNORMAL HIGH (ref 6–20)
CO2: 29 mmol/L (ref 22–32)
Calcium: 9.1 mg/dL (ref 8.9–10.3)
Chloride: 99 mmol/L (ref 98–111)
Creatinine, Ser: 0.44 mg/dL — ABNORMAL LOW (ref 0.61–1.24)
GFR, Estimated: >60 mL/min (ref >60)
Glucose, Bld: 178 mg/dL — ABNORMAL HIGH (ref 70–99)
Potassium: 4.2 mmol/L (ref 3.5–5.1)
Sodium: 137 mmol/L (ref 135–145)

## 2020-06-07 LAB — CBC WITH DIFFERENTIAL/PLATELET
Abs Immature Granulocytes: 0.16 10*3/uL — ABNORMAL HIGH (ref 0.00–0.07)
Basophils Absolute: 0 10*3/uL (ref 0.0–0.1)
Basophils Relative: 0 %
Eosinophils Absolute: 0 10*3/uL (ref 0.0–0.5)
Eosinophils Relative: 0 %
HCT: 36.1 % — ABNORMAL LOW (ref 39.0–52.0)
Hemoglobin: 11.4 g/dL — ABNORMAL LOW (ref 13.0–17.0)
Immature Granulocytes: 2 %
Lymphocytes Relative: 12 %
Lymphs Abs: 1.3 10*3/uL (ref 0.7–4.0)
MCH: 28.5 pg (ref 26.0–34.0)
MCHC: 31.6 g/dL (ref 30.0–36.0)
MCV: 90.3 fL (ref 80.0–100.0)
Monocytes Absolute: 0.8 10*3/uL (ref 0.1–1.0)
Monocytes Relative: 8 %
Neutro Abs: 8.4 10*3/uL — ABNORMAL HIGH (ref 1.7–7.7)
Neutrophils Relative %: 78 %
Platelets: 136 10*3/uL — ABNORMAL LOW (ref 150–400)
RBC: 4 MIL/uL — ABNORMAL LOW (ref 4.22–5.81)
RDW: 17.9 % — ABNORMAL HIGH (ref 11.5–15.5)
WBC: 10.7 10*3/uL — ABNORMAL HIGH (ref 4.0–10.5)
nRBC: 0 % (ref 0.0–0.2)

## 2020-06-07 MED ORDER — METOPROLOL TARTRATE 50 MG PO TABS
50.0000 mg | ORAL_TABLET | Freq: Two times a day (BID) | ORAL | Status: DC
  Administered 2020-06-07 – 2020-06-08 (×3): 50 mg via ORAL
  Filled 2020-06-07 (×3): qty 1

## 2020-06-07 MED ORDER — INSULIN DETEMIR 100 UNIT/ML ~~LOC~~ SOLN
15.0000 [IU] | Freq: Two times a day (BID) | SUBCUTANEOUS | Status: DC
  Administered 2020-06-08: 15 [IU] via SUBCUTANEOUS
  Filled 2020-06-07 (×2): qty 0.15

## 2020-06-07 NOTE — Progress Notes (Addendum)
PROGRESS NOTE  BRYANN MCNEALY OEU:[id number] DOB: [birth year] DOA: 05/03/2020 PCP: Patient, No Pcp Per   LOS: 35 days   Brief narrative: As per HPI,  JULIA KULZER is a 34 y.o. male with medical history significant for obstructive sleep apnea, morbid obesity, presented to the hospital on May 03, 2020 with cough, shortness of breath, generalized weakness, fever and a positive Covid 19 test. He was admitted to the medical floor for acute hypoxemic respiratory failure secondary to COVID-19 pneumonia. He was treated with IV remdesivir and IV steroids. Baricitinib was not given because of leukopenia. His condition deteriorated and hypoxemia worsened. He was transferred to the ICU on May 05, 2020 where he was initially treated with BiPAP. However, he failed BiPAP therapy and so he was eventually intubated and placed on mechanical ventilation on May 05, 2020. He self extubated on May 20, 2020 was transitioned to BiPAP. Hospital course was complicated by acute metabolic encephalopathy and ARDS. His condition slowly improved and was transitioned to oxygenation via high flow nasal cannula.  He was then transferred to the floor for further management.    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    MRI of the brain showed chronic microhemorrhages, C-spine MRI showed minor degenerative change at C4-C5 but no abnormal cord signal.  CT scan of the chest with contrast to shoulder no obvious changes in the brachial plexus.   Neurology was consulted.  Patient started on 5-day course of high-dose IV steroids for possible brachial plexopathy.  This was initiated on 10/24.  Today is day 5.   Patient is able to move the fingers and is left hand and has regained some movement of his left shoulder but still unable to do anything with the arm.  He mentions that he was able to walk some yesterday.  Continue with PT and OT.  Plan is for CIR.   Discussed with  neurology today.  No other interventions or treatment recommended.  Outpatient EMG/NCV.  Ambulatory referral sent to neurology, Dr. Allena Katz in [place].  Acute hypoxic respiratory failure secondary to COVID-19 pneumonia with ARDS:  Received treatment for pneumonia with IV steroids. Patient self extubated and has been on BiPAP at nighttime and nasal cannula during the daytime.   To be stable.  He is down to 2 L of oxygen by nasal cannula.  Continue to wean as tolerated.  Continue with incentive spirometry and mobilization.  He is currently off of isolation.  Right mild ulnar neuropathy Stable  Diabetes mellitus type 2 Hemoglobin A1c was 7.2.   This appears to be a new diagnosis for this patient.  No glucose lowering drugs noted on his home medication list.  Likely due to steroids.   Remains on Levemir and SSI.  CBGs are reasonably well controlled.   Anticipate CBGs will decrease once he is off of steroids.  Plan to decrease the dose of his Levemir starting tomorrow.   LDL 153.  Patient to be started on statin at discharge.     Obstructive sleep apnea/chronic hypercapnic respiratory failure/probable OHS Uses CPAP at home.  Pulmonology recommended BiPAP here in the hospital at nighttime.    Extreme debility, deconditioning Continue PT, OT.  Physical therapy recommended CIR.  Transition of care on board for placement.  Severe constipation Improved.  Continue senna, MiraLAX.    Pressure ulceration Present on admission. Continue Wound care.  Pressure Injury 05/12/20 Toe (Comment  which one) Anterior;Right Stage 2 -  Partial thickness  loss of dermis presenting as a shallow open injury with a red, pink wound bed without slough. THESE ARE NOT PRESSURE INJURIES; COVID TOES; RELATED TO VASCULOPAT (Active)  05/12/20 1930  Location: Toe (Comment  which one)  Location Orientation: Anterior;Right  Staging: Stage 2 -  Partial thickness loss of dermis presenting as a shallow open injury with a red,  pink wound bed without slough.  Wound Description (Comments): THESE ARE NOT PRESSURE INJURIES; COVID TOES; RELATED TO VASCULOPATHY 175102  Present on Admission: No     Pressure Injury 05/12/20 Toe (Comment  which one) Anterior;Left Stage 2 -  Partial thickness loss of dermis presenting as a shallow open injury with a red, pink wound bed without slough. THESE ARE NOT PRESSURE INJURIES; COVID TOES; RELATED TO VASCULOPATH (Active)  05/12/20 1930  Location: Toe (Comment  which one)  Location Orientation: Anterior;Left  Staging: Stage 2 -  Partial thickness loss of dermis presenting as a shallow open injury with a red, pink wound bed without slough.  Wound Description (Comments): THESE ARE NOT PRESSURE INJURIES; COVID TOES; RELATED TO VASCULOPATHY   Present on Admission: No     Pressure Injury 06/05/20 Jaw Right Unstageable - Full thickness tissue loss in which the base of the injury is covered by slough (yellow, tan, gray, green or brown) and/or eschar (tan, brown or black) in the wound bed. eschar (Active)  06/05/20 1437  Location: Jaw  Location Orientation: Right  Staging: Unstageable - Full thickness tissue loss in which the base of the injury is covered by slough (yellow, tan, gray, green or brown) and/or eschar (tan, brown or black) in the wound bed.  Wound Description (Comments): eschar  Present on Admission: No   Obesity Estimated body mass index is 51.88 kg/m as calculated from the following:   Height as of this encounter: 5\' 10"  (1.778 m).   Weight as of this encounter: 164 kg.   DVT prophylaxis: : Subcu Lovenox  Code Status: Full code  Family Communication: Discussed with the patient.  Discussed with his mother yesterday.  Status is: Inpatient  Remains inpatient appropriate because:Unsafe d/c plan and Inpatient level of care appropriate due to severity of illness   Dispo: The patient is from: Home              Anticipated d/c is to: CIR              Anticipated d/c date  is: 1 day              Patient currently is not medically stable to d/c.      Consultants:   Critical care  Neurology  Procedures:  Intubation mechanical ventilation on May 05, 2020  Self extubated on May 20, 2020  BiPAP placement  Antibiotics:  . None at this time  Subjective: Patient denies any new complaints today.  Shortness of breath has improved.  No nausea vomiting.  Has regained some movement in his fingers on the left side as well as his left shoulder.     Objective: Vitals:   06/07/20 0456 06/07/20 0815  BP: (!) 135/95 (!) 165/108  Pulse: 99 95  Resp: 20 17  Temp: 98.4 F (36.9 C) 98 F (36.7 C)  SpO2: 97% 97%    Intake/Output Summary (Last 24 hours) at 06/07/2020 1009 Last data filed at 06/07/2020 0210 Gross per 24 hour  Intake 240 ml  Output 1475 ml  Net -1235 ml   Filed Weights   06/05/20 0500 06/06/20  0157 06/07/20 0456  Weight: (!) 164.7 kg 106.1 kg (!) 164 kg   Body mass index is 51.88 kg/m.    Physical Exam:  General appearance: Awake alert.  In no distress Resp: Normal effort.  Few crackles at the bases.  No wheezing or rhonchi. Cardio: S1-S2 is normal regular.  No S3-S4.  No rubs murmurs or bruit GI: Abdomen is soft.  Nontender nondistended.  Bowel sounds are present normal.  No masses organomegaly Extremities: No edema.  Decreased mobility of the left upper extremity Neurologic: Alert and oriented x3.  Decreased strength in the left upper extremity as discussed above.    Data Review: I have personally reviewed the following laboratory data and studies,  CBC: Recent Labs  Lab 06/04/20 0718 06/05/20 0450 06/06/20 0614 06/07/20 0341  WBC 6.8 12.6* 11.6* 10.7*  NEUTROABS 6.2 10.5* 9.1* 8.4*  HGB 12.8* 11.6* 12.0* 11.4*  HCT 39.8 35.8* 38.1* 36.1*  MCV 87.5 87.7 90.3 90.3  PLT 147* 160 143* 136*   Basic Metabolic Panel: Recent Labs  Lab 06/02/20 0415 06/04/20 0718 06/05/20 0450 06/06/20 0614 06/07/20 0341  NA 137  135 134* 138 137  K 3.7 4.2 3.6 4.3 4.2  CL 96* 96* 96* 98 99  CO2 32 28 29 30 29   GLUCOSE 117* 256* 193* 125* 178*  BUN 12 18 26* 24* 30*  CREATININE 0.46* 0.63 0.45* 0.35* 0.44*  CALCIUM 8.9 9.6 9.2 9.6 9.1   Cardiac Enzymes: Recent Labs  Lab 06/03/20 0905  CKTOTAL 29*   BNP (last 3 results) Recent Labs    05/03/20 1633 05/16/20 1435  BNP 31.1 77.7    CBG: Recent Labs  Lab 06/06/20 0830 06/06/20 1240 06/06/20 1733 06/06/20 2122 06/07/20 0813  GLUCAP 130* 143* 258* 272* 115*   No results found for this or any previous visit (from the past 240 hour(s)).   Studies: No results found.    06/09/20, MD  [facility] 06/07/2020

## 2020-06-07 NOTE — H&P (Signed)
Physical Medicine and Rehabilitation Admission H&P    Chief Complaint  Patient presents with  . Cough  . Shortness of Breath  : HPI: Collin Brown is a 34 year old right-handed male with obstructive sleep apnea, morbid obesity BMI 51.88.  History taken from chart review, mother, and patient.  Patient lives with his mother and 14 year old nephew.  Reportedly independent prior to admission working as a Production designer, theatre/television/film at [employer].  He presented on 05/03/2020 with increasing shortness of breath, cough, and low-grade fever.  Reportedly patient had recently traveled to [place] to visit his brother over the summer. .  Patient found to be COVID-19 positive.  He was treated with IV remdesivir and IV steroids.  His condition  did initially deteriorate and required intubation and ultimately self extubated on 05/20/2020 and transition to BiPAP.  Hospital course further complicated by acute metabolic encephalopathy.  CT/MRI unremarkable for acute intracranial process.  Patient did have some complaints of left shoulder pain with weakness and evaluated for possible brachial plexus mass. CT of the chest completed showing no abnormality at the level of left brachial plexus.  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  He was placed on Lovenox for DVT prophylaxis.  Findings of elevated hemoglobin A1c 7.2 and placed on insulin therapy. WOC follow-up for pressure injury gluteal cleft with wound care as directed.  He is tolerating a mechanical soft thin liquid diet.  Therapy evaluations completed and patient was admitted for a comprehensive rehab program.  Please see preadmission assessment from earlier today as well.  Review of Systems  Constitutional: Positive for malaise/fatigue. Negative for chills.  HENT: Negative for hearing loss.   Eyes: Negative for blurred vision and double vision.  Respiratory: Positive for cough and  shortness of breath. Negative for wheezing.   Cardiovascular: Positive for leg swelling. Negative for chest pain and palpitations.  Gastrointestinal: Positive for constipation. Negative for heartburn, nausea and vomiting.  Genitourinary: Negative for dysuria, flank pain and hematuria.  Musculoskeletal: Positive for joint pain and myalgias.  Skin: Negative for rash.  Neurological: Positive for sensory change, focal weakness and weakness.  All other systems reviewed and are negative.  Past Medical History:  Diagnosis Date  . Diabetes mellitus without complication (HCC)   . Hearing loss in right ear   . Hypertension   . Sleep apnea    Past Surgical History:  Procedure Laterality Date  . TONSILLECTOMY     Family History  Problem Relation Age of Onset  . Diabetes Mellitus II Mother   . Hypertension Mother   . Diabetes Mellitus II Father   . High blood pressure Father    Social History:  reports that he has never smoked. He has never used smokeless tobacco. He reports that he does not drink alcohol and does not use drugs. Allergies: No Known Allergies Medications Prior to Admission  Medication Sig Dispense Refill  . amoxicillin-clavulanate (AUGMENTIN) 875-125 MG tablet Take 1 tablet by mouth 2 (two) times daily. (Patient not taking: Reported on 05/03/2020) 20 tablet 0    Drug Regimen Review Drug regimen was reviewed and remains appropriate with no significant issues identified  Home: Home Living Family/patient expects to be discharged to:: Private residence Living Arrangements: Parent, Other (Comment) (Mother and 19yo nephew) Available Help at Discharge: Family Type of Home: House Home Layout: One level Home Equipment: None Additional Comments: Pt unable to verbalize, therapist unable to read lips, unable  to hold pen to write; mom's phone goes straight to voicemail; will verify as able   Functional History: Prior Function Level of Independence: Independent Comments: Pt  wokred fulltime   Functional Status:  Mobility: Bed Mobility Overal bed mobility: Needs Assistance Bed Mobility: Supine to Sit, Sit to Supine Rolling: Mod assist Supine to sit: Mod assist Sit to supine: +2 for safety/equipment, Mod assist General bed mobility comments: During supine to sit, pt is mod+1 as he needs help with trunk. Pt sat up on the L side of bed today, which is more difficult for patient. For sit to supine, pt was modA+2 as he needed assistance with trunk and LEs. Pt performed rolling to both sides with modA to straighten out bed linen Transfers Overall transfer level: Needs assistance Equipment used: 2 person hand held assist Transfer via Lift Equipment: [place] Dec Lift (Attempted STS lift to recliner, but Large size sling was not big enough. Unable to find XL sling) Transfers: Sit to/from Stand Sit to Stand: +2 physical assistance, From elevated surface, Max assist General transfer comment: Pt attempted sit<>stand with maxA+2. However, unable to fully stand up as patient is fatigued Ambulation/Gait General Gait Details: Did not attempt due to pt fatigued    ADL: ADL Overall ADL's : Needs assistance/impaired Eating/Feeding: Bed level, With adaptive utensils, Set up, Supervision/ safety Eating/Feeding Details (indicate cue type and reason): supervision for safety to minimize risk of aspiration; HOB 90degr, pillows positioned to support RUE to support improved tolerance for self feeding. Red foam built up handle applied to spoon with noted improvement in quality of movement to scoop and bring to mouth and pt endorses improvement; pt able to eat approx 25% of meal after set up requiring assist for another 25% 2/2 fatigue Grooming: Wash/dry face, Bed level, Maximal assistance Grooming Details (indicate cue type and reason): max A to perform requiring rest breaks 2/2 significant fatigue General ADL Comments: SETUP + CGA tooth brushing and face washing seated EOB, MIN A washing hair  - assist for back of head / thoroughness. MAX A for LBD at bed level   Cognition: Cognition Overall Cognitive Status: Within Functional Limits for tasks assessed Orientation Level: Oriented X4 Cognition Arousal/Alertness: Awake/alert Behavior During Therapy: WFL for tasks assessed/performed, Flat affect Overall Cognitive Status: Within Functional Limits for tasks assessed Area of Impairment: Following commands Current Attention Level: Alternating, Divided Following Commands: Follows one step commands with increased time Problem Solving: Slow processing General Comments: Pt more talkative this session and carries on a conversation Difficult to assess due to: Impaired communication  Physical Exam: Blood pressure (!) 162/100, pulse (!) 102, temperature 97.8 F (36.6 C), temperature source Oral, resp. rate 17, height 5\' 10"  (1.778 m), weight (!) 161.7 kg, SpO2 96 %. Physical Exam Vitals reviewed.  Constitutional:      General: He is not in acute distress.    Appearance: He is obese.  HENT:     Head: Normocephalic and atraumatic.     Right Ear: External ear normal.     Left Ear: External ear normal.     Nose: Nose normal.  Eyes:     General:        Right eye: No discharge.        Left eye: No discharge.     Extraocular Movements: Extraocular movements intact.  Cardiovascular:     Rate and Rhythm: Normal rate and regular rhythm.  Pulmonary:     Effort: Pulmonary effort is normal. No respiratory distress.  Breath sounds: Normal breath sounds.     Comments: + Toad Hop. Abdominal:     General: Abdomen is flat. Bowel sounds are normal. There is no distension.  Musculoskeletal:     Cervical back: Normal range of motion and neck supple.     Comments: Edema left upper extremity, bilateral lower extremities  Skin:    General: Skin is warm and dry.  Neurological:     Mental Status: He is alert.     Comments: Alert and oriented Follows commands Hyperalgesia LUE Motor:RUE: 4/5 B/l  LE: HF, KE 4-/5, ADF 4/5 RUE: Shoulder abduction 2-/5, elbow flexion/extention 1+/5, wrist extension 2+/5, hand grip 3-/5  Psychiatric:        Mood and Affect: Mood normal.        Behavior: Behavior normal.     Results for orders placed or performed during the hospital encounter of 05/03/20 (from the past 48 hour(s))  Glucose, capillary     Status: Abnormal   Collection Time: 06/06/20  5:33 PM  Result Value Ref Range   Glucose-Capillary 258 (H) 70 - 99 mg/dL    Comment: Glucose reference range applies only to samples taken after fasting for at least 8 hours.  Glucose, capillary     Status: Abnormal   Collection Time: 06/06/20  9:22 PM  Result Value Ref Range   Glucose-Capillary 272 (H) 70 - 99 mg/dL    Comment: Glucose reference range applies only to samples taken after fasting for at least 8 hours.  CBC with Differential/Platelet     Status: Abnormal   Collection Time: 06/07/20  3:41 AM  Result Value Ref Range   WBC 10.7 (H) 4.0 - 10.5 K/uL   RBC 4.00 (L) 4.22 - 5.81 MIL/uL   Hemoglobin 11.4 (L) 13.0 - 17.0 g/dL   HCT 55.7 (L) 39 - 52 %   MCV 90.3 80.0 - 100.0 fL   MCH 28.5 26.0 - 34.0 pg   MCHC 31.6 30.0 - 36.0 g/dL   RDW 32.2 (H) 02.5 - 42.7 %   Platelets 136 (L) 150 - 400 K/uL   nRBC 0.0 0.0 - 0.2 %   Neutrophils Relative % 78 %   Neutro Abs 8.4 (H) 1.7 - 7.7 K/uL   Lymphocytes Relative 12 %   Lymphs Abs 1.3 0.7 - 4.0 K/uL   Monocytes Relative 8 %   Monocytes Absolute 0.8 0.1 - 1.0 K/uL   Eosinophils Relative 0 %   Eosinophils Absolute 0.0 0.0 - 0.5 K/uL   Basophils Relative 0 %   Basophils Absolute 0.0 0.0 - 0.1 K/uL   Immature Granulocytes 2 %   Abs Immature Granulocytes 0.16 (H) 0.00 - 0.07 K/uL    Comment: Performed at [facility], [address] [place], [place] [address]  Basic metabolic panel     Status: Abnormal   Collection Time: 06/07/20  3:41 AM  Result Value Ref Range   Sodium 137 135 - 145 mmol/L   Potassium 4.2 3.5 - 5.1 mmol/L    Chloride 99 98 - 111 mmol/L   CO2 29 22 - 32 mmol/L   Glucose, Bld 178 (H) 70 - 99 mg/dL    Comment: Glucose reference range applies only to samples taken after fasting for at least 8 hours.   BUN 30 (H) 6 - 20 mg/dL   Creatinine, Ser 6.28 (L) 0.61 - 1.24 mg/dL   Calcium 9.1 8.9 - 31.5 mg/dL   GFR, Estimated >17 >61 mL/min    Comment: (  NOTE) Calculated using the CKD-EPI Creatinine Equation (2021)    Anion gap 9 5 - 15    Comment: Performed at [facility], [address] [place], [place] [address]  Glucose, capillary     Status: Abnormal   Collection Time: 06/07/20  8:13 AM  Result Value Ref Range   Glucose-Capillary 115 (H) 70 - 99 mg/dL    Comment: Glucose reference range applies only to samples taken after fasting for at least 8 hours.  Glucose, capillary     Status: Abnormal   Collection Time: 06/07/20 11:51 AM  Result Value Ref Range   Glucose-Capillary 268 (H) 70 - 99 mg/dL    Comment: Glucose reference range applies only to samples taken after fasting for at least 8 hours.  Glucose, capillary     Status: Abnormal   Collection Time: 06/07/20  3:40 PM  Result Value Ref Range   Glucose-Capillary 224 (H) 70 - 99 mg/dL    Comment: Glucose reference range applies only to samples taken after fasting for at least 8 hours.  Glucose, capillary     Status: Abnormal   Collection Time: 06/07/20  9:37 PM  Result Value Ref Range   Glucose-Capillary 268 (H) 70 - 99 mg/dL    Comment: Glucose reference range applies only to samples taken after fasting for at least 8 hours.  CBC with Differential/Platelet     Status: Abnormal   Collection Time: 06/08/20  5:27 AM  Result Value Ref Range   WBC 11.2 (H) 4.0 - 10.5 K/uL   RBC 3.98 (L) 4.22 - 5.81 MIL/uL   Hemoglobin 11.3 (L) 13.0 - 17.0 g/dL   HCT 08.6 (L) 39 - 52 %   MCV 89.9 80.0 - 100.0 fL   MCH 28.4 26.0 - 34.0 pg   MCHC 31.6 30.0 - 36.0 g/dL   RDW 57.8 (H) 46.9 - 62.9 %   Platelets 136 (L) 150 - 400 K/uL   nRBC 0.4 (H)  0.0 - 0.2 %   Neutrophils Relative % 72 %   Neutro Abs 8.1 (H) 1.7 - 7.7 K/uL   Lymphocytes Relative 17 %   Lymphs Abs 1.9 0.7 - 4.0 K/uL   Monocytes Relative 9 %   Monocytes Absolute 1.0 0.1 - 1.0 K/uL   Eosinophils Relative 0 %   Eosinophils Absolute 0.0 0.0 - 0.5 K/uL   Basophils Relative 0 %   Basophils Absolute 0.0 0.0 - 0.1 K/uL   Immature Granulocytes 2 %   Abs Immature Granulocytes 0.26 (H) 0.00 - 0.07 K/uL    Comment: Performed at [facility], [address] [place], [place] [address]  Basic metabolic panel     Status: Abnormal   Collection Time: 06/08/20  5:27 AM  Result Value Ref Range   Sodium 137 135 - 145 mmol/L   Potassium 3.9 3.5 - 5.1 mmol/L   Chloride 99 98 - 111 mmol/L   CO2 30 22 - 32 mmol/L   Glucose, Bld 146 (H) 70 - 99 mg/dL    Comment: Glucose reference range applies only to samples taken after fasting for at least 8 hours.   BUN 32 (H) 6 - 20 mg/dL   Creatinine, Ser <3.24 (L) 0.61 - 1.24 mg/dL   Calcium 9.2 8.9 - 40.1 mg/dL   GFR, Estimated NOT CALCULATED >60 mL/min    Comment: (NOTE) Calculated using the CKD-EPI Creatinine Equation (2021)    Anion gap 8 5 - 15    Comment: Performed at [facility], [address] [place], [place] [address]  Glucose, capillary     Status: None   Collection Time: 06/08/20  8:13 AM  Result Value Ref Range   Glucose-Capillary 96 70 - 99 mg/dL    Comment: Glucose reference range applies only to samples taken after fasting for at least 8 hours.   No results found.     Medical Problem List and Plan: 1.  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  Check oxygen saturations every shift.  Patient is no longer on isolation  -patient may shower  -ELOS/Goals: 18-21 days/supervision/min A.  Admit to CIR  Wean supplemental oxygen as tolerated 2.  Antithrombotics: -DVT/anticoagulation: Lovenox 85 mg daily.    -antiplatelet therapy:  Aspirin 81 mg daily 3. Pain Management: Advil as needed  Oxycodone as needed  Gabapentin 100 3 times daily started for neuropathic pain. 4. Mood: Provide emotional support  -antipsychotic agents: N/A 5. Neuropsych: This patient is capable of making decisions on his own behalf. 6. Skin/Wound Care:Santyl ointment applied full-thickness wound gluteal cleft daily routine skin checks.  Foam dressing bilateral buttock every 3 days 7. Fluids/Electrolytes/Nutrition: Routine in and outs.  CMP ordered. 8.  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  Patient completed 5-day course Solu-Medrol  Supportive care with range of motion  Recommend NCV/EMG as outpatient. 9.  New onset diabetes mellitus, hemoglobin A1c 7.2.  NovoLog 4 units 3 times daily, Levemir 15 units twice daily.  Provide diabetic teaching  Monitor with increased mobility 10.  Hypertension with tachycardia.  Lopressor 50 mg twice daily.    Monitor with increased mobility 11.  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  Dietary follow-up  Charlton Amor, PA-C 06/08/2020  I have personally performed a face to face diagnostic evaluation, including, but not limited to relevant history and physical exam findings, of this patient and developed relevant assessment and plan.  Additionally, I have reviewed and concur with the physician assistant's documentation above.  Maryla Morrow, MD, ABPMR

## 2020-06-07 NOTE — PMR Pre-admission (Signed)
PMR Admission Coordinator Pre-Admission Assessment  Patient: Collin Brown is an 34 y.o., male MRN: [id number] DOB: [birth year] Height: '5\' 10"'  (177.8 cm) Weight: (!) 161.7 kg  Insurance Information HMO:     PPO: yes     PCP:      IPA:      80/20:      OTHER:  PRIMARY: BCBS [employer]      Policy#: [id number]      Subscriber: pt CM Name: Nira Conn       Phone#: [phone] [id number]     Fax#: [phone] Pre-Cert#: [id number] auth for CIR admit on 10/29 provided by Nira Conn with BCBS of [place] with updates due to fax listed above on 11/5.       Employer:  Benefits:  Phone #: [phone]     Name:  Eff. Date: 08/12/19     Deduct: $3000 (met)      Out of Pocket Max: [phone] (met [phone])      Life Max:  CIR: 75%      SNF: 75% Outpatient: 75%     Co-Ins: 25% Home Health: 75%      Co-Ins: 25% DME: 75%     Co-Ins: 25% Providers: preferred network SECONDARY:       Policy#:      Phone#:   Development worker, community:       Phone#:   The Engineer, petroleum" for patients in Inpatient Rehabilitation Facilities with attached "Privacy Act [place] Records" was provided and verbally reviewed with: N/A  Emergency Contact Information Contact Information    Name Relation Home Work Mobile   Lince,Dorriann Mother [phone]     Myra Gianotti   [phone]      Current Medical History  Patient Admitting Diagnosis: debility, Covid   History of Present Illness: Collin Brown is a 34 year old right-handed male with obstructive sleep apnea, morbid obesity BMI 51.88.  Presented 05/03/2020 with increasing shortness of breath as well as cough and low-grade fever.  Reportedly patient had recently traveled to [place] to visit his brother over the summer.   Patient found to be COVID-19 positive.  He was treated with IV remdesivir and IV steroids.  His condition did initially deteriorate and required intubation and ultimately self extubated on May 20, 2020 transitioned to BiPAP.   Hospital course complicated by acute metabolic encephalopathy.  CT/MRI showed no evidence of infarction or hemorrhage.  Patient did have some complaints of left shoulder pain with weakness and evaluated for possible brachial plexus mass CT of the chest completed showing no abnormality at the level of left brachial plexus.  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  He was placed on Lovenox for DVT prophylaxis.  Findings of elevated hemoglobin A1c 7.2 and placed on insulin therapy.  WOC follow-up for pressure injury gluteal cleft with wound care as directed.  He is tolerating a mechanical soft, thin liquid diet. Therapy evaluations completed and patient was recommended for a ,comprehensive rehab program    Patient's medical record from [facility] has been reviewed by the rehabilitation admission coordinator and physician.  Past Medical History  Past Medical History:  Diagnosis Date  . Hearing loss in right ear   . Sleep apnea     Family History   family history includes Diabetes Mellitus II in his father and mother; High blood pressure in his father; Hypertension in his mother.  Prior Rehab/Hospitalizations Has the patient had prior  rehab or hospitalizations prior to admission? No  Has the patient had major surgery during 100 days prior to admission? No   Current Medications  Current Facility-Administered Medications:  .  0.9 %  sodium chloride infusion, , Intravenous, PRN, Jennye Boroughs, MD, Stopped at 06/06/20 1215 .  acetaminophen (TYLENOL) tablet 1,000 mg, 1,000 mg, Oral, Q6H PRN, Oswald Hillock, RPH, 1,000 mg at 06/07/20 1027 .  ascorbic acid (VITAMIN C) tablet 500 mg, 500 mg, Oral, Daily, Jennye Boroughs, MD, 500 mg at 06/08/20 0936 .  aspirin chewable tablet 81 mg, 81 mg, Oral, Daily, Jennye Boroughs, MD, 81 mg at 06/08/20 0936 .  chlorhexidine (PERIDEX) 0.12 % solution 15 mL, 15 mL, Mouth  Rinse, BID, Jennye Boroughs, MD, 15 mL at 06/08/20 0936 .  Chlorhexidine Gluconate Cloth 2 % PADS 6 each, 6 each, Topical, Q0600, Jennye Boroughs, MD, 6 each at 06/08/20 [phone] .  collagenase (SANTYL) ointment, , Topical, Daily, Pokhrel, Laxman, MD, Given at 06/08/20 [phone] .  dextrose 50 % solution 0-50 mL, 0-50 mL, Intravenous, PRN, Jennye Boroughs, MD .  enoxaparin (LOVENOX) injection 80 mg, 0.5 mg/kg, Subcutaneous, Q24H, Oswald Hillock, RPH .  feeding supplement (NEPRO CARB STEADY) liquid 237 mL, 237 mL, Oral, TID BM, Jennye Boroughs, MD, Last Rate: 0 mL/hr at 05/29/20 2130, 237 mL at 06/08/20 0938 .  ibuprofen (ADVIL) tablet 600 mg, 600 mg, Oral, Q6H PRN, Athena Masse, MD, 600 mg at 05/28/20 0106 .  insulin aspart (novoLOG) injection 0-20 Units, 0-20 Units, Subcutaneous, TID WC, Pokhrel, Laxman, MD, 7 Units at 06/07/20 1658 .  insulin aspart (novoLOG) injection 0-5 Units, 0-5 Units, Subcutaneous, QHS, Pokhrel, Laxman, MD, 3 Units at 06/07/20 2150 .  insulin aspart (novoLOG) injection 4 Units, 4 Units, Subcutaneous, TID WC, Pokhrel, Laxman, MD, 4 Units at 06/08/20 0936 .  insulin detemir (LEVEMIR) injection 15 Units, 15 Units, Subcutaneous, BID, Bonnielee Haff, MD, 15 Units at 06/08/20 [phone] .  MEDLINE mouth rinse, 15 mL, Mouth Rinse, q12n4p, Jennye Boroughs, MD, 15 mL at 06/07/20 1757 .  metoprolol tartrate (LOPRESSOR) tablet 50 mg, 50 mg, Oral, BID, Bonnielee Haff, MD, 50 mg at 06/08/20 0938 .  morphine 2 MG/ML injection 1 mg, 1 mg, Intravenous, Q4H PRN, Jennye Boroughs, MD, 1 mg at 06/08/20 8119 .  multivitamin with minerals tablet 1 tablet, 1 tablet, Oral, Daily, Jennye Boroughs, MD, 1 tablet at 06/08/20 0936 .  neomycin-bacitracin-polymyxin (NEOSPORIN) ointment, , Topical, Daily, Pokhrel, Laxman, MD, Given at 06/08/20 [phone] .  polyethylene glycol (MIRALAX / GLYCOLAX) packet 17 g, 17 g, Oral, BID, Pokhrel, Laxman, MD, 17 g at 06/08/20 0936 .  senna (SENOKOT) tablet 8.6 mg, 1 tablet, Oral, BID,  Pokhrel, Laxman, MD, 8.6 mg at 06/08/20 0936 .  sodium chloride flush (NS) 0.9 % injection 10-40 mL, 10-40 mL, Intracatheter, PRN, Jennye Boroughs, MD, 10 mL at 05/30/20 1142  Patients Current Diet:  Diet Order            Diet Heart Room service appropriate? Yes with Assist; Fluid consistency: Thin  Diet effective now                 Precautions / Restrictions Precautions Precautions: Fall Precaution Comments: flaccid LUE (protect from subluxation) Restrictions Weight Bearing Restrictions: No   Has the patient had 2 or more falls or a fall with injury in the past year? 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 Independent  Indoor Mobility: Did the patient need assistance with walking from room to room (with or without device)? Independent  Stairs: Did the patient need assistance with internal or external stairs (with or without device)? Independent  Functional Cognition: Did the patient need help planning regular tasks such as shopping or remembering to take medications? Independent  Home Assistive Devices / Equipment Home Assistive Devices/Equipment: None Home Equipment: None  Prior Device Use: Indicate devices/aids used by the patient prior to current illness, exacerbation or injury? None of the above  Current Functional Level Cognition  Overall Cognitive Status: Within Functional Limits for tasks assessed Difficult to assess due to: Impaired communication Current Attention Level: Alternating, Divided Orientation Level: Oriented X4 Following Commands: Follows one step commands with increased time General Comments: Pt more talkative this session and carries on a conversation    Extremity Assessment (includes Sensation/Coordination)  Upper Extremity Assessment: RUE deficits/detail, LUE  deficits/detail RUE Deficits / Details: Grossly 4-/5  LUE Deficits / Details: 2-/5 wrist flexion/extension, digit flexion, and shoulder elevation. 0/5 elbow flexion/extension.  LUE Sensation: decreased light touch  Lower Extremity Assessment:  (see note for detail)    ADLs  Overall ADL's : Needs assistance/impaired Eating/Feeding: Bed level, With adaptive utensils, Set up, Supervision/ safety Eating/Feeding Details (indicate cue type and reason): supervision for safety to minimize risk of aspiration; HOB 90degr, pillows positioned to support RUE to support improved tolerance for self feeding. Red foam built up handle applied to spoon with noted improvement in quality of movement to scoop and bring to mouth and pt endorses improvement; pt able to eat approx 25% of meal after set up requiring assist for another 25% 2/2 fatigue Grooming: Wash/dry face, Bed level, Maximal assistance Grooming Details (indicate cue type and reason): max A to perform requiring rest breaks 2/2 significant fatigue General ADL Comments: SETUP + CGA tooth brushing and face washing seated EOB, MIN A washing hair - assist for back of head / thoroughness. MAX A for LBD at bed level     Mobility  Overal bed mobility: Needs Assistance Bed Mobility: Supine to Sit, Sit to Supine Rolling: Mod assist Supine to sit: Mod assist Sit to supine: +2 for safety/equipment, Mod assist General bed mobility comments: During supine to sit, pt is mod+1 as he needs help with trunk. Pt sat up on the L side of bed today, which is more difficult for patient. For sit to supine, pt was modA+2 as he needed assistance with trunk and LEs. Pt performed rolling to both sides with modA to straighten out bed linen    Transfers  Overall transfer level: Needs assistance Equipment used: 2 person hand held assist Transfer via Lift Equipment: Clarise Cruz Lift (Attempted STS lift to recliner, but Large size sling was not big enough. Unable to find XL  sling) Transfers: Sit to/from Stand Sit to Stand: +2 physical assistance, From elevated surface, Max assist General transfer comment: Pt attempted sit<>stand with maxA+2. However, unable to fully stand up as patient is fatigued    Ambulation / Gait / Stairs / Wheelchair Mobility  Ambulation/Gait General Gait Details: Did not attempt due to pt fatigued    Posture / Balance Dynamic Sitting Balance Sitting balance - Comments: Pt able to maintain balance once feet are supported, and demonstrates good balance while doing exercises Balance Overall balance assessment: Needs assistance Sitting-balance support: Feet supported Sitting balance-Leahy Scale: Good Sitting balance -  Comments: Pt able to maintain balance once feet are supported, and demonstrates good balance while doing exercises Postural control: Posterior lean, Right lateral lean Standing balance support: No upper extremity supported, During functional activity, Single extremity supported Standing balance-Leahy Scale: Fair Standing balance comment: Patient unable to stand this session    Special needs/care consideration Oxygen 2-5L, Diabetic management yes (new diabetes diagnosis) and Special service needs diabetic education   Previous Home Environment (from acute therapy documentation) Living Arrangements: Parent, Other (Comment) (Mother and 66yo nephew) Available Help at Discharge: Family Type of Home: House Home Layout: One level [place]: No Additional Comments: Pt unable to verbalize, therapist unable to read lips, unable to hold pen to write; mom's phone goes straight to voicemail; will verify as able  Discharge Living Setting Plans for Discharge Living Setting: Patient's home Type of Home at Discharge: House Discharge Home Layout: One level Discharge Home Access: Stairs to enter Entrance Stairs-Rails: Left Entrance Stairs-Number of Steps: 3 Discharge Bathroom Shower/Tub: Tub/shower unit Discharge Bathroom  Toilet: Standard Discharge Bathroom Accessibility: Yes How Accessible: Accessible via walker Does the patient have any problems obtaining your medications?: No  Social/Family/Support Systems Anticipated Caregiver: mom (Dorriann Lince) and nephew Anticipated Caregiver's Contact Information: Dorriann [phone] Ability/Limitations of Caregiver: supervision only from mom (nephew can provide more physical assist) Caregiver Availability: 24/7 Discharge Plan Discussed with Primary Caregiver: Yes Is Caregiver In Agreement with Plan?: Yes Does Caregiver/Family have Issues with Lodging/Transportation while Pt is in Rehab?: No  Goals Patient/Family Goal for Rehab: PT/OT supervision to mod I, limited household mobility, w/c for community likely, SLP n/a Expected length of stay: 16-19 days Additional Information: BiPap at night Pt/Family Agrees to Admission and willing to participate: Yes Program Orientation Provided & Reviewed with Pt/Caregiver Including Roles  & Responsibilities: Yes  Barriers to Discharge: Home environment access/layout, New oxygen, Insurance for SNF coverage  Decrease burden of Care through IP rehab admission: n/a  Possible need for SNF placement upon discharge: Not anticipated  Patient Condition: I have reviewed medical records from [facility], spoken with CM, and patient and family member. I discussed via phone for inpatient rehabilitation assessment.  Patient will benefit from ongoing PT and OT, can actively participate in 3 hours of therapy a day 5 days of the week, and can make measurable gains during the admission.  Patient will also benefit from the coordinated team approach during an Inpatient Acute Rehabilitation admission.  The patient will receive intensive therapy as well as Rehabilitation physician, nursing, social worker, and care management interventions.  Due to safety, skin/wound care, disease management, medication administration, pain management and patient education  the patient requires 24 hour a day rehabilitation nursing.  The patient is currently mod assist +2 with mobility and basic ADLs.  Discharge setting and therapy post discharge at home is anticipated.  Patient has agreed to participate in the Acute Inpatient Rehabilitation Program and will admit today.  Preadmission Screen Completed By:  Michel Santee, PT, DPT 06/08/2020 10:16 AM ______________________________________________________________________   Discussed status with Dr. Posey Pronto on 06/08/20  at 10:16 AM  and received approval for admission today.  Admission Coordinator:  Michel Santee, PT, DPT time 10:16 AM Sudie Grumbling 06/08/20    Assessment/Plan: Diagnosis: Debility  1. Does the need for close, 24 hr/day Medical supervision in concert with the patient's rehab needs make it unreasonable for this patient to be served in a less intensive setting? Yes  2. Co-Morbidities requiring supervision/potential complications: obstructive sleep apnea, morbid obesity (Body mass  index is 51.14 kg/m.,  diet and exercise education, cont to encourage weight loss to increase endurance and promote overall health), tachypnea (monitor RR and O2 Sats with increased physical exertion), steroid induced hyperglycemia (Monitor in accordance with exercise and adjust meds as necessary), leukocytosis (likely steroid induced, repeat labs, cont to monitor for signs and symptoms of infection, further workup if indicated), Thrombocytopenia (< 60,000/mm3 no resistive exercise) 3. Due to safety, disease management, pain management and patient education, does the patient require 24 hr/day rehab nursing? Yes 4. Does the patient require coordinated care of a physician, rehab nurse, PT, OT, to address physical and functional deficits in the context of the above medical diagnosis(es)? Yes Addressing deficits in the following areas: balance, endurance, locomotion, strength, transferring, bathing, dressing, toileting and psychosocial  support 5. Can the patient actively participate in an intensive therapy program of at least 3 hrs of therapy 5 days a week? Yes and Potentially 6. The potential for patient to make measurable gains while on inpatient rehab is excellent 7. Anticipated functional outcomes upon discharge from inpatient rehab: supervision PT, supervision OT, n/a SLP 8. Estimated rehab length of stay to reach the above functional goals is: 14-18 days. 9. Anticipated discharge destination: Home 10. Overall Rehab/Functional Prognosis: excellent   MD Signature: Delice Lesch, MD, ABPMR

## 2020-06-07 NOTE — Progress Notes (Signed)
Physical Therapy Treatment Patient Details Name: Collin Brown MRN: [id number] DOB: 12-28-1985 Today's Date: 06/07/2020    History of Present Illness Collin Brown is a 34 year old male who comes to [facility] on 9/23 after progression of dyspnea. Pt recently (+)COVID19 along with other family. Pt required intubation on 9/25, self extubated on 10/10. At evaluation pt presenting on HHHF @55L /min, 90%FiO2.    PT Comments    Pt presents in fowler's position on arrival to room and is agreeable to PT session. Patient is noticeably more fatigued today probably due to yesterday's session. During supine to sit, pt is mod+1 as he needs help with trunk. Pt sat up on the L side of bed today, which is more difficult for patient. Pt attempted sit<>stand with maxA+2. However, unable to fully stand up as patient is fatigued. Performed strengthening exercises of LE instead. Pt scoots towards HOB five times while sitting on the EOB requiring modA+2. For sit to supine, pt was modA+2 as he needed assistance with trunk and LEs. Pt able to scoot himself up higher in bed as he pushes off with BLE with bed in trendelenburg. He performs rolling to both sides with modA to straighten out bed linen. Patient is left in bed with all needs and nursing present to take vitals. Monitored vitals throughout session and listed below. Pt continues to benefit from skilled PT services to maximize return to PLOF and minimize risk of future falls, injury, caregiver burden, and readmission. Will continue to follow POC. Discharge recommendation remains appropriate.  Vitals: Seated: O2: 86% 2L, increased to 4L O2: 91% Supine: O2: 92% 4L, decreased to 2L O2: 92%   Follow Up Recommendations  CIR     Equipment Recommendations  Other (comment) (Defer to next session)    Recommendations for Other Services       Precautions / Restrictions Precautions Precautions: Fall Restrictions Weight Bearing Restrictions: No    Mobility  Bed  Mobility Overal bed mobility: Needs Assistance Bed Mobility: Supine to Sit;Sit to Supine Rolling: Mod assist   Supine to sit: Mod assist Sit to supine: +2 for safety/equipment;Mod assist   General bed mobility comments: During supine to sit, pt is mod+1 as he needs help with trunk. Pt sat up on the L side of bed today, which is more difficult for patient. For sit to supine, pt was modA+2 as he needed assistance with trunk and LEs. Pt performed rolling to both sides with modA to straighten out bed linen  Transfers Overall transfer level: Needs assistance Equipment used: 2 person hand held assist Transfers: Sit to/from Stand Sit to Stand: +2 physical assistance;From elevated surface;Max assist         General transfer comment: Pt attempted sit<>stand with maxA+2. However, unable to fully stand up as patient is fatigued  Ambulation/Gait             General Gait Details: Did not attempt due to pt fatigued   Stairs             Wheelchair Mobility    Modified Rankin (Stroke Patients Only)       Balance Overall balance assessment: Needs assistance Sitting-balance support: Feet supported Sitting balance-Leahy Scale: Good Sitting balance - Comments: Pt able to maintain balance once feet are supported, and demonstrates good balance while doing exercises       Standing balance comment: Patient unable to stand this session  Cognition Arousal/Alertness: Awake/alert Behavior During Therapy: WFL for tasks assessed/performed;Flat affect Overall Cognitive Status: Within Functional Limits for tasks assessed                                        Exercises General Exercises - Lower Extremity Long Arc Quad: Strengthening;Both;10 reps;Seated (manual resistance) Hip Flexion/Marching: Strengthening;Both;10 reps;Seated;AROM (On R manual resistance utilized, on L AROM) Other Exercises Other Exercises: Pt scoots towards  HOB five times while sitting on the EOB requiring modA+2. Pt able to scoot himself up higher in bed as he pushes off with BLE    General Comments        Pertinent Vitals/Pain Pain Assessment: No/denies pain    Home Living                      Prior Function            PT Goals (current goals can now be found in the care plan section) Acute Rehab PT Goals Patient Stated Goal: Pt wants to return home and back to work when ready PT Goal Formulation: With patient Time For Goal Achievement: 06/18/20 Potential to Achieve Goals: Fair Progress towards PT goals: Progressing toward goals    Frequency    Min 2X/week      PT Plan Current plan remains appropriate    Co-evaluation              AM-PAC PT "6 Clicks" Mobility   Outcome Measure  Help needed turning from your back to your side while in a flat bed without using bedrails?: A Lot Help needed moving from lying on your back to sitting on the side of a flat bed without using bedrails?: Total Help needed moving to and from a bed to a chair (including a wheelchair)?: A Lot Help needed standing up from a chair using your arms (e.g., wheelchair or bedside chair)?: A Lot Help needed to walk in hospital room?: Total Help needed climbing 3-5 steps with a railing? : Total 6 Click Score: 9    End of Session Equipment Utilized During Treatment: Gait belt;Oxygen Activity Tolerance: Patient limited by fatigue Patient left: in bed;with call bell/phone within reach;with bed alarm set;with nursing/sitter in room Nurse Communication: Mobility status PT Visit Diagnosis: Difficulty in walking, not elsewhere classified (R26.2);Other abnormalities of gait and mobility (R26.89);Muscle weakness (generalized) (M62.81)     Time: 9628-3662 PT Time Calculation (min) (ACUTE ONLY): 40 min  Charges:                        Collin Brown, SPT   Baker Pierini 06/07/2020, 4:41 PM

## 2020-06-07 NOTE — Progress Notes (Addendum)
Inpatient Rehab Admissions Coordinator:   Received authorization for pt to admit to CIR tomorrow, pending medical stability.  Will f/u with MD and pt tomorrow to confirm, bed availability.    Estill Dooms, PT, DPT Admissions Coordinator [phone] 06/07/20  12:26 PM

## 2020-06-07 NOTE — Progress Notes (Signed)
Inpatient Rehab Admissions Coordinator:   Insurance authorization pending for CIR. Paged Dr. Rito Ehrlich regarding transition to PO pain control.  Will continue to follow.   Estill Dooms, PT, DPT Admissions Coordinator [phone] 06/07/20  11:15 AM

## 2020-06-07 NOTE — Plan of Care (Signed)

## 2020-06-08 ENCOUNTER — Encounter (HOSPITAL_COMMUNITY): Payer: Self-pay | Admitting: Physical Medicine & Rehabilitation

## 2020-06-08 ENCOUNTER — Other Ambulatory Visit: Payer: Self-pay

## 2020-06-08 ENCOUNTER — Inpatient Hospital Stay (HOSPITAL_COMMUNITY)
Admission: RE | Admit: 2020-06-08 | Discharge: 2020-07-13 | DRG: 939 | Disposition: A | Discharge status: Home Health | Payer: BC Managed Care – PPO | Source: Intra-hospital | Attending: Physical Medicine & Rehabilitation | Admitting: Physical Medicine & Rehabilitation

## 2020-06-08 ENCOUNTER — Encounter: Payer: Self-pay | Admitting: Hospitalist

## 2020-06-08 DIAGNOSIS — Z8616 Personal history of COVID-19: Secondary | ICD-10-CM

## 2020-06-08 DIAGNOSIS — D62 Acute posthemorrhagic anemia: Secondary | ICD-10-CM

## 2020-06-08 DIAGNOSIS — E119 Type 2 diabetes mellitus without complications: Secondary | ICD-10-CM | POA: Diagnosis present

## 2020-06-08 DIAGNOSIS — L89892 Pressure ulcer of other site, stage 2: Secondary | ICD-10-CM | POA: Diagnosis present

## 2020-06-08 DIAGNOSIS — A419 Sepsis, unspecified organism: Secondary | ICD-10-CM | POA: Diagnosis not present

## 2020-06-08 DIAGNOSIS — A029 Salmonella infection, unspecified: Secondary | ICD-10-CM | POA: Diagnosis not present

## 2020-06-08 DIAGNOSIS — N390 Urinary tract infection, site not specified: Secondary | ICD-10-CM | POA: Diagnosis present

## 2020-06-08 DIAGNOSIS — K5903 Drug induced constipation: Secondary | ICD-10-CM | POA: Diagnosis not present

## 2020-06-08 DIAGNOSIS — U071 COVID-19: Secondary | ICD-10-CM | POA: Diagnosis not present

## 2020-06-08 DIAGNOSIS — R6520 Severe sepsis without septic shock: Secondary | ICD-10-CM | POA: Diagnosis not present

## 2020-06-08 DIAGNOSIS — I10 Essential (primary) hypertension: Secondary | ICD-10-CM | POA: Diagnosis present

## 2020-06-08 DIAGNOSIS — Z6841 Body Mass Index (BMI) 40.0 and over, adult: Secondary | ICD-10-CM | POA: Diagnosis not present

## 2020-06-08 DIAGNOSIS — E785 Hyperlipidemia, unspecified: Secondary | ICD-10-CM | POA: Diagnosis present

## 2020-06-08 DIAGNOSIS — L89154 Pressure ulcer of sacral region, stage 4: Secondary | ICD-10-CM | POA: Diagnosis present

## 2020-06-08 DIAGNOSIS — G540 Brachial plexus disorders: Secondary | ICD-10-CM

## 2020-06-08 DIAGNOSIS — I959 Hypotension, unspecified: Secondary | ICD-10-CM | POA: Diagnosis not present

## 2020-06-08 DIAGNOSIS — Z9981 Dependence on supplemental oxygen: Secondary | ICD-10-CM | POA: Diagnosis not present

## 2020-06-08 DIAGNOSIS — G7281 Critical illness myopathy: Secondary | ICD-10-CM | POA: Diagnosis present

## 2020-06-08 DIAGNOSIS — Z794 Long term (current) use of insulin: Secondary | ICD-10-CM

## 2020-06-08 DIAGNOSIS — M792 Neuralgia and neuritis, unspecified: Secondary | ICD-10-CM

## 2020-06-08 DIAGNOSIS — I152 Hypertension secondary to endocrine disorders: Secondary | ICD-10-CM | POA: Diagnosis present

## 2020-06-08 DIAGNOSIS — R509 Fever, unspecified: Secondary | ICD-10-CM

## 2020-06-08 DIAGNOSIS — Z7409 Other reduced mobility: Secondary | ICD-10-CM | POA: Diagnosis present

## 2020-06-08 DIAGNOSIS — B961 Klebsiella pneumoniae [K. pneumoniae] as the cause of diseases classified elsewhere: Secondary | ICD-10-CM | POA: Diagnosis present

## 2020-06-08 DIAGNOSIS — Z7982 Long term (current) use of aspirin: Secondary | ICD-10-CM | POA: Diagnosis not present

## 2020-06-08 DIAGNOSIS — Z79899 Other long term (current) drug therapy: Secondary | ICD-10-CM

## 2020-06-08 DIAGNOSIS — E1169 Type 2 diabetes mellitus with other specified complication: Secondary | ICD-10-CM | POA: Diagnosis not present

## 2020-06-08 DIAGNOSIS — S143XXS Injury of brachial plexus, sequela: Secondary | ICD-10-CM | POA: Diagnosis not present

## 2020-06-08 DIAGNOSIS — J1282 Pneumonia due to coronavirus disease 2019: Secondary | ICD-10-CM | POA: Diagnosis not present

## 2020-06-08 DIAGNOSIS — R5381 Other malaise: Secondary | ICD-10-CM | POA: Diagnosis present

## 2020-06-08 DIAGNOSIS — G4733 Obstructive sleep apnea (adult) (pediatric): Secondary | ICD-10-CM | POA: Diagnosis not present

## 2020-06-08 DIAGNOSIS — E1159 Type 2 diabetes mellitus with other circulatory complications: Secondary | ICD-10-CM | POA: Diagnosis present

## 2020-06-08 DIAGNOSIS — L89153 Pressure ulcer of sacral region, stage 3: Secondary | ICD-10-CM

## 2020-06-08 DIAGNOSIS — E876 Hypokalemia: Secondary | ICD-10-CM

## 2020-06-08 DIAGNOSIS — S31000D Unspecified open wound of lower back and pelvis without penetration into retroperitoneum, subsequent encounter: Secondary | ICD-10-CM | POA: Diagnosis not present

## 2020-06-08 DIAGNOSIS — E662 Morbid (severe) obesity with alveolar hypoventilation: Secondary | ICD-10-CM | POA: Diagnosis present

## 2020-06-08 DIAGNOSIS — U099 Post covid-19 condition, unspecified: Secondary | ICD-10-CM | POA: Diagnosis present

## 2020-06-08 HISTORY — DX: Essential (primary) hypertension: I10

## 2020-06-08 HISTORY — DX: Acute posthemorrhagic anemia: D62

## 2020-06-08 HISTORY — DX: Type 2 diabetes mellitus without complications: E11.9

## 2020-06-08 LAB — CBC WITH DIFFERENTIAL/PLATELET
Abs Immature Granulocytes: 0.26 10*3/uL — ABNORMAL HIGH (ref 0.00–0.07)
Basophils Absolute: 0 10*3/uL (ref 0.0–0.1)
Basophils Relative: 0 %
Eosinophils Absolute: 0 10*3/uL (ref 0.0–0.5)
Eosinophils Relative: 0 %
HCT: 35.8 % — ABNORMAL LOW (ref 39.0–52.0)
Hemoglobin: 11.3 g/dL — ABNORMAL LOW (ref 13.0–17.0)
Immature Granulocytes: 2 %
Lymphocytes Relative: 17 %
Lymphs Abs: 1.9 10*3/uL (ref 0.7–4.0)
MCH: 28.4 pg (ref 26.0–34.0)
MCHC: 31.6 g/dL (ref 30.0–36.0)
MCV: 89.9 fL (ref 80.0–100.0)
Monocytes Absolute: 1 10*3/uL (ref 0.1–1.0)
Monocytes Relative: 9 %
Neutro Abs: 8.1 10*3/uL — ABNORMAL HIGH (ref 1.7–7.7)
Neutrophils Relative %: 72 %
Platelets: 136 10*3/uL — ABNORMAL LOW (ref 150–400)
RBC: 3.98 MIL/uL — ABNORMAL LOW (ref 4.22–5.81)
RDW: 18.1 % — ABNORMAL HIGH (ref 11.5–15.5)
WBC: 11.2 10*3/uL — ABNORMAL HIGH (ref 4.0–10.5)
nRBC: 0.4 % — ABNORMAL HIGH (ref 0.0–0.2)

## 2020-06-08 LAB — BASIC METABOLIC PANEL
Anion gap: 8 (ref 5–15)
BUN: 32 mg/dL — ABNORMAL HIGH (ref 6–20)
CO2: 30 mmol/L (ref 22–32)
Calcium: 9.2 mg/dL (ref 8.9–10.3)
Chloride: 99 mmol/L (ref 98–111)
Creatinine, Ser: <0.3 mg/dL — ABNORMAL LOW (ref 0.61–1.24)
Glucose, Bld: 146 mg/dL — ABNORMAL HIGH (ref 70–99)
Potassium: 3.9 mmol/L (ref 3.5–5.1)
Sodium: 137 mmol/L (ref 135–145)

## 2020-06-08 LAB — GLUCOSE, CAPILLARY
Glucose-Capillary: 96 mg/dL (ref 70–99)
Glucose-Capillary: 116 mg/dL — ABNORMAL HIGH (ref 70–99)
Glucose-Capillary: 162 mg/dL — ABNORMAL HIGH (ref 70–99)

## 2020-06-08 MED ORDER — SENNA 8.6 MG PO TABS
1.0000 | ORAL_TABLET | Freq: Two times a day (BID) | ORAL | Status: DC
  Administered 2020-06-08 – 2020-06-15 (×13): 8.6 mg via ORAL
  Filled 2020-06-08 (×17): qty 1

## 2020-06-08 MED ORDER — INSULIN ASPART 100 UNIT/ML ~~LOC~~ SOLN
0.0000 [IU] | Freq: Three times a day (TID) | SUBCUTANEOUS | Status: DC
  Administered 2020-06-09 – 2020-06-22 (×11): 3 [IU] via SUBCUTANEOUS
  Administered 2020-06-26: 4 [IU] via SUBCUTANEOUS
  Administered 2020-06-27 – 2020-06-30 (×4): 3 [IU] via SUBCUTANEOUS
  Administered 2020-07-01: 4 [IU] via SUBCUTANEOUS
  Administered 2020-07-02 – 2020-07-11 (×8): 3 [IU] via SUBCUTANEOUS

## 2020-06-08 MED ORDER — POLYETHYLENE GLYCOL 3350 17 G PO PACK
17.0000 g | PACK | Freq: Two times a day (BID) | ORAL | Status: DC
  Administered 2020-06-08 – 2020-06-11 (×3): 17 g via ORAL
  Filled 2020-06-08 (×15): qty 1

## 2020-06-08 MED ORDER — ASPIRIN 81 MG PO CHEW
81.0000 mg | CHEWABLE_TABLET | Freq: Every day | ORAL | Status: DC
  Administered 2020-06-09 – 2020-06-28 (×20): 81 mg via ORAL
  Filled 2020-06-08 (×20): qty 1

## 2020-06-08 MED ORDER — ASCORBIC ACID 500 MG PO TABS
500.0000 mg | ORAL_TABLET | Freq: Every day | ORAL | Status: DC
  Administered 2020-06-09 – 2020-07-13 (×35): 500 mg via ORAL
  Filled 2020-06-08 (×35): qty 1

## 2020-06-08 MED ORDER — ACETAMINOPHEN 500 MG PO TABS
1000.0000 mg | ORAL_TABLET | Freq: Four times a day (QID) | ORAL | Status: DC | PRN
  Administered 2020-06-16 – 2020-07-11 (×32): 1000 mg via ORAL
  Filled 2020-06-08 (×35): qty 2

## 2020-06-08 MED ORDER — OXYCODONE HCL 5 MG PO TABS
5.0000 mg | ORAL_TABLET | ORAL | Status: DC | PRN
  Administered 2020-06-08 – 2020-06-13 (×17): 10 mg via ORAL
  Administered 2020-06-13: 5 mg via ORAL
  Administered 2020-06-13 – 2020-06-25 (×47): 10 mg via ORAL
  Filled 2020-06-08 (×61): qty 2
  Filled 2020-06-08: qty 1
  Filled 2020-06-08 (×6): qty 2

## 2020-06-08 MED ORDER — ENOXAPARIN SODIUM 80 MG/0.8ML ~~LOC~~ SOLN: 80.0000 mg | SUBCUTANEOUS | Status: DC

## 2020-06-08 MED ORDER — BACITRACIN-NEOMYCIN-POLYMYXIN OINTMENT TUBE
TOPICAL_OINTMENT | Freq: Every day | CUTANEOUS | Status: AC
  Filled 2020-06-08: qty 14

## 2020-06-08 MED ORDER — ADULT MULTIVITAMIN W/MINERALS CH
1.0000 | ORAL_TABLET | Freq: Every day | ORAL | Status: DC
  Administered 2020-06-09 – 2020-07-13 (×35): 1 via ORAL
  Filled 2020-06-08 (×35): qty 1

## 2020-06-08 MED ORDER — INSULIN ASPART 100 UNIT/ML ~~LOC~~ SOLN
4.0000 [IU] | Freq: Three times a day (TID) | SUBCUTANEOUS | Status: DC
  Administered 2020-06-08 – 2020-07-12 (×69): 4 [IU] via SUBCUTANEOUS

## 2020-06-08 MED ORDER — ENOXAPARIN SODIUM 80 MG/0.8ML ~~LOC~~ SOLN
0.5000 mg/kg | SUBCUTANEOUS | Status: DC
  Administered 2020-06-08 – 2020-06-27 (×20): 80 mg via SUBCUTANEOUS
  Filled 2020-06-08 (×20): qty 0.8

## 2020-06-08 MED ORDER — NEPRO/CARBSTEADY PO LIQD
237.0000 mL | Freq: Three times a day (TID) | ORAL | Status: DC
  Administered 2020-06-08 – 2020-06-29 (×32): 237 mL via ORAL

## 2020-06-08 MED ORDER — METOPROLOL TARTRATE 50 MG PO TABS
50.0000 mg | ORAL_TABLET | Freq: Two times a day (BID) | ORAL | Status: DC
  Administered 2020-06-08 – 2020-06-13 (×10): 50 mg via ORAL
  Filled 2020-06-08 (×10): qty 1

## 2020-06-08 MED ORDER — ENOXAPARIN SODIUM 80 MG/0.8ML ~~LOC~~ SOLN: 0.5000 mg/kg | SUBCUTANEOUS | Status: DC

## 2020-06-08 MED ORDER — GABAPENTIN 100 MG PO CAPS
100.0000 mg | ORAL_CAPSULE | Freq: Three times a day (TID) | ORAL | Status: DC
  Administered 2020-06-08 – 2020-06-12 (×12): 100 mg via ORAL
  Filled 2020-06-08 (×12): qty 1

## 2020-06-08 MED ORDER — INSULIN DETEMIR 100 UNIT/ML ~~LOC~~ SOLN
15.0000 [IU] | Freq: Two times a day (BID) | SUBCUTANEOUS | Status: DC
  Administered 2020-06-08 – 2020-07-13 (×68): 15 [IU] via SUBCUTANEOUS
  Filled 2020-06-08 (×74): qty 0.15

## 2020-06-08 MED ORDER — IBUPROFEN 400 MG PO TABS
600.0000 mg | ORAL_TABLET | Freq: Four times a day (QID) | ORAL | Status: AC | PRN
  Administered 2020-06-15: 600 mg via ORAL
  Filled 2020-06-08: qty 2

## 2020-06-08 MED ORDER — COLLAGENASE 250 UNIT/GM EX OINT
TOPICAL_OINTMENT | Freq: Every day | CUTANEOUS | Status: DC
  Filled 2020-06-08: qty 60
  Filled 2020-06-08: qty 30

## 2020-06-08 NOTE — Progress Notes (Signed)
Inpatient Rehabilitation Medication Review by a Pharmacist  A complete drug regimen review was completed for this patient to identify any potential clinically significant medication issues.  Clinically significant medication issues were identified:  no  Check AMION for pharmacist assigned to patient if future medication questions/issues arise during this admission.  Pharmacist comments:   Time spent performing this drug regimen review (minutes):  10 minutes   Susa Griffins Dohlen 06/08/2020 1:41 PM

## 2020-06-08 NOTE — Progress Notes (Signed)
PMR Admission Coordinator Pre-Admission Assessment  Patient: Collin Brown is an 34 y.o., male MRN: [id number] DOB: [birth year] Height: '5\' 10"'  (177.8 cm) Weight: (!) 161.7 kg  Insurance Information HMO:     PPO: yes     PCP:      IPA:      80/20:      OTHER:  PRIMARY: [employer]      Policy#: [id number]      Subscriber: pt CM Name: Nira Conn       Phone#: [phone] [id number]     Fax#: [phone] Pre-Cert#: [id number] auth for CIR admit on 10/29 provided by Nira Conn with BCBS of [place] with updates due to fax listed above on 11/5.       Employer:  Benefits:  Phone #: [phone]     Name:  Eff. Date: 08/12/19     Deduct: $3000 (met)      Out of Pocket Max: [phone] (met [phone])      Life Max:  CIR: 75%      SNF: 75% Outpatient: 75%     Co-Ins: 25% Home Health: 75%      Co-Ins: 25% DME: 75%     Co-Ins: 25% Providers: preferred network SECONDARY:       Policy#:      Phone#:   Development worker, community:       Phone#:   The Engineer, petroleum" for patients in Inpatient Rehabilitation Facilities with attached "Privacy Act [place] Records" was provided and verbally reviewed with: N/A  Emergency Contact Information         Contact Information    Name Relation Home Work Mobile   Lince,Dorriann Mother [phone]     Myra Gianotti   [phone]      Current Medical History  Patient Admitting Diagnosis: debility, Covid   History of Present Illness: Collin Brown a 34 year old right-handed male with obstructive sleep apnea, morbid obesity BMI 51.88. Presented 05/03/2020 with increasing shortness of breath as well as cough and low-grade fever. Reportedly patient had recently traveled to [place] to visit his brother over the summer.  Patient found to be COVID-19 positive. He was treated with IV remdesivirand IV steroids. His conditiondid initially deteriorate and required intubation and ultimately self extubated on May 20, 2020  transitioned to BiPAP. Hospital course complicated by acute metabolic encephalopathy.CT/MRI showed no evidence of infarction or hemorrhage. Patient did have some complaints of left shoulder pain with weakness and evaluated for possible brachial plexus mass CT of the chest completed showing no abnormality at the level of left brachial plexus. 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 He was placed on Lovenox for DVT prophylaxis. Findings of elevated hemoglobin A1c 7.2 and placed on insulin therapy.  WOCfollow-up for pressure injury gluteal cleft with wound care as directed. He is tolerating a mechanical soft, thin liquid diet. Therapy evaluations completed and patient was recommended for a ,comprehensive rehab program  Patient's medical record from [facility] has been reviewed by the rehabilitation admission coordinator and physician.  Past Medical History      Past Medical History:  Diagnosis Date  . Hearing loss in right ear   . Sleep apnea     Family History   family history includes Diabetes Mellitus II in his father and mother; High blood pressure in his father; Hypertension in his mother.  Prior Rehab/Hospitalizations Has the patient had prior rehab or hospitalizations prior to admission? No  Has the patient had major surgery during 100 days prior to admission? No              Current Medications  Current Facility-Administered Medications:  .  0.9 %  sodium chloride infusion, , Intravenous, PRN, Jennye Boroughs, MD, Stopped at 06/06/20 1215 .  acetaminophen (TYLENOL) tablet 1,000 mg, 1,000 mg, Oral, Q6H PRN, Oswald Hillock, RPH, 1,000 mg at 06/07/20 1027 .  ascorbic acid (VITAMIN C) tablet 500 mg, 500 mg, Oral, Daily, Jennye Boroughs, MD, 500 mg at 06/08/20 0936 .  aspirin chewable tablet 81 mg, 81 mg, Oral, Daily, Jennye Boroughs, MD, 81 mg at 06/08/20 0936 .  chlorhexidine  (PERIDEX) 0.12 % solution 15 mL, 15 mL, Mouth Rinse, BID, Jennye Boroughs, MD, 15 mL at 06/08/20 0936 .  Chlorhexidine Gluconate Cloth 2 % PADS 6 each, 6 each, Topical, Q0600, Jennye Boroughs, MD, 6 each at 06/08/20 [phone] .  collagenase (SANTYL) ointment, , Topical, Daily, Pokhrel, Laxman, MD, Given at 06/08/20 [phone] .  dextrose 50 % solution 0-50 mL, 0-50 mL, Intravenous, PRN, Jennye Boroughs, MD .  enoxaparin (LOVENOX) injection 80 mg, 0.5 mg/kg, Subcutaneous, Q24H, Oswald Hillock, RPH .  feeding supplement (NEPRO CARB STEADY) liquid 237 mL, 237 mL, Oral, TID BM, Jennye Boroughs, MD, Last Rate: 0 mL/hr at 05/29/20 2130, 237 mL at 06/08/20 0938 .  ibuprofen (ADVIL) tablet 600 mg, 600 mg, Oral, Q6H PRN, Athena Masse, MD, 600 mg at 05/28/20 0106 .  insulin aspart (novoLOG) injection 0-20 Units, 0-20 Units, Subcutaneous, TID WC, Pokhrel, Laxman, MD, 7 Units at 06/07/20 1658 .  insulin aspart (novoLOG) injection 0-5 Units, 0-5 Units, Subcutaneous, QHS, Pokhrel, Laxman, MD, 3 Units at 06/07/20 2150 .  insulin aspart (novoLOG) injection 4 Units, 4 Units, Subcutaneous, TID WC, Pokhrel, Laxman, MD, 4 Units at 06/08/20 0936 .  insulin detemir (LEVEMIR) injection 15 Units, 15 Units, Subcutaneous, BID, Bonnielee Haff, MD, 15 Units at 06/08/20 [phone] .  MEDLINE mouth rinse, 15 mL, Mouth Rinse, q12n4p, Jennye Boroughs, MD, 15 mL at 06/07/20 1757 .  metoprolol tartrate (LOPRESSOR) tablet 50 mg, 50 mg, Oral, BID, Bonnielee Haff, MD, 50 mg at 06/08/20 0938 .  morphine 2 MG/ML injection 1 mg, 1 mg, Intravenous, Q4H PRN, Jennye Boroughs, MD, 1 mg at 06/08/20 2297 .  multivitamin with minerals tablet 1 tablet, 1 tablet, Oral, Daily, Jennye Boroughs, MD, 1 tablet at 06/08/20 0936 .  neomycin-bacitracin-polymyxin (NEOSPORIN) ointment, , Topical, Daily, Pokhrel, Laxman, MD, Given at 06/08/20 [phone] .  polyethylene glycol (MIRALAX / GLYCOLAX) packet 17 g, 17 g, Oral, BID, Pokhrel, Laxman, MD, 17 g at 06/08/20 0936 .  senna  (SENOKOT) tablet 8.6 mg, 1 tablet, Oral, BID, Pokhrel, Laxman, MD, 8.6 mg at 06/08/20 0936 .  sodium chloride flush (NS) 0.9 % injection 10-40 mL, 10-40 mL, Intracatheter, PRN, Jennye Boroughs, MD, 10 mL at 05/30/20 1142  Patients Current Diet:     Diet Order                  Diet Heart Room service appropriate? Yes with Assist; Fluid consistency: Thin  Diet effective now                  Precautions / Restrictions Precautions Precautions: Fall Precaution Comments: flaccid LUE (protect from subluxation) Restrictions Weight Bearing Restrictions: No   Has the patient had 2 or more falls or a fall with injury in the past year? 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 Independent  Indoor Mobility: Did the patient need assistance with walking from room to room (with or without device)? Independent  Stairs: Did the patient need assistance with internal or external stairs (with or without device)? Independent  Functional Cognition: Did the patient need help planning regular tasks such as shopping or remembering to take medications? Independent  Home Assistive Devices / Equipment Home Assistive Devices/Equipment: None Home Equipment: None  Prior Device Use: Indicate devices/aids used by the patient prior to current illness, exacerbation or injury? None of the above  Current Functional Level Cognition  Overall Cognitive Status: Within Functional Limits for tasks assessed Difficult to assess due to: Impaired communication Current Attention Level: Alternating, Divided Orientation Level: Oriented X4 Following Commands: Follows one step commands with increased time General Comments: Pt more talkative this session and carries on a conversation    Extremity Assessment (includes  Sensation/Coordination)  Upper Extremity Assessment: RUE deficits/detail, LUE deficits/detail RUE Deficits / Details: Grossly 4-/5  LUE Deficits / Details: 2-/5 wrist flexion/extension, digit flexion, and shoulder elevation. 0/5 elbow flexion/extension.  LUE Sensation: decreased light touch  Lower Extremity Assessment:  (see note for detail)    ADLs  Overall ADL's : Needs assistance/impaired Eating/Feeding: Bed level, With adaptive utensils, Set up, Supervision/ safety Eating/Feeding Details (indicate cue type and reason): supervision for safety to minimize risk of aspiration; HOB 90degr, pillows positioned to support RUE to support improved tolerance for self feeding. Red foam built up handle applied to spoon with noted improvement in quality of movement to scoop and bring to mouth and pt endorses improvement; pt able to eat approx 25% of meal after set up requiring assist for another 25% 2/2 fatigue Grooming: Wash/dry face, Bed level, Maximal assistance Grooming Details (indicate cue type and reason): max A to perform requiring rest breaks 2/2 significant fatigue General ADL Comments: SETUP + CGA tooth brushing and face washing seated EOB, MIN A washing hair - assist for back of head / thoroughness. MAX A for LBD at bed level     Mobility  Overal bed mobility: Needs Assistance Bed Mobility: Supine to Sit, Sit to Supine Rolling: Mod assist Supine to sit: Mod assist Sit to supine: +2 for safety/equipment, Mod assist General bed mobility comments: During supine to sit, pt is mod+1 as he needs help with trunk. Pt sat up on the L side of bed today, which is more difficult for patient. For sit to supine, pt was modA+2 as he needed assistance with trunk and LEs. Pt performed rolling to both sides with modA to straighten out bed linen    Transfers  Overall transfer level: Needs assistance Equipment used: 2 person hand held assist Transfer via Lift Equipment: Clarise Cruz Lift (Attempted STS lift  to recliner, but Large size sling was not big enough. Unable to find XL sling) Transfers: Sit to/from Stand Sit to Stand: +2 physical assistance, From elevated surface, Max assist General transfer comment: Pt attempted sit<>stand with maxA+2. However, unable to fully stand up as patient is fatigued    Ambulation / Gait / Stairs / Wheelchair Mobility  Ambulation/Gait General Gait Details: Did not attempt due to pt fatigued    Posture / Balance Dynamic Sitting Balance Sitting balance - Comments: Pt able to maintain balance once feet are supported, and demonstrates good balance while doing exercises Balance Overall balance assessment:  Needs assistance Sitting-balance support: Feet supported Sitting balance-Leahy Scale: Good Sitting balance - Comments: Pt able to maintain balance once feet are supported, and demonstrates good balance while doing exercises Postural control: Posterior lean, Right lateral lean Standing balance support: No upper extremity supported, During functional activity, Single extremity supported Standing balance-Leahy Scale: Fair Standing balance comment: Patient unable to stand this session    Special needs/care consideration Oxygen 2-5L, Diabetic management yes (new diabetes diagnosis) and Special service needs diabetic education   Previous Home Environment (from acute therapy documentation) Living Arrangements: Parent, Other (Comment) (Mother and 76yo nephew) Available Help at Discharge: Family Type of Home: House Home Layout: One level [place]: No Additional Comments: Pt unable to verbalize, therapist unable to read lips, unable to hold pen to write; mom's phone goes straight to voicemail; will verify as able  Discharge Living Setting Plans for Discharge Living Setting: Patient's home Type of Home at Discharge: House Discharge Home Layout: One level Discharge Home Access: Stairs to enter Entrance Stairs-Rails: Left Entrance Stairs-Number of  Steps: 3 Discharge Bathroom Shower/Tub: Tub/shower unit Discharge Bathroom Toilet: Standard Discharge Bathroom Accessibility: Yes How Accessible: Accessible via walker Does the patient have any problems obtaining your medications?: No  Social/Family/Support Systems Anticipated Caregiver: mom (Dorriann Lince) and nephew Anticipated Caregiver's Contact Information: Dorriann [phone] Ability/Limitations of Caregiver: supervision only from mom (nephew can provide more physical assist) Caregiver Availability: 24/7 Discharge Plan Discussed with Primary Caregiver: Yes Is Caregiver In Agreement with Plan?: Yes Does Caregiver/Family have Issues with Lodging/Transportation while Pt is in Rehab?: No  Goals Patient/Family Goal for Rehab: PT/OT supervision to mod I, limited household mobility, w/c for community likely, SLP n/a Expected length of stay: 16-19 days Additional Information: BiPap at night Pt/Family Agrees to Admission and willing to participate: Yes Program Orientation Provided & Reviewed with Pt/Caregiver Including Roles  & Responsibilities: Yes  Barriers to Discharge: Home environment access/layout, New oxygen, Insurance for SNF coverage  Decrease burden of Care through IP rehab admission: n/a  Possible need for SNF placement upon discharge: Not anticipated  Patient Condition: I have reviewed medical records from [facility], spoken with CM, and patient and family member. I discussed via phone for inpatient rehabilitation assessment.  Patient will benefit from ongoing PT and OT, can actively participate in 3 hours of therapy a day 5 days of the week, and can make measurable gains during the admission.  Patient will also benefit from the coordinated team approach during an Inpatient Acute Rehabilitation admission.  The patient will receive intensive therapy as well as Rehabilitation physician, nursing, social worker, and care management interventions.  Due to safety, skin/wound care,  disease management, medication administration, pain management and patient education the patient requires 24 hour a day rehabilitation nursing.  The patient is currently mod assist +2 with mobility and basic ADLs.  Discharge setting and therapy post discharge at home is anticipated.  Patient has agreed to participate in the Acute Inpatient Rehabilitation Program and will admit today.  Preadmission Screen Completed By:  Michel Santee, PT, DPT 06/08/2020 10:16 AM ______________________________________________________________________   Discussed status with Dr. Posey Pronto on 06/08/20  at 10:16 AM  and received approval for admission today.  Admission Coordinator:  Michel Santee, PT, DPT time 10:16 AM Sudie Grumbling 06/08/20    Assessment/Plan: Diagnosis: Debility  1. Does the need for close, 24 hr/day Medical supervision in concert with the patient's rehab needs make it unreasonable for this patient to be served in a less intensive setting? Yes  2. Co-Morbidities requiring supervision/potential complications: obstructive sleep apnea, morbid obesity (Body mass index is 51.14 kg/m.,  diet and exercise education, cont to encourage weight loss to increase endurance and promote overall health), tachypnea (monitor RR and O2 Sats with increased physical exertion), steroid induced hyperglycemia (Monitor in accordance with exercise and adjust meds as necessary), leukocytosis (likely steroid induced, repeat labs, cont to monitor for signs and symptoms of infection, further workup if indicated), Thrombocytopenia (< 60,000/mm3 no resistive exercise) 3. Due to safety, disease management, pain management and patient education, does the patient require 24 hr/day rehab nursing? Yes 4. Does the patient require coordinated care of a physician, rehab nurse, PT, OT, to address physical and functional deficits in the context of the above medical diagnosis(es)? Yes Addressing deficits in the following areas: balance, endurance,  locomotion, strength, transferring, bathing, dressing, toileting and psychosocial support 5. Can the patient actively participate in an intensive therapy program of at least 3 hrs of therapy 5 days a week? Yes and Potentially 6. The potential for patient to make measurable gains while on inpatient rehab is excellent 7. Anticipated functional outcomes upon discharge from inpatient rehab: supervision PT, supervision OT, n/a SLP 8. Estimated rehab length of stay to reach the above functional goals is: 14-18 days. 9. Anticipated discharge destination: Home 10. Overall Rehab/Functional Prognosis: excellent   MD Signature: Delice Lesch, MD, ABPMR

## 2020-06-08 NOTE — Progress Notes (Signed)
   Patient Details  Name: Collin Brown MRN: [id number] Date of Birth: [birth year]  Today's Date: 06/08/2020  Hospital Problems: Principal Problem:   Critical illness myopathy Active Problems:   Debility  Past Medical History:  Past Medical History:  Diagnosis Date  . Critical illness myopathy   . Diabetes mellitus without complication ([place])   . Hearing loss in right ear   . Hypertension   . Sleep apnea    Past Surgical History:  Past Surgical History:  Procedure Laterality Date  . TONSILLECTOMY     Social History:  reports that he has never smoked. He has never used smokeless tobacco. He reports that he does not drink alcohol and does not use drugs.  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 Education: BA- Patent attorney Read: Yes Write: Yes Employment Status: Employed Name of Employer: [employer]ologist, prison and probation services of Employment: 5 Return to Work Plans: Plans to return to work. Pt states he currently resides STD through employer. Legal History/Current Legal Issues: Denies Guardian/Conservator: N/A   Abuse/Neglect Abuse/Neglect Assessment Can Be Completed: Yes Physical Abuse: Denies Verbal Abuse: Denies Sexual Abuse: Denies Exploitation of patient/patient's resources: Denies Self-Neglect: Denies  Emotional Status Pt's affect, behavior and adjustment status: Pt in good spirits at time of visit Recent Psychosocial Issues:  Denies Psychiatric History: Denies Substance Abuse History: Denies  Patient / Family Perceptions, Expectations & Goals Pt/Family understanding of illness & functional limitations: Pt has general understanding of care needs Premorbid pt/family roles/activities: Independent Anticipated changes in roles/activities/participation: Assistance with ADLs/IADLs Pt/family expectations/goals: Pt goal is to "get self back on feet; being able to walk; get R hand to stop form shaking" (he is unable to move his left hand)  [employer]: None Premorbid Home Care/DME Agencies: None Transportation available at discharge: family Resource referrals recommended: Neuropsychology  Discharge Planning Living Arrangements: Parent, Other relatives Support Systems: Parent, Other relatives Type of Residence: Private residence Insurance Resources: Nurse, mental health (specify name) Nurse, mental health) Financial Resources: Employment, Other (Comment) (short term disability) Financial Screen Referred: No Living Expenses: Lives with family Money Management: Patient Does the patient have any problems obtaining your medications?: No Home Management: Pt and mother manage housekeeping/meals Patient/Family Preliminary Plans: TBD Care Coordinator Barriers to Discharge: Decreased caregiver support, Lack of/limited family support Care Coordinator Anticipated Follow Up Needs: HH/OP  Clinical Impression SW met with pt in room at bedside to introduce self, explain role, and discuss discharge process. Pt is not a English as a second language teacher. No HCPOA. No DME. Pt is new to o2. Pt is ok with SW calling his mother to discuss discharge plan. Pt reports he is to return to work on December 5th per his STD forms completed.   Rana Snare 06/08/2020, 5:57 PM

## 2020-06-08 NOTE — Plan of Care (Signed)

## 2020-06-08 NOTE — Plan of Care (Signed)
  Problem: Education: Goal: Knowledge of General Education information will improve Description: Including pain rating scale, medication(s)/side effects and non-pharmacologic comfort measures Outcome: Progressing   Problem: Activity: Goal: Risk for activity intolerance will decrease Outcome: Progressing   Problem: Health Behavior/Discharge Planning: Goal: Ability to manage health-related needs will improve Outcome: Progressing   Problem: Nutrition: Goal: Adequate nutrition will be maintained Outcome: Progressing   Problem: Skin Integrity: Goal: Risk for impaired skin integrity will decrease Outcome: Progressing

## 2020-06-08 NOTE — Discharge Summary (Signed)
[facility]  Physician Discharge Summary   Patient ID: Collin Brown MRN: [id number] DOB/AGE: [birth year] 34 y.o.  Admit date: 05/03/2020 Discharge date: 06/08/2020  PCP: Patient, No Pcp Per   PATIENT BEING DISCHARGED TO [place] [facility]  DISCHARGE DIAGNOSES:  Left upper extremity weakness, etiology unclear Acute respiratory failure with hypoxia, improved Pneumonia due to COVID-19 with ARDS Diabetes mellitus type 2, uncontrolled with hyperglycemia Obstructive sleep apnea/chronic hypercapnic respiratory failure/obesity hypoventilation syndrome Constipation, resolved Pressure injuries as outlined below Morbid obesity  RECOMMENDATIONS FOR OUTPATIENT FOLLOW UP: 1. Ambulatory referral has been sent to [facility] neurology, Dr. Allena KatzPatel, for EMG nerve conduction velocity studies of left upper extremity 2. Please start the patient on a statin medication at discharge     CODE STATUS: Full code  DISCHARGE CONDITION: fair  Diet recommendation: Modified carbohydrate  INITIAL HISTORY: Collin Brown a 34 y.o.malewith medical history significant for obstructive sleep apnea, morbid obesity, presented to the hospital on May 03, 2020 with cough, shortness of breath, generalized weakness, fever and a positive Covid 19 test. He was admitted to the medical floor for acute hypoxemic respiratory failure secondary to COVID-19 pneumonia. He was treated with IV remdesivir and IV steroids. Baricitinib was not given because of leukopenia. His condition deteriorated and hypoxemia worsened. He was transferred to the ICU on May 05, 2020 where he was initially treated with BiPAP. However, he failed BiPAP therapy and so he was eventually intubated and placed on mechanical ventilation on May 05, 2020. He self extubated on May 20, 2020 was transitioned to BiPAP. Hospital course was complicated by acute metabolic encephalopathy and ARDS. His condition slowly improved and was  transitioned to oxygenation via high flow nasal cannula.  He was then transferred to the floor for further management.    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    MRI of the brain showed chronic microhemorrhages, C-spine MRI showed minor degenerative change at C4-C5 but no abnormal cord signal.  CT scan of the chest with contrast to shoulder no obvious changes in the brachial plexus.   Neurology was consulted.  Patient started on 5-day course of high-dose IV steroids for possible brachial plexopathy.    He completed this on 10/28. Patient is able to move the fingers and is left hand and has regained some movement of his left shoulder but still unable to do anything with the arm.   Discussed with neurology.  No other interventions or treatment recommended.  Outpatient EMG/NCV.  Ambulatory referral sent to neurology, [place]. [place] in [place]. Seen by PT and OT.  CIR is recommended.  There is a bed available today.  Okay for discharge.  Acute hypoxic respiratory failure secondary to COVID-19 pneumonia with ARDS: Received treatment for pneumonia with IV steroids. Patient self extubated and has been on BiPAP at nighttime and nasal cannula during the daytime.    Respiratory status remained stable.  He is now only on 2 L of oxygen by nasal cannula.  Continue to wean down as tolerated.  Continue with incentive spirometry and mobilization.  He is currently off of isolation for COVID-19.  Right mild ulnar neuropathy Stable  Diabetes mellitus type 2 Hemoglobin A1c was 7.2.  This appears to be a new diagnosis for this patient.  No glucose lowering drugs noted on his home  medication list.  Likely due to steroids.   Dose of Levemir was decreased since he is now off of steroids.  Continue to  monitor CBGs and adjust insulin depending on those levels.  May need to go down further on the dosage. LDL 153.  Patient to be started on statin at discharge.     Obstructive sleep apnea/chronic hypercapnic respiratory failure/probable OHS Uses CPAP at home.  Pulmonology recommended BiPAP here in the hospital at nighttime.    Due to his chronic hypercapnic respiratory failure he will need BiPAP even at home.  Need outpatient follow-up with pulmonology when discharged from CIR.  Extreme debility, deconditioning Continue PT, OT.  Physical therapy recommended CIR.    Severe constipation Improved.  Continue senna, MiraLAX.    Pressure ulceration Present on admission. Continue Wound care.  Pressure Injury 05/12/20 Toe (Comment  which one) Anterior;Right Stage 2 -  Partial thickness loss of dermis presenting as a shallow open injury with a red, pink wound bed without slough. THESE ARE NOT PRESSURE INJURIES; COVID TOES; RELATED TO VASCULOPAT (Active)  05/12/20 1930  Location: Toe (Comment  which one)  Location Orientation: Anterior;Right  Staging: Stage 2 -  Partial thickness loss of dermis presenting as a shallow open injury with a red, pink wound bed without slough.  Wound Description (Comments): THESE ARE NOT PRESSURE INJURIES; COVID TOES; RELATED TO VASCULOPATHY [id number]  Present on Admission: No     Pressure Injury 05/12/20 Toe (Comment  which one) Anterior;Left Stage 2 -  Partial thickness loss of dermis presenting as a shallow open injury with a red, pink wound bed without slough. THESE ARE NOT PRESSURE INJURIES; COVID TOES; RELATED TO VASCULOPATH (Active)  05/12/20 1930  Location: Toe (Comment  which one)  Location Orientation: Anterior;Left  Staging: Stage 2 -  Partial thickness loss of dermis presenting as a shallow open injury with a red, pink wound bed without slough.  Wound Description (Comments): THESE ARE NOT PRESSURE INJURIES; COVID TOES; RELATED TO VASCULOPATHY   Present on  Admission: No     Pressure Injury 06/05/20 Jaw Right Unstageable - Full thickness tissue loss in which the base of the injury is covered by slough (yellow, tan, gray, green or brown) and/or eschar (tan, brown or black) in the wound bed. eschar (Active)  06/05/20 1437  Location: Jaw  Location Orientation: Right  Staging: Unstageable - Full thickness tissue loss in which the base of the injury is covered by slough (yellow, tan, gray, green or brown) and/or eschar (tan, brown or black) in the wound bed.  Wound Description (Comments): eschar  Present on Admission: No    Morbid obesity Estimated body mass index is 51.14 kg/m as calculated from the following:   Height as of this encounter: 5\' 10"  (1.778 m).   Weight as of this encounter: 161.7 kg.   Patient is stable.  Okay for discharge to inpatient rehabilitation.   PERTINENT LABS:  The results of significant diagnostics from this hospitalization (including imaging, microbiology, ancillary and laboratory) are listed below for reference.     Labs:  COVID-19 Labs   Lab Results  Component Value Date   SARSCOV2NAA POSITIVE (A) 05/03/2020      Basic Metabolic Panel: Recent Labs  Lab 06/04/20 0718 06/05/20 0450 06/06/20 0614 06/07/20 0341 06/08/20 0527  NA 135 134* 138 137 137  K 4.2 3.6 4.3 4.2 3.9  CL 96* 96* 98 99 99  CO2 28 29 30 29 30   GLUCOSE 256* 193* 125* 178*  146*  BUN 18 26* 24* 30* 32*  CREATININE 0.63 0.45* 0.35* 0.44* <0.30*  CALCIUM 9.6 9.2 9.6 9.1 9.2   CBC: Recent Labs  Lab 06/04/20 0718 06/05/20 0450 06/06/20 0614 06/07/20 0341 06/08/20 0527  WBC 6.8 12.6* 11.6* 10.7* 11.2*  NEUTROABS 6.2 10.5* 9.1* 8.4* 8.1*  HGB 12.8* 11.6* 12.0* 11.4* 11.3*  HCT 39.8 35.8* 38.1* 36.1* 35.8*  MCV 87.5 87.7 90.3 90.3 89.9  PLT 147* 160 143* 136* 136*   Cardiac Enzymes: Recent Labs  Lab 06/03/20 0905  CKTOTAL 29*   BNP: BNP (last 3 results) Recent Labs    05/03/20 1633 05/16/20 1435  BNP 31.1  77.7    CBG: Recent Labs  Lab 06/07/20 0813 06/07/20 1151 06/07/20 1540 06/07/20 2137 06/08/20 0813  GLUCAP 115* 268* 224* 268* 96     IMAGING STUDIES CT HEAD WO CONTRAST  Result Date: 05/22/2020 CLINICAL DATA:  34 year old male positive COVID-19 pneumonia. Neurologic deficit. EXAM: CT HEAD WITHOUT CONTRAST TECHNIQUE: Contiguous axial images were obtained from the base of the skull through the vertex without intravenous contrast. COMPARISON:  Head CT 05/16/2020. FINDINGS: Brain: No midline shift, ventriculomegaly, mass effect, evidence of mass lesion, intracranial hemorrhage or evidence of cortically based acute infarction. Gray-white matter differentiation is within normal limits throughout the brain. No convincing encephalomalacia. Vascular: No suspicious intracranial vascular hyperdensity. Skull: No acute osseous abnormality identified. Sinuses/Orbits: Improved paranasal sinus aeration, residual subtotal fluid opacification of the sphenoid sinuses. Bilateral mastoid fusions but tympanic cavity aeration appears improved. Other: Negative orbit and scalp soft tissues. IMPRESSION: 1. Stable, normal noncontrast CT appearance of the brain. 2. Improving paranasal sinus and middle ear pneumatization. Electronically Signed   By: Odessa Fleming M.D.   On: 05/22/2020 15:43   CT HEAD WO CONTRAST  Result Date: 05/16/2020 CLINICAL DATA:  Delirium. Coronavirus infection with ventilator support. EXAM: CT HEAD WITHOUT CONTRAST TECHNIQUE: Contiguous axial images were obtained from the base of the skull through the vertex without intravenous contrast. COMPARISON:  None. FINDINGS: Brain: The brain shows a normal appearance without evidence of malformation, atrophy, old or acute small or large vessel infarction, mass lesion, hemorrhage, hydrocephalus or extra-axial collection. Vascular: No hyperdense vessel. No evidence of atherosclerotic calcification. Skull: Normal.  No traumatic finding.  No focal bone lesion.  Sinuses/Orbits: Opacification of the paranasal sinuses, often seen with chronic ventilator support. Other: None significant IMPRESSION: 1. Normal head CT. 2. Opacification of the paranasal sinuses, often seen with chronic ventilator support. Electronically Signed   By: Paulina Fusi M.D.   On: 05/16/2020 14:23   CT CHEST W CONTRAST  Result Date: 06/02/2020 CLINICAL DATA:  Shoulder pain, evaluate for brachial plexus mass. EXAM: CT CHEST WITH CONTRAST TECHNIQUE: Multidetector CT imaging of the chest was performed during intravenous contrast administration. CONTRAST:  OMNIPAQUE IOHEXOL 300 MG/ML  SOLN COMPARISON:  05/16/2020 FINDINGS: Cardiovascular: Normal heart size. No pericardial effusion. No acute vascular finding. Mediastinum/Nodes: Negative for adenopathy or mass. Resolved pneumomediastinum. Lungs/Pleura: Evolution of COVID associated pneumonia which has a more streaky and reticular appearance than the ground-glass density seen previously (fibrotic features). No edema, effusion, or pneumothorax. Upper Abdomen: Unremarkable Musculoskeletal: Concerning the patient's left brachial plexus symptoms, no mass or fluid collection is seen at the level of the left brachial plexus. A cervical MRI was recently performed which covered the roots, which were also unremarkable. IMPRESSION: 1. No abnormality seen at the level of the left brachial plexus. 2. Evolving COVID pneumonia. Electronically Signed   By: Marja Kays  Watts M.D.   On: 06/02/2020 12:09   CT ANGIO CHEST PE W OR WO CONTRAST  Result Date: 05/16/2020 CLINICAL DATA:  Coronavirus infection. Ventilator support. Assess for pulmonary emboli. EXAM: CT ANGIOGRAPHY CHEST WITH CONTRAST TECHNIQUE: Multidetector CT imaging of the chest was performed using the standard protocol during bolus administration of intravenous contrast. Multiplanar CT image reconstructions and MIPs were obtained to evaluate the vascular anatomy. CONTRAST:  42mL OMNIPAQUE IOHEXOL 350  MG/ML SOLN COMPARISON:  Radiography 05/14/2020 FINDINGS: Cardiovascular: Pulmonary arterial opacification is poor. I do not see any pulmonary emboli, but anything other than large central emboli would be inapparent. Heart size is normal. No pericardial effusion. No acute aortic pathology. Mediastinum/Nodes: Pneumomediastinum of a mild degree. Lungs/Pleura: Widespread bilateral patchy pulmonary infiltrates without dense lobar consolidation or collapse. No effusions. Upper Abdomen: Negative Musculoskeletal: Negative Review of the MIP images confirms the above findings. IMPRESSION: 1. Pneumomediastinum of a mild degree. 2. Pulmonary arterial opacification is poor. I do not see any pulmonary emboli, but anything other than large central emboli would be inapparent. 3. Widespread bilateral patchy pulmonary infiltrates consistent with viral pneumonia. No dense lobar consolidation or collapse. Electronically Signed   By: Paulina Fusi M.D.   On: 05/16/2020 14:26   MR BRAIN WO CONTRAST  Result Date: 05/31/2020 CLINICAL DATA:  Left arm weakness EXAM: MRI HEAD WITHOUT CONTRAST TECHNIQUE: Multiplanar, multiecho pulse sequences of the brain and surrounding structures were obtained without intravenous contrast. COMPARISON:  None. FINDINGS: Brain: There is no acute infarction or intracranial hemorrhage. There is no intracranial mass, mass effect, or edema. There is no hydrocephalus or extra-axial fluid collection. Ventricles and sulci are normal in size and configuration. Scattered foci of susceptibility in the cerebral white matter most compatible with chronic microhemorrhages. Vascular: Major vessel flow voids at the skull base are preserved. Skull and upper cervical spine: Normal marrow signal is preserved. Sinuses/Orbits: Minor mucosal thickening.  Orbits are unremarkable. Other: Sella is unremarkable. Bilateral patchy mastoid fluid opacification. IMPRESSION: No evidence of recent infarction or hemorrhage. Moderate burden  of chronic microhemorrhages, unexpected for age. Electronically Signed   By: Guadlupe Spanish M.D.   On: 05/31/2020 17:19   MR CERVICAL SPINE WO CONTRAST  Result Date: 05/31/2020 CLINICAL DATA:  Left arm weakness EXAM: MRI CERVICAL SPINE WITHOUT CONTRAST TECHNIQUE: Multiplanar, multisequence MR imaging of the cervical spine was performed. No intravenous contrast was administered. COMPARISON:  None. FINDINGS: Alignment: Preserved. Vertebrae: Vertebral body heights are maintained. Cord: No abnormal signal. Posterior Fossa, vertebral arteries, paraspinal tissues: Unremarkable. Disc levels: There is a minimal disc bulge with superimposed very small left central disc protrusion at C4-C5 indenting the ventral thecal sac. No canal or foraminal stenosis at any level. IMPRESSION: No abnormal cord signal.  Minor degenerative disc disease at C4-C5. Electronically Signed   By: Guadlupe Spanish M.D.   On: 05/31/2020 15:55   US Venous Img Lower Bilateral (DVT)  Result Date: 05/17/2020 CLINICAL DATA:  Respiratory failure EXAM: BILATERAL LOWER EXTREMITY VENOUS DOPPLER ULTRASOUND TECHNIQUE: Gray-scale sonography with compression, as well as color and duplex ultrasound, were performed to evaluate the deep venous system(s) from the level of the common femoral vein through the popliteal and proximal calf veins. COMPARISON:  None. FINDINGS: VENOUS Normal compressibility of the common femoral, superficial femoral, and popliteal veins, as well as the visualized calf veins. Visualized portions of profunda femoral vein and great saphenous vein unremarkable. No filling defects to suggest DVT on grayscale or color Doppler imaging. Doppler waveforms show normal direction  of venous flow, normal respiratory plasticity and response to augmentation. OTHER None. Limitations: none IMPRESSION: Negative. Electronically Signed   By: Katherine Mantle M.D.   On: 05/17/2020 18:35   US Venous Img Upper Bilat (DVT)  Result Date:  05/17/2020 CLINICAL DATA:  Respiratory failure EXAM: BILATERAL UPPER EXTREMITY VENOUS DOPPLER ULTRASOUND TECHNIQUE: Gray-scale sonography with graded compression, as well as color Doppler and duplex ultrasound were performed to evaluate the bilateral upper extremity deep venous systems from the level of the subclavian vein and including the jugular, axillary, basilic, radial, ulnar and upper cephalic vein. Spectral Doppler was utilized to evaluate flow at rest and with distal augmentation maneuvers. COMPARISON:  None. FINDINGS: RIGHT UPPER EXTREMITY Internal Jugular Vein: No evidence of thrombus. Normal compressibility, respiratory phasicity and response to augmentation. Subclavian Vein: No evidence of thrombus. Normal compressibility, respiratory phasicity and response to augmentation. Axillary Vein: No evidence of thrombus. Normal compressibility, respiratory phasicity and response to augmentation. Cephalic Vein: No evidence of thrombus. Normal compressibility, respiratory phasicity and response to augmentation. Basilic Vein: No evidence of thrombus. Normal compressibility, respiratory phasicity and response to augmentation. Brachial Veins: No evidence of thrombus. Normal compressibility, respiratory phasicity and response to augmentation. Radial Veins: No evidence of thrombus. Normal compressibility, respiratory phasicity and response to augmentation. Ulnar Veins: No evidence of thrombus. Normal compressibility, respiratory phasicity and response to augmentation. Other Findings:  None. LEFT UPPER EXTREMITY Internal Jugular Vein: Evaluation of the left internal jugular vein is limited by the presence of a central venous catheter. No DVT was identified at this level given this limitation. Subclavian Vein: No evidence of thrombus. Normal compressibility, respiratory phasicity and response to augmentation. Axillary Vein: No evidence of thrombus. Normal compressibility, respiratory phasicity and response to  augmentation. Cephalic Vein: No evidence of thrombus. Normal compressibility, respiratory phasicity and response to augmentation. Basilic Vein: No evidence of thrombus. Normal compressibility, respiratory phasicity and response to augmentation. Brachial Veins: No evidence of thrombus. Normal compressibility, respiratory phasicity and response to augmentation. Radial Veins: No evidence of thrombus. Normal compressibility, respiratory phasicity and response to augmentation. Ulnar Veins: No evidence of thrombus. Normal compressibility, respiratory phasicity and response to augmentation. Other Findings:  None. IMPRESSION: No evidence of DVT within either upper extremity. Electronically Signed   By: Katherine Mantle M.D.   On: 05/17/2020 18:36   DG Chest Port 1 View  Result Date: 05/25/2020 CLINICAL DATA:  COVID-19. EXAM: PORTABLE CHEST 1 VIEW COMPARISON:  May 18, 2020. FINDINGS: Stable cardiomediastinal silhouette. Endotracheal and nasogastric tubes have been removed. Hypoinflation of the lungs is noted. Increased bilateral patchy airspace opacities are noted concerning for multifocal pneumonia. No pneumothorax or pleural effusion is noted. Bony thorax is unremarkable. IMPRESSION: Increased bilateral patchy airspace opacities are noted concerning for worsening multifocal pneumonia. Endotracheal and nasogastric tubes have been removed. Electronically Signed   By: Lupita Raider M.D.   On: 05/25/2020 10:40   DG Chest Port 1 View  Result Date: 05/18/2020 CLINICAL DATA:  Acute respiratory failure. EXAM: PORTABLE CHEST 1 VIEW COMPARISON:  05/14/2020. FINDINGS: Endotracheal tube and NG tube in stable position. Heart size stable. Persistent but significant improved bilateral pulmonary infiltrates/edema. No pleural effusion or pneumothorax. IMPRESSION: 1. Endotracheal tube and NG tube in stable position. 2. Persistent but significant improved bilateral pulmonary infiltrates/edema. Electronically Signed   By:  Maisie Fus  Register   On: 05/18/2020 05:07   DG Chest Port 1 View  Result Date: 05/14/2020 CLINICAL DATA:  Acute respiratory failure.  COVID positive. EXAM: PORTABLE CHEST 1  VIEW COMPARISON:  05/11/2020 FINDINGS: 0443 hours. Endotracheal tube tip is 5.1 cm above the base of the carina. The NG tube passes into the stomach although the distal tip position is not included on the film. Cardiopericardial silhouette is at upper limits of normal for size. Diffuse bilateral airspace disease is similar to prior. Left lung apex not included on the film. Subcutaneous emphysema seen in the supraclavicular regions and left chest wall previously has decreased. IMPRESSION: No substantial interval change in the diffuse bilateral airspace disease. Electronically Signed   By: Kennith Center M.D.   On: 05/14/2020 07:36   DG Chest Port 1 View  Result Date: 05/11/2020 CLINICAL DATA:  Acute respiratory failure with hypoxia EXAM: PORTABLE CHEST 1 VIEW COMPARISON:  05/10/2020 FINDINGS: Enteric and endotracheal tubes appear unchanged in position. Left central venous catheter with tip projected over the brachiocephalic vein. Prominent subcutaneous emphysema extending into the base of the neck. No definite pneumothorax or pneumomediastinum. Diffuse bilateral airspace disease throughout the lungs. Heart size appears normal but is obscured by the parenchymal process. No pleural effusions. IMPRESSION: Diffuse bilateral airspace disease throughout the lungs. Prominent subcutaneous emphysema extending into the base of the neck. No change since previous study. Electronically Signed   By: Burman Nieves M.D.   On: 05/11/2020 05:42   DG Chest Port 1 View  Result Date: 05/10/2020 CLINICAL DATA:  Hypoxia, respiratory failure EXAM: PORTABLE CHEST 1 VIEW COMPARISON:  05/09/2020 FINDINGS: Two prone supine radiographs of the chest were obtained. Endotracheal tube is present terminating approximately 4.0 cm above the carina. Enteric tube courses  below the diaphragm with distal tip extending beyond the inferior margin of the film. Stable positioning of left IJ central venous catheter. Extensive bilateral airspace consolidations similar to the prior study. There is extensive chest wall subcutaneous emphysema. Probable skin fold overlying the left hemithorax. No discernible pneumothorax. IMPRESSION: 1. Extensive chest wall subcutaneous emphysema and overlying skin folds related to prone positioning limit the detection for pneumothorax, which was question on the previous study. A follow-up study after patient repositioning is suggested. 2. Extensive bilateral airspace consolidations, similar to the prior study. 3. Support lines and tubes in satisfactory position. Electronically Signed   By: Duanne Guess D.O.   On: 05/10/2020 08:38   DG Chest Port 1 View  Result Date: 05/09/2020 CLINICAL DATA:  Respiratory failure EXAM: PORTABLE CHEST 1 VIEW COMPARISON:  05/07/2020, 05/05/2020, 05/03/2020 FINDINGS: Endotracheal tube tip is about 3.6 cm superior to the carina. Left IJ central venous catheter similar in position. Esophageal tube tip is below the diaphragm but incompletely visualized. Extensive bilateral pulmonary airspace disease with worsening compared to prior radiograph. Interim development of extensive subcutaneous emphysema at the chest and upper abdomen. Possible pneumomediastinum. Possible trace right apical pneumothorax. IMPRESSION: 1. Endotracheal tube tip about 3.6 cm superior to the carina. 2. Interim development of extensive subcutaneous emphysema throughout the chest and upper abdomen. Probable pneumomediastinum. Possible trace right apical pneumothorax. Attention on follow-up radiography. 3. Worsening of extensive bilateral pulmonary airspace disease. Electronically Signed   By: Jasmine Pang M.D.   On: 05/09/2020 22:06   DG Shoulder Left  Result Date: 05/26/2020 CLINICAL DATA:  34 year old male with left shoulder pain EXAM: LEFT  SHOULDER - 2+ VIEW COMPARISON:  Chest radiograph from 05/18/2020 FINDINGS: There is no evidence of fracture or dislocation. There is no evidence of arthropathy or other focal bone abnormality. Soft tissues are unremarkable. IMPRESSION: No acute fracture or malalignment. Electronically Signed   By: Marliss Coots  MD   On: 05/26/2020 15:57   ECHOCARDIOGRAM COMPLETE  Result Date: 05/11/2020    ECHOCARDIOGRAM REPORT   Patient Name:   Collin Brown Date of Exam: 05/11/2020 Medical Rec #:  [id number]       Height:       70.0 in Accession #:    [id number]      Weight:       403.2 lb Date of Birth:  Sep 30, 1985       BSA:          2.812 m Patient Age:    34 years        BP:           129/61 mmHg Patient Gender: M               HR:           120 bpm. Exam Location:  [facility] Procedure: 2D Echo, Cardiac Doppler and Color Doppler Indications:     Abnormal ECG 794.31 / R94.31  History:         Patient has no prior history of Echocardiogram examinations.  Sonographer:     Neysa Bonito Roar Referring Phys:  [id number] Eugenie Norrie Diagnosing Phys: Julien Nordmann MD IMPRESSIONS  1. Left ventricular ejection fraction, by estimation, is 60 to 65%. The left ventricle has normal function. The left ventricle has no regional wall motion abnormalities. Left ventricular diastolic parameters are consistent with Grade I diastolic dysfunction (impaired relaxation).  2. Right ventricular systolic function is normal. The right ventricular size is normal.  3. Challenging images FINDINGS  Left Ventricle: Left ventricular ejection fraction, by estimation, is 60 to 65%. The left ventricle has normal function. The left ventricle has no regional wall motion abnormalities. The left ventricular internal cavity size was normal in size. There is  no left ventricular hypertrophy. Left ventricular diastolic parameters are consistent with Grade I diastolic dysfunction (impaired relaxation). Right Ventricle: The right ventricular size is normal. No increase in  right ventricular wall thickness. Right ventricular systolic function is normal. Left Atrium: Left atrial size was normal in size. Right Atrium: Right atrial size was normal in size. Pericardium: There is no evidence of pericardial effusion. Mitral Valve: The mitral valve is normal in structure. No evidence of mitral valve regurgitation. No evidence of mitral valve stenosis. Tricuspid Valve: The tricuspid valve is normal in structure. Tricuspid valve regurgitation is not demonstrated. No evidence of tricuspid stenosis. Aortic Valve: The aortic valve was not well visualized. Aortic valve regurgitation is not visualized. No aortic stenosis is present. Aortic valve peak gradient measures 15.5 mmHg. Pulmonic Valve: The pulmonic valve was normal in structure. Pulmonic valve regurgitation is not visualized. No evidence of pulmonic stenosis. Aorta: The aortic root is normal in size and structure. Venous: The inferior vena cava is normal in size with greater than 50% respiratory variability, suggesting right atrial pressure of 3 mmHg. IAS/Shunts: No atrial level shunt detected by color flow Doppler.  LEFT VENTRICLE PLAX 2D LVIDd:         4.38 cm  Diastology LVIDs:         3.29 cm  LV e' medial:    6.53 cm/s LV PW:         1.26 cm  LV E/e' medial:  16.1 LV IVS:        1.19 cm  LV e' lateral:   17.20 cm/s LVOT diam:     2.00 cm  LV E/e' lateral: 6.1 LVOT Area:  3.14 cm  RIGHT VENTRICLE RV Mid diam:    2.96 cm RV S prime:     30.40 cm/s TAPSE (M-mode): 2.0 cm LEFT ATRIUM             Index      RIGHT ATRIUM           Index LA diam:        3.70 cm 1.32 cm/m RA Area:     10.40 cm LA Vol (A2C):   25.7 ml 9.14 ml/m RA Volume:   20.50 ml  7.29 ml/m LA Vol (A4C):   23.7 ml 8.43 ml/m LA Biplane Vol: 24.8 ml 8.82 ml/m  AORTIC VALVE                PULMONIC VALVE AV Area (Vmax): 2.31 cm    PV Vmax:        1.66 m/s AV Vmax:        197.00 cm/s PV Peak grad:   11.0 mmHg AV Peak Grad:   15.5 mmHg   RVOT Peak grad: 7 mmHg LVOT  Vmax:      145.00 cm/s  AORTA Ao Root diam: 2.70 cm MITRAL VALVE MV Area (PHT): 5.42 cm     SHUNTS MV Decel Time: 140 msec     Systemic Diam: 2.00 cm MV E velocity: 105.00 cm/s MV A velocity: 134.00 cm/s MV E/A ratio:  0.78 MV A Prime:    13.2 cm/s Julien Nordmann MD Electronically signed by Julien Nordmann MD Signature Date/Time: 05/11/2020/6:05:34 PM    Final    [place] EKG SITE RITE  Result Date: 05/17/2020 If Site Rite image not attached, placement could not be confirmed due to current cardiac rhythm.   DISCHARGE EXAMINATION: Vitals:   06/07/20 2004 06/08/20 0633 06/08/20 0640 06/08/20 0814  BP: (!) 166/99 (!) 140/95  (!) 163/97  Pulse: 99 89  91  Resp:  20  18  Temp: 97.9 F (36.6 C) 97.8 F (36.6 C)  98.3 F (36.8 C)  TempSrc: Oral Oral    SpO2: 94% 93%  96%  Weight:   (!) 161.7 kg   Height:       General appearance: Awake alert.  In no distress Resp: Clear to auscultation bilaterally.  Normal effort Cardio: S1-S2 is normal regular.  No S3-S4.  No rubs murmurs or bruit GI: Abdomen is soft.  Nontender nondistended.  Bowel sounds are present normal.  No masses organomegaly Continues to have weakness of the left upper extremity   DISPOSITION: CIR  Discharge Instructions    Ambulatory referral to Neurology   Complete by: As directed    An appointment is requested in approximately: 2 weeks for left arm weakness. EMG/NCV recommended by inpatient neurology. Patient going to CIR from [facility]. Thanks   Ambulatory referral to Physical Medicine Rehab   Complete by: As directed    COVID-19 debility       Current Inpatient Medications:  Scheduled: . vitamin C  500 mg Oral Daily  . aspirin  81 mg Oral Daily  . chlorhexidine  15 mL Mouth Rinse BID  . Chlorhexidine Gluconate Cloth  6 each Topical Q0600  . collagenase   Topical Daily  . enoxaparin (LOVENOX) injection  0.5 mg/kg Subcutaneous Q24H  . feeding supplement (NEPRO CARB STEADY)  237 mL Oral TID BM  . insulin aspart  0-20 Units  Subcutaneous TID WC  . insulin aspart  0-5 Units Subcutaneous QHS  . insulin aspart  4 Units Subcutaneous TID  WC  . insulin detemir  15 Units Subcutaneous BID  . mouth rinse  15 mL Mouth Rinse q12n4p  . metoprolol tartrate  50 mg Oral BID  . multivitamin with minerals  1 tablet Oral Daily  . neomycin-bacitracin-polymyxin   Topical Daily  . polyethylene glycol  17 g Oral BID  . senna  1 tablet Oral BID   Continuous: . sodium chloride Stopped (06/06/20 1215)   DUK:GURKYH chloride, acetaminophen, dextrose, ibuprofen, morphine injection, sodium chloride flush    TOTAL DISCHARGE TIME: 35 minutes  Jaylenn Altier [employer] on [email or web address]  06/08/2020, 10:20 AM

## 2020-06-08 NOTE — Progress Notes (Signed)
Inpatient Rehab Admissions Coordinator:   I have insurance authorization and a bed available for this pt to admit to CIR today. Dr. Rito Ehrlich in agreement. I will let pt/family and TOC team know.  I will arrange transport with [facility] once CIR bed ready.   Estill Dooms, PT, DPT Admissions Coordinator [phone] 06/08/20  10:13 AM

## 2020-06-08 NOTE — Progress Notes (Signed)
Patient admitted to floor from [facility] via stretcher by EMS with mom by side. Patient oriented to floor, fall assessment reviewed with patient and patient made aware of plans.m Wound care as ordered. Call bell within reach and bed in lowest postion

## 2020-06-08 NOTE — H&P (Signed)
Physical Medicine and Rehabilitation Admission H&P    Chief Complaint  Patient presents with  . Cough  . Shortness of Breath  : HPI: Collin Brown is a 34 year old right-handed male with obstructive sleep apnea, morbid obesity BMI 51.88.  History taken from chart review, mother, and patient.  Patient lives with his mother and 14 year old nephew.  Reportedly independent prior to admission working as a Production designer, theatre/television/film at [employer].  He presented on 05/03/2020 with increasing shortness of breath, cough, and low-grade fever.  Reportedly patient had recently traveled to [place] to visit his brother over the summer. .  Patient found to be COVID-19 positive.  He was treated with IV remdesivir and IV steroids.  His condition  did initially deteriorate and required intubation and ultimately self extubated on 05/20/2020 and transition to BiPAP.  Hospital course further complicated by acute metabolic encephalopathy.  CT/MRI unremarkable for acute intracranial process.  Patient did have some complaints of left shoulder pain with weakness and evaluated for possible brachial plexus mass. CT of the chest completed showing no abnormality at the level of left brachial plexus.  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  He was placed on Lovenox for DVT prophylaxis.  Findings of elevated hemoglobin A1c 7.2 and placed on insulin therapy. WOC follow-up for pressure injury gluteal cleft with wound care as directed.  He is tolerating a mechanical soft thin liquid diet.  Therapy evaluations completed and patient was admitted for a comprehensive rehab program.  Please see preadmission assessment from earlier today as well.  Review of Systems  Constitutional: Positive for malaise/fatigue. Negative for chills.  HENT: Negative for hearing loss.   Eyes: Negative for blurred vision and double vision.  Respiratory: Positive for cough and  shortness of breath. Negative for wheezing.   Cardiovascular: Positive for leg swelling. Negative for chest pain and palpitations.  Gastrointestinal: Positive for constipation. Negative for heartburn, nausea and vomiting.  Genitourinary: Negative for dysuria, flank pain and hematuria.  Musculoskeletal: Positive for joint pain and myalgias.  Skin: Negative for rash.  Neurological: Positive for sensory change, focal weakness and weakness.  All other systems reviewed and are negative.  Past Medical History:  Diagnosis Date  . Diabetes mellitus without complication (HCC)   . Hearing loss in right ear   . Hypertension   . Sleep apnea    Past Surgical History:  Procedure Laterality Date  . TONSILLECTOMY     Family History  Problem Relation Age of Onset  . Diabetes Mellitus II Mother   . Hypertension Mother   . Diabetes Mellitus II Father   . High blood pressure Father    Social History:  reports that he has never smoked. He has never used smokeless tobacco. He reports that he does not drink alcohol and does not use drugs. Allergies: No Known Allergies Medications Prior to Admission  Medication Sig Dispense Refill  . amoxicillin-clavulanate (AUGMENTIN) 875-125 MG tablet Take 1 tablet by mouth 2 (two) times daily. (Patient not taking: Reported on 05/03/2020) 20 tablet 0    Drug Regimen Review Drug regimen was reviewed and remains appropriate with no significant issues identified  Home: Home Living Family/patient expects to be discharged to:: Private residence Living Arrangements: Parent, Other (Comment) (Mother and 19yo nephew) Available Help at Discharge: Family Type of Home: House Home Layout: One level Home Equipment: None Additional Comments: Pt unable to verbalize, therapist unable to read lips, unable  to hold pen to write; mom's phone goes straight to voicemail; will verify as able   Functional History: Prior Function Level of Independence: Independent Comments: Pt  wokred fulltime   Functional Status:  Mobility: Bed Mobility Overal bed mobility: Needs Assistance Bed Mobility: Supine to Sit, Sit to Supine Rolling: Mod assist Supine to sit: Mod assist Sit to supine: +2 for safety/equipment, Mod assist General bed mobility comments: During supine to sit, pt is mod+1 as he needs help with trunk. Pt sat up on the L side of bed today, which is more difficult for patient. For sit to supine, pt was modA+2 as he needed assistance with trunk and LEs. Pt performed rolling to both sides with modA to straighten out bed linen Transfers Overall transfer level: Needs assistance Equipment used: 2 person hand held assist Transfer via Lift Equipment: [place] Dec Lift (Attempted STS lift to recliner, but Large size sling was not big enough. Unable to find XL sling) Transfers: Sit to/from Stand Sit to Stand: +2 physical assistance, From elevated surface, Max assist General transfer comment: Pt attempted sit<>stand with maxA+2. However, unable to fully stand up as patient is fatigued Ambulation/Gait General Gait Details: Did not attempt due to pt fatigued    ADL: ADL Overall ADL's : Needs assistance/impaired Eating/Feeding: Bed level, With adaptive utensils, Set up, Supervision/ safety Eating/Feeding Details (indicate cue type and reason): supervision for safety to minimize risk of aspiration; HOB 90degr, pillows positioned to support RUE to support improved tolerance for self feeding. Red foam built up handle applied to spoon with noted improvement in quality of movement to scoop and bring to mouth and pt endorses improvement; pt able to eat approx 25% of meal after set up requiring assist for another 25% 2/2 fatigue Grooming: Wash/dry face, Bed level, Maximal assistance Grooming Details (indicate cue type and reason): max A to perform requiring rest breaks 2/2 significant fatigue General ADL Comments: SETUP + CGA tooth brushing and face washing seated EOB, MIN A washing hair  - assist for back of head / thoroughness. MAX A for LBD at bed level   Cognition: Cognition Overall Cognitive Status: Within Functional Limits for tasks assessed Orientation Level: Oriented X4 Cognition Arousal/Alertness: Awake/alert Behavior During Therapy: WFL for tasks assessed/performed, Flat affect Overall Cognitive Status: Within Functional Limits for tasks assessed Area of Impairment: Following commands Current Attention Level: Alternating, Divided Following Commands: Follows one step commands with increased time Problem Solving: Slow processing General Comments: Pt more talkative this session and carries on a conversation Difficult to assess due to: Impaired communication  Physical Exam: Blood pressure (!) 162/100, pulse (!) 102, temperature 97.8 F (36.6 C), temperature source Oral, resp. rate 17, height 5\' 10"  (1.778 m), weight (!) 161.7 kg, SpO2 96 %. Physical Exam Vitals reviewed.  Constitutional:      General: He is not in acute distress.    Appearance: He is obese.  HENT:     Head: Normocephalic and atraumatic.     Right Ear: External ear normal.     Left Ear: External ear normal.     Nose: Nose normal.  Eyes:     General:        Right eye: No discharge.        Left eye: No discharge.     Extraocular Movements: Extraocular movements intact.  Cardiovascular:     Rate and Rhythm: Normal rate and regular rhythm.  Pulmonary:     Effort: Pulmonary effort is normal. No respiratory distress.  Breath sounds: Normal breath sounds.     Comments: + [place]oad [place]. Abdominal:     General: Abdomen is flat. Bowel sounds are normal. There is no distension.  Musculoskeletal:     Cervical back: Normal range of motion and neck supple.     Comments: Edema left upper extremity, bilateral lower extremities  Skin:    General: Skin is warm and dry.  Neurological:     Mental Status: He is alert.     Comments: Alert and oriented Follows commands Hyperalgesia LUE Motor:RUE: 4/5 B/l  LE: HF, KE 4-/5, ADF 4/5 RUE: Shoulder abduction 2-/5, elbow flexion/extention 1+/5, wrist extension 2+/5, hand grip 3-/5  Psychiatric:        Mood and Affect: Mood normal.        Behavior: Behavior normal.     Results for orders placed or performed during the hospital encounter of 05/03/20 (from the past 48 hour(s))  Glucose, capillary     Status: Abnormal   Collection Time: 06/06/20  5:33 PM  Result Value Ref Range   Glucose-Capillary 258 (H) 70 - 99 mg/dL    Comment: Glucose reference range applies only to samples taken after fasting for at least 8 hours.  Glucose, capillary     Status: Abnormal   Collection Time: 06/06/20  9:22 PM  Result Value Ref Range   Glucose-Capillary 272 (H) 70 - 99 mg/dL    Comment: Glucose reference range applies only to samples taken after fasting for at least 8 hours.  CBC with Differential/Platelet     Status: Abnormal   Collection Time: 06/07/20  3:41 AM  Result Value Ref Range   WBC 10.7 (H) 4.0 - 10.5 K/uL   RBC 4.00 (L) 4.22 - 5.81 MIL/uL   Hemoglobin 11.4 (L) 13.0 - 17.0 g/dL   HCT 55.7 (L) 39 - 52 %   MCV 90.3 80.0 - 100.0 fL   MCH 28.5 26.0 - 34.0 pg   MCHC 31.6 30.0 - 36.0 g/dL   RDW 32.2 (H) 02.5 - 42.7 %   Platelets 136 (L) 150 - 400 K/uL   nRBC 0.0 0.0 - 0.2 %   Neutrophils Relative % 78 %   Neutro Abs 8.4 (H) 1.7 - 7.7 K/uL   Lymphocytes Relative 12 %   Lymphs Abs 1.3 0.7 - 4.0 K/uL   Monocytes Relative 8 %   Monocytes Absolute 0.8 0.1 - 1.0 K/uL   Eosinophils Relative 0 %   Eosinophils Absolute 0.0 0.0 - 0.5 K/uL   Basophils Relative 0 %   Basophils Absolute 0.0 0.0 - 0.1 K/uL   Immature Granulocytes 2 %   Abs Immature Granulocytes 0.16 (H) 0.00 - 0.07 K/uL    Comment: Performed at [facility], [address] [place], [place] [address]  Basic metabolic panel     Status: Abnormal   Collection Time: 06/07/20  3:41 AM  Result Value Ref Range   Sodium 137 135 - 145 mmol/L   Potassium 4.2 3.5 - 5.1 mmol/L    Chloride 99 98 - 111 mmol/L   CO2 29 22 - 32 mmol/L   Glucose, Bld 178 (H) 70 - 99 mg/dL    Comment: Glucose reference range applies only to samples taken after fasting for at least 8 hours.   BUN 30 (H) 6 - 20 mg/dL   Creatinine, Ser 6.28 (L) 0.61 - 1.24 mg/dL   Calcium 9.1 8.9 - 31.5 mg/dL   GFR, Estimated >17 >61 mL/min    Comment: (  NOTE) Calculated using the CKD-EPI Creatinine Equation (2021)    Anion gap 9 5 - 15    Comment: Performed at [facility], [address] [place], [place] [address]  Glucose, capillary     Status: Abnormal   Collection Time: 06/07/20  8:13 AM  Result Value Ref Range   Glucose-Capillary 115 (H) 70 - 99 mg/dL    Comment: Glucose reference range applies only to samples taken after fasting for at least 8 hours.  Glucose, capillary     Status: Abnormal   Collection Time: 06/07/20 11:51 AM  Result Value Ref Range   Glucose-Capillary 268 (H) 70 - 99 mg/dL    Comment: Glucose reference range applies only to samples taken after fasting for at least 8 hours.  Glucose, capillary     Status: Abnormal   Collection Time: 06/07/20  3:40 PM  Result Value Ref Range   Glucose-Capillary 224 (H) 70 - 99 mg/dL    Comment: Glucose reference range applies only to samples taken after fasting for at least 8 hours.  Glucose, capillary     Status: Abnormal   Collection Time: 06/07/20  9:37 PM  Result Value Ref Range   Glucose-Capillary 268 (H) 70 - 99 mg/dL    Comment: Glucose reference range applies only to samples taken after fasting for at least 8 hours.  CBC with Differential/Platelet     Status: Abnormal   Collection Time: 06/08/20  5:27 AM  Result Value Ref Range   WBC 11.2 (H) 4.0 - 10.5 K/uL   RBC 3.98 (L) 4.22 - 5.81 MIL/uL   Hemoglobin 11.3 (L) 13.0 - 17.0 g/dL   HCT 08.6 (L) 39 - 52 %   MCV 89.9 80.0 - 100.0 fL   MCH 28.4 26.0 - 34.0 pg   MCHC 31.6 30.0 - 36.0 g/dL   RDW 57.8 (H) 46.9 - 62.9 %   Platelets 136 (L) 150 - 400 K/uL   nRBC 0.4 (H)  0.0 - 0.2 %   Neutrophils Relative % 72 %   Neutro Abs 8.1 (H) 1.7 - 7.7 K/uL   Lymphocytes Relative 17 %   Lymphs Abs 1.9 0.7 - 4.0 K/uL   Monocytes Relative 9 %   Monocytes Absolute 1.0 0.1 - 1.0 K/uL   Eosinophils Relative 0 %   Eosinophils Absolute 0.0 0.0 - 0.5 K/uL   Basophils Relative 0 %   Basophils Absolute 0.0 0.0 - 0.1 K/uL   Immature Granulocytes 2 %   Abs Immature Granulocytes 0.26 (H) 0.00 - 0.07 K/uL    Comment: Performed at [facility], [address] [place], [place] [address]  Basic metabolic panel     Status: Abnormal   Collection Time: 06/08/20  5:27 AM  Result Value Ref Range   Sodium 137 135 - 145 mmol/L   Potassium 3.9 3.5 - 5.1 mmol/L   Chloride 99 98 - 111 mmol/L   CO2 30 22 - 32 mmol/L   Glucose, Bld 146 (H) 70 - 99 mg/dL    Comment: Glucose reference range applies only to samples taken after fasting for at least 8 hours.   BUN 32 (H) 6 - 20 mg/dL   Creatinine, Ser <3.24 (L) 0.61 - 1.24 mg/dL   Calcium 9.2 8.9 - 40.1 mg/dL   GFR, Estimated NOT CALCULATED >60 mL/min    Comment: (NOTE) Calculated using the CKD-EPI Creatinine Equation (2021)    Anion gap 8 5 - 15    Comment: Performed at [facility], [address] [place], [place] [address]  Glucose, capillary     Status: None   Collection Time: 06/08/20  8:13 AM  Result Value Ref Range   Glucose-Capillary 96 70 - 99 mg/dL    Comment: Glucose reference range applies only to samples taken after fasting for at least 8 hours.   No results found.     Medical Problem List and Plan: 1.  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  Check oxygen saturations every shift.  Patient is no longer on isolation  -patient may shower  -ELOS/Goals: 18-21 days/supervision/min A.  Admit to CIR  Wean supplemental oxygen as tolerated 2.  Antithrombotics: -DVT/anticoagulation: Lovenox 85 mg daily.    -antiplatelet therapy:  Aspirin 81 mg daily 3. Pain Management: Advil as needed  Oxycodone as needed  Gabapentin 100 3 times daily started for neuropathic pain. 4. Mood: Provide emotional support  -antipsychotic agents: N/A 5. Neuropsych: This patient is capable of making decisions on his own behalf. 6. Skin/Wound Care:Santyl ointment applied full-thickness wound gluteal cleft daily routine skin checks.  Foam dressing bilateral buttock every 3 days 7. Fluids/Electrolytes/Nutrition: Routine in and outs.  CMP ordered. 8.  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  Patient completed 5-day course Solu-Medrol  Supportive care with range of motion  Recommend NCV/EMG as outpatient. 9.  New onset diabetes mellitus, hemoglobin A1c 7.2.  NovoLog 4 units 3 times daily, Levemir 15 units twice daily.  Provide diabetic teaching  Monitor with increased mobility 10.  Hypertension with tachycardia.  Lopressor 50 mg twice daily.    Monitor with increased mobility 11.  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  Dietary follow-up  Charlton Amor, PA-C 06/08/2020  I have personally performed a face to face diagnostic evaluation, including, but not limited to relevant history and physical exam findings, of this patient and developed relevant assessment and plan.  Additionally, I have reviewed and concur with the physician assistant's documentation above.  Maryla Morrow, MD, ABPMR The patient's status has not changed. Any changes from the pre-admission screening or documentation from the acute chart are noted above.   Maryla Morrow, MD, ABPMR

## 2020-06-09 ENCOUNTER — Inpatient Hospital Stay (HOSPITAL_COMMUNITY): Payer: BC Managed Care – PPO

## 2020-06-09 DIAGNOSIS — E1169 Type 2 diabetes mellitus with other specified complication: Secondary | ICD-10-CM | POA: Diagnosis not present

## 2020-06-09 DIAGNOSIS — I10 Essential (primary) hypertension: Secondary | ICD-10-CM | POA: Diagnosis not present

## 2020-06-09 DIAGNOSIS — S143XXS Injury of brachial plexus, sequela: Secondary | ICD-10-CM

## 2020-06-09 DIAGNOSIS — G7281 Critical illness myopathy: Secondary | ICD-10-CM

## 2020-06-09 DIAGNOSIS — E669 Obesity, unspecified: Secondary | ICD-10-CM

## 2020-06-09 LAB — GLUCOSE, CAPILLARY
Glucose-Capillary: 99 mg/dL (ref 70–99)
Glucose-Capillary: 102 mg/dL — ABNORMAL HIGH (ref 70–99)
Glucose-Capillary: 138 mg/dL — ABNORMAL HIGH (ref 70–99)
Glucose-Capillary: 129 mg/dL — ABNORMAL HIGH (ref 70–99)

## 2020-06-09 NOTE — Evaluation (Signed)
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 Critical illness myopathy   . Diabetes mellitus without complication ([place])   . Hearing loss in right ear   . Hypertension   . Sleep apnea    Past Surgical History:  Past Surgical History:  Procedure Laterality Date  . TONSILLECTOMY      Assessment & Plan Clinical Impression: Collin Brown is a 34 year old right-handed male with obstructive sleep apnea, morbid obesity BMI 51.88.  History taken from chart review, mother, and patient.  Patient lives with his mother and 77 year old nephew.  Reportedly independent prior to admission working as a Freight forwarder at [employer].  He presented on 05/03/2020 with increasing shortness of breath, cough, and low-grade fever.  Reportedly patient had recently traveled to [place] to visit his brother over the summer. .  Patient found to be COVID-19 positive.  He was treated with IV remdesivir and IV steroids.  His condition  did initially deteriorate and required intubation and ultimately self extubated on 05/20/2020 and transition to BiPAP.  Hospital course further complicated by acute metabolic encephalopathy.  CT/MRI unremarkable for acute intracranial process.  Patient did have some complaints of left shoulder pain with weakness and evaluated for possible brachial plexus mass. CT of the chest completed showing no  abnormality at the level of left brachial plexus.  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  He was placed on Lovenox for DVT prophylaxis.  Findings of elevated hemoglobin A1c 7.2 and placed on insulin therapy. WOC follow-up for pressure injury gluteal cleft with wound care as directed.  He is tolerating a mechanical soft thin liquid diet.  Patient transferred to CIR on 06/08/2020 .    Patient currently requires total with basic self-care skills secondary to muscle weakness, muscle joint tightness and muscle paralysis, decreased cardiorespiratoy endurance and decreased oxygen support, unbalanced muscle activation, decreased coordination and decreased motor planning and decreased sitting balance, decreased standing balance, decreased postural control and decreased balance strategies.  Prior to hospitalization, patient could complete ADLs and IADLs with independent .  Patient will benefit from skilled intervention to increase independence with basic self-care skills prior to discharge home with care partner.  Anticipate patient will require intermittent supervision and minimal physical assistance and follow up home health versus Outpatient OT.  OT - End of Session Activity Tolerance: Tolerates < 10 min activity with changes in vital signs Endurance Deficit: Yes Endurance Deficit Description: Requires extended rest breaks during simple functional mobility tasks while on 2L O2 OT Assessment Rehab Potential (ACUTE ONLY): Good OT Barriers to Discharge: Decreased caregiver support;Weight OT Barriers to Discharge Comments: Reports his mom has health issues; unable to physically assist pt. OT Patient demonstrates impairments in the following area(s): Balance;Sensory;Edema;Skin Integrity;Endurance;Motor;Pain OT Basic ADL's Functional Problem(s): Grooming;Eating;Bathing;Dressing;Toileting OT Advanced ADL's  Functional Problem(s): Simple Meal Preparation;Laundry;Light Housekeeping OT Transfers Functional Problem(s): Toilet;Tub/Shower OT Additional Impairment(s):  Fuctional Use of Upper Extremity OT Plan OT Intensity: Minimum of 1-2 x/day, 45 to 90 minutes OT Frequency: 5 out of 7 days OT Duration/Estimated Length of Stay: 3-4 weeks OT Treatment/Interventions: Balance/vestibular training;Discharge planning;Functional electrical stimulation;Pain management;Self Care/advanced ADL retraining;Therapeutic Activities;UE/LE Coordination activities;Disease mangement/prevention;Functional mobility training;Patient/family education;Skin care/wound managment;Therapeutic Exercise;DME/adaptive equipment instruction;Neuromuscular re-education;Psychosocial support;Splinting/orthotics;UE/LE Strength taining/ROM;Wheelchair propulsion/positioning OT Self Feeding Anticipated Outcome(s): mod I OT Basic Self-Care Anticipated Outcome(s): CGA OT Toileting Anticipated Outcome(s): CGA OT Bathroom Transfers Anticipated Outcome(s): CGA OT Recommendation Patient destination: Home Follow Up Recommendations: Home health OT;Outpatient OT (HH vs OP pending pt progress) Equipment Recommended: To be determined Equipment Details: Pt does not currently have any equipment   OT Evaluation Precautions/Restrictions  Precautions Precautions: Fall Precaution Comments: flaccid LUE (protect from subluxation); sling when OOB for comfort; skin integrity (sacral wound); monitor vitals Required Braces or Orthoses: Sling Restrictions Weight Bearing Restrictions: No General Chart Reviewed: Yes Pain Pain Assessment Pain Scale: 0-10 Pain Score: 8  Faces Pain Scale: Hurts even more Pain Type: Acute pain Pain Location: Arm Pain Orientation: Left Pain Descriptors / Indicators: Dull;Sharp;Tingling Pain Frequency: Intermittent Pain Onset: With Activity Patients Stated Pain Goal: 3 Pain Intervention(s): Medication (See eMAR) Multiple  Pain Sites: No 2nd Pain Site Wong-Baker Pain Rating: Hurts even more Pain Type: Acute pain Pain Location: Abdomen Pain Orientation: Anterior Pain Descriptors / Indicators: Sharp Pain Frequency: Intermittent Pain Onset: With Activity Pain Intervention(s): Repositioned;Rest Home Living/Prior Functioning Home Living Living Arrangements: Parent, Other relatives Available Help at Discharge: Family Type of Home: House Home Access: Stairs to enter Entrance Stairs-Number of Steps: 3-4 Entrance Stairs-Rails: None Home Layout: One level  Lives With: Other (Comment) (Mom and 60 y/o newphew) IADL History Homemaking Responsibilities: Yes Meal Prep Responsibility: Primary Laundry Responsibility: Primary Cleaning Responsibility: Primary Homemaking Comments: Pts mom has multiple health issues; pt did most of household IADLs Occupation: Full time employment Type of Occupation: Herbalist Prior Function Level of Independence: Independent with gait, Independent with transfers  Able to Take Stairs?: Yes Driving: Yes Vocation: Full time employment Vocation Requirements: Freight forwarder at [employer] Vision/History: No visual deficits Patient Visual Report: No change from baseline Vision Assessment?: No apparent visual deficits Perception  Perception: Within Functional Limits Praxis Praxis: Intact Cognition Overall Cognitive Status: Within Functional Limits for tasks assessed Arousal/Alertness: Awake/alert Orientation Level: Person;Place;Situation Person: Oriented Place: Oriented Situation: Oriented Year: 2021 Month: October Day of Week: Correct Memory: Appears intact Immediate Memory Recall: Sock;Blue;Bed Memory Recall Sock: Without Cue Memory Recall Blue: Without Cue Memory Recall Bed: Without Cue Attention: Focused;Sustained Focused Attention: Appears intact Sustained Attention: Appears intact Awareness: Appears intact Problem Solving: Appears  intact Safety/Judgment: Appears intact Sensation Sensation Light Touch: Impaired by gross assessment (Primarily LUE, radial side) Hot/Cold: Not tested Proprioception: Appears Intact Stereognosis: Not tested Coordination Gross Motor Movements are Fluid and Coordinated: No Fine Motor Movements are Fluid and Coordinated: No Coordination and Movement Description: Extremely effortful 2/2 body mass and significant deconditioning s/p COVID Finger Nose Finger Test: RUE WNL; UTA LUE due to flaccidity Motor  Motor Motor: Within Functional Limits Motor - Skilled Clinical Observations: General deconditioning s/p COVID  Trunk/Postural Assessment  Cervical Assessment Cervical Assessment: Exceptions to [facility] (forward head) Thoracic Assessment Thoracic Assessment: Exceptions to [facility] (mild kyphosis; flexible) Lumbar Assessment Lumbar Assessment: Exceptions to [facility] (sacral sitting) Postural Control Postural Control: Within Functional Limits  Balance Balance Balance Assessed: Yes Static Sitting Balance Static Sitting - Balance Support: Feet supported;No upper extremity supported Static Sitting - Level of Assistance: 5: Stand  by assistance Dynamic Sitting Balance Dynamic Sitting - Balance Support: During functional activity Dynamic Sitting - Level of Assistance: 4: Min assist Sitting balance - Comments: With BLE supported. able to maintain sitting EOB with SBA. He initially required minA for sitting balance while completing exercises, progressing to SBA with cues for forward weight shift Static Standing Balance Static Standing - Balance Support: Bilateral upper extremity supported (in [place]) Static Standing - Level of Assistance: 4: Min assist Extremity/Trunk Assessment RUE Assessment RUE Assessment: Exceptions to [facility] Passive Range of Motion (PROM) Comments: WNL Active Range of Motion (AROM) Comments: WNL General Strength Comments: RUE 4-/5 overall LUE Assessment LUE Assessment: Exceptions to  [facility] Passive Range of Motion (PROM) Comments: Shoulder FF and ABD (to 90 only due to positive sulcus sign), ER to (0) limited by pain; Elbow: full extension and flexion to (90) limited by pain; FA/wrist/hand WNL Active Range of Motion (AROM) Comments: No AROM left shoulder other than scapular elevation, elbow; full FA pronation AROM and wrist flexion and finger flexion AROM; no wrist extension or FA supination. General Strength Comments: 2-/5 scapular elevation; 3/5 finger flexion, wrist flexion, FA pronation; 0/5 shoulder all planes, elbow both planes, FA supination, wrist and digit extension  Care Tool Care Tool Self Care Eating   Eating Assist Level: Set up assist (bed level)    Oral Care    Oral Care Assist Level: Contact Guard/[place] assist (sitting EOB)    Bathing   Body parts bathed by patient: Chest;Abdomen;Front perineal area;Right upper leg;Left upper leg;Face Body parts bathed by helper: Right arm;Left arm;Buttocks;Left lower leg;Right lower leg   Assist Level: 2 Helpers    Upper Body Dressing(including orthotics)   What is the patient wearing?: Pull over shirt   Assist Level: Maximal Assistance - Patient 25 - 49%    Lower Body Dressing (excluding footwear)   What is the patient wearing?: Pants;Incontinence brief Assist for lower body dressing: 2 Helpers    Putting on/Taking off footwear   What is the patient wearing?: [place] for footwear: Dependent - Patient 0%       Care Tool Toileting Toileting activity   Assist for toileting: 2 Helpers     Care Tool Bed Mobility Roll left and right activity   Roll left and right assist level: 2 Helpers    Sit to lying activity   Sit to lying assist level: 2 Helpers    Lying to sitting edge of bed activity   Lying to sitting edge of bed assist level: 2 Helpers     Care Tool Transfers Sit to stand transfer   Sit to stand assist level: 2 Helpers    Chair/bed transfer Chair/bed transfer activity did not occur:  Safety/medical concerns       Toilet transfer Toilet transfer activity did not occur: Safety/medical concerns       Care Tool Cognition Expression of Ideas and Wants Expression of Ideas and Wants: Without difficulty (complex and basic) - expresses complex messages without difficulty and with speech that is clear and easy to understand   Understanding Verbal and Non-Verbal Content Understanding Verbal and Non-Verbal Content: Understands (complex and basic) - clear comprehension without cues or repetitions   Memory/Recall Ability *first 3 days only Memory/Recall Ability *first 3 days only: Current season;Location of own room;Staff names and faces;That he or she is in a hospital/hospital unit    Refer to Care Plan for [place] 1 OT Short Term Goal 1 (Week 1):  Pt will complete sit<>stand with mod assist +2 in preperation for LB ADLs OT Short Term Goal 2 (Week 1): Pt will achieve full PROM elbow flexion with minimal to no pain. OT Short Term Goal 3 (Week 1): Pt will complete UB dressing with mod assist. OT Short Term Goal 4 (Week 1): Pt will complete UB bathing using long handled sponge as needed with min assist.  Recommendations for other services: None    Skilled Therapeutic Intervention ADL ADL Upper Body Bathing: Maximal assistance Where Assessed-Upper Body Bathing: Edge of bed Lower Body Bathing: Other (comment) (+2 assist) Where Assessed-Lower Body Bathing: Edge of bed;Bed level (attempted to wash buttocks but quickly fatigued; therefore completed at bed level sidelying) Upper Body Dressing: Maximal assistance Where Assessed-Upper Body Dressing: Edge of bed Lower Body Dressing: Other (Comment) (attempted in standing but fatigued; bed level +2 total assist) Where Assessed-Lower Body Dressing: Edge of bed;Bed level Toileting: Dependent Where Assessed-Toileting: Bed level (urinal and incontinent of bowel) Mobility  Bed Mobility Bed Mobility: Rolling  Right;Rolling Left;Sit to Supine;Supine to Sit (Simultaneous filing. User may not have seen previous data.) Rolling Right: 2 Helpers (Simultaneous filing. User may not have seen previous data.) Rolling Left: Maximal Assistance - Patient 25-49% (Simultaneous filing. User may not have seen previous data.) Supine to Sit: Maximal Assistance - Patient - Patient 25-49% (Simultaneous filing. User may not have seen previous data.) Sit to Supine: 2 Helpers (Simultaneous filing. User may not have seen previous data.) Transfers Sit to Stand: 2 Helpers;Dependent - mechanical lift Stand to Sit: 2 Helpers   Skilled Intervention:  Pt supine in bed, no c/o pain at rest, pain with movement per above, agreeable to OT session.  Initial evaluation completed, collaborated with pt regarding POC and initial dc planning started, and reviewed rehab prognosis with pt.  Pt completed EOB self care and functional mobility per above levels of assist.  Assessed pts vitals throughout with pt on 2L of supplemental O2 via  and were the following:  Resting: pulse 109bpm, SPO2 94%; during seated self care: pulse 112bpm, SPO2 94%; during perched standing at stedy: pulse 138bpm, O2 94%; returned to supine recovery: pulse 88bpm, SPO2 92%.  Pt tolerated standing at [facility] x 2 trials for approx 10 seconds each then needing to return to sitting due to SOB and fatigue reported.  Pt returned to supine with HOB partially elevated, call bell in reach, bed alarm on.   Discharge Criteria: Patient will be discharged from OT if patient refuses treatment 3 consecutive times without medical reason, if treatment goals not met, if there is a change in medical status, if patient makes no progress towards goals or if patient is discharged from hospital.  The above assessment, treatment plan, treatment alternatives and goals were discussed and mutually agreed upon: by patient  Ezekiel Slocumb 06/09/2020, 12:41 PM

## 2020-06-09 NOTE — Evaluation (Signed)
Physical Therapy Assessment and Plan  Patient Details  Name: Collin Brown MRN: [id number] Date of Birth: Jul 17, 1986  PT Diagnosis: Abnormality of gait, Difficulty walking, Dizziness and giddiness, Edema, Impaired sensation, Muscle weakness and Pain in LUE Rehab Potential: Good ELOS: 3-4 weeks   Today's Date: 06/09/2020 PT Individual Time: 1100-1200 PT Individual Time Calculation (min): 60 min    Hospital Problem: Principal Problem:   Critical illness myopathy Active Problems:   Debility   Past Medical History:  Past Medical History:  Diagnosis Date  . Critical illness myopathy   . Diabetes mellitus without complication ([place])   . Hearing loss in right ear   . Hypertension   . Sleep apnea    Past Surgical History:  Past Surgical History:  Procedure Laterality Date  . TONSILLECTOMY      Assessment & Plan Clinical Impression: Patient is a 34 year old right-handed male with obstructive sleep apnea, morbid obesity BMI 51.88.  History taken from chart review, mother, and patient.  Patient lives with his mother and 68 year old nephew.  Reportedly independent prior to admission working as a Freight forwarder at [employer].  He presented on 05/03/2020 with increasing shortness of breath, cough, and low-grade fever.  Reportedly patient had recently traveled to [place] to visit his brother over the summer. .  Patient found to be COVID-19 positive.  He was treated with IV remdesivir and IV steroids.  His condition  did initially deteriorate and required intubation and ultimately self extubated on 05/20/2020 and transition to BiPAP.  Hospital course further complicated by acute metabolic encephalopathy.  CT/MRI unremarkable for acute intracranial process.  Patient did have some complaints of left shoulder pain with weakness and evaluated for possible brachial plexus mass. CT of the chest completed showing no abnormality at the level of left brachial plexus.  Neurology was consulted for left upper  extremity weakness started on 5-day course of high-dose IV steroids for possible brachial plexopathy.  He was placed on Lovenox for DVT prophylaxis.  Findings of elevated hemoglobin A1c 7.2 and placed on insulin therapy. WOC follow-up for pressure injury gluteal cleft with wound care as directed.  He is tolerating a mechanical soft thin liquid diet.  Therapy evaluations completed and patient was admitted for a comprehensive rehab program. Patient transferred to CIR on 06/08/2020 .   Patient currently requires max +2 with mobility secondary to muscle weakness, decreased cardiorespiratoy endurance and decreased oxygen support and decreased standing balance, decreased postural control and decreased balance strategies.  Prior to hospitalization, patient was independent  with mobility and lived with Other (Comment) (Mom and 15 y/o newphew) in a House home.  Home access is 3-4Stairs to enter.  Patient will benefit from skilled PT intervention to maximize safe functional mobility, minimize fall risk and decrease caregiver burden for planned discharge home with 24 hour assist.  Anticipate patient will HHPT vs OPPT vs cardiac rehab at discharge.  PT - End of Session Activity Tolerance: Tolerates 10 - 20 min activity with multiple rests Endurance Deficit: Yes Endurance Deficit Description: Requires extended rest breaks during simple functional mobility tasks while on 2L O2 PT Assessment Rehab Potential (ACUTE/IP ONLY): Good PT Barriers to Discharge: Decreased caregiver support;Lack of/limited family support;Home environment access/layout;Weight;New oxygen;Nutrition means PT Barriers to Discharge Comments: 3-4 steps with no raisl to enter home. PT Patient demonstrates impairments in the following area(s): Balance;Edema;Skin Integrity;Pain;Safety;Sensory;Motor;Endurance PT Transfers Functional Problem(s): Bed Mobility;Bed to Chair;Car PT Locomotion Functional Problem(s): Ambulation;Stairs PT Plan PT Intensity:  Minimum of 1-2 x/day ,45 to 90  minutes PT Frequency: 5 out of 7 days PT Duration Estimated Length of Stay: 3-4 weeks PT Treatment/Interventions: Ambulation/gait training;Discharge planning;Functional mobility training;Psychosocial support;Therapeutic Activities;Visual/perceptual remediation/compensation;Wheelchair propulsion/positioning;Therapeutic Exercise;Skin care/wound management;Neuromuscular re-education;Disease management/prevention;Balance/vestibular training;Cognitive remediation/compensation;DME/adaptive equipment instruction;Pain management;Splinting/orthotics;UE/LE Strength taining/ROM;UE/LE Coordination activities;Stair training;Patient/family education;Community reintegration;Functional electrical stimulation PT Transfers Anticipated Outcome(s): CGA with LRAD PT Locomotion Anticipated Outcome(s): minA with LRAD PT Recommendation Recommendations for Other Services: Neuropsych consult;Therapeutic Recreation consult Therapeutic Recreation Interventions: Stress management Follow Up Recommendations: 24 hour supervision/assistance Patient destination: Home Equipment Recommended: To be determined   PT Evaluation Precautions/Restrictions Precautions Precautions: Fall Precaution Comments: flaccid LUE (protect from subluxation); sling when OOB for comfort; skin integrity (sacral wound); monitor vitals Required Braces or Orthoses: Sling Restrictions Weight Bearing Restrictions: No Pain Pain Assessment Pain Scale: 0-10 Pain Score: 8  Faces Pain Scale: Hurts even more Pain Type: Acute pain Pain Location: Arm Pain Orientation: Left Pain Descriptors / Indicators: Dull;Sharp;Tingling Pain Frequency: Intermittent Pain Onset: With Activity Patients Stated Pain Goal: 3 Pain Intervention(s): Medication (See eMAR) Multiple Pain Sites: No 2nd Pain Site Wong-Baker Pain Rating: Hurts even more Pain Type: Acute pain Pain Location: Abdomen Pain Orientation: Anterior Pain Descriptors /  Indicators: Sharp Pain Frequency: Intermittent Pain Onset: With Activity Pain Intervention(s): Repositioned;Rest Home Living/Prior Functioning Home Living Available Help at Discharge: Family Type of Home: House Home Access: Stairs to enter Technical brewer of Steps: 3-4 Entrance Stairs-Rails: None Home Layout: One level  Lives With: Other (Comment) (Mom and 48 y/o newphew) Prior Function Level of Independence: Independent with gait;Independent with transfers  Able to Take Stairs?: Yes Driving: Yes Vocation: Full time employment Vocation Requirements: Freight forwarder at [employer] Perception: Within [place]: Intact  Cognition Overall Cognitive Status: Within Functional Limits for tasks assessed Arousal/Alertness: Awake/alert Orientation Level: Oriented X4 Attention: Focused;Sustained Focused Attention: Appears intact Sustained Attention: Appears intact Memory: Appears intact Immediate Memory Recall: Sock;Blue;Bed Memory Recall Sock: Without Cue Memory Recall Blue: Without Cue Memory Recall Bed: Without Cue Awareness: Appears intact Problem Solving: Appears intact Safety/Judgment: Appears intact Sensation Sensation Light Touch: Impaired by gross assessment (Primarily LUE, radial side) Hot/Cold: Not tested Proprioception: Appears Intact Stereognosis: Not tested Coordination Gross Motor Movements are Fluid and Coordinated: No Fine Motor Movements are Fluid and Coordinated: No Coordination and Movement Description: Extremely effortful 2/2 body mass and significant deconditioning s/p COVID Finger Nose Finger Test: RUE WNL; UTA LUE due to flaccidity Motor  Motor Motor: Within Functional Limits Motor - Skilled Clinical Observations: General deconditioning s/p COVID   Trunk/Postural Assessment  Cervical Assessment Cervical Assessment: Exceptions to [facility] (forward head) Thoracic Assessment Thoracic Assessment: Exceptions  to [facility] (mild kyphosis; flexible) Lumbar Assessment Lumbar Assessment: Exceptions to [facility] (sacral sitting) Postural Control Postural Control: Within Functional Limits  Balance Balance Balance Assessed: Yes Static Sitting Balance Static Sitting - Balance Support: Feet supported;No upper extremity supported Static Sitting - Level of Assistance: 5: Stand by assistance Dynamic Sitting Balance Dynamic Sitting - Balance Support: During functional activity Dynamic Sitting - Level of Assistance: 4: Min assist Sitting balance - Comments: With BLE supported. able to maintain sitting EOB with SBA. He initially required minA for sitting balance while completing exercises, progressing to SBA with cues for forward weight shift Static Standing Balance Static Standing - Balance Support: Bilateral upper extremity supported (in [place]) Static Standing - Level of Assistance: 4: Min assist Extremity Assessment  RUE Assessment RUE Assessment: Exceptions to [facility] Passive Range of Motion (PROM) Comments: WNL Active Range of Motion (AROM) Comments: WNL General Strength  Comments: RUE 4-/5 overall LUE Assessment LUE Assessment: Exceptions to [facility] Passive Range of Motion (PROM) Comments: Shoulder FF and ABD (to 90 only due to positive sulcus sign), ER to (0) limited by pain; Elbow: full extension and flexion to (90) limited by pain; FA/wrist/hand WNL Active Range of Motion (AROM) Comments: No AROM left shoulder other than scapular elevation, elbow; full FA pronation AROM and wrist flexion and finger flexion AROM; no wrist extension or FA supination. General Strength Comments: 2-/5 scapular elevation; 3/5 finger flexion, wrist flexion, FA pronation; 0/5 shoulder all planes, elbow both planes, FA supination, wrist and digit extension RLE Assessment RLE Assessment: Within Functional Limits General Strength Comments: Grossly 4/5 LLE Assessment LLE Assessment: Within Functional Limits General Strength Comments: Grossly  4/5  Care Tool Care Tool Bed Mobility Roll left and right activity   Roll left and right assist level: 2 Helpers    Sit to lying activity   Sit to lying assist level: 2 Helpers    Lying to sitting edge of bed activity   Lying to sitting edge of bed assist level: 2 Helpers     Care Tool Transfers Sit to stand transfer   Sit to stand assist level: 2 Helpers    Chair/bed transfer Chair/bed transfer activity did not occur: Safety/medical concerns       Toilet transfer Toilet transfer activity did not occur: Safety/medical concerns      Scientist, product/process development transfer activity did not occur: Safety/medical concerns        Care Tool Locomotion Ambulation Ambulation activity did not occur: Safety/medical concerns        Walk 10 feet activity Walk 10 feet activity did not occur: Safety/medical concerns       Walk 50 feet with 2 turns activity Walk 50 feet with 2 turns activity did not occur: Safety/medical concerns      Walk 150 feet activity Walk 150 feet activity did not occur: Safety/medical concerns      Walk 10 feet on uneven surfaces activity Walk 10 feet on uneven surfaces activity did not occur: Safety/medical concerns      Stairs Stair activity did not occur: Safety/medical concerns        Walk up/down 1 step activity Walk up/down 1 step or curb (drop down) activity did not occur: Safety/medical concerns     Walk up/down 4 steps activity did not occuR: Safety/medical concerns  Walk up/down 4 steps activity      Walk up/down 12 steps activity Walk up/down 12 steps activity did not occur: Safety/medical concerns      Pick up small objects from floor Pick up small object from the floor (from standing position) activity did not occur: Safety/medical concerns      Wheelchair Will patient use wheelchair at discharge?: No   Wheelchair activity did not occur: Safety/medical concerns      Wheel 50 feet with 2 turns activity Wheelchair 50 feet with 2 turns activity  did not occur: Safety/medical concerns    Wheel 150 feet activity Wheelchair 150 feet activity did not occur: Safety/medical concerns      Refer to Care Plan for Long Term Goals  SHORT TERM GOAL WEEK 1 PT Short Term Goal 1 (Week 1): Pt will perform bed mobility with maxA +1 person PT Short Term Goal 2 (Week 1): Pt will perform bed<>chair transfers with maxA and LRAD PT Short Term Goal 3 (Week 1): Pt will tolerate sitting in chair/recliner for >1 hour with stable vital signs PT  Short Term Goal 4 (Week 1): Pt will begin initiating pre-gait training with maxA and LRAD  Recommendations for other services: Neuropsych and Therapeutic Recreation  Stress management  Skilled Therapeutic Intervention Mobility Bed Mobility Bed Mobility: Rolling Right;Rolling Left;Sit to Supine;Supine to Sit (Simultaneous filing. User may not have seen previous data.) Rolling Right: 2 Helpers (Simultaneous filing. User may not have seen previous data.) Rolling Left: Maximal Assistance - Patient 25-49% (Simultaneous filing. User may not have seen previous data.) Supine to Sit: Maximal Assistance - Patient - Patient 25-49% (Simultaneous filing. User may not have seen previous data.) Sit to Supine: 2 Helpers (Simultaneous filing. User may not have seen previous data.) Transfers Transfers: Sit to Stand;Stand to Sit (Simultaneous filing. User may not have seen previous data.) Sit to Stand: 2 Helpers;Dependent - mechanical lift Stand to Sit: 2 Helpers Transfer via Lift Equipment: Stedy (Bariatric) Locomotion  Gait Ambulation: No Gait Gait: No Stairs / Additional Locomotion Stairs: No Wheelchair Mobility Wheelchair Mobility: No  Skilled Intervention: Pt received supine in bed, agreeable to PT session without c/o resting pain. On 2L oxygen, saturated at 95% with HR at 88bpm. Initiated functional mobility as outlined above. Completed bed level there-ex for preparation of OOB mobility. Completed SLR, heel slides,  ankle pumps, and SAQ, 1x10 reps each and able to complete AROM. Vitals stable throughout but did require rest breaks b/w sets for recovery. Required maxA +2 for rolling towards his L and totalA +2 for rolling R with HOB flat and no bedrails. Required maxA +2 for supine<>sit with HOB flat and no bedrails. He required assist for both trunk control and BLE management during supine<>sit. Initially requiring modA for sitting balance, progressing to SBA without BUE support but feet supported. Completed seated EOB there-ex including LAQ, hip marches and RUE arm raises, 1x10 each with AROM, again requiring seated rest breaks to recovery. Performed x3 sit<>stands with maxA +2 from raised EOB height with Bariatric Stedy. Performed standing trials to fatigue, lasting 10 seconds + 15 seconds + 30 seconds. Oxygen desaturating to 90% with HR as high as 115. After 3rd stand, pt c/o dizziness and general fatigue. BP assessed with him sitting in perched position on [place], reading 95/47. Deferred OOB mobility for safety concerns. Required maxA +2 for sit>supine and maxA +2 for repositioning in bed. Pt reports feeling better once lying supine. RN notified after session. Pt ended session semi-reclined in bed with needs in reach, bed alarm on, RN present at bedside.  Instructed pt in results of PT evaluation as detailed above, PT POC, rehab potential, rehab goals, and discharge recommendations. Additionally discussed CIR's policies regarding fall safety and use of chair alarm and/or quick release belt. Pt verbalized understanding and in agreement. Will update pt's family members as they become available.   Discharge Criteria: Patient will be discharged from PT if patient refuses treatment 3 consecutive times without medical reason, if treatment goals not met, if there is a change in medical status, if patient makes no progress towards goals or if patient is discharged from hospital.  The above assessment, treatment plan, treatment  alternatives and goals were discussed and mutually agreed upon: by patient  Alger Simons PT, DPT 06/09/2020, 12:39 PM

## 2020-06-09 NOTE — Progress Notes (Signed)
[facility] PHYSICAL MEDICINE & REHABILITATION PROGRESS NOTE   Subjective/Complaints: Had a pretty good night. Ready for therapy today. Left arm still sensitive and weak. Breathing well.   ROS: Patient denies fever, rash, sore throat, blurred vision, nausea, vomiting, diarrhea, cough, shortness of breath or chest pain,   headache, or mood change.    Objective:   No results found. Recent Labs    06/07/20 0341 06/08/20 0527  WBC 10.7* 11.2*  HGB 11.4* 11.3*  HCT 36.1* 35.8*  PLT 136* 136*   Recent Labs    06/07/20 0341 06/08/20 0527  NA 137 137  K 4.2 3.9  CL 99 99  CO2 29 30  GLUCOSE 178* 146*  BUN 30* 32*  CREATININE 0.44* <0.30*  CALCIUM 9.1 9.2    Intake/Output Summary (Last 24 hours) at 06/09/2020 1119 Last data filed at 06/09/2020 1000 Gross per 24 hour  Intake 840 ml  Output 3175 ml  Net -2335 ml     Pressure Injury 05/12/20 Toe (Comment  which one) Anterior;Right Stage 2 -  Partial thickness loss of dermis presenting as a shallow open injury with a red, pink wound bed without slough. THESE ARE NOT PRESSURE INJURIES; COVID TOES; RELATED TO VASCULOPAT (Active)  05/12/20 1930  Location: Toe (Comment  which one)  Location Orientation: Anterior;Right  Staging: Stage 2 -  Partial thickness loss of dermis presenting as a shallow open injury with a red, pink wound bed without slough.  Wound Description (Comments): THESE ARE NOT PRESSURE INJURIES; COVID TOES; RELATED TO VASCULOPATHY 161096  Present on Admission: No     Pressure Injury 05/12/20 Toe (Comment  which one) Anterior;Left Stage 2 -  Partial thickness loss of dermis presenting as a shallow open injury with a red, pink wound bed without slough. THESE ARE NOT PRESSURE INJURIES; COVID TOES; RELATED TO VASCULOPATH (Active)  05/12/20 1930  Location: Toe (Comment  which one)  Location Orientation: Anterior;Left  Staging: Stage 2 -  Partial thickness loss of dermis presenting as a shallow open injury with a red,  pink wound bed without slough.  Wound Description (Comments): THESE ARE NOT PRESSURE INJURIES; COVID TOES; RELATED TO VASCULOPATHY   Present on Admission: No    Physical Exam: Vital Signs Blood pressure (!) 148/102, pulse 87, temperature 98.1 F (36.7 C), resp. rate 18, height 5\' 10"  (1.778 m), weight (!) 163.9 kg, SpO2 92 %.  General: Alert and oriented x 3, No apparent distress. obese HEENT: Head is normocephalic, atraumatic, PERRLA, EOMI, sclera anicteric, oral mucosa pink and moist, dentition intact, ext ear canals clear,  Neck: Supple without JVD or lymphadenopathy Heart: Reg rate and rhythm. No murmurs rubs or gallops Chest: CTA bilaterally without wheezes, rales, or rhonchi; no distress. O2  Abdomen: Soft, non-tender, non-distended, bowel sounds positive. Extremities: No clubbing, cyanosis, or edema. Pulses are 2+ Skin: areas of eschar on bilateral great toes, both dry. Small 1.5-2cm stage ?II+ on sacrum with central fibronecrotic debris Neuro: Pt is cognitively appropriate with normal insight, memory, and awareness. Cranial nerves 2-12 are intact. DTR's 1+.  Fine motor coordination is intact. No tremors. Motor function is grossly 5/5 RUE. LUE weak in ABD/EE,WE, decreased sensation along lateral wrist and arm..  Musculoskeletal: Full ROM, No pain with AROM or PROM in the neck, trunk, or extremities. Posture appropriate Psych: Pt's affect is appropriate. Pt is cooperative     Assessment/Plan: 1. Functional deficits secondary to Debility/CIM after COVID which require 3+ hours per day of interdisciplinary therapy in a  comprehensive inpatient rehab setting.  Physiatrist is providing close team supervision and 24 hour management of active medical problems listed below.  Physiatrist and rehab team continue to assess barriers to discharge/monitor patient progress toward functional and medical goals  Care Tool:  Bathing    Body parts bathed by patient: Chest, Abdomen, Front  perineal area, Right upper leg, Left upper leg, Face   Body parts bathed by helper: Right arm, Left arm, Buttocks, Left lower leg, Right lower leg     Bathing assist Assist Level: 2 Helpers     Upper Body Dressing/Undressing Upper body dressing   What is the patient wearing?: Pull over shirt    Upper body assist Assist Level: Maximal Assistance - Patient 25 - 49%    Lower Body Dressing/Undressing Lower body dressing      What is the patient wearing?: Pants, Incontinence brief     Lower body assist Assist for lower body dressing: 2 Helpers     Toileting Toileting    Toileting assist Assist for toileting: 2 Helpers     Transfers Chair/bed transfer  Transfers assist           Locomotion Ambulation   Ambulation assist              Walk 10 feet activity   Assist           Walk 50 feet activity   Assist           Walk 150 feet activity   Assist           Walk 10 feet on uneven surface  activity   Assist           Wheelchair     Assist               Wheelchair 50 feet with 2 turns activity    Assist            Wheelchair 150 feet activity     Assist          Blood pressure (!) 148/102, pulse 87, temperature 98.1 F (36.7 C), resp. rate 18, height 5\' 10"  (1.778 m), weight (!) 163.9 kg, SpO2 92 %.  Medical Problem List and Plan: 1.  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  Check oxygen saturations every shift.  Patient is no longer on isolation             -patient may shower             -ELOS/Goals: 18-21 days/supervision/min A.             -beginning CIR therapies             Wean supplemental oxygen as tolerated 2.  Antithrombotics: -DVT/anticoagulation: Lovenox 85 mg daily.               -antiplatelet therapy: Aspirin 81 mg daily 3. Pain Management: Advil as needed             Oxycodone as needed              Gabapentin 100 3 times daily started for neuropathic pain---increase to 200mg  tid as has poor tactile tolerance of LUE. 4. Mood: Provide emotional support             -antipsychotic agents: N/A 5. Neuropsych: This patient is capable of making decisions on his own behalf. 6. Skin/Wound Care:Santyl ointment applied full-thickness wound gluteal cleft/sacrum. May  need hydrotherapy  - routine skin checks.   7. Fluids/Electrolytes/Nutrition: Routine in and outs.  CMP ordered. 8.  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  Patient completed 5-day course Solu-Medrol             Supportive care with range of motion             Recommend NCV/EMG as outpatient.  -increase gabapentin as abovef 9.  New onset diabetes mellitus, hemoglobin A1c 7.2.  NovoLog 4 units 3 times daily, Levemir 15 units twice daily.  Provide diabetic teaching             CBG (last 3)  Recent Labs    06/08/20 1634 06/08/20 2056 06/09/20 0609  GLUCAP 116* 162* 99   -reasonable control at present 10.  Hypertension with tachycardia.  Lopressor 50 mg twice daily.               Monitor with increased mobility 11.  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  Dietary follow-up    LOS: 1 days A FACE TO FACE EVALUATION WAS PERFORMED  Ranelle Oyster 06/09/2020, 11:19 AM

## 2020-06-09 NOTE — Progress Notes (Signed)
Dr. Riley Kill informed regarding patients BP readings of 99/58 supine, TEDs applied recheck BP 88/50, patient in sitting position BP 61/35.  Heart rate of 107.  Dr. Riley Kill stated, "Patient is orthostatic.  Apply abdominal binder when patient is out of bed, push fluids, have patient dangle prior to getting up."  Dr. Riley Kill also informed on patient being nauseous and spitting up phlegm.  RN ordered abdominal binder.  Patient informed and voiced understanding.

## 2020-06-09 NOTE — Progress Notes (Addendum)
Pt wore BPAP through the night until 0450. Pt placed back on oxygen per [place]

## 2020-06-09 NOTE — Progress Notes (Signed)
Orthopedic Tech Progress Note Patient Details:  Collin Brown 1986-03-25 [id number] Left bedside for patient. Ortho Devices Type of Ortho Device: Abdominal binder Ortho Device/Splint Interventions: Ordered       Collin Brown 06/09/2020, 2:08 PM

## 2020-06-09 NOTE — Progress Notes (Signed)
Occupational Therapy Session Note  Patient Details  Name: Collin Brown MRN: [id number] Date of Birth: 1985/08/18  Today's Date: 06/09/2020 OT Individual Time: 9407-6808 OT Individual Time Calculation (min): 32 min    Short Term Goals: Week 1:  OT Short Term Goal 1 (Week 1): Pt will complete sit<>stand with mod assist +2 in preperation for LB ADLs OT Short Term Goal 2 (Week 1): Pt will achieve full PROM elbow flexion with minimal to no pain. OT Short Term Goal 3 (Week 1): Pt will complete UB dressing with mod assist. OT Short Term Goal 4 (Week 1): Pt will complete UB bathing using long handled sponge as needed with min assist.  Skilled Therapeutic Interventions/Progress Updates:    Pt with HOB slighlty elevated in bed, flushed in face, just having finished lunch, reports he feels hot and nauseous.  OT assessed vitals at beginning of session:  BP 99/58, pulse 106, SPO2 on 2L via [place]: 93%;  Pt then with increased coughing and produced large quantity of phlegm.  OT donned bilateral TED hose to pt dependently at bed level and reassessed vitals:  BP: 88/50, pulse 108 SPO2: 92%;  Due to low BP reading, pt dependently repositioned to supine with BLE slightly elevated and vitals reassessed:  BP: 61/38, pulse 107, SPO2: 92%.  Nurse made aware of pts subtherapeutic vital ranges.  Per nurse, MD notified.  Remainder of OT session held due to abnormal vitals and pt fatigue. Pt made more comfortable with cold wash cloth applied to forehead and provided fan per pt request.  Call bell in reach, bed alarm on.  Missed 28 minutes of OT session.  Therapy Documentation Precautions:  Precautions Precautions: Fall Precaution Comments: flaccid LUE (protect from subluxation); sling when OOB for comfort; skin integrity (sacral wound); monitor vitals Required Braces or Orthoses: Sling Restrictions Weight Bearing Restrictions: No   Therapy/Group: Individual Therapy  Amie Critchley 06/09/2020, 4:46 PM

## 2020-06-10 DIAGNOSIS — S143XXS Injury of brachial plexus, sequela: Secondary | ICD-10-CM | POA: Diagnosis not present

## 2020-06-10 DIAGNOSIS — I10 Essential (primary) hypertension: Secondary | ICD-10-CM | POA: Diagnosis not present

## 2020-06-10 DIAGNOSIS — G7281 Critical illness myopathy: Secondary | ICD-10-CM | POA: Diagnosis not present

## 2020-06-10 DIAGNOSIS — E1169 Type 2 diabetes mellitus with other specified complication: Secondary | ICD-10-CM | POA: Diagnosis not present

## 2020-06-10 LAB — GLUCOSE, CAPILLARY
Glucose-Capillary: 90 mg/dL (ref 70–99)
Glucose-Capillary: 107 mg/dL — ABNORMAL HIGH (ref 70–99)
Glucose-Capillary: 140 mg/dL — ABNORMAL HIGH (ref 70–99)
Glucose-Capillary: 111 mg/dL — ABNORMAL HIGH (ref 70–99)

## 2020-06-10 NOTE — Progress Notes (Signed)
[place] PHYSICAL MEDICINE & REHABILITATION PROGRESS NOTE   Subjective/Complaints: Had dizziness, low bp's, n/v, cough while in later session with OT yesterday. No problems after episode however. Left arm still quite tender  ROS: Patient denies fever, rash, sore throat, blurred vision, nausea, vomiting, diarrhea, cough,   chest pain,  back pain, headache, or mood change.       Objective:   No results found. Recent Labs    06/08/20 0527  WBC 11.2*  HGB 11.3*  HCT 35.8*  PLT 136*   Recent Labs    06/08/20 0527  NA 137  K 3.9  CL 99  CO2 30  GLUCOSE 146*  BUN 32*  CREATININE <0.30*  CALCIUM 9.2    Intake/Output Summary (Last 24 hours) at 06/10/2020 1028 Last data filed at 06/10/2020 1011 Gross per 24 hour  Intake 924 ml  Output 3000 ml  Net -2076 ml     Pressure Injury 05/12/20 Toe (Comment  which one) Anterior;Right Stage 2 -  Partial thickness loss of dermis presenting as a shallow open injury with a red, pink wound bed without slough. THESE ARE NOT PRESSURE INJURIES; COVID TOES; RELATED TO VASCULOPAT (Active)  05/12/20 1930  Location: Toe (Comment  which one)  Location Orientation: Anterior;Right  Staging: Stage 2 -  Partial thickness loss of dermis presenting as a shallow open injury with a red, pink wound bed without slough.  Wound Description (Comments): THESE ARE NOT PRESSURE INJURIES; COVID TOES; RELATED TO VASCULOPATHY [id number]  Present on Admission: No     Pressure Injury 05/12/20 Toe (Comment  which one) Anterior;Left Stage 2 -  Partial thickness loss of dermis presenting as a shallow open injury with a red, pink wound bed without slough. THESE ARE NOT PRESSURE INJURIES; COVID TOES; RELATED TO VASCULOPATH (Active)  05/12/20 1930  Location: Toe (Comment  which one)  Location Orientation: Anterior;Left  Staging: Stage 2 -  Partial thickness loss of dermis presenting as a shallow open injury with a red, pink wound bed without slough.  Wound Description  (Comments): THESE ARE NOT PRESSURE INJURIES; COVID TOES; RELATED TO VASCULOPATHY   Present on Admission: No    Physical Exam: Vital Signs Blood pressure (!) 145/93, pulse 96, temperature 97.7 F (36.5 C), resp. rate 19, height 5\' 10"  (1.778 m), weight (!) 163.9 kg, SpO2 92 %.  Constitutional: No distress . Vital signs reviewed. HEENT: EOMI, oral membranes moist Neck: supple Cardiovascular: RRR without murmur. No JVD    Respiratory/Chest: CTA Bilaterally without wheezes or rales. Normal effort, O2 [place]    GI/Abdomen: BS +, non-tender, non-distended Ext: no clubbing, cyanosis. Psych: pleasant and cooperative Skin: areas of eschar on bilateral great toes, both dry. Small 1.5-2cm stage ?II+ on sacrum with central fibronecrotic debris--stable Neuro: Pt is cognitively appropriate with normal insight, memory, and awareness. Cranial nerves 2-12 are intact. DTR's 1+.  Fine motor coordination is intact. No tremors. Motor function is grossly 5/5 RUE. LUE weak in ABD/EE,WE, decreased sensation along lateral wrist and arm Left upper hyperalgesic.  Musculoskeletal: Full ROM, No pain with AROM or PROM in the neck, trunk, or extremities. Posture appropriate Psych: Pt's affect is appropriate. Pt is cooperative     Assessment/Plan: 1. 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  Physiatrist is providing close team supervision and 24 hour management of active medical problems listed below.  Physiatrist and rehab team continue to assess barriers to  discharge/monitor patient progress toward functional and medical goals  Care Tool:  Bathing    Body parts bathed by patient: Chest, Abdomen, Front perineal area, Right upper leg, Left upper leg, Face   Body parts bathed by helper: Right arm, Left arm, Buttocks, Left lower leg, Right lower leg     Bathing assist Assist Level: 2 Helpers      Upper Body Dressing/Undressing Upper body dressing   What is the patient wearing?: Hospital gown only    Upper body assist Assist Level: Moderate Assistance - Patient 50 - 74%    Lower Body Dressing/Undressing Lower body dressing      What is the patient wearing?: Incontinence brief     Lower body assist Assist for lower body dressing: 2 Helpers     Toileting Toileting    Toileting assist Assist for toileting: 2 Helpers     Transfers Chair/bed transfer  Transfers assist  Chair/bed transfer activity did not occur: Safety/medical concerns        Locomotion Ambulation   Ambulation assist   Ambulation activity did not occur: Safety/medical concerns          Walk 10 feet activity   Assist  Walk 10 feet activity did not occur: Safety/medical concerns        Walk 50 feet activity   Assist Walk 50 feet with 2 turns activity did not occur: Safety/medical concerns         Walk 150 feet activity   Assist Walk 150 feet activity did not occur: Safety/medical concerns         Walk 10 feet on uneven surface  activity   Assist Walk 10 feet on uneven surfaces activity did not occur: Safety/medical concerns         Wheelchair     Assist Will patient use wheelchair at discharge?: No   Wheelchair activity did not occur: Safety/medical concerns         Wheelchair 50 feet with 2 turns activity    Assist    Wheelchair 50 feet with 2 turns activity did not occur: Safety/medical concerns       Wheelchair 150 feet activity     Assist  Wheelchair 150 feet activity did not occur: Safety/medical concerns       Blood pressure (!) 145/93, pulse 96, temperature 97.7 F (36.5 C), resp. rate 19, height 5\' 10"  (1.778 m), weight (!) 163.9 kg, SpO2 92 %.  Medical Problem List and Plan: 1.  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  Check oxygen saturations every  shift.  Patient is no longer on isolation             -patient may shower             -ELOS/Goals: 18-21 days/supervision/min A.             -beginning CIR therapies             Weaning supplemental oxygen as tolerated 2.  Antithrombotics: -DVT/anticoagulation: Lovenox 85 mg daily.               -antiplatelet therapy: Aspirin 81 mg daily 3. Pain Management: Advil as needed             Oxycodone as needed             Gabapentin 100 3 times daily started for neuropathic pain---increased to 200mg  tid 10/30 as has poor activity tolerance with LUE 4. Mood:  Provide emotional support             -antipsychotic agents: N/A 5. Neuropsych: This patient is capable of making decisions on his own behalf. 6. Skin/Wound Care:Santyl ointment applied full-thickness wound gluteal cleft/sacrum. May need hydrotherapy to this small spot  - routine skin checks.   7. Fluids/Electrolytes/Nutrition: Routine in and outs.  CMP ordered. 8.  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  Patient completed 5-day course Solu-Medrol             Supportive care with range of motion             Recommend NCV/EMG as outpatient.  -increased gabapentin as abovef 9.  New onset diabetes mellitus, hemoglobin A1c 7.2.  NovoLog 4 units 3 times daily, Levemir 15 units twice daily.  Provide diabetic teaching             CBG (last 3)  Recent Labs    06/09/20 1636 06/09/20 2059 06/10/20 0632  GLUCAP 138* 129* 90   -reasonable control at present 10/31 10.  Hypertension with tachycardia.  Lopressor 50 mg twice daily.               10/31 pt hypotensive yesterday with OT which wasn't unexpected   -TEDS, Abd binder   -push po fluids   -continue to acclimate, get OOB   -reassured him that this is to be expected   -continue to monitor 11.  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  Dietary follow-up    LOS: 2 days A FACE TO FACE EVALUATION WAS PERFORMED  Ranelle Oyster 06/10/2020, 10:28 AM

## 2020-06-11 ENCOUNTER — Inpatient Hospital Stay (HOSPITAL_COMMUNITY): Payer: BC Managed Care – PPO

## 2020-06-11 ENCOUNTER — Other Ambulatory Visit: Payer: Self-pay

## 2020-06-11 ENCOUNTER — Encounter: Payer: Self-pay | Admitting: Neurology

## 2020-06-11 DIAGNOSIS — G7281 Critical illness myopathy: Secondary | ICD-10-CM | POA: Diagnosis not present

## 2020-06-11 DIAGNOSIS — R202 Paresthesia of skin: Secondary | ICD-10-CM

## 2020-06-11 LAB — CBC WITH DIFFERENTIAL/PLATELET
Abs Immature Granulocytes: 0.16 10*3/uL — ABNORMAL HIGH (ref 0.00–0.07)
Basophils Absolute: 0 10*3/uL (ref 0.0–0.1)
Basophils Relative: 0 %
Eosinophils Absolute: 0.1 10*3/uL (ref 0.0–0.5)
Eosinophils Relative: 1 %
HCT: 40.5 % (ref 39.0–52.0)
Hemoglobin: 12.6 g/dL — ABNORMAL LOW (ref 13.0–17.0)
Immature Granulocytes: 1 %
Lymphocytes Relative: 9 %
Lymphs Abs: 1.2 10*3/uL (ref 0.7–4.0)
MCH: 28.1 pg (ref 26.0–34.0)
MCHC: 31.1 g/dL (ref 30.0–36.0)
MCV: 90.4 fL (ref 80.0–100.0)
Monocytes Absolute: 0.4 10*3/uL (ref 0.1–1.0)
Monocytes Relative: 3 %
Neutro Abs: 11.9 10*3/uL — ABNORMAL HIGH (ref 1.7–7.7)
Neutrophils Relative %: 86 %
Platelets: 183 10*3/uL (ref 150–400)
RBC: 4.48 MIL/uL (ref 4.22–5.81)
RDW: 18.5 % — ABNORMAL HIGH (ref 11.5–15.5)
WBC: 13.8 10*3/uL — ABNORMAL HIGH (ref 4.0–10.5)
nRBC: 0 % (ref 0.0–0.2)

## 2020-06-11 LAB — COMPREHENSIVE METABOLIC PANEL
ALT: 58 U/L — ABNORMAL HIGH (ref 0–44)
AST: 22 U/L (ref 15–41)
Albumin: 3.2 g/dL — ABNORMAL LOW (ref 3.5–5.0)
Alkaline Phosphatase: 83 U/L (ref 38–126)
Anion gap: 10 (ref 5–15)
BUN: 17 mg/dL (ref 6–20)
CO2: 27 mmol/L (ref 22–32)
Calcium: 9.1 mg/dL (ref 8.9–10.3)
Chloride: 97 mmol/L — ABNORMAL LOW (ref 98–111)
Creatinine, Ser: 0.68 mg/dL (ref 0.61–1.24)
GFR, Estimated: >60 mL/min (ref >60)
Glucose, Bld: 183 mg/dL — ABNORMAL HIGH (ref 70–99)
Potassium: 4.2 mmol/L (ref 3.5–5.1)
Sodium: 134 mmol/L — ABNORMAL LOW (ref 135–145)
Total Bilirubin: 1.1 mg/dL (ref 0.3–1.2)
Total Protein: 5.9 g/dL — ABNORMAL LOW (ref 6.5–8.1)

## 2020-06-11 LAB — GLUCOSE, CAPILLARY
Glucose-Capillary: 104 mg/dL — ABNORMAL HIGH (ref 70–99)
Glucose-Capillary: 94 mg/dL (ref 70–99)
Glucose-Capillary: 102 mg/dL — ABNORMAL HIGH (ref 70–99)
Glucose-Capillary: 126 mg/dL — ABNORMAL HIGH (ref 70–99)

## 2020-06-11 NOTE — Progress Notes (Signed)
Inpatient Rehabilitation  Patient information reviewed and entered into eRehab system by Shey Bartmess M. Dayane Hillenburg, M.A., CCC/SLP, PPS Coordinator.  Information including medical coding, functional ability and quality indicators will be reviewed and updated through discharge.    

## 2020-06-11 NOTE — Care Management (Signed)
Inpatient Rehabilitation Center Individual Statement of Services  Patient Name:  CAMBREN HELM  Date:  06/11/2020  Welcome to the Inpatient Rehabilitation Center.  Our goal is to provide you with an individualized program based on your diagnosis and situation, designed to meet your specific needs.  With this comprehensive rehabilitation program, you will be expected to participate in at least 3 hours of rehabilitation therapies Monday-Friday, with modified therapy programming on the weekends.  Your rehabilitation program will include the following services:  Physical Therapy (PT), Occupational Therapy (OT), 24 hour per day rehabilitation nursing, Therapeutic Recreaction (TR), Psychology, Neuropsychology, Care Coordinator, Rehabilitation Medicine, Nutrition Services, Pharmacy Services and Other  Weekly team conferences will be held on Tuesdays to discuss your progress.  Your Inpatient Rehabilitation Care Coordinator will talk with you frequently to get your input and to update you on team discussions.  Team conferences with you and your family in attendance may also be held.  Expected length of stay: 3-4 weeks  Overall anticipated outcome: Supervision  Depending on your progress and recovery, your program may change. Your Inpatient Rehabilitation Care Coordinator will coordinate services and will keep you informed of any changes. Your Inpatient Rehabilitation Care Coordinator's name and contact numbers are listed  below.  The following services may also be recommended but are not provided by the Inpatient Rehabilitation Center:   Driving Evaluations  Home Health Rehabiltiation Services  Outpatient Rehabilitation Services  Vocational Rehabilitation   Arrangements will be made to provide these services after discharge if needed.  Arrangements include referral to agencies that provide these services.  Your insurance has been verified to be:  BCBS  Your primary doctor is:  No  PCP  Pertinent information will be shared with your doctor and your insurance company.  Inpatient Rehabilitation Care Coordinator:  Susie Cassette [phone] or [phone]  Information discussed with and copy given to patient by: Gretchen Short, 06/11/2020, 1:53 PM

## 2020-06-11 NOTE — Progress Notes (Signed)
Physical Therapy Session Note  Patient Details  Name: Collin Brown MRN: [id number] Date of Birth: 11-29-1985  Today's Date: 06/11/2020 PT Individual Time: 0900-1000 and 1300-1415 PT Individual Time Calculation (min): 60 min and 75 min  Short Term Goals: Week 1:  PT Short Term Goal 1 (Week 1): Pt will perform bed mobility with maxA +1 person PT Short Term Goal 2 (Week 1): Pt will perform bed<>chair transfers with maxA and LRAD PT Short Term Goal 3 (Week 1): Pt will tolerate sitting in chair/recliner for >1 hour with stable vital signs PT Short Term Goal 4 (Week 1): Pt will begin initiating pre-gait training with maxA and LRAD  Skilled Therapeutic Interventions/Progress Updates:     Session 1: Patient in bed on 2L/min O2 with RN in the room providing morning medications upon PT arrival. Patient alert and agreeable to PT session. Patient reported 9/10 L upper extremity pain during session, RN made aware and provided pain medication during session. PT provided repositioning, rest breaks, and distraction as pain interventions throughout session.   Discussed POC and functional progression following deconditioning post-COVID and L upper extremity pain management and motor control at beginning of session. Patient eager for improved functional mobility and reports wishing to return to work in December.    Orthostatic Vitals: Supine: BP 153/87, HR 103, SPO2 97% on 2L Sitting: BP 170/94, HR 112, SPO2 97% on 2L Sitting x3 min: BP 153/99, HR 111, SPO2 98% on 2L Standing: BP 145/101, HR 125, SPO2 96% on 2L RN made aware of vitals, patient received BP meds at beginning of session per RN.   Therapeutic Activity: Bed Mobility: Patient performed rolling R/L with min A and use of L bed rail to place abdominal binder. Donned pants bed level with max-total A, performed bridging x3 and used R hand to pull pants up while bridging. He performed supine to sit with mod A+2 for trunk support. Provided verbal  cues for rolling R and using his R arm to push up to his elbow then his hand for decreased assist with mobility. He performed sit to supine to sit with min A for leg management. Provided cues for management of L arm to protect his arm during mobility. L UE placed in a sling with patient sitting EOB with total A and supervision for sitting balance.  Transfers: Patient performed sit to/from stand from the bed using the [facility] x2 with mod A +2 and from the [facility] seat with min A +2 using R arm only. Provided verbal cues for scooting forward, forward weight shift, and deep breathing to reduce val salva maneuver with mobility. Patient performed standing balance x20-30 sec x3, on the last trial he became diaphoretic and nauseous and requested to return to lying, BP 163/101, HR 136, recovered to BP 153/99, HR 111 after 2 min of rest in lying.   Therapeutic Exercise: Patient performed the following exercises with verbal and tactile cues for proper technique. -supine B knee and hip flexion/extension against manual resistance 2x10 -seated B LAQ 2x10 with cues for controlled movement throughout full ROM  Patient in bed at end of session with breaks locked, bed alarm set, and all needs within reach. Retrieved TIS w/c with hybrid gel/foam Jay cushion at end of session to progress patient's sitting tolerance at end of session.   Patient on 2L/min O2 throughout session, SPO2 >94% throughout.   Session 2: Patient in bed on 2L/min O2 upon PT arrival. Patient alert and agreeable to PT session. Patient reported  2-4/10 L arm pain during session, RN made aware. PT provided repositioning, rest breaks, and distraction as pain interventions throughout session. TED hose were donned prior to session. Performed session without abdominal binder this afternoon due to elevated BP this morning.   Vitals: Supine beginning of session: BP 148/96, HR 92, SPO2 96% on 2L Trial on RA sitting up in the bed: >93% x4 min on RA at  rest Sitting: BP 151/100, HR 96, SPO2 96% on 2L After transfer to chair: BP 158/105, HR 96, SPO2 97% on 2L After gait training: BP 126/92, HR 96, SPO2 96% on 2L At end of session in sitting: BP 145/101, HR 96, SPO2 98% on 2L  Therapeutic Activity: Bed Mobility: Patient performed supine to sit with min A+2 for trunk support. Provided verbal cues for rolling R and using his R arm to push up to his elbow then his hand for decreased assist with mobility. Provided cues for management of L arm to protect his arm during mobility. L UE placed in a sling with patient sitting EOB with total A and supervision for sitting balance.  Transfers: Patient performed sit to/from stand from the bed using the [facility] with mod-min A +2 and from the [place] seat with min A of 1 person. He performed sit to/from stand in the // bars with mod A +2 using R arm only. Provided verbal cues for scooting forward, forward weight shift, and deep breathing to reduce val salva maneuver with mobility.   Gait Training:  Patient ambulated 6 steps, or 5 feet, in the // bars using his R upper extremity only with mod-min A +2 for safety/balance. Ambulated with significantly decreased gait speed, taking short standing breaks between each step, decreased step length and height, increased R arm use, and mild forward trunk lean. Provided verbal cues for paced breathing, guarded B knees in SLS to prevent buckling, but no signs of buckling occurred, and cued increased weight shift for improved step length and height of opposite foot. Patient reported modified RPE 8/10 rating of fatigue after, see vitals above.   Wheelchair Mobility:  Patient transferred to [facility] w/c with hybrid gel/foam Vonna Kotyk cushion for improved sitting tolerance and pressure relief due to pressure wound at gluteal cleft. Patient tolerated TIS w/c well during session. Patient was transported in the TIS w/c with total A throughout session for energy conservation and time management.  Transported patient outside at [facility] entrance during session to perform therapeutic exercise, see below, for improved patient moral. Patient tolerated sitting the TIS w/c >20 min during session without pain. Educated on calling for assist for pressure relief every 15-30 min in sitting to maintain patient's skin integrity when sitting OOB. Patient stated understanding.   Therapeutic Exercise: Patient performed the following exercises seated in TIS w/c with verbal and tactile cues for proper technique. -gross hand grip with L x10 progressing to full finger flexion and increased grip strength around therapists fingers -L elbow flexion extension x10, PROM for elbow flexion without activation, AAROM for extension with increased pronation to compensate for decreased triceps activation -B scapular retraction with 3 sec hold 2x10 with full AROM, educated on performing this exercise throughout the day -B LAQ with manual resistance x10 -alternated seated marching x10  Patient in TIS w/c with RN and NT in the room,requesting to sit OOB, at end of session with breaks locked and all needs within reach. Updated safety sheet for +2 transfers with Antony Salmon, RN and NT made aware.   Therapy Documentation Precautions:  Precautions Precautions: Fall Precaution Comments: flaccid LUE (protect from subluxation); sling when OOB for comfort; skin integrity (sacral wound); monitor vitals Required Braces or Orthoses: Sling Restrictions Weight Bearing Restrictions: No   Therapy/Group: Individual Therapy  Overton Boggus L Salvador Coupe PT, DPT  06/11/2020, 12:51 PM

## 2020-06-11 NOTE — Progress Notes (Signed)
[facility] PHYSICAL MEDICINE & REHABILITATION PROGRESS NOTE   Subjective/Complaints: No complaints this morning.  Denies pain.   ROS: Patient denies fever, rash, sore throat, blurred vision, nausea, vomiting, diarrhea, cough,   chest pain,  back pain, headache, or mood change.   Objective:   No results found. No results for input(s): WBC, HGB, HCT, PLT in the last 72 hours. No results for input(s): NA, K, CL, CO2, GLUCOSE, BUN, CREATININE, CALCIUM in the last 72 hours.  Intake/Output Summary (Last 24 hours) at 06/11/2020 0857 Last data filed at 06/11/2020 0300 Gross per 24 hour  Intake 1042 ml  Output 1925 ml  Net -883 ml     Pressure Injury 05/12/20 Toe (Comment  which one) Anterior;Right Stage 2 -  Partial thickness loss of dermis presenting as a shallow open injury with a red, pink wound bed without slough. THESE ARE NOT PRESSURE INJURIES; COVID TOES; RELATED TO VASCULOPAT (Active)  05/12/20 1930  Location: Toe (Comment  which one)  Location Orientation: Anterior;Right  Staging: Stage 2 -  Partial thickness loss of dermis presenting as a shallow open injury with a red, pink wound bed without slough.  Wound Description (Comments): THESE ARE NOT PRESSURE INJURIES; COVID TOES; RELATED TO VASCULOPATHY 063016  Present on Admission: No     Pressure Injury 05/12/20 Toe (Comment  which one) Anterior;Left Stage 2 -  Partial thickness loss of dermis presenting as a shallow open injury with a red, pink wound bed without slough. THESE ARE NOT PRESSURE INJURIES; COVID TOES; RELATED TO VASCULOPATH (Active)  05/12/20 1930  Location: Toe (Comment  which one)  Location Orientation: Anterior;Left  Staging: Stage 2 -  Partial thickness loss of dermis presenting as a shallow open injury with a red, pink wound bed without slough.  Wound Description (Comments): THESE ARE NOT PRESSURE INJURIES; COVID TOES; RELATED TO VASCULOPATHY   Present on Admission: No    Physical Exam: Vital Signs Blood  pressure (!) 148/96, pulse 97, temperature 98 F (36.7 C), resp. rate 18, height 5\' 10"  (1.778 m), weight (!) 163.9 kg, SpO2 93 %.   General: Alert, No apparent distress HEENT: Head is normocephalic, atraumatic, PERRLA, EOMI, sclera anicteric, oral mucosa pink and moist, dentition intact, ext ear canals clear,  Neck: Supple without JVD or lymphadenopathy Heart: Reg rate and rhythm. No murmurs rubs or gallops Chest: CTA bilaterally without wheezes, rales, or rhonchi; no distress Abdomen: Soft, non-tender, non-distended, bowel sounds positive. Extremities: No clubbing, cyanosis, or edema. Pulses are 2+ Psych: pleasant and cooperative Skin: areas of eschar on bilateral great toes, both dry. Small 1.5-2cm stage ?II+ on sacrum with central fibronecrotic debris--stable Neuro: Pt is cognitively appropriate with normal insight, memory, and awareness. Cranial nerves 2-12 are intact. DTR's 1+.  Fine motor coordination is intact. No tremors. Motor function is grossly 5/5 RUE. LUE weak in ABD/EE,WE, decreased sensation along lateral wrist and arm Left upper hyperalgesic.  Musculoskeletal: Full ROM, No pain with AROM or PROM in the neck, trunk, or extremities. Posture appropriate Psych: Pt's affect is appropriate. Pt is cooperative     Assessment/Plan: 1. 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  Physiatrist is providing close team supervision and 24 hour management of active medical problems listed below.  Physiatrist and rehab team continue to assess barriers to discharge/monitor patient progress toward functional and medical goals  Care Tool:  Bathing    Body parts bathed by  patient: Chest, Abdomen, Front perineal area, Right upper leg, Left upper leg, Face   Body parts bathed by helper: Right arm, Left arm, Buttocks, Left lower leg, Right lower leg     Bathing assist  Assist Level: 2 Helpers     Upper Body Dressing/Undressing Upper body dressing   What is the patient wearing?: Hospital gown only    Upper body assist Assist Level: Moderate Assistance - Patient 50 - 74%    Lower Body Dressing/Undressing Lower body dressing      What is the patient wearing?: Incontinence brief     Lower body assist Assist for lower body dressing: 2 Helpers     Toileting Toileting    Toileting assist Assist for toileting: Minimal Assistance - Patient > 75% (Assist the pt with the urinal)     Transfers Chair/bed transfer  Transfers assist  Chair/bed transfer activity did not occur: Safety/medical concerns        Locomotion Ambulation   Ambulation assist   Ambulation activity did not occur: Safety/medical concerns          Walk 10 feet activity   Assist  Walk 10 feet activity did not occur: Safety/medical concerns        Walk 50 feet activity   Assist Walk 50 feet with 2 turns activity did not occur: Safety/medical concerns         Walk 150 feet activity   Assist Walk 150 feet activity did not occur: Safety/medical concerns         Walk 10 feet on uneven surface  activity   Assist Walk 10 feet on uneven surfaces activity did not occur: Safety/medical concerns         Wheelchair     Assist Will patient use wheelchair at discharge?: No   Wheelchair activity did not occur: Safety/medical concerns         Wheelchair 50 feet with 2 turns activity    Assist    Wheelchair 50 feet with 2 turns activity did not occur: Safety/medical concerns       Wheelchair 150 feet activity     Assist  Wheelchair 150 feet activity did not occur: Safety/medical concerns       Blood pressure (!) 148/96, pulse 97, temperature 98 F (36.7 C), resp. rate 18, height 5\' 10"  (1.778 m), weight (!) 163.9 kg, SpO2 93 %.  Medical Problem List and Plan: 1.  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  Check oxygen saturations every shift.  Patient is no longer on isolation             -patient may shower             -ELOS/Goals: 18-21 days/supervision/min A.             -Continue CIR             Weaning supplemental oxygen as tolerated 2.  Antithrombotics: -DVT/anticoagulation: Lovenox 85 mg daily.               -antiplatelet therapy: Aspirin 81 mg daily 3. Pain Management: Advil as needed             Oxycodone as needed             Gabapentin 100 3 times daily started for neuropathic pain---increased to 200mg  tid 10/30 as has poor activity tolerance with LUE  Pain is currently well controlled 4. Mood: Provide emotional support             -  antipsychotic agents: N/A 5. Neuropsych: This patient is capable of making decisions on his own behalf. 6. Skin/Wound Care:Santyl ointment applied full-thickness wound gluteal cleft/sacrum. May need hydrotherapy to this small spot  - routine skin checks.   7. Fluids/Electrolytes/Nutrition: Routine in and outs.  CMP ordered. 8.  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  Patient completed 5-day course Solu-Medrol             Supportive care with range of motion             Recommend NCV/EMG as outpatient.  -increased gabapentin as above 9.  New onset diabetes mellitus, hemoglobin A1c 7.2.  NovoLog 4 units 3 times daily, Levemir 15 units twice daily.  Provide diabetic teaching             CBG (last 3)  Recent Labs    06/10/20 1612 06/10/20 2116 06/11/20 0614  GLUCAP 140* 111* 104*   -reasonable control 11/1 10.  Hypertension with tachycardia.  Lopressor 50 mg twice daily.               10/31 pt hypotensive yesterday with OT which wasn't unexpected   -TEDS, Abd binder   -push po fluids   -continue to acclimate, get OOB   -reassured him that this is to be expected   -continue to monitor  11/1: BP is slightly elevated at rest.  11.  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  Dietary  follow-up    LOS: 3 days A FACE TO FACE EVALUATION WAS PERFORMED  Drema Pry Newell Frater 06/11/2020, 8:57 AM

## 2020-06-11 NOTE — Plan of Care (Signed)
  Problem: Consults Goal: RH GENERAL PATIENT EDUCATION Description: See Patient Education module for education specifics. Outcome: Progressing Goal: Skin Care Protocol Initiated - if Braden Score 18 or less Description: If consults are not indicated, leave blank or document N/A Outcome: Progressing Goal: Nutrition Consult-if indicated Outcome: Progressing Goal: Diabetes Guidelines if Diabetic/Glucose > 140 Description: If diabetic or lab glucose is > 140 mg/dl - Initiate Diabetes/Hyperglycemia Guidelines & Document Interventions  Outcome: Progressing   Problem: RH BOWEL ELIMINATION Goal: RH STG MANAGE BOWEL WITH ASSISTANCE Description: STG Manage Bowel with  mod Assistance. Outcome: Progressing Goal: RH STG MANAGE BOWEL W/MEDICATION W/ASSISTANCE Description: STG Manage Bowel with Medication with Assistance. Outcome: Progressing   Problem: RH SKIN INTEGRITY Goal: RH STG SKIN FREE OF INFECTION/BREAKDOWN Outcome: Progressing Goal: RH STG MAINTAIN SKIN INTEGRITY WITH ASSISTANCE Description: STG Maintain Skin Integrity With  mod Assistance. Outcome: Progressing Goal: RH STG ABLE TO PERFORM INCISION/WOUND CARE W/ASSISTANCE Description: STG Able To Perform Incision/Wound Care With mod Assistance. Outcome: Progressing   Problem: RH SAFETY Goal: RH STG ADHERE TO SAFETY PRECAUTIONS W/ASSISTANCE/DEVICE Description: STG Adhere to Safety Precautions With  mod Assistance/Device. Outcome: Progressing   Problem: RH PAIN MANAGEMENT Goal: RH STG PAIN MANAGED AT OR BELOW PT'S PAIN GOAL Description: Pain free and <3  Outcome: Progressing   

## 2020-06-11 NOTE — Progress Notes (Signed)
RT placed patient on BIPAP with 2L O2 bled into circuit. Patient tolerating BIPAP settings well at this time. RT will monitor as needed.

## 2020-06-11 NOTE — Progress Notes (Signed)
Occupational Therapy Session Note  Patient Details  Name: Collin Brown MRN: [id number] Date of Birth: Feb 03, 1986  Today's Date: 06/11/2020 OT Individual Time: 1638-4536 OT Individual Time Calculation (min): 55 min   Session 2: OT Individual Time: 4680-3212 OT Individual Time Calculation (min): 24 min    Short Term Goals: Week 1:  OT Short Term Goal 1 (Week 1): Pt will complete sit<>stand with mod assist +2 in preperation for LB ADLs OT Short Term Goal 2 (Week 1): Pt will achieve full PROM elbow flexion with minimal to no pain. OT Short Term Goal 3 (Week 1): Pt will complete UB dressing with mod assist. OT Short Term Goal 4 (Week 1): Pt will complete UB bathing using long handled sponge as needed with min assist.  Skilled Therapeutic Interventions/Progress Updates:    Pt received supine with c/o nerve pain in his LUE. Brief assessment done of the LUE. The hand and wrist has obvious edema, a weak gross grasp, poor activation of wrist extension, little activation of the bicep/tricep, and little to no shoulder flexion or abduction. Pt reported pain with palpation and movement. Supine BP 137/87, pt on 2L [place] at rest and HR 111 bpm. Pt transitioned to EOB with CGA, heavy use of bed rails and HOB elevated. Once EOB pt was able to maintain sitting balance with supervision. Edu/cueing provided for hemi dressing techniques. Min A required to doff shirt and min A to wash UB. Demo provided for hemi bathing technique. Mod A to don new shirt. Pt was able to maintain SpO2 ~91% off of nasal canula for several minutes while performing dressing. Pt completed sit > stand in the stedy. Initially bed raised only slightly and pt unable to power up with max +2. Bed height raised considerably and pt was able to stand with mod +2 using stedy. HR did increase to 145 bpm following this stand he was instructed to take an extended rest break to allow HR to recover. No c/o dizziness throughout session. Pt stood again in the  stedy and required mod A for peri hygiene and total A to don new brief. Pt returned to sitting on bed. Declined donning pants 2/2 fatigue progressing. Pt returned to supine with min A. BP obtained once supine- now 131/104. Pt reported feeling like he needed "a little bit of sugar" and was provided one cup of OJ. Pt was left supine with all needs met, bed alarm set.    Session 2:  Pt received supine, c/o 5/10 nerve pain in his LUE and reporting fatigue from earlier sessions. Pt agreeable to bed level activity focused on LUE desensitization and PROM. Edu provided re LUE care, positioning, and potential recovery. Pt was able to tolerate both heat and cold application to the LUE without increase in pain, and was able to localize temperature with decreased intensity. Light touch with a cotton ball was also tolerated with improvement from earlier sessions. Pt tolerated elbow PROM with distraction used to decrease anticipatory pain response. Pt completed gross grasp flex/ext with foam block x 10 repetitions. Pt was left supine with all needs met, bed alarm set.   Therapy Documentation Precautions:  Precautions Precautions: Fall Precaution Comments: flaccid LUE (protect from subluxation); sling when OOB for comfort; skin integrity (sacral wound); monitor vitals Required Braces or Orthoses: Sling Restrictions Weight Bearing Restrictions: No   Therapy/Group: Individual Therapy  Curtis Sites 06/11/2020, 6:45 AM

## 2020-06-11 NOTE — IPOC Note (Signed)
Overall Plan of Care [facility]) Patient Details Name: DEWIE AHART MRN: [id number] DOB: 11-21-1985  Admitting Diagnosis: Critical illness myopathy  Hospital Problems: Principal Problem:   Critical illness myopathy Active Problems:   Debility     Functional Problem List: Nursing Bladder, Bowel, Motor, Pain, Safety, Skin Integrity  PT Balance, Edema, Skin Integrity, Pain, Safety, Sensory, Motor, Endurance  OT Balance, Sensory, Edema, Skin Integrity, Endurance, Motor, Pain  SLP    TR         Basic ADL's: OT Grooming, Eating, Bathing, Dressing, Toileting     Advanced  ADL's: OT Simple Meal Preparation, Laundry, Light Housekeeping     Transfers: PT Bed Mobility, Bed to Chair, Customer service manager, Tub/Shower     Locomotion: PT Ambulation, Stairs     Additional Impairments: OT Fuctional Use of Upper Extremity  SLP        TR      Anticipated Outcomes Item Anticipated Outcome  Self Feeding mod I  Swallowing      Basic self-care  CGA  Toileting  CGA   Bathroom Transfers CGA  Bowel/Bladder  remain continent (remain continent)  Transfers  CGA with LRAD  Locomotion  minA with LRAD  Communication     Cognition     Pain  <3  Safety/Judgment  maxi assist   Therapy Plan: PT Intensity: Minimum of 1-2 x/day ,45 to 90 minutes PT Frequency: 5 out of 7 days PT Duration Estimated Length of Stay: 3-4 weeks OT Intensity: Minimum of 1-2 x/day, 45 to 90 minutes OT Frequency: 5 out of 7 days OT Duration/Estimated Length of Stay: 3-4 weeks     Due to the current state of emergency, patients may not be receiving their 3-hours of Medicare-mandated therapy.   Team Interventions: Nursing Interventions Patient/Family Education, Disease Management/Prevention, Pain Management, Medication Management, Skin Care/Wound Management  PT interventions Ambulation/gait training, Discharge planning, Functional mobility training, Psychosocial support, Therapeutic Activities,  Visual/perceptual remediation/compensation, Wheelchair propulsion/positioning, Therapeutic Exercise, Skin care/wound management, Neuromuscular re-education, Disease management/prevention, Warden/ranger, Cognitive remediation/compensation, DME/adaptive equipment instruction, Pain management, Splinting/orthotics, UE/LE Strength taining/ROM, UE/LE Coordination activities, Stair training, Patient/family education, Community reintegration, Development worker, international aid stimulation  OT Interventions Warden/ranger, Discharge planning, Functional electrical stimulation, Pain management, Self Care/advanced ADL retraining, Therapeutic Activities, UE/LE Coordination activities, Disease mangement/prevention, Functional mobility training, Patient/family education, Skin care/wound managment, Therapeutic Exercise, DME/adaptive equipment instruction, Neuromuscular re-education, Psychosocial support, Splinting/orthotics, UE/LE Strength taining/ROM, Wheelchair propulsion/positioning  SLP Interventions    TR Interventions    SW/CM Interventions Discharge Planning, Psychosocial Support, Patient/Family Education   Barriers to Discharge MD  Medical stability  Nursing Decreased caregiver support, Wound Care, Weight    PT Decreased caregiver support, Lack of/limited family support, Home environment access/layout, Weight, New oxygen, Nutrition means 3-4 steps with no raisl to enter home.  OT Decreased caregiver support, Weight Reports his mom has health issues; unable to physically assist pt.  SLP      SW Decreased caregiver support, Lack of/limited family support     Team Discharge Planning: Destination: PT-Home ,OT- Home , SLP-  Projected Follow-up: PT-24 hour supervision/assistance, OT-  Home health OT, Outpatient OT (HH vs OP pending pt progress), SLP-  Projected Equipment Needs: PT-To be determined, OT- To be determined, SLP-  Equipment Details: PT- , OT-Pt does not currently have any  equipment Patient/family involved in discharge planning: PT- Patient unable/family or caregiver not available,  OT-Patient, SLP-   MD ELOS: 18-21 days S/MinA Medical Rehab Prognosis:  Good Assessment: Mr. 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 We are weaning his supplemental oxygen as tolerated.  He is on DVT prophylaxis of Lovenox 85mg  daily. His pain is being well controlled with oxycodone and Gabapentin. He is receiving daily wound care to his full thickness gluteal wound. He is on insuline for new onset diabetes mellitus and is receiving diabetic teaching. He is on lopressor for hypertension with tachycardia, but has been hypotensive with therapy- using TEDs and abdominal binder during therapy sessions.   See Team Conference Notes for weekly updates to the plan of care

## 2020-06-12 ENCOUNTER — Inpatient Hospital Stay (HOSPITAL_COMMUNITY): Payer: BC Managed Care – PPO

## 2020-06-12 DIAGNOSIS — G7281 Critical illness myopathy: Secondary | ICD-10-CM | POA: Diagnosis not present

## 2020-06-12 DIAGNOSIS — S143XXS Injury of brachial plexus, sequela: Secondary | ICD-10-CM | POA: Diagnosis not present

## 2020-06-12 DIAGNOSIS — I10 Essential (primary) hypertension: Secondary | ICD-10-CM | POA: Diagnosis not present

## 2020-06-12 DIAGNOSIS — E1169 Type 2 diabetes mellitus with other specified complication: Secondary | ICD-10-CM | POA: Diagnosis not present

## 2020-06-12 LAB — GLUCOSE, CAPILLARY
Glucose-Capillary: 118 mg/dL — ABNORMAL HIGH (ref 70–99)
Glucose-Capillary: 117 mg/dL — ABNORMAL HIGH (ref 70–99)
Glucose-Capillary: 119 mg/dL — ABNORMAL HIGH (ref 70–99)
Glucose-Capillary: 109 mg/dL — ABNORMAL HIGH (ref 70–99)

## 2020-06-12 MED ORDER — GABAPENTIN 300 MG PO CAPS
300.0000 mg | ORAL_CAPSULE | Freq: Three times a day (TID) | ORAL | Status: DC
  Administered 2020-06-12 – 2020-06-13 (×3): 300 mg via ORAL
  Filled 2020-06-12 (×3): qty 1

## 2020-06-12 NOTE — Patient Care Conference (Signed)
Inpatient RehabilitationTeam Conference and Plan of Care Update Date: 06/12/2020   Time: 10:00 AM    Patient Name: Collin Brown      Medical Record Number: [id number]  Date of Birth: March 27, 1986 Sex: Male         Room/Bed: 4W16C/4W16C-01 Payor Info: Payor: BLUE CROSS BLUE [employer]IELD / Plan: BCBS COMM PPO / Product Type: *No Product type* /    Admit Date/Time:  06/08/2020 12:01 PM  Primary Diagnosis:  Critical illness myopathy  Hospital Problems: Principal Problem:   Critical illness myopathy Active Problems:   Debility    Expected Discharge Date: Expected Discharge Date: 06/29/20  Team Members Present: Physician leading conference: Dr. Faith Rogue Care Coodinator Present: Cecile Sheerer, LCSWA;Adamaris King [name], RN, BSN, CRRN Nurse Present: Adam Phenix, LPN PT Present: Serina Cowper, PT OT Present: Jake Shark, OT PPS Coordinator present : Edson Snowball, Park Breed, SLP     Current Status/Progress Goal Weekly Team Focus  Bowel/Bladder   Continent bowel and bladder  remain continent  call for assist   Swallow/Nutrition/ Hydration             ADL's   Mod A UB dressing, max +2 LB dressing, mod +2 stedy for transfers, tachycardia, poor activity tolerance  CGA overall  functional activity tolerance, weaning O2, ADL retraining, transfers, LUE NMR   Mobility   Mod A +2 overall, Stedy for transfers, gait 5 ft in // bars, elevated BP and HR with mobility  Min A-CGA overall  Activity tolerance, functional mobility, balance, strengthening/ROM, L upper extremity NMR, gait and stair training, patient/caregiver education   Communication             Safety/Cognition/ Behavioral Observations            Pain   no complaints of pain  <3 on a 0-10 pain scale  assess q 4 hr. and prn   Skin   unstageable to cocyxx, wounds to toes  no new breakdown while on CIR  assess skin q shift and prn     Discharge Planning:  D/c to home with support from his mother (supervision)  and nephew (physical assistance).   Team Discussion: Continent bowel and bladder. Pressure injury to coccyx, wounds to toes, follow MD orders. OT reports patient is mod assist upper body ADL's . Has poor activity tolerance. Contact guard goals. PT reports patient is mod assist to stand. Has min assist to contact guard goals. Patient on target to meet rehab goals: yes  *See Care Plan and progress notes for long and short-term goals.   Revisions to Treatment Plan:  None at this time.  Teaching Needs: Continue family education with wife and teach wound care as well.  Current Barriers to Discharge: Decreased caregiver support, Wound care, New oxygen and endurance.  Possible Resolutions to Barriers: Continues education on wound care and dressing changes, continue with current medication regimen, work on building endurance and weaning oxygen.     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 still oxygen dependent. bp issues with activity  Barriers to Discharge: Medical stability   Possible Resolutions to [employer] Focus: stabilizing bp, pain mgt, weaning oxygen, daily mgt of labs and review of patient data   Continued Need for Acute Rehabilitation Level of Care: The patient requires daily medical management by a physician with specialized training in physical medicine and rehabilitation for the following reasons: Direction of a multidisciplinary physical rehabilitation program to maximize functional independence : Yes Medical management  of patient stability for increased activity during participation in an intensive rehabilitation regime.: Yes Analysis of laboratory values and/or radiology reports with any subsequent need for medication adjustment and/or medical intervention. : Yes   I attest that I was present, lead the team conference, and concur with the assessment and plan of the team.   Tennis Must 06/12/2020, 2:06 PM

## 2020-06-12 NOTE — Progress Notes (Signed)
Physical Therapy Session Note  Patient Details  Name: Collin Brown MRN: [id number] Date of Birth: 11-Nov-1985  Today's Date: 06/12/2020 PT Individual Time: 0802-0928 PT Individual Time Calculation (min): 86 min   Short Term Goals: Week 1:  PT Short Term Goal 1 (Week 1): Pt will perform bed mobility with maxA +1 person PT Short Term Goal 2 (Week 1): Pt will perform bed<>chair transfers with maxA and LRAD PT Short Term Goal 3 (Week 1): Pt will tolerate sitting in chair/recliner for >1 hour with stable vital signs PT Short Term Goal 4 (Week 1): Pt will begin initiating pre-gait training with maxA and LRAD  Skilled Therapeutic Interventions/Progress Updates:     Patient in bed upon PT arrival. Patient alert and agreeable to PT session. Patient reported 7/10 L  pain during session, RN made aware. PT provided repositioning, rest breaks, and distraction as pain interventions throughout session.   Vitals: Supine: BP 143/84, HR 98, SPO2 97% 2 L Sitting: BP 137/90, HR 101, SPO2 97% 2 L Standing: BP 153/115, HR 128, SPO2 96% 2L, recovered to HR 103 <2 min at rest  Therapeutic Activity: Bed Mobility: Donned B thigh high TED hose, pants, and non-skid socks with total A bed level. Patient performed bridging x3 to allow PT to pull pants over his hip with a second person assist. Patient performed supine to sit with min-mod A in a flat bed without use of bed rail. Provided verbal cues for bringing his L arm across to roll on his R side and set his elbow to push up to sitting. Transfers: Patient performed a stand pivot bed>TIS w/c without an AD with mod A of 1 person and min-CGA of a second person for safety/balance. Provided cues for sequencing and R hand placement. He performed sit to/from stand x3 from the TIS w/c using R rail with mod A x1 progressing to min A-CGA x2. Provided verbal cues for forward weight shift before pushing through his legs to stand for improved balance and placement of COM over his  BOS. Performed stand pivot transfer TIS w/c>BSC over the toilet with mod-min A using R hand on grab bar. Patient was continent of bowl during toileting. Attempted to stand in the [facility] for peri-care, however, due to space constraints and increased fatigue at end of session, patient was unable to stand with max A and a second person was unable to safely assist. Used Maxi-move to transfer patient from BSC>bed. Handed off to NT to perform peri-care and to don pants.   Gait Training:  Patient ambulated 11 feet (RPE 7/10, HR 107, SPO2 96% on 2L) and 15 (REP 6.5-7/10, HR 109, SPO2 97% on 2L) feet forward and 7 feet backwards (RPE 7/10, HR 103, SPO2 96% on 2L) using R rail with min-mod A of 1 person and a second person following with a w/c and O2 tank managment. Ambulated with decreased gait speed, decreased step length and height, knee hyperextension in stance due to decreased strength L>R, performed step-to gait pattern self selecting to lead with L. Provided verbal cues for reduced hyperextension in stance and tactile cues for eccentric hamstring control and erect posture and paced breathing for improved breath support throughout.  Wheelchair Mobility:  Patient was transported in the w/c with total A throughout session for energy conservation and time management.  Patient in the room handed off to NT at end of session.    Therapy Documentation Precautions:  Precautions Precautions: Fall Precaution Comments: flaccid LUE (protect from subluxation); sling  when OOB for comfort; skin integrity (sacral wound); monitor vitals Required Braces or Orthoses: Sling Restrictions Weight Bearing Restrictions: No   Therapy/Group: Individual Therapy  Masie Bermingham L [place] Rosamond PT, DPT  06/12/2020, 10:08 AM

## 2020-06-12 NOTE — Consult Note (Signed)
WOC Nurse wound follow up Patient receiving care in [facility] 4W16.  Patient able to turn self in bed for wound assessment. Has difficulty moving LUE. Wound type: Unstageable to coccyx, deep in gluteal cleft.  Measures 2.2 cm x 1.7 cm. Right mandible has a dry, crusted area that measures 1 cm x 1.5 cm.  The patient has a full beard, which makes clearly seeing the wound more difficult.  But I could not express any drainage, nor see any erythema surrounding the wound.   Measurement: Wound bed: Drainage (amount, consistency, odor)  Periwound: intact for both Dressing procedure/placement/frequency: Continue the existing therapy on order for both of these wounds.  Monitor the wound area(s) for worsening of condition such as: Signs/symptoms of infection,  Increase in size,  Development of or worsening of odor, Development of pain, or increased pain at the affected locations.  Notify the medical team if any of these develop. Helmut Muster, RN, MSN, CWOCN, CNS-BC, pager [phone]

## 2020-06-12 NOTE — Progress Notes (Signed)
[place] PHYSICAL MEDICINE & REHABILITATION PROGRESS NOTE   Subjective/Complaints: Up in chair working with PT. BPs high with activity this morning. Left arm still tender.   ROS: Patient denies fever, rash, sore throat, blurred vision, nausea, vomiting, diarrhea, cough,  chest pain, joint or back pain, headache, or mood change.    Objective:   No results found. Recent Labs    06/11/20 0841  WBC 13.8*  HGB 12.6*  HCT 40.5  PLT 183   Recent Labs    06/11/20 0841  NA 134*  K 4.2  CL 97*  CO2 27  GLUCOSE 183*  BUN 17  CREATININE 0.68  CALCIUM 9.1    Intake/Output Summary (Last 24 hours) at 06/12/2020 1241 Last data filed at 06/12/2020 0148 Gross per 24 hour  Intake 940 ml  Output 1700 ml  Net -760 ml     Pressure Injury 06/08/20 Coccyx Unstageable - Full thickness tissue loss in which the base of the injury is covered by slough (yellow, tan, gray, green or brown) and/or eschar (tan, brown or black) in the wound bed. yellow necrotic tissue in the crease o (Active)  06/08/20 1830 (with Lizabeth Leyden RN)  Location: Coccyx  Location Orientation:   Staging: Unstageable - Full thickness tissue loss in which the base of the injury is covered by slough (yellow, tan, gray, green or brown) and/or eschar (tan, brown or black) in the wound bed.  Wound Description (Comments): yellow necrotic tissue in the crease originally put in as a nonpressure wound  Present on Admission: Yes    Physical Exam: Vital Signs Blood pressure (!) 131/92, pulse 91, temperature 98.6 F (37 C), resp. rate 19, height 5\' 10"  (1.778 m), weight (!) 161.9 kg, SpO2 96 %.   Constitutional: No distress . Vital signs reviewed.obese  HEENT: EOMI, oral membranes moist Neck: supple Cardiovascular: RRR without murmur. No JVD    Respiratory/Chest: CTA Bilaterally without wheezes or rales. Normal effort. O2 [place] GI/Abdomen: BS +, non-tender, non-distended Ext: no clubbing, cyanosis, or edema Psych:  pleasant and cooperative Skin: persistent areas of eschar on bilateral great toes, both dry. Small 1.5-2cm stage ?II+ on sacrum with central fibronecrotic debris--stable Neuro: Pt is cognitively appropriate with normal insight, memory, and awareness. Cranial nerves 2-12 are intact. DTR's 1+.  Fine motor coordination is intact. No tremors. Motor function is grossly 5/5 RUE. LUE weak in ABD/EE,WE, decreased sensation along lateral wrist and arm Left upper ext remains hyperalgesic.  Musculoskeletal: Full ROM, No pain with AROM or PROM in the neck, trunk, or LE's.  Posture appropriate       Assessment/Plan: 1. 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  Physiatrist is providing close team supervision and 24 hour management of active medical problems listed below.  Physiatrist and rehab team continue to assess barriers to discharge/monitor patient progress toward functional and medical goals  Care Tool:  Bathing    Body parts bathed by patient: Chest, Abdomen, Front perineal area, Right upper leg, Left upper leg, Face   Body parts bathed by helper: Right arm, Left arm, Buttocks, Left lower leg, Right lower leg     Bathing assist Assist Level: 2 Helpers     Upper Body Dressing/Undressing Upper body dressing   What is the patient wearing?: Hospital gown only    Upper body assist Assist Level: Moderate Assistance - Patient 50 - 74%    Lower Body Dressing/Undressing  Lower body dressing      What is the patient wearing?: Incontinence brief     Lower body assist Assist for lower body dressing: 2 Helpers     Toileting Toileting    Toileting assist Assist for toileting: Minimal Assistance - Patient > 75% (Assist the pt with the urinal)     Transfers Chair/bed transfer  Transfers assist  Chair/bed transfer activity did not occur: Safety/medical  concerns  Chair/bed transfer assist level: Dependent - mechanical lift (Stedy, min-mod +2)     Locomotion Ambulation   Ambulation assist   Ambulation activity did not occur: Safety/medical concerns  Assist level: 2 helpers Assistive device: Parallel bars Max distance: 5 ft   Walk 10 feet activity   Assist  Walk 10 feet activity did not occur: Safety/medical concerns        Walk 50 feet activity   Assist Walk 50 feet with 2 turns activity did not occur: Safety/medical concerns         Walk 150 feet activity   Assist Walk 150 feet activity did not occur: Safety/medical concerns         Walk 10 feet on uneven surface  activity   Assist Walk 10 feet on uneven surfaces activity did not occur: Safety/medical concerns         Wheelchair     Assist Will patient use wheelchair at discharge?: No   Wheelchair activity did not occur: Safety/medical concerns         Wheelchair 50 feet with 2 turns activity    Assist    Wheelchair 50 feet with 2 turns activity did not occur: Safety/medical concerns       Wheelchair 150 feet activity     Assist  Wheelchair 150 feet activity did not occur: Safety/medical concerns       Blood pressure (!) 131/92, pulse 91, temperature 98.6 F (37 C), resp. rate 19, height 5\' 10"  (1.778 m), weight (!) 161.9 kg, SpO2 96 %.  Medical Problem List and Plan: 1.  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             -Continue CIR             Weaning supplemental oxygen as tolerated--making progress with this 2.  Antithrombotics: -DVT/anticoagulation: Lovenox 85 mg daily.               -antiplatelet therapy: Aspirin 81 mg daily 3. Pain Management: Advil as needed             Oxycodone as needed             11/2 increase  gabapentin to 300mg  tid to help with dysesthetic LUE pain 4. Mood: Provide emotional support             -antipsychotic agents: N/A 5. Neuropsych: This patient is capable of making decisions on his own behalf. 6. Skin/Wound Care:Santyl ointment applied full-thickness wound gluteal cleft/sacrum. May need hydrotherapy to this small spot  - routine skin checks.   7. Fluids/Electrolytes/Nutrition: Routine in and outs.  CMP ordered. 8.  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  Patient completed 5-day course Solu-Medrol  Supportive care with range of motion             Recommend NCV/EMG as outpatient.  -increased gabapentin as above 9.  New onset diabetes mellitus, hemoglobin A1c 7.2.  NovoLog 4 units 3 times daily, Levemir 15 units twice daily.  Provide diabetic teaching             CBG (last 3)  Recent Labs    06/11/20 2051 06/12/20 0629 06/12/20 1131  GLUCAP 126* 118* 117*   -improving control 11/2 10.  Hypertension with tachycardia.  Lopressor 50 mg twice daily.               10/31 pt hypotensive yesterday with OT which wasn't unexpected   -TEDS, Abd binder   -push po fluids   -continue to acclimate, get OOB   -reassured him that this is to be expected   -continue to monitor  11/1-2: BP now is slightly elevated at rest.    -may need to increase metoprolol if elevation is persistnet 11.  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  Dietary follow-up    LOS: 4 days A FACE TO FACE EVALUATION WAS PERFORMED  Ranelle Oyster 06/12/2020, 12:41 PM

## 2020-06-12 NOTE — Progress Notes (Signed)
Occupational Therapy Session Note  Patient Details  Name: Collin Brown MRN: [id number] Date of Birth: [birth year]  Today's Date: 06/12/2020 OT Individual Time: 1030-1110 OT Individual Time Calculation (min): 40 min   Session 2: OT Individual Time:  1355-1500 OT Individual Time Calculation (min): 65 min    Short Term Goals: Week 1:  OT Short Term Goal 1 (Week 1): Pt will complete sit<>stand with mod assist +2 in preperation for LB ADLs OT Short Term Goal 2 (Week 1): Pt will achieve full PROM elbow flexion with minimal to no pain. OT Short Term Goal 3 (Week 1): Pt will complete UB dressing with mod assist. OT Short Term Goal 4 (Week 1): Pt will complete UB bathing using long handled sponge as needed with min assist.  Skilled Therapeutic Interventions/Progress Updates:    Pt received supine, reporting fatigue from earlier session but in good spirits. Pt on RA upon entering room and pt SpO2 at 88%. 2L [place] administered and pt rebounded to 95%. Pt completed bed mobility with mod A to EOB. Pt used stedy to complete sit > stand with mod A +2. Pt was transferred to TIS w/c with stedy. Pt completed oral care at the sink with set up assist and min facilitation to incorporate LUE into task as gross assist. Pt was successfully able to maintain gross grasp and stabilize toothbrush! Pt completed hair care and face washing with set up assist as well. Very thorough hand hygiene and exfoliation provided to increase hygiene, as well as promote bimanual integration and sensory return to the LUE. Pt reported reduced pain from yesterday's session in the LUE overall. Pt completed a stand pivot transfer back to bed with mod A. Pt was left supine with all needs met, bed alarm set.   Session 2:   Pt received supine with 2/10 pain in his L arm. Pt agreeable to ADL EOB. Pt completed bed mobility with min A to transition supine to EOB. Pt completed UB bathing with min A good carryover of hemi strategies. Mod A overall  to don shirt. Pt attempted to complete sit > stand from EOB but upon trying to stand he bumped his L arm on the RW and c/o very high pain resulting. Pt requested to use stedy instead of sit > stand. Pt c/o feeling dizzy and his BP EOB was 145/101. Pt completed sit > stand in the stedy with mod +2 assist. He completed anterior peri hygiene with min A. Pt requested to return to supine and required min A to do so. Upon returning to supine BP 133/85. Pt completed hip bridge to doff pants supine. Max A required to pull down and off feet. Pants threaded over legs and max A required to pull up thoroughly, with pt rolling R and L with min A. RN entered to administer medication for nerve pain. His oxygen was titrated to 1L and pt completed 3x10 hip bridges with drop to 93% but rebound with rest break. Pt also completed functional reach with 5lb dumbbell 3x 10 repetitions. Provided education to pt and his mother now present on recovery, d/c planning, and LUE NMR. Pt was left supine with all needs met, bed alarm set.    Therapy Documentation Precautions:  Precautions Precautions: Fall Precaution Comments: flaccid LUE (protect from subluxation); sling when OOB for comfort; skin integrity (sacral wound); monitor vitals Required Braces or Orthoses: Sling Restrictions Weight Bearing Restrictions: No   Therapy/Group: Individual Therapy  Curtis Sites 06/12/2020, 6:38 AM

## 2020-06-12 NOTE — Progress Notes (Signed)
Patient ID: Collin Brown, male   DOB: April 02, 1986, 34 y.o.   MRN: [id number]  SW met with pt in room to provide updates from team conference, and d/c date 11/19. Pt aware SW to follow-up with his mother.   SW spoke with pt mother Collin Brown [phone]) to provide updates on above. SW discussed family education and informed there will be follow-up about scheduling closer towards discharge.   Loralee Pacas, MSW, [place] Office: [phone] Cell: [phone] Fax: [phone]

## 2020-06-12 NOTE — Plan of Care (Signed)
  Problem: Consults Goal: RH GENERAL PATIENT EDUCATION Description: See Patient Education module for education specifics. Outcome: Progressing Goal: Skin Care Protocol Initiated - if Braden Score 18 or less Description: If consults are not indicated, leave blank or document N/A Outcome: Progressing Goal: Nutrition Consult-if indicated Outcome: Progressing Goal: Diabetes Guidelines if Diabetic/Glucose > 140 Description: If diabetic or lab glucose is > 140 mg/dl - Initiate Diabetes/Hyperglycemia Guidelines & Document Interventions  Outcome: Progressing   Problem: RH BOWEL ELIMINATION Goal: RH STG MANAGE BOWEL WITH ASSISTANCE Description: STG Manage Bowel with  mod Assistance. Outcome: Progressing Goal: RH STG MANAGE BOWEL W/MEDICATION W/ASSISTANCE Description: STG Manage Bowel with Medication with Assistance. Outcome: Progressing   Problem: RH SKIN INTEGRITY Goal: RH STG SKIN FREE OF INFECTION/BREAKDOWN Outcome: Progressing Goal: RH STG MAINTAIN SKIN INTEGRITY WITH ASSISTANCE Description: STG Maintain Skin Integrity With  mod Assistance. Outcome: Progressing Goal: RH STG ABLE TO PERFORM INCISION/WOUND CARE W/ASSISTANCE Description: STG Able To Perform Incision/Wound Care With mod Assistance. Outcome: Progressing   Problem: RH SAFETY Goal: RH STG ADHERE TO SAFETY PRECAUTIONS W/ASSISTANCE/DEVICE Description: STG Adhere to Safety Precautions With  mod Assistance/Device. Outcome: Progressing   Problem: RH PAIN MANAGEMENT Goal: RH STG PAIN MANAGED AT OR BELOW PT'S PAIN GOAL Description: Pain free and <3  Outcome: Progressing

## 2020-06-13 ENCOUNTER — Encounter (HOSPITAL_COMMUNITY): Payer: BC Managed Care – PPO | Admitting: Psychology

## 2020-06-13 ENCOUNTER — Inpatient Hospital Stay (HOSPITAL_COMMUNITY): Payer: BC Managed Care – PPO

## 2020-06-13 DIAGNOSIS — R5381 Other malaise: Secondary | ICD-10-CM | POA: Diagnosis not present

## 2020-06-13 DIAGNOSIS — G7281 Critical illness myopathy: Secondary | ICD-10-CM | POA: Diagnosis not present

## 2020-06-13 LAB — GLUCOSE, CAPILLARY
Glucose-Capillary: 108 mg/dL — ABNORMAL HIGH (ref 70–99)
Glucose-Capillary: 113 mg/dL — ABNORMAL HIGH (ref 70–99)
Glucose-Capillary: 93 mg/dL (ref 70–99)
Glucose-Capillary: 128 mg/dL — ABNORMAL HIGH (ref 70–99)

## 2020-06-13 MED ORDER — GABAPENTIN 400 MG PO CAPS
400.0000 mg | ORAL_CAPSULE | Freq: Three times a day (TID) | ORAL | Status: DC
  Administered 2020-06-13 – 2020-07-13 (×90): 400 mg via ORAL
  Filled 2020-06-13 (×90): qty 1

## 2020-06-13 MED ORDER — METOPROLOL TARTRATE 50 MG PO TABS
75.0000 mg | ORAL_TABLET | Freq: Two times a day (BID) | ORAL | Status: DC
  Administered 2020-06-13 – 2020-07-13 (×54): 75 mg via ORAL
  Filled 2020-06-13 (×60): qty 1

## 2020-06-13 MED ORDER — METOPROLOL TARTRATE 25 MG PO TABS
25.0000 mg | ORAL_TABLET | Freq: Once | ORAL | Status: AC
  Administered 2020-06-13: 25 mg via ORAL
  Filled 2020-06-13: qty 1

## 2020-06-13 NOTE — Plan of Care (Signed)
  Problem: RH BOWEL ELIMINATION Goal: RH STG MANAGE BOWEL WITH ASSISTANCE Description: STG Manage Bowel with  mod Assistance. Outcome: Progressing Goal: RH STG MANAGE BOWEL W/MEDICATION W/ASSISTANCE Description: STG Manage Bowel with Medication with Assistance. Outcome: Progressing   Problem: RH SKIN INTEGRITY Goal: RH STG SKIN FREE OF INFECTION/BREAKDOWN Outcome: Progressing Goal: RH STG MAINTAIN SKIN INTEGRITY WITH ASSISTANCE Description: STG Maintain Skin Integrity With  mod Assistance. Outcome: Progressing Goal: RH STG ABLE TO PERFORM INCISION/WOUND CARE W/ASSISTANCE Description: STG Able To Perform Incision/Wound Care With mod Assistance. Outcome: Progressing

## 2020-06-13 NOTE — Consult Note (Signed)
Neuropsychological Consultation   Patient:   Collin Brown   DOB:   1986-07-30  MR Number:  [id number]  Location:  [place] [facility] [place] [id number] [place] [address] Dept: [place]: [phone]           Date of Service:   07/10/2020  Start Time:   2 PM End Time:   3 PM  Provider/Observer:  Ilean Skill, Psy.D.       Clinical Neuropsychologist       Billing Code/Service: 58527  Chief Complaint:    Collin Brown is a 34 year old right-handed male with obstructive sleep apnea, morbid obesity.  Patient presented on 05/03/2020 with increasing shortness of breath, cough and low-grade fever.  Patient found to be COVID-19 positive.  He was treated with IV remdesivir and IV steroids.  His condition did initially deteriorated and required intubation and ultimately self extubated on 05/20/2020 and transition to BiPAP.  Patient had bouts of dropping O2 sats but was able to be maintained on BiPAP.  Hospital course further complicated by acute metabolic encephalopathy.  CT/MRI unremarkable for acute intracranial process.  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  CT of chest completed showing no abnormality at the level of left brachial plexus.  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  Patient was placed on Lovenox for DVT prophylaxis.  Patient was admitted to the comprehensive rehabilitation program following therapy assessments.  Reason for Service:  Patient was referred for neuropsychological consultation due to coping with residual effects of his COVID-19 condition and slowly recovering medical status.  The patient has questions about return to work issues as he is worked as a Freight forwarder at [employer] and is very interested in retaining his job.  Below see HPI for the current admission.  HPI:  Collin Brown is a 34 year old right-handed male with obstructive sleep apnea, morbid obesity BMI 51.88.  History taken from chart review, mother, and patient.  Patient lives with his mother and 73 year old nephew.  Reportedly independent prior to admission working as a Freight forwarder at [employer].  He presented on 05/03/2020 with increasing shortness of breath, cough, and low-grade fever.  Reportedly patient had recently traveled to [place] to visit his brother over the summer. .  Patient found to be COVID-19 positive.  He was treated with IV remdesivir and IV steroids.  His condition  did initially deteriorate and required intubation and ultimately self extubated on 05/20/2020 and transition to BiPAP.  Hospital course further complicated by acute metabolic encephalopathy.  CT/MRI unremarkable for acute intracranial process.  Patient did have some complaints of left shoulder pain with weakness and evaluated for possible brachial plexus mass. CT of the chest completed showing no abnormality at the level of left brachial plexus.  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  He was placed on Lovenox for DVT prophylaxis.  Findings of elevated hemoglobin A1c 7.2 and placed on insulin therapy. WOC follow-up for pressure injury gluteal cleft with wound care as directed.  He is tolerating a mechanical soft thin liquid diet.  Therapy evaluations completed and patient was admitted for a comprehensive rehab program.  Please see preadmission assessment from earlier today as well.  Current Status:  Patient was alert and oriented upon entering the room slightly elevated in his  bed.  Patient receiving oxygen supplementation.  Patient reports that he feels like he is returning to baseline as far as cognitive functioning for the most part.  He reports that his memory has improved and he has well oriented.  Some ongoing slowing of overall  cognitive functioning.  Patient reports that he has been making steady progress and is gone from not being able to sit up in his bed for more than just a very short period of time to actually being able to sit up for extended period of time able to stand and has been walking approximately 50 feet recently.  Expected discharge is on the 19th and the patient appears to be making good progress towards that discharge.  At this point, specific recommendations for when he will be ready to return to work or not possible but the expectation to return to work status in the future is clearly something that are the goals of his recovery.  Patient reports that this has been stressful for him overall but other than some of the significant vivid dreams that he experienced during his hospitalization and the distress that is called initially when it was difficult for him to differentiate between what was real and what was not his mood has been generally good.  Behavioral Observation: Collin Brown  presents as a 34 y.o.-year-old Right Caucasian Male who appeared his stated age. his dress was Appropriate and he was Well Groomed and his manners were Appropriate to the situation.  his participation was indicative of Appropriate and Redirectable behaviors.  There were any physical disabilities noted.  he displayed an appropriate level of cooperation and motivation.     Interactions:    Active Appropriate and Redirectable  Attention:   abnormal and attention span appeared shorter than expected for age  Memory:   within normal limits; recent and remote memory intact with the exception of his time during intubation.  Visuo-spatial:  not examined  Speech (Volume):  low  Speech:   normal; some slowed response times and increased time for processing information noted.  Thought Process:  Coherent and Relevant  Though Content:  WNL; not suicidal and not homicidal  Orientation:   person, place, time/date and  situation  Judgment:   Good  Planning:   Good  Affect:    Lethargic  Mood:    Dysphoric  Insight:   Good  Intelligence:   normal  Medical History:   Past Medical History:  Diagnosis Date  . Critical illness myopathy   . Diabetes mellitus without complication ([place])   . Hearing loss in right ear   . Hypertension   . Sleep apnea    Psychiatric History:  No prior psychiatric history.  Family Med/Psych History:  Family History  Problem Relation Age of Onset  . Diabetes Mellitus II Mother   . Hypertension Mother   . Diabetes Mellitus II Father   . High blood pressure Father    Impression/DX:  Collin Brown is a 34 year old right-handed male with obstructive sleep apnea, morbid obesity.  Patient presented on 05/03/2020 with increasing shortness of breath, cough and low-grade fever.  Patient found to be COVID-19 positive.  He was treated with IV remdesivir and IV steroids.  His condition did initially deteriorated and required intubation and ultimately self extubated on 05/20/2020 and transition to BiPAP.  Patient had bouts of dropping O2 sats but was able to be maintained on BiPAP.  Hospital course further complicated by acute metabolic encephalopathy.  CT/MRI unremarkable  for acute intracranial process.  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  CT of chest completed showing no abnormality at the level of left brachial plexus.  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  Patient was placed on Lovenox for DVT prophylaxis.  Patient was admitted to the comprehensive rehabilitation program following therapy assessments.  Patient was alert and oriented upon entering the room slightly elevated in his bed.  Patient receiving oxygen supplementation.  Patient reports that he feels like he is returning to baseline as far as cognitive functioning for the most part.  He reports that his  memory has improved and he has well oriented.  Some ongoing slowing of overall cognitive functioning.  Patient reports that he has been making steady progress and is gone from not being able to sit up in his bed for more than just a very short period of time to actually being able to sit up for extended period of time able to stand and has been walking approximately 50 feet recently.  Expected discharge is on the 19th and the patient appears to be making good progress towards that discharge.  At this point, specific recommendations for when he will be ready to return to work or not possible but the expectation to return to work status in the future is clearly something that are the goals of his recovery.  Patient reports that this has been stressful for him overall but other than some of the significant vivid dreams that he experienced during his hospitalization and the distress that is called initially when it was difficult for him to differentiate between what was real and what was not his mood has been generally good.   Disposition/Plan:  Worked on coping and adjustment issues around critical illness myopathy and debility.  Worked on developing plan for post discharge and what goals need to be met for return to work status.  I will follow up with the patient next week.  Diagnosis:    Debility post severe Covid 19 status.        Electronically Signed   _______________________ Ilean Skill, Psy.D.

## 2020-06-13 NOTE — Progress Notes (Signed)
Pt wants to go on bipap at 2200 tonight

## 2020-06-13 NOTE — Progress Notes (Signed)
Occupational Therapy Session Note  Patient Details  Name: Collin Brown MRN: [id number] Date of Birth: 1986-05-04  Today's Date: 06/13/2020 OT Individual Time: 4098-1191 OT Individual Time Calculation (min): 55 min    Short Term Goals: Week 1:  OT Short Term Goal 1 (Week 1): Pt will complete sit<>stand with mod assist +2 in preperation for LB ADLs OT Short Term Goal 2 (Week 1): Pt will achieve full PROM elbow flexion with minimal to no pain. OT Short Term Goal 3 (Week 1): Pt will complete UB dressing with mod assist. OT Short Term Goal 4 (Week 1): Pt will complete UB bathing using long handled sponge as needed with min assist.  Skilled Therapeutic Interventions/Progress Updates:    1;1. Pt received in bed with "mild" LUE pain that was premedicated by RN and repositioned at end of session for comfort. OT retrieves bariatric [facility] for better fit for placement at EOB after toileting yesterday required maximove. Pt completes supine>sitting wht MOD A to elevate trunk and scoot to EOB. Pt able to thread BLE into pants with dressing stick but requries A to pull up from knees past hips in standing with +2 A. Pt transfers with MOD A of 1 with mild L instability noted and envelope sling donned for comfort. Pt grooms at sink with VC for incorporating LUE into bimanual task at sink. Pt washes hair with A to rinse out shampoo at sink. Exited session with pt seated in TIS, exit alarm on and call light in reach and LUE elevated on pillows   Therapy Documentation Precautions:  Precautions Precautions: Fall Precaution Comments: flaccid LUE (protect from subluxation); sling when OOB for comfort; skin integrity (sacral wound); monitor vitals Required Braces or Orthoses: Sling Restrictions Weight Bearing Restrictions: No General:   Vital Signs:   Pain:   ADL: ADL Upper Body Bathing: Maximal assistance Where Assessed-Upper Body Bathing: Edge of bed Lower Body Bathing: Other (comment) (+2  assist) Where Assessed-Lower Body Bathing: Edge of bed, Bed level (attempted to wash buttocks but quickly fatigued; therefore completed at bed level sidelying) Upper Body Dressing: Maximal assistance Where Assessed-Upper Body Dressing: Edge of bed Lower Body Dressing: Other (Comment) (attempted in standing but fatigued; bed level +2 total assist) Where Assessed-Lower Body Dressing: Edge of bed, Bed level Toileting: Dependent Where Assessed-Toileting: Bed level (urinal and incontinent of bowel) Vision   Perception    Praxis   Exercises:   Other Treatments:     Therapy/Group: Individual Therapy  Shon Hale 06/13/2020, 8:33 AM

## 2020-06-13 NOTE — Progress Notes (Signed)
[place] PHYSICAL MEDICINE & REHABILITATION PROGRESS NOTE   Subjective/Complaints: Left arm still painful. Gabapentin not sedating him and does help, he is agreeable to increasing dose.  Walked 26 feet yesterday Having daily BM   ROS: Patient denies fever, rash, sore throat, blurred vision, nausea, vomiting, diarrhea, cough,  chest pain, joint or back pain, headache, or mood change.    Objective:   No results found. Recent Labs    06/11/20 0841  WBC 13.8*  HGB 12.6*  HCT 40.5  PLT 183   Recent Labs    06/11/20 0841  NA 134*  K 4.2  CL 97*  CO2 27  GLUCOSE 183*  BUN 17  CREATININE 0.68  CALCIUM 9.1    Intake/Output Summary (Last 24 hours) at 06/13/2020 0848 Last data filed at 06/13/2020 0700 Gross per 24 hour  Intake 1260 ml  Output 2550 ml  Net -1290 ml     Pressure Injury 06/08/20 Coccyx Unstageable - Full thickness tissue loss in which the base of the injury is covered by slough (yellow, tan, gray, green or brown) and/or eschar (tan, brown or black) in the wound bed. yellow necrotic tissue in the crease o (Active)  06/08/20 1830 (with Lizabeth Leyden RN)  Location: Coccyx  Location Orientation:   Staging: Unstageable - Full thickness tissue loss in which the base of the injury is covered by slough (yellow, tan, gray, green or brown) and/or eschar (tan, brown or black) in the wound bed.  Wound Description (Comments): yellow necrotic tissue in the crease originally put in as a nonpressure wound  Present on Admission: Yes    Physical Exam: Vital Signs Blood pressure (!) 139/92, pulse 93, temperature 98.5 F (36.9 C), resp. rate 14, height 5\' 10"  (1.778 m), weight (!) 161.9 kg, SpO2 95 %. General: Alert and oriented x 3, No apparent distress HEENT: Head is normocephalic, atraumatic, PERRLA, EOMI, sclera anicteric, oral mucosa pink and moist, dentition intact, ext ear canals clear,  Neck: Supple without JVD or lymphadenopathy Heart: Reg rate and rhythm. No  murmurs rubs or gallops Chest: CTA bilaterally without wheezes, rales, or rhonchi; no distress Abdomen: Soft, non-tender, non-distended, bowel sounds positive. Extremities: No clubbing, cyanosis, or edema. Pulses are 2+ Psych: pleasant and cooperative Skin: persistent areas of eschar on bilateral great toes, both dry. Small 1.5-2cm stage ?II+ on sacrum with central fibronecrotic debris--stable Neuro: Pt is cognitively appropriate with normal insight, memory, and awareness. Cranial nerves 2-12 are intact. DTR's 1+.  Fine motor coordination is intact. No tremors. Motor function is grossly 5/5 RUE. LUE weak in ABD/EE,WE, decreased sensation along lateral wrist and arm Left upper ext remains hyperalgesic.  Musculoskeletal: Full ROM, No pain with AROM or PROM in the neck, trunk, or LE's.  Posture appropriate       Assessment/Plan: 1. 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  Physiatrist is providing close team supervision and 24 hour management of active medical problems listed below.  Physiatrist and rehab team continue to assess barriers to discharge/monitor patient progress toward functional and medical goals  Care Tool:  Bathing    Body parts bathed by patient: Chest, Abdomen, Front perineal area, Right upper leg, Left upper leg, Face   Body parts bathed by helper: Right arm, Left arm, Buttocks, Left lower leg, Right lower leg     Bathing assist Assist Level: 2 Helpers     Upper Body Dressing/Undressing Upper  body dressing   What is the patient wearing?: Hospital gown only    Upper body assist Assist Level: Moderate Assistance - Patient 50 - 74%    Lower Body Dressing/Undressing Lower body dressing      What is the patient wearing?: Incontinence brief     Lower body assist Assist for lower body dressing: 2 Helpers     Toileting Toileting     Toileting assist Assist for toileting: Minimal Assistance - Patient > 75% (Assist the pt with the urinal)     Transfers Chair/bed transfer  Transfers assist  Chair/bed transfer activity did not occur: Safety/medical concerns  Chair/bed transfer assist level: Dependent - mechanical lift (Stedy, min-mod +2)     Locomotion Ambulation   Ambulation assist   Ambulation activity did not occur: Safety/medical concerns  Assist level: 2 helpers Assistive device: Parallel bars Max distance: 5 ft   Walk 10 feet activity   Assist  Walk 10 feet activity did not occur: Safety/medical concerns        Walk 50 feet activity   Assist Walk 50 feet with 2 turns activity did not occur: Safety/medical concerns         Walk 150 feet activity   Assist Walk 150 feet activity did not occur: Safety/medical concerns         Walk 10 feet on uneven surface  activity   Assist Walk 10 feet on uneven surfaces activity did not occur: Safety/medical concerns         Wheelchair     Assist Will patient use wheelchair at discharge?: No   Wheelchair activity did not occur: Safety/medical concerns         Wheelchair 50 feet with 2 turns activity    Assist    Wheelchair 50 feet with 2 turns activity did not occur: Safety/medical concerns       Wheelchair 150 feet activity     Assist  Wheelchair 150 feet activity did not occur: Safety/medical concerns       Blood pressure (!) 139/92, pulse 93, temperature 98.5 F (36.9 C), resp. rate 14, height 5\' 10"  (1.778 m), weight (!) 161.9 kg, SpO2 95 %.  Medical Problem List and Plan: 1.  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             -Continue CIR             Weaning supplemental oxygen as tolerated--making progress  with this 2.  Antithrombotics: -DVT/anticoagulation: Lovenox 85 mg daily.               -antiplatelet therapy: Aspirin 81 mg daily 3. Pain Management: Advil as needed             Oxycodone as needed             11/3: increase gabapentin to 400mg  TID 4. Mood: Provide emotional support             -antipsychotic agents: N/A 5. Neuropsych: This patient is capable of making decisions on his own behalf. 6. Skin/Wound Care:Santyl ointment applied full-thickness wound gluteal cleft/sacrum. May need hydrotherapy to this small spot  - routine skin checks.   7. Fluids/Electrolytes/Nutrition: Routine in and outs.  CMP  ordered. 8.  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  Patient completed 5-day course Solu-Medrol             Supportive care with range of motion             Recommend NCV/EMG as outpatient.  -increased gabapentin as above 9.  New onset diabetes mellitus, hemoglobin A1c 7.2.  NovoLog 4 units 3 times daily, Levemir 15 units twice daily.  Provide diabetic teaching             CBG (last 3)  Recent Labs    06/12/20 1628 06/12/20 2102 06/13/20 0604  GLUCAP 119* 109* 108*   -11/3: excellent control 10.  Hypertension with tachycardia.  Lopressor 50 mg twice daily.               10/31 pt hypotensive yesterday with OT which wasn't unexpected   -TEDS, Abd binder   -push po fluids   -continue to acclimate, get OOB   -reassured him that this is to be expected   -continue to monitor  11/3: Increase Lopressor to 75mg  BID 11.  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  Dietary follow-up    LOS: 5 days A FACE TO FACE EVALUATION WAS PERFORMED  Joanna Hall 06/13/2020, 8:48 AM

## 2020-06-13 NOTE — Progress Notes (Signed)
Physical Therapy Session Note  Patient Details  Name: Collin Brown MRN: [id number] Date of Birth: November 01, 1985  Today's Date: 06/13/2020 PT Individual Time: 1005-1108 and 1330-1445 PT Individual Time Calculation (min): 63 min   Short Term Goals: Week 1:  PT Short Term Goal 1 (Week 1): Pt will perform bed mobility with maxA +1 person PT Short Term Goal 2 (Week 1): Pt will perform bed<>chair transfers with maxA and LRAD PT Short Term Goal 3 (Week 1): Pt will tolerate sitting in chair/recliner for >1 hour with stable vital signs PT Short Term Goal 4 (Week 1): Pt will begin initiating pre-gait training with maxA and LRAD  Skilled Therapeutic Interventions/Progress Updates:     Session 1: Patient in TIS w/c upon PT arrival. Patient alert and agreeable to PT session. Patient reported 7/10 L arm pain and 4/10 sacral pain during session, RN made aware. PT provided repositioning, rest breaks, and distraction as pain interventions throughout session. Reported that he tolerated sitting in the TIS w/c x1 hour prior to session and that his sacral pain started <10 min ago. Educated on calling for assist for pressure relief using tilt feature of chair, patient stated understanding.   Therapeutic Activity: Bed Mobility: Patient performed sit to supine with supervision with increased time and use of bed rail. Provided verbal cues for management of his L arm during mobility. Patient doffed B tennis shoes sitting EOB independently. Transfers: Patient performed stand pivot bed<>w/c with mod A of 1 person and second person providing SBA for safety and equipment management. He performed sit to/from stand x3 with mod A in the // bars. Provided verbal cues for forward weight shift and trunk flexion to stand, controlled descent, and sequencing for pivoting to/from the w/c. Patient stood 20 sec, 60 sec, and 74 sec in the // bars with R hand support. Changed TIS w/c cushion to Roho with patient in standing, patient  reported reduced sacral pressure with Roho cushion. Limited by LE fatigue during first trials and nausea with orthostasis on the second and third trial.  Vitals:  After second stand: BP 111/85, HR 87 (mild symptoms) Sitting after recovery: BP 138/91, HR 97 (asymptomatic) Standing during third trial: BP 116/89, HR 103 (very symptomatic) Placed patient in full tilt in TIS w/c for recovery and provided a cool washcloth for nausea. Symptoms improved, but did not resolve. Returned patient to the bed, as above, symptoms continuing to improve. Patient missed 13 min of skilled PT due to fatigue/nausea, RN made aware. Will attempt to make-up missed time as able.    Wheelchair Mobility:  Patient was transported in the TIS w/c with total A throughout session for energy conservation and time management.  Patient in bed at end of session with breaks locked, bed alarm set, and all needs within reach.  Patient on 2L/min O2 throughout session, SPO2 >95% throughout.     Session 2: Patient in bed upon PT arrival. Patient alert and agreeable to PT session. Patient reported 2-4/10 L arm pain during session, RN made aware. PT provided repositioning, rest breaks, and distraction as pain interventions throughout session.   Therapeutic Activity: Bed Mobility: B TED hose donned prior to session. Performed rolling R/L with min A-CGA with use of bed rails to don abdominal binder prior to OOB mobility. Patient performed supine to sit with min-mod A for trunk support and sit to supine with supervision with increased time and use of bed rail. Provided verbal cues for rolling to his R to set his  elbow to push up and management of his L arm during mobility. Patient sat EOB with supervision and PT donned B tennis shoes with total A. Doffed B thigh high TEDs and pants with total A at bed level at end of session. Cued patient to perform bridging x2 to doff pants. Transfers: Patient performed stand pivot bed<>TIS w/c with mod A of 2  for boosting up due to increased lower extremity fatigue this afternoon. He performed sit to/from stand x2 with mod A +2 using a R rail. Provided verbal cues as above with increased facilitation for forward weight shift this afternoon.  Gait Training:  Patient ambulated 28 feet and 15 feet using R rail with min-mod A. Ambulated with decreased gait speed, decreased step length, increased knee hyperextension in stance R>L, and mild forward trunk lean. Provided verbal cues for erect posture and paced breathing for improved breath support and increased quad and hamstring activation in stance to reduce hyperextension.  Vitals: Sitting BP 148/89, HR 91, standing BP 132/85, HR 116, after ambulation BP 135/106, HR 109 (mild nausea, resolved in sitting)  Neuromuscular Re-ed: Patient performed the following seated L upper extremity motor control activities: -gross grasp 2x10 focused on finger extension following full flexion -gravity eliminated elbow flexion/extension progressing from PROM to max-mod assist AAROM 2x10 -B shoulder retraction/protraction 2x10 with 5 sec hold in retraction -Patient sustained holding his L hand on a table x20 sec before fatigue -lifted his hand from the table and back onto the table with min A and facilitation for initiation, demonstrates significant compensation with R trunk lean and shoulder elevation to lift his hand on/off the table, provided cues for reduces compensatory strategies throughout.  Patient in bed at end of session with breaks locked, bed alarm set, and all needs within reach.  Patient on 2L/min O2 throughout session, SPO2 >95% throughout.     Therapy Documentation Precautions:  Precautions Precautions: Fall Precaution Comments: flaccid LUE (protect from subluxation); sling when OOB for comfort; skin integrity (sacral wound); monitor vitals Required Braces or Orthoses: Sling Restrictions Weight Bearing Restrictions: No General: PT Amount of Missed Time  (min): 12 Minutes PT Missed Treatment Reason: Patient fatigue;Other (Comment) (nausea)   Therapy/Group: Individual Therapy  Tylar Merendino L Natacha Jepsen PT, DPT  06/13/2020, 4:36 PM

## 2020-06-14 ENCOUNTER — Inpatient Hospital Stay (HOSPITAL_COMMUNITY): Payer: BC Managed Care – PPO | Admitting: *Deleted

## 2020-06-14 ENCOUNTER — Inpatient Hospital Stay (HOSPITAL_COMMUNITY): Payer: BC Managed Care – PPO

## 2020-06-14 DIAGNOSIS — S143XXS Injury of brachial plexus, sequela: Secondary | ICD-10-CM | POA: Diagnosis not present

## 2020-06-14 DIAGNOSIS — G7281 Critical illness myopathy: Secondary | ICD-10-CM | POA: Diagnosis not present

## 2020-06-14 DIAGNOSIS — I10 Essential (primary) hypertension: Secondary | ICD-10-CM | POA: Diagnosis not present

## 2020-06-14 DIAGNOSIS — E1169 Type 2 diabetes mellitus with other specified complication: Secondary | ICD-10-CM | POA: Diagnosis not present

## 2020-06-14 LAB — GLUCOSE, CAPILLARY
Glucose-Capillary: 106 mg/dL — ABNORMAL HIGH (ref 70–99)
Glucose-Capillary: 99 mg/dL (ref 70–99)
Glucose-Capillary: 128 mg/dL — ABNORMAL HIGH (ref 70–99)
Glucose-Capillary: 124 mg/dL — ABNORMAL HIGH (ref 70–99)

## 2020-06-14 NOTE — Evaluation (Signed)
Recreational Therapy Assessment and Plan  Patient Details  Name: Collin Brown MRN: [id number] Date of Birth: 05-May-1986 Today's Date: 06/14/2020  Rehab Potential:  Good ELOS:   11/19  Assessment Hospital Problem: Principal Problem:   Critical illness myopathy Active Problems:   Debility   Past Medical History:      Past Medical History:  Diagnosis Date  . Critical illness myopathy   . Diabetes mellitus without complication ([place])   . Hearing loss in right ear   . Hypertension   . Sleep apnea    Past Surgical History:       Past Surgical History:  Procedure Laterality Date  . TONSILLECTOMY      Assessment & Plan Clinical Impression: Patient is a 34 year old right-handed male with obstructive sleep apnea, morbid obesity BMI 51.88. History taken from chart review, mother, and patient. Patient lives with his mother and 61 year old nephew. Reportedly independent prior to admission working as a Freight forwarder at [employer]. He presented on 05/03/2020 with increasing shortness of breath, cough, and low-grade fever.Reportedly patient had recently traveled to [place] to visit his brother over the summer. . Patient found to be COVID-19 positive. He was treated with IV remdesivirand IV steroids. His condition did initially deteriorate and required intubation and ultimately self extubated on 05/20/2020 and transition toBiPAP. Hospital course further complicated by acute metabolic encephalopathy.CT/MRIunremarkable for acute intracranial process.Patient did have some complaints of left shoulder pain with weakness and evaluated for possible brachial plexus mass.CT of the chest completed showing no abnormality at the level of left brachial plexus. 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 He was placed on Lovenox for DVT prophylaxis. Findings of elevated hemoglobin A1c 7.2 and  placed on insulin therapy. WOCfollow-up for pressure injury gluteal cleft with wound care as directed. He is tolerating a mechanical soft thin liquid diet. Therapy evaluations completed and patient was admitted for a comprehensive rehab program. Patient transferred to CIR on 06/08/2020 .   Met with pt today to discuss TR services, activity analysis/modificiatons and coping strategies.  Pt presents with decreased activity tolerance, decreased functional mobility, decreased balance, decreased oxygen support Limiting pt's independence with leisure/community pursuits.  Plan  Min 1 tr session 20 minutes per week during LOS  Recommendations for other services: None   Discharge Criteria: Patient will be discharged from TR if patient refuses treatment 3 consecutive times without medical reason.  If treatment goals not met, if there is a change in medical status, if patient makes no progress towards goals or if patient is discharged from hospital.  The above assessment, treatment plan, treatment alternatives and goals were discussed and mutually agreed upon: by patient  [place] 06/14/2020, 3:58 PM

## 2020-06-14 NOTE — Progress Notes (Signed)
[facility] PHYSICAL MEDICINE & REHABILITATION PROGRESS NOTE   Subjective/Complaints: Had a reasonable night. Increase in gabapentin helpful. LUE most tender along inner part of arm/elbow. Up with PT early this am. No new complaints  ROS: Patient denies fever, rash, sore throat, blurred vision, nausea, vomiting, diarrhea, cough, shortness of breath or chest pain,  headache, or mood change.     Objective:   No results found. No results for input(s): WBC, HGB, HCT, PLT in the last 72 hours. No results for input(s): NA, K, CL, CO2, GLUCOSE, BUN, CREATININE, CALCIUM in the last 72 hours.  Intake/Output Summary (Last 24 hours) at 06/14/2020 1016 Last data filed at 06/14/2020 0900 Gross per 24 hour  Intake 780 ml  Output 2101 ml  Net -1321 ml     Pressure Injury 06/08/20 Coccyx Unstageable - Full thickness tissue loss in which the base of the injury is covered by slough (yellow, tan, gray, green or brown) and/or eschar (tan, brown or black) in the wound bed. yellow necrotic tissue in the crease o (Active)  06/08/20 1830 (with Lizabeth Leyden RN)  Location: Coccyx  Location Orientation:   Staging: Unstageable - Full thickness tissue loss in which the base of the injury is covered by slough (yellow, tan, gray, green or brown) and/or eschar (tan, brown or black) in the wound bed.  Wound Description (Comments): yellow necrotic tissue in the crease originally put in as a nonpressure wound  Present on Admission: Yes    Physical Exam: Vital Signs Blood pressure 126/72, pulse 94, temperature (!) 97.1 F (36.2 C), resp. rate 18, height 5\' 10"  (1.778 m), weight (!) 159.2 kg, SpO2 93 %. Constitutional: No distress . Vital signs reviewed. HEENT: EOMI, oral membranes moist Neck: supple Cardiovascular: RRR without murmur. No JVD    Respiratory/Chest: CTA Bilaterally without wheezes or rales. Normal effort    GI/Abdomen: BS +, non-tender, non-distended Ext: no clubbing, cyanosis, or edema Psych:  pleasant and cooperative Skin: persistent areas of eschar on bilateral great toes, both dry. Small 1.5-2cm stage ?II+ on sacrum with central fibronecrotic debris--not visualized today. PICC still in LUE Neuro: Pt is cognitively appropriate with normal insight, memory, and awareness. Cranial nerves 2-12 are intact. DTR's 1+.  Fine motor coordination is intact. No tremors. Motor function is grossly 5/5 RUE. LUE weak in ABD/EE,WE, decreased sensation along lateral wrist and arm Left upper ext is hyperalgesic but seemed to tolerate palpation a bit more today  Musculoskeletal: Full ROM, No pain with AROM or PROM in the neck, trunk, or LE's.  Posture appropriate       Assessment/Plan: 1. 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  Physiatrist is providing close team supervision and 24 hour management of active medical problems listed below.  Physiatrist and rehab team continue to assess barriers to discharge/monitor patient progress toward functional and medical goals  Care Tool:  Bathing    Body parts bathed by patient: Chest, Abdomen, Front perineal area, Right upper leg, Left upper leg, Face   Body parts bathed by helper: Right arm, Left arm, Buttocks, Left lower leg, Right lower leg     Bathing assist Assist Level: 2 Helpers     Upper Body Dressing/Undressing Upper body dressing   What is the patient wearing?: Hospital gown only    Upper body assist Assist Level: Moderate Assistance - Patient 50 - 74%    Lower Body Dressing/Undressing Lower body  dressing      What is the patient wearing?: Incontinence brief     Lower body assist Assist for lower body dressing: 2 Helpers     Toileting Toileting    Toileting assist Assist for toileting: Minimal Assistance - Patient > 75% (Assist the pt with the urinal)     Transfers Chair/bed transfer  Transfers  assist  Chair/bed transfer activity did not occur: Safety/medical concerns  Chair/bed transfer assist level: 2 Helpers     Locomotion Ambulation   Ambulation assist   Ambulation activity did not occur: Safety/medical concerns  Assist level: Minimal Assistance - Patient > 75% Assistive device: Other (comment) (r rail) Max distance: 28 ft   Walk 10 feet activity   Assist  Walk 10 feet activity did not occur: Safety/medical concerns  Assist level: Minimal Assistance - Patient > 75% Assistive device: Other (comment) (R rail)   Walk 50 feet activity   Assist Walk 50 feet with 2 turns activity did not occur: Safety/medical concerns         Walk 150 feet activity   Assist Walk 150 feet activity did not occur: Safety/medical concerns         Walk 10 feet on uneven surface  activity   Assist Walk 10 feet on uneven surfaces activity did not occur: Safety/medical concerns         Wheelchair     Assist Will patient use wheelchair at discharge?: No   Wheelchair activity did not occur: Safety/medical concerns         Wheelchair 50 feet with 2 turns activity    Assist    Wheelchair 50 feet with 2 turns activity did not occur: Safety/medical concerns       Wheelchair 150 feet activity     Assist  Wheelchair 150 feet activity did not occur: Safety/medical concerns       Blood pressure 126/72, pulse 94, temperature (!) 97.1 F (36.2 C), resp. rate 18, height 5\' 10"  (1.778 m), weight (!) 159.2 kg, SpO2 93 %.  Medical Problem List and Plan: 1.  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             -Continue CIR             Weaning supplemental oxygen as tolerated--making progress with this 2.  Antithrombotics: -DVT/anticoagulation: Lovenox 85 mg  daily.               -antiplatelet therapy: Aspirin 81 mg daily 3. Pain Management: Advil as needed             Oxycodone as needed             11/4: increased gabapentin to 400mg  TID on 11/3--some improvement overall. Observe today 4. Mood: Provide emotional support             -antipsychotic agents: N/A 5. Neuropsych: This patient is capable of making decisions on his own behalf. 6. Skin/Wound Care:Santyl ointment applied full-thickness wound gluteal cleft/sacrum. May need hydrotherapy to this small spot  - routine skin checks.   7. Fluids/Electrolytes/Nutrition: Routine in and outs.  CMP ordered. 8.  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  Patient completed 5-day course Solu-Medrol  Supportive care with range of motion             Recommend NCV/EMG as outpatient.  -increased gabapentin as above 9.  New onset diabetes mellitus, hemoglobin A1c 7.2.  NovoLog 4 units 3 times daily, Levemir 15 units twice daily.  Provide diabetic teaching             CBG (last 3)  Recent Labs    06/13/20 1625 06/13/20 2053 06/14/20 0605  GLUCAP 93 128* 106*   -11/4: excellent control 10.  Hypertension with tachycardia.  Lopressor 50 mg twice daily.               10/31 pt hypotensive yesterday with OT which wasn't unexpected   -TEDS, Abd binder   -push po fluids   -continue to acclimate, get OOB   -reassured him that this is to be expected   -continue to monitor  11/3: Increased Lopressor to 75mg  BID  11/4 bp improved today 11.  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  Dietary follow-up    LOS: 6 days A FACE TO FACE EVALUATION WAS PERFORMED  06/14/2020, 10:16 AM

## 2020-06-14 NOTE — Progress Notes (Signed)
Physical Therapy Session Note  Patient Details  Name: Collin Brown MRN: [id number] Date of Birth: 01-26-1986  Today's Date: 06/14/2020 PT Individual Time: 2595-6387 PT Individual Time Calculation (min): 75 min   Short Term Goals: Week 1:  PT Short Term Goal 1 (Week 1): Pt will perform bed mobility with maxA +1 person PT Short Term Goal 2 (Week 1): Pt will perform bed<>chair transfers with maxA and LRAD PT Short Term Goal 3 (Week 1): Pt will tolerate sitting in chair/recliner for >1 hour with stable vital signs PT Short Term Goal 4 (Week 1): Pt will begin initiating pre-gait training with maxA and LRAD Week 2:    Week 3:     Skilled Therapeutic Interventions/Progress Updates:    PAIN C/o LUE pain w/pressure, care taken during session to support limb/avoid unnecessary contact.  Pain not quantified.  Pt wears sling during session for support.   Pt seen this am for 75 min session w/focus on endurance, global strength, transfers, gait,  Pt transported to hall/main gym for session. Instructed w/use of swedish walker. BP: 134/106 Pulse 85  Performs bilat LAQs x 20 as warm up activity.  Sit to stand w/min assist, gait x 75ft w/swedish walker, +2 min assist, second person assists w/02 tank, cues for abd LLE. Pt requires 7-8 min seated rest due to feeling fatigued/mild nausea  After gait BP 166/100 Hr 96 02 sats 97%  Gait repeated as above but w/increased tendency to step to midline w/LLE w/increased fatigue. 2nd gait ttrial BP 157/90 02 sats 97% HR 99 Rests 7-8 min, increased c/o nausea w/fatigue.  Pt transported to gym. Attempted STS at hi/lo table but pt unable to achieve upright w/max assist. Attempted STS in parallel bars w/+2 max assist but unable to achieve full stand/c/o quad discomfort. Rests several min between efforts due to poor endurance, profuse sweating w/exertion.  Pt transported to room wc tilted to increase Seat to floor ht and facilitate sit to  stand. Sit to stand w/max of 2, SPT to bed w/mod +2 and use of bed rail. Sit to supine w/cues to advance LEs, encouragement to perform without assist. Pt left supine w/rails up x 3, alarm set, bed in lowest position, and needs in reach.  Pt demonstrates some learned helplessness type behavior.  Encouraged to perform appropriate activities without therapist assist (ie. Operating bed controls). Pt left supine w/rails up x 3, alarm set, bed in lowest position, and needs in reach.    Therapy Documentation Precautions:  Precautions Precautions: Fall Precaution Comments: flaccid LUE (protect from subluxation); sling when OOB for comfort; skin integrity (sacral wound); monitor vitals Required Braces or Orthoses: Sling Restrictions Weight Bearing Restrictions: No    Therapy/Group: Individual Therapy  Rada Hay, PT   Shearon Balo 06/14/2020, 12:45 PM

## 2020-06-14 NOTE — Progress Notes (Signed)
Occupational Therapy Session Note  Patient Details  Name: Collin Brown MRN: [id number] Date of Birth: 1986/05/11  Today's Date: 06/14/2020 OT Individual Time: 0947-0962 OT Individual Time Calculation (min): 41 min    Short Term Goals: Week 1:  OT Short Term Goal 1 (Week 1): Pt will complete sit<>stand with mod assist +2 in preperation for LB ADLs OT Short Term Goal 2 (Week 1): Pt will achieve full PROM elbow flexion with minimal to no pain. OT Short Term Goal 3 (Week 1): Pt will complete UB dressing with mod assist. OT Short Term Goal 4 (Week 1): Pt will complete UB bathing using long handled sponge as needed with min assist.  Skilled Therapeutic Interventions/Progress Updates:     Pt received in bed with unrated pain in L wrist and elbow with AAROM. Pt reporting quads, "are not up to standing yet from all that walking earlier"  ADL:  Pt grooms seated at EOB with VC for use of LUE to hold toothbrush to apply toothpaste Pt completes footwear with S to doff socks with dressing stick after demo and MIN A to don B shoes (A for L shoe only to pull tongue up) using shoe funnel and dressing stick. VC for use of LUE as stabilizer to place shoe funnel    Therapeutic exercise Brief AAROM provided at LUE in all planes in prep for light WB   Therapeutic activity With L hand WB on L knee pt reaching dynamically in large ranges outside BOS to obtain horse shoes and toss at target anteriorly for deep propioceptive input into L arm  Pt left at end of session in bed with exit alarm on, call light in reach and all needs met   Today's Date: 06/14/2020 OT Individual Time: 1530-1600 OT Individual Time Calculation (min): 41 min  Session 2: pt received in bed agreeable to OT. Pt with unreported pain in L arm. Pt completes supine>sitting EOB with MIN A overall. Pt dons B shoes with shoe funnel and dressing stick with Supervision! Pt completes 3 sit to stands with 5 rounds of lateral weight shifting,  lateral stepping and anterior stepping with RUE on bed rail. Pt requires prolonged rest breaks in between stands. Exited session with pt seated in bed, exit alarm on and call light in reach    Therapy Documentation Precautions:  Precautions Precautions: Fall Precaution Comments: flaccid LUE (protect from subluxation); sling when OOB for comfort; skin integrity (sacral wound); monitor vitals Required Braces or Orthoses: Sling Restrictions Weight Bearing Restrictions: No General:   Vital Signs:  Pain: Pain Assessment Pain Scale: 0-10 Pain Score: 5  ADL: ADL Upper Body Bathing: Maximal assistance Where Assessed-Upper Body Bathing: Edge of bed Lower Body Bathing: Other (comment) (+2 assist) Where Assessed-Lower Body Bathing: Edge of bed, Bed level (attempted to wash buttocks but quickly fatigued; therefore completed at bed level sidelying) Upper Body Dressing: Maximal assistance Where Assessed-Upper Body Dressing: Edge of bed Lower Body Dressing: Other (Comment) (attempted in standing but fatigued; bed level +2 total assist) Where Assessed-Lower Body Dressing: Edge of bed, Bed level Toileting: Dependent Where Assessed-Toileting: Bed level (urinal and incontinent of bowel) Vision   Perception    Praxis   Exercises:   Other Treatments:     Therapy/Group: Individual Therapy  Tonny Branch 06/14/2020, 12:28 PM

## 2020-06-14 NOTE — Progress Notes (Signed)
Physical Therapy Session Note  Patient Details  Name: Collin Brown MRN: [id number] Date of Birth: 02-09-1986  Today's Date: 06/14/2020 PT Individual Time: 8588-5027 PT Individual Time Calculation (min): 45 min   Short Term Goals: Week 1:  PT Short Term Goal 1 (Week 1): Pt will perform bed mobility with maxA +1 person PT Short Term Goal 2 (Week 1): Pt will perform bed<>chair transfers with maxA and LRAD PT Short Term Goal 3 (Week 1): Pt will tolerate sitting in chair/recliner for >1 hour with stable vital signs PT Short Term Goal 4 (Week 1): Pt will begin initiating pre-gait training with maxA and LRAD  Skilled Therapeutic Interventions/Progress Updates:     Patient in bed upon PT arrival. Patient alert and agreeable to PT session. Patient reported 7/10 L arm pain during session, RN made aware. PT provided repositioning, rest breaks, and distraction as pain interventions throughout session.   Vitals: sitting BP 148//106, HR 104; standing 155/94, HR 108 Decided to leave abdominal binder off due to elevated BP this session. RN and following therapist aware. Educated patient on signs/symptoms of OH and to call RN if any occur in sitting. Patient stated understanding.   Therapeutic Activity: Bed Mobility: Donned B TED hose with total A bed level at beginning of session. Donned pants with total A performing bridging x2 and rolling L/R with min A to pull pants over hips. Provided min cues for L arm management during rolling. Patient performed supine to sit with min A +2 for safety and increased effort and use of momentum with mobility. Provided verbal cues for rolling to the R, setting his elbow, and pushing up to his elbow then hand to sit up. Transfers: Patient performed sit to stand and pivoted to the chair with mod-min +2 using TIS arm rests for steadying support. Provided verbal cues for scooting forward, forward weight shift, and reaching back to sit.  Therapeutic Exercise: Performed  joint ROM of L upper extremity (wrist flexion/extension, elbow flexion extension, shoulder flexion to 90 deg, abduction to 90 deg, and ER at 90 deg shoulder flexion/abduction with prolonged stretch at end range 5x10-20 sec  Assessed Roho cushion pressure with patient in TIS w/c. Removed air from cushion to 2" of inflation from the bottom of the seat. Patient reported reduced sacral pressure after.   Patient in TIS w/c at end of session with breaks locked and all needs within reach.    Therapy Documentation Precautions:  Precautions Precautions: Fall Precaution Comments: flaccid LUE (protect from subluxation); sling when OOB for comfort; skin integrity (sacral wound); monitor vitals Required Braces or Orthoses: Sling Restrictions Weight Bearing Restrictions: No General:   Vital Signs: Therapy Vitals Temp: 97.8 F (36.6 C) Temp Source: Oral Pulse Rate: 100 Resp: 20 BP: 134/87 Patient Position (if appropriate): Lying Oxygen Therapy SpO2: 96 % O2 Device: Nasal Cannula Pain: Pain Assessment Pain Scale: 0-10 Pain Score: 3  Pain Type: Chronic pain Pain Location: Arm Pain Orientation: Left Pain Descriptors / Indicators: Aching Pain Frequency: Constant Patients Stated Pain Goal: 3 Pain Intervention(s): Medication (See eMAR) Mobility:   Locomotion :    Trunk/Postural Assessment :    Balance:   Exercises:   Other Treatments:      Therapy/Group: Individual Therapy  Wandell Scullion L Yulia Ulrich PT, DPT  06/14/2020, 4:38 PM

## 2020-06-14 NOTE — Plan of Care (Signed)
  Problem: RH BOWEL ELIMINATION Goal: RH STG MANAGE BOWEL WITH ASSISTANCE Description: STG Manage Bowel with  mod Assistance. Outcome: Progressing Goal: RH STG MANAGE BOWEL W/MEDICATION W/ASSISTANCE Description: STG Manage Bowel with Medication with Assistance. Outcome: Progressing   Problem: RH SKIN INTEGRITY Goal: RH STG SKIN FREE OF INFECTION/BREAKDOWN Outcome: Progressing Goal: RH STG MAINTAIN SKIN INTEGRITY WITH ASSISTANCE Description: STG Maintain Skin Integrity With  mod Assistance. Outcome: Progressing Goal: RH STG ABLE TO PERFORM INCISION/WOUND CARE W/ASSISTANCE Description: STG Able To Perform Incision/Wound Care With mod Assistance. Outcome: Progressing   

## 2020-06-15 ENCOUNTER — Inpatient Hospital Stay (HOSPITAL_COMMUNITY): Payer: BC Managed Care – PPO | Admitting: Occupational Therapy

## 2020-06-15 ENCOUNTER — Inpatient Hospital Stay (HOSPITAL_COMMUNITY): Payer: BC Managed Care – PPO | Admitting: Physical Therapy

## 2020-06-15 ENCOUNTER — Inpatient Hospital Stay (HOSPITAL_COMMUNITY): Payer: BC Managed Care – PPO

## 2020-06-15 DIAGNOSIS — E1169 Type 2 diabetes mellitus with other specified complication: Secondary | ICD-10-CM | POA: Diagnosis not present

## 2020-06-15 DIAGNOSIS — I10 Essential (primary) hypertension: Secondary | ICD-10-CM | POA: Diagnosis not present

## 2020-06-15 DIAGNOSIS — S143XXS Injury of brachial plexus, sequela: Secondary | ICD-10-CM | POA: Diagnosis not present

## 2020-06-15 DIAGNOSIS — G7281 Critical illness myopathy: Secondary | ICD-10-CM | POA: Diagnosis not present

## 2020-06-15 LAB — GLUCOSE, CAPILLARY
Glucose-Capillary: 100 mg/dL — ABNORMAL HIGH (ref 70–99)
Glucose-Capillary: 125 mg/dL — ABNORMAL HIGH (ref 70–99)
Glucose-Capillary: 122 mg/dL — ABNORMAL HIGH (ref 70–99)
Glucose-Capillary: 140 mg/dL — ABNORMAL HIGH (ref 70–99)

## 2020-06-15 MED ORDER — ONDANSETRON HCL 4 MG PO TABS
4.0000 mg | ORAL_TABLET | Freq: Three times a day (TID) | ORAL | Status: DC | PRN
  Administered 2020-06-15 – 2020-06-20 (×11): 4 mg via ORAL
  Filled 2020-06-15 (×11): qty 1

## 2020-06-15 NOTE — Progress Notes (Signed)
Occupational Therapy Session Note  Patient Details  Name: Collin Brown MRN: [id number] Date of Birth: [birth year]  Today's Date: 06/15/2020 OT Individual Time: 1403-1501 OT Individual Time Calculation (min): 58 min    Short Term Goals: Week 1:  OT Short Term Goal 1 (Week 1): Pt will complete sit<>stand with mod assist +2 in preperation for LB ADLs OT Short Term Goal 2 (Week 1): Pt will achieve full PROM elbow flexion with minimal to no pain. OT Short Term Goal 3 (Week 1): Pt will complete UB dressing with mod assist. OT Short Term Goal 4 (Week 1): Pt will complete UB bathing using long handled sponge as needed with min assist.  Skilled Therapeutic Interventions/Progress Updates:    Pt resting in bed to start session with O2 sats at 96% on 2Ls nasal cannula.  He was able to transfer to the wheelchair from the EOB with min assist.  He was able to work on transferring from the wheelchair to the recliner as he requested to sit in this after therapy.  He needed mod assist for sit to stand from the lower recliner but only min assist when the Roho cushion was placed in the chair.  Next, he was taken to the dayroom where he engaged in standing intervals to build endurance while engaged in the Wii activity.  He was able to complete standing intervals of 3.5, 4.5, and almost 5 mins of standing.  HR increased into the high 80s post standing with O2 sats at 88-91% on room air.  He needed min assist for sit to stand from the wheelchair through each interval.  Finished session with return to the room and pt transferring back to the recliner with the call button and phone in reach.    Therapy Documentation Precautions:  Precautions Precautions: Fall Precaution Comments: flaccid LUE (protect from subluxation); sling when OOB for comfort; skin integrity (sacral wound); monitor vitals Required Braces or Orthoses: Sling Restrictions Weight Bearing Restrictions: No  Pain: Pain Assessment Pain Scale:  0-10 Pain Score: 3  Faces Pain Scale: Hurts a little bit Pain Type: Chronic pain Pain Location: Arm Pain Orientation: Left Pain Descriptors / Indicators: Aching Pain Onset: On-going Patients Stated Pain Goal: 3 Pain Intervention(s): Medication (See eMAR) Multiple Pain Sites: No ADL: See Care Tool Section for some details of mobility and selfcare  Therapy/Group: Individual Therapy  Raigen Jagielski OTR/L 06/15/2020, 4:19 PM

## 2020-06-15 NOTE — Progress Notes (Signed)
Physical Therapy Session Note  Patient Details  Name: Collin Brown MRN: [id number] Date of Birth: 27-Nov-1985  Today's Date: 06/15/2020 PT Individual Time: 1607-1700 PT Individual Time Calculation (min): 53 min   Short Term Goals: Week 1:  PT Short Term Goal 1 (Week 1): Pt will perform bed mobility with maxA +1 person PT Short Term Goal 2 (Week 1): Pt will perform bed<>chair transfers with maxA and LRAD PT Short Term Goal 3 (Week 1): Pt will tolerate sitting in chair/recliner for >1 hour with stable vital signs PT Short Term Goal 4 (Week 1): Pt will begin initiating pre-gait training with maxA and LRAD  Skilled Therapeutic Interventions/Progress Updates:    Pt received sitting in recliner and agreeable to therapy session stating he has done well sitting up between therapy sessions today totaling 4hours. Wearing L UE sling - therapist repositioned for improved support during session. Wearing B LE thigh high TED hose (did not don abdominal binder). Received and maintained on 1L of O2 via nasal cannula - SpO2 93% or higher with activity. Pt reports need to use bathroom. Sit>stand recliner>R UE support on w/c arm rest with mod assist of 1 and 2 attempts to come into standing. Standing with intermittent R UE support on w/c arm rest during total assist LB clothing management - provided urinal total assist but then pt reported need to have BM - +2 assist placed BSC behind patient. Remainder of sit<>stand transfers during session with min assist using R UE support on armrests as needed. Continent of bladder and bowel - total assist peri-care. R stand pivot to TIS w/c using R UE support on arm rest with min assist of 1 for balance. Pt reports onset of nausea - provided cool wash cloth and vitals assessed: BP 147/98 (MAP 113), HR 106bpm, SpO2 96%. Provided tilted back rest break in TIS w/c.Transported to/from gym in w/c for time management and energy conservation. Gait training 35ft, 73ft (seated break  between) using Carley Hammed walker with min/mod assist of 1 for AD management and balance, +2 assist for O2 line management and w/c follow - therapist assisting with L UE placement on AD - pt demos excessive B LE hip external rotation with ankles landing in slight inversion and excessive B LE hip adduction with narrow BOS (pt reports this is his baseline walking mechanics, noticed shoes worn to support this statement) but still cued for some improvement to increase pt safety and balance. Pt reports some nausea after each bout of gait; after 1st: HR 115bpm decreasing to 107bpm and SpO2 95%; after 2nd: BP 144/102 (MAP 114), HR 108bpm, SpO2 93%. Pt requesting for assistance back to bed - transported back to room in w/c. R stand pivot w/c>EOB using R UE support on armrests/bedrail with min assist for balance. Sit>supine, HOB partially elevated and using bedrail support, with min assist for B LE management into the bed. Pt left supine with needs in reach, bed alarm on, and L UE sling doffed then therapeutically supported on pillows.  Therapy Documentation Precautions:  Precautions Precautions: Fall Precaution Comments: flaccid LUE (protect from subluxation); sling when OOB for comfort; skin integrity (sacral wound); monitor vitals Required Braces or Orthoses: Sling Restrictions Weight Bearing Restrictions: No  Pain:   Reports some pain/discomfort with tactile stimulus to L fingers - avoided excessive input to manage pain - therapist readjusting sling throughout session for improved support.   Therapy/Group: Individual Therapy  Ginny Forth , PT, DPT, CSRS  06/15/2020, 2:59 PM

## 2020-06-15 NOTE — Progress Notes (Signed)
[facility] PHYSICAL MEDICINE & REHABILITATION PROGRESS NOTE   Subjective/Complaints: No new issues. Pain in left arm seems a bit better. Pleased that he walked 100' yesterday  ROS: Patient denies fever, rash, sore throat, blurred vision, nausea, vomiting, diarrhea, cough, shortness of breath or chest pain, joint or back pain, headache, or mood change.      Objective:   No results found. No results for input(s): WBC, HGB, HCT, PLT in the last 72 hours. No results for input(s): NA, K, CL, CO2, GLUCOSE, BUN, CREATININE, CALCIUM in the last 72 hours.  Intake/Output Summary (Last 24 hours) at 06/15/2020 0924 Last data filed at 06/15/2020 0100 Gross per 24 hour  Intake 480 ml  Output 900 ml  Net -420 ml     Pressure Injury 06/08/20 Coccyx Unstageable - Full thickness tissue loss in which the base of the injury is covered by slough (yellow, tan, gray, green or brown) and/or eschar (tan, brown or black) in the wound bed. yellow necrotic tissue in the crease o (Active)  06/08/20 1830 (with Lizabeth Leyden RN)  Location: Coccyx  Location Orientation:   Staging: Unstageable - Full thickness tissue loss in which the base of the injury is covered by slough (yellow, tan, gray, green or brown) and/or eschar (tan, brown or black) in the wound bed.  Wound Description (Comments): yellow necrotic tissue in the crease originally put in as a nonpressure wound  Present on Admission: Yes    Physical Exam: Vital Signs Blood pressure 131/87, pulse 94, temperature 97.8 F (36.6 C), temperature source Oral, resp. rate 19, height 5\' 10"  (1.778 m), weight (!) 159.2 kg, SpO2 99 %. Constitutional: No distress . Vital signs reviewed. HEENT: EOMI, oral membranes moist Neck: supple Cardiovascular: RRR without murmur. No JVD    Respiratory/Chest: CTA Bilaterally without wheezes or rales. Normal effort    GI/Abdomen: BS +, non-tender, non-distended Ext: no clubbing, cyanosis, or edema Psych: pleasant and  cooperative Skin: eschar on toes, stable. 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 Cranial nerves 2-12 are intact. DTR's 1+.  Fine motor coordination is intact. No tremors. Motor function is grossly 5/5 RUE. LUE weak in ABD/EE,WE, decreased sensation along lateral wrist and arm. Tolerates movement of left arm more today.  Musculoskeletal: Full ROM, No pain with AROM or PROM in the neck, trunk, or LE's.  Posture appropriate       Assessment/Plan: 1. 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  Physiatrist is providing close team supervision and 24 hour management of active medical problems listed below.  Physiatrist and rehab team continue to assess barriers to discharge/monitor patient progress toward functional and medical goals  Care Tool:  Bathing    Body parts bathed by patient: Chest, Abdomen, Front perineal area, Right upper leg, Left upper leg, Face   Body parts bathed by helper: Right arm, Left arm, Buttocks, Left lower leg, Right lower leg     Bathing assist Assist Level: 2 Helpers     Upper Body Dressing/Undressing Upper body dressing   What is the patient wearing?: Hospital gown only    Upper body assist Assist Level: Moderate Assistance - Patient 50 - 74%    Lower Body Dressing/Undressing Lower body dressing      What is the patient wearing?: Incontinence brief  Lower body assist Assist for lower body dressing: 2 Helpers     Toileting Toileting    Toileting assist Assist for toileting: Minimal Assistance - Patient > 75% (Assist the pt with the urinal)     Transfers Chair/bed transfer  Transfers assist  Chair/bed transfer activity did not occur: Safety/medical concerns  Chair/bed transfer assist  level: 2 Helpers     Locomotion Ambulation   Ambulation assist   Ambulation activity did not occur: Safety/medical concerns  Assist level: 2 helpers (assist required for 02 management, wc follow) Assistive device: Walker-Eva Max distance: 52   Walk 10 feet activity   Assist  Walk 10 feet activity did not occur: Safety/medical concerns  Assist level: 2 helpers Assistive device: Walker-Eva   Walk 50 feet activity   Assist Walk 50 feet with 2 turns activity did not occur: Safety/medical concerns  Assist level: 2 helpers Assistive device: [employer] 150 feet activity   Assist Walk 150 feet activity did not occur: Safety/medical concerns         Walk 10 feet on uneven surface  activity   Assist Walk 10 feet on uneven surfaces activity did not occur: Safety/medical concerns         Wheelchair     Assist Will patient use wheelchair at discharge?: No   Wheelchair activity did not occur: Safety/medical concerns         Wheelchair 50 feet with 2 turns activity    Assist    Wheelchair 50 feet with 2 turns activity did not occur: Safety/medical concerns       Wheelchair 150 feet activity     Assist  Wheelchair 150 feet activity did not occur: Safety/medical concerns       Blood pressure 131/87, pulse 94, temperature 97.8 F (36.6 C), temperature source Oral, resp. rate 19, height 5\' 10"  (1.778 m), weight (!) 159.2 kg, SpO2 99 %.  Medical Problem List and Plan: 1.  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             -Continue CIR             Weaning supplemental oxygen as tolerated--making progress with this 2.  Antithrombotics: -DVT/anticoagulation: Lovenox 85 mg daily.               -antiplatelet therapy: Aspirin 81 mg daily 3. Pain  Management: Advil as needed             Oxycodone as needed             11/5: pain improved with 400mg  tid of gabapentin 4. Mood: Provide emotional support             -antipsychotic agents: N/A 5. Neuropsych: This patient is capable of making decisions on his own behalf. 6. 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 Pressure relief, nutrition 7. Fluids/Electrolytes/Nutrition: Routine in and outs.  CMP ordered. 8.  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  Patient completed 5-day course Solu-Medrol             Supportive care with range of motion  Recommend NCV/EMG as outpatient.  -increased gabapentin as above 9.  New onset diabetes mellitus, hemoglobin A1c 7.2.  NovoLog 4 units 3 times daily, Levemir 15 units twice daily.  Provide diabetic teaching             CBG (last 3)  Recent Labs    06/14/20 1624 06/14/20 2005 06/15/20 0612  GLUCAP 128* 124* 100*   -11/5: excellent control 10.  Hypertension with tachycardia.  Lopressor 50 mg twice daily.               10/31 pt hypotensive yesterday with OT which wasn't unexpected   -TEDS, Abd binder   -push po fluids   -continue to acclimate, get OOB   -reassured him that this is to be expected   -continue to monitor  11/3: Increased Lopressor to 75mg  BID  11/5 bp relatively controlled today 11.  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  Dietary follow-up    LOS: 7 days A FACE TO FACE EVALUATION WAS PERFORMED  06/15/2020, 9:24 AM

## 2020-06-15 NOTE — Progress Notes (Signed)
Physical Therapy Session Note  Patient Details  Name: Collin Brown MRN: [id number] Date of Birth: [birth year]  Today's Date: 06/15/2020 PT Individual Time: 1006-1059 PT Individual Time Calculation (min): 53 min   Short Term Goals: Week 1:  PT Short Term Goal 1 (Week 1): Pt will perform bed mobility with maxA +1 person PT Short Term Goal 2 (Week 1): Pt will perform bed<>chair transfers with maxA and LRAD PT Short Term Goal 3 (Week 1): Pt will tolerate sitting in chair/recliner for >1 hour with stable vital signs PT Short Term Goal 4 (Week 1): Pt will begin initiating pre-gait training with maxA and LRAD  Skilled Therapeutic Interventions/Progress Updates:     Pt received supine in bed and agrees to therapy. Complains of pain in L arm. Number not provided. PT provides repositioning with sling to help manage arm pain. TotalA provided to don TED hose while supine in bed. Supine to sit with modA. Seated EOB, PT threads sweat pants onto both legs and dons pt's shoes. L arm sling and abdominal binder also donned at EOB. Pt stands from elevated bed with modA +2 and PT provides totalA to pull up pants. Pt able to maintain standing at EOB with minA +1 and performs stand step transfer to tilt in space WC with modA +1. WC transport to gym for energy conservation.  Pt performs gait training in parallel bars. Sit to stand with minA +1 using bar support with R upper extremity. Pt ambulates forward and backward slowly with +2 WC follow and oxygen line management, with PT providing CGA at hips for stability. Multimodal cueing for deep pursed lip breathing, maintaining upright gaze for improved posture and balance, and safe positioning with parallel bars. Pt verbalizes some nausea after first bout of ambulation (~20'). BP and O2 sats checked and read 139/89 and O2 sats 98% on 2L. Abdominal binder removed and supplemental oxygen titrated to 1L. Pt ambulates similar distance and again verbalizes increasing nausea.  BP 130/90 and O2 sats 95%. Nausea does not subside and pt requests to return to room. Pt left in tilt in space wheelchair with RN present.   Therapy Documentation Precautions:  Precautions Precautions: Fall Precaution Comments: flaccid LUE (protect from subluxation); sling when OOB for comfort; skin integrity (sacral wound); monitor vitals Required Braces or Orthoses: Sling Restrictions Weight Bearing Restrictions: No    Therapy/Group: Individual Therapy  Beau Fanny, PT, DPT 06/15/2020, 12:25 PM

## 2020-06-16 ENCOUNTER — Inpatient Hospital Stay (HOSPITAL_COMMUNITY): Payer: BC Managed Care – PPO | Admitting: Occupational Therapy

## 2020-06-16 ENCOUNTER — Inpatient Hospital Stay (HOSPITAL_COMMUNITY): Payer: BC Managed Care – PPO

## 2020-06-16 DIAGNOSIS — G7281 Critical illness myopathy: Secondary | ICD-10-CM | POA: Diagnosis not present

## 2020-06-16 LAB — GLUCOSE, CAPILLARY
Glucose-Capillary: 128 mg/dL — ABNORMAL HIGH (ref 70–99)
Glucose-Capillary: 103 mg/dL — ABNORMAL HIGH (ref 70–99)
Glucose-Capillary: 114 mg/dL — ABNORMAL HIGH (ref 70–99)
Glucose-Capillary: 139 mg/dL — ABNORMAL HIGH (ref 70–99)

## 2020-06-16 IMAGING — DX DG CHEST 1V PORT
2 series · 2 of 2 positions shown · non-contrast
Comparison: CT [DATE], radiograph [DATE]

CLINICAL DATA: Fever

EXAM:
PORTABLE CHEST 1 VIEW

[chest ap (1 of 2)]
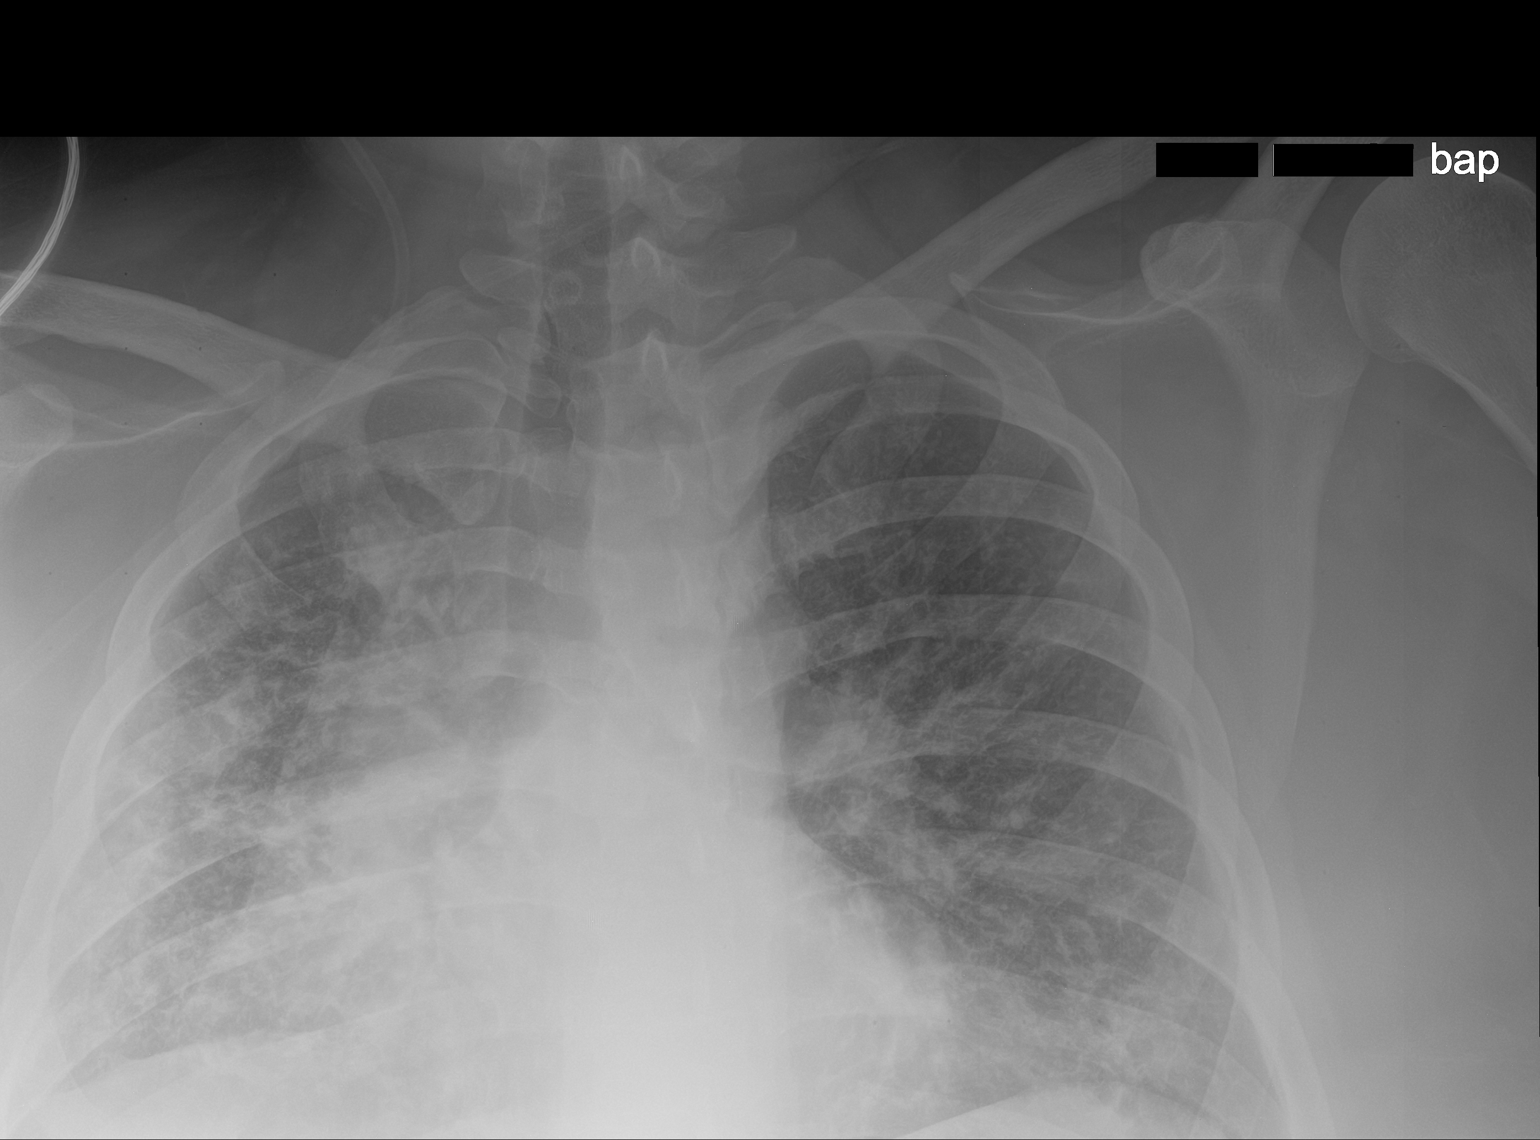

[chest ap (2 of 2)]
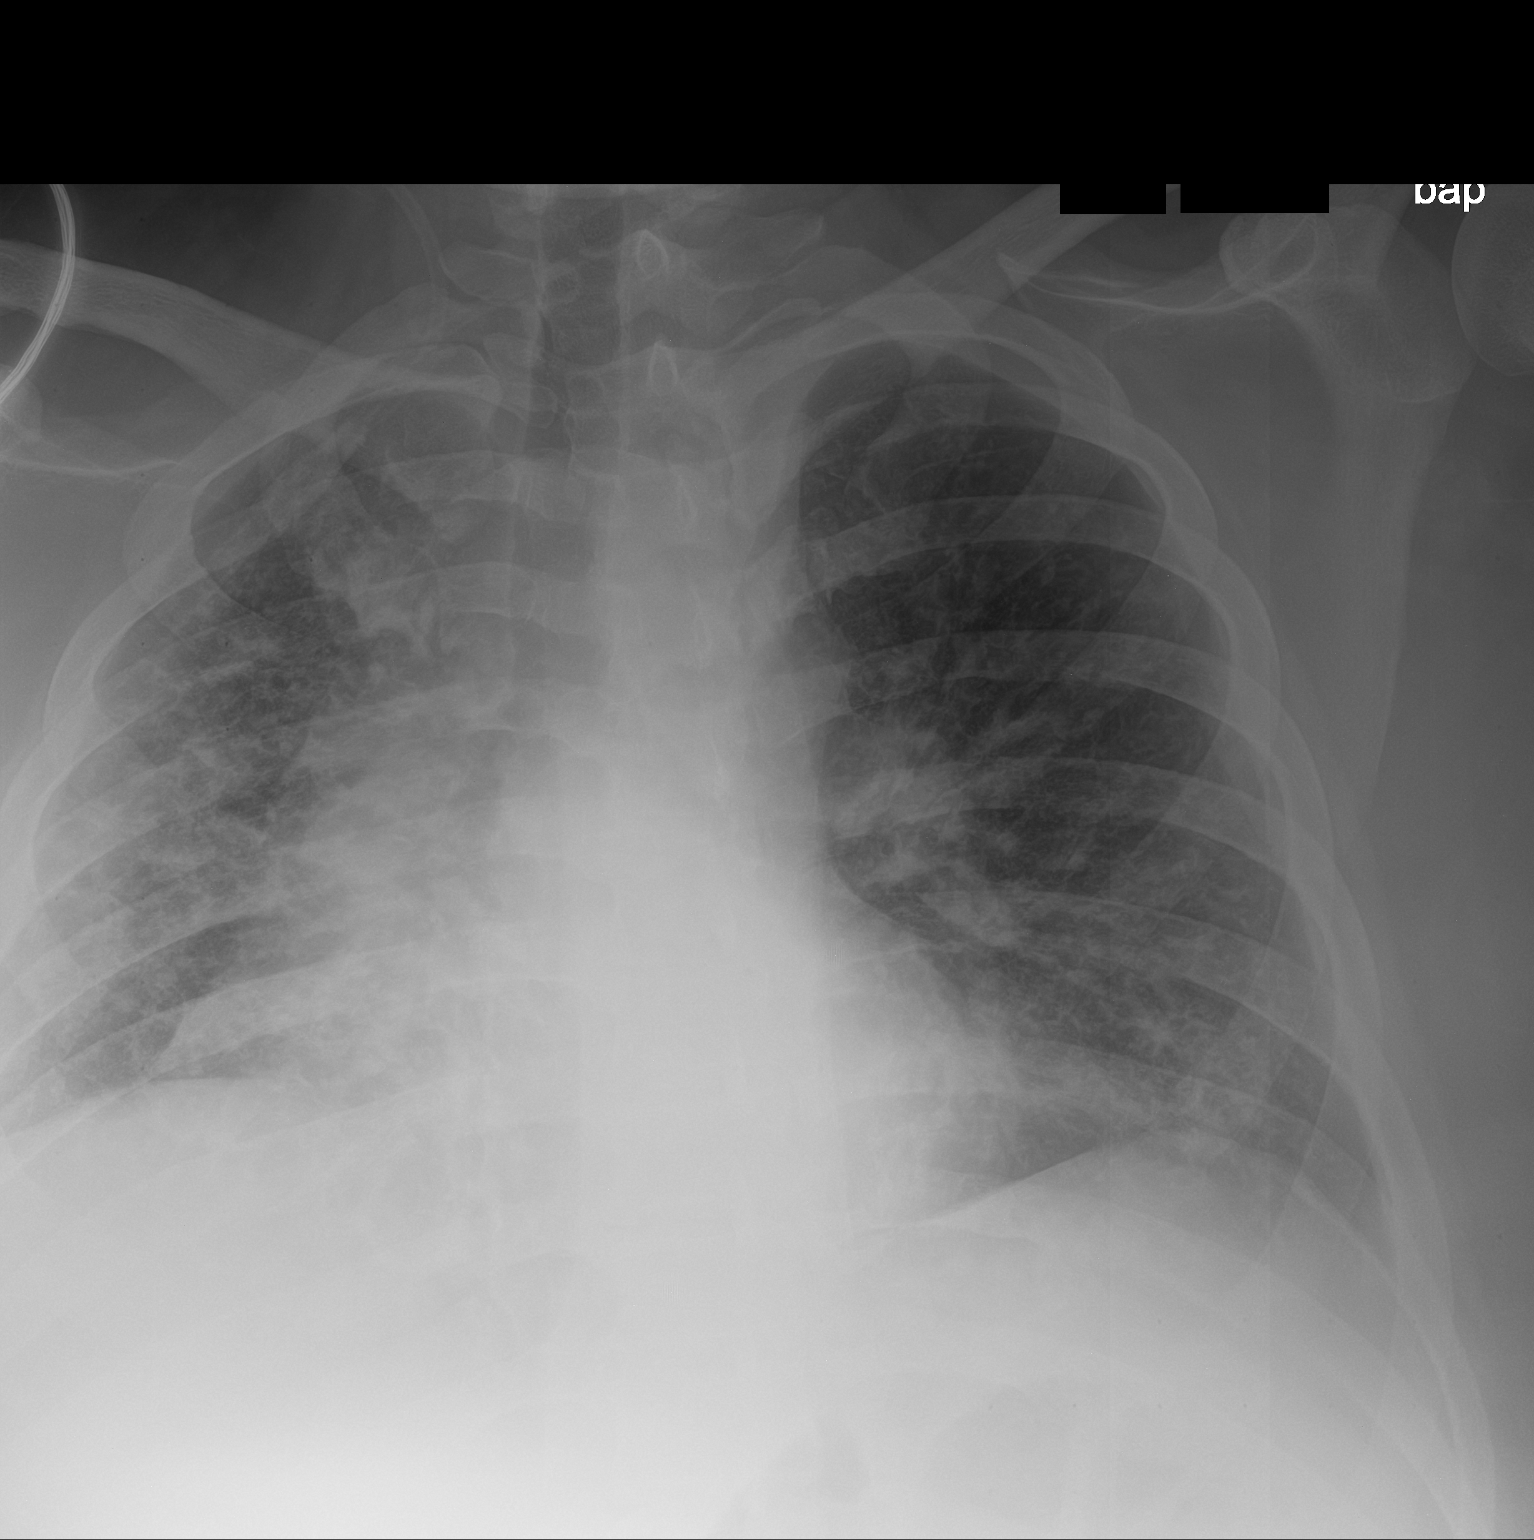

[2 of 2 positions shown; findings below may reference images not displayed]

FINDINGS: There are persistent heterogeneous airspace and interstitial
opacities throughout both lungs with a mid to lower lung
predominance, slightly improved from comparison imaging. Cardiac
silhouette is similar to prior accounting for slight patient
rotation. No pneumothorax or visible effusion. No acute osseous or
soft tissue abnormality.
IMPRESSION: Persistent albeit slightly improved heterogeneous airspace and
interstitial opacities throughout both lungs.

## 2020-06-16 MED ORDER — PROCHLORPERAZINE MALEATE 5 MG PO TABS
5.0000 mg | ORAL_TABLET | Freq: Four times a day (QID) | ORAL | Status: DC | PRN
  Administered 2020-06-16 (×3): 5 mg via ORAL
  Filled 2020-06-16 (×3): qty 1

## 2020-06-16 MED ORDER — VANCOMYCIN HCL 2000 MG/400ML IV SOLN
2000.0000 mg | Freq: Once | INTRAVENOUS | Status: AC
  Administered 2020-06-17: 2000 mg via INTRAVENOUS
  Filled 2020-06-16: qty 400

## 2020-06-16 MED ORDER — PIPERACILLIN-TAZOBACTAM 3.375 G IVPB: 3.3750 g | Freq: Three times a day (TID) | INTRAVENOUS | Status: DC

## 2020-06-16 NOTE — Progress Notes (Signed)
[facility] PHYSICAL MEDICINE & REHABILITATION PROGRESS NOTE   Subjective/Complaints: Has pain in his arm- Gabapentin helps a little. Does not make him sleepy Having loose stools Mild fever +nausea  ROS: Patient denies constipation  Objective:   No results found. No results for input(s): WBC, HGB, HCT, PLT in the last 72 hours. No results for input(s): NA, K, CL, CO2, GLUCOSE, BUN, CREATININE, CALCIUM in the last 72 hours.  Intake/Output Summary (Last 24 hours) at 06/16/2020 1020 Last data filed at 06/16/2020 0200 Gross per 24 hour  Intake 440 ml  Output 1952 ml  Net -1512 ml     Pressure Injury 06/08/20 Coccyx Unstageable - Full thickness tissue loss in which the base of the injury is covered by slough (yellow, tan, gray, green or brown) and/or eschar (tan, brown or black) in the wound bed. yellow necrotic tissue in the crease o (Active)  06/08/20 1830 (with Lizabeth Leyden RN)  Location: Coccyx  Location Orientation:   Staging: Unstageable - Full thickness tissue loss in which the base of the injury is covered by slough (yellow, tan, gray, green or brown) and/or eschar (tan, brown or black) in the wound bed.  Wound Description (Comments): yellow necrotic tissue in the crease originally put in as a nonpressure wound  Present on Admission: Yes    Physical Exam: Vital Signs Blood pressure 137/83, pulse (!) 125, temperature 98.6 F (37 C), resp. rate (!) 24, height 5\' 10"  (1.778 m), weight (!) 160.4 kg, SpO2 93 %. General: Alert and oriented x 3, No apparent distress HEENT: Head is normocephalic, atraumatic, PERRLA, EOMI, sclera anicteric, oral mucosa pink and moist, dentition intact, ext ear canals clear,  Neck: Supple without JVD or lymphadenopathy Heart: Tachycardia. No murmurs rubs or gallops Chest: CTA bilaterally without wheezes, rales, or rhonchi; no distress Abdomen: Soft, non-tender, non-distended, bowel sounds positive.  Psych: pleasant and cooperative Skin:  eschar on toes, stable. 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 Cranial nerves 2-12 are intact. DTR's 1+.  Fine motor coordination is intact. No tremors. Motor function is grossly 5/5 RUE. LUE weak in ABD/EE,WE, decreased sensation along lateral wrist and arm. Tolerates movement of left arm more today.  Musculoskeletal: Full ROM, No pain with AROM or PROM in the neck, trunk, or LE's.  Posture appropriate       Assessment/Plan: 1. 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  Physiatrist is providing close team supervision and 24 hour management of active medical problems listed below.  Physiatrist and rehab team continue to assess barriers to discharge/monitor patient progress toward functional and medical goals  Care Tool:  Bathing    Body parts bathed by patient: Chest, Abdomen, Front perineal area, Right upper leg, Left upper leg, Face   Body parts bathed by helper: Right arm, Left arm, Buttocks, Left lower leg, Right lower leg     Bathing assist Assist Level: 2 Helpers     Upper Body Dressing/Undressing Upper body dressing   What is the patient wearing?: Hospital gown only    Upper body assist Assist Level: Moderate Assistance - Patient 50 - 74%    Lower Body Dressing/Undressing Lower body dressing      What is the patient wearing?: Incontinence brief     Lower body assist Assist for lower body  dressing: 2 Sports administrator assist Assist for toileting: Minimal Assistance - Patient > 75% (Assist the pt with the urinal)     Transfers Chair/bed transfer  Transfers assist  Chair/bed transfer activity did not occur: Safety/medical concerns  Chair/bed transfer assist level: Moderate  Assistance - Patient 50 - 74% Chair/bed transfer assistive device: Armrests   Locomotion Ambulation   Ambulation assist   Ambulation activity did not occur: Safety/medical concerns  Assist level: 2 helpers (min/mod A of 1 and +2 w/c follow & O2 line management) Assistive device: Walker-Eva Max distance: 57ft   Walk 10 feet activity   Assist  Walk 10 feet activity did not occur: Safety/medical concerns  Assist level: 2 helpers (min/mod A of 1 and +2 w/c follow & O2 line management) Assistive device: Walker-Eva   Walk 50 feet activity   Assist Walk 50 feet with 2 turns activity did not occur: Safety/medical concerns (did not perform turns)  Assist level: 2 helpers Assistive device: Walker-Eva    Walk 150 feet activity   Assist Walk 150 feet activity did not occur: Safety/medical concerns         Walk 10 feet on uneven surface  activity   Assist Walk 10 feet on uneven surfaces activity did not occur: Safety/medical concerns         Wheelchair     Assist Will patient use wheelchair at discharge?: No   Wheelchair activity did not occur: Safety/medical concerns         Wheelchair 50 feet with 2 turns activity    Assist    Wheelchair 50 feet with 2 turns activity did not occur: Safety/medical concerns       Wheelchair 150 feet activity     Assist  Wheelchair 150 feet activity did not occur: Safety/medical concerns       Blood pressure 137/83, pulse (!) 125, temperature 98.6 F (37 C), resp. rate (!) 24, height 5\' 10"  (1.778 m), weight (!) 160.4 kg, SpO2 93 %.  Medical Problem List and Plan: 1.  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             -Continue CIR             Weaning supplemental oxygen as tolerated--making progress with this 2.   Antithrombotics: -DVT/anticoagulation: Lovenox 85 mg daily.               -antiplatelet therapy: Aspirin 81 mg daily 3. Pain Management: Advil as needed             Oxycodone as needed             11/5: pain improved with 400mg  tid of gabapentin  11/6: stable 4. Mood: Provide emotional support             -antipsychotic agents: N/A 5. Neuropsych: This patient is capable of making decisions on his own behalf. 6. 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 Pressure relief, nutrition 7. Fluids/Electrolytes/Nutrition: Routine in and outs.  CMP ordered. 8.  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  Patient completed 5-day course  Solu-Medrol             Supportive care with range of motion             Recommend NCV/EMG as outpatient.  -increased gabapentin as above 9.  New onset diabetes mellitus, hemoglobin A1c 7.2.  NovoLog 4 units 3 times daily, Levemir 15 units twice daily.  Provide diabetic teaching             CBG (last 3)  Recent Labs    06/15/20 1712 06/15/20 1949 06/16/20 0542  GLUCAP 122* 140* 128*   -11/5: CBGs well controlled 10.  Hypertension with tachycardia.  Lopressor 50 mg twice daily.               10/31 pt hypotensive yesterday with OT which wasn't unexpected   -TEDS, Abd binder   -push po fluids   -continue to acclimate, get OOB   -reassured him that this is to be expected   -continue to monitor  11/3: Increased Lopressor to 75mg  BID  11/5 bp relatively controlled today 11.  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  Dietary follow-up 12. GI virus: nausea, loose stools, lower abdominal pain, mild fever, fatigue: hold therapy today. Zofran and Compazine ordered    LOS: 8 days A FACE TO FACE EVALUATION WAS PERFORMED  Collin Brown 06/16/2020, 10:20 AM

## 2020-06-16 NOTE — Progress Notes (Signed)
   06/16/20 0945  What Happened  Was fall witnessed? Yes  Who witnessed fall? Hassan Rowan NT  Patients activity before fall other (comment) (From [facility] to bed)  Point of contact buttocks;other (comment) (pt reports knees)  Was patient injured? No  Follow Up  MD notified MD Raulkar  Time MD notified 1010  Family notified Yes - comment (Mother)  Time family notified 1015  Additional tests No  Progress note created (see row info) Yes  Adult Fall Risk Assessment  Risk Factor Category (scoring not indicated) Fall has occurred during this admission (document High fall risk)  Patient Fall Risk Level High fall risk  Adult Fall Risk Interventions  Required Bundle Interventions *See Row Information* High fall risk - low, moderate, and high requirements implemented  Additional Interventions Use of appropriate toileting equipment (bedpan, BSC, etc.)  Screening for Fall Injury Risk (To be completed on HIGH fall risk patients) - Assessing Need for Low Bed  Risk For Fall Injury- Low Bed Criteria Previous fall this admission  Will Implement Low Bed and Floor Mats No - Criteria no longer met for low bed  Screening for Fall Injury Risk (To be completed on HIGH fall risk patients who do not meet crieteria for Low Bed) - Assessing Need for Floor Mats Only  Risk For Fall Injury- Criteria for Floor Mats None identified - No additional interventions needed  Neurological  Level of Consciousness Alert  Cognition Appropriate at baseline  Speech Clear  R Hand Grip Weak  L Hand Grip Absent   R Foot Dorsiflexion Moderate  L Foot Dorsiflexion Moderate  R Foot Plantar Flexion Moderate  L Foot Plantar Flexion Moderate  RUE Motor Response Purposeful movement  RUE Motor Strength 5  LUE Motor Response Non-purposeful movement  LUE Motor Strength 2  RLE Motor Response Purposeful movement  RLE Motor Strength 5  LLE Motor Response Purposeful movement  LLE Motor Strength 4  Neuro Symptoms None  Musculoskeletal   Musculoskeletal (WDL) X  Assistive Device Stedy  Generalized Weakness Yes  Weight Bearing Restrictions No  Integumentary  Integumentary (WDL) X (foam changed to sacral wound)  Skin Condition Other (Comment) (no changes )  Ecchymosis Location Abdomen  Ecchymosis Location Orientation Right;Left  Ecchymosis Intervention Other (Comment) (assessed)

## 2020-06-16 NOTE — Progress Notes (Signed)
Pharmacy Antibiotic Note  Collin Brown is a 34 y.o. male admitted to reheb unit on 06/08/2020 s/p admission for COVID PNA, now with fevers.  Pharmacy has been consulted for Vancomycin and Zosyn  dosing.  Plan: Vancomycin 2000 mg IV now, then 1250 mg IV q8h Zosyn 3.375 g IV q8h   Height: 5\' 10"  (177.8 cm) Weight: (!) 160.4 kg (353 lb 9.9 oz) IBW/kg (Calculated) : 73  Temp (24hrs), Avg:99.5 F (37.5 C), Min:98.6 F (37 C), Max:102.8 F (39.3 C)  Recent Labs  Lab 06/11/20 0841  WBC 13.8*  CREATININE 0.68    Estimated Creatinine Clearance: 200.6 mL/min (by C-G formula based on SCr of 0.68 mg/dL).    No Known Allergies   13/01/21 06/16/2020 11:53 PM

## 2020-06-16 NOTE — Progress Notes (Signed)
   06/16/20 0949  Assess: MEWS Score  Temp 98.6 F (37 C)  BP 137/83  Pulse Rate (!) 125  Resp (!) 24  SpO2 93 %  O2 Device Nasal Cannula  O2 Flow Rate (L/min) 1 L/min  Assess: MEWS Score  MEWS Temp 0  MEWS Systolic 0  MEWS Pulse 2  MEWS RR 1  MEWS LOC 0  MEWS Score 3  MEWS Score Color Yellow  Assess: if the MEWS score is Yellow or Red  Were vital signs taken at a resting state? No  Focused Assessment Change from prior assessment (see assessment flowsheet) (tachy)  Early Detection of Sepsis Score *See Row Information* Medium  MEWS guidelines implemented *See Row Information* Yes  Notify: Charge Nurse/RN  Name of Charge Nurse/RN Notified [employer] RN  Date Charge Nurse/RN Notified 06/16/20  Time Charge Nurse/RN Notified 0945  Notify: Provider  Provider Name/Title MD Raulkar  Date Provider Notified 06/16/20  Time Provider Notified 1010  Notification Type Call  Notification Reason Other (Comment) (fall and change in MEWS)  Response No new orders  Date of Provider Response 06/16/20  Time of Provider Response 1010

## 2020-06-17 ENCOUNTER — Encounter (HOSPITAL_COMMUNITY): Payer: Self-pay | Admitting: Physical Medicine & Rehabilitation

## 2020-06-17 DIAGNOSIS — U099 Post covid-19 condition, unspecified: Secondary | ICD-10-CM | POA: Diagnosis present

## 2020-06-17 DIAGNOSIS — Z7409 Other reduced mobility: Secondary | ICD-10-CM | POA: Diagnosis present

## 2020-06-17 DIAGNOSIS — G7281 Critical illness myopathy: Secondary | ICD-10-CM | POA: Diagnosis not present

## 2020-06-17 DIAGNOSIS — A419 Sepsis, unspecified organism: Secondary | ICD-10-CM | POA: Diagnosis not present

## 2020-06-17 LAB — URINALYSIS, ROUTINE W REFLEX MICROSCOPIC
Bilirubin Urine: NEGATIVE
Glucose, UA: NEGATIVE mg/dL
Hgb urine dipstick: NEGATIVE
Ketones, ur: NEGATIVE mg/dL
Nitrite: POSITIVE — AB
Protein, ur: NEGATIVE mg/dL
Specific Gravity, Urine: 1.014 (ref 1.005–1.030)
pH: 5 (ref 5.0–8.0)

## 2020-06-17 LAB — CBC WITH DIFFERENTIAL/PLATELET
Abs Immature Granulocytes: 0.05 10*3/uL (ref 0.00–0.07)
Basophils Absolute: 0 10*3/uL (ref 0.0–0.1)
Basophils Relative: 0 %
Eosinophils Absolute: 0 10*3/uL (ref 0.0–0.5)
Eosinophils Relative: 0 %
HCT: 36.1 % — ABNORMAL LOW (ref 39.0–52.0)
Hemoglobin: 11.4 g/dL — ABNORMAL LOW (ref 13.0–17.0)
Immature Granulocytes: 0 %
Lymphocytes Relative: 8 %
Lymphs Abs: 0.9 10*3/uL (ref 0.7–4.0)
MCH: 28.4 pg (ref 26.0–34.0)
MCHC: 31.6 g/dL (ref 30.0–36.0)
MCV: 89.8 fL (ref 80.0–100.0)
Monocytes Absolute: 1.1 10*3/uL — ABNORMAL HIGH (ref 0.1–1.0)
Monocytes Relative: 10 %
Neutro Abs: 9.3 10*3/uL — ABNORMAL HIGH (ref 1.7–7.7)
Neutrophils Relative %: 82 %
Platelets: 284 10*3/uL (ref 150–400)
RBC: 4.02 MIL/uL — ABNORMAL LOW (ref 4.22–5.81)
RDW: 17.1 % — ABNORMAL HIGH (ref 11.5–15.5)
WBC: 11.4 10*3/uL — ABNORMAL HIGH (ref 4.0–10.5)
nRBC: 0 % (ref 0.0–0.2)

## 2020-06-17 LAB — BASIC METABOLIC PANEL
Anion gap: 9 (ref 5–15)
BUN: 10 mg/dL (ref 6–20)
CO2: 28 mmol/L (ref 22–32)
Calcium: 8.7 mg/dL — ABNORMAL LOW (ref 8.9–10.3)
Chloride: 96 mmol/L — ABNORMAL LOW (ref 98–111)
Creatinine, Ser: 0.64 mg/dL (ref 0.61–1.24)
GFR, Estimated: >60 mL/min (ref >60)
Glucose, Bld: 149 mg/dL — ABNORMAL HIGH (ref 70–99)
Potassium: 3.5 mmol/L (ref 3.5–5.1)
Sodium: 133 mmol/L — ABNORMAL LOW (ref 135–145)

## 2020-06-17 LAB — C DIFFICILE (CDIFF) QUICK SCRN (NO PCR REFLEX)
C Diff antigen: NEGATIVE
C Diff interpretation: NOT DETECTED
C Diff toxin: NEGATIVE

## 2020-06-17 LAB — GLUCOSE, CAPILLARY
Glucose-Capillary: 121 mg/dL — ABNORMAL HIGH (ref 70–99)
Glucose-Capillary: 137 mg/dL — ABNORMAL HIGH (ref 70–99)
Glucose-Capillary: 105 mg/dL — ABNORMAL HIGH (ref 70–99)
Glucose-Capillary: 118 mg/dL — ABNORMAL HIGH (ref 70–99)

## 2020-06-17 LAB — LACTIC ACID, PLASMA: Lactic Acid, Venous: 0.6 mmol/L (ref 0.5–1.9)

## 2020-06-17 LAB — PROCALCITONIN: Procalcitonin: 0.35 ng/mL

## 2020-06-17 MED ORDER — INSULIN DETEMIR 100 UNIT/ML ~~LOC~~ SOLN
7.0000 [IU] | Freq: Once | SUBCUTANEOUS | Status: AC
  Administered 2020-06-17: 7 [IU] via SUBCUTANEOUS
  Filled 2020-06-17: qty 0.07

## 2020-06-17 MED ORDER — PROCHLORPERAZINE MALEATE 5 MG PO TABS
5.0000 mg | ORAL_TABLET | Freq: Four times a day (QID) | ORAL | Status: DC | PRN
  Administered 2020-06-17 – 2020-06-19 (×6): 5 mg via ORAL
  Filled 2020-06-17 (×6): qty 1

## 2020-06-17 MED ORDER — VANCOMYCIN HCL 1250 MG/250ML IV SOLN
1250.0000 mg | Freq: Three times a day (TID) | INTRAVENOUS | Status: DC
  Administered 2020-06-17 – 2020-06-18 (×4): 1250 mg via INTRAVENOUS
  Filled 2020-06-17 (×5): qty 250

## 2020-06-17 MED ORDER — PIPERACILLIN-TAZOBACTAM 3.375 G IVPB
3.3750 g | Freq: Three times a day (TID) | INTRAVENOUS | Status: DC
  Administered 2020-06-17 – 2020-06-18 (×5): 3.375 g via INTRAVENOUS
  Filled 2020-06-17 (×7): qty 50

## 2020-06-17 NOTE — Progress Notes (Addendum)
[place] PHYSICAL MEDICINE & REHABILITATION PROGRESS NOTE   Subjective/Complaints: Collin Brown continues to have fever (reduced with IV antibiotics), tachycardia (obtaining EKG now given irregular pulse), malaise despite IV antibiotics. UA is positive for UTI. Sepsis score 12, Mews 13, medicine consulted for additional recommendations  ROS: +diarrhea, malaise, foul smelling stool, fever, fatigue, weakness, pain in left arm  Objective:   DG CHEST PORT 1 VIEW  Result Date: 06/17/2020 CLINICAL DATA:  Fever EXAM: PORTABLE CHEST 1 VIEW COMPARISON:  CT 06/02/2020, radiograph 05/25/2020 FINDINGS: There are persistent heterogeneous airspace and interstitial opacities throughout both lungs with a mid to lower lung predominance, slightly improved from comparison imaging. Cardiac silhouette is similar to prior accounting for slight patient rotation. No pneumothorax or visible effusion. No acute osseous or soft tissue abnormality. IMPRESSION: Persistent albeit slightly improved heterogeneous airspace and interstitial opacities throughout both lungs. Electronically Signed   By: Kreg Shropshire M.D.   On: 06/17/2020 03:44   Recent Labs    06/17/20 0118  WBC 11.4*  HGB 11.4*  HCT 36.1*  PLT 284   Recent Labs    06/17/20 0118  NA 133*  K 3.5  CL 96*  CO2 28  GLUCOSE 149*  BUN 10  CREATININE 0.64  CALCIUM 8.7*    Intake/Output Summary (Last 24 hours) at 06/17/2020 1208 Last data filed at 06/17/2020 1125 Gross per 24 hour  Intake 876 ml  Output 1951 ml  Net -1075 ml     Pressure Injury 06/08/20 Coccyx Unstageable - Full thickness tissue loss in which the base of the injury is covered by slough (yellow, tan, gray, green or brown) and/or eschar (tan, brown or black) in the wound bed. yellow necrotic tissue in the crease o (Active)  06/08/20 1830 (with Collin Leyden RN)  Location: Coccyx  Location Orientation:   Staging: Unstageable - Full thickness tissue loss in which the base of the  injury is covered by slough (yellow, tan, gray, green or brown) and/or eschar (tan, brown or black) in the wound bed.  Wound Description (Comments): yellow necrotic tissue in the crease originally put in as a nonpressure wound  Present on Admission: Yes    Physical Exam: Vital Signs Blood pressure 122/73, pulse (!) 119, temperature 99.7 F (37.6 C), resp. rate (!) 23, height 5\' 10"  (1.778 m), weight (!) 160.8 kg, SpO2 95 %.  General: Alert and oriented x 3, +malaise HEENT: Head is normocephalic, Falkner in place Neck: Supple without JVD or lymphadenopathy Heart: Irregular rhythm, tachycardic. No murmurs rubs or gallops Chest: +Tachypnea, coarse breath sounds Abdomen: Soft, with fouls smelling mushy BM Extremities: No clubbing, cyanosis, or edema. Pulses are 2+  Psych: pleasant and cooperative Skin: eschar on toes, stable. 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 Cranial nerves 2-12 are intact. DTR's 1+.  Fine motor coordination is intact. No tremors. Motor function is grossly 5/5 RUE. LUE weak in ABD/EE,WE, decreased sensation along lateral wrist and arm.  Musculoskeletal: Full ROM, No pain with AROM or PROM in the neck, trunk, or LE's.  Posture appropriate       Assessment/Plan: 1. 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  Physiatrist is providing close team supervision and 24 hour management of active medical problems listed below.  Physiatrist and rehab team continue to assess  barriers to discharge/monitor patient progress toward functional and medical goals  Care Tool:  Bathing    Body parts bathed by patient: Chest, Abdomen, Front perineal area, Right upper leg, Left upper leg, Face   Body parts bathed by helper: Right arm,  Left arm, Buttocks, Left lower leg, Right lower leg     Bathing assist Assist Level: 2 Helpers     Upper Body Dressing/Undressing Upper body dressing   What is the patient wearing?: Hospital gown only    Upper body assist Assist Level: Moderate Assistance - Patient 50 - 74%    Lower Body Dressing/Undressing Lower body dressing      What is the patient wearing?: Incontinence brief     Lower body assist Assist for lower body dressing: 2 Helpers     Toileting Toileting    Toileting assist Assist for toileting: Minimal Assistance - Patient > 75% (Assist the pt with the urinal)     Transfers Chair/bed transfer  Transfers assist  Chair/bed transfer activity did not occur: Safety/medical concerns  Chair/bed transfer assist level: Moderate Assistance - Patient 50 - 74% Chair/bed transfer assistive device: Armrests   Locomotion Ambulation   Ambulation assist   Ambulation activity did not occur: Safety/medical concerns  Assist level: 2 helpers (min/mod A of 1 and +2 w/c follow & O2 line management) Assistive device: Walker-Eva Max distance: 12ft   Walk 10 feet activity   Assist  Walk 10 feet activity did not occur: Safety/medical concerns  Assist level: 2 helpers (min/mod A of 1 and +2 w/c follow & O2 line management) Assistive device: Walker-Eva   Walk 50 feet activity   Assist Walk 50 feet with 2 turns activity did not occur: Safety/medical concerns (did not perform turns)  Assist level: 2 helpers Assistive device: Walker-Eva    Walk 150 feet activity   Assist Walk 150 feet activity did not occur: Safety/medical concerns         Walk 10 feet on uneven surface  activity   Assist Walk 10 feet on uneven surfaces activity did not occur: Safety/medical concerns         Wheelchair     Assist Will patient use wheelchair at discharge?: No   Wheelchair activity did not occur: Safety/medical concerns         Wheelchair 50 feet with 2  turns activity    Assist    Wheelchair 50 feet with 2 turns activity did not occur: Safety/medical concerns       Wheelchair 150 feet activity     Assist  Wheelchair 150 feet activity did not occur: Safety/medical concerns       Blood pressure 122/73, pulse (!) 119, temperature 99.7 F (37.6 C), resp. rate (!) 23, height 5\' 10"  (1.778 m), weight (!) 160.8 kg, SpO2 95 %.  Medical Problem List and Plan: 1.  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             -Hold therapy 11/6-11/7 given sepsis             Weaning supplemental oxygen as tolerated--making progress with this 2.  Antithrombotics: -DVT/anticoagulation: Lovenox 85 mg daily.               -  antiplatelet therapy: Aspirin 81 mg daily 3. Pain Management: Advil as needed             Oxycodone as needed             11/5: pain improved with 400mg  tid of gabapentin  11/6-7: stable 4. Mood: Provide emotional support             -antipsychotic agents: N/A 5. Neuropsych: This patient is capable of making decisions on his own behalf. 6. 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 Pressure relief, nutrition 7. Fluids/Electrolytes/Nutrition: Routine in and outs.  CMP ordered. 8.  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  Patient completed 5-day course Solu-Medrol             Supportive care with range of motion             Recommend NCV/EMG as outpatient.  -increased gabapentin as above 9.  New onset diabetes mellitus, hemoglobin A1c 7.2.  NovoLog 4 units 3 times daily, Levemir 15 units twice daily.  Provide diabetic teaching             CBG (last 3)  Recent Labs    06/16/20 1957 06/17/20 0555 06/17/20 1124  GLUCAP  139* 121* 137*   -11/5: CBGs well controlled 10.  Hypertension with tachycardia.  Lopressor 50 mg twice daily.               10/31 pt hypotensive yesterday with OT which wasn't unexpected   -TEDS, Abd binder   -push po fluids   -continue to acclimate, get OOB   -reassured him that this is to be expected   -continue to monitor  11/3: Increased Lopressor to 75mg  BID  11/5 bp relatively controlled today 11.  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  Dietary follow-up 12. Sepsis score 12, Mews 13: nausea, loose stools, lower abdominal pain, mild fever, fatigue: hold therapy 11/6-11/7. Zofran and Compazine ordered, IV zosyn and Vanc ordered for fever up to 102, improved to 99.7. UA ordered and is positive. CBC ordered with elevated neutrophils and WBC. Medicine consulted. CXR shows bilateral atelectasis and opacities. Blood cultures x2 drawn before abx started and are pending. EKG shows sinus tachycardia, otherwise normal.   >35 minutes spent in examination of patient, discussing with nursing staff and consulting medicine, interpreting UA, CBC, checking BC, ordering and interpreting EKG, assessing vitals regularly, ordered C diff given symptoms and history of recent abx use prior to admission, updating his mother    LOS: 9 days A FACE TO FACE EVALUATION WAS PERFORMED  P Stacee Earp 06/17/2020, 12:08 PM

## 2020-06-17 NOTE — Consult Note (Signed)
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  He did well last week but started feeling ill Friday.  Loose stools, fever to 102, +UA.  Started Zosyn, Vanc with resolution of fever but persistent tachycardia/tachypnea.  Nausea yesterday.  Would like consult to make sure all bases are covered.   History of Present Illness: Collin Brown is an 34 y.o. male who was admitted at [name] from 9/23-10/29 with acute respiratory failure associated with PNA due to COVID-19 and ARDS.  He also has h/o DM; OSA/OHS; and morbid obesity (BMI 50.87 as of today, down from 52.73 on admission).  While hospitalized he developed LUE weakness which was thought to be a brachial plexus injury and so was transferred to CIR at [facility].  He reports acute onset of illness on Friday with abdominal pain and 5 loose stools.  He had generalized fatigue.  He developed fever and chills; fever has improved but chills are ongoing.  He had 2 diarrheal stools this AM.  No SOB and minimal and unchanged cough.  No urinary symptoms.   Review of Systems:  ROS As per HPI otherwise 10 point review of systems negative.    Past Medical History: Past Medical History:  Diagnosis Date  . Critical illness myopathy   . Diabetes mellitus without complication (HCC)   . Hearing loss in right ear   . Hypertension   . Sleep apnea     Past Surgical History: Past Surgical History:  Procedure Laterality Date  . TONSILLECTOMY       Allergies:  No Known Allergies   Social History:  reports that he has never smoked. He has never used smokeless tobacco. He reports that he does not drink alcohol and does not use drugs.   Family History: Family History  Problem Relation Age of Onset  . Diabetes Mellitus II Mother    . Hypertension Mother   . Diabetes Mellitus II Father   . High blood pressure Father       Physical Exam: Vitals:   06/17/20 0500 06/17/20 0749 06/17/20 1131 06/17/20 1513  BP:  129/76 122/73 124/70  Pulse:  (!) 110 (!) 119 (!) 102  Resp:  20 (!) 23 19  Temp:  99.7 F (37.6 C) 99.7 F (37.6 C) 99.2 F (37.3 C)  TempSrc:  Axillary    SpO2:  97% 95% 95%  Weight: (!) 160.8 kg     Height:        Constitutional: Alert and awake, oriented x3, not in any acute distress.  Appears mildly to moderately ill. Eyes:  EOMI, irises appear normal, anicteric sclera,  ENMT: external ears and nose appear normal, normal hearing, Lips appear normal, oropharynx mucosa, tongue appear normal  Neck: neck appears normal, no masses, normal ROM  CVS: NSR with mild tachycardia, no murmur rubs or gallops, no LE edema Respiratory:  clear to auscultation bilaterally, no wheezing, rales or rhonchi. Respiratory effort normal to mildly increased. No accessory muscle use.  Abdomen: soft TTP in BUQs, nondistended Musculoskeletal: : no cyanosis, clubbing or edema noted bilaterally; able to wiggle L fingers with otherwise very limited LUE movement Neuro: Cranial nerves II-XII intact Psych: judgement and insight appear blunted with flat mood and  affect, normal mental status Skin: no rashes or lesions or ulcers, no induration or nodules    Data reviewed:  I have personally reviewed the recent labs and imaging studies  Pertinent Labs:   Glucose 149 WBC 11.4  Hgb 11.4 UA: small LE, +nitrite, many bacteria Blood cultures pending   Inpatient Medications:   Scheduled Meds: . vitamin C  500 mg Oral Daily  . aspirin  81 mg Oral Daily  . collagenase   Topical Daily  . enoxaparin (LOVENOX) injection  0.5 mg/kg Subcutaneous Q24H  . feeding supplement (NEPRO CARB STEADY)  237 mL Oral TID BM  . gabapentin  400 mg Oral TID  . insulin aspart  0-20 Units Subcutaneous TID WC  . insulin aspart  4 Units Subcutaneous  TID WC  . insulin detemir  15 Units Subcutaneous BID  . metoprolol tartrate  75 mg Oral BID  . multivitamin with minerals  1 tablet Oral Daily  . polyethylene glycol  17 g Oral BID  . senna  1 tablet Oral BID   Continuous Infusions: . piperacillin-tazobactam (ZOSYN)  IV 3.375 g (06/17/20 1407)  . vancomycin 1,250 mg (06/17/20 1654)     Radiological Exams on Admission: DG CHEST PORT 1 VIEW  Result Date: 06/17/2020 CLINICAL DATA:  Fever EXAM: PORTABLE CHEST 1 VIEW COMPARISON:  CT 06/02/2020, radiograph 05/25/2020 FINDINGS: There are persistent heterogeneous airspace and interstitial opacities throughout both lungs with a mid to lower lung predominance, slightly improved from comparison imaging. Cardiac silhouette is similar to prior accounting for slight patient rotation. No pneumothorax or visible effusion. No acute osseous or soft tissue abnormality. IMPRESSION: Persistent albeit slightly improved heterogeneous airspace and interstitial opacities throughout both lungs. Electronically Signed   By: Kreg Shropshire M.D.   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   -SIRS criteria in this patient includes: Leukocytosis, fever, tachycardia, tachypnea   -Patient has no apparent evidence of acute organ failure; lactate is pending -While awaiting blood cultures, this appears to be a preseptic condition. -Suspected source is UTI and/or diarrheal illness. -UA is abnormal, but he has no symptoms; culture is pending. -He reports diarrhea; C diff is negative and GI pathogen panel is pending. -Pulmonary infection is a definite consideration - particularly in light of his recent severe COVID-associated lung infection.  However, he denies a change in his breathing or cough and CXR is not diagnostic.  If no other source is identified and/or symptoms worsen, consider chest CT. -Blood and urine cultures pending -Treat with IV Vanc/Zosyn for undifferentiated sepsis -Will order lower respiratory tract procalcitonin level.  Antibiotics would not be indicated for PCT <0.1 and probably should not be used for < 0.25.  >0.5 indicates infection and >>0.5 indicates more serious disease.  As the procalcitonin level normalizes, it will be reasonable to consider de-escalation of antibiotic coverage.  HTN -Continue Lopressor  DM -A1c was 7.2 during hospitalization -Started on Levemir 15 units daily -Cover with resistant-scale SSI  HLD -He is recommended to start statin therapy when his myopathy is improved  OHS/OSA -Previously on CPAP at home -Pulmonology recommended qhs BIPAP while hospitalized and after d/c -Will need outpatient pulmonology f/u at the time of d/c  Morbid obesity -BMI 50.87 (down significantly since admission) -Weight loss should be encouraged -Outpatient PCP/bariatric medicine/bariatric surgery f/u encouraged    Thank you for this consultation.  Our [facility] hospitalist team will follow the patient with you.   Time Spent: 50 minutes  Jonah Blue M.D. [facility] 06/17/2020, 6:17 PM

## 2020-06-17 NOTE — Progress Notes (Signed)
Patient running temp of 102.6, MEWS now red. MD Raulkar was called and made aware. New orders in place per MD. Call bell in reach.

## 2020-06-18 ENCOUNTER — Inpatient Hospital Stay (HOSPITAL_COMMUNITY): Payer: BC Managed Care – PPO

## 2020-06-18 DIAGNOSIS — N390 Urinary tract infection, site not specified: Secondary | ICD-10-CM | POA: Diagnosis not present

## 2020-06-18 DIAGNOSIS — G7281 Critical illness myopathy: Secondary | ICD-10-CM | POA: Diagnosis not present

## 2020-06-18 DIAGNOSIS — S143XXS Injury of brachial plexus, sequela: Secondary | ICD-10-CM | POA: Diagnosis not present

## 2020-06-18 DIAGNOSIS — A499 Bacterial infection, unspecified: Secondary | ICD-10-CM

## 2020-06-18 DIAGNOSIS — E1169 Type 2 diabetes mellitus with other specified complication: Secondary | ICD-10-CM | POA: Diagnosis not present

## 2020-06-18 LAB — GASTROINTESTINAL PANEL BY PCR, STOOL (REPLACES STOOL CULTURE)

## 2020-06-18 LAB — GLUCOSE, CAPILLARY
Glucose-Capillary: 114 mg/dL — ABNORMAL HIGH (ref 70–99)
Glucose-Capillary: 100 mg/dL — ABNORMAL HIGH (ref 70–99)
Glucose-Capillary: 93 mg/dL (ref 70–99)
Glucose-Capillary: 96 mg/dL (ref 70–99)

## 2020-06-18 LAB — PROCALCITONIN: Procalcitonin: 0.3 ng/mL

## 2020-06-18 MED ORDER — AMOXICILLIN-POT CLAVULANATE 875-125 MG PO TABS
1.0000 | ORAL_TABLET | Freq: Two times a day (BID) | ORAL | Status: AC
  Administered 2020-06-18 – 2020-06-21 (×6): 1 via ORAL
  Filled 2020-06-18 (×6): qty 1

## 2020-06-18 NOTE — Progress Notes (Signed)
PROGRESS NOTE  Collin Brown  DOB: 03-03-1986  PCP: Patient, No Pcp Per LZJ:[id number]  DOA: 06/08/2020  LOS: 10 days   Chief complaint: Medical consultation for fever  Brief narrative: Collin Brown is an 34 y.o. male  with history of morbid obesity, OSA, OHS, DM who was hospitalized at [facility] (9/23-10/29) with acute respiratory failure associated with COVID-19 pneumonia and ARDS. While hospitalized he developed LUE weakness which was thought to be a brachial plexus injury.  He also had generalized weakness due to critical illness myopathy He has been recovering at [facility] since discharge from [facility].   11/5, patient started having abdominal pain, loose stool, fever and worsening generalized weakness. Fever was noted of up to 102.   Urinalysis 11/7 showed a hazy yellow urine with positive leukocytes and many bacteria. Chest x-ray showed persistent albeit slightly improved heterogeneous airspace and interstitial opacities throughout both lungs. Stool studies with C. difficile negative.  Subjective: Patient was seen and examined this morning Pleasant, young, morbidly obese Caucasian male. Lying down in bed.  Not in distress.  Shallow breathing.  Remains on 3 L oxygen by nasal cannula.  No fever last 24 hours.   Assessment/Plan: Sepsis secondary to UTI Gram-negative rod and urinalysis -Fever, leukocytosis, positive urinalysis.  Gram-negative rod in preliminary urine culture report. -Other potential causes of infection were looked for as well. -Blood culture pending.  C. difficile negative and stool assay.  Chest x-ray does not show any worsening finding. -Currently on IV vancomycin and Zosyn. -Fever free for last 26 hours -I will switch him to oral Augmentin  COVID long-hauler with critical illness myopathy, debility, and brachial plexopathy -Patient was previously hospitalized for a long time due to complications of COVID-19 -He was transferred to rehab and has been showing  improvement in therapy -Continue incentive spirometry and other supportive cares.  HTN -Continue Lopressor  DM -A1c was 7.2 during hospitalization -Started on Levemir 15 units daily -Cover with resistant-scale SSI  HLD -He is recommended to start statin therapy when his myopathy is improved  OHS/OSA -Previously on CPAP at home -Pulmonology recommended qhs BIPAP while hospitalized and after d/c -Will need outpatient pulmonology f/u at the time of d/c  Morbid obesity -BMI 50.87 (down significantly since admission) -Weight loss should be encouraged -Outpatient PCP/bariatric medicine/bariatric surgery f/u encouraged   Mobility: Continue to work with therapy Code Status:   Code Status: Full Code  Nutritional status: Body mass index is 50.58 kg/m.     Diet Order            Diet Heart Room service appropriate? Yes; Fluid consistency: Thin  Diet effective now                 DVT prophylaxis: Place TED hose Start: 06/08/20 1215   Antimicrobials:  Oral Augmentin Fluid: None Family Communication:  Not at bedside   Infusions:    Scheduled Meds: . amoxicillin-clavulanate  1 tablet Oral Q12H  . vitamin C  500 mg Oral Daily  . aspirin  81 mg Oral Daily  . collagenase   Topical Daily  . enoxaparin (LOVENOX) injection  0.5 mg/kg Subcutaneous Q24H  . feeding supplement (NEPRO CARB STEADY)  237 mL Oral TID BM  . gabapentin  400 mg Oral TID  . insulin aspart  0-20 Units Subcutaneous TID WC  . insulin aspart  4 Units Subcutaneous TID WC  . insulin detemir  15 Units Subcutaneous BID  . metoprolol tartrate  75 mg Oral BID  .  multivitamin with minerals  1 tablet Oral Daily    Antimicrobials: Anti-infectives (From admission, onward)   Start     Dose/Rate Route Frequency Ordered Stop   06/18/20 2000  amoxicillin-clavulanate (AUGMENTIN) 875-125 MG per tablet 1 tablet        1 tablet Oral Every 12 hours 06/18/20 1346     06/17/20 2359  piperacillin-tazobactam (ZOSYN)  IVPB 3.375 g  Status:  Discontinued        3.375 g 12.5 mL/hr over 240 Minutes Intravenous Every 8 hours 06/16/20 2353 06/17/20 0013   06/17/20 0800  vancomycin (VANCOREADY) IVPB 1250 mg/250 mL  Status:  Discontinued        1,250 mg 166.7 mL/hr over 90 Minutes Intravenous Every 8 hours 06/17/20 0258 06/18/20 1346   06/17/20 0030  vancomycin (VANCOREADY) IVPB 2000 mg/400 mL        2,000 mg 200 mL/hr over 120 Minutes Intravenous  Once 06/16/20 2353 06/17/20 0435   06/17/20 0030  piperacillin-tazobactam (ZOSYN) IVPB 3.375 g  Status:  Discontinued        3.375 g 12.5 mL/hr over 240 Minutes Intravenous Every 8 hours 06/17/20 0013 06/18/20 1346      PRN meds: acetaminophen, ondansetron, oxyCODONE, prochlorperazine   Objective: Vitals:   06/18/20 1030 06/18/20 1308  BP: 120/75 (!) 121/51  Pulse: 91 70  Resp: 18 20  Temp: (!) 97 F (36.1 C) 98.1 F (36.7 C)  SpO2: 96% 95%    Intake/Output Summary (Last 24 hours) at 06/18/2020 1346 Last data filed at 06/18/2020 0850 Gross per 24 hour  Intake 959 ml  Output 1000 ml  Net -41 ml   Filed Weights   06/16/20 0500 06/17/20 0500 06/18/20 0436  Weight: (!) 160.4 kg (!) 160.8 kg (!) 159.9 kg   Weight change: -0.9 kg Body mass index is 50.58 kg/m.   Physical Exam:  General exam: Morbidly obese young Caucasian male.   Skin: No rashes, lesions or ulcers. HEENT: Atraumatic, normocephalic, supple neck, no obvious bleeding Lungs: Diminished air entry in both bases. CVS: Regular rate and rhythm, no murmur GI/Abd soft, distended obesity, nontender, CNS: Alert, awake, oriented x3 Psychiatry: Depressed look Extremities: No pedal edema, no calf tenderness  Data Review: I have personally reviewed the laboratory data and studies available.  Recent Labs  Lab 06/17/20 0118  WBC 11.4*  NEUTROABS 9.3*  HGB 11.4*  HCT 36.1*  MCV 89.8  PLT 284   Recent Labs  Lab 06/17/20 0118  NA 133*  K 3.5  CL 96*  CO2 28  GLUCOSE 149*  BUN 10   CREATININE 0.64  CALCIUM 8.7*    F/u labs ordered.  Signed, Lorin Glass, MD [facility] 06/18/2020

## 2020-06-18 NOTE — Progress Notes (Signed)
Physical Therapy Weekly Progress Note  Patient Details  Name: Collin Brown MRN: [id number] Date of Birth: 19-Oct-1985  Beginning of progress report period: June 09, 2020 End of progress report period: June 18, 2020  Today's Date: 06/18/2020 PT Individual Time: 1330-1415 PT Individual Time Calculation (min): 45 min   Patient has met 4 of 4 short term goals.  Patient with excellent progress this week, however, limited by high sepsis score over the weekend with fever, loose BMs, and severe nausea. He performs bed mobility with min A-supervision with use of bed rails, stand pivot transfers with mod-min A without AD, sit to stand with min A-CGA with R rail, ambulated up to 70 ft using Eva/Swedish walker with min-mod A and w/c follow due to decreased activity tolerance. He has demonstrated some return of motor function of his L upper extremity with limited progress with shoulder flexion and elbow flexion.  Patient continues to demonstrate the following deficits muscle weakness and muscle joint tightness, decreased cardiorespiratoy endurance and decreased oxygen support, abnormal tone and unbalanced muscle activation and decreased sitting balance, decreased standing balance, decreased postural control and decreased balance strategies and therefore will continue to benefit from skilled PT intervention to increase functional independence with mobility.  Patient progressing toward long term goals..  Plan of care revisions: upgraded LTGs to supervision overall due to patient's progress with functional mobility and activity tolerance, see care plan for details.  PT Short Term Goals Week 1:  PT Short Term Goal 1 (Week 1): Pt will perform bed mobility with maxA +1 person PT Short Term Goal 1 - Progress (Week 1): Met PT Short Term Goal 2 (Week 1): Pt will perform bed<>chair transfers with maxA and LRAD PT Short Term Goal 2 - Progress (Week 1): Met PT Short Term Goal 3 (Week 1): Pt will tolerate sitting  in chair/recliner for >1 hour with stable vital signs PT Short Term Goal 3 - Progress (Week 1): Met PT Short Term Goal 4 (Week 1): Pt will begin initiating pre-gait training with maxA and LRAD PT Short Term Goal 4 - Progress (Week 1): Met Week 2:  PT Short Term Goal 1 (Week 2): Patient will perform bed mobility with CGA without use of hospital bed functions. PT Short Term Goal 2 (Week 2): Patient will perform basic transfers with min A consitently. PT Short Term Goal 3 (Week 2): Patient will ambulate 100 ft using LRAD with mod A.  Skilled Therapeutic Interventions/Progress Updates:     Patient in bed with RN in the room upon PT arrival. Patient reporting severe, 8/10 nausea and generally feeling "ill." Participated in bed level exercises and discussed patient's progress, POC, and upgraded LTGs with patient during session. Patient eager to return to progressing with functional mobility and very pleased with his progress over this last week.   Therapeutic Exercise: Patient performed the following exercises with verbal and tactile cues for proper technique. -PROM wrist, elbow, shoulder flexion/extension and shoulder abduction/adduction and IR/ER with 5-10 sec hold at end range for stretch x10 each; limited shoulder flexion and abduction </= 90 degrees with scapula supported to limit stress on the [facility] joint -B ankle pumps x20 -B heel slides x10 -B glut sets x10 with 5 sec hold -B quad sets x10 with 5 sec hold -unable to bridge due to increased nausea  Patient with increased nausea and pain, requesting to rest at this time. RN made aware. Patient missed 15 min of skilled PT due to nausea/pain, RN made aware. Will  attempt to make-up missed time as able.    Patient in bed at end of session with breaks locked, bed alarm set, and all needs within reach.   Therapy Documentation Precautions:  Precautions Precautions: Fall Precaution Comments: flaccid LUE (protect from subluxation); sling when OOB  for comfort; skin integrity (sacral wound); monitor vitals Required Braces or Orthoses: Sling Restrictions Weight Bearing Restrictions: No General: PT Amount of Missed Time (min): 15 Minutes PT Missed Treatment Reason: Pain;Patient ill (Comment) (nausea)   Therapy/Group: Individual Therapy  Dhruva Orndoff L Seva Chancy PT, DPT  06/18/2020, 2:31 PM

## 2020-06-18 NOTE — Plan of Care (Signed)
  Problem: RH Balance Goal: LTG Patient will maintain dynamic standing balance (PT) Description: LTG:  Patient will maintain dynamic standing balance with assistance during mobility activities (PT) Flowsheets (Taken 06/18/2020 1406) LTG: Pt will maintain dynamic standing balance during mobility activities with:: (Upgraded goal due to patient's progress with activity tolerance and balance with functional mobility.) Supervision/Verbal cueing Note: Upgraded goal due to patient's progress with activity tolerance and balance with functional mobility.   Problem: Sit to Stand Goal: LTG:  Patient will perform sit to stand with assistance level (PT) Description: LTG:  Patient will perform sit to stand with assistance level (PT) Flowsheets (Taken 06/18/2020 1406) LTG: PT will perform sit to stand in preparation for functional mobility with assistance level: (Upgraded goal due to patient's progress with activity tolerance and balance with functional mobility.) Supervision/Verbal cueing Note: Upgraded goal due to patient's progress with activity tolerance and balance with functional mobility.   Problem: RH Bed Mobility Goal: LTG Patient will perform bed mobility with assist (PT) Description: LTG: Patient will perform bed mobility with assistance, with/without cues (PT). Flowsheets (Taken 06/18/2020 1406) LTG: Pt will perform bed mobility with assistance level of: Supervision/Verbal cueing   Problem: RH Ambulation Goal: LTG Patient will ambulate in controlled environment (PT) Description: LTG: Patient will ambulate in a controlled environment, # of feet with assistance (PT). Flowsheets (Taken 06/18/2020 1406) LTG: Pt will ambulate in controlled environ  assist needed:: Contact Guard/Touching assist LTG: Ambulation distance in controlled environment: (Upgraded goal due to patient's progress with activity tolerance and balance with functional mobility.) 150 ft Note: Upgraded goal due to patient's progress  with activity tolerance and balance with functional mobility. Goal: LTG Patient will ambulate in home environment (PT) Description: LTG: Patient will ambulate in home environment, # of feet with assistance (PT). Flowsheets (Taken 06/18/2020 1406) LTG: Pt will ambulate in home environ  assist needed:: (Upgraded goal due to patient's progress with activity tolerance and balance with functional mobility.) Contact Guard/Touching assist LTG: Ambulation distance in home environment: 50 ft using LRAD Note: Upgraded goal due to patient's progress with activity tolerance and balance with functional mobility.

## 2020-06-18 NOTE — Progress Notes (Signed)
Occupational Therapy Weekly Progress Note  Patient Details  Name: Collin Brown MRN: [id number] Date of Birth: 04-01-1986  Beginning of progress report period: June 09, 2020 End of progress report period: June 18, 2020  Today's Date: 06/18/2020 OT Individual Time: 1700-1749 OT Individual Time Calculation (min): 25 min  and Today's Date: 06/18/2020 OT Missed Time: 50 Minutes Missed Time Reason: Patient ill (comment)   Patient has met 3 of 4 short term goals. Pt has made excellent progress over the last week in therapy. He progressed to min-mod A of one for sit <> stand transfers and stand pivots. He fluctuated with fatigue but had already made significant progress. He completed UB dressing with mod A and and LB dressing with max A. Unfortunately during the last 3 days Collin Brown has had a medical decline with fevers, malaise, and nausea. The team is awaiting further decisions re medical treatment.   Patient continues to demonstrate the following deficits: muscle weakness, decreased cardiorespiratoy endurance and decreased oxygen support and decreased sitting balance, decreased standing balance, decreased postural control and L upper extremity weakness and therefore will continue to benefit from skilled OT intervention to enhance overall performance with BADL, iADL and Reduce care partner burden.  Patient progressing toward long term goals..  Continue plan of care.  OT Short Term Goals Week 1:  OT Short Term Goal 1 (Week 1): Pt will complete sit<>stand with mod assist +2 in preperation for LB ADLs OT Short Term Goal 1 - Progress (Week 1): Met OT Short Term Goal 2 (Week 1): Pt will achieve full PROM elbow flexion with minimal to no pain. OT Short Term Goal 2 - Progress (Week 1): Progressing toward goal OT Short Term Goal 3 (Week 1): Pt will complete UB dressing with mod assist. OT Short Term Goal 3 - Progress (Week 1): Met OT Short Term Goal 4 (Week 1): Pt will complete UB bathing using long  handled sponge as needed with min assist. OT Short Term Goal 4 - Progress (Week 1): Met Week 2:  OT Short Term Goal 1 (Week 2): Pt will don shirt with min A OT Short Term Goal 2 (Week 2): Pt will complete stand pivot transfer to the [facility] with min A OT Short Term Goal 3 (Week 2): Pt will don LB clothing with mod A and LRAD  Skilled Therapeutic Interventions/Progress Updates:    Checked in with PA re medical stability and appropriateness for therapy. Session approved within pt's comfort/ability level. Upon arriving in room pt was on the bedpan and feeling "horrible" with obvious pallor, generalized weakness, and pt reporting nausea. He rolled to the right with min A for removal of bedpan and max A for peri hygiene. Pt required mod A for rolling to the R to don new brief. Pt was encouraged to attempt and eat a small snack to maintain blood sugar levels. He was able to scoot himself up in bed with bed in trend position. He consumed two packs of saltines and half of a ginger ale. Pt feeling too ill to continue with any therapy. 50 min missed.   Therapy Documentation Precautions:  Precautions Precautions: Fall Precaution Comments: flaccid LUE (protect from subluxation); sling when OOB for comfort; skin integrity (sacral wound); monitor vitals Required Braces or Orthoses: Sling Restrictions Weight Bearing Restrictions: No General: General OT Amount of Missed Time: 50 Minutes   Therapy/Group: Individual Therapy  Curtis Sites 06/18/2020, 12:37 PM

## 2020-06-18 NOTE — Progress Notes (Signed)
Physical Therapy Session Note  Patient Details  Name: NELVIN TOMB MRN: [id number] Date of Birth: Jan 16, 1986  Today's Date: 06/18/2020 PT Missed Time: 30 Minutes Missed Time Reason: Pain;Patient ill (Comment)  Short Term Goals: Week 2:  PT Short Term Goal 1 (Week 2): Patient will perform bed mobility with CGA without use of hospital bed functions. PT Short Term Goal 2 (Week 2): Patient will perform basic transfers with min A consitently. PT Short Term Goal 3 (Week 2): Patient will ambulate 100 ft using LRAD with mod A.  Skilled Therapeutic Interventions/Progress Updates:     Pt requesting to rest due to ongoing issues with nausea, fever, and general malaise. PT will follow up as appropriate.  Therapy Documentation Precautions:  Precautions Precautions: Fall Precaution Comments: flaccid LUE (protect from subluxation); sling when OOB for comfort; skin integrity (sacral wound); monitor vitals Required Braces or Orthoses: Sling Restrictions Weight Bearing Restrictions: No   Therapy/Group: Individual Therapy  Beau Fanny, PT, DPT 06/18/2020, 4:25 PM

## 2020-06-18 NOTE — Progress Notes (Addendum)
PHYSICAL MEDICINE & REHABILITATION PROGRESS NOTE   Subjective/Complaints: Still doesn't feel all that well. Doesn't like liquid diet. No changes in breathing.   ROS: Patient denies fever, rash, sore throat, blurred vision, nausea, vomiting,   cough, shortness of breath or chest pain,   headache, or mood change.    Objective:   DG CHEST PORT 1 VIEW  Result Date: 06/17/2020 CLINICAL DATA:  Fever EXAM: PORTABLE CHEST 1 VIEW COMPARISON:  CT 06/02/2020, radiograph 05/25/2020 FINDINGS: There are persistent heterogeneous airspace and interstitial opacities throughout both lungs with a mid to lower lung predominance, slightly improved from comparison imaging. Cardiac silhouette is similar to prior accounting for slight patient rotation. No pneumothorax or visible effusion. No acute osseous or soft tissue abnormality. IMPRESSION: Persistent albeit slightly improved heterogeneous airspace and interstitial opacities throughout both lungs. Electronically Signed   By: Kreg Shropshire M.D.   On: 06/17/2020 03:44   Recent Labs    06/17/20 0118  WBC 11.4*  HGB 11.4*  HCT 36.1*  PLT 284   Recent Labs    06/17/20 0118  NA 133*  K 3.5  CL 96*  CO2 28  GLUCOSE 149*  BUN 10  CREATININE 0.64  CALCIUM 8.7*    Intake/Output Summary (Last 24 hours) at 06/18/2020 1116 Last data filed at 06/18/2020 0850 Gross per 24 hour  Intake 959 ml  Output 1400 ml  Net -441 ml     Pressure Injury 06/08/20 Coccyx Unstageable - Full thickness tissue loss in which the base of the injury is covered by slough (yellow, tan, gray, green or brown) and/or eschar (tan, brown or black) in the wound bed. yellow necrotic tissue in the crease o (Active)  06/08/20 1830 (with Lizabeth Leyden RN)  Location: Coccyx  Location Orientation:   Staging: Unstageable - Full thickness tissue loss in which the base of the injury is covered by slough (yellow, tan, gray, green or brown) and/or eschar (tan, brown or black) in the  wound bed.  Wound Description (Comments): yellow necrotic tissue in the crease originally put in as a nonpressure wound  Present on Admission: Yes    Physical Exam: Vital Signs Blood pressure 120/75, pulse 91, temperature (!) 97 F (36.1 C), temperature source Axillary, resp. rate 18, height 5\' 10"  (1.778 m), weight (!) 159.9 kg, SpO2 96 %.  Constitutional: Ill-appearing. Vital signs reviewed. HEENT: EOMI, oral membranes moist Neck: supple Cardiovascular: RRR without murmur. No JVD    Respiratory/Chest: CTA Bilaterally without wheezes or crackles. Normal effort    GI/Abdomen: BS +, non-tender, non-distended Ext: no clubbing, cyanosis, or edema Psych: pleasant and cooperative Skin: eschar on toes, stable. Sacral wound with   fibronecrotic tissue. Wound is a lot deeper and at least a stage III/IV Neuro: Pt is cognitively appropriate with normal insight, memory, and awareness. Cranial nerves 2-12 are intact. DTR's 1+.  Fine motor coordination is intact. No tremors. Motor function is grossly 5/5 RUE. LUE weak in ABD/EE,WE, no neuro changes Musculoskeletal: Full ROM, No pain with AROM or PROM in the neck, trunk, or LE's.  Posture appropriate       Assessment/Plan: 1. 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  Physiatrist is providing close team supervision and 24 hour management of active medical problems listed below.  Physiatrist and rehab team continue to assess barriers to discharge/monitor patient progress toward functional and medical goals  Care Tool:  Bathing  Body parts bathed by patient: Chest, Abdomen, Front perineal area, Right upper leg, Left upper leg, Face   Body parts bathed by helper: Right arm, Left arm, Buttocks, Left lower leg, Right lower leg     Bathing assist Assist Level: 2 Helpers     Upper Body Dressing/Undressing Upper body  dressing   What is the patient wearing?: Hospital gown only    Upper body assist Assist Level: Moderate Assistance - Patient 50 - 74%    Lower Body Dressing/Undressing Lower body dressing      What is the patient wearing?: Incontinence brief     Lower body assist Assist for lower body dressing: 2 Helpers     Toileting Toileting    Toileting assist Assist for toileting: Minimal Assistance - Patient > 75% (Assist the pt with the urinal)     Transfers Chair/bed transfer  Transfers assist  Chair/bed transfer activity did not occur: Safety/medical concerns  Chair/bed transfer assist level: Moderate Assistance - Patient 50 - 74% Chair/bed transfer assistive device: Armrests   Locomotion Ambulation   Ambulation assist   Ambulation activity did not occur: Safety/medical concerns  Assist level: 2 helpers (min/mod A of 1 and +2 w/c follow & O2 line management) Assistive device: Walker-Eva Max distance: 76ft   Walk 10 feet activity   Assist  Walk 10 feet activity did not occur: Safety/medical concerns  Assist level: 2 helpers (min/mod A of 1 and +2 w/c follow & O2 line management) Assistive device: Walker-Eva   Walk 50 feet activity   Assist Walk 50 feet with 2 turns activity did not occur: Safety/medical concerns (did not perform turns)  Assist level: 2 helpers Assistive device: Walker-Eva    Walk 150 feet activity   Assist Walk 150 feet activity did not occur: Safety/medical concerns         Walk 10 feet on uneven surface  activity   Assist Walk 10 feet on uneven surfaces activity did not occur: Safety/medical concerns         Wheelchair     Assist Will patient use wheelchair at discharge?: No   Wheelchair activity did not occur: Safety/medical concerns         Wheelchair 50 feet with 2 turns activity    Assist    Wheelchair 50 feet with 2 turns activity did not occur: Safety/medical concerns       Wheelchair 150 feet  activity     Assist  Wheelchair 150 feet activity did not occur: Safety/medical concerns       Blood pressure 120/75, pulse 91, temperature (!) 97 F (36.1 C), temperature source Axillary, resp. rate 18, height 5\' 10"  (1.778 m), weight (!) 159.9 kg, SpO2 96 %.  Medical Problem List and Plan: 1.  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             -can resume limited therapies as tolerated             Weaning supplemental oxygen as tolerated--making progress with this 2.  Antithrombotics: -DVT/anticoagulation: Lovenox 85 mg daily.               -antiplatelet therapy: Aspirin 81 mg daily 3. Pain Management: Advil as needed  Oxycodone as needed             11/5: pain improved with 400mg  tid of gabapentin  11/6-8: stable 4. Mood: Provide emotional support             -antipsychotic agents: N/A 5. Neuropsych: This patient is capable of making decisions on his own behalf. 6. 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 Appreciate help!  - routine skin checks. Pressure relief, nutrition 7. Fluids/Electrolytes/Nutrition: Routine in and outs.  CMP ordered. 8.  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  Patient completed 5-day course Solu-Medrol             Supportive care with range of motion             Recommend NCV/EMG as outpatient.  -increased gabapentin as above 9.  New onset diabetes mellitus, hemoglobin A1c 7.2.  NovoLog 4 units 3 times daily, Levemir 15 units twice daily.  Provide diabetic teaching             CBG (last 3)  Recent Labs    06/17/20 1709 06/17/20 2046 06/18/20 0642  GLUCAP 105* 118* 114*   -11/5: CBGs well controlled 10.  Hypertension with tachycardia.   Lopressor 50 mg twice daily.               10/31 pt hypotensive yesterday with OT which wasn't unexpected   -TEDS, Abd binder   -push po fluids   -continue to acclimate, get OOB   -reassured him that this is to be expected   -continue to monitor  11/3: Increased Lopressor to 75mg  BID  11/8 bp  controlled today 11.  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  Dietary follow-up 12. Fever, likely urosepsis. UCX with 100 GNR  -chest xray and exam unremarkable  -blood cultures pending  -continue vanc,zosyn, pending urine culture  -advance diet. He's hungry!  -appreciate family medicine assist!  -loose stool probably from multiple laxatives!!     LOS: 10 days A FACE TO FACE EVALUATION WAS PERFORMED  06/18/2020, 11:16 AM

## 2020-06-18 NOTE — Progress Notes (Signed)
Physical Therapy Wound Treatment Patient Details  Name: Collin Brown MRN: [id number] Date of Birth: 08-28-1985  Today's Date: 06/18/2020 PT Individual Time: 0939-1020 PT Individual Time Calculation (min): 41 min   Subjective  Subjective: Pt minimally conversant, agreeable to hydrotherapy Patient and Family Stated Goals: did not state Date of Onset:  (unknown ) Prior Treatments: dressing change  Pain Score:  0/10  Wound Assessment  Pressure Injury 06/08/20 Coccyx Unstageable - Full thickness tissue loss in which the base of the injury is covered by slough (yellow, tan, gray, green or brown) and/or eschar (tan, brown or black) in the wound bed. yellow necrotic tissue in the crease o (Active)  Wound Image   06/18/20 1303  Dressing Type ABD;Barrier Film (skin prep);Moist to dry;Gauze (Comment) 06/18/20 1303  Dressing Changed;Clean;Dry;Intact 06/18/20 1303  Dressing Change Frequency Daily 06/18/20 1303  State of Healing Eschar 06/18/20 1303  Site / Wound Assessment Black 06/18/20 1303  % Wound base Red or Granulating 0% 06/18/20 1303  % Wound base Yellow/Fibrinous Exudate 0% 06/18/20 1303  % Wound base Black/Eschar 100% 06/18/20 1303  % Wound base Other/Granulation Tissue (Comment) 0% 06/18/20 1303  Peri-wound Assessment Erythema (blanchable);Excoriated 06/18/20 1303  Wound Length (cm) 3 cm 06/18/20 1303  Wound Width (cm) 1 cm 06/18/20 1303  Wound Depth (cm) 5 cm 06/18/20 1303  Wound Surface Area (cm^2) 3 cm^2 06/18/20 1303  Wound Volume (cm^3) 15 cm^3 06/18/20 1303  Margins Unattached edges (unapproximated) 06/18/20 1303  Drainage Amount Minimal 06/18/20 1303  Drainage Description Serosanguineous 06/18/20 1303  Treatment Debridement (Selective);Hydrotherapy (Pulse lavage);Packing (Saline gauze) 06/18/20 1303   Santyl applied to wound bed prior to applying dressing.    Hydrotherapy Pulsed lavage therapy - wound location: sacrum Pulsed Lavage with Suction (psi): 12 psi Pulsed  Lavage with Suction - Normal Saline Used: 1000 mL Pulsed Lavage Tip: Tip with splash shield Selective Debridement Selective Debridement - Location: sacrum Selective Debridement - Tools Used: Scissors;Scalpel Selective Debridement - Tissue Removed: black eschar   Wound Assessment and Plan  Wound Therapy - Assess/Plan/Recommendations Wound Therapy - Clinical Statement: Pt presents to hydrotherapy with unstageable sacral pressure injury. Due to small opening, experienced difficulty with achieving appropriate position for sharp debridement. Will contact MD regarding potential surgery consult? Pt will benefit from further hydrotherapy for selective debridement and in order to remove nonviable tissue. Wound Therapy - Functional Problem List: decreased mobility Factors Delaying/Impairing Wound Healing: Immobility Hydrotherapy Plan: Debridement;Patient/family education;Pulsatile lavage with suction Wound Therapy - Frequency: 6X / week Wound Therapy - Follow Up Recommendations: Home health RN Wound Plan: see above  Wound Therapy Goals- Improve the function of patient's integumentary system by progressing the wound(s) through the phases of wound healing (inflammation - proliferation - remodeling) by: Decrease Necrotic Tissue to: 50 Decrease Necrotic Tissue - Progress: Progressing toward goal Increase Granulation Tissue to: 50 Increase Granulation Tissue - Progress: Progressing toward goal Goals/treatment plan/discharge plan were made with and agreed upon by patient/family: Yes Time For Goal Achievement: 7 days Wound Therapy - Potential for Goals: Good  Goals will be updated until maximal potential achieved or discharge criteria met.  Discharge criteria: when goals achieved, discharge from hospital, MD decision/surgical intervention, no progress towards goals, refusal/missing three consecutive treatments without notification or medical reason.  GP  Wyona Almas, PT, DPT Acute Rehabilitation  Services Pager [phone] Office [phone]      Deno Etienne 06/18/2020, 1:12 PM

## 2020-06-18 NOTE — Consult Note (Addendum)
WOC Nurse wound follow up Patient receiving care in [facility] 4W16.  Patient is having diarrhea and feeling nauseated, but no emesis at the time of my visit. 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 Periwound: intact Dressing procedure/placement/frequency: I see PT hydrotherapy was started today, which is exactly what I had in mind for him.  Continue hydrotherapy and santyl.  Right mandibular wound healed. Helmut Muster, RN, MSN, CWOCN, CNS-BC, pager [phone]

## 2020-06-19 ENCOUNTER — Inpatient Hospital Stay (HOSPITAL_COMMUNITY): Payer: BC Managed Care – PPO

## 2020-06-19 DIAGNOSIS — G7281 Critical illness myopathy: Secondary | ICD-10-CM | POA: Diagnosis not present

## 2020-06-19 DIAGNOSIS — S143XXS Injury of brachial plexus, sequela: Secondary | ICD-10-CM | POA: Diagnosis not present

## 2020-06-19 DIAGNOSIS — N390 Urinary tract infection, site not specified: Secondary | ICD-10-CM | POA: Diagnosis not present

## 2020-06-19 DIAGNOSIS — E1169 Type 2 diabetes mellitus with other specified complication: Secondary | ICD-10-CM | POA: Diagnosis not present

## 2020-06-19 LAB — URINE CULTURE
Culture: >=100000 — AB
Culture: NO GROWTH

## 2020-06-19 LAB — GLUCOSE, CAPILLARY
Glucose-Capillary: 91 mg/dL (ref 70–99)
Glucose-Capillary: 96 mg/dL (ref 70–99)
Glucose-Capillary: 101 mg/dL — ABNORMAL HIGH (ref 70–99)
Glucose-Capillary: 129 mg/dL — ABNORMAL HIGH (ref 70–99)

## 2020-06-19 LAB — PROCALCITONIN: Procalcitonin: 0.26 ng/mL

## 2020-06-19 NOTE — Progress Notes (Signed)
Physical Therapy Session Note  Patient Details  Name: Collin Brown MRN: [id number] Date of Birth: April 24, 1986  Today's Date: 06/19/2020 PT Individual Time: 1135-1203 and 1300-1400 PT Individual Time Calculation (min): 28 min and 60 min  Short Term Goals: Week 2:  PT Short Term Goal 1 (Week 2): Patient will perform bed mobility with CGA without use of hospital bed functions. PT Short Term Goal 2 (Week 2): Patient will perform basic transfers with min A consitently. PT Short Term Goal 3 (Week 2): Patient will ambulate 100 ft using LRAD with mod A.  Skilled Therapeutic Interventions/Progress Updates:     Session 1: Patient in bed upon PT arrival. Patient alert and agreeable to PT session. Patient reported 4/10 L upper extremity pain during session, RN made aware. PT provided repositioning, rest breaks, and distraction as pain interventions throughout session. Reported 5/10 nausea during session. Reported no change in nausea with mobility.   Therapeutic Activity: Bed Mobility: Donned B TED hose and pants with total A for energy conservation and time management. Performed bridging x2 to pull pants over patient's hips and rolling R with min A. Patient performed supine to sit with min A for trunk support and sit to supine with supervision with sue of bed rail. Provided verbal cues for management of L upper extremity with mobility and paced breathing for improved O2 support. Patient sat EOB x5 min with supervision for safety focused on sitting tolerance due to multiple days in the bed. Discussed d/c planning, patient reports that he sleeps in a low bed, plans to stay on the couch with recliner or lying options for improved standing and safety with mobility. Patient expressed frustration with limited mobility over the past few days, provided encouragement based on improved activity tolerance today. Also, discussed patient's fall over the weekend. Reinforced use of Stedy or Maxi-move for mobility with  nursing staff, per safety plan, RN and patient in agreement.   Patient in bed at end of session with breaks locked, bed alarm set, and all needs within reach.   Session 2: Patient in bed upon PT arrival. Patient alert and agreeable to PT session. Patient denied pain or nausea during session. Reported feeling tiered from medications.   Vitals:  Supine: BP 126/67, HR 99, SPO2 98% on 2L Sitting: BP 136/84, HR 100, SPO2 97% on 2L  Therapeutic Activity: Bed Mobility: Patient performed supine to/from sit as above. Patient sat EOB with supervision x3 min, denied any nausea or change in symptoms sitting EOB. Transfers: Patient performed sit to/from stand x2 from an elevated bed with min A of 1 person and a second person SBA for safety. Provided cues for forward weight shift and controlled movement. Stood >1 min each trial progressing from min A to CGA for standing balance. He performed a stand pivot bed<>BSC with min-mod A for balance. Provided verbal cues for controlled stepping and reaching back to sit. Patient was continent/incontinent of bowl on BSC, smear present on brief. Performed peri-care with total A from a second person. Patient had another incontinent BM while getting back into the bed. Placed on the bed pan with total A and min A for rolling. Patient attempted to void x3 during session, however, was unsuccessful each time. Performed rolling R to get off the bed pan with min A and total A for peri-care from a second person. Sacral dressing needed changed, RN returned and patient was handed off to RN and NT to complete peri-care and dressing change at end of session.  Patient tolerated mobility well without increased nausea, limited by bowl incontinence this session.    Therapy Documentation Precautions:  Precautions Precautions: Fall Precaution Comments: flaccid LUE (protect from subluxation); sling when OOB for comfort; skin integrity (sacral wound); monitor vitals Required Braces or  Orthoses: Sling Restrictions Weight Bearing Restrictions: No   Therapy/Group: Individual Therapy  Zanaiya Calabria L Derron Pipkins PT, DPT  06/19/2020, 2:10 PM

## 2020-06-19 NOTE — Progress Notes (Signed)
Physical Therapy Wound Treatment Patient Details  Name: Collin Brown MRN: [id number] Date of Birth: 08/03/1986  Today's Date: 06/19/2020 PT Individual Time: 1005-1040 PT Individual Time Calculation (min): 35 min   Subjective  Subjective: Pt reports feeling slightly better Patient and Family Stated Goals: did not state Date of Onset:  (unknown ) Prior Treatments: dressing change  Pain Score:  5/10  Wound Assessment  Pressure Injury 06/08/20 Coccyx Unstageable - Full thickness tissue loss in which the base of the injury is covered by slough (yellow, tan, gray, green or brown) and/or eschar (tan, brown or black) in the wound bed. yellow necrotic tissue in the crease o (Active)  Dressing Type ABD;Barrier Film (skin prep);Gauze (Comment);Moist to dry 06/19/20 1206  Dressing Changed;Clean;Dry;Intact 06/19/20 1206  Dressing Change Frequency Daily 06/19/20 1206  State of Healing Eschar 06/19/20 1206  Site / Wound Assessment Black;Red;Pale;Pink 06/19/20 1206  % Wound base Red or Granulating 10% 06/19/20 1206  % Wound base Yellow/Fibrinous Exudate 0% 06/19/20 1206  % Wound base Black/Eschar 90% 06/19/20 1206  % Wound base Other/Granulation Tissue (Comment) 0% 06/19/20 1206  Peri-wound Assessment Erythema (blanchable);Excoriated 06/19/20 1206  Wound Length (cm) 3 cm 06/18/20 1303  Wound Width (cm) 1 cm 06/18/20 1303  Wound Depth (cm) 5 cm 06/18/20 1303  Wound Surface Area (cm^2) 3 cm^2 06/18/20 1303  Wound Volume (cm^3) 15 cm^3 06/18/20 1303  Margins Unattached edges (unapproximated) 06/19/20 1206  Drainage Amount Minimal 06/19/20 1206  Drainage Description Serosanguineous 06/19/20 1206  Treatment Debridement (Selective);Hydrotherapy (Pulse lavage);Packing (Saline gauze) 06/19/20 1206   Santyl applied to wound bed prior to applying dressing.   Hydrotherapy Pulsed lavage therapy - wound location: sacrum Pulsed Lavage with Suction (psi): 12 psi Pulsed Lavage with Suction - Normal Saline  Used: 1000 mL Pulsed Lavage Tip: Tip with splash shield Selective Debridement Selective Debridement - Location: sacrum Selective Debridement - Tools Used: Forceps;Scalpel Selective Debridement - Tissue Removed: black eschar   Wound Assessment and Plan  Wound Therapy - Assess/Plan/Recommendations Wound Therapy - Clinical Statement: Pt reporting increased pain today with hydrotherapy; premedicated directly prior to session. Remains difficult to perform sharp debridement in context of very small opening of wound. Discussed with MD obtaining surgical consult. Pt will benefit from further hydrotherapy for selective debridement and in order to remove nonviable tissue. Wound Therapy - Functional Problem List: decreased mobility Factors Delaying/Impairing Wound Healing: Immobility Hydrotherapy Plan: Debridement;Patient/family education;Pulsatile lavage with suction Wound Therapy - Frequency: 6X / week Wound Therapy - Follow Up Recommendations: Home health RN Wound Plan: see above  Wound Therapy Goals- Improve the function of patient's integumentary system by progressing the wound(s) through the phases of wound healing (inflammation - proliferation - remodeling) by: Decrease Necrotic Tissue to: 50 Decrease Necrotic Tissue - Progress: Progressing toward goal Increase Granulation Tissue to: 50 Increase Granulation Tissue - Progress: Progressing toward goal Goals/treatment plan/discharge plan were made with and agreed upon by patient/family: Yes Time For Goal Achievement: 7 days Wound Therapy - Potential for Goals: Good  Goals will be updated until maximal potential achieved or discharge criteria met.  Discharge criteria: when goals achieved, discharge from hospital, MD decision/surgical intervention, no progress towards goals, refusal/missing three consecutive treatments without notification or medical reason.  GP    Wyona Almas, PT, DPT Acute Rehabilitation Services Pager  [phone] Office [phone]   Deno Etienne 06/19/2020, 12:12 PM

## 2020-06-19 NOTE — Patient Care Conference (Signed)
Inpatient RehabilitationTeam Conference and Plan of Care Update Date: 06/19/2020   Time: 10:17 AM    Patient Name: Collin Brown      Medical Record Number: [id number]  Date of Birth: 06/01/1986 Sex: Male         Room/Bed: 4W16C/4W16C-01 Payor Info: Payor: BLUE CROSS BLUE SHIELD / Plan: BCBS COMM PPO / Product Type: *No Product type* /    Admit Date/Time:  06/08/2020 12:01 PM  Primary Diagnosis:  Critical illness myopathy  Hospital Problems: Principal Problem:   Critical illness myopathy Active Problems:   Debility   Essential hypertension   Diabetes mellitus, new onset (HCC)   Brachial plexopathy   Sepsis due to undetermined organism (HCC)   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 Faith Rogue Care Coodinator Present: Cecile Sheerer, LCSWA;Jobina Maita [name], RN, BSN, CRRN Nurse Present: Magdalen Spatz) [place], LPN PT Present: Serina Cowper, PT OT Present: Jake Shark, OT PPS Coordinator present : Edson Snowball, [place], SLP     Current Status/Progress Goal Weekly Team Focus  Bowel/Bladder   continent of b/b; LBM: 11/08  remain continent of b/b  assist with toileting need prn   Swallow/Nutrition/ Hydration             ADL's   Mod A UB dressing, mod A LB dressing, min-mod A transfers, poor activity tolerance  CGA overall  Functional activity tolerance, weaning O2, ADL retraining and transfers, LUE   Mobility   Mod-min A overall, gait 70 ft with [place] walker  Upgraded to CGA-supervision overall, gait 150 ft  Activity tolerance, gait training, L upper extremity NMR/ROM, functional mobility, strengthening, patient/caregiver education   Communication             Safety/Cognition/ Behavioral Observations            Pain   c/o pain to LUE; prn oxy 10mg  Q4hr  <4 on a 0-10 pain scale  assess QS and  prn   Skin   unstageable to cocyxx with daily dresing change; OTA blisters on bil great toes  continue dressing change per order; remain free of new skin breakdown  assess skin QS and prn     Discharge Planning:  D/c to home with support from his mother (supervision) and nephew (physical assistance).   Team Discussion: No new skin issues, liquid bowel movements still going on, however continent B/B. OT reports patient min/mod assist, poor activity tolerance. PT reports patient tolerated 70' ambulation last week but only tolerating bed level activities this week due to illness over the weekend. MD reports hydrotherapy working on sacral wound, and MD is changing IV antibiotics. Patient on target to meet rehab goals: yes, expecting patient to recover from weekend illness and get back to level of previous week and continue to progress towards discharge goals.  *See Care Plan and progress notes for long and short-term goals.   Revisions to Treatment Plan:  Not at this time.  Teaching Needs: Continue with family education.  Current Barriers to Discharge: Decreased caregiver support, Medical stability, Home enviroment access/layout, IV antibiotics, Wound care, Weight and New oxygen  Possible Resolutions to Barriers: Continue education on wound care, educate on oxygen therapy, continue current medication regimen.     Medical Summary Current Status: fever this weekend, likely urosepsis. loose stool. 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 LUE pain controlled  Barriers to Discharge: Medical stability  Possible Resolutions to [employer]: daily wound care, and assessment. iv abx/rx UTI, oxygen therapy/pulmonary assessment   Continued Need for Acute Rehabilitation Level of Care: The patient requires daily medical management by a physician with specialized training in physical medicine and rehabilitation for the following reasons: Direction of a multidisciplinary physical  rehabilitation program to maximize functional independence : Yes Medical management of patient stability for increased activity during participation in an intensive rehabilitation regime.: Yes Analysis of laboratory values and/or radiology reports with any subsequent need for medication adjustment and/or medical intervention. : Yes   I attest that I was present, lead the team conference, and concur with the assessment and plan of the team.   Tennis Must 06/19/2020, 2:16 PM

## 2020-06-19 NOTE — Progress Notes (Signed)
Pt is on Augmentin for his klebsiella UTI. His GI panel also came back with salmonella. D/w Dr. Pola Corn, we will treat for a total of 5d of abx. He has got 2 days of zosyn prior to Augmentin.  Ulyses Southward, PharmD, BCIDP, AAHIVP, CPP Infectious Disease Pharmacist 06/19/2020 10:16 AM

## 2020-06-19 NOTE — Progress Notes (Signed)
Patient ID: Collin Brown, male   DOB: 04/16/1986, 34 y.o.   MRN: [id number]  SW met with pt in room to provide updates from team conference, and pt remains on target for d/c. Pt remains encouraged to make gains in rehab. SW left message for pt mother Shirlean Schlein [phone]) to provide updates and schedule family edu. SW waiting on follow-up.   Loralee Pacas, MSW, [place] Office: [phone] Cell: [phone] Fax: [phone]

## 2020-06-19 NOTE — Progress Notes (Signed)
Occupational Therapy Session Note  Patient Details  Name: Collin Brown MRN: [id number] Date of Birth: August 13, 1985  Today's Date: 06/19/2020 OT Individual Time: 8563-1497 OT Individual Time Calculation (min): 43 min    Short Term Goals: Week 2:  OT Short Term Goal 1 (Week 2): Pt will don shirt with min A OT Short Term Goal 2 (Week 2): Pt will complete stand pivot transfer to the [facility] with min A OT Short Term Goal 3 (Week 2): Pt will don LB clothing with mod A and LRAD  Skilled Therapeutic Interventions/Progress Updates:    Pt received supine with c/o pain in his L arm 2/2 it dangling off the bed during bed mobility with nursing staff. Pt agreeable to attempt EOB sitting. Pt rolled to his R side and required min A to power up trunk into sitting. Pt was able to maintain sitting balance EOB with supervision. He completed oral care and grooming tasks with set up assist. Mod A for hair care EOB. Throughout session pt on 2L O2 via [place]. At rest and EOB he was at 93% SpO2 and HR 102-106 bpm. Pt scooted toward [facility] with min A. Pt returned to supine with min A. He was able to scoot up in bed with bed in trend position. He attempted to void urine and was unsuccessful- reporting he has not voided urine in 9 hours- RN notified. Pt was assisted in obtaining side lying position to assist with sacrum wound healing, with several pillows positioned. Pt was left with all needs met, bed alarm set.   Therapy Documentation Precautions:  Precautions Precautions: Fall Precaution Comments: flaccid LUE (protect from subluxation); sling when OOB for comfort; skin integrity (sacral wound); monitor vitals Required Braces or Orthoses: Sling Restrictions Weight Bearing Restrictions: No   Therapy/Group: Individual Therapy  Curtis Sites 06/19/2020, 7:35 AM

## 2020-06-19 NOTE — Progress Notes (Signed)
Physical Therapy Session Note  Patient Details  Name: FELDER LEBEDA MRN: [id number] Date of Birth: 28-Nov-1985  Today's Date: 06/19/2020 PT Individual Time: 4166-0630 PT Individual Time Calculation (min): 53 min   Short Term Goals: Week 2:  PT Short Term Goal 1 (Week 2): Patient will perform bed mobility with CGA without use of hospital bed functions. PT Short Term Goal 2 (Week 2): Patient will perform basic transfers with min A consitently. PT Short Term Goal 3 (Week 2): Patient will ambulate 100 ft using LRAD with mod A.  Skilled Therapeutic Interventions/Progress Updates:    [name], [place] cleared pt to participate in therapy to pt tolerance.  Pt received supine in bed reporting he still doesn't feel well but is agreeable to bed level exercise although doesn't feel up to getting OOB. States he is still having nausea but that it has improved some since over the weekend. SpO2 95-97% on 2.5L of O2 and HR 100bpm while performing supine exercises Performed the following B LE exercises with therapist providing cuing for proper form/technique:  - ankle DF/PF with emphasis on DF ROM x20 reps - quad sets 2x10 reps 5 second hold - glute sets 2x10reps 5 second hold - heel slides with leg off bed x10 reps   L UE AAROM/PROM:  - hand squeezes x10 reps 5 second holds; demos ability to perform light squeeze and release but not full finger extension - wrist flexion/extension with pt able to perform active flexion with gravity but not extension - elbow flexion/extension; demos possible trace bicep activation but no triceps Of note: pt has "sharp" pain with elbow flexion >100degrees stating it is located in the bend of his elbow (on flexor side) with a soft end feel  - also noted to have significant tenderness to palpation of lateral epicondyle of elbow  - pt reports no imaging has been performed of the elbow  - PROM of shoulder flexion/abduction <90degrees x10 reps each Of note: during shoulder flexion PROM  therapist felt a slight pop in pt's elbow, no significant pain associated with it, then afterward pt demoing increased L elbow flexion ROM ~120degrees prior to onset of pain  Therapist educated pt on importance of sidelying to relieve pressure on sacrum and improve wound healing - repositioned in R sidelying with pillows therapeutically positioned to support L UE. Pt left with needs in reach and bed alarm on.  Therapy Documentation Precautions:  Precautions Precautions: Fall Precaution Comments: flaccid LUE (protect from subluxation); sling when OOB for comfort; skin integrity (sacral wound); monitor vitals Required Braces or Orthoses: Sling Restrictions Weight Bearing Restrictions: No  Pain: Reports L elbow pain with flexion >100degrees - during PROM of shoulder felt a slight "pop" in the elbow and then pt able to advance to ~120degrees of flexion prior to pain onset - RN notified for medication administration - will notify primary PT of pt's L elbow pain.    Therapy/Group: Individual Therapy  Ginny Forth , PT, DPT, CSRS  06/19/2020, 8:00 AM

## 2020-06-19 NOTE — Progress Notes (Signed)
PROGRESS NOTE  Collin Brown  DOB: 08/27/1985  PCP: Patient, No Pcp Per [facility]:[id number]  DOA: 06/08/2020  LOS: 11 days   Chief complaint: Medical consultation for fever  Brief narrative: Collin Brown is an 34 y.o. male  with history of morbid obesity, OSA, OHS, DM who was hospitalized at [facility] (9/23-10/29) with acute respiratory failure associated with COVID-19 pneumonia and ARDS. While hospitalized he developed LUE weakness which was thought to be a brachial plexus injury.  He also had generalized weakness due to critical illness myopathy He has been recovering at [facility] since discharge from [facility].   11/5, patient started having abdominal pain, loose stool, fever and worsening generalized weakness. Fever was noted of up to 102.   Chest x-ray showed persistent albeit slightly improved heterogeneous airspace and interstitial opacities throughout both lungs. Urinalysis 11/7 showed a hazy yellow urine with positive leukocytes and many bacteria.  Urine culture grew more than 100,000 CFU per mL of Klebsiella pneumoniae. Stool studies with C. difficile negative.  GI panel detected Salmonella species.  Subjective: Patient was seen and examined this morning Pleasant, young, morbidly obese Caucasian male. Lying down in bed.  Not in distress.  Shallow breathing.  Remains on 3 L oxygen by nasal cannula.  No fever last 24 hours.   Assessment/Plan: Sepsis secondary to UTI versus Salmonella gastroenteritis -Fever, leukocytosis -Klebsiella in urine and Salmonella in stool.  Positive urinalysis.  -Other potential causes of infection were looked for as well. -Fever free for last 48 hours. -Continue Augmentin to complete 5-day course.  COVID long-hauler with critical illness myopathy, debility, and brachial plexopathy -Patient was previously hospitalized for a long time due to complications of COVID-19 -He was transferred to rehab and has been showing improvement in therapy -Continue incentive  spirometry and other supportive cares.  HTN -Continue Lopressor  DM -A1c was 7.2 during hospitalization -Started on Levemir 15 units daily -Cover with resistant-scale SSI  HLD -He is recommended to start statin therapy when his myopathy is improved  OHS/OSA -Previously on CPAP at home -Pulmonology recommended qhs BIPAP while hospitalized and after d/c -Will need outpatient pulmonology f/u at the time of d/c  Morbid obesity -BMI 50.87 (down significantly since admission) -Weight loss should be encouraged -Outpatient PCP/bariatric medicine/bariatric surgery f/u encouraged  Thank you for the consult.  We will sign off.  Please reconsult if needed.  Mobility: Continue to work with therapy Code Status:   Code Status: Full Code  Nutritional status: Body mass index is 49.19 kg/m.     Diet Order            Diet Heart Room service appropriate? Yes; Fluid consistency: Thin  Diet effective now                 DVT prophylaxis: Place TED hose Start: 06/08/20 1215   Antimicrobials:  Oral Augmentin Fluid: None Family Communication:  Not at bedside   Infusions:    Scheduled Meds: . amoxicillin-clavulanate  1 tablet Oral Q12H  . vitamin C  500 mg Oral Daily  . aspirin  81 mg Oral Daily  . collagenase   Topical Daily  . enoxaparin (LOVENOX) injection  0.5 mg/kg Subcutaneous Q24H  . feeding supplement (NEPRO CARB STEADY)  237 mL Oral TID BM  . gabapentin  400 mg Oral TID  . insulin aspart  0-20 Units Subcutaneous TID WC  . insulin aspart  4 Units Subcutaneous TID WC  . insulin detemir  15 Units Subcutaneous BID  .  metoprolol tartrate  75 mg Oral BID  . multivitamin with minerals  1 tablet Oral Daily    Antimicrobials: Anti-infectives (From admission, onward)   Start     Dose/Rate Route Frequency Ordered Stop   06/18/20 2000  amoxicillin-clavulanate (AUGMENTIN) 875-125 MG per tablet 1 tablet        1 tablet Oral Every 12 hours 06/18/20 1346 06/21/20 1013    06/17/20 2359  piperacillin-tazobactam (ZOSYN) IVPB 3.375 g  Status:  Discontinued        3.375 g 12.5 mL/hr over 240 Minutes Intravenous Every 8 hours 06/16/20 2353 06/17/20 0013   06/17/20 0800  vancomycin (VANCOREADY) IVPB 1250 mg/250 mL  Status:  Discontinued        1,250 mg 166.7 mL/hr over 90 Minutes Intravenous Every 8 hours 06/17/20 0258 06/18/20 1346   06/17/20 0030  vancomycin (VANCOREADY) IVPB 2000 mg/400 mL        2,000 mg 200 mL/hr over 120 Minutes Intravenous  Once 06/16/20 2353 06/17/20 0435   06/17/20 0030  piperacillin-tazobactam (ZOSYN) IVPB 3.375 g  Status:  Discontinued        3.375 g 12.5 mL/hr over 240 Minutes Intravenous Every 8 hours 06/17/20 0013 06/18/20 1346      PRN meds: acetaminophen, ondansetron, oxyCODONE, prochlorperazine   Objective: Vitals:   06/19/20 0533 06/19/20 1430  BP: 138/85 125/64  Pulse: (!) 107 (!) 101  Resp: 20 20  Temp: 98.4 F (36.9 C) 98.4 F (36.9 C)  SpO2: (!) 84% 100%    Intake/Output Summary (Last 24 hours) at 06/19/2020 1503 Last data filed at 06/19/2020 1329 Gross per 24 hour  Intake 300 ml  Output 750 ml  Net -450 ml   Filed Weights   06/17/20 0500 06/18/20 0436 06/19/20 0500  Weight: (!) 160.8 kg (!) 159.9 kg (!) 155.5 kg   Weight change: -4.4 kg Body mass index is 49.19 kg/m.   Physical Exam:  General exam: Morbidly obese young male. Skin: No rashes, lesions or ulcers. HEENT: Atraumatic, normocephalic, supple neck, no obvious bleeding Lungs: Diminished air entry in both bases. CVS: Regular rate and rhythm, no murmur GI/Abd soft, distended obesity, nontender, CNS: Alert, awake monitor x3 Psychiatry: Depressed look Extremities: No pedal edema, no calf tenderness  Data Review: I have personally reviewed the laboratory data and studies available.  Recent Labs  Lab 06/17/20 0118  WBC 11.4*  NEUTROABS 9.3*  HGB 11.4*  HCT 36.1*  MCV 89.8  PLT 284   Recent Labs  Lab 06/17/20 0118  NA 133*  K 3.5   CL 96*  CO2 28  GLUCOSE 149*  BUN 10  CREATININE 0.64  CALCIUM 8.7*    F/u labs ordered.  Signed, Lorin Glass, MD [facility] 06/19/2020

## 2020-06-19 NOTE — Progress Notes (Signed)
[place] PHYSICAL MEDICINE & REHABILITATION PROGRESS NOTE   Subjective/Complaints: Feeling better today. Feels like eating again. Tolerated breakfast.   ROS: Patient denies fever, rash, sore throat, blurred vision, nausea, vomiting, diarrhea, cough,   chest pain, joint or back pain, headache, or mood change.   Objective:   No results found. Recent Labs    06/17/20 0118  WBC 11.4*  HGB 11.4*  HCT 36.1*  PLT 284   Recent Labs    06/17/20 0118  NA 133*  K 3.5  CL 96*  CO2 28  GLUCOSE 149*  BUN 10  CREATININE 0.64  CALCIUM 8.7*    Intake/Output Summary (Last 24 hours) at 06/19/2020 1029 Last data filed at 06/19/2020 0825 Gross per 24 hour  Intake 200 ml  Output 750 ml  Net -550 ml     Pressure Injury 06/08/20 Coccyx Unstageable - Full thickness tissue loss in which the base of the injury is covered by slough (yellow, tan, gray, green or brown) and/or eschar (tan, brown or black) in the wound bed. yellow necrotic tissue in the crease o (Active)  06/08/20 1830 (with Lizabeth Leyden RN)  Location: Coccyx  Location Orientation:   Staging: Unstageable - Full thickness tissue loss in which the base of the injury is covered by slough (yellow, tan, gray, green or brown) and/or eschar (tan, brown or black) in the wound bed.  Wound Description (Comments): yellow necrotic tissue in the crease originally put in as a nonpressure wound  Present on Admission: Yes    Physical Exam: Vital Signs Blood pressure 138/85, pulse (!) 107, temperature 98.4 F (36.9 C), resp. rate 20, height 5\' 10"  (1.778 m), weight (!) 155.5 kg, SpO2 (!) 84 %.  Constitutional: No distress . Vital signs reviewed.looks more comfortable HEENT: EOMI, oral membranes moist Neck: supple Cardiovascular: RRR without murmur. No JVD    Respiratory/Chest: CTA Bilaterally without wheezes or rales. Normal effort    GI/Abdomen: BS +, non-tender, non-distended Ext: no clubbing, cyanosis, or edema Psych: pleasant  and cooperative Skin: eschar on toes, stable. Sacral wound with exposed pink granulation tissue. fibronecrotic debris much less. Mild odor only Neuro: Pt is cognitively appropriate with normal insight, memory, and awareness. Cranial nerves 2-12 are intact. DTR's 1+.  Fine motor coordination is intact. No tremors. Motor function is grossly 5/5 RUE. LUE with ABD/EE,WE, moving wirst and fingers a bit more.  Musculoskeletal: Full ROM, No pain with AROM or PROM in the neck, trunk, or LE's.  Posture appropriate       Assessment/Plan: 1. 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  Physiatrist is providing close team supervision and 24 hour management of active medical problems listed below.  Physiatrist and rehab team continue to assess barriers to discharge/monitor patient progress toward functional and medical goals  Care Tool:  Bathing    Body parts bathed by patient: Chest, Abdomen, Front perineal area, Right upper leg, Left upper leg, Face   Body parts bathed by helper: Right arm, Left arm, Buttocks, Left lower leg, Right lower leg     Bathing assist Assist Level: 2 Helpers     Upper Body Dressing/Undressing Upper body dressing   What is the patient wearing?: Hospital gown only    Upper body assist Assist Level: Moderate Assistance - Patient 50 - 74%    Lower Body Dressing/Undressing Lower body dressing      What is the patient wearing?:  Incontinence brief     Lower body assist Assist for lower body dressing: 2 Helpers     Toileting Toileting    Toileting assist Assist for toileting: Minimal Assistance - Patient > 75% (Assist the pt with the urinal)     Transfers Chair/bed transfer  Transfers assist  Chair/bed transfer activity did not occur: Safety/medical concerns  Chair/bed transfer assist level: Moderate Assistance - Patient 50 -  74% Chair/bed transfer assistive device: Armrests   Locomotion Ambulation   Ambulation assist   Ambulation activity did not occur: Safety/medical concerns  Assist level: 2 helpers (min/mod A of 1 and +2 w/c follow & O2 line management) Assistive device: Walker-Eva Max distance: 94ft   Walk 10 feet activity   Assist  Walk 10 feet activity did not occur: Safety/medical concerns  Assist level: 2 helpers (min/mod A of 1 and +2 w/c follow & O2 line management) Assistive device: Walker-Eva   Walk 50 feet activity   Assist Walk 50 feet with 2 turns activity did not occur: Safety/medical concerns (did not perform turns)  Assist level: 2 helpers Assistive device: Walker-Eva    Walk 150 feet activity   Assist Walk 150 feet activity did not occur: Safety/medical concerns         Walk 10 feet on uneven surface  activity   Assist Walk 10 feet on uneven surfaces activity did not occur: Safety/medical concerns         Wheelchair     Assist Will patient use wheelchair at discharge?: No   Wheelchair activity did not occur: Safety/medical concerns         Wheelchair 50 feet with 2 turns activity    Assist    Wheelchair 50 feet with 2 turns activity did not occur: Safety/medical concerns       Wheelchair 150 feet activity     Assist  Wheelchair 150 feet activity did not occur: Safety/medical concerns       Blood pressure 138/85, pulse (!) 107, temperature 98.4 F (36.9 C), resp. rate 20, height 5\' 10"  (1.778 m), weight (!) 155.5 kg, SpO2 (!) 84 %.  Medical Problem List and Plan: 1.  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             -  therapies as tolerated, team conference today. Probably lost some ground from current infection             2.   Antithrombotics: -DVT/anticoagulation: Lovenox 85 mg daily.               -antiplatelet therapy: Aspirin 81 mg daily 3. Pain Management: Advil as needed             Oxycodone as needed             11/5: pain improved with 400mg  tid of gabapentin  11/6-9: stable 4. Mood: Provide emotional support             -antipsychotic agents: N/A 5. Neuropsych: This patient is capable of making decisions on his own behalf. 6. 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 Wound looks better today  -spoke with surgery who will see only if an  infection or worsening is suspected.   - continue routine skin checks. Pressure relief, nutrition 7. Fluids/Electrolytes/Nutrition: Routine in and outs.  CMP ordered. 8.  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  Patient completed 5-day course Solu-Medrol             Supportive care with range of motion             Recommend NCV/EMG as outpatient.  -increased gabapentin as above 9.  New onset diabetes mellitus, hemoglobin A1c 7.2.  NovoLog 4 units 3 times daily, Levemir 15 units twice daily.  Provide diabetic teaching             CBG (last 3)  Recent Labs    06/18/20 1645 06/18/20 2004 06/19/20 0631  GLUCAP 93 96 91   -11/5: CBGs well controlled 10.  Hypertension with tachycardia.  Lopressor 50 mg twice daily.               10/31 pt hypotensive yesterday with OT which wasn't unexpected   -TEDS, Abd binder   -push po fluids   -continue to acclimate, get OOB   -reassured him that this is to be expected   -continue to monitor  11/3: Increased Lopressor to 75mg  BID  11/9 bp  controlled today 11.  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  Dietary follow-up 12. Fever, likely urosepsis. UCX with 100 Klebsiella  -stool sample also positive for salmonella  -chest xray and exam unremarkable  -blood cultures pending  -abx narrowed to augmentin per pharmacy note  -encourage PO  -appreciate  family medicine assist!  -loose stool related to multiple laxatives but also salmonella     LOS: 11 days A FACE TO FACE EVALUATION WAS PERFORMED  13/9 06/19/2020, 10:29 AM

## 2020-06-20 ENCOUNTER — Inpatient Hospital Stay (HOSPITAL_COMMUNITY): Payer: BC Managed Care – PPO

## 2020-06-20 ENCOUNTER — Inpatient Hospital Stay (HOSPITAL_COMMUNITY): Payer: BC Managed Care – PPO | Admitting: Physical Therapy

## 2020-06-20 ENCOUNTER — Inpatient Hospital Stay (HOSPITAL_COMMUNITY): Payer: BC Managed Care – PPO | Admitting: *Deleted

## 2020-06-20 DIAGNOSIS — G7281 Critical illness myopathy: Secondary | ICD-10-CM | POA: Diagnosis not present

## 2020-06-20 LAB — CBC WITH DIFFERENTIAL/PLATELET
Abs Immature Granulocytes: 0.1 10*3/uL — ABNORMAL HIGH (ref 0.00–0.07)
Basophils Absolute: 0 10*3/uL (ref 0.0–0.1)
Basophils Relative: 1 %
Eosinophils Absolute: 0.1 10*3/uL (ref 0.0–0.5)
Eosinophils Relative: 1 %
HCT: 34.7 % — ABNORMAL LOW (ref 39.0–52.0)
Hemoglobin: 10.9 g/dL — ABNORMAL LOW (ref 13.0–17.0)
Immature Granulocytes: 2 %
Lymphocytes Relative: 22 %
Lymphs Abs: 1.3 10*3/uL (ref 0.7–4.0)
MCH: 28 pg (ref 26.0–34.0)
MCHC: 31.4 g/dL (ref 30.0–36.0)
MCV: 89.2 fL (ref 80.0–100.0)
Monocytes Absolute: 0.7 10*3/uL (ref 0.1–1.0)
Monocytes Relative: 12 %
Neutro Abs: 3.8 10*3/uL (ref 1.7–7.7)
Neutrophils Relative %: 62 %
Platelets: 256 10*3/uL (ref 150–400)
RBC: 3.89 MIL/uL — ABNORMAL LOW (ref 4.22–5.81)
RDW: 16.6 % — ABNORMAL HIGH (ref 11.5–15.5)
WBC: 6 10*3/uL (ref 4.0–10.5)
nRBC: 0 % (ref 0.0–0.2)

## 2020-06-20 LAB — BASIC METABOLIC PANEL
Anion gap: 11 (ref 5–15)
BUN: 6 mg/dL (ref 6–20)
CO2: 30 mmol/L (ref 22–32)
Calcium: 8.8 mg/dL — ABNORMAL LOW (ref 8.9–10.3)
Chloride: 97 mmol/L — ABNORMAL LOW (ref 98–111)
Creatinine, Ser: 0.51 mg/dL — ABNORMAL LOW (ref 0.61–1.24)
GFR, Estimated: >60 mL/min (ref >60)
Glucose, Bld: 102 mg/dL — ABNORMAL HIGH (ref 70–99)
Potassium: 3.1 mmol/L — ABNORMAL LOW (ref 3.5–5.1)
Sodium: 138 mmol/L (ref 135–145)

## 2020-06-20 LAB — GLUCOSE, CAPILLARY
Glucose-Capillary: 103 mg/dL — ABNORMAL HIGH (ref 70–99)
Glucose-Capillary: 126 mg/dL — ABNORMAL HIGH (ref 70–99)
Glucose-Capillary: 95 mg/dL (ref 70–99)
Glucose-Capillary: 125 mg/dL — ABNORMAL HIGH (ref 70–99)
Glucose-Capillary: 122 mg/dL — ABNORMAL HIGH (ref 70–99)

## 2020-06-20 MED ORDER — POTASSIUM CHLORIDE CRYS ER 20 MEQ PO TBCR
40.0000 meq | EXTENDED_RELEASE_TABLET | Freq: Two times a day (BID) | ORAL | Status: DC
  Administered 2020-06-20 – 2020-07-13 (×47): 40 meq via ORAL
  Filled 2020-06-20 (×48): qty 2

## 2020-06-20 NOTE — Progress Notes (Signed)
Occupational Therapy Session Note  Patient Details  Name: Collin Brown MRN: [id number] Date of Birth: [birth year]  Today's Date: 06/20/2020 OT Individual Time: 1100-1203 OT Individual Time Calculation (min): 63 min    Short Term Goals: Week 2:  OT Short Term Goal 1 (Week 2): Pt will don shirt with min A OT Short Term Goal 2 (Week 2): Pt will complete stand pivot transfer to the [facility] with min A OT Short Term Goal 3 (Week 2): Pt will don LB clothing with mod A and LRAD  Skilled Therapeutic Interventions/Progress Updates:  Patient met lying supine in bed in agreement with OT treatment session. Mild pain at buttocks. Patient reports premedication for pain management. Supine to EOB with HOB flat, use of bed rail and increased time 2/2 fatigue. Sling applied seated EOB for comfort. Sit to stand with Min A +1 standby and stand-pivot transfer to [place] with Min guard A and hand held assist. Seated at sink level, patient completed 3/3 grooming tasks with set-up assist. Total A for wc transport room <>dayroom in TIS wc. Patient able to take steps from TIS wc to mat table with use of eva support walker. 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 Scrabble game set up with patient able to read instructions and divide tiles between this therapist and himself. 2 rounds completed in sitting with patient keeping score on paper and 1 round completed in standing with patient completing sit to stand from mat table with Min A. Session concluded with patient lying supine in bed with call bell within reach, bed alarm activated, and all needs met.   Therapy Documentation Precautions:  Precautions Precautions: Fall Precaution Comments: flaccid LUE (protect from subluxation); sling when OOB for comfort; skin integrity (sacral wound); monitor vitals Required Braces or Orthoses: Sling Restrictions Weight Bearing Restrictions: No General:    Therapy/Group: Individual  Therapy  Kimra Kantor R Howerton-Davis 06/20/2020, 7:32 AM

## 2020-06-20 NOTE — Progress Notes (Signed)
[facility] PHYSICAL MEDICINE & REHABILITATION PROGRESS NOTE   Subjective/Complaints: Feeling much better Has pain over sacral wound- placed order for air mattress, has been receiving hydrotherapy Left arm pain worse as arm fell off bed yesterday  ROS: Patient denies fever, rash, sore throat, blurred vision, nausea, vomiting, diarrhea, cough,   chest pain, joint or back pain, headache, or mood change.   Objective:   No results found. Recent Labs    06/20/20 0418  WBC 6.0  HGB 10.9*  HCT 34.7*  PLT 256   Recent Labs    06/20/20 0418  NA 138  K 3.1*  CL 97*  CO2 30  GLUCOSE 102*  BUN 6  CREATININE 0.51*  CALCIUM 8.8*    Intake/Output Summary (Last 24 hours) at 06/20/2020 1120 Last data filed at 06/20/2020 0745 Gross per 24 hour  Intake 560 ml  Output 1300 ml  Net -740 ml     Pressure Injury 06/08/20 Coccyx Unstageable - Full thickness tissue loss in which the base of the injury is covered by slough (yellow, tan, gray, green or brown) and/or eschar (tan, brown or black) in the wound bed. yellow necrotic tissue in the crease o (Active)  06/08/20 1830 (with Lizabeth Leyden RN)  Location: Coccyx  Location Orientation:   Staging: Unstageable - Full thickness tissue loss in which the base of the injury is covered by slough (yellow, tan, gray, green or brown) and/or eschar (tan, brown or black) in the wound bed.  Wound Description (Comments): yellow necrotic tissue in the crease originally put in as a nonpressure wound  Present on Admission: Yes    Physical Exam: Vital Signs Blood pressure (!) 108/57, pulse 97, temperature 98 F (36.7 C), temperature source Oral, resp. rate 18, height 5\' 10"  (1.778 m), weight (!) 154.9 kg, SpO2 92 %.  General: Alert, No apparent distress HEENT: Head is normocephalic, atraumatic, PERRLA, EOMI, sclera anicteric, oral mucosa pink and moist, dentition intact, ext ear canals clear,  Neck: Supple without JVD or lymphadenopathy Heart: Reg  rate and rhythm. No murmurs rubs or gallops Chest: CTA bilaterally without wheezes, rales, or rhonchi; no distress Abdomen: Soft, non-tender, non-distended, bowel sounds positive. Extremities: No clubbing, cyanosis, or edema. Pulses are 2+ Psych: pleasant and cooperative Skin: eschar on toes, stable. Sacral wound with exposed pink granulation tissue. fibronecrotic debris much less. Mild odor only Neuro: Pt is cognitively appropriate with normal insight, memory, and awareness. Cranial nerves 2-12 are intact. DTR's 1+.  Fine motor coordination is intact. No tremors. Motor function is grossly 5/5 RUE. LUE with ABD/EE,WE, moving wirst and fingers a bit more.  Musculoskeletal: Full ROM, No pain with AROM or PROM in the neck, trunk, or LE's.  Posture appropriate       Assessment/Plan: 1. 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  Physiatrist is providing close team supervision and 24 hour management of active medical problems listed below.  Physiatrist and rehab team continue to assess barriers to discharge/monitor patient progress toward functional and medical goals  Care Tool:  Bathing    Body parts bathed by patient: Chest, Abdomen, Front perineal area, Right upper leg, Left upper leg, Face   Body parts bathed by helper: Right arm, Left arm, Buttocks, Left lower leg, Right lower leg     Bathing assist Assist Level: 2 Helpers     Upper Body Dressing/Undressing Upper body dressing   What is the  patient wearing?: Hospital gown only    Upper body assist Assist Level: Moderate Assistance - Patient 50 - 74%    Lower Body Dressing/Undressing Lower body dressing      What is the patient wearing?: Incontinence brief     Lower body assist Assist for lower body dressing: 2 Helpers     Toileting Toileting    Toileting assist Assist for toileting: Minimal  Assistance - Patient > 75% (Assist the pt with the urinal)     Transfers Chair/bed transfer  Transfers assist  Chair/bed transfer activity did not occur: Safety/medical concerns  Chair/bed transfer assist level: Moderate Assistance - Patient 50 - 74% Chair/bed transfer assistive device: Armrests   Locomotion Ambulation   Ambulation assist   Ambulation activity did not occur: Safety/medical concerns  Assist level: 2 helpers (min/mod A of 1 and +2 w/c follow & O2 line management) Assistive device: Walker-Eva Max distance: 41ft   Walk 10 feet activity   Assist  Walk 10 feet activity did not occur: Safety/medical concerns  Assist level: 2 helpers (min/mod A of 1 and +2 w/c follow & O2 line management) Assistive device: Walker-Eva   Walk 50 feet activity   Assist Walk 50 feet with 2 turns activity did not occur: Safety/medical concerns (did not perform turns)  Assist level: 2 helpers Assistive device: Walker-Eva    Walk 150 feet activity   Assist Walk 150 feet activity did not occur: Safety/medical concerns         Walk 10 feet on uneven surface  activity   Assist Walk 10 feet on uneven surfaces activity did not occur: Safety/medical concerns         Wheelchair     Assist Will patient use wheelchair at discharge?: No   Wheelchair activity did not occur: Safety/medical concerns         Wheelchair 50 feet with 2 turns activity    Assist    Wheelchair 50 feet with 2 turns activity did not occur: Safety/medical concerns       Wheelchair 150 feet activity     Assist  Wheelchair 150 feet activity did not occur: Safety/medical concerns       Blood pressure (!) 108/57, pulse 97, temperature 98 F (36.7 C), temperature source Oral, resp. rate 18, height 5\' 10"  (1.778 m), weight (!) 154.9 kg, SpO2 92 %.  Medical Problem List and Plan: 1.  CIM with?  Brachial plexopathy secondary to acute hypoxic respiratory failure secondary to  COVID-19 pneumonia  Patient is no longer on isolation             -patient may shower             -ELOS/Goals: 18-21 days/supervision/min A.             - continue therapies as tolerated, Probably lost some ground from current infection 2.  Antithrombotics: -DVT/anticoagulation: Lovenox 85 mg daily.               -antiplatelet therapy: Aspirin 81 mg daily 3. Pain Management: Advil as needed             Oxycodone as needed             11/5: pain improved with 400mg  tid of gabapentin  11/6-9: stable  11/10: worse left arm pain after it fell of bed today. Prefers not to go up on Gabapentin. Can consider kpad once infection has resolved, as tender over biceps 4. Mood: Provide emotional support             -  antipsychotic agents: N/A 5. Neuropsych: This patient is capable of making decisions on his own behalf. 6. 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 Wound looks better today  -spoke with surgery who will see only if an infection or worsening is suspected.   - continue routine skin checks. Pressure relief, nutrition 7. Fluids/Electrolytes/Nutrition: Routine in and outs.  CMP ordered. 8.  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  Patient completed 5-day course Solu-Medrol             Supportive care with range of motion             Recommend NCV/EMG as outpatient.  -increased gabapentin as above 9.  New onset diabetes mellitus, hemoglobin A1c 7.2.  NovoLog 4 units 3 times daily, Levemir 15 units twice daily.  Provide diabetic teaching             CBG (last 3)  Recent Labs    06/19/20 1655 06/19/20 1940 06/20/20 0612  GLUCAP 101* 129* 103*   -11/10: CBGs well controlled 10.  Hypertension with tachycardia.  Lopressor 50 mg twice daily.               10/31 pt hypotensive yesterday with OT which wasn't unexpected   -TEDS, Abd binder   -push po fluids   -continue to acclimate, get OOB   -reassured him that  this is to be expected   -continue to monitor  11/3: Increased Lopressor to 75mg  BID  11/9 bp  controlled today 11.  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  Dietary follow-up 12. Fever, likely urosepsis. UCX with 100 Klebsiella  -stool sample also positive for salmonella  -chest xray and exam unremarkable  -blood cultures pending  -abx narrowed to augmentin per pharmacy note  -encourage PO  -appreciate family medicine assist!  -loose stool related to multiple laxatives but also salmonella 13. Hypokalemia: K+ 3.1 on 11/10: supplement Kdur 13/10 BID and repeat tomorrow. Adjust K+ dose as necessary.      LOS: 12 days A FACE TO FACE EVALUATION WAS PERFORMED  Joice Nazario P Calem Cocozza 06/20/2020, 11:20 AM

## 2020-06-20 NOTE — Progress Notes (Addendum)
Patient ID: Collin Brown, male   DOB: 1985/09/08, 34 y.o.   MRN: [id number]  SW received return phone call from pt mother Collin Brown [phone]) to provide updates from team conference. Mother reports she will be the only caregiver because her grandson is moving back to [place] with his father. States that due to her own health problems, she is limited on what she is able to do. She also expressed concerns related to him discharging home with a lot of self-care needs. SW informed that is possible he has some self-care needs but unsure amount as he is recovering from stomach bug. SW also reiterated that if there are self-care needs we are unable to keep someone for this reason as we look at his functional progress; and medical team would not discharge if he is not ready. She is scheduled to come in for family edu on Monday (11/15) and Tuesday (11/16) 1pm-3pm along with [name]. SW informed there will continue to be updates on his care needs.   SW scheduled new patient appt/hospital follow-up with [name], NP on November 29 at 8:40am at [facility]. SW discussed above with pt.SW provided pt with HHA list so he can select his preference.   Cecile Sheerer, MSW, LCSWA Office: [phone] Cell: [phone] Fax: [phone]

## 2020-06-20 NOTE — Progress Notes (Signed)
Physical Therapy Wound Treatment Patient Details  Name: Collin Brown MRN: [id number] Date of Birth: 11/13/1985  Today's Date: 06/20/2020 PT Individual Time: 1020-1050 PT Individual Time Calculation (min): 30 min   Subjective  Subjective: Pt reports rough night last night Patient and Family Stated Goals: did not state Date of Onset:  (unknown ) Prior Treatments: dressing change  Pain Score:  4/10  Wound Assessment  Pressure Injury 06/08/20 Coccyx Unstageable - Full thickness tissue loss in which the base of the injury is covered by slough (yellow, tan, gray, green or Brown) and/or eschar (tan, Brown or black) in the wound bed. yellow necrotic tissue in the crease o (Active)  Dressing Type ABD;Barrier Film (skin prep);Gauze (Comment);Moist to dry 06/20/20 1319  Dressing Changed;Dry;Clean;Intact 06/20/20 1319  Dressing Change Frequency Daily 06/20/20 1319  State of Healing Eschar 06/20/20 1319  Site / Wound Assessment Red;Black;Pale;Pink 06/20/20 1319  % Wound base Red or Granulating 10% 06/19/20 1206  % Wound base Yellow/Fibrinous Exudate 0% 06/19/20 1206  % Wound base Black/Eschar 90% 06/19/20 1206  % Wound base Other/Granulation Tissue (Comment) 0% 06/19/20 1206  Peri-wound Assessment Erythema (blanchable);Excoriated 06/20/20 1319  Wound Length (cm) 3 cm 06/18/20 1303  Wound Width (cm) 1 cm 06/18/20 1303  Wound Depth (cm) 5 cm 06/18/20 1303  Wound Surface Area (cm^2) 3 cm^2 06/18/20 1303  Wound Volume (cm^3) 15 cm^3 06/18/20 1303  Margins Unattached edges (unapproximated) 06/20/20 1319  Drainage Amount Moderate 06/20/20 1319  Drainage Description Serosanguineous 06/20/20 1319  Treatment Debridement (Selective);Hydrotherapy (Pulse lavage);Packing (Saline gauze) 06/20/20 1319  Hydrotherapy Pulsed lavage therapy - wound location: sacrum Pulsed Lavage with Suction (psi): 12 psi Pulsed Lavage with Suction - Normal Saline Used: 1000 mL Pulsed Lavage Tip: Tip with splash  shield Selective Debridement Selective Debridement - Location: sacrum Selective Debridement - Tools Used: Forceps;Scalpel Selective Debridement - Tissue Removed: black eschar   Wound Assessment and Plan  Wound Therapy - Assess/Plan/Recommendations Wound Therapy - Clinical Statement: Increased serosangineous drainage noted today. Sharp debridement remains difficult in context of very small opening of wound. Pt will benefit from further hydrotherapy for selective debridement and in order to remove nonviable tissue. Wound Therapy - Functional Problem List: decreased mobility Factors Delaying/Impairing Wound Healing: Immobility Hydrotherapy Plan: Debridement;Patient/family education;Pulsatile lavage with suction Wound Therapy - Frequency: 6X / week Wound Therapy - Follow Up Recommendations: Home health RN Wound Plan: see above  Wound Therapy Goals- Improve the function of patient's integumentary system by progressing the wound(s) through the phases of wound healing (inflammation - proliferation - remodeling) by: Decrease Necrotic Tissue to: 50 Decrease Necrotic Tissue - Progress: Progressing toward goal Increase Granulation Tissue to: 50 Increase Granulation Tissue - Progress: Progressing toward goal Goals/treatment plan/discharge plan were made with and agreed upon by patient/family: Yes Time For Goal Achievement: 7 days Wound Therapy - Potential for Goals: Good  Goals will be updated until maximal potential achieved or discharge criteria met.  Discharge criteria: when goals achieved, discharge from hospital, MD decision/surgical intervention, no progress towards goals, refusal/missing three consecutive treatments without notification or medical reason.  GP     Brown, PT, DPT Acute Rehabilitation Services Pager [phone] Office [phone]   Collin Brown 06/20/2020, 1:22 PM    

## 2020-06-20 NOTE — Progress Notes (Signed)
Physical Therapy Session Note  Patient Details  Name: Collin Brown MRN: [id number] Date of Birth: [birth year]  Today's Date: 06/20/2020 PT Individual Time: 8889-1694 and 1300-1405 PT Individual Time Calculation (min): 75 in and 65 min   Short Term Goals: Week 2:  PT Short Term Goal 1 (Week 2): Patient will perform bed mobility with CGA without use of hospital bed functions. PT Short Term Goal 2 (Week 2): Patient will perform basic transfers with min A consitently. PT Short Term Goal 3 (Week 2): Patient will ambulate 100 ft using LRAD with mod A.  Skilled Therapeutic Interventions/Progress Updates:     Session 1: Patient in bed upon PT arrival. Patient alert and agreeable to PT session. Patient reported 7-8/10 sacral pain at wound site during session, RN made aware. PT provided repositioning, rest breaks, and distraction as pain interventions throughout session.   Patient reported increased anterior shoulder and upper arm pain since his L arm fell off the bed during rolling with nursing yesterday. Increased tenderness to palpation over affected area, pain diffuse. No increased GH joint laxity compared to R, negative for apprehension with shoulder abduction and ER. No change in shoulder or elbow ROM, however patient reports increased pain with all mobility today. Mild decreased grip strength noted this morning. Patient reports Collin Brown, [place] was aware and assessed the patient following the incident. Will continue to monitor for signs/symptoms of additional tissue injury.   Vitals:  Supine: BP 126/67, HR 99, SPO2 98% on 2L Sitting: BP 136/84, HR 100, SPO2 97% on 2L Patient remained on 2L/min O2 throughout session for improved activity tolerance, SPO2 >92% throughout.  Therapeutic Activity: Bed Mobility: Donned B thigh high TED hose and pants with total A bed level for energy conservation and time management. Performed rolling R/L with min A and use of bed rails to pull pants over hips with total  A. Patient performed supine to/from sit with min A with use of bed rails. Provided verbal cues for pushing up with his R elbow to sit up and management of his L arm throughout. Patient sat EOB with supervision for safety >8 min to monitor vitals and symptoms with upright tolerance due to decreased mobility for several days. Donned B tennis shoes and L upper extremity sling with patient sitting. Patient asymptomatic throughout.  Transfers: Patient performed sit to/from stand from an elevated bed with mod A +2 for boosting due to increased pain during first stand, then performed sit to from stand from the TIS w/c and stand pivot bed<>TIS w/c with mod-min A of 1. Provided verbal cues for forward weight shift, breath support with mobility, and controlled descent for safety.  Gait Training:  Patient ambulated 10 feet, limited by fatigue and moderate nausea, using Eva/Swedish walker with min A and +2 for close w/c follow. Ambulated with increased BOS, increased B toe out, decreased gait speed, step height, and step length, and increased lateral trunk sway. Provided verbal cues for paced breathing, increased step height for safety, and reduced BOS.  Wheelchair Mobility:  Patient was transported in the TIS w/c with total A throughout session for energy conservation and time management.  Patient in R side-lying for pressure relief in the bed supported by pillows at his back, between his legs, and under his L arm at end of session with breaks locked, bed alarm set, and all needs within reach.   MD rounded during session, dicussed switching bed to an air mattress for improved pressure relief due to patient being unable  to reposition in the bed independently, RN also made aware.   Session 2: Patient in bed upon PT arrival. Patient alert and agreeable to PT session. Patient reported 7-8/10 sacral pain and intermittent L arm pain during session, RN made aware. PT provided repositioning, rest breaks, and distraction  as pain interventions throughout session.   Therapeutic Activity: Bed Mobility: Patient performed supine to/from sit as above with increased effort this afternoon due to increased fatigue. Patient sat EOB x4 min without O2 for RA trial. Maintained SPO2 >93%, however, HR >120 on RA. Changed and reapplied [place], SPO2 increased to 98%, HR remained >120 and patient reported feeling "light headed" and requesting to return to lying. Vitals:  Sitting: BP 147/93 HR 121, SPO2 99% Lying: BP 127/80, HR 109, SPO2 97% Patient on 2L/min O2 throughout remainder of session with SPO2 >96%. Patient reported that symptoms resolved in lying. RN informed about patient's elevated HR and symptoms in sitting. Continued with bed level exercises to monitor patient's HR with low level activity. He performed rolling R/L during exercises x2 with min A with use of bed rails. Provided verbal cues for bring opposite knee across to complete rolling on his side and L upper extremity management throughout. Supported L arm with a pillow in side-lying.   Therapeutic Exercise: Patient performed the following exercises with verbal and tactile cues for proper technique. -B knee/hip extension with manual resistance x10, noted increased sacral pain with muscle spasm after performing exercise on the R -B side-lying SLR 2x10 for hip abductor strengthening -bridging 2x10 -B heel cord stretch 2x30 sec HR 109-115 bpm throughout exercises, provided adequate rest breaks and cued diaphragmatic breathing for improved breath support throughout.   Patient in R side-lying in the bed supported by pillows at his back, between his legs, and under his L arm at end of session with breaks locked, bed alarm set, and all needs within reach.    Therapy Documentation Precautions:  Precautions Precautions: Fall Precaution Comments: flaccid LUE (protect from subluxation); sling when OOB for comfort; skin integrity (sacral wound); monitor vitals Required  Braces or Orthoses: Sling Restrictions Weight Bearing Restrictions: No   Therapy/Group: Individual Therapy  [name] [name] PT, DPT  06/20/2020, 7:02 PM

## 2020-06-21 ENCOUNTER — Inpatient Hospital Stay (HOSPITAL_COMMUNITY): Payer: BC Managed Care – PPO

## 2020-06-21 ENCOUNTER — Inpatient Hospital Stay (HOSPITAL_COMMUNITY): Payer: BC Managed Care – PPO | Admitting: Occupational Therapy

## 2020-06-21 DIAGNOSIS — S143XXS Injury of brachial plexus, sequela: Secondary | ICD-10-CM | POA: Diagnosis not present

## 2020-06-21 DIAGNOSIS — G7281 Critical illness myopathy: Secondary | ICD-10-CM | POA: Diagnosis not present

## 2020-06-21 DIAGNOSIS — N390 Urinary tract infection, site not specified: Secondary | ICD-10-CM | POA: Diagnosis not present

## 2020-06-21 DIAGNOSIS — E1169 Type 2 diabetes mellitus with other specified complication: Secondary | ICD-10-CM | POA: Diagnosis not present

## 2020-06-21 LAB — GLUCOSE, CAPILLARY
Glucose-Capillary: 89 mg/dL (ref 70–99)
Glucose-Capillary: 125 mg/dL — ABNORMAL HIGH (ref 70–99)
Glucose-Capillary: 112 mg/dL — ABNORMAL HIGH (ref 70–99)
Glucose-Capillary: 109 mg/dL — ABNORMAL HIGH (ref 70–99)

## 2020-06-21 LAB — BASIC METABOLIC PANEL
Anion gap: 10 (ref 5–15)
BUN: <5 mg/dL — ABNORMAL LOW (ref 6–20)
CO2: 30 mmol/L (ref 22–32)
Calcium: 8.9 mg/dL (ref 8.9–10.3)
Chloride: 98 mmol/L (ref 98–111)
Creatinine, Ser: 0.51 mg/dL — ABNORMAL LOW (ref 0.61–1.24)
GFR, Estimated: >60 mL/min (ref >60)
Glucose, Bld: 97 mg/dL (ref 70–99)
Potassium: 3.4 mmol/L — ABNORMAL LOW (ref 3.5–5.1)
Sodium: 138 mmol/L (ref 135–145)

## 2020-06-21 MED ORDER — HYDROMORPHONE HCL 2 MG PO TABS
2.0000 mg | ORAL_TABLET | Freq: Every day | ORAL | Status: DC
  Administered 2020-06-21: 2 mg via ORAL
  Filled 2020-06-21: qty 1

## 2020-06-21 MED ORDER — HYDROMORPHONE HCL 2 MG PO TABS
4.0000 mg | ORAL_TABLET | ORAL | Status: DC
  Administered 2020-06-22 – 2020-07-12 (×16): 4 mg via ORAL
  Filled 2020-06-21 (×14): qty 2

## 2020-06-21 MED ORDER — HYDROMORPHONE HCL 2 MG PO TABS
2.0000 mg | ORAL_TABLET | Freq: Four times a day (QID) | ORAL | Status: DC | PRN
  Administered 2020-06-21 – 2020-07-03 (×28): 2 mg via ORAL
  Filled 2020-06-21 (×32): qty 1

## 2020-06-21 NOTE — Progress Notes (Addendum)
Physical Therapy Wound Treatment Patient Details  Name: JB DULWORTH MRN: [id number] Date of Birth: 1985-08-23  Today's Date: 06/21/2020 Time: 1004-1028 ( 24 min )     Subjective  Subjective: I want to stay on my side when we finish to get off of my bottom.   Patient and Family Stated Goals: to get on his side more to remove pressure from wound Date of Onset:  (unknown ) Prior Treatments: dressing change  Pain Score: Pain Score: 10-Worst pain ever  Wound Assessment  Pressure Injury 06/08/20 Coccyx Unstageable - Full thickness tissue loss in which the base of the injury is covered by slough (yellow, tan, gray, green or brown) and/or eschar (tan, brown or black) in the wound bed. yellow necrotic tissue in the crease o (Active)  Dressing Type ABD;Barrier Film (skin prep);Gauze (Comment);Moist to dry 06/21/20 1031  Dressing Changed;Dry;Intact 06/21/20 1031  Dressing Change Frequency Daily 06/21/20 1031  State of Healing Eschar 06/20/20 1319  Site / Wound Assessment Brown;Red;Black;Friable 06/21/20 1031  % Wound base Red or Granulating 10% 06/21/20 1031  % Wound base Yellow/Fibrinous Exudate 0% 06/19/20 1206  % Wound base Black/Eschar 90% 06/21/20 1031  % Wound base Other/Granulation Tissue (Comment) 0% 06/21/20 1031  Peri-wound Assessment Maceration;Intact 06/21/20 1031  Wound Length (cm) 3 cm 06/21/20 1013  Wound Width (cm) 1 cm 06/21/20 1013  Wound Depth (cm) 5 cm 06/21/20 1013  Wound Surface Area (cm^2) 3 cm^2 06/21/20 1013  Wound Volume (cm^3) 15 cm^3 06/21/20 1013  Margins Unattached edges (unapproximated) 06/21/20 1031  Drainage Amount Moderate 06/21/20 1031  Drainage Description Serosanguineous 06/21/20 1031  Treatment Debridement (Selective);Hydrotherapy (Pulse lavage) 06/21/20 1031     Santyl applied to wound bed prior to applying dressing.  Hydrotherapy Pulsed lavage therapy - wound location: sacrum Pulsed Lavage with Suction (psi): 12 psi Pulsed Lavage with  Suction - Normal Saline Used: 1000 mL Pulsed Lavage Tip: Tip with splash shield Selective Debridement Selective Debridement - Location: sacrum Selective Debridement - Tools Used: Forceps;Scalpel (difficult to assess wound bed due to small opening,  ) Selective Debridement - Tissue Removed: black eschar   Wound Assessment and Plan  Wound Therapy - Assess/Plan/Recommendations Wound Therapy - Clinical Statement: Increased serosangineous drainage noted today. Sharp debridement remains difficult in context of very small opening of wound. Pt will benefit from further hydrotherapy for selective debridement and in order to remove nonviable tissue.  Pt very painful and would benefit from stronger pain meds to allow for more aggressive debridement.  May be worth surgery consult to open wound entrance to allow for more thorough debridement. Wound Therapy - Functional Problem List: decreased mobility Factors Delaying/Impairing Wound Healing: Immobility Hydrotherapy Plan: Debridement;Patient/family education;Pulsatile lavage with suction Wound Therapy - Frequency: 6X / week Wound Therapy - Follow Up Recommendations: Home health RN Wound Plan: see above  Wound Therapy Goals- Improve the function of patient's integumentary system by progressing the wound(s) through the phases of wound healing (inflammation - proliferation - remodeling) by: Decrease Necrotic Tissue to: 50 Decrease Necrotic Tissue - Progress: Progressing toward goal Increase Granulation Tissue to: 50 Increase Granulation Tissue - Progress: Progressing toward goal Goals/treatment plan/discharge plan were made with and agreed upon by patient/family: Yes Time For Goal Achievement: 7 days Wound Therapy - Potential for Goals: Good  Goals will be updated until maximal potential achieved or discharge criteria met.  Discharge criteria: when goals achieved, discharge from hospital, MD decision/surgical intervention, no progress towards goals,  refusal/missing three consecutive treatments without notification or  medical reason.  GP     Ariellah Faust Eli Hose 06/21/2020, 10:40 AM  Erasmo Leventhal , PTA Acute Rehabilitation Services Pager [phone] Office [phone]

## 2020-06-21 NOTE — Progress Notes (Signed)
[place] PHYSICAL MEDICINE & REHABILITATION PROGRESS NOTE   Subjective/Complaints: Increased pain in sacral area. Has a hard time with sitting and with any movement and wound care.  ROS: Patient denies fever, rash, sore throat, blurred vision, nausea, vomiting, diarrhea, cough, shortness of breath or chest pain.  Objective:   No results found. Recent Labs    06/20/20 0418  WBC 6.0  HGB 10.9*  HCT 34.7*  PLT 256   Recent Labs    06/20/20 0418 06/21/20 0644  NA 138 138  K 3.1* 3.4*  CL 97* 98  CO2 30 30  GLUCOSE 102* 97  BUN 6 <5*  CREATININE 0.51* 0.51*  CALCIUM 8.8* 8.9    Intake/Output Summary (Last 24 hours) at 06/21/2020 1000 Last data filed at 06/20/2020 2100 Gross per 24 hour  Intake 360 ml  Output 1675 ml  Net -1315 ml     Pressure Injury 06/08/20 Coccyx Unstageable - Full thickness tissue loss in which the base of the injury is covered by slough (yellow, tan, gray, green or brown) and/or eschar (tan, brown or black) in the wound bed. yellow necrotic tissue in the crease o (Active)  06/08/20 1830 (with Lizabeth Leyden RN)  Location: Coccyx  Location Orientation:   Staging: Unstageable - Full thickness tissue loss in which the base of the injury is covered by slough (yellow, tan, gray, green or brown) and/or eschar (tan, brown or black) in the wound bed.  Wound Description (Comments): yellow necrotic tissue in the crease originally put in as a nonpressure wound  Present on Admission: Yes    Physical Exam: Vital Signs Blood pressure 114/64, pulse 97, temperature 97.6 F (36.4 C), temperature source Oral, resp. rate 20, height 5\' 10"  (1.778 m), weight (!) 155 kg, SpO2 93 %.  Constitutional: appears uncomfortable. Vital signs reviewed. HEENT: EOMI, oral membranes moist Neck: supple Cardiovascular: RRR without murmur. No JVD    Respiratory/Chest: CTA Bilaterally without wheezes or rales. Normal effort    GI/Abdomen: BS +, non-tender, non-distended Ext:  no clubbing, cyanosis, or edema Psych: flat Skin: eschar on toes, stable. Sacral wound with increased granulation. Sanguinous drainage. Buttocks very sensitive to touch  Neuro: Pt is cognitively appropriate with normal insight, memory, and awareness. Cranial nerves 2-12 are intact. DTR's 1+.  Fine motor coordination is intact. No tremors. Motor function is grossly 5/5 RUE. LUE with ABD/EE,WE weakness ongoing  Musculoskeletal: Full ROM, No pain with AROM or PROM in the neck, trunk, or LE's.  Posture appropriate       Assessment/Plan: 1. 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  Physiatrist is providing close team supervision and 24 hour management of active medical problems listed below.  Physiatrist and rehab team continue to assess barriers to discharge/monitor patient progress toward functional and medical goals  Care Tool:  Bathing    Body parts bathed by patient: Chest, Abdomen, Front perineal area, Right upper leg, Left upper leg, Face   Body parts bathed by helper: Right arm, Left arm, Buttocks, Left lower leg, Right lower leg     Bathing assist Assist Level: 2 Helpers     Upper Body Dressing/Undressing Upper body dressing   What is the patient wearing?: Hospital gown only    Upper body assist Assist Level: Moderate Assistance - Patient 50 - 74%    Lower Body Dressing/Undressing Lower body dressing      What is the patient  wearing?: Incontinence brief     Lower body assist Assist for lower body dressing: 2 Helpers     Toileting Toileting    Toileting assist Assist for toileting: Minimal Assistance - Patient > 75% (Assist the pt with the urinal)     Transfers Chair/bed transfer  Transfers assist  Chair/bed transfer activity did not occur: Safety/medical concerns  Chair/bed transfer assist level: Minimal Assistance - Patient >  75% Chair/bed transfer assistive device: Armrests   Locomotion Ambulation   Ambulation assist   Ambulation activity did not occur: Safety/medical concerns  Assist level: Minimal Assistance - Patient > 75% Assistive device: Walker-Eva Max distance: 10 ft   Walk 10 feet activity   Assist  Walk 10 feet activity did not occur: Safety/medical concerns  Assist level: Minimal Assistance - Patient > 75% Assistive device: Walker-Eva   Walk 50 feet activity   Assist Walk 50 feet with 2 turns activity did not occur: Safety/medical concerns (did not perform turns)  Assist level: 2 helpers Assistive device: Walker-Eva    Walk 150 feet activity   Assist Walk 150 feet activity did not occur: Safety/medical concerns         Walk 10 feet on uneven surface  activity   Assist Walk 10 feet on uneven surfaces activity did not occur: Safety/medical concerns         Wheelchair     Assist Will patient use wheelchair at discharge?: No   Wheelchair activity did not occur: Safety/medical concerns         Wheelchair 50 feet with 2 turns activity    Assist    Wheelchair 50 feet with 2 turns activity did not occur: Safety/medical concerns       Wheelchair 150 feet activity     Assist  Wheelchair 150 feet activity did not occur: Safety/medical concerns       Blood pressure 114/64, pulse 97, temperature 97.6 F (36.4 C), temperature source Oral, resp. rate 20, height 5\' 10"  (1.778 m), weight (!) 155 kg, SpO2 93 %.  Medical Problem List and Plan: 1.  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             - continue therapies as tolerated. Trying to work through pain related to wounds.   -spoke at length with Matt's mother this morning as well.  2.   Antithrombotics: -DVT/anticoagulation: Lovenox 85 mg daily.               -antiplatelet therapy: Aspirin 81 mg daily 3. Pain Management: Advil as needed             Oxycodone as needed             11/5: pain improved with 400mg  tid of gabapentin  11/6-9: stable  11/11: gabapentin, kpad   -add dilaudid today for sacral/wound pain.   -can use prn and 30" prior to hydrotherapy 4. Mood: Provide emotional support             -antipsychotic agents: N/A 5. Neuropsych: This patient is capable of making decisions on his own behalf. 6. 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 Wound is improving. Suspect he's having more  pain likely d/t more viable tissue in area  -spoke with surgery who will see only if an infection or worsening is suspected.   -Continue pressure relief, nutrition 7. Fluids/Electrolytes/Nutrition: Routine in and outs.  CMP ordered. 8.  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  Patient completed 5-day course Solu-Medrol             Supportive care with range of motion             Recommend NCV/EMG as outpatient.  -increased gabapentin as above 9.  New onset diabetes mellitus, hemoglobin A1c 7.2.  NovoLog 4 units 3 times daily, Levemir 15 units twice daily.               CBG (last 3)  Recent Labs    06/20/20 1950 06/20/20 2112 06/21/20 0615  GLUCAP 125* 122* 89   -11/11: CBGs well controlled 10.  Hypertension with tachycardia.  Lopressor 50 mg twice daily.               10/31 pt hypotensive yesterday with OT which wasn't unexpected   -TEDS, Abd binder   -push po fluids   -continue to acclimate, get OOB   -reassured him that this is to be expected   -continue to monitor  11/3: Increased Lopressor to 75mg  BID  11/11 bp  controlled today 11.  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  Dietary follow-up 12. Fever, UCX with 100 Klebsiella  -stool sample also positive for salmonella  -chest xray and  exam unremarkable  -blood cultures pending but negative so far  -abx narrowed to augmentin per pharmacy note  -encourage PO  -appreciate family medicine assist!   54. Hypokalemia: K+ 3.1 on 11/10: supplement Kdur 13/10 BID and repeat tomorrow.   -11/11 k+ 3.4   -continue supplementation      LOS: 13 days A FACE TO FACE EVALUATION WAS PERFORMED  13/11 06/21/2020, 10:00 AM

## 2020-06-21 NOTE — Progress Notes (Addendum)
Occupational Therapy Session Note  Patient Details  Name: FINDLEY VI MRN: [id number] Date of Birth: March 06, 1986  Today's Date: 06/21/2020 OT Individual Time: 7741-4239 OT Individual Time Calculation (min): 43 min    Short Term Goals: Week 2:  OT Short Term Goal 1 (Week 2): Pt will don shirt with min A OT Short Term Goal 2 (Week 2): Pt will complete stand pivot transfer to the [facility] with min A OT Short Term Goal 3 (Week 2): Pt will don LB clothing with mod A and LRAD  Skilled Therapeutic Interventions/Progress Updates:  Patient met lying supine in bed reporting 10/10 pain in buttocks. RN present to administer meds and MD present for first 20 minutes of OT session. Patient in agreement with bathing at EOB 2/2 pain. Attempted transfer from supine to EOB with HOB elevated but pain limiting function. Attempted to bathe/dress UB in supine but patient unable to transition from supine to long-sitting to remove shirt 2/2 pain requesting to terminate session. Patient declined grooming tasks at bed level. Session concluded with patient lying supine in bed with call bell within reach, bed alarm activated, and all needs met. Patient missed 17 minutes of skilled OT treatment session.   Therapy Documentation Precautions:  Precautions Precautions: Fall Precaution Comments: flaccid LUE (protect from subluxation); sling when OOB for comfort; skin integrity (sacral wound); monitor vitals Required Braces or Orthoses: Sling Restrictions Weight Bearing Restrictions: No General: General OT Amount of Missed Time: 17 Minutes  Therapy/Group: Individual Therapy  Carles Florea R Howerton-Davis 06/21/2020, 7:28 AM

## 2020-06-21 NOTE — Progress Notes (Signed)
Patient ID: Collin Brown, male   DOB: 1986/05/05, 34 y.o.   MRN: [id number]  SW to follow-up with pt tomorrow to discuss HHA preference. SW to follow-up.   Cecile Sheerer, MSW, LCSWA Office: [phone] Cell: [phone] Fax: [phone]

## 2020-06-21 NOTE — Progress Notes (Signed)
Physical Therapy Session Note  Patient Details  Name: Collin Brown MRN: [id number] Date of Birth: Feb 11, 1986  Today's Date: 06/21/2020 PT Individual Time: 1415-1515 and 1110-1200 PT Individual Time Calculation (min): 60 min and 50 min  Short Term Goals: Week 1:  PT Short Term Goal 1 (Week 1): Pt will perform bed mobility with maxA +1 person PT Short Term Goal 1 - Progress (Week 1): Met PT Short Term Goal 2 (Week 1): Pt will perform bed<>chair transfers with maxA and LRAD PT Short Term Goal 2 - Progress (Week 1): Met PT Short Term Goal 3 (Week 1): Pt will tolerate sitting in chair/recliner for >1 hour with stable vital signs PT Short Term Goal 3 - Progress (Week 1): Met PT Short Term Goal 4 (Week 1): Pt will begin initiating pre-gait training with maxA and LRAD PT Short Term Goal 4 - Progress (Week 1): Met Week 2:  PT Short Term Goal 1 (Week 2): Patient will perform bed mobility with CGA without use of hospital bed functions. PT Short Term Goal 2 (Week 2): Patient will perform basic transfers with min A consitently. PT Short Term Goal 3 (Week 2): Patient will ambulate 100 ft using LRAD with mod A. Week 3:     Skilled Therapeutic Interventions/Progress Updates:    AM SESSION PAIN c/o pain at site of wound on bottom which increases especially sit to stand, did not give number, care taken w/mobility, pt instructed w/deep breathing, additional time for recovery provided as needed.  Pt initially sidelying and initially hesistant but after discussing benefits of mobility, pt agrees to participate. Requests use of urinal, dependent for use of urinal.  Side to sit w/mod assist of 2, additional time, cues for deep breathing once sitting due to pain.  Pt sat 6-7 min, mild dizzyness initially, pain subsides w/rest.  Clean brief and pants donned w/total assist. Attempted Sit to stand mult efforts, pt defaults due to pain 3-4times before successfully achieving upright w/min assist of 2 to swedish  walker. Dependent for raising brief/pants.  Gait 38f w/min assist of 2, wc follow.  Stand to sit w/min assist.  Verbal cues for paced breathing, deep breathing for recovery.  HR 120, 02sats 97%, fairly quickly HR decreased to 106. STS and gait as above returning to bed/turn/sit to bed w/swedish walker and min assist of 2. Recovers several min in sitting Sit to spine max assist of 2. Repositioned in r sidelying w/pillows positioned for support. Pt left sidelying w/rails up x 3, alarm set, bed in lowest position, and needs in reach  PM SESSION PAIN  See above note, no change in pain or intervention from am session with exception of use of music as distraction which proved very helpful w/mobility tolerance.  Pt initially sidelying and agreeable to session.  Appears slightly giddy but awake and alert. Side to sit w/max assist and cues for sequencing.  Pt sits several min due to increased pain w/transition.  Attempted Sit to stand from elevated bed x 6 efforts w/prolonged recovery between efforts, pt defaults and states he is having difficulty due to anticipation of pain.  Suggested use of music as distraction and pt agreed/chose music on his phone/his preference.  Pt then able to perform Sit to stand w/mod assist of 2 from bed, gait x 110fw/min assist of 2, wc follow, cues for posture.  Sit to supine w/max assist of 2 and cues.  Repositioned in r sidelying w/pillows for support/comfort. Pt left sidelying w/rails up x  3, NT in room for vitals, alarm set, bed in lowest position, and needs in reach.  Therapy Documentation Precautions:  Precautions Precautions: Fall Precaution Comments: flaccid LUE (protect from subluxation); sling when OOB for comfort; skin integrity (sacral wound); monitor vitals Required Braces or Orthoses: Sling Restrictions Weight Bearing Restrictions: No    Therapy/Group: Individual Therapy   , PT    M  06/21/2020, 3:32 PM  

## 2020-06-22 ENCOUNTER — Inpatient Hospital Stay (HOSPITAL_COMMUNITY): Payer: BC Managed Care – PPO | Admitting: Occupational Therapy

## 2020-06-22 ENCOUNTER — Inpatient Hospital Stay (HOSPITAL_COMMUNITY): Payer: BC Managed Care – PPO

## 2020-06-22 LAB — GLUCOSE, CAPILLARY
Glucose-Capillary: 123 mg/dL — ABNORMAL HIGH (ref 70–99)
Glucose-Capillary: 100 mg/dL — ABNORMAL HIGH (ref 70–99)
Glucose-Capillary: 68 mg/dL — ABNORMAL LOW (ref 70–99)
Glucose-Capillary: 121 mg/dL — ABNORMAL HIGH (ref 70–99)
Glucose-Capillary: 146 mg/dL — ABNORMAL HIGH (ref 70–99)

## 2020-06-22 LAB — CULTURE, BLOOD (ROUTINE X 2)
Culture: NO GROWTH
Culture: NO GROWTH
Special Requests: ADEQUATE
Special Requests: ADEQUATE

## 2020-06-22 MED ORDER — POLYETHYLENE GLYCOL 3350 17 G PO PACK
17.0000 g | PACK | Freq: Once | ORAL | Status: AC
  Administered 2020-06-22: 17 g via ORAL
  Filled 2020-06-22: qty 1

## 2020-06-22 NOTE — Progress Notes (Signed)
Patient ID: Collin Brown, male   DOB: 1985-08-14, 34 y.o.   MRN: [id number]  SW met with pt in room to follow-up about HHA preference. No HHA preference.   SW sent HHPT/OT/SN referral to [facility] and waiting on follow-up.     Loralee Pacas, MSW, [employer] Office: [phone] Cell: [phone] Fax: [phone]

## 2020-06-22 NOTE — Plan of Care (Signed)
  Problem: Consults Goal: RH GENERAL PATIENT EDUCATION Description: See Patient Education module for education specifics. Outcome: Progressing Goal: Skin Care Protocol Initiated - if Braden Score 18 or less Description: If consults are not indicated, leave blank or document N/A Outcome: Progressing Goal: Nutrition Consult-if indicated Outcome: Progressing Goal: Diabetes Guidelines if Diabetic/Glucose > 140 Description: If diabetic or lab glucose is > 140 mg/dl - Initiate Diabetes/Hyperglycemia Guidelines & Document Interventions  Outcome: Progressing   Problem: RH BOWEL ELIMINATION Goal: RH STG MANAGE BOWEL WITH ASSISTANCE Description: STG Manage Bowel with  mod Assistance. Outcome: Progressing Goal: RH STG MANAGE BOWEL W/MEDICATION W/ASSISTANCE Description: STG Manage Bowel with Medication with supervision Assistance. Outcome: Progressing   Problem: RH SKIN INTEGRITY Goal: RH STG SKIN FREE OF INFECTION/BREAKDOWN Description: Skin to remain free from additional breakdown while on rehab with supervision assistance. Outcome: Progressing Goal: RH STG MAINTAIN SKIN INTEGRITY WITH ASSISTANCE Description: STG Maintain Skin Integrity With  mod I Assistance. Outcome: Progressing Goal: RH STG ABLE TO PERFORM INCISION/WOUND CARE W/ASSISTANCE Description: STG Able To Perform Incision/Wound Care With mod I Assistance. Outcome: Progressing   Problem: RH SAFETY Goal: RH STG ADHERE TO SAFETY PRECAUTIONS W/ASSISTANCE/DEVICE Description: STG Adhere to Safety Precautions With  mod I Assistance/Device. Outcome: Progressing   Problem: RH PAIN MANAGEMENT Goal: RH STG PAIN MANAGED AT OR BELOW PT'S PAIN GOAL Description: Pain free and <3  Outcome: Progressing   

## 2020-06-22 NOTE — Progress Notes (Signed)
Physical Therapy Wound Treatment Patient Details  Name: Collin Brown MRN: [id number] Date of Birth: May 26, 1986  Today's Date: 06/22/2020 PT Individual Time: 1002-1025 PT Individual Time Calculation (min): 23 min   Subjective  Subjective: Pt reports less pain with new pain medication Patient and Family Stated Goals: to get on his side more to remove pressure from wound Date of Onset:  (unknown ) Prior Treatments: dressing change  Pain Score: Pain Score: 8   Wound Assessment  Pressure Injury 06/08/20 Coccyx Unstageable - Full thickness tissue loss in which the base of the injury is covered by slough (yellow, tan, gray, green or brown) and/or eschar (tan, brown or black) in the wound bed. yellow necrotic tissue in the crease o (Active)  Wound Image   06/18/20 1303  Dressing Type ABD 06/22/20 1300  Dressing Changed 06/22/20 1300  Dressing Change Frequency Daily 06/22/20 1300  State of Healing Eschar 06/22/20 1300  Site / Wound Assessment Yellow;Bleeding;Pink;Brown 06/22/20 1300  % Wound base Red or Granulating 5% 06/22/20 1300  % Wound base Yellow/Fibrinous Exudate 0% 06/22/20 1300  % Wound base Black/Eschar 90% 06/22/20 1300  % Wound base Other/Granulation Tissue (Comment) 0% 06/22/20 1300  Peri-wound Assessment Maceration;Pink 06/22/20 1300  Wound Length (cm) 3 cm 06/21/20 1013  Wound Width (cm) 1 cm 06/21/20 1013  Wound Depth (cm) 5 cm 06/21/20 1013  Wound Surface Area (cm^2) 3 cm^2 06/21/20 1013  Wound Volume (cm^3) 15 cm^3 06/21/20 1013  Margins Unattached edges (unapproximated) 06/22/20 1300  Drainage Amount Moderate 06/22/20 1300  Drainage Description Serosanguineous 06/22/20 1300  Treatment Debridement (Selective);Hydrotherapy (Pulse lavage);Packing (Saline gauze) 06/22/20 1300   Santyl applied to necrotic tissue               Hydrotherapy Pulsed lavage therapy - wound location: sacrum Pulsed Lavage with Suction (psi): 12 psi Pulsed Lavage with Suction - Normal  Saline Used: 1000 mL Pulsed Lavage Tip: Tip with splash shield Selective Debridement Selective Debridement - Location: sacrum Selective Debridement - Tools Used: Forceps;Scalpel (difficult to assess wound bed due to small opening,  ) Selective Debridement - Tissue Removed: brown necrotic tissue   Wound Assessment and Plan  Wound Therapy - Assess/Plan/Recommendations Wound Therapy - Clinical Statement: Bleeding from wound edges with removal of packing. Continues to be very difficult to perform selective debridement given the very narrow opening to the wound bed. Pt will continue to benefit from hydrotherapy and selective debridement to reduce bioburden and promote wound healing. I Wound Therapy - Functional Problem List: decreased mobility Factors Delaying/Impairing Wound Healing: Immobility Hydrotherapy Plan: Debridement;Patient/family education;Pulsatile lavage with suction Wound Therapy - Frequency: 6X / week Wound Therapy - Follow Up Recommendations: Home health RN Wound Plan: see above  Wound Therapy Goals- Improve the function of patient's integumentary system by progressing the wound(s) through the phases of wound healing (inflammation - proliferation - remodeling) by: Decrease Necrotic Tissue to: 50 Decrease Necrotic Tissue - Progress: Progressing toward goal Increase Granulation Tissue to: 50 Increase Granulation Tissue - Progress: Progressing toward goal Goals/treatment plan/discharge plan were made with and agreed upon by patient/family: Yes Time For Goal Achievement: 7 days Wound Therapy - Potential for Goals: Good  Goals will be updated until maximal potential achieved or discharge criteria met.  Discharge criteria: when goals achieved, discharge from hospital, MD decision/surgical intervention, no progress towards goals, refusal/missing three consecutive treatments without notification or medical reason.  GP    Dani Gobble. Migdalia Dk PT, DPT Acute Rehabilitation  Services Pager [phone]  Office [phone]  [place] 06/22/2020, 1:19 PM

## 2020-06-22 NOTE — Significant Event (Signed)
Hypoglycemic Event  CBG: 68  Treatment: 4 oz juice/soda  Symptoms: patient felt sluggish.  Follow-up CBG: Time:1744 CBG Result:121  Possible Reasons for Event: Change in activity  Comments/MD notified:Dr. Lovorn notified. Patient consumed 4 oz. Juice and then dinner tray arrived to the room.    [name], Suzi Roots

## 2020-06-22 NOTE — Progress Notes (Signed)
Physical Therapy Session Note  Patient Details  Name: Collin Brown MRN: [id number] Date of Birth: 03-Sep-1985  Today's Date: 06/22/2020 PT Individual Time: 1100-1200 and 1345-1445 PT Individual Time Calculation (min): 60 min and 60 min  Short Term Goals: Week 1:  PT Short Term Goal 1 (Week 1): Pt will perform bed mobility with maxA +1 person PT Short Term Goal 1 - Progress (Week 1): Met PT Short Term Goal 2 (Week 1): Pt will perform bed<>chair transfers with maxA and LRAD PT Short Term Goal 2 - Progress (Week 1): Met PT Short Term Goal 3 (Week 1): Pt will tolerate sitting in chair/recliner for >1 hour with stable vital signs PT Short Term Goal 3 - Progress (Week 1): Met PT Short Term Goal 4 (Week 1): Pt will begin initiating pre-gait training with maxA and LRAD PT Short Term Goal 4 - Progress (Week 1): Met Week 2:  PT Short Term Goal 1 (Week 2): Patient will perform bed mobility with CGA without use of hospital bed functions. PT Short Term Goal 2 (Week 2): Patient will perform basic transfers with min A consitently. PT Short Term Goal 3 (Week 2): Patient will ambulate 100 ft using LRAD with mod A. Week 3:     Skilled Therapeutic Interventions/Progress Updates:   Am Session Pt on 2L 02 throughout session. PAIN 3/10 - increased to 7/10 w/sitting in wc > 6/46mn between gait trials.  Sitting limited, treatment to tolerance.  Pt initially waking from nap, agreeale to session.  States pain is better controlled w/new meds.  Side to sit w/mod assist and verbal cues for sequencing/encouragment,  strenuous for pt, additional time. .  Sits several min while fully awakening. Sit to stand w/bed elevated and min assist of 2. To swedish walker. Gait trials: 1269fx 1 w/min assist of 1, second person assist for wc follow and 02 management, min cues for posture, good cadence today. Seated rest x 6-7 min, repositioned in wc for comfort, pt increasingly uncomfortable w/increased pain at wound site  w/prolonged sitting. 14076f 1 w/assist as above.   Pt transported back to room, discomfort increases w/sitting. Sit to stand w/min to mod assist from wc, gait 70f22f head of bed, turn/sit w/cues and min assist. Sit to side to supine w/mod assist of 1. Pt scoots in sidelying w/mod assist, rolls fully to R w/cues, positioned comfortably in s/l to decrease pressure on wound sit, left w/rails up x 3, alarm set, bed in lowest position, and needs in reach.  Significantly improved ability to tolerate functional mobility today w/much better pain control.  Pm session Pt on 2L 02 throughout session  PAIN continues to state pain is much better controlled, pain primarily when sitting for several min at a time, minimized sitting time in session, treatment to tolerance  Pt sidelying and agreeable to session.   Sidelying to sit w/mod assist and cues for sequencing, strenuous for pt, additional time Pt sits several min, initially c/o dizzyness which resolved fairly quickly.  Seated LAQs, ankle pumps x 20 each as warm up activity. Sit to stand from elevated bed w/mod to min assist to [place].  Stood x 1 min w/cga.  Gait: 167ft34f w/min assist w/swedish walker, w/fatigue wobbles develop L knee at loading but no buckling, good posture during gait.  167ft 19fn assist as above, turn/sit to edge of bed w/min assist.  Pt very pleased w/distance achieved this session.    Seated therex: LUE AAROM shoulder flexion (  to tolerance, pain at approx 80*)/extension, horizontal abd/add, IR/ER, wrist flexion AROM/prom extension, forearm pronation/PROM supination, elbow extension/PROM flexion.  LE marching 2x10, hip abd/add 2x10 Pt c/o increasing discomfort in sitting. Sit to side to supine w/mod assist.  Scoots to center of bed w/mod assist to prevent shearing.  Supine to R side w/rail and min assist.  sidelying clamshells x 20 each. Pt repositioned comfortably and for optimal pressure relief to sacral  sore. Pt left sidelying w/rails up x 3, alarm set, bed in lowest position, and needs in reach.  Pt appears much more comfortable w/decreased c/o pain w/mobility.  Endurance much improved and gait distances not limited by SOB but more by LE fatigue.  Therapy Documentation Precautions:  Precautions Precautions: Fall Precaution Comments: flaccid LUE (protect from subluxation); sling when OOB for comfort; skin integrity (sacral wound); monitor vitals Required Braces or Orthoses: Sling Restrictions Weight Bearing Restrictions: No    Therapy/Group: Individual Therapy  Callie Fielding, [place] 06/22/2020, 12:26 PM

## 2020-06-22 NOTE — Progress Notes (Signed)
Occupational Therapy Session Note  Patient Details  Name: Collin Brown MRN: [id number] Date of Birth: 26-Sep-1985  Today's Date: 06/22/2020 OT Individual Time: 7505-1833 OT Individual Time Calculation (min): 57 min    Short Term Goals: Week 2:  OT Short Term Goal 1 (Week 2): Pt will don shirt with min A OT Short Term Goal 2 (Week 2): Pt will complete stand pivot transfer to the [facility] with min A OT Short Term Goal 3 (Week 2): Pt will don LB clothing with mod A and LRAD  Skilled Therapeutic Interventions/Progress Updates:  Patient met lying supine in bed in agreement with OT treatment session. 8/10 pain in buttocks (premedicated). Bed mobility with Mod A +2 for rolling R<>L to adjust brief. OT assisted patient with threading BLE into pants. Attempted bridging in supine but patient unable to clear hips enough from bed surface to hike pants over hips. Supine to R side-lying with use of chuck pad and side-lying to sitting EOB with +2 assist for patient/therapist safety. Seated EOB, patient able to doff UB clothing with Mod A and wash chest, arms, and abdomen with assist for set-up and cues for thoroughness. Assist to don LUE sling with education on leaving velcro in place and doffing over head to increase independence. Patient hesitant to stand from EOB without +2 assist although patient able to perform sit to stand transfers with multiple attempts and +1 assist earlier this week. Sit to stand from EOB with Mod A +1 and +1 standby for patient comfort. Assist to hike pants over hips in standing with hand held assist. Return to supine with assist to advance LLE from EOB to bed level. Session concluded with patient in R side-lying with pillows supporting, call bell within reach, and all needs met.   Therapy Documentation Precautions:  Precautions Precautions: Fall Precaution Comments: flaccid LUE (protect from subluxation); sling when OOB for comfort; skin integrity (sacral wound); monitor  vitals Required Braces or Orthoses: Sling Restrictions Weight Bearing Restrictions: No General:    Therapy/Group: Individual Therapy  Collin Brown 06/22/2020, 7:21 AM

## 2020-06-22 NOTE — Progress Notes (Signed)
[place] PHYSICAL MEDICINE & REHABILITATION PROGRESS NOTE   Subjective/Complaints: Pt up at EOB with OT. Still having a lot of pain from sacral wound, but seems to be tolerating sitting better than yesterday  ROS: Patient denies fever, rash, sore throat, blurred vision, nausea, vomiting, diarrhea, cough, shortness of breath or chest pain,   headache, or mood change.     No results found. Recent Labs    06/20/20 0418  WBC 6.0  HGB 10.9*  HCT 34.7*  PLT 256   Recent Labs    06/20/20 0418 06/21/20 0644  NA 138 138  K 3.1* 3.4*  CL 97* 98  CO2 30 30  GLUCOSE 102* 97  BUN 6 <5*  CREATININE 0.51* 0.51*  CALCIUM 8.8* 8.9    Intake/Output Summary (Last 24 hours) at 06/22/2020 1122 Last data filed at 06/21/2020 2045 Gross per 24 hour  Intake 237 ml  Output 1400 ml  Net -1163 ml     Pressure Injury 06/08/20 Coccyx Unstageable - Full thickness tissue loss in which the base of the injury is covered by slough (yellow, tan, gray, green or brown) and/or eschar (tan, brown or black) in the wound bed. yellow necrotic tissue in the crease o (Active)  06/08/20 1830 (with Lizabeth Leyden RN)  Location: Coccyx  Location Orientation:   Staging: Unstageable - Full thickness tissue loss in which the base of the injury is covered by slough (yellow, tan, gray, green or brown) and/or eschar (tan, brown or black) in the wound bed.  Wound Description (Comments): yellow necrotic tissue in the crease originally put in as a nonpressure wound  Present on Admission: Yes    Physical Exam: Vital Signs Blood pressure 121/65, pulse (!) 101, temperature 98.4 F (36.9 C), temperature source Oral, resp. rate 20, height 5\' 10"  (1.778 m), weight (!) 155.2 kg, SpO2 93 %.  Constitutional: No distress . Vital signs reviewed. HEENT: EOMI, oral membranes moist Neck: supple Cardiovascular: RRR without murmur. No JVD    Respiratory/Chest: CTA Bilaterally without wheezes or rales. Normal effort     GI/Abdomen: BS +, non-tender, non-distended Ext: no clubbing, cyanosis, or edema Psych: pleasant and cooperative Skin: eschar on toes, improving.  Sacral wound with increasing granulation tissue although full depth of wound difficult to see, s/s drainage. Neuro: Pt is cognitively appropriate with normal insight, memory, and awareness. Cranial nerves 2-12 are intact. DTR's 1+.  Fine motor coordination is intact. No tremors. Motor function is grossly 5/5 RUE. LUE with ABD/EE,WE weakness ongoing  Musculoskeletal: Full ROM, No pain with AROM or PROM in the neck, trunk, or LE's.  Posture appropriate       Assessment/Plan: 1. 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  Physiatrist is providing close team supervision and 24 hour management of active medical problems listed below.  Physiatrist and rehab team continue to assess barriers to discharge/monitor patient progress toward functional and medical goals  Care Tool:  Bathing    Body parts bathed by patient: Chest, Abdomen, Front perineal area, Right upper leg, Left upper leg, Face   Body parts bathed by helper: Right arm, Left arm, Buttocks, Left lower leg, Right lower leg     Bathing assist Assist Level: 2 Helpers     Upper Body Dressing/Undressing Upper body dressing   What is the patient wearing?: Hospital gown only    Upper body assist Assist Level: Moderate Assistance - Patient 50 -  74%    Lower Body Dressing/Undressing Lower body dressing      What is the patient wearing?: Incontinence brief     Lower body assist Assist for lower body dressing: 2 Helpers     Toileting Toileting    Toileting assist Assist for toileting: Minimal Assistance - Patient > 75% (Assist the pt with the urinal)     Transfers Chair/bed transfer  Transfers assist  Chair/bed transfer activity did not occur:  Safety/medical concerns  Chair/bed transfer assist level: 2 Helpers Chair/bed transfer assistive device: Arboriculturist assist   Ambulation activity did not occur: Safety/medical concerns  Assist level: 2 helpers Assistive device: Walker-Eva Max distance: 12   Walk 10 feet activity   Assist  Walk 10 feet activity did not occur: Safety/medical concerns  Assist level: 2 helpers Assistive device: [employer] 50 feet activity   Assist Walk 50 feet with 2 turns activity did not occur: Safety/medical concerns (did not perform turns)  Assist level: 2 helpers Assistive device: [employer] 150 feet activity   Assist Walk 150 feet activity did not occur: Safety/medical concerns         Walk 10 feet on uneven surface  activity   Assist Walk 10 feet on uneven surfaces activity did not occur: Safety/medical concerns         Wheelchair     Assist Will patient use wheelchair at discharge?: No   Wheelchair activity did not occur: Safety/medical concerns         Wheelchair 50 feet with 2 turns activity    Assist    Wheelchair 50 feet with 2 turns activity did not occur: Safety/medical concerns       Wheelchair 150 feet activity     Assist  Wheelchair 150 feet activity did not occur: Safety/medical concerns       Blood pressure 121/65, pulse (!) 101, temperature 98.4 F (36.9 C), temperature source Oral, resp. rate 20, height 5\' 10"  (1.778 m), weight (!) 155.2 kg, SpO2 93 %.  Medical Problem List and Plan: 1.  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             - continue therapies as tolerated. Trying to work through pain related to wounds.     2.  Antithrombotics: -DVT/anticoagulation: Lovenox 85 mg  daily.               -antiplatelet therapy: Aspirin 81 mg daily 3. Pain Management: Advil as needed             Oxycodone as needed             11/5: pain improved with 400mg  tid of gabapentin  11/6-9: stable  11/12: continue gabapentin, kpad   -dilaudid for more severe pain   -increased dilaudid to 4mg  30" prior to wound care 4. Mood: Provide emotional support             -antipsychotic agents: N/A 5. Neuropsych: This patient is capable of making decisions on his own behalf. 6. 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 may ultimately need  surgery to help expose the wound further. May need to re-visit surgical assessment next week 7. Fluids/Electrolytes/Nutrition: encourage po 8.  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  Patient completed 5-day course Solu-Medrol             Supportive care with range of motion             Recommend NCV/EMG as outpatient.  -increased gabapentin as above 9.  New onset diabetes mellitus, hemoglobin A1c 7.2.  NovoLog 4 units 3 times daily, Levemir 15 units twice daily.               CBG (last 3)  Recent Labs    06/21/20 1644 06/21/20 2112 06/22/20 0559  GLUCAP 112* 109* 123*   -11/12: CBGs well controlled 10.  Hypertension with tachycardia.  Lopressor 50 mg twice daily.               10/31 pt hypotensive yesterday with OT which wasn't unexpected   -TEDS, Abd binder   -push po fluids   -continue to acclimate, get OOB   -reassured him that this is to be expected   -continue to monitor  11/3: Increased Lopressor to 75mg  BID  11/12 bp  controlled today, tachy not helped by pain 11.  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  Dietary follow-up 12. Fever, UCX with 100 Klebsiella  -stool sample also positive for salmonella  -chest xray and exam unremarkable  -blood cultures pending but negative so far  -abx narrowed to augmentin which  completed 11/11  -encourage PO  -recheck cbc Monday    13. Hypokalemia: K+ 3.1 on 11/10: supplement Kdur 13/10 BID and repeat tomorrow.   -11/11 k+ 3.4   -continue supplementation      LOS: 14 days A FACE TO FACE EVALUATION WAS PERFORMED  13/11 06/22/2020, 11:22 AM

## 2020-06-22 NOTE — Progress Notes (Signed)
Patient informed that it had been determined that the salmonella had been hospital acquired by this writer.

## 2020-06-23 ENCOUNTER — Inpatient Hospital Stay (HOSPITAL_COMMUNITY): Payer: BC Managed Care – PPO | Admitting: Occupational Therapy

## 2020-06-23 LAB — MAGNESIUM: Magnesium: 2.1 mg/dL (ref 1.7–2.4)

## 2020-06-23 LAB — GLUCOSE, CAPILLARY
Glucose-Capillary: 89 mg/dL (ref 70–99)
Glucose-Capillary: 103 mg/dL — ABNORMAL HIGH (ref 70–99)
Glucose-Capillary: 100 mg/dL — ABNORMAL HIGH (ref 70–99)
Glucose-Capillary: 103 mg/dL — ABNORMAL HIGH (ref 70–99)
Glucose-Capillary: 151 mg/dL — ABNORMAL HIGH (ref 70–99)

## 2020-06-23 MED ORDER — SORBITOL 70 % SOLN
45.0000 mL | Freq: Once | Status: AC
  Administered 2020-06-23: 45 mL via ORAL
  Filled 2020-06-23: qty 60

## 2020-06-23 NOTE — Progress Notes (Signed)
[place] PHYSICAL MEDICINE & REHABILITATION PROGRESS NOTE   Subjective/Complaints:   Pt says no pain except sacral wound- from hydrotherapy- waiting for air mattress- was brought after rounds.   Still on 2L O2- Plans on having BM today- team asked for sorbitol for pt. Will give 45G since bigger pt.    ROS:  Pt denies SOB, abd pain, CP, N/V/C/D, and vision changes     No results found. No results for input(s): WBC, HGB, HCT, PLT in the last 72 hours. Recent Labs    06/21/20 0644  NA 138  K 3.4*  CL 98  CO2 30  GLUCOSE 97  BUN <5*  CREATININE 0.51*  CALCIUM 8.9    Intake/Output Summary (Last 24 hours) at 06/23/2020 1422 Last data filed at 06/23/2020 1413 Gross per 24 hour  Intake 417 ml  Output 800 ml  Net -383 ml     Pressure Injury 06/08/20 Coccyx Unstageable - Full thickness tissue loss in which the base of the injury is covered by slough (yellow, tan, gray, green or brown) and/or eschar (tan, brown or black) in the wound bed. yellow necrotic tissue in the crease o (Active)  06/08/20 1830 (with Lizabeth Leyden RN)  Location: Coccyx  Location Orientation:   Staging: Unstageable - Full thickness tissue loss in which the base of the injury is covered by slough (yellow, tan, gray, green or brown) and/or eschar (tan, brown or black) in the wound bed.  Wound Description (Comments): yellow necrotic tissue in the crease originally put in as a nonpressure wound  Present on Admission: Yes    Physical Exam: Vital Signs Blood pressure 134/83, pulse (!) 101, temperature 98.1 F (36.7 C), resp. rate 20, height 5\' 10"  (1.778 m), weight (!) 156.9 kg, SpO2 97 %.  Constitutional: No distress . Vital signs reviewed. laying in regular bed (air mattress being brought in), laying somewhat on R side, NAD HEENT: EOMI, oral membranes moist Neck: supple Cardiovascular: RRR    Respiratory/Chest: O2 2L [place]; CTA B/L- no W/R/R- decreased at bases, but good otherwise   GI/Abdomen:  protuberant, NT, distended?; hypoactive BS Ext: no clubbing, cyanosis, or edema Psych: pleasant and cooperative Skin: eschar on toes, improving.  Sacral wound with increasing granulation tissue although full depth of wound difficult to see, s/s drainage. Neuro: Ox3 . Cranial nerves 2-12 are intact. DTR's 1+.  Fine motor coordination is intact. No tremors. Motor function is grossly 5/5 RUE. LUE with ABD/EE,WE weakness ongoing  Musculoskeletal: Full ROM, No pain with AROM or PROM in the neck, trunk, or LE's.  Posture appropriate       Assessment/Plan: 1. 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  Physiatrist is providing close team supervision and 24 hour management of active medical problems listed below.  Physiatrist and rehab team continue to assess barriers to discharge/monitor patient progress toward functional and medical goals  Care Tool:  Bathing    Body parts bathed by patient: Chest, Abdomen, Left arm   Body parts bathed by helper: Right arm     Bathing assist Assist Level: 2 Helpers     Upper Body Dressing/Undressing Upper body dressing   What is the patient wearing?: Pull over shirt    Upper body assist Assist Level: Minimal Assistance - Patient > 75%    Lower Body Dressing/Undressing Lower body dressing      What is the patient wearing?: Incontinence brief,  Pants     Lower body assist Assist for lower body dressing: Maximal Assistance - Patient 25 - 49%     Toileting Toileting    Toileting assist Assist for toileting: Minimal Assistance - Patient > 75% (Assist the pt with the urinal)     Transfers Chair/bed transfer  Transfers assist  Chair/bed transfer activity did not occur: Safety/medical concerns  Chair/bed transfer assist level: 2 Helpers (requires mod of 2 w/sit to stand from wc) Chair/bed transfer assistive device:  Geologist, engineering   Ambulation assist   Ambulation activity did not occur: Safety/medical concerns  Assist level: Minimal Assistance - Patient > 75% Assistive device: Fara Boros (second person to manage wc and 02 but not for physical assist of pt) Max distance: 140   Walk 10 feet activity   Assist  Walk 10 feet activity did not occur: Safety/medical concerns  Assist level: Minimal Assistance - Patient > 75% Assistive device: Walker-Eva   Walk 50 feet activity   Assist Walk 50 feet with 2 turns activity did not occur: Safety/medical concerns (did not perform turns)  Assist level: Minimal Assistance - Patient > 75% Assistive device: Walker-Eva    Walk 150 feet activity   Assist Walk 150 feet activity did not occur: Safety/medical concerns         Walk 10 feet on uneven surface  activity   Assist Walk 10 feet on uneven surfaces activity did not occur: Safety/medical concerns         Wheelchair     Assist Will patient use wheelchair at discharge?: No   Wheelchair activity did not occur: Safety/medical concerns         Wheelchair 50 feet with 2 turns activity    Assist    Wheelchair 50 feet with 2 turns activity did not occur: Safety/medical concerns       Wheelchair 150 feet activity     Assist  Wheelchair 150 feet activity did not occur: Safety/medical concerns       Blood pressure 134/83, pulse (!) 101, temperature 98.1 F (36.7 C), resp. rate 20, height 5\' 10"  (1.778 m), weight (!) 156.9 kg, SpO2 97 %.  Medical Problem List and Plan: 1.  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             - continue therapies as tolerated. Trying to work through pain related to wounds.     2.  Antithrombotics: -DVT/anticoagulation:  Lovenox 85 mg daily.               -antiplatelet therapy: Aspirin 81 mg daily 3. Pain Management: Advil as needed             Oxycodone as needed             11/5: pain improved with 400mg  tid of gabapentin  11/6-9: stable  11/12: continue gabapentin, kpad   -dilaudid for more severe pain   -increased dilaudid to 4mg  30" prior to wound care  11/13- pt reports most of pain is sacral wound- and wound care- doing OK otherwise- con't regimen 4. Mood: Provide emotional support             -antipsychotic agents: N/A 5. Neuropsych: This patient  is capable of making decisions on his own behalf. 6. 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 may ultimately need surgery to help expose the wound further. May need to re-visit surgical assessment next week 7. Fluids/Electrolytes/Nutrition: encourage po 8.  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  Patient completed 5-day course Solu-Medrol             Supportive care with range of motion             Recommend NCV/EMG as outpatient.  -increased gabapentin as above 9.  New onset diabetes mellitus, hemoglobin A1c 7.2.  NovoLog 4 units 3 times daily, Levemir 15 units twice daily.               CBG (last 3)  Recent Labs    06/22/20 2017 06/23/20 0644 06/23/20 1145  GLUCAP 146* 89 103*   11/13- BGs 89-146- controlled -con't regimen 10.  Hypertension with tachycardia.  Lopressor 50 mg twice daily.               10/31 pt hypotensive yesterday with OT which wasn't unexpected   -TEDS, Abd binder   -push po fluids   -continue to acclimate, get OOB   -reassured him that this is to be expected   -continue to monitor  11/3: Increased Lopressor to 75mg  BID  11/12 bp  controlled today, tachy not helped by pain 11.  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  Dietary follow-up 12. Fever, UCX with 100 Klebsiella  -stool sample also positive for  salmonella  -chest xray and exam unremarkable  -blood cultures pending but negative so far  -abx narrowed to augmentin which completed 11/11  -encourage PO  -recheck cbc Monday    13. Hypokalemia: K+ 3.1 on 11/10: supplement Kdur 13/10 BID and repeat tomorrow.   -11/11 k+ 3.4   -continue supplementation   11/13- BMP recheck on monday     LOS: 15 days A FACE TO FACE EVALUATION WAS PERFORMED  Aasha Dina 06/23/2020, 2:22 PM

## 2020-06-23 NOTE — Progress Notes (Signed)
Physical Therapy Wound Treatment Patient Details  Name: DATHAN ATTIA MRN: [id number] Date of Birth: 1986/07/07  Today's Date: 06/23/2020 PT Individual Time: 6010-9323 PT Individual Time Calculation (min): 20 min   Subjective  Subjective: Do we have to do this today? Patient and Family Stated Goals: to get on his side more to remove pressure from wound Date of Onset:  (unknown ) Prior Treatments: dressing change  Pain Score:    Wound Assessment  Pressure Injury 06/08/20 Coccyx Unstageable - Full thickness tissue loss in which the base of the injury is covered by slough (yellow, tan, gray, green or brown) and/or eschar (tan, brown or black) in the wound bed. yellow necrotic tissue in the crease o (Active)  Dressing Type ABD;Moist to moist 06/23/20 1122  Dressing Changed 06/23/20 1122  Dressing Change Frequency Daily 06/23/20 1122  State of Healing Eschar 06/23/20 1122  Site / Wound Assessment Bleeding;Yellow;Brown 06/23/20 1122  % Wound base Red or Granulating 10% 06/23/20 1122  % Wound base Yellow/Fibrinous Exudate 0% 06/22/20 1300  % Wound base Black/Eschar 80% 06/23/20 1122  % Wound base Other/Granulation Tissue (Comment) 0% 06/22/20 1300  Peri-wound Assessment Maceration;Pink 06/23/20 1122  Wound Length (cm) 3 cm 06/21/20 1013  Wound Width (cm) 1 cm 06/21/20 1013  Wound Depth (cm) 5 cm 06/21/20 1013  Wound Surface Area (cm^2) 3 cm^2 06/21/20 1013  Wound Volume (cm^3) 15 cm^3 06/21/20 1013  Margins Unattached edges (unapproximated) 06/23/20 1122  Drainage Amount Moderate 06/23/20 1122  Drainage Description Serosanguineous 06/23/20 1122  Treatment Debridement (Selective);Hydrotherapy (Pulse lavage);Packing (Saline gauze) 06/22/20 1300     Santyl applied to wound bed prior to applying dressing.  Hydrotherapy Pulsed lavage therapy - wound location: sacrum Pulsed Lavage with Suction (psi): 12 psi Pulsed Lavage with Suction - Normal Saline Used: 1000 mL Pulsed Lavage Tip:  Tip with splash shield Selective Debridement Selective Debridement - Location: sacrum Selective Debridement - Tools Used: Forceps;Scissors (difficult to assess wound bed due to small opening,  ) Selective Debridement - Tissue Removed: brown necrotic tissue   Wound Assessment and Plan  Wound Therapy - Assess/Plan/Recommendations Wound Therapy - Clinical Statement: Increased bleeding from wound edges this session.  Pt continues to have small amounts of eschar removed with sharps debridement.  Debridement remains difficult due to small opening.  Continues to benefit from hydro therapy and sharps debridement to reduce bioburden and remove non-viable tissue.   Wound Therapy - Functional Problem List: decreased mobility Factors Delaying/Impairing Wound Healing: Immobility Hydrotherapy Plan: Debridement;Patient/family education;Pulsatile lavage with suction Wound Therapy - Frequency: 6X / week Wound Therapy - Follow Up Recommendations: Home health RN Wound Plan: see above  Wound Therapy Goals- Improve the function of patient's integumentary system by progressing the wound(s) through the phases of wound healing (inflammation - proliferation - remodeling) by: Decrease Necrotic Tissue to: 50 Decrease Necrotic Tissue - Progress: Progressing toward goal Increase Granulation Tissue to: 50 Increase Granulation Tissue - Progress: Progressing toward goal Goals/treatment plan/discharge plan were made with and agreed upon by patient/family: Yes Time For Goal Achievement: 7 days Wound Therapy - Potential for Goals: Good  Goals will be updated until maximal potential achieved or discharge criteria met.  Discharge criteria: when goals achieved, discharge from hospital, MD decision/surgical intervention, no progress towards goals, refusal/missing three consecutive treatments without notification or medical reason.  GP     Ijeoma Loor Eli Hose 06/23/2020, 11:30 AM  Erasmo Leventhal , PTA Acute Rehabilitation  Services Pager [phone] Office [phone]

## 2020-06-23 NOTE — Progress Notes (Signed)
Occupational Therapy Session Note  Patient Details  Name: Collin Brown MRN: [id number] Date of Birth: 06/28/1986  Today's Date: 06/23/2020 OT Individual Time: 0945-1045 and 1500-1530 OT Individual Time Calculation (min): 60 min  And 30 min   Short Term Goals: Week 2:  OT Short Term Goal 1 (Week 2): Pt will don shirt with min A OT Short Term Goal 2 (Week 2): Pt will complete stand pivot transfer to the BSC with min A OT Short Term Goal 3 (Week 2): Pt will don LB clothing with mod A and LRAD  Skilled Therapeutic Interventions/Progress Updates:    Visit 1: Pain: pt states he is in significant pain, RN aware and pt about to have hydro therapy. Initially pt was not willing to get out of bed due to pain and began working on LUE a/arom exercises.  RN stated the new air mattress bed had been delivered. Pt agreeable to getting out of bed to wc to have beds changed out.  It took him considerable time to move from supine to sit and then complete transfer to wc with min A due to pain.  Pt has to move at his own pace. Once in wc he sat at sink for oral care, UB b/d.   New bed brought in but had a faulty wire.   Old bed brought back in and pt transferred back to bed to prepare for hydrotherapy.  Pt resting in bed with all needs met, alarm on.   Visit 2: Pain: pt stated he was still very uncomfortable but feeling a little bit better.  Pt received in bed and new bed had arrived to room. Therapy focused on bed mobility, sit to stand, transfer bed >< w/c.  He needs a great deal of extra time to get prepared to feel ready to transfer.  He tends to have his own way of doing things and is not always receptive to therapists suggestions for technique.  He gets anxious about the pain in his bottom and moving, BUT he can do very well with his mobility. Overall bed mobility was min A and sit to stands close S to min A with stand pivot transfers close S.   Pt really did like the new air mattress and felt it was  much more comfortable.  RN in room with patient.   Therapy Documentation Precautions:  Precautions Precautions: Fall Precaution Comments: flaccid LUE (protect from subluxation); sling when OOB for comfort; skin integrity (sacral wound); monitor vitals Required Braces or Orthoses: Sling Restrictions Weight Bearing Restrictions: No      Pain: Pain Assessment Pain Scale: 0-10 Pain Score: 10-Worst pain ever Pain Location: Buttocks Pain Intervention(s): MD notified (Comment) ADL: ADL Upper Body Bathing: Maximal assistance Where Assessed-Upper Body Bathing: Edge of bed Lower Body Bathing: Other (comment) (+2 assist) Where Assessed-Lower Body Bathing: Edge of bed, Bed level (attempted to wash buttocks but quickly fatigued; therefore completed at bed level sidelying) Upper Body Dressing: Maximal assistance Where Assessed-Upper Body Dressing: Edge of bed Lower Body Dressing: Other (Comment) (attempted in standing but fatigued; bed level +2 total assist) Where Assessed-Lower Body Dressing: Edge of bed, Bed level Toileting: Dependent Where Assessed-Toileting: Bed level (urinal and incontinent of bowel)   Therapy/Group: Individual Therapy  , 06/23/2020, 7:58 AM  

## 2020-06-24 ENCOUNTER — Inpatient Hospital Stay (HOSPITAL_COMMUNITY): Payer: BC Managed Care – PPO | Admitting: Occupational Therapy

## 2020-06-24 ENCOUNTER — Inpatient Hospital Stay (HOSPITAL_COMMUNITY): Payer: BC Managed Care – PPO

## 2020-06-24 LAB — GLUCOSE, CAPILLARY
Glucose-Capillary: 103 mg/dL — ABNORMAL HIGH (ref 70–99)
Glucose-Capillary: 96 mg/dL (ref 70–99)
Glucose-Capillary: 104 mg/dL — ABNORMAL HIGH (ref 70–99)
Glucose-Capillary: 162 mg/dL — ABNORMAL HIGH (ref 70–99)

## 2020-06-24 NOTE — Progress Notes (Addendum)
Occupational Therapy Session Note  Patient Details  Name: Collin Brown MRN: [id number] Date of Birth: 1986/07/11  Today's Date: 06/24/2020 OT Individual Time: 0932-3557 OT Individual Time Calculation (min): 61 min Missed 14 mins due to pain   Short Term Goals: Week 2:  OT Short Term Goal 1 (Week 2): Pt will don shirt with min A OT Short Term Goal 2 (Week 2): Pt will complete stand pivot transfer to the [facility] with min A OT Short Term Goal 3 (Week 2): Pt will don LB clothing with mod A and LRAD  Skilled Therapeutic Interventions/Progress Updates:    Treatment session with focus on self-care retraining, bed mobility, and endurance.  Pt received supine in bed asleep, requiring increased time to fully arouse.  Pt reports pain in buttocks prior to session, premedicated.  Engaged in bed mobility with assistance to advance LUE to assist with rolling and min-mod assist at trunk to facilitate weight shift up to sitting to EOB.  Engaged in UB bathing and dressing seated EOB.  Pt required frequent rest breaks and repositioning due to pain in buttocks.  Pt required assistance with threading LUE in to shirt sleeve and adjusting sleeve.  Therapist applied sling to LUE for increased comfort.  Pt engaged in anterior weight shifting/rocking in preparation for sit > stand for LB dressing however reports increased pain in buttocks.  RN reports pt not due for pain meds at this time.  Pt declined any OOB activity due to excruciating pain.  Pt returned to bed Mod assist to lift BLE in to bed.  Therapist completed LB dressing while pt rolled Rt and Lt and bridging.  Pt positioned in to sidelying for comfort.  Pt missed remaining 14 mins due to pain.  Per MD, pt unable to have BM on BSC due to too much pain in buttocks.  Therapist obtained padded roll-in shower chair to attempt to alleviate pain when in upright position to toilet.  Plan to attempt during future session.  Therapy Documentation Precautions:   Precautions Precautions: Fall Precaution Comments: flaccid LUE (protect from subluxation); sling when OOB for comfort; skin integrity (sacral wound); monitor vitals Required Braces or Orthoses: Sling Restrictions Weight Bearing Restrictions: No General: General OT Amount of Missed Time: 14 Minutes Vital Signs:   Pain: Pain Assessment Pain Scale: 0-10 Pain Score: 7  Pain Type: Acute pain Pain Location: Buttocks Pain Orientation: Left Pain Descriptors / Indicators: Aching Pain Frequency: Constant Pain Onset: On-going Patients Stated Pain Goal: 2 Pain Intervention(s): Medication (See eMAR)   Therapy/Group: Individual Therapy  Rosalio Loud 06/24/2020, 9:44 AM

## 2020-06-24 NOTE — Progress Notes (Signed)
[place] PHYSICAL MEDICINE & REHABILITATION PROGRESS NOTE   Subjective/Complaints:   Pt says needs a padded BSC to have BM- will not go on "hard plastic" BSC or metal bedpan since hurts sacral ulcer too much.   Air mattress helps sacral pain some- esp when lays somewhat on side- had a bladder accident last night- woke up and HAD to go-  Feels like needs/could have BM, but refuses the options he's been given- will check into padded BSC    ROS:   Pt denies SOB, abd pain, CP, N/V/C/D, and vision changes    No results found. No results for input(s): WBC, HGB, HCT, PLT in the last 72 hours. No results for input(s): NA, K, CL, CO2, GLUCOSE, BUN, CREATININE, CALCIUM in the last 72 hours.  Intake/Output Summary (Last 24 hours) at 06/24/2020 1340 Last data filed at 06/24/2020 7829 Gross per 24 hour  Intake 480 ml  Output 830 ml  Net -350 ml     Pressure Injury 06/08/20 Coccyx Unstageable - Full thickness tissue loss in which the base of the injury is covered by slough (yellow, tan, gray, green or brown) and/or eschar (tan, brown or black) in the wound bed. yellow necrotic tissue in the crease o (Active)  06/08/20 1830 (with Lizabeth Leyden RN)  Location: Coccyx  Location Orientation:   Staging: Unstageable - Full thickness tissue loss in which the base of the injury is covered by slough (yellow, tan, gray, green or brown) and/or eschar (tan, brown or black) in the wound bed.  Wound Description (Comments): yellow necrotic tissue in the crease originally put in as a nonpressure wound  Present on Admission: Yes    Physical Exam: Vital Signs Blood pressure 138/76, pulse 98, temperature 98.1 F (36.7 C), resp. rate 19, height 5\' 10"  (1.778 m), weight (!) 156.9 kg, SpO2 99 %.  Constitutional: laying on side, watching movie on ipad, NAD- now on air mattress HEENT: EOMI, oral membranes moist Neck: supple Cardiovascular: RRR    Respiratory/Chest: O2 2L By [place]; CTA B/L- no W/R/R- good  air movement GI/Abdomen: protuberant, somewhat distended; NT, hyperactive BS- needs to go, but doesn't have way to go.  Psych: pleasant and cooperative Skin: eschar on toes, improving.  Sacral wound with increasing granulation tissue although full depth of wound difficult to see, s/s drainage. Neuro: Ox3 . Cranial nerves 2-12 are intact. DTR's 1+.  Fine motor coordination is intact. No tremors. Motor function is grossly 5/5 RUE. LUE with ABD/EE,WE weakness ongoing  Musculoskeletal: Full ROM, No pain with AROM or PROM in the neck, trunk, or LE's.  Posture appropriate       Assessment/Plan: 1. 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  Physiatrist is providing close team supervision and 24 hour management of active medical problems listed below.  Physiatrist and rehab team continue to assess barriers to discharge/monitor patient progress toward functional and medical goals  Care Tool:  Bathing    Body parts bathed by patient: Left arm, Chest, Abdomen, Right upper leg, Left upper leg   Body parts bathed by helper: Right arm     Bathing assist Assist Level: Minimal Assistance - Patient > 75%     Upper Body Dressing/Undressing Upper body dressing   What is the patient wearing?: Pull over shirt    Upper body assist Assist Level: Minimal Assistance - Patient > 75%    Lower Body Dressing/Undressing Lower body  dressing      What is the patient wearing?: Pants     Lower body assist Assist for lower body dressing: Total Assistance - Patient < 25%     Toileting Toileting    Toileting assist Assist for toileting: Minimal Assistance - Patient > 75% (Assist the pt with the urinal)     Transfers Chair/bed transfer  Transfers assist  Chair/bed transfer activity did not occur: Safety/medical concerns  Chair/bed transfer assist level: 2 Helpers (requires mod  of 2 w/sit to stand from wc) Chair/bed transfer assistive device: Geologist, engineering   Ambulation assist   Ambulation activity did not occur: Safety/medical concerns  Assist level: Minimal Assistance - Patient > 75% Assistive device: Fara Boros (second person to manage wc and 02 but not for physical assist of pt) Max distance: 140   Walk 10 feet activity   Assist  Walk 10 feet activity did not occur: Safety/medical concerns  Assist level: Minimal Assistance - Patient > 75% Assistive device: Walker-Eva   Walk 50 feet activity   Assist Walk 50 feet with 2 turns activity did not occur: Safety/medical concerns (did not perform turns)  Assist level: Minimal Assistance - Patient > 75% Assistive device: Walker-Eva    Walk 150 feet activity   Assist Walk 150 feet activity did not occur: Safety/medical concerns         Walk 10 feet on uneven surface  activity   Assist Walk 10 feet on uneven surfaces activity did not occur: Safety/medical concerns         Wheelchair     Assist Will patient use wheelchair at discharge?: No   Wheelchair activity did not occur: Safety/medical concerns         Wheelchair 50 feet with 2 turns activity    Assist    Wheelchair 50 feet with 2 turns activity did not occur: Safety/medical concerns       Wheelchair 150 feet activity     Assist  Wheelchair 150 feet activity did not occur: Safety/medical concerns       Blood pressure 138/76, pulse 98, temperature 98.1 F (36.7 C), resp. rate 19, height 5\' 10"  (1.778 m), weight (!) 156.9 kg, SpO2 99 %.  Medical Problem List and Plan: 1.  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             - continue therapies as tolerated. Trying to work through pain related to  wounds.     2.  Antithrombotics: -DVT/anticoagulation: Lovenox 85 mg daily.               -antiplatelet therapy: Aspirin 81 mg daily 3. Pain Management: Advil as needed             Oxycodone as needed             11/5: pain improved with 400mg  tid of gabapentin  11/6-9: stable  11/12: continue gabapentin, kpad   -dilaudid for more severe pain   -increased dilaudid to 4mg  30" prior to wound care  11/13- pt reports most of pain is sacral wound- and wound care- doing OK otherwise- con't regimen  11/14- says air mattress helps sacral pain some.  4. Mood: Provide emotional support             -  antipsychotic agents: N/A 5. Neuropsych: This patient is capable of making decisions on his own behalf. 6. 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 may ultimately need surgery to help expose the wound further. May need to re-visit surgical assessment next week 7. Fluids/Electrolytes/Nutrition: encourage po 8.  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  Patient completed 5-day course Solu-Medrol             Supportive care with range of motion             Recommend NCV/EMG as outpatient.  -increased gabapentin as above 9.  New onset diabetes mellitus, hemoglobin A1c 7.2.  NovoLog 4 units 3 times daily, Levemir 15 units twice daily.               CBG (last 3)  Recent Labs    06/23/20 1932 06/24/20 0600 06/24/20 1129  GLUCAP 151* 103* 96   11/14- BGs 96-151- con't regimen 10.  Hypertension with tachycardia.  Lopressor 50 mg twice daily.               10/31 pt hypotensive yesterday with OT which wasn't unexpected   -TEDS, Abd binder   -push po fluids   -continue to acclimate, get OOB   -reassured him that this is to be expected   -continue to monitor  11/3: Increased Lopressor to 75mg  BID  11/12 bp  controlled today, tachy not helped by pain 11.  Super morbid obesity.  BMI  51.88.  Dietary follow-up  11/14- BMI down to 49.63 12. Fever, UCX with 100 Klebsiella  -stool sample also positive for salmonella  -chest xray and exam unremarkable  -blood cultures pending but negative so far  -abx narrowed to augmentin which completed 11/11  -encourage PO  -recheck cbc Monday    13. Hypokalemia: K+ 3.1 on 11/10: supplement Kdur 13/10 BID and repeat tomorrow.   -11/11 k+ 3.4   -continue supplementation   11/14- BMP on Monday 14. Constipation  11/14- LBM 4 days ago- says cannot go on [facility], toilet or bedpan due to being so hard- asked for padded BSC.  Will check with nursing to find one, if possible. If not, maybe PT/OT has an option        LOS: 16 days A FACE TO FACE EVALUATION WAS PERFORMED  Emalina Dubreuil 06/24/2020, 1:40 PM

## 2020-06-24 NOTE — Progress Notes (Signed)
Physical Therapy Session Note  Patient Details  Name: Collin Brown MRN: [id number] Date of Birth: 12-19-1985  Today's Date: 06/24/2020 PT Individual Time: 1330-1415 PT Individual Time Calculation (min): 45 min   Short Term Goals: Week 2:  PT Short Term Goal 1 (Week 2): Patient will perform bed mobility with CGA without use of hospital bed functions. PT Short Term Goal 2 (Week 2): Patient will perform basic transfers with min A consitently. PT Short Term Goal 3 (Week 2): Patient will ambulate 100 ft using LRAD with mod A.  Skilled Therapeutic Interventions/Progress Updates:     Patient in bed upon PT arrival. Patient alert and agreeable to PT session. Patient reported 5-6/10 L upper extremity pain and 3-4/10 sacral during session, RN made aware. PT provided repositioning, rest breaks, and distraction as pain interventions throughout session. Reports pain med changes helping sacral pain and increased L upper extremity pain from decreased mobility over the weekend. Patient reported need for a BM at beginning of session. Per OT, plan to pad rolling shower chair for improved sitting tolerance during BM. Placed towels with Kurlex over shower seat to provided increased padding and reduced sacral pressure.  Therapeutic Activity: Bed Mobility: Patient performed supine to sit with mod A with increased effort with air bed, and sit to supine with min A for lower extremity management due to feeling light headed after standing. Performed all bed mobility with bed flat and without use of bed rail. Provided verbal cues for L upper extremity management for rolling, setting R elbow to push up to his elbow then hand to come to sitting, and paced breathing with activity for improved breath support. Patient sat EOB with supervision at PT donned L arm sling and gait belt. Transfers: Patient performed stand pivot bed<>shower chair with bucket placed from [facility] with min A and a second person SBA-CGA for safety.  Provided verbal cues for forward weight shift and reaching back to control descent. Patient placed his R arm over PT shoulder during first transfer and performed HHA on second transfer. Patient was continent of bowl during toileting. Provided cues for diaphragmatic breathing and relaxation to avoid Val slava maneuver during BM. Required total A for peri-care in standing, became "light headed and nauseous in standing and completed transfer back to bed, see above, and resumed peri-care with patient in side-lying. RN called in to change patient's sacral dressing due to soiling from BM.  Therapeutic Exercise: Patient performed the following exercises with verbal and tactile cues for proper technique. -gentile wrist, elbow, and shoulder flexion/extension PROM x5 and practiced self-ROM in R side-lying x5  Patient in R side-lying in the bed with RN redressing sacral wound at end of session.   Therapy Documentation Precautions:  Precautions Precautions: Fall Precaution Comments: flaccid LUE (protect from subluxation); sling when OOB for comfort; skin integrity (sacral wound); monitor vitals Required Braces or Orthoses: Sling Restrictions Weight Bearing Restrictions: No   Therapy/Group: Individual Therapy  Nereida Schepp L Thoams Siefert PT, DPT  06/24/2020, 4:11 PM

## 2020-06-25 ENCOUNTER — Inpatient Hospital Stay (HOSPITAL_COMMUNITY): Payer: BC Managed Care – PPO

## 2020-06-25 ENCOUNTER — Ambulatory Visit (HOSPITAL_COMMUNITY): Payer: BC Managed Care – PPO

## 2020-06-25 ENCOUNTER — Encounter (HOSPITAL_COMMUNITY): Payer: BC Managed Care – PPO | Admitting: Occupational Therapy

## 2020-06-25 LAB — BASIC METABOLIC PANEL
Anion gap: 9 (ref 5–15)
BUN: 8 mg/dL (ref 6–20)
CO2: 28 mmol/L (ref 22–32)
Calcium: 9 mg/dL (ref 8.9–10.3)
Chloride: 101 mmol/L (ref 98–111)
Creatinine, Ser: 0.42 mg/dL — ABNORMAL LOW (ref 0.61–1.24)
GFR, Estimated: >60 mL/min (ref >60)
Glucose, Bld: 101 mg/dL — ABNORMAL HIGH (ref 70–99)
Potassium: 4.1 mmol/L (ref 3.5–5.1)
Sodium: 138 mmol/L (ref 135–145)

## 2020-06-25 LAB — CBC
HCT: 35 % — ABNORMAL LOW (ref 39.0–52.0)
Hemoglobin: 10.9 g/dL — ABNORMAL LOW (ref 13.0–17.0)
MCH: 28 pg (ref 26.0–34.0)
MCHC: 31.1 g/dL (ref 30.0–36.0)
MCV: 90 fL (ref 80.0–100.0)
Platelets: 344 10*3/uL (ref 150–400)
RBC: 3.89 MIL/uL — ABNORMAL LOW (ref 4.22–5.81)
RDW: 16.9 % — ABNORMAL HIGH (ref 11.5–15.5)
WBC: 8.6 10*3/uL (ref 4.0–10.5)
nRBC: 0 % (ref 0.0–0.2)

## 2020-06-25 LAB — GLUCOSE, CAPILLARY
Glucose-Capillary: 98 mg/dL (ref 70–99)
Glucose-Capillary: 83 mg/dL (ref 70–99)
Glucose-Capillary: 93 mg/dL (ref 70–99)
Glucose-Capillary: 107 mg/dL — ABNORMAL HIGH (ref 70–99)

## 2020-06-25 MED ORDER — OXYCODONE HCL 5 MG PO TABS
5.0000 mg | ORAL_TABLET | ORAL | Status: DC | PRN
  Administered 2020-06-25 – 2020-06-27 (×8): 10 mg via ORAL
  Filled 2020-06-25 (×9): qty 2

## 2020-06-25 NOTE — Progress Notes (Signed)
Physical Therapy Session Note  Patient Details  Name: Collin Brown MRN: [id number] Date of Birth: [birth year]  Today's Date: 06/25/2020 PT Individual Time: 0800-0900 and 1415-1500 PT Individual Time Calculation (min): 60 min and 45 min   Short Term Goals: Week 2:  PT Short Term Goal 1 (Week 2): Patient will perform bed mobility with CGA without use of hospital bed functions. PT Short Term Goal 2 (Week 2): Patient will perform basic transfers with min A consitently. PT Short Term Goal 3 (Week 2): Patient will ambulate 100 ft using LRAD with mod A.  Skilled Therapeutic Interventions/Progress Updates:     Session 1: Patient in bed upon PT arrival. Patient reported 10+/10 sacral pain during session, RN made aware and provided pain medicine during session. PT provided repositioning, rest breaks, and distraction as pain interventions throughout session. Patient declined OOB mobility due to pain this morning. Focused session on education on AD, positioning, and L upper extremity ROM and manual therapy.  Put together a L PFRW for the patient. Educated on proper use and demonstrated walking and transfer techniques using this device with patient lying in the bed.  Therapeutic Activity: Bed Mobility: Patient performed rolling R x3 with min A. Performed scooting in the bed with max A for improved positioning on his R side for pain management x3.   Therapeutic Exercise: Patient performed the following exercises in R side-lying with verbal and tactile cues for proper technique. -10 reps with gentle manual overpressure for soft tissue stretch x3-5 sec at end range for PROM wrist flexion/extension with supination/pronation, elbow flexion/extension with supination/pronation, shoulder abduction to 90 deg with elbow bent, shoulder flexion to 90 deg/extension <10 deg with elbow straight, shoulder IR/ER in scapular plane supported by towel roll, very limited ER ROM  Manual Therapy:  -soft tissue  mobilization to L upper traps, rhomboids, deltoids, biceps, triceps, and forearm musculature for desensitization and reduced muscle tightness for pain managment >10 min -L scapular mobilization elevation/depression, abduction/adduction, and anterior/posterior tilt x10 -L shoulder rolls AROM with manual facilitation for scapular mobility with motion forwards/backwards x10  Patient in bed in R side-lying at end of session with breaks locked, bed alarm set, and all needs within reach.   Session 2: Patient in right side-lying in the bed with his family in to room upon PT arrival. Patient reported 6/10 L arm pain, 7/10 sacral pain, and 8/10 low back pain during session, RN made aware. PT provided repositioning, rest breaks, and distraction as pain interventions throughout session. Patient's family present for family education session.   Patient reported increased nausea and feeling "woozy" following a BM with OT, resulting in patient eagerly returning to the bed with poor technique, please see OT note for more details. Denied any new pain except some increased L arm soreness in lying. Once in sitting reported 8/10 low back pain that he had never had before. Pain limited further mobility during session. [name], PA made aware at end of session.   Therapeutic Activity: Bed Mobility: Donned B TED hose prior to mobility with total A for time/energy conservation. Patient performed supine to sit with mod A due to decreased leverage with air mattress. Provided verbal cues for setting R elbow and facilitated coming up to his elbow then pushing to come to sitting. He performed sit to R side-lying with min A for LE management. Patient sat EOB 2-3 min with increasing low back pain and nausea and requested to return to lying. Patient reported he was unable to progress  with mobility due to symptoms.  Vitals: Lying: BP 112/70, HR 108, SPO2 96% on RA Sitting: BP 126, HR 118, SPO2 98% on RA Patient on RA throughout session,  SPO2 >95% throughout  Educated patient's family on patient's progress with therapy, PT goals, expected equipment needs, and limitations to progress during patient's stay. Will discuss with rehab team tomorrow about extending patient's stay and setting up further family education when patient is able to participate more.   Patient in bed with his family in the room at end of session with breaks locked, bed alarm set, and all needs within reach. Patient missed 30 min of skilled PT due to pain/nausea, RN made aware. Will attempt to make-up missed time as able.     Therapy Documentation Precautions:  Precautions Precautions: Fall Precaution Comments: flaccid LUE (protect from subluxation); sling when OOB for comfort; skin integrity (sacral wound); monitor vitals Required Braces or Orthoses: Sling Restrictions Weight Bearing Restrictions: No General: PT Amount of Missed Time (min): 30 Minutes PT Missed Treatment Reason: Pain;Patient ill (Comment) (nausea)   Therapy/Group: Individual Therapy  Amahia Madonia L Jamie-Lee Galdamez PT, DPT  06/25/2020, 3:35 PM

## 2020-06-25 NOTE — Progress Notes (Signed)
Patient ID: Collin Brown, male   DOB: 1986/04/25, 34 y.o.   MRN: [id number]  Referral declined by [facility] as the branch is short staffed.   SW sent HHPT/OT/SN referral to [facility]. SW waiting on follow-up.   Cecile Sheerer, MSW, LCSWA Office: [phone] Cell: [phone] Fax: [phone]

## 2020-06-25 NOTE — Progress Notes (Signed)
Physical Therapy Weekly Progress Note  Patient Details  Name: Collin Brown MRN: [id number] Date of Birth: 1986-01-22  Beginning of progress report period: June 18, 2020 End of progress report period: June 26, 2020  Today's Date: 06/26/2020  PT Individual Time: 1406-1500 PT Individual Time Calculation (min): 54 min   Patient has met 0 of 3 short term goals.  Patient was progressing well with therapy middle of this week, set back by infection earlier in the week, and now very limited by sacral wound pain, tolerated minimal activity in the room x3 days. He currently requires min-mod A for bed mobility in a standard bed, mod A-CGA for transfers with and without an AD, CGA-min A gait up to >170 feet. He has not been able to initiate stair training for home entry and is currently unable to tolerate sitting OOB due to sacral wound pain.  Patient continues to demonstrate the following deficits muscle weakness, decreased cardiorespiratoy endurance and decreased oxygen support, abnormal tone and decreased sitting balance, decreased standing balance, decreased postural control and decreased balance strategies and therefore will continue to benefit from skilled PT intervention to increase functional independence with mobility.  Patient progressing toward long term goals..  Plan of care revisions: extended LOS +1 week to progress toward goals due to limitations in functional progress this week, see above.  PT Short Term Goals Week 2:  PT Short Term Goal 1 (Week 2): Patient will perform bed mobility with CGA without use of hospital bed functions. PT Short Term Goal 1 - Progress (Week 2): Progressing toward goal PT Short Term Goal 2 (Week 2): Patient will perform basic transfers with min A consitently. PT Short Term Goal 2 - Progress (Week 2): Progressing toward goal PT Short Term Goal 3 (Week 2): Patient will ambulate 100 ft using LRAD with mod A. PT Short Term Goal 3 - Progress (Week 2):  Progressing toward goal Week 3:  PT Short Term Goal 1 (Week 3): Patient will perform bed mobility with CGA without use of hospital bed functions. PT Short Term Goal 2 (Week 3): Patient will perform basic transfers with min A consistently. PT Short Term Goal 3 (Week 3): Patient will ambulate 100 ft using LRAD with min A. PT Short Term Goal 4 (Week 3): Patient will initiate stair training.     Skilled Therapeutic Interventions: Patient in bed upon PT arrival. Patient alert and agreeable to PT session. Patient reported 6-8/10 sacral and left upper extremity pain during session, RN made aware. PT provided repositioning, rest breaks, and distraction as pain interventions throughout session. [name] and sister arrived for family education session at beginning of session.   Therapeutic Activity: Bed Mobility: Patient performed supine to/from sit with mod-min A without use of bed rail. Increased assist and effort due to air mattress and sacral pain with mobility. Provided verbal cues for setting his bottom elbow to push up and paced breathing for breath support and pain management with mobility. Transfers: Patient performed sit to/from stand x1 with min-mod A using L PFRW. Provided verbal cues for scooting EOB and forward weight shift to stand.  Gait Training:  Patient ambulated 20 feet using L PFRW with CGA-min A. Ambulated with decreased gait speed, decreased step length and height, increased B hip and knee flexion in stance, forward trunk lean, and downward head gaze. Provided verbal cues for paced breathing, erect posture, and controlled mobility with fatigue for safety.  Patient with limited tolerance for mobility this session due to sacral  pain. Spent remainder of the session discussion home set-up, patient's goals, ELOS, DME for home, and home safety with patient and his family. Will continue family education closer to d/c next week. Patient and his family were receptive and appreciative for  all education.   Patient in R side-lying positioned with pillows behind his back, between his legs, and under his L arm at end of session with breaks locked, bed alarm set, and all needs within reach.   Therapy Documentation Precautions:  Precautions Precautions: Fall Precaution Comments: flaccid LUE (protect from subluxation); sling when OOB for comfort; skin integrity (sacral wound); monitor vitals Required Braces or Orthoses: Sling Restrictions Weight Bearing Restrictions: No  Therapy/Group: Individual Therapy   L  PT, DPT  06/25/2020, 6:19 AM

## 2020-06-25 NOTE — Consult Note (Signed)
WOC Nurse wound follow up Patient receiving care in [facility] 4W16.  Chart reviewed, including photo of sacral wound from today. Wound type: Stage 4 PI Measurement: see PT notes for wound details. Wound bed: Drainage (amount, consistency, odor)  Periwound: Dressing procedure/placement/frequency: Continue hydrotherapy and enzymatic debridement. Helmut Muster, RN, MSN, CWOCN, CNS-BC, pager [phone]

## 2020-06-25 NOTE — Progress Notes (Signed)
[place] PHYSICAL MEDICINE & REHABILITATION PROGRESS NOTE   Subjective/Complaints: Discussed with Cherie- pain has been worse since hydrotherapy. Discussed with patient and he would still like to continue with hydrotherapy as he feels benefits outweigh costs. He is receiving Dilaudid around hydrotherapy to help with the pain   ROS:   Pt denies SOB, abd pain, CP, N/V/C/D, and vision changes    No results found. Recent Labs    06/25/20 0738  WBC 8.6  HGB 10.9*  HCT 35.0*  PLT 344   Recent Labs    06/25/20 0738  NA 138  K 4.1  CL 101  CO2 28  GLUCOSE 101*  BUN 8  CREATININE 0.42*  CALCIUM 9.0    Intake/Output Summary (Last 24 hours) at 06/25/2020 1042 Last data filed at 06/25/2020 9150 Gross per 24 hour  Intake 460 ml  Output 950 ml  Net -490 ml     Pressure Injury 06/08/20 Coccyx Unstageable - Full thickness tissue loss in which the base of the injury is covered by slough (yellow, tan, gray, green or brown) and/or eschar (tan, brown or black) in the wound bed. yellow necrotic tissue in the crease o (Active)  06/08/20 1830 (with Lizabeth Leyden RN)  Location: Coccyx  Location Orientation:   Staging: Unstageable - Full thickness tissue loss in which the base of the injury is covered by slough (yellow, tan, gray, green or brown) and/or eschar (tan, brown or black) in the wound bed.  Wound Description (Comments): yellow necrotic tissue in the crease originally put in as a nonpressure wound  Present on Admission: Yes    Physical Exam: Vital Signs Blood pressure 108/75, pulse 89, temperature 98.6 F (37 C), resp. rate 20, height 5\' 10"  (1.778 m), weight (!) 156.9 kg, SpO2 97 %.   General: Alert and oriented x 3, No apparent distress HEENT: Head is normocephalic, atraumatic, PERRLA, EOMI, sclera anicteric, oral mucosa pink and moist, dentition intact, ext ear canals clear,  Neck: Supple without JVD or lymphadenopathy Heart: Reg rate and rhythm. No murmurs rubs  or gallops Chest: CTA bilaterally without wheezes, rales, or rhonchi; no distress Abdomen: Soft, non-tender, non-distended, bowel sounds positive. Extremities: No clubbing, cyanosis, or edema. Pulses are 2+ Skin: eschar on toes, improving.  Sacral wound with increasing granulation tissue although full depth of wound difficult to see, s/s drainage. Neuro: Ox3 . Cranial nerves 2-12 are intact. DTR's 1+.  Fine motor coordination is intact. No tremors. Motor function is grossly 5/5 RUE. LUE with ABD/EE,WE weakness ongoing  Musculoskeletal: Full ROM, No pain with AROM or PROM in the neck, trunk, or LE's.  Posture appropriate       Assessment/Plan: 1. 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  Physiatrist is providing close team supervision and 24 hour management of active medical problems listed below.  Physiatrist and rehab team continue to assess barriers to discharge/monitor patient progress toward functional and medical goals  Care Tool:  Bathing    Body parts bathed by patient: Left arm, Chest, Abdomen, Right upper leg, Left upper leg   Body parts bathed by helper: Right arm     Bathing assist Assist Level: Minimal Assistance - Patient > 75%     Upper Body Dressing/Undressing Upper body dressing   What is the patient wearing?: Pull over shirt    Upper body assist Assist Level: Minimal Assistance - Patient > 75%  Lower Body Dressing/Undressing Lower body dressing      What is the patient wearing?: Pants     Lower body assist Assist for lower body dressing: Total Assistance - Patient < 25%     Toileting Toileting    Toileting assist Assist for toileting: Dependent - Patient 0%     Transfers Chair/bed transfer  Transfers assist  Chair/bed transfer activity did not occur: Safety/medical concerns  Chair/bed transfer assist level: 2 Helpers  (requires mod of 2 w/sit to stand from wc) Chair/bed transfer assistive device: Geologist, engineering   Ambulation assist   Ambulation activity did not occur: Safety/medical concerns  Assist level: Minimal Assistance - Patient > 75% Assistive device: Fara Boros (second person to manage wc and 02 but not for physical assist of pt) Max distance: 140   Walk 10 feet activity   Assist  Walk 10 feet activity did not occur: Safety/medical concerns  Assist level: Minimal Assistance - Patient > 75% Assistive device: Walker-Eva   Walk 50 feet activity   Assist Walk 50 feet with 2 turns activity did not occur: Safety/medical concerns (did not perform turns)  Assist level: Minimal Assistance - Patient > 75% Assistive device: Walker-Eva    Walk 150 feet activity   Assist Walk 150 feet activity did not occur: Safety/medical concerns         Walk 10 feet on uneven surface  activity   Assist Walk 10 feet on uneven surfaces activity did not occur: Safety/medical concerns         Wheelchair     Assist Will patient use wheelchair at discharge?: No   Wheelchair activity did not occur: Safety/medical concerns         Wheelchair 50 feet with 2 turns activity    Assist    Wheelchair 50 feet with 2 turns activity did not occur: Safety/medical concerns       Wheelchair 150 feet activity     Assist  Wheelchair 150 feet activity did not occur: Safety/medical concerns       Blood pressure 108/75, pulse 89, temperature 98.6 F (37 C), resp. rate 20, height 5\' 10"  (1.778 m), weight (!) 156.9 kg, SpO2 97 %.  Medical Problem List and Plan: 1.  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             - continue therapies as tolerated. Trying to work through pain  related to wounds.  2.  Antithrombotics: -DVT/anticoagulation: Lovenox 85 mg daily.               -antiplatelet therapy: Aspirin 81 mg daily 3. Pain Management: Advil as needed             Oxycodone as needed             11/5: pain improved with 400mg  tid of gabapentin  11/6-9: stable  11/12: continue gabapentin, kpad   -dilaudid for more severe pain   -increased dilaudid to 4mg  30" prior to wound care  11/13- pt reports most of pain is sacral wound- and wound care- doing OK otherwise- con't regimen  11/14- says air mattress helps sacral pain some.   11/15: pain continues to be severe around hydrotherapy but patient  prefers to continue with it given its benefits for wound healing.  4. Mood: Provide emotional support             -antipsychotic agents: N/A 5. Neuropsych: This patient is capable of making decisions on his own behalf. 6. 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 may ultimately need surgery to help expose the wound further. May need to re-visit surgical assessment next week. Surgery has been following and wound is improving.  7. Fluids/Electrolytes/Nutrition: encourage po 8.  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  Patient completed 5-day course Solu-Medrol             Supportive care with range of motion             Recommend NCV/EMG as outpatient.  -increased gabapentin as above 9.  New onset diabetes mellitus, hemoglobin A1c 7.2.  NovoLog 4 units 3 times daily, Levemir 15 units twice daily.               CBG (last 3)  Recent Labs    06/24/20 1655 06/24/20 1925 06/25/20 0620  GLUCAP 104* 162* 98   11/15: CBGs 98-162: continue to monitor.  10.  Hypertension with tachycardia.  Lopressor 50 mg twice daily.               10/31 pt hypotensive yesterday with OT which wasn't unexpected   -TEDS, Abd binder   -push po fluids   -continue to acclimate,  get OOB   -reassured him that this is to be expected   -continue to monitor  11/3: Increased Lopressor to 75mg  BID  11/12 bp  controlled today, tachy not helped by pain 11.  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  Dietary follow-up  11/14- BMI down to 49.63 12. Fever, UCX with 100 Klebsiella  -stool sample also positive for salmonella  -chest xray and exam unremarkable  -blood cultures pending but negative so far  -abx narrowed to augmentin which completed 11/11  -encourage PO  -recheck cbc Monday    13. Hypokalemia: K+ 3.1 on 11/10: supplement Kdur 13/10 BID and repeat tomorrow.   -11/11 k+ 3.4   -continue supplementation   11/14- BMP on Monday 14. Constipation  11/14- LBM 4 days ago- says cannot go on [facility], toilet or bedpan due to being so hard- asked for padded BSC.  Will check with nursing to find one, if possible. If not, maybe PT/OT has an option        LOS: 17 days A FACE TO FACE EVALUATION WAS PERFORMED  Der Gagliano P Morgann Woodburn 06/25/2020, 10:42 AM

## 2020-06-25 NOTE — Plan of Care (Signed)
  Problem: Consults Goal: RH GENERAL PATIENT EDUCATION Description: See Patient Education module for education specifics. Outcome: Progressing Goal: Skin Care Protocol Initiated - if Braden Score 18 or less Description: If consults are not indicated, leave blank or document N/A Outcome: Progressing Goal: Nutrition Consult-if indicated Outcome: Progressing Goal: Diabetes Guidelines if Diabetic/Glucose > 140 Description: If diabetic or lab glucose is > 140 mg/dl - Initiate Diabetes/Hyperglycemia Guidelines & Document Interventions  Outcome: Progressing   Problem: RH BOWEL ELIMINATION Goal: RH STG MANAGE BOWEL WITH ASSISTANCE Description: STG Manage Bowel with  mod Assistance. Outcome: Progressing Goal: RH STG MANAGE BOWEL W/MEDICATION W/ASSISTANCE Description: STG Manage Bowel with Medication with supervision Assistance. Outcome: Progressing   Problem: RH SKIN INTEGRITY Goal: RH STG SKIN FREE OF INFECTION/BREAKDOWN Description: Skin to remain free from additional breakdown while on rehab with supervision assistance. Outcome: Progressing Goal: RH STG MAINTAIN SKIN INTEGRITY WITH ASSISTANCE Description: STG Maintain Skin Integrity With  mod I Assistance. Outcome: Progressing Goal: RH STG ABLE TO PERFORM INCISION/WOUND CARE W/ASSISTANCE Description: STG Able To Perform Incision/Wound Care With mod I Assistance. Outcome: Progressing   Problem: RH SAFETY Goal: RH STG ADHERE TO SAFETY PRECAUTIONS W/ASSISTANCE/DEVICE Description: STG Adhere to Safety Precautions With  mod I Assistance/Device. Outcome: Progressing   Problem: RH PAIN MANAGEMENT Goal: RH STG PAIN MANAGED AT OR BELOW PT'S PAIN GOAL Description: Pain free and <3  Outcome: Progressing

## 2020-06-25 NOTE — Progress Notes (Signed)
Small amount of frank blood in stool this afternoon. [name], PA notified.

## 2020-06-25 NOTE — Progress Notes (Signed)
Physical Therapy Wound Treatment Patient Details  Name: JANIEL CRISOSTOMO MRN: [id number] Date of Birth: 1986-03-14  Today's Date: 06/25/2020 PT Individual Time: 1007-1100 PT Individual Time Calculation (min): 53 min   Subjective  Subjective: "It's about time. You were supposed to be here at 10:00." --at 10:07.  Patient and Family Stated Goals: None stated at this time.  Date of Onset:  (unknown) Prior Treatments: dressing changes  Pain Score: Pt was premedicated and denied pain prior to session starting.   Wound Assessment  Pressure Injury 06/08/20 Coccyx Unstageable - Full thickness tissue loss in which the base of the injury is covered by slough (yellow, tan, gray, green or brown) and/or eschar (tan, brown or black) in the wound bed. yellow necrotic tissue in the crease o (Active)  Wound Image   06/25/20 1109  Dressing Type ABD;Barrier Film (skin prep);Gauze (Comment);Moist to dry 06/25/20 1109  Dressing Changed;Clean;Dry;Intact 06/25/20 1109  Dressing Change Frequency Daily 06/25/20 1109  State of Healing Non-healing 06/25/20 1109  Site / Wound Assessment Pink;Yellow;Black 06/25/20 1109  % Wound base Red or Granulating 5% 06/25/20 1109  % Wound base Yellow/Fibrinous Exudate 90% 06/25/20 1109  % Wound base Black/Eschar 5% 06/25/20 1109  % Wound base Other/Granulation Tissue (Comment) 0% 06/25/20 1109  Peri-wound Assessment Intact;Pink 06/25/20 1109  Wound Length (cm) 3.2 cm 06/25/20 1026  Wound Width (cm) 1.5 cm 06/25/20 1026  Wound Depth (cm) 5 cm 06/25/20 1026  Wound Surface Area (cm^2) 4.8 cm^2 06/25/20 1026  Wound Volume (cm^3) 24 cm^3 06/25/20 1026  Undermining (cm) 4 cm at 12:00; 4.5 cm at 3:00 06/25/20 1026  Margins Unattached edges (unapproximated) 06/25/20 1109  Drainage Amount Copious 06/25/20 1109  Drainage Description Purulent;Odor 06/25/20 1109  Treatment Debridement (Selective);Hydrotherapy (Pulse lavage);Packing (Saline gauze) 06/25/20 1109   Santyl applied to  wound bed prior to applying dressing.    Hydrotherapy Pulsed lavage therapy - wound location: sacrum Pulsed Lavage with Suction (psi): 12 psi Pulsed Lavage with Suction - Normal Saline Used: 1000 mL Pulsed Lavage Tip: Tip with splash shield Selective Debridement Selective Debridement - Location: sacrum Selective Debridement - Tools Used: Forceps;Scissors Selective Debridement - Tissue Removed: yellow and black necrotic tissue   Wound Assessment and Plan  Wound Therapy - Assess/Plan/Recommendations Wound Therapy - Clinical Statement: Noted very foul odor and copious drainage which appeared to be coming mostly from undermining/tunnel at 3:00. Some necrotic tissue removed but debridement was difficult due to small opening. This patient will benefit from continued hydrotherapy for removal of unviable tissue, to decrease bioburden and promote wound bed healing.  Wound Therapy - Functional Problem List: decreased mobility Factors Delaying/Impairing Wound Healing: Immobility Hydrotherapy Plan: Debridement;Patient/family education;Pulsatile lavage with suction;Dressing change Wound Therapy - Frequency: 6X / week Wound Therapy - Follow Up Recommendations: Home health RN Wound Plan: see above  Wound Therapy Goals- Improve the function of patient's integumentary system by progressing the wound(s) through the phases of wound healing (inflammation - proliferation - remodeling) by: Decrease Necrotic Tissue to: 20 Decrease Necrotic Tissue - Progress: Goal set today Increase Granulation Tissue to: 80 Increase Granulation Tissue - Progress: Goal set today Goals/treatment plan/discharge plan were made with and agreed upon by patient/family: Yes Time For Goal Achievement: 7 days Wound Therapy - Potential for Goals: Good  Goals will be updated until maximal potential achieved or discharge criteria met.  Discharge criteria: when goals achieved, discharge from hospital, MD decision/surgical intervention,  no progress towards goals, refusal/missing three consecutive treatments without notification or medical reason.  GP     [name] 06/25/2020, 11:24 AM  [name], PT, DPT Acute Rehabilitation Services Pager: [phone] Office: [phone]

## 2020-06-25 NOTE — Progress Notes (Signed)
Occupational Therapy Weekly Progress Note  Patient Details  Name: Collin Brown MRN: [id number] Date of Birth: 1985/11/12  Beginning of progress report period: June 18, 2020 End of progress report period: June 25, 2020  Today's Date: 06/25/2020 OT Individual Time: 0093-8182 OT Individual Time Calculation (min): 55 min    Patient has met 2 of 3 short term goals.  Patient very limited by pain in buttocks and increased nausea this progress period requiring increased time/assist with BADLs and functional transfers. Pain has also limited patients ability for EOB/OOB activity. Level of function fluctuates with increase in pain but patient currently demonstrates Min A for UB dressing seated EOB with assist needed to thread LUE. Patient also continues to require Max A to Total A for LB dressing at bed level 2/2 pain or seated on padded roll-in shower chair. Patient occasionally able to stand at EOB to to manage clothing with assist to advance over bottom 2/2 body habitus.   Patient continues to demonstrate the following deficits: muscle weakness, decreased cardiorespiratoy endurance and decreased oxygen support and decreased sitting balance, decreased standing balance, decreased postural control and L upper extremity weakness and therefore will continue to benefit from skilled OT intervention to enhance overall performance with BADL, iADL and Reduce care partner burden.  Patient progressing toward long term goals..  Continue plan of care.  OT Short Term Goals Week 2:  OT Short Term Goal 1 (Week 2): Pt will don shirt with min A OT Short Term Goal 1 - Progress (Week 2): Progressing toward goal OT Short Term Goal 2 (Week 2): Pt will complete stand pivot transfer to the [facility] with min A OT Short Term Goal 2 - Progress (Week 2): Progressing toward goal OT Short Term Goal 3 (Week 2): Pt will don LB clothing with mod A and LRAD OT Short Term Goal 3 - Progress (Week 2): Progressing toward goal Week  3:  OT Short Term Goal 1 (Week 3): STG=LTG 2/2 ELOS  Skilled Therapeutic Interventions/Progress Updates:  Patient met lying supine in bed in agreement with OT treatment session. No family present at start of OT treatment session for scheduled family ed. Patient c/o 3-4/10 pain in buttocks at rest increasing to 5-6/10 with activity. Supine to EOB with assist to manage LUE and bring trunk upright with HOB slightly elevated. Assist to don LUE sling for comfort. Seated EOB, patient required assist for anterior scoot toward EOB and sit to stand with Min A for stand-pivot transfer to padded roll-in shower chair used as BSC. Patient continent of bowel and bladder with bright red blood present in stool. RN aware. Seated on [facility] patient began c/o nausea. Blue bag offered but patient declined. OT education on techniques to decrease nausea but patient declined with perseveration on return to bed. Sit to stand from padded roll-in shower chair with Min A to Mod A. Patient declined completion of hygiene/clothing management requiring external assistance. During stand-pivot transfer back to EOB, patient spontaneously shifted weight to LLE, raised R knee onto bed surface and fell onto bed face first despite OT's verbal cues to sit safely at EOB. When questioned about patients decreased safety awareness, patient stated frustration with therapist about having him complete these tasks while feeling unwell. OT asked patient about personal goals and expectations upon d/c. Patient states that he's already met all of his personal goals which included walking and being able to do "a few things" for himself. When OT educated patient on current level of function with ADLs,  patient stated that he's only needing that much assist right now but won't need that much assist when he gets home. Patient demonstrates decreased awareness of deficits and need for external assist. Session concluded with patient in bed with call bell within reach, family  present at bedside, and all needs met.    Therapy Documentation Precautions:  Precautions Precautions: Fall Precaution Comments: flaccid LUE (protect from subluxation); sling when OOB for comfort; skin integrity (sacral wound); monitor vitals Required Braces or Orthoses: Sling Restrictions Weight Bearing Restrictions: No General:    Therapy/Group: Individual Therapy  Firman Petrow R Howerton-Davis 06/25/2020, 7:35 AM

## 2020-06-26 ENCOUNTER — Ambulatory Visit (HOSPITAL_COMMUNITY): Payer: BC Managed Care – PPO

## 2020-06-26 ENCOUNTER — Inpatient Hospital Stay (HOSPITAL_COMMUNITY): Payer: BC Managed Care – PPO | Admitting: Physical Therapy

## 2020-06-26 ENCOUNTER — Encounter (HOSPITAL_COMMUNITY): Payer: BC Managed Care – PPO

## 2020-06-26 LAB — GLUCOSE, CAPILLARY
Glucose-Capillary: 96 mg/dL (ref 70–99)
Glucose-Capillary: 99 mg/dL (ref 70–99)
Glucose-Capillary: 164 mg/dL — ABNORMAL HIGH (ref 70–99)
Glucose-Capillary: 122 mg/dL — ABNORMAL HIGH (ref 70–99)
Glucose-Capillary: 114 mg/dL — ABNORMAL HIGH (ref 70–99)

## 2020-06-26 NOTE — Progress Notes (Signed)
Patient ID: Collin Brown, male   DOB: 01/27/1986, 34 y.o.   MRN: [id number]  SW received updates from [facility] reporting referral was declined due to staffing and wound.   SW sent HHPT/OT/SN referral to [facility]. SW waiting on follow-up.   Cecile Sheerer, MSW, LCSWA Office: [phone] Cell: [phone] Fax: [phone]

## 2020-06-26 NOTE — Progress Notes (Signed)
Occupational Therapy Session Note  Patient Details  Name: Collin Brown MRN: [id number] Date of Birth: 09/08/1985  Today's Date: 06/26/2020 OT Individual Time: 1305-1359 OT Individual Time Calculation (min): 54 min    Short Term Goals: Week 3:  OT Short Term Goal 1 (Week 3): STG=LTG 2/2 ELOS  Skilled Therapeutic Interventions/Progress Updates:    Pt received supine with no c/o pain at rest. Pt on RA and O2 sats at 89%. Encouraged pt to don 1.5 L O2 via  and he rebounded to 95% once EOB. Pt completed rolling to his R with CGA. Max A required to transition from sidelying to sitting EOB. Pt c/o high levels of pain in his sacrum and L arm during transfer. His pain subsided EOB but returned after several minutes. Pt required several extended rest breaks throughout session 2/2 pain and fatigue. SpO2 remained stable throughout rest of session on 1.5 L. Pt attempted x4 to complete sit > stand from EOB but was unable with max A. Pt required assist back to supine 2/2 sacrum pain increasing. Max A to position and hold urinal for 350 cc void. Pt completed slight bridge for removal of pants and brief. Pt rolled to don new brief, dependent for clothing management. Max A to don new pants. Pt was assisted in positioning himself in sidelying to alleviate pressure from his sacrum. He was left with all needs met.   Therapy Documentation Precautions:  Precautions Precautions: Fall Precaution Comments: flaccid LUE (protect from subluxation); sling when OOB for comfort; skin integrity (sacral wound); monitor vitals Required Braces or Orthoses: Sling Restrictions Weight Bearing Restrictions: No   Therapy/Group: Individual Therapy   H  06/26/2020, 6:28 AM  

## 2020-06-26 NOTE — Progress Notes (Signed)
Physical Therapy Session Note  Patient Details  Name: Collin Brown MRN: [id number] Date of Birth: 1985-12-28  Today's Date: 06/26/2020 PT Individual Time: 8527-7824 PT Individual Time Calculation (min): 68 min   Short Term Goals: Week 2:  PT Short Term Goal 1 (Week 2): Patient will perform bed mobility with CGA without use of hospital bed functions. PT Short Term Goal 2 (Week 2): Patient will perform basic transfers with min A consitently. PT Short Term Goal 3 (Week 2): Patient will ambulate 100 ft using LRAD with mod A.  Skilled Therapeutic Interventions/Progress Updates:    Pt received R semi-sidelying in bed and agreeable to therapy session. Received and maintained on RA during session with SpO2 >90%. Pt reports recent pain medication administration. Donned B LE TED hose and threaded LEs in pants max assist. Supine>sitting R EOB, HOB slightly elevated but not using bedrail in preparation for home environment, with mod assist for trunk upright and pt pushing up with R arm. Sitting EOB donned L UE sling and tennis shoes max assist. Sit>stand elevated EOB>R UE support on bedrail with mod assist for lifting up into standing, required 2 attempts prior to achieving stand. Standing with CGA for safety pulled pants up over hips max assist. L stand pivot EOB>TIS w/c with min assist for balance - pt immediately has significant onset of sacral wound pain from sitting in w/c requiring him to transfer back to EOB as described above and returned to R semi-sidelying with min assist for LE management up onto bed. Therapist adjusted the inflation of Roho cushion for improved pressure relief. After pain decreased to a manageable level, pt very motivated to return to sitting EOB so that he can progress to ambulation - same assist as above. Gait training 54ft using PFRW with 1x mod assist when pt turning AD too quickly causing it to tip over towards L but otherwise min assist for balance and intermittently to turn  AD - +2 w/c follow for safety - continues to demo narrow BOS, increased ankle supination, decreased step length, and decreased gait speed. Pt requesting to return back to semi-sidelying once returned to room as sitting on EOB not comfortable on sacral wound. Assessed vitals: BP 134/86 (MAP 102), HR 85bpm, SpO2 94% on RA. MD in/out for morning assessment. After ~20minute R semi-sidelying rest break for pain management pt agreeable to attempt another gait trial - ambulated same distance using PFRW with same assist and +2 for w/c follow - continues to demo above gait impairments. Reassessed vitals after 2nd walk: BP 126/93 (MAP 103), HR 97bpm, SpO2 93% on RA. Pt requesting to stay in bed for remainder of session due to increasing sacral wound pain and upcoming hydrotherapy immediately following this session. Therapist assisted pt in therapeutically positioning on R side in preparation for hydrotherapy and in that positioned performed L UE ROM of wrist flexion/extension and elbow flexion/extension x 20 reps each. Pt left R sidelying with needs in reach and in care of PT for hydrotherapy.  Therapy Documentation Precautions:  Precautions Precautions: Fall Precaution Comments: flaccid LUE (protect from subluxation); sling when OOB for comfort; skin integrity (sacral wound); monitor vitals Required Braces or Orthoses: Sling Restrictions Weight Bearing Restrictions: No  Pain: Reports significant increase in sacral wound pain upon 1 attempt to sit in TIS w/c and requires multiple R semi-sidelying rest breaks for pain management throughout session - unrated - reports pain medication administered prior to therapy session and therapist readjusted inflation of Roho cushion for improved pressure  relief for future sessions.   Therapy/Group: Individual Therapy  Ginny Forth , PT, DPT, CSRS  06/26/2020, 7:52 AM

## 2020-06-26 NOTE — Progress Notes (Addendum)
Physical Therapy Wound Treatment Patient Details  Name: Collin Brown MRN: [id number] Date of Birth: Aug 05, 1986  Today's Date: 06/26/2020 PT Individual Time: 1000-1039 PT Individual Time Calculation (min): 39 min   Subjective  Subjective: Pleasant and agreeable to hydrotherapy.  Patient and Family Stated Goals: None stated at this time.  Date of Onset:  (Unknown) Prior Treatments: dressing changes  Pain Score:  Pt reports increased pain today but states the pain medication is "taking the edge off". Overall tolerated treatment well with minimal complaints of pain throughout session.   Wound Assessment  Pressure Injury 06/08/20 Coccyx Unstageable - Full thickness tissue loss in which the base of the injury is covered by slough (yellow, tan, gray, green or brown) and/or eschar (tan, brown or black) in the wound bed. yellow necrotic tissue in the crease o (Active)  Dressing Type ABD;Barrier Film (skin prep);Gauze (Comment);Moist to dry 06/26/20 1144  Dressing Changed;New drainage 06/26/20 1144  Dressing Change Frequency Daily 06/26/20 1144  State of Healing Non-healing 06/26/20 1144  Site / Wound Assessment Bleeding;Yellow;Pink 06/26/20 1144  % Wound base Red or Granulating 5% 06/26/20 1144  % Wound base Yellow/Fibrinous Exudate 95% (difficult to assess due to bleeding) 06/26/20 1144  % Wound base Black/Eschar 0% 06/26/20 1144  % Wound base Other/Granulation Tissue (Comment) 0% 06/26/20 1144  Peri-wound Assessment Intact;Pink 06/26/20 1144  Wound Length (cm) 3.2 cm 06/25/20 1026  Wound Width (cm) 1.5 cm 06/25/20 1026  Wound Depth (cm) 5 cm 06/25/20 1026  Wound Surface Area (cm^2) 4.8 cm^2 06/25/20 1026  Wound Volume (cm^3) 24 cm^3 06/25/20 1026  Undermining (cm) 4 cm at 12:00; 4.5 cm at 3:00 06/25/20 1026  Margins Unattached edges (unapproximated) 06/26/20 1144  Drainage Amount Copious 06/26/20 1144  Drainage Description Odor;Purulent 06/26/20 1144  Treatment Debridement  (Selective);Hydrotherapy (Pulse lavage);Packing (Saline gauze) 06/26/20 1144   Santyl applied to wound bed prior to applying dressing.     Hydrotherapy Pulsed lavage therapy - wound location: sacrum Pulsed Lavage with Suction (psi): 12 psi Pulsed Lavage with Suction - Normal Saline Used: 1000 mL Pulsed Lavage Tip: Tip with splash shield Selective Debridement Selective Debridement - Location: sacrum Selective Debridement - Tools Used: Forceps;Scissors Selective Debridement - Tissue Removed: yellow necrotic tissue   Wound Assessment and Plan  Wound Therapy - Assess/Plan/Recommendations Wound Therapy - Clinical Statement: Wound bed bleeding with removal of packing from yesterday which made it even more difficult to visualize the inside of the wound in addition to the small opening. Debridement was limited as I did not feel comfortable attempting sharps debridement on an area that could not be properly visualized. This patient will benefit from continued hydrotherapy for selective removal of unviable tissue, to decrease bioburden and promote wound bed healing.  Wound Therapy - Functional Problem List: decreased mobility Factors Delaying/Impairing Wound Healing: Immobility Hydrotherapy Plan: Debridement;Patient/family education;Pulsatile lavage with suction;Dressing change Wound Therapy - Frequency: 6X / week Wound Therapy - Follow Up Recommendations: Home health RN Wound Plan: see above  Wound Therapy Goals- Improve the function of patient's integumentary system by progressing the wound(s) through the phases of wound healing (inflammation - proliferation - remodeling) by: Decrease Necrotic Tissue to: 20 Decrease Necrotic Tissue - Progress: Progressing toward goal Increase Granulation Tissue to: 80 Increase Granulation Tissue - Progress: Progressing toward goal Goals/treatment plan/discharge plan were made with and agreed upon by patient/family: Yes Time For Goal Achievement: 7 days Wound  Therapy - Potential for Goals: Good  Goals will be updated until maximal potential achieved  or discharge criteria met.  Discharge criteria: when goals achieved, discharge from hospital, MD decision/surgical intervention, no progress towards goals, refusal/missing three consecutive treatments without notification or medical reason.  GP     [name] 06/26/2020, 11:50 AM   [name], PT, DPT Acute Rehabilitation Services Pager: [phone] Office: [phone]

## 2020-06-26 NOTE — Patient Care Conference (Signed)
Inpatient RehabilitationTeam Conference and Plan of Care Update Date: 06/26/2020   Time: 10:21 AM    Patient Name: Collin Brown      Medical Record Number: [id number]  Date of Birth: March 12, 1986 Sex: Male         Room/Bed: 4W16C/4W16C-01 Payor Info: Payor: BLUE CROSS BLUE [employer]IELD / Plan: [employer] COMM PPO / Product Type: *No Product type* /    Admit Date/Time:  06/08/2020 12:01 PM  Primary Diagnosis:  Critical illness myopathy  Hospital Problems: Principal Problem:   Critical illness myopathy Active Problems:   Debility   Essential hypertension   Diabetes mellitus, new onset (HCC)   Brachial plexopathy   Sepsis due to undetermined organism (HCC)   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 Sula Soda Care Coodinator Present: Cecile Sheerer, LCSWA;Ima Hafner [name], RN, BSN, CRRN Nurse Present: Margot Ables, LPN PT Present: Serina Cowper, PT OT Present: Jake Shark, OT PPS Coordinator present : Edson Snowball, Park Breed, SLP     Current Status/Progress Goal Weekly Team Focus  Bowel/Bladder   continent of b/b; LBM: 11/15  remain continent of b/b  assist with toileting needs prn   Swallow/Nutrition/ Hydration             ADL's   Min A UB dressing, Max A to Total A LB dressing at bed level, Min to Mod A transfers, poor activity tolerance, pain limiting function.  CGA overall  OOB activity tolerance, weaning O2, ADL transfers, LUE NMR, family ed   Mobility   Fluctuating performance this week, only tolerated bed level mobility and stand pivot transfers to [facility] x2 days limited by sacral wound pain and nausea, prior to that was performing mobility with min A-CGA, gait >170 ft with sweedish walker, and tolerating RA with low level activity  Supervision-CGA overall, min A stairs and car transfers  Activity tolerance,  pain management, L upper extremity NMR and ROM, positioning for pressure relief, functional mobility, gait and stair training, patient/caregiver education   Communication             Safety/Cognition/ Behavioral Observations            Pain   c/o pain to buttocks; prn oxy q4hr, prn diluadid 2mg  q6hr  pain level <6/10  assess QS and prn   Skin   unstageable to cocyxx with daily hydrotherapy  remain free of new skin breakdown  assess skin QS and prn     Discharge Planning:  D/c to home with support from his mother (supervision) and his sister [name]. Fam edu on 11/15 1pm-3pm with mother/sister [name]/Connie.   Team Discussion: Continent B/B, complains of pain to the buttocks d/t unstageable to the cocyxx. Receiving daily hydrotherapy. appropriate pain medication on board. OT reports patient is min assist for upper body, max to total assist for lower body. Goals are set at contact guard overall. PT reports that patient's pain levels are increased from sacral pain, and requesting and extension in stay. SW reports that mother will only be able to provide supervision, sister will be able to provide more care.  Patient on target to meet rehab goals: yes, with an extension in he stay by a week.  *See Care Plan and progress notes for long and short-term goals.   Revisions to Treatment Plan:  Extended stay of a week.  Teaching Needs: Continue family education with mom and sister,  begin wound care teaching.  Current Barriers to Discharge: Inaccessible home environment, Decreased caregiver support, Home enviroment access/layout, Wound care, Lack of/limited family support, Weight, Weight bearing restrictions, Medication compliance and Behavior  Possible Resolutions to Barriers: Continue family education, provide education on wound care and dressing changes,weight bearing precautions, continue current medication regimen, educate on safety awareness.     Medical Summary Current Status: Biggest  complaint is pain after hydrotherapy for his pressure injury, also has left sided shoulder pain- wearining sling, difficulty finding home health woundcare  Barriers to Discharge: Wound care  Barriers to Discharge Comments: Biggest complaint is pain after hydrotherapy for his pressure injury, also has left sided shoulder pain- wearining sling, difficulty finding home health woundcare, nausea Possible Resolutions to [employer] Focus: Dilaudid and Oxycodone for pain control, sling for arm and shoulder pain, padded seats for sacral wound, zofran as needed for nausea   Continued Need for Acute Rehabilitation Level of Care: The patient requires daily medical management by a physician with specialized training in physical medicine and rehabilitation for the following reasons: Direction of a multidisciplinary physical rehabilitation program to maximize functional independence : Yes Medical management of patient stability for increased activity during participation in an intensive rehabilitation regime.: Yes Analysis of laboratory values and/or radiology reports with any subsequent need for medication adjustment and/or medical intervention. : Yes   I attest that I was present, lead the team conference, and concur with the assessment and plan of the team.   Tennis Must 06/26/2020, 1:36 PM

## 2020-06-26 NOTE — Progress Notes (Signed)
[facility] PHYSICAL MEDICINE & REHABILITATION PROGRESS NOTE   Subjective/Complaints: Team conference was held today- extending patient one more week given setback with salmonella and increased pressure injury pain. Cherie has created cushioned chair for him and we are encouraging out of bed as tolerated   ROS:   Pt denies SOB, abd pain, CP, N/V/C/D, and vision changes    No results found. Recent Labs    06/25/20 0738  WBC 8.6  HGB 10.9*  HCT 35.0*  PLT 344   Recent Labs    06/25/20 0738  NA 138  K 4.1  CL 101  CO2 28  GLUCOSE 101*  BUN 8  CREATININE 0.42*  CALCIUM 9.0    Intake/Output Summary (Last 24 hours) at 06/26/2020 1130 Last data filed at 06/26/2020 0300 Gross per 24 hour  Intake 840 ml  Output 1575 ml  Net -735 ml     Pressure Injury 06/08/20 Coccyx Unstageable - Full thickness tissue loss in which the base of the injury is covered by slough (yellow, tan, gray, green or brown) and/or eschar (tan, brown or black) in the wound bed. yellow necrotic tissue in the crease o (Active)  06/08/20 1830 (with Lizabeth Leyden RN)  Location: Coccyx  Location Orientation:   Staging: Unstageable - Full thickness tissue loss in which the base of the injury is covered by slough (yellow, tan, gray, green or brown) and/or eschar (tan, brown or black) in the wound bed.  Wound Description (Comments): yellow necrotic tissue in the crease originally put in as a nonpressure wound  Present on Admission: Yes    Physical Exam: Vital Signs Blood pressure 124/81, pulse 94, temperature 98.3 F (36.8 C), resp. rate 18, height 5\' 10"  (1.778 m), weight (!) 156.9 kg, SpO2 94 %. General: Alert and oriented x 3, No apparent distress HEENT: Head is normocephalic, atraumatic, PERRLA, EOMI, sclera anicteric, oral mucosa pink and moist, dentition intact, ext ear canals clear,  Neck: Supple without JVD or lymphadenopathy Heart: Reg rate and rhythm. No murmurs rubs or gallops Chest: CTA  bilaterally without wheezes, rales, or rhonchi; no distress Abdomen: Soft, non-tender, non-distended, bowel sounds positive. Extremities: No clubbing, cyanosis, or edema. Pulses are 2+  Skin: eschar on toes, improving.  Sacral wound with increasing granulation tissue although full depth of wound difficult to see, s/s drainage. Neuro: Ox3 . Cranial nerves 2-12 are intact. DTR's 1+.  Fine motor coordination is intact. No tremors. Motor function is grossly 5/5 RUE. LUE with ABD/EE,WE weakness ongoing  Musculoskeletal: Full ROM, No pain with AROM or PROM in the neck, trunk, or LE's.  Posture appropriate. Wearing LUE sling   Assessment/Plan: 1. 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  Physiatrist is providing close team supervision and 24 hour management of active medical problems listed below.  Physiatrist and rehab team continue to assess barriers to discharge/monitor patient progress toward functional and medical goals  Care Tool:  Bathing    Body parts bathed by patient: Left arm, Chest, Abdomen, Right upper leg, Left upper leg   Body parts bathed by helper: Right arm     Bathing assist Assist Level: Minimal Assistance - Patient > 75%     Upper Body Dressing/Undressing Upper body dressing   What is the patient wearing?: Pull over shirt    Upper body assist Assist Level: Minimal Assistance - Patient > 75%    Lower Body Dressing/Undressing Lower body  dressing      What is the patient wearing?: Pants     Lower body assist Assist for lower body dressing: Maximal Assistance - Patient 25 - 49%     Toileting Toileting    Toileting assist Assist for toileting: Moderate Assistance - Patient 50 - 74%     Transfers Chair/bed transfer  Transfers assist  Chair/bed transfer activity did not occur: Safety/medical concerns  Chair/bed transfer assist level:  Minimal Assistance - Patient > 75% Chair/bed transfer assistive device: Armrests, Bedrails   Locomotion Ambulation   Ambulation assist   Ambulation activity did not occur: Safety/medical concerns  Assist level: 2 helpers (min A of 1 and +2 w/c follow) Assistive device: Walker-platform Max distance: 13ft   Walk 10 feet activity   Assist  Walk 10 feet activity did not occur: Safety/medical concerns  Assist level: 2 helpers (min A of 1 and +2 w/c follow) Assistive device: Walker-platform   Walk 50 feet activity   Assist Walk 50 feet with 2 turns activity did not occur: Safety/medical concerns (did not perform turns)  Assist level: 2 helpers (min A of 1 and +2 w/c follow) Assistive device: Walker-platform    Walk 150 feet activity   Assist Walk 150 feet activity did not occur: Safety/medical concerns         Walk 10 feet on uneven surface  activity   Assist Walk 10 feet on uneven surfaces activity did not occur: Safety/medical concerns         Wheelchair     Assist Will patient use wheelchair at discharge?: No   Wheelchair activity did not occur: Safety/medical concerns         Wheelchair 50 feet with 2 turns activity    Assist    Wheelchair 50 feet with 2 turns activity did not occur: Safety/medical concerns       Wheelchair 150 feet activity     Assist  Wheelchair 150 feet activity did not occur: Safety/medical concerns       Blood pressure 124/81, pulse 94, temperature 98.3 F (36.8 C), resp. rate 18, height 5\' 10"  (1.778 m), weight (!) 156.9 kg, SpO2 94 %.  Medical Problem List and Plan: 1.  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             - continue therapies as tolerated. Trying to work through pain related to wounds.    -Interdisciplinary Team Conference today  2.  Antithrombotics: -DVT/anticoagulation: Lovenox 85 mg daily.               -antiplatelet therapy: Aspirin 81 mg daily 3. Pain Management: Advil as needed             Oxycodone as needed             11/5: pain improved with 400mg  tid of gabapentin  11/6-9: stable  11/12: continue gabapentin, kpad   -dilaudid for more severe pain   -increased dilaudid to 4mg  30" prior to wound care  11/13- pt reports most of pain is sacral wound- and wound care- doing OK otherwise- con't regimen  11/14- says air mattress helps sacral pain some.   11/15-16: pain continues to be severe around hydrotherapy but patient prefers  to continue with it given its benefits for wound healing. Continue dilaudid and oxycodone prn 4. Mood: Provide emotional support             -antipsychotic agents: N/A 5. Neuropsych: This patient is capable of making decisions on his own behalf. 6. 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 may ultimately need surgery to help expose the wound further. May need to re-visit surgical assessment next week. Surgery has been following and wound is improving. Appreciate WOC nurse f/u 7. Fluids/Electrolytes/Nutrition: encourage po 8.  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  Patient completed 5-day course Solu-Medrol             Supportive care with range of motion             Recommend NCV/EMG as outpatient.  -increased gabapentin as above 9.  New onset diabetes mellitus, hemoglobin A1c 7.2.  NovoLog 4 units 3 times daily, Levemir 15 units twice daily.               CBG (last 3)  Recent Labs    06/25/20 1654 06/25/20 1930 06/26/20 0608  GLUCAP 93 107* 96   11/15-16: CBGs 93-107: continue to monitor.  10.  Hypertension with tachycardia.  Lopressor 50 mg twice daily.               10/31 pt hypotensive yesterday with OT which  wasn't unexpected   -TEDS, Abd binder   -push po fluids   -continue to acclimate, get OOB   -reassured him that this is to be expected   -continue to monitor  11/3: Increased Lopressor to 75mg  BID  11/12 bp  controlled today, tachy not helped by pain 11.  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  Dietary follow-up  11/14- BMI down to 49.63 12. Fever, UCX with 100 Klebsiella  -stool sample also positive for salmonella  -chest xray and exam unremarkable  -blood cultures pending but negative so far  -abx narrowed to augmentin which completed 11/11  -encourage PO  -recheck cbc Monday    13. Hypokalemia: K+ 3.1 on 11/10: supplement Kdur 13/10 BID and repeat tomorrow.   -11/11 k+ 3.4   -continue supplementation   11/14- BMP on Monday 14. Constipation  11/14- LBM 4 days ago- says cannot go on [facility], toilet or bedpan due to being so hard- asked for padded BSC.  Will check with nursing to find one, if possible. If not, maybe PT/OT has an option        LOS: 18 days A FACE TO FACE EVALUATION WAS PERFORMED  Shereese Bonnie P Jerome Otter 06/26/2020, 11:30 AM

## 2020-06-27 ENCOUNTER — Inpatient Hospital Stay (HOSPITAL_COMMUNITY): Payer: BC Managed Care – PPO

## 2020-06-27 LAB — GLUCOSE, CAPILLARY
Glucose-Capillary: 108 mg/dL — ABNORMAL HIGH (ref 70–99)
Glucose-Capillary: 132 mg/dL — ABNORMAL HIGH (ref 70–99)
Glucose-Capillary: 124 mg/dL — ABNORMAL HIGH (ref 70–99)
Glucose-Capillary: 182 mg/dL — ABNORMAL HIGH (ref 70–99)

## 2020-06-27 MED ORDER — OXYCODONE HCL 5 MG PO TABS
5.0000 mg | ORAL_TABLET | Freq: Four times a day (QID) | ORAL | Status: DC | PRN
  Administered 2020-06-27 – 2020-06-28 (×4): 10 mg via ORAL
  Administered 2020-06-29: 5 mg via ORAL
  Administered 2020-06-29 – 2020-07-02 (×10): 10 mg via ORAL
  Administered 2020-07-03: 5 mg via ORAL
  Administered 2020-07-03 – 2020-07-07 (×11): 10 mg via ORAL
  Administered 2020-07-07: 5 mg via ORAL
  Administered 2020-07-08 – 2020-07-13 (×14): 10 mg via ORAL
  Filled 2020-06-27 (×44): qty 2

## 2020-06-27 MED ORDER — OXYCODONE HCL ER 10 MG PO T12A
10.0000 mg | EXTENDED_RELEASE_TABLET | Freq: Two times a day (BID) | ORAL | Status: DC
  Administered 2020-06-27 – 2020-07-03 (×12): 10 mg via ORAL
  Filled 2020-06-27 (×13): qty 1

## 2020-06-27 NOTE — Progress Notes (Signed)
[facility] PHYSICAL MEDICINE & REHABILITATION PROGRESS NOTE   Subjective/Complaints: Mr. Biehn continues to complain of severe pain at pressure injury site. Continues with hydrotherapy. Oxycodone and Dilaudid is only lasting a little more than an hour- discussing long acting Oxycontin and he is agreeable.   ROS:  Pt denies SOB, abd pain, CP, N/V/C/D, and vision changes    No results found. Recent Labs    06/25/20 0738  WBC 8.6  HGB 10.9*  HCT 35.0*  PLT 344   Recent Labs    06/25/20 0738  NA 138  K 4.1  CL 101  CO2 28  GLUCOSE 101*  BUN 8  CREATININE 0.42*  CALCIUM 9.0    Intake/Output Summary (Last 24 hours) at 06/27/2020 1234 Last data filed at 06/27/2020 0700 Gross per 24 hour  Intake 717 ml  Output 700 ml  Net 17 ml     Pressure Injury 06/08/20 Coccyx Unstageable - Full thickness tissue loss in which the base of the injury is covered by slough (yellow, tan, gray, green or brown) and/or eschar (tan, brown or black) in the wound bed. yellow necrotic tissue in the crease o (Active)  06/08/20 1830 (with Lizabeth Leyden RN)  Location: Coccyx  Location Orientation:   Staging: Unstageable - Full thickness tissue loss in which the base of the injury is covered by slough (yellow, tan, gray, green or brown) and/or eschar (tan, brown or black) in the wound bed.  Wound Description (Comments): yellow necrotic tissue in the crease originally put in as a nonpressure wound  Present on Admission: Yes    Physical Exam: Vital Signs Blood pressure (!) 137/92, pulse (!) 103, temperature 98.3 F (36.8 C), resp. rate 14, height 5\' 10"  (1.778 m), weight (!) 156.9 kg, SpO2 95 %. General: Alert and oriented x 3, No apparent distress HEENT: Head is normocephalic, atraumatic, PERRLA, EOMI, sclera anicteric, oral mucosa pink and moist, dentition intact, ext ear canals clear,  Neck: Supple without JVD or lymphadenopathy Heart: Tachycardic. No murmurs rubs or gallops Chest: CTA  bilaterally without wheezes, rales, or rhonchi; no distress Abdomen: Soft, non-tender, non-distended, bowel sounds positive. Extremities: No clubbing, cyanosis, or edema. Pulses are 2+ Skin: eschar on toes, improving.  Sacral wound with increasing granulation tissue although full depth of wound difficult to see, s/s drainage. Neuro: Ox3 . Cranial nerves 2-12 are intact. DTR's 1+.  Fine motor coordination is intact. No tremors. Motor function is grossly 5/5 RUE. LUE with ABD/EE,WE weakness ongoing  Musculoskeletal: Full ROM, No pain with AROM or PROM in the neck, trunk, or LE's.  Posture appropriate. Wearing LUE sling   Assessment/Plan: 1. 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  Physiatrist is providing close team supervision and 24 hour management of active medical problems listed below.  Physiatrist and rehab team continue to assess barriers to discharge/monitor patient progress toward functional and medical goals  Care Tool:  Bathing    Body parts bathed by patient: Left arm, Chest, Abdomen, Right upper leg, Left upper leg   Body parts bathed by helper: Right arm     Bathing assist Assist Level: Minimal Assistance - Patient > 75%     Upper Body Dressing/Undressing Upper body dressing   What is the patient wearing?: Pull over shirt    Upper body assist Assist Level: Minimal Assistance - Patient > 75%    Lower Body Dressing/Undressing Lower body dressing  What is the patient wearing?: Pants     Lower body assist Assist for lower body dressing: Maximal Assistance - Patient 25 - 49%     Toileting Toileting    Toileting assist Assist for toileting: Moderate Assistance - Patient 50 - 74%     Transfers Chair/bed transfer  Transfers assist  Chair/bed transfer activity did not occur: Safety/medical concerns  Chair/bed transfer assist level:  Minimal Assistance - Patient > 75% Chair/bed transfer assistive device: Armrests, Bedrails   Locomotion Ambulation   Ambulation assist   Ambulation activity did not occur: Safety/medical concerns  Assist level: Minimal Assistance - Patient > 75% Assistive device: Walker-platform Max distance: 20 ft   Walk 10 feet activity   Assist  Walk 10 feet activity did not occur: Safety/medical concerns  Assist level: Minimal Assistance - Patient > 75% Assistive device: Walker-platform   Walk 50 feet activity   Assist Walk 50 feet with 2 turns activity did not occur: Safety/medical concerns (did not perform turns)  Assist level: 2 helpers (min A of 1 and +2 w/c follow) Assistive device: Walker-platform    Walk 150 feet activity   Assist Walk 150 feet activity did not occur: Safety/medical concerns         Walk 10 feet on uneven surface  activity   Assist Walk 10 feet on uneven surfaces activity did not occur: Safety/medical concerns         Wheelchair     Assist Will patient use wheelchair at discharge?: No   Wheelchair activity did not occur: Safety/medical concerns         Wheelchair 50 feet with 2 turns activity    Assist    Wheelchair 50 feet with 2 turns activity did not occur: Safety/medical concerns       Wheelchair 150 feet activity     Assist  Wheelchair 150 feet activity did not occur: Safety/medical concerns       Blood pressure (!) 137/92, pulse (!) 103, temperature 98.3 F (36.8 C), resp. rate 14, height 5\' 10"  (1.778 m), weight (!) 156.9 kg, SpO2 95 %.  Medical Problem List and Plan: 1.  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             - continue therapies as tolerated. Trying to work through pain related to wounds.    -Interdisciplinary Team Conference today  2.  Antithrombotics: -DVT/anticoagulation: Lovenox 85 mg daily.               -antiplatelet therapy: Aspirin 81 mg daily 3. Pain Management: Advil as needed             Oxycodone as needed             11/5: pain improved with 400mg  tid of gabapentin  11/6-9: stable  11/12: continue gabapentin, kpad   -dilaudid for more severe pain   -increased dilaudid to 4mg  30" prior to wound care  11/13- pt reports most of pain is sacral wound- and wound care- doing OK otherwise- con't regimen  11/14- says air mattress helps sacral pain some.   11/15-16: pain continues to be severe around hydrotherapy but patient prefers to continue with it given its benefits for wound healing. Continue  dilaudid and oxycodone prn  11/17: Add oxycontin 10mg  q12H and decrease frequency of oxycodone to q6H.  4. Mood: Provide emotional support             -antipsychotic agents: N/A 5. Neuropsych: This patient is capable of making decisions on his own behalf. 6. 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 may ultimately need surgery to help expose the wound further. May need to re-visit surgical assessment next week. Surgery has been following and wound is improving. Appreciate WOC nurse f/u. Having a hard time sitting due to pain from pressure injury.  7. Fluids/Electrolytes/Nutrition: encourage po 8.  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  Patient completed 5-day course Solu-Medrol             Supportive care with range of motion             Recommend NCV/EMG as outpatient.  -increased gabapentin as above 9.  New onset diabetes mellitus, hemoglobin A1c 7.2.  NovoLog 4 units 3 times daily, Levemir 15 units twice daily.               CBG (last 3)  Recent Labs    06/26/20 2116 06/27/20 0608 06/27/20 1117  GLUCAP 114* 108* 132*   11/15-17: mildly elevated,  continue to monitor 10.  Hypertension with tachycardia.  Lopressor 50 mg twice daily.               10/31 pt hypotensive yesterday with OT which wasn't unexpected   -TEDS, Abd binder   -push po fluids   -continue to acclimate, get OOB   -reassured him that this is to be expected   -continue to monitor  11/3: Increased Lopressor to 75mg  BID  11/12 bp  controlled today, tachy not helped by pain 11.  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  Dietary follow-up  11/14- BMI down to 49.63 12. Fever, UCX with 100 Klebsiella  -stool sample also positive for salmonella  -chest xray and exam unremarkable  -blood cultures pending but negative so far  -abx narrowed to augmentin which completed 11/11  -encourage PO  -recheck cbc Monday    13. Hypokalemia: K+ 3.1 on 11/10: supplement Kdur Tuesday BID and repeat tomorrow.   -11/11 k+ 3.4   -continue supplementation   11/14- BMP on Monday 14. Constipation  11/14- LBM 4 days ago- says cannot go on [facility], toilet or bedpan due to being so hard- asked for padded BSC.  Will check with nursing to find one, if possible. If not, maybe PT/OT has an option        LOS: 19 days A FACE TO FACE EVALUATION WAS PERFORMED  12/14 Anne Boltz 06/27/2020, 12:34 PM

## 2020-06-27 NOTE — Progress Notes (Addendum)
Placed patient on BIPAP, previous settings via FFM with 2L O2. Per order. Tolerating well at this time.

## 2020-06-27 NOTE — Progress Notes (Signed)
Recreational Therapy Session Note  Patient Details  Name: Collin Brown MRN: [id number] Date of Birth: [birth year] Today's Date: 06/27/2020  Pain: c/o pain to wound, repositioning offered, pt requesting meds-RN aware Skilled Therapeutic Interventions/Progress Updates: Discussed pet visitation policy with pt and provided "Pet Visitation Checklist" .  Pt texting his mom to inform her of this information and hoping this can be arranged in the near future.  Session focused on activity tolerance, ambulation with PFRW and standing balance.  Pt ambulated with +2 assist, min assist of 1st person, 2nd person for O2 management and w/c follow.  Pt ambulated 20', 45', 75' 140' with ~3 min seated rest breaks in between.  Pt stood at table top with min assist for black jack card game with out UE support.  Therapy/Group: Co-Treatment Aylene Acoff 06/27/2020, 3:28 PM

## 2020-06-27 NOTE — Progress Notes (Signed)
Occupational Therapy Session Note  Patient Details  Name: Collin Brown MRN: [id number] Date of Birth: 11/09/1985  Today's Date: 06/27/2020 OT Individual Time: 1100-1200 OT Individual Time Calculation (min): 60 min    Short Term Goals: Week 3:  OT Short Term Goal 1 (Week 3): STG=LTG 2/2 ELOS  Skilled Therapeutic Interventions/Progress Updates:    Pt received supine with c/o pain in his sacrum but reporting he has a new pain medication on board and is willing to participate with therapy. Pt completed bed mobility to EOB with mod A for lifting trunk. Pt maintained EOB sitting balance with supervision. He donned sling with max A, initially attempting to put on platform of RW but this caused excruciating pain at the shoulder. Pt stood from EOB with min A. He attempted to urinate in standing, with mod A for clothing management and urinal placement, but was unable to void before needing to sit down. Pt transferred to the TIS w/c with a ROHO cushion. He initially tolerated sitting upright but then requested to attempt a tilt which caused high pain in his sacrum. He was unable to be repositioned to alleviate pain and transferred back to bed. In sidelying kinesiotape was applied to the patient's L shoulder to promote proper glenohumeral positioning and support. Slight sublux present, tender to the touch. Coordinated care with several other OT's and PT's re proper w/c positioning for maximal pain relief from sacrum wound. Obtained a standard w/c to promote more neutral pelvic tilt and alignment. PT will trial in later session today. Pt was left sidelying with all needs met.   Therapy Documentation Precautions:  Precautions Precautions: Fall Precaution Comments: flaccid LUE (protect from subluxation); sling when OOB for comfort; skin integrity (sacral wound); monitor vitals Required Braces or Orthoses: Sling Restrictions Weight Bearing Restrictions: No   Therapy/Group: Individual Therapy   H   06/27/2020, 6:32 AM 

## 2020-06-27 NOTE — Progress Notes (Signed)
Physical Therapy Session Note  Patient Details  Name: Collin Brown MRN: [id number] Date of Birth: 1986/07/17  Today's Date: 06/27/2020 PT Individual Time: 1345-1445 PT Individual Time Calculation (min): 60 min   Short Term Goals: Week 1:  PT Short Term Goal 1 (Week 1): Pt will perform bed mobility with maxA +1 person PT Short Term Goal 1 - Progress (Week 1): Met PT Short Term Goal 2 (Week 1): Pt will perform bed<>chair transfers with maxA and LRAD PT Short Term Goal 2 - Progress (Week 1): Met PT Short Term Goal 3 (Week 1): Pt will tolerate sitting in chair/recliner for >1 hour with stable vital signs PT Short Term Goal 3 - Progress (Week 1): Met PT Short Term Goal 4 (Week 1): Pt will begin initiating pre-gait training with maxA and LRAD PT Short Term Goal 4 - Progress (Week 1): Met Week 2:  PT Short Term Goal 1 (Week 2): Patient will perform bed mobility with CGA without use of hospital bed functions. PT Short Term Goal 1 - Progress (Week 2): Progressing toward goal PT Short Term Goal 2 (Week 2): Patient will perform basic transfers with min A consitently. PT Short Term Goal 2 - Progress (Week 2): Progressing toward goal PT Short Term Goal 3 (Week 2): Patient will ambulate 100 ft using LRAD with mod A. PT Short Term Goal 3 - Progress (Week 2): Progressing toward goal Week 3:  PT Short Term Goal 1 (Week 3): Patient will perform bed mobility with CGA without use of hospital bed functions. PT Short Term Goal 2 (Week 3): Patient will perform basic transfers with min A consistently. PT Short Term Goal 3 (Week 3): Patient will ambulate 100 ft using LRAD with min A. PT Short Term Goal 4 (Week 3): Patient will initiate stair training. Week 4:     Skilled Therapeutic Interventions/Progress Updates:    PAIN 8/10 w/activity/sitting.  Rest in sidelying as needed.  Nursing notified of request for pain meds.  Pt initially sidelying and agreeable to session.  Applewood insert, anittippers, and  legrests obtained for bari wc to trial in effort to improve sitting comfort. Side to sit w/mod assist of 1, additional time, painful transition. Sit to stand w/min assist of 1 to L PFRW. Gait trials as follows/second person assist for wc and 02 management.  Pt on 2L 02 via [place].  02 sats 97%, HR 110-120 w/activity.  28f, 438f [id number] w/min assist of 1, seconed person as above.  Mild instability w/fatigue.  Pt tolerates 2-3 min max sitting rest break due to pain at wound site.   Pt rested in sidelying on air matress x 10 min the proceeded w/the following:  Standing stood 2 min x 1, 1.25 min x 1 and played blackjack at elevated nighttable for distraction/standing tolerance. Pt limited in standing by nausea Pt limited in sitting by pain. Sit tosidelying w/mod assist.  Pt rolls, scoots w/min assist, mult times during session Sit to stand repeated mult times, min assist each time, PFRW or bedside table used for balance in standing. At end of session,  Pt repositioned for comfort and pressure relief in R sidelying, declined further rx. Due to pain.  Nursing arrived to provide pain meds.    Therapy Documentation Precautions:  Precautions Precautions: Fall Precaution Comments: flaccid LUE (protect from subluxation); sling when OOB for comfort; skin integrity (sacral wound); monitor vitals Required Braces or Orthoses: Sling Restrictions Weight Bearing Restrictions: No   Therapy/Group: Individual Therapy  BarbPamala Hurry  Ernst Bowler, PT   Jerrilyn Cairo 06/27/2020, 2:53 PM

## 2020-06-27 NOTE — Progress Notes (Signed)
Occupational Therapy Session Note  Patient Details  Name: Collin Brown MRN: [id number] Date of Birth: [birth year]  Today's Date: 06/27/2020 OT Individual Time: 3419-6222 OT Individual Time Calculation (min): 57 min    Short Term Goals: Week 1:  OT Short Term Goal 1 (Week 1): Pt will complete sit<>stand with mod assist +2 in preperation for LB ADLs OT Short Term Goal 1 - Progress (Week 1): Met OT Short Term Goal 2 (Week 1): Pt will achieve full PROM elbow flexion with minimal to no pain. OT Short Term Goal 2 - Progress (Week 1): Progressing toward goal OT Short Term Goal 3 (Week 1): Pt will complete UB dressing with mod assist. OT Short Term Goal 3 - Progress (Week 1): Met OT Short Term Goal 4 (Week 1): Pt will complete UB bathing using long handled sponge as needed with min assist. OT Short Term Goal 4 - Progress (Week 1): Met  Skilled Therapeutic Interventions/Progress Updates:    1:1. Pt received in bed with urated sacral pain and spends significant amount of time reviewing progress/ups/downs of rehab since last seen this OT including rustration with pain limiting progress, falls with staff and udates on hydrotherapy. Pt provided with support and encouragement that progres and pain will continue to get better but takes time. Pt agreeable to t BADL at EOB. Pt requires MAX A and increased time for supine>itting EOB with deep breathing cues to maintain O2 at >90%. Pt sits EOB about 10 min for UB bathing with MOD  and UB dressing with MIN A. Pt with iproved independence using reacher to push shirt over L shoulder. Pt able to thread BLE into pants with MIN and elects to transition to back to bed and keep pants below hips as hydrotherapy happening next. Pt reporting mild diziness but teds on and BP 130/82. Exited session with pt seated in bed, exit alarm on and call light in reach   Therapy Documentation Precautions:  Precautions Precautions: Fall Precaution Comments: flaccid LUE (protect  from subluxation); sling when OOB for comfort; skin integrity (sacral wound); monitor vitals Required Braces or Orthoses: Sling Restrictions Weight Bearing Restrictions: No General:   Vital Signs: Therapy Vitals Temp: 98.3 F (36.8 C) Pulse Rate: (!) 103 Resp: 14 BP: (!) 137/92 Oxygen Therapy SpO2: 95 % Pain: Pain Assessment Pain Scale: 0-10 Pain Score: 8  Pain Type: Acute pain Pain Location: Arm Pain Orientation: Left Pain Descriptors / Indicators: Aching Pain Frequency: Constant Pain Onset: On-going Patients Stated Pain Goal: 3 Pain Intervention(s): Medication (See eMAR);Repositioned ADL: ADL Upper Body Bathing: Maximal assistance Where Assessed-Upper Body Bathing: Edge of bed Lower Body Bathing: Other (comment) (+2 assist) Where Assessed-Lower Body Bathing: Edge of bed, Bed level (attempted to wash buttocks but quickly fatigued; therefore completed at bed level sidelying) Upper Body Dressing: Maximal assistance Where Assessed-Upper Body Dressing: Edge of bed Lower Body Dressing: Other (Comment) (attempted in standing but fatigued; bed level +2 total assist) Where Assessed-Lower Body Dressing: Edge of bed, Bed level Toileting: Dependent Where Assessed-Toileting: Bed level (urinal and incontinent of bowel) Vision   Perception    Praxis   Exercises:   Other Treatments:     Therapy/Group: Individual Therapy  Tonny Branch 06/27/2020, 6:49 AM

## 2020-06-27 NOTE — Progress Notes (Addendum)
Physical Therapy Wound Treatment Patient Details  Name: Collin Brown MRN: [id number] Date of Birth: 05/21/1986  Today's Date: 06/27/2020 PT Individual Time: 1010-1030 PT Individual Time Calculation (min): 20 min   Subjective  Subjective: Pleasant and agreeable to hydrotherapy.  Patient and Family Stated Goals: None stated at this time.  Date of Onset:  (Unknown) Prior Treatments: dressing changes  Pain Score: Pain Score: 8   Wound Assessment  Pressure Injury 06/08/20 Coccyx Unstageable - Full thickness tissue loss in which the base of the injury is covered by slough (yellow, tan, gray, green or brown) and/or eschar (tan, brown or black) in the wound bed. yellow necrotic tissue in the crease o (Active)  Dressing Type Barrier Film (skin prep);Gauze (Comment);ABD 06/27/20 1000  Dressing Changed;Clean;Dry;Intact 06/27/20 1000  Dressing Change Frequency Daily 06/27/20 1000  State of Healing Non-healing 06/26/20 1144  Site / Wound Assessment Bleeding;Other (Comment);Yellow;Red 06/27/20 1000  % Wound base Red or Granulating 50% 06/27/20 1000  % Wound base Yellow/Fibrinous Exudate 25% 06/27/20 1000  % Wound base Black/Eschar 0% 06/27/20 1000  % Wound base Other/Granulation Tissue (Comment) 25% 06/27/20 1000  Peri-wound Assessment Intact 06/27/20 1000  Wound Length (cm) 3.2 cm 06/25/20 1026  Wound Width (cm) 1.5 cm 06/25/20 1026  Wound Depth (cm) 5 cm 06/25/20 1026  Wound Surface Area (cm^2) 4.8 cm^2 06/25/20 1026  Wound Volume (cm^3) 24 cm^3 06/25/20 1026  Undermining (cm) 4 cm at 12:00; 4.5 cm at 3:00 06/25/20 1026  Margins Unattached edges (unapproximated) 06/27/20 1000  Drainage Amount Copious 06/27/20 1000  Drainage Description Odor;Purulent 06/27/20 1000  Treatment Hydrotherapy (Pulse lavage);Packing (Saline gauze); Santyl applied to wound bed prior to applying dressing.  06/27/20 1000      Hydrotherapy Pulsed lavage therapy - wound location: sacrum Pulsed Lavage with  Suction (psi): 12 psi Pulsed Lavage with Suction - Normal Saline Used: 1000 mL Pulsed Lavage Tip: Tip with splash shield Selective Debridement Selective Debridement - Location: sacrum   Wound Assessment and Plan  Wound Therapy - Assess/Plan/Recommendations Wound Therapy - Clinical Statement: Wound bed with bleeding with removal of packing.  Wound bed difficult to visualize due to bleeding and small opening.  Did have small amount yellow necrosis on edges of interior wound.  Wound bed with questionable connective tissue vs necrosis visible - difficult to visualize. Pt tolerated well.  Will continue to benefit from hydrotherapy for selective removal of unviable tissue.  Wound Therapy - Functional Problem List: decreased mobility Factors Delaying/Impairing Wound Healing: Immobility Hydrotherapy Plan: Debridement;Patient/family education;Pulsatile lavage with suction;Dressing change Wound Therapy - Frequency: 6X / week Wound Therapy - Follow Up Recommendations: Home health RN Wound Plan: see above  Wound Therapy Goals- Improve the function of patient's integumentary system by progressing the wound(s) through the phases of wound healing (inflammation - proliferation - remodeling) by: Decrease Necrotic Tissue to: 20 Decrease Necrotic Tissue - Progress: Progressing toward goal Increase Granulation Tissue to: 80 Increase Granulation Tissue - Progress: Progressing toward goal Goals/treatment plan/discharge plan were made with and agreed upon by patient/family: Yes Time For Goal Achievement: 7 days Wound Therapy - Potential for Goals: Good  Goals will be updated until maximal potential achieved or discharge criteria met.  Discharge criteria: when goals achieved, discharge from hospital, MD decision/surgical intervention, no progress towards goals, refusal/missing three consecutive treatments without notification or medical reason.  GP    Abran Richard, PT Acute Rehab Services Pager [phone] [facility] [place] 06/27/2020, 10:50 AM

## 2020-06-27 NOTE — Progress Notes (Signed)
Patient ID: Collin Brown, male   DOB: October 09, 1985, 34 y.o.   MRN: [id number]  SW still waiting on follow-up about HHPT/OT/SN referral sent to [facility].    SW met with pt in room to provide updates from team conference, and change in d/c date to 11/26. Pt on facetime call with his mother at time of visit. SW informed them both on challenges with securing HH and will continue to work on this. Pt scheduled for family edu 11/22 10am-3pm with mother [name], sister [name], and [name].   Loralee Pacas, MSW, [place] Office: [phone] Cell: [phone] Fax: [phone]

## 2020-06-28 ENCOUNTER — Inpatient Hospital Stay (HOSPITAL_COMMUNITY): Payer: BC Managed Care – PPO

## 2020-06-28 ENCOUNTER — Inpatient Hospital Stay (HOSPITAL_COMMUNITY): Payer: BC Managed Care – PPO | Admitting: Physical Therapy

## 2020-06-28 LAB — CBC WITH DIFFERENTIAL/PLATELET
Abs Immature Granulocytes: 0.24 10*3/uL — ABNORMAL HIGH (ref 0.00–0.07)
Basophils Absolute: 0.1 10*3/uL (ref 0.0–0.1)
Basophils Relative: 1 %
Eosinophils Absolute: 0.1 10*3/uL (ref 0.0–0.5)
Eosinophils Relative: 1 %
HCT: 37.3 % — ABNORMAL LOW (ref 39.0–52.0)
Hemoglobin: 11.7 g/dL — ABNORMAL LOW (ref 13.0–17.0)
Immature Granulocytes: 3 %
Lymphocytes Relative: 18 %
Lymphs Abs: 1.6 10*3/uL (ref 0.7–4.0)
MCH: 28.6 pg (ref 26.0–34.0)
MCHC: 31.4 g/dL (ref 30.0–36.0)
MCV: 91.2 fL (ref 80.0–100.0)
Monocytes Absolute: 0.7 10*3/uL (ref 0.1–1.0)
Monocytes Relative: 8 %
Neutro Abs: 6.5 10*3/uL (ref 1.7–7.7)
Neutrophils Relative %: 69 %
Platelets: 389 10*3/uL (ref 150–400)
RBC: 4.09 MIL/uL — ABNORMAL LOW (ref 4.22–5.81)
RDW: 16.5 % — ABNORMAL HIGH (ref 11.5–15.5)
WBC: 9.2 10*3/uL (ref 4.0–10.5)
nRBC: 0.2 % (ref 0.0–0.2)

## 2020-06-28 LAB — GLUCOSE, CAPILLARY
Glucose-Capillary: 107 mg/dL — ABNORMAL HIGH (ref 70–99)
Glucose-Capillary: 111 mg/dL — ABNORMAL HIGH (ref 70–99)
Glucose-Capillary: 109 mg/dL — ABNORMAL HIGH (ref 70–99)
Glucose-Capillary: 146 mg/dL — ABNORMAL HIGH (ref 70–99)

## 2020-06-28 LAB — COMPREHENSIVE METABOLIC PANEL
ALT: 24 U/L (ref 0–44)
AST: 17 U/L (ref 15–41)
Albumin: 2.9 g/dL — ABNORMAL LOW (ref 3.5–5.0)
Alkaline Phosphatase: 54 U/L (ref 38–126)
Anion gap: 12 (ref 5–15)
BUN: 9 mg/dL (ref 6–20)
CO2: 24 mmol/L (ref 22–32)
Calcium: 9.4 mg/dL (ref 8.9–10.3)
Chloride: 103 mmol/L (ref 98–111)
Creatinine, Ser: 0.53 mg/dL — ABNORMAL LOW (ref 0.61–1.24)
GFR, Estimated: >60 mL/min (ref >60)
Glucose, Bld: 114 mg/dL — ABNORMAL HIGH (ref 70–99)
Potassium: 4 mmol/L (ref 3.5–5.1)
Sodium: 139 mmol/L (ref 135–145)
Total Bilirubin: 0.4 mg/dL (ref 0.3–1.2)
Total Protein: 6 g/dL — ABNORMAL LOW (ref 6.5–8.1)

## 2020-06-28 LAB — PROCALCITONIN: Procalcitonin: 0.36 ng/mL

## 2020-06-28 MED ORDER — DOCUSATE SODIUM 100 MG PO CAPS
100.0000 mg | ORAL_CAPSULE | Freq: Three times a day (TID) | ORAL | Status: DC
  Administered 2020-06-28 – 2020-06-30 (×7): 100 mg via ORAL
  Filled 2020-06-28 (×8): qty 1

## 2020-06-28 MED ORDER — DAKINS (1/4 STRENGTH) 0.125 % EX SOLN
Freq: Two times a day (BID) | CUTANEOUS | Status: AC
  Filled 2020-06-28: qty 473

## 2020-06-28 NOTE — Progress Notes (Signed)
Per physical therapy --patient with elevated BP and HR with minimal activity as well as increase in pain. Note plans for debridement tomorrow. Question SIRS. Will order procalcitonin, CBC w/diff and CMET for work up. Provider on call updated

## 2020-06-28 NOTE — Progress Notes (Signed)
[facility] PHYSICAL MEDICINE & REHABILITATION PROGRESS NOTE   Subjective/Complaints: Patient has increased foul odor and copious dark drainage from pressure injury. WOC recommends reconsulting surgery to see if tunnel may be opened to view underlying tissues.   ROS:  Pt denies SOB, abd pain, CP, N/V/C/D, and vision changes    No results found. No results for input(s): WBC, HGB, HCT, PLT in the last 72 hours. No results for input(s): NA, K, CL, CO2, GLUCOSE, BUN, CREATININE, CALCIUM in the last 72 hours.  Intake/Output Summary (Last 24 hours) at 06/28/2020 1216 Last data filed at 06/28/2020 0700 Gross per 24 hour  Intake 880 ml  Output 650 ml  Net 230 ml     Pressure Injury 06/08/20 Coccyx Unstageable - Full thickness tissue loss in which the base of the injury is covered by slough (yellow, tan, gray, green or brown) and/or eschar (tan, brown or black) in the wound bed. yellow necrotic tissue in the crease o (Active)  06/08/20 1830 (with Lizabeth Leyden RN)  Location: Coccyx  Location Orientation:   Staging: Unstageable - Full thickness tissue loss in which the base of the injury is covered by slough (yellow, tan, gray, green or brown) and/or eschar (tan, brown or black) in the wound bed.  Wound Description (Comments): yellow necrotic tissue in the crease originally put in as a nonpressure wound  Present on Admission: Yes    Physical Exam: Vital Signs Blood pressure 136/84, pulse 97, temperature 97.6 F (36.4 C), resp. rate 18, height 5\' 10"  (1.778 m), weight (!) 156.9 kg, SpO2 97 %. General: Alert and oriented x 3, No apparent distress HEENT: Head is normocephalic, atraumatic, PERRLA, EOMI, sclera anicteric, oral mucosa pink and moist, dentition intact, ext ear canals clear,  Neck: Supple without JVD or lymphadenopathy Heart: Reg rate and rhythm. No murmurs rubs or gallops Chest: CTA bilaterally without wheezes, rales, or rhonchi; no distress Abdomen: Soft, non-tender,  non-distended, bowel sounds positive. Extremities: No clubbing, cyanosis, or edema. Pulses are 2+ Skin: eschar on toes, improving.  Sacral wound with increasing granulation tissue although full depth of wound difficult to see, increasing copious, dark drainage Neuro: AOx3 Cranial nerves 2-12 are intact. DTR's 1+.  Fine motor coordination is intact. No tremors. Motor function is grossly 5/5 RUE. LUE with ABD/EE,WE weakness ongoing  Musculoskeletal: Full ROM, No pain with AROM or PROM in the neck, trunk, or LE's.  Posture appropriate. Wearing LUE sling   Assessment/Plan: 1. 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  Physiatrist is providing close team supervision and 24 hour management of active medical problems listed below.  Physiatrist and rehab team continue to assess barriers to discharge/monitor patient progress toward functional and medical goals  Care Tool:  Bathing    Body parts bathed by patient: Left arm, Chest, Abdomen, Right upper leg, Left upper leg   Body parts bathed by helper: Right arm     Bathing assist Assist Level: Minimal Assistance - Patient > 75%     Upper Body Dressing/Undressing Upper body dressing   What is the patient wearing?: Pull over shirt    Upper body assist Assist Level: Minimal Assistance - Patient > 75%    Lower Body Dressing/Undressing Lower body dressing      What is the patient wearing?: Pants     Lower body assist Assist for lower body dressing: Maximal Assistance - Patient 25 - 49%  Toileting Toileting    Toileting assist Assist for toileting: Moderate Assistance - Patient 50 - 74%     Transfers Chair/bed transfer  Transfers assist  Chair/bed transfer activity did not occur: Safety/medical concerns  Chair/bed transfer assist level: Minimal Assistance - Patient > 75% Chair/bed transfer assistive device:  Armrests, Bedrails   Locomotion Ambulation   Ambulation assist   Ambulation activity did not occur: Safety/medical concerns  Assist level: 2 helpers Assistive device: Walker-platform Max distance: 140   Walk 10 feet activity   Assist  Walk 10 feet activity did not occur: Safety/medical concerns  Assist level: 2 helpers Assistive device: Walker-platform   Walk 50 feet activity   Assist Walk 50 feet with 2 turns activity did not occur: Safety/medical concerns (did not perform turns)  Assist level: 2 helpers Assistive device: Walker-platform    Walk 150 feet activity   Assist Walk 150 feet activity did not occur: Safety/medical concerns  Assist level: 2 helpers      Walk 10 feet on uneven surface  activity   Assist Walk 10 feet on uneven surfaces activity did not occur: Safety/medical concerns         Wheelchair     Assist Will patient use wheelchair at discharge?: No   Wheelchair activity did not occur: Safety/medical concerns         Wheelchair 50 feet with 2 turns activity    Assist    Wheelchair 50 feet with 2 turns activity did not occur: Safety/medical concerns       Wheelchair 150 feet activity     Assist  Wheelchair 150 feet activity did not occur: Safety/medical concerns       Blood pressure 136/84, pulse 97, temperature 97.6 F (36.4 C), resp. rate 18, height 5\' 10"  (1.778 m), weight (!) 156.9 kg, SpO2 97 %.  Medical Problem List and Plan: 1.  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             - continue therapies as tolerated. Trying to work through pain related to wounds.   -Interdisciplinary Team Conference today  2.  Antithrombotics: -DVT/anticoagulation: Lovenox 85 mg daily.               -antiplatelet therapy: Aspirin  81 mg daily 3. Pain Management: Advil as needed             Oxycodone as needed             11/5: pain improved with 400mg  tid of gabapentin  11/6-9: stable  11/12: continue gabapentin, kpad   -dilaudid for more severe pain   -increased dilaudid to 4mg  30" prior to wound care  11/13- pt reports most of pain is sacral wound- and wound care- doing OK otherwise- con't regimen  11/14- says air mattress helps sacral pain some.   11/15-16: pain continues to be severe around hydrotherapy but patient prefers to continue with it given its benefits for wound healing. Continue dilaudid and oxycodone prn  11/17: Add oxycontin 10mg  q12H and decrease frequency of oxycodone to q6H.   11/18: pain is better controlled and he was able to walk much more yesterday.  4. Mood: Provide emotional support             -  antipsychotic agents: N/A 5. Neuropsych: This patient is capable of making decisions on his own behalf. 6. 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 may ultimately need surgery to help expose the wound further.  Appreciate WOC nurse f/u. Having a hard time sitting due to pain from pressure injury. Will re-consult surgery today given tunneling and more copious, dark, foul smelling drainage.  7. Fluids/Electrolytes/Nutrition: encourage po 8.  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  Patient completed 5-day course Solu-Medrol             Supportive care with range of motion             Recommend NCV/EMG as outpatient.  -increased gabapentin as above 9.  New onset diabetes mellitus, hemoglobin A1c 7.2.  NovoLog 4 units 3 times daily, Levemir 15 units twice daily.               CBG (last 3)  Recent Labs    06/27/20 1943 06/28/20 0643 06/28/20 1144  GLUCAP 182* 107* 111*   11/15-18: mildly elevated to 182- continue to monitor 10.  Hypertension with tachycardia.  Lopressor 50 mg  twice daily.               10/31 pt hypotensive yesterday with OT which wasn't unexpected   -TEDS, Abd binder   -push po fluids   -continue to acclimate, get OOB   -reassured him that this is to be expected   -continue to monitor  11/3: Increased Lopressor to 75mg  BID  11/12 bp  controlled today, tachy not helped by pain 11.  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  Dietary follow-up  11/14- BMI down to 49.63 12. Fever, UCX with 100 Klebsiella  -stool sample also positive for salmonella  -chest xray and exam unremarkable  -blood cultures pending but negative so far  -abx narrowed to augmentin which completed 11/11  -encourage PO  -recheck cbc Monday    13. Hypokalemia: K+ 3.1 on 11/10: supplement Kdur 13/10 BID and repeat tomorrow.   -11/11 k+ 3.4   -continue supplementation   11/14- BMP on Monday 14. Constipation  11/14- LBM 4 days ago- says cannot go on [facility], toilet or bedpan due to being so hard- asked for padded BSC.  Will check with nursing to find one, if possible. If not, maybe PT/OT has an option        LOS: 20 days A FACE TO FACE EVALUATION WAS PERFORMED  [facility] P Luise Yamamoto 06/28/2020, 12:16 PM

## 2020-06-28 NOTE — Progress Notes (Signed)
Physical Therapy Session Note  Patient Details  Name: Collin Brown MRN: [id number] Date of Birth: 1986-01-29  Today's Date: 06/28/2020 PT Individual Time: 8299-3716 PT Individual Time Calculation (min): 59 min   Short Term Goals: Week 3:  PT Short Term Goal 1 (Week 3): Patient will perform bed mobility with CGA without use of hospital bed functions. PT Short Term Goal 2 (Week 3): Patient will perform basic transfers with min A consistently. PT Short Term Goal 3 (Week 3): Patient will ambulate 100 ft using LRAD with min A. PT Short Term Goal 4 (Week 3): Patient will initiate stair training.  Skilled Therapeutic Interventions/Progress Updates:    Pt received R sidelying in bed and agreeable to therapy session. Received and maintained on RA throughout with SpO2 >91% with activity. Assessed vitals in sidelying: BP 140/96 (MAP 107), HR 103bpm, SpO2 94%. R sidelying>sitting EOB with mod assist for trunk upright, cuing and additional time for positioning of R UE to assist with trunk. Sitting vitals: BP 138/91 (MAP 107), HR 116bpm, SpO2 98%. Donned L UE sling and shoes max assist. Sit>stands EOB> L PFRW with mod assist for lifting into stand and increased time to lift up - assist for L UE placement on/off platform. Gait training ~51ft using L PFRW with heavy min assist of 1 for balance due to increased postural sway today - +2 assist w/c follow. Reassessed vitals: BP 142/103 (MAP 112), HR 123bpm, SpO2 96%. Transported back to room in w/c. R stand pivot w/c>EOB using L PFRW with min assist for balance and pt demoing improving management of AD when turning. Sit>supine with min assist for L UE management and R LE onto bed. Notified Carlena Sax, RN of pt's elevated BP and HR. After ~38minute R sidelying therapeutic rest break pt agreeable to attempt another bout of gait training. Sidelying>sitting mod assist as above. Assessed vitals in sitting: BP 138/99 (MAP 108), HR 121bpm, SpO2 92% Gait training ~81ft using L  PFRW with lighter min assist this trial as pt demos decreased postural sway - continues to have narrow BOS with decreased step lengths and decreased gait speed. Reassessed vitals: BP 134/95 (MAP 109), HR 132bpm, SpO2 95%. Transported back to room in w/c and R stand pivot to EOB as described above. Doffed shoes and sling mod assist. Sit>supine min assist as above. Reassessed vitals in R sidelying: BP 138/91 (MAP 104), HR 113bpm, SpO2 92% with pt reporting feeling "nauseous". Agreeable to bed level therapy.  Focused on L UE NMR and ROM: - PROM elbow flexion/extension x15 reps - AAROM elbow flexion/extension 3x5 reps while having pt hold water bottle in L hand as external cue for increased bicep activation while targeting grasp strength - pushing arm forward with elbow extension and shoulder protraction against slight resistance 2x5 reps - pulling arm backwards with finger flexion to grasp, elbow flexion, and scapular retraction against slight resistance 2x5 reps Pt reports the UE exercises are almost more fatiguing than the gait training. Assisted pt in therapeutically positioning in R sidelying and left with needs in reach.    Therapy Documentation Precautions:  Precautions Precautions: Fall Precaution Comments: flaccid LUE (protect from subluxation); sling when OOB for comfort; skin integrity (sacral wound); monitor vitals Required Braces or Orthoses: Sling Restrictions Weight Bearing Restrictions: No  Pain:   Reports sacral pain, unrated, but most severe in sitting - medication administered prior to session - provided R sidelying rest breaks for pain management.   Therapy/Group: Individual Therapy  Jenevieve Kirschbaum Verdell Face , PT,  DPT, CSRS  06/28/2020, 3:44 PM

## 2020-06-28 NOTE — Consult Note (Signed)
WOC Nurse Consult Note: Patient receiving care in [facility] [phone] A collaborative discussion was conducted with Conni Slipper, PT about topical approaches to the wound on the sacral/coccyx area with tunneling.  Per Vernona Rieger, the wound has a heavy foul odor and a tunnel that prevents viewing what is going on at the tissue level in the tunnel.  I have changed topical orders to 3 days of twice daily gauze moistened with 1/4 % Dakin's solution.  I have reached out the Dr. Carlis Abbott whom I understand to be covering this patient, asking for surgical evaluation and input, with the thought being surgical services might be able to open the tunnel for viewing the tissues that lie within.   Helmut Muster, RN, MSN, CWOCN, CNS-BC, pager [phone]

## 2020-06-28 NOTE — Progress Notes (Signed)
Physical Therapy Wound Treatment Patient Details  Name: Collin Brown MRN: [id number] Date of Birth: 1986/07/31  Today's Date: 06/28/2020 PT Individual Time: 1000-1043 PT Individual Time Calculation (min): 43 min   Subjective  Subjective: Expresses continued severe pain with any sitting activity.  Patient and Family Stated Goals: None stated at this time.  Date of Onset:  (Unknown) Prior Treatments: dressing changes  Pain Score:  Pt reports pain at times.   Wound Assessment  Pressure Injury 06/08/20 Coccyx Unstageable - Full thickness tissue loss in which the base of the injury is covered by slough (yellow, tan, gray, green or brown) and/or eschar (tan, brown or black) in the wound bed. yellow necrotic tissue in the crease o (Active)  Dressing Type ABD;Barrier Film (skin prep);Gauze (Comment);Moist to dry 06/28/20 1336  Dressing Changed;Clean;Dry;Intact 06/28/20 1336  Dressing Change Frequency Daily 06/28/20 1336  State of Healing Non-healing 06/28/20 1336  Site / Wound Assessment Yellow;Pink;Bleeding 06/28/20 1336  % Wound base Red or Granulating 10% 06/28/20 1336  % Wound base Yellow/Fibrinous Exudate 90% 06/28/20 1336  % Wound base Black/Eschar 0% 06/28/20 1336  % Wound base Other/Granulation Tissue (Comment) 0% 06/28/20 1336  Peri-wound Assessment Intact 06/28/20 1336  Wound Length (cm) 3.2 cm 06/25/20 1026  Wound Width (cm) 1.5 cm 06/25/20 1026  Wound Depth (cm) 5 cm 06/25/20 1026  Wound Surface Area (cm^2) 4.8 cm^2 06/25/20 1026  Wound Volume (cm^3) 24 cm^3 06/25/20 1026  Undermining (cm) 4 cm at 12:00; 4.5 cm at 3:00 06/25/20 1026  Margins Unattached edges (unapproximated) 06/27/20 1000  Drainage Amount Copious 06/28/20 1336  Drainage Description Odor;Purulent 06/28/20 1336  Treatment Hydrotherapy (Pulse lavage);Packing (Saline gauze) 06/28/20 1336   Santyl applied to wound bed prior to applying dressing.     Hydrotherapy Pulsed lavage therapy - wound location:  sacrum Pulsed Lavage with Suction (psi): 12 psi Pulsed Lavage with Suction - Normal Saline Used: 1000 mL Pulsed Lavage Tip: Tip with splash shield   Wound Assessment and Plan  Wound Therapy - Assess/Plan/Recommendations Wound Therapy - Clinical Statement: No sharps debridement performed this session due to difficulty visualizing the wound. Discussed with [name], [place] RN, continued foul odor and difficulty visualizing the wound. Pt reports extreme pain in sitting in the right to central portion of the wound, where there is a tunnel ~3:00. Recommend surgery consult. This patient will benefit from continued hydrotherapy for selective removal of unviable tissue, to decrease bioburden and promote wound bed healing.  Wound Therapy - Functional Problem List: decreased mobility Factors Delaying/Impairing Wound Healing: Immobility Hydrotherapy Plan: Debridement;Patient/family education;Pulsatile lavage with suction;Dressing change Wound Therapy - Frequency: 6X / week Wound Therapy - Follow Up Recommendations: Home health RN Wound Plan: see above  Wound Therapy Goals- Improve the function of patient's integumentary system by progressing the wound(s) through the phases of wound healing (inflammation - proliferation - remodeling) by: Decrease Necrotic Tissue to: 20 Decrease Necrotic Tissue - Progress: Progressing toward goal Increase Granulation Tissue to: 80 Increase Granulation Tissue - Progress: Progressing toward goal Goals/treatment plan/discharge plan were made with and agreed upon by patient/family: Yes Time For Goal Achievement: 7 days Wound Therapy - Potential for Goals: Good  Goals will be updated until maximal potential achieved or discharge criteria met.  Discharge criteria: when goals achieved, discharge from hospital, MD decision/surgical intervention, no progress towards goals, refusal/missing three consecutive treatments without notification or medical reason.  GP     [name] 06/28/2020, 2:08 PM   [name], PT, DPT  Acute Rehabilitation Services Pager: [phone] Office: [phone]

## 2020-06-28 NOTE — Progress Notes (Signed)
Patient ID: Collin Brown, male   DOB: 1986/02/03, 34 y.o.   MRN: [id number]  SW still waiting on follow-up about HHPT/OT/SN [facility].   SW sent referral to [facility]. SW waiting on follow-up.  Cecile Sheerer, MSW, LCSWA Office: [phone] Cell: [phone] Fax: [phone]

## 2020-06-28 NOTE — Consult Note (Signed)
Collin Brown [facility] 02-15-1986  [id number].    Requesting MD: Dr. Carlis Abbott Chief Complaint/Reason for Consult: Sacral Decub  HPI: Collin Brown is a 34 y.o. male who we were asked to see for a sacral decub.   Patient was initially admitted on 9/23 with Covid.  Patient required intubation.  He was treated with IV remdesivir and IV steroids.  Hospital course was complicated by acute metabolic encephalopathy.  He was ultimately discharged to rehab on 10/29.  During admission he was found to have new onset diabetes with a hemoglobin A1c of 7.2.  Patient found to have sacral wound that was not present prior to admission.  Patient is been followed by [facility] nurse. Currently getting daily santyl dressing changes and hydrotherapy. Patient having increased foul drainage and difficulty with hydro given small opening. Patient notes the area is painful, especially with palpation and when he tries to sit/lay on the area. We were asked to see.   ROS: Review of Systems  Respiratory: Negative for cough.   Cardiovascular: Negative for chest pain.  Gastrointestinal: Negative for abdominal pain, nausea and vomiting.  Genitourinary: Negative for dysuria.  Skin:       Decub  All other systems reviewed and are negative.   Family History  Problem Relation Age of Onset  . Diabetes Mellitus II Mother   . Hypertension Mother   . Diabetes Mellitus II Father   . High blood pressure Father     Past Medical History:  Diagnosis Date  . Critical illness myopathy   . Diabetes mellitus without complication (HCC)   . Hearing loss in right ear   . Hypertension   . Sleep apnea     Past Surgical History:  Procedure Laterality Date  . TONSILLECTOMY      Social History:  reports that he has never smoked. He has never used smokeless tobacco. He reports that he does not drink alcohol and does not use drugs.  Allergies: No Known Allergies  Medications Prior to Admission  Medication Sig Dispense Refill  .  amoxicillin-clavulanate (AUGMENTIN) 875-125 MG tablet Take 1 tablet by mouth 2 (two) times daily. (Patient not taking: Reported on 05/03/2020) 20 tablet 0     Physical Exam: Blood pressure 136/84, pulse 97, temperature 97.6 F (36.4 C), resp. rate 18, height 5\' 10"  (1.778 m), weight (!) 156.9 kg, SpO2 97 %. General: pleasant, WD/WN white male who is laying in bed in NAD HEENT: head is normocephalic, atraumatic.  Sclera are noninjected.  PERRL.  Ears and nose without any masses or lesions.  Mouth is pink and moist. Dentition fair Heart: regular, rate, and rhythm.  Normal s1,s2. No obvious murmurs, gallops, or rubs noted.  Palpable pedal pulses bilaterally  Lungs: CTAB, no wheezes, rhonchi, or rales noted.  Respiratory effort nonlabored Abd: Soft, NT/ND, +BS, no masses, hernias, or organomegaly MS: no BUE edema. Non pitting edema of LE b/l. Calves soft and nontender Skin: warm and dry with no masses, lesions, or rashes Psych: A&Ox4 with an appropriate affect Neuro: cranial nerves grossly intact, equal strength in BUE/BLE bilaterally, normal speech, thought process intact Sacral wound: Patient with 3cm x 1.5 cm opening at skin level as seen below. This tracks 8cm deep. There is 3-4cm undermining in all directions. There is foul smelling, grey drainage from the wound. Periwound is clean with no signs of cellulitis.      Results for orders placed or performed during the hospital encounter of 06/08/20 (from the past 48  hour(s))  Glucose, capillary     Status: Abnormal   Collection Time: 06/26/20  4:18 PM  Result Value Ref Range   Glucose-Capillary 164 (H) 70 - 99 mg/dL    Comment: Glucose reference range applies only to samples taken after fasting for at least 8 hours.  Glucose, capillary     Status: Abnormal   Collection Time: 06/26/20  7:57 PM  Result Value Ref Range   Glucose-Capillary 122 (H) 70 - 99 mg/dL    Comment: Glucose reference range applies only to samples taken after fasting for  at least 8 hours.  Glucose, capillary     Status: Abnormal   Collection Time: 06/26/20  9:16 PM  Result Value Ref Range   Glucose-Capillary 114 (H) 70 - 99 mg/dL    Comment: Glucose reference range applies only to samples taken after fasting for at least 8 hours.  Glucose, capillary     Status: Abnormal   Collection Time: 06/27/20  6:08 AM  Result Value Ref Range   Glucose-Capillary 108 (H) 70 - 99 mg/dL    Comment: Glucose reference range applies only to samples taken after fasting for at least 8 hours.  Glucose, capillary     Status: Abnormal   Collection Time: 06/27/20 11:17 AM  Result Value Ref Range   Glucose-Capillary 132 (H) 70 - 99 mg/dL    Comment: Glucose reference range applies only to samples taken after fasting for at least 8 hours.  Glucose, capillary     Status: Abnormal   Collection Time: 06/27/20  4:47 PM  Result Value Ref Range   Glucose-Capillary 124 (H) 70 - 99 mg/dL    Comment: Glucose reference range applies only to samples taken after fasting for at least 8 hours.  Glucose, capillary     Status: Abnormal   Collection Time: 06/27/20  7:43 PM  Result Value Ref Range   Glucose-Capillary 182 (H) 70 - 99 mg/dL    Comment: Glucose reference range applies only to samples taken after fasting for at least 8 hours.  Glucose, capillary     Status: Abnormal   Collection Time: 06/28/20  6:43 AM  Result Value Ref Range   Glucose-Capillary 107 (H) 70 - 99 mg/dL    Comment: Glucose reference range applies only to samples taken after fasting for at least 8 hours.  Glucose, capillary     Status: Abnormal   Collection Time: 06/28/20 11:44 AM  Result Value Ref Range   Glucose-Capillary 111 (H) 70 - 99 mg/dL    Comment: Glucose reference range applies only to samples taken after fasting for at least 8 hours.   Comment 1 Notify RN    No results found.  Anti-infectives (From admission, onward)   Start     Dose/Rate Route Frequency Ordered Stop   06/18/20 2000   amoxicillin-clavulanate (AUGMENTIN) 875-125 MG per tablet 1 tablet        1 tablet Oral Every 12 hours 06/18/20 1346 06/21/20 0842   06/17/20 2359  piperacillin-tazobactam (ZOSYN) IVPB 3.375 g  Status:  Discontinued        3.375 g 12.5 mL/hr over 240 Minutes Intravenous Every 8 hours 06/16/20 2353 06/17/20 0013   06/17/20 0800  vancomycin (VANCOREADY) IVPB 1250 mg/250 mL  Status:  Discontinued        1,250 mg 166.7 mL/hr over 90 Minutes Intravenous Every 8 hours 06/17/20 0258 06/18/20 1346   06/17/20 0030  vancomycin (VANCOREADY) IVPB 2000 mg/400 mL  2,000 mg 200 mL/hr over 120 Minutes Intravenous  Once 06/16/20 2353 06/17/20 0435   06/17/20 0030  piperacillin-tazobactam (ZOSYN) IVPB 3.375 g  Status:  Discontinued        3.375 g 12.5 mL/hr over 240 Minutes Intravenous Every 8 hours 06/17/20 0013 06/18/20 1346       Assessment/Plan Dm2 HTN Hx of COVID 9/23  Sacral Wound - This is a 34 y.o. male with a sacral wound. Recent hospitalization on 9/23 with COVID where he was ultimately discharged to rehab on 10/29. Feel patient will benefit from being taken to the OR to open the sacral wound to allow for healing by secondary intention. Will discuss with patient and obtain consent. Plan for OR in the AM if patient is agreeable. Continue with current wound care recs as outline by WOCN (santyl wtd dressing changes, avoid pressure with air mattress, etc). We will follow with you.  FEN - NPO at midnight VTE - SCDs, hold blood thinners at midnight ID - None currently. Cefotetan periop  Jacinto Halim, [facility] Surgery 06/28/2020, 1:43 PM Please see Amion for pager number during day hours 7:00am-4:30pm

## 2020-06-28 NOTE — Progress Notes (Signed)
Occupational Therapy Session Note  Patient Details  Name: Collin Brown MRN: [id number] Date of Birth: 02/16/1986  Today's Date: 06/28/2020 OT Individual Time: 1100-1200 OT Individual Time Calculation (min): 60 min    Short Term Goals: Week 1:  OT Short Term Goal 1 (Week 1): Pt will complete sit<>stand with mod assist +2 in preperation for LB ADLs OT Short Term Goal 1 - Progress (Week 1): Met OT Short Term Goal 2 (Week 1): Pt will achieve full PROM elbow flexion with minimal to no pain. OT Short Term Goal 2 - Progress (Week 1): Progressing toward goal OT Short Term Goal 3 (Week 1): Pt will complete UB dressing with mod assist. OT Short Term Goal 3 - Progress (Week 1): Met OT Short Term Goal 4 (Week 1): Pt will complete UB bathing using long handled sponge as needed with min assist. OT Short Term Goal 4 - Progress (Week 1): Met  Skilled Therapeutic Interventions/Progress Updates:    1;1. Pt received in bed agreeable to OT with pain in sacrum but improvement compared to yesterday. Pt completes supine>sitting EOB with MOD A and improved trunk elevation from yesterday. Pt completes seated grooming face washing and oral care with set up overall for sitting balance and use of LUE in bimanual tasks. With sling donned, Pt completes 2x trials of static standing up to 30 seconds with HR remaining 100-110 during sitting and standing trials. MIN nausea and SOB noted, but O2 >95%. Pt completes sidelying LUE AAROM for shoulder flex/ext, elbow flex/ext, wrist flex/ext, int ext rotation and scap mobilization with tactile cues to decrease shoulder copensatory movements as well as facilitate larger ROM (keeping hsouder flexion below 90*). Exited session with pt seated in bed, exit alarm on and call light in reach   Therapy Documentation Precautions:  Precautions Precautions: Fall Precaution Comments: flaccid LUE (protect from subluxation); sling when OOB for comfort; skin integrity (sacral wound);  monitor vitals Required Braces or Orthoses: Sling Restrictions Weight Bearing Restrictions: No General:   Vital Signs: Therapy Vitals Temp: 97.6 F (36.4 C) Pulse Rate: 97 Resp: 18 BP: 136/84 Patient Position (if appropriate): Lying Oxygen Therapy SpO2: 97 % O2 Device: CPAP O2 Flow Rate (L/min): 3 L/min Pain: Pain Assessment Pain Scale: 0-10 Pain Score: 9  Pain Type: Acute pain Pain Location: Back Pain Orientation: Lower Pain Descriptors / Indicators: Aching Pain Frequency: Constant Pain Onset: On-going Patients Stated Pain Goal: 3 Pain Intervention(s): Medication (See eMAR);Repositioned Multiple Pain Sites: No ADL: ADL Upper Body Bathing: Maximal assistance Where Assessed-Upper Body Bathing: Edge of bed Lower Body Bathing: Other (comment) (+2 assist) Where Assessed-Lower Body Bathing: Edge of bed, Bed level (attempted to wash buttocks but quickly fatigued; therefore completed at bed level sidelying) Upper Body Dressing: Maximal assistance Where Assessed-Upper Body Dressing: Edge of bed Lower Body Dressing: Other (Comment) (attempted in standing but fatigued; bed level +2 total assist) Where Assessed-Lower Body Dressing: Edge of bed, Bed level Toileting: Dependent Where Assessed-Toileting: Bed level (urinal and incontinent of bowel) Vision   Perception    Praxis   Exercises:   Other Treatments:     Therapy/Group: Individual Therapy  Tonny Branch 06/28/2020, 6:53 AM

## 2020-06-28 NOTE — Progress Notes (Signed)
Physical Therapy Session Note  Patient Details  Name: Collin Brown MRN: [id number] Date of Birth: 1986/02/10  Today's Date: 06/28/2020 PT Individual Time: 0800-0915 PT Individual Time Calculation (min): 75 min   Short Term Goals: Week 3:  PT Short Term Goal 1 (Week 3): Patient will perform bed mobility with CGA without use of hospital bed functions. PT Short Term Goal 2 (Week 3): Patient will perform basic transfers with min A consistently. PT Short Term Goal 3 (Week 3): Patient will ambulate 100 ft using LRAD with min A. PT Short Term Goal 4 (Week 3): Patient will initiate stair training.  Skilled Therapeutic Interventions/Progress Updates:     Patient in bed upon PT arrival. Patient alert and agreeable to PT session. Patient reported 4-7/10 sacral wound pain during session, RN made aware. PT provided repositioning, rest breaks, and distraction as pain interventions throughout session. Noted increased drainage from patient's wound, saturated through his dressing, brief, and on the paper chuck pad. RN and PA made aware, stated that the drainage has been occurring, and instructed PT to replace external dressing and brief and allow hydrotherapy to assess patient's wound later this morning. This PT changed his dressing as instructed and doffed the soiled brief and donned a new one during session.   Vitals: Patient on RA throughout session, SPO2 >96% throughout. Sitting: HR 117, SPO2 98% on RA Standing: HR 133, SPO2 96% on RA BP in lying after standing with symptoms of feeling light headed: 138/87  Therapeutic Activity: Bed Mobility: Donned B thigh high TED hose, pants, and B socks with total A for time management and energy conservation bed level. Patient performed rolling R<>supine to don pants with min A, provided verbal cues for bringing his L leg and arm across to roll. He performed scooting L with min A in the bed in R side-lying to avoid sheering over the patient's wound. Provided  cues for scooting his hips, shoulders, and legs in segments for increased independence with mobility and positioning. Patient performed supine to/from sit with mod-min A with use of bed rail, increased assist due to air mattress. Provided verbal cues for L arm management and paced breathing with mobility. Patient sat EOB with supervision as L upper extremity sling was donned with total A.  Transfers: Patient performed sit to/from stand x1 with 2 attempts due to increased sacral pain with mobility with mod-min A from an elevated bed. Provided verbal cues for scooting forward, forward weight shift, and increased breath support. Patient stood <1 min as PT pulled up his pants in standing with total A. Required min A-CGA for standing balance. Patient complained of feeling light headed and returned to sitting, HR elevated see vitals above. Patient's HR and symptoms recovered WNL <2 min in lying.  Repositioned patient in the bed in R side-lying for comfort due increased pain and fatigue. Patient declined further mobility at this time. Discussed positioning for improved sitting tolerance. Patient reports only tolerated 2-3 min seated in manual w/c with forward trunk lean to reduce pressure. Stated he does not tolerate the tilt feature using the tilt in space. Assessed Roho cushion, inflation seems appropriate, will perform assessment with patient seating on the cushion as able.   Patient in R side-lying with pillows behind his back, shoulders, and hips, between his legs, under his L arm, and his head at end of session with breaks locked and all needs within reach.   Therapy Documentation Precautions:  Precautions Precautions: Fall Precaution Comments: flaccid LUE (protect from  subluxation); sling when OOB for comfort; skin integrity (sacral wound); monitor vitals Required Braces or Orthoses: Sling Restrictions Weight Bearing Restrictions: No   Therapy/Group: Individual Therapy  Collin Brown L Collin Brown PT,  DPT  06/28/2020, 12:54 PM

## 2020-06-29 ENCOUNTER — Inpatient Hospital Stay (HOSPITAL_COMMUNITY): Payer: BC Managed Care – PPO | Admitting: Anesthesiology

## 2020-06-29 ENCOUNTER — Encounter (HOSPITAL_COMMUNITY)
Admission: RE | Disposition: A | Discharge status: Home Health | Payer: Self-pay | Source: Intra-hospital | Attending: Physical Medicine & Rehabilitation

## 2020-06-29 ENCOUNTER — Inpatient Hospital Stay (HOSPITAL_COMMUNITY): Payer: BC Managed Care – PPO | Admitting: Physical Therapy

## 2020-06-29 ENCOUNTER — Inpatient Hospital Stay (HOSPITAL_COMMUNITY): Payer: BC Managed Care – PPO

## 2020-06-29 HISTORY — PX: WOUND DEBRIDEMENT: SHX247

## 2020-06-29 LAB — SURGICAL PCR SCREEN
MRSA, PCR: NEGATIVE
Staphylococcus aureus: NEGATIVE

## 2020-06-29 LAB — GLUCOSE, CAPILLARY
Glucose-Capillary: 110 mg/dL — ABNORMAL HIGH (ref 70–99)
Glucose-Capillary: 132 mg/dL — ABNORMAL HIGH (ref 70–99)
Glucose-Capillary: 142 mg/dL — ABNORMAL HIGH (ref 70–99)
Glucose-Capillary: 107 mg/dL — ABNORMAL HIGH (ref 70–99)
Glucose-Capillary: 111 mg/dL — ABNORMAL HIGH (ref 70–99)

## 2020-06-29 LAB — PROCALCITONIN: Procalcitonin: <0.1 ng/mL

## 2020-06-29 SURGERY — DEBRIDEMENT, WOUND
Anesthesia: General | Site: Buttocks

## 2020-06-29 MED ORDER — FENTANYL CITRATE (PF) 250 MCG/5ML IJ SOLN
INTRAMUSCULAR | Status: AC
  Filled 2020-06-29: qty 5

## 2020-06-29 MED ORDER — ONDANSETRON HCL 4 MG/2ML IJ SOLN
INTRAMUSCULAR | Status: DC | PRN
  Administered 2020-06-29: 4 mg via INTRAVENOUS

## 2020-06-29 MED ORDER — SUGAMMADEX SODIUM 200 MG/2ML IV SOLN
INTRAVENOUS | Status: DC | PRN
  Administered 2020-06-29: 400 mg via INTRAVENOUS

## 2020-06-29 MED ORDER — PROPOFOL 10 MG/ML IV BOLUS
INTRAVENOUS | Status: AC
  Filled 2020-06-29: qty 40

## 2020-06-29 MED ORDER — PROPOFOL 10 MG/ML IV BOLUS
INTRAVENOUS | Status: DC | PRN
  Administered 2020-06-29: 200 mg via INTRAVENOUS

## 2020-06-29 MED ORDER — BUPIVACAINE HCL (PF) 0.25 % IJ SOLN
INTRAMUSCULAR | Status: AC
  Filled 2020-06-29: qty 30

## 2020-06-29 MED ORDER — SUCCINYLCHOLINE CHLORIDE 200 MG/10ML IV SOSY
PREFILLED_SYRINGE | INTRAVENOUS | Status: DC | PRN
  Administered 2020-06-29: 200 mg via INTRAVENOUS

## 2020-06-29 MED ORDER — OXYCODONE HCL 5 MG/5ML PO SOLN: 5.0000 mg | Freq: Once | ORAL | Status: DC | PRN

## 2020-06-29 MED ORDER — OXYCODONE HCL 5 MG PO TABS: 5.0000 mg | ORAL_TABLET | Freq: Once | ORAL | Status: DC | PRN

## 2020-06-29 MED ORDER — LIDOCAINE 2% (20 MG/ML) 5 ML SYRINGE
INTRAMUSCULAR | Status: DC | PRN
  Administered 2020-06-29: 80 mg via INTRAVENOUS

## 2020-06-29 MED ORDER — MIDAZOLAM HCL 5 MG/5ML IJ SOLN
INTRAMUSCULAR | Status: DC | PRN
  Administered 2020-06-29: 2 mg via INTRAVENOUS

## 2020-06-29 MED ORDER — DAKINS (1/4 STRENGTH) 0.125 % EX SOLN
Freq: Once | CUTANEOUS | Status: DC
  Filled 2020-06-29: qty 473

## 2020-06-29 MED ORDER — MIDAZOLAM HCL 2 MG/2ML IJ SOLN
INTRAMUSCULAR | Status: AC
  Filled 2020-06-29: qty 2

## 2020-06-29 MED ORDER — LACTATED RINGERS IV SOLN: INTRAVENOUS | Status: DC | PRN

## 2020-06-29 MED ORDER — FENTANYL CITRATE (PF) 100 MCG/2ML IJ SOLN
INTRAMUSCULAR | Status: AC
  Filled 2020-06-29: qty 2

## 2020-06-29 MED ORDER — FENTANYL CITRATE (PF) 100 MCG/2ML IJ SOLN
25.0000 ug | INTRAMUSCULAR | Status: DC | PRN
  Administered 2020-06-29 (×2): 50 ug via INTRAVENOUS

## 2020-06-29 MED ORDER — DEXTROSE 5 % IV SOLN
INTRAVENOUS | Status: DC | PRN
  Administered 2020-06-29: 3 g via INTRAVENOUS

## 2020-06-29 MED ORDER — FENTANYL CITRATE (PF) 250 MCG/5ML IJ SOLN
INTRAMUSCULAR | Status: DC | PRN
  Administered 2020-06-29 (×2): 100 ug via INTRAVENOUS

## 2020-06-29 MED ORDER — ROCURONIUM BROMIDE 10 MG/ML (PF) SYRINGE
PREFILLED_SYRINGE | INTRAVENOUS | Status: DC | PRN
  Administered 2020-06-29: 50 mg via INTRAVENOUS

## 2020-06-29 MED ORDER — 0.9 % SODIUM CHLORIDE (POUR BTL) OPTIME
TOPICAL | Status: DC | PRN
  Administered 2020-06-29: 1000 mL

## 2020-06-29 MED ORDER — SUGAMMADEX SODIUM 500 MG/5ML IV SOLN
INTRAVENOUS | Status: AC
  Filled 2020-06-29: qty 5

## 2020-06-29 MED ORDER — ONDANSETRON HCL 4 MG/2ML IJ SOLN: 4.0000 mg | Freq: Once | INTRAMUSCULAR | Status: DC | PRN

## 2020-06-29 SURGICAL SUPPLY — 36 items
ADH SKN CLS APL DERMABOND .7 (GAUZE/BANDAGES/DRESSINGS) ×1
APL PRP STRL LF DISP 70% ISPRP (MISCELLANEOUS) ×1
BNDG GAUZE ELAST 4 BULKY (GAUZE/BANDAGES/DRESSINGS) ×2 IMPLANT
CANISTER SUCT 3000ML PPV (MISCELLANEOUS) IMPLANT
CHLORAPREP W/TINT 26 (MISCELLANEOUS) ×2 IMPLANT
COVER SURGICAL LIGHT HANDLE (MISCELLANEOUS) ×2 IMPLANT
COVER WAND RF STERILE (DRAPES) ×2 IMPLANT
DECANTER SPIKE VIAL GLASS SM (MISCELLANEOUS) ×4 IMPLANT
DERMABOND ADVANCED (GAUZE/BANDAGES/DRESSINGS) ×1
DERMABOND ADVANCED .7 DNX12 (GAUZE/BANDAGES/DRESSINGS) ×1 IMPLANT
DRAPE LAPAROTOMY 100X72 PEDS (DRAPES) ×2 IMPLANT
ELECT REM PT RETURN 9FT ADLT (ELECTROSURGICAL) ×2
ELECTRODE REM PT RTRN 9FT ADLT (ELECTROSURGICAL) ×1 IMPLANT
GLOVE BIO SURGEON STRL SZ7.5 (GLOVE) ×2 IMPLANT
GLOVE BIOGEL PI IND STRL 8 (GLOVE) ×1 IMPLANT
GLOVE BIOGEL PI INDICATOR 8 (GLOVE) ×1
GOWN STRL REUS W/ TWL LRG LVL3 (GOWN DISPOSABLE) ×1 IMPLANT
GOWN STRL REUS W/ TWL XL LVL3 (GOWN DISPOSABLE) ×1 IMPLANT
GOWN STRL REUS W/TWL LRG LVL3 (GOWN DISPOSABLE) ×2
GOWN STRL REUS W/TWL XL LVL3 (GOWN DISPOSABLE) ×2
KIT BASIN OR (CUSTOM PROCEDURE TRAY) ×2 IMPLANT
KIT TURNOVER KIT B (KITS) ×2 IMPLANT
NDL HYPO 25GX1X1/2 BEV (NEEDLE) ×1 IMPLANT
NEEDLE HYPO 25GX1X1/2 BEV (NEEDLE) ×2 IMPLANT
NS IRRIG 1000ML POUR BTL (IV SOLUTION) ×2 IMPLANT
PACK GENERAL/GYN (CUSTOM PROCEDURE TRAY) ×2 IMPLANT
PAD ABD 8X10 STRL (GAUZE/BANDAGES/DRESSINGS) ×2 IMPLANT
PAD ARMBOARD 7.5X6 YLW CONV (MISCELLANEOUS) ×2 IMPLANT
PENCIL SMOKE EVACUATOR (MISCELLANEOUS) ×2 IMPLANT
SUT MNCRL AB 4-0 PS2 18 (SUTURE) ×2 IMPLANT
SUT VIC AB 3-0 SH 27 (SUTURE) ×2
SUT VIC AB 3-0 SH 27XBRD (SUTURE) ×1 IMPLANT
SYR CONTROL 10ML LL (SYRINGE) ×2 IMPLANT
TAPE PAPER 3X10 WHT MICROPORE (GAUZE/BANDAGES/DRESSINGS) ×1 IMPLANT
TOWEL GREEN STERILE (TOWEL DISPOSABLE) ×2 IMPLANT
TOWEL GREEN STERILE FF (TOWEL DISPOSABLE) ×2 IMPLANT

## 2020-06-29 NOTE — Anesthesia Procedure Notes (Addendum)
Procedure Name: Intubation Date/Time: 06/29/2020 8:00 AM Performed by: Trinna Post., CRNA Pre-anesthesia Checklist: Patient identified, Emergency Drugs available, Suction available, Patient being monitored and Timeout performed Patient Re-evaluated:Patient Re-evaluated prior to induction Oxygen Delivery Method: Circle system utilized Preoxygenation: Pre-oxygenation with 100% oxygen Induction Type: IV induction, Rapid sequence and Cricoid Pressure applied Laryngoscope Size: Mac and 4 Grade View: Grade I Tube type: Oral Tube size: 7.5 mm Number of attempts: 1 Airway Equipment and Method: Stylet Placement Confirmation: ETT inserted through vocal cords under direct vision,  positive ETCO2 and breath sounds checked- equal and bilateral Secured at: 22 cm Tube secured with: Tape Dental Injury: Teeth and Oropharynx as per pre-operative assessment

## 2020-06-29 NOTE — Progress Notes (Signed)
Physical Therapy Note  Patient Details  Name: DAYTONA RETANA MRN: [id number] Date of Birth: 29-Mar-1986 Today's Date: 06/29/2020    Noted pt for surgical debridement of sacral wound this morning. Hydrotherapy session held and received clarification from surgery PA that hydro to resume tomorrow. Will follow-up 11/20 as able.    Marylynn Pearson 06/29/2020, 2:50 PM   Conni Slipper, PT, DPT Acute Rehabilitation Services Pager: [phone] Office: [phone]

## 2020-06-29 NOTE — Progress Notes (Addendum)
Patient ID: Collin Brown, male   DOB: 08-Aug-1986, 34 y.o.   MRN: [id number]  SW received denial paperwork from pt and pt mother from his employer denying his STD and needed to send in appropriate paperwork. SW faxed over appeal request with appropriate clinicals to [facility] [employer]  And [facility] [phone]).   SW waiting on follow-up from [facility] to see if his policy is in network. SW waiting on follow-up.  Cecile Sheerer, MSW, LCSWA Office: [phone] Cell: [phone] Fax: [phone]

## 2020-06-29 NOTE — Progress Notes (Signed)
Per General Surgery hold ASPIRIN 81 mg daily and Lovenox until follow up tomorrow by Gen Surg team and if no acute blood loss or drainage then can resume .

## 2020-06-29 NOTE — Op Note (Signed)
06/29/2020  8:28 AM  PATIENT:  Collin Brown  34 y.o. male  PRE-OPERATIVE DIAGNOSIS:  sacral wound  POST-OPERATIVE DIAGNOSIS:  sacral wound 3.5 x4 x 6 cm  PROCEDURE:  Procedure(s): DEBRIDEMENT SACRAL WOUND (N/A)  SURGEON:  Surgeon(s) and Role:    Axel Filler, MD - Primary   ANESTHESIA:   local and general  EBL:  minimal   BLOOD ADMINISTERED:none  DRAINS: none   LOCAL MEDICATIONS USED:  NONE  SPECIMEN:  No Specimen  DISPOSITION OF SPECIMEN:  N/A  COUNTS:  YES  TOURNIQUET:  * No tourniquets in log *  DICTATION: .Dragon Dictation  Indication procedure: Patient is a 34 year old male, with a sacral decub.  This had some necrotic tissue deep in the wound.  Patient was counseled taken back to the operating room for the MRI of his sacral decub.  Findings: Patient had a 3.5 x 4 x 6 cm decub at the end of the debridement.  The sacral decub did appear to penetrate deep onto the surface of the sacrum.  There was some minimal periosteum that was debrided from the surface of the sacrum.  Details procedure: After the patient was consented he was taken back to the operating room placed in supine position with bilateral SCDs in place.  He underwent general trach intubation.  Patient was then placed in the prone position.  Patient is prepped draped sterile fashion.  A time was called all facts verified.  At this time the decub appeared to track to the right side.  This was unroofed with a #10 blade.  At this time this allowed me to visualize the deeper portion of the wound.  There was some necrotic tissue that was on the sacrum.  This was debrided with a curette.  At this time the necrotic tissue was cauterized and debrided.  The area was irrigated out with sterile saline.  The rest of the sacral decub appeared to be healthy.  The area was then measured.  This appeared to be 3-1/2 cm x 4 cm x 6 cm.  At this time saline soaked Kerlix was packed into the wound.  This was dressed  with ABD pad and tape.  Patient tolerated the procedure well was taken to the recovery in stable condition.  Excisional debridement:  1.  Progress note or procedure note with a detailed description of the procedure.  2.  Tool used for debridement: curette, scapel  3.  Frequency of surgical debridement.   first  4.  Measurement of total devitalized tissue (wound surface) before and after surgical debridement.   3.5x6x4cm  5.  Area and depth of devitalized tissue removed from wound.  3.5x6x4cm  6.  Blood loss and description of tissue removed.  none  7.  Evidence of the progress of the wound's response to treatment.  A.  Current wound volume (current dimensions and depth).  3.5x6x4cm  B.  Presence (and extent of) of infection.  none  C.  Presence (and extent of) of non viable tissue.  yes  D.  Other material in the wound that is expected to inhibit healing.  non  8.  Was there any viable tissue removed (measurements): no   PLAN OF CARE: Admit to inpatient   PATIENT DISPOSITION:  PACU - hemodynamically stable.   Delay start of Pharmacological VTE agent (>24hrs) due to surgical blood loss or risk of bleeding: not applicable

## 2020-06-29 NOTE — Progress Notes (Signed)
Day of Surgery   Subjective/Chief Complaint: No acute changes   Objective: Vital signs in last 24 hours: Temp:  [98.2 F (36.8 C)-98.3 F (36.8 C)] 98.3 F (36.8 C) (11/19 0523) Pulse Rate:  [82-123] 101 (11/19 0523) Resp:  [16-18] 18 (11/19 0523) BP: (113-145)/(73-103) 130/92 (11/19 0523) SpO2:  [93 %-96 %] 96 % (11/19 0523) Last BM Date: 06/27/20  Intake/Output from previous day: 11/18 0701 - 11/19 0700 In: 540 [P.O.:540] Out: 850 [Urine:850] Intake/Output this shift: No intake/output data recorded.  General: pleasant, WD/WN white male who is laying in bed in NAD HEENT: head is normocephalic, atraumatic.  Sclera are noninjected.  PERRL.  Ears and nose without any masses or lesions.  Mouth is pink and moist. Dentition fair Heart: regular, rate, and rhythm.  Normal s1,s2. No obvious murmurs, gallops, or rubs noted.  Palpable pedal pulses bilaterally  Lungs: CTAB, no wheezes, rhonchi, or rales noted.  Respiratory effort nonlabored Abd: Soft, NT/ND, +BS, no masses, hernias, or organomegaly MS: no BUE edema. Non pitting edema of LE b/l. Calves soft and nontender Skin: warm and dry with no masses, lesions, or rashes Psych: A&Ox4 with an appropriate affect Neuro: cranial nerves grossly intact, equal strength in BUE/BLE bilaterally, normal speech, thought process intact Sacral wound: Patient with 3cm x 1.5 cm opening at skin level as seen below. This tracks 8cm deep. There is 3-4cm undermining in all directions. There is foul smelling, grey drainage from the wound. Periwound is clean with no signs of cellulitis.   Lab Results:  Recent Labs    06/28/20 1753  WBC 9.2  HGB 11.7*  HCT 37.3*  PLT 389   BMET Recent Labs    06/28/20 1753  NA 139  K 4.0  CL 103  CO2 24  GLUCOSE 114*  BUN 9  CREATININE 0.53*  CALCIUM 9.4   PT/INR No results for input(s): LABPROT, INR in the last 72 hours. ABG No results for input(s): PHART, HCO3 in the last 72 hours.  Invalid input(s):  PCO2, PO2  Studies/Results: No results found.  Anti-infectives: Anti-infectives (From admission, onward)   Start     Dose/Rate Route Frequency Ordered Stop   06/18/20 2000  amoxicillin-clavulanate (AUGMENTIN) 875-125 MG per tablet 1 tablet        1 tablet Oral Every 12 hours 06/18/20 1346 06/21/20 0842   06/17/20 2359  piperacillin-tazobactam (ZOSYN) IVPB 3.375 g  Status:  Discontinued        3.375 g 12.5 mL/hr over 240 Minutes Intravenous Every 8 hours 06/16/20 2353 06/17/20 0013   06/17/20 0800  vancomycin (VANCOREADY) IVPB 1250 mg/250 mL  Status:  Discontinued        1,250 mg 166.7 mL/hr over 90 Minutes Intravenous Every 8 hours 06/17/20 0258 06/18/20 1346   06/17/20 0030  vancomycin (VANCOREADY) IVPB 2000 mg/400 mL        2,000 mg 200 mL/hr over 120 Minutes Intravenous  Once 06/16/20 2353 06/17/20 0435   06/17/20 0030  piperacillin-tazobactam (ZOSYN) IVPB 3.375 g  Status:  Discontinued        3.375 g 12.5 mL/hr over 240 Minutes Intravenous Every 8 hours 06/17/20 0013 06/18/20 1346      Assessment/Plan: s/p Procedure(s): DEBRIDEMENT SACRAL WOUND (N/A) -tO OR today for d&I of sacral decub  LOS: 21 days    Collin Brown 06/29/2020

## 2020-06-29 NOTE — Progress Notes (Signed)
Occupational Therapy Session Note  Patient Details  Name: Collin Brown MRN: [id number] Date of Birth: 07/04/1986  Today's Date: 06/29/2020 OT Missed Time: 60 Minutes Missed Time Reason: Other (comment) (surgery)   Pt missed 60 min skilled OT d/t down in surgery for wound debridement. Will follow up as [name] 06/29/2020, 6:50 AM

## 2020-06-29 NOTE — Transfer of Care (Signed)
Immediate Anesthesia Transfer of Care Note  Patient: Collin Brown  Procedure(s) Performed: DEBRIDEMENT SACRAL WOUND (N/A Buttocks)  Patient Location: PACU  Anesthesia Type:General  Level of Consciousness: awake, alert  and oriented  Airway & Oxygen Therapy: Patient Spontanous Breathing and Patient connected to face mask oxygen  Post-op Assessment: Report given to RN and Post -op Vital signs reviewed and stable  Post vital signs: Reviewed and stable  Last Vitals:  Vitals Value Taken Time  BP 108/65 06/29/20 0848  Temp    Pulse 108 06/29/20 0852  Resp 15 06/29/20 0852  SpO2 100 % 06/29/20 0852  Vitals shown include unvalidated device data.  Last Pain:  Vitals:   06/29/20 0523  TempSrc: Oral  PainSc:       Patients Stated Pain Goal: 3 (06/28/20 0521)  Complications: No complications documented.

## 2020-06-29 NOTE — Progress Notes (Signed)
Occupational Therapy Session Note  Patient Details  Name: Collin Brown MRN: [id number] Date of Birth: 06/01/1986  Today's Date: 06/29/2020 OT Individual Time: 1430-1530 OT Individual Time Calculation (min): 60 min    Short Term Goals: Week 3:  OT Short Term Goal 1 (Week 3): STG=LTG 2/2 ELOS  Skilled Therapeutic Interventions/Progress Updates:  Patient met in R side-lying in agreement with OT treatment session. 4/10 pain in sacrum at rest increasing to 6/10 pain with bed level activity. Patient declined OOB activity 2/2 pain related to I&D earlier this date. Re-taping of R shoulder for pain management. Patient education on NMR in LUE. Patient tolerated 1 set x10 reps each of elbow flex/ext, wrist flex/ext, forearm pronation/supination, gross grasp, and abduction/adduction of digits with assist. Patient also completed 2 sets x10 reps each of clamshells on L. Patient declined repositioning in bed to complete exercise on R side. Family present at bedside throughout treatment session. Session concluded with patient in R side-lying with call bell within reach and all needs met.   Therapy Documentation Precautions:  Precautions Precautions: Fall Precaution Comments: flaccid LUE (protect from subluxation); sling when OOB for comfort; skin integrity (sacral wound); monitor vitals Required Braces or Orthoses: Sling Restrictions Weight Bearing Restrictions: No General: General PT Missed Treatment Reason: Pain  Therapy/Group: Individual Therapy   R Howerton-Davis 06/29/2020, 2:38 PM 

## 2020-06-29 NOTE — Anesthesia Postprocedure Evaluation (Signed)
Anesthesia Post Note  Patient: Collin Brown  Procedure(s) Performed: DEBRIDEMENT SACRAL WOUND (N/A Buttocks)     Patient location during evaluation: PACU Anesthesia Type: General Level of consciousness: awake and alert Pain management: pain level controlled Vital Signs Assessment: post-procedure vital signs reviewed and stable Respiratory status: spontaneous breathing, nonlabored ventilation, respiratory function stable and patient connected to nasal cannula oxygen Cardiovascular status: blood pressure returned to baseline and stable Postop Assessment: no apparent nausea or vomiting Anesthetic complications: no   No complications documented.  Last Vitals:  Vitals:   06/29/20 0935 06/29/20 1003  BP:  114/72  Pulse: (!) 107 (!) 105  Resp:  16  Temp: 36.9 C 36.7 C  SpO2: 95%     Last Pain:  Vitals:   06/29/20 1003  TempSrc: Oral  PainSc:                  Deserie Dirks COKER

## 2020-06-29 NOTE — Progress Notes (Signed)
Physical Therapy Session Note  Patient Details  Name: Collin Brown MRN: [id number] Date of Birth: [birth year]  Today's Date: 06/29/2020 PT Individual Time: 6433-2951 PT Individual Time Calculation (min): 8 min   and  Today's Date: 06/29/2020 PT Missed Time: 52 Minutes Missed Time Reason: Pain  Short Term Goals: Week 3:  PT Short Term Goal 1 (Week 3): Patient will perform bed mobility with CGA without use of hospital bed functions. PT Short Term Goal 2 (Week 3): Patient will perform basic transfers with min A consistently. PT Short Term Goal 3 (Week 3): Patient will ambulate 100 ft using LRAD with min A. PT Short Term Goal 4 (Week 3): Patient will initiate stair training.  Skilled Therapeutic Interventions/Progress Updates:    Pt received R semi-sidelying in bed and upon therapist arrival reports he is willing to try bed-level therapies but cannot tolerate sitting EOB at this time. Pt educated on performing LE exercises and initiated movement of LEs with significant increase in pain. Pt unable to tolerate therapy at this time due to I&D surgery this AM with increased pain despite medication administration therefore therapist assisted with therapeutically repositioning in R sidelying with pillows positioned for pressure relief and L UE support. Pt left with needs in reach. Missed 52 minutes of skilled physical therapy.  Therapy Documentation Precautions:  Precautions Precautions: Fall Precaution Comments: flaccid LUE (protect from subluxation); sling when OOB for comfort; skin integrity (sacral wound); monitor vitals Required Braces or Orthoses: Sling Restrictions Weight Bearing Restrictions: No  Therapy/Group: Individual Therapy  Ginny Forth , PT, DPT, CSRS  06/29/2020, 4:59 PM

## 2020-06-29 NOTE — Progress Notes (Signed)
Pt placed on BIPAP and he is tolerating it well at this moment. 3L O2 bled in.

## 2020-06-29 NOTE — Anesthesia Preprocedure Evaluation (Addendum)
Anesthesia Evaluation  Patient identified by MRN, date of birth, ID band Patient awake    Reviewed: Allergy & Precautions, NPO status , Patient's Chart, lab work & pertinent test results  Airway Mallampati: III  TM Distance: >3 FB Neck ROM: Full    Dental  (+) Teeth Intact, Dental Advisory Given   Pulmonary     + decreased breath sounds      Cardiovascular hypertension,  Rhythm:Regular Rate:Normal     Neuro/Psych    GI/Hepatic   Endo/Other  diabetes  Renal/GU      Musculoskeletal   Abdominal (+) + obese,   Peds  Hematology   Anesthesia Other Findings   Reproductive/Obstetrics                            Anesthesia Physical Anesthesia Plan  ASA: III  Anesthesia Plan: MAC   Post-op Pain Management:    Induction: Intravenous  PONV Risk Score and Plan: Ondansetron and Dexamethasone  Airway Management Planned: Oral ETT  Additional Equipment:   Intra-op Plan:   Post-operative Plan: Extubation in OR  Informed Consent: I have reviewed the patients History and Physical, chart, labs and discussed the procedure including the risks, benefits and alternatives for the proposed anesthesia with the patient or authorized representative who has indicated his/her understanding and acceptance.     Dental advisory given  Plan Discussed with: Anesthesiologist and CRNA  Anesthesia Plan Comments:         Anesthesia Quick Evaluation

## 2020-06-29 NOTE — Progress Notes (Signed)
Physical Therapy Note  Patient Details  Name: Collin Brown MRN: [id number] Date of Birth: 1985/12/30 Today's Date: 06/29/2020   Pt unable to tolerate PT this pm due to pain from wound debriedement. Rada Hay, PT  n   Shearon Balo 06/29/2020, 1:17 PM

## 2020-06-29 NOTE — Progress Notes (Signed)
[facility] PHYSICAL MEDICINE & REHABILITATION PROGRESS NOTE   Subjective/Complaints: S/p surgical debridement today. Patient is in increased pain as a result.  Procal normalized Hgb 11.7 and lost a lot of blood during surgery- surgery will determine if aspirin and lovenox should be resumed on Saturday.   ROS:  Pt denies SOB, abd pain, CP, N/V/C/D, and vision changes    No results found. Recent Labs    06/28/20 1753  WBC 9.2  HGB 11.7*  HCT 37.3*  PLT 389   Recent Labs    06/28/20 1753  NA 139  K 4.0  CL 103  CO2 24  GLUCOSE 114*  BUN 9  CREATININE 0.53*  CALCIUM 9.4    Intake/Output Summary (Last 24 hours) at 06/29/2020 1154 Last data filed at 06/29/2020 0935 Gross per 24 hour  Intake 1390 ml  Output 875 ml  Net 515 ml     Pressure Injury 06/08/20 Coccyx Unstageable - Full thickness tissue loss in which the base of the injury is covered by slough (yellow, tan, gray, green or brown) and/or eschar (tan, brown or black) in the wound bed. yellow necrotic tissue in the crease o (Active)  06/08/20 1830 (with Lizabeth Leyden RN)  Location: Coccyx  Location Orientation:   Staging: Unstageable - Full thickness tissue loss in which the base of the injury is covered by slough (yellow, tan, gray, green or brown) and/or eschar (tan, brown or black) in the wound bed.  Wound Description (Comments): yellow necrotic tissue in the crease originally put in as a nonpressure wound  Present on Admission: Yes    Physical Exam: Vital Signs Blood pressure 114/72, pulse (!) 105, temperature 98 F (36.7 C), temperature source Oral, resp. rate 16, height 5\' 10"  (1.778 m), weight (!) 156.9 kg, SpO2 95 %.  General: Alert and oriented x 3, No apparent distress HEENT: Head is normocephalic, atraumatic, PERRLA, EOMI, sclera anicteric, oral mucosa pink and moist, dentition intact, ext ear canals clear,  Neck: Supple without JVD or lymphadenopathy Heart: Reg rate and rhythm. No murmurs rubs  or gallops Chest: CTA bilaterally without wheezes, rales, or rhonchi; no distress Abdomen: Soft, non-tender, non-distended, bowel sounds positive. Extremities: No clubbing, cyanosis, or edema. Pulses are 2+ Skin: eschar on toes, improving.  Sacral wound with dressing C/D/I after surgical debridement this morning Neuro: AOx3 Cranial nerves 2-12 are intact. DTR's 1+.  Fine motor coordination is intact. No tremors. Motor function is grossly 5/5 RUE. LUE with ABD/EE,WE weakness ongoing  Musculoskeletal: Full ROM, No pain with AROM or PROM in the neck, trunk, or LE's.  Posture appropriate. Wearing LUE sling   Assessment/Plan: 1. 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  Physiatrist is providing close team supervision and 24 hour management of active medical problems listed below.  Physiatrist and rehab team continue to assess barriers to discharge/monitor patient progress toward functional and medical goals  Care Tool:  Bathing    Body parts bathed by patient: Left arm, Chest, Abdomen, Right upper leg, Left upper leg   Body parts bathed by helper: Right arm     Bathing assist Assist Level: Minimal Assistance - Patient > 75%     Upper Body Dressing/Undressing Upper body dressing   What is the patient wearing?: Pull over shirt    Upper body assist Assist Level: Minimal Assistance - Patient > 75%    Lower Body Dressing/Undressing Lower body dressing  What is the patient wearing?: Pants     Lower body assist Assist for lower body dressing: Maximal Assistance - Patient 25 - 49%     Toileting Toileting    Toileting assist Assist for toileting: Moderate Assistance - Patient 50 - 74%     Transfers Chair/bed transfer  Transfers assist  Chair/bed transfer activity did not occur: Safety/medical concerns  Chair/bed transfer assist level: Minimal  Assistance - Patient > 75% Chair/bed transfer assistive device: Walker (L PFRW)   Locomotion Ambulation   Ambulation assist   Ambulation activity did not occur: Safety/medical concerns  Assist level: 2 helpers (minA of 1 and +2 w/c follow) Assistive device: Walker-platform Max distance: 55ft   Walk 10 feet activity   Assist  Walk 10 feet activity did not occur: Safety/medical concerns  Assist level: 2 helpers (minA of 1 and +2 w/c follow) Assistive device: Walker-platform   Walk 50 feet activity   Assist Walk 50 feet with 2 turns activity did not occur: Safety/medical concerns (did not perform turns)  Assist level: 2 helpers (minA of 1 and +2 w/c follow) Assistive device: Walker-platform    Walk 150 feet activity   Assist Walk 150 feet activity did not occur: Safety/medical concerns  Assist level: 2 helpers      Walk 10 feet on uneven surface  activity   Assist Walk 10 feet on uneven surfaces activity did not occur: Safety/medical concerns         Wheelchair     Assist Will patient use wheelchair at discharge?: No   Wheelchair activity did not occur: Safety/medical concerns         Wheelchair 50 feet with 2 turns activity    Assist    Wheelchair 50 feet with 2 turns activity did not occur: Safety/medical concerns       Wheelchair 150 feet activity     Assist  Wheelchair 150 feet activity did not occur: Safety/medical concerns       Blood pressure 114/72, pulse (!) 105, temperature 98 F (36.7 C), temperature source Oral, resp. rate 16, height 5\' 10"  (1.778 m), weight (!) 156.9 kg, SpO2 95 %.  Medical Problem List and Plan: 1.  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             -continue therapies as tolerated. Trying to work through  pain related to wounds.  2.  Antithrombotics: -DVT/anticoagulation: Lovenox 85 mg daily to be resumed as per surgery recommendations on Saturday.              -antiplatelet therapy: Aspirin 81 mg daily 3. Pain Management: Advil as needed             Oxycodone as needed             11/5: pain improved with 400mg  tid of gabapentin  11/6-9: stable  11/12: continue gabapentin, kpad   -dilaudid for more severe pain   -increased dilaudid to 4mg  30 min prior to wound care  11/13- pt reports most of pain is sacral wound- and wound care- doing OK otherwise- con't regimen  11/14- says air mattress helps sacral pain some.   11/15-16: pain continues to be severe around hydrotherapy but patient prefers  to continue with it given its benefits for wound healing. Continue dilaudid and oxycodone prn  11/17: Add oxycontin 10mg  q12H and decrease frequency of oxycodone to q6H.   11/19: pain more severe after surgical debridement today. May be limited in therapies due to pain   4. Mood: Provide emotional support             -antipsychotic agents: N/A 5. Neuropsych: This patient is capable of making decisions on his own behalf. 6. 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 may ultimately need surgery to help expose the wound further.  Appreciate WOC nurse f/u. Having a hard time sitting due to pain from pressure injury. Appreciate surgery debridement 11/19 AM.  7. Fluids/Electrolytes/Nutrition: encourage po 8.  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  Patient completed 5-day course Solu-Medrol             Supportive care with range of motion             Recommend NCV/EMG as outpatient.  -increased gabapentin as above 9.  New onset diabetes mellitus, hemoglobin A1c 7.2.  NovoLog 4 units 3 times daily, Levemir 15 units twice daily.               CBG (last 3)  Recent Labs    06/28/20 1711  06/28/20 1957 06/29/20 0542  GLUCAP 109* 146* 110*   11/15-19: mildly elevated.  10.  Hypertension with tachycardia.  Lopressor 50 mg twice daily.               10/31 pt hypotensive yesterday with OT which wasn't unexpected   -TEDS, Abd binder   -push po fluids   -continue to acclimate, get OOB   -reassured him that this is to be expected   -continue to monitor  11/3: Increased Lopressor to 75mg  BID  11/12 bp  controlled today, tachy not helped by pain 11.  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  Dietary follow-up  11/14- BMI down to 49.63 12. Fever, UCX with 100 Klebsiella  -stool sample also positive for salmonella  -chest xray and exam unremarkable  -blood cultures pending but negative so far  -abx narrowed to augmentin which completed 11/11  -encourage PO  -recheck cbc Monday  13. Hypokalemia: K+ 3.1 on 11/10: supplement Kdur Saturday BID and repeat tomorrow.   -11/11 k+ 3.4   -continue supplementation   11/14- BMP on Monday 14. Constipation  11/14- LBM 4 days ago- says cannot go on [facility], toilet or bedpan due to being so hard- asked for padded BSC.  Will check with nursing to find one, if possible. If not, maybe PT/OT has an option        LOS: 21 days A FACE TO FACE EVALUATION WAS PERFORMED  Emmily Pellegrin P Tison Leibold 06/29/2020, 11:54 AM

## 2020-06-30 ENCOUNTER — Encounter (HOSPITAL_COMMUNITY): Payer: Self-pay | Admitting: General Surgery

## 2020-06-30 LAB — GLUCOSE, CAPILLARY
Glucose-Capillary: 126 mg/dL — ABNORMAL HIGH (ref 70–99)
Glucose-Capillary: 101 mg/dL — ABNORMAL HIGH (ref 70–99)
Glucose-Capillary: 108 mg/dL — ABNORMAL HIGH (ref 70–99)
Glucose-Capillary: 149 mg/dL — ABNORMAL HIGH (ref 70–99)

## 2020-06-30 LAB — PROCALCITONIN: Procalcitonin: 0.22 ng/mL

## 2020-06-30 MED ORDER — SENNOSIDES-DOCUSATE SODIUM 8.6-50 MG PO TABS
2.0000 | ORAL_TABLET | Freq: Two times a day (BID) | ORAL | Status: DC
  Administered 2020-06-30 – 2020-07-13 (×24): 2 via ORAL
  Filled 2020-06-30 (×26): qty 2

## 2020-06-30 MED ORDER — POLYETHYLENE GLYCOL 3350 17 G PO PACK: 17.0000 g | PACK | Freq: Every day | ORAL | Status: DC | PRN

## 2020-06-30 MED ORDER — ENOXAPARIN SODIUM 80 MG/0.8ML ~~LOC~~ SOLN
80.0000 mg | SUBCUTANEOUS | Status: DC
  Administered 2020-06-30 – 2020-07-12 (×13): 80 mg via SUBCUTANEOUS
  Filled 2020-06-30 (×14): qty 0.8

## 2020-06-30 MED ORDER — ASPIRIN 81 MG PO CHEW
81.0000 mg | CHEWABLE_TABLET | Freq: Every day | ORAL | Status: DC
  Administered 2020-06-30 – 2020-07-13 (×14): 81 mg via ORAL
  Filled 2020-06-30 (×14): qty 1

## 2020-06-30 NOTE — Progress Notes (Signed)
Physical Therapy Wound Treatment Patient Details  Name: Collin Brown MRN: [id number] Date of Birth: [birth year]  Today's Date: 06/30/2020 PT Individual Time:  -      Subjective  Subjective: Pt reports he may get a wound VAC next week.  Patient and Family Stated Goals: None stated at this time.  Date of Onset:  (Unknown) Prior Treatments: dressing changes  Pain Score: Faces pain score: 4/10 Wound Assessment  Pressure Injury 06/08/20 Coccyx Unstageable - Full thickness tissue loss in which the base of the injury is covered by slough (yellow, tan, gray, green or brown) and/or eschar (tan, brown or black) in the wound bed. yellow necrotic tissue in the crease o (Active)  Wound Image   06/30/20 1345  Dressing Type ABD;Barrier Film (skin prep);Gauze (Comment);Moist to dry 06/30/20 1345  Dressing Changed;Clean;Dry;Intact 06/30/20 1345  Dressing Change Frequency Daily 06/30/20 1345  State of Healing Non-healing 06/30/20 1345  Site / Wound Assessment Pink;Yellow 06/30/20 1345  % Wound base Red or Granulating 50% 06/30/20 1345  % Wound base Yellow/Fibrinous Exudate 50% 06/30/20 1345  % Wound base Black/Eschar 0% 06/30/20 1345  % Wound base Other/Granulation Tissue (Comment) 0% 06/30/20 1345  Peri-wound Assessment Intact 06/30/20 1345  Wound Length (cm) 3.4 cm 06/30/20 1000  Wound Width (cm) 3.5 cm 06/30/20 1000  Wound Depth (cm) 7.8 cm 06/30/20 1000  Wound Surface Area (cm^2) 11.9 cm^2 06/30/20 1000  Wound Volume (cm^3) 92.82 cm^3 06/30/20 1000  Tunneling (cm) 3 at 5:00; 3.3 at 12:00 06/30/20 1000  Undermining (cm) 4 cm at 12:00; 4.5 cm at 3:00 06/25/20 1026  Margins Unattached edges (unapproximated) 06/30/20 1345  Drainage Amount Moderate 06/30/20 1345  Drainage Description Odor;Purulent 06/30/20 1345  Treatment Debridement (Selective);Hydrotherapy (Pulse lavage);Packing 06/30/20 1345   Dakin's Solution applied to gauze prior to packing the wound.     Hydrotherapy Pulsed lavage  therapy - wound location: sacrum Pulsed Lavage with Suction (psi): 12 psi Pulsed Lavage with Suction - Normal Saline Used: 1000 mL Pulsed Lavage Tip: Tip with splash shield Selective Debridement Selective Debridement - Location: sacrum Selective Debridement - Tools Used: Forceps;Scissors Selective Debridement - Tissue Removed: yellow necrotic tissue   Wound Assessment and Plan  Wound Therapy - Assess/Plan/Recommendations Wound Therapy - Clinical Statement: Wound reassessed after surgical intervention yesterday. Noted about half of the visualized tissue was pink and the rest was yellow slough which was mostly adherent to the wall of the wound. This patient will benefit from continued hydrotherapy for selective removal of unviable tissue, to decrease bioburden, and promote wound bed healing.  Wound Therapy - Functional Problem List: decreased mobility Factors Delaying/Impairing Wound Healing: Immobility Hydrotherapy Plan: Debridement;Patient/family education;Pulsatile lavage with suction;Dressing change Wound Therapy - Frequency: 6X / week Wound Therapy - Follow Up Recommendations: Home health RN Wound Plan: see above  Wound Therapy Goals- Improve the function of patient's integumentary system by progressing the wound(s) through the phases of wound healing (inflammation - proliferation - remodeling) by: Decrease Necrotic Tissue to: 20 Decrease Necrotic Tissue - Progress: Progressing toward goal Increase Granulation Tissue to: 80 Increase Granulation Tissue - Progress: Progressing toward goal Goals/treatment plan/discharge plan were made with and agreed upon by patient/family: Yes Time For Goal Achievement: 7 days Wound Therapy - Potential for Goals: Good  Goals will be updated until maximal potential achieved or discharge criteria met.  Discharge criteria: when goals achieved, discharge from hospital, MD decision/surgical intervention, no progress towards goals, refusal/missing three  consecutive treatments without notification or medical reason.  GP  [name] 06/30/2020, 1:54 PM  [name], PT, DPT Acute Rehabilitation Services Pager: [phone] Office: [phone]

## 2020-06-30 NOTE — Progress Notes (Signed)
PHYSICAL MEDICINE & REHABILITATION PROGRESS NOTE   Subjective/Complaints:  No issues overnight , slept ok, per pt Gen Surg PA changed dressing this am , pt reports blood draw this am   ROS:  Pt denies SOB, abd pain, CP, N/V/C/D, and vision changes    No results found. Recent Labs    06/28/20 1753  WBC 9.2  HGB 11.7*  HCT 37.3*  PLT 389   Recent Labs    06/28/20 1753  NA 139  K 4.0  CL 103  CO2 24  GLUCOSE 114*  BUN 9  CREATININE 0.53*  CALCIUM 9.4    Intake/Output Summary (Last 24 hours) at 06/30/2020 2353 Last data filed at 06/30/2020 0913 Gross per 24 hour  Intake 1144 ml  Output 1275 ml  Net -131 ml     Pressure Injury 06/08/20 Coccyx Unstageable - Full thickness tissue loss in which the base of the injury is covered by slough (yellow, tan, gray, green or brown) and/or eschar (tan, brown or black) in the wound bed. yellow necrotic tissue in the crease o (Active)  06/08/20 1830 (with Lizabeth Leyden RN)  Location: Coccyx  Location Orientation:   Staging: Unstageable - Full thickness tissue loss in which the base of the injury is covered by slough (yellow, tan, gray, green or brown) and/or eschar (tan, brown or black) in the wound bed.  Wound Description (Comments): yellow necrotic tissue in the crease originally put in as a nonpressure wound  Present on Admission: Yes    Physical Exam: Vital Signs Blood pressure 118/77, pulse 99, temperature 98.7 F (37.1 C), resp. rate 18, height 5\' 10"  (1.778 m), weight (!) 156.9 kg, SpO2 98 %.   General: No acute distress Mood and affect are appropriate Heart: Regular rate and rhythm no rubs murmurs or extra sounds Lungs: Clear to auscultation, breathing unlabored, no rales or wheezes Abdomen: Positive bowel sounds, soft nontender to palpation, nondistended Extremities: No clubbing, cyanosis, or edema Skin: No evidence of breakdown, no evidence of rash   Neuro: AOx3 Cranial nerves 2-12 are intact.  DTR's 1+.  Fine motor coordination is intact. No tremors. Motor function is grossly 5/5 RUE. LUE with ABD/EE,WE weakness ongoing  Musculoskeletal: Full ROM, No pain with AROM or PROM in the neck, trunk, or LE's.  Posture appropriate. Wearing LUE sling   Assessment/Plan: 1. 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  Physiatrist is providing close team supervision and 24 hour management of active medical problems listed below.  Physiatrist and rehab team continue to assess barriers to discharge/monitor patient progress toward functional and medical goals  Care Tool:  Bathing    Body parts bathed by patient: Left arm, Chest, Abdomen, Right upper leg, Left upper leg   Body parts bathed by helper: Right arm     Bathing assist Assist Level: Minimal Assistance - Patient > 75%     Upper Body Dressing/Undressing Upper body dressing   What is the patient wearing?: Pull over shirt    Upper body assist Assist Level: Minimal Assistance - Patient > 75%    Lower Body Dressing/Undressing Lower body dressing      What is the patient wearing?: Pants     Lower body assist Assist for lower body dressing: Maximal Assistance - Patient 25 - 49%     Toileting Toileting    Toileting assist Assist for toileting: Moderate Assistance - Patient 50 -  74%     Transfers Chair/bed transfer  Transfers assist  Chair/bed transfer activity did not occur: Safety/medical concerns  Chair/bed transfer assist level: Minimal Assistance - Patient > 75% Chair/bed transfer assistive device: Walker (L PFRW)   Locomotion Ambulation   Ambulation assist   Ambulation activity did not occur: Safety/medical concerns  Assist level: 2 helpers (minA of 1 and +2 w/c follow) Assistive device: Walker-platform Max distance: 88ft   Walk 10 feet activity   Assist  Walk 10 feet activity did  not occur: Safety/medical concerns  Assist level: 2 helpers (minA of 1 and +2 w/c follow) Assistive device: Walker-platform   Walk 50 feet activity   Assist Walk 50 feet with 2 turns activity did not occur: Safety/medical concerns (did not perform turns)  Assist level: 2 helpers (minA of 1 and +2 w/c follow) Assistive device: Walker-platform    Walk 150 feet activity   Assist Walk 150 feet activity did not occur: Safety/medical concerns  Assist level: 2 helpers      Walk 10 feet on uneven surface  activity   Assist Walk 10 feet on uneven surfaces activity did not occur: Safety/medical concerns         Wheelchair     Assist Will patient use wheelchair at discharge?: No   Wheelchair activity did not occur: Safety/medical concerns         Wheelchair 50 feet with 2 turns activity    Assist    Wheelchair 50 feet with 2 turns activity did not occur: Safety/medical concerns       Wheelchair 150 feet activity     Assist  Wheelchair 150 feet activity did not occur: Safety/medical concerns       Blood pressure 118/77, pulse 99, temperature 98.7 F (37.1 C), resp. rate 18, height 5\' 10"  (1.778 m), weight (!) 156.9 kg, SpO2 98 %.  Medical Problem List and Plan: 1.  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             -continue therapies as tolerated. Trying to work through pain related to wounds.  2.  Antithrombotics: -DVT/anticoagulation: Lovenox 80 mg daily ordered by surgery recommendations on Saturday.              -antiplatelet therapy: Aspirin 81 mg daily 3. Pain Management: Advil as needed             Oxycodone as needed             11/5: pain improved with 400mg  tid of gabapentin  11/6-9: stable  11/12: continue gabapentin, kpad   -dilaudid for more  severe pain   -increased dilaudid to 4mg  30 min prior to wound care  11/13- pt reports most of pain is sacral wound- and wound care- doing OK otherwise- con't regimen  11/14- says air mattress helps sacral pain some.   11/15-16: pain continues to be severe around hydrotherapy but patient prefers to continue with it given its benefits for wound healing. Continue dilaudid and oxycodone prn  11/17: Add oxycontin 10mg  q12H and decrease frequency of oxycodone to q6H.   11/19: pain more severe after surgical debridement today. May be limited in therapies due to pain   4. Mood: Provide emotional  support             -antipsychotic agents: N/A 5. Neuropsych: This patient is capable of making decisions on his own behalf. 6. 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  7. Fluids/Electrolytes/Nutrition: encourage po 8.  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  Patient completed 5-day course Solu-Medrol             Supportive care with range of motion             Recommend NCV/EMG as outpatient.  -increased gabapentin as above 9.  New onset diabetes mellitus, hemoglobin A1c 7.2.  NovoLog 4 units 3 times daily, Levemir 15 units twice daily.               CBG (last 3)  Recent Labs    06/29/20 1652 06/29/20 2111 06/30/20 0618  GLUCAP 107* 111* 126*   Controlled  10.  Hypertension with tachycardia.  Lopressor 50 mg twice daily.               10/31 pt hypotensive yesterday with OT which wasn't unexpected   -TEDS, Abd binder   -push po fluids   -continue to acclimate, get OOB   -reassured him that this is to be expected   -continue to monitor  11/3: Increased Lopressor to 75mg  BID  11/12 bp  controlled today, tachy not helped by pain 11.  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  Dietary follow-up  11/14- BMI down to 49.63 12. Fever, UCX with 100 Klebsiella  -stool sample also positive for  salmonella  -chest xray and exam unremarkable  -blood cultures pending but negative so far  -abx narrowed to augmentin which completed 11/11  -encourage PO  -recheck cbc Monday  13. Hypokalemia: K+ 3.1 on 11/10: supplement Kdur 13/10 BID and repeat tomorrow.   -11/11 k+ 3.4   -continue supplementation   11/14- BMP on Monday 14. Constipation  11/14- LBM 4 days ago- says cannot go on [facility], toilet or bedpan due to being so hard- asked for padded BSC.  Will check with nursing to find one, if possible. If not, maybe PT/OT has an option  15.  ABLA will recheck CBC in am       LOS: 22 days A FACE TO FACE EVALUATION WAS PERFORMED  [facility] 06/30/2020, 9:16 AM

## 2020-06-30 NOTE — Progress Notes (Signed)
Occupational Therapy Session Note  Patient Details  Name: Collin Brown MRN: [id number] Date of Birth: [birth year]  Today's Date: 06/30/2020 OT Individual Time: 4469-5072 OT Individual Time Calculation (min): 27 min make up time   Short Term Goals: Week 1:  OT Short Term Goal 1 (Week 1): Pt will complete sit<>stand with mod assist +2 in preperation for LB ADLs OT Short Term Goal 1 - Progress (Week 1): Met OT Short Term Goal 2 (Week 1): Pt will achieve full PROM elbow flexion with minimal to no pain. OT Short Term Goal 2 - Progress (Week 1): Progressing toward goal OT Short Term Goal 3 (Week 1): Pt will complete UB dressing with mod assist. OT Short Term Goal 3 - Progress (Week 1): Met OT Short Term Goal 4 (Week 1): Pt will complete UB bathing using long handled sponge as needed with min assist. OT Short Term Goal 4 - Progress (Week 1): Met  Skilled Therapeutic Interventions/Progress Updates:    1:1. Pt received in bed agreeable to OT make up session. Pt with question about being able to read with 1 hand. OT pulls up one handed bood holder on google and talks through pros and cons of a few options. Pt appreciative. Pt completes towel glides in sidelying gravity eliminated with tray table to support arm and OT providing tactile cues to decrease shoulder compensations and move scapuala. Pt require increased A for elbow flex/ext and islated shouder flex/ext. Exited session with pt seated in bed, exit alarm on and call light in reach   Therapy Documentation Precautions:  Precautions Precautions: Fall Precaution Comments: flaccid LUE (protect from subluxation); sling when OOB for comfort; skin integrity (sacral wound); monitor vitals Required Braces or Orthoses: Sling Restrictions Weight Bearing Restrictions: No General:   Vital Signs: Therapy Vitals BP: 118/77 Pain: Pain Assessment Pain Scale: 0-10 Pain Score: 8  Pain Type: Acute pain Pain Location: Buttocks Pain Descriptors /  Indicators: Sore Pain Onset: On-going Pain Intervention(s): Medication (See eMAR) ADL: ADL Upper Body Bathing: Maximal assistance Where Assessed-Upper Body Bathing: Edge of bed Lower Body Bathing: Other (comment) (+2 assist) Where Assessed-Lower Body Bathing: Edge of bed, Bed level (attempted to wash buttocks but quickly fatigued; therefore completed at bed level sidelying) Upper Body Dressing: Maximal assistance Where Assessed-Upper Body Dressing: Edge of bed Lower Body Dressing: Other (Comment) (attempted in standing but fatigued; bed level +2 total assist) Where Assessed-Lower Body Dressing: Edge of bed, Bed level Toileting: Dependent Where Assessed-Toileting: Bed level (urinal and incontinent of bowel) Vision   Perception    Praxis   Exercises:   Other Treatments:     Therapy/Group: Individual Therapy  Tonny Branch 06/30/2020, 9:58 AM

## 2020-06-30 NOTE — Progress Notes (Signed)
[facility] Progress Note  1 Day Post-Op  Subjective: CC-  Surgical site sore today but pain well controlled. States that hydrotherapy should be coming soon.  Objective: Vital signs in last 24 hours: Temp:  [98 F (36.7 C)-98.7 F (37.1 C)] 98.7 F (37.1 C) (11/20 0443) Pulse Rate:  [99-110] 99 (11/20 0443) Resp:  [10-19] 18 (11/19 2241) BP: (90-124)/(54-77) 118/77 (11/20 0732) SpO2:  [88 %-98 %] 98 % (11/20 0443) Last BM Date: 06/27/20  Intake/Output from previous day: 11/19 0701 - 11/20 0700 In: 1540 [P.O.:690; I.V.:700; IV Piggyback:50] Out: 1300 [Urine:1275; Blood:25] Intake/Output this shift: No intake/output data recorded.  PE: Gen:  Alert, NAD, pleasant Pulm:  rate and effort normal Abd: Soft, NT/ND Skin: warm and dry Sacral wound: ~3.5x4cm opening, about 8cm deep, some persistent fibrinous exudate at base, foul odor, periwound clean without induration or cellulitis     Lab Results:  Recent Labs    06/28/20 1753  WBC 9.2  HGB 11.7*  HCT 37.3*  PLT 389   BMET Recent Labs    06/28/20 1753  NA 139  K 4.0  CL 103  CO2 24  GLUCOSE 114*  BUN 9  CREATININE 0.53*  CALCIUM 9.4   PT/INR No results for input(s): LABPROT, INR in the last 72 hours. CMP     Component Value Date/Time   NA 139 06/28/2020 1753   K 4.0 06/28/2020 1753   CL 103 06/28/2020 1753   CO2 24 06/28/2020 1753   GLUCOSE 114 (H) 06/28/2020 1753   BUN 9 06/28/2020 1753   CREATININE 0.53 (L) 06/28/2020 1753   CALCIUM 9.4 06/28/2020 1753   PROT 6.0 (L) 06/28/2020 1753   ALBUMIN 2.9 (L) 06/28/2020 1753   AST 17 06/28/2020 1753   ALT 24 06/28/2020 1753   ALKPHOS 54 06/28/2020 1753   BILITOT 0.4 06/28/2020 1753   GFRNONAA >60 06/28/2020 1753   GFRAA >60 05/15/2020 0415   Lipase  No results found for: LIPASE     Studies/Results: No results found.  Anti-infectives: Anti-infectives (From admission, onward)   Start     Dose/Rate Route Frequency Ordered Stop    06/18/20 2000  amoxicillin-clavulanate (AUGMENTIN) 875-125 MG per tablet 1 tablet        1 tablet Oral Every 12 hours 06/18/20 1346 06/21/20 0842   06/17/20 2359  piperacillin-tazobactam (ZOSYN) IVPB 3.375 g  Status:  Discontinued        3.375 g 12.5 mL/hr over 240 Minutes Intravenous Every 8 hours 06/16/20 2353 06/17/20 0013   06/17/20 0800  vancomycin (VANCOREADY) IVPB 1250 mg/250 mL  Status:  Discontinued        1,250 mg 166.7 mL/hr over 90 Minutes Intravenous Every 8 hours 06/17/20 0258 06/18/20 1346   06/17/20 0030  vancomycin (VANCOREADY) IVPB 2000 mg/400 mL        2,000 mg 200 mL/hr over 120 Minutes Intravenous  Once 06/16/20 2353 06/17/20 0435   06/17/20 0030  piperacillin-tazobactam (ZOSYN) IVPB 3.375 g  Status:  Discontinued        3.375 g 12.5 mL/hr over 240 Minutes Intravenous Every 8 hours 06/17/20 0013 06/18/20 1346       Assessment/Plan Dm2 HTN Hx of COVID 9/23  Sacral Wound S/p DEBRIDEMENT SACRAL WOUND 11/19 Dr. Derrell Lolling - POD#1 - Continue hydrotherapy and BID wet to dry dressing changes today.  Patient states that he is supposed to be set for discharge home with his mom next Friday. States that she is coming to visit  on Monday, recommend coordinating with her next week to show her how to do dressing changes. Home health also ordered for RN assistance with wound management.   ID - currently none FEN - CM diet VTE - restart ASA and lovenox today Foley - none Follow up - wound clinic?    LOS: 22 days    Franne Forts, [facility] 06/30/2020, 9:10 AM Please see Amion for pager number during day hours 7:00am-4:30pm

## 2020-06-30 NOTE — Plan of Care (Signed)
  Problem: Consults Goal: RH GENERAL PATIENT EDUCATION Description: See Patient Education module for education specifics. Outcome: Progressing Goal: Skin Care Protocol Initiated - if Braden Score 18 or less Description: If consults are not indicated, leave blank or document N/A Outcome: Progressing Goal: Nutrition Consult-if indicated Outcome: Progressing Goal: Diabetes Guidelines if Diabetic/Glucose > 140 Description: If diabetic or lab glucose is > 140 mg/dl - Initiate Diabetes/Hyperglycemia Guidelines & Document Interventions  Outcome: Progressing   Problem: RH BOWEL ELIMINATION Goal: RH STG MANAGE BOWEL WITH ASSISTANCE Description: STG Manage Bowel with  mod Assistance. Outcome: Progressing Goal: RH STG MANAGE BOWEL W/MEDICATION W/ASSISTANCE Description: STG Manage Bowel with Medication with supervision Assistance. Outcome: Progressing   Problem: RH SKIN INTEGRITY Goal: RH STG SKIN FREE OF INFECTION/BREAKDOWN Description: Skin to remain free from additional breakdown while on rehab with supervision assistance. Outcome: Progressing Goal: RH STG MAINTAIN SKIN INTEGRITY WITH ASSISTANCE Description: STG Maintain Skin Integrity With  mod I Assistance. Outcome: Progressing Goal: RH STG ABLE TO PERFORM INCISION/WOUND CARE W/ASSISTANCE Description: STG Able To Perform Incision/Wound Care With mod I Assistance. Outcome: Progressing   Problem: RH SAFETY Goal: RH STG ADHERE TO SAFETY PRECAUTIONS W/ASSISTANCE/DEVICE Description: STG Adhere to Safety Precautions With  mod I Assistance/Device. Outcome: Progressing   Problem: RH PAIN MANAGEMENT Goal: RH STG PAIN MANAGED AT OR BELOW PT'S PAIN GOAL Description: Pain free and <3  Outcome: Progressing   

## 2020-06-30 NOTE — Progress Notes (Signed)
Placed patient on BIPAP for the night.  

## 2020-07-01 LAB — CBC
HCT: 36.5 % — ABNORMAL LOW (ref 39.0–52.0)
Hemoglobin: 11.5 g/dL — ABNORMAL LOW (ref 13.0–17.0)
MCH: 27.6 pg (ref 26.0–34.0)
MCHC: 31.5 g/dL (ref 30.0–36.0)
MCV: 87.5 fL (ref 80.0–100.0)
Platelets: 385 10*3/uL (ref 150–400)
RBC: 4.17 MIL/uL — ABNORMAL LOW (ref 4.22–5.81)
RDW: 16.3 % — ABNORMAL HIGH (ref 11.5–15.5)
WBC: 8.3 10*3/uL (ref 4.0–10.5)
nRBC: 0 % (ref 0.0–0.2)

## 2020-07-01 LAB — GLUCOSE, CAPILLARY
Glucose-Capillary: 113 mg/dL — ABNORMAL HIGH (ref 70–99)
Glucose-Capillary: 116 mg/dL — ABNORMAL HIGH (ref 70–99)
Glucose-Capillary: 196 mg/dL — ABNORMAL HIGH (ref 70–99)
Glucose-Capillary: 195 mg/dL — ABNORMAL HIGH (ref 70–99)

## 2020-07-01 MED ORDER — POLYETHYLENE GLYCOL 3350 17 G PO PACK
17.0000 g | PACK | Freq: Every day | ORAL | Status: DC
  Administered 2020-07-01 – 2020-07-08 (×8): 17 g via ORAL
  Filled 2020-07-01 (×8): qty 1

## 2020-07-01 MED ORDER — FLEET ENEMA 7-19 GM/118ML RE ENEM
1.0000 | ENEMA | Freq: Once | RECTAL | Status: AC
  Administered 2020-07-01: 1 via RECTAL
  Filled 2020-07-01: qty 1

## 2020-07-01 NOTE — Progress Notes (Signed)
Physical Therapy Session Note  Patient Details  Name: Collin Brown MRN: [id number] Date of Birth: 1986/08/02  Today's Date: 07/01/2020 PT Individual Time: 4098-1191 PT Individual Time Calculation (min): 15 min   Short Term Goals: Week 3:  PT Short Term Goal 1 (Week 3): Patient will perform bed mobility with CGA without use of hospital bed functions. PT Short Term Goal 2 (Week 3): Patient will perform basic transfers with min A consistently. PT Short Term Goal 3 (Week 3): Patient will ambulate 100 ft using LRAD with min A. PT Short Term Goal 4 (Week 3): Patient will initiate stair training.  Skilled Therapeutic Interventions/Progress Updates:     Patient in bed on his back upon PT arrival to make up missed minutes from last week due to surgery. Patient alert and agreeable to PT session. Patient reported 7-8/10 sacral pain during session, RN made aware. PT provided repositioning, rest breaks, and distraction as pain interventions throughout session. Educated on positioning in side-lying to reduce pain/pressure at his wound site. Patient declined mobility due to not having pain medicine this morning. Repositioned patient in R side-lying with min A. Placed pillows behind patient, between his legs, and under his L arm.   Discussed d/c this week. Educated on patient's therapy goals and will discuss d/c during team conference this week. Informed him that he will need to be medically stable and meet his therapy goals for safe home mobility prior to d/c. Patient stated understanding. Will attempt to make up additional time with patient to perform mobility training once patient is medicated as able.   Patient in bed at end of session with breaks locked, bed alarm set, and all needs within reach.    Therapy Documentation Precautions:  Precautions Precautions: Fall Precaution Comments: flaccid LUE (protect from subluxation); sling when OOB for comfort; skin integrity (sacral wound); monitor  vitals Required Braces or Orthoses: Sling Restrictions Weight Bearing Restrictions: No   Therapy/Group: Individual Therapy  Ames Hoban L Margene Cherian PT, DPT  07/01/2020, 1:07 PM
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
## 2020-07-01 NOTE — Progress Notes (Signed)
Occupational Therapy Session Note  Patient Details  Name: Collin Brown MRN: [id number] Date of Birth: 12-May-1986  Today's Date: 07/01/2020 OT Individual Time: 1325-1420 OT Individual Time Calculation (min): 55 min    Short Term Goals: Week 3:  OT Short Term Goal 1 (Week 3): STG=LTG 2/2 ELOS  Skilled Therapeutic Interventions/Progress Updates:    Pt received supine with 9/10 pain in his sacral wound. Pt agreeable to makeup session. Pt pleased about being able to tolerate laying more supine. Heavily encouraged/educated pt to remain in sidelying for maximal wound healing. Pt completed sidelying to sitting EOB with max A. Discussed need to have BM with pt and RN and encouraged pt to attempt [facility] transfer. Pt sat EOB for several minutes, attempted to stand x2 without success and reported the pain was too high to continue. He returned to supine with min A. UB bathing completed with mod A, special care taken to B axilla areas with some skin breakdown present. Powder applied. Pt donned new gown with mod A. Pt attempted to try and lay on L side with encouragement but halfway through bed mobility reported his pain was too high to continue and he was assisted back to R sidelying. RN was notified of pain medicine request. Pt was left supine with all needs met.   Therapy Documentation Precautions:  Precautions Precautions: Fall Precaution Comments: flaccid LUE (protect from subluxation); sling when OOB for comfort; skin integrity (sacral wound); monitor vitals Required Braces or Orthoses: Sling Restrictions Weight Bearing Restrictions: No   Therapy/Group: Individual Therapy  Curtis Sites 07/01/2020, 2:25 PM

## 2020-07-01 NOTE — Plan of Care (Signed)
  Problem: Consults Goal: RH GENERAL PATIENT EDUCATION Description: See Patient Education module for education specifics. Outcome: Progressing Goal: Skin Care Protocol Initiated - if Braden Score 18 or less Description: If consults are not indicated, leave blank or document N/A Outcome: Progressing Goal: Nutrition Consult-if indicated Outcome: Progressing Goal: Diabetes Guidelines if Diabetic/Glucose > 140 Description: If diabetic or lab glucose is > 140 mg/dl - Initiate Diabetes/Hyperglycemia Guidelines & Document Interventions  Outcome: Progressing   Problem: RH BOWEL ELIMINATION Goal: RH STG MANAGE BOWEL WITH ASSISTANCE Description: STG Manage Bowel with  mod Assistance. Outcome: Progressing Goal: RH STG MANAGE BOWEL W/MEDICATION W/ASSISTANCE Description: STG Manage Bowel with Medication with supervision Assistance. Outcome: Progressing   Problem: RH SKIN INTEGRITY Goal: RH STG SKIN FREE OF INFECTION/BREAKDOWN Description: Skin to remain free from additional breakdown while on rehab with supervision assistance. Outcome: Progressing Goal: RH STG MAINTAIN SKIN INTEGRITY WITH ASSISTANCE Description: STG Maintain Skin Integrity With  mod I Assistance. Outcome: Progressing Goal: RH STG ABLE TO PERFORM INCISION/WOUND CARE W/ASSISTANCE Description: STG Able To Perform Incision/Wound Care With mod I Assistance. Outcome: Progressing   Problem: RH SAFETY Goal: RH STG ADHERE TO SAFETY PRECAUTIONS W/ASSISTANCE/DEVICE Description: STG Adhere to Safety Precautions With  mod I Assistance/Device. Outcome: Progressing   Problem: RH PAIN MANAGEMENT Goal: RH STG PAIN MANAGED AT OR BELOW PT'S PAIN GOAL Description: Pain free and <3  Outcome: Progressing   

## 2020-07-02 ENCOUNTER — Encounter (HOSPITAL_COMMUNITY): Payer: BC Managed Care – PPO

## 2020-07-02 ENCOUNTER — Ambulatory Visit (HOSPITAL_COMMUNITY): Payer: BC Managed Care – PPO

## 2020-07-02 ENCOUNTER — Inpatient Hospital Stay (HOSPITAL_COMMUNITY): Payer: BC Managed Care – PPO

## 2020-07-02 DIAGNOSIS — S31000D Unspecified open wound of lower back and pelvis without penetration into retroperitoneum, subsequent encounter: Secondary | ICD-10-CM

## 2020-07-02 DIAGNOSIS — T402X5A Adverse effect of other opioids, initial encounter: Secondary | ICD-10-CM

## 2020-07-02 DIAGNOSIS — G540 Brachial plexus disorders: Secondary | ICD-10-CM

## 2020-07-02 DIAGNOSIS — K5903 Drug induced constipation: Secondary | ICD-10-CM

## 2020-07-02 LAB — GLUCOSE, CAPILLARY
Glucose-Capillary: 116 mg/dL — ABNORMAL HIGH (ref 70–99)
Glucose-Capillary: 130 mg/dL — ABNORMAL HIGH (ref 70–99)
Glucose-Capillary: 122 mg/dL — ABNORMAL HIGH (ref 70–99)
Glucose-Capillary: 105 mg/dL — ABNORMAL HIGH (ref 70–99)

## 2020-07-02 MED ORDER — LINACLOTIDE 145 MCG PO CAPS
145.0000 ug | ORAL_CAPSULE | Freq: Every day | ORAL | Status: DC
  Administered 2020-07-03 – 2020-07-06 (×4): 145 ug via ORAL
  Filled 2020-07-02 (×4): qty 1

## 2020-07-02 MED ORDER — DAKINS (1/4 STRENGTH) 0.125 % EX SOLN
Freq: Two times a day (BID) | CUTANEOUS | Status: AC
  Filled 2020-07-02: qty 473
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
## 2020-07-02 NOTE — Progress Notes (Signed)
Progress Note  3 Days Post-Op  Subjective: Patient reported increased pain to sacrum today but reported it was because he got his pain medication later than usual.   Objective: Vital signs in last 24 hours: Temp:  [97.1 F (36.2 C)-98.2 F (36.8 C)] 98.2 F (36.8 C) (11/22 0412) Pulse Rate:  [90-100] 97 (11/22 0412) Resp:  [18-20] 18 (11/22 0412) BP: (103-127)/(76-91) 103/76 (11/22 0412) SpO2:  [94 %-98 %] 96 % (11/22 0412) Last BM Date: 06/27/20  Intake/Output from previous day: 11/21 0701 - 11/22 0700 In: 340 [P.O.:340] Out: 775 [Urine:775] Intake/Output this shift: Total I/O In: 280 [P.O.:280] Out: -   PE: GU: sacral wound as seen below, fibrinous exudate in wound base with some odor, no tracts noted although wound is fairly deep      Lab Results:  Recent Labs    07/01/20 0739  WBC 8.3  HGB 11.5*  HCT 36.5*  PLT 385   BMET No results for input(s): NA, K, CL, CO2, GLUCOSE, BUN, CREATININE, CALCIUM in the last 72 hours. PT/INR No results for input(s): LABPROT, INR in the last 72 hours. CMP     Component Value Date/Time   NA 139 06/28/2020 1753   K 4.0 06/28/2020 1753   CL 103 06/28/2020 1753   CO2 24 06/28/2020 1753   GLUCOSE 114 (H) 06/28/2020 1753   BUN 9 06/28/2020 1753   CREATININE 0.53 (L) 06/28/2020 1753   CALCIUM 9.4 06/28/2020 1753   PROT 6.0 (L) 06/28/2020 1753   ALBUMIN 2.9 (L) 06/28/2020 1753   AST 17 06/28/2020 1753   ALT 24 06/28/2020 1753   ALKPHOS 54 06/28/2020 1753   BILITOT 0.4 06/28/2020 1753   GFRNONAA >60 06/28/2020 1753   GFRAA >60 05/15/2020 0415   Lipase  No results found for: LIPASE     Studies/Results: No results found.  Anti-infectives: Anti-infectives (From admission, onward)   Start     Dose/Rate Route Frequency Ordered Stop   06/18/20 2000  amoxicillin-clavulanate (AUGMENTIN) 875-125 MG per tablet 1 tablet        1 tablet Oral Every 12 hours 06/18/20 1346 06/21/20 0842   06/17/20 2359   piperacillin-tazobactam (ZOSYN) IVPB 3.375 g  Status:  Discontinued        3.375 g 12.5 mL/hr over 240 Minutes Intravenous Every 8 hours 06/16/20 2353 06/17/20 0013   06/17/20 0800  vancomycin (VANCOREADY) IVPB 1250 mg/250 mL  Status:  Discontinued        1,250 mg 166.7 mL/hr over 90 Minutes Intravenous Every 8 hours 06/17/20 0258 06/18/20 1346   06/17/20 0030  vancomycin (VANCOREADY) IVPB 2000 mg/400 mL        2,000 mg 200 mL/hr over 120 Minutes Intravenous  Once 06/16/20 2353 06/17/20 0435   06/17/20 0030  piperacillin-tazobactam (ZOSYN) IVPB 3.375 g  Status:  Discontinued        3.375 g 12.5 mL/hr over 240 Minutes Intravenous Every 8 hours 06/17/20 0013 06/18/20 1346       Assessment/Plan Dm2 HTN Hx of COVID 9/23  Sacral Wound S/p DEBRIDEMENT SACRAL WOUND 11/19 Dr. Derrell Lolling - POD#3 - Continue hydrotherapy and BID wet to dry dressing changes  - continue Dakin's another 72 hrs and then transition to saline wet to dry dressing - no indication for further surgical debridement at this time   ID - currently none FEN - CM diet VTE - ASA and lovenox today Foley - none Follow up - wound clinic?  LOS: 24 days  Juliet Rude , [facility] Surgery 07/02/2020, 11:07 AM Please see Amion for pager number during day hours 7:00am-4:30pm

## 2020-07-02 NOTE — Progress Notes (Signed)
Pt had large hard BM with therapy, frank blood noted with clots. Notified PA, no new orders at this time.

## 2020-07-02 NOTE — Progress Notes (Signed)
Occupational Therapy Weekly Progress Note  Patient Details  Name: Collin Brown MRN: [id number] Date of Birth: Jan 19, 1986  Beginning of progress report period: June 25, 2020 End of progress report period: July 02, 2020  Today's Date: 07/02/2020 OT Individual Time: 8828-0034 OT Individual Time Calculation (min): 84 min    Patient has met 2 of 12 long term goals.  Short term goals not set due to estimated length of stay. Pt has made slow progress this reporting period. He remains limited by pain from his sacral wound. Pt did undergo an I & D sx and has had slight improvement in pain management. Self limiting behaviors also beginning to affect pt progress. Pt has been inconsistent with his ADL transfers, ranging from being unable to stand from EOB to CGA, and pt admits anxiety is likely playing a roll in his daily performance. His LUE has had some return in finger flexion and overall sensitivity but remains hypersensitive, painful, and with a 1 finger sublux. Family edu is beginning today and the team will continue to monitor readiness for Thursday d/c.   Patient continues to demonstrate the following deficits: muscle weakness, decreased cardiorespiratoy endurance, decreased safety awareness and decreased standing balance and decreased balance strategies and therefore will continue to benefit from skilled OT intervention to enhance overall performance with BADL and Reduce care partner burden.  See Patient's Care Plan for progression toward long term goals.  Patient progressing toward long term goals..  Continue plan of care.  Skilled Therapeutic Interventions/Progress Updates:    Pt received supine c/o 5/10 pain in his sacral wound, OOB activity used as pain management, as well as positioning throughout session. Thigh high teds donned supine with total A. Pt completed bed mobility to EOB with mod A for lifting trunk and min cueing. Pt sat EOB with mod I and completed UB bathing with min A.  Min A to don gown with mod cueing used for hemi technique. Pt stood from EOB after several attempts with min A. Discussed impact of anxiety on these transfers, as OT suspects pt is often limited by the anticipation of pain and effort. Pt used the urinal seated but was unable to void. He again attempted to stand for peri hygiene but was unable to after several attempts. He requested to use the bedpan but OT provided extended encouragement and edu re need to wean from bed level activity for upcoming d/c. Pt reported he needed to give his bottom a rest break and he returned to sidelying with CGA. After extended rest break pt returned to EOB. Increased encouragement provided by OT. Pt completed sit > stand with min A. Stand pivot transfer with CGA to [facility]. Pt voided urine and medium, hard BM with blood/clot present. RN notified who entered room. Pt reporting increased pain and requested to return to bed and use bedpan to finish. Pt completed sit > stand from shower chair with CGA and stand pivot back to bed. He transitioned to supine with supervision. Pt was left supine with all needs met.   Therapy Documentation Precautions:  Precautions Precautions: Fall Precaution Comments: flaccid LUE (protect from subluxation); sling when OOB for comfort; skin integrity (sacral wound); monitor vitals Required Braces or Orthoses: Sling Restrictions Weight Bearing Restrictions: No   Therapy/Group: Individual Therapy  Curtis Sites 07/02/2020, 6:53 AM

## 2020-07-02 NOTE — Progress Notes (Signed)
Discuss with patient regarding his family verbal concerns regarding issues with his care and administration of his medications. He verbalized they were just concern and wanted to know what that he was alright. Reassurance and support provided, patient encourage to ventilate these concerns as need

## 2020-07-02 NOTE — Progress Notes (Signed)
Occupational Therapy Session Note  Patient Details  Name: Collin Brown MRN: [id number] Date of Birth: 10/18/1985  Today's Date: 07/02/2020 OT Individual Time: 1100-1155 OT Individual Time Calculation (min): 55 min    Short Term Goals: Week 4:  OT Short Term Goal 1 (Week 4): STG= LTG d/t ELOS  Skilled Therapeutic Interventions/Progress Updates:    Pt received supine with high levels of pain in his sacrum from hydrotherapy, requesting to not get OOB for session. Pt's mother did not come for scheduled family edu session d/t transportation issues. E-stim was applied to pt's L shoulder as described below. 1:1 NMES applied to supraspinatus and middle deltoid to help approximate shoulder joint to reduce sublux and reduce pain.  Ratio 1:3 Rate 35 pps Waveform- Asymmetric Ramp 1.0 Pulse 300 Intensity- 21 Duration -  15 min  Report of pain at the beginning of session: 0/10 arm Report of pain at the end of session: 0/10 arm  No adverse reactions after treatment and is skin intact.   NMES was also applied to pt's triceps to increase muscle activation with same settings as above. Pt completed guided AAROM throughout both sets of NMES application. Pt with good activation in flexor synergy pattern, but very little extension activation. Kinesiotape was applied to pt's L shoulder to help approximate joint. PROM to all joints performed with increased tolerance. Pt left sidelying with all needs met.    Therapy Documentation Precautions:  Precautions Precautions: Fall Precaution Comments: flaccid LUE (protect from subluxation); sling when OOB for comfort; skin integrity (sacral wound); monitor vitals Required Braces or Orthoses: Sling Restrictions Weight Bearing Restrictions: No   Therapy/Group: Individual Therapy   H  07/02/2020, 11:22 AM  

## 2020-07-02 NOTE — Progress Notes (Signed)
[facility] PHYSICAL MEDICINE & REHABILITATION PROGRESS NOTE   Subjective/Complaints:  Constipated. Had bm, but stool very hard, caused mild ano-rectal trauma. Sacral pain present but better since Friday. Not requiring oxygen any more.   ROS: Patient denies fever, rash, sore throat, blurred vision, nausea, vomiting, diarrhea, cough, shortness of breath or chest pain, headache, or mood change.    No results found. Recent Labs    07/01/20 0739  WBC 8.3  HGB 11.5*  HCT 36.5*  PLT 385   No results for input(s): NA, K, CL, CO2, GLUCOSE, BUN, CREATININE, CALCIUM in the last 72 hours.  Intake/Output Summary (Last 24 hours) at 07/02/2020 1217 Last data filed at 07/02/2020 9417 Gross per 24 hour  Intake 502 ml  Output 425 ml  Net 77 ml     Pressure Injury 06/08/20 Coccyx Unstageable - Full thickness tissue loss in which the base of the injury is covered by slough (yellow, tan, gray, green or brown) and/or eschar (tan, brown or black) in the wound bed. yellow necrotic tissue in the crease o (Active)  06/08/20 1830 (with Lizabeth Leyden RN)  Location: Coccyx  Location Orientation:   Staging: Unstageable - Full thickness tissue loss in which the base of the injury is covered by slough (yellow, tan, gray, green or brown) and/or eschar (tan, brown or black) in the wound bed.  Wound Description (Comments): yellow necrotic tissue in the crease originally put in as a nonpressure wound  Present on Admission: Yes    Physical Exam: Vital Signs Blood pressure 103/76, pulse 97, temperature 98.2 F (36.8 C), resp. rate 18, height 5\' 10"  (1.778 m), weight (!) 156.9 kg, SpO2 96 %.   Constitutional: No distress . Vital signs reviewed. HEENT: EOMI, oral membranes moist Neck: supple Cardiovascular: RRR without murmur. No JVD    Respiratory/Chest: CTA Bilaterally without wheezes or rales. Normal effort    GI/Abdomen: BS +, non-tender, non-distended Ext: no clubbing, cyanosis, or edema Psych:  pleasant and cooperative Skin: sacral wound with increased granulation tissue/area now open after I&D. Area tender, bleeding Neuro: AOx3 Cranial nerves 2-12 are intact. DTR's 1+.  Fine motor coordination is intact. No tremors. LUE still limited motor wise especially proximally.  Musculoskeletal: Full ROM, No pain with AROM or PROM in the neck, trunk, or LE's.  LUE tolerating PROM better.    Assessment/Plan: 1. 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  Physiatrist is providing close team supervision and 24 hour management of active medical problems listed below.  Physiatrist and rehab team continue to assess barriers to discharge/monitor patient progress toward functional and medical goals  Care Tool:  Bathing    Body parts bathed by patient: Left arm, Chest, Abdomen, Face   Body parts bathed by helper: Right arm     Bathing assist Assist Level: Minimal Assistance - Patient > 75%     Upper Body Dressing/Undressing Upper body dressing   What is the patient wearing?: Hospital gown only    Upper body assist Assist Level: Minimal Assistance - Patient > 75%    Lower Body Dressing/Undressing Lower body dressing      What is the patient wearing?: Pants     Lower body assist Assist for lower body dressing: Maximal Assistance - Patient 25 - 49%     Toileting Toileting    Toileting assist Assist for toileting: Moderate Assistance - Patient 50 - 74%  Transfers Chair/bed transfer  Transfers assist  Chair/bed transfer activity did not occur: Safety/medical concerns  Chair/bed transfer assist level: Minimal Assistance - Patient > 75% Chair/bed transfer assistive device: Walker (L PFRW)   Locomotion Ambulation   Ambulation assist   Ambulation activity did not occur: Safety/medical concerns  Assist level: 2 helpers (minA of 1 and +2 w/c  follow) Assistive device: Walker-platform Max distance: 75ft   Walk 10 feet activity   Assist  Walk 10 feet activity did not occur: Safety/medical concerns  Assist level: 2 helpers (minA of 1 and +2 w/c follow) Assistive device: Walker-platform   Walk 50 feet activity   Assist Walk 50 feet with 2 turns activity did not occur: Safety/medical concerns (did not perform turns)  Assist level: 2 helpers (minA of 1 and +2 w/c follow) Assistive device: Walker-platform    Walk 150 feet activity   Assist Walk 150 feet activity did not occur: Safety/medical concerns  Assist level: 2 helpers      Walk 10 feet on uneven surface  activity   Assist Walk 10 feet on uneven surfaces activity did not occur: Safety/medical concerns         Wheelchair     Assist Will patient use wheelchair at discharge?: No   Wheelchair activity did not occur: Safety/medical concerns         Wheelchair 50 feet with 2 turns activity    Assist    Wheelchair 50 feet with 2 turns activity did not occur: Safety/medical concerns       Wheelchair 150 feet activity     Assist  Wheelchair 150 feet activity did not occur: Safety/medical concerns       Blood pressure 103/76, pulse 97, temperature 98.2 F (36.8 C), resp. rate 18, height 5\' 10"  (1.778 m), weight (!) 156.9 kg, SpO2 96 %.  Medical Problem List and Plan: 1.  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             -continue therapies as tolerated.   Antithrombotics: -DVT/anticoagulation: Lovenox 80 mg daily per surgery             -antiplatelet therapy: Aspirin 81 mg daily 3. Pain Management: sacrum/LUE  Advil as needed             Oxycodone as needed             11/5: pain improved with 400mg  tid of gabapentin  11/6-9:  stable  11/12: continue gabapentin, kpad   -dilaudid for more severe pain   -increased dilaudid to 4mg  30 min prior to wound care   11/22: continue oxycontin 10mg  q12H  and oxycodone prn q6H.    Pt is better after debridedment of sacral ulcer 4. Mood: Provide emotional support             -antipsychotic agents: N/A 5. Neuropsych: This patient is capable of making decisions on his own behalf. 6. 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 Wound care orders per surgery/WOC RN   -packed gauzed moistened with Dakin's soln  7. Fluids/Electrolytes/Nutrition: encourage po 8.  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  Patient completed 5-day course Solu-Medrol             Supportive care with range of motion             Recommend NCV/EMG as outpatient.  -increased gabapentin as above 9.  New onset diabetes mellitus, hemoglobin A1c 7.2.  NovoLog 4 units 3 times daily, Levemir 15 units twice daily.               CBG (last 3)  Recent Labs    07/01/20 2102 07/02/20 0559 07/02/20 1125  GLUCAP 195* 116* 130*   Fair control 11/22 10.  Hypertension with tachycardia.  Lopressor 50 mg twice daily.    11/22 bp and HR better controlled 11.  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  Dietary follow-up  11/14- BMI down to 49.63 12. Fever, UCX with 100 Klebsiella  -stool sample also positive for salmonella  -chest xray and exam unremarkable  -blood cultures pending but negative so far  -abx narrowed to augmentin which completed 11/11  11/21 cbc wnl  13. Hypokalemia: K+ 3.1 on 11/10: supplement Kdur BID and repeat tomorrow.   -11/11 k+ 3.4   -continue supplementation   11/18 BMET WNL 14. Constipation  11/22 hard bm this morning   -on miralax and senna-s bid   -add linzess tomorrow morning       LOS: 24 days A FACE TO FACE EVALUATION WAS PERFORMED  Ranelle Oyster 07/02/2020, 12:17 PM

## 2020-07-02 NOTE — Progress Notes (Signed)
Family members( mother and brother) spoke with CNEulah Citizen, RN) and staff nurse Coralee Pesa to verbalize their concerns regarding patient's pain and general care.The mother was belligerent, inappropriate and according to CN using profanity statements that she will get a lawyer and sue .Supportive communication was provided with follow up by CN,and assigned RN.

## 2020-07-02 NOTE — Progress Notes (Signed)
Physical Therapy Wound Treatment Patient Details  Name: Collin Brown MRN: [id number] Date of Birth: [birth year]  Today's Date: 07/02/2020 PT Individual Time: 1010-1045 PT Individual Time Calculation (min): 35 min   Subjective  Subjective: Pt reports pain but agreeable to hydrotherapy.   Patient and Family Stated Goals: None stated at this time.  Date of Onset:  (Unknown) Prior Treatments: dressing changes  Pain Score:  Patient was premedicated.  Reports pain still 8/10 burning.  Reports eased with dressing.   Wound Assessment  Pressure Injury 06/08/20 Coccyx Unstageable - Full thickness tissue loss in which the base of the injury is covered by slough (yellow, tan, gray, green or brown) and/or eschar (tan, brown or black) in the wound bed. yellow necrotic tissue in the crease o (Active)  Dressing Type ABD;Barrier Film (skin prep);Gauze (Comment);Moist to dry 07/02/20 1100  Dressing Changed;Clean;Dry;Intact 07/02/20 1100  Dressing Change Frequency Daily 07/02/20 1100  State of Healing Early/partial granulation 07/02/20 1100  Site / Wound Assessment Red;Yellow;Granulation tissue;Other (Comment)White connective at base 07/02/20 1100  % Wound base Red or Granulating 60% 07/02/20 1100  % Wound base Yellow/Fibrinous Exudate 30% 07/02/20 1100  % Wound base Black/Eschar 0% 06/30/20 1345  % Wound base Other/Granulation Tissue (Comment) 10% 07/02/20 1100  Peri-wound Assessment Intact 07/02/20 1100  Wound Length (cm) 3.4 cm 06/30/20 1000  Wound Width (cm) 3.5 cm 06/30/20 1000  Wound Depth (cm) 7.8 cm 06/30/20 1000  Wound Surface Area (cm^2) 11.9 cm^2 06/30/20 1000  Wound Volume (cm^3) 92.82 cm^3 06/30/20 1000  Tunneling (cm) 3 at 5:00; 3.3 at 12:00 06/30/20 1000  Undermining (cm) 4 cm at 12:00; 4.5 cm at 3:00 06/25/20 1026  Margins Attached edges (approximated) 07/02/20 1100  Drainage Amount Moderate 07/02/20 1100  Drainage Description Serosanguineous 07/02/20 1100  Treatment Debridement  (Selective);Hydrotherapy (Pulse lavage);Other (Comment) Dakin's soaked gauze 07/02/20 1100     Hydrotherapy Pulsed lavage therapy - wound location: sacrum Pulsed Lavage with Suction (psi): 10 psi Pulsed Lavage with Suction - Normal Saline Used: 1000 mL Pulsed Lavage Tip: Tip with splash shield Selective Debridement Selective Debridement - Location: sacrum Selective Debridement - Tools Used: Forceps;Scissors Selective Debridement - Tissue Removed: yellow necrotic tissue from 3:00-5:00; Loose white tissue at base   Wound Assessment and Plan  Wound Therapy - Assess/Plan/Recommendations Wound Therapy - Clinical Statement: Wound with increased granulation and decreased necrosis.  Able to remove small amount of yellow necrosis from 3:00-5:00 area and small amount of loose white fibrous tissue with forceps from base.  Wound does still have odor but decreasing and with increased granulation.  Pt did have pain meds prior but was still painful.  PA in at start of treatment and recommended continuing Dakin's for 2-3 more days.  This patient will continue to benefit from hydrotherapy and selective removal of unviable tissue, to decrease bioburden, and promote wound bed healing.  Wound Therapy - Functional Problem List: decreased mobility Factors Delaying/Impairing Wound Healing: Immobility Hydrotherapy Plan: Debridement;Patient/family education;Pulsatile lavage with suction;Dressing change Wound Therapy - Frequency: 6X / week Wound Therapy - Follow Up Recommendations: Home health RN Wound Plan: see above  Wound Therapy Goals- Improve the function of patient's integumentary system by progressing the wound(s) through the phases of wound healing (inflammation - proliferation - remodeling) by: Decrease Necrotic Tissue to: 20 Decrease Necrotic Tissue - Progress: Progressing toward goal Increase Granulation Tissue to: 80 Increase Granulation Tissue - Progress: Progressing toward goal Goals/treatment  plan/discharge plan were made with and agreed upon by patient/family: Yes Time  For Goal Achievement: 7 days Wound Therapy - Potential for Goals: Good  Goals will be updated until maximal potential achieved or discharge criteria met.  Discharge criteria: when goals achieved, discharge from hospital, MD decision/surgical intervention, no progress towards goals, refusal/missing three consecutive treatments without notification or medical reason.  GP    Abran Richard, PT Acute Rehab Services Pager [phone] [facility] [place] 07/02/2020, 11:08 AM

## 2020-07-02 NOTE — Consult Note (Signed)
WOC Nurse wound follow up Patient receiving care in [facility] 4W16. After Secure Chat with surgical PA, Trixie Deis, I have entered wound care orders. Helmut Muster, RN, MSN, CWOCN, CNS-BC, pager [phone]

## 2020-07-02 NOTE — Progress Notes (Signed)
Physical Therapy Session Note  Patient Details  Name: Collin Brown MRN: [id number] Date of Birth: 09/02/1985  Today's Date: 07/02/2020 PT Individual Time: 1300-1400 PT Individual Time Calculation (min): 60 min   Short Term Goals: Week 3:  PT Short Term Goal 1 (Week 3): Patient will perform bed mobility with CGA without use of hospital bed functions. PT Short Term Goal 2 (Week 3): Patient will perform basic transfers with min A consistently. PT Short Term Goal 3 (Week 3): Patient will ambulate 100 ft using LRAD with min A. PT Short Term Goal 4 (Week 3): Patient will initiate stair training.  Skilled Therapeutic Interventions/Progress Updates:     Patient in bed lying on his back upon PT arrival. Patient alert and agreeable to PT session. Patient reported improved sacral pain, stating it was minimal during session, patient premedicated prior to session, but reporting "I feel high" RN made aware. PT provided repositioning, rest breaks, and distraction as pain interventions throughout session.   Session limited by labile BP with symptoms in standing.  Vitals: With B TEDs donned  Supine: BP 119/73, HR 104, SPO2 94% on RA Sitting: BP 133/97, HR 113, SPO2 95% on RA Standing: BP 74/57, HR 122, SPO2 100% on RA (symptomatic, light-headed)  With B TEDs and abdominal binder donned Supine: BP 101/78, HR 105, SPO2 95% on RA Sitting: BP 130/91, HR 115, SPO2 96% on RA Standing: BP 131/108, HR 90, SPO2 98% on RA (symptomatic, nausea/woozy)  Therapeutic Activity: Bed Mobility: Patient performed supine to/from sit with min-mod A to come to sitting and supervision to return to lying x2. Provided verbal cues and facilitation for rolling onto R elbow and pushing to sit up to his elbow then his hand. Donned/doffed L arm sling in sitting x2 with max A. Transfers: Patient performed sit to/from stand x2 with mod A for boosting up. Provided verbal cues for forward weight shift, set-up, and increased breath  support, as patient tends to hold his breath.  Educated on causes of orthostatic hypotension, which may not be unexpected due to patient recently spending less time OOB due to sacral pain. Discussed POC and progression. Recommending to increased LOS due to decreased functional level from sacral pain and surgery to wound site last week. Patient in agreement.   Patient in bed at end of session with breaks locked, bed alarm set, and all needs within reach.    Therapy Documentation Precautions:  Precautions Precautions: Fall Precaution Comments: flaccid LUE (protect from subluxation); sling when OOB for comfort; skin integrity (sacral wound); monitor vitals Required Braces or Orthoses: Sling Restrictions Weight Bearing Restrictions: No   Therapy/Group: Individual Therapy  Lennyx Verdell L Jeorge Reister PT, DPT  07/02/2020, 7:52 PM

## 2020-07-03 ENCOUNTER — Inpatient Hospital Stay (HOSPITAL_COMMUNITY): Payer: BC Managed Care – PPO

## 2020-07-03 ENCOUNTER — Inpatient Hospital Stay (HOSPITAL_COMMUNITY): Payer: BC Managed Care – PPO | Admitting: Occupational Therapy

## 2020-07-03 LAB — GLUCOSE, CAPILLARY
Glucose-Capillary: 136 mg/dL — ABNORMAL HIGH (ref 70–99)
Glucose-Capillary: 98 mg/dL (ref 70–99)
Glucose-Capillary: 106 mg/dL — ABNORMAL HIGH (ref 70–99)
Glucose-Capillary: 120 mg/dL — ABNORMAL HIGH (ref 70–99)

## 2020-07-03 MED ORDER — OXYCODONE HCL ER 10 MG PO T12A
20.0000 mg | EXTENDED_RELEASE_TABLET | Freq: Two times a day (BID) | ORAL | Status: DC
  Administered 2020-07-03 – 2020-07-13 (×20): 20 mg via ORAL
  Filled 2020-07-03 (×20): qty 2

## 2020-07-03 MED ORDER — OXYCODONE HCL ER 10 MG PO T12A
10.0000 mg | EXTENDED_RELEASE_TABLET | Freq: Once | ORAL | Status: AC
  Administered 2020-07-03: 10 mg via ORAL
  Filled 2020-07-03: qty 1

## 2020-07-03 NOTE — Progress Notes (Signed)
Occupational Therapy Session Note  Patient Details  Name: Collin Brown MRN: [id number] Date of Birth: 24-Jul-1986  Today's Date: 07/03/2020 OT Individual Time: 1430-1530 OT Individual Time Calculation (min): 60 min    Short Term Goals: Week 4:  OT Short Term Goal 1 (Week 4): STG= LTG d/t ELOS  Skilled Therapeutic Interventions/Progress Updates:    Patient in bed, notes pain in buttocks due to recent wound care but able to tolerate therapy session.  Completed left arm AAROM scapula to forearm in side lying.  Side lying to sitting edge of bed with min A.  He tolerated unsupported sitting for 30 minutes with focus on self care and UB therapeutic exercise/HEP.  Set up needed to wash face and complete oral care.  Provided and practiced arm HEP (shoulder to hand) and theraputty exercises to be completed with both hands/arms - HOH needed for positioning of left arm for most distal activities.  Returned to side lying with CGA.  Assisted with positioning/pillow support.  He remained in bed at close of session, call bell and tray table in reach.    Therapy Documentation Precautions:  Precautions Precautions: Fall Precaution Comments: flaccid LUE (protect from subluxation); sling when OOB for comfort; skin integrity (sacral wound); monitor vitals Required Braces or Orthoses: Sling Restrictions Weight Bearing Restrictions: No  Therapy/Group: Individual Therapy  [name] 07/03/2020, 7:49 AM

## 2020-07-03 NOTE — Progress Notes (Signed)
Physical Therapy Wound Treatment Patient Details  Name: Collin Brown MRN: [id number] Date of Birth: 04/24/1986  Today's Date: 07/03/2020 PT Individual Time: 7253-6644 PT Individual Time Calculation (min): 26 min   Subjective  Subjective: Pt reports feeling better today from a pain perspective.  Patient and Family Stated Goals: Heal wound Date of Onset:  (Unknown) Prior Treatments: dressing changes  Pain Score: Premedicated and tolerated well without complaints of pain.   Wound Assessment  Pressure Injury 06/08/20 Coccyx Unstageable - Full thickness tissue loss in which the base of the injury is covered by slough (yellow, tan, gray, green or brown) and/or eschar (tan, brown or black) in the wound bed. yellow necrotic tissue in the crease o (Active)  Dressing Type ABD;Barrier Film (skin prep);Gauze (Comment);Moist to dry 07/03/20 1337  Dressing Changed;Clean;Dry;Intact 07/03/20 1337  Dressing Change Frequency Daily 07/03/20 1337  State of Healing Early/partial granulation 07/03/20 1337  Site / Wound Assessment Red;Pink;Yellow 07/03/20 1337  % Wound base Red or Granulating 60% 07/03/20 1337  % Wound base Yellow/Fibrinous Exudate 30% 07/03/20 1337  % Wound base Black/Eschar 0% 07/03/20 1337  % Wound base Other/Granulation Tissue (Comment) 10% 07/03/20 1337  Peri-wound Assessment Intact;Pink 07/03/20 1337  Wound Length (cm) 3.4 cm 06/30/20 1000  Wound Width (cm) 3.5 cm 06/30/20 1000  Wound Depth (cm) 7.8 cm 06/30/20 1000  Wound Surface Area (cm^2) 11.9 cm^2 06/30/20 1000  Wound Volume (cm^3) 92.82 cm^3 06/30/20 1000  Tunneling (cm) 3 at 5:00; 3.3 at 12:00 06/30/20 1000  Undermining (cm) 4 cm at 12:00; 4.5 cm at 3:00 06/25/20 1026  Margins Unattached edges (unapproximated) 07/03/20 1337  Drainage Amount Moderate 07/03/20 1337  Drainage Description Serosanguineous 07/03/20 1337  Treatment Debridement (Selective);Hydrotherapy (Pulse lavage);Packing (Saline gauze) 07/03/20 1337    Santyl applied to wound bed prior to applying dressing.     Hydrotherapy Pulsed lavage therapy - wound location: sacrum Pulsed Lavage with Suction (psi): 10 psi Pulsed Lavage with Suction - Normal Saline Used: 1000 mL Pulsed Lavage Tip: Tip with splash shield Selective Debridement Selective Debridement - Location: sacrum Selective Debridement - Tools Used: Forceps;Scissors Selective Debridement - Tissue Removed: Yellow unviable tissue   Wound Assessment and Plan  Wound Therapy - Assess/Plan/Recommendations Wound Therapy - Clinical Statement: Strong odor returned this session despite continued Dakin's. Yellow tissue that appears necrotic is adherent and difficult to debride. It is also still difficult to visualize the interior of the wound even after surgical debridement. Mother present and questions were answered within scope of practice. This patient will benefit from continued hydrotherapy for selective removal of unviable tissue, to decrease bioburden and promote wound bed healing.  Wound Therapy - Functional Problem List: decreased mobility Factors Delaying/Impairing Wound Healing: Immobility Hydrotherapy Plan: Debridement;Patient/family education;Pulsatile lavage with suction;Dressing change Wound Therapy - Frequency: 6X / week Wound Therapy - Follow Up Recommendations: Home health RN Wound Plan: see above  Wound Therapy Goals- Improve the function of patient's integumentary system by progressing the wound(s) through the phases of wound healing (inflammation - proliferation - remodeling) by: Decrease Necrotic Tissue to: 20 Decrease Necrotic Tissue - Progress: Progressing toward goal Increase Granulation Tissue to: 80 Increase Granulation Tissue - Progress: Progressing toward goal Goals/treatment plan/discharge plan were made with and agreed upon by patient/family: Yes Time For Goal Achievement: 7 days Wound Therapy - Potential for Goals: Good  Goals will be updated until maximal  potential achieved or discharge criteria met.  Discharge criteria: when goals achieved, discharge from hospital, MD decision/surgical intervention, no progress  towards goals, refusal/missing three consecutive treatments without notification or medical reason.  GP      D  07/03/2020, 2:52 PM   , PT, DPT Acute Rehabilitation Services Pager: [phone] Office: [phone]    

## 2020-07-03 NOTE — Progress Notes (Signed)
Physical Therapy Weekly Progress Note  Patient Details  Name: Collin Brown MRN: [id number] Date of Birth: 08-10-1986  Beginning of progress report period: June 26, 2020 End of progress report period: July 03, 2020  Today's Date: 07/03/2020 PT Individual Time: 2122-4825 and 1125-1203 PT Individual Time Calculation (min): 70 min and 38 min   Patient has met 0 of 4 short term goals.  Patient with decline in function due to sacral wound pain and surgery this week. Patient spent most of the week lying in bed due to pain, unable to tolerate sitting and transfers most of the time. He is demonstrating improved sitting tolerance and pain since surgery, however, has been limited by labile HR/BP with symptoms in standing. He currently requires mod-min A for bed mobility and transfers, and has ambulated up to 50 feet at the beginning of the week using a RW with min A.  Patient continues to demonstrate the following deficits muscle weakness, decreased cardiorespiratoy endurance, abnormal tone and decreased standing balance, decreased postural control and decreased balance strategies and therefore will continue to benefit from skilled PT intervention to increase functional independence with mobility.  Patient progressing toward long term goals..  Plan of care revisions: Extending LOS due to limited progress this week secondary to sacral ulcer. Will monitor progress and adjust POC and goals accordingly..  PT Short Term Goals Week 2:  PT Short Term Goal 1 (Week 2): Patient will perform bed mobility with CGA without use of hospital bed functions. PT Short Term Goal 1 - Progress (Week 2): Progressing toward goal PT Short Term Goal 2 (Week 2): Patient will perform basic transfers with min A consitently. PT Short Term Goal 2 - Progress (Week 2): Progressing toward goal PT Short Term Goal 3 (Week 2): Patient will ambulate 100 ft using LRAD with mod A. PT Short Term Goal 3 - Progress (Week 2):  Progressing toward goal Week 3:  PT Short Term Goal 1 (Week 3): Patient will perform bed mobility with CGA without use of hospital bed functions. PT Short Term Goal 1 - Progress (Week 3): Progressing toward goal PT Short Term Goal 2 (Week 3): Patient will perform basic transfers with min A consistently. PT Short Term Goal 2 - Progress (Week 3): Progressing toward goal PT Short Term Goal 3 (Week 3): Patient will ambulate 100 ft using LRAD with min A. PT Short Term Goal 3 - Progress (Week 3): Progressing toward goal PT Short Term Goal 4 (Week 3): Patient will initiate stair training. PT Short Term Goal 4 - Progress (Week 3): Progressing toward goal Week 4:  PT Short Term Goal 1 (Week 4): Patient will perform bed mobility with CGA without use of hospital bed functions. PT Short Term Goal 2 (Week 4): Patient will perform basic transfers with min A consistently. PT Short Term Goal 3 (Week 4): Patient will ambulate 100 ft using LRAD with min A. PT Short Term Goal 4 (Week 4): Patient will initiate stair training.  Skilled Therapeutic Interventions/Progress Updates:     Session 1: Patient in bed upon PT arrival. Patient alert and agreeable to PT session. Patient reported 4/10 sacral pain in lying that increased to 8/10 pain with mobility during session, RN and MD made aware. PT provided repositioning, rest breaks, and distraction as pain interventions throughout session. Patient reported poor sleep last night due to pain, and expressed concerns about nursing staff and pain management. MD rounded during session, discussed d/c planning, pain management, wound care, labile vitals and  benefits of increased mobility. Plan to monitor vitals based on symptoms during sessions.    Therapeutic Activity: Bed Mobility: Donned B TEDs bed level with total A prior to mobility. Patient performed supine to sit with min A and sit to supine with supervision. Provided verbal cues for placement of R elbow to set and push up  and facilitated full roll onto R side for improved mechanics for coming to sitting. Transfers: Patient sat EOB with supervision 10-15 min x2. Donned pants, pulled up to thighs, shoes, and doffed/donned hospital gown sitting EOB with total-max A for energy conservation/time management. He attempted to stand multiple times, limited by sacral wound pain and bed elevation feature not working at this time. Unable to attain +2 assist to facilitated boosting up. Provided cues for set-up, forward weight shift and breath support throughout. Patient expressed increased frustration and anxiety throughout with minimal effort to push to standing each trial. Provided prolonged lying rest break between trials due to sacral pain. Initiated pain neuroscience reeducation and provided cues for relaxation and guided imagery for pain management during rest break.   Patient required increased time and encouragement with all mobility.  Patient in R side-lying in the bed at end of session with breaks locked and all needs within reach.    Session 2: Patient in bed with his mother in the room upon PT arrival. Patient alert and agreeable to PT session. Patient reported 3/10 sacral pain during session, RN made aware. PT provided repositioning, rest breaks, and distraction as pain interventions throughout session.   Therapeutic Activity: Bed Mobility: Patient performed supine to/from sit as above. Patient sat EOB with supervision, donned pants and non-skid socks with total-max A sitting EOB. Transfers: Patient performed sit to/from stand x3 with mod-min A of 2 x2 and 1 x1. Provided verbal cues for set-up, forward weight shift and breath support throughout. .  Gait Training:  Patient ambulated 50 feet and 22 feet using L PFRW with min A and +2 w/c follow for safety. Ambulated with decreased gait speed, decreased step length and height, increased BOS, forward trunk lean, and downward head gaze. Provided verbal cues for erect  posture, paced breathing, and encouragement for increased distance.  Patient with mild nausea following ambulation, recovered in sitting and resolved in lying. No observed or reported SOB or lightheadedness this session.   Neuromuscular Re-ed: Patient performed the following L upper extremity motor control activities: -gross grip 2x5 -wrist extension x5 with prolonged hold with manual over pressure for stretch -shoulder abduction with facilitation of scapular motion to 90 degrees x10  Patient in R side-lying in the bed with his mother in the room at end of session with breaks locked and all needs within reach.    Therapy Documentation Precautions:  Precautions Precautions: Fall Precaution Comments: flaccid LUE (protect from subluxation); sling when OOB for comfort; skin integrity (sacral wound); monitor vitals Required Braces or Orthoses: Sling Restrictions Weight Bearing Restrictions: No  Therapy/Group: Individual Therapy  Valynn Schamberger L Jaqualin Serpa PT, DPT  07/03/2020, 4:54 PM

## 2020-07-03 NOTE — Patient Care Conference (Signed)
Inpatient RehabilitationTeam Conference and Plan of Care Update Date: 07/03/2020   Time: 10:23 AM    Patient Name: Collin Brown      Medical Record Number: [id number]  Date of Birth: 02-24-1986 Sex: Male         Room/Bed: 4W16C/4W16C-01 Payor Info: Payor: BLUE [employer] BLUE [employer]IELD / Plan: BCBS COMM PPO / Product Type: *No Product type* /    Admit Date/Time:  06/08/2020 12:01 PM  Primary Diagnosis:  Critical illness myopathy  Hospital Problems: Principal Problem:   Critical illness myopathy Active Problems:   Debility   Essential hypertension   Diabetes mellitus, new onset (HCC)   Brachial plexopathy   Sepsis due to undetermined organism (HCC)   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 Faith Rogue Care Coodinator Present: Lavera Guise, BSW;Denaya Horn [name], RN, BSN, CRRN Nurse Present: Other (comment) Freddrick March, RN) PT Present: Serina Cowper, PT OT Present: Jake Shark, OT PPS Coordinator present : Edson Snowball, Park Breed, SLP     Current Status/Progress Goal Weekly Team Focus  Bowel/Bladder     continent b/b   remain continent of b/b   assist with toileting needs q 2 hr  Swallow/Nutrition/ Hydration             ADL's   Min A UB dressing, mod A LB dressing, CGA-min A trnasfers, sacral pain limiting. Some return in LUE gross grasp, wrist and elbow flexion, none to trace in extensors  CGA overall  Pain mangement, ADL transfers, ADL retraining, LUE NMR/desensitization, family edu   Mobility   Decline in function this week secondary to sacral wound pain/surgery.  improved sitting tolerance and pain since surgery, however, has been limited by labile HR/BP with symptoms in standing. Currently mod-min A for bed mobility and transfers, and has ambulated up to 50 feet at the beginning of the week  using a RW with min A.  Supervision-CGA overall (will consider downgrading based on patient's progress)  Functional mobility, activity tolerance, gait an stair training, L upper extremity NMR/positioning, pressure relief, balance, patient/caregiver educaiton   Communication             Safety/Cognition/ Behavioral Observations            Pain   Continious pain to buttok wound site and left upper shoulder, continue medical regime  pain level <6/10  Assess QS/PRN,provide alternative measures to assist in relieving pain   Skin   Wound unstageable site, continue dailt hydrotherapy and nightly dressing change per orders  remain free of new skin breakdown and infection  QS/PRN assessement with notification of changes to MD/PRN     Discharge Planning:  D/c to home with support from his mother (supervision) and his sister [name]. Fam edu completed on 11/15 1pm-3pm with mother/Connie. Pt mother now has no transportation to get pt home. No HHA in place at this time due to insurance.   Team Discussion: C/O pain 8/10, given Oxy. C/O constipation. Patient needs lots of encouragement. OT reports mother is very concerned about wound to bottom and how she is to manage it. Patient can be contact guard for transfers but needs lots of encouragement. PT reports patient has had functional set backs. He has not ambulated since 06/28/20. He is exhibiting a lot of self-limiting behaviors. He has contact guard to supervision goals.  Patient on target to meet rehab goals: no, but patient's  stay has been extended to give him a chance to get back on track to meet his goals and give his mother a chance for extensive education on the necessary wound care.   *See Care Plan and progress notes for long and short-term goals.   Revisions to Treatment Plan:  MD to be in contact with Hydrotherapy concerning sacral wound and how much longer they will be involved.  Teaching Needs: Continue family education and provide extensive  wound care education to the mother when she is here.  Current Barriers to Discharge: Inaccessible home environment, Decreased caregiver support, Home enviroment access/layout, Wound care, Lack of/limited family support, Weight, Medication compliance and Behavior  Possible Resolutions to Barriers: Continue current medications, Provide emotional support and encouragement, provide extensive wound care education to mother.      Medical Summary Current Status: sacral wound I&D'd, still receiving hydrotherapy for fibronecrotic debris. off oxygen, +constipation-->linzess  Barriers to Discharge: Wound care;Medical stability   Possible Resolutions to [employer] Focus: rx pain, ongoing wound care, daily review of labs/data   Continued Need for Acute Rehabilitation Level of Care: The patient requires daily medical management by a physician with specialized training in physical medicine and rehabilitation for the following reasons: Direction of a multidisciplinary physical rehabilitation program to maximize functional independence : Yes Medical management of patient stability for increased activity during participation in an intensive rehabilitation regime.: Yes Analysis of laboratory values and/or radiology reports with any subsequent need for medication adjustment and/or medical intervention. : Yes   I attest that I was present, lead the team conference, and concur with the assessment and plan of the team.   Kennyth Arnold G 07/03/2020, 1:16 PM

## 2020-07-03 NOTE — Progress Notes (Signed)
Patient ID: Collin Brown, male   DOB: 15-Jan-1986, 34 y.o.   MRN: [id number]  SW met with pt and pt mother in room to provide updates from team conference, and change in pt d/c date from 11/26 to 12/3. SW informed pt on continued challenges with obtaining HH and will continue to work on this. Pt mother to explore who will come in for family education next week and wound care edu. SW to follow-up with pt mother about times.   Loralee Pacas, MSW, [place] Office: [phone] Cell: [phone] Fax: [phone]

## 2020-07-03 NOTE — Progress Notes (Signed)
[facility] PHYSICAL MEDICINE & REHABILITATION PROGRESS NOTE   Subjective/Complaints:  In a lot of pain overnight which appears to be partly d/t miscommunication over pain meds. Pt didn't sleep that much as a result. RN reports more odor from sacral wound today.   ROS: Patient denies fever, rash, sore throat, blurred vision, nausea, vomiting, diarrhea, cough, shortness of breath or chest pain,   headache, or mood change.     No results found. Recent Labs    07/01/20 0739  WBC 8.3  HGB 11.5*  HCT 36.5*  PLT 385   No results for input(s): NA, K, CL, CO2, GLUCOSE, BUN, CREATININE, CALCIUM in the last 72 hours.  Intake/Output Summary (Last 24 hours) at 07/03/2020 1156 Last data filed at 07/03/2020 0855 Gross per 24 hour  Intake 480 ml  Output 500 ml  Net -20 ml     Pressure Injury 06/08/20 Coccyx Unstageable - Full thickness tissue loss in which the base of the injury is covered by slough (yellow, tan, gray, green or brown) and/or eschar (tan, brown or black) in the wound bed. yellow necrotic tissue in the crease o (Active)  06/08/20 1830 (with Lizabeth Leyden RN)  Location: Coccyx  Location Orientation:   Staging: Unstageable - Full thickness tissue loss in which the base of the injury is covered by slough (yellow, tan, gray, green or brown) and/or eschar (tan, brown or black) in the wound bed.  Wound Description (Comments): yellow necrotic tissue in the crease originally put in as a nonpressure wound  Present on Admission: Yes    Physical Exam: Vital Signs Blood pressure 138/83, pulse 94, temperature 98.9 F (37.2 C), resp. rate 19, height 5\' 10"  (1.778 m), weight (!) 156.9 kg, SpO2 97 %.   Constitutional: No distress . Vital signs reviewed. obese HEENT: EOMI, oral membranes moist Neck: supple Cardiovascular: RRR without murmur. No JVD    Respiratory/Chest: CTA Bilaterally without wheezes or rales. Normal effort    GI/Abdomen: BS +, non-tender, non-distended Ext: no  clubbing, cyanosis, or edema Psych: pleasant but anxious Skin: sacral wound with increased granulation tissue/area now open after I&D. Area remains tender, bleeds, sl odor today Neuro: AOx3 Cranial nerves 2-12 are intact. DTR's 1+.  Fine motor coordination is intact. No tremors. LUE still limited motor wise especially proximally.  Musculoskeletal: Full ROM, No pain with AROM or PROM in the neck, trunk, or LE's.  LUE tolerating PROM better.    Assessment/Plan: 1. 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  Physiatrist is providing close team supervision and 24 hour management of active medical problems listed below.  Physiatrist and rehab team continue to assess barriers to discharge/monitor patient progress toward functional and medical goals  Care Tool:  Bathing    Body parts bathed by patient: Left arm, Chest, Abdomen, Face   Body parts bathed by helper: Right arm     Bathing assist Assist Level: Minimal Assistance - Patient > 75%     Upper Body Dressing/Undressing Upper body dressing   What is the patient wearing?: Hospital gown only    Upper body assist Assist Level: Minimal Assistance - Patient > 75%    Lower Body Dressing/Undressing Lower body dressing      What is the patient wearing?: Pants     Lower body assist Assist for lower body dressing: Maximal Assistance - Patient 25 - 49%     Toileting Toileting  Toileting assist Assist for toileting: Moderate Assistance - Patient 50 - 74%     Transfers Chair/bed transfer  Transfers assist  Chair/bed transfer activity did not occur: Safety/medical concerns  Chair/bed transfer assist level: Minimal Assistance - Patient > 75% Chair/bed transfer assistive device: Walker (L PFRW)   Locomotion Ambulation   Ambulation assist   Ambulation activity did not occur: Safety/medical  concerns  Assist level: 2 helpers (minA of 1 and +2 w/c follow) Assistive device: Walker-platform Max distance: 89ft   Walk 10 feet activity   Assist  Walk 10 feet activity did not occur: Safety/medical concerns  Assist level: 2 helpers (minA of 1 and +2 w/c follow) Assistive device: Walker-platform   Walk 50 feet activity   Assist Walk 50 feet with 2 turns activity did not occur: Safety/medical concerns (did not perform turns)  Assist level: 2 helpers (minA of 1 and +2 w/c follow) Assistive device: Walker-platform    Walk 150 feet activity   Assist Walk 150 feet activity did not occur: Safety/medical concerns  Assist level: 2 helpers      Walk 10 feet on uneven surface  activity   Assist Walk 10 feet on uneven surfaces activity did not occur: Safety/medical concerns         Wheelchair     Assist Will patient use wheelchair at discharge?: No   Wheelchair activity did not occur: Safety/medical concerns         Wheelchair 50 feet with 2 turns activity    Assist    Wheelchair 50 feet with 2 turns activity did not occur: Safety/medical concerns       Wheelchair 150 feet activity     Assist  Wheelchair 150 feet activity did not occur: Safety/medical concerns       Blood pressure 138/83, pulse 94, temperature 98.9 F (37.2 C), resp. rate 19, height 5\' 10"  (1.778 m), weight (!) 156.9 kg, SpO2 97 %.  Medical Problem List and Plan: 1.  CIM with?  Brachial plexopathy secondary to acute hypoxic respiratory failure secondary to COVID-19 pneumonia  Patient is no longer on isolation             -patient may shower             -ELOS/Goals: extend LOS to 12/3 /supervision/min A.             -team conference today. Extend LOS given pain, ongoing wound care needs 2.  Antithrombotics: -DVT/anticoagulation: Lovenox 80 mg daily per surgery             -antiplatelet therapy: Aspirin 81 mg daily 3. Pain Management: sacrum/LUE  Advil as needed              Oxycodone as needed             11/5: pain improved with 400mg  tid of gabapentin  11/6-9: stable  11/12: continue gabapentin, kpad   -dilaudid for more severe pain   -increased dilaudid to 4mg  30 min prior to wound care   11/23: increase oxycontin to 20mg  q12 to decrease dependence on PRN's 4. Mood: pt needs regular ego support, falling into some dependent personality patterns             -antipsychotic agents: N/A 5. Neuropsych: This patient is capable of making decisions on his own behalf. 6. 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 Wound care orders per  surgery/WOC RN   -packed gauzed moistened with Dakin's soln    -PT will examine wound again today given ?increased odor 7. Fluids/Electrolytes/Nutrition: encourage po 8.  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  Patient completed 5-day course Solu-Medrol             Supportive care with range of motion             Recommend NCV/EMG as outpatient.  -increased gabapentin as above 9.  New onset diabetes mellitus, hemoglobin A1c 7.2.  NovoLog 4 units 3 times daily, Levemir 15 units twice daily.               CBG (last 3)  Recent Labs    07/02/20 2043 07/03/20 0724 07/03/20 1120  GLUCAP 105* 136* 98   Controlled 11/23 10.  Hypertension with tachycardia.  Lopressor 50 mg twice daily.    11/22 bp and HR better controlled 11.  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  Dietary follow-up  11/14- BMI down to 49.63 12. Fever, UCX with 100 Klebsiella  -stool sample also positive for salmonella  -chest xray and exam unremarkable  -blood cultures pending but negative so far  -abx narrowed to augmentin which completed 11/11  11/21 cbc wnl  13. Hypokalemia: K+ 3.1 on 11/10: supplement Kdur BID and repeat tomorrow.   -11/11 k+ 3.4   -continue supplementation   11/18 BMET WNL 14.  Constipation  11/22 hard bm this morning   -on miralax and senna-s bid   -added linzess which began 11/23       LOS: 25 days A FACE TO FACE EVALUATION WAS PERFORMED  Ranelle Oyster 07/03/2020, 11:56 AM

## 2020-07-03 NOTE — Progress Notes (Signed)
Sacral wound dressing completed, moderated amount of serosanguinous drainage from wound site, Foul odor  to wound area, patient verbalize extreme Pain to site, medicated per orders. Reassurance and repostioning provided, monitor and assisted

## 2020-07-04 ENCOUNTER — Inpatient Hospital Stay (HOSPITAL_COMMUNITY): Payer: BC Managed Care – PPO

## 2020-07-04 ENCOUNTER — Inpatient Hospital Stay (HOSPITAL_COMMUNITY): Payer: BC Managed Care – PPO | Admitting: *Deleted

## 2020-07-04 LAB — GLUCOSE, CAPILLARY
Glucose-Capillary: 104 mg/dL — ABNORMAL HIGH (ref 70–99)
Glucose-Capillary: 97 mg/dL (ref 70–99)
Glucose-Capillary: 139 mg/dL — ABNORMAL HIGH (ref 70–99)
Glucose-Capillary: 110 mg/dL — ABNORMAL HIGH (ref 70–99)

## 2020-07-04 MED ORDER — HYDROMORPHONE HCL 2 MG PO TABS
2.0000 mg | ORAL_TABLET | Freq: Two times a day (BID) | ORAL | Status: DC | PRN
  Administered 2020-07-04 – 2020-07-09 (×7): 2 mg via ORAL
  Filled 2020-07-04 (×11): qty 1

## 2020-07-04 MED ORDER — SORBITOL 70 % SOLN
30.0000 mL | Freq: Every day | Status: DC | PRN
  Administered 2020-07-04 – 2020-07-07 (×3): 30 mL via ORAL
  Filled 2020-07-04 (×4): qty 30

## 2020-07-04 NOTE — Progress Notes (Signed)
Occupational Therapy Session Note  Patient Details  Name: Collin Brown MRN: [id number] Date of Birth: 05/02/1986  Today's Date: 07/04/2020 OT Individual Time: 0700-0800 OT Individual Time Calculation (min): 60 min    Short Term Goals: Week 1:  OT Short Term Goal 1 (Week 1): Pt will complete sit<>stand with mod assist +2 in preperation for LB ADLs OT Short Term Goal 1 - Progress (Week 1): Met OT Short Term Goal 2 (Week 1): Pt will achieve full PROM elbow flexion with minimal to no pain. OT Short Term Goal 2 - Progress (Week 1): Progressing toward goal OT Short Term Goal 3 (Week 1): Pt will complete UB dressing with mod assist. OT Short Term Goal 3 - Progress (Week 1): Met OT Short Term Goal 4 (Week 1): Pt will complete UB bathing using long handled sponge as needed with min assist. OT Short Term Goal 4 - Progress (Week 1): Met  Skilled Therapeutic Interventions/Progress Updates:    1:1. Pt received in bed agreeable to OT with increased pain in sacrum with EOB positioning but resting in sidelying intermittently throughout session to improve EOB participation. OT edu re sidelying on L intermittently throughout the day for 5 min intervals to give R skin a chance to improve on hip as minor redness/indentation noted from sheets. Pt supine>sitting EOB with MIN A for trunk elevation. Pt completes EOB bathing with A to wash RUE and A to position LUE to gently wash L armpit. Pt dons shirt with S and min VC for pushing L sleeve over L shoulder. Break in sidelying in prep for more EOB sitting for LB dressing. Pants doffed sit to stand with MIN A for power up. MIN A for pants up and down in standing with PFRW. Reacher utilized for doffing/donning pants over feet. Exited session with pt seated in bed, exit alarm on and call light in reach  Therapy Documentation Precautions:  Precautions Precautions: Fall Precaution Comments: flaccid LUE (protect from subluxation); sling when OOB for comfort; skin  integrity (sacral wound); monitor vitals Required Braces or Orthoses: Sling Restrictions Weight Bearing Restrictions: No General:   Vital Signs: Therapy Vitals Temp: 97.7 F (36.5 C) Temp Source: Oral Pulse Rate: 93 Resp: 19 BP: (!) 126/106 Patient Position (if appropriate): Lying Oxygen Therapy SpO2: 94 % O2 Device: Room Air Pain: Pain Assessment Pain Scale: 0-10 Pain Score: Asleep ADL: ADL Upper Body Bathing: Maximal assistance Where Assessed-Upper Body Bathing: Edge of bed Lower Body Bathing: Other (comment) (+2 assist) Where Assessed-Lower Body Bathing: Edge of bed, Bed level (attempted to wash buttocks but quickly fatigued; therefore completed at bed level sidelying) Upper Body Dressing: Maximal assistance Where Assessed-Upper Body Dressing: Edge of bed Lower Body Dressing: Other (Comment) (attempted in standing but fatigued; bed level +2 total assist) Where Assessed-Lower Body Dressing: Edge of bed, Bed level Toileting: Dependent Where Assessed-Toileting: Bed level (urinal and incontinent of bowel) Vision   Perception    Praxis   Exercises:   Other Treatments:     Therapy/Group: Individual Therapy  Tonny Branch 07/04/2020, 7:25 AM

## 2020-07-04 NOTE — Progress Notes (Signed)
Physical Therapy Session Note  Patient Details  Name:  Collin Brown MRN: [id number] Date of Birth: 07/16/1986  Today's Date: 07/04/2020 PT Individual Time: 1300-1415 PT Individual Time Calculation (min): 75 min   Short Term Goals: Week 4:  PT Short Term Goal 1 (Week 4): Patient will perform bed mobility with CGA without use of hospital bed functions. PT Short Term Goal 2 (Week 4): Patient will perform basic transfers with min A consistently. PT Short Term Goal 3 (Week 4): Patient will ambulate 100 ft using LRAD with min A. PT Short Term Goal 4 (Week 4): Patient will initiate stair training.  Skilled Therapeutic Interventions/Progress Updates:     Patient in bed upon PT arrival. Patient alert and agreeable to PT session. Patient reported 7/10 sacral pain during session, RN made aware. PT provided repositioning, rest breaks, and distraction as pain interventions throughout session. [name], RT, present for co-treat throughout session.   Therapeutic Activity: Bed Mobility: Patient performed supine to/from sit with supervision with bed max inflated for increased leverage. Provided verbal cues for full roll to his R to improved elbow positioning prior to pushing up to sitting. Donned L sling and shoes with total A with patient sitting EOB with supervision for sitting balance Transfers: Patient performed sit to/from stand x4 and stand pivot x1 using L PFRW with CGA for safety/balance. Provided verbal cues for breath support for pain management during stand.  Gait Training:  Patient ambulated 67 feet and 93 feet using L PFRW with CGA and w/c follow for safety. Ambulated with narrow BOS and increased foot supination in stance R>L, decreased gait speed, step, height, and step length, and increased work of breathing after each trial, SPO2 >96%, HR 103-109. Provided verbal cues for increased BOS and paced breathing throughout. Patient ascended/descended 3-3" steps using B rails with CGA +2 for safety.  Performed step-to gait pattern leading with R while ascending and L while descending. Provided cues for technique and sequencing. Patient with increased light headedness after and required prolonged rest break to recover.  After the stairs, he attempted another gait trial, ambulated ~5 ft and had to stop due to reporting tunneling vision that completely resolved in sitting. Vitals: BP 119/82, HR 103, SPO2 98% on RA.   Provided education of anxiety with initiating stair training and signs/symptoms of anxiety. Patient appreciated education and participated in diaphragmatic breathing exercises for relaxation. Reported resolution of symptoms after, but stating feeling increased fatigue.   Patient in bed in R side-lying at end of session with breaks locked and all needs within reach.    Therapy Documentation Precautions:  Precautions Precautions: Fall Precaution Comments: flaccid LUE (protect from subluxation); sling when OOB for comfort; skin integrity (sacral wound); monitor vitals Required Braces or Orthoses: Sling Restrictions Weight Bearing Restrictions: No   Therapy/Group: Individual Therapy  [name] PT, DPT  07/04/2020, 4:16 PM

## 2020-07-04 NOTE — Progress Notes (Addendum)
Physical Therapy Wound Treatment Patient Details  Name: Collin Brown MRN: [id number] Date of Birth: 1985/08/27  Today's Date: 07/04/2020 PT Individual Time: 1000-1030 PT Individual Time Calculation (min): 30 min   Subjective  Subjective: Pt reports pain not as well controlled as yesterday but is acceptable.  Patient and Family Stated Goals: Heal wound Date of Onset:  (Unknown) Prior Treatments: dressing changes  Pain Score:  5/10 burning; pt was premedicated.  Wound Assessment  Pressure Injury 06/08/20 Coccyx Unstageable - Full thickness tissue loss in which the base of the injury is covered by slough (yellow, tan, gray, green or brown) and/or eschar (tan, brown or black) in the wound bed. yellow necrotic tissue in the crease o (Active)  Dressing Type ABD;Barrier Film (skin prep);Gauze (Comment);Moist to dry 07/04/20 1041  Dressing Changed;Clean;Dry;Intact 07/04/20 1041  Dressing Change Frequency Daily 07/04/20 1041  State of Healing Early/partial granulation 07/04/20 1041  Site / Wound Assessment Pink;Red;Yellow;Painful 07/04/20 1041  % Wound base Red or Granulating 60% 07/04/20 1041  % Wound base Yellow/Fibrinous Exudate 30% 07/04/20 1041  % Wound base Black/Eschar 0% 07/04/20 1041  % Wound base Other/Granulation Tissue (Comment) 10% (white connective tissue) 07/04/20 1041  Peri-wound Assessment Intact;Pink 07/04/20 1041  Wound Length (cm) 4 cm 07/03/20 2100  Wound Width (cm) 3.5 cm 07/03/20 2100  Wound Depth (cm) 8 cm 07/03/20 2100  Wound Surface Area (cm^2) 14 cm^2 07/03/20 2100  Wound Volume (cm^3) 112 cm^3 07/03/20 2100  Tunneling (cm) 3 at 5:00; 3.3 at 12:00 06/30/20 1000  Undermining (cm) 4 cm at 12:00; 4.5 cm at 3:00 06/25/20 1026  Margins Unattached edges (unapproximated) 07/04/20 1041  Drainage Amount Moderate 07/04/20 1041  Drainage Description Serosanguineous 07/04/20 1041  Treatment Debridement (Selective);Hydrotherapy (Pulse lavage);Other (Comment)- packed with  Dakins soaked 3" kling 07/04/20 1041     Hydrotherapy Pulsed lavage therapy - wound location: sacrum Pulsed Lavage with Suction (psi): 10 psi Pulsed Lavage with Suction - Normal Saline Used: 1000 mL Pulsed Lavage Tip: Tip with splash shield Selective Debridement Selective Debridement - Location: sacrum Selective Debridement - Tools Used: Forceps;Scissors Selective Debridement - Tissue Removed: Yellow unviable tissue   Wound Assessment and Plan  Wound Therapy - Assess/Plan/Recommendations Wound Therapy - Clinical Statement: Wound with decreased odor today (but still with odor present).  Able to debride small amount of non-viable yellow tissue from wound base and 5:00 border.  Difficult to further debride base of wound due to depth and small opening.  Wound will continue to benefit from hydrotherapy for selective removal of unviable tissue, to decrease bioburden, and promote wound bed healing.  Wound Therapy - Functional Problem List: decreased mobility Factors Delaying/Impairing Wound Healing: Immobility Hydrotherapy Plan: Debridement;Patient/family education;Pulsatile lavage with suction;Dressing change Wound Therapy - Frequency: 6X / week Wound Therapy - Follow Up Recommendations: Home health RN Wound Plan: see above Notified nursing that therapy will not be doing hydrotherapy tomorrow and nursing to complete dressing change.  PT will resume hydro on Friday.  Wound Therapy Goals- Improve the function of patient's integumentary system by progressing the wound(s) through the phases of wound healing (inflammation - proliferation - remodeling) by: Decrease Necrotic Tissue to: 20 Decrease Necrotic Tissue - Progress: Progressing toward goal Increase Granulation Tissue to: 80 Increase Granulation Tissue - Progress: Progressing toward goal Goals/treatment plan/discharge plan were made with and agreed upon by patient/family: Yes Time For Goal Achievement: 7 days Wound Therapy - Potential for  Goals: Good  Goals will be updated until maximal potential achieved or discharge  criteria met.  Discharge criteria: when goals achieved, discharge from hospital, MD decision/surgical intervention, no progress towards goals, refusal/missing three consecutive treatments without notification or medical reason.  GP    Abran Richard, PT Acute Rehab Services Pager [phone] [facility] [place] 07/04/2020, 11:11 AM

## 2020-07-04 NOTE — Progress Notes (Signed)
Dr. Riley Kill notified of foul odor from wound to patient sacrum and patient status. Vitals WNL. No new orders noted. Hydrotherapy continues daily and dressing changes.

## 2020-07-04 NOTE — Progress Notes (Signed)
[facility] PHYSICAL MEDICINE & REHABILITATION PROGRESS NOTE   Subjective/Complaints: Had a much better night in respect to pain. Had a good session of OT this morning. Discussed his pain medication usage this morning. +flatus, no bm since 11/22  ROS: Patient denies fever, rash, sore throat, blurred vision, nausea, vomiting, diarrhea, cough, shortness of breath or chest pain,   headache, or mood change.    No results found. No results for input(s): WBC, HGB, HCT, PLT in the last 72 hours. No results for input(s): NA, K, CL, CO2, GLUCOSE, BUN, CREATININE, CALCIUM in the last 72 hours.  Intake/Output Summary (Last 24 hours) at 07/04/2020 0920 Last data filed at 07/04/2020 0700 Gross per 24 hour  Intake 800 ml  Output 650 ml  Net 150 ml     Pressure Injury 06/08/20 Coccyx Unstageable - Full thickness tissue loss in which the base of the injury is covered by slough (yellow, tan, gray, green or brown) and/or eschar (tan, brown or black) in the wound bed. yellow necrotic tissue in the crease o (Active)  06/08/20 1830 (with Lizabeth Leyden RN)  Location: Coccyx  Location Orientation:   Staging: Unstageable - Full thickness tissue loss in which the base of the injury is covered by slough (yellow, tan, gray, green or brown) and/or eschar (tan, brown or black) in the wound bed.  Wound Description (Comments): yellow necrotic tissue in the crease originally put in as a nonpressure wound  Present on Admission: Yes    Physical Exam: Vital Signs Blood pressure (!) 126/106, pulse 93, temperature 97.7 F (36.5 C), temperature source Oral, resp. rate 19, height 5\' 10"  (1.778 m), weight (!) 156.9 kg, SpO2 94 %.   Constitutional: No distress . Vital signs reviewed. obese HEENT: EOMI, oral membranes moist Neck: supple Cardiovascular: RRR without murmur. No JVD    Respiratory/Chest: CTA Bilaterally without wheezes or rales. Normal effort    GI/Abdomen: BS +, non-tender, non-distended Ext: no  clubbing, cyanosis, or edema Psych: pleasant and cooperative, more relaxed today Skin: sacral wound with increased granulation tissue/area now open after I&D. Area remains tender, bleeding, minimal odor today Neuro: AOx3 Cranial nerves 2-12 are intact. DTR's 1+.  Fine motor coordination is intact. No tremors. LUE still limited motor wise especially proximally.  Musculoskeletal: Full ROM, No pain with AROM or PROM in the neck, trunk, or LE's.  LUE tolerating PROM, repositioning much better. .    Assessment/Plan: 1. 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  Physiatrist is providing close team supervision and 24 hour management of active medical problems listed below.  Physiatrist and rehab team continue to assess barriers to discharge/monitor patient progress toward functional and medical goals  Care Tool:  Bathing    Body parts bathed by patient: Left arm, Chest, Abdomen, Face   Body parts bathed by helper: Right arm     Bathing assist Assist Level: Minimal Assistance - Patient > 75%     Upper Body Dressing/Undressing Upper body dressing   What is the patient wearing?: Hospital gown only    Upper body assist Assist Level: Minimal Assistance - Patient > 75%    Lower Body Dressing/Undressing Lower body dressing      What is the patient wearing?: Pants     Lower body assist Assist for lower body dressing: Maximal Assistance - Patient 25 - 49%     Toileting Toileting    Toileting assist Assist  for toileting: Moderate Assistance - Patient 50 - 74%     Transfers Chair/bed transfer  Transfers assist  Chair/bed transfer activity did not occur: Safety/medical concerns  Chair/bed transfer assist level: Minimal Assistance - Patient > 75% Chair/bed transfer assistive device: Walker (L PFRW)   Locomotion Ambulation   Ambulation assist   Ambulation  activity did not occur: Safety/medical concerns  Assist level: Minimal Assistance - Patient > 75% Assistive device: Walker-rolling Max distance: 50 ft   Walk 10 feet activity   Assist  Walk 10 feet activity did not occur: Safety/medical concerns  Assist level: Minimal Assistance - Patient > 75% Assistive device: Walker-rolling   Walk 50 feet activity   Assist Walk 50 feet with 2 turns activity did not occur: Safety/medical concerns (did not perform turns)  Assist level: Minimal Assistance - Patient > 75% Assistive device: Walker-rolling    Walk 150 feet activity   Assist Walk 150 feet activity did not occur: Safety/medical concerns  Assist level: 2 helpers      Walk 10 feet on uneven surface  activity   Assist Walk 10 feet on uneven surfaces activity did not occur: Safety/medical concerns         Wheelchair     Assist Will patient use wheelchair at discharge?: No   Wheelchair activity did not occur: Safety/medical concerns         Wheelchair 50 feet with 2 turns activity    Assist    Wheelchair 50 feet with 2 turns activity did not occur: Safety/medical concerns       Wheelchair 150 feet activity     Assist  Wheelchair 150 feet activity did not occur: Safety/medical concerns       Blood pressure (!) 126/106, pulse 93, temperature 97.7 F (36.5 C), temperature source Oral, resp. rate 19, height 5\' 10"  (1.778 m), weight (!) 156.9 kg, SpO2 94 %.  Medical Problem List and Plan: 1.  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              -discussed with Matt that he needs to take advantage of the extra time we've given him 2.  Antithrombotics: -DVT/anticoagulation: Lovenox 80 mg daily per surgery             -antiplatelet therapy: Aspirin  81 mg daily 3. Pain Management: sacrum/LUE  11/12: continue gabapentin, kpad   -dilaudid for more severe pain   -increased dilaudid to 4mg  30 min prior to wound care  11/23: increased oxycontin to 20mg  q12 to decrease dependence on PRN's  11/24: pain seems better. Told him I would prefer that he use oxycodone for his breakthrough pain and the dilaudid only for severe, "emergency" pain. He has the scheduled dilaudid prior to  Hydrotherapy which we'll keep.    -reduced prn dilaudid to 2mg  q12 prn   -pt in agreement with plan 4. Mood: pt needs regular ego support, falling into some dependent personality patterns             -antipsychotic agents: N/A 5. Neuropsych: This patient is capable of making decisions on his own behalf. 6. 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 Wound care orders per surgery/WOC RN   -dressing: packed gauzed moistened with Dakin's soln    -continue daily hydrotherapy 7. Fluids/Electrolytes/Nutrition: encourage po 8.  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  Patient completed 5-day course Solu-Medrol             Supportive care with range of motion             Recommend NCV/EMG as outpatient.  -gabapentin as above 9.  New onset diabetes mellitus, hemoglobin A1c 7.2.  NovoLog 4 units 3 times daily, Levemir 15 units twice daily.               CBG (last 3)  Recent Labs    07/03/20 1646 07/03/20 2115 07/04/20 0611  GLUCAP 106* 120* 104*   Controlled 11/24 10.  Hypertension with tachycardia.  Lopressor 50 mg twice daily.    11/22 bp and HR better controlled 11.  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  Dietary follow-up  11/14- BMI down to 49.63 12. Fever, UCX with 100 Klebsiella  -stool sample also positive for salmonella  -chest xray and exam unremarkable  -blood cultures pending but negative so far  -abx narrowed to augmentin  which completed 11/11  11/21 cbc wnl  13. Hypokalemia: K+ 3.1 on 11/10: supplement Kdur BID and repeat tomorrow.   -11/11 k+ 3.4   -continue supplementation   11/18 BMET WNL 14. Constipation  11/22 hard bm this morning   -on miralax and senna-s bid   -added linzess which began 11/23    11/24 -PRN today if no bm, although pt feels that he will have bm today     LOS: 26 days A FACE TO FACE EVALUATION WAS PERFORMED  Ranelle Oyster 07/04/2020, 9:20 AM

## 2020-07-05 ENCOUNTER — Encounter (HOSPITAL_COMMUNITY): Payer: Self-pay | Admitting: Physical Medicine & Rehabilitation

## 2020-07-05 DIAGNOSIS — E876 Hypokalemia: Secondary | ICD-10-CM

## 2020-07-05 DIAGNOSIS — D62 Acute posthemorrhagic anemia: Secondary | ICD-10-CM

## 2020-07-05 DIAGNOSIS — E119 Type 2 diabetes mellitus without complications: Secondary | ICD-10-CM

## 2020-07-05 LAB — GLUCOSE, CAPILLARY
Glucose-Capillary: 110 mg/dL — ABNORMAL HIGH (ref 70–99)
Glucose-Capillary: 125 mg/dL — ABNORMAL HIGH (ref 70–99)
Glucose-Capillary: 133 mg/dL — ABNORMAL HIGH (ref 70–99)
Glucose-Capillary: 126 mg/dL — ABNORMAL HIGH (ref 70–99)

## 2020-07-05 NOTE — Progress Notes (Signed)
[facility] PHYSICAL MEDICINE & REHABILITATION PROGRESS NOTE   Subjective/Complaints: Patient seen laying in bed this morning.  He states he slept fairly overnight due to positioning and bottom pain.  He states he is looking forward to his mother coming in today and bringing him food.  ROS: Denies CP, SOB, N/V/D   No results found. No results for input(s): WBC, HGB, HCT, PLT in the last 72 hours. No results for input(s): NA, K, CL, CO2, GLUCOSE, BUN, CREATININE, CALCIUM in the last 72 hours.  Intake/Output Summary (Last 24 hours) at 07/05/2020 1042 Last data filed at 07/05/2020 5993 Gross per 24 hour  Intake 660 ml  Output --  Net 660 ml     Pressure Injury 06/08/20 Coccyx Unstageable - Full thickness tissue loss in which the base of the injury is covered by slough (yellow, tan, gray, green or brown) and/or eschar (tan, brown or black) in the wound bed. yellow necrotic tissue in the crease o (Active)  06/08/20 1830 (with Lizabeth Leyden RN)  Location: Coccyx  Location Orientation:   Staging: Unstageable - Full thickness tissue loss in which the base of the injury is covered by slough (yellow, tan, gray, green or brown) and/or eschar (tan, brown or black) in the wound bed.  Wound Description (Comments): yellow necrotic tissue in the crease originally put in as a nonpressure wound  Present on Admission: Yes    Physical Exam: Vital Signs Blood pressure (!) 151/83, pulse (!) 102, temperature 98.4 F (36.9 C), resp. rate 19, height 5\' 10"  (1.778 m), weight (!) 156.9 kg, SpO2 96 %. Constitutional: No distress . Vital signs reviewed. HENT: Normocephalic.  Atraumatic. Eyes: EOMI. No discharge. Cardiovascular: No JVD.  RRR. Respiratory: Normal effort.  No stridor.  Bilateral clear to auscultation. GI: Non-distended.  BS +. Skin: Warm and dry.  Coccyx wound not examined today. Psych: Normal mood.  Normal behavior. Musc: No edema in extremities.  No tenderness in extremities. Neuro:  Alert Motor: LUE: 1+/5 proximal distal RUE: 4/5 proximal distal Bilateral lower extremities: 4-4+/5 proximal to distal   Assessment/Plan: 1. 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  Physiatrist is providing close team supervision and 24 hour management of active medical problems listed below.  Physiatrist and rehab team continue to assess barriers to discharge/monitor patient progress toward functional and medical goals  Care Tool:  Bathing    Body parts bathed by patient: Left arm, Chest, Abdomen, Face   Body parts bathed by helper: Right arm     Bathing assist Assist Level: Minimal Assistance - Patient > 75%     Upper Body Dressing/Undressing Upper body dressing   What is the patient wearing?: Hospital gown only    Upper body assist Assist Level: Minimal Assistance - Patient > 75%    Lower Body Dressing/Undressing Lower body dressing      What is the patient wearing?: Pants     Lower body assist Assist for lower body dressing: Maximal Assistance - Patient 25 - 49%     Toileting Toileting    Toileting assist Assist for toileting: Moderate Assistance - Patient 50 - 74%     Transfers Chair/bed transfer  Transfers assist  Chair/bed transfer activity did not occur: Safety/medical concerns  Chair/bed transfer assist level: Contact Guard/Touching assist Chair/bed transfer assistive device: Marland Kitchen   Ambulation assist   Ambulation activity did not occur: Safety/medical concerns  Assist level: Contact Guard/Touching assist Assistive device: Walker-platform Max distance: 93 ft   Walk 10 feet activity   Assist  Walk 10 feet activity did not occur: Safety/medical concerns  Assist level: Contact Guard/Touching assist Assistive device: Walker-platform   Walk 50 feet activity   Assist Walk 50 feet with 2  turns activity did not occur: Safety/medical concerns (did not perform turns)  Assist level: Contact Guard/Touching assist Assistive device: Walker-platform    Walk 150 feet activity   Assist Walk 150 feet activity did not occur: Safety/medical concerns  Assist level: 2 helpers      Walk 10 feet on uneven surface  activity   Assist Walk 10 feet on uneven surfaces activity did not occur: Safety/medical concerns         Wheelchair     Assist Will patient use wheelchair at discharge?: No   Wheelchair activity did not occur: Safety/medical concerns         Wheelchair 50 feet with 2 turns activity    Assist    Wheelchair 50 feet with 2 turns activity did not occur: Safety/medical concerns       Wheelchair 150 feet activity     Assist  Wheelchair 150 feet activity did not occur: Safety/medical concerns       Blood pressure (!) 151/83, pulse (!) 102, temperature 98.4 F (36.9 C), resp. rate 19, height 5\' 10"  (1.778 m), weight (!) 156.9 kg, SpO2 96 %.  Medical Problem List and Plan: 1.  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  Antithrombotics: -DVT/anticoagulation: Lovenox 80 mg daily per surgery             -antiplatelet therapy: Aspirin 81 mg daily 3. Pain Management: sacrum/LUE  11/12: continue gabapentin, kpad   -dilaudid for more severe pain  Oxycontin to 20mg  q12 to decrease dependence on PRN's    -reduced prn dilaudid to 2mg  q12 prn  Relatively controlled with meds on 11/25 4. Mood: pt needs regular ego support, falling into some dependent personality patterns             -antipsychotic agents: N/A 5. Neuropsych: This patient is capable of making decisions on his own behalf. 6. 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 Wound care orders per surgery/WOC RN   -dressing: packed gauzed moistened with Dakin's soln    -continue daily hydrotherapy 7. Fluids/Electrolytes/Nutrition: encourage po 8.  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  Patient completed 5-day course Solu-Medrol             Supportive care with range of motion             Recommend NCV/EMG as outpatient.  -gabapentin as above  WHO ordered 9.  New onset diabetes mellitus, hemoglobin A1c 7.2.  NovoLog 4 units 3 times daily, Levemir 15 units twice daily.               CBG (last 3)  Recent Labs    07/04/20 1631 07/04/20 2046 07/05/20 0626  GLUCAP 139* 110* 110*   Relatively controlled on 11/25 10.  Hypertension with tachycardia.  Lopressor 50 mg twice daily.    Slightly labile/borderline elevated on 11/25 11.  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  Dietary follow-up  11/14- BMI down to 49.63 12. Fever, UCX  with 100 Klebsiella: Resolved  -stool sample also positive for salmonella  -chest xray and exam unremarkable  -blood cultures negative  -abx narrowed to augmentin which completed 11/11 13. Hypokalemia:   4.0 on 11/15, labs ordered for tomorrow  Continue supplementation  14. Constipation  On miralax and senna-s bid  Linzess started on 11/23 15.  Acute blood loss anemia  Hemoglobin 11.5 on 11/21  Continue to monitor    LOS: 27 days A FACE TO FACE EVALUATION WAS PERFORMED  Drayke Grabel Karis Juba 07/05/2020, 10:42 AM

## 2020-07-06 ENCOUNTER — Inpatient Hospital Stay (HOSPITAL_COMMUNITY): Payer: BC Managed Care – PPO

## 2020-07-06 DIAGNOSIS — K5903 Drug induced constipation: Secondary | ICD-10-CM

## 2020-07-06 LAB — BASIC METABOLIC PANEL
Anion gap: 13 (ref 5–15)
BUN: 9 mg/dL (ref 6–20)
CO2: 25 mmol/L (ref 22–32)
Calcium: 9.6 mg/dL (ref 8.9–10.3)
Chloride: 99 mmol/L (ref 98–111)
Creatinine, Ser: 0.56 mg/dL — ABNORMAL LOW (ref 0.61–1.24)
GFR, Estimated: >60 mL/min (ref >60)
Glucose, Bld: 104 mg/dL — ABNORMAL HIGH (ref 70–99)
Potassium: 4 mmol/L (ref 3.5–5.1)
Sodium: 137 mmol/L (ref 135–145)

## 2020-07-06 LAB — GLUCOSE, CAPILLARY
Glucose-Capillary: 104 mg/dL — ABNORMAL HIGH (ref 70–99)
Glucose-Capillary: 94 mg/dL (ref 70–99)
Glucose-Capillary: 113 mg/dL — ABNORMAL HIGH (ref 70–99)
Glucose-Capillary: 130 mg/dL — ABNORMAL HIGH (ref 70–99)

## 2020-07-06 MED ORDER — JUVEN PO PACK
1.0000 | PACK | Freq: Two times a day (BID) | ORAL | Status: DC
  Administered 2020-07-06 – 2020-07-13 (×12): 1 via ORAL
  Filled 2020-07-06 (×11): qty 1

## 2020-07-06 MED ORDER — COLLAGENASE 250 UNIT/GM EX OINT
TOPICAL_OINTMENT | Freq: Every day | CUTANEOUS | Status: DC
  Administered 2020-07-06: 1 via TOPICAL
  Filled 2020-07-06: qty 30

## 2020-07-06 MED ORDER — LINACLOTIDE 145 MCG PO CAPS
290.0000 ug | ORAL_CAPSULE | Freq: Every day | ORAL | Status: DC
  Administered 2020-07-07 – 2020-07-08 (×2): 290 ug via ORAL
  Filled 2020-07-06 (×3): qty 2

## 2020-07-06 MED ORDER — PROSOURCE PLUS PO LIQD
30.0000 mL | Freq: Two times a day (BID) | ORAL | Status: DC
  Administered 2020-07-06 – 2020-07-13 (×13): 30 mL via ORAL
  Filled 2020-07-06 (×13): qty 30

## 2020-07-06 NOTE — Progress Notes (Signed)
Physical Therapy Wound Treatment Patient Details  Name: Collin Brown MRN: [id number] Date of Birth: 1986-04-23  Today's Date: 07/06/2020 PT Individual Time: 1000-1033 PT Individual Time Calculation (min): 33 min   Subjective  Subjective: Pt agreeable to wound care Patient and Family Stated Goals: Heal wound Date of Onset:  (Unknown) Prior Treatments: dressing changes  Pain Score: Pt was premedicated and initially with tolerable pain. However, after hydrotherapy pain began to increase and only able to tolerate small amount of debridement.  Pt did not rate pain but with grimacing and moaning during packing.  Eased after wound care.   Wound Assessment  Pressure Injury 06/08/20 Coccyx Unstageable - Full thickness tissue loss in which the base of the injury is covered by slough (yellow, tan, gray, green or brown) and/or eschar (tan, brown or black) in the wound bed. yellow necrotic tissue in the crease o (Active)  Wound Image   07/06/20 1041  Dressing Type ABD;Barrier Film (skin prep);Gauze (saline soaked);Moist to dry 07/06/20 1041  Dressing Changed;Clean;Dry;Intact 07/06/20 1041  Dressing Change Frequency Daily 07/06/20 1041  State of Healing Early/partial granulation 07/06/20 1041  Site / Wound Assessment Pink;Yellow;Red 07/06/20 1041  % Wound base Red or Granulating 65% 07/06/20 1041  % Wound base Yellow/Fibrinous Exudate 25% 07/06/20 1041  % Wound base Black/Eschar 0% 07/06/20 1041  % Wound base Other/Granulation Tissue (Comment) 10% (white connective) 07/06/20 1041  Peri-wound Assessment Intact;Pink 07/04/20 1041  Wound Length (cm) 3.4 cm 07/06/20 1041  Wound Width (cm) 3.5 cm 07/06/20 1041  Wound Depth (cm) 7.7 cm 07/06/20 1041  Wound Surface Area (cm^2) 11.9 cm^2 07/06/20 1041  Wound Volume (cm^3) 91.63 cm^3 07/06/20 1041  Tunneling (cm) 2.8 at 5:00, 3 at 12:00 07/06/20 1041  Undermining (cm) - 07/06/20 1041  Margins Unattached edges (unapproximated) 07/06/20 1041  Drainage  Amount Moderate 07/06/20 1041  Drainage Description Serosanguineous 07/06/20 1041  Treatment Debridement (Selective);Hydrotherapy (Pulse lavage);Packing (Saline gauze); Santyl applied to wound bed prior to applying dressing.  07/06/20 1041      Hydrotherapy Pulsed lavage therapy - wound location: sacrum Pulsed Lavage with Suction (psi): 10 psi Pulsed Lavage with Suction - Normal Saline Used: 1000 mL Pulsed Lavage Tip: Tip with splash shield Selective Debridement Selective Debridement - Location: sacrum Selective Debridement - Tools Used: Forceps;Scissors Selective Debridement - Tissue Removed: Yellow unviable tissue   Wound Assessment and Plan  Wound Therapy - Assess/Plan/Recommendations Wound Therapy - Clinical Statement:   Wound with significantly decreased necrosis at 5:00 border and with decreased odor.  Continued to be able to debride small amount of yellow non-viable tissue at wound base.  Pt with increasing pain that limited amount of debridement despite premedication.  Have completed 72 additional hours of Dakins , so switched back to saline gauze and santyl.  Wound will continue to benefit from hydrotherapy for selective removal of unviable tissue, to decrease bioburden, and promote wound bed healing.  Wound Therapy - Functional Problem List: decreased mobility Factors Delaying/Impairing Wound Healing: Immobility Hydrotherapy Plan: Debridement;Patient/family education;Pulsatile lavage with suction;Dressing change Wound Therapy - Frequency: 6X / week Wound Therapy - Follow Up Recommendations: Home health RN Wound Plan: see above  Wound Therapy Goals- Improve the function of patient's integumentary system by progressing the wound(s) through the phases of wound healing (inflammation - proliferation - remodeling) by: Decrease Necrotic Tissue to: 20 Decrease Necrotic Tissue - Progress: Progressing toward goal Increase Granulation Tissue to: 80 Increase Granulation Tissue -  Progress: Progressing toward goal Goals/treatment plan/discharge plan were made  with and agreed upon by patient/family: Yes Time For Goal Achievement: 7 days Wound Therapy - Potential for Goals: Good  Goals will be updated until maximal potential achieved or discharge criteria met.  Discharge criteria: when goals achieved, discharge from hospital, MD decision/surgical intervention, no progress towards goals, refusal/missing three consecutive treatments without notification or medical reason.  GP   Abran Richard, PT Acute Rehab Services Pager [phone] [facility] [place] 07/06/2020, 10:51 AM

## 2020-07-06 NOTE — Progress Notes (Signed)
Pt was educated on need for dressing change, he stated he wanted to wait for hydrotherapy since his dressing was changed late in the day. Pt allowed nurse to reinforce ABD pad. No other concerns to report.

## 2020-07-06 NOTE — Progress Notes (Signed)
Occupational Therapy Session Note  Patient Details  Name: Collin Brown MRN: [id number] Date of Birth: [birth year]  Today's Date: 07/06/2020 OT Individual Time: 8768-1157 OT Individual Time Calculation (min): 55 min    Short Term Goals: Week 1:  OT Short Term Goal 1 (Week 1): Pt will complete sit<>stand with mod assist +2 in preperation for LB ADLs OT Short Term Goal 1 - Progress (Week 1): Met OT Short Term Goal 2 (Week 1): Pt will achieve full PROM elbow flexion with minimal to no pain. OT Short Term Goal 2 - Progress (Week 1): Progressing toward goal OT Short Term Goal 3 (Week 1): Pt will complete UB dressing with mod assist. OT Short Term Goal 3 - Progress (Week 1): Met OT Short Term Goal 4 (Week 1): Pt will complete UB bathing using long handled sponge as needed with min assist. OT Short Term Goal 4 - Progress (Week 1): Met  Skilled Therapeutic Interventions/Progress Updates:    1:1. Pt received bed reporting "worst pain from hydro yet." Pt agreeable to working on LUE in sidelying for part of session then sitting up on  EOB at end of session. With UE ranger pt completes punches forward, arm raises, elbow bends and circles in B directions with VC with VC for slow controlled motions to strengthen against gravity and min faciliation of elbow extenssion. Pt very motivated to watch arm move. Pt sits EOB with CGA to raise trunk. Pt reporting pain "not as bad as I thought." OT educates on not letting expectations limit actions in tx. Pt verbalized understanding. Pt grooms with set up at EOB. Exited session with pt seated in bed, exit alarm on and call light in reach   Therapy Documentation Precautions:  Precautions Precautions: Fall Precaution Comments: flaccid LUE (protect from subluxation); sling when OOB for comfort; skin integrity (sacral wound); monitor vitals Required Braces or Orthoses: Sling Restrictions Weight Bearing Restrictions: No General:   Vital Signs: Therapy  Vitals Temp: 98.2 F (36.8 C) Pulse Rate: 93 Resp: 14 BP: 138/88 Patient Position (if appropriate): Lying Oxygen Therapy SpO2: 95 % O2 Device: Room Air Pain: Pain Assessment Pain Scale: 0-10 Pain Score: 3  ADL: ADL Upper Body Bathing: Maximal assistance Where Assessed-Upper Body Bathing: Edge of bed Lower Body Bathing: Other (comment) (+2 assist) Where Assessed-Lower Body Bathing: Edge of bed, Bed level (attempted to wash buttocks but quickly fatigued; therefore completed at bed level sidelying) Upper Body Dressing: Maximal assistance Where Assessed-Upper Body Dressing: Edge of bed Lower Body Dressing: Other (Comment) (attempted in standing but fatigued; bed level +2 total assist) Where Assessed-Lower Body Dressing: Edge of bed, Bed level Toileting: Dependent Where Assessed-Toileting: Bed level (urinal and incontinent of bowel) Vision   Perception    Praxis   Exercises:   Other Treatments:     Therapy/Group: Individual Therapy  Tonny Branch 07/06/2020, 6:50 AM

## 2020-07-06 NOTE — Progress Notes (Signed)
After further education patient allowed dressing change. No other problems to report.

## 2020-07-06 NOTE — Progress Notes (Signed)
Physical Therapy Note  Patient Details  Name: Collin Brown MRN: [id number] Date of Birth: 1986/04/26 Today's Date: 07/06/2020    Patient lying in bed with RN in the room upon PT entry. Patient reported >10/10 sacral wound pain this morning and appeared flush with visible grimacing. RN aware and providing pain medicine. Reminded patient of breathing and imagery activities for pain control. Patient reports that he is in too much pain to work with PT at this time. Patient missed 60 min of skilled PT due to pain, RN made aware. Will attempt to make-up missed time as able.     Jaanai Salemi L Lailana Shira PT, DPT  07/04/2020, 9:07 AM

## 2020-07-06 NOTE — Progress Notes (Signed)
Patient ID: Collin Brown, male   DOB: [birth year], 34 y.o.   MRN: [id number]   SW ordered pt DME: bariatric 3in1BSC, TTB, w/c with roho cushion, and bariatric RW with Left arm attachment.  SW met with pt in room to inform on above.   Loralee Pacas, MSW, [place] Office: [phone] Cell: [phone] Fax: [phone]

## 2020-07-06 NOTE — Progress Notes (Signed)
Physical Therapy Session Note  Patient Details  Name: Collin Brown MRN: [id number] Date of Birth: 08/18/1985  Today's Date: 07/06/2020 PT Individual Time: 3532-9924 and 1425-1535 PT Individual Time Calculation (min): 55 min   Short Term Goals: Week 4:  PT Short Term Goal 1 (Week 4): Patient will perform bed mobility with CGA without use of hospital bed functions. PT Short Term Goal 2 (Week 4): Patient will perform basic transfers with min A consistently. PT Short Term Goal 3 (Week 4): Patient will ambulate 100 ft using LRAD with min A. PT Short Term Goal 4 (Week 4): Patient will initiate stair training.  Skilled Therapeutic Interventions/Progress Updates:     Session 1: Patient in bed upon PT arrival. Patient alert and agreeable to PT session. Patient reported 10/10 sacral wound pain during session, RN provided pain medicine at beginning of session, reduced to 7/10 during session, except in sitting. PT provided repositioning, rest breaks, and distraction as pain interventions throughout session.   Therapeutic Activity: MD rounded during session, assessed patient's sacral wound. PT replaced external bandage after inspection by MD. Collin Brown patient's pants then donned B thigh high TED hose and pants with total A bed level for pain management. Bed Mobility: Patient performed supine to/from sit with min A for trunk and lower extremity management in a flat bed without use of bed rails with the air mattress max inflated. Provided verbal cues for rolling on R side and using elbow to push up to sitting. Once in sitting patient in 10/10 pain and required immediate return to lying due to pain.   Neuromuscular Re-ed: Patient performed the following L upper extremity motor control activities in R side-lying: -finger flexion/extension with gross grip x3 sec hold x10 -elbow flexion/extension AAROM x10 -wrist flexion/extension AAROM x10 -forward punch with min/mod A x10 -shoulder ER/IR in scapular  plane AAROM x10 -shoulder flexion/extension AAROM x10  Patient in R side-lying positioned with pilows at end of session with breaks locked and all needs within reach.   Session 2: Patient in bed upon PT arrival. Patient alert and agreeable to PT session. Patient reported 6-9/10 sacral wound pain during session, RN made aware. PT provided repositioning, rest breaks, and distraction as pain interventions throughout session.   Therapeutic Activity: Bed Mobility: Patient performed supine to/from sit with min A-CGA on max inflated air mattress and on the ADL bed. Provided verbal cues for completing R roll to set R elbow to push up to sitting and bringing knees to chest to lift his legs onto the bed. Transfers: Patient performed sit to/from stand from the hospital bed slightly elevated, ADL bed, and w/c x3 with CGA-min A with increased pain/fatigue. Provided verbal cues for forward weight shift and breath support for pain management.  Gait Training:  Patient ambulated 69 feet, 128 feet, and 197 feet transitioning between carpet and tile to simulate home surfaces using L PFRW with CGA and +2 w/c follow for safety. Ambulated with narrow BOS, increased supination of B feet r>L, and mild difficulty with management of RW on carpet and over threshold to enter the ADL apartment. Provided verbal cues for increased BOS with notable improvement, paced breathing, and AD managementt over different surfaces.  Patient became diaphoretic and reporting nausea and lightheadedness with mobility, worst after bed mobility. Also, reported increased pain and with visible signs of anxiety with mobility. Provided cues for diaphragmatic breathing, and provided a cool washcloth and increased time for rest breaks to manage symptoms.   Discussed d/c planning, provided  instructions for home measurements to provide to family, discussed equipment recommendations for d/c, and home safety. Patient plans to sleep on his couch and only use  his bed for dressing changes to keep it a more sterile environment due to his dogs. Planning to go home in mid sized SUV with a friend for elevated seat height. Encouraged the patient to take a standing break at least 1x half way for pressure relief, as the patient has a 30-35 min drive home.   Patient in R side-lying propped with pillows in the bed at end of session with breaks locked and all needs within reach.    Therapy Documentation Precautions:  Precautions Precautions: Fall Precaution Comments: flaccid LUE (protect from subluxation); sling when OOB for comfort; skin integrity (sacral wound); monitor vitals Required Braces or Orthoses: Sling Restrictions Weight Bearing Restrictions: No   Therapy/Group: Individual Therapy  Collin Brown PT, DPT  07/06/2020, 9:00 AM

## 2020-07-06 NOTE — Progress Notes (Signed)
[facility] PHYSICAL MEDICINE & REHABILITATION PROGRESS NOTE   Subjective/Complaints: Patient seen laying in bed this morning.  He states he slept well overnight.  He is working with therapies.  He complains of bottom pain.  Discussed pain meds.  ROS: Denies CP, SOB, N/V/D   No results found. No results for input(s): WBC, HGB, HCT, PLT in the last 72 hours. Recent Labs    07/06/20 0442  NA 137  K 4.0  CL 99  CO2 25  GLUCOSE 104*  BUN 9  CREATININE 0.56*  CALCIUM 9.6    Intake/Output Summary (Last 24 hours) at 07/06/2020 1447 Last data filed at 07/06/2020 1351 Gross per 24 hour  Intake 480 ml  Output 900 ml  Net -420 ml     Pressure Injury 06/08/20 Coccyx Unstageable - Full thickness tissue loss in which the base of the injury is covered by slough (yellow, tan, gray, green or brown) and/or eschar (tan, brown or black) in the wound bed. yellow necrotic tissue in the crease o (Active)  06/08/20 1830 (with Lizabeth Leyden RN)  Location: Coccyx  Location Orientation:   Staging: Unstageable - Full thickness tissue loss in which the base of the injury is covered by slough (yellow, tan, gray, green or brown) and/or eschar (tan, brown or black) in the wound bed.  Wound Description (Comments): yellow necrotic tissue in the crease originally put in as a nonpressure wound  Present on Admission: Yes    Physical Exam: Vital Signs Blood pressure (!) 143/87, pulse (!) 106, temperature 97.6 F (36.4 C), temperature source Oral, resp. rate 19, height 5\' 10"  (1.778 m), weight (!) 156.9 kg, SpO2 96 %. Constitutional: No distress . Vital signs reviewed. HENT: Normocephalic.  Atraumatic. Eyes: EOMI. No discharge. Cardiovascular: No JVD.  RRR. Respiratory: Normal effort.  No stridor.  Bilateral clear to auscultation. GI: Non-distended.  BS +. Skin: Warm and dry.  Coccyx wound deep with some granulation tissue. Psych: Flat.  Normal behavior. Musc: No edema in extremities.  No tenderness  in extremities. Neuro: Alert Motor: LUE: 1+/5 proximal distal, unchanged RUE: 4/5 proximal distal Bilateral lower extremities: 4-4+/5 proximal to distal   Assessment/Plan: 1. 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  Physiatrist is providing close team supervision and 24 hour management of active medical problems listed below.  Physiatrist and rehab team continue to assess barriers to discharge/monitor patient progress toward functional and medical goals  Care Tool:  Bathing    Body parts bathed by patient: Left arm, Chest, Abdomen, Face   Body parts bathed by helper: Right arm     Bathing assist Assist Level: Minimal Assistance - Patient > 75%     Upper Body Dressing/Undressing Upper body dressing   What is the patient wearing?: Hospital gown only    Upper body assist Assist Level: Minimal Assistance - Patient > 75%    Lower Body Dressing/Undressing Lower body dressing      What is the patient wearing?: Pants     Lower body assist Assist for lower body dressing: Maximal Assistance - Patient 25 - 49%     Toileting Toileting    Toileting assist Assist for toileting: Moderate Assistance - Patient 50 - 74%     Transfers Chair/bed transfer  Transfers assist  Chair/bed transfer activity did not occur: Safety/medical concerns  Chair/bed transfer assist level: Contact Guard/Touching assist Chair/bed transfer assistive device:  Ambulation   Ambulation assist   Ambulation activity did not occur: Safety/medical concerns  Assist level: Contact Guard/Touching assist Assistive device: Walker-platform Max distance: 93 ft   Walk 10 feet activity   Assist  Walk 10 feet activity did not occur: Safety/medical concerns  Assist level: Contact Guard/Touching assist Assistive device: Walker-platform   Walk 50 feet  activity   Assist Walk 50 feet with 2 turns activity did not occur: Safety/medical concerns (did not perform turns)  Assist level: Contact Guard/Touching assist Assistive device: Walker-platform    Walk 150 feet activity   Assist Walk 150 feet activity did not occur: Safety/medical concerns  Assist level: 2 helpers      Walk 10 feet on uneven surface  activity   Assist Walk 10 feet on uneven surfaces activity did not occur: Safety/medical concerns         Wheelchair     Assist Will patient use wheelchair at discharge?: No   Wheelchair activity did not occur: Safety/medical concerns         Wheelchair 50 feet with 2 turns activity    Assist    Wheelchair 50 feet with 2 turns activity did not occur: Safety/medical concerns       Wheelchair 150 feet activity     Assist  Wheelchair 150 feet activity did not occur: Safety/medical concerns       Blood pressure (!) 143/87, pulse (!) 106, temperature 97.6 F (36.4 C), temperature source Oral, resp. rate 19, height 5\' 10"  (1.778 m), weight (!) 156.9 kg, SpO2 96 %.  Medical Problem List and Plan: 1.  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  Antithrombotics: -DVT/anticoagulation: Lovenox 80 mg daily per surgery             -antiplatelet therapy: Aspirin 81 mg daily 3. Pain Management: sacrum/LUE  11/12: continue gabapentin, kpad   -dilaudid for more severe pain  Oxycontin to 20mg  q12 to decrease dependence on PRN's    -reduced prn dilaudid to 2mg  q12 prn  Tolerable with meds on 11/26 4. Mood: pt needs regular ego support, falling into some dependent personality patterns             -antipsychotic agents: N/A 5. Neuropsych: This patient is capable of making decisions on his own behalf. 6. 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 Fluids/Electrolytes/Nutrition: encourage po 8.  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  Patient completed 5-day course Solu-Medrol             Supportive care with range of motion             Recommend NCV/EMG as outpatient.  -gabapentin as above  WHO ordered 9.  New onset diabetes mellitus, hemoglobin A1c 7.2.  NovoLog 4 units 3 times daily, Levemir 15 units twice daily.               CBG (last 3)  Recent Labs    07/05/20 2128 07/06/20 0551 07/06/20 1149  GLUCAP 126* 104* 94   Relatively controlled on 11/25 10.  Hypertension with tachycardia.  Lopressor 50 mg twice daily.    Blood pressure relatively controlled on 11/26  Borderline tachycardia on 11/26  11.  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  Dietary follow-up  11/14- BMI down to 49.63 12. Fever, UCX with 100 Klebsiella: Resolved  -stool sample also positive for salmonella  -chest xray and exam unremarkable  -blood cultures negative  -abx narrowed to augmentin which completed 11/11 13. Hypokalemia:   Potassium 4.0 on 11/26  Continue supplementation  14.  Drug-induced constipation  On miralax and senna-s bid  Linzess started on 11/23, increased on 11/27 15.  Acute blood loss anemia  Hemoglobin 11.5 on 11/21  Continue to monitor    LOS: 28 days A FACE TO FACE EVALUATION WAS PERFORMED  Saban Heinlen Karis Juba 07/06/2020, 2:47 PM

## 2020-07-07 ENCOUNTER — Inpatient Hospital Stay (HOSPITAL_COMMUNITY): Payer: BC Managed Care – PPO | Admitting: Occupational Therapy

## 2020-07-07 ENCOUNTER — Inpatient Hospital Stay (HOSPITAL_COMMUNITY): Payer: BC Managed Care – PPO

## 2020-07-07 LAB — GLUCOSE, CAPILLARY
Glucose-Capillary: 99 mg/dL (ref 70–99)
Glucose-Capillary: 111 mg/dL — ABNORMAL HIGH (ref 70–99)
Glucose-Capillary: 98 mg/dL (ref 70–99)
Glucose-Capillary: 109 mg/dL — ABNORMAL HIGH (ref 70–99)

## 2020-07-07 NOTE — Progress Notes (Signed)
Occupational Therapy Session Note  Patient Details  Name: Collin Brown MRN: [id number] Date of Birth: [birth year]  Today's Date: 07/07/2020 OT Individual Time: 1030-1100 and 1400-1439 OT Individual Time Calculation (min): 30 min and 39 min (missed 21 mins)   Short Term Goals: Week 4:  OT Short Term Goal 1 (Week 4): STG= LTG d/t ELOS  Skilled Therapeutic Interventions/Progress Updates:    1) Treatment session with focus on LUE AAROM.  Pt received in sidelying in bed reporting hydro just completed treatment and therefore in too much pain for any OOB activity.  Therapist encouraged pt to engage in ROM with LUE while in sidelying.  Utilized UE Ranger as in previous session to facilitate increased range and tolerance.  Therapist guided pt through shoulder flexion/extension, forward reach, diagonal reach, elbow flexion, and gentle circles in both directions.  Pt required facilitation intermittently as pt would get "stuck" in too much flexion and unable to counteract gravity.  Pt remained in sidelying at end of session.  2) Treatment session with focus on self-care retraining.  Pt received in sidelying reporting pleased with increased ambulation and ability to complete stairs during PT session just prior to this session.  Pt expressed desire to engage in bathing tasks and requested that his feet be scrubbed.  Pt completed bed mobility with Min assist to come to sitting due to decreased weight shift and reported pain in buttocks.  Pt able to engage in UB bathing with assistance to wash RUE.  Pt deferred washing buttocks due to wound and reports nursing staff assisting with hygiene during toileting.  Therapist scrubbed feet while pt maintained sitting balance for one leg and then reports too much pain in buttocks.  Returned to sidelying with CGA and therapist washed other foot.  Therapist applied lotion to feet and donned fresh socks.  Discussed thicker (Eucerin) style lotion for feet to maintain moisture.   Pt remained in sidelying to ease pain in buttocks with plan to return to sitting for oral care.  However after a few mins rest, pt reports too much pain and will not be able to sit to complete grooming tasks.  Pt requested to remain in sidelying and rest as he is not due for any pain meds for a couple hours.  Pt missed remaining 21 mins due to pain.  Therapy Documentation Precautions:  Precautions Precautions: Fall Precaution Comments: flaccid LUE (protect from subluxation); sling when OOB for comfort; skin integrity (sacral wound); monitor vitals Required Braces or Orthoses: Sling Restrictions Weight Bearing Restrictions: No  General OT Amount of Missed Time: 21 Minutes Pain: Pain Assessment Pain Scale: 0-10 Pain Score: 8    Therapy/Group: Individual Therapy  Rosalio Loud 07/07/2020, 3:09 PM

## 2020-07-07 NOTE — Plan of Care (Signed)
Patient is alert and oriented x4. Patient has pain to sacrum, stage 4 sacral wound. Patient medicated with prn oxycodone. Dressing changed per order. Patient had his CPAP on when sleeping. Problem: Consults Goal: RH GENERAL PATIENT EDUCATION Description: See Patient Education module for education specifics. Outcome: Progressing Goal: Skin Care Protocol Initiated - if Braden Score 18 or less Description: If consults are not indicated, leave blank or document N/A Outcome: Progressing Goal: Nutrition Consult-if indicated Outcome: Progressing Goal: Diabetes Guidelines if Diabetic/Glucose > 140 Description: If diabetic or lab glucose is > 140 mg/dl - Initiate Diabetes/Hyperglycemia Guidelines & Document Interventions  Outcome: Progressing   Problem: RH BOWEL ELIMINATION Goal: RH STG MANAGE BOWEL WITH ASSISTANCE Description: STG Manage Bowel with  mod Assistance. Outcome: Progressing Goal: RH STG MANAGE BOWEL W/MEDICATION W/ASSISTANCE Description: STG Manage Bowel with Medication with supervision Assistance. Outcome: Progressing   Problem: RH SKIN INTEGRITY Goal: RH STG SKIN FREE OF INFECTION/BREAKDOWN Description: Skin to remain free from additional breakdown while on rehab with supervision assistance. Outcome: Progressing Goal: RH STG MAINTAIN SKIN INTEGRITY WITH ASSISTANCE Description: STG Maintain Skin Integrity With  mod I Assistance. Outcome: Progressing Goal: RH STG ABLE TO PERFORM INCISION/WOUND CARE W/ASSISTANCE Description: STG Able To Perform Incision/Wound Care With mod I Assistance. Outcome: Progressing   Problem: RH SAFETY Goal: RH STG ADHERE TO SAFETY PRECAUTIONS W/ASSISTANCE/DEVICE Description: STG Adhere to Safety Precautions With  mod I Assistance/Device. Outcome: Progressing   Problem: RH PAIN MANAGEMENT Goal: RH STG PAIN MANAGED AT OR BELOW PT'S PAIN GOAL Description: Pain free and <3  Outcome: Progressing

## 2020-07-07 NOTE — Progress Notes (Signed)
[facility] PHYSICAL MEDICINE & REHABILITATION PROGRESS NOTE   Subjective/Complaints: Patient seen laying in bed this morning.  He states he slept well overnight.  He is questions regarding pain medications in association with hydrotherapy today.  ROS: Denies CP, SOB, N/V/D   No results found. No results for input(s): WBC, HGB, HCT, PLT in the last 72 hours. Recent Labs    07/06/20 0442  NA 137  K 4.0  CL 99  CO2 25  GLUCOSE 104*  BUN 9  CREATININE 0.56*  CALCIUM 9.6    Intake/Output Summary (Last 24 hours) at 07/07/2020 1342 Last data filed at 07/07/2020 1240 Gross per 24 hour  Intake 780 ml  Output 1200 ml  Net -420 ml     Pressure Injury 06/08/20 Coccyx Unstageable - Full thickness tissue loss in which the base of the injury is covered by slough (yellow, tan, gray, green or brown) and/or eschar (tan, brown or black) in the wound bed. yellow necrotic tissue in the crease o (Active)  06/08/20 1830 (with Lizabeth Leyden RN)  Location: Coccyx  Location Orientation:   Staging: Unstageable - Full thickness tissue loss in which the base of the injury is covered by slough (yellow, tan, gray, green or brown) and/or eschar (tan, brown or black) in the wound bed.  Wound Description (Comments): yellow necrotic tissue in the crease originally put in as a nonpressure wound  Present on Admission: Yes    Physical Exam: Vital Signs Blood pressure 136/81, pulse 85, temperature 98.2 F (36.8 C), temperature source Oral, resp. rate 20, height 5\' 10"  (1.778 m), weight (!) 156.9 kg, SpO2 99 %. Constitutional: No distress . Vital signs reviewed.  Morbidly obese. HENT: Normocephalic.  Atraumatic. Eyes: EOMI. No discharge. Cardiovascular: No JVD.  RRR. Respiratory: Normal effort.  No stridor.  Bilateral clear to auscultation. GI: Non-distended.  BS +. Skin: Warm and dry.  Coccyx ulcer not examined today. Psych: Flat.  Normal behavior. Musc: No edema in extremities.  No tenderness in  extremities. Neuro: Alert Motor: LUE: 1+/5 proximal distal, stable RUE: 4/5 proximal distal Bilateral lower extremities: 4-4+/5 proximal to distal   Assessment/Plan: 1. 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  Physiatrist is providing close team supervision and 24 hour management of active medical problems listed below.  Physiatrist and rehab team continue to assess barriers to discharge/monitor patient progress toward functional and medical goals  Care Tool:  Bathing    Body parts bathed by patient: Left arm, Chest, Abdomen, Face   Body parts bathed by helper: Right arm     Bathing assist Assist Level: Minimal Assistance - Patient > 75%     Upper Body Dressing/Undressing Upper body dressing   What is the patient wearing?: Hospital gown only    Upper body assist Assist Level: Minimal Assistance - Patient > 75%    Lower Body Dressing/Undressing Lower body dressing      What is the patient wearing?: Pants     Lower body assist Assist for lower body dressing: Maximal Assistance - Patient 25 - 49%     Toileting Toileting    Toileting assist Assist for toileting: Moderate Assistance - Patient 50 - 74%     Transfers Chair/bed transfer  Transfers assist  Chair/bed transfer activity did not occur: Safety/medical concerns  Chair/bed transfer assist level: Contact Guard/Touching assist Chair/bed transfer assistive device:   Ambulation assist  Ambulation activity did not occur: Safety/medical concerns  Assist level: Contact Guard/Touching assist Assistive device: Walker-platform Max distance: 197 ft   Walk 10 feet activity   Assist  Walk 10 feet activity did not occur: Safety/medical concerns  Assist level: Contact Guard/Touching assist Assistive device: Walker-platform   Walk 50 feet activity    Assist Walk 50 feet with 2 turns activity did not occur: Safety/medical concerns (did not perform turns)  Assist level: Contact Guard/Touching assist Assistive device: Walker-platform    Walk 150 feet activity   Assist Walk 150 feet activity did not occur: Safety/medical concerns  Assist level: Contact Guard/Touching assist Assistive device: Walker-platform    Walk 10 feet on uneven surface  activity   Assist Walk 10 feet on uneven surfaces activity did not occur: Safety/medical concerns         Wheelchair     Assist Will patient use wheelchair at discharge?: No   Wheelchair activity did not occur: Safety/medical concerns         Wheelchair 50 feet with 2 turns activity    Assist    Wheelchair 50 feet with 2 turns activity did not occur: Safety/medical concerns       Wheelchair 150 feet activity     Assist  Wheelchair 150 feet activity did not occur: Safety/medical concerns       Blood pressure 136/81, pulse 85, temperature 98.2 F (36.8 C), temperature source Oral, resp. rate 20, height 5\' 10"  (1.778 m), weight (!) 156.9 kg, SpO2 99 %.  Medical Problem List and Plan: 1.  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  Antithrombotics: -DVT/anticoagulation: Lovenox 80 mg daily per surgery             -antiplatelet therapy: Aspirin 81 mg daily 3. Pain Management: sacrum/LUE  11/12: continue gabapentin, kpad   -dilaudid for more severe pain  Oxycontin to 20mg  q12 to decrease dependence on PRN's    -reduced prn dilaudid to 2mg  q12 prn  Relatively controlled with meds on 11/27 4. Mood: pt needs regular ego support, falling into some dependent personality patterns             -antipsychotic agents: N/A 5. Neuropsych: This patient is capable of making decisions on his own behalf. 6. Skin/Wound Care: sacral wound is a stage IV   -continue Santyl ointment, dressing  -PT/hydrotherapy Appreciate surgery debridement 11/19  -Wound care orders per surgery/WOC RN   -dressing: packed gauzed moistened with Dakin's soln    -continue daily hydrotherapy 7. Fluids/Electrolytes/Nutrition: encourage po 8.  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  Patient completed 5-day course Solu-Medrol             Supportive care with range of motion             Recommend NCV/EMG as outpatient.  -gabapentin as above  WHO ordered, pending 9.  New onset diabetes mellitus, hemoglobin A1c 7.2.  NovoLog 4 units 3 times daily, Levemir 15 units twice daily.               CBG (last 3)  Recent Labs    07/06/20 2103 07/07/20 0620 07/07/20 1128  GLUCAP 130* 99 111*   Relatively controlled on 11/27 10.  Hypertension with tachycardia.  Lopressor 50 mg twice daily.    Blood pressure relatively controlled on 11/27  Borderline tachycardia on 11/27 11.  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  Dietary follow-up  11/14- BMI down to 49.63 12. Fever, UCX with 100 Klebsiella: Resolved  -stool sample also positive for salmonella  -chest xray and exam unremarkable  -blood cultures negative  -abx narrowed to augmentin which completed 11/11 13. Hypokalemia:   Potassium 4.0 on 11/26  Continue supplementation  14.  Drug-induced constipation  On miralax and senna-s bid  Linzess started on 11/23, increased on 11/27  Improving 15.  Acute blood loss anemia  Hemoglobin 11.5 on 11/21, labs ordered for Monday  Continue to monitor    LOS: 29 days A FACE TO FACE EVALUATION WAS PERFORMED  Ankit Karis Juba 07/07/2020, 1:42 PM

## 2020-07-07 NOTE — Progress Notes (Signed)
Physical Therapy Wound Treatment Patient Details  Name: MONTERIO BOB MRN: [id number] Date of Birth: 1986/08/02  Today's Date: 07/07/2020 PT Individual Time:  1008-1030-      Subjective  Subjective: Pt agreeable to wound care Patient and Family Stated Goals: Heal wound Prior Treatments: dressing changes  Pain Score: Pain Score: 5   Wound Assessment  Pressure Injury 06/08/20 Coccyx Unstageable - Full thickness tissue loss in which the base of the injury is covered by slough (yellow, tan, gray, green or brown) and/or eschar (tan, brown or black) in the wound bed. yellow necrotic tissue in the crease o (Active)  Wound Image   07/06/20 1041  Dressing Type ABD;Barrier Film (skin prep);Moist to dry 07/07/20 1030  Dressing Changed 07/07/20 1030  Dressing Change Frequency Daily 07/07/20 1030  State of Healing Early/partial granulation 07/07/20 1030  Site / Wound Assessment Pink;Yellow;Red 07/07/20 1030  % Wound base Red or Granulating 70% 07/07/20 1030  % Wound base Yellow/Fibrinous Exudate 20% 07/07/20 1030  % Wound base Black/Eschar 0% 07/07/20 1030  % Wound base Other/Granulation Tissue (Comment) 10% 07/07/20 1030  Peri-wound Assessment Intact 07/07/20 1030  Wound Length (cm) 3.4 cm 07/06/20 1041  Wound Width (cm) 3.5 cm 07/06/20 1041  Wound Depth (cm) 7.7 cm 07/06/20 1041  Wound Surface Area (cm^2) 11.9 cm^2 07/06/20 1041  Wound Volume (cm^3) 91.63 cm^3 07/06/20 1041  Tunneling (cm) 2.8 at 5:00, 3 at 12:00 07/06/20 1041  Undermining (cm) - 07/06/20 1041  Margins Attached edges (approximated) 07/07/20 1030  Drainage Amount Moderate 07/07/20 1030  Drainage Description Serosanguineous 07/07/20 1030  Treatment Debridement (Selective);Hydrotherapy (Pulse lavage);Packing (Saline gauze) 07/07/20 1030     Santyl applied to wound bed prior to applying dressing.   Hydrotherapy Pulsed lavage therapy - wound location: sacrum Pulsed Lavage with Suction (psi): 8 psi Pulsed Lavage with  Suction - Normal Saline Used: 1000 mL Pulsed Lavage Tip: Tip with splash shield   Wound Assessment and Plan  Wound Therapy - Assess/Plan/Recommendations Wound Therapy - Clinical Statement: WOund continues to progress with less necrotic tissue present today.  No oder noted.  If wound continues to progress may be able to decrease hydrotherapy and focus on dressing changes.  Will discuss on Monday. Wound Therapy - Functional Problem List: decreased mobility Factors Delaying/Impairing Wound Healing: Immobility Hydrotherapy Plan: Debridement;Patient/family education;Pulsatile lavage with suction;Dressing change Wound Therapy - Frequency: 6X / week Wound Therapy - Follow Up Recommendations: Home health RN Wound Plan: see above  Wound Therapy Goals- Improve the function of patient's integumentary system by progressing the wound(s) through the phases of wound healing (inflammation - proliferation - remodeling) by: Decrease Necrotic Tissue to: 20 Decrease Necrotic Tissue - Progress: Met Increase Granulation Tissue to: 80 Increase Granulation Tissue - Progress: Progressing toward goal  Goals will be updated until maximal potential achieved or discharge criteria met.  Discharge criteria: when goals achieved, discharge from hospital, MD decision/surgical intervention, no progress towards goals, refusal/missing three consecutive treatments without notification or medical reason.  GP     Shanna Cisco 07/07/2020, 10:40 AM

## 2020-07-07 NOTE — Progress Notes (Signed)
Physical Therapy Session Note  Patient Details  Name: Collin Brown MRN: [id number] Date of Birth: [birth year]  Today's Date: 07/07/2020 PT Individual Time: 1300-1345 PT Individual Time Calculation (min): 45 min   Short Term Goals: Week 4:  PT Short Term Goal 1 (Week 4): Patient will perform bed mobility with CGA without use of hospital bed functions. PT Short Term Goal 2 (Week 4): Patient will perform basic transfers with min A consistently. PT Short Term Goal 3 (Week 4): Patient will ambulate 100 ft using LRAD with min A. PT Short Term Goal 4 (Week 4): Patient will initiate stair training.  Skilled Therapeutic Interventions/Progress Updates:     Patient in bed upon PT arrival. Patient alert and agreeable to PT session. Patient reported 3-6/10 sacral wound pain during session, RN made aware. PT provided repositioning, rest breaks, and distraction as pain interventions throughout session.   Therapeutic Activity: Bed Mobility: Donned B thigh high TEDs with total A prior to mobility. Patient performed supine to sit with min A and sit to supine with supervision with air mattress fully inflated in a flat bed without use of bed rail. Provided verbal cues for increased forward weight shift over his elbow to improve leverage for pushing up to sitting. Patient sat EOB with mod I to don pants and shoes with max A and L arm sling with total A.  Transfers: Patient performed sit to/from stand x4 with CGA-close supervision with and without L PFRW. Provided verbal cues for breath support and forward weight shift.  Gait Training:  Patient ambulated 170 feet x2 using L PFRW with CGA and w/c follow for safety due to decreased activity tolerance. Ambulated with narrow BOS, downward head gaze, and decreased step height. Provided verbal cues for increased BOS and step height for safety, erect posture, looking ahead, and paced breathing for improved breath support throughout. Patient ascended/descended 3-3"  steps using L rail with R upper extremity support using side-stepping technique with CGA. Performed step-to gait pattern leading with R while ascending and L while descending. Provided cues for technique and sequencing.  Attempted 1-6" step with L rail, patient able to place R foot on step, but unable to push up with increased anxiety with this task. Guided patient through diaphragmatic breathing exercises after for anxiety management. Plan to re-attempt next session using R rail for increased support.   Patient without symptoms during session, except after last stair attempt patient diaphoretic and with mild SOB that coincided with increased anxiety and resolved after breathing exercises.   Patient in bed in R side-lying at end of session with breaks locked, bed set to alternating pressure for improved pressure relief, and all needs within reach.   Therapy Documentation Precautions:  Precautions Precautions: Fall Precaution Comments: flaccid LUE (protect from subluxation); sling when OOB for comfort; skin integrity (sacral wound); monitor vitals Required Braces or Orthoses: Sling Restrictions Weight Bearing Restrictions: No   Therapy/Group: Individual Therapy  Bobby Barton L Tristin Vandeusen PT, DPT  07/07/2020, 4:02 PM

## 2020-07-08 ENCOUNTER — Inpatient Hospital Stay (HOSPITAL_COMMUNITY): Payer: BC Managed Care – PPO | Admitting: Occupational Therapy

## 2020-07-08 LAB — GLUCOSE, CAPILLARY
Glucose-Capillary: 99 mg/dL (ref 70–99)
Glucose-Capillary: 111 mg/dL — ABNORMAL HIGH (ref 70–99)
Glucose-Capillary: 110 mg/dL — ABNORMAL HIGH (ref 70–99)
Glucose-Capillary: 197 mg/dL — ABNORMAL HIGH (ref 70–99)

## 2020-07-08 MED ORDER — NALOXEGOL OXALATE 25 MG PO TABS
25.0000 mg | ORAL_TABLET | Freq: Every day | ORAL | Status: DC
  Administered 2020-07-08 – 2020-07-13 (×6): 25 mg via ORAL
  Filled 2020-07-08 (×6): qty 1

## 2020-07-08 MED ORDER — POLYETHYLENE GLYCOL 3350 17 G PO PACK
17.0000 g | PACK | Freq: Two times a day (BID) | ORAL | Status: DC
  Administered 2020-07-08 – 2020-07-11 (×7): 17 g via ORAL
  Filled 2020-07-08 (×8): qty 1

## 2020-07-08 MED ORDER — BISACODYL 10 MG RE SUPP
10.0000 mg | Freq: Once | RECTAL | Status: DC
  Filled 2020-07-08: qty 1

## 2020-07-08 MED ORDER — SORBITOL 70 % SOLN
960.0000 mL | TOPICAL_OIL | Freq: Once | ORAL | Status: AC
  Administered 2020-07-08: 960 mL via RECTAL
  Filled 2020-07-08: qty 473

## 2020-07-08 NOTE — Progress Notes (Signed)
Occupational Therapy Session Note  Patient Details  Name: Collin Brown MRN: [id number] Date of Birth: May 30, 1986  Today's Date: 07/08/2020 OT Individual Time: 3716-9678  &  1500-1530 OT Individual Time Calculation (min): 63 min  &  30 min   Short Term Goals: Week 3:  OT Short Term Goal 1 (Week 3): STG=LTG 2/2 ELOS OT Short Term Goal 1 - Progress (Week 3): Progressing toward goal Week 4:  OT Short Term Goal 1 (Week 4): STG= LTG d/t ELOS  Skilled Therapeutic Interventions/Progress Updates:    AM session:   Patient in bed, alert and ready for therapy session.  He notes that pain in sacrum is under control at this time.  Side lying to sitting edge of bed with CS/CGA.  He tolerates unsupported sitting for left arm shoulder to hand AAROM - needing to return to side lying x2 during session due to nausea/anxiety.  Sit to side lying with CS.    Sit to stand from bed surface with CS - maintained standing for approx 1 minute.  Completed hand AROM and dexterity activities when in side lying position.  He remained in bed at close of session - bed in alternating pressure setting, call bell and tray table in reach.     PM session:    Patient in bed, alert and ready for afternoon session.  Supine to sitting edge of bed with min A.  SPT bed to/from padded shower commode chair with CGA.  He is able to pull pants down with mod A.  Unable to void at this time but sat on commode to complete UB bathing and change hospital gown with set up.  Oral care with set up.  He was able to reach to buttocks for hygiene in stance with CGA - noted blood from suspected hemorrhoid (nursing aware and observed location).  Patient returned to bed with CGA.  Sit to side lying CS, min A to position pillows in bed.  He remained in bed at close of session. Call bell in hand.      Therapy Documentation Precautions:  Precautions Precautions: Fall Precaution Comments: flaccid LUE (protect from subluxation); sling when OOB for  comfort; skin integrity (sacral wound); monitor vitals Required Braces or Orthoses: Sling Restrictions Weight Bearing Restrictions: No  Therapy/Group: Individual Therapy  [name] 07/08/2020, 7:33 AM

## 2020-07-08 NOTE — Progress Notes (Signed)
Orthopedic Tech Progress Note Patient Details:  Collin Brown December 01, 1985 [id number] Ordered outside vendor brace Patient ID: Collin Brown, male   DOB: Sep 11, 1985, 34 y.o.   MRN: [id number]   Gerald Stabs 07/08/2020, 7:08 PM

## 2020-07-08 NOTE — Progress Notes (Signed)
[place] PHYSICAL MEDICINE & REHABILITATION PROGRESS NOTE   Subjective/Complaints: Patient seen laying on his side in bed this morning.  He states he slept well overnight.  He denies complaints.  Noted to be constipated. He states he still has not received his WHO.  ROS: Denies CP, SOB, N/V/D   No results found. No results for input(s): WBC, HGB, HCT, PLT in the last 72 hours. Recent Labs    07/06/20 0442  NA 137  K 4.0  CL 99  CO2 25  GLUCOSE 104*  BUN 9  CREATININE 0.56*  CALCIUM 9.6    Intake/Output Summary (Last 24 hours) at 07/08/2020 1712 Last data filed at 07/08/2020 1232 Gross per 24 hour  Intake 620 ml  Output 1150 ml  Net -530 ml     Pressure Injury 06/08/20 Coccyx Unstageable - Full thickness tissue loss in which the base of the injury is covered by slough (yellow, tan, gray, green or brown) and/or eschar (tan, brown or black) in the wound bed. yellow necrotic tissue in the crease o (Active)  06/08/20 1830 (with Lizabeth Leyden RN)  Location: Coccyx  Location Orientation:   Staging: Unstageable - Full thickness tissue loss in which the base of the injury is covered by slough (yellow, tan, gray, green or brown) and/or eschar (tan, brown or black) in the wound bed.  Wound Description (Comments): yellow necrotic tissue in the crease originally put in as a nonpressure wound  Present on Admission: Yes    Physical Exam: Vital Signs Blood pressure 129/76, pulse 89, temperature (!) 97.5 F (36.4 C), resp. rate 20, height 5\' 10"  (1.778 m), weight (!) 156.9 kg, SpO2 93 %. Constitutional: No distress . Vital signs reviewed.  Morbidly obese. HENT: Normocephalic.  Atraumatic. Eyes: EOMI. No discharge. Cardiovascular: No JVD.  RRR. Respiratory: Normal effort.  No stridor.  Bilateral clear to auscultation. GI: Non-distended.  BS +. Skin: Warm and dry.  Coccyx ulcer not examined today. Psych: Flat.  Normal behavior. Musc: No edema in extremities.  No tenderness in  extremities. Neuro: Alert Motor: LUE: 1+/5 proximal distal, unchanged RUE: 4/5 proximal distal Bilateral lower extremities: 4-4+/5 proximal to distal   Assessment/Plan: 1. 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  Physiatrist is providing close team supervision and 24 hour management of active medical problems listed below.  Physiatrist and rehab team continue to assess barriers to discharge/monitor patient progress toward functional and medical goals  Care Tool:  Bathing    Body parts bathed by patient: Left arm, Chest, Abdomen, Face   Body parts bathed by helper: Right arm, Right lower leg, Left lower leg     Bathing assist Assist Level: Moderate Assistance - Patient 50 - 74%     Upper Body Dressing/Undressing Upper body dressing   What is the patient wearing?: Hospital gown only    Upper body assist Assist Level: Minimal Assistance - Patient > 75%    Lower Body Dressing/Undressing Lower body dressing      What is the patient wearing?: Pants     Lower body assist Assist for lower body dressing: Maximal Assistance - Patient 25 - 49%     Toileting Toileting    Toileting assist Assist for toileting: Moderate Assistance - Patient 50 - 74%     Transfers Chair/bed transfer  Transfers assist  Chair/bed transfer activity did not occur: Safety/medical concerns  Chair/bed transfer assist level: Contact Guard/Touching  assist Chair/bed transfer assistive device: Geologist, engineering   Ambulation assist   Ambulation activity did not occur: Safety/medical concerns  Assist level: Contact Guard/Touching assist Assistive device: Walker-platform Max distance: 170 ft   Walk 10 feet activity   Assist  Walk 10 feet activity did not occur: Safety/medical concerns  Assist level: Contact Guard/Touching assist Assistive device:  Walker-platform   Walk 50 feet activity   Assist Walk 50 feet with 2 turns activity did not occur: Safety/medical concerns (did not perform turns)  Assist level: Contact Guard/Touching assist Assistive device: Walker-platform    Walk 150 feet activity   Assist Walk 150 feet activity did not occur: Safety/medical concerns  Assist level: Contact Guard/Touching assist Assistive device: Walker-platform    Walk 10 feet on uneven surface  activity   Assist Walk 10 feet on uneven surfaces activity did not occur: Safety/medical concerns         Wheelchair     Assist Will patient use wheelchair at discharge?: No   Wheelchair activity did not occur: Safety/medical concerns         Wheelchair 50 feet with 2 turns activity    Assist    Wheelchair 50 feet with 2 turns activity did not occur: Safety/medical concerns       Wheelchair 150 feet activity     Assist  Wheelchair 150 feet activity did not occur: Safety/medical concerns       Blood pressure 129/76, pulse 89, temperature (!) 97.5 F (36.4 C), resp. rate 20, height 5\' 10"  (1.778 m), weight (!) 156.9 kg, SpO2 93 %.  Medical Problem List and Plan: 1.  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  Antithrombotics: -DVT/anticoagulation: Lovenox 80 mg daily per surgery             -antiplatelet therapy: Aspirin 81 mg daily 3. Pain Management: sacrum/LUE  11/12: continue gabapentin, kpad   -dilaudid for more severe pain  Oxycontin to 20mg  q12 to decrease dependence on PRN's    -reduced prn dilaudid to 2mg  q12 prn  Relatively controlled with meds on 11/28 4. Mood: pt needs regular ego support, falling into some dependent personality patterns             -antipsychotic agents: N/A 5. Neuropsych: This patient is capable of making decisions on his own behalf. 6. 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 Fluids/Electrolytes/Nutrition: encourage po 8.  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  Patient completed 5-day course Solu-Medrol             Supportive care with range of motion             Recommend NCV/EMG as outpatient.  -gabapentin as above  WHO ordered again 9.  New onset diabetes mellitus, hemoglobin A1c 7.2.  NovoLog 4 units 3 times daily, Levemir 15 units twice daily.               CBG (last 3)  Recent Labs    07/08/20 0611 07/08/20 1118 07/08/20 1621  GLUCAP 99 111* 110*   Controlled on 11/28 10.  Hypertension with tachycardia.  Lopressor 50 mg twice daily.    Blood pressure controlled on  11/28  Borderline tachycardia on 11/28 11.  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  Dietary follow-up  11/14- BMI down to 49.63 12. Fever, UCX with 100 Klebsiella: Resolved  -stool sample also positive for salmonella  -chest xray and exam unremarkable  -blood cultures negative  -abx narrowed to augmentin which completed 11/11 13. Hypokalemia:   Potassium 4.0 on 11/26  Continue supplementation  14.  Drug-induced constipation  On miralax and senna-s bid  Linzess started on 11/23, increased on 11/27, changed to Movantik on 11/28 15.  Acute blood loss anemia  Hemoglobin 11.5 on 11/21, labs ordered for tomorrow  Continue to monitor    LOS: 30 days A FACE TO FACE EVALUATION WAS PERFORMED  Donnalee Cellucci Karis Juba 07/08/2020, 5:12 PM

## 2020-07-09 ENCOUNTER — Inpatient Hospital Stay (HOSPITAL_COMMUNITY): Payer: BC Managed Care – PPO

## 2020-07-09 ENCOUNTER — Ambulatory Visit: Payer: Self-pay | Admitting: Adult Health

## 2020-07-09 LAB — CBC WITH DIFFERENTIAL/PLATELET
Abs Immature Granulocytes: 0.07 10*3/uL (ref 0.00–0.07)
Basophils Absolute: 0 10*3/uL (ref 0.0–0.1)
Basophils Relative: 0 %
Eosinophils Absolute: 0.1 10*3/uL (ref 0.0–0.5)
Eosinophils Relative: 1 %
HCT: 37 % — ABNORMAL LOW (ref 39.0–52.0)
Hemoglobin: 11.5 g/dL — ABNORMAL LOW (ref 13.0–17.0)
Immature Granulocytes: 1 %
Lymphocytes Relative: 18 %
Lymphs Abs: 1.4 10*3/uL (ref 0.7–4.0)
MCH: 27.5 pg (ref 26.0–34.0)
MCHC: 31.1 g/dL (ref 30.0–36.0)
MCV: 88.5 fL (ref 80.0–100.0)
Monocytes Absolute: 0.6 10*3/uL (ref 0.1–1.0)
Monocytes Relative: 8 %
Neutro Abs: 5.4 10*3/uL (ref 1.7–7.7)
Neutrophils Relative %: 72 %
Platelets: 339 10*3/uL (ref 150–400)
RBC: 4.18 MIL/uL — ABNORMAL LOW (ref 4.22–5.81)
RDW: 16.5 % — ABNORMAL HIGH (ref 11.5–15.5)
WBC: 7.5 10*3/uL (ref 4.0–10.5)
nRBC: 0 % (ref 0.0–0.2)

## 2020-07-09 LAB — GLUCOSE, CAPILLARY
Glucose-Capillary: 103 mg/dL — ABNORMAL HIGH (ref 70–99)
Glucose-Capillary: 118 mg/dL — ABNORMAL HIGH (ref 70–99)
Glucose-Capillary: 95 mg/dL (ref 70–99)
Glucose-Capillary: 131 mg/dL — ABNORMAL HIGH (ref 70–99)

## 2020-07-09 NOTE — Progress Notes (Signed)
[place] PHYSICAL MEDICINE & REHABILITATION PROGRESS NOTE   Subjective/Complaints: Pressure injury pain has been worse since surgery, and Mr. Gamel realizes this is to be expected.  Left arm pain persists, strength is gradually improving Had large BM last night following disimpaction and enema.   ROS: Denies CP, SOB, N/V/D   No results found. Recent Labs    07/09/20 0758  WBC 7.5  HGB 11.5*  HCT 37.0*  PLT 339   No results for input(s): NA, K, CL, CO2, GLUCOSE, BUN, CREATININE, CALCIUM in the last 72 hours.  Intake/Output Summary (Last 24 hours) at 07/09/2020 0958 Last data filed at 07/08/2020 2030 Gross per 24 hour  Intake 200 ml  Output 400 ml  Net -200 ml     Pressure Injury 06/08/20 Coccyx Unstageable - Full thickness tissue loss in which the base of the injury is covered by slough (yellow, tan, gray, green or brown) and/or eschar (tan, brown or black) in the wound bed. yellow necrotic tissue in the crease o (Active)  06/08/20 1830 (with Lizabeth Leyden RN)  Location: Coccyx  Location Orientation:   Staging: Unstageable - Full thickness tissue loss in which the base of the injury is covered by slough (yellow, tan, gray, green or brown) and/or eschar (tan, brown or black) in the wound bed.  Wound Description (Comments): yellow necrotic tissue in the crease originally put in as a nonpressure wound  Present on Admission: Yes    Physical Exam: Vital Signs Blood pressure 140/88, pulse 92, temperature 98.1 F (36.7 C), temperature source Oral, resp. rate 19, height 5\' 10"  (1.778 m), weight (!) 156.9 kg, SpO2 96 %. Gen: no distress, normal appearing HEENT: oral mucosa pink and moist, NCAT Cardio: Reg rate Chest: normal effort, normal rate of breathing Abd: soft, non-distended Ext: no edema Skin: Warm and dry.  Unstageable full thickness tissue loss to coccyx with yellow necrotic tissue.  Psych: Flat.  Normal behavior. Musc: No edema in extremities.  No tenderness in  extremities. Neuro: Alert Motor: LUE: 1+/5 proximal distal, unchanged RUE: 4/5 proximal distal Bilateral lower extremities: 4-4+/5 proximal to distal   Assessment/Plan: 1. 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  Physiatrist is providing close team supervision and 24 hour management of active medical problems listed below.  Physiatrist and rehab team continue to assess barriers to discharge/monitor patient progress toward functional and medical goals  Care Tool:  Bathing    Body parts bathed by patient: Left arm, Chest, Abdomen, Face   Body parts bathed by helper: Right arm, Right lower leg, Left lower leg     Bathing assist Assist Level: Moderate Assistance - Patient 50 - 74%     Upper Body Dressing/Undressing Upper body dressing   What is the patient wearing?: Hospital gown only    Upper body assist Assist Level: Minimal Assistance - Patient > 75%    Lower Body Dressing/Undressing Lower body dressing      What is the patient wearing?: Pants     Lower body assist Assist for lower body dressing: Maximal Assistance - Patient 25 - 49%     Toileting Toileting    Toileting assist Assist for toileting: Moderate Assistance - Patient 50 - 74%     Transfers Chair/bed transfer  Transfers assist  Chair/bed transfer activity did not occur: Safety/medical concerns  Chair/bed transfer assist level: Contact Guard/Touching assist Chair/bed transfer assistive device:  Ambulation   Ambulation assist   Ambulation activity did not occur: Safety/medical concerns  Assist level: Contact Guard/Touching assist Assistive device: Walker-platform Max distance: 170 ft   Walk 10 feet activity   Assist  Walk 10 feet activity did not occur: Safety/medical concerns  Assist level: Contact Guard/Touching assist Assistive device:  Walker-platform   Walk 50 feet activity   Assist Walk 50 feet with 2 turns activity did not occur: Safety/medical concerns (did not perform turns)  Assist level: Contact Guard/Touching assist Assistive device: Walker-platform    Walk 150 feet activity   Assist Walk 150 feet activity did not occur: Safety/medical concerns  Assist level: Contact Guard/Touching assist Assistive device: Walker-platform    Walk 10 feet on uneven surface  activity   Assist Walk 10 feet on uneven surfaces activity did not occur: Safety/medical concerns         Wheelchair     Assist Will patient use wheelchair at discharge?: No   Wheelchair activity did not occur: Safety/medical concerns         Wheelchair 50 feet with 2 turns activity    Assist    Wheelchair 50 feet with 2 turns activity did not occur: Safety/medical concerns       Wheelchair 150 feet activity     Assist  Wheelchair 150 feet activity did not occur: Safety/medical concerns       Blood pressure 140/88, pulse 92, temperature 98.1 F (36.7 C), temperature source Oral, resp. rate 19, height 5\' 10"  (1.778 m), weight (!) 156.9 kg, SpO2 96 %.  Medical Problem List and Plan: 1.  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  Antithrombotics: -DVT/anticoagulation: Lovenox 80 mg daily per surgery             -antiplatelet therapy: Aspirin 81 mg daily 3. Pain Management: sacrum/LUE  11/12: continue gabapentin, kpad   -dilaudid for more severe pain  Oxycontin to 20mg  q12 to decrease dependence on PRN's    -reduced prn dilaudid to 2mg  q12 prn  Controlled 1129 with meds.  4. Mood: pt needs regular ego support, falling into some dependent personality patterns             -antipsychotic agents: N/A 5. Neuropsych: This patient is capable of making decisions on his own behalf. 6. 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 Fluids/Electrolytes/Nutrition: encourage po 8.  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  Patient completed 5-day course Solu-Medrol             Supportive care with range of motion             Recommend NCV/EMG as outpatient.  -gabapentin as above  WHO ordered again 9.  New onset diabetes mellitus, hemoglobin A1c 7.2.  NovoLog 4 units 3 times daily, Levemir 15 units twice daily.               CBG (last 3)  Recent Labs    07/08/20 1621 07/08/20 2131 07/09/20 0620  GLUCAP 110* 197* 103*   Elevated 11/29: continue to monitor.  10.  Hypertension with tachycardia.  Lopressor 50 mg twice daily.    Blood pressure controlled on 11/28  Borderline tachycardia on  11/28 11.  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  Dietary follow-up  11/14- BMI down to 49.63 12. Fever, UCX with 100 Klebsiella: Resolved  -stool sample also positive for salmonella  -chest xray and exam unremarkable  -blood cultures negative  -abx narrowed to augmentin which completed 11/11 13. Hypokalemia:   Potassium 4.0 on 11/26  Continue supplementation  14.  Drug-induced constipation  On miralax and senna-s bid  Linzess started on 11/23, increased on 11/27, changed to Movantik on 11/28 15.  Acute blood loss anemia  Hemoglobin 11.5 on 11/21 and 11/29  Continue to monitor    LOS: 31 days A FACE TO FACE EVALUATION WAS PERFORMED  Drema Pry Amando Ishikawa 07/09/2020, 9:58 AM

## 2020-07-09 NOTE — Consult Note (Signed)
WOC Nurse wound follow up Patient receiving care in [facility] [id number] Patient is scheduled for discharge on Friday PT to decrease Hydrotherapy to MWF. Orders changed as follows:  Apply Santyl to sacral pressure injury in a nickel thick layer. Cover with a saline moistened gauze, then dry gauze or ABD pad.  Change daily.  Bedside nurse to change dressing Su, Tu, Th, Sat  Trinia Georgi L. Katrinka Blazing, MSN, RN, CMSRN, Angus Seller, [facility] Wound Treatment Associate Pager [phone]

## 2020-07-09 NOTE — Progress Notes (Signed)
Occupational Therapy Weekly Progress Note  Patient Details  Name: Collin Brown MRN: [id number] Date of Birth: 1986-05-30  Beginning of progress report period: July 02, 2020 End of progress report period: July 09, 2020  Today's Date: 07/09/2020 OT Individual Time: 1110-1157 OT Individual Time Calculation (min): 47 min    Patient has met 3 of 11 long term goals.  Short term goals not set due to estimated length of stay.  Pt has had a more consistent performance this reporting period, with stabilization in his medical issues that limited him in the prior weeks. His sacral wound is healing and pain has been more manageable. Although he is still often self limiting and struggles with anxiety, he has been more consistently completing ADL transfers with CGA, UB dressing with mod A, and LB dressing with mod A. Family education is scheduled and will hopefully be completed this week, despite his family not arriving for previously scheduled sessions.   Patient continues to demonstrate the following deficits: muscle weakness and muscle paralysis, decreased cardiorespiratoy endurance, decreased problem solving and decreased standing balance, hemiplegia and decreased balance strategies and therefore will continue to benefit from skilled OT intervention to enhance overall performance with BADL and Reduce care partner burden.  See Patient's Care Plan for progression toward long term goals.  Patient not progressing toward long term goals.  See goal revision..  Several goals downgraded to reflect pt need for min A with UB ADLs 2/2 pain and continued weakness in the LUE  Skilled Therapeutic Interventions/Progress Updates:    Pt received supine with 5/10 pain in his sacrum, reporting he is premedicated and willing to participate. Pt completed bed mobility (requesting specific settings of air mattress) with supervision overall, min cueing for technique. Discussed use of resting hand splint, d/t active  flexion discussed d/c of splint. Pt sat EOB and took rest break with slight SOB. Pt stood with PFRW with CGA- supervision. He completed ambulatory transfer to the sink with CGA. Pt stood for 30 seconds before requiring a seated rest break. Pt completed UB bathing with mod A overall, cueing for technique. LB bathing completed with min A in standing with assist for clothing management. Pt with increasing fatigue and requesting to return to bed. He quickly completed oral care and hair care with min A for reaching overhead. Pt returned to supine with CGA. His platform attachment of the RW was changed out for an orthosis to promote increased LUE weightbearing. Pt was left supine with all needs met.   Therapy Documentation Precautions:  Precautions Precautions: Fall Precaution Comments: flaccid LUE (protect from subluxation); sling when OOB for comfort; skin integrity (sacral wound); monitor vitals Required Braces or Orthoses: Sling Restrictions Weight Bearing Restrictions: No  Therapy/Group: Individual Therapy  Curtis Sites 07/09/2020, 6:46 AM

## 2020-07-09 NOTE — Progress Notes (Signed)
Physical Therapy Wound Treatment Patient Details  Name: Collin Brown MRN: [id number] Date of Birth: 1986/07/17  Today's Date: 07/09/2020 PT Individual Time: 1000-1036 PT Individual Time Calculation (min): 36 min   Subjective  Subjective: Pt agreeable to wound care - endorses increased pain due to stair training and bumping up stairs in a seated position.  Patient and Family Stated Goals: Heal wound Date of Onset:  (Unknown) Prior Treatments: dressing changes  Pain Score:  Pt reports increased pain at beginning of session after PT and stair training, however overall tolerated treatment well without complaints of additional pain.   Wound Assessment  Pressure Injury 06/08/20 Coccyx Unstageable - Full thickness tissue loss in which the base of the injury is covered by slough (yellow, tan, gray, green or brown) and/or eschar (tan, brown or black) in the wound bed. yellow necrotic tissue in the crease o (Active)  Dressing Type ABD;Barrier Film (skin prep);Gauze (Comment);Moist to dry 07/09/20 1218  Dressing Changed;Clean;Dry;Intact 07/09/20 1218  Dressing Change Frequency Monday, Wednesday, Friday 07/09/20 1218  State of Healing Early/partial granulation 07/09/20 1218  Site / Wound Assessment Red;Yellow 07/09/20 1218  % Wound base Red or Granulating 80% 07/09/20 1218  % Wound base Yellow/Fibrinous Exudate 10% 07/09/20 1218  % Wound base Black/Eschar 0% 07/09/20 1218  % Wound base Other/Granulation Tissue (Comment) 10% 07/09/20 1218  Peri-wound Assessment Intact 07/09/20 1218  Wound Length (cm) 3.4 cm 07/06/20 1041  Wound Width (cm) 3.5 cm 07/06/20 1041  Wound Depth (cm) 7.7 cm 07/06/20 1041  Wound Surface Area (cm^2) 11.9 cm^2 07/06/20 1041  Wound Volume (cm^3) 91.63 cm^3 07/06/20 1041  Tunneling (cm) 2.8 at 5:00, 3 at 12:00 07/06/20 1041  Undermining (cm) - 07/06/20 1041  Margins Unattached edges (unapproximated) 07/09/20 1218  Drainage Amount Moderate 07/09/20 1218  Drainage  Description Serosanguineous;Odor 07/09/20 1218  Treatment Debridement (Selective);Hydrotherapy (Pulse lavage);Packing (Saline gauze) 07/09/20 1218   Santyl applied to wound bed prior to applying dressing.     Hydrotherapy Pulsed lavage therapy - wound location: sacrum Pulsed Lavage with Suction (psi): 12 psi Pulsed Lavage with Suction - Normal Saline Used: 1000 mL Pulsed Lavage Tip: Tip with splash shield Selective Debridement Selective Debridement - Location: sacrum Selective Debridement - Tools Used: Forceps;Scissors Selective Debridement - Tissue Removed: Yellow unviable tissue   Wound Assessment and Plan  Wound Therapy - Assess/Plan/Recommendations Wound Therapy - Clinical Statement: Wound continues to improve in appearance. Some loose unviable tissue removed from base of wound however very close to bone. Wound odorous today. Feel the wound is appropriate for hydrotherapy to decrease frequency to 3x/week (Mon-Wed-Fri typically), with nursing staff to continue with dressing changes between hydrotherapy sessions. WOC RN updated and to update orders. Will see again on 12/1.  Wound Therapy - Functional Problem List: decreased mobility Factors Delaying/Impairing Wound Healing: Immobility Hydrotherapy Plan: Debridement;Patient/family education;Pulsatile lavage with suction;Dressing change Wound Therapy - Frequency: 6X / week Wound Therapy - Follow Up Recommendations: Home health RN Wound Plan: see above  Wound Therapy Goals- Improve the function of patient's integumentary system by progressing the wound(s) through the phases of wound healing (inflammation - proliferation - remodeling) by: Decrease Necrotic Tissue to: 0 Decrease Necrotic Tissue - Progress: Updated due to goal met Increase Granulation Tissue to: 100 Increase Granulation Tissue - Progress: Updated due to goal met Goals/treatment plan/discharge plan were made with and agreed upon by patient/family: Yes Time For Goal  Achievement: 7 days Wound Therapy - Potential for Goals: Good  Goals will be updated until  maximal potential achieved or discharge criteria met.  Discharge criteria: when goals achieved, discharge from hospital, MD decision/surgical intervention, no progress towards goals, refusal/missing three consecutive treatments without notification or medical reason.  GP     [name] 07/09/2020, 12:38 PM   [name], PT, DPT Acute Rehabilitation Services Pager: [phone] Office: [phone]

## 2020-07-09 NOTE — Progress Notes (Signed)
Physical Therapy Session Note  Patient Details  Name: Collin Brown MRN: [id number] Date of Birth: [birth year]  Today's Date: 07/09/2020 PT Individual Time: 1300-1345 PT Individual Time Calculation (min): 45 min   Short Term Goals: Week 1:  PT Short Term Goal 1 (Week 1): Pt will perform bed mobility with maxA +1 person PT Short Term Goal 1 - Progress (Week 1): Met PT Short Term Goal 2 (Week 1): Pt will perform bed<>chair transfers with maxA and LRAD PT Short Term Goal 2 - Progress (Week 1): Met PT Short Term Goal 3 (Week 1): Pt will tolerate sitting in chair/recliner for >1 hour with stable vital signs PT Short Term Goal 3 - Progress (Week 1): Met PT Short Term Goal 4 (Week 1): Pt will begin initiating pre-gait training with maxA and LRAD PT Short Term Goal 4 - Progress (Week 1): Met Week 2:  PT Short Term Goal 1 (Week 2): Patient will perform bed mobility with CGA without use of hospital bed functions. PT Short Term Goal 1 - Progress (Week 2): Progressing toward goal PT Short Term Goal 2 (Week 2): Patient will perform basic transfers with min A consitently. PT Short Term Goal 2 - Progress (Week 2): Progressing toward goal PT Short Term Goal 3 (Week 2): Patient will ambulate 100 ft using LRAD with mod A. PT Short Term Goal 3 - Progress (Week 2): Progressing toward goal Week 3:  PT Short Term Goal 1 (Week 3): Patient will perform bed mobility with CGA without use of hospital bed functions. PT Short Term Goal 1 - Progress (Week 3): Progressing toward goal PT Short Term Goal 2 (Week 3): Patient will perform basic transfers with min A consistently. PT Short Term Goal 2 - Progress (Week 3): Progressing toward goal PT Short Term Goal 3 (Week 3): Patient will ambulate 100 ft using LRAD with min A. PT Short Term Goal 3 - Progress (Week 3): Progressing toward goal PT Short Term Goal 4 (Week 3): Patient will initiate stair training. PT Short Term Goal 4 - Progress (Week 3): Progressing toward  goal Week 4:  PT Short Term Goal 1 (Week 4): Patient will perform bed mobility with CGA without use of hospital bed functions. PT Short Term Goal 2 (Week 4): Patient will perform basic transfers with min A consistently. PT Short Term Goal 3 (Week 4): Patient will ambulate 100 ft using LRAD with min A. PT Short Term Goal 4 (Week 4): Patient will initiate stair training.  Skilled Therapeutic Interventions/Progress Updates:     PAIN 4/10 sacral area, states it was worsened w/stair training but relieved w/rest.  Pt initially supine and agreeable to session w/focus on endurance/gait.  Supine to sit w/use of rail and supervision, slowly due to pain w/transition.  STS w/cga to RW.  L hand orthosis strap adjustment made for optimal posiitoning.  Pt the proceeded w/ GAIT trials using RW and w/cga to close supervision as follows [id number]  [id number] rested in sitting on [place]/wc several min between efforts.  Rest limited by poor sitting tolerance/pain.  Pt able to transfer w/RW to bed w/cga, transitions to sidelying rapidly due to pain at sacral area. Pt repositioned in sidelying w/pillows for support. Pt left sidelying w/rails up x 4, bed in lowest position, and needs in reach.  Therapy Documentation Precautions:  Precautions Precautions: Fall Precaution Comments: flaccid LUE (protect from subluxation); sling when OOB for comfort; skin integrity (sacral wound); monitor vitals Required Braces or Orthoses: Sling Restrictions  Weight Bearing Restrictions: No   Therapy/Group: Individual Therapy  Callie Fielding, [place] 07/09/2020, 3:39 PM

## 2020-07-09 NOTE — Plan of Care (Signed)
Several goals downgraded to reflect limiting pain in the LUE and slow pt progress, d/t several medical issues.   Problem: RH Bathing Goal: LTG Patient will bathe all body parts with assist levels (OT) Description: LTG: Patient will bathe all body parts with assist levels (OT) Flowsheets (Taken 07/09/2020 0643) LTG: Pt will perform bathing with assistance level/cueing: (downgraded 11/29 d/t slow progress and LUE pain- SD) Minimal Assistance - Patient > 75% Note: downgraded 11/29 d/t slow progress and LUE pain- SD   Problem: RH Dressing Goal: LTG Patient will perform upper body dressing (OT) Description: LTG Patient will perform upper body dressing with assist, with/without cues (OT). Flowsheets (Taken 07/09/2020 [id number]) LTG: Pt will perform upper body dressing with assistance level of: (downgraded 11/29 d/t slow progress and LUE pain- SD) Minimal Assistance - Patient > 75% Note: downgraded 11/29 d/t slow progress and LUE pain- SD   Problem: RH Functional Use of Upper Extremity Goal: LTG Patient will use RT/LT upper extremity as a (OT) Description: LTG: Patient will use right/left upper extremity as a stabilizer/gross assist/diminished/nondominant/dominant level with assist, with/without cues during functional activity (OT) Flowsheets (Taken 07/09/2020 0643) LTG: Use of upper extremity in functional activities: (downgraded 11/29 d/t slow progress and LUE pain- SD) --   Problem: RH Tub/Shower Transfers Goal: LTG Patient will perform tub/shower transfers w/assist (OT) Description: LTG: Patient will perform tub/shower transfers with assist, with/without cues using equipment (OT) Outcome: Not Applicable Flowsheets (Taken 07/09/2020 0643) LTG: Pt will perform tub/shower stall transfers with assistance level of: (pt unable to shower at this time, goal d/c) --

## 2020-07-09 NOTE — Progress Notes (Signed)
Physical Therapy Session Note  Patient Details  Name: Collin Brown MRN: [id number] Date of Birth: [birth year]  Today's Date: 07/09/2020 PT Individual Time: 1117-3567 and 1455-1533 PT Individual Time Calculation (min): 50 min and 38 min  Short Term Goals: Week 4:  PT Short Term Goal 1 (Week 4): Patient will perform bed mobility with CGA without use of hospital bed functions. PT Short Term Goal 2 (Week 4): Patient will perform basic transfers with min A consistently. PT Short Term Goal 3 (Week 4): Patient will ambulate 100 ft using LRAD with min A. PT Short Term Goal 4 (Week 4): Patient will initiate stair training.  Skilled Therapeutic Interventions/Progress Updates:     Session 1: Patient in bed in R side-lying upon PT arrival. Patient alert and agreeable to PT session. Patient reported 6/10 sacral wound pain during session, RN made aware. PT provided repositioning, rest breaks, and distraction as pain interventions throughout session. Pain escalated to 8-9/10 following stair training, RN made aware.  Therapeutic Activity: Bed Mobility: Donned B thigh high TEDs bed level prior to mobility. Patient performed supine to/from sit with supervision with the bed flat, without use of bed rails, and air mattress on max inflate. Provided verbal cues for elbow positioning and forward weight shift over his arm to push up to sitting. Donned pants with max A for threading his feet through and mod A for pulling pants up in standing with some assist from his L hand. Donned L arm sling with total A prior to gait training.  Transfers: Patient performed sit to/from stand x2 and stand pivot x1 with CGA-close supervision without AD. Provided verbal cues for forward weight shift and increased breath support with mobility. Patient stepped onto the standing scale during session with CGA for safety. Current weight 341.0 lbs.  Gait Training:  Patient ambulated 180 feet using L PFRW with close supervision and w/c  follow due to decreased activity tolerance. Ambulated with decreased gait speed, decreased step-length, increased B foot supination in stance, narrow BOS, and increased toe out. Provided verbal cues for paced breathing and increased BOS for safety throughout. Patient ascended/descended 1-6" step using R rail with min A. Performed step-to gait pattern leading with R while ascending and L while descending. Provided cues for technique and sequencing.   Wheelchair Mobility:  Patient was transported in the w/c with total A back to his room due to increased sacral pain from stair training.   Patient in R side-lying in the bed positioned with pillow for improved pressure relief and L arm positioning at end of session with breaks locked and all needs within reach. Patient required increased time and rest breaks with all mobility due to decreased activity tolerance, increased sacral pain, and increased anxiety with mobility. Provided cues for diaphragmatic breathing and coping strategies during rest breaks.  Session 2: Patient in R side-lying in the bed upon PT arrival. Patient alert and agreeable to PT session. Patient reported 6-7/10 sacral wound pain during session, RN made aware. PT provided repositioning, rest breaks, and distraction as pain interventions throughout session. Also, reported increased fatigue and bragged about increased ambulation distance during last PT session!   Discussed d/c planning with patient. Based on improved gait distances decided with patient that manual w/c will not be necessary for mobility at d/c. Also, patient now ambulating with L hand splint on RW versus platform. Will update d/c recommendations and inform CSW tomorrow in conference. [name] scheduled to come in for family education tomorrow. Confirmed time  with scheduling and informed patient that sessions begin at 9:15 and end at 12 or 1:45 for PT, hydrotherapy, and OT. Provided option for family to stay for second  PT session if needed. [name] has not yet provided measurements requested for household doorways, furniture, and step height. Patient plans to video call his mother tonight to obtain measurements and provide instructions for measurements needs. Will use measurements to simulate household mobility during family education. Patient reports feeling good about d/c on Friday and stated that he is looking forward to being home.   Therapeutic Activity: Bed Mobility: Patient performed supine to/from sit with min A to come to sitting due to increased fatigue, otherwise supervision as above.  Transfers: Patient performed sit to/from stand x1 with supervision using RW with L hand splint. Patient able to place L hand in splint, but required assist for fastening strap.  Gait Training:  Patient ambulated 35 feet using RW with L hand splint with close supervision for safety, performed several 90 and 180 degree turns to simulate home mobility around obstacles. Ambulated as above. Provided verbal cues for increased L hand weight bearing for improved control and strength/support with hand splint.  Patient in R side-lying in the bed positioned with pillow for improved pressure relief and L arm positioning at end of session with breaks locked and all needs within reach.  Therapy Documentation Precautions:  Precautions Precautions: Fall Precaution Comments: flaccid LUE (protect from subluxation); sling when OOB for comfort; skin integrity (sacral wound); monitor vitals Required Braces or Orthoses: Sling Restrictions Weight Bearing Restrictions: No   Therapy/Group: Individual Therapy  Kiegan Macaraeg L Quanda Pavlicek PT, DPT  07/09/2020, 12:32 PM

## 2020-07-09 NOTE — Progress Notes (Signed)
Patient ID: Collin Brown, male   DOB: 1986-01-13, 34 y.o.   MRN: [id number]  SW met with pt in room to confirm family edu tomorrow 11/30 with his mother, sister [name], and friend [name]. SW called pt mother to inform to come in at 11am due to pt hydrotherapy. Pt mother reports the plan is to be present for wound care tomorrow. Plans to arrive at 9:30am.   Loralee Pacas, MSW, [place] Office: [phone] Cell: [phone] Fax: [phone]

## 2020-07-10 ENCOUNTER — Inpatient Hospital Stay (HOSPITAL_COMMUNITY): Payer: BC Managed Care – PPO | Admitting: Physical Therapy

## 2020-07-10 ENCOUNTER — Inpatient Hospital Stay (HOSPITAL_COMMUNITY): Payer: BC Managed Care – PPO

## 2020-07-10 ENCOUNTER — Encounter (HOSPITAL_COMMUNITY): Payer: BC Managed Care – PPO

## 2020-07-10 ENCOUNTER — Ambulatory Visit (HOSPITAL_COMMUNITY): Payer: BC Managed Care – PPO

## 2020-07-10 LAB — GLUCOSE, CAPILLARY
Glucose-Capillary: 102 mg/dL — ABNORMAL HIGH (ref 70–99)
Glucose-Capillary: 106 mg/dL — ABNORMAL HIGH (ref 70–99)
Glucose-Capillary: 110 mg/dL — ABNORMAL HIGH (ref 70–99)
Glucose-Capillary: 180 mg/dL — ABNORMAL HIGH (ref 70–99)

## 2020-07-10 NOTE — Progress Notes (Addendum)
Patient ID: Collin Brown, male   DOB: 08/06/1986, 34 y.o.   MRN: [id number]  Per PT, pt no longer requires RW with roho cushion. Now recommends RW with left platform.   SW met with pt, pt mother, pt sister [name], and friend [name] in room to provide updates on DME, and discuss challenges with obtaining HH. SW informed pt if unable to obtain HH services by discharge. May need to consider a wound care clinic. Concerns surrounding transportation were mentioned due to vehicle still being out of commission. SW explained options include local public transportation services, assistance from others, or uber/lyft as options. SW informed efforts will continue to be made to secure HH.   SW sent HHPT/OT/SN referral to several HHAs. SW waiting on follow-up. *HHPT/OT/SN referral accepted by [facility].     , MSW, LCSWA Office: [phone] Cell: [phone] Fax: [phone] 

## 2020-07-10 NOTE — Progress Notes (Signed)
Physical Therapy Session Note  Patient Details  Name: Collin Brown MRN: [id number] Date of Birth: 06/17/1986  Today's Date: 07/10/2020 PT Individual Time: 8101-7510 and 1230-1330 PT Individual Time Calculation (min): 9 min and 60 min   Short Term Goals: Week 4:  PT Short Term Goal 1 (Week 4): Patient will perform bed mobility with CGA without use of hospital bed functions. PT Short Term Goal 2 (Week 4): Patient will perform basic transfers with min A consistently. PT Short Term Goal 3 (Week 4): Patient will ambulate 100 ft using LRAD with min A. PT Short Term Goal 4 (Week 4): Patient will initiate stair training.  Skilled Therapeutic Interventions/Progress Updates:     Session 1: Patient in bed upon PT arrival. Patient reported 9-10/10 sacral pain during session, RN aware and provided Dilaudid prior to session. PT provided repositioning, rest breaks, and distraction as pain interventions throughout session. Patient requesting to stay in bed due to pain at this time. Discussed family education, family had not arrived yet, but planning to stay for PT session at 1pm. Discussed adjusting schedule for PT from 12:30-1:45 with patient's family for improved activity tolerance due to elevated pain levels and increased time for family education. Patient in agreement. Provided min A for repositioning on patient's R side and propped with pillows for comfort and pain control. Patient in bed and alternating mattress firmness with breaks locked and all needs in reach at end of session.   Session 2: Patient in bed with his mother, sister, and aunt in the room upon PT arrival. Patient alert and agreeable to PT session. Patient reported 5-6/10 sacral pain during session, RN made aware. PT provided repositioning, rest breaks, and distraction as pain interventions throughout session.   Discussed home set up, current level of mobility, and equipment needs with patient and his family. Recommending bariatric RW  with R hand splint and not recommending w/c at this time due to patient's progress with ambulation distance. [name] upset about patient not having a wheelchair, but stated understanding after demonstration of patient's functional mobility. [name] provided doorway (29" entry and bedroom, 22" bathroom) and bed height (22") measurements, but unable to provide stair height and couch dimensions that were previously requested to help simulate home set up prior to d/c.   [name] with significant anxiety about d/c throughout education, provided emotional support and education and answered questions as able or directed her to the correct resource (nurse, CSW, medical team).   Therapeutic Activity: Bed Mobility: Patient performed R side-lying to/from sit with supervision for safety with the bed flat, without rails, and with the mattress on max inflate. Required increased time and effort without assist with proper technique. Transfers: Patient performed sit to/from stand x2 and stand pivot x1 from 24" bed height or 23" w/c with supervision with and without RW. Provided verbal cues for safe guarding technique and use of folded blankets to elevate seat or bed height for reduced assist and caregiver burden with transfers.  Gait Training:  Patient ambulated 180 feet using bariatric RW with R hand splint with CGA for safety/balance. Ambulated with narrow BOS, increased toe out, decreased gait speed, step height, and step length, and good breath control throughout. Provided verbal cues for increased BOS and step height as safety risks for home mobility. Patient ascended/descended 7-3" steps using R rail, reaching far out in front due to only grab bar at patient's home with CGA. Performed step-to gait pattern leading with R while ascending forwards and  L while descending backwards for access to rail/grab bar at home. Provided cues for technique and safe guarding throughout. Patient's family  declined providing assist for patient. Educated patient's family that the patient has initiated stair training on 1-6" step, however, due to increased pain and anxiety with this patient opted to demonstrate on 3" steps today and will continue with progression on 6" steps tomorrow.    Following stair training, patient and his mother with significant anxiety and poor frustration tolerance with discussing mobility and d/c. Patient requested to return to the room to complete education.   Wheelchair Mobility:  Patient was transported in the w/c with total A back to the room for energy conservation and time management.  Once back in the room, dicussed car transfer and positioning on bed and couch using pillows and folded blankets, plan to place mattress on the floor in front of the couch for safety, as the patient plans to sleep here at night. [name] has a brief conversation with his aunt then both left the room without informing PT if or when they would return. Patient's sister instructed to inform the nurse when they return to perform dressing education. Patient asking to rest at this time. Stated that he feels confident in his ability to manage at home and needs to rest from increased pain and anxiety during family education. PT performed repositioning in the bed, demonstrating for patient's sister with patient providing instructions using teach-back method.   Patient in R side-lying in the bed with pillows placed for improved positioning with his sister in the room at end of session with breaks locked and all needs within reach. Patient missed 20 min of skilled PT due to pain/anxiety/fatigue, RN made aware. Will attempt to make-up missed time as able.    Therapy Documentation Precautions:  Precautions Precautions: Fall Precaution Comments: flaccid LUE (protect from subluxation); sling when OOB for comfort; skin integrity (sacral wound); monitor vitals Required Braces or Orthoses: Sling  Restrictions Weight Bearing Restrictions: No General: PT Amount of Missed Time (min): 20 Minutes PT Missed Treatment Reason: Pain;Patient fatigue   Therapy/Group: Individual Therapy  Lailana Shira L Keston Seever PT, DPT  07/10/2020, 6:13 PM

## 2020-07-10 NOTE — Consult Note (Signed)
WOC Nurse wound follow up Patient receiving care in [facility] 4W16. 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 Helmut Muster, RN, MSN, CWOCN, CNS-BC, pager [phone]

## 2020-07-10 NOTE — Progress Notes (Signed)
Physical Therapy Session Note  Patient Details  Name: Collin Brown MRN: [id number] Date of Birth: 08-27-1985  Today's Date: 07/10/2020 PT Individual Time: 2458-0998 PT Individual Time Calculation (min): 52 min   Short Term Goals: Week 4:  PT Short Term Goal 1 (Week 4): Patient will perform bed mobility with CGA without use of hospital bed functions. PT Short Term Goal 2 (Week 4): Patient will perform basic transfers with min A consistently. PT Short Term Goal 3 (Week 4): Patient will ambulate 100 ft using LRAD with min A. PT Short Term Goal 4 (Week 4): Patient will initiate stair training.  Skilled Therapeutic Interventions/Progress Updates:    Pt received R sidelying in bed and agreeable to therapy session though reporting increased sacral pain (unrated but no medications available at this time - details below). Pt reports he is tired from family training earlier today and states that his mother had increased anxiety with his current mobility level; however, pt reports that he feels he is doing really well and that she will need some additional time to see and learn how well he is doing. Supine>sitting R EOB, HOB flat and air mattress set to static full firmness, with supervision. Sitting EOB donned B shoes max assist for time management. Pt reports need to use bathroom. Sit>stand EOB>RW with CGA for steadying and pt requiring increased time to come to standing due to increased sacral pain. Attempted standing use of urinal but pt reporting increased back pain and fatigue while standing requiring return to sitting then to R sidelying due to increased sacral pain. While in R sidelying, pt able to void in urinal with set-up assist. Pt agreeable to progression to ambulation. Supine>sitting R EOB, supervision as above. Sit>stand EOB>RW with L hand orthosis with CGA as described above. Gait training ~460ft using RW with CGA for steadying throughout - pt continues to demo narrow BOS, increased B LE foot  supination, and decreased step length  - demos slight increased instability during turning due to narrow BOS with cuing for slower, controlled movements - +2 w/c follow in event of fatigue but not needed. Upon return to room assessed HR 136bpm on dynamap with 105bpm manual assessment and SpO2 95% on RA. Pt states "I gave that walk all I had" and requests to return to bed. Sit>supine supervision with increased time for B LE management onto bed. Therapist assisted pt with therapeutically repositioning in R sidelying using pillows for pressure relief, readjusted air mattress features back to promote pressure relief and left with needs in reach.  Therapy Documentation Precautions:  Precautions Precautions: Fall Precaution Comments: flaccid LUE (protect from subluxation); sling when OOB for comfort; skin integrity (sacral wound); monitor vitals Required Braces or Orthoses: Sling Restrictions Weight Bearing Restrictions: No  Pain:   Reports increased sacral pain, unrated, with no medications due at this time - provided pt increased time to transition to/from different positions for pain management and pt returned to R sidelying 1x during session as this is the best position of pain relief.   Therapy/Group: Individual Therapy  Ginny Forth , PT, DPT, CSRS  07/10/2020, 12:50 PM

## 2020-07-10 NOTE — Progress Notes (Signed)
Occupational Therapy Session Note  Patient Details  Name: Collin Brown MRN: [id number] Date of Birth: 1986-04-03  Today's Date: 07/10/2020 OT Individual Time: 1100-1152 OT Individual Time Calculation (min): 52 min  and Today's Date: 07/10/2020 OT Missed Time: 8 Minutes Missed Time Reason: Patient fatigue   Short Term Goals: Week 5:  OT Short Term Goal 1 (Week 5): Continue working toward established LTG  Skilled Therapeutic Interventions/Progress Updates:    Pt received supine with mom, aunt, and sister present for family education. Focus on ADLs at home, progress, CLOF, and AD to use in the home. Pt completed bed mobility with supervision, directing set up of air mattress. Pt sat EOB for ADLs with pain gradually increasing in his sacrum eventually requiring a sidelying rest break. Pt demonstrated UB bathing and dressing with min A for donning shirt and supervision for bathing, good carryover of hemi technique. Discussed possible use of a reacher during LB dressing when pt's mother stormed out of the room and forcefully closed the door. Family reporting she is anxious re d/c. She did miss the remainder of the session. Pt donned pants with mod A overall, 2/2 pain and fatigue increasing. Discussed use of BSC and TTB at home, but advised to wait on showers for MD approval with sacral wound management. Pt was positioned in sidelying and HEP for the LUE was reviewed. Pt was left supine, last 8 min missed 2/2 fatigue. No further questions from family.   Therapy Documentation Precautions:  Precautions Precautions: Fall Precaution Comments: flaccid LUE (protect from subluxation); sling when OOB for comfort; skin integrity (sacral wound); monitor vitals Required Braces or Orthoses: Sling Restrictions Weight Bearing Restrictions: No  Therapy/Group: Individual Therapy  Crissie Reese 07/10/2020, 6:53 AM

## 2020-07-10 NOTE — Progress Notes (Signed)
[name] and sister trained to change the dressing on the pressure injury.

## 2020-07-10 NOTE — Patient Care Conference (Signed)
Inpatient RehabilitationTeam Conference and Plan of Care Update Date: 07/10/2020   Time: 10:16 AM    Patient Name: Collin Brown      Medical Record Number: [id number]  Date of Birth: 18-May-1986 Sex: Male         Room/Bed: 4W16C/4W16C-01 Payor Info: Payor: [employer]UE [employer]UE [employer] / Plan: BCBS COMM PPO / Product Type: *No Product type* /    Admit Date/Time:  06/08/2020 12:01 PM  Primary Diagnosis:  Critical illness myopathy  Hospital Problems: Principal Problem:   Critical illness myopathy Active Problems:   Debility   Essential hypertension   Diabetes mellitus, new onset (HCC)   Brachial plexopathy   Sepsis due to undetermined organism (HCC)   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 Sula Soda Care Coodinator Present: Cecile Sheerer, LCSWA;Buford Bremer [name], RN, BSN, CRRN Nurse Present: Keturah Barre, RN PT Present: Serina Cowper, PT OT Present: Jake Shark, OT PPS Coordinator present : Edson Snowball, Park Breed, SLP     Current Status/Progress Goal Weekly Team Focus  Bowel/Bladder   constipated but continent of bowel and bladder  adress opioid constipation  Assess toileting needs q2 and PRN   Swallow/Nutrition/ Hydration             ADL's   Improvement in LUE shoulder activation, min A UB ADLs, min A LB ADLs, min A toileting, CGA transfers  CGA to min A for UB tasks  Pain management, ADL transfers and retraining, family education, d/c planning   Mobility   Supervision bed mobility and transfers, CGA gait >300 ft using RW with L hand splint due to increased L arm strength, min A 1-6" step with R rail  Supervision-CGA overall, min A 4 stairs and car  Family education, d/c planning, stair training, gait training, activity tolerance, L  upper extremity NMR/ROM, balance, patient/caregiver education   Communication             Safety/Cognition/ Behavioral Observations            Pain   Continuous pain to buttock with increased pain during dressing changes  pain level <6/10  Assess pain q4hrs and PRN   Skin   sacral unstageable wound with hydrotherapy and Twice daily moist to dry dressing applied between hydrotherapy treatments  remain free of new skin breakdown and infection  assess wounds and skin qshift and report chan MD     Discharge Planning:  D/c to home with support from his mother (supervision) and his sister [name]. Fam edu completed on 11/15 1pm-3pm with mother/Connie. Fam edu again on 11/30 9:30a-3pm. Family to be present for hydrotherapy. Pt mother now has no transportation to get pt home. No HHA in place at this time due to insurance.   Team Discussion: Continent B/B with incontinent episodes. Family coming in for family education. Continues to have to buttocks and and sacral area due to unstageable wound. Wound being treated by Hydrotherapy. OT reports patient has made good progress this past week, min assist with ASL's, min assist with toileting, contact guard for bed mobility. PT reports patient has made good progress this past week, ambulated >300' and climbed 3-3"steps. Patient on target to meet rehab goals: yes  *See Care Plan and progress notes for long and short-term goals.   Revisions to Treatment Plan:  Not at this time.  Teaching Needs: Continue with family education and wound care education.  Current Barriers to Discharge: Inaccessible home environment, Decreased caregiver support, Home enviroment access/layout, Wound care, Lack of/limited family support, Weight and pain.  Possible Resolutions to Barriers: Continue with current medication, continue education on wound care and dressing changes, continue family education, provide emotional support to patient and family.     Medical Summary  Current Status: Continues to complain of tailbone pain and left arm pain, left arm strength has been improving and he has been using less prn oxycodone  Barriers to Discharge: Medical stability;Wound care  Barriers to Discharge Comments: Pain continues to be significant secondary to severe pressure injury, opioid induced constipation Possible Resolutions to [employer] Focus: continue current pain regimen, stool softeners, continue hydrotherapy to pressure injury   Continued Need for Acute Rehabilitation Level of Care: The patient requires daily medical management by a physician with specialized training in physical medicine and rehabilitation for the following reasons: Direction of a multidisciplinary physical rehabilitation program to maximize functional independence : Yes Medical management of patient stability for increased activity during participation in an intensive rehabilitation regime.: Yes Analysis of laboratory values and/or radiology reports with any subsequent need for medication adjustment and/or medical intervention. : Yes   I attest that I was present, lead the team conference, and concur with the assessment and plan of the team.   Kennyth Arnold G 07/10/2020, 1:00 PM

## 2020-07-10 NOTE — Progress Notes (Signed)
[place] PHYSICAL MEDICINE & REHABILITATION PROGRESS NOTE   Subjective/Complaints: Continues to complain of tailbone pain, but has demonstrated improved participation with therapy, ambulating >1000 feet yesterday!  Continues to require Dilaudid for hydrotherapy  ROS: Denies CP, SOB, N/V/D   No results found. Recent Labs    07/09/20 0758  WBC 7.5  HGB 11.5*  HCT 37.0*  PLT 339   No results for input(s): NA, K, CL, CO2, GLUCOSE, BUN, CREATININE, CALCIUM in the last 72 hours.  Intake/Output Summary (Last 24 hours) at 07/10/2020 1024 Last data filed at 07/10/2020 0815 Gross per 24 hour  Intake 480 ml  Output 1400 ml  Net -920 ml     Pressure Injury 06/08/20 Coccyx Unstageable - Full thickness tissue loss in which the base of the injury is covered by slough (yellow, tan, gray, green or brown) and/or eschar (tan, brown or black) in the wound bed. yellow necrotic tissue in the crease o (Active)  06/08/20 1830 (with Lizabeth Leyden RN)  Location: Coccyx  Location Orientation:   Staging: Unstageable - Full thickness tissue loss in which the base of the injury is covered by slough (yellow, tan, gray, green or brown) and/or eschar (tan, brown or black) in the wound bed.  Wound Description (Comments): yellow necrotic tissue in the crease originally put in as a nonpressure wound  Present on Admission: Yes    Physical Exam: Vital Signs Blood pressure 136/86, pulse 88, temperature 97.7 F (36.5 C), temperature source Oral, resp. rate 20, height 5\' 10"  (1.778 m), weight (!) 154.7 kg, SpO2 97 %. Gen: no distress, normal appearing HEENT: oral mucosa pink and moist, NCAT Cardio: Reg rate Chest: normal effort, normal rate of breathing Abd: soft, non-distended Ext: no edema Skin: Warm and dry. 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  Psych: Flat.  Normal behavior. Musc: No edema in extremities.  No tenderness in extremities. Neuro: Alert Motor: LUE:  1+/5 proximal distal, unchanged RUE: 4/5 proximal distal Bilateral lower extremities: 4-4+/5 proximal to distal   Assessment/Plan: 1. 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  Physiatrist is providing close team supervision and 24 hour management of active medical problems listed below.  Physiatrist and rehab team continue to assess barriers to discharge/monitor patient progress toward functional and medical goals  Care Tool:  Bathing    Body parts bathed by patient: Left arm, Chest, Abdomen, Face   Body parts bathed by helper: Right arm, Right lower leg, Left lower leg     Bathing assist Assist Level: Moderate Assistance - Patient 50 - 74%     Upper Body Dressing/Undressing Upper body dressing   What is the patient wearing?: Hospital gown only    Upper body assist Assist Level: Minimal Assistance - Patient > 75%    Lower Body Dressing/Undressing Lower body dressing      What is the patient wearing?: Pants     Lower body assist Assist for lower body dressing: Maximal Assistance - Patient 25 - 49%     Toileting Toileting    Toileting assist Assist for toileting: Moderate Assistance - Patient 50 - 74%     Transfers Chair/bed transfer  Transfers assist  Chair/bed transfer activity did not occur: Safety/medical concerns  Chair/bed transfer assist level: Supervision/Verbal cueing Chair/bed transfer assistive device:   Ambulation assist   Ambulation activity did not occur: Safety/medical concerns  Assist  level: Supervision/Verbal cueing Assistive device: Walker-platform Max distance: 180 ft   Walk 10 feet activity   Assist  Walk 10 feet activity did not occur: Safety/medical concerns  Assist level: Supervision/Verbal cueing Assistive device: Walker-platform   Walk 50 feet activity   Assist Walk  50 feet with 2 turns activity did not occur: Safety/medical concerns (did not perform turns)  Assist level: Supervision/Verbal cueing Assistive device: Walker-platform    Walk 150 feet activity   Assist Walk 150 feet activity did not occur: Safety/medical concerns  Assist level: Supervision/Verbal cueing Assistive device: Walker-platform    Walk 10 feet on uneven surface  activity   Assist Walk 10 feet on uneven surfaces activity did not occur: Safety/medical concerns         Wheelchair     Assist Will patient use wheelchair at discharge?: No   Wheelchair activity did not occur: Safety/medical concerns         Wheelchair 50 feet with 2 turns activity    Assist    Wheelchair 50 feet with 2 turns activity did not occur: Safety/medical concerns       Wheelchair 150 feet activity     Assist  Wheelchair 150 feet activity did not occur: Safety/medical concerns       Blood pressure 136/86, pulse 88, temperature 97.7 F (36.5 C), temperature source Oral, resp. rate 20, height 5\' 10"  (1.778 m), weight (!) 154.7 kg, SpO2 97 %.  Medical Problem List and Plan: 1.  CIM with?  Brachial plexopathy secondary to acute hypoxic respiratory failure secondary to COVID-19 pneumonia  Patient is no longer on isolation  Continue CIR  -Interdisciplinary Team Conference today  2.  Antithrombotics: -DVT/anticoagulation: Lovenox 80 mg daily per surgery             -antiplatelet therapy: Aspirin 81 mg daily 3. Pain Management: sacrum/LUE  11/12: continue gabapentin, kpad   -dilaudid for more severe pain  Oxycontin to 20mg  q12 to decrease dependence on PRN's    -reduced prn dilaudid to 2mg  q12 prn  11/30: Has been decreasing use of prns.  4. Mood: pt needs regular ego support, falling into some dependent personality patterns             -antipsychotic agents: N/A 5. Neuropsych: This patient is capable of making decisions on his own behalf. 6. 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 Fluids/Electrolytes/Nutrition: encourage po 8.  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  Patient completed 5-day course Solu-Medrol             Supportive care with range of motion             Recommend NCV/EMG as outpatient.  -gabapentin as above  WHO ordered again 9.  New onset diabetes mellitus, hemoglobin A1c 7.2.  NovoLog 4 units 3 times daily, Levemir 15 units twice daily.               CBG (last 3)  Recent Labs    07/09/20 1628 07/09/20 2122 07/10/20 0550  GLUCAP 95 131* 102*   Elevated 11/30 to 131: continue to monitor.  10.  Hypertension with tachycardia.  Lopressor 50 mg twice daily.    Blood pressure controlled on 11/28  Borderline tachycardia on 11/28 11.  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  Dietary follow-up  11/14- BMI down to 49.63 12. Fever, UCX with 100 Klebsiella: Resolved  -stool sample also positive for salmonella  -chest xray and exam unremarkable  -blood cultures negative  -abx narrowed to augmentin which completed 11/11 13. Hypokalemia:   Potassium 4.0 on 11/26  Continue supplementation  14.  Drug-induced constipation  On miralax and senna-s bid  Linzess started on 11/23, increased on 11/27, changed to Movantik on 11/28 15.  Acute blood loss anemia  Hemoglobin 11.5 on 11/21 and 11/29  Continue to monitor    LOS: 32 days A FACE TO FACE EVALUATION WAS PERFORMED  Elfa Wooton P Cilicia Borden 07/10/2020, 10:24 AM

## 2020-07-11 ENCOUNTER — Encounter (HOSPITAL_COMMUNITY): Payer: Self-pay | Admitting: Physical Medicine & Rehabilitation

## 2020-07-11 ENCOUNTER — Inpatient Hospital Stay (HOSPITAL_COMMUNITY): Payer: BC Managed Care – PPO

## 2020-07-11 LAB — GLUCOSE, CAPILLARY
Glucose-Capillary: 102 mg/dL — ABNORMAL HIGH (ref 70–99)
Glucose-Capillary: 131 mg/dL — ABNORMAL HIGH (ref 70–99)
Glucose-Capillary: 128 mg/dL — ABNORMAL HIGH (ref 70–99)
Glucose-Capillary: 109 mg/dL — ABNORMAL HIGH (ref 70–99)

## 2020-07-11 NOTE — Progress Notes (Signed)
Recreational Therapy Session Note  Patient Details  Name: CORBIN FALCK MRN: [id number] Date of Birth: [birth year] Today's Date: 07/11/2020  Pain: c/o intermittent pain with transitional movements, unrated, premedicated, repositioning assisted with relief Skilled Therapeutic Interventions/Progress Updates: Session focused on discharge planning, stair training and community ambulation during cotreat with PT.  Pt ambulated from his room to the therapy gym with RW and min assist.  After seated rest break, pt ascended and descended (descending backwards) 3 steps with R rail.  After debriefing, pt stated he did not have a railing to hold to but a post that supported his porch instead.  Requested pt have his mother send a picture of the entrance via email/text so that therapist could determine a safe way to enter his home.  Pt taken outside w/c level total assist for enegy and time management.  Outside, pt ambulated ~250' with RW on uneven surfaces with min assist, min cues for safety specifically with turning.  Therapy/Group: Co-Treatment   Eleftheria Taborn 07/11/2020, 3:52 PM

## 2020-07-11 NOTE — Progress Notes (Signed)
   07/11/20 2006  Assess: MEWS Score  Temp 98.2 F (36.8 C)  BP 137/87  Pulse Rate (!) 116  Resp 19  Level of Consciousness Alert  SpO2 93 %  O2 Device Room Air  Assess: MEWS Score  MEWS Temp 0  MEWS Systolic 0  MEWS Pulse 2  MEWS RR 0  MEWS LOC 0  MEWS Score 2  MEWS Score Color Yellow  Assess: if the MEWS score is Yellow or Red  Were vital signs taken at a resting state? Yes  Focused Assessment No change from prior assessment  Early Detection of Sepsis Score *See Row Information* Low  MEWS guidelines implemented *See Row Information* Yes  Treat  MEWS Interventions Administered scheduled meds/treatments;Administered prn meds/treatments  Pain Scale 0-10  Pain Score 8  Pain Type Acute pain  Pain Location Scrotum  Pain Descriptors / Indicators Aching  Pain Frequency Constant  Pain Onset On-going  Patients Stated Pain Goal 2  Pain Intervention(s) Medication (See eMAR) (requested to take only tylenol)  Escalate  MEWS: Escalate Yellow: discuss with charge nurse/RN and consider discussing with provider and RRT  Notify: Charge Nurse/RN  Name of Charge Nurse/RN Notified Fannie Knee  Date Charge Nurse/RN Notified 07/11/20  Time Charge Nurse/RN Notified 2010  Document  Patient Outcome Stabilized after interventions  Progress note created (see row info) Yes   Patient states pulse elevates when pain is increased. Patient was medicated for pain.  HR currently 97.

## 2020-07-11 NOTE — Progress Notes (Signed)
Physical Therapy Session Note  Patient Details  Name: Collin Brown MRN: [id number] Date of Birth: [birth year]  Today's Date: 07/11/2020 PT Individual Time: 0900-1000 and 1420-1525 PT Individual Time Calculation (min): 60 min   Short Term Goals: Week 4:  PT Short Term Goal 1 (Week 4): Patient will perform bed mobility with CGA without use of hospital bed functions. PT Short Term Goal 2 (Week 4): Patient will perform basic transfers with min A consistently. PT Short Term Goal 3 (Week 4): Patient will ambulate 100 ft using LRAD with min A. PT Short Term Goal 4 (Week 4): Patient will initiate stair training.  Skilled Therapeutic Interventions/Progress Updates:     Session 2: Patient in R side-lying in the bed upon PT arrival. Patient alert and agreeable to PT session. Patient reported 6-7/10 sacral pain during session, RN made aware. PT provided repositioning, rest breaks, and distraction as pain interventions throughout session.  Therapeutic Activity: Bed Mobility: Patient performed supine to/from sit with supervision for safety. Transfers: Patient performed sit to/from stand x2 with CGA using RW with L hand splint. Provided verbal cues for forward weight shift and self assist for strapping his L hand into the hand splint. Patient performed a simulated midsize SUV height car transfer with 6" step simulating running board using RW with CGA-min A. Provided cues and demonstration for safe technique for propping on the seat then placing his feet on the running board to boost into the seat. When attempting to boost up the step shifted forward ~1" and the patient displayed severe anxiety of falling, despite sitting safely on the car seat, and was assisted to standing with min A-CGA, at which time he reported being incontinent of bowl. Patient assisted to the w/c and transported back to the room for toileting.  He performed a toilet transfer to rolling shower chair/BSC with CGA without AD. He was  incontinent/continent of bowl during toileting  Therapeutic Exercise: Patient performed the following exercises with verbal and tactile cues for proper technique. Patient ambulated >250 feet using RW with L hand splint with CGA for safety and +2 w/c follow due to decreased activity tolerance. Provided verbal cues for paced breathing and paced stepping for improved activity tolerance.  Educated on coping strategies for anxiety with mobility and performing car transfer in his friends vehicle with therapy staff on Friday at d/c. Patient receptive and in agreement.   Patient in R side-lying in the bed handed off to PT for hydrotherapy at end of session with breaks locked and all needs within reach.   Session 2: Patient in R side-lying in the bed upon PT arrival. Patient alert and agreeable to PT session. Patient reported 6-8/10 sacral pain during session, RN made aware. PT provided repositioning, rest breaks, and distraction as pain interventions throughout session.   [name], RT, present for co-treat throughout session.   Therapeutic Activity: Bed Mobility: Patient performed supine to/from sit with supervision with bed on max inflate, initially patient too close to the edge and he returned to side-lying to shift back on the bed before returning to sitting. Required increased time and effort with all mobility, but demonstrated safe technique and safety awareness. Transfers: Patient performed sit to/from stand x4 and stand pivot x1 with CGA using RW with L hand splint. Provided verbal cues for forward weight shift and self placement of L hand into splint.  Gait Training:  Patient ambulated >150 feet over level tile and >200 feet and 50 feet over unlevel sidewalk using RW with  L hand splint with CGA and min A x1 when turning over unlevel sidewalk for AD management on the L for safety. Ambulated with step through gait pattern with narrow BOS, increased supination of B feet, increased B toe out, and  decreased control of RW on L due to L upper extremity weakness over sidewalk. Provided verbal cues for paced breathing, pacing himself for improved activity tolerance, and RW management and safety outside.  Patient ascended/descended 3-6" steps using R rail with min A-CGA. Performed step-to gait pattern leading with R while ascending forwards and L while descending backwards. Provided cues and demonstration for technique and sequencing. After debriefing, pt stated he did not have a railing to hold to but a post that supported his porch instead.  Requested pt have his mother send a picture of the entrance via email/text so that therapist could determine a safe way to enter his home.  Wheelchair Mobility:  Patient was transported in the w/c with total A to/from the courtyard outside th [facility] for energy conservation and time management.  Patient in R side-lying in the bed with pillows placed for postitioning at end of session with breaks locked and all needs within reach.    Therapy Documentation Precautions:  Precautions Precautions: Fall Precaution Comments: flaccid LUE (protect from subluxation); sling when OOB for comfort; skin integrity (sacral wound); monitor vitals Required Braces or Orthoses: Sling Restrictions Weight Bearing Restrictions: No   Therapy/Group: Individual Therapy and Co-Treatment with Recreational Therapy  Zyanya Glaza L Lexus Shampine PT, DPT  07/11/2020, 4:22 PM

## 2020-07-11 NOTE — Progress Notes (Addendum)
Physical Therapy Wound Treatment and Discharge Patient Details  Name: Collin Brown MRN: [id number] Date of Birth: 10-06-1985  Today's Date: 07/11/2020 PT Individual Time: 1000-1040 PT Individual Time Calculation (min): 40 min   Subjective  Subjective: Complains of pain from PT session immediately before session.  Patient and Family Stated Goals: Heal wound - d/c Friday Date of Onset:  (Unknown) Prior Treatments: dressing changes  Pain Score: Faces pain scale 8/10 - RN aware  Wound Assessment  Pressure Injury 06/08/20 Coccyx Unstageable - Full thickness tissue loss in which the base of the injury is covered by slough (yellow, tan, gray, green or brown) and/or eschar (tan, brown or black) in the wound bed. yellow necrotic tissue in the crease o (Active)  Wound Image   07/11/20 1422  Dressing Type ABD;Barrier Film (skin prep);Foam - Lift dressing to assess site every shift;Moist to dry 07/11/20 1422  Dressing Changed;Clean;Dry;Intact 07/11/20 1422  Dressing Change Frequency Daily 07/11/20 1422  State of Healing Early/partial granulation 07/11/20 1422  Site / Wound Assessment Painful;Red;Yellow 07/11/20 1422  % Wound base Red or Granulating 90% 07/11/20 1422  % Wound base Yellow/Fibrinous Exudate 5% 07/11/20 1422  % Wound base Black/Eschar 0% 07/11/20 1422  % Wound base Other/Granulation Tissue (Comment) 5% 07/11/20 1422  Peri-wound Assessment Intact 07/11/20 1422  Wound Length (cm) 3 cm 07/11/20 1031  Wound Width (cm) 4 cm 07/11/20 1031  Wound Depth (cm) 7.5 cm 07/11/20 1031  Wound Surface Area (cm^2) 12 cm^2 07/11/20 1031  Wound Volume (cm^3) 90 cm^3 07/11/20 1031  Tunneling (cm) 2.8 at 5:00,  07/11/20 1031  Undermining (cm) - 07/06/20 1041  Margins Unattached edges (unapproximated) 07/11/20 1422  Drainage Amount Minimal 07/11/20 1422  Drainage Description Serosanguineous 07/11/20 1422  Treatment Hydrotherapy (Pulse lavage);Packing (Saline gauze) 07/11/20 1422   Santyl applied  to wound bed prior to applying dressing.     Hydrotherapy Pulsed lavage therapy - wound location: sacrum Pulsed Lavage with Suction (psi): 12 psi Pulsed Lavage with Suction - Normal Saline Used: 1000 mL Pulsed Lavage Tip: Tip with splash shield   Wound Assessment and Plan  Wound Therapy - Assess/Plan/Recommendations Wound Therapy - Clinical Statement: Overall wound is 95% clean with minimal residual yellow unviable tissue able to be visualized. Pt very painful this session and focus was mainly pulse lavage, RN education and pt education on dressing change.  Hydrotherapy to sign off today as wound is appropriate for dressing changes by nursing staff until d/c. If needs change, please reconsult.  Wound Therapy - Functional Problem List: decreased mobility Factors Delaying/Impairing Wound Healing: Immobility Hydrotherapy Plan: Debridement;Patient/family education;Pulsatile lavage with suction;Dressing change Wound Therapy - Frequency: 6X / week Wound Therapy - Follow Up Recommendations: Home health RN Wound Plan: see above  Wound Therapy Goals- Improve the function of patient's integumentary system by progressing the wound(s) through the phases of wound healing (inflammation - proliferation - remodeling) by: Decrease Necrotic Tissue to: 0 Decrease Necrotic Tissue - Progress: Partly met Increase Granulation Tissue to: 100 Increase Granulation Tissue - Progress: Partly met Goals/treatment plan/discharge plan were made with and agreed upon by patient/family: Yes Time For Goal Achievement: 7 days Wound Therapy - Potential for Goals: Good  Goals will be updated until maximal potential achieved or discharge criteria met.  Discharge criteria: when goals achieved, discharge from hospital, MD decision/surgical intervention, no progress towards goals, refusal/missing three consecutive treatments without notification or medical reason.  GP     [name] 07/11/2020, 2:34 PM   Mickel Baas  Rich Reining, PT, DPT Acute Rehabilitation Services Pager: [phone] Office: [phone]

## 2020-07-11 NOTE — Progress Notes (Signed)
[facility] PHYSICAL MEDICINE & REHABILITATION PROGRESS NOTE   Subjective/Complaints: Continues to have tailbone pain but has been able to use less prns.  Happy that a home health agency has bene found.   ROS: Denies CP, SOB, N/V/D   No results found. Recent Labs    07/09/20 0758  WBC 7.5  HGB 11.5*  HCT 37.0*  PLT 339   No results for input(s): NA, K, CL, CO2, GLUCOSE, BUN, CREATININE, CALCIUM in the last 72 hours.  Intake/Output Summary (Last 24 hours) at 07/11/2020 1630 Last data filed at 07/11/2020 1321 Gross per 24 hour  Intake 600 ml  Output 1325 ml  Net -725 ml     Pressure Injury 06/08/20 Coccyx Unstageable - Full thickness tissue loss in which the base of the injury is covered by slough (yellow, tan, gray, green or brown) and/or eschar (tan, brown or black) in the wound bed. yellow necrotic tissue in the crease o (Active)  06/08/20 1830 (with Lizabeth Leyden RN)  Location: Coccyx  Location Orientation:   Staging: Unstageable - Full thickness tissue loss in which the base of the injury is covered by slough (yellow, tan, gray, green or brown) and/or eschar (tan, brown or black) in the wound bed.  Wound Description (Comments): yellow necrotic tissue in the crease originally put in as a nonpressure wound  Present on Admission: Yes    Physical Exam: Vital Signs Blood pressure 132/80, pulse (!) 109, temperature 97.9 F (36.6 C), temperature source Oral, resp. rate 14, height 5\' 10"  (1.778 m), weight (!) 154.7 kg, SpO2 97 %. Gen: no distress, normal appearing HEENT: oral mucosa pink and moist, NCAT Cardio: Tachycardic Chest: normal effort, normal rate of breathing Abd: soft, non-distended Ext: no edema Skin: Warm and dry. 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  Psych: Flat.  Normal behavior. Musc: No edema in extremities.  No tenderness in extremities. Neuro: Alert Motor: LUE: 1+/5 proximal distal, unchanged RUE: 4/5 proximal  distal Bilateral lower extremities: 4-4+/5 proximal to distal   Assessment/Plan: 1. 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  Physiatrist is providing close team supervision and 24 hour management of active medical problems listed below.  Physiatrist and rehab team continue to assess barriers to discharge/monitor patient progress toward functional and medical goals  Care Tool:  Bathing    Body parts bathed by patient: Left arm, Chest, Abdomen, Face   Body parts bathed by helper: Right arm, Right lower leg, Left lower leg     Bathing assist Assist Level: Moderate Assistance - Patient 50 - 74%     Upper Body Dressing/Undressing Upper body dressing   What is the patient wearing?: Hospital gown only    Upper body assist Assist Level: Minimal Assistance - Patient > 75%    Lower Body Dressing/Undressing Lower body dressing      What is the patient wearing?: Pants     Lower body assist Assist for lower body dressing: Maximal Assistance - Patient 25 - 49%     Toileting Toileting    Toileting assist Assist for toileting: Moderate Assistance - Patient 50 - 74%     Transfers Chair/bed transfer  Transfers assist  Chair/bed transfer activity did not occur: Safety/medical concerns  Chair/bed transfer assist level: Contact Guard/Touching assist Chair/bed transfer assistive device:   Ambulation assist   Ambulation activity did not occur: Safety/medical concerns  Assist level: Contact Guard/Touching assist Assistive device: Walker-rolling Max distance: >250 ft   Walk 10 feet activity   Assist  Walk 10 feet activity did not occur: Safety/medical concerns  Assist level: Contact Guard/Touching assist Assistive device: Walker-rolling   Walk 50 feet activity   Assist Walk 50 feet with 2 turns activity did not  occur: Safety/medical concerns (did not perform turns)  Assist level: Contact Guard/Touching assist Assistive device: Walker-rolling    Walk 150 feet activity   Assist Walk 150 feet activity did not occur: Safety/medical concerns  Assist level: Contact Guard/Touching assist Assistive device: Walker-rolling    Walk 10 feet on uneven surface  activity   Assist Walk 10 feet on uneven surfaces activity did not occur: Safety/medical concerns   Assist level: Contact Guard/Touching assist Assistive device: Photographer Will patient use wheelchair at discharge?: No   Wheelchair activity did not occur: Safety/medical concerns         Wheelchair 50 feet with 2 turns activity    Assist    Wheelchair 50 feet with 2 turns activity did not occur: Safety/medical concerns       Wheelchair 150 feet activity     Assist  Wheelchair 150 feet activity did not occur: Safety/medical concerns       Blood pressure 132/80, pulse (!) 109, temperature 97.9 F (36.6 C), temperature source Oral, resp. rate 14, height 5\' 10"  (1.778 m), weight (!) 154.7 kg, SpO2 97 %.  Medical Problem List and Plan: 1.  CIM with?  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  Antithrombotics: -DVT/anticoagulation: Lovenox 80 mg daily per surgery             -antiplatelet therapy: Aspirin 81 mg daily 3. Pain Management: sacrum/LUE  11/12: continue gabapentin, kpad   -dilaudid for more severe pain  Oxycontin to 20mg  q12 to decrease dependence on PRN's    -reduced prn dilaudid to 2mg  q12 prn  11/30-12/1: Has been decreasing use of prns. Commended him on this.   4. Mood: pt needs regular ego support, falling into some dependent personality patterns             -antipsychotic agents: N/A 5. Neuropsych: This  patient is capable of making decisions on his own behalf. 6. 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 Fluids/Electrolytes/Nutrition: encourage po 8.  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  Patient completed 5-day course Solu-Medrol             Supportive care with range of motion             Recommend NCV/EMG as outpatient.  -gabapentin as above  WHO ordered again 9.  New onset diabetes mellitus, hemoglobin A1c 7.2.  NovoLog 4 units 3 times daily, Levemir 15 units twice daily.               CBG (last 3)  Recent Labs    07/11/20 0645 07/11/20 1133 07/11/20 1623  GLUCAP 102* 131* 128*   Elevated 12/1 to 131: continue to monitor.  10.  Hypertension with tachycardia.  Lopressor 50 mg twice daily.    Blood  pressure controlled on 11/28  Borderline tachycardia on 11/28 11.  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  Dietary follow-up  11/14- BMI down to 49.63 12. Fever, UCX with 100 Klebsiella: Resolved  -stool sample also positive for salmonella  -chest xray and exam unremarkable  -blood cultures negative  -abx narrowed to augmentin which completed 11/11 13. Hypokalemia:   Potassium 4.0 on 11/26  Continue supplementation  14.  Drug-induced constipation  On miralax and senna-s bid  Linzess started on 11/23, increased on 11/27, changed to Movantik on 11/28 15.  Acute blood loss anemia  Hemoglobin 11.5 on 11/21 and 11/29  Continue to monitor    LOS: 33 days A FACE TO FACE EVALUATION WAS PERFORMED  Falecia Vannatter P Kehlani Vancamp 07/11/2020, 4:30 PM

## 2020-07-11 NOTE — Progress Notes (Signed)
Patient ID: Collin Brown, male   DOB: February 20, 1986, 34 y.o.   MRN: [id number] SW completed [employer] ([phone] application for pt. 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 Sw also mailed application.   SW met with pt in room to provide updates on HHA being [place]. SW provided pt with transportation resources.   Loralee Pacas, MSW, [place] Office: [phone] Cell: [phone] Fax: [phone]

## 2020-07-11 NOTE — Progress Notes (Signed)
Occupational Therapy Session Note  Patient Details  Name: Collin Brown MRN: [id number] Date of Birth: 11/22/1985  Today's Date: 07/11/2020 OT Individual Time: 1100-1157 OT Individual Time Calculation (min): 57 min    Short Term Goals: Week 1:  OT Short Term Goal 1 (Week 1): Pt will complete sit<>stand with mod assist +2 in preperation for LB ADLs OT Short Term Goal 1 - Progress (Week 1): Met OT Short Term Goal 2 (Week 1): Pt will achieve full PROM elbow flexion with minimal to no pain. OT Short Term Goal 2 - Progress (Week 1): Progressing toward goal OT Short Term Goal 3 (Week 1): Pt will complete UB dressing with mod assist. OT Short Term Goal 3 - Progress (Week 1): Met OT Short Term Goal 4 (Week 1): Pt will complete UB bathing using long handled sponge as needed with min assist. OT Short Term Goal 4 - Progress (Week 1): Met  Skilled Therapeutic Interventions/Progress Updates:    1:1. Pt received in bed agreeable to OT. Pt reporting 7/10 pain in sacrum requesting to work on sidelying prior to moving to EOB. Pt completes UE ranger exercises with LUE with significantly increased control compared to last session completing this activity with no A needed for tricep activation. Pt sits EOB with S and dons pants with MIN A to advance past L hip in standing. Pt requires min VC for AE technique and prolonged rest break on pts side. Exited session with pt seated in bed, exit alarm on and call light in reach   Therapy Documentation Precautions:  Precautions Precautions: Fall Precaution Comments: flaccid LUE (protect from subluxation); sling when OOB for comfort; skin integrity (sacral wound); monitor vitals Required Braces or Orthoses: Sling Restrictions Weight Bearing Restrictions: No General:   Vital Signs: Therapy Vitals Temp: (!) 97.5 F (36.4 C) Pulse Rate: 86 Resp: 18 BP: 127/83 Patient Position (if appropriate): Lying Oxygen Therapy SpO2: 97 % O2 Device: Room  Air Pain: Pain Assessment Pain Scale: 0-10 Pain Score: Asleep Pain Type: Acute pain Pain Location: Sacrum Pain Orientation: Medial Pain Descriptors / Indicators: Aching;Constant Pain Frequency: Constant Pain Onset: On-going Patients Stated Pain Goal: 2 Pain Intervention(s): Medication (See eMAR) ADL: ADL Upper Body Bathing: Maximal assistance Where Assessed-Upper Body Bathing: Edge of bed Lower Body Bathing: Other (comment) (+2 assist) Where Assessed-Lower Body Bathing: Edge of bed, Bed level (attempted to wash buttocks but quickly fatigued; therefore completed at bed level sidelying) Upper Body Dressing: Maximal assistance Where Assessed-Upper Body Dressing: Edge of bed Lower Body Dressing: Other (Comment) (attempted in standing but fatigued; bed level +2 total assist) Where Assessed-Lower Body Dressing: Edge of bed, Bed level Toileting: Dependent Where Assessed-Toileting: Bed level (urinal and incontinent of bowel) Vision   Perception    Praxis   Exercises:   Other Treatments:     Therapy/Group: Individual Therapy   M  07/11/2020, 6:54 AM 

## 2020-07-11 NOTE — Discharge Summary (Signed)
Physician Discharge Summary  Patient ID: BACH ROCCHI MRN: [id number] DOB/AGE: Feb 19, 1986 34 y.o.  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 Cardiac silhouette is similar to prior accounting for slight patient rotation. No pneumothorax or visible effusion. No acute osseous or soft tissue abnormality. IMPRESSION: Persistent albeit slightly improved heterogeneous airspace and interstitial opacities throughout both lungs. Electronically Signed   By: Kreg Shropshire M.D.   On: 06/17/2020 03:44    Labs:  Basic Metabolic Panel: Recent Labs  Lab 07/06/20 0442  NA 137  K 4.0  CL 99  CO2 25  GLUCOSE 104*  BUN 9  CREATININE 0.56*  CALCIUM 9.6    CBC: Recent Labs  Lab 07/09/20 0758  WBC 7.5  NEUTROABS 5.4  HGB 11.5*  HCT 37.0*  MCV 88.5  PLT 339    CBG: Recent Labs  Lab 07/11/20 0645 07/11/20 1133 07/11/20 1623 07/11/20 2131 07/12/20 0616  GLUCAP 102* 131* 128* 109* 103*   Family history.  Mother with diabetes mellitus and hypertension.  Father with diabetes mellitus and hypertension.  Denies any colon cancer esophageal cancer or  rectal cancer  Brief HPI:   Collin Brown is a 34 y.o. right-handed male with obstructive sleep apnea morbid obesity BMI 51.88.  Patient lives with mother and 18 year old nephew.  Reportedly independent prior to admission working as a Production designer, theatre/television/film at [employer].  Presented 05/03/2020 with increasing shortness of breath cough and low-grade fever.  Reportedly patient recently traveled to [place] to visit his brother over the summer.  Patient found to be COVID-19 positive.  He was treated with IV remdesivir and IV steroids.  His condition did initially deteriorate requiring intubation and ultimately self extubated on 05/20/2020 and transitioned to BiPAP.  Hospital course further complicated by acute metabolic encephalopathy CT with MRI unremarkable for acute process.  Patient did have some complaints of left shoulder pain weakness and evaluate for possible brachial plexus mass.  CT of the chest completed showing no abnormality at the level of the left brachial plexus.  Neurology consulted for left upper extremity weakness started on 5-day course of IV steroids.  He was placed on Lovenox for DVT prophylaxis.  Findings of elevated hemoglobin A1c 7.2 placed on insulin therapy.  Wound care nurse follow-up for pressure injury gluteal cleft with wound care as directed.  Tolerating a mechanical soft diet.  Therapy evaluations completed and patient was admitted for a comprehensive rehab program   Hospital Course: [name] was admitted to rehab 06/08/2020 for inpatient therapies to consist of PT, ST and OT at least three hours five days a week. Past admission physiatrist, therapy team and rehab RN have worked together to provide customized collaborative inpatient rehab.  Pertaining to patient's critical illness myopathy complicated by  acute respiratory failure secondary to COVID-19.  Patient continued to show progressive gains while attending full inpatient rehab services.  Maintained on Lovenox for DVT prophylaxis as  well as low-dose aspirin.  Pain management with the use of Neurontin 400 mg 3 times daily as well as scheduled OxyContin.  He was using oxycodone for breakthrough pain.  Patient with noted sacral wound stage IV dressing care as advised wound care nurse follow-up hydrotherapy as directed surgical consulted debridement 06/29/2020 dressings packed gauze moistened ongoing teaching with family for wound care.  In regards to patient suspect brachial plexus likely upper middle trunk he did complete a 5-day course Solu-Medrol follow-up per neurology services recommend EMG as outpatient he continued on gabapentin.  New onset diabetes mellitus hemoglobin A1c 7.2 insulin therapy as directed with full diabetic teaching and patient would need follow-up outpatient monitoring with MD.  Blood pressure monitored he did have some tachycardia related to overall deconditioning beta-blocker as directed no chest pain or shortness of breath.  Super morbid obesity BMI 51.88 dietary follow-up.  He did complete a course of Augmentin Klebsiella UTI as well as stool sample positive for Salmonella.  Blood cultures remain negative patient afebrile.   Blood pressures were monitored on TID basis and controlled  Diabetes has been monitored with ac/hs CBG checks and SSI was use prn for tighter BS control.    Rehab course: During patient's stay in rehab weekly team conferences were held to monitor patient's progress, set goals and discuss barriers to discharge. At admission, patient required moderate assist supine to sit moderate assist for rolling.  Max assist grooming  Physical exam.  Blood pressure 162/100 pulse 102 temperature 97.8 respirations 17 oxygen saturation 96% room air Constitutional.  No acute distress HEENT Head.  Normocephalic and atraumatic Eyes.  Pupils round and reactive to light no discharge.nystagmus Neck.  Supple nontender no JVD without thyromegaly Cardiac regular rate rhythm without any extra sounds or murmur  heard Abdomen.  Soft nontender positive bowel sounds without rebound Respiratory effort normal no respiratory distress without wheeze Neurologic alert oriented hyperalgesia left upper extremity Motor.  Right upper extremity 4/5 Bilateral lower extremities hip flexion knee extension 4 -/5 ankle dorsiflexion 4/5 Right upper extremity shoulder abduction 2 -/5 elbow flexion extension 1+/5 wrist extension 2+/5 handgrip 3 -/5 Skin.  Sacral wound deep and gluteal cleft 2.2 x 1.7 cm  He/  has had improvement in activity tolerance, balance, postural control as well as ability to compensate for deficits. He/ has had improvement in functional use RUE/LUE  and RLE/LLE as well as improvement in awareness.  Working with energy conservation techniques.  Supine to sitting edge of bed head of bed flat and air mattress set to static full-thickness with supervision.  Sit to stand edge of bed rolling walker contact-guard assist.  Ambulates 435 feet rolling walker contact-guard assist.  Focus on ADLs family education completing bed mobility with supervision.  Patient sat edge of bed for ADLs demonstrate upper body bathing and dressing minimal assist for donning shirt and supervision for bathing good carryover with hemitechnique.  Used a Sports administrator for lower body dressing with education with his mother.  Donned his pants with moderate assist.  Full family teaching completed plan discharge to home       Disposition: Discharged to home    Diet: Carb modified  Special Instructions: No driving smoking or alcohol  Wound care.  Apply Santyl to sacral pressure injury in a nickel thick layer.  Cover with a saline moistened  gauze then dry gauze or ABD pad change daily.  Medications at discharge 1.  Tylenol as needed 2.  Vitamin C 500 mg daily 3.  Aspirin 81 mg daily 4.  Neurontin 400 mg 3 times daily 5.  NovoLog 4 units 3 times daily with meals 6.  Levemir 15 units twice daily 7.  Lopressor 75 mg twice daily 8.   Multivitamin daily 9.  Movantik 25 mg daily 10.  Oxycodone 5 to 10 mg every 6 hours as needed pain 11.  OxyContin sustained-release 20 mg every 12 hours x2 weeks and stop 12.  MiraLAX twice daily hold for loose stools 13.  Senokot S2 tablets twice daily.  Hold for loose stools  30-35 minutes were spent completing discharge summary and discharge planning   Discharge Instructions    Ambulatory referral to Physical Medicine Rehab   Complete by: As directed    Moderate complexity follow-up 1 month critical illness myopathy       Follow-up Information    Raulkar, Drema Pry, MD Follow up.   Specialty: Physical Medicine and Rehabilitation Why: Office to call for appointment Contact information: [address] [place] [address] [phone]               Signed: Charlton Amor 07/12/2020, 6:44 AM

## 2020-07-12 ENCOUNTER — Inpatient Hospital Stay (HOSPITAL_COMMUNITY): Payer: BC Managed Care – PPO

## 2020-07-12 LAB — GLUCOSE, CAPILLARY
Glucose-Capillary: 103 mg/dL — ABNORMAL HIGH (ref 70–99)
Glucose-Capillary: 119 mg/dL — ABNORMAL HIGH (ref 70–99)
Glucose-Capillary: 106 mg/dL — ABNORMAL HIGH (ref 70–99)
Glucose-Capillary: 118 mg/dL — ABNORMAL HIGH (ref 70–99)

## 2020-07-12 NOTE — Progress Notes (Signed)
Occupational Therapy Discharge Summary  Patient Details  Name: Collin Brown MRN: [id number] Date of Birth: [birth year]  Today's Date: 07/12/2020 OT Individual Time: 1100-1200 OT Individual Time Calculation (min): 60 min    Pt received in bed agreeable to BADl at EOB. Pt completes supine>sitting 2x throughout session with S. Pt completes seated bathing and dressing as stated below in ADL section with CGA for LB and UB dressing. A only to advance pants past hips on L only las 10%. Pt uses LHSS to wash back and B feet and dons/doffs footwear with AE. K tape reapplied to L shoulder in sidelying rest break during ADL. Pt completes simulated toileting with CGA and RW for transfer. Exited session with pt seated in bed, exit alarm on and call light in reach   Patient has met 7 of 7 long term goals due to improved activity tolerance, improved balance, postural control, ability to compensate for deficits and functional use of  LEFT upper extremity.  Patient to discharge at overall Supervision-CGA level.  Patient's care partner was present for family education with mother being overwhelmed and leaving room for part of the hands on day. Pt is able to direct care and mother can  to provide the necessary physical assistance at discharge.    Reasons goals not met: n/a  Recommendation:  Patient will benefit from ongoing skilled OT services in home health setting to continue to advance functional skills in the area of BADL and Reduce care partner burden.  Equipment: No equipment provided  Reasons for discharge: treatment goals met and discharge from hospital  Patient/family agrees with progress made and goals achieved: Yes  OT Discharge Precautions/Restrictions    General   Vital Signs   Pain   ADL ADL Eating: (P) Modified independent Where Assessed-Eating: (P) Edge of bed Grooming: (P) Modified independent Where Assessed-Grooming: (P) Edge of bed Upper Body Bathing: (P)  Supervision/safety Where Assessed-Upper Body Bathing: (P) Edge of bed Lower Body Bathing: (P) Contact guard Where Assessed-Lower Body Bathing: (P) Edge of bed Upper Body Dressing: (P) Supervision/safety Where Assessed-Upper Body Dressing: (P) Edge of bed Lower Body Dressing: Other (Comment) (attempted in standing but fatigued; bed level +2 total assist) Where Assessed-Lower Body Dressing: Edge of bed, Bed level Toileting: Dependent Where Assessed-Toileting: Bed level (urinal and incontinent of bowel) Vision Baseline Vision/History: No visual deficits Patient Visual Report: No change from baseline Vision Assessment?: Vision impaired- to be further tested in functional context Perception  Perception: Within Functional Limits Praxis Praxis: Intact Cognition Overall Cognitive Status: Within Functional Limits for tasks assessed Arousal/Alertness: Awake/alert Focused Attention: Appears intact Sustained Attention: Appears intact Memory: Appears intact Safety/Judgment: Appears intact Comments: Has increased anxiety with mobility, especially novel tasks Sensation Sensation Light Touch: Impaired by gross assessment Hot/Cold: Not tested Proprioception: Appears Intact Stereognosis: Not tested Motor  Motor Motor: Other (comment) Motor - Skilled Clinical Observations: General deconditioning s/p COVID Motor - Discharge Observations: Continued general deconditioning s/p COVID with morbid obesity, improved since admission Mobility  Bed Mobility Rolling Right: Supervision/verbal cueing Right Sidelying to Sit: Supervision/Verbal cueing Sit to Sidelying Right: Supervision/Verbal cueing Transfers Sit to Stand: Supervision/Verbal cueing Stand to Sit: Supervision/Verbal cueing  Trunk/Postural Assessment  Cervical Assessment Cervical Assessment:  (forward head) Thoracic Assessment Thoracic Assessment:  (rounded shoudlers) Lumbar Assessment Lumbar Assessment:  (post pelvic tilt) Postural  Control Postural Control: Within Functional Limits  Balance Static Sitting Balance Static Sitting - Balance Support: No upper extremity supported Static Sitting - Level of Assistance: 7: Independent Dynamic  Sitting Balance Dynamic Sitting - Balance Support: Feet supported;During functional activity Dynamic Sitting - Level of Assistance: 5: Stand by assistance Static Standing Balance Static Standing - Level of Assistance: 5: Stand by assistance Dynamic Standing Balance Dynamic Standing - Balance Support: During functional activity Dynamic Standing - Level of Assistance: 5: Stand by assistance Extremity/Trunk Assessment RUE Assessment RUE Assessment: Within Functional Limits Passive Range of Motion (PROM) Comments: WNL LUE Assessment LUE Assessment: Exceptions to WFL General Strength Comments: Pt with improved shoudler activaiton 0-90 in gravity eliminated positions, grip and wrist are 3+/5    M  07/12/2020, 12:16 PM  

## 2020-07-12 NOTE — Progress Notes (Signed)
[facility] PHYSICAL MEDICINE & REHABILITATION PROGRESS NOTE   Subjective/Complaints: Sacrum hurts but it feels better. Talked to him about stopping dilaudid, and he felt that he could do that. Hydrotherapy signed off yesterday. Pt feels prepared to go home  ROS: Patient denies fever, rash, sore throat, blurred vision, nausea, vomiting, diarrhea, cough, shortness of breath or chest pain,  headache, or mood change.     No results found. No results for input(s): WBC, HGB, HCT, PLT in the last 72 hours. No results for input(s): NA, K, CL, CO2, GLUCOSE, BUN, CREATININE, CALCIUM in the last 72 hours.  Intake/Output Summary (Last 24 hours) at 07/12/2020 1048 Last data filed at 07/12/2020 0700 Gross per 24 hour  Intake 800 ml  Output 1650 ml  Net -850 ml     Pressure Injury 06/08/20 Coccyx Unstageable - Full thickness tissue loss in which the base of the injury is covered by slough (yellow, tan, gray, green or brown) and/or eschar (tan, brown or black) in the wound bed. yellow necrotic tissue in the crease o (Active)  06/08/20 1830 (with Lizabeth Leyden RN)  Location: Coccyx  Location Orientation:   Staging: Unstageable - Full thickness tissue loss in which the base of the injury is covered by slough (yellow, tan, gray, green or brown) and/or eschar (tan, brown or black) in the wound bed.  Wound Description (Comments): yellow necrotic tissue in the crease originally put in as a nonpressure wound  Present on Admission: Yes    Physical Exam: Vital Signs Blood pressure 133/83, pulse 93, temperature 98.2 F (36.8 C), resp. rate 20, height 5\' 10"  (1.778 m), weight (!) 194.1 kg, SpO2 95 %. Constitutional: No distress . Vital signs reviewed. HEENT: EOMI, oral membranes moist Neck: supple Cardiovascular: RRR without murmur. No JVD    Respiratory/Chest: CTA Bilaterally without wheezes or rales. 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 No odor.. Musc: No edema in extremities.  No tenderness in extremities. Neuro: Alert Motor: LUE: 1+ to 2/5 proximal distal--making improvements. Weakest in triceps RUE: 4/5 proximal distal Bilateral lower extremities: 4-4+/5 proximal to distal   Assessment/Plan: 1. 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  Physiatrist is providing close team supervision and 24 hour management of active medical problems listed below.  Physiatrist and rehab team continue to assess barriers to discharge/monitor patient progress toward functional and medical goals  Care Tool:  Bathing    Body parts bathed by patient: Left arm, Chest, Abdomen, Face   Body parts bathed by helper: Right arm, Right lower leg, Left lower leg     Bathing assist Assist Level: Moderate Assistance - Patient 50 - 74%     Upper Body Dressing/Undressing Upper body dressing   What is the patient wearing?: Hospital gown only    Upper body assist Assist Level: Minimal Assistance - Patient > 75%    Lower Body Dressing/Undressing Lower body dressing      What is the patient wearing?: Pants     Lower body assist Assist for lower body dressing: Maximal Assistance - Patient 25 - 49%     Toileting Toileting    Toileting assist Assist for toileting: Moderate Assistance - Patient 50 - 74%     Transfers Chair/bed transfer  Transfers assist  Chair/bed transfer activity  did not occur: Safety/medical concerns  Chair/bed transfer assist level: Contact Guard/Touching assist Chair/bed transfer assistive device: Geologist, engineering   Ambulation assist   Ambulation activity did not occur: Safety/medical concerns  Assist level: Contact Guard/Touching assist Assistive device:  Walker-rolling Max distance: >250 ft   Walk 10 feet activity   Assist  Walk 10 feet activity did not occur: Safety/medical concerns  Assist level: Contact Guard/Touching assist Assistive device: Walker-rolling   Walk 50 feet activity   Assist Walk 50 feet with 2 turns activity did not occur: Safety/medical concerns (did not perform turns)  Assist level: Contact Guard/Touching assist Assistive device: Walker-rolling    Walk 150 feet activity   Assist Walk 150 feet activity did not occur: Safety/medical concerns  Assist level: Contact Guard/Touching assist Assistive device: Walker-rolling    Walk 10 feet on uneven surface  activity   Assist Walk 10 feet on uneven surfaces activity did not occur: Safety/medical concerns   Assist level: Contact Guard/Touching assist Assistive device: Photographer Will patient use wheelchair at discharge?: No   Wheelchair activity did not occur: Safety/medical concerns         Wheelchair 50 feet with 2 turns activity    Assist    Wheelchair 50 feet with 2 turns activity did not occur: Safety/medical concerns       Wheelchair 150 feet activity     Assist  Wheelchair 150 feet activity did not occur: Safety/medical concerns       Blood pressure 133/83, pulse 93, temperature 98.2 F (36.8 C), resp. rate 20, height 5\' 10"  (1.778 m), weight (!) 194.1 kg, SpO2 95 %.  Medical Problem List and Plan: 1.  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     2.  Antithrombotics: -DVT/anticoagulation: Lovenox 80 mg daily per surgery             -antiplatelet therapy: Aspirin 81 mg daily 3. Pain Management: sacrum/LUE  11/12: continue gabapentin, kpad   -dilaudid for more severe  pain  Oxycontin to 20mg  q12 to decrease dependence on PRN's    -reduced prn dilaudid to 2mg  q12 prn  12/2 dc prn dilaudid and scheduled dilaudid   -he feels he can make do on oxycodone with oxy cr  4. Mood: pt needs regular ego support, falling into some dependent personality patterns             -antipsychotic agents: N/A 5. Neuropsych: This patient is capable of making decisions on his own behalf. 6. 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 Fluids/Electrolytes/Nutrition: encourage po 8.  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  Patient completed 5-day course Solu-Medrol             Supportive care with range of motion             Recommend NCV/EMG as outpatient.  -gabapentin as above  WHO   9.  New onset diabetes mellitus, hemoglobin A1c 7.2.  NovoLog 4 units 3 times daily, Levemir 15 units twice daily.  CBG (last 3)  Recent Labs    07/11/20 1623 07/11/20 2131 07/12/20 0616  GLUCAP 128* 109* 103*   12/2 reasonale control.  10.  Hypertension with tachycardia.  Lopressor 50 mg twice daily.    BP/HR controlled 11.  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  Dietary follow-up  11/14- BMI down to 49.63 12. Fever, UCX with 100 Klebsiella: Resolved  -stool sample also positive for salmonella  -chest xray and exam unremarkable  -blood cultures negative  -abx narrowed to augmentin which completed 11/11 13. Hypokalemia:   Potassium 4.0 on 11/26  Continue supplementation  14.  Drug-induced constipation  On miralax and senna-s bid    Movantik started on 11/28 to replace linzess  -stools generally mushy. Will hold miralax 12/2 15.  Acute blood loss anemia  Hemoglobin 11.5 on 11/21 and 11/29  Continue to monitor    LOS: 34 days A FACE TO  FACE EVALUATION WAS PERFORMED  Ranelle Oyster 07/12/2020, 10:48 AM

## 2020-07-12 NOTE — Progress Notes (Signed)
Daily dressing change done on sacral wound for pt as ordered for daily wound care. Patient tolerated well. Patient complaining of pain close to rectum, no redness or swelling around rectum. Pt had had two large lose stool BM this morning. Applied barrier cream to sore area around rectum. Will continue to monitor area, and continue plan of care.  Rayburn Ma, LPN

## 2020-07-12 NOTE — Progress Notes (Signed)
Physical Therapy Session Note  Patient Details  Name: Collin Brown MRN: [id number] Date of Birth: [birth year]  Today's Date: 07/12/2020 PT Individual Time: 1305-1405 PT Individual Time Calculation (min): 60 min   Short Term Goals: Week 1:  PT Short Term Goal 1 (Week 1): Pt will perform bed mobility with maxA +1 person PT Short Term Goal 1 - Progress (Week 1): Met PT Short Term Goal 2 (Week 1): Pt will perform bed<>chair transfers with maxA and LRAD PT Short Term Goal 2 - Progress (Week 1): Met PT Short Term Goal 3 (Week 1): Pt will tolerate sitting in chair/recliner for >1 hour with stable vital signs PT Short Term Goal 3 - Progress (Week 1): Met PT Short Term Goal 4 (Week 1): Pt will begin initiating pre-gait training with maxA and LRAD PT Short Term Goal 4 - Progress (Week 1): Met Week 2:  PT Short Term Goal 1 (Week 2): Patient will perform bed mobility with CGA without use of hospital bed functions. PT Short Term Goal 1 - Progress (Week 2): Progressing toward goal PT Short Term Goal 2 (Week 2): Patient will perform basic transfers with min A consitently. PT Short Term Goal 2 - Progress (Week 2): Progressing toward goal PT Short Term Goal 3 (Week 2): Patient will ambulate 100 ft using LRAD with mod A. PT Short Term Goal 3 - Progress (Week 2): Progressing toward goal Week 3:  PT Short Term Goal 1 (Week 3): Patient will perform bed mobility with CGA without use of hospital bed functions. PT Short Term Goal 1 - Progress (Week 3): Progressing toward goal PT Short Term Goal 2 (Week 3): Patient will perform basic transfers with min A consistently. PT Short Term Goal 2 - Progress (Week 3): Progressing toward goal PT Short Term Goal 3 (Week 3): Patient will ambulate 100 ft using LRAD with min A. PT Short Term Goal 3 - Progress (Week 3): Progressing toward goal PT Short Term Goal 4 (Week 3): Patient will initiate stair training. PT Short Term Goal 4 - Progress (Week 3): Progressing toward  goal  Skilled Therapeutic Interventions/Progress Updates:    PAIN pt did not rate but sitting time limited due to rectal/sacral pain.  Pt initially sidelying having vitals checked.  Side to sit w/supervision, slowly, uses rail. Pt sat several min to "get it together"  Sit to stand and pt secures L hand in walker orthosis w/supervision.  Gait x 142f w/RW and supervision, therapist follows w/ wc for seated rest + pressure relief between activities. Pt repeats STS mult times throughout session all w/supervision only, performs for repositioning/comfort.  Stairs: approaches stairs w/RW w/supervision, removal of RW requires assist due to limited LUE use/wt of walker. Ascended/descended 3 steps w/single rail/reaching forward to simulate home setup., cga of 1  Functional gait -626fweaving thru cones and around wc w/RW and supervision/performed to  to simulate home navigation w/RW  Gait 15055f/RW and  Supervision, turn sit to bed w/supervision, sit to side w/supervision and use of rails. Pt positioned in sidelying w/pillows for support, rails up x 4, therapist provided cold water per pt request.  Therapy Documentation Precautions:  Precautions Precautions: Fall Precaution Comments: flaccid LUE (protect from subluxation); sling when OOB for comfort; skin integrity (sacral wound); monitor vitals Required Braces or Orthoses: Sling Restrictions Weight Bearing Restrictions: No   Therapy/Group: Individual Therapy  BarCallie FieldingT   BarJerrilyn [place]/09/2019, 3:12 PM

## 2020-07-12 NOTE — Progress Notes (Signed)
Patient ID: Collin Brown, male   DOB: 12/09/1985, 34 y.o.   MRN: [id number]  SW received updates from Assistance DON that pt mother called and stated pt was in need of a hospital bed. SW spoke with PT and reports a hospital bed is not needed for pt, and was aware there was not enough space in the home and item would not be appropriate.  SW called pt mother [name] [phone]) to provide above updates.Through conversation, and discussing that the standard hospital bed will still have a monthly rental rate even if medically appropriate, she opted to not get the hospital bed. SW reiterated there will be support from [facility] for wound care needs, and informed on progression patient has made in rehab. No further questions/concerns. SW met with pt in room to inform on above. Pt was not aware of hospital bed conversation. SW reviewed the process with [facility] calling to schedule home visit. SW spoke with [facility] to inform on new wound care orders in system per [facility]. Reports will pull and send to branch. SW reiterated to contact the patient the schedule the home visit.   Loralee Pacas, MSW, [place] Office: [phone] Cell: [phone] Fax: [phone]

## 2020-07-12 NOTE — Progress Notes (Signed)
Physical Therapy Discharge Summary  Patient Details  Name: Collin Brown MRN: [id number] Date of Birth: [birth year]  Today's Date: 07/12/2020 PT Individual Time: 0900-1009 PT Individual Time Calculation (min): 69 min    Patient has met 7 of 8 long term goals due to improved activity tolerance, improved balance, improved postural control, increased strength, decreased pain and ability to compensate for deficits.  Patient to discharge at an ambulatory level [place].   Patient's care partner is independent to provide the necessary physical assistance at discharge.  Reasons goals not met: Patient only able to perform 3 steps with 1 rail rather than 4, as goal states. Patient only has 3 steps to enter his home and can achieve this for safe home entry with CGA.   Recommendation:  Patient will benefit from ongoing skilled PT services in home health setting to continue to advance safe functional mobility, address ongoing impairments in activity tolerance, balance, strength, functional mobility, gait and stair training, pressure relief/positioning, patient/caregiver education, and minimize fall risk.  Equipment: bariatric RW  Reasons for discharge: treatment goals met  Patient/family agrees with progress made and goals achieved: Yes  Skilled therapeutic Intervention: Patient in R side lying in the bed upon PT arrival. Patient alert and agreeable to PT session.Patient reported 8-9/10 sacral pain during session, RN made aware. PT provided repositioning, rest breaks, and distraction as pain interventions throughout session.   Patient required significant time and rest breaks due to mobility during session. Educated on pain management and scheduling for improved mobility at d/c. Also, encouraged patient to review pain medication schedule thoroughly with medical team prior to d/c. Patient in agreement.   Therapeutic Activity: Bed Mobility: Patient performed supine to/from sit with  supervision x2 with bed on max inflate to simulate firm mattress or couch. Supervision provided only for safety due to increased effort with mobility. After initially sitting, patient required increased time due to pain anxiety. He appropriately returned to lying when he was unable to overcome pain. Discussed morning routine and strategies for independent access to medications, ready to eat breakfast foods, and reinforced recommendation for CGA with all mobility and need of someone with him for toileting and ambulating to the kitchen. Patient stated understanding. Patient then able to tolerate coming to sitting EOB and continuing with mobility.  Transfers: Patient performed sit to/from stand x3 and a toilet transfer x1 with supervision for safety demonstrating safe technique using RW with L hand splint. Patient was continent of bowl on BSC, required total A for peri-care and lower body clothing management due to sacral pain.   Gait Training:  Patient ambulated 180 feet using RW with L hand splint with CGA for safety, w/c follow for roho cushion for pressure relief during rest breaks due to sacral pain. Ambulated as described below.  Patient ascended/descended 3-6" steps using high part of R rial to simulate pulling on a sturdy pole by his steps at home with CGA. Performed step-to gait pattern leading with R while ascending and L while dscending. Provided cues for technique and sequencing. Took video of transfer from the car to home entry, demonstrating assist for patient for patient's friend who will assist him into the house at d/c.   Wheelchair Mobility:  Patient was transported in the w/c with total A back to his room due to uncontrolled sacral pain and bowl urgency. RN made aware. Educated patient on management of pain and bowl incontinence at home during session.   Educated patient on fall risk/prevention, home modifications to  prevent falls, and activation of emergency services in the event of a fall  during session.   Patient in R side-lying in the bed handed off to RN for dressing change at end of session with breaks locked and all needs within reach.    PT Discharge Precautions/Restrictions Precautions Precautions: Fall Restrictions Weight Bearing Restrictions: No Vision/Perception  Perception Perception: Within Functional Limits Praxis Praxis: Intact  Cognition Overall Cognitive Status: Within Functional Limits for tasks assessed Arousal/Alertness: Awake/alert Focused Attention: Appears intact Sustained Attention: Appears intact Memory: Appears intact Problem Solving: Appears intact Safety/Judgment: Appears intact Comments: Has increased anxiety with mobility, especially novel tasks Sensation Sensation Light Touch: Impaired by gross assessment (Decreased light touch throughout, improved sensitivity since admission) Proprioception: Appears Intact Coordination Gross Motor Movements are Fluid and Coordinated: No Fine Motor Movements are Fluid and Coordinated: No Coordination and Movement Description: Extremely effortful 2/2 body mass and significant deconditioning s/p COVID Heel Shin Test: unable due to increased sacral pain at wound site Motor  Motor Motor: Other (comment) (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) Motor - Discharge Observations: Continued general deconditioning s/p COVID with morbid obesity, improved since admission  Mobility Bed Mobility Bed Mobility: Rolling Right;Right Sidelying to Sit;Sit to Sidelying Right Rolling Right: Supervision/verbal cueing Right Sidelying to Sit: Supervision/Verbal cueing Sit to Sidelying Right: Supervision/Verbal cueing Transfers Transfers: Sit to Stand;Stand to Sit;Stand Pivot Transfers Sit to Stand: Supervision/Verbal cueing Stand to Sit: Supervision/Verbal cueing Stand Pivot Transfers: Supervision/Verbal cueing Transfer (Assistive device): Rolling walker (with L hand splint or no  AD) Locomotion  Gait Ambulation: Yes Gait Assistance: Contact Guard/Touching assist Assistive device: Rolling walker (with L hand splint) Gait Gait: Yes Gait Pattern: Step-through pattern;Decreased hip/knee flexion - right;Decreased hip/knee flexion - left;Lateral hip instability;Decreased trunk rotation;Trunk flexed;Narrow base of support Gait velocity: decreased Stairs / Additional Locomotion Stairs: Yes Stairs Assistance: Minimal Assistance - Patient > 75% Stair Management Technique: One rail Right Number of Stairs: 4 Height of Stairs: 6 Wheelchair Mobility Wheelchair Mobility: No  Trunk/Postural Assessment  Cervical Assessment Cervical Assessment: Exceptions to [facility] (forward head) Thoracic Assessment Thoracic Assessment: Exceptions to [facility] (mild kyphosis; flexible) Lumbar Assessment Lumbar Assessment: Exceptions to [facility] (sacral sitting) Postural Control Postural Control: Within Functional Limits  Balance Balance Balance Assessed: Yes Static Sitting Balance Static Sitting - Balance Support: No upper extremity supported Static Sitting - Level of Assistance: 7: Independent Dynamic Sitting Balance Dynamic Sitting - Balance Support: Feet supported;During functional activity Dynamic Sitting - Level of Assistance: 5: Stand by assistance Static Standing Balance Static Standing - Balance Support: No upper extremity supported;During functional activity Static Standing - Level of Assistance: 5: Stand by assistance Dynamic Standing Balance Dynamic Standing - Balance Support: During functional activity Dynamic Standing - Level of Assistance: 5: Stand by assistance (supervision-CGA) Extremity Assessment  RLE Assessment RLE Assessment: Within Functional Limits Active Range of Motion (AROM) Comments: hip flexion limited due to body habitus and sacral wound pain General Strength Comments: Grossly 4+/5 with functional mobility, unable to tolerate formal strength testing due to sacral  pain LLE Assessment LLE Assessment: Within Functional Limits Active Range of Motion (AROM) Comments: hip flexion limited due to body habitus and sacral wound pain General Strength Comments: Grossly 4+/5 with functional mobility, unable to tolerate formal strength testing due to sacral pain    Cleatus Goodin L Daymen Hassebrock PT, DPT  07/12/2020, 5:07 PM

## 2020-07-12 NOTE — Consult Note (Signed)
WOC Nurse wound follow up Patient receiving care in [facility] 4W16 Patient is scheduled for discharge on Friday PT signing off on Hydrotherapy. Orders changed as follows:  Apply Santyl to sacral pressure injury in a nickel thick layer. Cover with a saline moistened gauze, then dry gauze or ABD pad.  Change daily.  Bedside nurse to change dressing daily.  Renaldo Reel Katrinka Blazing, MSN, RN, CMSRN, Angus Seller, [facility] Wound Treatment Associate Pager [phone]

## 2020-07-13 ENCOUNTER — Telehealth: Payer: Self-pay | Admitting: *Deleted

## 2020-07-13 DIAGNOSIS — L89154 Pressure ulcer of sacral region, stage 4: Secondary | ICD-10-CM

## 2020-07-13 LAB — GLUCOSE, CAPILLARY
Glucose-Capillary: 99 mg/dL (ref 70–99)
Glucose-Capillary: 99 mg/dL (ref 70–99)

## 2020-07-13 MED ORDER — INSULIN DETEMIR 100 UNIT/ML FLEXPEN: 15.0000 [IU] | PEN_INJECTOR | Freq: Two times a day (BID) | SUBCUTANEOUS | 0 refills | Status: DC

## 2020-07-13 MED ORDER — ASCORBIC ACID 500 MG PO TABS: 500.0000 mg | ORAL_TABLET | Freq: Every day | ORAL | 0 refills | Status: AC

## 2020-07-13 MED ORDER — ACETAMINOPHEN 325 MG PO TABS
500.0000 mg | ORAL_TABLET | Freq: Four times a day (QID) | ORAL | Status: DC | PRN
Start: 2020-07-13 — End: 2020-09-07

## 2020-07-13 MED ORDER — OXYCODONE HCL ER 20 MG PO T12A: 20.0000 mg | EXTENDED_RELEASE_TABLET | Freq: Two times a day (BID) | ORAL | 0 refills | Status: DC

## 2020-07-13 MED ORDER — SENNOSIDES-DOCUSATE SODIUM 8.6-50 MG PO TABS: 2.0000 | ORAL_TABLET | Freq: Two times a day (BID) | ORAL | 0 refills | Status: DC

## 2020-07-13 MED ORDER — ADULT MULTIVITAMIN W/MINERALS CH: 1.0000 | ORAL_TABLET | Freq: Every day | ORAL | Status: DC

## 2020-07-13 MED ORDER — JUVEN PO PACK: 1.0000 | PACK | Freq: Two times a day (BID) | ORAL | 0 refills | Status: DC

## 2020-07-13 MED ORDER — ASPIRIN 81 MG PO CHEW: 81.0000 mg | CHEWABLE_TABLET | Freq: Every day | ORAL | Status: DC

## 2020-07-13 MED ORDER — INSULIN GLARGINE 100 UNIT/ML ~~LOC~~ SOLN: 15.0000 [IU] | Freq: Two times a day (BID) | SUBCUTANEOUS | 0 refills | Status: DC

## 2020-07-13 MED ORDER — ONDANSETRON HCL 4 MG PO TABS: 4.0000 mg | ORAL_TABLET | Freq: Three times a day (TID) | ORAL | 0 refills | Status: DC | PRN

## 2020-07-13 MED ORDER — INSULIN LISPRO (1 UNIT DIAL) 100 UNIT/ML (KWIKPEN): 4.0000 [IU] | PEN_INJECTOR | Freq: Three times a day (TID) | SUBCUTANEOUS | 11 refills | Status: DC

## 2020-07-13 MED ORDER — CAREFINE PEN NEEDLES 32G X 6 MM MISC: 1.0000 | Freq: Three times a day (TID) | 1 refills | Status: DC

## 2020-07-13 MED ORDER — POTASSIUM CHLORIDE CRYS ER 20 MEQ PO TBCR: 40.0000 meq | EXTENDED_RELEASE_TABLET | Freq: Two times a day (BID) | ORAL | 0 refills | Status: DC

## 2020-07-13 MED ORDER — METOPROLOL TARTRATE 75 MG PO TABS: 75.0000 mg | ORAL_TABLET | Freq: Two times a day (BID) | ORAL | 0 refills | Status: DC

## 2020-07-13 MED ORDER — NALOXEGOL OXALATE 25 MG PO TABS: 25.0000 mg | ORAL_TABLET | Freq: Every day | ORAL | 0 refills | Status: DC

## 2020-07-13 MED ORDER — OXYCODONE HCL 5 MG PO TABS
5.0000 mg | ORAL_TABLET | Freq: Four times a day (QID) | ORAL | 0 refills | Status: DC | PRN
Start: 2020-07-13 — End: 2020-07-19

## 2020-07-13 MED ORDER — COLLAGENASE 250 UNIT/GM EX OINT: TOPICAL_OINTMENT | Freq: Every day | CUTANEOUS | 4 refills | Status: DC

## 2020-07-13 MED ORDER — GABAPENTIN 400 MG PO CAPS: 400.0000 mg | ORAL_CAPSULE | Freq: Three times a day (TID) | ORAL | 0 refills | Status: DC

## 2020-07-13 NOTE — Progress Notes (Signed)
Recreational Therapy Discharge Summary Patient Details  Name: Collin Brown MRN: [id number] Date of Birth: 09-14-1985 Today's Date: 07/13/2020  Long term goals set: 1  Long term goals met: 1  Comments on progress toward goals: Pt discharged home today with family at supervision/contact guard assist level ambulating with RW.  TR sessions focused activity analysis/modifications and activity tolerance.  Pt requires set up assistance for tasks seated or bedlevel but is then able to complete them with Mod I.    Reasons goals not met: n/a  Equipment acquired: n/a  Reasons for discharge: discharge from hospital  Follow-up: [place] agrees with progress made and goals achieved: Yes  Sofiya Ezelle 07/13/2020, 1:19 PM

## 2020-07-13 NOTE — Discharge Instructions (Signed)
Inpatient Rehab Discharge Instructions  Collin Brown Discharge date and time:  07/13/20  Activities/Precautions/ Functional Status: Activity: activity as tolerated Diet: regular diet Wound Care: Remove old dressing and  cleanse wound base with normal saline. Apply Santyl to sacral ulcer in a nickel thick layer with a saline moistened gauze then cover with gauze or ABD pad daily    Functional status:  ___ No restrictions     ___ Walk up steps independently _X__ 24/7 supervision/assistance   ___ Walk up steps with assistance ___ Intermittent supervision/assistance  ___ Bathe/dress independently ___ Walk with walker     _X__ Bathe/dress with assistance ___ Walk Independently    ___ Shower independently ___ Walk with assistance    ___ Shower with assistance _X__ No alcohol     ___ Return to work/school ________   COMMUNITY REFERRALS UPON DISCHARGE:    Home Health:   PT     OT     RN                  Agency: [facility]       Phone:[phone] *Please expect follow-up within 2-3 days to schedule your home visit. If you have not received follow-up, be sure to contact the branch directly.*   Medical Equipment/Items Ordered: bariatric rolling walker, bariatric tub transfer bench, bariatric bedside commmode                                                 Agency/Supplier: [facility] [phone]   GENERAL COMMUNITY RESOURCES FOR PATIENT/FAMILY: New patient appt/hospital follow-up with [name], NP at [facility]- Thursday Jul 26, 2020 10:00 AM [facility] [address] [place],  [place]  [address] Main: [phone] *Please call to reschedule if unable to keep this appointment.    Special Instructions: No Driving smoking or alcohol   My questions have been answered and I understand these instructions. I will adhere to these goals and the provided educational materials after my discharge from the  hospital.  Patient/Caregiver Signature _______________________________ Date __________  Clinician Signature _______________________________________ Date __________  Please bring this form and your medication list with you to all your follow-up doctor's appointments.

## 2020-07-13 NOTE — Plan of Care (Signed)
  Problem: Consults Goal: RH GENERAL PATIENT EDUCATION Description: See Patient Education module for education specifics. Outcome: Completed/Met Goal: Skin Care Protocol Initiated - if Braden Score 18 or less Description: If consults are not indicated, leave blank or document N/A Outcome: Completed/Met Goal: Nutrition Consult-if indicated Outcome: Completed/Met Goal: Diabetes Guidelines if Diabetic/Glucose > 140 Description: If diabetic or lab glucose is > 140 mg/dl - Initiate Diabetes/Hyperglycemia Guidelines & Document Interventions  Outcome: Completed/Met   Problem: RH BOWEL ELIMINATION Goal: RH STG MANAGE BOWEL WITH ASSISTANCE Description: STG Manage Bowel with  mod Assistance. Outcome: Completed/Met Goal: RH STG MANAGE BOWEL W/MEDICATION W/ASSISTANCE Description: STG Manage Bowel with Medication with supervision Assistance. Outcome: Completed/Met   Problem: RH SKIN INTEGRITY Goal: RH STG SKIN FREE OF INFECTION/BREAKDOWN Description: Skin to remain free from additional breakdown while on rehab with supervision assistance. Outcome: Completed/Met Goal: RH STG MAINTAIN SKIN INTEGRITY WITH ASSISTANCE Description: STG Maintain Skin Integrity With  mod I Assistance. Outcome: Completed/Met Goal: RH STG ABLE TO PERFORM INCISION/WOUND CARE W/ASSISTANCE Description: STG Able To Perform Incision/Wound Care With mod I Assistance. Outcome: Completed/Met   Problem: RH SAFETY Goal: RH STG ADHERE TO SAFETY PRECAUTIONS W/ASSISTANCE/DEVICE Description: STG Adhere to Safety Precautions With  mod I Assistance/Device. Outcome: Completed/Met   Problem: RH PAIN MANAGEMENT Goal: RH STG PAIN MANAGED AT OR BELOW PT'S PAIN GOAL Description: Pain free and <3  Outcome: Completed/Met

## 2020-07-13 NOTE — Telephone Encounter (Signed)
Pharmacy contacted [place] to let us know that his insurance does not cover the Movantik and it will be $437, or the levimir- they prefer Lantus, and his oxycodone is cheap enough but the oxycontin is $137 for 14 tablets. Per [name] I have discontinued the Movantik because he does not need it and the Levemir can be changed to Lantus same dose. These changes have been done.  Collin Brown will look at the oxycodone/ Oxycontin issues.

## 2020-07-13 NOTE — Progress Notes (Signed)
Inpatient Rehabilitation Care Coordinator  Discharge Note  The overall goal for the admission was met for:   Discharge location: Yes. Pt to d/c to home with 24/7 care from his mother.   Length of Stay: Yes. 34 days.   Discharge activity level: Yes. Sup to CGA.  Home/community participation: Yes. Limited.   Services provided included: MD, RD, PT, OT, RN, CM, TR, Pharmacy, Neuropsych and SW  Financial Services: Private Insurance: [facility]  Follow-up services arranged: Home Health: [place] for HHPT/OT/SN (wound care) and DME: [facility] for bariatric RW, bariatric 3in1BSC, and bariatric TTB (family to purchase this item)  New patient appt/hospital follow-up with Laverna Peace, NP at [facility]- Thursday Jul 26, 2020 10:00 [place] [address] [place],  [place]  [address] Main: [phone]  Comments (or additional information): Contact pt [phone]  Patient/Family verbalized understanding of follow-up arrangements: Yes  Individual responsible for coordination of the follow-up plan: Pt to have assistance with coordinating care needs.   Confirmed correct DME delivered: Rana Snare 07/13/2020    Loralee Pacas, MSW, [place] Office: [phone] Cell: [phone] Fax: [phone]

## 2020-07-13 NOTE — Progress Notes (Signed)
PHYSICAL MEDICINE & REHABILITATION PROGRESS NOTE   Subjective/Complaints: Stool still a little "sudden" still. A little more formed. Sacral pain still present.   ROS: Patient denies fever, rash, sore throat, blurred vision, nausea, vomiting, diarrhea, cough, shortness of breath or chest pain, joint or back pain, headache, or mood change.    No results found. No results for input(s): WBC, HGB, HCT, PLT in the last 72 hours. No results for input(s): NA, K, CL, CO2, GLUCOSE, BUN, CREATININE, CALCIUM in the last 72 hours.  Intake/Output Summary (Last 24 hours) at 07/13/2020 1038 Last data filed at 07/13/2020 0915 Gross per 24 hour  Intake 840 ml  Output --  Net 840 ml     Pressure Injury 06/08/20 Coccyx Unstageable - Full thickness tissue loss in which the base of the injury is covered by slough (yellow, tan, gray, green or brown) and/or eschar (tan, brown or black) in the wound bed. yellow necrotic tissue in the crease o (Active)  06/08/20 1830 (with Lizabeth Leyden RN)  Location: Coccyx  Location Orientation:   Staging: Unstageable - Full thickness tissue loss in which the base of the injury is covered by slough (yellow, tan, gray, green or brown) and/or eschar (tan, brown or black) in the wound bed.  Wound Description (Comments): yellow necrotic tissue in the crease originally put in as a nonpressure wound  Present on Admission: Yes    Physical Exam: Vital Signs Blood pressure 121/79, pulse (!) 105, temperature 97.8 F (36.6 C), resp. rate 18, height 5\' 10"  (1.778 m), weight (!) 194.1 kg, SpO2 95 %. Constitutional: No distress . Vital signs reviewed. obese HEENT: EOMI, oral membranes moist Neck: supple Cardiovascular: RRR without murmur. No JVD    Respiratory/Chest: CTA Bilaterally without wheezes or rales. 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 Stable, no odor Musc: No edema in extremities.  No tenderness in extremities. Neuro: Alert Motor: LUE: 1+ to 2/5 proximal distal--making improvements. Weakest in triceps--stable LUE neuro exam today RUE: 4/5 proximal distal Bilateral lower extremities: 4-4+/5 proximal to distal   Assessment/Plan: 1. 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  Physiatrist is providing close team supervision and 24 hour management of active medical problems listed below.  Physiatrist and rehab team continue to assess barriers to discharge/monitor patient progress toward functional and medical goals  Care Tool:  Bathing    Body parts bathed by patient: Chest, Right arm, Left arm, Abdomen, Front perineal area, Right upper leg, Left upper leg, Right lower leg, Left lower leg, Face   Body parts bathed by helper: Buttocks     Bathing assist Assist Level: Contact Guard/Touching assist     Upper Body Dressing/Undressing Upper body dressing   What is the patient wearing?: Pull over shirt    Upper body assist Assist Level: Supervision/Verbal cueing    Lower Body Dressing/Undressing Lower body dressing      What is the patient wearing?: Pants     Lower body assist Assist for lower body dressing: Contact Guard/Touching assist     Toileting Toileting    Toileting assist Assist for toileting: Contact Guard/Touching assist     Transfers Chair/bed transfer  Transfers assist  Chair/bed transfer activity did not occur: Safety/medical concerns  Chair/bed transfer assist level: Supervision/Verbal cueing Chair/bed  transfer assistive device: Arboriculturist assist   Ambulation activity did not occur: Safety/medical concerns  Assist level: Contact Guard/Touching assist Assistive device: Walker-rolling Max  distance: >400 ft   Walk 10 feet activity   Assist  Walk 10 feet activity did not occur: Safety/medical concerns  Assist level: Contact Guard/Touching assist Assistive device: Walker-rolling   Walk 50 feet activity   Assist Walk 50 feet with 2 turns activity did not occur: Safety/medical concerns (did not perform turns)  Assist level: Contact Guard/Touching assist Assistive device: Walker-rolling    Walk 150 feet activity   Assist Walk 150 feet activity did not occur: Safety/medical concerns  Assist level: Contact Guard/Touching assist Assistive device: Walker-rolling    Walk 10 feet on uneven surface  activity   Assist Walk 10 feet on uneven surfaces activity did not occur: Safety/medical concerns   Assist level: Contact Guard/Touching assist Assistive device: Photographer Will patient use wheelchair at discharge?: No (patient is a [employer] officer)   Wheelchair activity did not occur: N/A         Wheelchair 50 feet with 2 turns activity    Assist    Wheelchair 50 feet with 2 turns activity did not occur: N/A       Wheelchair 150 feet activity     Assist  Wheelchair 150 feet activity did not occur: N/A       Blood pressure 121/79, pulse (!) 105, temperature 97.8 F (36.6 C), resp. rate 18, height 5\' 10"  (1.778 m), weight (!) 194.1 kg, SpO2 95 %.  Medical Problem List and Plan: 1.  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     2.  Antithrombotics: -DVT/anticoagulation: Lovenox 80 mg daily per surgery             -antiplatelet therapy: Aspirin 81 mg daily 3. Pain Management: sacrum/LUE  Dc home on oxycontin 20mg  q12  -oxycodone 5mg  prn  -tylenol prn  -gabapentin for neuro pain 4. Mood: pt needs regular  ego support, falling into some dependent personality patterns             -antipsychotic agents: N/A 5. Neuropsych: This patient is capable of making decisions on his own behalf. 6. 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 Fluids/Electrolytes/Nutrition: encourage po 8.  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  Patient completed 5-day course Solu-Medrol             Supportive care with range of motion             Recommend NCV/EMG as outpatient.  -gabapentin as above  WHO   9.  New onset diabetes mellitus, hemoglobin A1c 7.2.  NovoLog 4 units 3 times daily, Levemir 15 units twice daily.               CBG (last 3)  Recent Labs    07/12/20 1656 07/12/20 2047 07/13/20 0551  GLUCAP 106* 118* 99   12/3 reasonable control.  10.  Hypertension with tachycardia.  Lopressor 50 mg twice daily.    BP/HR controlled 11.  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  Dietary follow-up  11/14- BMI down to 49.63 12. Fever, UCX with 100 Klebsiella: Resolved  -stool sample also positive for salmonella  -chest xray and exam unremarkable  -blood cultures negative  -abx narrowed to augmentin which completed 11/11 13. Hypokalemia:   Potassium 4.0 on 11/26  Continue supplementation  14.  Drug-induced constipation  On miralax and senna-s bid    Movantik started on 11/28 to replace linzess  -dc movantik  -can go home on senna-s bid and prn miralax 15.  Acute blood loss anemia  Hemoglobin 11.5 on 11/21 and 11/29  Continue to monitor    LOS: 35 days A FACE TO FACE EVALUATION WAS PERFORMED  Ranelle Oyster 07/13/2020, 10:38 AM

## 2020-07-13 NOTE — Progress Notes (Signed)
Patient discharged to home with friend. All belongings pack by staff with patient in involved. Patient discharged via wheelchair. Patient stated that he understood all discharge information and had no further questions.

## 2020-07-18 ENCOUNTER — Telehealth: Payer: Self-pay | Admitting: *Deleted

## 2020-07-18 NOTE — Telephone Encounter (Signed)
Lori from [facility] called for SN POC 1wk1, 2wk8 and 2 prn for wound care. Approval given.

## 2020-07-19 ENCOUNTER — Encounter: Payer: BC Managed Care – PPO | Attending: Physical Medicine and Rehabilitation | Admitting: Registered Nurse

## 2020-07-19 ENCOUNTER — Other Ambulatory Visit: Payer: Self-pay

## 2020-07-19 ENCOUNTER — Telehealth: Payer: Self-pay

## 2020-07-19 VITALS — BP 119/93 | HR 96 | Temp 98.0°F | Ht 70.0 in | Wt 341.0 lb

## 2020-07-19 DIAGNOSIS — E119 Type 2 diabetes mellitus without complications: Secondary | ICD-10-CM | POA: Diagnosis not present

## 2020-07-19 DIAGNOSIS — Z79891 Long term (current) use of opiate analgesic: Secondary | ICD-10-CM | POA: Diagnosis not present

## 2020-07-19 DIAGNOSIS — I10 Essential (primary) hypertension: Secondary | ICD-10-CM

## 2020-07-19 DIAGNOSIS — G7281 Critical illness myopathy: Secondary | ICD-10-CM

## 2020-07-19 DIAGNOSIS — R5381 Other malaise: Secondary | ICD-10-CM | POA: Diagnosis present

## 2020-07-19 DIAGNOSIS — G894 Chronic pain syndrome: Secondary | ICD-10-CM

## 2020-07-19 DIAGNOSIS — Z7409 Other reduced mobility: Secondary | ICD-10-CM | POA: Diagnosis present

## 2020-07-19 DIAGNOSIS — S31000D Unspecified open wound of lower back and pelvis without penetration into retroperitoneum, subsequent encounter: Secondary | ICD-10-CM

## 2020-07-19 DIAGNOSIS — U099 Post covid-19 condition, unspecified: Secondary | ICD-10-CM

## 2020-07-19 DIAGNOSIS — Z5181 Encounter for therapeutic drug level monitoring: Secondary | ICD-10-CM

## 2020-07-19 MED ORDER — OXYCODONE HCL ER 20 MG PO T12A: 20.0000 mg | EXTENDED_RELEASE_TABLET | Freq: Two times a day (BID) | ORAL | 0 refills | Status: DC

## 2020-07-19 MED ORDER — OXYCODONE HCL 5 MG PO TABS: 5.0000 mg | ORAL_TABLET | Freq: Four times a day (QID) | ORAL | 0 refills | Status: DC | PRN

## 2020-07-19 NOTE — Telephone Encounter (Signed)
PA submitted for Oxycodone 5 mg and Oxycontin 20 mg both approved. Patient aware and pharmacy aware.

## 2020-07-19 NOTE — Progress Notes (Signed)
Subjective:    Patient ID: Collin Brown, male    DOB: 12/08/1985, 34 y.o.   MRN: [id number]  HPI: Collin Brown is a 34 y.o. 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 Collin Brown presented to [facility] on 05/03/2020 with SOB and generalized weakness and fever for a few days. Patient presented to ED after being positive for COVID-19, he reported he thinks he was exposed to his Coworker who was COVID positive, however his mother had tested positive as well per ED note from Dr Allena Katz.  He was started on COVID protocol with remdesivir.   Collin Brown had a complicated hospital stay, see discharge summaries for details.   Neurology was consulted for left upper extremity weakness, he received steroids per Discharge summary.   DG Chest:  IMPRESSION: Diffuse bilateral ground-glass airspace opacities consistent with the patient's history of viral pneumonia  Collin Brown was admitted to inpatient rehabilitation on 06/08/2020.   Collin Brown was noted  with sacral wound stage 4, wound care was following. He has a scheduled appointment with wound care.   Collin Brown was discharged on 07/13/2020, he is receiving Home Health therapy from [facility]. He states his pain is located in his  lower back and sacral wound. He rates his pain 5. He reports he is walking with walker in his home to transfer from chair to wheelchair.   Collin Brown Morphine equivalent is 60.00 MME.  Oral Swab was Performed today. Narcotic contract was reviewed today, he verbalizes understanding.   Collin Brown mother in room and all questions answered.   Pain Inventory Average Pain 8 Pain Right Now 5 My pain is sharp, stabbing and tingling  In the last 24 hours, has pain interfered with the following? General activity 8 Relation with others 7 Enjoyment of life  7 What TIME of day is your pain at its worst? morning  and night Sleep (in general) Poor  Pain is worse with: walking, bending, sitting and inactivity Pain improves with: rest, therapy/exercise and medication Relief from Meds: 5  walk with assistance use a walker how many minutes can you walk? 5 ability to climb steps?  yes do you drive?  no use a wheelchair transfers alone  employed # of hrs/week 0 disabled: date disabled 05/03/20 I need assistance with the following:  meal prep, household duties and shopping  weakness trouble walking  HFU  HFU    Family History  Problem Relation Age of Onset  . Diabetes Mellitus II Mother   . Hypertension Mother   . Diabetes Mellitus II Father   . High blood pressure Father    Social History   Socioeconomic History  . Marital status: Single    Spouse name: Not on file  . Number of children: Not on file  . Years of education: Not on file  . Highest education level: Not on file  Occupational History  . Occupation: Risk manager  Tobacco Use  . Smoking status: Never Smoker  . Smokeless tobacco: Never Used  Vaping Use  . Vaping Use: Never used  Substance and Sexual Activity  . Alcohol use: No  . Drug use: No  . Sexual activity: Yes    Birth control/protection: None  Other Topics Concern  . Not on file  Social History Narrative  . Not on file   Social Determinants  of Health   Financial Resource Strain: Not on file  Food Insecurity: Not on file  Transportation Needs: Not on file  Physical Activity: Not on file  Stress: Not on file  Social Connections: Not on file   Past Surgical History:  Procedure Laterality Date  . TONSILLECTOMY    . WOUND DEBRIDEMENT N/A 06/29/2020   Procedure: DEBRIDEMENT SACRAL WOUND;  Surgeon: Axel Filler, MD;  Location: [facility] OR;  Service: General;  Laterality: N/A;   Past Medical History:  Diagnosis Date  . Acute blood loss anemia   . Critical illness myopathy   . Diabetes  mellitus without complication (HCC)   . Hearing loss in right ear   . Hypertension   . Sleep apnea    BP (!) 119/93   Pulse 96   Temp 98 F (36.7 C)   Ht 5\' 10"  (1.778 m)   Wt (!) 341 lb (154.7 kg)   SpO2 95%   BMI 48.93 kg/m   Opioid Risk Score:   Fall Risk Score:  `1  Depression screen PHQ 2/9  Depression screen PHQ 2/9 07/19/2020  Decreased Interest 2  Down, Depressed, Hopeless 0  PHQ - 2 Score 2  Altered sleeping 3  Tired, decreased energy 2  Change in appetite 1  Feeling bad or failure about yourself  1  Trouble concentrating 0  Moving slowly or fidgety/restless 0  Suicidal thoughts 0  PHQ-9 Score 9  Difficult doing work/chores Very difficult    Review of Systems     Objective:   Physical Exam Vitals and nursing note reviewed.  Constitutional:      Appearance: Normal appearance.  Cardiovascular:     Rate and Rhythm: Normal rate and regular rhythm.     Pulses: Normal pulses.     Heart sounds: Normal heart sounds.  Pulmonary:     Effort: Pulmonary effort is normal.     Breath sounds: Normal breath sounds.  Musculoskeletal:     Cervical back: Normal range of motion and neck supple.     Comments: Normal Muscle Bulk and Muscle Testing Reveals:  Upper Extremities: Right: Full ROM and Muscle Strength 5/5 Left Upper Extremity: Decreased ROM and Muscle Strength 0/5 Lumbar Hypersensitivity Lower Extremities: Decreased ROM and Muscle Strength 4/5 Arrived in wheelchair.   Skin:    General: Skin is warm and dry.     Comments: Sacral Dressing Intact  Neurological:     Mental Status: He is alert and oriented to person, place, and time.  Psychiatric:        Mood and Affect: Mood normal.        Behavior: Behavior normal.           Assessment & Plan:  1.Critical Illness Myopathy: Continue Home Health Therapy with [facility] 2. 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  Continue to Monitor.  3. Essential Hypertension: Continue current medication regimen. Continue to monitor. He has a scheduled appointment with PCP.  4. Diabetes Mellitus New Onset.Continue current medication regimen and he has a scheduled appointment with PCP.  5. Sacral Wound: He has an appointment with [facility]: Wound Center.  RX: Oxycontin 20 mg 12 hr tablet one tablet every 12 hours #60 and Oxycodone 5 mh one tablet every 6 hours #120.  We will continue the opioid monitoring program, this consists of regular clinic visits, examinations, urine drug screen, pill counts as well as use of 14/04/2020 Controlled Substance Reporting system.  A 12 month History has been reviewed on the [place] Controlled Substance Reporting System on 07/19/2020.   F/U in 4- 6 weeks with Dr Carlis Abbott

## 2020-07-23 ENCOUNTER — Encounter: Payer: Self-pay | Admitting: Registered Nurse

## 2020-07-24 ENCOUNTER — Telehealth: Payer: Self-pay

## 2020-07-24 NOTE — Telephone Encounter (Signed)
Esther with [facility] called to request PT, NS & OT twice a week for 7 weeks. Per protocol verbal order given.

## 2020-07-25 ENCOUNTER — Ambulatory Visit (INDEPENDENT_AMBULATORY_CARE_PROVIDER_SITE_OTHER): Payer: BC Managed Care – PPO | Admitting: Neurology

## 2020-07-25 DIAGNOSIS — R202 Paresthesia of skin: Secondary | ICD-10-CM

## 2020-07-26 ENCOUNTER — Ambulatory Visit: Payer: Self-pay | Admitting: Adult Health

## 2020-07-26 LAB — DRUG TOX MONITOR 1 W/CONF, ORAL FLD
Amphetamines: NEGATIVE ng/mL (ref <10)
Barbiturates: NEGATIVE ng/mL (ref <10)
Benzodiazepines: NEGATIVE ng/mL (ref <0.50)
Buprenorphine: 1.18 ng/mL — ABNORMAL HIGH (ref <0.10)
Buprenorphine: POSITIVE ng/mL — AB (ref <0.10)
Cocaine: NEGATIVE ng/mL (ref <5.0)
Codeine: NEGATIVE ng/mL (ref <2.5)
Dihydrocodeine: NEGATIVE ng/mL (ref <2.5)
Fentanyl: NEGATIVE ng/mL (ref <0.10)
Heroin Metabolite: NEGATIVE ng/mL (ref <1.0)
Hydrocodone: NEGATIVE ng/mL (ref <2.5)
Hydromorphone: NEGATIVE ng/mL (ref <2.5)
MARIJUANA: NEGATIVE ng/mL (ref <2.5)
MDMA: NEGATIVE ng/mL (ref <10)
Meprobamate: NEGATIVE ng/mL (ref <2.5)
Methadone: NEGATIVE ng/mL (ref <5.0)
Morphine: NEGATIVE ng/mL (ref <2.5)
Naloxone: 1.33 ng/mL — ABNORMAL HIGH (ref <0.25)
Nicotine Metabolite: NEGATIVE ng/mL (ref <5.0)
Norbuprenorphine: NEGATIVE ng/mL (ref <0.50)
Norhydrocodone: NEGATIVE ng/mL (ref <2.5)
Noroxycodone: 12.2 ng/mL — ABNORMAL HIGH (ref <2.5)
Opiates: POSITIVE ng/mL — AB (ref <2.5)
Oxycodone: 136.4 ng/mL — ABNORMAL HIGH (ref <2.5)
Oxymorphone: NEGATIVE ng/mL (ref <2.5)
Phencyclidine: NEGATIVE ng/mL (ref <10)
Tapentadol: NEGATIVE ng/mL (ref <5.0)
Tramadol: NEGATIVE ng/mL (ref <5.0)
Zolpidem: NEGATIVE ng/mL (ref <5.0)

## 2020-07-26 LAB — DRUG TOX ALC METAB W/CON, ORAL FLD: Alcohol Metabolite: NEGATIVE ng/mL (ref <25)

## 2020-07-27 NOTE — Procedures (Signed)
[facility] Neurology  [address] [place], [address]  [place], [place] [address] Tel: [phone] Fax:  [phone] Test Date:  07/25/2020  Patient: Collin Brown DOB: [birth year] Physician: Nita Sickle, DO  Sex: Male Height: 5\' 10"  Ref Phys: , MD  ID#: Osvaldo Shipper   Technician:    Patient Complaints: This is a 34 year old man referred for evaluation of left arm weakness and pain.  NCV & EMG Findings: Electrodiagnostic testing was prematurely terminated at patient's request due to pain.  Findings are limited and show absent left ulnar and radial sensory responses.  Left median sensory response shows reduced amplitude (16.8 V).  Left median motor response was unable to be assessed due to intolerance of testing.   Impression: This is an incomplete study, as nerve conduction study was terminated due to pain.     ___________________________ 32, DO    Nerve Conduction Studies Anti Sensory Summary Table   Stim Site NR Peak (ms) Norm Peak (ms) P-T Amp (V) Norm P-T Amp  Left Median Anti Sensory (2nd Digit)  Wrist    3.3 <3.4 16.8 >20  Left Radial Anti Sensory (Base 1st Digit)  32C  Wrist NR  <2.7  >18  Left Ulnar Anti Sensory (5th Digit)  32C  Wrist NR  <3.1  >12   Motor Summary Table   Stim Site NR Onset (ms) Norm Onset (ms) O-P Amp (mV) Norm O-P Amp Site1 Site2 Delta-0 (ms) Dist (cm) Vel (m/s) Norm Vel (m/s)  Left Median Motor (Abd Poll Brev)  32C    Paient did not tolerate proximal stimulation  Wrist    4.3 <3.9 2.8 >6 Elbow Wrist 9.5 0.0  >50  Elbow    13.8  0.1             Waveforms:

## 2020-07-31 ENCOUNTER — Telehealth: Payer: Self-pay | Admitting: *Deleted

## 2020-07-31 DIAGNOSIS — Z5181 Encounter for therapeutic drug level monitoring: Secondary | ICD-10-CM

## 2020-07-31 DIAGNOSIS — Z79891 Long term (current) use of opiate analgesic: Secondary | ICD-10-CM

## 2020-07-31 DIAGNOSIS — G894 Chronic pain syndrome: Secondary | ICD-10-CM

## 2020-07-31 NOTE — Telephone Encounter (Signed)
I have let him know. He denies taking anything else but the test is conclusive. I have advised him that you will be writing a wean down schedule for him off his narcotics.

## 2020-07-31 NOTE — Telephone Encounter (Signed)
As I recall from the hospital, Collin Brown is on high doses opioids. If alcohol screen is inconsistent please let patient know we will not be able to prescribe narcotics as he may need to wean down on current medications, thank you!

## 2020-07-31 NOTE — Telephone Encounter (Signed)
Oral drug screen is inconsistent.  He is prescribed oxycodone which is present, but is also positive for buprenorphine and naloxone which is Suboxone, which is not prescribed per the PMP.

## 2020-07-31 NOTE — Telephone Encounter (Signed)
If you could let her know I would be ok with him coming in for a repeat urine study, that would be great

## 2020-07-31 NOTE — Telephone Encounter (Signed)
I received a call from Collin Brown's mother who is on his DPR.  She is denying any possibility that Collin Brown has taken Suboxone or components of the medication.  She has admitted to giving him her gabapentin which is a higher dose than what he is taking (600 mg vs 400 mg) and I have cautioned her that he is strictly not to be taking medication that is not his own and they should not escalate his dose or frequency without the permission of the MD.  He is to take medication as prescribed.  She still insists there is no way this medication was in his system. I have called the toxicologist at [facility] and the drug was quantified and there is 99.7% accuracy and the drug was definitely present. His mother offered to bring him in for a new drug test but I explained to her that it is not our policy to do drug test on demand/or repetitions. I have explained to her that I will pass this information on to Dr Carlis Abbott and ultimately it is her call on how she wants to proceed.

## 2020-08-06 NOTE — Telephone Encounter (Signed)
I have notified his mother that he can retest, but he has to do so within 24 hours of our call.  She reports he will come early tomorrow to go to lab.  I will have papers up front with a urinal to take to the lab since she reports he cannot use a little cup.

## 2020-08-13 ENCOUNTER — Other Ambulatory Visit: Payer: Self-pay

## 2020-08-13 ENCOUNTER — Other Ambulatory Visit: Payer: Self-pay | Admitting: Physical Medicine and Rehabilitation

## 2020-08-13 DIAGNOSIS — R202 Paresthesia of skin: Secondary | ICD-10-CM

## 2020-08-13 NOTE — Telephone Encounter (Signed)
Refill request from pharmacy for Potassium Chloride and Metoprolol Tartrate. Patient has appt 09/06/20. Is this okay to refill or send to PCP?

## 2020-08-14 ENCOUNTER — Ambulatory Visit: Payer: Self-pay

## 2020-08-14 LAB — TOXASSURE SELECT,+ANTIDEPR,UR

## 2020-08-14 MED ORDER — POTASSIUM CHLORIDE CRYS ER 20 MEQ PO TBCR: 40.0000 meq | EXTENDED_RELEASE_TABLET | Freq: Two times a day (BID) | ORAL | 0 refills | Status: DC

## 2020-08-14 MED ORDER — METOPROLOL TARTRATE 75 MG PO TABS: 75.0000 mg | ORAL_TABLET | Freq: Two times a day (BID) | ORAL | 0 refills | Status: DC

## 2020-08-14 NOTE — Telephone Encounter (Signed)
Ok to refill 

## 2020-08-14 NOTE — Telephone Encounter (Signed)
Refill sent in

## 2020-08-14 NOTE — Telephone Encounter (Signed)
[name] called, on DPR, and says the patient's left arm has been swollen since he was in the ICU in September. She says the home health nurses and PT has told her to contact the office for an appointment. She says the arm is painful at night mostly to the point he is crying. She says the arm is swollen from the shoulder to the fingers. She says the arm is not red, but it has bruises on it in various places. She says he keeps his arm on a pillow and was told he would never use it the same again when he was in the hospital. She asked is there an earlier new patient appointment. I advised that he will need to have his new patient appointment first before any other appointment is scheduled. Other symptoms denied. She asked if I could see if there would be an earlier appointment. I looked an rescheduled for Friday, 08/17/20 at 1540 with [name], FNP. Care advice given, she verbalized understanding.  Reason for Disposition . [1] Small area of LOCALIZED swelling AND [2] not better after 3 days  Answer Assessment - Initial Assessment Questions 1. ONSET: "When did the swelling start?" (e.g., minutes, hours, days)     While in the hospital in the ICU in September 2. LOCATION: "What part of the arm is swollen?"  "Are both arms swollen or just one arm?"     Left arm all the way from the shoulder to tips of fingers 3. SEVERITY: "How bad is the swelling?" (e.g., localized; mild, moderate, severe)   - LOCALIZED: Small area of puffiness or swelling on just one arm   - JOINT SWELLING: Swelling of one joint   - MILD: Puffiness or swelling of hand   - MODERATE: Puffiness or swollen feeling of entire arm    - SEVERE: All of arm looks swollen; pitting edema     Moderate 4. REDNESS: "Does the swelling look red or infected?"     No, looks bruised 5. PAIN: "Is the swelling painful to touch?" If Yes, ask: "How painful is it?"   (Scale 1-10; mild, moderate or severe)     Unbearable at night 6. FEVER: "Do  you have a fever?" If Yes, ask: "What is it, how was it measured, and when did it start?"      No 7. CAUSE: "What do you think is causing the arm swelling?"     No idea 8. MEDICAL HISTORY: "Do you have a history of heart failure, kidney disease, liver failure, or cancer?"     No 9. RECURRENT SYMPTOM: "Have you had arm swelling before?" If Yes, ask: "When was the last time?" "What happened that time?"     No 10. OTHER SYMPTOMS: "Do you have any other symptoms?" (e.g., chest pain, difficulty breathing)    No   11. PREGNANCY: "Is there any chance you are pregnant?" "When was your last menstrual period?"      N/A  Protocols used: ARM SWELLING AND EDEMA-A-AH

## 2020-08-14 NOTE — Addendum Note (Signed)
Addended by: Silas Sacramento T on: 08/14/2020 01:06 PM   Modules accepted: Orders

## 2020-08-15 ENCOUNTER — Telehealth: Payer: Self-pay | Admitting: *Deleted

## 2020-08-15 ENCOUNTER — Encounter: Payer: BC Managed Care – PPO | Admitting: Neurology

## 2020-08-15 NOTE — Telephone Encounter (Signed)
Urine drug screen for this encounter is consistent for prescribed medication 

## 2020-08-16 ENCOUNTER — Encounter (HOSPITAL_BASED_OUTPATIENT_CLINIC_OR_DEPARTMENT_OTHER): Payer: BC Managed Care – PPO | Attending: Internal Medicine | Admitting: Internal Medicine

## 2020-08-16 ENCOUNTER — Other Ambulatory Visit: Payer: Self-pay | Admitting: Physical Medicine and Rehabilitation

## 2020-08-16 ENCOUNTER — Other Ambulatory Visit: Payer: Self-pay

## 2020-08-16 DIAGNOSIS — Z8249 Family history of ischemic heart disease and other diseases of the circulatory system: Secondary | ICD-10-CM | POA: Diagnosis not present

## 2020-08-16 DIAGNOSIS — G7281 Critical illness myopathy: Secondary | ICD-10-CM | POA: Diagnosis not present

## 2020-08-16 DIAGNOSIS — E119 Type 2 diabetes mellitus without complications: Secondary | ICD-10-CM | POA: Insufficient documentation

## 2020-08-16 DIAGNOSIS — L89154 Pressure ulcer of sacral region, stage 4: Secondary | ICD-10-CM | POA: Diagnosis present

## 2020-08-16 DIAGNOSIS — Z6841 Body Mass Index (BMI) 40.0 and over, adult: Secondary | ICD-10-CM | POA: Insufficient documentation

## 2020-08-16 DIAGNOSIS — Z833 Family history of diabetes mellitus: Secondary | ICD-10-CM | POA: Insufficient documentation

## 2020-08-16 DIAGNOSIS — I10 Essential (primary) hypertension: Secondary | ICD-10-CM | POA: Diagnosis not present

## 2020-08-16 DIAGNOSIS — Z8616 Personal history of COVID-19: Secondary | ICD-10-CM | POA: Diagnosis not present

## 2020-08-16 MED ORDER — OXYCODONE HCL ER 20 MG PO T12A: 20.0000 mg | EXTENDED_RELEASE_TABLET | Freq: Two times a day (BID) | ORAL | 0 refills | Status: DC

## 2020-08-16 MED ORDER — OXYCODONE HCL 5 MG PO TABS: 5.0000 mg | ORAL_TABLET | Freq: Four times a day (QID) | ORAL | 0 refills | Status: DC | PRN

## 2020-08-17 ENCOUNTER — Encounter: Payer: Self-pay | Admitting: Adult Health

## 2020-08-17 ENCOUNTER — Other Ambulatory Visit: Payer: Self-pay

## 2020-08-17 ENCOUNTER — Ambulatory Visit
Admission: RE | Admit: 2020-08-17 | Discharge: 2020-08-17 | Disposition: A | Discharge status: Home / Self Care | Payer: BC Managed Care – PPO | Source: Ambulatory Visit | Attending: Adult Health | Admitting: Adult Health

## 2020-08-17 ENCOUNTER — Ambulatory Visit (INDEPENDENT_AMBULATORY_CARE_PROVIDER_SITE_OTHER): Payer: BC Managed Care – PPO | Admitting: Adult Health

## 2020-08-17 VITALS — BP 144/106 | HR 126 | Temp 97.8°F | Wt 365.0 lb

## 2020-08-17 DIAGNOSIS — U099 Post covid-19 condition, unspecified: Secondary | ICD-10-CM | POA: Diagnosis not present

## 2020-08-17 DIAGNOSIS — R0683 Snoring: Secondary | ICD-10-CM

## 2020-08-17 DIAGNOSIS — E119 Type 2 diabetes mellitus without complications: Secondary | ICD-10-CM

## 2020-08-17 DIAGNOSIS — Z8616 Personal history of COVID-19: Secondary | ICD-10-CM | POA: Diagnosis not present

## 2020-08-17 DIAGNOSIS — M7989 Other specified soft tissue disorders: Secondary | ICD-10-CM | POA: Diagnosis present

## 2020-08-17 DIAGNOSIS — R0600 Dyspnea, unspecified: Secondary | ICD-10-CM

## 2020-08-17 DIAGNOSIS — M25522 Pain in left elbow: Secondary | ICD-10-CM | POA: Insufficient documentation

## 2020-08-17 DIAGNOSIS — H6123 Impacted cerumen, bilateral: Secondary | ICD-10-CM

## 2020-08-17 IMAGING — CR DG CHEST 2V
1 series · 3 of 3 positions shown · non-contrast
Comparison: [DATE]

CLINICAL DATA: EMME is COPD.  Shortness of breath.

EXAM:
CHEST - 2 VIEW

[Series 1: dg chest 2 view · 0.14mm/px · 3 of 3 slices shown]
[im 1/3]
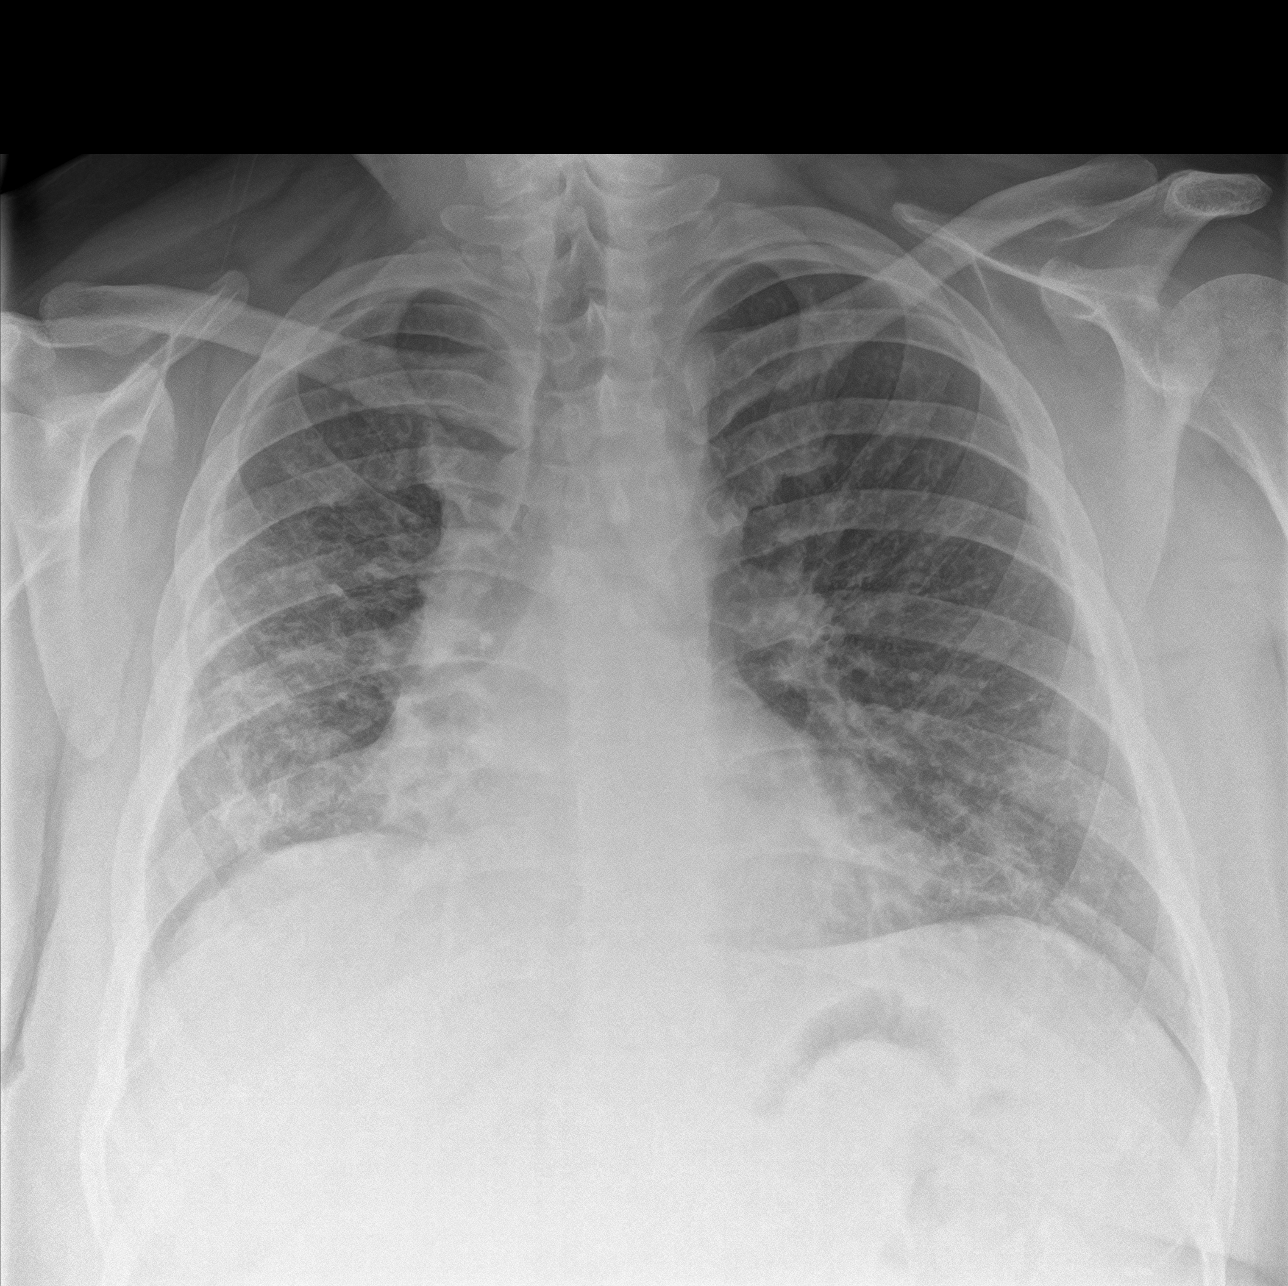
[im 2/3]
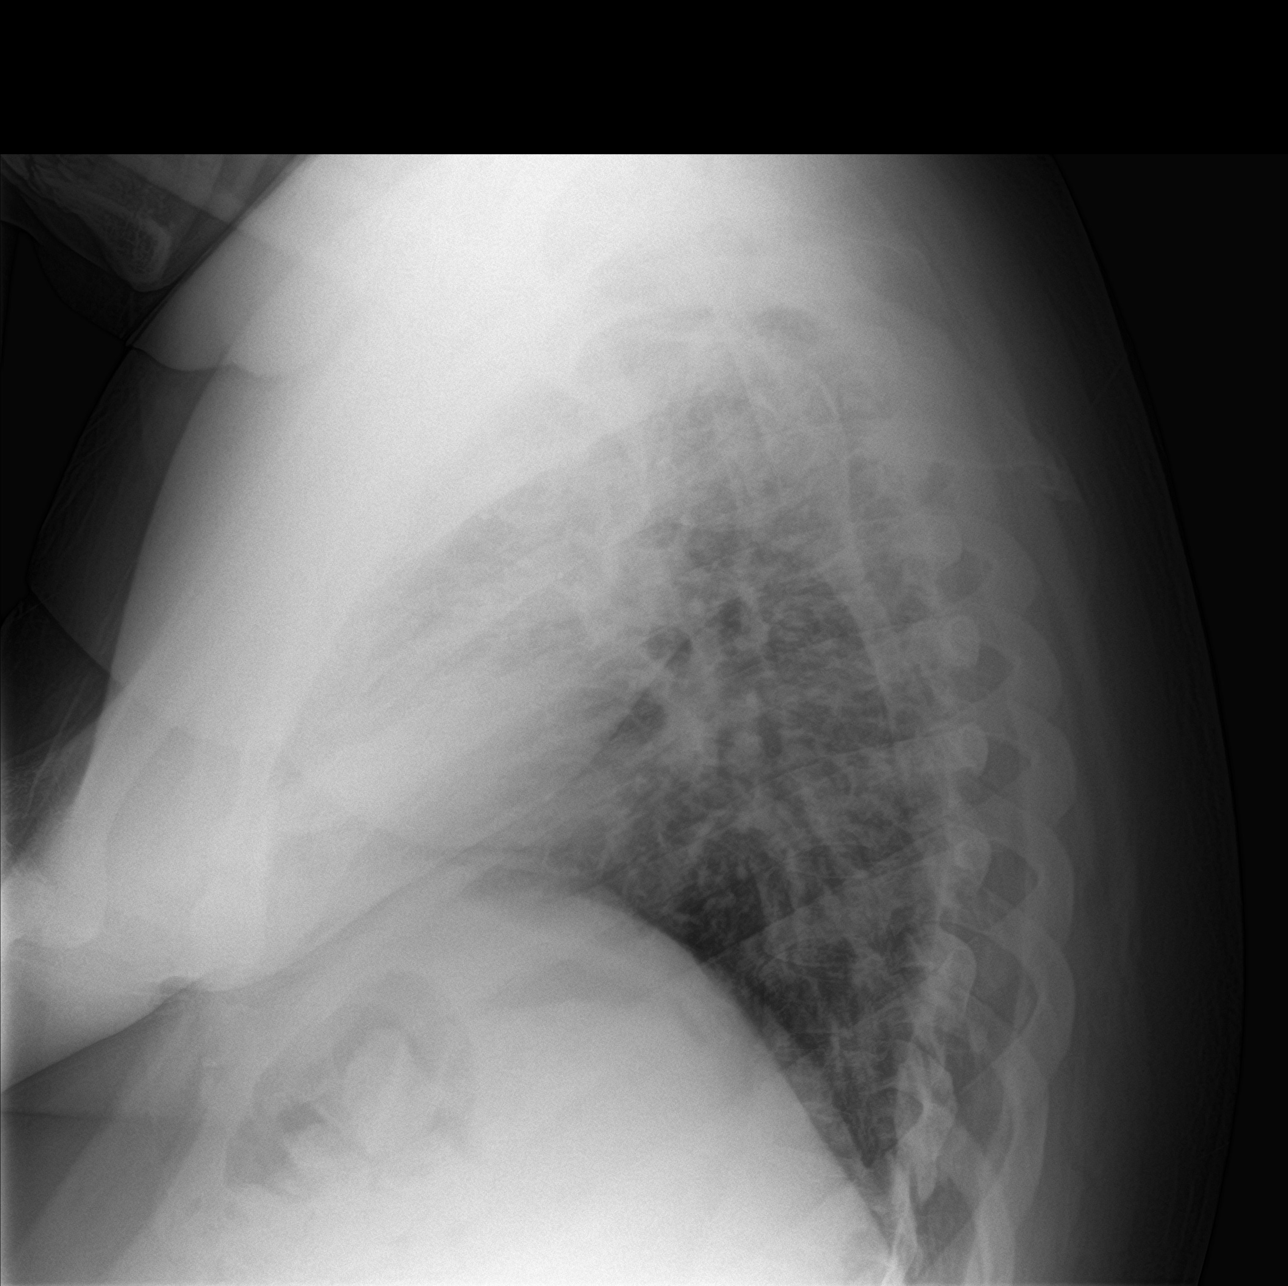
[im 3/3]
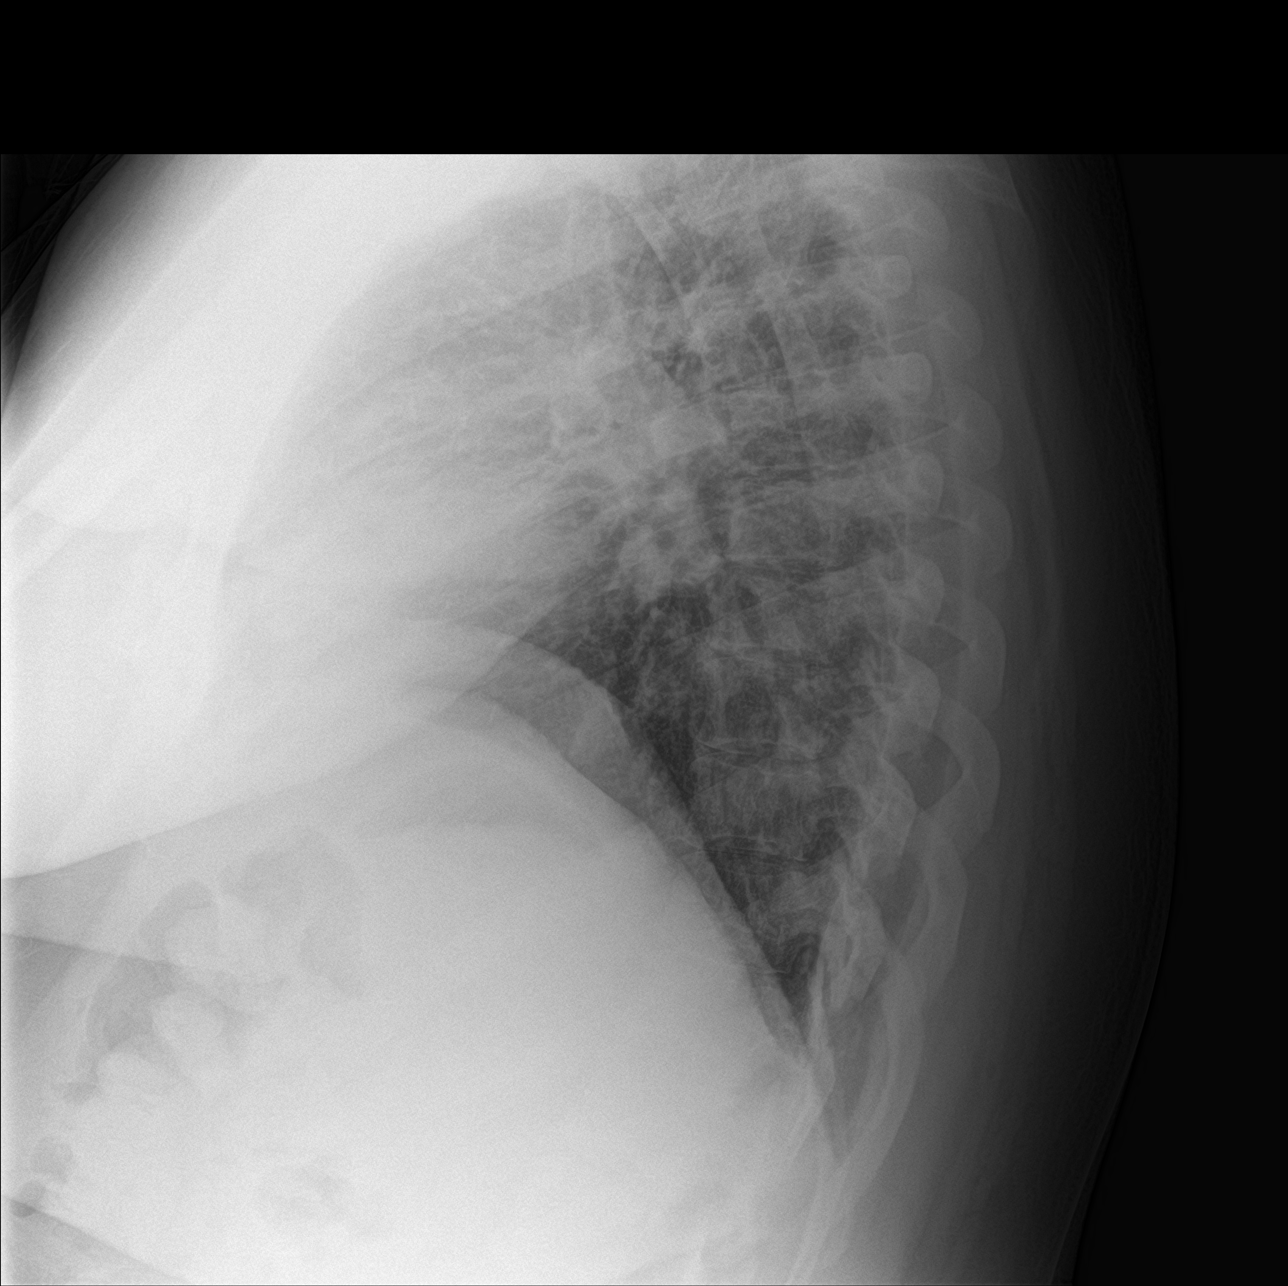

[3 of 3 positions shown; findings below may reference images not displayed]

FINDINGS: Infiltrate remains in the periphery of the right lower lung,
improved. There may be some mild scarring remaining in the left
base. The heart, hila, mediastinum, lungs, and pleura are otherwise
unchanged.
IMPRESSION: Opacity remains in the periphery of the right lung, probably
scarring from the patient's previous [Y9] infection. There may
be some mild scarring in the left base is well. No other acute
abnormalities.

## 2020-08-17 IMAGING — US US EXTREM  UP VENOUS*L*
1 series · 13 of 24 positions shown · non-contrast
Comparison: Ultrasound dated [DATE].

CLINICAL DATA: 34-year-old male with left upper extremity swelling.



[Series 1: us venous img upper uni left (dvt) · portal-venous · 13 of 42 slices shown]
[im 1/42]
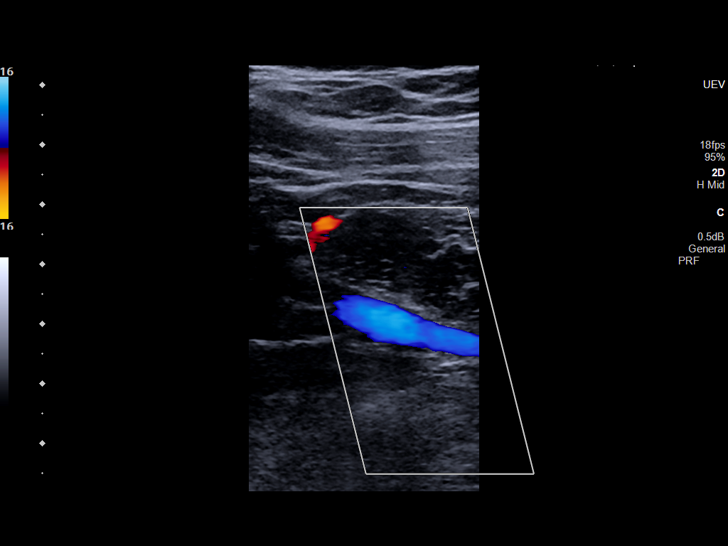
[im 4/42]
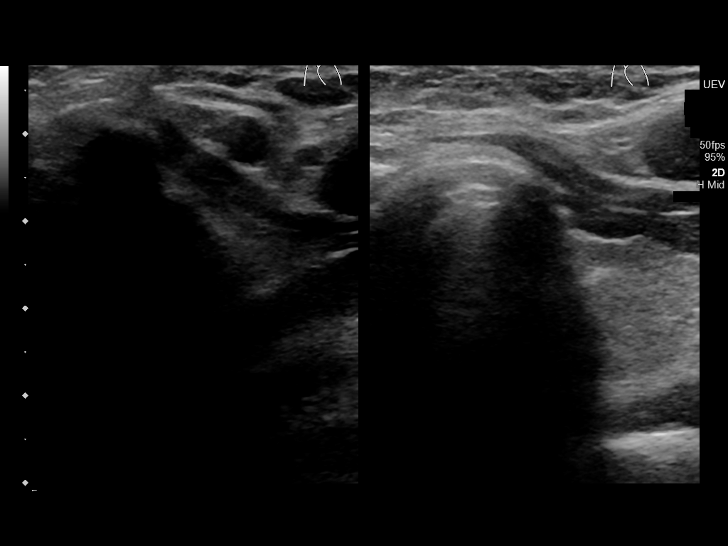
[im 8/42]
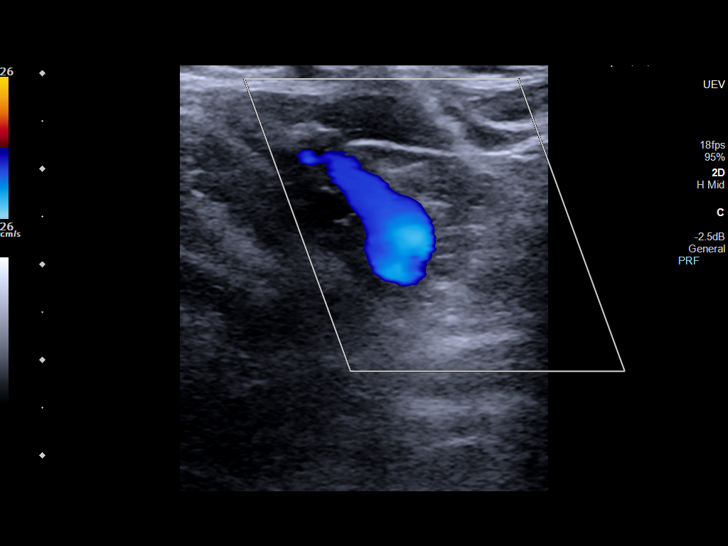
[im 11/42]
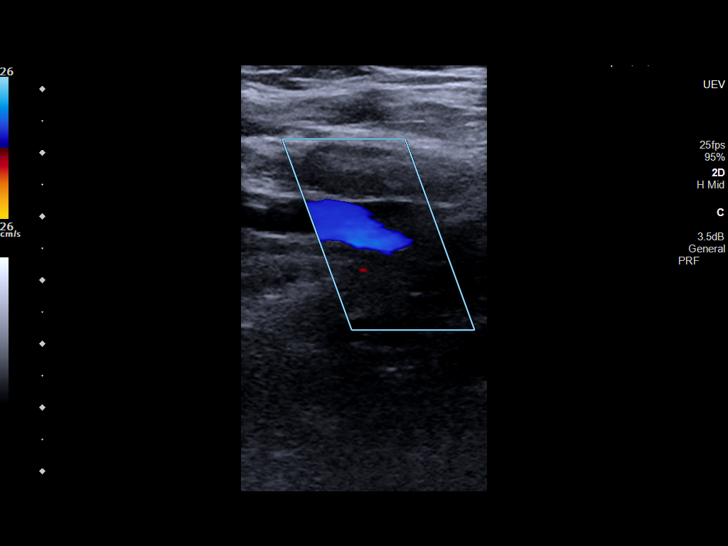
[im 15/42]
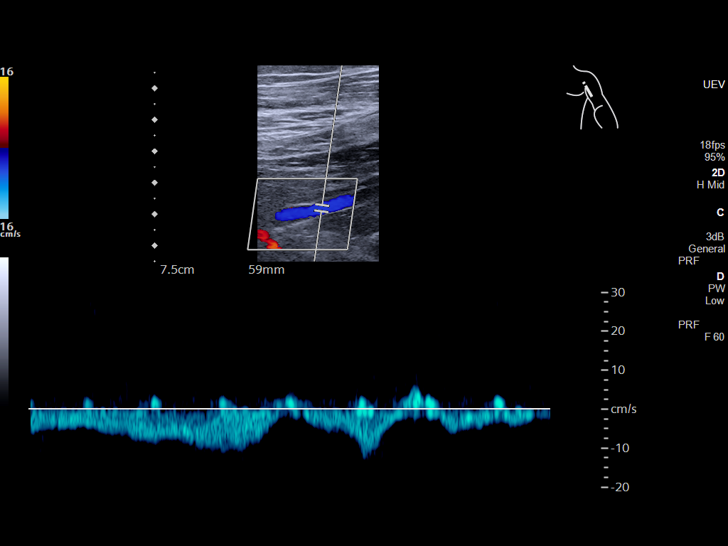
[im 18/42]
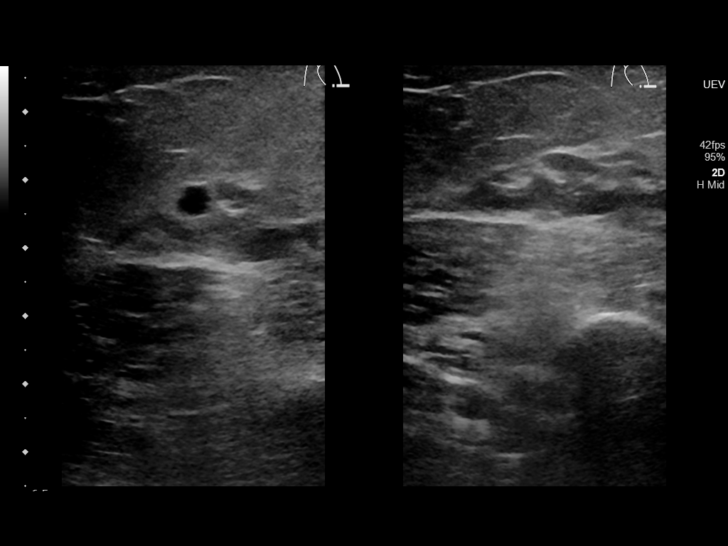
[im 22/42]
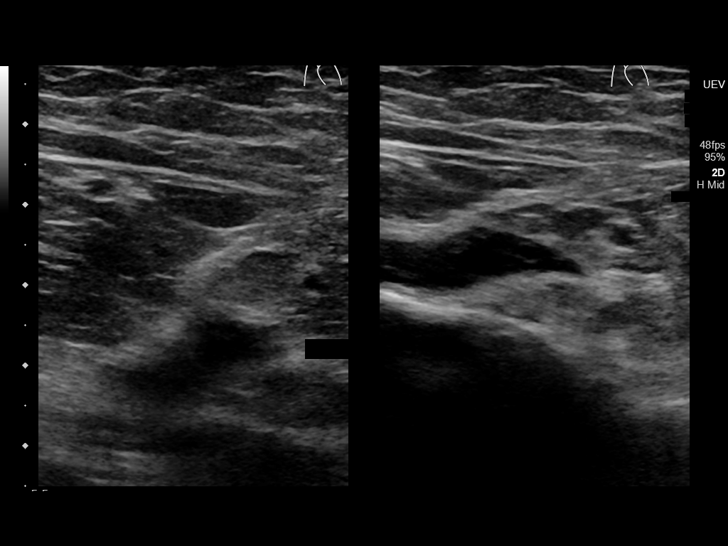
[im 24/42]
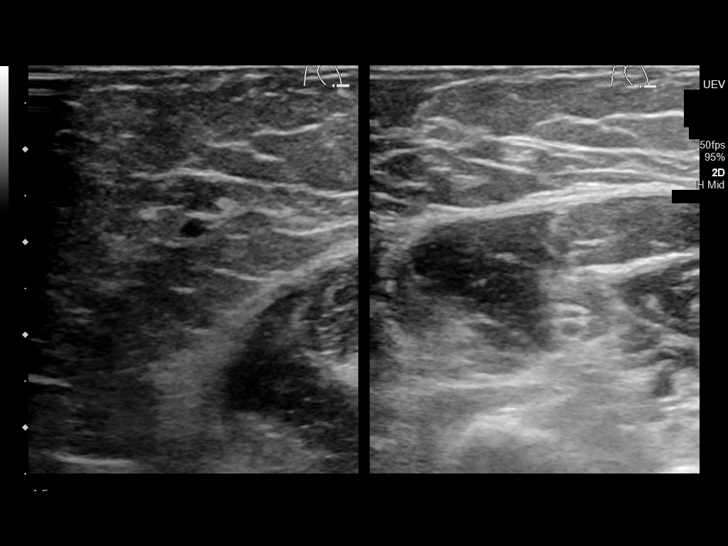
[im 27/42]
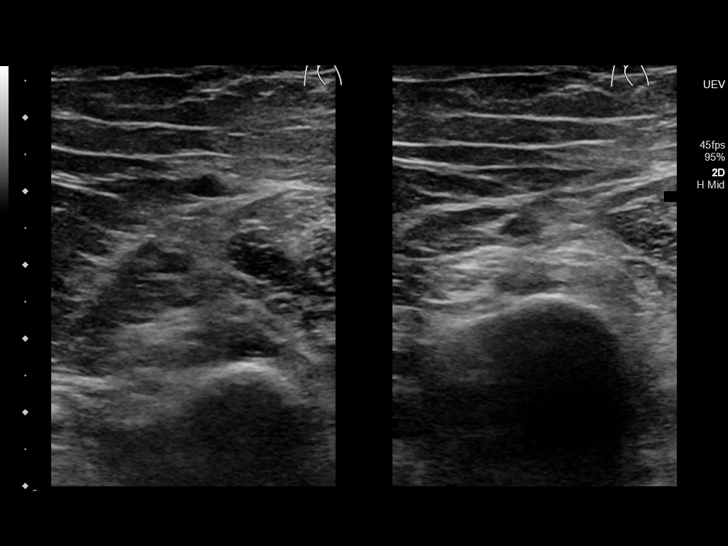
[im 31/42]
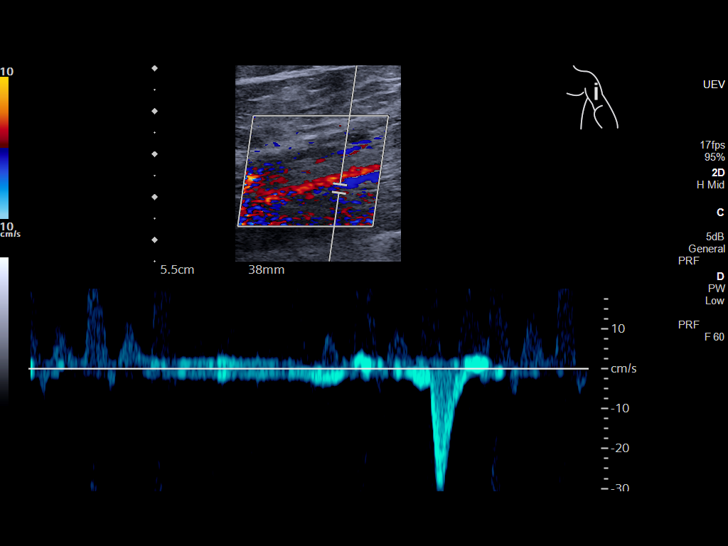
[im 34/42]
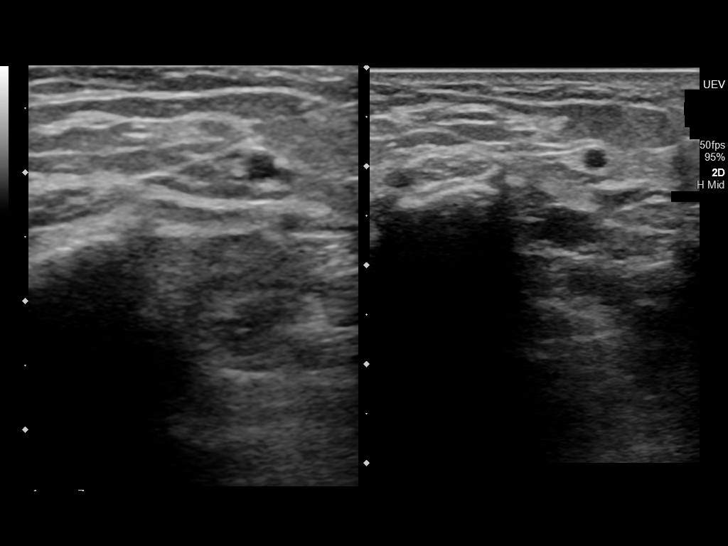
[im 38/42]
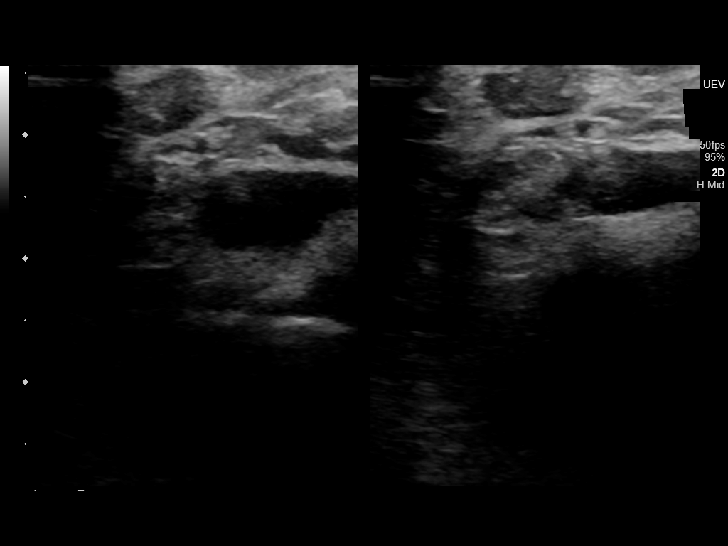
[im 42/42]
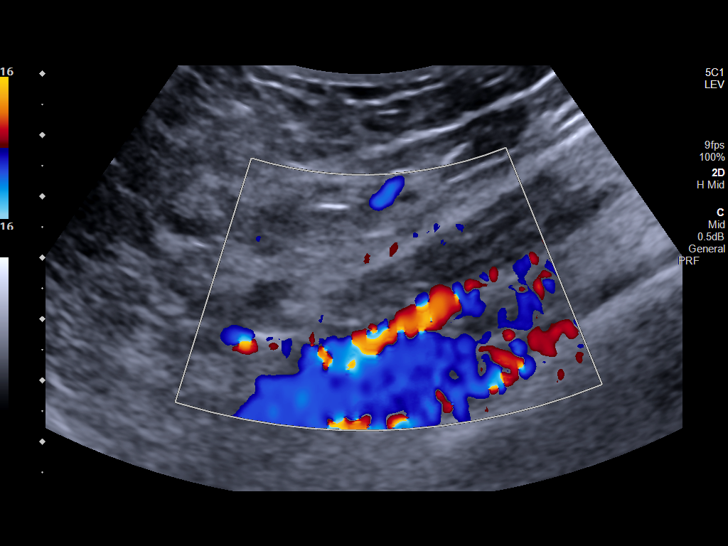

[13 of 24 positions shown; findings below may reference images not displayed]

FINDINGS: Contralateral Subclavian Vein: Respiratory phasicity is normal and
symmetric with the symptomatic side. No evidence of thrombus. Normal
compressibility.

Internal Jugular Vein: No evidence of thrombus. Normal
compressibility, respiratory phasicity and response to augmentation.

Subclavian Vein: No evidence of thrombus. Normal compressibility,
respiratory phasicity and response to augmentation.

Axillary Vein: No evidence of thrombus. Normal compressibility,
respiratory phasicity and response to augmentation.

Cephalic Vein: No evidence of thrombus. Normal compressibility,
respiratory phasicity and response to augmentation.

Basilic Vein: No evidence of thrombus. Normal compressibility,
respiratory phasicity and response to augmentation.

Brachial Veins: No evidence of thrombus. Normal compressibility,
respiratory phasicity and response to augmentation.

Radial Veins: No evidence of thrombus. Normal compressibility,
respiratory phasicity and response to augmentation.

Ulnar Veins: No evidence of thrombus. Normal compressibility,
respiratory phasicity and response to augmentation.

Venous Reflux:  None visualized.

Other Findings:  None visualized.
IMPRESSION: No evidence of DVT within the left upper extremity.

## 2020-08-17 IMAGING — CR DG HUMERUS 2V *L*
1 series · 3 of 3 positions shown · non-contrast
Comparison: None.

CLINICAL DATA: Pain

EXAM:
LEFT HUMERUS - 2+ VIEW

[Series 1: dg humerus left · 0.14mm/px · 3 of 3 slices shown]
[im 1/3]
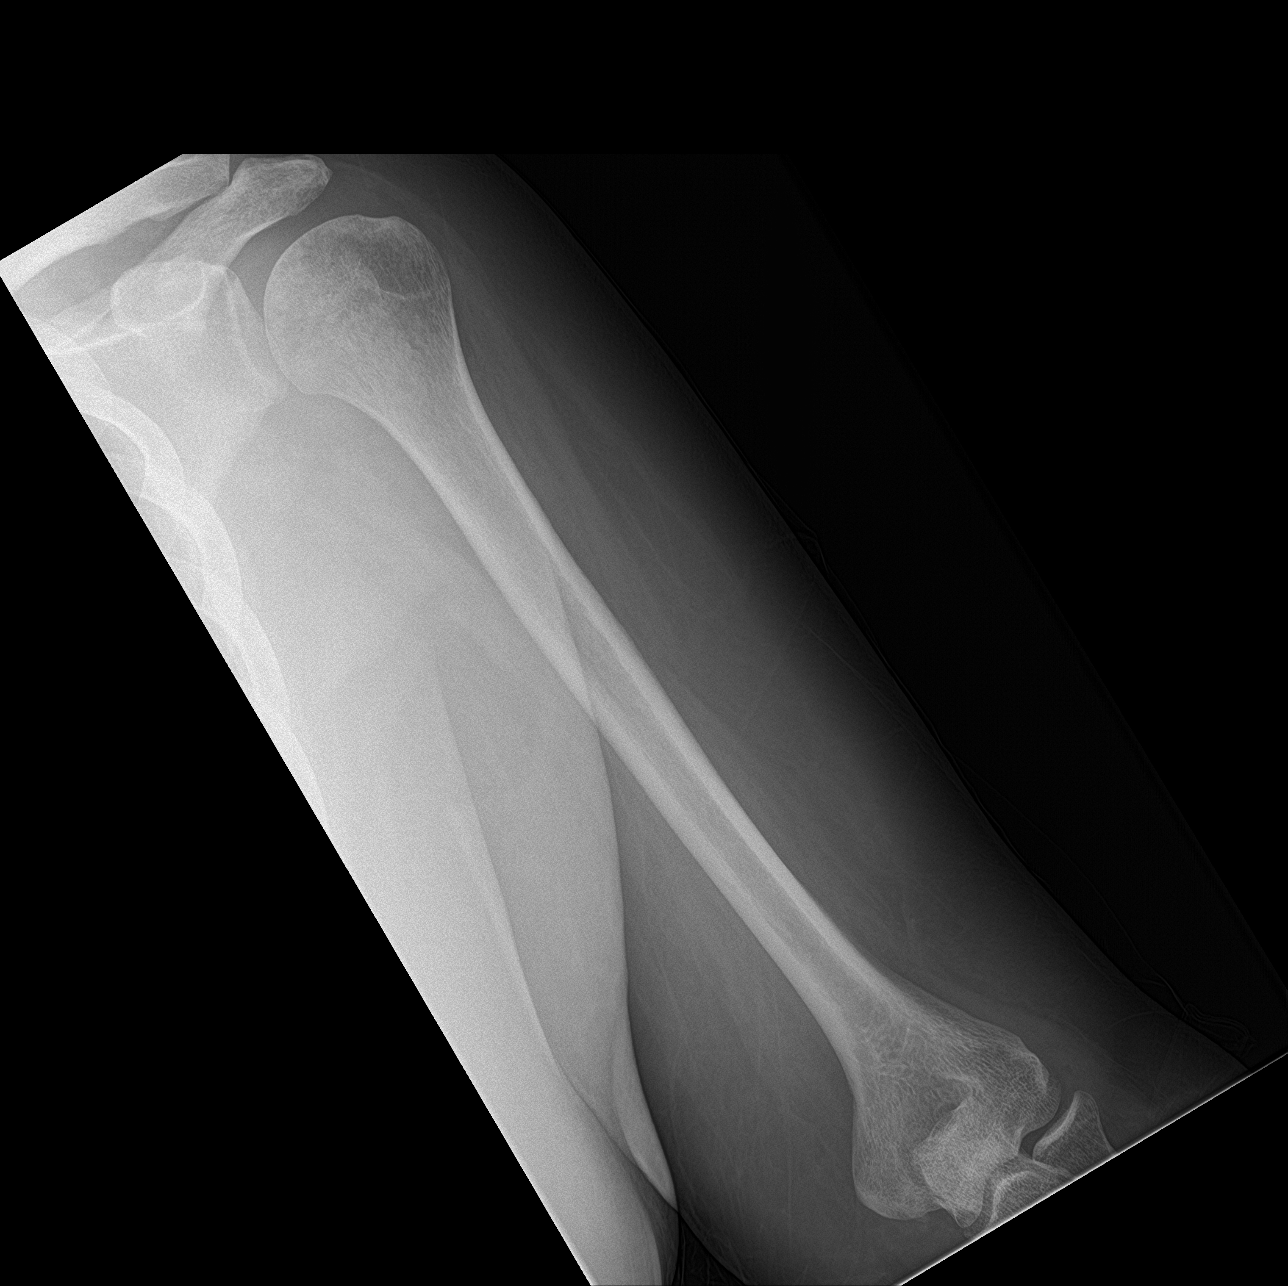
[im 2/3]
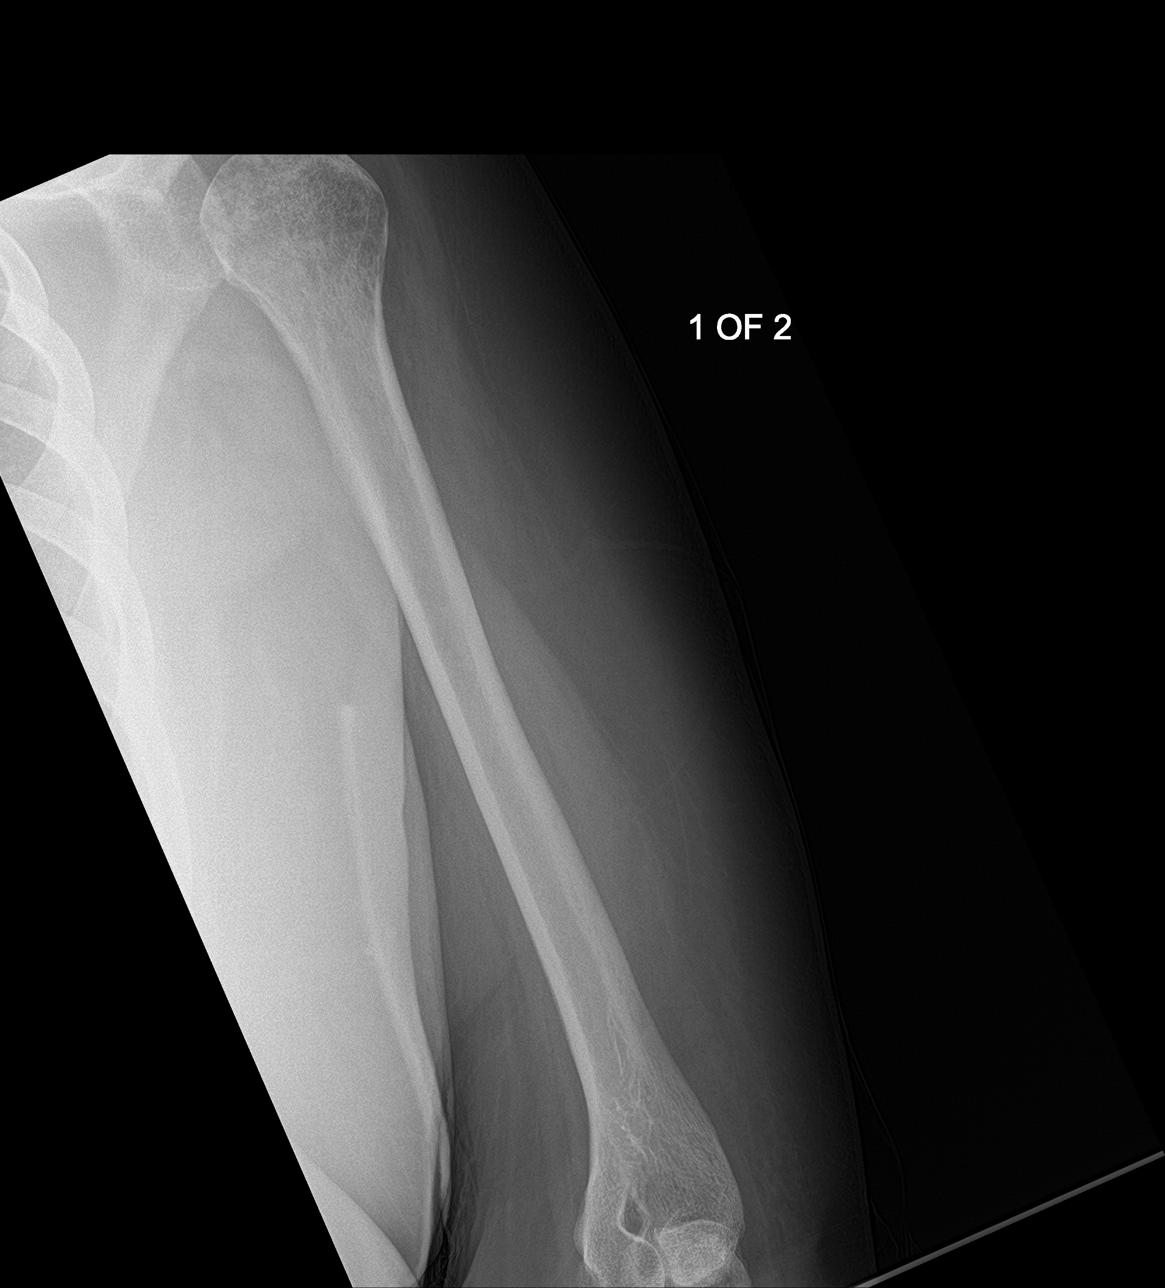
[im 3/3]
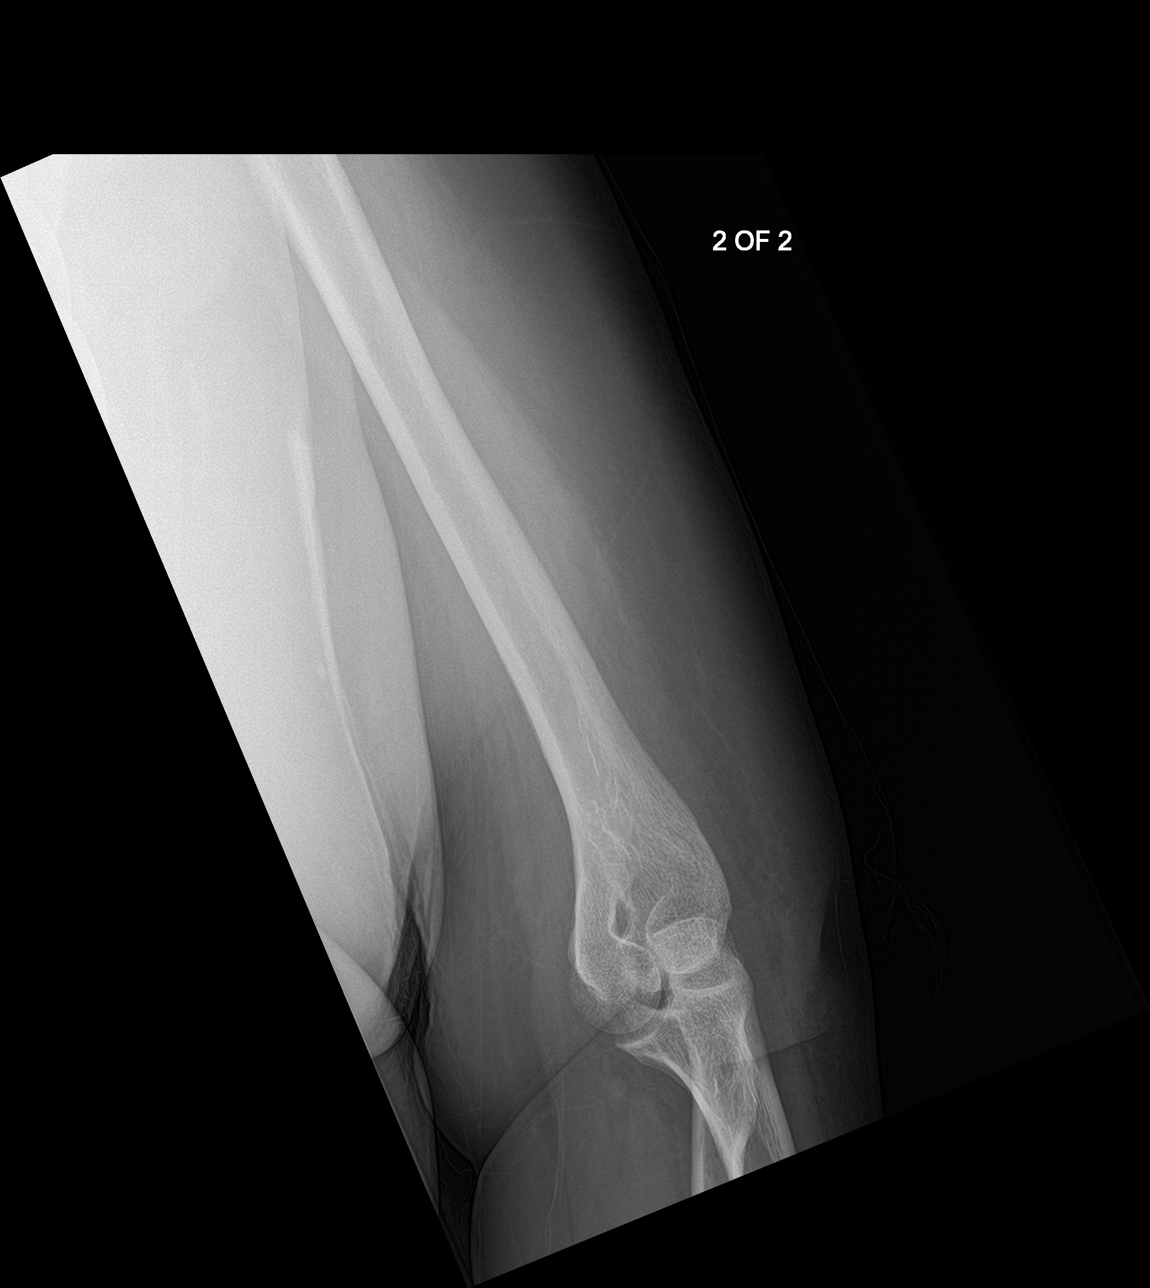

[3 of 3 positions shown; findings below may reference images not displayed]

FINDINGS: There is no evidence of fracture or other focal bone lesions. Soft
tissues are unremarkable.
IMPRESSION: Negative.

## 2020-08-17 IMAGING — CR DG HAND COMPLETE 3+V*L*
1 series · 3 of 3 positions shown · non-contrast
Comparison: None.

CLINICAL DATA: Pain

EXAM:
LEFT HAND - COMPLETE 3+ VIEW

[Series 1: dg hand complete left · 0.14mm/px · 3 of 3 slices shown]
[im 1/3]
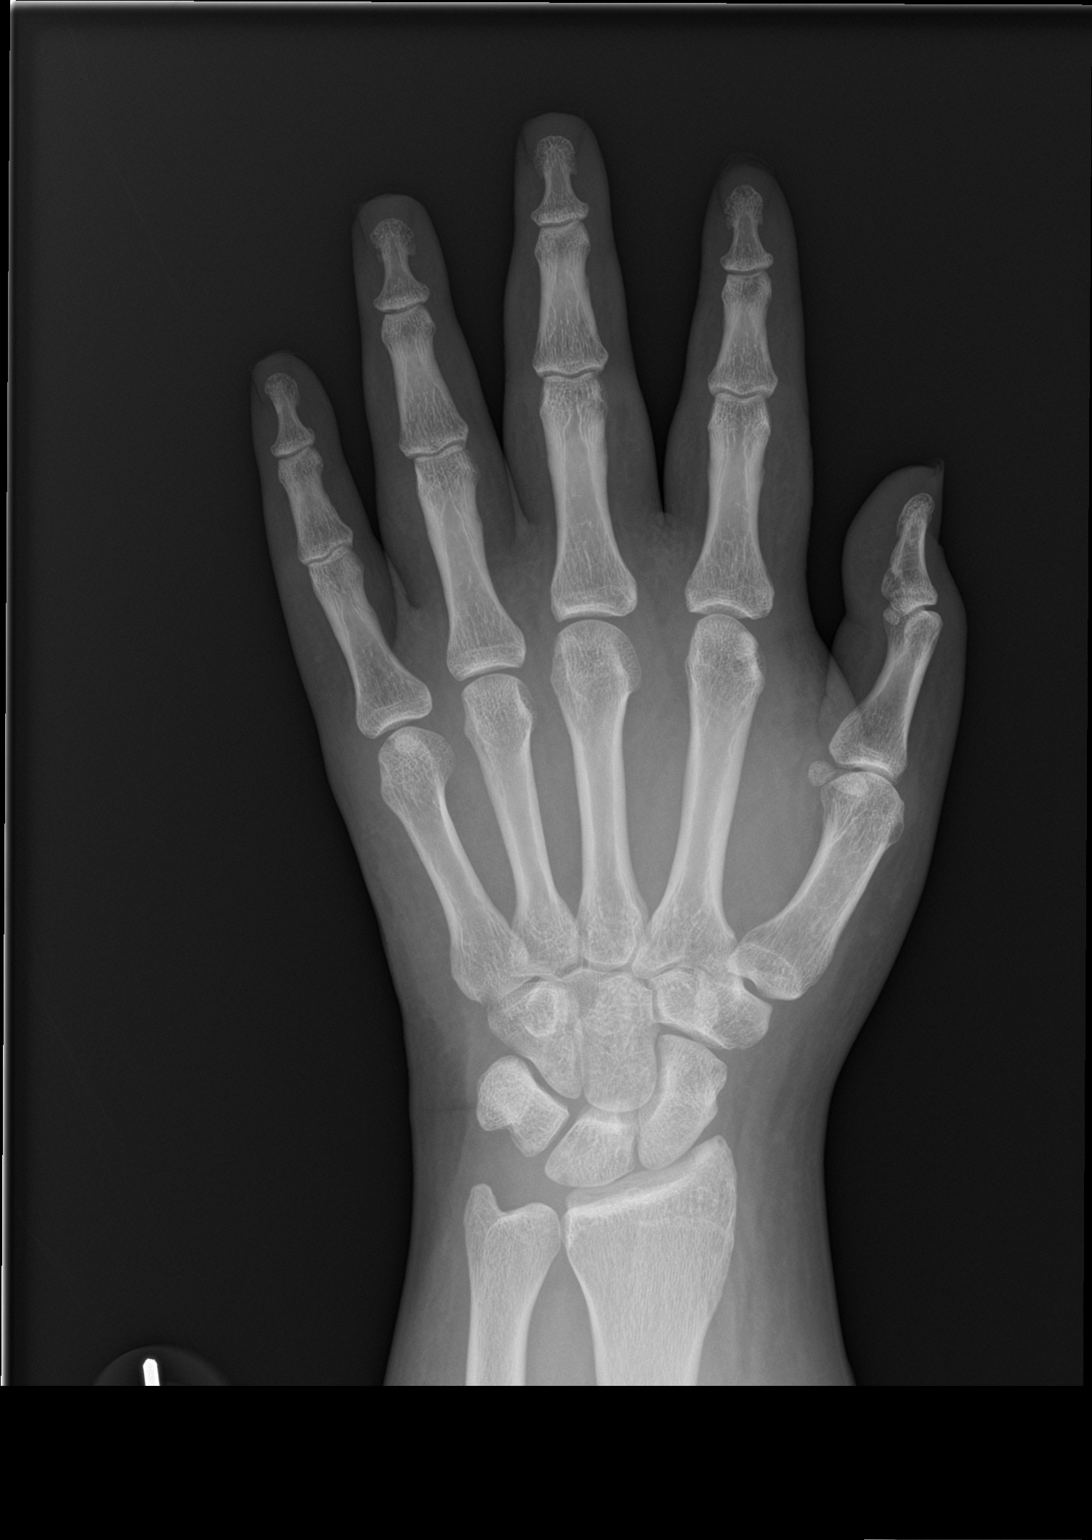
[im 2/3]
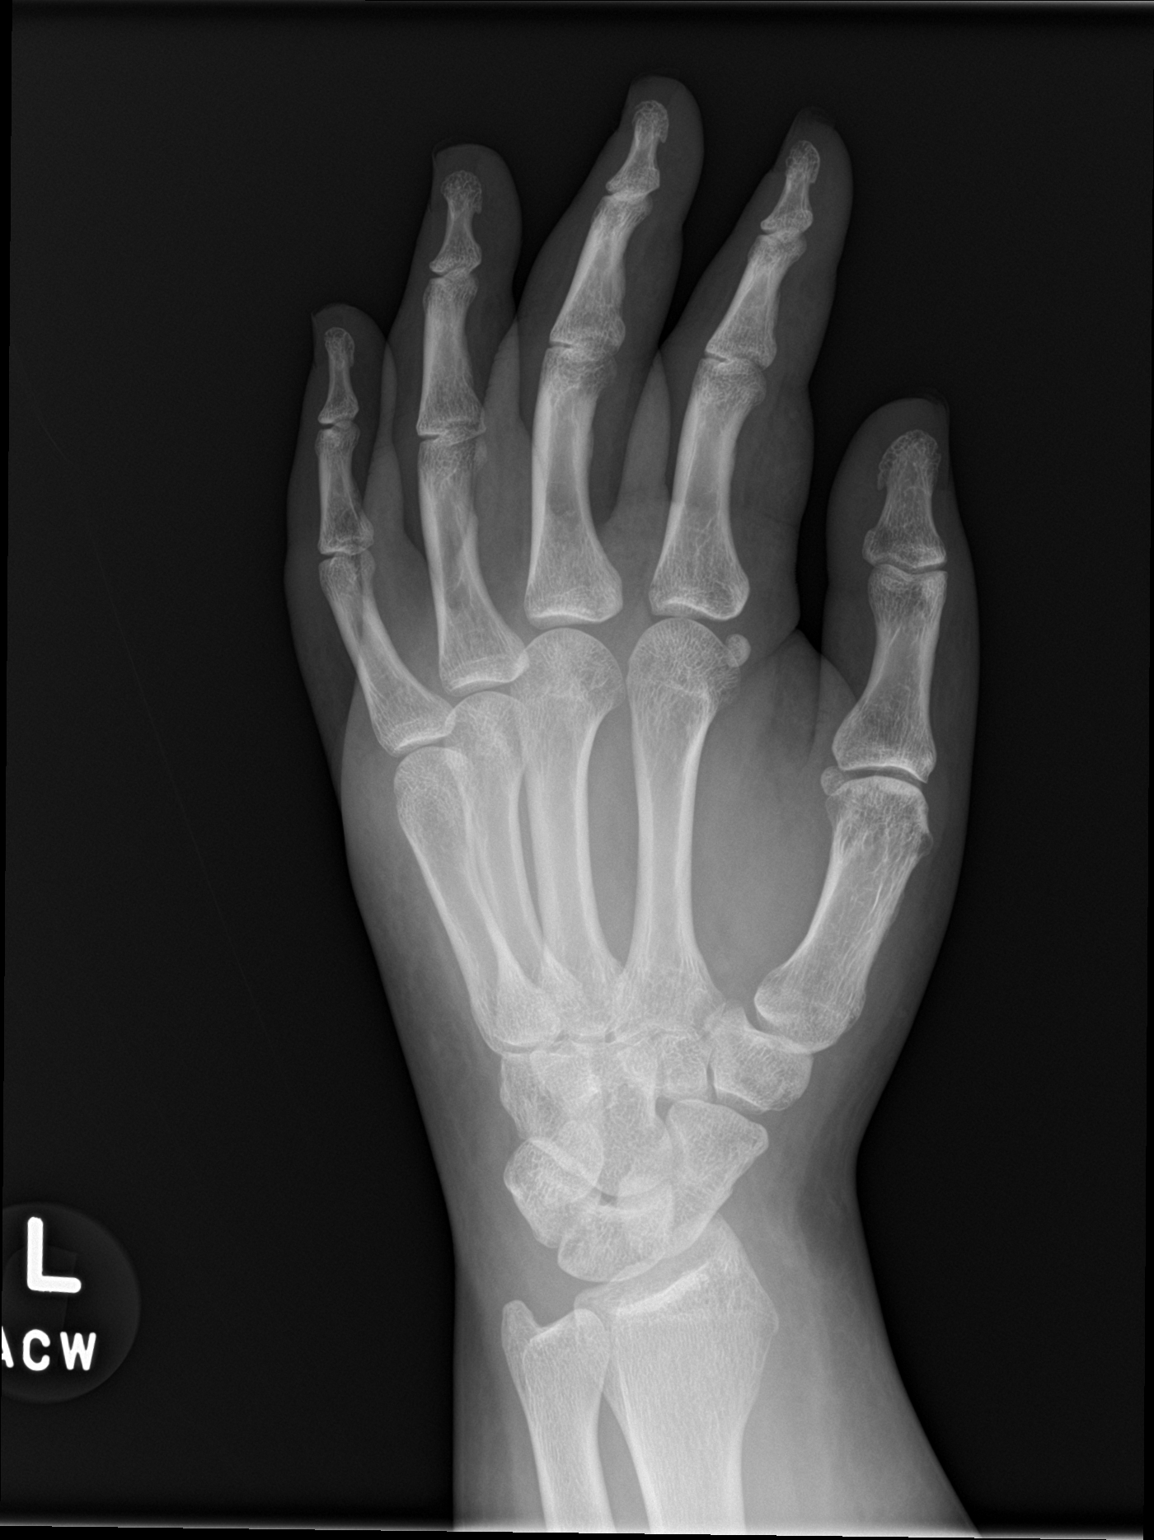
[im 3/3]
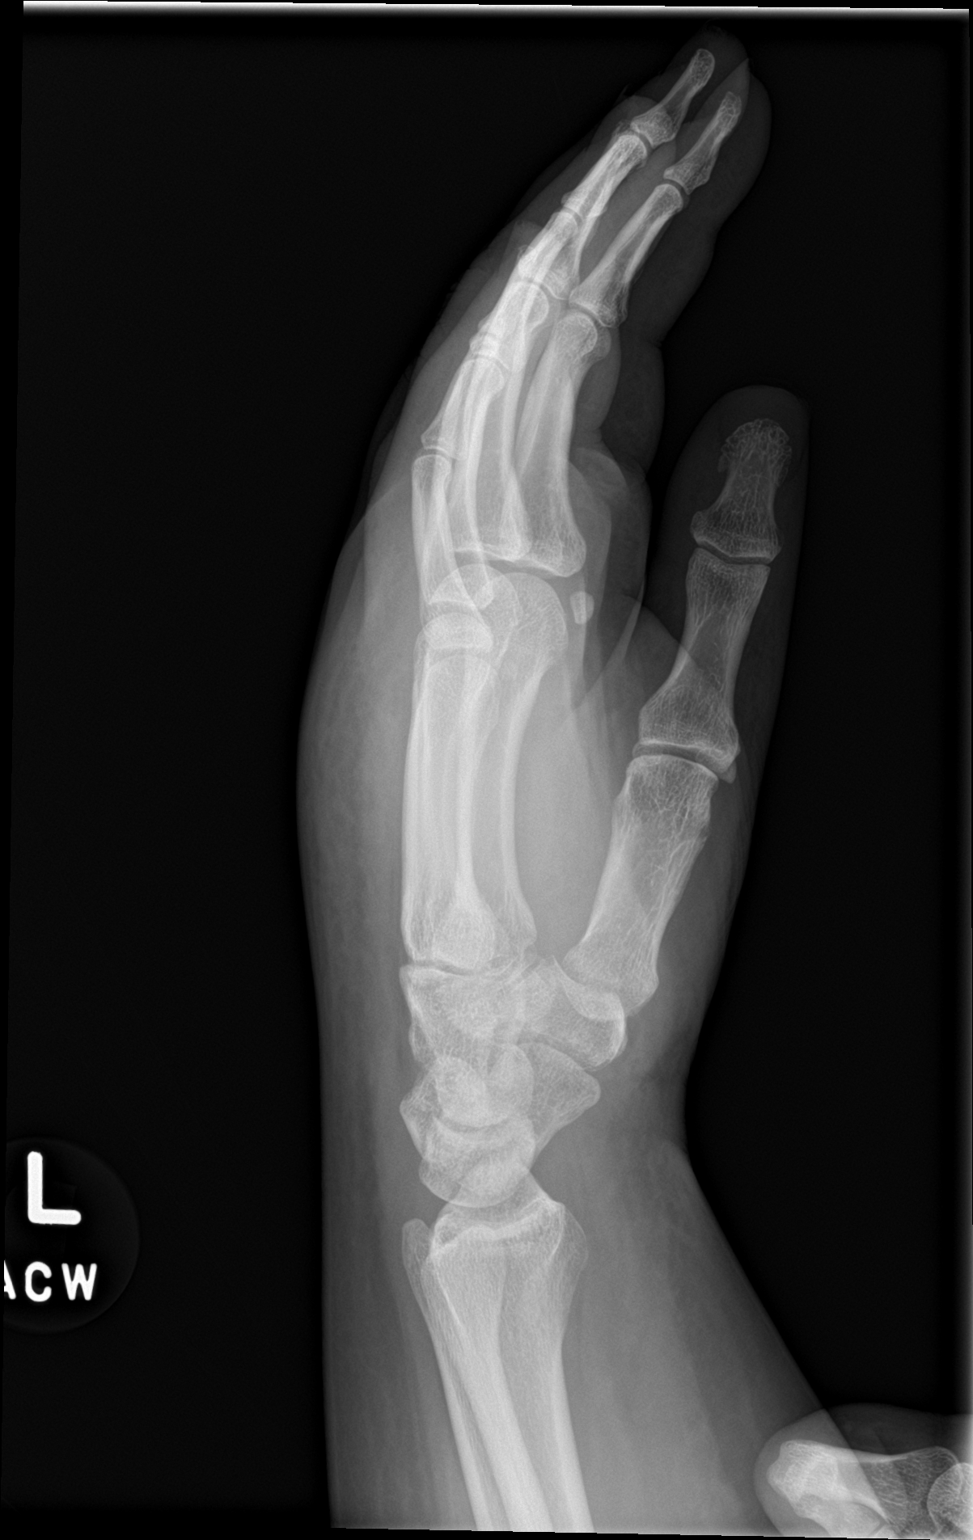

[3 of 3 positions shown; findings below may reference images not displayed]

FINDINGS: There is soft tissue swelling about the hand. There is no acute
displaced fracture or dislocation. There is no radiopaque foreign
body. There are no significant degenerative changes.
IMPRESSION: Soft tissue swelling about the hand without underlying acute osseous
abnormality.

## 2020-08-17 IMAGING — CR DG ELBOW COMPLETE 3+V*L*
1 series · 4 of 4 positions shown · non-contrast
Comparison: None.

CLINICAL DATA: Pain

EXAM:
LEFT ELBOW - COMPLETE 3+ VIEW

[Series 1: dg elbow complete left (3+view) · 0.14mm/px · 4 of 4 slices shown]
[im 1/4]
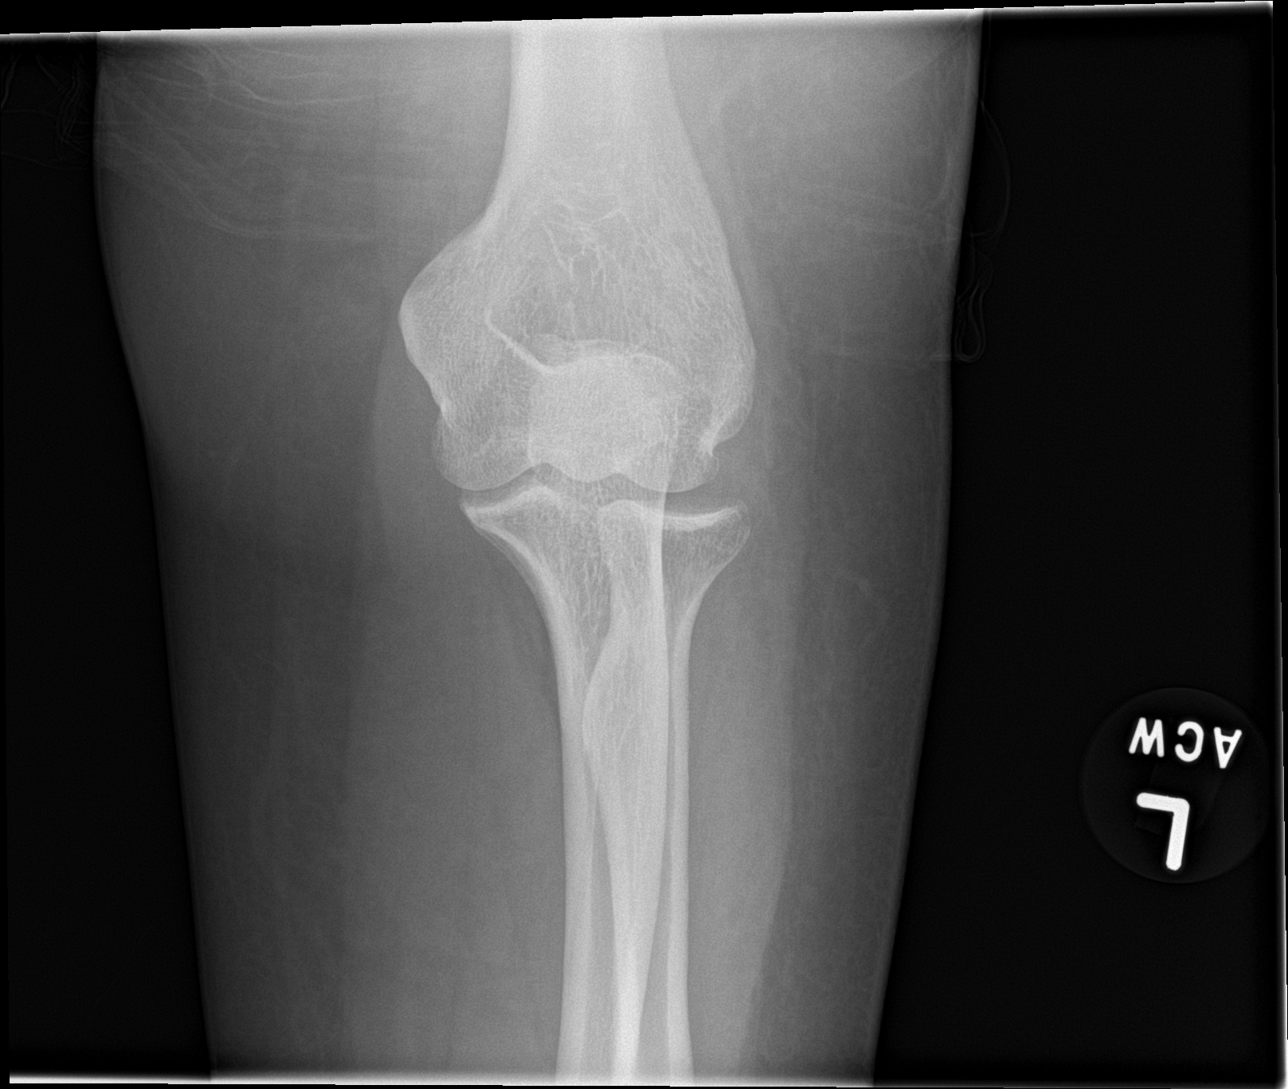
[im 2/4]
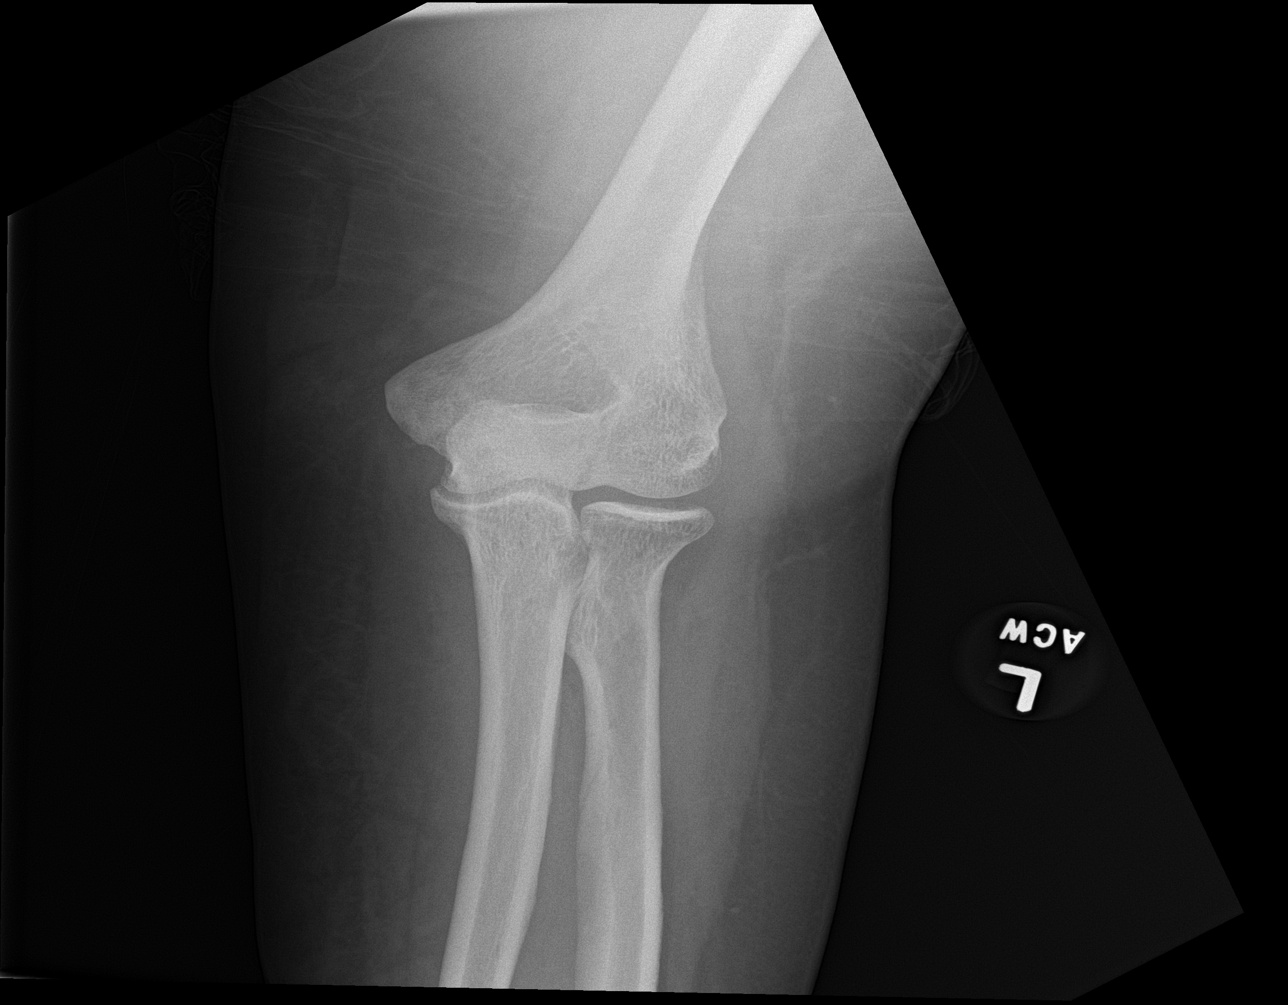
[im 3/4]
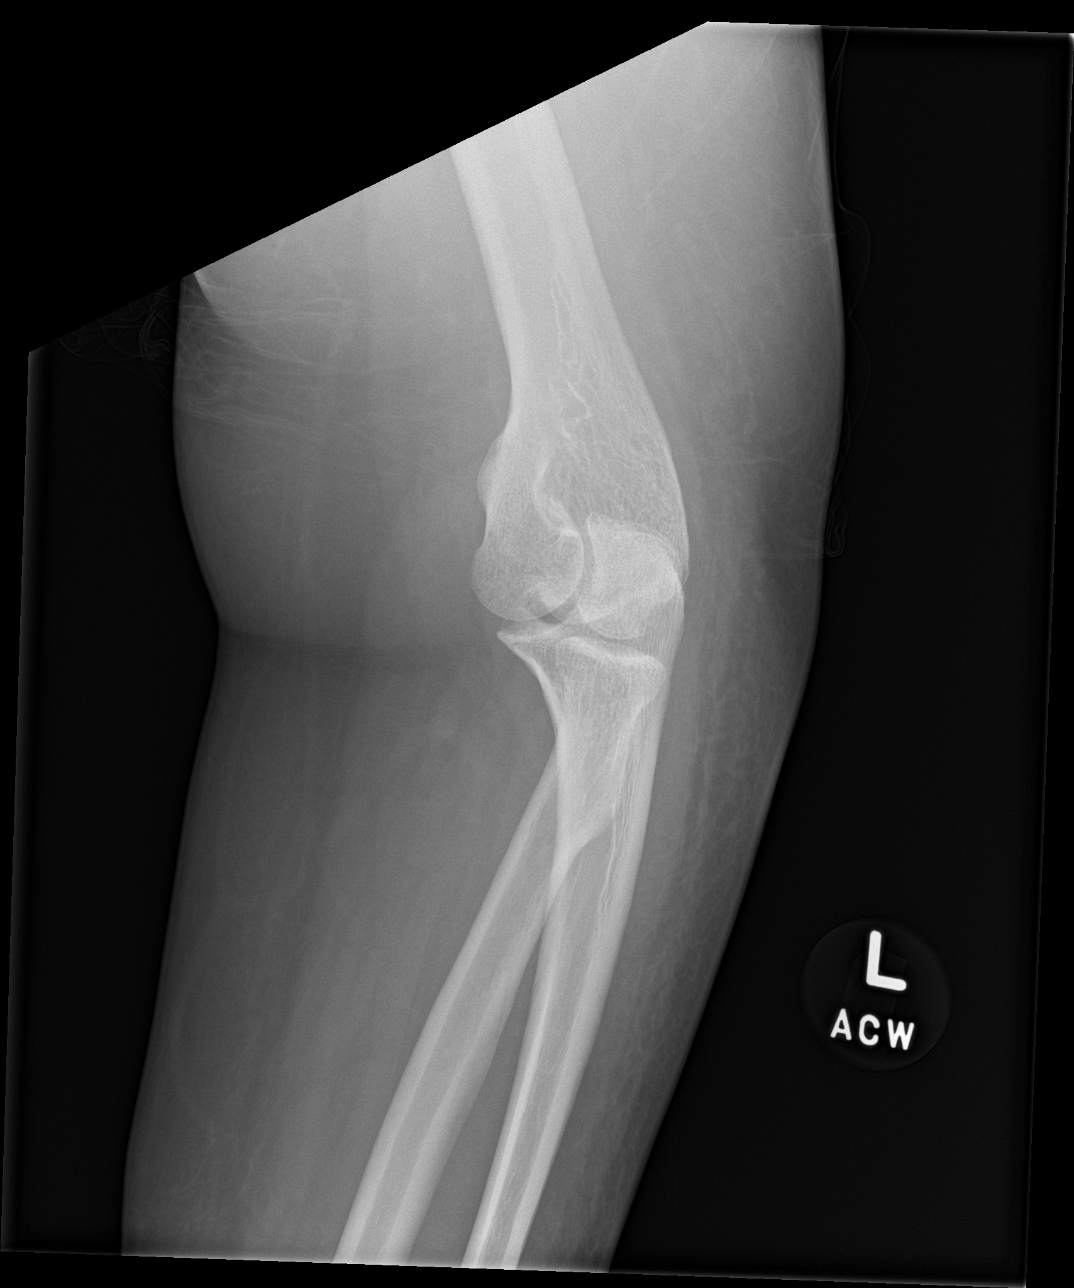
[im 4/4]
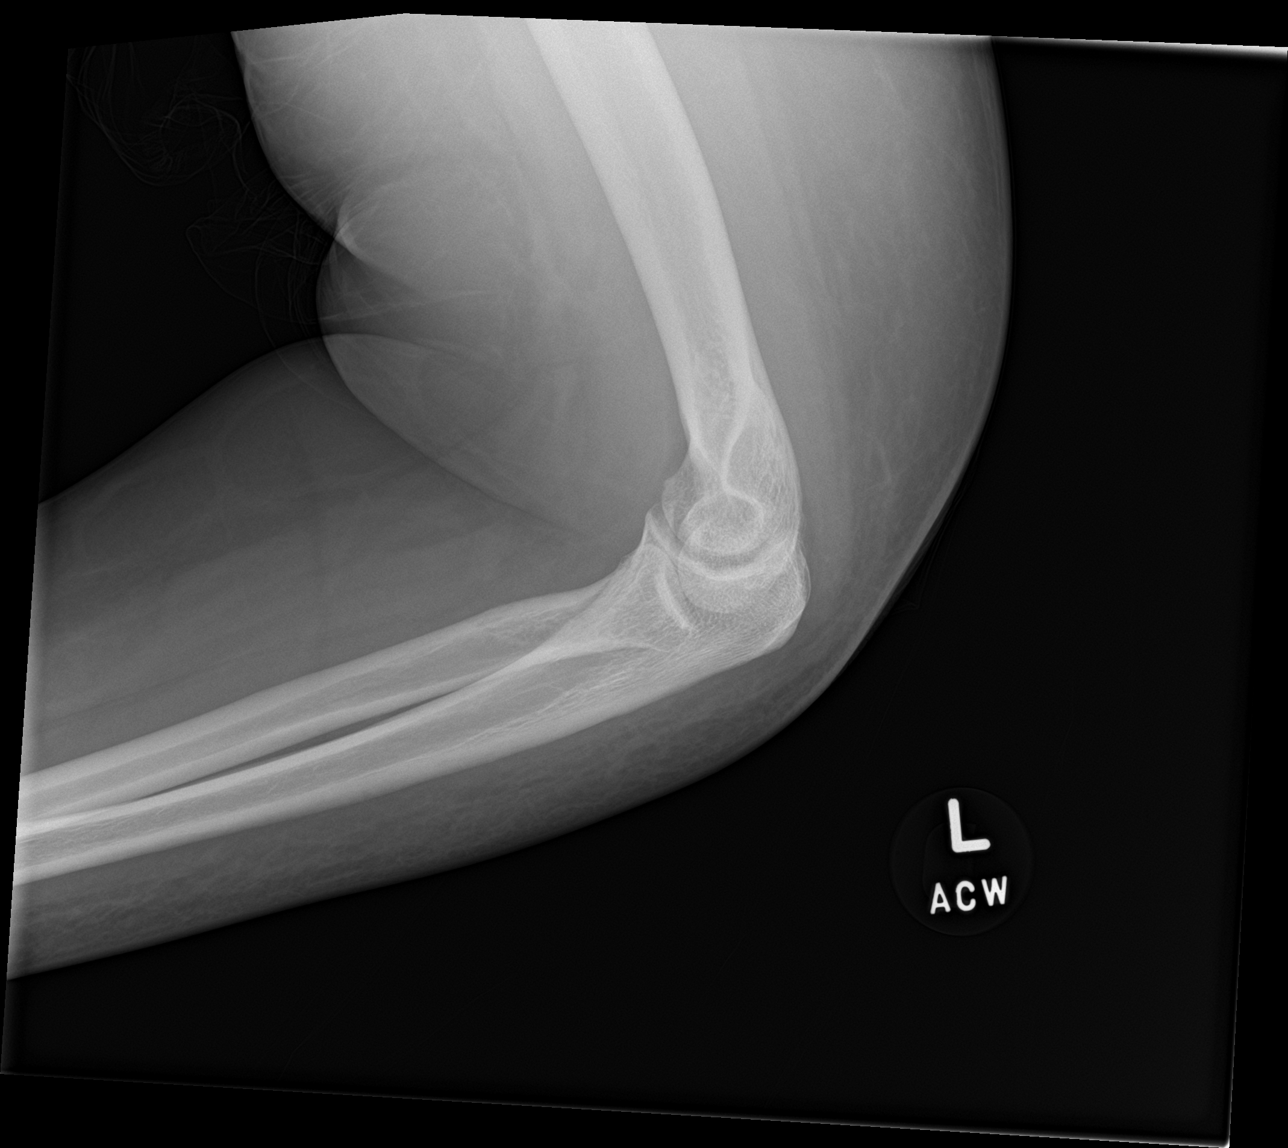

[4 of 4 positions shown; findings below may reference images not displayed]

FINDINGS: There is soft tissue swelling without evidence for an acute
displaced fracture or dislocation. There is no radiopaque foreign
body. There is no significant joint effusion.
IMPRESSION: Soft tissue swelling without evidence for an acute displaced
fracture or dislocation.

## 2020-08-17 MED ORDER — FLUTICASONE-SALMETEROL 250-50 MCG/DOSE IN AEPB: 1.0000 | INHALATION_SPRAY | Freq: Two times a day (BID) | RESPIRATORY_TRACT | 1 refills | Status: DC

## 2020-08-17 MED ORDER — DEBROX 6.5 % OT SOLN
5.0000 [drp] | Freq: Every day | OTIC | 0 refills | Status: AC | PRN
Start: 2020-08-17 — End: 2020-08-25

## 2020-08-17 NOTE — Progress Notes (Signed)
New patient visit   Patient: Collin Brown   DOB: 07-02-1986   35 y.o. Male  MRN: [id number] Visit Date: 08/17/2020  Today's healthcare provider: Jairo Ben, FNP   Chief Complaint  Patient presents with  . New Patient (Initial Visit)   Subjective    Collin Brown is a 35 y.o. male who presents today as a new patient to establish care.  HPI  Patient comes in office today with concerns of possible nerve damage to left arm. Patient reports pain and swelling of joints and reports that he uis currently going through OT and PT, patient states that he is taking OxyContin and Percocet for pain management but reports no relief.    Seeing Dr. Leanord Hawking for wound vac on his sacral wound, due to Covid he got a bed sore. 05/03/2020 to December 23 rd 2021.   Left arm is in sling, he noticed after being hospitalized in ICU for Covid 2021. He has some nerve pain lower arm.  Arm stays swollen from elbow down since ICU.  He has residual dyspnea and long haulers from covid.     Past Medical History:  Diagnosis Date  . Acute blood loss anemia   . Critical illness myopathy   . Diabetes mellitus without complication (HCC)   . Hearing loss in right ear   . Hypertension   . Sleep apnea    Past Surgical History:  Procedure Laterality Date  . TONSILLECTOMY    . WOUND DEBRIDEMENT N/A 06/29/2020   Procedure: DEBRIDEMENT SACRAL WOUND;  Surgeon: Axel Filler, MD;  Location: [facility] OR;  Service: General;  Laterality: N/A;   Family Status  Relation Name Status  . Mother  Alive  . Father  Alive   Family History  Problem Relation Age of Onset  . Diabetes Mellitus II Mother   . Hypertension Mother   . Diabetes Mother   . Diabetes Mellitus II Father   . High blood pressure Father   . Heart attack Father    Social History   Socioeconomic History  . Marital status: Single    Spouse name: Not on file  . Number of children: Not on file  . Years of education: Not on file  .  Highest education level: Not on file  Occupational History  . Occupation: Risk manager  Tobacco Use  . Smoking status: Never Smoker  . Smokeless tobacco: Never Used  Vaping Use  . Vaping Use: Never used  Substance and Sexual Activity  . Alcohol use: No  . Drug use: No  . Sexual activity: Yes    Birth control/protection: None  Other Topics Concern  . Not on file  Social History Narrative  . Not on file   Social Determinants of Health   Financial Resource Strain: Not on file  Food Insecurity: Not on file  Transportation Needs: Not on file  Physical Activity: Not on file  Stress: Not on file  Social Connections: Not on file   Outpatient Medications Prior to Visit  Medication Sig  . acetaminophen (TYLENOL) 325 MG tablet Take 1.5-2 tablets (487.5-650 mg total) by mouth every 6 (six) hours as needed for mild pain, fever or headache.  Marland Kitchen ascorbic acid (VITAMIN C) 500 MG tablet Take 1 tablet (500 mg total) by mouth daily.  Marland Kitchen aspirin 81 MG chewable tablet Chew 1 tablet (81 mg total) by mouth daily.  . collagenase (SANTYL) ointment Apply topically daily. Apply to sacrum ulcer, then cover with damp to  dry dressing.  . gabapentin (NEURONTIN) 400 MG capsule Take 1 capsule (400 mg total) by mouth 3 (three) times daily.  . insulin lispro (HUMALOG KWIKPEN) 100 UNIT/ML KwikPen Inject 4 Units into the skin 3 (three) times daily. Take with meals (as long as blood sugars are over 80)  . Insulin Pen Needle (CAREFINE PEN NEEDLES) 32G X 6 MM MISC 1 applicator by Does not apply route 4 (four) times daily -  with meals and at bedtime.  Marland Kitchen LANTUS SOLOSTAR 100 UNIT/ML Solostar Pen Inject into the skin.  . Metoprolol Tartrate 75 MG TABS Take 75 mg by mouth 2 (two) times daily.  . Multiple Vitamin (MULTIVITAMIN WITH MINERALS) TABS tablet Take 1 tablet by mouth daily.  . nutrition supplement, JUVEN, (JUVEN) PACK Take 1 packet by mouth 2 (two) times daily between meals.  . ondansetron (ZOFRAN) 4 MG tablet  Take 1 tablet (4 mg total) by mouth every 8 (eight) hours as needed for nausea or vomiting.  Marland Kitchen oxyCODONE (OXY IR/ROXICODONE) 5 MG immediate release tablet Take 1 tablet (5 mg total) by mouth every 6 (six) hours as needed for breakthrough pain.  . potassium chloride SA (KLOR-CON) 20 MEQ tablet Take 2 tablets (40 mEq total) by mouth 2 (two) times daily.  Marland Kitchen senna-docusate (SENOKOT-S) 8.6-50 MG tablet Take 2 tablets by mouth 2 (two) times daily.  . [DISCONTINUED] insulin glargine (LANTUS) 100 UNIT/ML injection Inject 0.15 mLs (15 Units total) into the skin 2 (two) times daily.  . [DISCONTINUED] oxyCODONE (OXYCONTIN) 20 mg 12 hr tablet Take 1 tablet (20 mg total) by mouth every 12 (twelve) hours.   No facility-administered medications prior to visit.   No Known Allergies   There is no immunization history on file for this patient.  Health Maintenance  Topic Date Due  . Hepatitis C Screening  Never done  . PNEUMOCOCCAL POLYSACCHARIDE VACCINE AGE 23-64 HIGH RISK  Never done  . COVID-19 Vaccine (1) Never done  . FOOT EXAM  Never done  . OPHTHALMOLOGY EXAM  Never done  . URINE MICROALBUMIN  Never done  . TETANUS/TDAP  Never done  . INFLUENZA VACCINE  Never done  . HEMOGLOBIN A1C  10/31/2020  . HIV Screening  Completed    Patient Care Team: , Eula Fried, FNP as PCP - General (Family Medicine)  Review of Systems  Constitutional: Positive for fatigue.  HENT: Negative.   Eyes: Negative for photophobia, redness and visual disturbance.  Respiratory: Positive for cough, shortness of breath and wheezing. Negative for apnea, choking, chest tightness and stridor.   Cardiovascular: Negative.   Gastrointestinal: Negative.   Genitourinary: Negative.   Musculoskeletal: Positive for arthralgias.  Skin: Positive for color change and wound.  Neurological: Negative for dizziness, seizures, syncope, facial asymmetry, speech difficulty, light-headedness, numbness and headaches.   Psychiatric/Behavioral: Negative.   All other systems reviewed and are negative.     Objective    BP (!) 144/106   Pulse (!) 126   Temp 97.8 F (36.6 C) (Oral)   Wt (!) 365 lb (165.6 kg)   SpO2 93%   BMI 52.37 kg/m  Physical Exam Vitals and nursing note reviewed.  Constitutional:      General: He is active. He is not in acute distress.    Appearance: Normal appearance. He is well-developed and well-nourished. He is obese. He is not ill-appearing, toxic-appearing, sickly-appearing or diaphoretic.     Comments: Patient is alert and oriented and responsive to questions Engages in eye contact with provider.  Speaks in full sentences without any pauses without any shortness of breath or distress.    HENT:     Head: Normocephalic and atraumatic.     Right Ear: Hearing, tympanic membrane, ear canal and external ear normal. There is no impacted cerumen.     Left Ear: Hearing, tympanic membrane, ear canal and external ear normal. There is no impacted cerumen.     Nose: Nose normal. No congestion or rhinorrhea.     Mouth/Throat:     Mouth: Oropharynx is clear and moist and mucous membranes are normal. Mucous membranes are moist.     Pharynx: Uvula midline. No oropharyngeal exudate.  Eyes:     General: Lids are normal. No scleral icterus.       Right eye: No discharge.        Left eye: No discharge.     Extraocular Movements: EOM normal.     Conjunctiva/sclera: Conjunctivae normal.     Pupils: Pupils are equal, round, and reactive to light.  Neck:     Thyroid: No thyromegaly.     Vascular: Normal carotid pulses. No carotid bruit, hepatojugular reflux or JVD.     Trachea: Trachea and phonation normal. No tracheal tenderness or tracheal deviation.     Meningeal: Brudzinski's sign absent.  Cardiovascular:     Rate and Rhythm: Normal rate and regular rhythm.     Pulses: Normal pulses and intact distal pulses.     Heart sounds: Normal heart sounds, S1 normal and S2 normal. Heart sounds  not distant. No murmur heard. No friction rub. No gallop.   Pulmonary:     Effort: Pulmonary effort is normal. No accessory muscle usage or respiratory distress.     Breath sounds: Normal breath sounds. No stridor. No wheezing, rhonchi or rales.  Chest:     Chest wall: No tenderness.  Abdominal:     General: Bowel sounds are normal. There is no distension. Aorta is normal.     Palpations: Abdomen is soft. There is no mass.     Tenderness: There is no abdominal tenderness. There is no guarding or rebound.     Hernia: No hernia is present.  Musculoskeletal:        General: Swelling (left upper extremity ) and tenderness present. No deformity or edema. Normal range of motion.     Cervical back: Full passive range of motion without pain, normal range of motion and neck supple. No rigidity or tenderness.     Comments: Patient moves on and off of exam table and in room without difficulty. Gait is normal in hall and in room. Patient is oriented to person place time and situation. Patient answers questions appropriately and engages in conversation.   Lymphadenopathy:     Head:     Right side of head: No submental, submandibular, tonsillar, preauricular, posterior auricular or occipital adenopathy.     Left side of head: No submental, submandibular, tonsillar, preauricular, posterior auricular or occipital adenopathy.     Cervical: No cervical adenopathy.     Upper Body:  No axillary adenopathy present. Skin:    General: Skin is warm, dry and intact.     Capillary Refill: Capillary refill takes less than 2 seconds.     Coloration: Skin is not pale.     Findings: No erythema or rash.     Nails: There is no clubbing or cyanosis.     Comments: Sacral wound covered denied any change.   Neurological:     General:  No focal deficit present.     Mental Status: He is alert and oriented to person, place, and time.     GCS: GCS eye subscore is 4. GCS verbal subscore is 5. GCS motor subscore is 6.      Cranial Nerves: No cranial nerve deficit.     Sensory: No sensory deficit.     Motor: No weakness or abnormal muscle tone.     Coordination: He displays a negative Romberg sign. Coordination normal.     Gait: Gait normal.     Deep Tendon Reflexes: Strength normal and reflexes are normal and symmetric. Reflexes normal.  Psychiatric:        Mood and Affect: Mood and affect normal.        Speech: Speech normal.        Behavior: Behavior normal.        Thought Content: Thought content normal.        Cognition and Memory: Cognition and memory normal.        Judgment: Judgment normal.     Depression Screen PHQ 2/9 Scores 08/17/2020 07/19/2020  PHQ - 2 Score 2 2  PHQ- 9 Score 7 9   No results found for any visits on 08/17/20.  Assessment & Plan     Loud snoring - Plan: Ambulatory referral to Sleep Studies  Diabetes mellitus, new onset (HCC) - Plan: CBC with Differential/Platelet, Comprehensive metabolic panel, Lipid panel, TSH, POCT urinalysis dipstick, HgB A1c, Ambulatory referral to Endocrinology  Personal history of COVID-19 - Plan: Ambulatory referral to Pulmonology, Fluticasone-Salmeterol (ADVAIR DISKUS) 250-50 MCG/DOSE AEPB, DG Chest 2 View, Ambulatory referral to Sleep Studies  COVID-19 long hauler - Plan: Ambulatory referral to Pulmonology, US Venous Img Upper Uni Left (DVT), DG Chest 2 View, Ambulatory referral to Sleep Studies  Dyspnea, unspecified type - Plan: ECHOCARDIOGRAM COMPLETE, DG Chest 2 View  Left arm swelling - Plan: DG Hand Complete Left, DG Forearm Left, DG Humerus Left, US Venous Img Upper Uni Left (DVT)  Left elbow pain - Plan: DG Elbow Complete Left   Meds ordered this encounter  Medications  . Fluticasone-Salmeterol (ADVAIR DISKUS) 250-50 MCG/DOSE AEPB    Sig: Inhale 1 puff into the lungs 2 (two) times daily.    Dispense:  1 each    Refill:  1  . carbamide peroxide (DEBROX) 6.5 % OTIC solution    Sig: Place 5 drops into both ears daily as needed for up  to 8 days.    Dispense:  2 mL    Refill:  0   Orders Placed This Encounter  Procedures  . DG Hand Complete Left    Order Specific Question:   Reason for Exam (SYMPTOM  OR DIAGNOSIS REQUIRED)    Answer:   left hand pain.    Order Specific Question:   Preferred imaging location?    Answer:   [facility]  . DG Forearm Left    Order Specific Question:   Reason for Exam (SYMPTOM  OR DIAGNOSIS REQUIRED)    Answer:   left lower arm pain.    Order Specific Question:   Preferred imaging location?    Answer:   [facility]  . DG Humerus Left    Order Specific Question:   Reason for Exam (SYMPTOM  OR DIAGNOSIS REQUIRED)    Answer:   left arm pain    Order Specific Question:   Preferred imaging location?    Answer:   [facility]  . US Venous Img Upper  Uni Left (DVT)    Order Specific Question:   Reason for Exam (SYMPTOM  OR DIAGNOSIS REQUIRED)    Answer:   rule out any DVT or vasculature. history of covid.    Order Specific Question:   Preferred imaging location?    Answer:   [facility]    Order Specific Question:   Call Results- Best Contact Number?    Answer:   [phone]- hold and call   . DG Chest 2 View    Order Specific Question:   Reason for Exam (SYMPTOM  OR DIAGNOSIS REQUIRED)    Answer:   liong haulers covid, dyspnea    Order Specific Question:   Preferred imaging location?    Answer:   [facility]  . DG Elbow Complete Left    Order Specific Question:   Reason for Exam (SYMPTOM  OR DIAGNOSIS REQUIRED)    Answer:   left elbow pain    Order Specific Question:   Preferred imaging location?    Answer:   [facility]  . CBC with Differential/Platelet  . Comprehensive metabolic panel  . Lipid panel  . TSH  . HgB A1c  . Ambulatory referral to Endocrinology    Referral Priority:   Routine    Referral Type:   Consultation    Referral Reason:   Specialty Services Required    Referred to Provider:   Raj Janus, MD    Number of Visits Requested:    1  . Ambulatory referral to Pulmonology    Referral Priority:   Routine    Referral Type:   Consultation    Referral Reason:   Specialty Services Required    Referred to Provider:   Salena Saner, MD    Requested Specialty:   Pulmonary Disease    Number of Visits Requested:   1  . Ambulatory referral to Sleep Studies    Referral Priority:   Urgent    Referral Type:   Consultation    Referral Reason:   Specialty Services Required    Number of Visits Requested:   1  . POCT urinalysis dipstick  . ECHOCARDIOGRAM COMPLETE    Order Specific Question:   Where should this test be performed    Answer:   [facility]    Order Specific Question:   Perflutren DEFINITY (image enhancing agent) should be administered unless hypersensitivity or allergy exist    Answer:   Administer Perflutren    Order Specific Question:   Is a special reader required? (athlete or structural heart)    Answer:   Yes    Order Specific Question:   Reason for exam-Echo    Answer:   Dyspnea  R06.00    Order Specific Question:   Release to patient    Answer:   Immediate    Order Specific Question:   Other Comments    Answer:   long haulers covid. see hospitilization/    Return in about 3 weeks (around 09/07/2020), or if symptoms worsen or fail to improve, for at any time for any worsening symptoms, Go to Emergency room/ urgent care if worse.       The entirety of the information documented in the History of Present Illness, Review of Systems and Physical Exam were personally obtained by me. Portions of this information were initially documented by the CMA and reviewed by me for thoroughness and accuracy.     Jairo Ben, FNP  [facility] [phone] (phone) [phone] (fax)  [place]

## 2020-08-17 NOTE — Patient Instructions (Addendum)
Please allow 48 to 72 hours for all paperwork compeltion per office policy.    Health Maintenance, Male Adopting a healthy lifestyle and getting preventive care are important in promoting health and wellness. Ask your health care provider about:  The right schedule for you to have regular tests and exams.  Things you can do on your own to prevent diseases and keep yourself healthy. What should I know about diet, weight, and exercise? Eat a healthy diet   Eat a diet that includes plenty of vegetables, fruits, low-fat dairy products, and lean protein.  Do not eat a lot of foods that are high in solid fats, added sugars, or sodium. Maintain a healthy weight Body mass index (BMI) is a measurement that can be used to identify possible weight problems. It estimates body fat based on height and weight. Your health care provider can help determine your BMI and help you achieve or maintain a healthy weight. Get regular exercise Get regular exercise. This is one of the most important things you can do for your health. Most adults should:  Exercise for at least 150 minutes each week. The exercise should increase your heart rate and make you sweat (moderate-intensity exercise).  Do strengthening exercises at least twice a week. This is in addition to the moderate-intensity exercise.  Spend less time sitting. Even light physical activity can be beneficial. Watch cholesterol and blood lipids Have your blood tested for lipids and cholesterol at 34 years of age, then have this test every 5 years. You may need to have your cholesterol levels checked more often if:  Your lipid or cholesterol levels are high.  You are older than 34 years of age.  You are at high risk for heart disease. What should I know about cancer screening? Many types of cancers can be detected early and may often be prevented. Depending on your health history and family history, you may need to have cancer screening at various  ages. This may include screening for:  Colorectal cancer.  Prostate cancer.  Skin cancer.  Lung cancer. What should I know about heart disease, diabetes, and high blood pressure? Blood pressure and heart disease  High blood pressure causes heart disease and increases the risk of stroke. This is more likely to develop in people who have high blood pressure readings, are of African descent, or are overweight.  Talk with your health care provider about your target blood pressure readings.  Have your blood pressure checked: ? Every 3-5 years if you are 68-31 years of age. ? Every year if you are 59 years old or older.  If you are between the ages of 81 and 48 and are a current or former smoker, ask your health care provider if you should have a one-time screening for abdominal aortic aneurysm (AAA). Diabetes Have regular diabetes screenings. This checks your fasting blood sugar level. Have the screening done:  Once every three years after age 35 if you are at a normal weight and have a low risk for diabetes.  More often and at a younger age if you are overweight or have a high risk for diabetes. What should I know about preventing infection? Hepatitis B If you have a higher risk for hepatitis B, you should be screened for this virus. Talk with your health care provider to find out if you are at risk for hepatitis B infection. Hepatitis C Blood testing is recommended for:  Everyone born from [birth year] through 1964.  Anyone with known  risk factors for hepatitis C. Sexually transmitted infections (STIs)  You should be screened each year for STIs, including gonorrhea and chlamydia, if: ? You are sexually active and are younger than 34 years of age. ? You are older than 34 years of age and your health care provider tells you that you are at risk for this type of infection. ? Your sexual activity has changed since you were last screened, and you are at increased risk for chlamydia or  gonorrhea. Ask your health care provider if you are at risk.  Ask your health care provider about whether you are at high risk for HIV. Your health care provider may recommend a prescription medicine to help prevent HIV infection. If you choose to take medicine to prevent HIV, you should first get tested for HIV. You should then be tested every 3 months for as long as you are taking the medicine. Follow these instructions at home: Lifestyle  Do not use any products that contain nicotine or tobacco, such as cigarettes, e-cigarettes, and chewing tobacco. If you need help quitting, ask your health care provider.  Do not use street drugs.  Do not share needles.  Ask your health care provider for help if you need support or information about quitting drugs. Alcohol use  Do not drink alcohol if your health care provider tells you not to drink.  If you drink alcohol: ? Limit how much you have to 0-2 drinks a day. ? Be aware of how much alcohol is in your drink. In the [place] one drink equals one 12 oz bottle of beer (355 mL), one 5 oz glass of wine (148 mL), or one 1 oz glass of hard liquor (44 mL). General instructions  Schedule regular health, dental, and eye exams.  Stay current with your vaccines.  Tell your health care provider if: ? You often feel depressed. ? You have ever been abused or do not feel safe at home. Summary  Adopting a healthy lifestyle and getting preventive care are important in promoting health and wellness.  Follow your health care provider's instructions about healthy diet, exercising, and getting tested or screened for diseases.  Follow your health care provider's instructions on monitoring your cholesterol and blood pressure. This information is not intended to replace advice given to you by your health care provider. Make sure you discuss any questions you have with your health care provider. Document Revised: 07/21/2018 Document Reviewed:  07/21/2018 Elsevier Patient Education  2020 [employer] Inc[employer]    Carbamide Peroxide ear solution What is this medicine? CARBAMIDE PEROXIDE (CAR bah mide per OX ide) is used to soften and help remove ear wax. This medicine may be used for other purposes; ask your health care provider or pharmacist if you have questions. COMMON BRAND NAME(S): Auro Ear, Auro Earache Relief, Debrox, Ear Drops, Ear Wax Removal, Ear Wax Remover, Earwax Treatment, Murine, Thera-Ear What should I tell my health care provider before I take this medicine? They need to know if you have any of these conditions:  dizziness  ear discharge  ear pain, irritation or rash  infection  perforated eardrum (hole in eardrum)  an unusual or allergic reaction to carbamide peroxide, glycerin, hydrogen peroxide, other medicines, foods, dyes, or preservatives  pregnant or trying to get pregnant  breast-feeding How should I use this medicine? This medicine is only for use in the outer ear canal. Follow the directions carefully. Wash hands before and after use. The solution may be warmed by holding the  bottle in the hand for 1 to 2 minutes. Lie with the affected ear facing upward. Place the proper number of drops into the ear canal. After the drops are instilled, remain lying with the affected ear upward for 5 minutes to help the drops stay in the ear canal. A cotton ball may be gently inserted at the ear opening for no longer than 5 to 10 minutes to ensure retention. Repeat, if necessary, for the opposite ear. Do not touch the tip of the dropper to the ear, fingertips, or other surface. Do not rinse the dropper after use. Keep container tightly closed. Talk to your pediatrician regarding the use of this medicine in children. While this drug may be used in children as young as 12 years for selected conditions, precautions do apply. Overdosage: If you think you have taken too much of this medicine contact a poison control center or  emergency room at once. NOTE: This medicine is only for you. Do not share this medicine with others. What if I miss a dose? If you miss a dose, use it as soon as you can. If it is almost time for your next dose, use only that dose. Do not use double or extra doses. What may interact with this medicine? Interactions are not expected. Do not use any other ear products without asking your doctor or health care professional. This list may not describe all possible interactions. Give your health care provider a list of all the medicines, herbs, non-prescription drugs, or dietary supplements you use. Also tell them if you smoke, drink alcohol, or use illegal drugs. Some items may interact with your medicine. What should I watch for while using this medicine? This medicine is not for long-term use. Do not use for more than 4 days without checking with your health care professional. Contact your doctor or health care professional if your condition does not start to get better within a few days or if you notice burning, redness, itching or swelling. What side effects may I notice from receiving this medicine? Side effects that you should report to your doctor or health care professional as soon as possible:  allergic reactions like skin rash, itching or hives, swelling of the face, lips, or tongue  burning, itching, and redness  worsening ear pain  rash Side effects that usually do not require medical attention (report to your doctor or health care professional if they continue or are bothersome):  abnormal sensation while putting the drops in the ear  temporary reduction in hearing (but not complete loss of hearing) This list may not describe all possible side effects. Call your doctor for medical advice about side effects. You may report side effects to FDA at 1-800-FDA-1088. Where should I keep my medicine? Keep out of the reach of children. Store at room temperature between 15 and 30 degrees C  (59 and 86 degrees F) in a tight, light-resistant container. Keep bottle away from excessive heat and direct sunlight. Throw away any unused medicine after the expiration date. NOTE: This sheet is a summary. It may not cover all possible information. If you have questions about this medicine, talk to your doctor, pharmacist, or health care provider.  2020 Elsevier/Gold Standard (2007-11-09 14:00:02)  Health Maintenance, Male Adopting a healthy lifestyle and getting preventive care are important in promoting health and wellness. Ask your health care provider about:  The right schedule for you to have regular tests and exams.  Things you can do on your own to  prevent diseases and keep yourself healthy. What should I know about diet, weight, and exercise? Eat a healthy diet   Eat a diet that includes plenty of vegetables, fruits, low-fat dairy products, and lean protein.  Do not eat a lot of foods that are high in solid fats, added sugars, or sodium. Maintain a healthy weight Body mass index (BMI) is a measurement that can be used to identify possible weight problems. It estimates body fat based on height and weight. Your health care provider can help determine your BMI and help you achieve or maintain a healthy weight. Get regular exercise Get regular exercise. This is one of the most important things you can do for your health. Most adults should:  Exercise for at least 150 minutes each week. The exercise should increase your heart rate and make you sweat (moderate-intensity exercise).  Do strengthening exercises at least twice a week. This is in addition to the moderate-intensity exercise.  Spend less time sitting. Even light physical activity can be beneficial. Watch cholesterol and blood lipids Have your blood tested for lipids and cholesterol at 34 years of age, then have this test every 5 years. You may need to have your cholesterol levels checked more often if:  Your lipid or  cholesterol levels are high.  You are older than 34 years of age.  You are at high risk for heart disease. What should I know about cancer screening? Many types of cancers can be detected early and may often be prevented. Depending on your health history and family history, you may need to have cancer screening at various ages. This may include screening for:  Colorectal cancer.  Prostate cancer.  Skin cancer.  Lung cancer. What should I know about heart disease, diabetes, and high blood pressure? Blood pressure and heart disease  High blood pressure causes heart disease and increases the risk of stroke. This is more likely to develop in people who have high blood pressure readings, are of African descent, or are overweight.  Talk with your health care provider about your target blood pressure readings.  Have your blood pressure checked: ? Every 3-5 years if you are 31-46 years of age. ? Every year if you are 66 years old or older.  If you are between the ages of 68 and 27 and are a current or former smoker, ask your health care provider if you should have a one-time screening for abdominal aortic aneurysm (AAA). Diabetes Have regular diabetes screenings. This checks your fasting blood sugar level. Have the screening done:  Once every three years after age 52 if you are at a normal weight and have a low risk for diabetes.  More often and at a younger age if you are overweight or have a high risk for diabetes. What should I know about preventing infection? Hepatitis B If you have a higher risk for hepatitis B, you should be screened for this virus. Talk with your health care provider to find out if you are at risk for hepatitis B infection. Hepatitis C Blood testing is recommended for:  Everyone born from [birth year] through 1964.  Anyone with known risk factors for hepatitis C. Sexually transmitted infections (STIs)  You should be screened each year for STIs, including gonorrhea  and chlamydia, if: ? You are sexually active and are younger than 34 years of age. ? You are older than 34 years of age and your health care provider tells you that you are at risk for this type  of infection. ? Your sexual activity has changed since you were last screened, and you are at increased risk for chlamydia or gonorrhea. Ask your health care provider if you are at risk.  Ask your health care provider about whether you are at high risk for HIV. Your health care provider may recommend a prescription medicine to help prevent HIV infection. If you choose to take medicine to prevent HIV, you should first get tested for HIV. You should then be tested every 3 months for as long as you are taking the medicine. Follow these instructions at home: Lifestyle  Do not use any products that contain nicotine or tobacco, such as cigarettes, e-cigarettes, and chewing tobacco. If you need help quitting, ask your health care provider.  Do not use street drugs.  Do not share needles.  Ask your health care provider for help if you need support or information about quitting drugs. Alcohol use  Do not drink alcohol if your health care provider tells you not to drink.  If you drink alcohol: ? Limit how much you have to 0-2 drinks a day. ? Be aware of how much alcohol is in your drink. In the [place] one drink equals one 12 oz bottle of beer (355 mL), one 5 oz glass of wine (148 mL), or one 1 oz glass of hard liquor (44 mL). General instructions  Schedule regular health, dental, and eye exams.  Stay current with your vaccines.  Tell your health care provider if: ? You often feel depressed. ? You have ever been abused or do not feel safe at home. Summary  Adopting a healthy lifestyle and getting preventive care are important in promoting health and wellness.  Follow your health care provider's instructions about healthy diet, exercising, and getting tested or screened for diseases.  Follow your  health care provider's instructions on monitoring your cholesterol and blood pressure. This information is not intended to replace advice given to you by your health care provider. Make sure you discuss any questions you have with your health care provider. Document Revised: 07/21/2018 Document Reviewed: 07/21/2018 Elsevier Patient Education  [place]. Fluticasone; Salmeterol inhalation aerosol What is this medicine? FLUTICASONE; SALMETEROL (floo TIK a sone; sal ME te role) inhalation is a combination of two medicines that decrease inflammation and help to open up the airways of your lungs. It is used to treat asthma. Do NOT use for an acute asthma attack. This medicine may be used for other purposes; ask your health care provider or pharmacist if you have questions. COMMON BRAND NAME(S): Advair HFA What should I tell my health care provider before I take this medicine? They need to know if you have any of these conditions:  bone problems  diabetes  eye disease, vision problems  immune system problems  heart disease or irregular heartbeat  high blood pressure  infection  pheochromocytoma  seizures  thyroid disease  worsening asthma  an unusual or allergic reaction to fluticasone; salmeterol, other corticosteroids, other medicines, foods, dyes, or preservatives  pregnant or trying to get pregnant  breast-feeding How should I use this medicine? This medicine is inhaled through the mouth. Follow the directions on the prescription label. Shake well for 5 seconds before each use. After using the inhaler, rinse your mouth with water. Make sure not to swallow the water. Take your medicine at regular intervals. Do not take your medicine more often than directed. Do not stop taking except on your doctor's advice. Make sure that you  are using your inhaler correctly. Ask you doctor or health care provider if you have any questions. A special MedGuide will be given to you by  the pharmacist with each prescription and refill. Be sure to read this information carefully each time. Talk to your pediatrician regarding the use of this medicine in children. While this drug may be prescribed for children as young as 34 years of age for selected conditions, precautions do apply. Overdosage: If you think you have taken too much of this medicine contact a poison control center or emergency room at once. NOTE: This medicine is only for you. Do not share this medicine with others. What if I miss a dose? If you miss a dose, use it as soon as you remember. If it is almost time for your next dose, use only that dose and continue with your regular schedule, spacing doses evenly. Do not use double or extra doses. What may interact with this medicine? Do not take this medicine with any of the following medications:  MAOIs like Carbex, Eldepryl, Marplan, Nardil, and Parnate This medicine may also interact with the following medications:  aminophylline or theophylline  antiviral medicines for HIV or AIDS  beta-blockers like metoprolol and propranolol  certain antibiotics like clarithromycin, erythromycin, levofloxacin, linezolid, and telithromycin  certain medicines for fungal infections like ketoconazole, itraconazole, posaconazole, voriconazole  conivaptan  diuretics  medicines for colds  medicines for depression or emotional conditions  nefazodone  vaccines This list may not describe all possible interactions. Give your health care provider a list of all the medicines, herbs, non-prescription drugs, or dietary supplements you use. Also tell them if you smoke, drink alcohol, or use illegal drugs. Some items may interact with your medicine. What should I watch for while using this medicine? Visit your doctor for regular check ups. Tell your doctor or health care professional if your symptoms do not get better. Do not use this medicine more than every 12 hours. NEVER use  this medicine for an acute asthma attack. You should use your short-acting rescue inhalers for this purpose. If your symptoms get worse or if you need your short-acting inhalers more often, call your doctor right away. This medicine may increase your risk of getting an infection. Tell your doctor or health care professional if you are around anyone with measles or chickenpox, or if you develop sores or blisters that do not heal properly. This medicine may increase blood sugar. Ask your healthcare provider if changes in diet or medicines are needed if you have diabetes. What side effects may I notice from receiving this medicine? Side effects that you should report to your doctor or health care professional as soon as possible:  allergic reactions like skin rash or hives, swelling of the face, lips, or tongue  breathing problems right after inhaling your medicine  chest pain  fast, irregular heartbeat  feeling faint or lightheaded, falls  fever or chills  nausea, vomiting  signs and symptoms of high blood sugar such as being more thirsty or hungry or having to urinate more than normal. You may also feel very tired or have blurry vision. Side effects that usually do not require medical attention (report to your doctor or health care professional if they continue or are bothersome):  cough  headache  nervousness  sore throat  stuffy nose  tremor This list may not describe all possible side effects. Call your doctor for medical advice about side effects. You may report side effects to FDA  at 1-800-FDA-1088. Where should I keep my medicine? Keep out of the reach of children. Store at room temperature between 15 and 30 degrees C (59 and 86 degrees F). Store inhaler with the mouthpiece down. Throw away the inhaler when the dose indicator reads 000, or after the expiration date, whichever comes first. NOTE: This sheet is a summary. It may not cover all possible information. If you have  questions about this medicine, talk to your doctor, pharmacist, or health care provider.  2020 Elsevier/Gold Standard (2018-04-28 11:32:44)  Health Maintenance, Male Adopting a healthy lifestyle and getting preventive care are important in promoting health and wellness. Ask your health care provider about:  The right schedule for you to have regular tests and exams.  Things you can do on your own to prevent diseases and keep yourself healthy. What should I know about diet, weight, and exercise? Eat a healthy diet   Eat a diet that includes plenty of vegetables, fruits, low-fat dairy products, and lean protein.  Do not eat a lot of foods that are high in solid fats, added sugars, or sodium. Maintain a healthy weight Body mass index (BMI) is a measurement that can be used to identify possible weight problems. It estimates body fat based on height and weight. Your health care provider can help determine your BMI and help you achieve or maintain a healthy weight. Get regular exercise Get regular exercise. This is one of the most important things you can do for your health. Most adults should:  Exercise for at least 150 minutes each week. The exercise should increase your heart rate and make you sweat (moderate-intensity exercise).  Do strengthening exercises at least twice a week. This is in addition to the moderate-intensity exercise.  Spend less time sitting. Even light physical activity can be beneficial. Watch cholesterol and blood lipids Have your blood tested for lipids and cholesterol at 34 years of age, then have this test every 5 years. You may need to have your cholesterol levels checked more often if:  Your lipid or cholesterol levels are high.  You are older than 34 years of age.  You are at high risk for heart disease. What should I know about cancer screening? Many types of cancers can be detected early and may often be prevented. Depending on your health history and  family history, you may need to have cancer screening at various ages. This may include screening for:  Colorectal cancer.  Prostate cancer.  Skin cancer.  Lung cancer. What should I know about heart disease, diabetes, and high blood pressure? Blood pressure and heart disease  High blood pressure causes heart disease and increases the risk of stroke. This is more likely to develop in people who have high blood pressure readings, are of African descent, or are overweight.  Talk with your health care provider about your target blood pressure readings.  Have your blood pressure checked: ? Every 3-5 years if you are 3618-34 years of age. ? Every year if you are 34 years old or older.  If you are between the ages of 7365 and 4075 and are a current or former smoker, ask your health care provider if you should have a one-time screening for abdominal aortic aneurysm (AAA). Diabetes Have regular diabetes screenings. This checks your fasting blood sugar level. Have the screening done:  Once every three years after age 34 if you are at a normal weight and have a low risk for diabetes.  More often and at  a younger age if you are overweight or have a high risk for diabetes. What should I know about preventing infection? Hepatitis B If you have a higher risk for hepatitis B, you should be screened for this virus. Talk with your health care provider to find out if you are at risk for hepatitis B infection. Hepatitis C Blood testing is recommended for:  Everyone born from [birth year] through 1964.  Anyone with known risk factors for hepatitis C. Sexually transmitted infections (STIs)  You should be screened each year for STIs, including gonorrhea and chlamydia, if: ? You are sexually active and are younger than 34 years of age. ? You are older than 34 years of age and your health care provider tells you that you are at risk for this type of infection. ? Your sexual activity has changed since you were  last screened, and you are at increased risk for chlamydia or gonorrhea. Ask your health care provider if you are at risk.  Ask your health care provider about whether you are at high risk for HIV. Your health care provider may recommend a prescription medicine to help prevent HIV infection. If you choose to take medicine to prevent HIV, you should first get tested for HIV. You should then be tested every 3 months for as long as you are taking the medicine. Follow these instructions at home: Lifestyle  Do not use any products that contain nicotine or tobacco, such as cigarettes, e-cigarettes, and chewing tobacco. If you need help quitting, ask your health care provider.  Do not use street drugs.  Do not share needles.  Ask your health care provider for help if you need support or information about quitting drugs. Alcohol use  Do not drink alcohol if your health care provider tells you not to drink.  If you drink alcohol: ? Limit how much you have to 0-2 drinks a day. ? Be aware of how much alcohol is in your drink. In the [place] one drink equals one 12 oz bottle of beer (355 mL), one 5 oz glass of wine (148 mL), or one 1 oz glass of hard liquor (44 mL). General instructions  Schedule regular health, dental, and eye exams.  Stay current with your vaccines.  Tell your health care provider if: ? You often feel depressed. ? You have ever been abused or do not feel safe at home. Summary  Adopting a healthy lifestyle and getting preventive care are important in promoting health and wellness.  Follow your health care provider's instructions about healthy diet, exercising, and getting tested or screened for diseases.  Follow your health care provider's instructions on monitoring your cholesterol and blood pressure. This information is not intended to replace advice given to you by your health care provider. Make sure you discuss any questions you have with your health care  provider. Document Revised: 07/21/2018 Document Reviewed: 07/21/2018 Elsevier Patient Education  2020 [employer].

## 2020-08-20 ENCOUNTER — Telehealth: Payer: Self-pay | Admitting: *Deleted

## 2020-08-20 MED ORDER — OXYCODONE HCL ER 20 MG PO T12A: 20.0000 mg | EXTENDED_RELEASE_TABLET | Freq: Two times a day (BID) | ORAL | 0 refills | Status: DC

## 2020-08-20 NOTE — Telephone Encounter (Signed)
PMP was Reviewd: This provider spoke with Dr Carlis Abbott, she agrees to re-fill his Oxycontin. Oxycontin e-scribed today.

## 2020-08-20 NOTE — Progress Notes (Signed)
Collin Brown, Collin M. ([id number]) Visit Report for 08/16/2020 Allergy List Details Patient Name: Date of Service: Collin Brown, Collin MVHQITTHEW M. 08/16/2020 9:00 A M Medical Record Number: [id number] Patient Account Number: [id number] Date of Birth/Sex: Treating RN: 1986/03/03 (35 y.o. Collin Brown) Collin Brown Primary Care Collin Brown Allergies Active Allergies No Known Drug Allergies Type: Allergen Allergy Notes Electronic Signature(s) Signed: 08/20/2020 5:04:19 PM By: Zandra AbtsLynch, Shatara RN, BSN Entered By: Zandra AbtsLynch, Brown on 08/16/2020 09:18:11 -------------------------------------------------------------------------------- Arrival Information Details Patient Name: Date of Service: Collin Brown [name], [name] 08/16/2020 9:00 A M Medical Record Number: [id number] Patient Account Number: [id number] Date of Birth/Sex: Treating RN: 1986/03/03 (35 y.o. Collin Brown) Collin Brown Primary Care Collin Brown: Other Clinician: Referring Collin Brown: Treating Collin Brown: Collin Brown Weeks in Treatment: Brown Visit Information Patient Arrived: Ambulatory Arrival Time: 09:13 Accompanied By: mother Transfer Assistance: None Patient Identification Verified: Yes Secondary Verification Process Completed: Yes Patient Requires Transmission-Based Precautions: No Patient Has Alerts: No Electronic Signature(s) Signed: 08/20/2020 5:04:19 PM By: Zandra AbtsLynch, Shatara RN, BSN Entered By: Zandra AbtsLynch, Brown on 08/16/2020 09:15:39 -------------------------------------------------------------------------------- Clinic Level of Care Assessment Details Patient Name: Date of Service: Collin Brown [name], [name] 08/16/2020 9:00 A M Medical Record Number: [id number] Patient Account Number: [id number] Date of Birth/Sex: Treating RN: 1986/03/03 (35 y.o. Collin SoursM) Collin Brown Primary Care Koryn Charlot: Other Clinician: Referring Collin Brown: Treating  Collin Brown/Extender: Collin Brown Weeks in Treatment: Brown Clinic Level of Care Assessment Items TOOL 2 Quantity Score X- 1 Brown Use when only an EandM is performed on the INITIAL visit ASSESSMENTS - Nursing Assessment / Reassessment X- 1 20 General Physical Exam (combine w/ comprehensive assessment (listed just below) when performed on new pt. evals) X- 1 25 Comprehensive Assessment (HX, ROS, Risk Assessments, Wounds Hx, etc.) ASSESSMENTS - Wound and Skin A ssessment / Reassessment X - Simple Wound Assessment / Reassessment - one wound 1 5 []  - Brown Complex Wound Assessment / Reassessment - multiple wounds X- 1 10 Dermatologic / Skin Assessment (not related to wound area) ASSESSMENTS - Ostomy and/or Continence Assessment and Care []  - Brown Incontinence Assessment and Management []  - Brown Ostomy Care Assessment and Management (repouching, etc.) PROCESS - Coordination of Care X - Simple Patient / Family Education for ongoing care 1 15 []  - Brown Complex (extensive) Patient / Family Education for ongoing care X- 1 10 Staff obtains ChiropractorConsents, Records, T Results / Process Orders est X- 1 10 Staff telephones HHA, Nursing Homes / Clarify orders / etc []  - Brown Routine Transfer to another Facility (non-emergent condition) []  - Brown Routine Hospital Admission (non-emergent condition) X- 1 15 New Admissions / Manufacturing engineernsurance Authorizations / Ordering NPWT Apligraf, etc. , []  - Brown Emergency Hospital Admission (emergent condition) X- 1 10 Simple Discharge Coordination []  - Brown Complex (extensive) Discharge Coordination PROCESS - Special Needs []  - Brown Pediatric / Minor Patient Management []  - Brown Isolation Patient Management []  - Brown Hearing / Language / Visual special needs []  - Brown Assessment of Community assistance (transportation, D/C planning, etc.) []  - Brown Additional assistance / Altered mentation []  - Brown Support Surface(s) Assessment (bed, cushion, seat, etc.) INTERVENTIONS - Wound  Cleansing / Measurement X- 1 5 Wound Imaging (photographs - any number of wounds) []  - Brown Wound Tracing (instead of photographs) X- 1 5 Simple Wound Measurement - one wound []  - Brown Complex Wound Measurement - multiple wounds  X- 1 5 Simple Wound Cleansing - one wound []  - Brown Complex Wound Cleansing - multiple wounds INTERVENTIONS - Wound Dressings []  - Brown Small Wound Dressing one or multiple wounds X- 1 15 Medium Wound Dressing one or multiple wounds []  - Brown Large Wound Dressing one or multiple wounds []  - Brown Application of Medications - injection INTERVENTIONS - Miscellaneous []  - Brown External ear exam []  - Brown Specimen Collection (cultures, biopsies, blood, body fluids, etc.) []  - Brown Specimen(s) / Culture(s) sent or taken to Lab for analysis []  - Brown Patient Transfer (multiple staff / / Similar devices) []  - Brown Simple Staple / Suture removal (25 or less) []  - Brown Complex Staple / Suture removal (26 or more) []  - Brown Hypo / Hyperglycemic Management (close monitor of Blood Glucose) []  - Brown Ankle / Brachial Index (ABI) - do not check if billed separately Has the patient been seen at the hospital within the last three years: Yes Total Score: 150 Level Of Care: New/Established - Level 4 Electronic Signature(s) Signed: 08/16/2020 6:28:52 PM By: Entered By: on 08/16/2020 10:18:29 -------------------------------------------------------------------------------- Encounter Discharge Information Details Patient Name: Date of Service: , [name] 08/16/2020 9:00 A M Medical Record Number: Patient Account Number: Nurse, adult Date of Birth/Sex: Treating RN: March 17, 1986 (35 y.o. Primary Care Collin Brown: Other Clinician: Referring Collin Brown: Treating Collin Brown/Extender: , Brown Brown Weeks in Treatment: Brown Encounter Discharge Information Items Discharge Condition: Stable Ambulatory Status: Ambulatory Discharge  Destination: Home Transportation: Private Auto Accompanied By: self Schedule Follow-up Appointment: Yes Clinical Summary of Care: Patient Declined Electronic Signature(s) Signed: 08/16/2020 6:02:34 PM By: Shawn Stall RN, BSN Entered By: Shawn Stall on 08/16/2020 10:53:48 -------------------------------------------------------------------------------- Multi Wound Chart Details Patient Name: Date of Service: Collin Calamity, [name] 08/16/2020 9:00 A M Medical Record Number: [id number] Patient Account Number: [id number] Date of Birth/Sex: Treating RN: 09-10-1985 (35 y.o. [name] Primary Care Ashwini Jago: Other Clinician: Referring Braedon Sjogren: Treating Raenell Mensing/Extender: Collin Offer, Brown Brown Weeks in Treatment: Brown Vital Signs Height(in): 70 Pulse(bpm): 109 Weight(lbs): 341 Blood Pressure(mmHg): 138/86 Body Mass Index(BMI): 49 Temperature(F): 98.Brown Respiratory Rate(breaths/min): 18 Photos: [1:No Photos Sacrum] [N/A:N/A N/A] Wound Location: [1:Pressure Injury] [N/A:N/A] Wounding Event: [1:Pressure Ulcer] [N/A:N/A] Primary Etiology: [1:Anemia, Sleep Apnea, Hypertension, N/A] Comorbid History: [1:Type II Diabetes 05/18/2020] [N/A:N/A] Date Acquired: [1:Brown] [N/A:N/A] Weeks of Treatment: [1:Open] [N/A:N/A] Wound Status: [1:1.3x1.4x4.5] [N/A:N/A] Measurements L x W x D (cm) [1:1.429] [N/A:N/A] A (cm) : rea [1:6.432] [N/A:N/A] Volume (cm) : [1:9] Starting Position 1 (o'clock): [1:12] Ending Position 1 (o'clock): [1:4] Maximum Distance 1 (cm): [1:Yes] [N/A:N/A] 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] [N/A:N/A] Classification: [1:Medium] [N/A:N/A] Exudate A mount: [1:Serosanguineous] [N/A:N/A] Exudate Type: [1:red, brown] [N/A:N/A] Exudate Color: [1:Well defined, not attached] [N/A:N/A] Wound Margin: [1:Large (67-100%)] [N/A:N/A] Granulation A mount: [1:Pink] [N/A:N/A] Granulation Quality: [1:None Present (Brown%)] [N/A:N/A] Necrotic A mount: [1:Fat Layer (Subcutaneous  Tissue): Yes N/A] Exposed Structures: [1:Fascia: No Tendon: No Muscle: No Joint: No Bone: No None] [N/A:N/A] Treatment Notes Electronic Signature(s) Signed: 08/16/2020 5:18:36 PM By: Collin Brown Signed: 08/16/2020 6:28:52 PM By: Collin Brown Entered By: Collin Brown on 08/16/2020 10:33:20 -------------------------------------------------------------------------------- Multi-Disciplinary Care Plan Details Patient Name: Date of Service: [id number], [name] 08/16/2020 9:00 A M Medical Record Number: [id number] Patient Account Number: Collin Brown Date of Birth/Sex: Treating RN: 01/25/1986 (35 y.o. Collin Brown Primary Care Kyrel Leighton: Other Clinician: Referring Benedetto Ryder: Treating Jazmyne Beauchesne/Extender: [name], Brown  Brown Weeks in Treatment: Brown Active Inactive Nutrition Nursing Diagnoses: Potential for alteratiion in Nutrition/Potential for imbalanced nutrition Goals: Patient/caregiver agrees to and verbalizes understanding of need to obtain nutritional consultation Date Initiated: 08/16/2020 Target Resolution Date: 09/14/2020 Goal Status: Active Patient/caregiver verbalizes understanding of need to maintain therapeutic glucose control per primary care physician Date Initiated: 08/16/2020 Target Resolution Date: 10/04/2020 Goal Status: Active Interventions: Assess HgA1c results as ordered upon admission and as needed Provide education on elevated blood sugars and impact on wound healing Provide education on nutrition Treatment Activities: Obtain HgA1c : 08/16/2020 Patient referred to Primary Care Physician for further nutritional evaluation : 08/16/2020 Notes: Orientation to the Wound Care Program Nursing Diagnoses: Knowledge deficit related to the wound healing center program Goals: Patient/caregiver will verbalize understanding of the Wound Healing Center Program Date Initiated: 08/16/2020 Target Resolution Date: 09/14/2020 Goal Status: Active Interventions: Provide  education on orientation to the wound center Notes: Pressure Nursing Diagnoses: Knowledge deficit related to management of pressures ulcers Potential for impaired tissue integrity related to pressure, friction, moisture, and shear Goals: Patient will remain free from development of additional pressure ulcers Date Initiated: 08/16/2020 Target Resolution Date: 09/14/2020 Goal Status: Active Patient/caregiver will verbalize understanding of pressure ulcer management Date Initiated: 08/16/2020 Target Resolution Date: 09/15/2020 Goal Status: Active Interventions: Assess: immobility, friction, shearing, incontinence upon admission and as needed Assess potential for pressure ulcer upon admission and as needed Provide education on pressure ulcers Treatment Activities: Consult for HBO : 08/16/2020 Pressure reduction/relief device ordered : 08/16/2020 T ordered outside of clinic : 08/16/2020 est Notes: Wound/Skin Impairment Nursing Diagnoses: Knowledge deficit related to ulceration/compromised skin integrity Goals: Patient/caregiver will verbalize understanding of skin care regimen Date Initiated: 08/16/2020 Target Resolution Date: 09/14/2020 Goal Status: Active Interventions: Assess patient/caregiver ability to perform ulcer/skin care regimen upon admission and as needed Assess ulceration(s) every visit Provide education on ulcer and skin care Treatment Activities: Skin care regimen initiated : 08/16/2020 Topical wound management initiated : 08/16/2020 Notes: Electronic Signature(s) Signed: 08/16/2020 6:28:52 PM By: Shawn Stall Entered By: Shawn Stall on 08/16/2020 09:42:15 -------------------------------------------------------------------------------- Pain Assessment Details Patient Name: Date of Service: Collin Calamity, [name] 08/16/2020 9:00 A M Medical Record Number: [id number] Patient Account Number: [id number] Date of Birth/Sex: Treating RN: 1985-12-17 (35 y.o. Collin Brown Primary Care  Shaquasha Gerstel: Other Clinician: Referring Jordanne Elsbury: Treating Kanishk Stroebel/Extender: Collin Offer, Brown Brown Weeks in Treatment: Brown Active Problems Location of Pain Severity and Description of Pain Patient Has Paino No Site Locations Pain Management and Medication Current Pain Management: Electronic Signature(s) Signed: 08/20/2020 5:04:19 PM By: Zandra Abts RN, BSN Entered By: Zandra Abts on 08/16/2020 09:40:35 -------------------------------------------------------------------------------- Patient/Caregiver Education Details Patient Name: Date of Service: Collin Calamity, [name] 1/6/2022andnbsp9:00 A M Medical Record Number: [id number] Patient Account Number: [id number] Date of Birth/Gender: Treating RN: 07-09-1986 (35 y.o. Collin Brown Primary Care Physician: Other Clinician: Referring Physician: Treating Physician/Extender: Collin Offer, Brown Brown Weeks in Treatment: Brown Education Assessment Education Provided To: Patient Education Topics Provided Pressure: Handouts: Pressure Ulcers: Care and Offloading, Pressure Ulcers: Care and Offloading 2, Preventing Pressure Ulcers Methods: Explain/Verbal, Printed Responses: Reinforcements needed Welcome [facility]: o Handouts: Welcome T [facility] o Methods: Explain/Verbal, Printed Responses: Reinforcements needed Electronic Signature(s) Signed: 08/16/2020 6:28:52 PM By: Shawn Stall Entered By: Shawn Stall on 08/16/2020 09:42:47 -------------------------------------------------------------------------------- Wound Assessment Details Patient Name: Date of Service: Collin Calamity, [name] 08/16/2020 9:00 A M Medical Record Number: [id number]  Patient Account Number: [id number] Date of Birth/Sex: Treating RN: 10/12/1985 (35 y.o. Collin Brown Primary Care Niveah Boerner: Other Clinician: Referring Jiro Kiester: Treating Mckinnon Glick/Extender: Collin Offer, Brown Brown Weeks in Treatment: Brown Wound  Status Wound Number: 1 Primary Etiology: Pressure Ulcer Wound Location: Sacrum Wound Status: Open Wounding Event: Pressure Injury Comorbid History: Anemia, Sleep Apnea, Hypertension, Type II Diabetes Date Acquired: 05/18/2020 Weeks Of Treatment: Brown Clustered Wound: No Wound Measurements Length: (cm) 1.3 Width: (cm) 1.4 Depth: (cm) 4.5 Area: (cm) 1.429 Volume: (cm) 6.432 % Reduction in Area: % Reduction in Volume: Epithelialization: None Tunneling: No Undermining: Yes Starting Position (o'clock): 9 Ending Position (o'clock): 12 Maximum Distance: (cm) 4 Wound Description Classification: Category/Stage IV Wound Margin: Well defined, not attached Exudate Amount: Medium Exudate Type: Serosanguineous Exudate Color: red, brown Foul Odor After Cleansing: No Slough/Fibrino No Wound Bed Granulation Amount: Large (67-100%) Exposed Structure Granulation Quality: Pink Fascia Exposed: No Necrotic Amount: None Present (Brown%) Fat Layer (Subcutaneous Tissue) Exposed: Yes Tendon Exposed: No Muscle Exposed: No Joint Exposed: No Bone Exposed: No Treatment Notes Wound #1 (Sacrum) Cleanser Peri-Wound Care Skin Prep Discharge Instruction: Use skin prep as directed Topical Primary Dressing wet to dry dressing Discharge Instruction: pack into wound bed and undermining wet to dry dressing. apply until wound vac arrives. Secondary Dressing ABD Pad, 8x10 Discharge Instruction: Apply over primary dressing as directed. Secured With 40M Medipore H Soft Cloth Surgical T 4 x 2 (in/yd) ape Discharge Instruction: Secure dressing with tape as directed. Compression Wrap Compression Stockings Add-Ons Notes silver alginate to right great toenail bed Electronic Signature(s) Signed: 08/20/2020 5:04:19 PM By: Zandra Abts RN, BSN Entered By: Zandra Abts on 08/16/2020 09:40:12 -------------------------------------------------------------------------------- Vitals Details Patient Name: Date  of Service: Collin Calamity, [name] 08/16/2020 9:00 A M Medical Record Number: [id number] Patient Account Number: [id number] Date of Birth/Sex: Treating RN: 08/10/1986 (35 y.o. Collin Brown Primary Care Sharelle Burditt: Other Clinician: Referring Aayla Marrocco: Treating Nolan Tuazon/Extender: Collin Offer, Brown Brown Weeks in Treatment: Brown Vital Signs Time Taken: 09:16 Temperature (F): 98.Brown Height (in): 70 Pulse (bpm): 109 Source: Stated Respiratory Rate (breaths/min): 18 Weight (lbs): 341 Blood Pressure (mmHg): 138/86 Source: Stated Reference Range: 80 - 120 mg / dl Body Mass Index (BMI): 48.9 Electronic Signature(s) Signed: 08/20/2020 5:04:19 PM By: Zandra Abts RN, BSN Entered By: Zandra Abts on 08/16/2020 09:17:54

## 2020-08-20 NOTE — Progress Notes (Signed)
Left upper arm x ray is without abnormality.  Swelling could be lymphedema, versus other underlying orthopedic  cause, refer to orthopedics.
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
## 2020-08-20 NOTE — Progress Notes (Signed)
No fracture noted left elbow, soft tissue swelling only. Please refer to orthopedics for further work up or imaging.

## 2020-08-20 NOTE — Progress Notes (Signed)
Opacity in the right lower lung still remains- from covid, he has been referred to pulmonary.  (to nurse he has multiple results when you call)

## 2020-08-20 NOTE — Progress Notes (Signed)
Collin Brown ([id number]) Visit Report for 08/16/2020 Abuse/Suicide Risk Screen Details Patient Name: Date of Service: Collin Brown, Collin Brown Collin M. 08/16/2020 9:00 A M Medical Record Number: [id number] Patient Account Number: [id number] Date of Birth/Sex: Treating RN: 1986/04/17 (35 y.o. [name] Primary Care [name]: Other Clinician: Referring Aveer Bartow: Treating Bekki Tavenner/Extender: Michaele Offer, PA MELA Weeks in Treatment: 0 Abuse/Suicide Risk Screen Items Answer ABUSE RISK SCREEN: Has anyone close to you tried to hurt or harm you recentlyo No Do you feel uncomfortable with anyone in your familyo No Has anyone forced you do things that you didnt want to doo No Electronic Signature(s) Signed: 08/20/2020 5:04:19 PM By: Zandra Abts RN, BSN Entered By: Zandra Abts on 08/16/2020 09:32:06 -------------------------------------------------------------------------------- Activities of Daily Living Details Patient Name: Date of Service: Collin Brown, Collin Brown Collin M. 08/16/2020 9:00 A M Medical Record Number: [id number] Patient Account Number: [id number] Date of Birth/Sex: Treating RN: 18-Apr-1986 (35 y.o. [name] Primary Care Shaune Malacara: Other Clinician: Referring Elonna Mcfarlane: Treating Najir Roop/Extender: Michaele Offer, PA MELA Weeks in Treatment: 0 Activities of Daily Living Items Answer Activities of Daily Living (Please select one for each item) Drive Automobile Need Assistance T Medications ake Completely Able Use T elephone Completely Able Care for Appearance Completely Able Use T oilet Completely Able Bath / Shower Completely Able Dress Self Completely Able Feed Self Completely Able Walk Completely Able Get In / Out Bed Completely Able Housework Need Assistance Prepare Meals Need Assistance Handle Money Completely Able Shop for Self Need Assistance Electronic Signature(s) Signed: 08/20/2020 5:04:19 PM By: Zandra Abts RN, BSN Entered By: Zandra Abts  on 08/16/2020 09:33:57 -------------------------------------------------------------------------------- Education Screening Details Patient Name: Date of Service: Collin Calamity, MA Collin M. 08/16/2020 9:00 A M Medical Record Number: [id number] Patient Account Number: [id number] Date of Birth/Sex: Treating RN: 05-08-1986 (35 y.o. [name] Primary Care [name]: Other Clinician: Referring Arabela Basaldua: Treating Deshonna Trnka/Extender: Michaele Offer, PA MELA Weeks in Treatment: 0 Primary Learner Assessed: Patient Learning Preferences/Education Level/Primary Language Learning Preference: Explanation, Demonstration, Printed Material Highest Education Level: College or Above Preferred Language: English Cognitive Barrier Language Barrier: No Translator Needed: No Memory Deficit: No Emotional Barrier: No Cultural/Religious Beliefs Affecting Medical Care: No Physical Barrier Impaired Vision: No Impaired Hearing: No Decreased Hand dexterity: No Knowledge/Comprehension Knowledge Level: High Comprehension Level: High Ability to understand written instructions: High Ability to understand verbal instructions: High Motivation Anxiety Level: Calm Cooperation: Cooperative Education Importance: Acknowledges Need Interest in Health Problems: Asks Questions Perception: Coherent Willingness to Engage in Self-Management High Activities: Readiness to Engage in Self-Management High Activities: Electronic Signature(s) Signed: 08/20/2020 5:04:19 PM By: Zandra Abts RN, BSN Entered By: Zandra Abts on 08/16/2020 09:34:23 -------------------------------------------------------------------------------- Fall Risk Assessment Details Patient Name: Date of Service: Collin Calamity, MA Collin M. 08/16/2020 9:00 A M Medical Record Number: [id number] Patient Account Number: [id number] Date of Birth/Sex: Treating RN: 09-Nov-1985 (35 y.o. [name] Primary Care Lakeasha Petion: Other Clinician: Referring  [name]: Treating Indira Sorenson/Extender: Michaele Offer, PA MELA Weeks in Treatment: 0 Fall Risk Assessment Items Have you had 2 or more falls in the last 12 monthso 0 No Have you had any fall that resulted in injury in the last 12 monthso 0 No FALLS RISK SCREEN History of falling - immediate or within 3 months 0 No Secondary diagnosis (Do you have 2 or more medical diagnoseso) 0 No Ambulatory aid None/bed rest/wheelchair/nurse 0 Yes Crutches/cane/walker 0 No Furniture 0 No Intravenous therapy Access/Saline/Heparin Boykin Reaper  0 No Gait/Transferring Normal/ bed rest/ wheelchair 0 Yes Weak (short steps with or without shuffle, stooped but able to lift head while walking, may seek 0 No support from furniture) Impaired (short steps with shuffle, may have difficulty arising from chair, head down, impaired 0 No balance) Mental Status Oriented to own ability 0 Yes Electronic Signature(s) Signed: 08/20/2020 5:04:19 PM By: Zandra Abts RN, BSN Entered By: Zandra Abts on 08/16/2020 09:36:04 -------------------------------------------------------------------------------- Nutrition Risk Screening Details Patient Name: Date of Service: Collin Calamity, MA Collin M. 08/16/2020 9:00 A M Medical Record Number: [id number] Patient Account Number: [id number] Date of Birth/Sex: Treating RN: Apr 13, 1986 (35 y.o. [name] Primary Care Damyan Corne: Other Clinician: Referring Quinterrius Errington: Treating Naiomi Musto/Extender: Michaele Offer, PA MELA Weeks in Treatment: 0 Height (in): 70 Weight (lbs): 341 Body Mass Index (BMI): 48.9 Nutrition Risk Screening Items Score Screening NUTRITION RISK SCREEN: I have an illness or condition that made me change the kind and/or amount of food I eat 2 Yes I eat fewer than two meals per day 0 No I eat few fruits and vegetables, or milk products 0 No I have three or more drinks of beer, liquor or wine almost every day 0 No I have tooth or mouth problems that make  it hard for me to eat 0 No I don't always have enough money to buy the food I need 0 No I eat alone most of the time 0 No I take three or more different prescribed or over-the-counter drugs a day 1 Yes Without wanting to, I have lost or gained 10 pounds in the last six months 0 No I am not always physically able to shop, cook and/or feed myself 0 No Nutrition Protocols Good Risk Protocol Moderate Risk Protocol 0 Provide education on nutrition High Risk Proctocol Risk Level: Moderate Risk Score: 3 Electronic Signature(s) Signed: 08/20/2020 5:04:19 PM By: Zandra Abts RN, BSN Entered By: Zandra Abts on 08/16/2020 09:36:12

## 2020-08-20 NOTE — Progress Notes (Signed)
Soft tissue swelling only , no fracture seen. Advise referral to ortho.

## 2020-08-20 NOTE — Telephone Encounter (Signed)
Mackie's mother called and his oxy IR was filled but the pharmacy it was sent to  (in [place]) could not fill the oxycontin. THey have cancelled the Rx for oxycontin and his mother asks that his meds be sent from now on to [facility] in [place]. I have made the selection in medication list. He needs the refill re ordered for his oxycontin. His appt is 09/06/20 with Dr Carlis Abbott.

## 2020-08-20 NOTE — Telephone Encounter (Signed)
Notified. 

## 2020-08-20 NOTE — Progress Notes (Signed)
Soft tissue swelling only, refer to orthopedics.

## 2020-08-21 ENCOUNTER — Telehealth: Payer: Self-pay

## 2020-08-21 DIAGNOSIS — M7989 Other specified soft tissue disorders: Secondary | ICD-10-CM

## 2020-08-21 NOTE — Telephone Encounter (Signed)
-----   Message from Berniece Pap, FNP sent at 08/20/2020  8:25 AM EST ----- Soft tissue swelling only, refer to orthopedics.

## 2020-08-21 NOTE — Telephone Encounter (Signed)
Copied from CRM [id number]. Topic: General - Other >> Aug 21, 2020  1:27 PM Lyn Hollingshead D wrote: PT returning call for lab results / please advise

## 2020-08-21 NOTE — Telephone Encounter (Signed)
Tommy,PTA/ADVHH called to report fall from patient. Patient is ok.

## 2020-08-21 NOTE — Telephone Encounter (Signed)
Patient advised to have labs drawn this week so form can be mailed out to employer.KW

## 2020-08-21 NOTE — Telephone Encounter (Signed)
[id number] 

## 2020-08-22 ENCOUNTER — Telehealth: Payer: Self-pay | Admitting: Registered Nurse

## 2020-08-22 ENCOUNTER — Telehealth: Payer: Self-pay | Admitting: *Deleted

## 2020-08-22 NOTE — Telephone Encounter (Signed)
Received a call from Ms. Lince, she states [name] cost is over 500 dollars, Sybil RN called the pharmacy and this was verified. Purvis Sheffield CMA submitted a PA today. PMP was reviewed. Oxycodone 5 mg was filled on 08/17/2020.  Last Oxycontin prescription was filled on 07/20/2020. Ms. Azucena Cecil was instructed to increase Mr. Oxycodone 5 mg tablet to two tablets ( 10 Mg) every 6 hours today, also instructed to call office tomorrow to check on the PA, she verbalizes understanding.

## 2020-08-22 NOTE — Telephone Encounter (Signed)
Collin Brown's oxycontin costs $532 and he cannot afford.  His mother called and reports something needs to be done.  Collin Brown looked at formulary and Oxycontin is tier 4 and oxycodone ER is tier 2.  He will first try to submit PA and see if that changes anything before we change medication completely.

## 2020-08-23 ENCOUNTER — Encounter (HOSPITAL_BASED_OUTPATIENT_CLINIC_OR_DEPARTMENT_OTHER): Payer: BC Managed Care – PPO | Admitting: Internal Medicine

## 2020-08-23 ENCOUNTER — Encounter: Payer: Self-pay | Admitting: Adult Health

## 2020-08-23 ENCOUNTER — Ambulatory Visit: Payer: Self-pay | Admitting: Family Medicine

## 2020-08-23 DIAGNOSIS — R0683 Snoring: Secondary | ICD-10-CM | POA: Insufficient documentation

## 2020-08-23 DIAGNOSIS — H6123 Impacted cerumen, bilateral: Secondary | ICD-10-CM | POA: Insufficient documentation

## 2020-08-23 DIAGNOSIS — M7989 Other specified soft tissue disorders: Secondary | ICD-10-CM | POA: Insufficient documentation

## 2020-08-23 DIAGNOSIS — Z8616 Personal history of COVID-19: Secondary | ICD-10-CM | POA: Insufficient documentation

## 2020-08-23 DIAGNOSIS — M25522 Pain in left elbow: Secondary | ICD-10-CM | POA: Insufficient documentation

## 2020-08-23 DIAGNOSIS — R0600 Dyspnea, unspecified: Secondary | ICD-10-CM

## 2020-08-23 DIAGNOSIS — U099 Post covid-19 condition, unspecified: Secondary | ICD-10-CM | POA: Insufficient documentation

## 2020-08-23 HISTORY — DX: Dyspnea, unspecified: R06.00

## 2020-08-23 NOTE — Telephone Encounter (Signed)
I have notified Collin Brown's mother that we are going to discontinue the oxycontin and he will continue with the oxycodone 5 mg (2 tabs) q 6 hours.  He has enough pills to make it to 08/29/20.  She asks that Dr Carlis Abbott switch him to 10 mg oxycodone at that time and he will take 1 pill q 6 hours.

## 2020-08-24 LAB — COMPREHENSIVE METABOLIC PANEL
ALT: 26 IU/L (ref 0–44)
AST: 17 IU/L (ref 0–40)
Albumin/Globulin Ratio: 1.9 (ref 1.2–2.2)
Albumin: 4.1 g/dL (ref 4.0–5.0)
Alkaline Phosphatase: 68 IU/L (ref 44–121)
BUN/Creatinine Ratio: 19 (ref 9–20)
BUN: 11 mg/dL (ref 6–20)
Bilirubin Total: 0.3 mg/dL (ref 0.0–1.2)
CO2: 22 mmol/L (ref 20–29)
Calcium: 10 mg/dL (ref 8.7–10.2)
Chloride: 102 mmol/L (ref 96–106)
Creatinine, Ser: 0.57 mg/dL — ABNORMAL LOW (ref 0.76–1.27)
GFR calc Af Amer: 156 mL/min/{1.73_m2} (ref >59)
GFR calc non Af Amer: 135 mL/min/{1.73_m2} (ref >59)
Globulin, Total: 2.2 g/dL (ref 1.5–4.5)
Glucose: 108 mg/dL — ABNORMAL HIGH (ref 65–99)
Potassium: 4.7 mmol/L (ref 3.5–5.2)
Sodium: 139 mmol/L (ref 134–144)
Total Protein: 6.3 g/dL (ref 6.0–8.5)

## 2020-08-24 LAB — CBC WITH DIFFERENTIAL/PLATELET
Basophils Absolute: 0 10*3/uL (ref 0.0–0.2)
Basos: 1 %
EOS (ABSOLUTE): 0.2 10*3/uL (ref 0.0–0.4)
Eos: 2 %
Hematocrit: 41.5 % (ref 37.5–51.0)
Hemoglobin: 13.4 g/dL (ref 13.0–17.7)
Immature Grans (Abs): 0 10*3/uL (ref 0.0–0.1)
Immature Granulocytes: 1 %
Lymphocytes Absolute: 1.8 10*3/uL (ref 0.7–3.1)
Lymphs: 23 %
MCH: 27.2 pg (ref 26.6–33.0)
MCHC: 32.3 g/dL (ref 31.5–35.7)
MCV: 84 fL (ref 79–97)
Monocytes Absolute: 0.6 10*3/uL (ref 0.1–0.9)
Monocytes: 8 %
Neutrophils Absolute: 5.2 10*3/uL (ref 1.4–7.0)
Neutrophils: 65 %
Platelets: 294 10*3/uL (ref 150–450)
RBC: 4.92 x10E6/uL (ref 4.14–5.80)
RDW: 15.1 % (ref 11.6–15.4)
WBC: 7.9 10*3/uL (ref 3.4–10.8)

## 2020-08-24 LAB — LIPID PANEL
Chol/HDL Ratio: 6.8 ratio — ABNORMAL HIGH (ref 0.0–5.0)
Cholesterol, Total: 230 mg/dL — ABNORMAL HIGH (ref 100–199)
HDL: 34 mg/dL — ABNORMAL LOW (ref >39)
LDL Chol Calc (NIH): 141 mg/dL — ABNORMAL HIGH (ref 0–99)
Triglycerides: 301 mg/dL — ABNORMAL HIGH (ref 0–149)
VLDL Cholesterol Cal: 55 mg/dL — ABNORMAL HIGH (ref 5–40)

## 2020-08-24 LAB — TSH: TSH: 1.04 u[IU]/mL (ref 0.450–4.500)

## 2020-08-24 LAB — HEMOGLOBIN A1C
Est. average glucose Bld gHb Est-mCnc: 114 mg/dL
Hgb A1c MFr Bld: 5.6 % (ref 4.8–5.6)

## 2020-08-26 NOTE — Progress Notes (Signed)
CBC is normal now compared to 1 month ago. Glucose mild elevation,hemoglobin A1C diabetes was 7.2 3 months ago and now 5.6 much improved. Continue medications prescribed for diabetes and keep endocrinology appointment.  Monitor glucose for hypoglycemia. Creatinine is stable. If he is fasting cholesterol needs some work, triglycerides should decrease with better glucose control.  Total cholesterol and LDL elevated.  Discuss lifestyle modification with patient e.g. increase exercise, fiber, fruits, vegetables, lean meat, and omega 3/fish intake and decrease saturated fat.  If patient following strict diet and exercise program already please schedule follow up appointment with primary care physician TSH for thyroid is within normal.   Recheck lipid panel in 4 months and A1C.

## 2020-08-28 ENCOUNTER — Ambulatory Visit: Payer: Self-pay | Admitting: Adult Health

## 2020-08-30 ENCOUNTER — Encounter (HOSPITAL_BASED_OUTPATIENT_CLINIC_OR_DEPARTMENT_OTHER): Payer: BC Managed Care – PPO | Admitting: Internal Medicine

## 2020-08-30 ENCOUNTER — Other Ambulatory Visit: Payer: Self-pay

## 2020-08-30 ENCOUNTER — Telehealth: Payer: Self-pay | Admitting: *Deleted

## 2020-08-30 ENCOUNTER — Inpatient Hospital Stay: Payer: BC Managed Care – PPO | Admitting: Physical Medicine and Rehabilitation

## 2020-08-30 ENCOUNTER — Other Ambulatory Visit: Payer: Self-pay | Admitting: Physical Medicine and Rehabilitation

## 2020-08-30 DIAGNOSIS — L89154 Pressure ulcer of sacral region, stage 4: Secondary | ICD-10-CM | POA: Diagnosis not present

## 2020-08-30 MED ORDER — OXYCODONE HCL 10 MG PO TABS: 10.0000 mg | ORAL_TABLET | Freq: Three times a day (TID) | ORAL | 0 refills | Status: DC | PRN

## 2020-08-30 NOTE — Telephone Encounter (Signed)
Hi Sybil, Can you please ask pharmacy to refer this question to his PCP? Thank you!
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
## 2020-08-30 NOTE — Telephone Encounter (Signed)
Kathlene November from [facility] called because there is a problem with his metoprolol order and they need to speak with Dr Carlis Abbott to find out if they can switch the medication.  [phone].

## 2020-08-31 NOTE — Telephone Encounter (Signed)
I have left message for pharmacy to refer question to the primary care Marcelino Duster Flinchim FNP.

## 2020-08-31 NOTE — Progress Notes (Signed)
Patient has not viewed original MyChart message:  CBC is normal now compared to 1 month ago. Glucose mild elevation,hemoglobin A1C diabetes was 7.2 3 months ago and now 5.6 much improved. Continue medications prescribed for diabetes and keep endocrinology appointment. Monitor glucose for hypoglycemia. Creatinine is stable. If he is fasting cholesterol needs some work, triglycerides should decrease with better glucose control. Total cholesterol and LDL elevated. Discuss lifestyle modification with patient e.g. increase exercise, fiber, fruits, vegetables, lean meat, and omega 3/fish intake and decrease saturated fat. If patient following strict diet and exercise program already please schedule follow up appointment with primary care physician TSH for thyroid is within normal.   Recheck lipid panel in 4 months and A1C.

## 2020-09-03 ENCOUNTER — Other Ambulatory Visit: Payer: Self-pay | Admitting: Adult Health

## 2020-09-03 MED ORDER — METOPROLOL TARTRATE 50 MG PO TABS: 75.0000 mg | ORAL_TABLET | Freq: Two times a day (BID) | ORAL | 0 refills | Status: AC

## 2020-09-05 ENCOUNTER — Ambulatory Visit (INDEPENDENT_AMBULATORY_CARE_PROVIDER_SITE_OTHER): Payer: BC Managed Care – PPO | Admitting: Neurology

## 2020-09-05 ENCOUNTER — Other Ambulatory Visit: Payer: Self-pay

## 2020-09-05 ENCOUNTER — Encounter: Payer: Self-pay | Admitting: Neurology

## 2020-09-05 VITALS — BP 118/84 | HR 90 | Ht 70.0 in | Wt 365.0 lb

## 2020-09-05 DIAGNOSIS — Z9981 Dependence on supplemental oxygen: Secondary | ICD-10-CM | POA: Diagnosis not present

## 2020-09-05 DIAGNOSIS — G4734 Idiopathic sleep related nonobstructive alveolar hypoventilation: Secondary | ICD-10-CM

## 2020-09-05 DIAGNOSIS — G4733 Obstructive sleep apnea (adult) (pediatric): Secondary | ICD-10-CM

## 2020-09-05 DIAGNOSIS — J1282 Pneumonia due to coronavirus disease 2019: Secondary | ICD-10-CM

## 2020-09-05 DIAGNOSIS — R0601 Orthopnea: Secondary | ICD-10-CM

## 2020-09-05 DIAGNOSIS — E1169 Type 2 diabetes mellitus with other specified complication: Secondary | ICD-10-CM

## 2020-09-05 DIAGNOSIS — J9601 Acute respiratory failure with hypoxia: Secondary | ICD-10-CM

## 2020-09-05 DIAGNOSIS — U071 COVID-19: Secondary | ICD-10-CM | POA: Diagnosis not present

## 2020-09-05 DIAGNOSIS — E119 Type 2 diabetes mellitus without complications: Secondary | ICD-10-CM | POA: Insufficient documentation

## 2020-09-05 DIAGNOSIS — U099 Post covid-19 condition, unspecified: Secondary | ICD-10-CM

## 2020-09-05 DIAGNOSIS — E662 Morbid (severe) obesity with alveolar hypoventilation: Secondary | ICD-10-CM

## 2020-09-05 DIAGNOSIS — L89154 Pressure ulcer of sacral region, stage 4: Secondary | ICD-10-CM | POA: Insufficient documentation

## 2020-09-05 NOTE — Progress Notes (Signed)
SLEEP MEDICINE CLINIC    Provider:  Melvyn Novas, MD  Primary Care Physician:  Berniece Pap, FNP [address] [place] [address]     Referring Provider: Berniece Pap, Fnp [address] [place],  [place] [address]          Chief Complaint according to patient   Patient presents with:    . New Patient (Initial Visit)     Collin Brown is a morbidly obese caucasian male with witnessed sleep apnea. His mother bought a CPAP from a friend and he uses it , never had a sleep study, did not bring CPAP.  He was in ICU for COVID 19 in September.  Referral has not mentioned this at all.        HISTORY OF PRESENT ILLNESS:  Collin Brown is a 35 - year- old  Caucasian male patient see upon consultation request on 09/05/2020 by FNP [name].  He is an active patient of [facility] for general neurology. We are addressing SLEEP MEDICINE only.     Chief concern according to patient : Collin Brown has been falling asleep in sometimes dangerous situations, driving home from work, whenever not physically active or mentally stimulated he could easily drop off to sleep.  He works a lot of overtime and will at home he was basically just noted to go to sleep as soon as he could briefly taking a shower and then his family noticed that he had apnea at night.  His employment situation changed during the pandemic.  He was hospitalized with COVID-19 in September 2021 and had a prolonged hospital stay ICU for 16 day. Pneumonia, and still not able to return to work. He is now considered disabled. He worked at [employer].  His mother then purchased a CPAP machine privately. He has used the CPAP everey day now. He has a good experience with it. Feels rested. He has a wound vac. Has his arm in a case.   The patient has not brought his CPAP here - " I was not told to ".   Collin Brown  has a past medical history of Acute blood loss anemia, Critical  illness myopathy, Diabetes mellitus with complication (HCC),  Persistent SOB. 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 Long term ICU stay for COVID Pneumonia. Respiratory failure.  He was seen inn 07-2020 -  Dr Allena Katz stated EMG and NCV were unsuccessful, she ordered gabapentin. .   The patient never had a sleep study.    Social history:  Patient is working in Engineering geologist  and lives in a household with mother.  Family status is single The patient currently out n medical leave.   Tobacco use; never , second hand smoke.  ETOH use ; never ,  Caffeine intake in form of Coffee( /) Soda( 1-2 / day) Tea (/) or energy drinks. Regular exercise- none.      Sleep habits are as follows: all sleep habits have changed since hospitalization- The patient's dinner time is between 5 PM. The patient goes to bed at 10-11 PM and falls asleep for 4.5 hours , wakes from pain- needs more medication and goes back for another 2 hours of sleep. Total sleep time 5-7 hours, is on wound vac- the sounds are present all night.  The preferred sleep position is any comfortable position- right side, cant breath on his back, can stay  on his left side ( ARM has to be elevated) , with the support of 3-4 pillows.  Dreams are reportedly rare since ICU.  7 AM is the usual rise time.  The patient wakes up spontaneously.  He reports not feeling refreshed or restored in AM, with symptoms such as dry mouth, morning headaches, and residual fatigue.  Naps are taken infrequently, every 2 weeks or such- lasting from 20-40 minutes and in relation to pain- .  Review of Systems: Out of a complete 14 system review, the patient complains of only the following symptoms, and all other reviewed systems are negative.:  Fatigue, sleepiness , snoring, fragmented sleep, Insomnia    How likely are you to doze in the following situations: 0 = not likely, 1 = slight chance, 2 = moderate  chance, 3 = high chance   Sitting and Reading? Watching Television? Sitting inactive in a public place (theater or meeting)? As a passenger in a car for an hour without a break? Lying down in the afternoon when circumstances permit? Sitting and talking to someone? Sitting quietly after lunch without alcohol? In a car, while stopped for a few minutes in traffic?   Total = 5/ 24 points   FSS endorsed at 44/ 63 points.   Social History   Socioeconomic History  . Marital status: Single    Spouse name: Not on file  . Number of children: Not on file  . Years of education: Not on file  . Highest education level: Not on file  Occupational History  . Occupation: Risk manager  Tobacco Use  . Smoking status: Never Smoker  . Smokeless tobacco: Never Used  Vaping Use  . Vaping Use: Never used  Substance and Sexual Activity  . Alcohol use: No  . Drug use: No  . Sexual activity: Yes    Birth control/protection: None  Other Topics Concern  . Not on file  Social History Narrative  . Not on file   Social Determinants of Health   Financial Resource Strain: Not on file  Food Insecurity: Not on file  Transportation Needs: Not on file  Physical Activity: Not on file  Stress: Not on file  Social Connections: Not on file    Family History  Problem Relation Age of Onset  . Diabetes Mellitus II Mother   . Hypertension Mother   . Diabetes Mother   . Diabetes Mellitus II Father   . High blood pressure Father   . Heart attack Father     Past Medical History:  Diagnosis Date  . Acute blood loss anemia   . Critical illness myopathy   . Diabetes mellitus without complication (HCC)   . Dyspnea 08/23/2020  . Hearing loss in right ear   . Hypertension   . Sleep apnea     Past Surgical History:  Procedure Laterality Date  . TONSILLECTOMY    . WOUND DEBRIDEMENT N/A 06/29/2020   Procedure: DEBRIDEMENT SACRAL WOUND;  Surgeon: Axel Filler, MD;  Location: [facility];  Service:  General;  Laterality: N/A;     Current Outpatient Medications on File Prior to Visit  Medication Sig Dispense Refill  . acetaminophen (TYLENOL) 325 MG tablet Take 1.5-2 tablets (487.5-650 mg total) by mouth every 6 (six) hours as needed for mild pain, fever or headache.    Marland Kitchen ascorbic acid (VITAMIN C) 500 MG tablet Take 1 tablet (500 mg total) by mouth daily. 30 tablet 0  . aspirin 81 MG chewable tablet Chew 1 tablet (81  mg total) by mouth daily.    . collagenase (SANTYL) ointment Apply topically daily. Apply to sacrum ulcer, then cover with damp to dry dressing. 30 g 4  . Fluticasone-Salmeterol (ADVAIR DISKUS) 250-50 MCG/DOSE AEPB Inhale 1 puff into the lungs 2 (two) times daily. 1 each 1  . gabapentin (NEURONTIN) 400 MG capsule Take 1 capsule (400 mg total) by mouth 3 (three) times daily. 90 capsule 0  . insulin lispro (HUMALOG KWIKPEN) 100 UNIT/ML KwikPen Inject 4 Units into the skin 3 (three) times daily. Take with meals (as long as blood sugars are over 80) 15 mL 11  . Insulin Pen Needle (CAREFINE PEN NEEDLES) 32G X 6 MM MISC 1 applicator by Does not apply route 4 (four) times daily -  with meals and at bedtime. 200 each 1  . LANTUS SOLOSTAR 100 UNIT/ML Solostar Pen Inject into the skin.    . metoprolol tartrate (LOPRESSOR) 50 MG tablet Take 1.5 tablets (75 mg total) by mouth 2 (two) times daily. 180 tablet 0  . Multiple Vitamin (MULTIVITAMIN WITH MINERALS) TABS tablet Take 1 tablet by mouth daily.    . nutrition supplement, JUVEN, (JUVEN) PACK Take 1 packet by mouth 2 (two) times daily between meals. 60 packet 0  . ondansetron (ZOFRAN) 4 MG tablet Take 1 tablet (4 mg total) by mouth every 8 (eight) hours as needed for nausea or vomiting. 20 tablet 0  . Oxycodone HCl 10 MG TABS Take 1 tablet (10 mg total) by mouth every 8 (eight) hours as needed. 90 tablet 0  . potassium chloride SA (KLOR-CON) 20 MEQ tablet Take 2 tablets (40 mEq total) by mouth 2 (two) times daily. 120 tablet 0  .  senna-docusate (SENOKOT-S) 8.6-50 MG tablet Take 2 tablets by mouth 2 (two) times daily. 120 tablet 0   No current facility-administered medications on file prior to visit.    No Known Allergies  Physical exam:  There were no vitals filed for this visit. There is no height or weight on file to calculate BMI.   Wt Readings from Last 3 Encounters:  08/17/20 (!) 365 lb (165.6 kg)  07/19/20 (!) 341 lb (154.7 kg)  07/12/20 (!) 428 lb (194.1 kg)     Ht Readings from Last 3 Encounters:  07/19/20 5\' 10"  (1.778 m)  07/09/20 5\' 10"  (1.778 m)  05/12/20 5\' 10"  (1.778 m)      General: The patient is awake, alert and appears not in acute distress.  The patient is well groomed. Head: Normocephalic, atraumatic. Neck is supple. Mallampati 2- tiny uvula, small mouth opening.  Status post tonsillectomy.  neck circumference:19 inches . Nasal airflow patent.   Retrognathia is  seen.  Dental status: biological  Cardiovascular:  Regular rate and cardiac rhythm by pulse,  without distended neck veins. Respiratory: Lungs are clear to auscultation.  Skin:   evidence of ankle edema,- o edema in left hand and arm  Trunk: The patient's posture is erect.   Neurologic exam : The patient is awake and alert, oriented to place and time.   Memory subjective described as intact.  Attention span & concentration ability appears normal.  Speech is fluent,  without  dysarthria, dysphonia or aphasia.  Mood and affect are appropriate.   Cranial nerves: no loss of smell or taste reported - never  Pupils are equal and briskly reactive to light. Funduscopic exam without pallor or edema. .  Extraocular movements in vertical and horizontal planes were intact and without nystagmus. No Diplopia.  Visual fields by finger perimetry are intact. Hearing was intact to soft voice and finger rubbing.    Facial sensation intact to fine touch.  Facial motor strength is symmetric and tongue and uvula move midline.  Neck ROM :  rotation, tilt and flexion extension were normal for age and shoulder shrug was symmetrical.    Motor exam:  Symmetric bulk, tone and ROM. For legs. He has an almost flaccid left arm. Brachial plexopathy-   Normal tone without cog wheeling, symmetric grip strength . Sensory:  Fine touch, pinprick and vibration were tested  and  normal.  Proprioception tested in the upper extremities was normal. Coordination: Rapid alternating movements in the fingers of the right.handwriting preserved.  The Finger-to-nose maneuver was only tested on the right- without evidence of ataxia, dysmetria or tremor. Gait and station: Patient could barely rise from a seated position,Deep tendon reflexes: in the  lower extremities are symmetric and absent. Babinski response was deferred.     After spending a total time of 60 minutes face to face and additional time for physical and neurologic examination, review of laboratory studies,  personal review of imaging studies, reports and results of other testing and review of referral information / records as far as provided in visit, I have established the following assessments:  High complexity.   1) Critical care neuropathy, myopathy and pressure sore, COVID-19 related pneumonia with ongoing SOB, dyspnea, can't sleep flat - ORTHOPNEA.  2) morbid obesity and status post pneumonia, acute respiratory failure 3)  Hypoventilation and witnessed apnea.    My Plan is to proceed with:  1) I would like for this young man to have an attended sleep study as soon as possible, in bed #2 this is an adjustable bed but will allow his wounds to be not as uncomfortable and he can elevate the head of bed to facilitate his breathing.  Collin Brown has an acquired respiratory failure related to COVID-19 infection 4 months ago, he was hospitalized in intensive care on a respirator for 16 days, he had acute respiratory failure.  Prior to that it was known to his family that he had apneic breathing  at night.  Now I think we have a mixture of acute and chronic respiratory failure, brachial plexopathy,  and possible hypoventilation related to long-term changes of the lungs, obesity, and myopathy.  He has acquired a critical care illness and will be covered slowly for the next 12 to 24 months.    Our goal is to make it easier for him to breathe at night, allowing him to wake up refreshed, and allowing him to continue taking pressure off his wound by finding the best way for him to sleep.  He already owns a CPAP machine that his mom purchased privately, I do not have any access to the machine now I do not know which maker and machine type sclerosis.  My goal is to do a split study in the attended setting and if he needs BiPAP we will titrate him to BiPAP and if he needs oxygen in addition we will give him oxygen in addition.    I would like to thank  [name], Eula Fried, Fnp [address] [place],  [place] [address] for allowing me to meet with and to take care of this pleasant patient.   In short, Collin Brown is presenting with urgent need for a SPLIT STUDY with possilbe need of oxygen and BiPAP.  I plan to follow up either  personally or through our NP within 3 month.   CC: I will share my notes with PCP and Dr Nita Sickleonika Patel, DP .  Electronically signed by: Melvyn Novasarmen Jessey Huyett, MD 09/05/2020 10:24 AM  [facility] and [facility] Board certified by The [employer] of Sleep Medicine and Diplomate of the [employer] of [employer] Medicine.Board certified In Neurology through the ABPN. Medical Director of [facility].

## 2020-09-06 ENCOUNTER — Encounter: Payer: BC Managed Care – PPO | Admitting: Physical Medicine and Rehabilitation

## 2020-09-07 ENCOUNTER — Other Ambulatory Visit: Payer: Self-pay

## 2020-09-07 ENCOUNTER — Encounter
Payer: BC Managed Care – PPO | Attending: Physical Medicine and Rehabilitation | Admitting: Physical Medicine and Rehabilitation

## 2020-09-07 ENCOUNTER — Telehealth: Payer: Self-pay

## 2020-09-07 ENCOUNTER — Ambulatory Visit: Payer: Self-pay | Admitting: Adult Health

## 2020-09-07 ENCOUNTER — Encounter: Payer: Self-pay | Admitting: Physical Medicine and Rehabilitation

## 2020-09-07 VITALS — BP 129/82 | HR 109 | Temp 98.0°F | Ht 70.0 in | Wt 364.0 lb

## 2020-09-07 DIAGNOSIS — R110 Nausea: Secondary | ICD-10-CM | POA: Insufficient documentation

## 2020-09-07 DIAGNOSIS — G7281 Critical illness myopathy: Secondary | ICD-10-CM | POA: Diagnosis present

## 2020-09-07 DIAGNOSIS — G90512 Complex regional pain syndrome I of left upper limb: Secondary | ICD-10-CM | POA: Insufficient documentation

## 2020-09-07 DIAGNOSIS — G894 Chronic pain syndrome: Secondary | ICD-10-CM

## 2020-09-07 MED ORDER — GABAPENTIN 800 MG PO TABS: 800.0000 mg | ORAL_TABLET | Freq: Four times a day (QID) | ORAL | 1 refills | Status: DC | PRN

## 2020-09-07 MED ORDER — ONDANSETRON HCL 4 MG PO TABS: 4.0000 mg | ORAL_TABLET | Freq: Three times a day (TID) | ORAL | 0 refills | Status: DC | PRN

## 2020-09-07 MED ORDER — OXYCODONE-ACETAMINOPHEN 10-325 MG PO TABS: 1.0000 | ORAL_TABLET | Freq: Four times a day (QID) | ORAL | 0 refills | Status: DC | PRN

## 2020-09-07 NOTE — Addendum Note (Signed)
Addended by: Horton Chin on: 09/07/2020 04:01 PM   Modules accepted: Level of Service

## 2020-09-07 NOTE — Telephone Encounter (Signed)
Darral Dash, OT from [facility] called requesting verbal orders to ext [facility] 2wk4. Orders approved and given.

## 2020-09-07 NOTE — Patient Instructions (Signed)
-  Discussed following foods that may reduce pain: 1) Ginger 2) Blueberries 3) Salmon 4) Pumpkin seeds 5) dark chocolate 6) turmeric 7) tart cherries 8) virgin olive oil 9) chilli peppers 10) mint  Link to further information on diet for chronic pain: [email or web address]://[email or web address].[email or web address]/treatments/[email or web address]/diet-patients-[email or web address]-pain  

## 2020-09-07 NOTE — Progress Notes (Addendum)
Subjective:    Patient ID: Collin Brown, male    DOB: September 20, 1985, 35 y.o.   MRN: [id number]  HPI: Collin Brown is a 35 y.o. 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 Collin Brown presented to [facility] on 05/03/2020 with SOB and generalized weakness and fever for a few days. Patient presented to ED after being positive for COVID-19, he reported he thinks he was exposed to his Coworker who was COVID positive, however his mother had tested positive as well per ED note from Dr Allena Katz.  He was started on COVID protocol with remdesivir.   Collin Brown had a complicated hospital stay, see discharge summaries for details.   Neurology was consulted for left upper extremity weakness, he received steroids per Discharge summary.   DG Chest:  IMPRESSION: Diffuse bilateral ground-glass airspace opacities consistent with the patient's history of viral pneumonia  Collin Brown was admitted to inpatient rehabilitation on 06/08/2020.   Collin Brown was noted  with sacral wound stage 4, wound care was following. He has a scheduled appointment with wound care.   Collin Brown was discharged on 07/13/2020, he is receiving Home Health therapy from [facility]. He states his pain is located in his  lower back and sacral wound. He rates his pain 5. He reports he is walking with walker in his home to transfer from chair to wheelchair.   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 Pain is on average 7/10, pain right now is 5/10. His sensation has returned in his arm with the exception of his pinky. He has the sweetest OT. He has been trying to switch between Tylenol and Ibuprofen. He continues on Gabapentin. He has been taking 1200mg  threes times per  day. The pain feels sharp, stabbing, and tingling. Collin Brown Morphine equivalent is 60.00 MME.  Oral Swab was Performed previously. Pain contract signed previously.   2) Sacral pressure injury: He has been following with wound care. He is seeing orthopedics on Wednesday. They ar every pleased with nursing and physician wound care.   Collin Brown mother in room and all questions answered.   Pain Inventory Average Pain 7 Pain Right Now 5 My pain is sharp, stabbing and tingling  In the last 24 hours, has pain interfered with the following? General activity 5 Relation with others 5 Enjoyment of life 7 What TIME of day is your pain at its worst? morning , evening and night Sleep (in general) Poor  Pain is worse with: walking, bending, sitting, inactivity and standing Pain improves with: medication Relief from Meds: 6          Family History  Problem Relation Age of Onset  . Diabetes Mellitus II Mother   . Hypertension Mother   . Diabetes Mother   . Diabetes Mellitus II Father   . High blood pressure Father   . Heart attack Father    Social History   Socioeconomic History  . Marital status: Single    Spouse name: Not on file  . Number of children: Not on file  . Years of education: Not on file  . Highest education level: Not on file  Occupational History  . Occupation: [employer]  Tobacco Use  . Smoking status: Never Smoker  . Smokeless tobacco:  Never Used  Vaping Use  . Vaping Use: Never used  Substance and Sexual Activity  . Alcohol use: No  . Drug use: No  . Sexual activity: Yes    Birth control/protection: None  Other Topics Concern  . Not on file  Social History Narrative  . Not on file   Social Determinants of Health   Financial Resource Strain: Not on file  Food Insecurity: Not on file  Transportation Needs: Not on file  Physical Activity: Not on file  Stress: Not on file  Social Connections: Not on file   Past Surgical History:  Procedure  Laterality Date  . TONSILLECTOMY    . WOUND DEBRIDEMENT N/A 06/29/2020   Procedure: DEBRIDEMENT SACRAL WOUND;  Surgeon: Axel Filler, MD;  Location: [facility] OR;  Service: General;  Laterality: N/A;   Past Medical History:  Diagnosis Date  . Acute blood loss anemia   . Critical illness myopathy   . Diabetes mellitus without complication (HCC)   . Dyspnea 08/23/2020  . Hearing loss in right ear   . Hypertension   . Sleep apnea    There were no vitals taken for this visit.  Opioid Risk Score:   Fall Risk Score:  `1  Depression screen PHQ 2/9  Depression screen [facility] 2/9 08/17/2020 07/19/2020  Decreased Interest 1 2  Down, Depressed, Hopeless 1 0  PHQ - 2 Score 2 2  Altered sleeping 2 3  Tired, decreased energy 2 2  Change in appetite 0 1  Feeling bad or failure about yourself  1 1  Trouble concentrating 0 0  Moving slowly or fidgety/restless 0 0  Suicidal thoughts 0 0  PHQ-9 Score 7 9  Difficult doing work/chores Extremely dIfficult Very difficult    Review of Systems     Objective:    Physical Exam Vitals and nursing note reviewed.  Constitutional:      Appearance: Normal appearance.  Cardiovascular:     Rate and Rhythm: Normal rate and regular rhythm.     Pulses: Normal pulses.     Heart sounds: Normal heart sounds.  Pulmonary:     Effort: Pulmonary effort is normal.     Breath sounds: Normal breath sounds.  Musculoskeletal:     Cervical back: Normal range of motion and neck supple.     Comments: Normal Muscle Bulk and Muscle Testing Reveals:  Upper Extremities: Right: Full ROM and Muscle Strength 5/5 Left Upper Extremity: Decreased ROM and Muscle Strength 0/5 in shoulder abduction, 2/5 EF and EE, 0/5 WE and hand grip. Tremor in left hand.  Lumbar Hypersensitivity Lower Extremities: Decreased ROM and Muscle Strength 4/5 Able to walk without device.  Skin:    General: Skin is warm and dry.     Comments: Sacral Dressing Intact, wound vac in place.  Neurological:      Mental Status: He is alert and oriented to person, place, and time.  Psychiatric:        Mood and Affect: Mood normal.        Behavior: Behavior normal.         Assessment & Plan:  1.Critical Illness Myopathy: Continue Home Health Therapy with [facility] 2. 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 Continue to Monitor.  3. Essential Hypertension: Continue current medication regimen. Continue to monitor. He has a scheduled appointment with PCP.  4. Diabetes Mellitus New Onset.Continue current medication regimen and he has a scheduled appointment with PCP.  5. Sacral Wound: Continue [facility]: Wound Center.  6. Left sided brachial plexus neuropathy: CT and MRI ordered. [place] negative for blood clots. Continue follow-up with orthopedics.  -Refer to pain center for sympathetic nerve block for CRPS.  -Refilled Gabapentin 800mg  up to 4 times per day.  -Discussed current symptoms of pain and history of pain.  -Discussed benefits of exercise in reducing pain. -Discussed following foods that may reduce pain: 1) Ginger 2) Blueberries 3) Salmon 4) Pumpkin seeds 5) dark chocolate 6) turmeric 7) tart cherries 8) virgin olive oil 9) chilli peppers 10) mint  Link to further information on diet for chronic pain:  Left humerus XR ordered and there is no evidence of fracture. MRI has been ordered.   We will continue the opioid monitoring program, this consists of regular clinic visits, examinations, urine drug screen, pill counts as well as use of http://www.bray.com/ Controlled Substance Reporting system. A 12 month History has been reviewed on the [place] Controlled Substance Reporting System on 07/19/2020.   7. Nausea: Refilled Zofran.   F/U in 4 weeks.   40 minutes spent in physical examination, discussion of pain,  nature of pain, pain medication options, therapy, humerus XR, nausea, reviewing the medications and pain management referral I have send today

## 2020-09-11 ENCOUNTER — Other Ambulatory Visit: Payer: Self-pay

## 2020-09-11 ENCOUNTER — Ambulatory Visit (INDEPENDENT_AMBULATORY_CARE_PROVIDER_SITE_OTHER): Payer: BC Managed Care – PPO | Admitting: Adult Health

## 2020-09-11 ENCOUNTER — Encounter: Payer: Self-pay | Admitting: Adult Health

## 2020-09-11 VITALS — BP 124/90 | HR 85 | Temp 98.0°F | Resp 16 | Wt 368.6 lb

## 2020-09-11 DIAGNOSIS — Z6841 Body Mass Index (BMI) 40.0 and over, adult: Secondary | ICD-10-CM | POA: Diagnosis not present

## 2020-09-11 DIAGNOSIS — E1169 Type 2 diabetes mellitus with other specified complication: Secondary | ICD-10-CM

## 2020-09-11 DIAGNOSIS — K047 Periapical abscess without sinus: Secondary | ICD-10-CM | POA: Diagnosis not present

## 2020-09-11 MED ORDER — BLOOD GLUCOSE METER KIT: PACK | 0 refills | Status: DC

## 2020-09-11 MED ORDER — POTASSIUM CHLORIDE CRYS ER 20 MEQ PO TBCR: 40.0000 meq | EXTENDED_RELEASE_TABLET | Freq: Two times a day (BID) | ORAL | 0 refills | Status: DC

## 2020-09-11 MED ORDER — AMOXICILLIN-POT CLAVULANATE 875-125 MG PO TABS: 1.0000 | ORAL_TABLET | Freq: Two times a day (BID) | ORAL | 0 refills | Status: DC

## 2020-09-11 NOTE — Patient Instructions (Signed)
Dental Abscess  A dental abscess is an area of pus in or around a tooth. It comes from an infection. It can cause pain and other symptoms. Treatment will help with symptoms and prevent the infection from spreading. Follow these instructions at home: Medicines  Take over-the-counter and prescription medicines only as told by your dentist.  If you were prescribed an antibiotic medicine, take it as told by your dentist. Do not stop taking it even if you start to feel better.  If you were prescribed a gel that has numbing medicine in it, use it exactly as told.  Do not drive or use heavy machinery (like a Surveyor, mining) while taking prescription pain medicine. General instructions  Rinse out your mouth often with salt water. ? To make salt water, dissolve -1 tsp of salt in 1 cup of warm water.  Eat a soft diet while your mouth is healing.  Drink enough fluid to keep your urine pale yellow.  Do not apply heat to the outside of your mouth.  Do not use any products that contain nicotine or tobacco. These include cigarettes and e-cigarettes. If you need help quitting, ask your doctor.  Keep all follow-up visits as told by your dentist. This is important.   Prevent an abscess  Brush your teeth every morning and every night. Use fluoride toothpaste.  Floss your teeth each day.  Get dental cleanings as often as told by your dentist.  Think about getting dental sealant put on teeth that have deep holes (decay).  Drink water that has fluoride in it. ? Most tap water has fluoride. ? Check the label on bottled water to see if it has fluoride in it.  Drink water instead of sugary drinks.  Eat healthy meals and snacks.  Wear a mouth guard or face shield when you play sports. Contact a doctor if:  Your pain is worse, and medicine does not help. Get help right away if:  You have a fever or chills.  Your symptoms suddenly get worse.  You have a very bad headache.  You have problems  breathing or swallowing.  You have trouble opening your mouth.  You have swelling in your neck or close to your eye. Summary  A dental abscess is an area of pus in or around a tooth. It is caused by an infection.  Treatment will help with symptoms and prevent the infection from spreading.  Take over-the-counter and prescription medicines only as told by your dentist.  To prevent an abscess, take good care of your teeth. Brush your teeth every morning and night. Use floss every day.  Get dental cleanings as often as told by your dentist. This information is not intended to replace advice given to you by your health care provider. Make sure you discuss any questions you have with your health care provider. Document Revised: 02/17/2020 Document Reviewed: 02/17/2020 Elsevier Patient Education  2021 Elsevier Inc. Amoxicillin; Clavulanic Acid Tablets What is this medicine? AMOXICILLIN; CLAVULANIC ACID (a mox i SIL in; KLAV yoo lan ic AS id) is a penicillin antibiotic. It treats some infections caused by bacteria. It will not work for colds, the flu, or other viruses. This medicine may be used for other purposes; ask your health care provider or pharmacist if you have questions. COMMON BRAND NAME(S): Augmentin What should I tell my health care provider before I take this medicine? They need to know if you have any of these conditions:  kidney disease  liver disease  mononucleosis  stomach or intestine problems such as colitis  an unusual or allergic reaction to amoxicillin, other penicillin or cephalosporin antibiotics, clavulanic acid, other medicines, foods, dyes, or preservatives  pregnant or trying to get pregnant  breast-feeding How should I use this medicine? Take this drug by mouth. Take it as directed on the prescription label at the same time every day. Take it with food at the start of a meal or snack. Take all of this drug unless your health care provider tells you to  stop it early. Keep taking it even if you think you are better. Talk to your health care provider about the use of this drug in children. While it may be prescribed for selected conditions, precautions do apply. Overdosage: If you think you have taken too much of this medicine contact a poison control center or emergency room at once. NOTE: This medicine is only for you. Do not share this medicine with others. What if I miss a dose? If you miss a dose, take it as soon as you can. If it is almost time for your next dose, take only that dose. Do not take double or extra doses. What may interact with this medicine?  allopurinol  anticoagulants  birth control pills  methotrexate  probenecid This list may not describe all possible interactions. Give your health care provider a list of all the medicines, herbs, non-prescription drugs, or dietary supplements you use. Also tell them if you smoke, drink alcohol, or use illegal drugs. Some items may interact with your medicine. What should I watch for while using this medicine? Tell your health care provider if your symptoms do not start to get better or if they get worse. This medicine may cause serious skin reactions. They can happen weeks to months after starting the medicine. Contact your health care provider right away if you notice fevers or flu-like symptoms with a rash. The rash may be red or purple and then turn into blisters or peeling of the skin. Or, you might notice a red rash with swelling of the face, lips or lymph nodes in your neck or under your arms. Do not treat diarrhea with over the counter products. Contact your health care provider if you have diarrhea that lasts more than 2 days or if it is severe and watery. If you have diabetes, you may get a false-positive result for sugar in your urine. Check with your health care provider. Birth control may not work properly while you are taking this medicine. Talk to your health care  provider about using an extra method of birth control. What side effects may I notice from receiving this medicine? Side effects that you should report to your doctor or health care provider as soon as possible:  allergic reactions (skin rash, itching or hives; swelling of the face, lips, or tongue)  bloody or watery diarrhea  dark urine  infection (fever, chills, cough, sore throat, or pain)  kidney injury (trouble passing urine or change in the amount of urine)  redness, blistering, peeling, or loosening of the skin, including inside the mouth  seizures  thrush (white patches in the mouth or mouth sores)  trouble breathing  unusual bruising or bleeding  unusually weak or tired Side effects that usually do not require medical attention (report to your doctor or health care provider if they continue or are bothersome):  diarrhea  dizziness  headache  nausea, vomiting  unusual vaginal discharge, itching, or odor  upset stomach This  list may not describe all possible side effects. Call your doctor for medical advice about side effects. You may report side effects to FDA at 1-800-FDA-1088. Where should I keep my medicine? Keep out of the reach of children and pets. Store at room temperature between 20 and 25 degrees C (68 and 77 degrees F). Throw away any unused drug after the expiration date. NOTE: This sheet is a summary. It may not cover all possible information. If you have questions about this medicine, talk to your doctor, pharmacist, or health care provider.  2021 Elsevier/Gold Standard (2020-06-20 09:45:03)

## 2020-09-11 NOTE — Progress Notes (Signed)
Established patient visit   Patient: Collin Brown   DOB: October 11, 1985   35 y.o. Male  MRN: [id number] Visit Date: 09/11/2020  Today's healthcare provider: Marcille Buffy, FNP   Chief Complaint  Patient presents with  . Follow-up   Subjective    HPI  Patient presents in office today for follow up visit. He has an abscess tooth. Has dentist appointment for follow up, tooth is broke.   Since our last visit he has seen Neurology had sleep study, and switching from CPAP - BIPAP.   He now has  A wound vacuum for his sacral wound followed by Dr. Quentin Cornwall.  Seeing endocrinologist on 10/24/2020 Dr. Gabriel Carina.   He is seeing orthopedic for his left arm for his left arm pain. Negative DVT.Marland Kitchenx rays were good.   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  History of respiratory failure.  Patient  denies any fever, body aches,chills, rash, chest pain, nausea, vomiting, or diarrhea.  Denies dizziness, lightheadedness, pre syncopal or syncopal episodes.    Past Medical History:  Diagnosis Date  . Acute blood loss anemia   . Critical illness myopathy   . Diabetes mellitus without complication ([place])   . Dyspnea 08/23/2020  . Hearing loss in right ear   . Hypertension   . Sleep apnea    Past Surgical History:  Procedure Laterality Date  . TONSILLECTOMY    . WOUND DEBRIDEMENT N/A 06/29/2020   Procedure: DEBRIDEMENT SACRAL WOUND;  Surgeon: Ralene Ok, MD;  Location: [facility];  Service: General;  Laterality: N/A;   Social History   Tobacco Use  . Smoking status: Never Smoker  . Smokeless tobacco: Never Used  Vaping Use  . Vaping Use: Never used  Substance Use Topics  . Alcohol use: No  . Drug use: No   Social History   Socioeconomic History  . Marital status: Single    Spouse name: Not on file  . Number of children: Not on file  . Years of education: Not on file  . Highest education level: Not on file  Occupational History  . Occupation: Neurosurgeon  Tobacco Use  . Smoking status: Never Smoker  . Smokeless tobacco: Never Used  Vaping Use  . Vaping Use: Never used  Substance and Sexual Activity  . Alcohol use: No  . Drug use: No  . Sexual activity: Yes    Birth control/protection: None  Other Topics Concern  . Not on file  Social History Narrative  . Not on file   Social Determinants of Health   Financial Resource Strain: Not on file  Food Insecurity: Not on file  Transportation Needs: Not on file  Physical Activity: Not on file  Stress: Not on file  Social Connections: Not on file  Intimate Partner Violence: Not on file   Family Status  Relation Name Status  . Mother  Alive  . Father  Alive   Family History  Problem Relation Age of Onset  . Diabetes Mellitus II Mother   . Hypertension Mother   . Diabetes Mother   . Diabetes Mellitus II Father   . High blood pressure Father   . Heart attack Father    No Known Allergies     Medications: Outpatient Medications Prior to Visit  Medication Sig  . ascorbic acid (VITAMIN C) 500 MG tablet Take 1 tablet (500 mg total) by mouth daily.  Marland Kitchen aspirin 81 MG chewable tablet Chew 1 tablet (81 mg  total) by mouth daily.  . Fluticasone-Salmeterol (ADVAIR DISKUS) 250-50 MCG/DOSE AEPB Inhale 1 puff into the lungs 2 (two) times daily.  Marland Kitchen gabapentin (NEURONTIN) 800 MG tablet Take 1 tablet (800 mg total) by mouth 4 (four) times daily as needed.  . insulin lispro (HUMALOG KWIKPEN) 100 UNIT/ML KwikPen Inject 4 Units into the skin 3 (three) times daily. Take with meals (as long as blood sugars are over 80)  . Insulin Pen Needle (CAREFINE PEN NEEDLES) 32G X 6 MM MISC 1 applicator by Does not apply route 4 (four) times daily -  with meals and at bedtime.  Marland Kitchen LANTUS SOLOSTAR 100 UNIT/ML Solostar Pen Inject into the skin.  . metoprolol tartrate (LOPRESSOR) 50 MG tablet Take 1.5 tablets (75 mg total) by mouth 2 (two) times daily.  . Multiple Vitamin (MULTIVITAMIN WITH MINERALS) TABS  tablet Take 1 tablet by mouth daily.  . ondansetron (ZOFRAN) 4 MG tablet Take 1 tablet (4 mg total) by mouth every 8 (eight) hours as needed for nausea or vomiting.  Marland Kitchen oxyCODONE-acetaminophen (PERCOCET) 10-325 MG tablet Take 1 tablet by mouth every 6 (six) hours as needed for pain.  Marland Kitchen senna-docusate (SENOKOT-S) 8.6-50 MG tablet Take 2 tablets by mouth 2 (two) times daily.  . [DISCONTINUED] potassium chloride SA (KLOR-CON) 20 MEQ tablet Take 2 tablets (40 mEq total) by mouth 2 (two) times daily.   No facility-administered medications prior to visit.    Review of Systems  Constitutional: Positive for fatigue. Negative for activity change, appetite change, chills, diaphoresis, fever and unexpected weight change.  HENT: Positive for dental problem. Negative for drooling, ear discharge, ear pain, facial swelling, hearing loss, mouth sores, nosebleeds, postnasal drip, rhinorrhea, sinus pressure, sinus pain, sneezing, sore throat, tinnitus, trouble swallowing and voice change.   Respiratory: Positive for shortness of breath. Negative for apnea, cough, choking, chest tightness, wheezing and stridor.   Cardiovascular: Negative for chest pain, palpitations and leg swelling.  Gastrointestinal: Negative.   Genitourinary: Negative.   Musculoskeletal: Positive for arthralgias (left arm chronic seeing orthopedics. ).  Skin: Positive for wound (with wound vac ). Negative for color change, pallor and rash.  Neurological: Negative.   Psychiatric/Behavioral: Negative.     Last CBC Lab Results  Component Value Date   WBC 7.9 08/23/2020   HGB 13.4 08/23/2020   HCT 41.5 08/23/2020   MCV 84 08/23/2020   MCH 27.2 08/23/2020   RDW 15.1 08/23/2020   PLT 294 62/69/4854   Last metabolic panel Lab Results  Component Value Date   GLUCOSE 108 (H) 08/23/2020   NA 139 08/23/2020   K 4.7 08/23/2020   CL 102 08/23/2020   CO2 22 08/23/2020   BUN 11 08/23/2020   CREATININE 0.57 (L) 08/23/2020   GFRNONAA 135  08/23/2020   GFRAA 156 08/23/2020   CALCIUM 10.0 08/23/2020   PHOS 4.5 05/30/2020   PROT 6.3 08/23/2020   ALBUMIN 4.1 08/23/2020   LABGLOB 2.2 08/23/2020   AGRATIO 1.9 08/23/2020   BILITOT 0.3 08/23/2020   ALKPHOS 68 08/23/2020   AST 17 08/23/2020   ALT 26 08/23/2020   ANIONGAP 13 07/06/2020   Last lipids Lab Results  Component Value Date   CHOL 230 (H) 08/23/2020   HDL 34 (L) 08/23/2020   LDLCALC 141 (H) 08/23/2020   TRIG 301 (H) 08/23/2020   CHOLHDL 6.8 (H) 08/23/2020   Last hemoglobin A1c Lab Results  Component Value Date   HGBA1C 5.6 08/23/2020   Last thyroid functions Lab Results  Component  Value Date   TSH 1.040 08/23/2020   Last vitamin D No results found for: 25OHVITD2, 25OHVITD3, VD25OH Last vitamin B12 and Folate No results found for: VITAMINB12, FOLATE   Objective    BP 124/90   Pulse 85   Temp 98 F (36.7 C) (Oral)   Resp 16   Wt (!) 368 lb 9.6 oz (167.2 kg)   SpO2 95%   BMI 52.89 kg/m  BP Readings from Last 3 Encounters:  09/11/20 124/90  09/07/20 129/82  09/05/20 118/84   Wt Readings from Last 3 Encounters:  09/11/20 (!) 368 lb 9.6 oz (167.2 kg)  09/07/20 (!) 364 lb (165.1 kg)  09/05/20 (!) 365 lb (165.6 kg)       Physical Exam Vitals and nursing note reviewed.  Constitutional:      General: He is active. He is not in acute distress.    Appearance: Normal appearance. He is well-developed and well-nourished. He is obese. He is not ill-appearing, toxic-appearing, sickly-appearing or diaphoretic.     Comments: Patient is alert and oriented and responsive to questions Engages in eye contact with provider. Speaks in full sentences without any pauses without any shortness of breath or distress.    HENT:     Head: Normocephalic and atraumatic.     Right Ear: Hearing, tympanic membrane, ear canal and external ear normal.     Left Ear: Hearing, tympanic membrane, ear canal and external ear normal.     Nose: Nose normal.     Mouth/Throat:      Mouth: Oropharynx is clear and moist and mucous membranes are normal.     Pharynx: Uvula midline. No oropharyngeal exudate.  Eyes:     General: Lids are normal. No scleral icterus.       Right eye: No discharge.        Left eye: No discharge.     Extraocular Movements: EOM normal.     Conjunctiva/sclera: Conjunctivae normal.     Pupils: Pupils are equal, round, and reactive to light.  Neck:     Thyroid: No thyromegaly.     Vascular: Normal carotid pulses. No carotid bruit, hepatojugular reflux or JVD.     Trachea: Trachea and phonation normal. No tracheal tenderness or tracheal deviation.     Meningeal: Brudzinski's sign absent.  Cardiovascular:     Rate and Rhythm: Normal rate and regular rhythm.     Pulses: Normal pulses and intact distal pulses.     Heart sounds: Normal heart sounds, S1 normal and S2 normal. Heart sounds not distant. No murmur heard. No friction rub. No gallop.   Pulmonary:     Effort: Pulmonary effort is normal. No accessory muscle usage or respiratory distress.     Breath sounds: Normal breath sounds. No stridor. No wheezing, rhonchi or rales.  Chest:     Chest wall: No tenderness.  Abdominal:     General: Bowel sounds are normal. There is no distension. Aorta is normal.     Palpations: Abdomen is soft. There is no mass.     Tenderness: There is no abdominal tenderness. There is no guarding or rebound.     Hernia: No hernia is present.  Musculoskeletal:        General: No tenderness, deformity or edema. Normal range of motion.     Cervical back: Full passive range of motion without pain, normal range of motion and neck supple.     Comments: Patient moves on and off of exam table and in  room without difficulty. Gait is normal in hall and in room. Patient is oriented to person place time and situation. Patient answers questions appropriately and engages in conversation.   Lymphadenopathy:     Head:     Right side of head: No submental, submandibular,  tonsillar, preauricular, posterior auricular or occipital adenopathy.     Left side of head: No submental, submandibular, tonsillar, preauricular, posterior auricular or occipital adenopathy.     Cervical: No cervical adenopathy.     Upper Body:  No axillary adenopathy present. Skin:    General: Skin is warm, dry and intact.     Capillary Refill: Capillary refill takes less than 2 seconds.     Coloration: Skin is not pale.     Findings: Wound present. No erythema or rash.     Nails: There is no clubbing or cyanosis.     Comments: Sacral wound with wound vacuum.   Neurological:     Mental Status: He is alert and oriented to person, place, and time.     GCS: GCS eye subscore is 4. GCS verbal subscore is 5. GCS motor subscore is 6.     Cranial Nerves: No cranial nerve deficit.     Sensory: No sensory deficit.     Motor: No abnormal muscle tone.     Coordination: He displays a negative Romberg sign. Coordination normal.     Gait: Gait normal.     Deep Tendon Reflexes: Strength normal and reflexes are normal and symmetric. Reflexes normal.  Psychiatric:        Mood and Affect: Mood and affect normal.        Speech: Speech normal.        Behavior: Behavior normal.        Thought Content: Thought content normal.        Cognition and Memory: Cognition and memory normal.        Judgment: Judgment normal.      No results found for any visits on 09/11/20.  Assessment & Plan    Dental infection - Plan: amoxicillin-clavulanate (AUGMENTIN) 875-125 MG tablet  Type 2 diabetes mellitus with other specified complication, without long-term current use of insulin ([place]) - Plan: blood glucose meter kit and supplies  Body mass index (BMI) of 50-59.9 in adult [facility])  Augmentin as prescribed for dental infection follow up with dentist as soon as possible.  Diet and exercise weight loss for BMI advised.  Keep specialty follow up.   Red Flags discussed. The patient was given clear instructions to go  to ER or return to medical center if any red flags develop, symptoms do not improve, worsen or new problems develop. They verbalized understanding.  Diabetes / chronic follow up. Labs  If needed.   Return in about 3 months (around 12/09/2020), or if symptoms worsen or fail to improve, for at any time for any worsening symptoms, Go to Emergency room/ urgent care if worse.      Red Flags discussed. The patient was given clear instructions to go to ER or return to medical center if any red flags develop, symptoms do not improve, worsen or new problems develop. They verbalized understanding.  The entirety of the information documented in the History of Present Illness, Review of Systems and Physical Exam were personally obtained by me. Portions of this information were initially documented by the CMA and reviewed by me for thoroughness and accuracy.      Marcille Buffy, [place]  [phone] (phone) [phone] (fax)  [place]

## 2020-09-12 DIAGNOSIS — I10 Essential (primary) hypertension: Secondary | ICD-10-CM

## 2020-09-12 DIAGNOSIS — G40909 Epilepsy, unspecified, not intractable, without status epilepticus: Secondary | ICD-10-CM

## 2020-09-12 DIAGNOSIS — U099 Post covid-19 condition, unspecified: Secondary | ICD-10-CM | POA: Diagnosis not present

## 2020-09-12 DIAGNOSIS — N390 Urinary tract infection, site not specified: Secondary | ICD-10-CM

## 2020-09-12 DIAGNOSIS — B961 Klebsiella pneumoniae [K. pneumoniae] as the cause of diseases classified elsewhere: Secondary | ICD-10-CM

## 2020-09-12 DIAGNOSIS — M6281 Muscle weakness (generalized): Secondary | ICD-10-CM | POA: Diagnosis not present

## 2020-09-12 DIAGNOSIS — G5621 Lesion of ulnar nerve, right upper limb: Secondary | ICD-10-CM

## 2020-09-12 DIAGNOSIS — G8314 Monoplegia of lower limb affecting left nondominant side: Secondary | ICD-10-CM

## 2020-09-12 DIAGNOSIS — D62 Acute posthemorrhagic anemia: Secondary | ICD-10-CM

## 2020-09-12 DIAGNOSIS — M47812 Spondylosis without myelopathy or radiculopathy, cervical region: Secondary | ICD-10-CM

## 2020-09-12 DIAGNOSIS — D72819 Decreased white blood cell count, unspecified: Secondary | ICD-10-CM

## 2020-09-12 DIAGNOSIS — H9191 Unspecified hearing loss, right ear: Secondary | ICD-10-CM

## 2020-09-12 DIAGNOSIS — J1282 Pneumonia due to coronavirus disease 2019: Secondary | ICD-10-CM | POA: Diagnosis not present

## 2020-09-12 DIAGNOSIS — A029 Salmonella infection, unspecified: Secondary | ICD-10-CM

## 2020-09-12 DIAGNOSIS — E1165 Type 2 diabetes mellitus with hyperglycemia: Secondary | ICD-10-CM

## 2020-09-12 DIAGNOSIS — G540 Brachial plexus disorders: Secondary | ICD-10-CM

## 2020-09-12 DIAGNOSIS — M25511 Pain in right shoulder: Secondary | ICD-10-CM

## 2020-09-12 DIAGNOSIS — L89154 Pressure ulcer of sacral region, stage 4: Secondary | ICD-10-CM

## 2020-09-12 DIAGNOSIS — E662 Morbid (severe) obesity with alveolar hypoventilation: Secondary | ICD-10-CM

## 2020-09-12 DIAGNOSIS — G4733 Obstructive sleep apnea (adult) (pediatric): Secondary | ICD-10-CM

## 2020-09-12 DIAGNOSIS — K5903 Drug induced constipation: Secondary | ICD-10-CM

## 2020-09-12 DIAGNOSIS — F209 Schizophrenia, unspecified: Secondary | ICD-10-CM

## 2020-09-12 DIAGNOSIS — G9341 Metabolic encephalopathy: Secondary | ICD-10-CM

## 2020-09-12 DIAGNOSIS — J9621 Acute and chronic respiratory failure with hypoxia: Secondary | ICD-10-CM | POA: Diagnosis not present

## 2020-09-12 DIAGNOSIS — Z6841 Body Mass Index (BMI) 40.0 and over, adult: Secondary | ICD-10-CM

## 2020-09-14 ENCOUNTER — Encounter (HOSPITAL_BASED_OUTPATIENT_CLINIC_OR_DEPARTMENT_OTHER): Payer: BC Managed Care – PPO | Attending: Internal Medicine | Admitting: Internal Medicine

## 2020-09-14 ENCOUNTER — Other Ambulatory Visit (HOSPITAL_COMMUNITY)
Admission: RE | Admit: 2020-09-14 | Discharge: 2020-09-14 | Disposition: A | Discharge status: Home / Self Care | Payer: BC Managed Care – PPO | Source: Other Acute Inpatient Hospital | Attending: Internal Medicine | Admitting: Internal Medicine

## 2020-09-14 ENCOUNTER — Other Ambulatory Visit: Payer: Self-pay

## 2020-09-14 DIAGNOSIS — L89154 Pressure ulcer of sacral region, stage 4: Secondary | ICD-10-CM | POA: Insufficient documentation

## 2020-09-14 DIAGNOSIS — Z794 Long term (current) use of insulin: Secondary | ICD-10-CM | POA: Diagnosis not present

## 2020-09-14 DIAGNOSIS — E11622 Type 2 diabetes mellitus with other skin ulcer: Secondary | ICD-10-CM | POA: Insufficient documentation

## 2020-09-14 DIAGNOSIS — E1151 Type 2 diabetes mellitus with diabetic peripheral angiopathy without gangrene: Secondary | ICD-10-CM | POA: Diagnosis present

## 2020-09-14 DIAGNOSIS — B999 Unspecified infectious disease: Secondary | ICD-10-CM | POA: Insufficient documentation

## 2020-09-14 DIAGNOSIS — G7281 Critical illness myopathy: Secondary | ICD-10-CM | POA: Diagnosis not present
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
## 2020-09-17 LAB — AEROBIC CULTURE W GRAM STAIN (SUPERFICIAL SPECIMEN)

## 2020-09-18 ENCOUNTER — Encounter: Payer: BC Managed Care – PPO | Admitting: Neurology

## 2020-09-18 NOTE — Progress Notes (Signed)
FORRESTER, BLANDO ([id number]) Visit Report for 08/30/2020 Arrival Information Details Patient Name: Date of Service: Collin Brown, [place] M. 08/30/2020 1:30 PM Medical Record Number: [id number] Patient Account Number: [id number] Date of Birth/Sex: Treating RN: 06/01/1986 (35 y.o. Harlon Flor, Millard.Loa Primary Care Corliss Lamartina: [name] Other Clinician: Referring Caitlen Worth: Treating Kimbree Casanas/Extender: [name], [name] Weeks in Treatment: 2 Visit Information History Since Last Visit Added or deleted any medications: No Patient Arrived: Ambulatory Any new allergies or adverse reactions: No Arrival Time: 13:53 Had a fall or experienced change in No Accompanied By: mother activities of daily living that may affect Transfer Assistance: None risk of falls: Patient Identification Verified: Yes Signs or symptoms of abuse/neglect since last visito No Secondary Verification Process Completed: Yes Hospitalized since last visit: No Patient Requires Transmission-Based Precautions: No Implantable device outside of the clinic excluding No Patient Has Alerts: No cellular tissue based products placed in the center since last visit: Has Dressing in Place as Prescribed: Yes Pain Present Now: No Electronic Signature(s) Signed: 08/30/2020 2:45:22 PM By: Karl Ito Entered By: Karl Ito on 08/30/2020 13:55:33 -------------------------------------------------------------------------------- Encounter Discharge Information Details Patient Name: Date of Service: Collin Calamity, Collin [name] 08/30/2020 1:30 PM Medical Record Number: [id number] Patient Account Number: [id number] Date of Birth/Sex: Treating RN: 01/03/1986 (35 y.o. [name] Primary Care Khayden Herzberg: [name] Other Clinician: Referring Maryclare Nydam: Treating Marchia Diguglielmo/Extender: [name], [name] Weeks in Treatment: 2 Encounter Discharge Information Items Discharge Condition: Stable Ambulatory  Status: Ambulatory Discharge Destination: Home Transportation: Private Auto Accompanied By: mother Schedule Follow-up Appointment: Yes Clinical Summary of Care: Patient Declined Electronic Signature(s) Signed: 09/18/2020 4:02:54 PM By: Zandra Abts RN, BSN Entered By: Zandra Abts on 08/30/2020 15:46:00 -------------------------------------------------------------------------------- Multi Wound Chart Details Patient Name: Date of Service: Collin Calamity, Collin [name] 08/30/2020 1:30 PM Medical Record Number: [id number] Patient Account Number: [id number] Date of Birth/Sex: Treating RN: Jan 24, 1986 (35 y.o. Harlon Flor, Millard.Loa Primary Care Malijah Lietz: [name] Other Clinician: Referring Vonn Sliger: Treating Jalynne Persico/Extender: [name], [name] Weeks in Treatment: 2 Vital Signs Height(in): 70 Pulse(bpm): 118 Weight(lbs): 341 Blood Pressure(mmHg): 138/87 Body Mass Index(BMI): 49 Temperature(F): 97.6 Respiratory Rate(breaths/min): 18 Photos: [1:No Photos Sacrum] [N/A:N/A N/A] Wound Location: [1:Pressure Injury] [N/A:N/A] Wounding Event: [1:Pressure Ulcer] [N/A:N/A] Primary Etiology: [1:Anemia, Sleep Apnea, Hypertension, N/A] Comorbid History: [1:Type II Diabetes 05/18/2020] [N/A:N/A] Date Acquired: [1:2] [N/A:N/A] Weeks of Treatment: [1:Open] [N/A:N/A] Wound Status: [1:1x1.1x4.6] [N/A:N/A] Measurements L x W x D (cm) [1:0.864] [N/A:N/A] A (cm) : rea [1:3.974] [N/A:N/A] Volume (cm) : [1:39.50%] [N/A:N/A] % Reduction in A rea: [1:38.20%] [N/A:N/A] % Reduction in Volume: [1:8] Position 1 (o'clock): [1:4] Maximum Distance 1 (cm): [1:Yes] [N/A:N/A] Tunneling: [1:Category/Stage IV] [N/A:N/A] Classification: [1:Medium] [N/A:N/A] Exudate A mount: [1:Serosanguineous] [N/A:N/A] Exudate Type: [1:red, brown] [N/A:N/A] Exudate Color: [1:Well defined, not attached] [N/A:N/A] Wound Margin: [1:Large (67-100%)] [N/A:N/A] Granulation A mount: [1:Pink] [N/A:N/A] Granulation  Quality: [1:None Present (0%)] [N/A:N/A] Necrotic A mount: [1:Fat Layer (Subcutaneous Tissue): Yes N/A] Exposed Structures: [1:Fascia: No Tendon: No Muscle: No Joint: No Bone: No None] [N/A:N/A] Epithelialization: [1:Negative Pressure Wound Therapy] [N/A:N/A] Procedures Performed: [1:Application (NPWT)] Treatment Notes Electronic Signature(s) Signed: 08/30/2020 4:36:20 PM By: [name] MD Signed: 08/30/2020 4:56:08 PM By: Shawn Stall Entered By: [name] on 08/30/2020 15:31:38 -------------------------------------------------------------------------------- Multi-Disciplinary Care Plan Details Patient Name: Date of Service: Collin Calamity, Collin [name] 08/30/2020 1:30 PM Medical Record Number: [id number] Patient Account Number: [id number] Date of Birth/Sex: Treating RN: Jan 24, 1986 (35 y.o. Collin Brown Primary Care Hermena Swint: [name] Other Clinician: Referring Senon Nixon:  Treating Kumari Sculley/Extender: [name], [name] Weeks in Treatment: 2 Active Inactive Nutrition Nursing Diagnoses: Potential for alteratiion in Nutrition/Potential for imbalanced nutrition Goals: Patient/caregiver agrees to and verbalizes understanding of need to obtain nutritional consultation Date Initiated: 08/16/2020 Target Resolution Date: 09/14/2020 Goal Status: Active Patient/caregiver verbalizes understanding of need to maintain therapeutic glucose control per primary care physician Date Initiated: 08/16/2020 Target Resolution Date: 10/04/2020 Goal Status: Active Interventions: Assess HgA1c results as ordered upon admission and as needed Provide education on elevated blood sugars and impact on wound healing Provide education on nutrition Treatment Activities: Obtain HgA1c : 08/16/2020 Patient referred to Primary Care Physician for further nutritional evaluation : 08/16/2020 Notes: Pressure Nursing Diagnoses: Knowledge deficit related to management of pressures  ulcers Potential for impaired tissue integrity related to pressure, friction, moisture, and shear Goals: Patient will remain free from development of additional pressure ulcers Date Initiated: 08/16/2020 Target Resolution Date: 09/14/2020 Goal Status: Active Patient/caregiver will verbalize understanding of pressure ulcer management Date Initiated: 08/16/2020 Target Resolution Date: 09/15/2020 Goal Status: Active Interventions: Assess: immobility, friction, shearing, incontinence upon admission and as needed Assess potential for pressure ulcer upon admission and as needed Provide education on pressure ulcers Treatment Activities: Consult for HBO : 08/16/2020 Pressure reduction/relief device ordered : 08/16/2020 T ordered outside of clinic : 08/16/2020 est Notes: Wound/Skin Impairment Nursing Diagnoses: Knowledge deficit related to ulceration/compromised skin integrity Goals: Patient/caregiver will verbalize understanding of skin care regimen Date Initiated: 08/16/2020 Target Resolution Date: 09/14/2020 Goal Status: Active Interventions: Assess patient/caregiver ability to perform ulcer/skin care regimen upon admission and as needed Assess ulceration(s) every visit Provide education on ulcer and skin care Treatment Activities: Skin care regimen initiated : 08/16/2020 Topical wound management initiated : 08/16/2020 Notes: Electronic Signature(s) Signed: 08/30/2020 4:56:08 PM By: Shawn Stall Entered By: Shawn Stall on 08/30/2020 13:59:50 -------------------------------------------------------------------------------- Negative Pressure Wound Therapy Application (NPWT) Details Patient Name: Date of Service: Collin Brown, [place] M. 08/30/2020 1:30 PM Medical Record Number: [id number] Patient Account Number: [id number] Date of Birth/Sex: Treating RN: 1986/04/05 (35 y.o. Collin Brown Primary Care Royce Stegman: [name] Other Clinician: Referring Tomy Khim: Treating Quamesha Mullet/Extender: [name], [name] Weeks in Treatment: 2 NPWT Application Performed for: Wound #1 Sacrum Performed By: Zandra Abts, RN Type: VAC System Coverage Size (sq cm): 1.1 Pressure Type: Constant Pressure Setting: 125 mmHG Drain Type: None Primary Contact: Non-Adherent Quantity of Sponges/Gauze Inserted: x1 black foam bridged to right hip. Sponge/Dressing Type: Foam, Black Date Initiated: 08/20/2020 Response to Treatment: tolerated well Post Procedure Diagnosis Same as Pre-procedure Electronic Signature(s) Signed: 08/30/2020 4:56:08 PM By: Shawn Stall Entered By: Shawn Stall on 08/30/2020 14:47:03 -------------------------------------------------------------------------------- Non-Wound Condition Assessment Details Patient Name: Date of Service: Collin Calamity, Collin [name] 08/30/2020 1:30 PM Medical Record Number: [id number] Patient Account Number: [id number] Date of Birth/Sex: Treating RN: April 08, 1986 (35 y.o. Collin Brown Primary Care Kerston Landeck: [name] Other Clinician: Referring Aleshia Cartelli: Treating Conroy Goracke/Extender: [name], [name] Weeks in Treatment: 2 Non-Wound Condition: Condition: Rash / Dermatitis Location: Arm Side: Left Notes: axillary area. Notes large amount of moisture, redness, odor, and rash like area noted under the axillary area. Electronic Signature(s) Signed: 08/30/2020 4:56:08 PM By: Shawn Stall Entered By: Shawn Stall on 08/30/2020 14:48:06 -------------------------------------------------------------------------------- Pain Assessment Details Patient Name: Date of Service: Collin Calamity, Collin [name] 08/30/2020 1:30 PM Medical Record Number: [id number] Patient Account Number: [id number] Date of Birth/Sex: Treating RN: 1986/04/02 (35 y.o. Collin Brown Primary Care Jaheem Hedgepath: [name] Other Clinician: Referring Cathan Gearin: Treating Danetra Glock/Extender: [name]  [name], [name] Weeks in Treatment: 2 Active  Problems Location of Pain Severity and Description of Pain Patient Has Paino No Site Locations Pain Management and Medication Current Pain Management: Electronic Signature(s) Signed: 08/30/2020 2:45:22 PM By: Karl Ito Signed: 08/30/2020 4:56:08 PM By: Shawn Stall Entered By: Karl Ito on 08/30/2020 13:56:09 -------------------------------------------------------------------------------- Patient/Caregiver Education Details Patient Name: Date of Service: Collin Calamity, Collin TTHEW Judie Petit 1/20/2022andnbsp1:30 PM Medical Record Number: [id number] Patient Account Number: [id number] Date of Birth/Gender: Treating RN: 1986/01/12 (35 y.o. Collin Brown Primary Care Physician: [name] Other Clinician: Referring Physician: Treating Physician/Extender: [name], [name] Weeks in Treatment: 2 Education Assessment Education Provided To: Patient Education Topics Provided Elevated Blood Sugar/ Impact on Healing: Handouts: Elevated Blood Sugars: How Do They Affect Wound Healing Methods: Explain/Verbal Responses: Reinforcements needed Electronic Signature(s) Signed: 08/30/2020 4:56:08 PM By: Shawn Stall Entered By: Shawn Stall on 08/30/2020 14:00:00 -------------------------------------------------------------------------------- Wound Assessment Details Patient Name: Date of Service: Collin Calamity, Collin [name] 08/30/2020 1:30 PM Medical Record Number: [id number] Patient Account Number: [id number] Date of Birth/Sex: Treating RN: May 13, 1986 (35 y.o. Harlon Flor, Millard.Loa Primary Care Bethann Qualley: [name] Other Clinician: Referring Fatim Vanderschaaf: Treating Dayelin Balducci/Extender: [name], [name] Weeks in Treatment: 2 Wound Status Wound Number: 1 Primary Etiology: Pressure Ulcer Wound Location: Sacrum Wound Status: Open Wounding Event: Pressure Injury Comorbid History: Anemia, Sleep Apnea, Hypertension, Type II Diabetes Date Acquired:  05/18/2020 Weeks Of Treatment: 2 Clustered Wound: No Photos Photo Uploaded By: Benjaman Kindler on 09/04/2020 11:41:19 Wound Measurements Length: (cm) 1 Width: (cm) 1.1 Depth: (cm) 4.6 Area: (cm) 0.864 Volume: (cm) 3.974 % Reduction in Area: 39.5% % Reduction in Volume: 38.2% Epithelialization: None Tunneling: Yes Position (o'clock): 8 Maximum Distance: (cm) 4 Undermining: No Wound Description Classification: Category/Stage IV Wound Margin: Well defined, not attached Exudate Amount: Medium Exudate Type: Serosanguineous Exudate Color: red, brown Foul Odor After Cleansing: No Slough/Fibrino No Wound Bed Granulation Amount: Large (67-100%) Exposed Structure Granulation Quality: Pink Fascia Exposed: No Necrotic Amount: None Present (0%) Fat Layer (Subcutaneous Tissue) Exposed: Yes Tendon Exposed: No Muscle Exposed: No Joint Exposed: No Bone Exposed: No Electronic Signature(s) Signed: 08/30/2020 4:56:08 PM By: Shawn Stall Signed: 09/18/2020 4:02:54 PM By: Zandra Abts RN, BSN Entered By: Zandra Abts on 08/30/2020 14:24:58 -------------------------------------------------------------------------------- Vitals Details Patient Name: Date of Service: Collin Calamity, Collin [name] 08/30/2020 1:30 PM Medical Record Number: [id number] Patient Account Number: [id number] Date of Birth/Sex: Treating RN: [birth year] (35 y.o. Collin Brown Primary Care Oksana Deberry: [name] Other Clinician: Referring Kelisha Dall: Treating Breeana Sawtelle/Extender: [name], [name] Weeks in Treatment: 2 Vital Signs Time Taken: 13:55 Temperature (F): 97.6 Height (in): 70 Pulse (bpm): 118 Weight (lbs): 341 Respiratory Rate (breaths/min): 18 Body Mass Index (BMI): 48.9 Blood Pressure (mmHg): 138/87 Reference Range: 80 - 120 mg / dl Electronic Signature(s) Signed: 08/30/2020 2:45:22 PM By: Karl Ito Entered By: Karl Ito on 08/30/2020 13:55:52

## 2020-09-20 ENCOUNTER — Ambulatory Visit
Admission: RE | Admit: 2020-09-20 | Discharge: 2020-09-20 | Disposition: A | Discharge status: Home / Self Care | Payer: BC Managed Care – PPO | Source: Ambulatory Visit | Attending: Internal Medicine | Admitting: Internal Medicine

## 2020-09-20 ENCOUNTER — Other Ambulatory Visit: Payer: Self-pay | Admitting: Internal Medicine

## 2020-09-20 ENCOUNTER — Other Ambulatory Visit: Payer: Self-pay

## 2020-09-20 DIAGNOSIS — L89154 Pressure ulcer of sacral region, stage 4: Secondary | ICD-10-CM | POA: Diagnosis not present

## 2020-09-20 IMAGING — CR DG SACRUM/COCCYX 2+V
1 series · 3 of 3 positions shown · non-contrast
Comparison: None.

CLINICAL DATA: Stage IV pressure ulcer

EXAM:
SACRUM AND COCCYX - 2+ VIEW

[Series 1: dg sacrum/coccyx · 0.14mm/px · 3 of 3 slices shown]
[im 1/3]
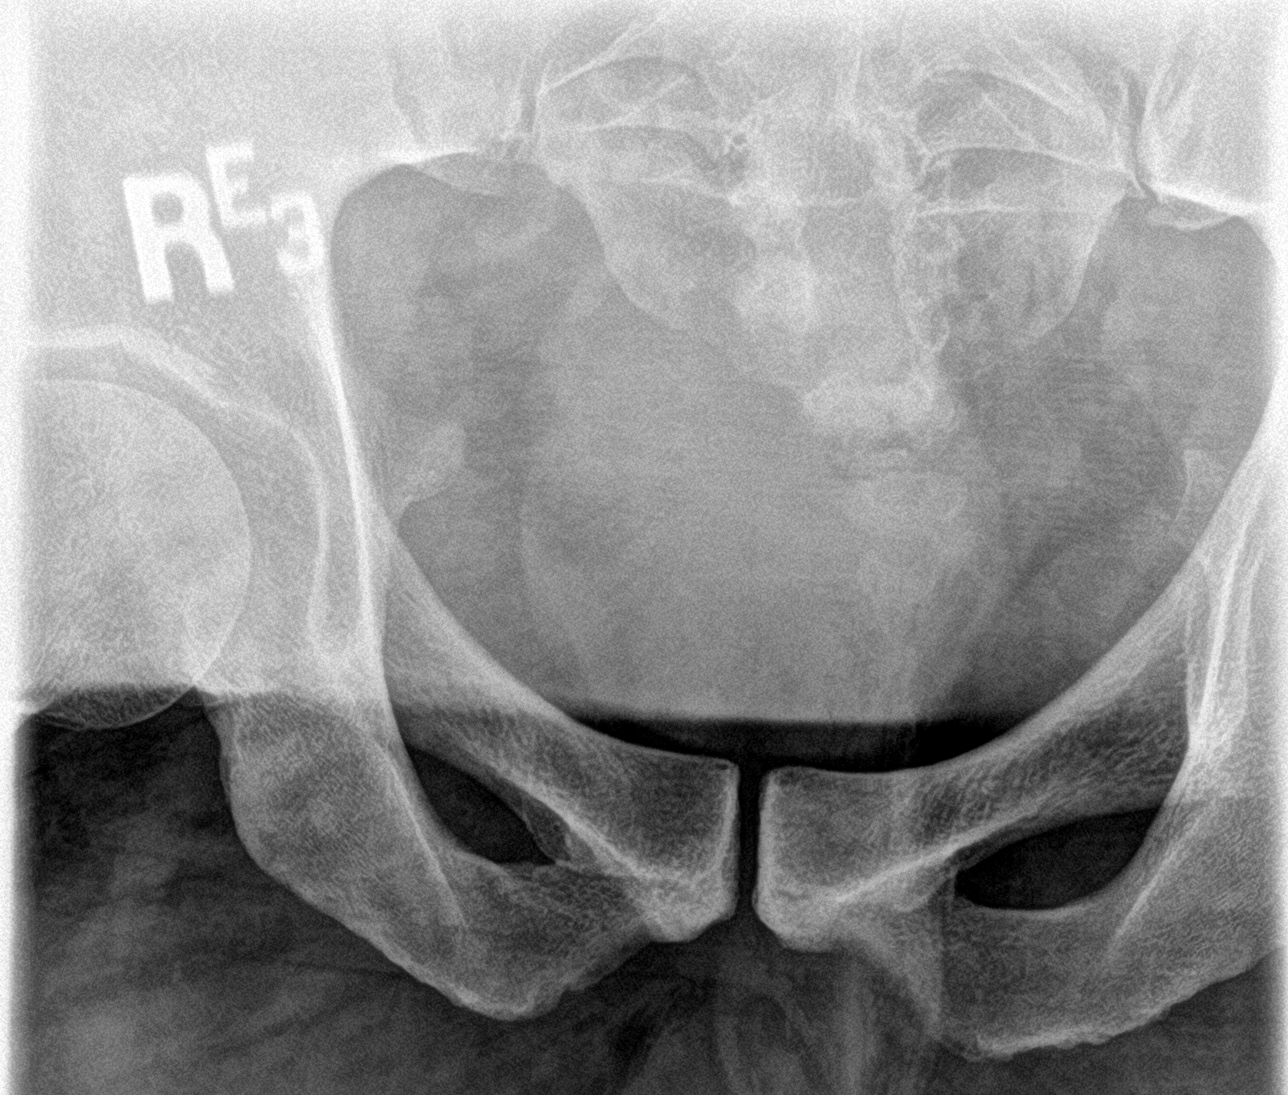
[im 2/3]
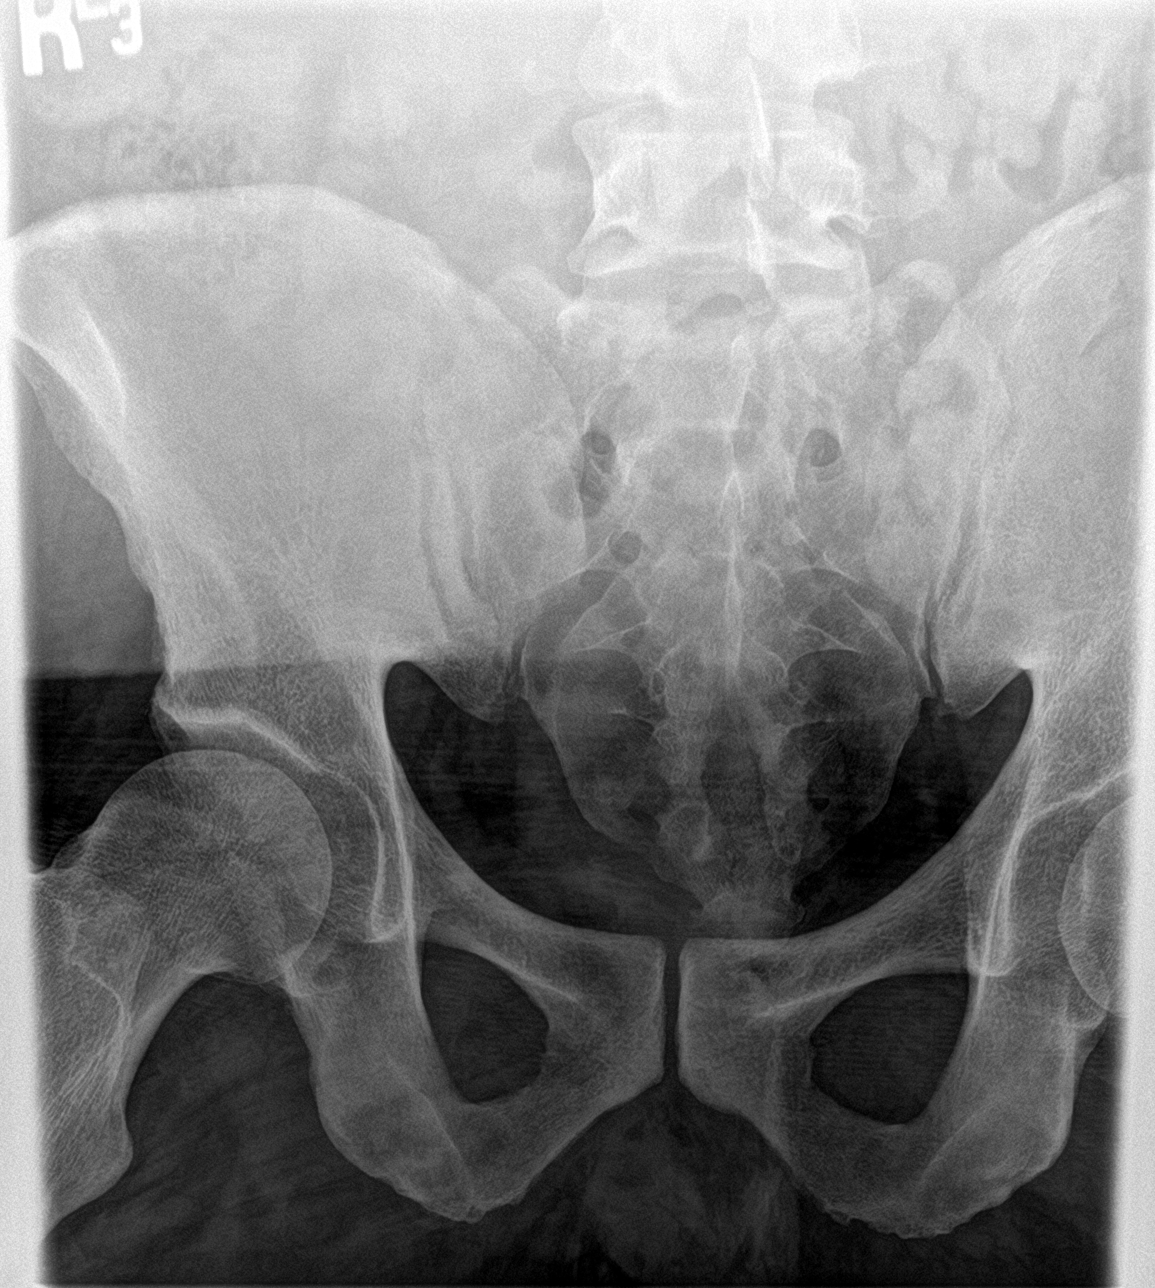
[im 3/3]
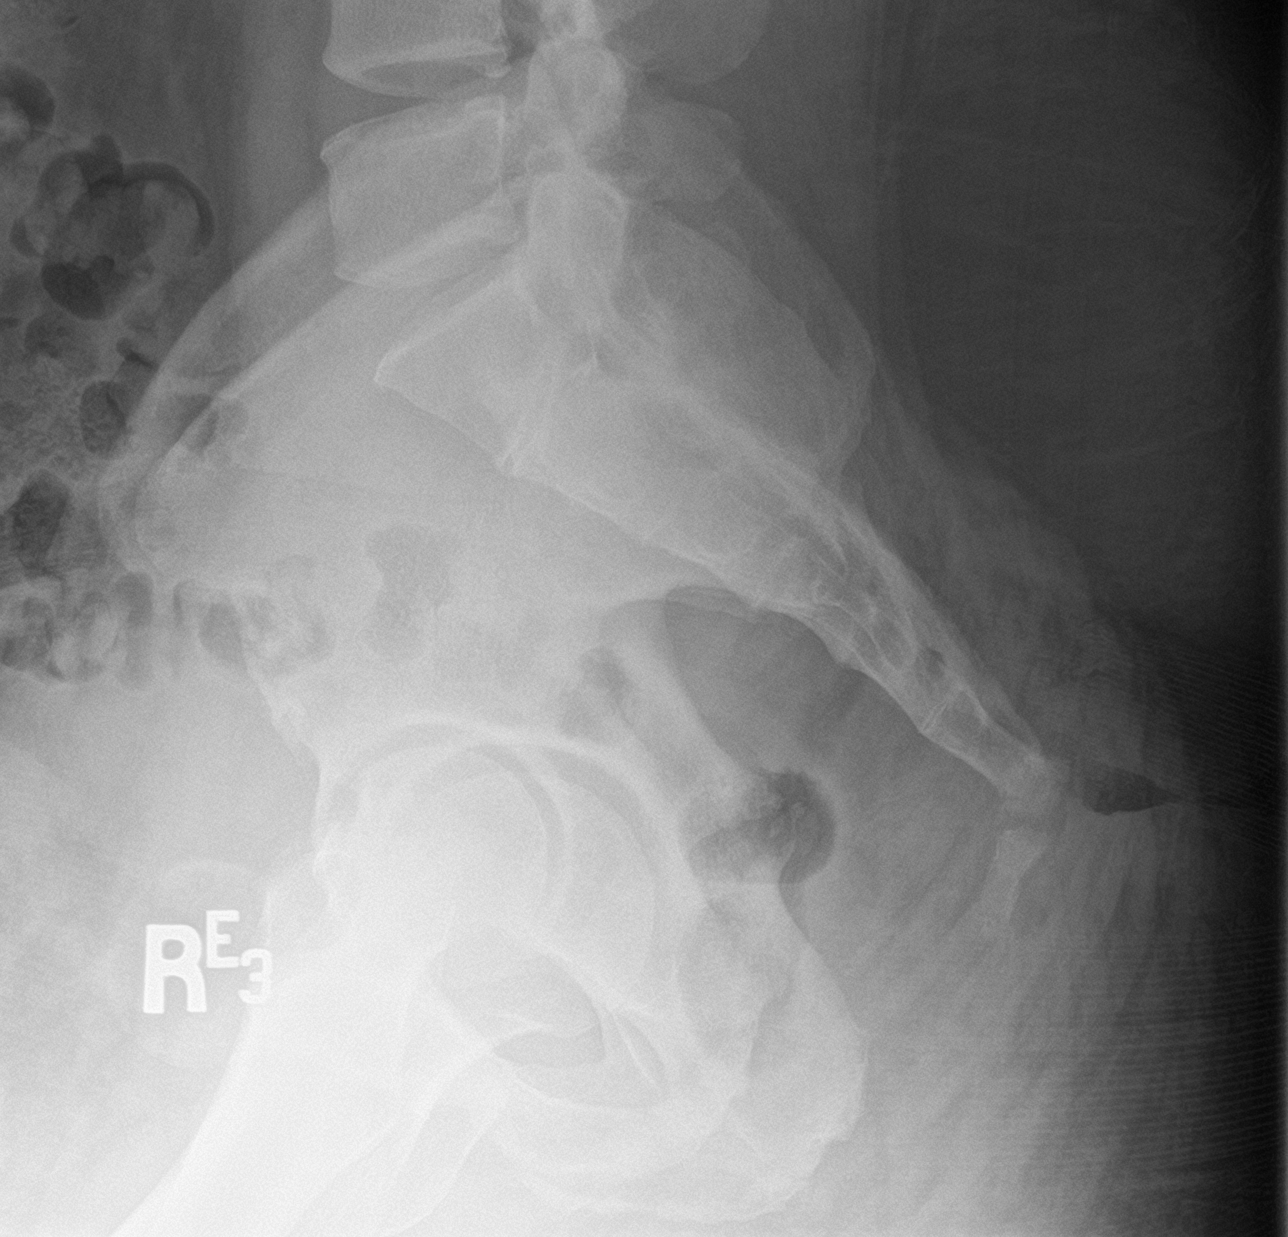

[3 of 3 positions shown; findings below may reference images not displayed]

FINDINGS: Soft tissue thickening and gas is seen extending to the surface of
the sacrococcygeal junction with some irregular periosteal
mineralization and lucency between the segments, concerning for
developing osteomyelitis at the base of ulceration. No other acute
or conspicuous osseous or soft tissue abnormalities. Minimal
degenerative changes in the spine.
IMPRESSION: Soft tissue thickening and gas extending to the surface of the
sacrococcygeal junction with some irregular periosteal
mineralization and lucency between the segments, concerning for
developing osteomyelitis at the base of decubitus ulceration.
Consider further evaluation with contrast enhanced MR imaging as
clinically warranted.

## 2020-09-21 ENCOUNTER — Other Ambulatory Visit (HOSPITAL_COMMUNITY): Payer: Self-pay | Admitting: Internal Medicine

## 2020-09-21 ENCOUNTER — Encounter (HOSPITAL_BASED_OUTPATIENT_CLINIC_OR_DEPARTMENT_OTHER): Payer: BC Managed Care – PPO | Admitting: Internal Medicine

## 2020-09-21 DIAGNOSIS — E11622 Type 2 diabetes mellitus with other skin ulcer: Secondary | ICD-10-CM | POA: Diagnosis not present

## 2020-09-21 DIAGNOSIS — L89154 Pressure ulcer of sacral region, stage 4: Secondary | ICD-10-CM
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
## 2020-09-24 NOTE — Progress Notes (Signed)
Brown, Collin ([id number]) Visit Report for 09/21/2020 Arrival Information Details Patient Name: Date of Service: Collin Brown, [place] M. 09/21/2020 1:30 PM Medical Record Number: [id number] Patient Account Number: [id number] Date of Birth/Sex: Treating RN: 04-12-1986 (35 y.o. Collin Brown, [name] Primary Care Kristiana Jacko: Laverna Peace Other Clinician: Referring Rhen Dossantos: Treating Corine Solorio/Extender: Hessie Dibble, [name] Weeks in Treatment: 5 Visit Information History Since Last Visit Added or deleted any medications: No Patient Arrived: Ambulatory Any new allergies or adverse reactions: No Arrival Time: 14:08 Had a fall or experienced change in No Accompanied By: mother activities of daily living that may affect Transfer Assistance: None risk of falls: Patient Identification Verified: Yes Signs or symptoms of abuse/neglect since last visito No Secondary Verification Process Completed: Yes Hospitalized since last visit: No Patient Requires Transmission-Based Precautions: No Implantable device outside of the clinic excluding No Patient Has Alerts: No cellular tissue based products placed in the center since last visit: Has Dressing in Place as Prescribed: Yes Brown Present Now: No Electronic Signature(s) Signed: 09/24/2020 10:55:04 AM By: Sandre Kitty Entered By: Sandre Kitty on 09/21/2020 14:09:00 -------------------------------------------------------------------------------- Clinic Level of Care Assessment Details Patient Name: Date of Service: Collin Brown, [place] M. 09/21/2020 1:30 PM Medical Record Number: [id number] Patient Account Number: [id number] Date of Birth/Sex: Treating RN: 02/24/1986 (35 y.o. Collin Brown Primary Care Mackenzie Lia: Laverna Peace Other Clinician: Referring Hussien Greenblatt: Treating Infantof Villagomez/Extender: Hessie Dibble, [name] Weeks in Treatment: 5 Clinic Level of Care Assessment Items TOOL 4 Quantity Score '[]'  - 0 Use  when only an EandM is performed on FOLLOW-UP visit ASSESSMENTS - Nursing Assessment / Reassessment X- 1 10 Reassessment of Co-morbidities (includes updates in patient status) X- 1 5 Reassessment of Adherence to Treatment Plan ASSESSMENTS - Wound and Skin A ssessment / Reassessment X - Simple Wound Assessment / Reassessment - one wound 1 5 '[]'  - 0 Complex Wound Assessment / Reassessment - multiple wounds '[]'  - 0 Dermatologic / Skin Assessment (not related to wound area) ASSESSMENTS - Focused Assessment '[]'  - 0 Circumferential Edema Measurements - multi extremities '[]'  - 0 Nutritional Assessment / Counseling / Intervention '[]'  - 0 Lower Extremity Assessment (monofilament, tuning fork, pulses) '[]'  - 0 Peripheral Arterial Disease Assessment (using hand held doppler) ASSESSMENTS - Ostomy and/or Continence Assessment and Care '[]'  - 0 Incontinence Assessment and Management '[]'  - 0 Ostomy Care Assessment and Management (repouching, etc.) PROCESS - Coordination of Care X - Simple Patient / Family Education for ongoing care 1 15 '[]'  - 0 Complex (extensive) Patient / Family Education for ongoing care X- 1 10 Staff obtains Programmer, systems, Records, T Results / Process Orders est X- 1 10 Staff telephones HHA, Nursing Homes / Clarify orders / etc '[]'  - 0 Routine Transfer to another Facility (non-emergent condition) '[]'  - 0 Routine Hospital Admission (non-emergent condition) '[]'  - 0 New Admissions / Biomedical engineer / Ordering NPWT Apligraf, etc. , '[]'  - 0 Emergency Hospital Admission (emergent condition) X- 1 10 Simple Discharge Coordination '[]'  - 0 Complex (extensive) Discharge Coordination PROCESS - Special Needs '[]'  - 0 Pediatric / Minor Patient Management '[]'  - 0 Isolation Patient Management '[]'  - 0 Hearing / Language / Visual special needs '[]'  - 0 Assessment of Community assistance (transportation, D/C planning, etc.) '[]'  - 0 Additional assistance / Altered mentation '[]'  - 0 Support  Surface(s) Assessment (bed, cushion, seat, etc.) INTERVENTIONS - Wound Cleansing / Measurement X - Simple Wound Cleansing - one wound 1 5 '[]'  - 0 Complex Wound Cleansing - multiple wounds  X- 1 5 Wound Imaging (photographs - any number of wounds) '[]'  - 0 Wound Tracing (instead of photographs) X- 1 5 Simple Wound Measurement - one wound '[]'  - 0 Complex Wound Measurement - multiple wounds INTERVENTIONS - Wound Dressings X - Small Wound Dressing one or multiple wounds 1 10 '[]'  - 0 Medium Wound Dressing one or multiple wounds '[]'  - 0 Large Wound Dressing one or multiple wounds X- 1 5 Application of Medications - topical '[]'  - 0 Application of Medications - injection INTERVENTIONS - Miscellaneous '[]'  - 0 External ear exam '[]'  - 0 Specimen Collection (cultures, biopsies, blood, body fluids, etc.) '[]'  - 0 Specimen(s) / Culture(s) sent or taken to Lab for analysis '[]'  - 0 Patient Transfer (multiple staff / Civil Service fast streamer / Similar devices) '[]'  - 0 Simple Staple / Suture removal (25 or less) '[]'  - 0 Complex Staple / Suture removal (26 or more) '[]'  - 0 Hypo / Hyperglycemic Management (close monitor of Blood Glucose) '[]'  - 0 Ankle / Brachial Index (ABI) - do not check if billed separately X- 1 5 Vital Signs Has the patient been seen at the hospital within the last three years: Yes Total Score: 100 Level Of Care: New/Established - Level 3 Electronic Signature(s) Signed: 09/21/2020 5:49:38 PM By: Baruch Gouty RN, BSN Entered By: Baruch Gouty on 09/21/2020 14:57:20 -------------------------------------------------------------------------------- Encounter Discharge Information Details Patient Name: Date of Service: Collin Pain, [name] 09/21/2020 1:30 PM Medical Record Number: [id number] Patient Account Number: [id number] Date of Birth/Sex: Treating RN: 08-04-1986 (35 y.o. [name] Primary Care Deshannon Seide: Laverna Peace Other Clinician: Referring Calin Ellery: Treating  Boysie Bonebrake/Extender: Hessie Dibble, [name] Weeks in Treatment: 5 Encounter Discharge Information Items Discharge Condition: Stable Ambulatory Status: Ambulatory Discharge Destination: Home Transportation: Private Auto Accompanied By: mother Schedule Follow-up Appointment: Yes Clinical Summary of Care: Electronic Signature(s) Signed: 09/21/2020 5:46:28 PM By: Deon Pilling Entered By: Deon Pilling on 09/21/2020 15:43:53 -------------------------------------------------------------------------------- Lower Extremity Assessment Details Patient Name: Date of Service: Collin Brown, [place] [name] 09/21/2020 1:30 PM Medical Record Number: [id number] Patient Account Number: [id number] Date of Birth/Sex: Treating RN: 1986-07-30 (35 y.o. [name] Primary Care Naturi Alarid: Laverna Peace Other Clinician: Referring Faith Branan: Treating Leandria Thier/Extender: Hessie Dibble, [name] Weeks in Treatment: 5 Electronic Signature(s) Signed: 09/21/2020 5:46:28 PM By: Deon Pilling Entered By: Deon Pilling on 09/21/2020 14:29:46 -------------------------------------------------------------------------------- Multi Wound Chart Details Patient Name: Date of Service: Collin Pain, [name] 09/21/2020 1:30 PM Medical Record Number: [id number] Patient Account Number: [id number] Date of Birth/Sex: Treating RN: 1985-09-20 (35 y.o. Collin Brown Primary Care Corene Resnick: Laverna Peace Other Clinician: Referring Keeanna Villafranca: Treating Kaveh Kissinger/Extender: Hessie Dibble, [name] Weeks in Treatment: 5 Vital Signs Height(in): 70 Pulse(bpm): 87 Weight(lbs): 341 Blood Pressure(mmHg): 119/70 Body Mass Index(BMI): 49 Temperature(F): 97.8 Respiratory Rate(breaths/min): 16 Photos: [1:No Photos Sacrum] [N/A:N/A N/A] Wound Location: [1:Pressure Injury] [N/A:N/A] Wounding Event: [1:Pressure Ulcer] [N/A:N/A] Primary Etiology: [1:Anemia, Sleep Apnea, Hypertension, N/A] Comorbid  History: [1:Type II Diabetes 05/18/2020] [N/A:N/A] Date Acquired: [1:5] [N/A:N/A] Weeks of Treatment: [1:Open] [N/A:N/A] Wound Status: [1:2x2.3x5.7] [N/A:N/A] Measurements L x W x D (cm) [1:3.613] [N/A:N/A] A (cm) : rea [1:20.593] [N/A:N/A] Volume (cm) : [1:-152.80%] [N/A:N/A] % Reduction in A rea: [1:-220.20%] [N/A:N/A] % Reduction in Volume: [1:Category/Stage IV] [N/A:N/A] Classification: [1:Large] [N/A:N/A] Exudate A mount: [1:Purulent] [N/A:N/A] Exudate Type: [1:yellow, brown, green] [N/A:N/A] Exudate Color: [1:Well defined, not attached] [N/A:N/A] Wound Margin: [1:Large (67-100%)] [N/A:N/A] Granulation A mount: [1:Pink] [N/A:N/A] Granulation Quality: [1:None Present (0%)] [N/A:N/A] Necrotic A mount: [1:Fat Layer (Subcutaneous Tissue):  Yes N/A] Exposed Structures: [1:Fascia: No Tendon: No Muscle: No Joint: No Bone: No None] [N/A:N/A] Treatment Notes Electronic Signature(s) Signed: 09/21/2020 5:36:32 PM By: Linton Ham MD Signed: 09/21/2020 5:49:38 PM By: Baruch Gouty RN, BSN Entered By: Linton Ham on 09/21/2020 15:05:19 -------------------------------------------------------------------------------- Multi-Disciplinary Care Plan Details Patient Name: Date of Service: Collin Pain, [name] 09/21/2020 1:30 PM Medical Record Number: [id number] Patient Account Number: [id number] Date of Birth/Sex: Treating RN: Oct 08, 1985 (35 y.o. Collin Brown Primary Care Jere Vanburen: Laverna Peace Other Clinician: Referring Latandra Loureiro: Treating Deondrae Mcgrail/Extender: Hessie Dibble, [name] Weeks in Treatment: 5 Active Inactive Nutrition Nursing Diagnoses: Potential for alteratiion in Nutrition/Potential for imbalanced nutrition Goals: Patient/caregiver agrees to and verbalizes understanding of need to obtain nutritional consultation Date Initiated: 08/16/2020 Date Inactivated: 09/14/2020 Target Resolution Date: 09/14/2020 Goal Status: Met Patient/caregiver verbalizes  understanding of need to maintain therapeutic glucose control per primary care physician Date Initiated: 08/16/2020 Target Resolution Date: 10/04/2020 Goal Status: Active Interventions: Assess HgA1c results as ordered upon admission and as needed Provide education on elevated blood sugars and impact on wound healing Provide education on nutrition Treatment Activities: Obtain HgA1c : 08/16/2020 Patient referred to Primary Care Physician for further nutritional evaluation : 08/16/2020 Notes: Pressure Nursing Diagnoses: Knowledge deficit related to management of pressures ulcers Potential for impaired tissue integrity related to pressure, friction, moisture, and shear Goals: Patient will remain free from development of additional pressure ulcers Date Initiated: 08/16/2020 Date Inactivated: 09/14/2020 Target Resolution Date: 09/14/2020 Goal Status: Met Patient/caregiver will verbalize understanding of pressure ulcer management Date Initiated: 08/16/2020 Target Resolution Date: 10/12/2020 Goal Status: Active Interventions: Assess: immobility, friction, shearing, incontinence upon admission and as needed Assess potential for pressure ulcer upon admission and as needed Provide education on pressure ulcers Treatment Activities: Consult for HBO : 08/16/2020 Pressure reduction/relief device ordered : 08/16/2020 T ordered outside of clinic : 08/16/2020 est Notes: Wound/Skin Impairment Nursing Diagnoses: Knowledge deficit related to ulceration/compromised skin integrity Goals: Patient/caregiver will verbalize understanding of skin care regimen Date Initiated: 08/16/2020 Target Resolution Date: 10/12/2020 Goal Status: Active Interventions: Assess patient/caregiver ability to perform ulcer/skin care regimen upon admission and as needed Assess ulceration(s) every visit Provide education on ulcer and skin care Treatment Activities: Skin care regimen initiated : 08/16/2020 Topical wound management initiated :  08/16/2020 Notes: Electronic Signature(s) Signed: 09/21/2020 5:49:38 PM By: Baruch Gouty RN, BSN Entered By: Baruch Gouty on 09/21/2020 14:44:32 -------------------------------------------------------------------------------- Brown Assessment Details Patient Name: Date of Service: Collin Pain, [name] 09/21/2020 1:30 PM Medical Record Number: [id number] Patient Account Number: [id number] Date of Birth/Sex: Treating RN: Jun 28, 1986 (35 y.o. Collin Brown Primary Care Albirta Rhinehart: Laverna Peace Other Clinician: Referring Dene Nazir: Treating Delani Kohli/Extender: Hessie Dibble, [name] Weeks in Treatment: 5 Active Problems Location of Brown Severity and Description of Brown Patient Has Paino Yes Site Locations Rate the Brown. Current Brown Level: 4 Brown Management and Medication Current Brown Management: Electronic Signature(s) Signed: 09/21/2020 5:49:38 PM By: Baruch Gouty RN, BSN Signed: 09/24/2020 10:55:04 AM By: Sandre Kitty Entered By: Sandre Kitty on 09/21/2020 14:11:03 -------------------------------------------------------------------------------- Patient/Caregiver Education Details Patient Name: Date of Service: Collin Pain, MA TTHEW Jerilynn Mages 2/11/2022andnbsp1:30 PM Medical Record Number: [id number] Patient Account Number: [id number] Date of Birth/Gender: Treating RN: 1986/08/09 (35 y.o. Collin Brown Primary Care Physician: Laverna Peace Other Clinician: Referring Physician: Treating Physician/Extender: Hessie Dibble, [name] Weeks in Treatment: 5 Education Assessment Education Provided To: Patient Education Topics Provided Elevated Blood Sugar/ Impact on Healing: Methods: Explain/Verbal Responses: Reinforcements needed, State content correctly Infection: Methods:  Explain/Verbal Responses: Reinforcements needed, State content correctly Pressure: Methods: Explain/Verbal Responses: Reinforcements needed, State content  correctly Wound/Skin Impairment: Methods: Explain/Verbal Responses: Reinforcements needed, State content correctly Electronic Signature(s) Signed: 09/21/2020 5:49:38 PM By: Baruch Gouty RN, BSN Entered By: Baruch Gouty on 09/21/2020 14:45:05 -------------------------------------------------------------------------------- Wound Assessment Details Patient Name: Date of Service: Collin Pain, [name] 09/21/2020 1:30 PM Medical Record Number: [id number] Patient Account Number: [id number] Date of Birth/Sex: Treating RN: 1985/11/29 (35 y.o. Lorette Ang, Meta.Reding Primary Care Achilles Neville: Laverna Peace Other Clinician: Referring Emmanuela Ghazi: Treating Maybel Dambrosio/Extender: Hessie Dibble, [name] Weeks in Treatment: 5 Wound Status Wound Number: 1 Primary Etiology: Pressure Ulcer Wound Location: Sacrum Wound Status: Open Wounding Event: Pressure Injury Comorbid History: Anemia, Sleep Apnea, Hypertension, Type II Diabetes Date Acquired: 05/18/2020 Weeks Of Treatment: 5 Clustered Wound: No Wound Measurements Length: (cm) 2 Width: (cm) 2.3 Depth: (cm) 5.7 Area: (cm) 3.613 Volume: (cm) 20.593 % Reduction in Area: -152.8% % Reduction in Volume: -220.2% Epithelialization: None Tunneling: No Undermining: No Wound Description Classification: Category/Stage IV Wound Margin: Well defined, not attached Exudate Amount: Large Exudate Type: Purulent Exudate Color: yellow, brown, green Foul Odor After Cleansing: No Slough/Fibrino No Wound Bed Granulation Amount: Large (67-100%) Exposed Structure Granulation Quality: Pink Fascia Exposed: No Necrotic Amount: None Present (0%) Fat Layer (Subcutaneous Tissue) Exposed: Yes Tendon Exposed: No Muscle Exposed: No Joint Exposed: No Bone Exposed: No Treatment Notes Wound #1 (Sacrum) Cleanser Normal Saline Discharge Instruction: Cleanse the wound with Normal Saline prior to applying a clean dressing using gauze sponges, not tissue or  cotton balls. Normal Saline Discharge Instruction: Cleanse the wound with Normal Saline prior to applying a clean dressing using gauze sponges, not tissue or cotton balls. Peri-Wound Care Skin Prep Discharge Instruction: Use skin prep as directed Topical Primary Dressing KerraCel Ag Gelling Fiber Dressing, 4x5 in (silver alginate) Discharge Instruction: Apply silver alginate packed lightly into wound Secondary Dressing ComfortFoam Border, 6x6 in (silicone border) Discharge Instruction: Apply over primary dressing as directed. Secured With Compression Wrap Compression Stockings Add-Ons Notes educated patient and mother about someone will call regarding the MRI. Both in agreement. Electronic Signature(s) Signed: 09/21/2020 5:46:28 PM By: Deon Pilling Entered By: Deon Pilling on 09/21/2020 14:29:09 -------------------------------------------------------------------------------- Vitals Details Patient Name: Date of Service: Collin Pain, [name] 09/21/2020 1:30 PM Medical Record Number: [id number] Patient Account Number: [id number] Date of Birth/Sex: Treating RN: 04/08/1986 (35 y.o. Collin Brown Primary Care Jaicob Dia: Laverna Peace Other Clinician: Referring Suriyah Vergara: Treating Sylas Twombly/Extender: Hessie Dibble, [name] Weeks in Treatment: 5 Vital Signs Time Taken: 14:09 Temperature (F): 97.8 Height (in): 70 Pulse (bpm): 87 Weight (lbs): 341 Respiratory Rate (breaths/min): 16 Body Mass Index (BMI): 48.9 Blood Pressure (mmHg): 119/70 Reference Range: 80 - 120 mg / dl Electronic Signature(s) Signed: 09/24/2020 10:55:04 AM By: Sandre Kitty Entered By: Sandre Kitty on 09/21/2020 14:10:39

## 2020-09-25 ENCOUNTER — Other Ambulatory Visit: Payer: Self-pay

## 2020-09-25 ENCOUNTER — Ambulatory Visit (INDEPENDENT_AMBULATORY_CARE_PROVIDER_SITE_OTHER): Payer: BC Managed Care – PPO | Admitting: Pulmonary Disease

## 2020-09-25 ENCOUNTER — Encounter: Payer: Self-pay | Admitting: Pulmonary Disease

## 2020-09-25 VITALS — BP 126/74 | HR 87 | Temp 97.8°F | Ht 70.0 in | Wt 367.0 lb

## 2020-09-25 DIAGNOSIS — J8410 Pulmonary fibrosis, unspecified: Secondary | ICD-10-CM

## 2020-09-25 DIAGNOSIS — G7281 Critical illness myopathy: Secondary | ICD-10-CM

## 2020-09-25 DIAGNOSIS — G540 Brachial plexus disorders: Secondary | ICD-10-CM | POA: Diagnosis not present

## 2020-09-25 DIAGNOSIS — G4733 Obstructive sleep apnea (adult) (pediatric): Secondary | ICD-10-CM

## 2020-09-25 DIAGNOSIS — Z8616 Personal history of COVID-19: Secondary | ICD-10-CM

## 2020-09-25 DIAGNOSIS — L89154 Pressure ulcer of sacral region, stage 4: Secondary | ICD-10-CM

## 2020-09-25 DIAGNOSIS — Z6841 Body Mass Index (BMI) 40.0 and over, adult: Secondary | ICD-10-CM

## 2020-09-25 NOTE — Patient Instructions (Signed)
It seems that your breathing has improved with your muscle strength.  Your oxygen level today was excellent your lungs sounded clear.  I agree with doing a sleep study.   Continue your hard work and getting better.  I am glad you are going to be getting occupational therapy.   We will see you as needed if your breathing issues worsen.

## 2020-09-25 NOTE — Progress Notes (Signed)
Subjective:    Patient ID: Collin Brown, male    DOB: [birth year], 35 y.o.   MRN: [id number]  Chief Complaint  Patient presents with  . Follow-up    Hx of covid- required admission. No current sx.    HPI The patient is a 35 year old lifelong never smoker who presents after hospitalization in September 2021 with COVID-19 pneumonia and ARDS.  I actually evaluated this patient on 05 May 2020 when he was in severe respiratory distress and required endotracheal intubation and mechanical ventilation.  Please see my consult note in Epic with regards to that episode.  The patient had a very severe case of COVID-19 admitted from 03 May 2020 through 08 June 2020.  He developed issues with critical illness myopathy, left shoulder impingement syndrome, chronic respiratory failure and sacral pressure ulcers.  The patient had a high likelihood of mortality due to his obesity and underlying diabetes mellitus.  The patient presents today for follow-up.  He required oxygen for a period of time but now is off oxygen.  From the pulmonary standpoint he is doing well.  Recent chest x-ray from 17 August 2020 shows some remainder peripheral opacities in the right lung and left base these are mild and likely residual from post COVID-pneumonia.  He does not endorse shortness of breath, cough, sputum production or hemoptysis.  No chest pain.  No lower extremity edema or calf tenderness.  He actually wants to go back to work.  He has noted that his shortness of breath has improved with his increased muscle strength which has taken time to come back due to his critical illness myopathy.  He has a deep sacral wound that is being treated by the wound clinic.  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  He is being followed by infectious disease.  He does need evaluation for obstructive sleep apnea as he was in the process of getting this when he became ill with COVID.  In reviewing his primary  practitioner's notes it appears that the question is with regards to these opacities.  At this point all that can be done is to follow these expectantly.  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  Review of Systems A 10 point review of systems was performed and it is as noted above otherwise negative.  Past Medical History:  Diagnosis Date  . Acute blood loss anemia   . Critical illness myopathy   . Diabetes mellitus without complication ([place])   . Dyspnea 08/23/2020  . Hearing loss in right ear   . Hypertension   . Sleep apnea    Past Surgical History:  Procedure Laterality Date  . TONSILLECTOMY    . WOUND DEBRIDEMENT N/A 06/29/2020   Procedure: DEBRIDEMENT SACRAL WOUND;  Surgeon: Ralene Ok, MD;  Location: [facility] OR;  Service: General;  Laterality: N/A;   Family History  Problem Relation Age of Onset  . Diabetes Mellitus II Mother   . Hypertension Mother   . Diabetes Mother   . Diabetes Mellitus II Father   . High blood pressure Father   . Heart attack Father    Social History   Tobacco Use  . Smoking status: Never Smoker  . Smokeless tobacco: Never Used  Substance Use Topics  . Alcohol use: No   No Known Allergies  Current Meds  Medication Sig  . ascorbic acid (VITAMIN C) 500 MG tablet Take 1 tablet (500 mg total) by mouth daily.  Marland Kitchen  aspirin 81 MG chewable tablet Chew 1 tablet (81 mg total) by mouth daily.  . insulin lispro (HUMALOG KWIKPEN) 100 UNIT/ML KwikPen Inject 4 Units into the skin 3 (three) times daily. Take with meals (as long as blood sugars are over 80) (Patient not taking: No sig reported)  . Insulin Pen Needle (CAREFINE PEN NEEDLES) 32G X 6 MM MISC 1 applicator by Does not apply route 4 (four) times daily -  with meals and at bedtime.  Marland Kitchen LANTUS SOLOSTAR 100 UNIT/ML Solostar Pen Inject into the skin.  . metoprolol tartrate (LOPRESSOR) 50 MG tablet Take 1.5 tablets (75 mg total) by mouth 2 (two) times daily.  .  Multiple Vitamin (MULTIVITAMIN WITH MINERALS) TABS tablet Take 1 tablet by mouth daily.  . ondansetron (ZOFRAN) 4 MG tablet Take 1 tablet (4 mg total) by mouth every 8 (eight) hours as needed for nausea or vomiting. (Patient not taking: No sig reported)  . senna-docusate (SENOKOT-S) 8.6-50 MG tablet Take 2 tablets by mouth 2 (two) times daily. (Patient not taking: No sig reported)  . [DISCONTINUED] amoxicillin-clavulanate (AUGMENTIN) 875-125 MG tablet Take 1 tablet by mouth 2 (two) times daily.  . [DISCONTINUED] blood glucose meter kit and supplies Dispense based on patient and insurance preference. Use up to four times daily as directed. (FOR ICD-10 E10.9, E11.9).  . [DISCONTINUED] gabapentin (NEURONTIN) 800 MG tablet Take 1 tablet (800 mg total) by mouth 4 (four) times daily as needed.  . [DISCONTINUED] oxyCODONE-acetaminophen (PERCOCET) 10-325 MG tablet Take 1 tablet by mouth every 6 (six) hours as needed for pain.  . [DISCONTINUED] potassium chloride SA (KLOR-CON) 20 MEQ tablet Take 2 tablets (40 mEq total) by mouth 2 (two) times daily.    There is no immunization history on file for this patient.  We discussed Covid-19 precautions.  I reviewed the vaccine effectiveness and potential side effects in detail to include differences between mRNA vaccines and traditional vaccines (attenuated virus).  Discussion also offered of long-term effectiveness and safety profile which are unclear at this time.  Discussed current CDC guidance that all patients are recommended COVID-19 vaccinations as long as they do not have allergy to components of the vaccine.      Objective:   Physical Exam BP 126/74 (BP Location: Left Arm, Cuff Size: Normal)   Pulse 87   Temp 97.8 F (36.6 C) (Temporal)   Ht '5\' 10"'  (1.778 m)   Wt (!) 367 lb (166.5 kg)   SpO2 96%   BMI 52.66 kg/m  GENERAL: Morbidly obese man, no acute distress, flat affect.  No conversational dyspnea. HEAD: Normocephalic, atraumatic.  EYES: Pupils  equal, round, reactive to light.  No scleral icterus.  MOUTH: Nose/mouth/throat not examined due to masking requirements for COVID 19. NECK: Supple. No thyromegaly. Trachea midline. No JVD.  No adenopathy. PULMONARY: Good air entry bilaterally.  Coarse with no other adventitious sounds. CARDIOVASCULAR: S1 and S2. Regular rate and rhythm.  No rubs, murmurs or gallops heard. ABDOMEN: Obese otherwise benign. MUSCULOSKELETAL: No joint deformity, no clubbing, no edema.  NEUROLOGIC: Weakness of the left upper extremity, gait not disturbed.  Speech fluent. SKIN: Intact,warm,dry. PSYCH: Flat affect, normal behavior  Minimal peripheral opacities persist.  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 Personal history of COVID-19  Z86.16    This issue adds complexity to his management  5. Pressure ulcer of sacral region, stage 4 ([place])  L89.154    Being followed by wound therapy On IV antibiotics  6. OSA (obstructive sleep apnea)  G47.33    High suspect for sleep apnea Patient has been scheduled for a sleep study by primary care  7. Morbid obesity with BMI of 50.0-59.9, adult ([place])  E66.01    Z68.43    This issue adds complexity to his management Weight loss highly recommended    Standpoint this patient is doing remarkably well.  It is actually miraculous that he survived his COVID-19 pneumonia and ARDS.  He likely has some residual postinflammatory fibrosis that currently is not posing him any difficulties with dyspnea or hypoxemia.  This  should not be progressive.  Currently no other work-up or medications are necessary.  I did recommend to the patient to follow through with the sleep study that has been ordered.  We will see the patient in follow-up on an as-needed basis.  Should he require follow-up with regards to his sleep study recommend appointment with one of our sleep medicine providers.  Renold Don, MD [facility]   *This note was dictated using voice recognition software/Dragon.  Despite best efforts to proofread, errors can occur which can change the meaning.  Any change was purely unintentional.

## 2020-09-26 ENCOUNTER — Ambulatory Visit (INDEPENDENT_AMBULATORY_CARE_PROVIDER_SITE_OTHER): Payer: BC Managed Care – PPO | Admitting: Neurology

## 2020-09-26 DIAGNOSIS — Z9981 Dependence on supplemental oxygen: Secondary | ICD-10-CM

## 2020-09-26 DIAGNOSIS — R0601 Orthopnea: Secondary | ICD-10-CM

## 2020-09-26 DIAGNOSIS — U071 COVID-19: Secondary | ICD-10-CM

## 2020-09-26 DIAGNOSIS — J1282 Pneumonia due to coronavirus disease 2019: Secondary | ICD-10-CM

## 2020-09-26 DIAGNOSIS — G4733 Obstructive sleep apnea (adult) (pediatric): Secondary | ICD-10-CM | POA: Diagnosis not present

## 2020-09-26 DIAGNOSIS — G4734 Idiopathic sleep related nonobstructive alveolar hypoventilation: Secondary | ICD-10-CM

## 2020-09-26 DIAGNOSIS — E662 Morbid (severe) obesity with alveolar hypoventilation: Secondary | ICD-10-CM

## 2020-09-26 DIAGNOSIS — J9601 Acute respiratory failure with hypoxia: Secondary | ICD-10-CM

## 2020-09-27 ENCOUNTER — Encounter (HOSPITAL_COMMUNITY): Payer: Self-pay

## 2020-09-27 ENCOUNTER — Ambulatory Visit (HOSPITAL_COMMUNITY): Admission: RE | Admit: 2020-09-27 | Payer: BC Managed Care – PPO | Source: Ambulatory Visit

## 2020-09-28 ENCOUNTER — Other Ambulatory Visit: Payer: Self-pay | Admitting: Internal Medicine

## 2020-09-28 ENCOUNTER — Encounter (HOSPITAL_BASED_OUTPATIENT_CLINIC_OR_DEPARTMENT_OTHER): Payer: BC Managed Care – PPO | Admitting: Internal Medicine

## 2020-09-28 ENCOUNTER — Other Ambulatory Visit: Payer: Self-pay

## 2020-09-28 ENCOUNTER — Other Ambulatory Visit: Payer: Self-pay | Admitting: Certified Registered Nurse Anesthetist

## 2020-09-28 DIAGNOSIS — L89154 Pressure ulcer of sacral region, stage 4: Secondary | ICD-10-CM

## 2020-09-28 DIAGNOSIS — E11622 Type 2 diabetes mellitus with other skin ulcer: Secondary | ICD-10-CM | POA: Diagnosis not present

## 2020-09-28 NOTE — Progress Notes (Signed)
Collin Collin Brown ([id number]) Visit Report for 09/28/2020 Arrival Information Details Patient Name: Date of Service: Collin Collin Brown, [place] M. 09/28/2020 1:30 PM Medical Record Number: [id number] Patient Account Number: [id number] Date of Birth/Sex: Treating RN: 1986-02-04 (35 y.o. Collin Collin Brown, Collin Collin Brown Primary Care : Laverna Peace Other Clinician: Referring : Treating /Extender: Collin Collin Brown, [name] Weeks in Treatment: 6 Visit Information History Since Last Visit Added or deleted any medications: No Patient Arrived: Ambulatory Any new allergies or adverse reactions: No Arrival Time: 13:34 Had a fall or experienced change in No Accompanied By: family activities of daily living that may affect Transfer Assistance: None risk of falls: Patient Identification Verified: Yes Signs or symptoms of abuse/neglect since last visito No Secondary Verification Process Completed: Yes Hospitalized since last visit: No Patient Requires Transmission-Based Precautions: No Implantable device outside of the clinic excluding No Patient Has Alerts: No cellular tissue based products placed in the center since last visit: Has Dressing in Place as Prescribed: Yes Collin Brown Present Now: No Electronic Signature(s) Signed: 09/28/2020 5:09:47 PM By: Rhae Hammock RN Entered By: Rhae Hammock on 09/28/2020 13:35:05 -------------------------------------------------------------------------------- Clinic Level of Care Assessment Details Patient Name: Date of Service: Collin Collin Brown, [place] [name] 09/28/2020 1:30 PM Medical Record Number: [id number] Patient Account Number: [id number] Date of Birth/Sex: Treating RN: [birth year] (35 y.o. Collin Collin Brown Primary Care : Laverna Peace Other Clinician: Referring : Treating /Extender: Collin Collin Brown, [name] Weeks in Treatment: 6 Clinic Level of Care Assessment Items TOOL 4 Quantity Score [] -  0 Use when only an EandM is performed on FOLLOW-UP visit ASSESSMENTS - Nursing Assessment / Reassessment X- 1 10 Reassessment of Co-morbidities (includes updates in patient status) X- 1 5 Reassessment of Adherence to Treatment Plan ASSESSMENTS - Wound and Skin A ssessment / Reassessment X - Simple Wound Assessment / Reassessment - one wound 1 5 [] - 0 Complex Wound Assessment / Reassessment - multiple wounds [] - 0 Dermatologic / Skin Assessment (not related to wound area) ASSESSMENTS - Focused Assessment [] - 0 Circumferential Edema Measurements - multi extremities [] - 0 Nutritional Assessment / Counseling / Intervention [] - 0 Lower Extremity Assessment (monofilament, tuning fork, pulses) [] - 0 Peripheral Arterial Disease Assessment (using hand held doppler) ASSESSMENTS - Ostomy and/or Continence Assessment and Care [] - 0 Incontinence Assessment and Management [] - 0 Ostomy Care Assessment and Management (repouching, etc.) PROCESS - Coordination of Care X - Simple Patient / Family Education for ongoing care 1 15 [] - 0 Complex (extensive) Patient / Family Education for ongoing care X- 1 10 Staff obtains Programmer, systems, Records, T Results / Process Orders est X- 1 10 Staff telephones HHA, Nursing Homes / Clarify orders / etc [] - 0 Routine Transfer to another Facility (non-emergent condition) [] - 0 Routine Hospital Admission (non-emergent condition) [] - 0 New Admissions / Biomedical engineer / Ordering NPWT Apligraf, etc. , [] - 0 Emergency Hospital Admission (emergent condition) X- 1 10 Simple Discharge Coordination [] - 0 Complex (extensive) Discharge Coordination PROCESS - Special Needs [] - 0 Pediatric / Minor Patient Management [] - 0 Isolation Patient Management [] - 0 Hearing / Language / Visual special needs [] - 0 Assessment of Community assistance (transportation, D/C planning, etc.) [] - 0 Additional assistance / Altered mentation [] -  0 Support Surface(s) Assessment (bed, cushion, seat, etc.) INTERVENTIONS - Wound Cleansing / Measurement X - Simple Wound Cleansing - one wound 1 5 [] - 0 Complex Wound Cleansing - multiple  wounds X- 1 5 Wound Imaging (photographs - any number of wounds) [] - 0 Wound Tracing (instead of photographs) X- 1 5 Simple Wound Measurement - one wound [] - 0 Complex Wound Measurement - multiple wounds INTERVENTIONS - Wound Dressings X - Small Wound Dressing one or multiple wounds 1 10 [] - 0 Medium Wound Dressing one or multiple wounds [] - 0 Large Wound Dressing one or multiple wounds X- 1 5 Application of Medications - topical [] - 0 Application of Medications - injection INTERVENTIONS - Miscellaneous [] - 0 External ear exam [] - 0 Specimen Collection (cultures, biopsies, blood, body fluids, etc.) [] - 0 Specimen(s) / Culture(s) sent or taken to Lab for analysis [] - 0 Patient Transfer (multiple staff / Civil Service fast streamer / Similar devices) [] - 0 Simple Staple / Suture removal (25 or less) [] - 0 Complex Staple / Suture removal (26 or more) [] - 0 Hypo / Hyperglycemic Management (close monitor of Blood Glucose) [] - 0 Ankle / Brachial Index (ABI) - do not check if billed separately X- 1 5 Vital Signs Has the patient been seen at the hospital within the last three years: Yes Total Score: 100 Level Of Care: New/Established - Level 3 Electronic Signature(s) Signed: 09/28/2020 5:33:43 PM By: Baruch Gouty RN, BSN Entered By: Baruch Gouty on 09/28/2020 13:54:36 -------------------------------------------------------------------------------- Encounter Discharge Information Details Patient Name: Date of Service: Collin Pain, [name] 09/28/2020 1:30 PM Medical Record Number: [id number] Patient Account Number: [id number] Date of Birth/Sex: Treating RN: 03-17-1986 (35 y.o. Collin Collin Brown Primary Care : Laverna Peace Other Clinician: Referring : Treating  /Extender: Collin Collin Brown, [name] Weeks in Treatment: 6 Encounter Discharge Information Items Discharge Condition: Stable Ambulatory Status: Ambulatory Discharge Destination: Home Transportation: Private Auto Accompanied By: mother Schedule Follow-up Appointment: Yes Clinical Summary of Care: Electronic Signature(s) Signed: 09/28/2020 5:04:58 PM By: Deon Pilling Entered By: Deon Pilling on 09/28/2020 14:05:35 -------------------------------------------------------------------------------- Lower Extremity Assessment Details Patient Name: Date of Service: Collin Collin Brown, [place] [name] 09/28/2020 1:30 PM Medical Record Number: [id number] Patient Account Number: [id number] Date of Birth/Sex: Treating RN: Aug 04, 1986 (35 y.o. Collin Collin Brown, Collin Collin Brown Primary Care : Laverna Peace Other Clinician: Referring : Treating /Extender: Collin Collin Brown, [name] Weeks in Treatment: 6 Electronic Signature(s) Signed: 09/28/2020 5:09:47 PM By: Rhae Hammock RN Entered By: Rhae Hammock on 09/28/2020 13:43:41 -------------------------------------------------------------------------------- Multi Wound Chart Details Patient Name: Date of Service: Collin Pain, [name] 09/28/2020 1:30 PM Medical Record Number: [id number] Patient Account Number: [id number] Date of Birth/Sex: Treating RN: 1985/10/17 (35 y.o. Collin Collin Brown Primary Care : Laverna Peace Other Clinician: Referring : Treating /Extender: Collin Collin Brown, [name] Weeks in Treatment: 6 Vital Signs Height(in): 70 Pulse(bpm): 87 Weight(lbs): 341 Blood Pressure(mmHg): 135/79 Body Mass Index(BMI): 49 Temperature(F): 98 Respiratory Rate(breaths/min): 17 Photos: [1:No Photos Sacrum] [N/A:N/A N/A] Wound Location: [1:Pressure Injury] [N/A:N/A] Wounding Event: [1:Pressure Ulcer] [N/A:N/A] Primary Etiology: [1:Anemia, Sleep Apnea, Hypertension,  N/A] Comorbid History: [1:Type II Diabetes 05/18/2020] [N/A:N/A] Date Acquired: [1:6] [N/A:N/A] Weeks of Treatment: [1:Open] [N/A:N/A] Wound Status: [1:2x2.2x5.5] [N/A:N/A] Measurements L x W x D (cm) [1:3.456] [N/A:N/A] A (cm) : rea [1:19.007] [N/A:N/A] Volume (cm) : [1:-141.80%] [N/A:N/A] % Reduction in A rea: [1:-195.50%] [N/A:N/A] % Reduction in Volume: [1:Category/Stage IV] [N/A:N/A] Classification: [1:Large] [N/A:N/A] Exudate A mount: [1:Purulent] [N/A:N/A] Exudate Type: [1:yellow, Collin Brown, green] [N/A:N/A] Exudate Color: [1:Well defined, not attached] [N/A:N/A] Wound Margin: [1:Large (67-100%)] [N/A:N/A] Granulation A mount: [1:Pink] [N/A:N/A] Granulation Quality: [1:None Present (0%)] [N/A:N/A] Necrotic A mount: [1:Fat Layer (  Subcutaneous Tissue): Yes N/A] Exposed Structures: [1:Fascia: No Tendon: No Muscle: No Joint: No Bone: No None] [N/A:N/A] Treatment Notes Electronic Signature(s) Signed: 09/28/2020 5:25:02 PM By: Linton Ham MD Signed: 09/28/2020 5:33:43 PM By: Baruch Gouty RN, BSN Entered By: Linton Ham on 09/28/2020 13:57:06 -------------------------------------------------------------------------------- Multi-Disciplinary Care Plan Details Patient Name: Date of Service: Collin Pain, [name] 09/28/2020 1:30 PM Medical Record Number: [id number] Patient Account Number: [id number] Date of Birth/Sex: Treating RN: 1985-12-13 (35 y.o. Collin Collin Brown Primary Care Ellese Julius: Laverna Peace Other Clinician: Referring Kellyn Mansfield: Treating Donice Alperin/Extender: Collin Collin Brown, [name] Weeks in Treatment: 6 Active Inactive Nutrition Nursing Diagnoses: Potential for alteratiion in Nutrition/Potential for imbalanced nutrition Goals: Patient/caregiver agrees to and verbalizes understanding of need to obtain nutritional consultation Date Initiated: 08/16/2020 Date Inactivated: 09/14/2020 Target Resolution Date: 09/14/2020 Goal Status:  Met Patient/caregiver verbalizes understanding of need to maintain therapeutic glucose control per primary care physician Date Initiated: 08/16/2020 Target Resolution Date: 10/04/2020 Goal Status: Active Interventions: Assess HgA1c results as ordered upon admission and as needed Provide education on elevated blood sugars and impact on wound healing Provide education on nutrition Treatment Activities: Obtain HgA1c : 08/16/2020 Patient referred to Primary Care Physician for further nutritional evaluation : 08/16/2020 Notes: Pressure Nursing Diagnoses: Knowledge deficit related to management of pressures ulcers Potential for impaired tissue integrity related to pressure, friction, moisture, and shear Goals: Patient will remain free from development of additional pressure ulcers Date Initiated: 08/16/2020 Date Inactivated: 09/14/2020 Target Resolution Date: 09/14/2020 Goal Status: Met Patient/caregiver will verbalize understanding of pressure ulcer management Date Initiated: 08/16/2020 Target Resolution Date: 10/12/2020 Goal Status: Active Interventions: Assess: immobility, friction, shearing, incontinence upon admission and as needed Assess potential for pressure ulcer upon admission and as needed Provide education on pressure ulcers Treatment Activities: Consult for HBO : 08/16/2020 Pressure reduction/relief device ordered : 08/16/2020 T ordered outside of clinic : 08/16/2020 est Notes: Wound/Skin Impairment Nursing Diagnoses: Knowledge deficit related to ulceration/compromised skin integrity Goals: Patient/caregiver will verbalize understanding of skin care regimen Date Initiated: 08/16/2020 Target Resolution Date: 10/12/2020 Goal Status: Active Interventions: Assess patient/caregiver ability to perform ulcer/skin care regimen upon admission and as needed Assess ulceration(s) every visit Provide education on ulcer and skin care Treatment Activities: Skin care regimen initiated :  08/16/2020 Topical wound management initiated : 08/16/2020 Notes: Electronic Signature(s) Signed: 09/28/2020 5:33:43 PM By: Baruch Gouty RN, BSN Entered By: Baruch Gouty on 09/28/2020 13:37:09 -------------------------------------------------------------------------------- Collin Brown Assessment Details Patient Name: Date of Service: Collin Pain, [name] 09/28/2020 1:30 PM Medical Record Number: [id number] Patient Account Number: [id number] Date of Birth/Sex: Treating RN: 09/21/1985 (35 y.o. Collin Collin Brown, Collin Collin Brown Primary Care Londynn Sonoda: Laverna Peace Other Clinician: Referring Kale Rondeau: Treating Shemekia Patane/Extender: Collin Collin Brown, [name] Weeks in Treatment: 6 Active Problems Location of Collin Brown Severity and Description of Collin Brown Patient Has Paino No Site Locations Collin Brown Management and Medication Current Collin Brown Management: Electronic Signature(s) Signed: 09/28/2020 5:09:47 PM By: Rhae Hammock RN Entered By: Rhae Hammock on 09/28/2020 13:37:28 -------------------------------------------------------------------------------- Patient/Caregiver Education Details Patient Name: Date of Service: Collin Pain, MA TTHEW Jerilynn Mages 2/18/2022andnbsp1:30 PM Medical Record Number: [id number] Patient Account Number: [id number] Date of Birth/Gender: Treating RN: 1985/12/13 (35 y.o. Collin Collin Brown Primary Care Physician: Laverna Peace Other Clinician: Referring Physician: Treating Physician/Extender: Collin Collin Brown, [name] Weeks in Treatment: 6 Education Assessment Education Provided To: Patient Education Topics Provided Elevated Blood Sugar/ Impact on Healing: Methods: Explain/Verbal Responses: Reinforcements needed, State content correctly Pressure: Methods: Explain/Verbal Responses: Reinforcements needed, State content correctly Wound/Skin Impairment: Methods: Explain/Verbal Responses: Reinforcements  needed, State content correctly Electronic Signature(s) Signed:  09/28/2020 5:33:43 PM By: Baruch Gouty RN, BSN Entered By: Baruch Gouty on 09/28/2020 13:37:39 -------------------------------------------------------------------------------- Wound Assessment Details Patient Name: Date of Service: Collin Pain, [name] 09/28/2020 1:30 PM Medical Record Number: [id number] Patient Account Number: [id number] Date of Birth/Sex: Treating RN: 02-14-1986 (35 y.o. Collin Collin Brown, Collin Collin Brown Primary Care : Laverna Peace Other Clinician: Referring : Treating /Extender: Collin Collin Brown, [name] Weeks in Treatment: 6 Wound Status Wound Number: 1 Primary Etiology: Pressure Ulcer Wound Location: Sacrum Wound Status: Open Wounding Event: Pressure Injury Comorbid History: Anemia, Sleep Apnea, Hypertension, Type II Diabetes Date Acquired: 05/18/2020 Weeks Of Treatment: 6 Clustered Wound: No Wound Measurements Length: (cm) 2 Width: (cm) 2.2 Depth: (cm) 5.5 Area: (cm) 3.456 Volume: (cm) 19.007 % Reduction in Area: -141.8% % Reduction in Volume: -195.5% Epithelialization: None Tunneling: No Undermining: No Wound Description Classification: Category/Stage IV Wound Margin: Well defined, not attached Exudate Amount: Large Exudate Type: Purulent Exudate Color: yellow, Collin Brown, green Foul Odor After Cleansing: No Slough/Fibrino No Wound Bed Granulation Amount: Large (67-100%) Exposed Structure Granulation Quality: Pink Fascia Exposed: No Necrotic Amount: None Present (0%) Fat Layer (Subcutaneous Tissue) Exposed: Yes Tendon Exposed: No Muscle Exposed: No Joint Exposed: No Bone Exposed: No Treatment Notes Wound #1 (Sacrum) Cleanser Normal Saline Discharge Instruction: Cleanse the wound with Normal Saline prior to applying a clean dressing using gauze sponges, not tissue or cotton balls. Normal Saline Discharge Instruction: Cleanse the wound with Normal Saline prior to applying a clean dressing using gauze sponges,  not tissue or cotton balls. Peri-Wound Care Skin Prep Discharge Instruction: Use skin prep as directed Topical Primary Dressing KerraCel Ag Gelling Fiber Dressing, 4x5 in (silver alginate) Discharge Instruction: Apply silver alginate packed lightly into wound Secondary Dressing ComfortFoam Border, 6x6 in (silicone border) Discharge Instruction: Apply over primary dressing as directed. Secured With Compression Wrap Compression Stockings Environmental education officer) Signed: 09/28/2020 5:09:47 PM By: Rhae Hammock RN Entered By: Rhae Hammock on 09/28/2020 13:45:01 -------------------------------------------------------------------------------- Vitals Details Patient Name: Date of Service: Collin Pain, [name] 09/28/2020 1:30 PM Medical Record Number: [id number] Patient Account Number: [id number] Date of Birth/Sex: Treating RN: 1985-10-30 (34 y.o. Collin Collin Brown, Collin Collin Brown Primary Care : Laverna Peace Other Clinician: Referring : Treating /Extender: Collin Collin Brown, [name] Weeks in Treatment: 6 Vital Signs Time Taken: 13:35 Temperature (F): 98 Height (in): 70 Pulse (bpm): 87 Weight (lbs): 341 Respiratory Rate (breaths/min): 17 Body Mass Index (BMI): 48.9 Blood Pressure (mmHg): 135/79 Reference Range: 80 - 120 mg / dl Electronic Signature(s) Signed: 09/28/2020 5:09:47 PM By: Rhae Hammock RN Entered By: Rhae Hammock on 09/28/2020 13:35:32
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
## 2020-10-02 ENCOUNTER — Other Ambulatory Visit: Payer: Self-pay | Admitting: Neurology

## 2020-10-02 DIAGNOSIS — G7281 Critical illness myopathy: Secondary | ICD-10-CM

## 2020-10-02 DIAGNOSIS — Z7409 Other reduced mobility: Secondary | ICD-10-CM

## 2020-10-02 DIAGNOSIS — R0601 Orthopnea: Secondary | ICD-10-CM | POA: Insufficient documentation

## 2020-10-02 DIAGNOSIS — E662 Morbid (severe) obesity with alveolar hypoventilation: Secondary | ICD-10-CM | POA: Insufficient documentation

## 2020-10-02 DIAGNOSIS — M792 Neuralgia and neuritis, unspecified: Secondary | ICD-10-CM

## 2020-10-02 DIAGNOSIS — Z9981 Dependence on supplemental oxygen: Secondary | ICD-10-CM

## 2020-10-02 DIAGNOSIS — J9601 Acute respiratory failure with hypoxia: Secondary | ICD-10-CM

## 2020-10-02 DIAGNOSIS — U071 COVID-19: Secondary | ICD-10-CM

## 2020-10-02 DIAGNOSIS — G4733 Obstructive sleep apnea (adult) (pediatric): Secondary | ICD-10-CM | POA: Insufficient documentation

## 2020-10-02 DIAGNOSIS — A419 Sepsis, unspecified organism: Secondary | ICD-10-CM

## 2020-10-02 DIAGNOSIS — U099 Post covid-19 condition, unspecified: Secondary | ICD-10-CM

## 2020-10-02 NOTE — Progress Notes (Signed)
POLYSOMNOGRAPHY IMPRESSION:  Severe hypoventilation with hypopneas creating an AHI of 50.4/h and all sleep non supine.  Diagnosis of Obstructive Sleep Apnea and Obesity Hypoventilation, based on 52 minutes of hypoxemia at nadir of SpO2 81%.   RECOMMENDATIONS: The use of CPAP at only 7 cm water corrected most of the obstructive hypoventilation events but hypoxia was still noted, The patient will continue to use CPAP at a new setting of 7 cm water pressure without EPR and wear an overnight pulsoximetry one night to see if hypoxia is sufficiently addressed.  A follow up appointment will be scheduled in the Sleep Clinic at [facility].     I certify that I have reviewed the entire raw data recording prior to the issuance of this report in accordance with the Standards of Accreditation of the American Academy of Sleep Medicine (AASM)

## 2020-10-02 NOTE — Addendum Note (Signed)
Addended by: Melvyn Novas on: 10/02/2020 06:20 PM   Modules accepted: Orders

## 2020-10-02 NOTE — Procedures (Signed)
PATIENT'S NAME:  Collin Brown, Collin Brown DOB:      1985/11/23      MR#:    [id number]     DATE OF RECORDING: 09/26/2020 Christeen Douglas REFERRING M.D.:  [name], FNP Study Performed:  Split-Night Titration Study HISTORY: This 35 year old male Patient is already using CPAP but never had a sleep study- his current machine was bought from a friend of the family and needs settings, possibly even a new machine He is excessively daytime sleepy.  Was hospitalized in September 2021 with COVID, ICU stay, persistent SOB, morbid obesity and using a wound vac due to bedsores he has developed. Has orthopnea and needs Bed 2, high risk for obesity hypoventilation and possibly in need of oxygen.  The patient endorsed the Epworth Sleepiness Scale at 5/24 points   The patient's weight 365 pounds with a height of 60 (inches), resulting in a BMI of 71.8 kg/m2. The patient's neck circumference measured 19 inches.  CURRENT MEDICATIONS: Tylenol, Vitamin C, Santyl, Advair, Humalog Kwikpen, Carefine Pen, Zofran, Oxycodone, Klorcon, Senokot-s-= was on gabapentin.    PROCEDURE:  This is a multichannel digital polysomnogram utilizing the Somnostar 11.2 system.  Electrodes and sensors were applied and monitored per AASM Specifications.   EEG, EOG, Chin and Limb EMG, were sampled at 200 Hz.  ECG, Snore and Nasal Pressure, Thermal Airflow, Respiratory Effort, CPAP Flow and Pressure, Oximetry was sampled at 50 Hz. Digital video and audio were recorded.      BASELINE STUDY WITHOUT CPAP RESULTS: Lights Out was at 22:01 and Lights On at 05:24.  Total recording time (TRT) was 166, with a total sleep time (TST) of 153.5 minutes.   The patient's sleep latency was 11.5 minutes.  REM latency was 136.5 minutes.  The sleep efficiency was 92.5 %.    SLEEP ARCHITECTURE: WASO (Wake after sleep onset) was 3.5 minutes, Stage N1 was 8.5 minutes, Stage N2 was 64.5 minutes, Stage N3 was 64 minutes and Stage R (REM sleep) was 16.5 minutes.  The  percentages were Stage N1 5.5%, Stage N2 42.%, Stage N3 41.7% and Stage R (REM sleep) 10.7%.  RESPIRATORY ANALYSIS:  There were a total of 129 respiratory events: 129 hypopneas.  The total APNEA/HYPOPNEA INDEX (AHI) was 50.4 /hour.  12 events occurred in REM sleep and 234 events in NREM. The REM AHI was 43.6 /hour versus a non-REM AHI of 51.2 /hour. The patient spent 0 minutes sleep time in the supine position 391 minutes in non-supine. The non-supine AHI was  52.7 /hour. OXYGEN SATURATION & C02:  The wake baseline 02 saturation was 90%, with the lowest being 81%. Time spent below 89% saturation equaled 52 minutes. PERIODIC LIMB MOVEMENTS: The patient had a total of 34 Periodic Limb Movements.  The Periodic Limb Movement (PLM) Arousal index was 1.2 /hour. The arousals were noted as: 20 were spontaneous, 3 were associated with PLMs, 33 were associated with respiratory events.        TITRATION STUDY WITH CPAP RESULTS: CPAP was initiated at 5 cmH20 with heated humidity per AASM split night standards and pressure was advanced to 7 cmH20 because of hypopneas, apneas and desaturations. The mask type was a ResMed Simplus Full Face mask (Medium)   At a PAP pressure of 7 cmH20, there was a reduction of the AHI to 0.4 /hour. He slept 145 minutes at this pressure, still had reduced oxygen saturation.  Total recording time (TRT) was 278 minutes, with a total sleep time (TST) of 237.5  minutes. The patient's sleep latency was 8 minutes. REM latency was 11.5 minutes.  The sleep efficiency was 85.4 %.    SLEEP ARCHITECTURE: Wake after sleep was 38 minutes, Stage N1 25 minutes, Stage N2 91.5 minutes, Stage N3 81.5 minutes and Stage R (REM sleep) 39.5 minutes. The percentages were: Stage N1 10.5%, Stage N2 38.5%, Stage N3 34.3% and Stage R (REM sleep) 16.6%.   RESPIRATORY ANALYSIS:  There were a total of 4 respiratory events: 4 hypopneas. The total APNEA/HYPOPNEA INDEX (AHI) was 1.0 /hour 3 events occurred in REM  sleep and 1 event in NREM. The REM AHI was 4.6 /hour versus a non-REM AHI of 0.3 /hour.  The patient spent 0% of total sleep time in the supine position. The supine AHI was 0.0 /hour, versus a non-supine AHI of 1.0/hour. OXYGEN SATURATION & C02: Time below 89% saturation equaled 62 minutes. The arousals were noted as: 11 were spontaneous, 0 were associated with PLMs, 0 were associated with respiratory events. The patient had no longer any Periodic Limb Movements.  POLYSOMNOGRAPHY IMPRESSION :  Severe hypoventilation with hypopneas creating an AHI of 50.4/h and all sleep non supine.  Diagnosis of Obstructive Sleep Apnea and Obesity Hypoventilation, based on 52 minutes of hypoxemia at nadir of SpO2 81%.    RECOMMENDATIONS: The use of CPAP at only 7 cm water corrected most of the obstructive hypoventilation events but hypoxia was still noted, The patient will continue to use CPAP at a new setting of 7 cm water pressure without EPR and wear an overnight pulsoximetry one night to see if hypoxia is sufficiently addressed.  A follow up appointment will be scheduled in the Sleep Clinic at [facility].     I certify that I have reviewed the entire raw data recording prior to the issuance of this report in accordance with the Standards of Accreditation of the American Academy of Sleep Medicine (AASM)    Melvyn Novas, M.D. Diplomat, Biomedical engineer of Psychiatry and Neurology  Diplomat, Biomedical engineer of Sleep Medicine Wellsite geologist, [employer] [facility] [employer]

## 2020-10-03 ENCOUNTER — Other Ambulatory Visit: Payer: Self-pay | Admitting: Neurology

## 2020-10-03 ENCOUNTER — Other Ambulatory Visit: Payer: BC Managed Care – PPO

## 2020-10-03 ENCOUNTER — Ambulatory Visit
Admission: RE | Admit: 2020-10-03 | Discharge: 2020-10-03 | Disposition: A | Discharge status: Home / Self Care | Payer: BC Managed Care – PPO | Source: Ambulatory Visit | Attending: Internal Medicine | Admitting: Internal Medicine

## 2020-10-03 ENCOUNTER — Other Ambulatory Visit: Payer: Self-pay

## 2020-10-03 ENCOUNTER — Telehealth: Payer: Self-pay | Admitting: Neurology

## 2020-10-03 DIAGNOSIS — L89154 Pressure ulcer of sacral region, stage 4: Secondary | ICD-10-CM

## 2020-10-03 DIAGNOSIS — Z9981 Dependence on supplemental oxygen: Secondary | ICD-10-CM

## 2020-10-03 DIAGNOSIS — U099 Post covid-19 condition, unspecified: Secondary | ICD-10-CM

## 2020-10-03 DIAGNOSIS — G4733 Obstructive sleep apnea (adult) (pediatric): Secondary | ICD-10-CM

## 2020-10-03 DIAGNOSIS — Z7409 Other reduced mobility: Secondary | ICD-10-CM

## 2020-10-03 IMAGING — MR MR PELVIS WO/W CM
6 of 8 series · 31 of 48 positions shown · IV contrast (multihance)
Comparison: Sacrum and coccyx x-rays dated [DATE]. CT
abdomen pelvis dated [DATE].

CLINICAL DATA: Nonhealing sacral decubitus ulcer.

EXAM:
MRI PELVIS WITHOUT AND WITH CONTRAST
TECHNIQUE: Multiplanar multisequence MR imaging of the pelvis was performed
both before and after administration of intravenous contrast.
CONTRAST:  20mL MULTIHANCE GADOBENATE DIMEGLUMINE 529 MG/ML IV SOLN

[Series 3: STIR · coronal · 4.0mm · 0.78mm/px · 4 of 31 slices shown]
[im 1/31]
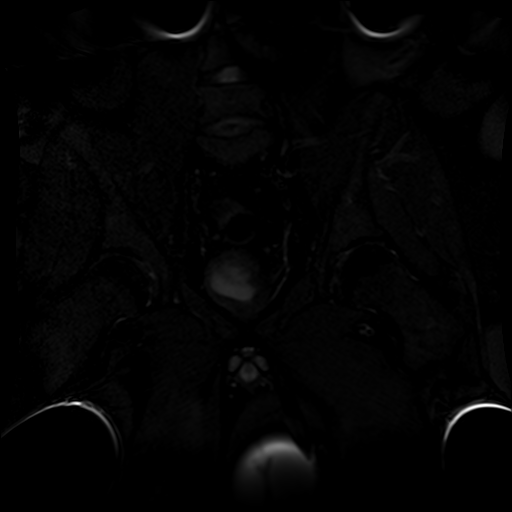
[im 11/31]
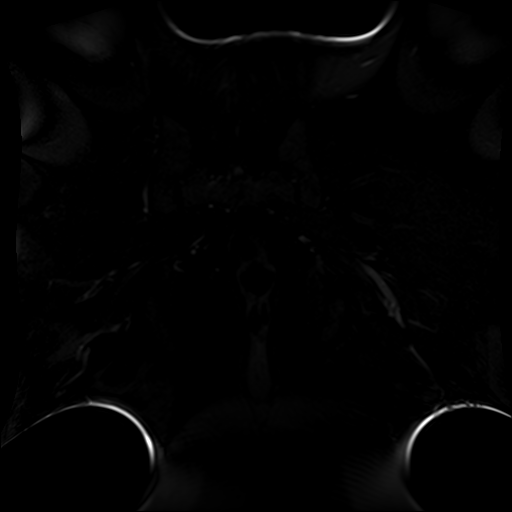
[im 21/31]
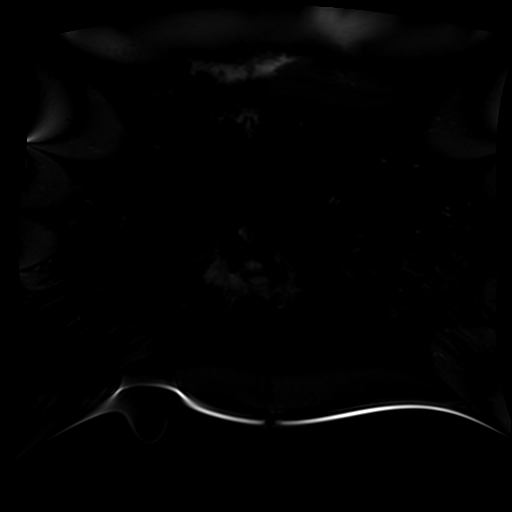
[im 31/31]
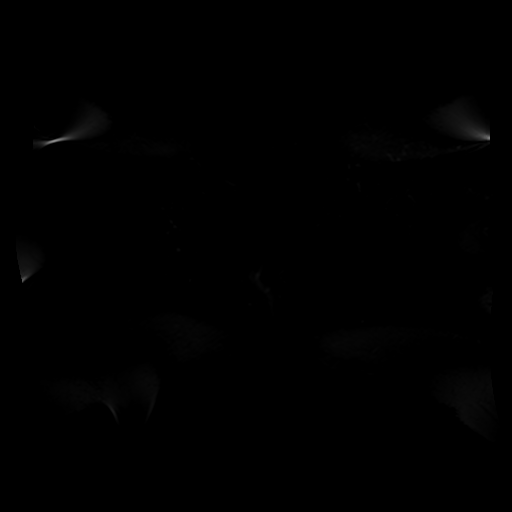

[Series 4: T1 · coronal · 4.0mm · 1.25mm/px · 5 of 35 slices shown (1 of 2)]
[im 1/35]
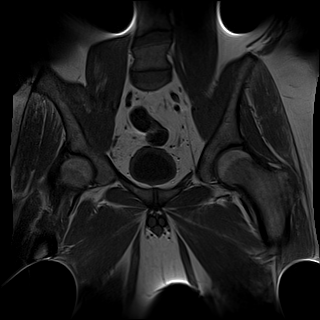
[im 9/35]
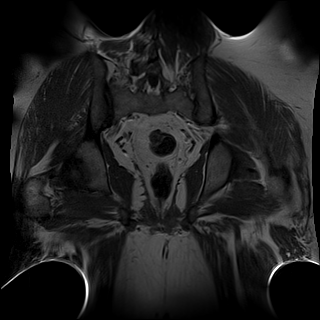
[im 18/35]
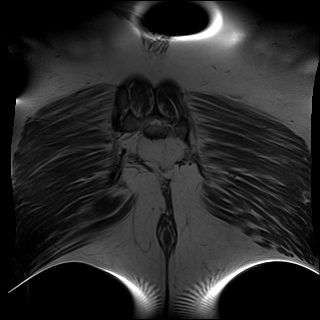
[im 26/35]
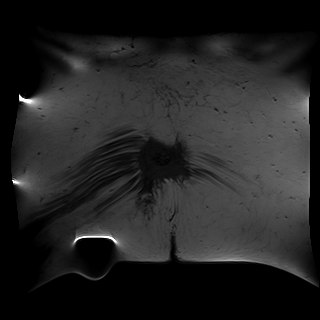
[im 35/35]
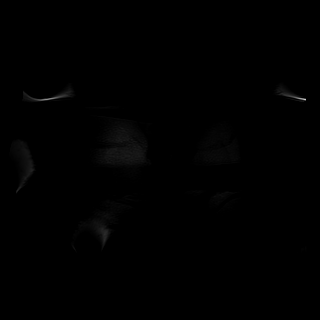

[Series 5: T1 · axial · 4.0mm · 1.25mm/px · z∈[-151,+114]mm · 7 of 50 slices shown (2 of 2)]
[im 1/50]
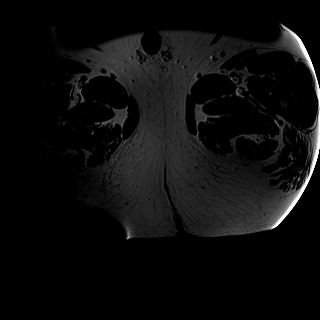
[im 9/50]
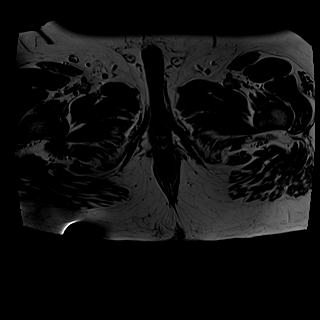
[im 17/50]
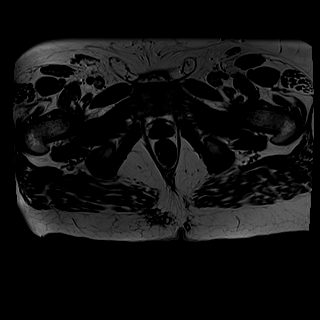
[im 25/50]
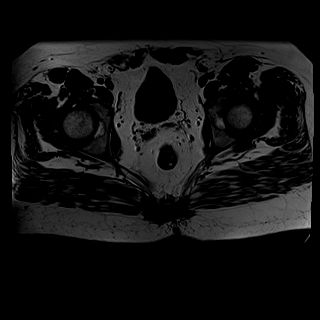
[im 33/50]
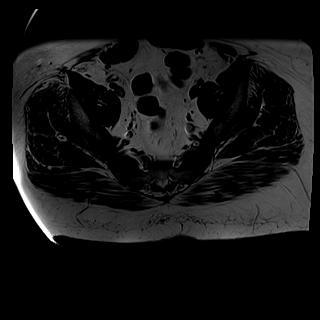
[im 41/50]
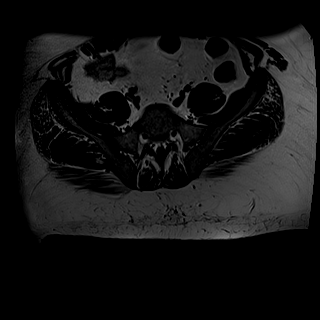
[im 50/50]
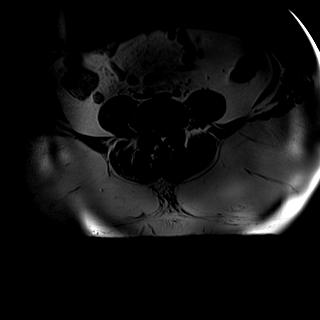

[Series 6: T2 fat-sat · axial · 4.0mm · 1.25mm/px · z∈[-151,+114]mm · 7 of 50 slices shown (1 of 2)]
[im 1/50]
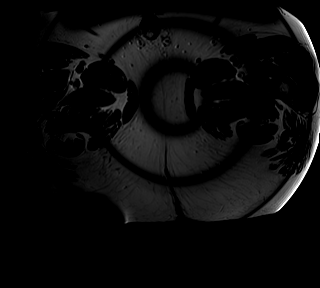
[im 9/50]
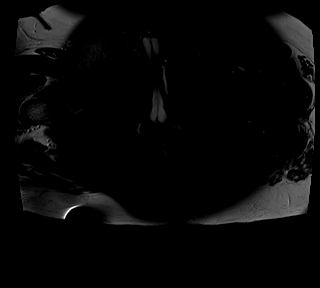
[im 17/50]
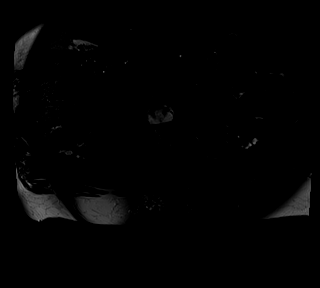
[im 25/50]
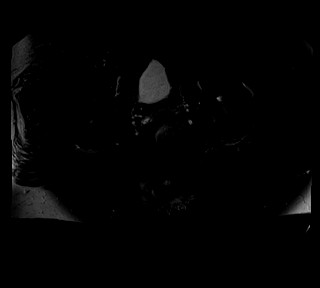
[im 33/50]
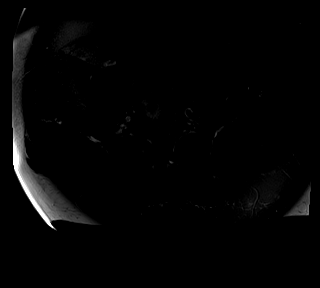
[im 41/50]
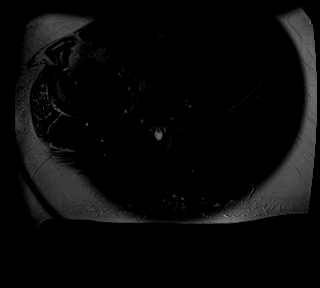
[im 50/50]
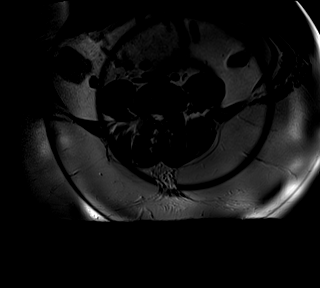

[Series 7: T2 fat-sat · sagittal · 4.0mm · 0.62mm/px · 7 of 53 slices shown (2 of 2)]
[im 1/53]
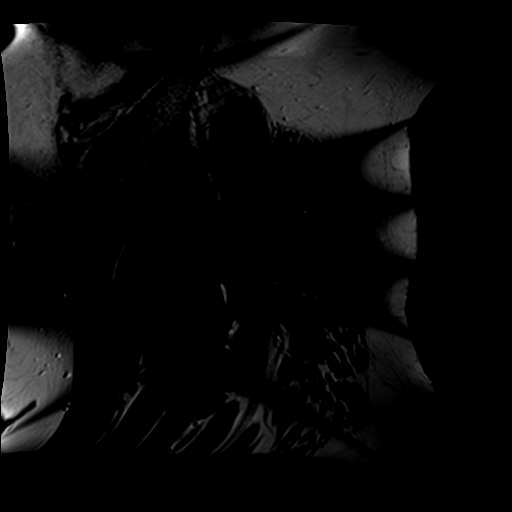
[im 9/53]
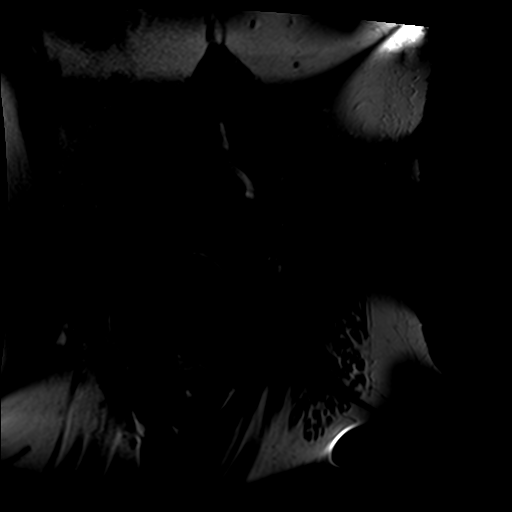
[im 18/53]
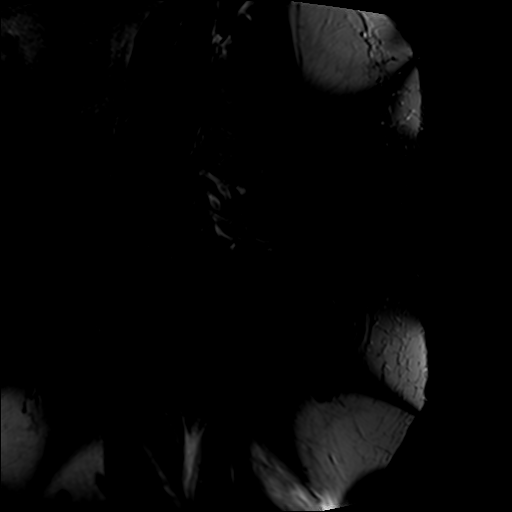
[im 27/53]
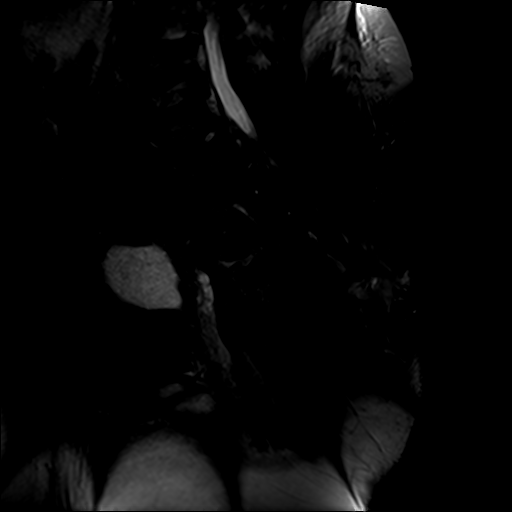
[im 35/53]
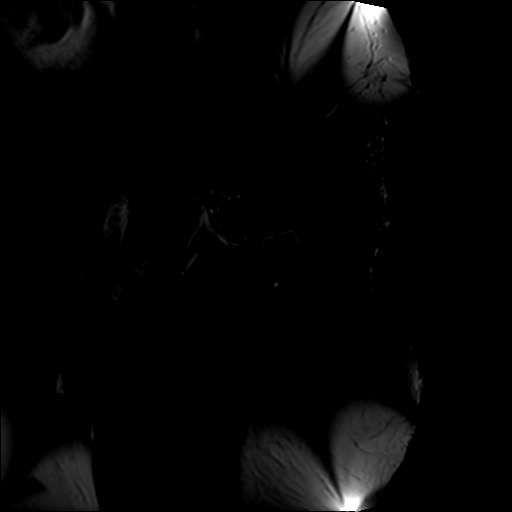
[im 44/53]
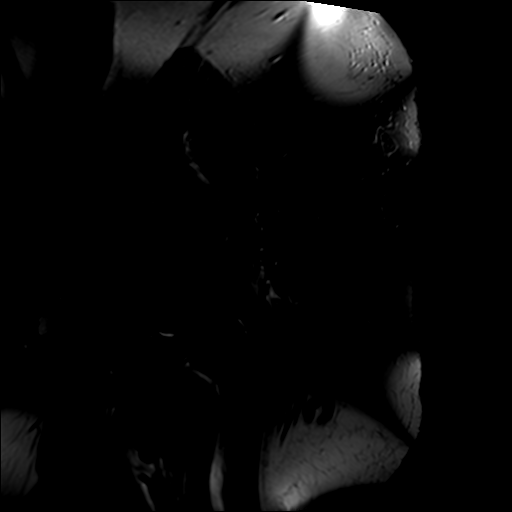
[im 53/53]
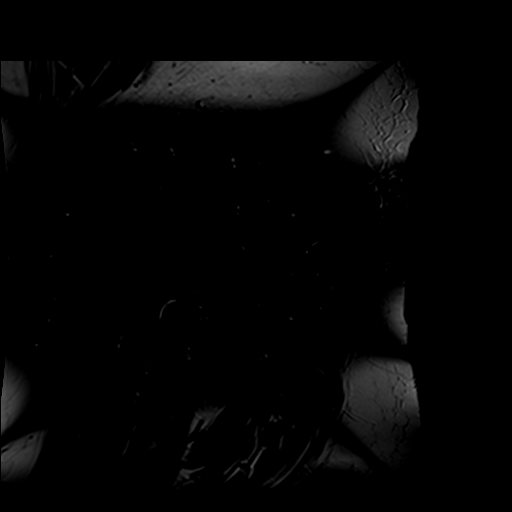

[Series 8: T1 fat-sat · axial · non-contrast · 4.0mm · 1.25mm/px · 1 of 50 slices shown]
[im 1/50]
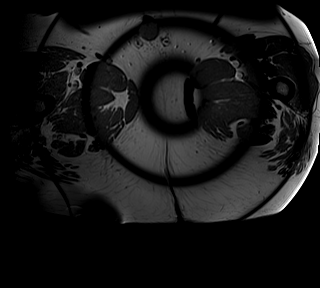

[31 of 48 positions shown; findings below may reference images not displayed]

FINDINGS: Musculoskeletal: Small deep midline sacral decubitus ulcer extending
to bone at the sacrococcygeal junction. Prominent surrounding soft
tissue thickening with patchy enhancement. Bony destruction of the
proximal coccyx with associated marrow edema and enhancement. No rim
enhancing fluid collection.

Urinary Tract: No abnormality visualized. Mild circumferential
bladder wall thickening, likely related to underdistension.

Bowel:  Unremarkable visualized pelvic bowel loops.

Vascular/Lymphatic: No pathologically enlarged lymph nodes. No
significant vascular abnormality seen.

Reproductive:  Prostate and seminal vesicles are unremarkable.

Other:  None.
IMPRESSION: 1. Small deep midline sacral decubitus ulcer extending to bone with
osteomyelitis of the proximal coccyx and surrounding local soft
tissue infection. No abscess.

## 2020-10-03 MED ORDER — GADOBENATE DIMEGLUMINE 529 MG/ML IV SOLN
20.0000 mL | Freq: Once | INTRAVENOUS | Status: AC | PRN
  Administered 2020-10-03: 20 mL via INTRAVENOUS

## 2020-10-03 NOTE — Telephone Encounter (Signed)
I called pt. I advised pt that Dr. Vickey Huger reviewed their sleep study results and found that pt has severe OSA. Dr. Vickey Huger recommends that pt starts CPAP at a pressure. I reviewed PAP compliance expectations with the pt. Pt is agreeable to starting a CPAP. I advised pt that an order will be sent to a DME, [employer], and [employer] will call the pt within about one week after they file with the pt's insurance. [employer] will show the pt how to use the machine, fit for masks, and troubleshoot the CPAP if needed. A follow up appt will need to be made for insurance purposes with Dr. Vickey Huger or NP. Pt verbalized understanding to arrive 15 minutes early and bring their CPAP. A letter with all of this information in it will be sent to the pt as a reminder through my chart. Pt verbalized understanding of results. Pt had no questions at this time but was encouraged to call back if questions arise. I have sent the order to [employer] and have received confirmation that they have received the order.

## 2020-10-03 NOTE — Telephone Encounter (Signed)
-----   Message from Melvyn Novas, MD sent at 10/02/2020  6:20 PM EST ----- POLYSOMNOGRAPHY IMPRESSION:  Severe hypoventilation with hypopneas creating an AHI of 50.4/h and all sleep non supine.  Diagnosis of Obstructive Sleep Apnea and Obesity Hypoventilation, based on 52 minutes of hypoxemia at nadir of SpO2 81%.    RECOMMENDATIONS: The use of CPAP at only 7 cm water corrected most of the obstructive hypoventilation events but hypoxia was still noted, The patient will continue to use CPAP at a new setting of 7 cm water pressure without EPR and wear an overnight pulsoximetry one night to see if hypoxia is sufficiently addressed.  A follow up appointment will be scheduled in the Sleep Clinic at [facility].     I certify that I have reviewed the entire raw data recording prior to the issuance of this report in accordance with the Standards of Accreditation of the American Academy of Sleep Medicine (AASM)

## 2020-10-05 ENCOUNTER — Other Ambulatory Visit: Payer: Self-pay

## 2020-10-05 ENCOUNTER — Encounter (HOSPITAL_BASED_OUTPATIENT_CLINIC_OR_DEPARTMENT_OTHER): Payer: BC Managed Care – PPO | Admitting: Internal Medicine

## 2020-10-05 DIAGNOSIS — E11622 Type 2 diabetes mellitus with other skin ulcer: Secondary | ICD-10-CM | POA: Diagnosis not present
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
## 2020-10-05 NOTE — Progress Notes (Addendum)
ZACHARIAH, PAVEK ([id number]) Visit Report for 10/05/2020 Arrival Information Details Patient Name: Date of Service: Collin Brown, [place] M. 10/05/2020 1:00 PM Medical Record Number: [id number] Patient Account Number: [id number] Date of Birth/Sex: Treating RN: [birth year] (35 y.o. [name] Primary Care Provider: Laverna Peace Other Clinician: Referring Provider: Treating Provider/Extender: Hessie Dibble, [name] Weeks in Treatment: 7 Visit Information History Since Last Visit Added or deleted any medications: No Patient Arrived: Ambulatory Any new allergies or adverse reactions: No Arrival Time: 13:18 Had a fall or experienced change in No Accompanied By: mother activities of daily living that may affect Transfer Assistance: None risk of falls: Patient Identification Verified: Yes Signs or symptoms of abuse/neglect since last visito No Secondary Verification Process Completed: Yes Hospitalized since last visit: No Patient Requires Transmission-Based Precautions: No Implantable device outside of the clinic excluding No Patient Has Alerts: No cellular tissue based products placed in the center since last visit: Has Dressing in Place as Prescribed: Yes Brown Present Now: Yes Electronic Signature(s) Signed: 10/05/2020 1:28:50 PM By: Sandre Kitty Entered By: Sandre Kitty on 10/05/2020 13:19:09 -------------------------------------------------------------------------------- Clinic Level of Care Assessment Details Patient Name: Date of Service: Collin Brown, [place] M. 10/05/2020 1:00 PM Medical Record Number: [id number] Patient Account Number: [id number] Date of Birth/Sex: Treating RN: [birth year] (35 y.o. [name] Primary Care Provider: Laverna Peace Other Clinician: Referring Provider: Treating Provider/Extender: Hessie Dibble, [name] Weeks in Treatment: 7 Clinic Level of Care Assessment Items TOOL 4 Quantity Score [] - 0 Use  when only an EandM is performed on FOLLOW-UP visit ASSESSMENTS - Nursing Assessment / Reassessment X- 1 10 Reassessment of Co-morbidities (includes updates in patient status) X- 1 5 Reassessment of Adherence to Treatment Plan ASSESSMENTS - Wound and Skin A ssessment / Reassessment X - Simple Wound Assessment / Reassessment - one wound 1 5 [] - 0 Complex Wound Assessment / Reassessment - multiple wounds [] - 0 Dermatologic / Skin Assessment (not related to wound area) ASSESSMENTS - Focused Assessment [] - 0 Circumferential Edema Measurements - multi extremities [] - 0 Nutritional Assessment / Counseling / Intervention [] - 0 Lower Extremity Assessment (monofilament, tuning fork, pulses) [] - 0 Peripheral Arterial Disease Assessment (using hand held doppler) ASSESSMENTS - Ostomy and/or Continence Assessment and Care [] - 0 Incontinence Assessment and Management [] - 0 Ostomy Care Assessment and Management (repouching, etc.) PROCESS - Coordination of Care X - Simple Patient / Family Education for ongoing care 1 15 [] - 0 Complex (extensive) Patient / Family Education for ongoing care X- 1 10 Staff obtains Programmer, systems, Records, T Results / Process Orders est X- 1 10 Staff telephones HHA, Nursing Homes / Clarify orders / etc [] - 0 Routine Transfer to another Facility (non-emergent condition) [] - 0 Routine Hospital Admission (non-emergent condition) [] - 0 New Admissions / Biomedical engineer / Ordering NPWT Apligraf, etc. , [] - 0 Emergency Hospital Admission (emergent condition) X- 1 10 Simple Discharge Coordination [] - 0 Complex (extensive) Discharge Coordination PROCESS - Special Needs [] - 0 Pediatric / Minor Patient Management [] - 0 Isolation Patient Management [] - 0 Hearing / Language / Visual special needs [] - 0 Assessment of Community assistance (transportation, D/C planning, etc.) [] - 0 Additional assistance / Altered mentation [] - 0 Support  Surface(s) Assessment (bed, cushion, seat, etc.) INTERVENTIONS - Wound Cleansing / Measurement X - Simple Wound Cleansing - one wound 1 5 [] - 0 Complex Wound Cleansing - multiple wounds  X- 1 5 Wound Imaging (photographs - any number of wounds) [] - 0 Wound Tracing (instead of photographs) X- 1 5 Simple Wound Measurement - one wound [] - 0 Complex Wound Measurement - multiple wounds INTERVENTIONS - Wound Dressings X - Small Wound Dressing one or multiple wounds 1 10 [] - 0 Medium Wound Dressing one or multiple wounds [] - 0 Large Wound Dressing one or multiple wounds X- 1 5 Application of Medications - topical [] - 0 Application of Medications - injection INTERVENTIONS - Miscellaneous [] - 0 External ear exam [] - 0 Specimen Collection (cultures, biopsies, blood, body fluids, etc.) [] - 0 Specimen(s) / Culture(s) sent or taken to Lab for analysis [] - 0 Patient Transfer (multiple staff / Civil Service fast streamer / Similar devices) [] - 0 Simple Staple / Suture removal (25 or less) [] - 0 Complex Staple / Suture removal (26 or more) [] - 0 Hypo / Hyperglycemic Management (close monitor of Blood Glucose) [] - 0 Ankle / Brachial Index (ABI) - do not check if billed separately X- 1 5 Vital Signs Has the patient been seen at the hospital within the last three years: Yes Total Score: 100 Level Of Care: New/Established - Level 3 Electronic Signature(s) Signed: 10/05/2020 4:54:48 PM By: Baruch Gouty RN, BSN Entered By: Baruch Gouty on 10/05/2020 13:43:25 -------------------------------------------------------------------------------- Encounter Discharge Information Details Patient Name: Date of Service: Collin Pain, [name] 10/05/2020 1:00 PM Medical Record Number: [id number] Patient Account Number: [id number] Date of Birth/Sex: Treating RN: 05/07/1986 (35 y.o. [name] Primary Care Provider: Laverna Peace Other Clinician: Referring Provider: Treating  Provider/Extender: Hessie Dibble, [name] Weeks in Treatment: 7 Encounter Discharge Information Items Discharge Condition: Stable Ambulatory Status: Ambulatory Discharge Destination: Home Transportation: Private Auto Accompanied By: mother Schedule Follow-up Appointment: Yes Clinical Summary of Care: Electronic Signature(s) Signed: 10/05/2020 5:07:55 PM By: Deon Pilling Entered By: Deon Pilling on 10/05/2020 17:03:16 -------------------------------------------------------------------------------- Multi Wound Chart Details Patient Name: Date of Service: Collin Pain, [name] 10/05/2020 1:00 PM Medical Record Number: [id number] Patient Account Number: [id number] Date of Birth/Sex: Treating RN: [birth year] (35 y.o. [name] Primary Care Provider: Laverna Peace Other Clinician: Referring Provider: Treating Provider/Extender: Hessie Dibble, [name] Weeks in Treatment: 7 Vital Signs Height(in): 70 Pulse(bpm): 85 Weight(lbs): 341 Blood Pressure(mmHg): 115/76 Body Mass Index(BMI): 49 Temperature(F): 97.8 Respiratory Rate(breaths/min): 17 Photos: [1:No Photos Sacrum] [N/A:N/A N/A] Wound Location: [1:Pressure Injury] [N/A:N/A] Wounding Event: [1:Pressure Ulcer] [N/A:N/A] Primary Etiology: [1:Anemia, Sleep Apnea, Hypertension,] [N/A:N/A] Comorbid History: [1:Type II Diabetes 05/18/2020] [N/A:N/A] Date Acquired: [1:7] [N/A:N/A] Weeks of Treatment: [1:Open] [N/A:N/A] Wound Status: [1:1x2.1x5.5] [N/A:N/A] Measurements L x W x D (cm) [1:1.649] [N/A:N/A] A (cm) : rea [1:9.071] [N/A:N/A] Volume (cm) : [1:-15.40%] [N/A:N/A] % Reduction in A rea: [1:-41.00%] [N/A:N/A] % Reduction in Volume: [1:Category/Stage IV] [N/A:N/A] Classification: [1:Large] [N/A:N/A] Exudate A mount: [1:Serosanguineous] [N/A:N/A] Exudate Type: [1:red, brown] [N/A:N/A] Exudate Color: [1:Well defined, not attached] [N/A:N/A] Wound Margin: [1:Large (67-100%)]  [N/A:N/A] Granulation A mount: [1:Pink] [N/A:N/A] Granulation Quality: [1:None Present (0%)] [N/A:N/A] Necrotic A mount: [1:Fat Layer (Subcutaneous Tissue): Yes N/A] Exposed Structures: [1:Fascia: No Tendon: No Muscle: No Joint: No Bone: No None] [N/A:N/A] Treatment Notes Electronic Signature(s) Signed: 10/05/2020 4:32:00 PM By: Linton Ham MD Signed: 10/05/2020 4:54:48 PM By: Baruch Gouty RN, BSN Entered By: Linton Ham on 10/05/2020 13:54:57 -------------------------------------------------------------------------------- Multi-Disciplinary Care Plan Details Patient Name: Date of Service: Collin Pain, [name] 10/05/2020 1:00 PM Medical Record Number: [id number] Patient Account Number: [id number] Date of Birth/Sex:  Treating RN: Aug 05, 1986 (35 y.o. [name] Primary Care Provider: Laverna Peace Other Clinician: Referring Provider: Treating Provider/Extender: Effie Shy Weeks in Treatment: 7 Multidisciplinary Care Plan reviewed with physician Active Inactive Nutrition Nursing Diagnoses: Potential for alteratiion in Nutrition/Potential for imbalanced nutrition Goals: Patient/caregiver agrees to and verbalizes understanding of need to obtain nutritional consultation Date Initiated: 08/16/2020 Date Inactivated: 09/14/2020 Target Resolution Date: 09/14/2020 Goal Status: Met Patient/caregiver verbalizes understanding of need to maintain therapeutic glucose control per primary care physician Date Initiated: 08/16/2020 Target Resolution Date: 11/02/2020 Goal Status: Active Interventions: Assess HgA1c results as ordered upon admission and as needed Provide education on elevated blood sugars and impact on wound healing Provide education on nutrition Treatment Activities: Obtain HgA1c : 08/16/2020 Patient referred to Primary Care Physician for further nutritional evaluation : 08/16/2020 Notes: Osteomyelitis Nursing Diagnoses: Infection:  osteomyelitis Knowledge deficit related to disease process and management Goals: Patient's osteomyelitis will resolve Date Initiated: 10/05/2020 Target Resolution Date: 11/02/2020 Goal Status: Active Interventions: Assess for signs and symptoms of osteomyelitis resolution every visit Provide education on osteomyelitis Treatment Activities: Systemic antibiotics : 10/05/2020 Notes: Pressure Nursing Diagnoses: Knowledge deficit related to management of pressures ulcers Potential for impaired tissue integrity related to pressure, friction, moisture, and shear Goals: Patient will remain free from development of additional pressure ulcers Date Initiated: 08/16/2020 Date Inactivated: 09/14/2020 Target Resolution Date: 09/14/2020 Goal Status: Met Patient/caregiver will verbalize understanding of pressure ulcer management Date Initiated: 08/16/2020 Target Resolution Date: 10/12/2020 Goal Status: Active Interventions: Assess: immobility, friction, shearing, incontinence upon admission and as needed Assess potential for pressure ulcer upon admission and as needed Provide education on pressure ulcers Treatment Activities: Consult for HBO : 08/16/2020 Pressure reduction/relief device ordered : 08/16/2020 T ordered outside of clinic : 08/16/2020 est Notes: Wound/Skin Impairment Nursing Diagnoses: Knowledge deficit related to ulceration/compromised skin integrity Goals: Patient/caregiver will verbalize understanding of skin care regimen Date Initiated: 08/16/2020 Target Resolution Date: 10/12/2020 Goal Status: Active Interventions: Assess patient/caregiver ability to perform ulcer/skin care regimen upon admission and as needed Assess ulceration(s) every visit Provide education on ulcer and skin care Treatment Activities: Skin care regimen initiated : 08/16/2020 Topical wound management initiated : 08/16/2020 Notes: Electronic Signature(s) Signed: 10/05/2020 4:54:48 PM By: Baruch Gouty RN, BSN Entered  By: Baruch Gouty on 10/05/2020 13:52:22 -------------------------------------------------------------------------------- Brown Assessment Details Patient Name: Date of Service: Collin Pain, [name] 10/05/2020 1:00 PM Medical Record Number: [id number] Patient Account Number: [id number] Date of Birth/Sex: Treating RN: 1985/10/19 (35 y.o. [name] Primary Care Provider: Laverna Peace Other Clinician: Referring Provider: Treating Provider/Extender: Hessie Dibble, [name] Weeks in Treatment: 7 Active Problems Location of Brown Severity and Description of Brown Patient Has Paino Yes Site Locations Rate the Brown. Current Brown Level: 3 Brown Management and Medication Current Brown Management: Electronic Signature(s) Signed: 10/05/2020 1:28:50 PM By: Sandre Kitty Signed: 10/05/2020 4:54:48 PM By: Baruch Gouty RN, BSN Entered By: Sandre Kitty on 10/05/2020 13:20:26 -------------------------------------------------------------------------------- Patient/Caregiver Education Details Patient Name: Date of Service: Collin Pain, [name] 2/25/2022andnbsp1:00 PM Medical Record Number: [id number] Patient Account Number: [id number] Date of Birth/Gender: Treating RN: April 28, 1986 (35 y.o. [name] Primary Care Physician: Laverna Peace Other Clinician: Referring Physician: Treating Physician/Extender: Hessie Dibble, [name] Weeks in Treatment: 7 Education Assessment Education Provided To: Patient Education Topics Provided Infection: Methods: Explain/Verbal Responses: Reinforcements needed, State content correctly Pressure: Methods: Explain/Verbal Responses: Reinforcements needed, State content correctly Wound/Skin Impairment: Methods: Explain/Verbal Responses: Reinforcements needed, State content correctly Electronic Signature(s) Signed: 10/05/2020 4:54:48  PM By: Baruch Gouty RN, BSN Entered By: Baruch Gouty on 10/05/2020  13:42:14 -------------------------------------------------------------------------------- Wound Assessment Details Patient Name: Date of Service: Collin Pain, [name] 10/05/2020 1:00 PM Medical Record Number: [id number] Patient Account Number: [id number] Date of Birth/Sex: Treating RN: 29-Oct-1985 (35 y.o. [name] Primary Care Provider: Laverna Peace Other Clinician: Referring Provider: Treating Provider/Extender: Hessie Dibble, [name] Weeks in Treatment: 7 Wound Status Wound Number: 1 Primary Etiology: Pressure Ulcer Wound Location: Sacrum Wound Status: Open Wounding Event: Pressure Injury Comorbid History: Anemia, Sleep Apnea, Hypertension, Type II Diabetes Date Acquired: 05/18/2020 Weeks Of Treatment: 7 Clustered Wound: No Photos Wound Measurements Length: (cm) 1 Width: (cm) 2.1 Depth: (cm) 5.5 Area: (cm) 1.649 Volume: (cm) 9.071 % Reduction in Area: -15.4% % Reduction in Volume: -41% Epithelialization: None Tunneling: No Undermining: No Wound Description Classification: Category/Stage IV Wound Margin: Well defined, not attached Exudate Amount: Large Exudate Type: Serosanguineous Exudate Color: red, brown Foul Odor After Cleansing: No Slough/Fibrino No Wound Bed Granulation Amount: Large (67-100%) Exposed Structure Granulation Quality: Pink Fascia Exposed: No Necrotic Amount: None Present (0%) Fat Layer (Subcutaneous Tissue) Exposed: Yes Tendon Exposed: No Muscle Exposed: No Joint Exposed: No Bone Exposed: No Treatment Notes Wound #1 (Sacrum) Cleanser Normal Saline Discharge Instruction: Cleanse the wound with Normal Saline prior to applying a clean dressing using gauze sponges, not tissue or cotton balls. Normal Saline Discharge Instruction: Cleanse the wound with Normal Saline prior to applying a clean dressing using gauze sponges, not tissue or cotton balls. Peri-Wound Care Skin Prep Discharge Instruction: Use skin prep  as directed Topical Primary Dressing KerraCel Ag Gelling Fiber Dressing, 4x5 in (silver alginate) Discharge Instruction: Apply silver alginate packed lightly into wound Secondary Dressing ComfortFoam Border, 6x6 in (silicone border) Discharge Instruction: Apply over primary dressing as directed. Secured With Compression Wrap Compression Stockings Environmental education officer) Signed: 10/05/2020 4:34:48 PM By: Sandre Kitty Signed: 10/05/2020 4:54:48 PM By: Baruch Gouty RN, BSN Previous Signature: 10/05/2020 1:28:50 PM Version By: Sandre Kitty Entered By: Sandre Kitty on 10/05/2020 15:52:47 -------------------------------------------------------------------------------- Vitals Details Patient Name: Date of Service: Collin Pain, [name] 10/05/2020 1:00 PM Medical Record Number: [id number] Patient Account Number: [id number] Date of Birth/Sex: Treating RN: January 15, 1986 (35 y.o. [name] Primary Care Provider: Laverna Peace Other Clinician: Referring Provider: Treating Provider/Extender: Hessie Dibble, [name] Weeks in Treatment: 7 Vital Signs Time Taken: 13:19 Temperature (F): 97.8 Height (in): 70 Pulse (bpm): 85 Weight (lbs): 341 Respiratory Rate (breaths/min): 17 Body Mass Index (BMI): 48.9 Blood Pressure (mmHg): 115/76 Reference Range: 80 - 120 mg / dl Electronic Signature(s) Signed: 10/05/2020 1:28:50 PM By: Sandre Kitty Entered By: Sandre Kitty on 10/05/2020 13:19:53

## 2020-10-08 ENCOUNTER — Telehealth: Payer: Self-pay

## 2020-10-08 NOTE — Telephone Encounter (Signed)
Chart reviewed per protocol: Verbal given to continue Occupational Therapy twice a week for three weeks. Order given to Tiffany with [facility] (ph [phone], #2)

## 2020-10-08 NOTE — Addendum Note (Signed)
Addended by: Melvyn Novas on: 10/08/2020 12:01 PM   Modules accepted: Orders

## 2020-10-08 NOTE — Progress Notes (Signed)
RECOMMENDATIONS: The patient will continue to use CPAP at a new  setting of 7 cm water pressure without EPR and wear an overnight  pulsoximetry one night to see if hypoxia is sufficiently  addressed.   We placed the order- all turn key. ! A follow up appointment will be scheduled in the Sleep Clinic at  [facility].

## 2020-10-10 ENCOUNTER — Ambulatory Visit: Payer: BC Managed Care – PPO | Admitting: Physical Medicine and Rehabilitation

## 2020-10-10 NOTE — Telephone Encounter (Signed)
Verbal okay for Home Health

## 2020-10-11 ENCOUNTER — Encounter: Payer: Self-pay | Admitting: Physical Medicine and Rehabilitation

## 2020-10-11 ENCOUNTER — Encounter
Payer: BC Managed Care – PPO | Attending: Physical Medicine and Rehabilitation | Admitting: Physical Medicine and Rehabilitation

## 2020-10-11 ENCOUNTER — Telehealth: Payer: Self-pay

## 2020-10-11 ENCOUNTER — Other Ambulatory Visit: Payer: Self-pay

## 2020-10-11 VITALS — BP 123/79 | HR 95 | Temp 98.7°F | Ht 70.0 in | Wt 365.0 lb

## 2020-10-11 DIAGNOSIS — G90512 Complex regional pain syndrome I of left upper limb: Secondary | ICD-10-CM | POA: Diagnosis present

## 2020-10-11 DIAGNOSIS — Z5181 Encounter for therapeutic drug level monitoring: Secondary | ICD-10-CM | POA: Diagnosis present

## 2020-10-11 DIAGNOSIS — G540 Brachial plexus disorders: Secondary | ICD-10-CM | POA: Diagnosis present

## 2020-10-11 DIAGNOSIS — L89154 Pressure ulcer of sacral region, stage 4: Secondary | ICD-10-CM | POA: Insufficient documentation

## 2020-10-11 DIAGNOSIS — G894 Chronic pain syndrome: Secondary | ICD-10-CM | POA: Diagnosis present

## 2020-10-11 DIAGNOSIS — Z79891 Long term (current) use of opiate analgesic: Secondary | ICD-10-CM | POA: Diagnosis present

## 2020-10-11 DIAGNOSIS — G7281 Critical illness myopathy: Secondary | ICD-10-CM | POA: Insufficient documentation

## 2020-10-11 MED ORDER — GABAPENTIN 800 MG PO TABS: 800.0000 mg | ORAL_TABLET | Freq: Four times a day (QID) | ORAL | 1 refills | Status: DC | PRN

## 2020-10-11 NOTE — Progress Notes (Signed)
Subjective:    Patient ID: Collin Brown, male    DOB: July 08, 1986, 35 y.o.   MRN: [id number]  HPI: Collin Brown is a 35 y.o. 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 Collin Brown presented to [facility] on 05/03/2020 with SOB and generalized weakness and fever for a few days. Patient presented to ED after being positive for COVID-19, he reported he thinks he was exposed to his Coworker who was COVID positive, however his mother had tested positive as well per ED note from Dr Allena Katz.  He was started on COVID protocol with remdesivir.   Collin Brown had a complicated hospital stay, see discharge summaries for details.   Neurology was consulted for left upper extremity weakness, he received steroids per Discharge summary.   DG Chest:  IMPRESSION: Diffuse bilateral ground-glass airspace opacities consistent with the patient's history of viral pneumonia  Collin Brown was admitted to inpatient rehabilitation on 06/08/2020.   Collin Brown was noted  with sacral wound stage 4, wound care was following. He has a scheduled appointment with wound care.   Collin Brown was discharged on 07/13/2020, he is receiving Home Health therapy from [facility]. He states his pain is located in his  lower back and sacral wound. He rates his pain 5. He reports he is walking with walker in his home to transfer from chair to wheelchair.   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 Pain is on average 7/10, pain right now is 5/10. His sensation has returned in his arm with the exception of his pinky. He has the sweetest OT. He has been trying to switch between Tylenol and Ibuprofen. He continues on Gabapentin. He has been taking 1200mg  threes times per  day. The pain feels sharp, stabbing, and tingling. Collin Brown Morphine equivalent is 60.00 MME.  Oral Swab was Performed previously. Pain contract signed previously.  -His pain is much better controlled but he does not feel ready to wean off Oxycodone yet. He thinks he will be able to next visit. He would like to get completely off of it. He does not like to be on medication in general.  -His pain is worse in his left arm and sacral pressure injury. -The neuropathic pain in his left arm has improved and the Gabapentin does not make him sleepy.  -He hopes to be able to return to work soon and his been asking if there are alternative positions he can take at his prior job.  2) Sacral pressure injury: He has been following with wound care. He is seeing orthopedics on Wednesday. They ar every pleased with nursing and physician wound care.  -Unfortunately he developed a tailbone infection since last visit and may need to start on IV antibiotics.  -This pain can also be severe at times.    Collin Brown mother in room and all questions answered.   Pain Inventory Average Pain 4 Pain Right Now 4 My pain is sharp, burning and stabbing  In the last 24 hours, has pain interfered with the following? General activity 5 Relation with others 2 Enjoyment of life 5 What TIME of day is your pain at its worst? night Sleep (in general) Poor  Pain is worse with: bending and inactivity Pain improves with: rest and  medication Relief from Meds: 9          Family History  Problem Relation Age of Onset  . Diabetes Mellitus II Mother   . Hypertension Mother   . Diabetes Mother   . Diabetes Mellitus II Father   . High blood pressure Father   . Heart attack Father    Social History   Socioeconomic History  . Marital status: Single    Spouse name: Not on file  . Number of children: Not on file  . Years of education: Not on file  . Highest education level: Not on file  Occupational History  .  Occupation: Risk manager  Tobacco Use  . Smoking status: Never Smoker  . Smokeless tobacco: Never Used  Vaping Use  . Vaping Use: Never used  Substance and Sexual Activity  . Alcohol use: No  . Drug use: No  . Sexual activity: Yes    Birth control/protection: None  Other Topics Concern  . Not on file  Social History Narrative  . Not on file   Social Determinants of Health   Financial Resource Strain: Not on file  Food Insecurity: Not on file  Transportation Needs: Not on file  Physical Activity: Not on file  Stress: Not on file  Social Connections: Not on file   Past Surgical History:  Procedure Laterality Date  . TONSILLECTOMY    . WOUND DEBRIDEMENT N/A 06/29/2020   Procedure: DEBRIDEMENT SACRAL WOUND;  Surgeon: Axel Filler, MD;  Location: [facility] OR;  Service: General;  Laterality: N/A;   Past Medical History:  Diagnosis Date  . Acute blood loss anemia   . Critical illness myopathy   . Diabetes mellitus without complication (HCC)   . Dyspnea 08/23/2020  . Hearing loss in right ear   . Hypertension   . Sleep apnea    BP 123/79   Pulse 95   Temp 98.7 F (37.1 C)   Ht 5\' 10"  (1.778 m)   Wt (!) 365 lb (165.6 kg)   SpO2 92%   BMI 52.37 kg/m   Opioid Risk Score:   Fall Risk Score:  `1  Depression screen PHQ 2/9  Depression screen [facility] 2/9 09/07/2020 08/17/2020 07/19/2020  Decreased Interest 0 1 2  Down, Depressed, Hopeless 0 1 0  PHQ - 2 Score 0 2 2  Altered sleeping - 2 3  Tired, decreased energy - 2 2  Change in appetite - 0 1  Feeling bad or failure about yourself  - 1 1  Trouble concentrating - 0 0  Moving slowly or fidgety/restless - 0 0  Suicidal thoughts - 0 0  PHQ-9 Score - 7 9  Difficult doing work/chores - Extremely dIfficult Very difficult    Review of Systems  Constitutional: Negative.   HENT: Negative.   Eyes: Negative.   Respiratory: Negative.   Cardiovascular: Negative.   Gastrointestinal: Negative.   Endocrine: Negative.    Genitourinary: Negative.   Musculoskeletal: Positive for myalgias.  Skin: Positive for wound.  Allergic/Immunologic: Negative.   Neurological: Negative.   Hematological: Negative.   Psychiatric/Behavioral: Negative.   All other systems reviewed and are negative.      Objective:    Physical Exam Gen: no distress, normal appearing HEENT: oral mucosa pink and moist, NCAT Cardio: Reg rate Chest: normal effort, normal rate of breathing Abd: soft, non-distended Ext: no edema Musculoskeletal:     Cervical back: Normal range of motion and neck supple.     Comments: Normal Muscle Bulk  and Muscle Testing Reveals:  Upper Extremities: Right: Full ROM and Muscle Strength 5/5 Left Upper Extremity: Decreased ROM and Muscle Strength 0/5 in shoulder abduction, 2/5 EF and EE, 0/5 WE and hand grip. Tremor in left hand.  Lumbar Hypersensitivity Lower Extremities: Decreased ROM and Muscle Strength 4/5 Able to walk without device.  Skin:    General: Skin is warm and dry.     Comments: Sacral Dressing Intact, wound vac in place.  Neurological:     Mental Status: He is alert and oriented to person, place, and time.  Psychiatric:        Mood and Affect: Mood normal.        Behavior: Behavior normal.     Assessment & Plan:  1.Critical Illness Myopathy: Continue PT and OT 2. 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 Continue to Monitor.  3. Essential Hypertension: Continue current medication regimen. Continue to monitor. He has a scheduled appointment with PCP.  4. Diabetes Mellitus New Onset.Continue current medication regimen and he has a scheduled appointment with PCP.  5. Sacral Wound: Continue [facility]: Wound Center. Due to recent infection, he may require IV abx. Continue oxycodone for pain control with plan to wean next visit with Riley Lam NP.  6. Left sided brachial plexus neuropathy: CT and MRI ordered and reviewed. [place] negative for  blood clots. Continue follow-up with orthopedics.  -Refer to pain center for sympathetic nerve block for CRPS.  -Refilled Gabapentin 800mg  up to 4 times per day. Advised to take an additional 600mg  tablet at night for pain/sleep. This puts him at may 3600mg . -Discussed current symptoms of pain and history of pain.  -Discussed benefits of exercise in reducing pain. -Discussed following foods that may reduce pain: 1) Ginger 2) Blueberries 3) Salmon 4) Pumpkin seeds 5) dark chocolate- he likes this in particular.  6) turmeric 7) tart cherries 8) virgin olive oil 9) chilli peppers 10) mint  Link to further information on diet for chronic pain:  Left humerus XR ordered and there is no evidence of fracture. MRI has been ordered.   We will continue the opioid monitoring program, this consists of regular clinic visits, examinations, urine drug screen, pill counts as well as use of Controlled Substance Reporting system. A 12 month History has been reviewed on the Controlled Substance Reporting System on 07/19/2020.   7. Nausea: Refilled Zofran.   8) Discussed and encouraged return to work in less physical duties.   F/U in 4 weeks with [place] NP.

## 2020-10-11 NOTE — Telephone Encounter (Signed)
Collin Brown OT has requested orders for a Child psychotherapist. To assist with cost of living. Verbal order given per protocol.  Call back phone [phone].

## 2020-10-11 NOTE — Patient Instructions (Addendum)
  Mama [employer]'s chocolate  Blue emu oil  -Discussed following foods that may reduce pain: 1) Ginger 2) Blueberries 3) Salmon 4) Pumpkin seeds 5) dark chocolate 6) turmeric 7) tart cherries 8) virgin olive oil 9) chilli peppers 10) mint 11) garlic  Link to further information on diet for chronic pain: [email or web address]

## 2020-10-12 ENCOUNTER — Encounter (HOSPITAL_BASED_OUTPATIENT_CLINIC_OR_DEPARTMENT_OTHER): Payer: BC Managed Care – PPO | Attending: Internal Medicine | Admitting: Physician Assistant

## 2020-10-12 DIAGNOSIS — I10 Essential (primary) hypertension: Secondary | ICD-10-CM | POA: Diagnosis not present

## 2020-10-12 DIAGNOSIS — L89154 Pressure ulcer of sacral region, stage 4: Secondary | ICD-10-CM | POA: Insufficient documentation

## 2020-10-12 DIAGNOSIS — E1151 Type 2 diabetes mellitus with diabetic peripheral angiopathy without gangrene: Secondary | ICD-10-CM | POA: Diagnosis present

## 2020-10-12 DIAGNOSIS — Z794 Long term (current) use of insulin: Secondary | ICD-10-CM | POA: Insufficient documentation

## 2020-10-12 DIAGNOSIS — E11622 Type 2 diabetes mellitus with other skin ulcer: Secondary | ICD-10-CM | POA: Insufficient documentation
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
## 2020-10-15 ENCOUNTER — Encounter: Payer: Self-pay | Admitting: Internal Medicine

## 2020-10-15 ENCOUNTER — Other Ambulatory Visit: Payer: Self-pay

## 2020-10-15 ENCOUNTER — Ambulatory Visit (INDEPENDENT_AMBULATORY_CARE_PROVIDER_SITE_OTHER): Payer: BC Managed Care – PPO | Admitting: Internal Medicine

## 2020-10-15 VITALS — BP 159/90 | HR 108 | Temp 98.0°F | Ht 71.0 in | Wt 366.0 lb

## 2020-10-15 DIAGNOSIS — Z79899 Other long term (current) drug therapy: Secondary | ICD-10-CM | POA: Diagnosis not present

## 2020-10-15 DIAGNOSIS — M4628 Osteomyelitis of vertebra, sacral and sacrococcygeal region: Secondary | ICD-10-CM

## 2020-10-15 DIAGNOSIS — E1169 Type 2 diabetes mellitus with other specified complication: Secondary | ICD-10-CM | POA: Diagnosis not present

## 2020-10-15 DIAGNOSIS — Z8616 Personal history of COVID-19: Secondary | ICD-10-CM

## 2020-10-15 HISTORY — DX: Osteomyelitis of vertebra, sacral and sacrococcygeal region: M46.28

## 2020-10-15 MED ORDER — METRONIDAZOLE 500 MG PO TABS: 500.0000 mg | ORAL_TABLET | Freq: Three times a day (TID) | ORAL | 0 refills | Status: AC

## 2020-10-15 NOTE — Assessment & Plan Note (Signed)
Will be on IV antibiotics for 6 weeks with weekly OPAT monitoring.

## 2020-10-15 NOTE — Addendum Note (Signed)
Addended by: Kathlynn Grate on: 10/15/2020 11:12 AM   Modules accepted: Orders

## 2020-10-15 NOTE — Addendum Note (Signed)
Addended by: Valarie Cones on: 10/15/2020 11:10 AM   Modules accepted: Orders

## 2020-10-15 NOTE — Assessment & Plan Note (Signed)
Last A1c was good.  He is continued on insulin and following with his PCP.

## 2020-10-15 NOTE — Progress Notes (Addendum)
Collin, Brown ([id number]) Visit Report for 10/12/2020 Arrival Information Details Patient Name: Date of Service: Collin Brown, [place] M. 10/12/2020 3:00 PM Medical Record Number: [id number] Patient Account Number: [id number] Date of Birth/Sex: Treating RN: 1985/08/20 (35 y.o. Collin Brown, [name] : Laverna Peace Other Clinician: Referring : Treating /Extender: Fredia Beets, [name] Weeks in Treatment: 8 Visit Information History Since Last Visit Added or deleted any medications: No Patient Arrived: Ambulatory Any new allergies or adverse reactions: No Arrival Time: 15:16 Had a fall or experienced change in No Accompanied By: self activities of daily living that may affect Transfer Assistance: None risk of falls: Patient Identification Verified: Yes Signs or symptoms of abuse/neglect since last visito No Secondary Verification Process Completed: Yes Hospitalized since last visit: No Patient Requires Transmission-Based Precautions: No Implantable device outside of the clinic excluding No Patient Has Alerts: No cellular tissue based products placed in the center since last visit: Has Dressing in Place as Prescribed: Yes Brown Present Now: Yes Electronic Signature(s) Signed: 10/15/2020 5:34:21 PM By: Rhae Hammock RN Entered By: Rhae Hammock on 10/12/2020 15:17:06 -------------------------------------------------------------------------------- Clinic Level of Care Assessment Details Patient Name: Date of Service: Collin Pain, [name] 10/12/2020 3:00 PM Medical Record Number: [id number] Patient Account Number: [id number] Date of Birth/Sex: Treating RN: Oct 02, 1985 (35 y.o. [name] Primary Care : Laverna Peace Other Clinician: Referring : Treating /Extender: Fredia Beets, [name] Weeks in Treatment: 8 Clinic Level of Care Assessment Items TOOL 4 Quantity Score [] - 0 Use  when only an EandM is performed on FOLLOW-UP visit ASSESSMENTS - Nursing Assessment / Reassessment X- 1 10 Reassessment of Co-morbidities (includes updates in patient status) X- 1 5 Reassessment of Adherence to Treatment Plan ASSESSMENTS - Wound and Skin A ssessment / Reassessment X - Simple Wound Assessment / Reassessment - one wound 1 5 [] - 0 Complex Wound Assessment / Reassessment - multiple wounds [] - 0 Dermatologic / Skin Assessment (not related to wound area) ASSESSMENTS - Focused Assessment [] - 0 Circumferential Edema Measurements - multi extremities [] - 0 Nutritional Assessment / Counseling / Intervention [] - 0 Lower Extremity Assessment (monofilament, tuning fork, pulses) [] - 0 Peripheral Arterial Disease Assessment (using hand held doppler) ASSESSMENTS - Ostomy and/or Continence Assessment and Care [] - 0 Incontinence Assessment and Management [] - 0 Ostomy Care Assessment and Management (repouching, etc.) PROCESS - Coordination of Care X - Simple Patient / Family Education for ongoing care 1 15 [] - 0 Complex (extensive) Patient / Family Education for ongoing care X- 1 10 Staff obtains Programmer, systems, Records, T Results / Process Orders est X- 1 10 Staff telephones HHA, Nursing Homes / Clarify orders / etc [] - 0 Routine Transfer to another Facility (non-emergent condition) [] - 0 Routine Hospital Admission (non-emergent condition) [] - 0 New Admissions / Biomedical engineer / Ordering NPWT Apligraf, etc. , [] - 0 Emergency Hospital Admission (emergent condition) X- 1 10 Simple Discharge Coordination [] - 0 Complex (extensive) Discharge Coordination PROCESS - Special Needs [] - 0 Pediatric / Minor Patient Management [] - 0 Isolation Patient Management [] - 0 Hearing / Language / Visual special needs [] - 0 Assessment of Community assistance (transportation, D/C planning, etc.) [] - 0 Additional assistance / Altered mentation [] - 0 Support  Surface(s) Assessment (bed, cushion, seat, etc.) INTERVENTIONS - Wound Cleansing / Measurement X - Simple Wound Cleansing - one wound 1 5 [] - 0 Complex Wound Cleansing -  multiple wounds X- 1 5 Wound Imaging (photographs - any number of wounds) [] - 0 Wound Tracing (instead of photographs) X- 1 5 Simple Wound Measurement - one wound [] - 0 Complex Wound Measurement - multiple wounds INTERVENTIONS - Wound Dressings X - Small Wound Dressing one or multiple wounds 1 10 [] - 0 Medium Wound Dressing one or multiple wounds [] - 0 Large Wound Dressing one or multiple wounds X- 1 5 Application of Medications - topical [] - 0 Application of Medications - injection INTERVENTIONS - Miscellaneous [] - 0 External ear exam [] - 0 Specimen Collection (cultures, biopsies, blood, body fluids, etc.) [] - 0 Specimen(s) / Culture(s) sent or taken to Lab for analysis [] - 0 Patient Transfer (multiple staff / Civil Service fast streamer / Similar devices) [] - 0 Simple Staple / Suture removal (25 or less) [] - 0 Complex Staple / Suture removal (26 or more) [] - 0 Hypo / Hyperglycemic Management (close monitor of Blood Glucose) [] - 0 Ankle / Brachial Index (ABI) - do not check if billed separately X- 1 5 Vital Signs Has the patient been seen at the hospital within the last three years: Yes Total Score: 100 Level Of Care: New/Established - Level 3 Electronic Signature(s) Signed: 10/12/2020 5:05:02 PM By: Baruch Gouty RN, BSN Entered By: Baruch Gouty on 10/12/2020 15:57:21 -------------------------------------------------------------------------------- Encounter Discharge Information Details Patient Name: Date of Service: Collin Pain, [name] 10/12/2020 3:00 PM Medical Record Number: [id number] Patient Account Number: [id number] Date of Birth/Sex: Treating RN: 1985-09-20 (35 y.o. Collin Brown, [name] : Laverna Peace Other Clinician: Referring : Treating  /Extender: Fredia Beets, [name] Weeks in Treatment: 8 Encounter Discharge Information Items Discharge Condition: Stable Ambulatory Status: Ambulatory Discharge Destination: Home Transportation: Private Auto Accompanied By: mother Schedule Follow-up Appointment: Yes Clinical Summary of Care: Electronic Signature(s) Signed: 10/12/2020 5:05:25 PM By: Deon Pilling Entered By: Deon Pilling on 10/12/2020 16:30:25 -------------------------------------------------------------------------------- Lower Extremity Assessment Details Patient Name: Date of Service: Collin Pain, [name] 10/12/2020 3:00 PM Medical Record Number: [id number] Patient Account Number: [id number] Date of Birth/Sex: Treating RN: [birth year] (35 y.o. Collin Brown, [name] : Laverna Peace Other Clinician: Referring : Treating /Extender: Fredia Beets, [name] Weeks in Treatment: 8 Electronic Signature(s) Signed: 10/15/2020 5:34:21 PM By: Rhae Hammock RN Entered By: Rhae Hammock on 10/12/2020 15:18:56 -------------------------------------------------------------------------------- [place] Details Patient Name: Date of Service: Collin Pain, [name] 10/12/2020 3:00 PM Medical Record Number: [id number] Patient Account Number: [id number] Date of Birth/Sex: Treating RN: 01-Oct-1985 (35 y.o. [name] Primary Care : Laverna Peace Other Clinician: Referring : Treating /Extender: Fredia Beets, [name] Weeks in Treatment: 8 Multidisciplinary Care Plan reviewed with physician Active Inactive Nutrition Nursing Diagnoses: Potential for alteratiion in Nutrition/Potential for imbalanced nutrition Goals: Patient/caregiver agrees to and verbalizes understanding of need to obtain nutritional consultation Date Initiated: 08/16/2020 Date Inactivated: 09/14/2020 Target Resolution Date:  09/14/2020 Goal Status: Met Patient/caregiver verbalizes understanding of need to maintain therapeutic glucose control per primary care physician Date Initiated: 08/16/2020 Target Resolution Date: 11/02/2020 Goal Status: Active Interventions: Assess HgA1c results as ordered upon admission and as needed Provide education on elevated blood sugars and impact on wound healing Provide education on nutrition Treatment Activities: Obtain HgA1c : 08/16/2020 Patient referred to Primary Care Physician for further nutritional evaluation : 08/16/2020 Notes: Osteomyelitis Nursing Diagnoses: Infection: osteomyelitis Knowledge deficit related to disease process and management Goals: Patient's osteomyelitis will resolve Date Initiated: 10/05/2020  Target Resolution Date: 11/02/2020 Goal Status: Active Interventions: Assess for signs and symptoms of osteomyelitis resolution every visit Provide education on osteomyelitis Treatment Activities: Systemic antibiotics : 10/05/2020 Notes: Pressure Nursing Diagnoses: Knowledge deficit related to management of pressures ulcers Potential for impaired tissue integrity related to pressure, friction, moisture, and shear Goals: Patient will remain free from development of additional pressure ulcers Date Initiated: 08/16/2020 Date Inactivated: 09/14/2020 Target Resolution Date: 09/14/2020 Goal Status: Met Patient/caregiver will verbalize understanding of pressure ulcer management Date Initiated: 08/16/2020 Target Resolution Date: 11/09/2020 Goal Status: Active Interventions: Assess: immobility, friction, shearing, incontinence upon admission and as needed Assess potential for pressure ulcer upon admission and as needed Provide education on pressure ulcers Treatment Activities: Consult for HBO : 08/16/2020 Pressure reduction/relief device ordered : 08/16/2020 T ordered outside of clinic : 08/16/2020 est Notes: Wound/Skin Impairment Nursing Diagnoses: Knowledge deficit  related to ulceration/compromised skin integrity Goals: Patient/caregiver will verbalize understanding of skin care regimen Date Initiated: 08/16/2020 Target Resolution Date: 11/09/2020 Goal Status: Active Interventions: Assess patient/caregiver ability to perform ulcer/skin care regimen upon admission and as needed Assess ulceration(s) every visit Provide education on ulcer and skin care Treatment Activities: Skin care regimen initiated : 08/16/2020 Topical wound management initiated : 08/16/2020 Notes: Electronic Signature(s) Signed: 10/12/2020 5:05:02 PM By: Baruch Gouty RN, BSN Entered By: Baruch Gouty on 10/12/2020 15:55:53 -------------------------------------------------------------------------------- Brown Assessment Details Patient Name: Date of Service: Collin Pain, [name] 10/12/2020 3:00 PM Medical Record Number: [id number] Patient Account Number: [id number] Date of Birth/Sex: Treating RN: [birth year] (35 y.o. Collin Brown, [name] : Laverna Peace Other Clinician: Referring : Treating /Extender: Fredia Beets, [name] Weeks in Treatment: 8 Active Problems Location of Brown Severity and Description of Brown Patient Has Paino Yes Site Locations Brown Location: Brown in Ulcers With Dressing Change: Yes Duration of the Brown. Constant / Intermittento Intermittent Rate the Brown. Current Brown Level: 2 Worst Brown Level: 10 Least Brown Level: 0 Tolerable Brown Level: 2 Character of Brown Describe the Brown: Aching Brown Management and Medication Current Brown Management: Medication: Yes Cold Application: No Rest: Yes Massage: No Activity: No T.E.N.S.: No Heat Application: No Leg drop or elevation: No Is the Current Brown Management Adequate: Adequate How does your wound impact your activities of daily livingo Sleep: No Bathing: No Appetite: No Relationship With Others: No Bladder Continence: No Emotions: No Bowel  Continence: No Work: No Toileting: No Drive: No Dressing: No Hobbies: No Electronic Signature(s) Signed: 10/15/2020 5:34:21 PM By: Rhae Hammock RN Entered By: Rhae Hammock on 10/12/2020 15:17:56 -------------------------------------------------------------------------------- Patient/Caregiver Education Details Patient Name: Date of Service: Collin Pain, MA TTHEW Jerilynn Mages 3/4/2022andnbsp3:00 PM Medical Record Number: [id number] Patient Account Number: [id number] Date of Birth/Gender: Treating RN: April 24, 1986 (35 y.o. [name] Primary Care Physician: Laverna Peace Other Clinician: Referring Physician: Treating Physician/Extender: Fredia Beets, [name] Weeks in Treatment: 8 Education Assessment Education Provided To: Patient Education Topics Provided Infection: Methods: Explain/Verbal Responses: Reinforcements needed, State content correctly Pressure: Methods: Explain/Verbal Responses: Reinforcements needed, State content correctly Wound/Skin Impairment: Methods: Explain/Verbal Responses: Reinforcements needed, State content correctly Electronic Signature(s) Signed: 10/12/2020 5:05:02 PM By: Baruch Gouty RN, BSN Entered By: Baruch Gouty on 10/12/2020 15:56:47 -------------------------------------------------------------------------------- Wound Assessment Details Patient Name: Date of Service: Collin Pain, [name] 10/12/2020 3:00 PM Medical Record Number: [id number] Patient Account Number: [id number] Date of Birth/Sex: Treating RN: 1986/05/17 (35 y.o. Collin Brown, [name] : Laverna Peace Other Clinician: Referring : Treating /Extender: Fredia Beets, [name] [place]  in Treatment: 8 Wound Status Wound Number: 1 Primary Etiology: Pressure Ulcer Wound Location: Sacrum Wound Status: Open Wounding Event: Pressure Injury Comorbid History: Anemia, Sleep Apnea, Hypertension, Type II  Diabetes Date Acquired: 05/18/2020 Weeks Of Treatment: 8 Clustered Wound: No Photos Wound Measurements Length: (cm) 0.8 Width: (cm) 0.9 Depth: (cm) 4.8 Area: (cm) 0.565 Volume: (cm) 2.714 % Reduction in Area: 60.5% % Reduction in Volume: 57.8% Epithelialization: None Tunneling: No Undermining: No Wound Description Classification: Category/Stage IV Wound Margin: Well defined, not attached Exudate Amount: Large Exudate Type: Serosanguineous Exudate Color: red, brown Foul Odor After Cleansing: No Slough/Fibrino No Wound Bed Granulation Amount: Large (67-100%) Exposed Structure Granulation Quality: Red, Pink Fascia Exposed: No Necrotic Amount: None Present (0%) Fat Layer (Subcutaneous Tissue) Exposed: Yes Tendon Exposed: No Muscle Exposed: No Joint Exposed: No Bone Exposed: No Treatment Notes Wound #1 (Sacrum) Cleanser Normal Saline Discharge Instruction: Cleanse the wound with Normal Saline prior to applying a clean dressing using gauze sponges, not tissue or cotton balls. Normal Saline Discharge Instruction: Cleanse the wound with Normal Saline prior to applying a clean dressing using gauze sponges, not tissue or cotton balls. Peri-Wound Care Skin Prep Discharge Instruction: Use skin prep as directed Topical Primary Dressing KerraCel Ag Gelling Fiber Dressing, 4x5 in (silver alginate) Discharge Instruction: Apply silver alginate packed lightly into wound Secondary Dressing ComfortFoam Border, 6x6 in (silicone border) Discharge Instruction: Apply over primary dressing as directed. Secured With Compression Wrap Compression Stockings Add-Ons Electronic Signature(s) Signed: 10/16/2020 11:11:03 AM By: Sandre Kitty Signed: 10/17/2020 5:36:09 PM By: Rhae Hammock RN Previous Signature: 10/15/2020 5:34:21 PM Version By: Rhae Hammock RN Entered By: Sandre Kitty on 10/16/2020  11:05:47 -------------------------------------------------------------------------------- Vitals Details Patient Name: Date of Service: Collin Pain, [name] 10/12/2020 3:00 PM Medical Record Number: [id number] Patient Account Number: [id number] Date of Birth/Sex: Treating RN: [birth year] (35 y.o. Collin Brown, [name] Dymin Dingledine: Laverna Peace Other Clinician: Referring Shabrea Weldin: Treating Esmeralda Malay/Extender: Fredia Beets, [name] Weeks in Treatment: 8 Vital Signs Time Taken: 15:18 Temperature (F): 98.2 Height (in): 70 Pulse (bpm): 89 Weight (lbs): 341 Respiratory Rate (breaths/min): 17 Body Mass Index (BMI): 48.9 Blood Pressure (mmHg): 120/72 Reference Range: 80 - 120 mg / dl Electronic Signature(s) Signed: 10/15/2020 5:34:21 PM By: Rhae Hammock RN Entered By: Rhae Hammock on 10/12/2020 15:18:39

## 2020-10-15 NOTE — Patient Instructions (Signed)
Thank you for coming to see me today. It was a pleasure seeing you.  To Do: [facility] today . Will get picc line scheduled and started on IV antibiotics for planned 6 weeks . Continue Augmentin until PICC placed and then can stop taking oral antibiotics and just do IV . Follow up with me in 4 weeks  If you have any questions or concerns, please do not hesitate to call the office at [phone].  Take Care,   Gwynn Burly, DO

## 2020-10-15 NOTE — Progress Notes (Addendum)
[facility] for Infectious Disease  Reason for Consult: Sacral OM  Referring Provider: Dr Dellia Nims   HPI:    Collin Brown is a 35 y.o. male with PMHx as below who presents to the clinic for sacral OM.   Patient was hospitalized from 05/03/20-06/08/20 with severe COVID pneumonia requiring mechanical ventilation.  Subsequently transferred from [facility] to Inpatient rehab 10/29-21-07/13/20.  Wound was first noted in October and required surgical debridement with Dr Rosendo Gros on 06/29/20.  Measurement was 3.5x6.4.    He also was diagnosed with DM 2 during this hospital admission.  Other hx includes OSA, HTN, obesity.  He has been following with the wound center (Dr Dellia Nims) for this wound and had an MRI done 10/03/20 that revealed sacral OM.  There is a wound cx from 09/14/20 that was negative for a predominant organism.   He has no hx of MDRO.   Currently on Augmentin since last Wednesday.  Patient's Medications  New Prescriptions   No medications on file  Previous Medications   AMOXICILLIN-CLAVULANATE (AUGMENTIN) 875-125 MG TABLET    amoxicillin 875 mg-potassium clavulanate 125 mg tablet  TAKE 1 TABLET BY MOUTH TWICE DAILY FOR 7 DAYS   ASCORBIC ACID (VITAMIN C) 500 MG TABLET    Take 1 tablet (500 mg total) by mouth daily.   ASPIRIN 81 MG CHEWABLE TABLET    Chew 1 tablet (81 mg total) by mouth daily.   BLOOD GLUCOSE METER KIT AND SUPPLIES    Dispense based on patient and insurance preference. Use up to four times daily as directed. (FOR ICD-10 E10.9, E11.9).   FLUTICASONE-SALMETEROL (ADVAIR DISKUS) 250-50 MCG/DOSE AEPB    Inhale 1 puff into the lungs 2 (two) times daily.   GABAPENTIN (NEURONTIN) 800 MG TABLET    Take 1 tablet (800 mg total) by mouth 4 (four) times daily as needed.   INSULIN LISPRO (HUMALOG KWIKPEN) 100 UNIT/ML KWIKPEN    Inject 4 Units into the skin 3 (three) times daily. Take with meals (as long as blood sugars are over 80)   INSULIN PEN NEEDLE (CAREFINE PEN NEEDLES) 32G  X 6 MM MISC    1 applicator by Does not apply route 4 (four) times daily -  with meals and at bedtime.   LANTUS SOLOSTAR 100 UNIT/ML SOLOSTAR PEN    Inject into the skin.   METOPROLOL TARTRATE (LOPRESSOR) 50 MG TABLET    Take 1.5 tablets (75 mg total) by mouth 2 (two) times daily.   MULTIPLE VITAMIN (MULTIVITAMIN WITH MINERALS) TABS TABLET    Take 1 tablet by mouth daily.   ONDANSETRON (ZOFRAN) 4 MG TABLET    Take 1 tablet (4 mg total) by mouth every 8 (eight) hours as needed for nausea or vomiting.   OXYCODONE-ACETAMINOPHEN (PERCOCET) 10-325 MG TABLET    Take 1 tablet by mouth every 6 (six) hours as needed for pain.   POTASSIUM CHLORIDE SA (KLOR-CON) 20 MEQ TABLET    Take 2 tablets (40 mEq total) by mouth 2 (two) times daily.   SENNA-DOCUSATE (SENOKOT-S) 8.6-50 MG TABLET    Take 2 tablets by mouth 2 (two) times daily.  Modified Medications   No medications on file  Discontinued Medications   No medications on file      Past Medical History:  Diagnosis Date  . Acute blood loss anemia   . Critical illness myopathy   . Diabetes mellitus without complication ([place])   . Dyspnea 08/23/2020  . Hearing loss in  right ear   . Hypertension   . Sleep apnea     Social History   Tobacco Use  . Smoking status: Never Smoker  . Smokeless tobacco: Never Used  Vaping Use  . Vaping Use: Never used  Substance Use Topics  . Alcohol use: No  . Drug use: No    Family History  Problem Relation Age of Onset  . Diabetes Mellitus II Mother   . Hypertension Mother   . Diabetes Mother   . Diabetes Mellitus II Father   . High blood pressure Father   . Heart attack Father     No Known Allergies  Review of Systems  Constitutional: Negative for fever.  Respiratory: Negative.   Cardiovascular: Negative.   Gastrointestinal: Negative.   Musculoskeletal: Negative for back pain.      OBJECTIVE:    Vitals:   10/15/20 0912  BP: (!) 159/90  Pulse: (!) 108  Temp: 98 F (36.7 C)  Weight: (!)  366 lb (166 kg)  Height: '5\' 11"'  (1.803 m)     Body mass index is 51.05 kg/m.  Physical Exam Constitutional:      Appearance: Normal appearance. He is obese.  HENT:     Head: Normocephalic and atraumatic.     Comments: Very pleasant man, no distress, accompanied by his mother.  Skin:    General: Skin is warm and dry.     Findings: No rash.  Neurological:     General: No focal deficit present.     Mental Status: He is alert and oriented to person, place, and time.  Psychiatric:        Mood and Affect: Mood normal.        Behavior: Behavior normal.      Labs and Microbiology:  CBC Latest Ref Rng & Units 08/23/2020 07/09/2020 07/01/2020  WBC 3.4 - 10.8 x10E3/uL 7.9 7.5 8.3  Hemoglobin 13.0 - 17.7 g/dL 13.4 11.5(L) 11.5(L)  Hematocrit 37.5 - 51.0 % 41.5 37.0(L) 36.5(L)  Platelets 150 - 450 x10E3/uL 294 339 385   CMP Latest Ref Rng & Units 08/23/2020 07/06/2020 06/28/2020  Glucose 65 - 99 mg/dL 108(H) 104(H) 114(H)  BUN 6 - 20 mg/dL '11 9 9  ' Creatinine 0.76 - 1.27 mg/dL 0.57(L) 0.56(L) 0.53(L)  Sodium 134 - 144 mmol/L 139 137 139  Potassium 3.5 - 5.2 mmol/L 4.7 4.0 4.0  Chloride 96 - 106 mmol/L 102 99 103  CO2 20 - 29 mmol/L '22 25 24  ' Calcium 8.7 - 10.2 mg/dL 10.0 9.6 9.4  Total Protein 6.0 - 8.5 g/dL 6.3 - 6.0(L)  Total Bilirubin 0.0 - 1.2 mg/dL 0.3 - 0.4  Alkaline Phos 44 - 121 IU/L 68 - 54  AST 0 - 40 IU/L 17 - 17  ALT 0 - 44 IU/L 26 - 24     No results found for this or any previous visit (from the past 240 hour(s)).  Imaging: IMPRESSION: 1. Small deep midline sacral decubitus ulcer extending to bone with osteomyelitis of the proximal coccyx and surrounding local soft tissue infection. No abscess.   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  No cultures data to go off of and no personal history of MDRO.  Will plan empiric coverage with daptomycin 1100 every 24  hrs (dosing at 70m/kg off adjusted BW given BMI greater than 40), ceftriaxone 2gm IV every 24 hrs, and metronidazole 5065mPO q8h.  Will check baseline labs today with CBC, CMP, ESR, CRP, CK.  OPAT orders to be placed.  PICC line ordered and will anticipate 6 weeks of therapy from PICC placement.  RTC 4 weeks.  Continue wound care and glycemic control.  Will continue with Augmentin until PICC line placed.  Diabetes mellitus (HCC) Last A1c was good.  He is continued on insulin and following with his PCP.   Encounter for long-term current use of medication Will be on IV antibiotics for 6 weeks with weekly OPAT monitoring.    Orders Placed This Encounter  Procedures  . [place] LEFT >5 YRS INC IMG GUIDE    Need for IV abx    Standing Status:   Future    Standing Expiration Date:   10/15/2021    Order Specific Question:   Reason for Exam (SYMPTOM  OR DIAGNOSIS REQUIRED)    Answer:   need for IV abx due to Osteo    Order Specific Question:   Preferred Imaging Location?    Answer:   [facility]  . CBC  . COMPLETE METABOLIC PANEL WITH GFR  . CK (Creatine Kinase)  . Sedimentation rate  . C-reactive protein        Rayn[facility]ious [facility] 10/15/2020, 10:50 AM

## 2020-10-15 NOTE — Progress Notes (Signed)
Call placed to [facility] for picc placement scheduling. Left VM with Morrie Sheldon, patient available anytime this week. Faxed orders to [facility] to run insurance to determine any PA's and co-pays.   Also faxed order to short stay as patient will need first dosing of medication.  Valarie Cones

## 2020-10-15 NOTE — Assessment & Plan Note (Addendum)
Patient with longstanding sacral decub ulcer after prolonged admission for COVID 19 last fall.  No cultures data to go off of and no personal history of MDRO.  Will plan empiric coverage with daptomycin 1100 every 24 hrs (dosing at 65m/kg off adjusted BW given BMI greater than 40), ceftriaxone 2gm IV every 24 hrs, and metronidazole 5059mPO q8h.  Will check baseline labs today with CBC, CMP, ESR, CRP, CK.  OPAT orders to be placed.  PICC line ordered and will anticipate 6 weeks of therapy from PICC placement.  RTC 4 weeks.  Continue wound care and glycemic control.  Will continue with Augmentin until PICC line placed.

## 2020-10-15 NOTE — Progress Notes (Signed)
Patient scheduled for picc line placement 10/19/20 at 9am. Patient made aware.  Collin Brown

## 2020-10-16 LAB — CBC
HCT: 43.2 % (ref 38.5–50.0)
Hemoglobin: 14.4 g/dL (ref 13.2–17.1)
MCH: 27.4 pg (ref 27.0–33.0)
MCHC: 33.3 g/dL (ref 32.0–36.0)
MCV: 82.3 fL (ref 80.0–100.0)
MPV: 10.6 fL (ref 7.5–12.5)
Platelets: 278 10*3/uL (ref 140–400)
RBC: 5.25 10*6/uL (ref 4.20–5.80)
RDW: 14.6 % (ref 11.0–15.0)
WBC: 7.7 10*3/uL (ref 3.8–10.8)

## 2020-10-16 LAB — COMPLETE METABOLIC PANEL WITH GFR
AG Ratio: 2.3 (calc) (ref 1.0–2.5)
ALT: 30 U/L (ref 9–46)
AST: 17 U/L (ref 10–40)
Albumin: 4.5 g/dL (ref 3.6–5.1)
Alkaline phosphatase (APISO): 85 U/L (ref 36–130)
BUN: 15 mg/dL (ref 7–25)
CO2: 25 mmol/L (ref 20–32)
Calcium: 10.2 mg/dL (ref 8.6–10.3)
Chloride: 102 mmol/L (ref 98–110)
Creat: 0.73 mg/dL (ref 0.60–1.35)
GFR, Est African American: 141 mL/min/{1.73_m2} (ref >=60)
GFR, Est Non African American: 122 mL/min/{1.73_m2} (ref >=60)
Globulin: 2 g/dL (calc) (ref 1.9–3.7)
Glucose, Bld: 244 mg/dL — ABNORMAL HIGH (ref 65–99)
Potassium: 4.4 mmol/L (ref 3.5–5.3)
Sodium: 137 mmol/L (ref 135–146)
Total Bilirubin: 0.3 mg/dL (ref 0.2–1.2)
Total Protein: 6.5 g/dL (ref 6.1–8.1)

## 2020-10-16 LAB — CK: Total CK: 69 U/L (ref 44–196)

## 2020-10-16 LAB — C-REACTIVE PROTEIN: CRP: 19.9 mg/L — ABNORMAL HIGH (ref <8.0)

## 2020-10-16 LAB — SEDIMENTATION RATE: Sed Rate: 25 mm/h — ABNORMAL HIGH (ref 0–15)

## 2020-10-18 ENCOUNTER — Telehealth: Payer: Self-pay

## 2020-10-18 NOTE — Telephone Encounter (Signed)
I called patient and relayed lab results to the patient. Patient also stated the morning of his appointment here he had not taken he insulin that day, so that may of been why his glucose was elevated. Patient had no questions or concerns. Titania Gault T Pricilla Loveless

## 2020-10-18 NOTE — Telephone Encounter (Signed)
-----   Message from Kathlynn Grate, DO sent at 10/17/2020  4:41 PM EST ----- Please let patient know that baseline labs are fine.  His glucose was fairly high so important to maintain good glycemic control while we treat infection and going forward.  Inflammatory markers also were elevated but not unexpected in setting of osteomyelitis and will be monitored during antibiotic treatment.

## 2020-10-19 ENCOUNTER — Encounter (HOSPITAL_COMMUNITY)
Admission: RE | Admit: 2020-10-19 | Discharge: 2020-10-19 | Disposition: A | Discharge status: Home / Self Care | Payer: BC Managed Care – PPO | Source: Ambulatory Visit | Attending: Internal Medicine | Admitting: Internal Medicine

## 2020-10-19 ENCOUNTER — Ambulatory Visit (HOSPITAL_COMMUNITY)
Admission: RE | Admit: 2020-10-19 | Discharge: 2020-10-19 | Disposition: A | Discharge status: Home / Self Care | Payer: BC Managed Care – PPO | Source: Ambulatory Visit | Attending: Internal Medicine | Admitting: Internal Medicine

## 2020-10-19 ENCOUNTER — Other Ambulatory Visit: Payer: Self-pay

## 2020-10-19 DIAGNOSIS — I878 Other specified disorders of veins: Secondary | ICD-10-CM | POA: Diagnosis not present

## 2020-10-19 DIAGNOSIS — M4628 Osteomyelitis of vertebra, sacral and sacrococcygeal region: Secondary | ICD-10-CM | POA: Insufficient documentation

## 2020-10-19 DIAGNOSIS — Z79899 Other long term (current) drug therapy: Secondary | ICD-10-CM

## 2020-10-19 DIAGNOSIS — Z8616 Personal history of COVID-19: Secondary | ICD-10-CM

## 2020-10-19 DIAGNOSIS — E1169 Type 2 diabetes mellitus with other specified complication: Secondary | ICD-10-CM

## 2020-10-19 LAB — TOXASSURE SELECT,+ANTIDEPR,UR

## 2020-10-19 MED ORDER — SODIUM CHLORIDE 0.9 % IV SOLN
2.0000 g | Freq: Once | INTRAVENOUS | Status: AC
  Administered 2020-10-19: 2 g via INTRAVENOUS
  Filled 2020-10-19: qty 2

## 2020-10-19 MED ORDER — HEPARIN SOD (PORK) LOCK FLUSH 100 UNIT/ML IV SOLN: 250.0000 [IU] | INTRAVENOUS | Status: DC | PRN

## 2020-10-19 MED ORDER — HEPARIN SOD (PORK) LOCK FLUSH 100 UNIT/ML IV SOLN
INTRAVENOUS | Status: AC
  Administered 2020-10-19: 250 [IU]
  Filled 2020-10-19: qty 5

## 2020-10-19 MED ORDER — HEPARIN SOD (PORK) LOCK FLUSH 100 UNIT/ML IV SOLN: 250.0000 [IU] | Freq: Every day | INTRAVENOUS | Status: DC

## 2020-10-19 MED ORDER — SODIUM CHLORIDE 0.9 % IV SOLN
1100.0000 mg | Freq: Once | INTRAVENOUS | Status: AC
  Administered 2020-10-19: 1100 mg via INTRAVENOUS
  Filled 2020-10-19: qty 22

## 2020-10-19 MED ORDER — LIDOCAINE HCL 1 % IJ SOLN
INTRAMUSCULAR | Status: AC
  Filled 2020-10-19: qty 20

## 2020-10-19 MED ORDER — LIDOCAINE HCL 1 % IJ SOLN
INTRAMUSCULAR | Status: AC | PRN
  Administered 2020-10-19: 20 mL via INTRADERMAL

## 2020-10-19 NOTE — Procedures (Signed)
Successful placement of single lumen PICC line to right basilic vein. Length 36cm Tip at lower SVC/RA PICC capped No complications Ready for use.  EBL < 5 mL   Hoyt Koch PA-C 10/19/2020 10:54 AM

## 2020-10-22 ENCOUNTER — Other Ambulatory Visit: Payer: Self-pay | Admitting: Physical Medicine and Rehabilitation

## 2020-10-22 MED ORDER — OXYCODONE-ACETAMINOPHEN 10-325 MG PO TABS: 1.0000 | ORAL_TABLET | Freq: Four times a day (QID) | ORAL | 0 refills | Status: DC | PRN

## 2020-10-23 ENCOUNTER — Other Ambulatory Visit: Payer: Self-pay | Admitting: Adult Health

## 2020-10-23 ENCOUNTER — Telehealth: Payer: Self-pay

## 2020-10-23 DIAGNOSIS — E1165 Type 2 diabetes mellitus with hyperglycemia: Secondary | ICD-10-CM

## 2020-10-23 DIAGNOSIS — N390 Urinary tract infection, site not specified: Secondary | ICD-10-CM

## 2020-10-23 DIAGNOSIS — I10 Essential (primary) hypertension: Secondary | ICD-10-CM

## 2020-10-23 DIAGNOSIS — G40909 Epilepsy, unspecified, not intractable, without status epilepticus: Secondary | ICD-10-CM

## 2020-10-23 DIAGNOSIS — D72819 Decreased white blood cell count, unspecified: Secondary | ICD-10-CM

## 2020-10-23 DIAGNOSIS — M6281 Muscle weakness (generalized): Secondary | ICD-10-CM | POA: Diagnosis not present

## 2020-10-23 DIAGNOSIS — L89154 Pressure ulcer of sacral region, stage 4: Secondary | ICD-10-CM | POA: Diagnosis not present

## 2020-10-23 DIAGNOSIS — J9621 Acute and chronic respiratory failure with hypoxia: Secondary | ICD-10-CM | POA: Diagnosis not present

## 2020-10-23 DIAGNOSIS — A029 Salmonella infection, unspecified: Secondary | ICD-10-CM

## 2020-10-23 DIAGNOSIS — U099 Post covid-19 condition, unspecified: Secondary | ICD-10-CM | POA: Diagnosis not present

## 2020-10-23 DIAGNOSIS — G8314 Monoplegia of lower limb affecting left nondominant side: Secondary | ICD-10-CM

## 2020-10-23 DIAGNOSIS — G540 Brachial plexus disorders: Secondary | ICD-10-CM

## 2020-10-23 DIAGNOSIS — B961 Klebsiella pneumoniae [K. pneumoniae] as the cause of diseases classified elsewhere: Secondary | ICD-10-CM

## 2020-10-23 DIAGNOSIS — D62 Acute posthemorrhagic anemia: Secondary | ICD-10-CM

## 2020-10-23 DIAGNOSIS — F209 Schizophrenia, unspecified: Secondary | ICD-10-CM

## 2020-10-23 NOTE — Telephone Encounter (Signed)
Received call from [facility] RN stating she was unable to draw blood from picc line on 3/14.  Nurse states she repositioned patients arm and still no return. Patient's left arm is swollen as he has history of left sided brachial plexus. Advised nurse to try prn cath flo and reassess picc line for blood return. Routing to MD to also make aware. Valarie Cones

## 2020-10-23 NOTE — Telephone Encounter (Signed)
Requested medication (s) are due for refill today:   Yes  Requested medication (s) are on the active medication list:   Yes  Future visit scheduled:   Yes   Last ordered: 09/11/2020 #120, 0 refills  Clinic note:  Returned because not sure if Marcelino Duster wants him to continue taking this as she only wrote for 1 month   Requested Prescriptions  Pending Prescriptions Disp Refills   potassium chloride SA (KLOR-CON) 20 MEQ tablet [Pharmacy Med Name: Potassium Chloride Crys ER 20 MEQ Oral Tablet Extended Release] 120 tablet 0    Sig: Take 2 tablets by mouth twice daily      Endocrinology:  Minerals - Potassium Supplementation Passed - 10/23/2020 12:27 PM      Passed - K in normal range and within 360 days    Potassium  Date Value Ref Range Status  10/15/2020 4.4 3.5 - 5.3 mmol/L Final          Passed - Cr in normal range and within 360 days    Creat  Date Value Ref Range Status  10/15/2020 0.73 0.60 - 1.35 mg/dL Final          Passed - Valid encounter within last 12 months    Recent Outpatient Visits           1 month ago Dental infection   [employer], Eula Fried, FNP   2 months ago Diabetes mellitus, new onset [facility])   [facility] [name], Eula Fried, FNP       Future Appointments             In 4 weeks Kathlynn Grate, DO [facility]   In 1 month [name], Eula Fried, FNP [facility], [facility]

## 2020-10-24 NOTE — Telephone Encounter (Signed)
Ok will refill, just needs CMP to recheck in 3 months or so.

## 2020-10-24 NOTE — Telephone Encounter (Signed)
Labs within normal range, do you want him to continue on prescription? KW

## 2020-10-26 ENCOUNTER — Encounter (HOSPITAL_BASED_OUTPATIENT_CLINIC_OR_DEPARTMENT_OTHER): Payer: BC Managed Care – PPO | Admitting: Internal Medicine

## 2020-10-26 ENCOUNTER — Other Ambulatory Visit: Payer: Self-pay

## 2020-10-26 ENCOUNTER — Telehealth: Payer: Self-pay

## 2020-10-26 DIAGNOSIS — E11622 Type 2 diabetes mellitus with other skin ulcer: Secondary | ICD-10-CM | POA: Diagnosis not present
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
## 2020-10-26 NOTE — Telephone Encounter (Signed)
Judeth Cornfield, OT from [facility] called requesting HHOT continuous 1wk4. Orders approved and given.

## 2020-10-26 NOTE — Progress Notes (Addendum)
Collin Brown, Collin Brown ([id number]) Visit Report for 10/26/2020 Arrival Information Details Patient Name: Date of Service: Collin Brown, Collin M. 10/26/2020 12:30 PM Medical Record Number: [id number] Patient Account Number: [id number] Date of Birth/Sex: Treating RN: 28-Sep-1985 (35 y.o. [name] Primary Care Mclain Freer: Laverna Peace Other Clinician: Referring Joram Venson: Treating Khairi Garman/Extender: Hessie Dibble, [name] Weeks in Treatment: 10 Visit Information History Since Last Visit Added or deleted any medications: Yes Patient Arrived: Ambulatory Any new allergies or adverse reactions: No Arrival Time: 12:53 Had a fall or experienced change in No Accompanied By: mother activities of daily living that may affect Transfer Assistance: None risk of falls: Patient Requires Transmission-Based Precautions: No Signs or symptoms of abuse/neglect since last visito No Patient Has Alerts: No Hospitalized since last visit: No Implantable device outside of the clinic excluding No cellular tissue based products placed in the center since last visit: Has Dressing in Place as Prescribed: Yes Brown Present Now: No Electronic Signature(s) Signed: 10/26/2020 5:12:39 PM By: Baruch Gouty RN, BSN Entered By: Baruch Gouty on 10/26/2020 12:53:52 -------------------------------------------------------------------------------- Clinic Level of Care Assessment Details Patient Name: Date of Service: Collin Brown, Collin [name] 10/26/2020 12:30 PM Medical Record Number: [id number] Patient Account Number: [id number] Date of Birth/Sex: Treating RN: February 02, 1986 (35 y.o. [name] Primary Care Vernella Niznik: Laverna Peace Other Clinician: Referring Kou Gucciardo: Treating Nickoli Bagheri/Extender: Hessie Dibble, [name] Weeks in Treatment: 10 Clinic Level of Care Assessment Items TOOL 4 Quantity Score X- 1 0 Use when only an EandM is performed on FOLLOW-UP visit ASSESSMENTS - Nursing  Assessment / Reassessment X- 1 10 Reassessment of Co-morbidities (includes updates in patient status) X- 1 5 Reassessment of Adherence to Treatment Plan ASSESSMENTS - Wound and Skin A ssessment / Reassessment X - Simple Wound Assessment / Reassessment - one wound 1 5 _0  - 0 Complex Wound Assessment / Reassessment - multiple wounds _1  - 0 Dermatologic / Skin Assessment (not related to wound area) ASSESSMENTS - Focused Assessment _2  - 0 Circumferential Edema Measurements - multi extremities _3  - 0 Nutritional Assessment / Counseling / Intervention _4  - 0 Lower Extremity Assessment (monofilament, tuning fork, pulses) _5  - 0 Peripheral Arterial Disease Assessment (using hand held doppler) ASSESSMENTS - Ostomy and/or Continence Assessment and Care _6  - 0 Incontinence Assessment and Management _7  - 0 Ostomy Care Assessment and Management (repouching, etc.) PROCESS - Coordination of Care _8  - 0 Simple Patient / Family Education for ongoing care X- 1 20 Complex (extensive) Patient / Family Education for ongoing care X- 1 10 Staff obtains Programmer, systems, Records, T Results / Process Orders est X- 1 10 Staff telephones HHA, Nursing Homes / Clarify orders / etc _9  - 0 Routine Transfer to another Facility (non-emergent condition) _10  - 0 Routine Hospital Admission (non-emergent condition) _11  - 0 New Admissions / Biomedical engineer / Ordering NPWT Apligraf, etc. , _12  - 0 Emergency Hospital Admission (emergent condition) _13  - 0 Simple Discharge Coordination _14  - 0 Complex (extensive) Discharge Coordination PROCESS - Special Needs _15  - 0 Pediatric / Minor Patient Management _16  - 0 Isolation Patient Management _17  - 0 Hearing / Language / Visual special needs _18  - 0 Assessment of Community assistance (transportation, D/C planning, etc.) _19  - 0 Additional assistance / Altered mentation X- 1 15 Support Surface(s) Assessment (bed, cushion, seat, etc.) INTERVENTIONS - Wound  Cleansing / Measurement X - Simple Wound Cleansing - one wound 1 5 _20  - 0 Complex Wound Cleansing - multiple wounds X- 1 5 Wound Imaging (photographs - any  number of wounds) _0  - 0 Wound Tracing (instead of photographs) X- 1 5 Simple Wound Measurement - one wound _1  - 0 Complex Wound Measurement - multiple wounds INTERVENTIONS - Wound Dressings _2  - 0 Small Wound Dressing one or multiple wounds X- 1 15 Medium Wound Dressing one or multiple wounds _3  - 0 Large Wound Dressing one or multiple wounds <DUKGURKYHCWCBJSE>_8<\/BTDVVOHYWVPXTGGY>_6  - 0 Application of Medications - topical <RSWNIOEVOJJKKXFG>_1<\/WEXHBZJIRCVELFYB>_0  - 0 Application of Medications - injection INTERVENTIONS - Miscellaneous _6  - 0 External ear exam _7  - 0 Specimen Collection (cultures, biopsies, blood, body fluids, etc.) _8  - 0 Specimen(s) / Culture(s) sent or taken to Lab for analysis _9  - 0 Patient Transfer (multiple staff / Civil Service fast streamer / Similar devices) _10  - 0 Simple Staple / Suture removal (25 or less) _11  - 0 Complex Staple / Suture removal (26 or more) _12  - 0 Hypo / Hyperglycemic Management (close monitor of Blood Glucose) _13  - 0 Ankle / Brachial Index (ABI) - do not check if billed separately X- 1 5 Vital Signs Has the patient been seen at the hospital within the last three years: Yes Total Score: 110 Level Of Care: New/Established - Level 3 Electronic Signature(s) Signed: 10/26/2020 5:48:33 PM By: Lorrin Jackson Entered By: Lorrin Jackson on 10/26/2020 13:41:21 -------------------------------------------------------------------------------- Encounter Discharge Information Details Patient Name: Date of Service: Collin Pain, Collin [name] 10/26/2020 12:30 PM Medical Record Number: [id number] Patient Account Number: [id number] Date of Birth/Sex: Treating RN: 1986/06/04 (35 y.o. [name], [name] Terryn Redner: Laverna Peace Other Clinician: Referring Jess Sulak: Treating Jaquasia Doscher/Extender: Hessie Dibble, [name] Weeks in Treatment: 10 Encounter  Discharge Information Items Discharge Condition: Stable Ambulatory Status: Ambulatory Discharge Destination: Home Transportation: Private Auto Accompanied By: mom Schedule Follow-up Appointment: Yes Clinical Summary of Care: Patient Declined Electronic Signature(s) Signed: 10/30/2020 5:23:06 PM By: Rhae Hammock RN Entered By: Rhae Hammock on 10/26/2020 13:44:47 -------------------------------------------------------------------------------- Lower Extremity Assessment Details Patient Name: Date of Service: Collin Pain, Collin [name] 10/26/2020 12:30 PM Medical Record Number: [id number] Patient Account Number: [id number] Date of Birth/Sex: Treating RN: 20-Nov-1985 (35 y.o. [name] Primary Care Mechel Haggard: Laverna Peace Other Clinician: Referring Zyquan Crotty: Treating Masa Lubin/Extender: Hessie Dibble, [name] Weeks in Treatment: 10 Electronic Signature(s) Signed: 10/26/2020 5:12:39 PM By: Baruch Gouty RN, BSN Entered By: Baruch Gouty on 10/26/2020 12:56:10 -------------------------------------------------------------------------------- Multi Wound Chart Details Patient Name: Date of Service: Collin Pain, Collin [name] 10/26/2020 12:30 PM Medical Record Number: [id number] Patient Account Number: [id number] Date of Birth/Sex: Treating RN: Nov 02, 1985 (35 y.o. [name] Primary Care Joniyah Mallinger: Laverna Peace Other Clinician: Referring Fenix Ruppe: Treating Veria Stradley/Extender: Hessie Dibble, [name] Weeks in Treatment: 10 Vital Signs Height(in): 70 Capillary Blood Glucose(mg/dl): 120 Weight(lbs): 341 Pulse(bpm): 106 Body Mass Index(BMI): 46 Blood Pressure(mmHg): 113/62 Temperature(F): 98.1 Respiratory Rate(breaths/min): 18 Photos: [1:No Photos Sacrum] [N/A:N/A N/A] Wound Location: [1:Pressure Injury] [N/A:N/A] Wounding Event: [1:Pressure Ulcer] [N/A:N/A] Primary Etiology: [1:Anemia, Sleep Apnea, Hypertension, N/A] Comorbid History:  [1:Type II Diabetes 05/18/2020] [N/A:N/A] Date Acquired: [1:10] [N/A:N/A] Weeks of Treatment: [1:Open] [N/A:N/A] Wound Status: [1:0.7x1.3x4.7] [N/A:N/A] Measurements L x W x D (cm) [1:0.715] [N/A:N/A] A (cm) : rea [1:3.359] [N/A:N/A] Volume (cm) : [1:50.00%] [N/A:N/A] % Reduction in A rea: [1:47.80%] [N/A:N/A] % Reduction in Volume: [1:Category/Stage IV] [N/A:N/A] Classification: [1:Small] [N/A:N/A] Exudate A mount: [1:Serosanguineous] [N/A:N/A] Exudate Type: [1:red, brown] [N/A:N/A] Exudate Color: [1:Distinct, outline attached] [N/A:N/A] Wound Margin: [1:Large (67-100%)] [N/A:N/A] Granulation A mount: [1:Red] [N/A:N/A] Granulation Quality: [1:None Present (0%)] [N/A:N/A] Necrotic A mount: [1:Fat Layer (Subcutaneous Tissue): Yes N/A] Exposed Structures: [  1:Fascia: No Tendon: No Muscle: No Joint: No Bone: No Small (1-33%)] [N/A:N/A] Treatment Notes Wound #1 (Sacrum) Cleanser Normal Saline Discharge Instruction: Cleanse the wound with Normal Saline prior to applying a clean dressing using gauze sponges, not tissue or cotton balls. Normal Saline Discharge Instruction: Cleanse the wound with Normal Saline prior to applying a clean dressing using gauze sponges, not tissue or cotton balls. Peri-Wound Care Skin Prep Discharge Instruction: Use skin prep as directed Topical Primary Dressing KerraCel Ag Gelling Fiber Dressing, 4x5 in (silver alginate) Discharge Instruction: Apply silver alginate packed lightly into wound Secondary Dressing ComfortFoam Border, 6x6 in (silicone border) Discharge Instruction: Apply over primary dressing as directed. Secured With Compression Wrap Compression Stockings Environmental education officer) Signed: 10/26/2020 5:12:39 PM By: Baruch Gouty RN, BSN Signed: 10/26/2020 5:26:06 PM By: Linton Ham MD Entered By: Linton Ham on 10/26/2020  14:13:31 -------------------------------------------------------------------------------- Multi-Disciplinary Care Plan Details Patient Name: Date of Service: Collin Pain, Collin [name] 10/26/2020 12:30 PM Medical Record Number: [id number] Patient Account Number: [id number] Date of Birth/Sex: Treating RN: Jul 03, 1986 (35 y.o. [name] Primary Care Shanequia Kendrick: Laverna Peace Other Clinician: Referring Jinna Weinman: Treating Catarina Huntley/Extender: Effie Shy Weeks in Treatment: 10 Multidisciplinary Care Plan reviewed with physician Active Inactive Nutrition Nursing Diagnoses: Potential for alteratiion in Nutrition/Potential for imbalanced nutrition Goals: Patient/caregiver agrees to and verbalizes understanding of need to obtain nutritional consultation Date Initiated: 08/16/2020 Date Inactivated: 09/14/2020 Target Resolution Date: 09/14/2020 Goal Status: Met Patient/caregiver verbalizes understanding of need to maintain therapeutic glucose control per primary care physician Date Initiated: 08/16/2020 Target Resolution Date: 11/02/2020 Goal Status: Active Interventions: Assess HgA1c results as ordered upon admission and as needed Provide education on elevated blood sugars and impact on wound healing Provide education on nutrition Treatment Activities: Obtain HgA1c : 08/16/2020 Patient referred to Primary Care Physician for further nutritional evaluation : 08/16/2020 Notes: Osteomyelitis Nursing Diagnoses: Infection: osteomyelitis Knowledge deficit related to disease process and management Goals: Patient's osteomyelitis will resolve Date Initiated: 10/05/2020 Target Resolution Date: 11/02/2020 Goal Status: Active Interventions: Assess for signs and symptoms of osteomyelitis resolution every visit Provide education on osteomyelitis Treatment Activities: Systemic antibiotics : 10/05/2020 Notes: Pressure Nursing Diagnoses: Knowledge deficit related to management of  pressures ulcers Potential for impaired tissue integrity related to pressure, friction, moisture, and shear Goals: Patient will remain free from development of additional pressure ulcers Date Initiated: 08/16/2020 Date Inactivated: 09/14/2020 Target Resolution Date: 09/14/2020 Goal Status: Met Patient/caregiver will verbalize understanding of pressure ulcer management Date Initiated: 08/16/2020 Target Resolution Date: 11/09/2020 Goal Status: Active Interventions: Assess: immobility, friction, shearing, incontinence upon admission and as needed Assess potential for pressure ulcer upon admission and as needed Provide education on pressure ulcers Treatment Activities: Consult for HBO : 08/16/2020 Pressure reduction/relief device ordered : 08/16/2020 T ordered outside of clinic : 08/16/2020 est Notes: Wound/Skin Impairment Nursing Diagnoses: Knowledge deficit related to ulceration/compromised skin integrity Goals: Patient/caregiver will verbalize understanding of skin care regimen Date Initiated: 08/16/2020 Target Resolution Date: 11/09/2020 Goal Status: Active Interventions: Assess patient/caregiver ability to perform ulcer/skin care regimen upon admission and as needed Assess ulceration(s) every visit Provide education on ulcer and skin care Treatment Activities: Skin care regimen initiated : 08/16/2020 Topical wound management initiated : 08/16/2020 Notes: Electronic Signature(s) Signed: 10/26/2020 12:49:40 PM By: Lorrin Jackson Entered By: Lorrin Jackson on 10/26/2020 12:49:39 -------------------------------------------------------------------------------- Brown Assessment Details Patient Name: Date of Service: Collin Pain, Collin [name] 10/26/2020 12:30 PM Medical Record Number: [id number] Patient Account Number: [id number] Date of Birth/Sex: Treating RN: [birth year] (  34 y.o. [name] Primary Care Shaye Lagace: Laverna Peace Other Clinician: Referring Alla Sloma: Treating Jenesa Foresta/Extender:  Hessie Dibble, [name] Weeks in Treatment: 10 Active Problems Location of Brown Severity and Description of Brown Patient Has Paino Yes Site Locations Brown Location: Brown Location: Brown in Ulcers With Dressing Change: Yes Duration of the Brown. Constant / Intermittento Intermittent Rate the Brown. Current Brown Level: 0 Worst Brown Level: 7 Least Brown Level: 0 Character of Brown Describe the Brown: Aching Brown Management and Medication Current Brown Management: Medication: Yes Other: time Is the Current Brown Management Adequate: Adequate How does your wound impact your activities of daily livingo Sleep: Yes Bathing: No Appetite: No Relationship With Others: No Bladder Continence: No Emotions: No Bowel Continence: No Work: No Toileting: No Drive: No Dressing: No Hobbies: No Electronic Signature(s) Signed: 10/26/2020 5:12:39 PM By: Baruch Gouty RN, BSN Entered By: Baruch Gouty on 10/26/2020 12:56:04 -------------------------------------------------------------------------------- Patient/Caregiver Education Details Patient Name: Date of Service: Collin Pain, Collin [name] 3/18/2022andnbsp12:30 PM Medical Record Number: [id number] Patient Account Number: [id number] Date of Birth/Gender: Treating RN: March 12, 1986 (35 y.o. [name] Primary Care Physician: Laverna Peace Other Clinician: Referring Physician: Treating Physician/Extender: Hessie Dibble, [name] Weeks in Treatment: 10 Education Assessment Education Provided To: Patient Education Topics Provided Infection: Methods: Explain/Verbal Responses: State content correctly Pressure: Methods: Explain/Verbal, Printed Responses: State content correctly Wound/Skin Impairment: Methods: Explain/Verbal, Printed Responses: State content correctly Electronic Signature(s) Signed: 10/26/2020 5:48:33 PM By: Lorrin Jackson Entered By: Lorrin Jackson on 10/26/2020  12:50:11 -------------------------------------------------------------------------------- Wound Assessment Details Patient Name: Date of Service: Collin Pain, Collin [name] 10/26/2020 12:30 PM Medical Record Number: [id number] Patient Account Number: [id number] Date of Birth/Sex: Treating RN: 1986/04/10 (35 y.o. [name] Primary Care Raheel Kunkle: Laverna Peace Other Clinician: Referring Ayren Zumbro: Treating Carlton Sweaney/Extender: Hessie Dibble, [name] Weeks in Treatment: 10 Wound Status Wound Number: 1 Primary Etiology: Pressure Ulcer Wound Location: Sacrum Wound Status: Open Wounding Event: Pressure Injury Comorbid History: Anemia, Sleep Apnea, Hypertension, Type II Diabetes Date Acquired: 05/18/2020 Weeks Of Treatment: 10 Clustered Wound: No Wound Measurements Length: (cm) 0.7 Width: (cm) 1.3 Depth: (cm) 4.7 Area: (cm) 0.715 Volume: (cm) 3.359 % Reduction in Area: 50% % Reduction in Volume: 47.8% Epithelialization: Small (1-33%) Tunneling: No Undermining: No Wound Description Classification: Category/Stage IV Wound Margin: Distinct, outline attached Exudate Amount: Small Exudate Type: Serosanguineous Exudate Color: red, brown Foul Odor After Cleansing: No Slough/Fibrino No Wound Bed Granulation Amount: Large (67-100%) Exposed Structure Granulation Quality: Red Fascia Exposed: No Necrotic Amount: None Present (0%) Fat Layer (Subcutaneous Tissue) Exposed: Yes Tendon Exposed: No Muscle Exposed: No Joint Exposed: No Bone Exposed: No Treatment Notes Wound #1 (Sacrum) Cleanser Normal Saline Discharge Instruction: Cleanse the wound with Normal Saline prior to applying a clean dressing using gauze sponges, not tissue or cotton balls. Normal Saline Discharge Instruction: Cleanse the wound with Normal Saline prior to applying a clean dressing using gauze sponges, not tissue or cotton balls. Peri-Wound Care Skin Prep Discharge Instruction: Use skin  prep as directed Topical Primary Dressing KerraCel Ag Gelling Fiber Dressing, 4x5 in (silver alginate) Discharge Instruction: Apply silver alginate packed lightly into wound Secondary Dressing ComfortFoam Border, 6x6 in (silicone border) Discharge Instruction: Apply over primary dressing as directed. Secured With Compression Wrap Compression Stockings Environmental education officer) Signed: 10/26/2020 5:12:39 PM By: Baruch Gouty RN, BSN Entered By: Baruch Gouty on 10/26/2020 13:01:15 -------------------------------------------------------------------------------- Vitals Details Patient Name: Date of Service: Collin Pain, Collin [name] 10/26/2020 12:30 PM Medical Record Number: [id number] Patient  Account Number: [id number] Date of Birth/Sex: Treating RN: 10-Jul-1986 (35 y.o. [name] Primary Care Billyjack Trompeter: Laverna Peace Other Clinician: Referring Markeia Harkless: Treating Lamorris Knoblock/Extender: Hessie Dibble, [name] Weeks in Treatment: 10 Vital Signs Time Taken: 12:51 Temperature (F): 98.1 Height (in): 70 Pulse (bpm): 106 Source: Stated Respiratory Rate (breaths/min): 18 Weight (lbs): 341 Blood Pressure (mmHg): 113/62 Source: Stated Capillary Blood Glucose (mg/dl): 120 Body Mass Index (BMI): 48.9 Reference Range: 80 - 120 mg / dl Notes glucose per pt report Electronic Signature(s) Signed: 10/26/2020 5:12:39 PM By: Baruch Gouty RN, BSN Entered By: Baruch Gouty on 10/26/2020 12:52:37

## 2020-11-05 ENCOUNTER — Telehealth: Payer: Self-pay

## 2020-11-05 ENCOUNTER — Other Ambulatory Visit (HOSPITAL_COMMUNITY): Payer: Self-pay | Admitting: Internal Medicine

## 2020-11-05 DIAGNOSIS — M4628 Osteomyelitis of vertebra, sacral and sacrococcygeal region: Secondary | ICD-10-CM

## 2020-11-05 NOTE — Telephone Encounter (Signed)
Thank you :)

## 2020-11-05 NOTE — Telephone Encounter (Addendum)
Patient's nurse [name] with [facility] [phone]) called stating she when she went out to see the patient today she noticed blood under the dressing and redness around the insertion site.  [name] also stated the picc line had migrated 1 cm since she last seen the patient. Patient appeared to be flushed and pale when [name] also saw him today. She stated patient stated he felt fine.  Please advise.  Collin Brown T Pricilla Loveless

## 2020-11-05 NOTE — Telephone Encounter (Signed)
Would it be possible to have him seen by IR for PICC evaluation to ensure there are no issues with his line?

## 2020-11-05 NOTE — Telephone Encounter (Signed)
I spoke with Collin Brown with IR and patient is scheduled for Picc evaluation on 11/06/20 and arrival time 1:45 pm. Collin Brown has routed an order for you to sign Dr. Earlene Plater for the patient. I have also spoke to the patient and given him the appointment information and he verbalized understanding. Patient also reported he was feeling ok Patient advised if he started having any fevers or anything go to ED. Collin Brown T Pricilla Loveless

## 2020-11-06 ENCOUNTER — Ambulatory Visit (HOSPITAL_COMMUNITY): Admission: RE | Admit: 2020-11-06 | Payer: BC Managed Care – PPO | Source: Ambulatory Visit

## 2020-11-07 ENCOUNTER — Encounter (HOSPITAL_COMMUNITY): Payer: Self-pay

## 2020-11-07 ENCOUNTER — Other Ambulatory Visit (HOSPITAL_COMMUNITY): Payer: Self-pay | Admitting: Internal Medicine

## 2020-11-07 ENCOUNTER — Ambulatory Visit (HOSPITAL_COMMUNITY)
Admission: RE | Admit: 2020-11-07 | Discharge: 2020-11-07 | Disposition: A | Discharge status: Home / Self Care | Payer: BC Managed Care – PPO | Source: Ambulatory Visit | Attending: Internal Medicine | Admitting: Internal Medicine

## 2020-11-07 ENCOUNTER — Other Ambulatory Visit: Payer: Self-pay

## 2020-11-07 DIAGNOSIS — Z452 Encounter for adjustment and management of vascular access device: Secondary | ICD-10-CM | POA: Diagnosis present

## 2020-11-07 DIAGNOSIS — M4628 Osteomyelitis of vertebra, sacral and sacrococcygeal region: Secondary | ICD-10-CM | POA: Diagnosis not present

## 2020-11-07 NOTE — Progress Notes (Signed)
Mr. Hoos presented to [facility] for PICC revision today.   He states that "my nurse told me the PICC looks pulled about a centimeter and there was blood at the site. She also told me that she could not get blood from the PICC." Patient denies any complaints with the PICC, no pain, swelling, tenderness.   Upon evaluation, the PICC appears to be pulled half centimeter.  Was able to aspirate the PICC with no complaints from patient, and when the PICC was flushed with NS, patient states "I can taste the saline."   A trace of old, clotted  blood noted at the exit site, no sign of active bleeding.   There was no erythema, edema, tenderness, or drainage noted at exit site. Stat lock in place.   Discussed with Dr. Elby Showers for possible CXR to evaluate the tip position.  Per Dr. Elby Showers, CXR is not indicated at this moment as the PICC works well,  patient has no complaints, and site is unremarkable other than small amount of old clotted blood.   No further intervention needed at the moment as PICC appears to be fully functioning.   Assured patient that PICC is fully functional, and the trace of old blood at the site is ok as there is no active bleeding.  Advised patient to call IR if there is any problem such as active bleeding, pain with flush, not able to aspirate, and active sign of infection such as erythema or  discharge at the site.  Patient verbalized understanding.   Ryo Klang Rexene Edison Shawn Dannenberg PA-C 11/07/2020 8:20 AM

## 2020-11-08 ENCOUNTER — Other Ambulatory Visit: Payer: Self-pay

## 2020-11-08 ENCOUNTER — Encounter (HOSPITAL_BASED_OUTPATIENT_CLINIC_OR_DEPARTMENT_OTHER): Payer: BC Managed Care – PPO | Admitting: Registered Nurse

## 2020-11-08 ENCOUNTER — Encounter: Payer: Self-pay | Admitting: Registered Nurse

## 2020-11-08 VITALS — BP 116/84 | HR 89 | Temp 98.1°F | Ht 71.0 in | Wt 376.6 lb

## 2020-11-08 DIAGNOSIS — L89154 Pressure ulcer of sacral region, stage 4: Secondary | ICD-10-CM | POA: Diagnosis not present

## 2020-11-08 DIAGNOSIS — G894 Chronic pain syndrome: Secondary | ICD-10-CM

## 2020-11-08 DIAGNOSIS — G90512 Complex regional pain syndrome I of left upper limb: Secondary | ICD-10-CM | POA: Diagnosis not present

## 2020-11-08 DIAGNOSIS — G7281 Critical illness myopathy: Secondary | ICD-10-CM | POA: Diagnosis not present

## 2020-11-08 DIAGNOSIS — Z79891 Long term (current) use of opiate analgesic: Secondary | ICD-10-CM

## 2020-11-08 DIAGNOSIS — Z5181 Encounter for therapeutic drug level monitoring: Secondary | ICD-10-CM

## 2020-11-08 DIAGNOSIS — G540 Brachial plexus disorders: Secondary | ICD-10-CM | POA: Diagnosis not present

## 2020-11-08 MED ORDER — OXYCODONE-ACETAMINOPHEN 10-325 MG PO TABS: 1.0000 | ORAL_TABLET | Freq: Four times a day (QID) | ORAL | 0 refills | Status: DC | PRN

## 2020-11-08 NOTE — Progress Notes (Signed)
Subjective:    Patient ID: Collin Brown, male    DOB: 1986-04-18, 35 y.o.   MRN: [id number]  HPI: Collin Brown is a 35 y.o. male who returns for follow up appointment for chronic pain and medication refill.  He states his pain is located in his left arm, lower back pain and reports sacral pain.  He rates his pain 4. His current exercise regime is walking   Mr. Pfeifle Morphine equivalent is 60.00 MME.    Last UDS was Performed on 10/11/2020, it was consistent.   Mr. Sliwa mother in room.    Pain Inventory Average Pain 5 Pain Right Now 4 My pain is sharp, burning, stabbing and tingling  In the last 24 hours, has pain interfered with the following? General activity 4 Relation with others 5 Enjoyment of life 6 What TIME of day is your pain at its worst? night Sleep (in general) Fair  Pain is worse with: walking, bending, sitting, inactivity and standing Pain improves with: rest, heat/ice and medication Relief from Meds: 8  Family History  Problem Relation Age of Onset  . Diabetes Mellitus II Mother   . Hypertension Mother   . Diabetes Mother   . Diabetes Mellitus II Father   . High blood pressure Father   . Heart attack Father    Social History   Socioeconomic History  . Marital status: Single    Spouse name: Not on file  . Number of children: Not on file  . Years of education: Not on file  . Highest education level: Not on file  Occupational History  . Occupation: Risk manager  Tobacco Use  . Smoking status: Never Smoker  . Smokeless tobacco: Never Used  Vaping Use  . Vaping Use: Never used  Substance and Sexual Activity  . Alcohol use: No  . Drug use: No  . Sexual activity: Yes    Birth control/protection: None  Other Topics Concern  . Not on file  Social History Narrative  . Not on file   Social Determinants of Health   Financial Resource Strain: Not on file  Food Insecurity: Not on file  Transportation Needs: Not on file  Physical  Activity: Not on file  Stress: Not on file  Social Connections: Not on file   Past Surgical History:  Procedure Laterality Date  . TONSILLECTOMY    . WOUND DEBRIDEMENT N/A 06/29/2020   Procedure: DEBRIDEMENT SACRAL WOUND;  Surgeon: Axel Filler, MD;  Location: [facility] OR;  Service: General;  Laterality: N/A;   Past Surgical History:  Procedure Laterality Date  . TONSILLECTOMY    . WOUND DEBRIDEMENT N/A 06/29/2020   Procedure: DEBRIDEMENT SACRAL WOUND;  Surgeon: Axel Filler, MD;  Location: [facility] OR;  Service: General;  Laterality: N/A;   Past Medical History:  Diagnosis Date  . Acute blood loss anemia   . Critical illness myopathy   . Diabetes mellitus without complication (HCC)   . Dyspnea 08/23/2020  . Hearing loss in right ear   . Hypertension   . Sleep apnea    BP 116/84   Pulse 89   Temp 98.1 F (36.7 C)   Ht 5\' 11"  (1.803 m)   Wt (!) 376 lb 9.6 oz (170.8 kg)   SpO2 94%   BMI 52.53 kg/m   Opioid Risk Score:   Fall Risk Score:  `1  Depression screen PHQ 2/9  Depression screen [facility] 2/9 10/15/2020 09/07/2020 08/17/2020 07/19/2020  Decreased Interest 0 0 1 2  Down, Depressed, Hopeless 0 0 1 0  PHQ - 2 Score 0 0 2 2  Altered sleeping - - 2 3  Tired, decreased energy - - 2 2  Change in appetite - - 0 1  Feeling bad or failure about yourself  - - 1 1  Trouble concentrating - - 0 0  Moving slowly or fidgety/restless - - 0 0  Suicidal thoughts - - 0 0  PHQ-9 Score - - 7 9  Difficult doing work/chores - - Extremely dIfficult Very difficult      Review of Systems  Constitutional: Negative.   HENT: Negative.   Eyes: Negative.   Respiratory: Negative.   Cardiovascular: Negative.   Gastrointestinal: Negative.   Endocrine: Negative.   Genitourinary: Negative.   Musculoskeletal: Negative.   Skin: Negative.   Allergic/Immunologic: Negative.   Neurological: Negative.   Hematological: Negative.   Psychiatric/Behavioral: Negative.        Objective:   Physical  Exam Vitals and nursing note reviewed.  Constitutional:      Appearance: Normal appearance. He is obese.  Cardiovascular:     Rate and Rhythm: Normal rate and regular rhythm.     Pulses: Normal pulses.     Heart sounds: Normal heart sounds.  Pulmonary:     Effort: Pulmonary effort is normal.     Breath sounds: Normal breath sounds.  Musculoskeletal:     Cervical back: Normal range of motion and neck supple.     Comments: Normal Muscle Bulk and Muscle Testing Reveals:  Upper Extremities: Right: Full ROM and Muscle Strength 5/5 Left Upper Extremity: Decreased ROM 20 Degrees and Muscle Strength 1/5 Lower Extremities: Full ROM and Muscle Strength 5/5 Arises from Table slowly Narrow Based  Gait   Skin:    General: Skin is warm and dry.  Neurological:     Mental Status: He is alert and oriented to person, place, and time.  Psychiatric:        Mood and Affect: Mood normal.        Behavior: Behavior normal.           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 Continue to monitor.  2. 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 Continue to Monitor.  3. Sacral Wound:  Wound Center Following. Continue to monitor.  4. Chronic Pain Syndrome: Refilled Oxycodone 10/325 mg mg one tablet every 6 hours as needed for pain #120.  We will continue the opioid monitoring program, this consists of regular clinic visits, examinations, urine drug screen, pill counts as well as use of [place] Controlled Substance Reporting system. A 12 month History has been reviewed on the [place] Controlled Substance Reporting System on 11/08/2020.   F/U in 1 month with Dr Carlis Abbott

## 2020-11-09 ENCOUNTER — Encounter (HOSPITAL_BASED_OUTPATIENT_CLINIC_OR_DEPARTMENT_OTHER): Payer: BC Managed Care – PPO | Attending: Internal Medicine | Admitting: Internal Medicine

## 2020-11-09 DIAGNOSIS — Z6841 Body Mass Index (BMI) 40.0 and over, adult: Secondary | ICD-10-CM | POA: Diagnosis not present

## 2020-11-09 DIAGNOSIS — U099 Post covid-19 condition, unspecified: Secondary | ICD-10-CM | POA: Diagnosis not present

## 2020-11-09 DIAGNOSIS — E1169 Type 2 diabetes mellitus with other specified complication: Secondary | ICD-10-CM | POA: Insufficient documentation

## 2020-11-09 DIAGNOSIS — M8668 Other chronic osteomyelitis, other site: Secondary | ICD-10-CM | POA: Insufficient documentation

## 2020-11-09 DIAGNOSIS — L89159 Pressure ulcer of sacral region, unspecified stage: Secondary | ICD-10-CM | POA: Diagnosis present

## 2020-11-09 DIAGNOSIS — G7281 Critical illness myopathy: Secondary | ICD-10-CM | POA: Diagnosis not present

## 2020-11-09 DIAGNOSIS — L89154 Pressure ulcer of sacral region, stage 4: Secondary | ICD-10-CM | POA: Insufficient documentation
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
## 2020-11-12 NOTE — Progress Notes (Signed)
Collin, Brown ([id number]) Visit Report for 11/09/2020 Arrival Information Details Patient Name: Date of Service: Collin Brown, [place] M. 11/09/2020 12:30 PM Medical Record Number: [id number] Patient Account Number: [id number] Date of Birth/Sex: Treating RN: 1985/11/20 (35 y.o. [name], [name] Woodford Strege: Laverna Peace Other Clinician: Referring Marleena Shubert: Treating Kazuko Clemence/Extender: Hessie Dibble, [name] Weeks in Treatment: 12 Visit Information History Since Last Visit Added or deleted any medications: No Patient Arrived: Ambulatory Any new allergies or adverse reactions: No Arrival Time: 12:51 Had a fall or experienced change in No Accompanied By: mom activities of daily living that may affect Transfer Assistance: None risk of falls: Patient Identification Verified: Yes Signs or symptoms of abuse/neglect since last visito No Secondary Verification Process Completed: Yes Hospitalized since last visit: No Patient Requires Transmission-Based Precautions: No Implantable device outside of the clinic excluding No Patient Has Alerts: No cellular tissue based products placed in the center since last visit: Has Dressing in Place as Prescribed: Yes Brown Present Now: Yes Electronic Signature(s) Signed: 11/09/2020 5:01:26 PM By: Rhae Hammock RN Entered By: Rhae Hammock on 11/09/2020 12:51:21 -------------------------------------------------------------------------------- Clinic Level of Care Assessment Details Patient Name: Date of Service: Collin Brown, [place] [name] 11/09/2020 12:30 PM Medical Record Number: [id number] Patient Account Number: [id number] Date of Birth/Sex: Treating RN: July 14, 1986 (35 y.o. [name] Primary Care [name]: Laverna Peace Other Clinician: Referring [name]: Treating Mahogani Holohan/Extender: Hessie Dibble, [name] Weeks in Treatment: 12 Clinic Level of Care Assessment Items TOOL 4 Quantity Score X- 1  0 Use when only an EandM is performed on FOLLOW-UP visit ASSESSMENTS - Nursing Assessment / Reassessment X- 1 10 Reassessment of Co-morbidities (includes updates in patient status) X- 1 5 Reassessment of Adherence to Treatment Plan ASSESSMENTS - Wound and Skin A ssessment / Reassessment X - Simple Wound Assessment / Reassessment - one wound 1 5 _0  - 0 Complex Wound Assessment / Reassessment - multiple wounds _1  - 0 Dermatologic / Skin Assessment (not related to wound area) ASSESSMENTS - Focused Assessment _2  - 0 Circumferential Edema Measurements - multi extremities _3  - 0 Nutritional Assessment / Counseling / Intervention _4  - 0 Lower Extremity Assessment (monofilament, tuning fork, pulses) _5  - 0 Peripheral Arterial Disease Assessment (using hand held doppler) ASSESSMENTS - Ostomy and/or Continence Assessment and Care _6  - 0 Incontinence Assessment and Management _7  - 0 Ostomy Care Assessment and Management (repouching, etc.) PROCESS - Coordination of Care _8  - 0 Simple Patient / Family Education for ongoing care X- 1 20 Complex (extensive) Patient / Family Education for ongoing care X- 1 10 Staff obtains Consents, Records, T Results / Process Orders est X- 1 10 Staff telephones HHA, Nursing Homes / Clarify orders / etc _9  - 0 Routine Transfer to another Facility (non-emergent condition) _10  - 0 Routine Hospital Admission (non-emergent condition) _11  - 0 New Admissions / Biomedical engineer / Ordering NPWT Apligraf, etc. , _12  - 0 Emergency Hospital Admission (emergent condition) _13  - 0 Simple Discharge Coordination _14  - 0 Complex (extensive) Discharge Coordination PROCESS - Special Needs _15  - 0 Pediatric / Minor Patient Management _16  - 0 Isolation Patient Management _17  - 0 Hearing / Language / Visual special needs _18  - 0 Assessment of Community assistance (transportation, D/C planning, etc.) _19  - 0 Additional assistance / Altered mentation _20  -  0 Support Surface(s) Assessment (bed, cushion, seat, etc.) INTERVENTIONS - Wound Cleansing / Measurement X - Simple Wound Cleansing - one wound 1 5 _21  - 0 Complex Wound Cleansing - multiple wounds  X- 1 5 Wound Imaging (photographs - any number of wounds) _0  - 0 Wound Tracing (instead of photographs) X- 1 5 Simple Wound Measurement - one wound _1  - 0 Complex Wound Measurement - multiple wounds INTERVENTIONS - Wound Dressings _2  - 0 Small Wound Dressing one or multiple wounds X- 1 15 Medium Wound Dressing one or multiple wounds _3  - 0 Large Wound Dressing one or multiple wounds X- 1 5 Application of Medications - topical <[id number]>_6<\/EPPIRJJOACZYSAYT>_0  - 0 Application of Medications - injection INTERVENTIONS - Miscellaneous _5  - 0 External ear exam _6  - 0 Specimen Collection (cultures, biopsies, blood, body fluids, etc.) _7  - 0 Specimen(s) / Culture(s) sent or taken to Lab for analysis _8  - 0 Patient Transfer (multiple staff / Civil Service fast streamer / Similar devices) _9  - 0 Simple Staple / Suture removal (25 or less) _10  - 0 Complex Staple / Suture removal (26 or more) _11  - 0 Hypo / Hyperglycemic Management (close monitor of Blood Glucose) _12  - 0 Ankle / Brachial Index (ABI) - do not check if billed separately X- 1 5 Vital Signs Has the patient been seen at the hospital within the last three years: Yes Total Score: 100 Level Of Care: New/Established - Level 3 Electronic Signature(s) Signed: 11/09/2020 5:18:29 PM By: Lorrin Jackson Entered By: Lorrin Jackson on 11/09/2020 13:12:41 -------------------------------------------------------------------------------- Encounter Discharge Information Details Patient Name: Date of Service: Collin Pain, [name] 11/09/2020 12:30 PM Medical Record Number: [id number] Patient Account Number: [id number] Date of Birth/Sex: Treating RN: [birth year] (35 y.o. [name] Primary Care Enzley Kitchens: Laverna Peace Other Clinician: Referring Hether Anselmo: Treating  Devonta Blanford/Extender: Hessie Dibble, [name] Weeks in Treatment: 12 Encounter Discharge Information Items Discharge Condition: Stable Ambulatory Status: Ambulatory Discharge Destination: Home Transportation: Private Auto Accompanied By: mother Schedule Follow-up Appointment: Yes Clinical Summary of Care: Electronic Signature(s) Signed: 11/09/2020 5:39:41 PM By: Deon Pilling Entered By: Deon Pilling on 11/09/2020 14:47:23 -------------------------------------------------------------------------------- Lower Extremity Assessment Details Patient Name: Date of Service: Collin Pain, [name] 11/09/2020 12:30 PM Medical Record Number: [id number] Patient Account Number: [id number] Date of Birth/Sex: Treating RN: [birth year] (35 y.o. [name], [name] Carma Dwiggins: Laverna Peace Other Clinician: Referring Klyde Banka: Treating Alphonso Gregson/Extender: Hessie Dibble, [name] Weeks in Treatment: 12 Electronic Signature(s) Signed: 11/09/2020 5:01:26 PM By: Rhae Hammock RN Entered By: Rhae Hammock on 11/09/2020 12:53:06 -------------------------------------------------------------------------------- Multi Wound Chart Details Patient Name: Date of Service: Collin Pain, [name] 11/09/2020 12:30 PM Medical Record Number: [id number] Patient Account Number: [id number] Date of Birth/Sex: Treating RN: 08/31/1985 (34 y.o. [name] Primary Care Kevis Qu: Laverna Peace Other Clinician: Referring Jahquan Klugh: Treating Rhythm Wigfall/Extender: Hessie Dibble, [name] Weeks in Treatment: 12 Vital Signs Height(in): 70 Pulse(bpm): 107 Weight(lbs): 341 Blood Pressure(mmHg): 110/68 Body Mass Index(BMI): 49 Temperature(F): 97.9 Respiratory Rate(breaths/min): 17 Photos: [1:No Photos Sacrum] [N/A:N/A N/A] Wound Location: [1:Pressure Injury] [N/A:N/A] Wounding Event: [1:Pressure Ulcer] [N/A:N/A] Primary Etiology: [1:Anemia, Sleep Apnea, Hypertension,  N/A] Comorbid History: [1:Type II Diabetes 05/18/2020] [N/A:N/A] Date Acquired: [1:12] [N/A:N/A] Weeks of Treatment: [1:Open] [N/A:N/A] Wound Status: [1:0.5x1x4.5] [N/A:N/A] Measurements L x W x D (cm) [1:0.393] [N/A:N/A] A (cm) : rea [1:1.767] [N/A:N/A] Volume (cm) : [1:72.50%] [N/A:N/A] % Reduction in A rea: [1:72.50%] [N/A:N/A] % Reduction in Volume: [1:Category/Stage IV] [N/A:N/A] Classification: [1:Small] [N/A:N/A] Exudate A mount: [1:Serosanguineous] [N/A:N/A] Exudate Type: [1:red, brown] [N/A:N/A] Exudate Color: [1:Distinct, outline attached] [N/A:N/A] Wound Margin: [1:Large (67-100%)] [N/A:N/A] Granulation A mount: [1:Red] [N/A:N/A] Granulation Quality: [1:None Present (0%)] [N/A:N/A] Necrotic A mount: [1:Fat Layer (Subcutaneous Tissue): Yes N/A] Exposed Structures: [  1:Fascia: No Tendon: No Muscle: No Joint: No Bone: No Small (1-33%)] [N/A:N/A] Treatment Notes Electronic Signature(s) Signed: 11/09/2020 5:01:47 PM By: Linton Ham MD Signed: 11/09/2020 5:18:29 PM By: Lorrin Jackson Entered By: Linton Ham on 11/09/2020 13:14:16 -------------------------------------------------------------------------------- Multi-Disciplinary Care Plan Details Patient Name: Date of Service: Collin Pain, [name] 11/09/2020 12:30 PM Medical Record Number: [id number] Patient Account Number: [id number] Date of Birth/Sex: Treating RN: April 19, 1986 (35 y.o. [name] Primary Care Jheri Mitter: Laverna Peace Other Clinician: Referring Blakelynn Scheeler: Treating Ronee Ranganathan/Extender: Effie Shy Weeks in Treatment: 12 Multidisciplinary Care Plan reviewed with physician Active Inactive Nutrition Nursing Diagnoses: Potential for alteratiion in Nutrition/Potential for imbalanced nutrition Goals: Patient/caregiver agrees to and verbalizes understanding of need to obtain nutritional consultation Date Initiated: 08/16/2020 Date Inactivated: 09/14/2020 Target Resolution Date:  09/14/2020 Goal Status: Met Patient/caregiver verbalizes understanding of need to maintain therapeutic glucose control per primary care physician Date Initiated: 08/16/2020 Target Resolution Date: 11/30/2020 Goal Status: Active Interventions: Assess HgA1c results as ordered upon admission and as needed Provide education on elevated blood sugars and impact on wound healing Provide education on nutrition Treatment Activities: Obtain HgA1c : 08/16/2020 Patient referred to Primary Care Physician for further nutritional evaluation : 08/16/2020 Notes: Osteomyelitis Nursing Diagnoses: Infection: osteomyelitis Knowledge deficit related to disease process and management Goals: Patient's osteomyelitis will resolve Date Initiated: 10/05/2020 Target Resolution Date: 11/23/2020 Goal Status: Active Interventions: Assess for signs and symptoms of osteomyelitis resolution every visit Provide education on osteomyelitis Treatment Activities: Systemic antibiotics : 10/05/2020 Notes: 11/09/20: continues on IV antibiotics Pressure Nursing Diagnoses: Knowledge deficit related to management of pressures ulcers Potential for impaired tissue integrity related to pressure, friction, moisture, and shear Goals: Patient will remain free from development of additional pressure ulcers Date Initiated: 08/16/2020 Date Inactivated: 09/14/2020 Target Resolution Date: 09/14/2020 Goal Status: Met Patient/caregiver will verbalize understanding of pressure ulcer management Date Initiated: 08/16/2020 Target Resolution Date: 12/07/2020 Goal Status: Active Interventions: Assess: immobility, friction, shearing, incontinence upon admission and as needed Assess potential for pressure ulcer upon admission and as needed Provide education on pressure ulcers Treatment Activities: Consult for HBO : 08/16/2020 Pressure reduction/relief device ordered : 08/16/2020 T ordered outside of clinic : 08/16/2020 est Notes: Wound/Skin  Impairment Nursing Diagnoses: Knowledge deficit related to ulceration/compromised skin integrity Goals: Patient/caregiver will verbalize understanding of skin care regimen Date Initiated: 08/16/2020 Target Resolution Date: 12/07/2020 Goal Status: Active Interventions: Assess patient/caregiver ability to perform ulcer/skin care regimen upon admission and as needed Assess ulceration(s) every visit Provide education on ulcer and skin care Treatment Activities: Skin care regimen initiated : 08/16/2020 Topical wound management initiated : 08/16/2020 Notes: Electronic Signature(s) Signed: 11/09/2020 12:53:37 PM By: Lorrin Jackson Entered By: Lorrin Jackson on 11/09/2020 12:53:37 -------------------------------------------------------------------------------- Brown Assessment Details Patient Name: Date of Service: Collin Pain, [name] 11/09/2020 12:30 PM Medical Record Number: [id number] Patient Account Number: [id number] Date of Birth/Sex: Treating RN: Dec 26, 1985 (35 y.o. [name], [name] Keyanah Kozicki: Laverna Peace Other Clinician: Referring Claudean Leavelle: Treating Arisbel Maione/Extender: Hessie Dibble, [name] Weeks in Treatment: 12 Active Problems Location of Brown Severity and Description of Brown Patient Has Paino Yes Site Locations Brown Location: Brown in Ulcers With Dressing Change: No Duration of the Brown. Constant / Intermittento Intermittent Rate the Brown. Current Brown Level: 2 Worst Brown Level: 10 Least Brown Level: 0 Tolerable Brown Level: 2 Character of Brown Describe the Brown: Aching Brown Management and Medication Current Brown Management: Medication: Yes Cold Application: No Rest: No Massage: No Activity: No T.E.N.S.: No Heat  Application: No Leg drop or elevation: No Is the Current Brown Management Adequate: Adequate How does your wound impact your activities of daily livingo Sleep: No Bathing: No Appetite: No Relationship With Others:  No Bladder Continence: No Emotions: No Bowel Continence: No Work: No Toileting: No Drive: No Dressing: No Hobbies: No Electronic Signature(s) Signed: 11/09/2020 5:01:26 PM By: Rhae Hammock RN Entered By: Rhae Hammock on 11/09/2020 12:53:00 -------------------------------------------------------------------------------- Patient/Caregiver Education Details Patient Name: Date of Service: Collin Pain, [name] 4/1/2022andnbsp12:30 PM Medical Record Number: [id number] Patient Account Number: [id number] Date of Birth/Gender: Treating RN: 1986-07-09 (35 y.o. [name] Primary Care Physician: Laverna Peace Other Clinician: Referring Physician: Treating Physician/Extender: Flonnie Hailstone in Treatment: 12 Education Assessment Education Provided To: Patient Education Topics Provided Elevated Blood Sugar/ Impact on Healing: Methods: Explain/Verbal, Printed Responses: State content correctly Infection: Methods: Explain/Verbal Responses: State content correctly Pressure: Methods: Explain/Verbal Responses: State content correctly Wound/Skin Impairment: Methods: Explain/Verbal, Printed Responses: State content correctly Electronic Signature(s) Signed: 11/09/2020 5:18:29 PM By: Lorrin Jackson Entered By: Lorrin Jackson on 11/09/2020 12:54:06 -------------------------------------------------------------------------------- Wound Assessment Details Patient Name: Date of Service: Collin Pain, [name] 11/09/2020 12:30 PM Medical Record Number: [id number] Patient Account Number: [id number] Date of Birth/Sex: Treating RN: [birth year] (34 y.o. [name] Primary Care Mallary Kreger: Laverna Peace Other Clinician: Referring Jalayah Gutridge: Treating Kaylla Cobos/Extender: Hessie Dibble, [name] Weeks in Treatment: 12 Wound Status Wound Number: 1 Primary Etiology: Pressure Ulcer Wound Location: Sacrum Wound Status: Open Wounding Event:  Pressure Injury Comorbid History: Anemia, Sleep Apnea, Hypertension, Type II Diabetes Date Acquired: 05/18/2020 Weeks Of Treatment: 12 Clustered Wound: No Photos Wound Measurements Length: (cm) 0.5 Width: (cm) 1 Depth: (cm) 4.5 Area: (cm) 0.393 Volume: (cm) 1.767 % Reduction in Area: 72.5% % Reduction in Volume: 72.5% Epithelialization: Small (1-33%) Tunneling: No Undermining: No Wound Description Classification: Category/Stage IV Wound Margin: Distinct, outline attached Exudate Amount: Small Exudate Type: Serosanguineous Exudate Color: red, brown Foul Odor After Cleansing: No Slough/Fibrino No Wound Bed Granulation Amount: Large (67-100%) Exposed Structure Granulation Quality: Red Fascia Exposed: No Necrotic Amount: None Present (0%) Fat Layer (Subcutaneous Tissue) Exposed: Yes Tendon Exposed: No Muscle Exposed: No Joint Exposed: No Bone Exposed: No Treatment Notes Wound #1 (Sacrum) Cleanser Normal Saline Discharge Instruction: Cleanse the wound with Normal Saline prior to applying a clean dressing using gauze sponges, not tissue or cotton balls. Normal Saline Discharge Instruction: Cleanse the wound with Normal Saline prior to applying a clean dressing using gauze sponges, not tissue or cotton balls. Peri-Wound Care Skin Prep Discharge Instruction: Use skin prep as directed Topical Primary Dressing KerraCel Ag Gelling Fiber Dressing, 4x5 in (silver alginate) Discharge Instruction: Apply silver alginate packed lightly into wound Secondary Dressing ComfortFoam Border, 6x6 in (silicone border) Discharge Instruction: Apply over primary dressing as directed. Secured With Compression Wrap Compression Stockings Environmental education officer) Signed: 11/09/2020 5:18:29 PM By: Lorrin Jackson Signed: 11/12/2020 10:23:55 AM By: Sandre Kitty Entered By: Sandre Kitty on 11/09/2020  16:42:46 -------------------------------------------------------------------------------- Vitals Details Patient Name: Date of Service: Collin Pain, [name] 11/09/2020 12:30 PM Medical Record Number: [id number] Patient Account Number: [id number] Date of Birth/Sex: Treating RN: 17-Jan-1986 (35 y.o. [name], [name] Gwenette Wellons: Laverna Peace Other Clinician: Referring Iyannah Blake: Treating Arjuna Doeden/Extender: Hessie Dibble, [name] Weeks in Treatment: 12 Vital Signs Time Taken: 12:51 Temperature (F): 97.9 Height (in): 70 Pulse (bpm): 107 Weight (lbs): 341 Respiratory Rate (breaths/min): 17 Body Mass Index (BMI): 48.9 Blood Pressure (mmHg): 110/68 Reference Range: 80 - 120  mg / dl Electronic Signature(s) Signed: 11/09/2020 5:01:26 PM By: Rhae Hammock RN Entered By: Rhae Hammock on 11/09/2020 12:52:18

## 2020-11-14 ENCOUNTER — Telehealth: Payer: Self-pay

## 2020-11-14 NOTE — Telephone Encounter (Signed)
Verbal okay approved for occupational therapy once a week for 4 weeks. Caller was Nicholaus Corolla RN with [facility].  (PH# [phone])

## 2020-11-20 ENCOUNTER — Other Ambulatory Visit: Payer: Self-pay

## 2020-11-20 ENCOUNTER — Ambulatory Visit (INDEPENDENT_AMBULATORY_CARE_PROVIDER_SITE_OTHER): Payer: BC Managed Care – PPO | Admitting: Internal Medicine

## 2020-11-20 ENCOUNTER — Encounter: Payer: Self-pay | Admitting: Internal Medicine

## 2020-11-20 VITALS — HR 99 | Temp 98.0°F | Wt 378.0 lb

## 2020-11-20 DIAGNOSIS — Z79899 Other long term (current) drug therapy: Secondary | ICD-10-CM | POA: Diagnosis not present

## 2020-11-20 DIAGNOSIS — M4628 Osteomyelitis of vertebra, sacral and sacrococcygeal region: Secondary | ICD-10-CM

## 2020-11-20 NOTE — Progress Notes (Signed)
[facility]  CHIEF COMPLAINT:    Follow up for sacral OM  SUBJECTIVE:    Collin Brown is a 35 y.o. male with PMHx as below who presents to the clinic for follow up of sacral OM.   Please see A&P for the details of today's visit and status of the patient's medical problems.   Patient's Medications  New Prescriptions   No medications on file  Previous Medications   ASCORBIC ACID (VITAMIN C) 500 MG TABLET    Take 1 tablet (500 mg total) by mouth daily.   ASPIRIN 81 MG CHEWABLE TABLET    Chew 1 tablet (81 mg total) by mouth daily.   BLOOD GLUCOSE METER KIT AND SUPPLIES    Dispense based on patient and insurance preference. Use up to four times daily as directed. (FOR ICD-10 E10.9, E11.9).   FLUTICASONE-SALMETEROL (ADVAIR DISKUS) 250-50 MCG/DOSE AEPB    Inhale 1 puff into the lungs 2 (two) times daily.   GABAPENTIN (NEURONTIN) 800 MG TABLET    Take 1 tablet (800 mg total) by mouth 4 (four) times daily as needed.   INSULIN LISPRO (HUMALOG KWIKPEN) 100 UNIT/ML KWIKPEN    Inject 4 Units into the skin 3 (three) times daily. Take with meals (as long as blood sugars are over 80)   INSULIN PEN NEEDLE (CAREFINE PEN NEEDLES) 32G X 6 MM MISC    1 applicator by Does not apply route 4 (four) times daily -  with meals and at bedtime.   LANTUS SOLOSTAR 100 UNIT/ML SOLOSTAR PEN    Inject into the skin.   METOPROLOL TARTRATE (LOPRESSOR) 50 MG TABLET    Take 1.5 tablets (75 mg total) by mouth 2 (two) times daily.   METRONIDAZOLE (FLAGYL) 500 MG TABLET    Take 1 tablet (500 mg total) by mouth 3 (three) times daily.   MULTIPLE VITAMIN (MULTIVITAMIN WITH MINERALS) TABS TABLET    Take 1 tablet by mouth daily.   ONDANSETRON (ZOFRAN) 4 MG TABLET    Take 1 tablet (4 mg total) by mouth every 8 (eight) hours as needed for nausea or vomiting.   OXYCODONE-ACETAMINOPHEN (PERCOCET) 10-325 MG TABLET    Take 1 tablet by mouth every 6 (six) hours as needed for pain.   POTASSIUM  CHLORIDE SA (KLOR-CON) 20 MEQ TABLET    Take 2 tablets by mouth twice daily   SEMAGLUTIDE,0.25 OR 0.5MG/DOS, 2 MG/1.5ML SOPN    Inject into the skin.   SENNA-DOCUSATE (SENOKOT-S) 8.6-50 MG TABLET    Take 2 tablets by mouth 2 (two) times daily.  Modified Medications   No medications on file  Discontinued Medications   AMOXICILLIN-CLAVULANATE (AUGMENTIN) 875-125 MG TABLET    amoxicillin 875 mg-potassium clavulanate 125 mg tablet  TAKE 1 TABLET BY MOUTH TWICE DAILY FOR 7 DAYS      Past Medical History:  Diagnosis Date  . Acute blood loss anemia   . Critical illness myopathy   . Diabetes mellitus without complication ([place])   . Dyspnea 08/23/2020  . Hearing loss in right ear   . Hypertension   . Sleep apnea     Social History   Tobacco Use  . Smoking status: Never Smoker  . Smokeless tobacco: Never Used  Vaping Use  . Vaping Use: Never used  Substance Use Topics  . Alcohol use: No  . Drug use: No    Family History  Problem Relation Age of Onset  . Diabetes  Mellitus II Mother   . Hypertension Mother   . Diabetes Mother   . Diabetes Mellitus II Father   . High blood pressure Father   . Heart attack Father     No Known Allergies  Review of Systems  Constitutional: Negative.   Respiratory: Negative.   Cardiovascular: Negative.   Gastrointestinal: Negative.   Musculoskeletal:       Left arm pain from nerve injury.     OBJECTIVE:    Vitals:   11/20/20 1117  Pulse: 99  Temp: 98 F (36.7 C)  TempSrc: Oral  SpO2: 95%  Weight: (!) 378 lb (171.5 kg)   Body mass index is 52.72 kg/m.  Physical Exam Constitutional:      Appearance: Normal appearance. He is obese.  Pulmonary:     Effort: Pulmonary effort is normal. No respiratory distress.  Musculoskeletal:     Comments: Right UE picc line  Skin:    General: Skin is warm and dry.  Neurological:     General: No focal deficit present.     Mental Status: He is alert and oriented to person, place, and time.   Psychiatric:        Mood and Affect: Mood normal.        Behavior: Behavior normal.      Labs and Microbiology: CBC Latest Ref Rng & Units 10/15/2020 08/23/2020 07/09/2020  WBC 3.8 - 10.8 Thousand/uL 7.7 7.9 7.5  Hemoglobin 13.2 - 17.1 g/dL 14.4 13.4 11.5(L)  Hematocrit 38.5 - 50.0 % 43.2 41.5 37.0(L)  Platelets 140 - 400 Thousand/uL 278 294 339   CMP Latest Ref Rng & Units 10/15/2020 08/23/2020 07/06/2020  Glucose 65 - 99 mg/dL 244(H) 108(H) 104(H)  BUN 7 - 25 mg/dL _0 Creatinine 0.60 - 1.35 mg/dL 0.73 0.57(L) 0.56(L)  Sodium 135 - 146 mmol/L 137 139 137  Potassium 3.5 - 5.3 mmol/L 4.4 4.7 4.0  Chloride 98 - 110 mmol/L 102 102 99  CO2 20 - 32 mmol/L _1 Calcium 8.6 - 10.3 mg/dL 10.2 10.0 9.6  Total Protein 6.1 - 8.1 g/dL 6.5 6.3 -  Total Bilirubin 0.2 - 1.2 mg/dL 0.3 0.3 -  Alkaline Phos 44 - 121 IU/L - 68 -  AST 10 - 40 U/L 17 17 -  ALT 9 - 46 U/L 30 26 -     No results found for this or any previous visit (from the past 240 hour(s)).     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  He has been on ceftriaxone, daptomycin, and flagyl for about 5 weeks (PICC placed 10/19/20) and is tolerating well.  He had an issue with PICC line about 2 weeks ago and was evaluated by IR who were able to resolve the problem.  His mom has been helping with wound care and they have noted improvement.  He continues to see Dr Dellia Nims.  He is also now able to sit on his tail bone for longer than 1.5 hrs whereas before he could only tolerate about 15 minutes.  He reports the Flagyl tastes awful but is otherwise tolerating antibiotics well and has no N/V/D and no fevers.  OPAT labs reviewed and have been stable although ESR/CRP were not done recently.  When last checked as a baseline prior to IV therapy his ESR was 25 and CRP 19. Will repeat today to compare now that  he is nearing 6 weeks of  therapy.  I will see him back next week to discuss completing therapy vs extending.  Encounter for long-term current use of medication Continue OPAT lab monitoring while on therapy.   Orders Placed This Encounter  Procedures  . Sedimentation rate  . C-reactive protein        [facility] 11/20/2020, 11:53 AM

## 2020-11-20 NOTE — Assessment & Plan Note (Signed)
Pt with longstanding sacral decub ulcer after prolonged admission for COVID in the Fall 2021.  He has been on ceftriaxone, daptomycin, and flagyl for about 5 weeks (PICC placed 10/19/20) and is tolerating well.  He had an issue with PICC line about 2 weeks ago and was evaluated by IR who were able to resolve the problem.  His mom has been helping with wound care and they have noted improvement.  He continues to see Dr Robson.  He is also now able to sit on his tail bone for longer than 1.5 hrs whereas before he could only tolerate about 15 minutes.  He reports the Flagyl tastes awful but is otherwise tolerating antibiotics well and has no N/V/D and no fevers.  OPAT labs reviewed and have been stable although ESR/CRP were not done recently.  When last checked as a baseline prior to IV therapy his ESR was 25 and CRP 19. Will repeat today to compare now that he is nearing 6 weeks of therapy.  I will see him back next week to discuss completing therapy vs extending. 

## 2020-11-20 NOTE — Assessment & Plan Note (Signed)
Continue OPAT lab monitoring while on therapy.

## 2020-11-20 NOTE — Patient Instructions (Addendum)
Please call [facility] if you decide to seek counseling at [phone]   Thank you for coming to see me today. It was a pleasure seeing you.  To Do: Marland Kitchen Continue antibiotics (Ceftriaxone and Daptomycin via PICC; metronidazole orally) . Labs today . See me again next week for determination on whether to continue antibiotics or stop at 6 weeks  If you have any questions or concerns, please do not hesitate to call the office at [phone].  Take Care,   Gwynn Burly, DO

## 2020-11-20 NOTE — Progress Notes (Signed)
RN drew labs from PICC per Dr. Earlene Plater. Noted blood return and line flushed well. Extension tubing and cap changed, line flushed with saline and heparin and clamped. Dressing dry and intact.   Sandie Ano, RN

## 2020-11-21 ENCOUNTER — Other Ambulatory Visit: Payer: Self-pay | Admitting: Adult Health

## 2020-11-21 DIAGNOSIS — E1169 Type 2 diabetes mellitus with other specified complication: Secondary | ICD-10-CM

## 2020-11-21 LAB — C-REACTIVE PROTEIN: CRP: 13.7 mg/L — ABNORMAL HIGH (ref <8.0)

## 2020-11-21 LAB — SEDIMENTATION RATE: Sed Rate: 22 mm/h — ABNORMAL HIGH (ref 0–15)

## 2020-11-23 ENCOUNTER — Other Ambulatory Visit: Payer: Self-pay

## 2020-11-23 ENCOUNTER — Encounter (HOSPITAL_BASED_OUTPATIENT_CLINIC_OR_DEPARTMENT_OTHER): Payer: BC Managed Care – PPO | Admitting: Internal Medicine

## 2020-11-23 DIAGNOSIS — L89154 Pressure ulcer of sacral region, stage 4: Secondary | ICD-10-CM | POA: Diagnosis not present
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
## 2020-11-26 ENCOUNTER — Telehealth: Payer: Self-pay

## 2020-11-26 NOTE — Telephone Encounter (Signed)
-----   Message from Kathlynn Grate, DO sent at 11/23/2020  2:50 PM EDT ----- Please let pt know that inflammatory markers are stable.  Will follow up with me 4/22 to discuss next steps.  Thanks, Greig Castilla

## 2020-11-26 NOTE — Telephone Encounter (Signed)
Attempted to call patient, no answer. Left HIPAA compliant voicemail requesting callback.   Mckenna Gamm D Lynze Reddy, RN  

## 2020-11-27 ENCOUNTER — Other Ambulatory Visit: Payer: Self-pay | Admitting: Neurosurgery

## 2020-11-27 DIAGNOSIS — S143XXA Injury of brachial plexus, initial encounter: Secondary | ICD-10-CM

## 2020-11-27 NOTE — Progress Notes (Signed)
Collin Brown, Collin Brown ([id number]) Visit Report for 11/23/2020 Arrival Information Details Patient Name: Date of Service: Collin Brown, [place] M. 11/23/2020 12:30 PM Medical Record Number: [id number] Patient Account Number: [id number] Date of Birth/Sex: Treating RN: 10/22/1985 (35 y.o. [name], [name] Zia Najera: Laverna Peace Other Clinician: Referring Desteni Piscopo: Treating Hadia Minier/Extender: Hessie Dibble, [name] Weeks in Treatment: 14 Visit Information History Since Last Visit Added or deleted any medications: No Patient Arrived: Ambulatory Any new allergies or adverse reactions: No Arrival Time: 12:38 Had a fall or experienced change in No Accompanied By: self activities of daily living that may affect Transfer Assistance: None risk of falls: Patient Identification Verified: Yes Signs or symptoms of abuse/neglect since last visito No Secondary Verification Process Completed: Yes Hospitalized since last visit: No Patient Requires Transmission-Based Precautions: No Implantable device outside of the clinic excluding No Patient Has Alerts: No cellular tissue based products placed in the center since last visit: Has Dressing in Place as Prescribed: Yes Brown Present Now: Yes Electronic Signature(s) Signed: 11/23/2020 5:30:00 PM By: Rhae Hammock RN Entered By: Rhae Hammock on 11/23/2020 12:39:14 -------------------------------------------------------------------------------- Encounter Discharge Information Details Patient Name: Date of Service: Collin Pain, Collin Collin M. 11/23/2020 12:30 PM Medical Record Number: [id number] Patient Account Number: [id number] Date of Birth/Sex: Treating RN: Apr 30, 1986 (35 y.o. [name] Primary Care Amelita Risinger: Laverna Peace Other Clinician: Referring Dejan Angert: Treating Kaelyn Nauta/Extender: Hessie Dibble, [name] Weeks in Treatment: 14 Encounter Discharge Information Items Post Procedure  Vitals Discharge Condition: Stable Temperature (F): 97.9 Ambulatory Status: Ambulatory Pulse (bpm): 84 Discharge Destination: Home Respiratory Rate (breaths/min): 17 Transportation: Private Auto Blood Pressure (mmHg): 124/74 Accompanied By: mother Schedule Follow-up Appointment: Yes Clinical Summary of Care: Electronic Signature(s) Signed: 11/23/2020 5:54:45 PM By: Deon Pilling Entered By: Deon Pilling on 11/23/2020 13:08:46 -------------------------------------------------------------------------------- Lower Extremity Assessment Details Patient Name: Date of Service: Collin Brown, [place] Collin M. 11/23/2020 12:30 PM Medical Record Number: [id number] Patient Account Number: [id number] Date of Birth/Sex: Treating RN: Dec 14, 1985 (35 y.o. [name], [name] Emalynn Clewis: Laverna Peace Other Clinician: Referring Quynn Vilchis: Treating Trenton Verne/Extender: Hessie Dibble, [name] Weeks in Treatment: 14 Electronic Signature(s) Signed: 11/23/2020 5:30:00 PM By: Rhae Hammock RN Entered By: Rhae Hammock on 11/23/2020 12:40:03 -------------------------------------------------------------------------------- Multi Wound Chart Details Patient Name: Date of Service: Collin Pain, Collin Collin M. 11/23/2020 12:30 PM Medical Record Number: [id number] Patient Account Number: [id number] Date of Birth/Sex: Treating RN: 1986-04-21 (35 y.o. [name] Primary Care Ivyrose Hashman: Laverna Peace Other Clinician: Referring Ikea Demicco: Treating Tyliek Timberman/Extender: Hessie Dibble, [name] Weeks in Treatment: 14 Vital Signs Height(in): 70 Pulse(bpm): 65 Weight(lbs): 341 Blood Pressure(mmHg): 124/74 Body Mass Index(BMI): 49 Temperature(F): 97.9 Respiratory Rate(breaths/min): 17 Photos: [1:No Photos Sacrum] [N/A:N/A N/A] Wound Location: [1:Pressure Injury] [N/A:N/A] Wounding Event: [1:Pressure Ulcer] [N/A:N/A] Primary Etiology: [1:Anemia, Sleep Apnea, Hypertension,  N/A] Comorbid History: [1:Type II Diabetes 05/18/2020] [N/A:N/A] Date Acquired: [1:14] [N/A:N/A] Weeks of Treatment: [1:Open] [N/A:N/A] Wound Status: [1:0.4x0.2x4.7] [N/A:N/A] Measurements L x W x D (cm) [1:0.063] [N/A:N/A] A (cm) : rea [1:0.295] [N/A:N/A] Volume (cm) : [1:95.60%] [N/A:N/A] % Reduction in A [1:rea: 95.40%] [N/A:N/A] % Reduction in Volume: [1:Category/Stage IV] [N/A:N/A] Classification: [1:Small] [N/A:N/A] Exudate A mount: [1:Serosanguineous] [N/A:N/A] Exudate Type: [1:red, brown] [N/A:N/A] Exudate Color: [1:Distinct, outline attached] [N/A:N/A] Wound Margin: [1:Large (67-100%)] [N/A:N/A] Granulation A mount: [1:Red] [N/A:N/A] Granulation Quality: [1:None Present (0%)] [N/A:N/A] Necrotic A mount: [1:Fat Layer (Subcutaneous Tissue): Yes N/A] Exposed Structures: [1:Fascia: No Tendon: No Muscle: No Joint: No Bone: No Small (1-33%)] [N/A:N/A] Epithelialization: [1:Debridement - Excisional] [N/A:N/A] Debridement: Pre-procedure  Verification/Time Out 12:50 [N/A:N/A] Taken: [1:Other] [N/A:N/A] Brown Control: [1:Subcutaneous] [N/A:N/A] Tissue Debrided: [1:Skin/Subcutaneous Tissue] [N/A:N/A] Level: [1:0.08] [N/A:N/A] Debridement A (sq cm): [1:rea Curette] [N/A:N/A] Instrument: [1:Moderate] [N/A:N/A] Bleeding: [1:Silver Nitrate] [N/A:N/A] Hemostasis A chieved: [1:Procedure was tolerated well] [N/A:N/A] Debridement Treatment Response: [1:0.4x0.2x4.7] [N/A:N/A] Post Debridement Measurements L x W x D (cm) [1:0.295] [N/A:N/A] Post Debridement Volume: (cm) [1:Category/Stage IV] [N/A:N/A] Post Debridement Stage: [1:Debridement] [N/A:N/A] Treatment Notes Electronic Signature(s) Signed: 11/23/2020 4:57:27 PM By: Linton Ham MD Signed: 11/23/2020 5:21:53 PM By: Lorrin Jackson Entered By: Linton Ham on 11/23/2020 13:02:00 -------------------------------------------------------------------------------- Multi-Disciplinary Care Plan Details Patient Name: Date of  Service: Collin Pain, Collin Collin M. 11/23/2020 12:30 PM Medical Record Number: [id number] Patient Account Number: [id number] Date of Birth/Sex: Treating RN: 03-Feb-1986 (35 y.o. [name] Primary Care Jaylenne Hamelin: Laverna Peace Other Clinician: Referring Haden Cavenaugh: Treating Ludmilla Mcgillis/Extender: Effie Shy Weeks in Treatment: 14 Multidisciplinary Care Plan reviewed with physician Active Inactive Nutrition Nursing Diagnoses: Potential for alteratiion in Nutrition/Potential for imbalanced nutrition Goals: Patient/caregiver agrees to and verbalizes understanding of need to obtain nutritional consultation Date Initiated: 08/16/2020 Date Inactivated: 09/14/2020 Target Resolution Date: 09/14/2020 Goal Status: Met Patient/caregiver verbalizes understanding of need to maintain therapeutic glucose control per primary care physician Date Initiated: 08/16/2020 Target Resolution Date: 11/30/2020 Goal Status: Active Interventions: Assess HgA1c results as ordered upon admission and as needed Provide education on elevated blood sugars and impact on wound healing Provide education on nutrition Treatment Activities: Obtain HgA1c : 08/16/2020 Patient referred to Primary Care Physician for further nutritional evaluation : 08/16/2020 Notes: Osteomyelitis Nursing Diagnoses: Infection: osteomyelitis Knowledge deficit related to disease process and management Goals: Patient's osteomyelitis will resolve Date Initiated: 10/05/2020 Target Resolution Date: 11/23/2020 Goal Status: Active Interventions: Assess for signs and symptoms of osteomyelitis resolution every visit Provide education on osteomyelitis Treatment Activities: Systemic antibiotics : 10/05/2020 Notes: 11/09/20: continues on IV antibiotics Pressure Nursing Diagnoses: Knowledge deficit related to management of pressures ulcers Potential for impaired tissue integrity related to pressure, friction, moisture, and  shear Goals: Patient will remain free from development of additional pressure ulcers Date Initiated: 08/16/2020 Date Inactivated: 09/14/2020 Target Resolution Date: 09/14/2020 Goal Status: Met Patient/caregiver will verbalize understanding of pressure ulcer management Date Initiated: 08/16/2020 Target Resolution Date: 12/07/2020 Goal Status: Active Interventions: Assess: immobility, friction, shearing, incontinence upon admission and as needed Assess potential for pressure ulcer upon admission and as needed Provide education on pressure ulcers Treatment Activities: Consult for HBO : 08/16/2020 Pressure reduction/relief device ordered : 08/16/2020 T ordered outside of clinic : 08/16/2020 est Notes: Wound/Skin Impairment Nursing Diagnoses: Knowledge deficit related to ulceration/compromised skin integrity Goals: Patient/caregiver will verbalize understanding of skin care regimen Date Initiated: 08/16/2020 Target Resolution Date: 12/07/2020 Goal Status: Active Interventions: Assess patient/caregiver ability to perform ulcer/skin care regimen upon admission and as needed Assess ulceration(s) every visit Provide education on ulcer and skin care Treatment Activities: Skin care regimen initiated : 08/16/2020 Topical wound management initiated : 08/16/2020 Notes: Electronic Signature(s) Signed: 11/23/2020 12:48:19 PM By: Lorrin Jackson Entered By: Lorrin Jackson on 11/23/2020 12:48:19 -------------------------------------------------------------------------------- Brown Assessment Details Patient Name: Date of Service: Collin Pain, Collin Collin M. 11/23/2020 12:30 PM Medical Record Number: [id number] Patient Account Number: [id number] Date of Birth/Sex: Treating RN: [birth year] (35 y.o. [name], [name] Calbert Hulsebus: Laverna Peace Other Clinician: Referring Charleston Vierling: Treating Zebulan Hinshaw/Extender: Hessie Dibble, [name] Weeks in Treatment: 14 Active Problems Location of Brown  Severity and Description of Brown Patient Has Paino Yes Site Locations Brown Location: Brown in Ulcers With Dressing  Change: Yes Duration of the Brown. Constant / Intermittento Intermittent Rate the Brown. Current Brown Level: 2 Worst Brown Level: 10 Least Brown Level: 0 Tolerable Brown Level: 7 Character of Brown Describe the Brown: Aching Brown Management and Medication Current Brown Management: Medication: Yes Cold Application: No Rest: Yes Massage: No Activity: No T.E.N.S.: No Heat Application: No Leg drop or elevation: No Is the Current Brown Management Adequate: Adequate How does your wound impact your activities of daily livingo Sleep: No Bathing: No Appetite: No Relationship With Others: No Bladder Continence: No Emotions: No Bowel Continence: No Work: No Toileting: No Drive: No Dressing: No Hobbies: No Electronic Signature(s) Signed: 11/23/2020 5:30:00 PM By: Rhae Hammock RN Entered By: Rhae Hammock on 11/23/2020 12:39:56 -------------------------------------------------------------------------------- Patient/Caregiver Education Details Patient Name: Date of Service: Collin Pain, Collin Collin M. 4/15/2022andnbsp12:30 PM Medical Record Number: [id number] Patient Account Number: [id number] Date of Birth/Gender: Treating RN: 1985-10-16 (35 y.o. [name] Primary Care Physician: Laverna Peace Other Clinician: Referring Physician: Treating Physician/Extender: Effie Shy Weeks in Treatment: 14 Education Assessment Education Provided To: Patient Education Topics Provided Elevated Blood Sugar/ Impact on Healing: Methods: Explain/Verbal, Printed Responses: State content correctly Infection: Methods: Explain/Verbal Responses: State content correctly Offloading: Methods: Explain/Verbal Responses: State content correctly Wound/Skin Impairment: Methods: Explain/Verbal, Printed Responses: State content correctly Electronic  Signature(s) Signed: 11/23/2020 5:21:53 PM By: Lorrin Jackson Entered By: Lorrin Jackson on 11/23/2020 12:49:05 -------------------------------------------------------------------------------- Wound Assessment Details Patient Name: Date of Service: Collin Pain, Collin Collin M. 11/23/2020 12:30 PM Medical Record Number: [id number] Patient Account Number: [id number] Date of Birth/Sex: Treating RN: Aug 23, 1985 (35 y.o. [name], [name] Kassey Laforest: Laverna Peace Other Clinician: Referring Giovanna Kemmerer: Treating Brylei Pedley/Extender: Hessie Dibble, [name] Weeks in Treatment: 14 Wound Status Wound Number: 1 Primary Etiology: Pressure Ulcer Wound Location: Sacrum Wound Status: Open Wounding Event: Pressure Injury Comorbid History: Anemia, Sleep Apnea, Hypertension, Type II Diabetes Date Acquired: 05/18/2020 Weeks Of Treatment: 14 Clustered Wound: No Photos Wound Measurements Length: (cm) 0.4 Width: (cm) 0.2 Depth: (cm) 4.7 Area: (cm) 0.063 Volume: (cm) 0.295 % Reduction in Area: 95.6% % Reduction in Volume: 95.4% Epithelialization: Small (1-33%) Tunneling: No Undermining: No Wound Description Classification: Category/Stage IV Wound Margin: Distinct, outline attached Exudate Amount: Small Exudate Type: Serosanguineous Exudate Color: red, brown Wound Bed Granulation Amount: Large (67-100%) Granulation Quality: Red Necrotic Amount: None Present (0%) Foul Odor After Cleansing: No Slough/Fibrino No Exposed Structure Fascia Exposed: No Fat Layer (Subcutaneous Tissue) Exposed: Yes Tendon Exposed: No Muscle Exposed: No Joint Exposed: No Bone Exposed: No Treatment Notes Wound #1 (Sacrum) Cleanser Normal Saline Discharge Instruction: Cleanse the wound with Normal Saline prior to applying a clean dressing using gauze sponges, not tissue or cotton balls. Normal Saline Discharge Instruction: Cleanse the wound with Normal Saline prior to applying a clean  dressing using gauze sponges, not tissue or cotton balls. Peri-Wound Care Skin Prep Discharge Instruction: Use skin prep as directed Topical Primary Dressing KerraCel Ag Gelling Fiber Dressing, 4x5 in (silver alginate) Discharge Instruction: Apply silver alginate packed lightly into wound Secondary Dressing ComfortFoam Border, 6x6 in (silicone border) Discharge Instruction: Apply over primary dressing as directed. Secured With Compression Wrap Compression Stockings Environmental education officer) Signed: 11/23/2020 5:30:00 PM By: Rhae Hammock RN Signed: 11/27/2020 8:07:32 AM By: Sandre Kitty Entered By: Sandre Kitty on 11/23/2020 16:38:43 -------------------------------------------------------------------------------- Vitals Details Patient Name: Date of Service: Collin Pain, Collin Collin M. 11/23/2020 12:30 PM Medical Record Number: [id number] Patient Account Number: [id number] Date of Birth/Sex: Treating RN: 1986/04/12 (35 y.o.  [name], [name] Jeran Hiltz: Laverna Peace Other Clinician: Referring Lekisha Mcghee: Treating Chanell Nadeau/Extender: Hessie Dibble, [name] Weeks in Treatment: 14 Vital Signs Time Taken: 12:39 Temperature (F): 97.9 Height (in): 70 Pulse (bpm): 84 Weight (lbs): 341 Respiratory Rate (breaths/min): 17 Body Mass Index (BMI): 48.9 Blood Pressure (mmHg): 124/74 Reference Range: 80 - 120 mg / dl Electronic Signature(s) Signed: 11/23/2020 5:30:00 PM By: Rhae Hammock RN Entered By: Rhae Hammock on 11/23/2020 12:39:32

## 2020-11-29 DIAGNOSIS — Z0271 Encounter for disability determination: Secondary | ICD-10-CM

## 2020-11-30 ENCOUNTER — Other Ambulatory Visit: Payer: Self-pay

## 2020-11-30 ENCOUNTER — Encounter: Payer: Self-pay | Admitting: Internal Medicine

## 2020-11-30 ENCOUNTER — Ambulatory Visit (INDEPENDENT_AMBULATORY_CARE_PROVIDER_SITE_OTHER): Payer: BC Managed Care – PPO | Admitting: Internal Medicine

## 2020-11-30 ENCOUNTER — Encounter: Payer: Self-pay | Admitting: Pulmonary Disease

## 2020-11-30 DIAGNOSIS — M4628 Osteomyelitis of vertebra, sacral and sacrococcygeal region: Secondary | ICD-10-CM | POA: Diagnosis not present

## 2020-11-30 NOTE — Patient Instructions (Signed)
Thank you for coming to see me today. It was a pleasure seeing you.  To Do: Marland Kitchen Picc line removal today and stop antibiotics . Continue to follow up with wound care . See me in about 4 weeks  If you have any questions or concerns, please do not hesitate to call the office at [phone].  Take Care,   Gwynn Burly, DO

## 2020-11-30 NOTE — Progress Notes (Signed)
Per verbal order from Dr. Ronnald Ramp, 36cm PICC removed from right basilic, tip intact. No sutures present. RN confirmed length per chart. Dressing was clean and dry. Petroleum dressing applied. Patient advised no heavy lifting with this arm and to leave dressing in place for 24 hours and not to shower affected arm for 24 hours. Advised patient to call office or seek emergency medical care if dressing becomes soaked with blood, swelling, or sharp pain presents. Advised patient to seek emergent care if develops neurological symptoms, chest pain, or shortness of breath. Patient verbalized understanding and agreement. RN answered patient's questions. Patient tolerated procedure well. RN notified Debbie at [facility] of removal.   Sandie Ano, RN

## 2020-11-30 NOTE — Progress Notes (Signed)
[name]  CHIEF COMPLAINT:    Follow up for sacral OM  SUBJECTIVE:    Collin Brown is a 35 y.o. male with PMHx as below who presents to the clinic for sacral OM.    Please see A&P for the details of today's visit and status of the patient's medical problems.   Patient's Medications  New Prescriptions   No medications on file  Previous Medications   ASCORBIC ACID (VITAMIN C) 500 MG TABLET    Take 1 tablet (500 mg total) by mouth daily.   ASPIRIN 81 MG CHEWABLE TABLET    Chew 1 tablet (81 mg total) by mouth daily.   BLOOD GLUCOSE MONITORING SUPPL (ACCU-CHEK GUIDE ME) W/DEVICE KIT    USE TO CHECK BLOOD SUGAR UP TO 4 TIMES DAILY BEFORE MEALS AS DIRECTED   FLUTICASONE-SALMETEROL (ADVAIR DISKUS) 250-50 MCG/DOSE AEPB    Inhale 1 puff into the lungs 2 (two) times daily.   GABAPENTIN (NEURONTIN) 800 MG TABLET    Take 1 tablet (800 mg total) by mouth 4 (four) times daily as needed.   INSULIN LISPRO (HUMALOG KWIKPEN) 100 UNIT/ML KWIKPEN    Inject 4 Units into the skin 3 (three) times daily. Take with meals (as long as blood sugars are over 80)   INSULIN PEN NEEDLE (CAREFINE PEN NEEDLES) 32G X 6 MM MISC    1 applicator by Does not apply route 4 (four) times daily -  with meals and at bedtime.   LANTUS SOLOSTAR 100 UNIT/ML SOLOSTAR PEN    Inject into the skin.   METOPROLOL TARTRATE (LOPRESSOR) 50 MG TABLET    Take 1.5 tablets (75 mg total) by mouth 2 (two) times daily.   MULTIPLE VITAMIN (MULTIVITAMIN WITH MINERALS) TABS TABLET    Take 1 tablet by mouth daily.   ONDANSETRON (ZOFRAN) 4 MG TABLET    Take 1 tablet (4 mg total) by mouth every 8 (eight) hours as needed for nausea or vomiting.   OXYCODONE-ACETAMINOPHEN (PERCOCET) 10-325 MG TABLET    Take 1 tablet by mouth every 6 (six) hours as needed for pain.   POTASSIUM CHLORIDE SA (KLOR-CON) 20 MEQ TABLET    Take 2 tablets by mouth twice daily   SENNA-DOCUSATE (SENOKOT-S) 8.6-50 MG TABLET    Take 2 tablets  by mouth 2 (two) times daily.  Modified Medications   No medications on file  Discontinued Medications   No medications on file      Past Medical History:  Diagnosis Date  . Acute blood loss anemia   . Critical illness myopathy   . Diabetes mellitus without complication ([place])   . Dyspnea 08/23/2020  . Hearing loss in right ear   . Hypertension   . Sleep apnea     Social History   Tobacco Use  . Smoking status: Never Smoker  . Smokeless tobacco: Never Used  Vaping Use  . Vaping Use: Never used  Substance Use Topics  . Alcohol use: No  . Drug use: No    Family History  Problem Relation Age of Onset  . Diabetes Mellitus II Mother   . Hypertension Mother   . Diabetes Mother   . Diabetes Mellitus II Father   . High blood pressure Father   . Heart attack Father     No Known Allergies  Review of Systems  Constitutional: Negative.   Gastrointestinal:       Occasional loose stool  Genitourinary: Negative.  Musculoskeletal: Positive for back pain (improved).  All other systems reviewed and are negative.    OBJECTIVE:    Vitals:   11/30/20 1042  BP: (!) 176/122  Pulse: 90  Temp: 98 F (36.7 C)  TempSrc: Oral  SpO2: 95%  Weight: (!) 380 lb (172.4 kg)  Height: '5\' 10"'  (1.778 m)   Body mass index is 54.52 kg/m.  Physical Exam Constitutional:      General: He is not in acute distress.    Appearance: Normal appearance.  Pulmonary:     Effort: Pulmonary effort is normal. No respiratory distress.  Skin:    Comments: Right UE PICC  Neurological:     General: No focal deficit present.     Mental Status: He is alert and oriented to person, place, and time.  Psychiatric:        Mood and Affect: Mood normal.        Behavior: Behavior normal.      Labs and Microbiology: CBC Latest Ref Rng & Units 10/15/2020 08/23/2020 07/09/2020  WBC 3.8 - 10.8 Thousand/uL 7.7 7.9 7.5  Hemoglobin 13.2 - 17.1 g/dL 14.4 13.4 11.5(L)  Hematocrit 38.5 - 50.0 % 43.2 41.5  37.0(L)  Platelets 140 - 400 Thousand/uL 278 294 339   CMP Latest Ref Rng & Units 10/15/2020 08/23/2020 07/06/2020  Glucose 65 - 99 mg/dL 244(H) 108(H) 104(H)  BUN 7 - 25 mg/dL '15 11 9  ' Creatinine 0.60 - 1.35 mg/dL 0.73 0.57(L) 0.56(L)  Sodium 135 - 146 mmol/L 137 139 137  Potassium 3.5 - 5.3 mmol/L 4.4 4.7 4.0  Chloride 98 - 110 mmol/L 102 102 99  CO2 20 - 32 mmol/L '25 22 25  ' Calcium 8.6 - 10.3 mg/dL 10.2 10.0 9.6  Total Protein 6.1 - 8.1 g/dL 6.5 6.3 -  Total Bilirubin 0.2 - 1.2 mg/dL 0.3 0.3 -  Alkaline Phos 44 - 121 IU/L - 68 -  AST 10 - 40 U/L 17 17 -  ALT 9 - 46 U/L 30 26 -      ASSESSMENT & PLAN:    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  He has been on CTX, dapto, flagyl now for 6 weeks today and tolerating well.  Continues to follow up with wound care.  Labs last visit showed stable, mildly elevated ESR/CRP.  Discussed this is non-specific and can be elevated for a number of reasons in the setting of an open wound and ongoing wound care.  Continues to progress with his wound and being more active.  Hopefully we have provided enough antibiotics to have cured his infection.  He has no evidence at this time of treatment failure.  Will have PICC removed and stop abx today.  He will need continued wound care, off loading as able, keeping the area clean and not contaminated with stool, and glycemic control.  Will have him follow up again in a few weeks to ensure he is doing well.     Raynelle [facility] 11/30/2020, 11:07 AM

## 2020-11-30 NOTE — Assessment & Plan Note (Signed)
Pt with longstanding sacral decub ulcer after prolonged admit for Covid Fall 2021.  He has been on CTX, dapto, flagyl now for 6 weeks today and tolerating well.  Continues to follow up with wound care.  Labs last visit showed stable, mildly elevated ESR/CRP.  Discussed this is non-specific and can be elevated for a number of reasons in the setting of an open wound and ongoing wound care.  Continues to progress with his wound and being more active.  Hopefully we have provided enough antibiotics to have cured his infection.  He has no evidence at this time of treatment failure.  Will have PICC removed and stop abx today.  He will need continued wound care, off loading as able, keeping the area clean and not contaminated with stool, and glycemic control.  Will have him follow up again in a few weeks to ensure he is doing well.

## 2020-12-04 ENCOUNTER — Other Ambulatory Visit: Payer: Self-pay | Admitting: Neurosurgery

## 2020-12-04 ENCOUNTER — Encounter: Payer: Self-pay | Admitting: Physical Medicine and Rehabilitation

## 2020-12-04 ENCOUNTER — Encounter
Payer: BC Managed Care – PPO | Attending: Physical Medicine and Rehabilitation | Admitting: Physical Medicine and Rehabilitation

## 2020-12-04 ENCOUNTER — Other Ambulatory Visit: Payer: Self-pay

## 2020-12-04 VITALS — BP 138/83 | HR 94 | Temp 98.2°F | Ht 70.0 in | Wt 383.4 lb

## 2020-12-04 DIAGNOSIS — U099 Post covid-19 condition, unspecified: Secondary | ICD-10-CM

## 2020-12-04 DIAGNOSIS — L89154 Pressure ulcer of sacral region, stage 4: Secondary | ICD-10-CM

## 2020-12-04 DIAGNOSIS — G7281 Critical illness myopathy: Secondary | ICD-10-CM | POA: Diagnosis present

## 2020-12-04 DIAGNOSIS — Z7409 Other reduced mobility: Secondary | ICD-10-CM | POA: Insufficient documentation

## 2020-12-04 DIAGNOSIS — R5381 Other malaise: Secondary | ICD-10-CM | POA: Diagnosis present

## 2020-12-04 DIAGNOSIS — S143XXA Injury of brachial plexus, initial encounter: Secondary | ICD-10-CM

## 2020-12-04 MED ORDER — GABAPENTIN 800 MG PO TABS: 800.0000 mg | ORAL_TABLET | Freq: Four times a day (QID) | ORAL | 1 refills | Status: DC | PRN

## 2020-12-04 MED ORDER — OXYCODONE HCL 5 MG PO TABS: 5.0000 mg | ORAL_TABLET | Freq: Four times a day (QID) | ORAL | 0 refills | Status: DC | PRN

## 2020-12-04 NOTE — Progress Notes (Addendum)
Subjective:    Patient ID: Collin Brown, male    DOB: [birth year], 35 y.o.   MRN: [id number]  HPI: Collin Brown is a 35 y.o. 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 Collin Brown presented to [facility] on 05/03/2020 with SOB and generalized weakness and fever for a few days. Patient presented to ED after being positive for COVID-19, he reported he thinks he was exposed to his Coworker who was COVID positive, however his mother had tested positive as well per ED note from Dr Allena Katz.  He was started on COVID protocol with remdesivir.   Collin Brown had a complicated hospital stay, see discharge summaries for details.   He has returned to work! His boss is understanding and allows rest breaks. His first day was exhausting.   His hair has grown back really well!  Pain medication has really been helping. Pain is currently worst in arm.   He is taking oxycodone every 6 hours.   Wound doctor does not want him sitting for several hours straight on his wound. He has been trying to find ways to take pressure off the wound.   He was found to have a brachial plexopathy on EMG. It was very painful for him. He has an MRI to evaluate the brachial plexus. Surgical treatment options have been discussed with him.  PICC line has been removed!  Neurology was consulted for left upper extremity weakness, he received steroids per Discharge summary.   DG Chest:  IMPRESSION: Diffuse bilateral ground-glass airspace opacities consistent with the patient's history of viral pneumonia  Collin Brown was admitted to inpatient rehabilitation on 06/08/2020.   Collin Brown was noted  with sacral wound stage 4, wound care was following. He has a scheduled appointment with wound care.   Collin Brown was  discharged on 07/13/2020, he is receiving Home Health therapy from [facility]. He states his pain is located in his  lower back and sacral wound. He rates his pain 5. He reports he is walking with walker in his home to transfer from chair to wheelchair.   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 Pain is on average 7/10, pain right now is 5/10. His sensation has returned in his arm with the exception of his pinky. He has the sweetest OT. He has been trying to switch between Tylenol and Ibuprofen. He continues on Gabapentin. He has been taking 1200mg  threes times per day. The pain feels sharp, stabbing, and tingling. Collin Brown Morphine equivalent is 60.00 MME.  Oral Swab was Performed previously. Pain contract signed previously.  -His pain is much better controlled but he does not feel ready to wean off Oxycodone yet. He thinks he will be able to next visit. He would like to get completely off of it. He does not like to be on medication in general.  -His pain is worse in his left arm and sacral pressure injury. -The neuropathic pain in his left arm has improved and the Gabapentin does not make him sleepy.  -He hopes to be able to return to work soon and his been asking if there are alternative positions he can take at his prior job.  2) Sacral pressure injury: He has been following with wound care. He is seeing orthopedics  on Wednesday. They ar every pleased with nursing and physician wound care.  -Unfortunately he developed a tailbone infection since last visit and may need to start on IV antibiotics.  -This pain can also be severe at times.    Collin Brown mother in room and all questions answered.   Pain Inventory Average Pain 3 Pain Right Now 2 My pain is sharp, burning and stabbing  In the last 24 hours, has pain interfered with the following? General activity 5 Relation with others 3 Enjoyment of life 4 What TIME of day is your  pain at its worst? evening Sleep (in general) Fair  Pain is worse with: bending, sitting and inactivity Pain improves with: rest and medication Relief from Meds: 10          Family History  Problem Relation Age of Onset  . Diabetes Mellitus II Mother   . Hypertension Mother   . Diabetes Mother   . Diabetes Mellitus II Father   . High blood pressure Father   . Heart attack Father    Social History   Socioeconomic History  . Marital status: Single    Spouse name: Not on file  . Number of children: Not on file  . Years of education: Not on file  . Highest education level: Not on file  Occupational History  . Occupation: Risk manager  Tobacco Use  . Smoking status: Never Smoker  . Smokeless tobacco: Never Used  Vaping Use  . Vaping Use: Never used  Substance and Sexual Activity  . Alcohol use: No  . Drug use: No  . Sexual activity: Yes    Birth control/protection: None  Other Topics Concern  . Not on file  Social History Narrative  . Not on file   Social Determinants of Health   Financial Resource Strain: Not on file  Food Insecurity: Not on file  Transportation Needs: Not on file  Physical Activity: Not on file  Stress: Not on file  Social Connections: Not on file   Past Surgical History:  Procedure Laterality Date  . TONSILLECTOMY    . WOUND DEBRIDEMENT N/A 06/29/2020   Procedure: DEBRIDEMENT SACRAL WOUND;  Surgeon: Axel Filler, MD;  Location: [facility] OR;  Service: General;  Laterality: N/A;   Past Medical History:  Diagnosis Date  . Acute blood loss anemia   . Critical illness myopathy   . Diabetes mellitus without complication (HCC)   . Dyspnea 08/23/2020  . Hearing loss in right ear   . Hypertension   . Sleep apnea    BP 138/83   Pulse 94   Temp 98.2 F (36.8 C)   Ht 5\' 10"  (1.778 m)   Wt (!) 383 lb 6.4 oz (173.9 kg)   SpO2 95%   BMI 55.01 kg/m   Opioid Risk Score:   Fall Risk Score:  `1  Depression screen PHQ 2/9  Depression  screen [facility] 2/9 11/30/2020 11/20/2020 11/08/2020 10/15/2020 09/07/2020 08/17/2020 07/19/2020  Decreased Interest 2 2 1  0 0 1 2  Down, Depressed, Hopeless 1 1 1  0 0 1 0  PHQ - 2 Score 3 3 2  0 0 2 2  Altered sleeping 0 0 1 - - 2 3  Tired, decreased energy 2 2 - - - 2 2  Change in appetite 3 3 - - - 0 1  Feeling bad or failure about yourself  2 2 - - - 1 1  Trouble concentrating 0 0 - - - 0 0  Moving slowly  or fidgety/restless 1 1 - - - 0 0  Suicidal thoughts 0 0 - - - 0 0  PHQ-9 Score 11 11 3  - - 7 9  Difficult doing work/chores Somewhat difficult Somewhat difficult - - - Extremely dIfficult Very difficult    Review of Systems  Constitutional: Negative.   HENT: Negative.   Eyes: Negative.   Respiratory: Negative.   Cardiovascular: Negative.   Gastrointestinal: Negative.   Endocrine: Negative.   Genitourinary: Negative.   Musculoskeletal: Positive for myalgias.  Skin: Positive for wound.  Allergic/Immunologic: Negative.   Neurological: Negative.   Hematological: Negative.   Psychiatric/Behavioral: Negative.   All other systems reviewed and are negative.      Objective:    Physical Exam Gen: no distress, normal appearing HEENT: oral mucosa pink and moist, NCAT Cardio: Reg rate Chest: normal effort, normal rate of breathing Abd: soft, non-distended Ext: no edema Psych: pleasant, normal affect Musculoskeletal:     Cervical back: Normal range of motion and neck supple.     Comments: Normal Muscle Bulk and Muscle Testing Reveals:  Upper Extremities: Right: Full ROM and Muscle Strength 5/5 Left Upper Extremity: Decreased ROM and Muscle Strength 0/5 in shoulder abduction, 2/5 EF and EE, 0/5 WE and hand grip. Tremor in left hand.  Lumbar Hypersensitivity Lower Extremities: Decreased ROM and Muscle Strength 4/5 Able to walk without device.  Skin:    General: Skin is warm and dry.     Comments: Sacral Dressing Intact, wound vac in place.  Neurological:     Mental Status: He is alert  and oriented to person, place, and time.  Psychiatric:        Mood and Affect: Mood normal.        Behavior: Behavior normal.     Assessment & Plan:  1.Critical Illness Myopathy: Continue PT and OT 2. 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 Continue to Monitor.  3. Essential Hypertension: Continue current medication regimen. Continue to monitor. He has a scheduled appointment with PCP.  4. Diabetes Mellitus New Onset.: Conitnue current medication regimen and he has a scheduled appointment with PCP.  5. Sacral Wound: Continue [facility]: Wound Center. Healing. Continue oxycodone for pain control with plan to wean next visit with NP.  Cleared to shower. He is taking oxycodone every 6 hours. Decrease to 5mg .  6. Left sided brachial plexus neuropathy: CT and MRI ordered and reviewed. Riley Lam negative for blood clots. Continue follow-up with orthopedics and NSGY. Continues to be very swollen.  -Refer to pain center for sympathetic nerve block for CRPS.  -Refilled Gabapentin 800mg  up to 4 times per day. Advised to take an additional 600mg  tablet at night for pain/sleep. This puts him at may 3600mg . -Discussed following foods that may reduce pain: 1) Ginger (especially studied for arthritis)- reduce leukotriene production to decrease inflammation 2) Blueberries- high in phytonutrients that decrease inflammation 3) Salmon- marine omega-3s reduce joint swelling and pain 4) Pumpkin seeds- reduce inflammation 5) dark chocolate- reduces inflammation 6) turmeric- reduces inflammation 7) tart cherries - reduce pain and stiffness 8) extra virgin olive oil - its compound olecanthal helps to block prostaglandins  9) chili peppers- can be eaten or applied topically via capsaicin 10) mint- helpful for headache, muscle aches, joint pain, and itching 11) garlic- reduces inflammation  Link to further information on diet for chronic pain:    Link to further information on diet for chronic pain: [place]  Left  humerus XR ordered and there is no evidence of fracture. MRI has been ordered.   We will continue the opioid monitoring program, this consists of regular clinic visits, examinations, urine drug screen, pill counts as well as use of [place] Cont[place] Substance Reporting system. A 12 month History has been reviewed on the [place] Controlled Substance Reporting System on 07/19/2020.   7. Nausea: Refilled Zofran.   8) Discussed and encouraged return to work in less physical duties.   F/U in 4 weeks with Riley LamEunice, 4 weeks with Riley LamEunice, and then weeks with me   40minutes spent in discussion pf brachial plexopathy, EM/NCS, NSGY follow-up, oxycodone wean, restarting work.

## 2020-12-04 NOTE — Progress Notes (Signed)
Please review for upcoming visit since this provider no longer at [facility]. Thank you.

## 2020-12-04 NOTE — Addendum Note (Signed)
Addended by: Horton Chin on: 12/04/2020 02:00 PM   Modules accepted: Level of Service

## 2020-12-06 ENCOUNTER — Ambulatory Visit: Payer: BC Managed Care – PPO

## 2020-12-07 ENCOUNTER — Encounter (HOSPITAL_BASED_OUTPATIENT_CLINIC_OR_DEPARTMENT_OTHER): Payer: BC Managed Care – PPO | Admitting: Internal Medicine

## 2020-12-07 ENCOUNTER — Telehealth: Payer: Self-pay

## 2020-12-07 ENCOUNTER — Other Ambulatory Visit: Payer: Self-pay

## 2020-12-07 DIAGNOSIS — M8668 Other chronic osteomyelitis, other site: Secondary | ICD-10-CM | POA: Diagnosis not present

## 2020-12-07 DIAGNOSIS — L89154 Pressure ulcer of sacral region, stage 4: Secondary | ICD-10-CM | POA: Diagnosis not present

## 2020-12-07 NOTE — Telephone Encounter (Signed)
Tiffany with [facility] called: Mr. Fillinger discharge will be extended to next week. Because of [facility]'s staffing. Last week visit did not take place.  He will be discharged next week.   Thank you,
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
## 2020-12-10 ENCOUNTER — Ambulatory Visit: Payer: Self-pay | Admitting: Adult Health

## 2020-12-10 NOTE — Progress Notes (Signed)
Collin Brown ([id number]) Visit Report for 12/07/2020 Arrival Information Details Patient Name: Date of Service: Collin Brown, [place] M. 12/07/2020 12:45 PM Medical Record Number: [id number] Patient Account Number: [id number] Date of Birth/Sex: Treating RN: 1986-07-02 (35 y.o. Collin Brown, Collin Brown Primary Care Rhemi Balbach: Laverna Peace Other Clinician: Referring Delcia Spitzley: Treating Kollin Udell/Extender: Fleet Contras, [name] Weeks in Treatment: 16 Visit Information History Since Last Visit Added or deleted any medications: No Patient Arrived: Ambulatory Any new allergies or adverse reactions: No Arrival Time: 13:00 Had a fall or experienced change in No Accompanied By: mother activities of daily living that may affect Transfer Assistance: None risk of falls: Patient Identification Verified: Yes Signs or symptoms of abuse/neglect since last visito No Secondary Verification Process Completed: Yes Hospitalized since last visit: No Patient Requires Transmission-Based Precautions: No Implantable device outside of the clinic excluding No Patient Has Alerts: No cellular tissue based products placed in the center since last visit: Has Dressing in Place as Prescribed: Yes Brown Present Now: Yes Electronic Signature(s) Signed: 12/07/2020 5:21:08 PM By: Baruch Gouty RN, BSN Entered By: Baruch Gouty on 12/07/2020 13:04:20 -------------------------------------------------------------------------------- Clinic Level of Care Assessment Details Patient Name: Date of Service: Collin Brown, [place] [name] 12/07/2020 12:45 PM Medical Record Number: [id number] Patient Account Number: [id number] Date of Birth/Sex: Treating RN: September 15, 1985 (35 y.o. Collin Brown Primary Care Kanyia Heaslip: Laverna Peace Other Clinician: Referring Latoyia Tecson: Treating Dakwan Pridgen/Extender: Fleet Contras, [name] Weeks in Treatment: 16 Clinic Level of Care Assessment Items TOOL 4 Quantity  Score X- 1 0 Use when only an EandM is performed on FOLLOW-UP visit ASSESSMENTS - Nursing Assessment / Reassessment X- 1 10 Reassessment of Co-morbidities (includes updates in patient status) X- 1 5 Reassessment of Adherence to Treatment Plan ASSESSMENTS - Wound and Skin A ssessment / Reassessment X - Simple Wound Assessment / Reassessment - one wound 1 5 _0  - 0 Complex Wound Assessment / Reassessment - multiple wounds _1  - 0 Dermatologic / Skin Assessment (not related to wound area) ASSESSMENTS - Focused Assessment _2  - 0 Circumferential Edema Measurements - multi extremities _3  - 0 Nutritional Assessment / Counseling / Intervention _4  - 0 Lower Extremity Assessment (monofilament, tuning fork, pulses) _5  - 0 Peripheral Arterial Disease Assessment (using hand held doppler) ASSESSMENTS - Ostomy and/or Continence Assessment and Care _6  - 0 Incontinence Assessment and Management _7  - 0 Ostomy Care Assessment and Management (repouching, etc.) PROCESS - Coordination of Care _8  - 0 Simple Patient / Family Education for ongoing care X- 1 20 Complex (extensive) Patient / Family Education for ongoing care X- 1 10 Staff obtains Programmer, systems, Records, T Results / Process Orders est X- 1 10 Staff telephones HHA, Nursing Homes / Clarify orders / etc _9  - 0 Routine Transfer to another Facility (non-emergent condition) _10  - 0 Routine Hospital Admission (non-emergent condition) _11  - 0 New Admissions / Biomedical engineer / Ordering NPWT Apligraf, etc. , _12  - 0 Emergency Hospital Admission (emergent condition) _13  - 0 Simple Discharge Coordination _14  - 0 Complex (extensive) Discharge Coordination PROCESS - Special Needs _15  - 0 Pediatric / Minor Patient Management _16  - 0 Isolation Patient Management _17  - 0 Hearing / Language / Visual special needs _18  - 0 Assessment of Community assistance (transportation, D/C planning, etc.) _19  - 0 Additional assistance / Altered  mentation _20  - 0 Support Surface(s) Assessment (bed, cushion, seat, etc.) INTERVENTIONS - Wound Cleansing / Measurement X - Simple Wound Cleansing - one wound 1 5 _21  - 0 Complex Wound Cleansing - multiple  wounds X- 1 5 Wound Imaging (photographs - any number of wounds) _0  - 0 Wound Tracing (instead of photographs) X- 1 5 Simple Wound Measurement - one wound _1  - 0 Complex Wound Measurement - multiple wounds INTERVENTIONS - Wound Dressings X - Small Wound Dressing one or multiple wounds 1 10 _2  - 0 Medium Wound Dressing one or multiple wounds _3  - 0 Large Wound Dressing one or multiple wounds <SWNIOEVOJJKKXFGH>_8<\/EXHBZJIRCVELFYBO>_1  - 0 Application of Medications - topical <BPZWCHENIDPOEUMP>_5<\/TIRWERXVQMGQQPYP>_9  - 0 Application of Medications - injection INTERVENTIONS - Miscellaneous _6  - 0 External ear exam _7  - 0 Specimen Collection (cultures, biopsies, blood, body fluids, etc.) _8  - 0 Specimen(s) / Culture(s) sent or taken to Lab for analysis _9  - 0 Patient Transfer (multiple staff / Civil Service fast streamer / Similar devices) _10  - 0 Simple Staple / Suture removal (25 or less) _11  - 0 Complex Staple / Suture removal (26 or more) _12  - 0 Hypo / Hyperglycemic Management (close monitor of Blood Glucose) _13  - 0 Ankle / Brachial Index (ABI) - do not check if billed separately X- 1 5 Vital Signs Has the patient been seen at the hospital within the last three years: Yes Total Score: 90 Level Of Care: New/Established - Level 3 Electronic Signature(s) Signed: 12/07/2020 5:13:52 PM By: Lorrin Jackson Entered By: Lorrin Jackson on 12/07/2020 14:14:10 -------------------------------------------------------------------------------- Encounter Discharge Information Details Patient Name: Date of Service: Collin Pain, [name] 12/07/2020 12:45 PM Medical Record Number: [id number] Patient Account Number: [id number] Date of Birth/Sex: Treating RN: 09-26-1985 (35 y.o. [name] Primary Care Kayana Thoen: Laverna Peace Other Clinician: Referring Adama Ferber: Treating  Barbee Mamula/Extender: Fleet Contras, [name] Weeks in Treatment: 63 Encounter Discharge Information Items Discharge Condition: Stable Ambulatory Status: Ambulatory Discharge Destination: Home Transportation: Private Auto Accompanied By: wife Schedule Follow-up Appointment: Yes Clinical Summary of Care: Electronic Signature(s) Signed: 12/07/2020 5:30:47 PM By: Deon Pilling Entered By: Deon Pilling on 12/07/2020 17:21:22 -------------------------------------------------------------------------------- Lower Extremity Assessment Details Patient Name: Date of Service: Collin Brown, [place] [name] 12/07/2020 12:45 PM Medical Record Number: [id number] Patient Account Number: [id number] Date of Birth/Sex: Treating RN: Dec 24, 1985 (35 y.o. [name] Primary Care Julanne Schlueter: Laverna Peace Other Clinician: Referring Cadarius Nevares: Treating Estaban [place]ville/Extender: Fleet Contras, [name] Weeks in Treatment: 16 Electronic Signature(s) Signed: 12/07/2020 5:21:08 PM By: Baruch Gouty RN, BSN Entered By: Baruch Gouty on 12/07/2020 13:16:58 -------------------------------------------------------------------------------- Multi Wound Chart Details Patient Name: Date of Service: Collin Pain, [name] 12/07/2020 12:45 PM Medical Record Number: [id number] Patient Account Number: [id number] Date of Birth/Sex: Treating RN: Jun 28, 1986 (35 y.o. Collin Brown Primary Care Suzanna Zahn: Laverna Peace Other Clinician: Referring Platon Arocho: Treating Melvern Ramone/Extender: Fleet Contras, [name] Weeks in Treatment: 16 Vital Signs Height(in): 70 Capillary Blood Glucose(mg/dl): 133 Weight(lbs): 341 Pulse(bpm): 86 Body Mass Index(BMI): 68 Blood Pressure(mmHg): 135/83 Temperature(F): 98.2 Respiratory Rate(breaths/min): 18 Photos: [1:No Photos Sacrum] [N/A:N/A N/A] Wound Location: [1:Pressure Injury] [N/A:N/A] Wounding Event: [1:Pressure Ulcer] [N/A:N/A] Primary Etiology:  [1:Anemia, Sleep Apnea, Hypertension, N/A] Comorbid History: [1:Type II Diabetes 05/18/2020] [N/A:N/A] Date Acquired: [1:16] [N/A:N/A] Weeks of Treatment: [1:Open] [N/A:N/A] Wound Status: [1:0.4x0.3x4.3] [N/A:N/A] Measurements L x W x D (cm) [1:0.094] [N/A:N/A] A (cm) : rea [1:0.405] [N/A:N/A] Volume (cm) : [1:93.40%] [N/A:N/A] % Reduction in A rea: [1:93.70%] [N/A:N/A] % Reduction in Volume: [1:Category/Stage IV] [N/A:N/A] Classification: [1:Medium] [N/A:N/A] Exudate A mount: [1:Serosanguineous] [N/A:N/A] Exudate Type: [1:red, brown] [N/A:N/A] Exudate Color: [1:Distinct, outline attached] [N/A:N/A] Wound Margin: [1:Large (67-100%)] [N/A:N/A] Granulation A mount: [1:Red] [N/A:N/A] Granulation Quality: [1:None Present (0%)] [N/A:N/A] Necrotic A mount: [1:Fat  Layer (Subcutaneous Tissue): Yes N/A] Exposed Structures: [1:Fascia: No Tendon: No Muscle: No Joint: No Bone: No Small (1-33%)] [N/A:N/A] Treatment Notes Electronic Signature(s) Signed: 12/07/2020 3:14:07 PM By: Lorrin Jackson Previous Signature: 12/07/2020 2:47:19 PM Version By: Kalman Shan DO Entered By: Lorrin Jackson on 12/07/2020 15:14:07 -------------------------------------------------------------------------------- Multi-Disciplinary Care Plan Details Patient Name: Date of Service: Collin Pain, [name] 12/07/2020 12:45 PM Medical Record Number: [id number] Patient Account Number: [id number] Date of Birth/Sex: Treating RN: 20-Dec-1985 (35 y.o. Collin Brown Primary Care Dava Rensch: Laverna Peace Other Clinician: Referring Nakhia Levitan: Treating Samiya Mervin/Extender: Fleet Contras, [name] Weeks in Treatment: 16 Multidisciplinary Care Plan reviewed with physician Active Inactive Nutrition Nursing Diagnoses: Potential for alteratiion in Nutrition/Potential for imbalanced nutrition Goals: Patient/caregiver agrees to and verbalizes understanding of need to obtain nutritional consultation Date  Initiated: 08/16/2020 Date Inactivated: 09/14/2020 Target Resolution Date: 09/14/2020 Goal Status: Met Patient/caregiver verbalizes understanding of need to maintain therapeutic glucose control per primary care physician Date Initiated: 08/16/2020 Target Resolution Date: 12/28/2020 Goal Status: Active Interventions: Assess HgA1c results as ordered upon admission and as needed Provide education on elevated blood sugars and impact on wound healing Provide education on nutrition Treatment Activities: Obtain HgA1c : 08/16/2020 Patient referred to Primary Care Physician for further nutritional evaluation : 08/16/2020 Notes: 12/07/20: Glucose control ongoing, target date extended. Pressure Nursing Diagnoses: Knowledge deficit related to management of pressures ulcers Potential for impaired tissue integrity related to pressure, friction, moisture, and shear Goals: Patient will remain free from development of additional pressure ulcers Date Initiated: 08/16/2020 Date Inactivated: 09/14/2020 Target Resolution Date: 09/14/2020 Goal Status: Met Patient/caregiver will verbalize understanding of pressure ulcer management Date Initiated: 08/16/2020 Target Resolution Date: 12/28/2020 Goal Status: Active Interventions: Assess: immobility, friction, shearing, incontinence upon admission and as needed Assess potential for pressure ulcer upon admission and as needed Provide education on pressure ulcers Treatment Activities: Consult for HBO : 08/16/2020 Pressure reduction/relief device ordered : 08/16/2020 T ordered outside of clinic : 08/16/2020 est Notes: Wound/Skin Impairment Nursing Diagnoses: Knowledge deficit related to ulceration/compromised skin integrity Goals: Patient/caregiver will verbalize understanding of skin care regimen Date Initiated: 08/16/2020 Target Resolution Date: 12/07/2020 Goal Status: Active Interventions: Assess patient/caregiver ability to perform ulcer/skin care regimen upon admission and  as needed Assess ulceration(s) every visit Provide education on ulcer and skin care Treatment Activities: Skin care regimen initiated : 08/16/2020 Topical wound management initiated : 08/16/2020 Notes: Electronic Signature(s) Signed: 12/07/2020 1:27:19 PM By: Lorrin Jackson Entered By: Lorrin Jackson on 12/07/2020 13:27:19 -------------------------------------------------------------------------------- Brown Assessment Details Patient Name: Date of Service: Collin Pain, [name] 12/07/2020 12:45 PM Medical Record Number: [id number] Patient Account Number: [id number] Date of Birth/Sex: Treating RN: 10/08/1985 (35 y.o. [name] Primary Care Arliss Hepburn: Laverna Peace Other Clinician: Referring Layten Aiken: Treating Johannah Rozas/Extender: Fleet Contras, [name] Weeks in Treatment: 70 Active Problems Location of Brown Severity and Description of Brown Patient Has Paino Yes Site Locations Brown Location: Brown in Ulcers With Dressing Change: Yes Duration of the Brown. Constant / Intermittento Intermittent Rate the Brown. Current Brown Level: 1 Worst Brown Level: 5 Least Brown Level: 0 Character of Brown Describe the Brown: Aching Brown Management and Medication Current Brown Management: Medication: Yes Is the Current Brown Management Adequate: Adequate How does your wound impact your activities of daily livingo Sleep: No Bathing: No Appetite: No Relationship With Others: No Bladder Continence: No Emotions: No Bowel Continence: No Work: No Toileting: No Drive: No Dressing: No Hobbies: No Electronic Signature(s) Signed: 12/07/2020 5:21:08 PM By: Baruch Gouty RN,  BSN Entered By: Baruch Gouty on 12/07/2020 13:16:50 -------------------------------------------------------------------------------- Patient/Caregiver Education Details Patient Name: Date of Service: Collin Brown, [place] [name] 4/29/2022andnbsp12:45 PM Medical Record Number: [id number] Patient Account Number:  [id number] Date of Birth/Gender: Treating RN: 03/29/1986 (35 y.o. Collin Brown Primary Care Physician: Laverna Peace Other Clinician: Referring Physician: Treating Physician/Extender: Fleet Contras, [name] Weeks in Treatment: 16 Education Assessment Education Provided To: Patient Education Topics Provided Elevated Blood Sugar/ Impact on Healing: Methods: Explain/Verbal, Printed Responses: State content correctly Pressure: Methods: Explain/Verbal, Printed Responses: State content correctly Wound/Skin Impairment: Methods: Explain/Verbal, Printed Responses: State content correctly Electronic Signature(s) Signed: 12/07/2020 5:13:52 PM By: Lorrin Jackson Entered By: Lorrin Jackson on 12/07/2020 13:27:44 -------------------------------------------------------------------------------- Wound Assessment Details Patient Name: Date of Service: Collin Pain, [name] 12/07/2020 12:45 PM Medical Record Number: [id number] Patient Account Number: [id number] Date of Birth/Sex: Treating RN: 1986-07-15 (35 y.o. [name] Primary Care Dhanvi Boesen: Laverna Peace Other Clinician: Referring Zymere Patlan: Treating Samy Ryner/Extender: Fleet Contras, [name] Weeks in Treatment: 16 Wound Status Wound Number: 1 Primary Etiology: Pressure Ulcer Wound Location: Sacrum Wound Status: Open Wounding Event: Pressure Injury Comorbid History: Anemia, Sleep Apnea, Hypertension, Type II Diabetes Date Acquired: 05/18/2020 Weeks Of Treatment: 16 Clustered Wound: No Photos Wound Measurements Length: (cm) 0.4 Width: (cm) 0.3 Depth: (cm) 4.3 Area: (cm) 0.094 Volume: (cm) 0.405 % Reduction in Area: 93.4% % Reduction in Volume: 93.7% Epithelialization: Small (1-33%) Tunneling: No Undermining: No Wound Description Classification: Category/Stage IV Wound Margin: Distinct, outline attached Exudate Amount: Medium Exudate Type: Serosanguineous Exudate Color: red,  brown Foul Odor After Cleansing: No Slough/Fibrino No Wound Bed Granulation Amount: Large (67-100%) Exposed Structure Granulation Quality: Red Fascia Exposed: No Necrotic Amount: None Present (0%) Fat Layer (Subcutaneous Tissue) Exposed: Yes Tendon Exposed: No Muscle Exposed: No Joint Exposed: No Bone Exposed: No Treatment Notes Wound #1 (Sacrum) Cleanser Normal Saline Discharge Instruction: Cleanse the wound with Normal Saline prior to applying a clean dressing using gauze sponges, not tissue or cotton balls. Normal Saline Discharge Instruction: Cleanse the wound with Normal Saline prior to applying a clean dressing using gauze sponges, not tissue or cotton balls. Peri-Wound Care Skin Prep Discharge Instruction: Use skin prep as directed Topical Primary Dressing KerraCel Ag Gelling Fiber Dressing, 4x5 in (silver alginate) Discharge Instruction: Apply silver alginate packed lightly into wound Secondary Dressing ComfortFoam Border, 6x6 in (silicone border) Discharge Instruction: Apply over primary dressing as directed. Secured With Compression Wrap Compression Stockings Environmental education officer) Signed: 12/07/2020 4:35:52 PM By: Sandre Kitty Signed: 12/07/2020 5:21:08 PM By: Baruch Gouty RN, BSN Previous Signature: 12/07/2020 3:13:54 PM Version By: Lorrin Jackson Entered By: Sandre Kitty on 12/07/2020 16:27:50 -------------------------------------------------------------------------------- Vitals Details Patient Name: Date of Service: Collin Pain, [name] 12/07/2020 12:45 PM Medical Record Number: [id number] Patient Account Number: [id number] Date of Birth/Sex: Treating RN: 1986/02/12 (35 y.o. [name] Primary Care Essica Kiker: Laverna Peace Other Clinician: Referring Shana Zavaleta: Treating Gerad Cornelio/Extender: Fleet Contras, [name] Weeks in Treatment: 16 Vital Signs Time Taken: 16:06 Temperature (F): 98.2 Height (in): 70 Pulse  (bpm): 86 Source: Stated Respiratory Rate (breaths/min): 18 Weight (lbs): 341 Blood Pressure (mmHg): 135/83 Source: Stated Capillary Blood Glucose (mg/dl): 133 Body Mass Index (BMI): 48.9 Reference Range: 80 - 120 mg / dl Notes glucose per pt report yesterday Electronic Signature(s) Signed: 12/07/2020 5:21:08 PM By: Baruch Gouty RN, BSN Entered By: Baruch Gouty on 12/07/2020 13:07:30

## 2020-12-11 ENCOUNTER — Encounter: Payer: Self-pay | Admitting: Family Medicine

## 2020-12-11 ENCOUNTER — Ambulatory Visit (INDEPENDENT_AMBULATORY_CARE_PROVIDER_SITE_OTHER): Payer: BC Managed Care – PPO | Admitting: Family Medicine

## 2020-12-11 ENCOUNTER — Other Ambulatory Visit: Payer: Self-pay

## 2020-12-11 VITALS — BP 133/88 | HR 90 | Temp 98.4°F | Resp 16 | Wt 381.7 lb

## 2020-12-11 DIAGNOSIS — E1169 Type 2 diabetes mellitus with other specified complication: Secondary | ICD-10-CM

## 2020-12-11 DIAGNOSIS — E662 Morbid (severe) obesity with alveolar hypoventilation: Secondary | ICD-10-CM | POA: Diagnosis not present

## 2020-12-11 DIAGNOSIS — I10 Essential (primary) hypertension: Secondary | ICD-10-CM

## 2020-12-11 DIAGNOSIS — M4628 Osteomyelitis of vertebra, sacral and sacrococcygeal region: Secondary | ICD-10-CM

## 2020-12-11 DIAGNOSIS — I890 Lymphedema, not elsewhere classified: Secondary | ICD-10-CM | POA: Diagnosis not present

## 2020-12-11 DIAGNOSIS — G540 Brachial plexus disorders: Secondary | ICD-10-CM | POA: Diagnosis not present

## 2020-12-11 NOTE — Assessment & Plan Note (Signed)
Discussed importance of healthy weight management Discussed diet and exercise  

## 2020-12-11 NOTE — Assessment & Plan Note (Signed)
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

## 2020-12-11 NOTE — Assessment & Plan Note (Signed)
Now followed by endocrinology No changes to medications today Reviewed last A1c

## 2020-12-11 NOTE — Assessment & Plan Note (Signed)
Well controlled Continue current medications Reviewed recent metabolic panel F/u in 6 months  

## 2020-12-11 NOTE — Assessment & Plan Note (Signed)
Still undergoing work-up with neurosurgery Hoping that he can avoid a large surgery for this

## 2020-12-11 NOTE — Assessment & Plan Note (Signed)
Longstanding after prolonged hospitalization for COVID in fall 2021 Has been doing home occupational therapy Would benefit from lymphedema therapy Referral placed today

## 2020-12-11 NOTE — Progress Notes (Signed)
Established patient visit   Patient: Collin Brown   DOB: 11-17-1985   35 y.o. Male  MRN: [id number] Visit Date: 12/11/2020  Today's healthcare provider: Lavon Paganini, MD   Chief Complaint  Patient presents with  . Abnormal Lab   Subjective    HPI  Patient presents in office today to discuss recent lab results (11/20/20).  Seeing pain clinic, neurosurgery, PM&R for brachial plexus injury. L arm still swollen.  Has not done lymphedema therapy. 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  Back to work. No further HH ---------------------------------------------------------------------------------------------------     Medications: Outpatient Medications Prior to Visit  Medication Sig  . ascorbic acid (VITAMIN C) 500 MG tablet Take 1 tablet (500 mg total) by mouth daily.  Marland Kitchen aspirin 81 MG chewable tablet Chew 1 tablet (81 mg total) by mouth daily.  . Blood Glucose Monitoring Suppl (ACCU-CHEK GUIDE ME) w/Device KIT USE TO CHECK BLOOD SUGAR UP TO 4 TIMES DAILY BEFORE MEALS AS DIRECTED  . gabapentin (NEURONTIN) 800 MG tablet Take 1 tablet (800 mg total) by mouth 4 (four) times daily as needed.  . Insulin Pen Needle (CAREFINE PEN NEEDLES) 32G X 6 MM MISC 1 applicator by Does not apply route 4 (four) times daily -  with meals and at bedtime.  Marland Kitchen LANTUS SOLOSTAR 100 UNIT/ML Solostar Pen Inject into the skin.  . metoprolol tartrate (LOPRESSOR) 50 MG tablet Take 1.5 tablets (75 mg total) by mouth 2 (two) times daily.  . Multiple Vitamin (MULTIVITAMIN WITH MINERALS) TABS tablet Take 1 tablet by mouth daily.  Marland Kitchen oxyCODONE (ROXICODONE) 5 MG immediate release tablet Take 1 tablet (5 mg total) by mouth every 6 (six) hours as needed for severe pain.  Marland Kitchen oxyCODONE-acetaminophen (PERCOCET) 10-325 MG tablet Take 1 tablet by mouth every 6 (six) hours as needed for pain.  . potassium chloride SA (KLOR-CON) 20 MEQ tablet Take 2 tablets by mouth twice  daily  . [DISCONTINUED] Fluticasone-Salmeterol (ADVAIR DISKUS) 250-50 MCG/DOSE AEPB Inhale 1 puff into the lungs 2 (two) times daily. (Patient not taking: No sig reported)  . [DISCONTINUED] insulin lispro (HUMALOG KWIKPEN) 100 UNIT/ML KwikPen Inject 4 Units into the skin 3 (three) times daily. Take with meals (as long as blood sugars are over 80) (Patient not taking: No sig reported)  . [DISCONTINUED] ondansetron (ZOFRAN) 4 MG tablet Take 1 tablet (4 mg total) by mouth every 8 (eight) hours as needed for nausea or vomiting. (Patient not taking: No sig reported)  . [DISCONTINUED] senna-docusate (SENOKOT-S) 8.6-50 MG tablet Take 2 tablets by mouth 2 (two) times daily. (Patient not taking: No sig reported)   No facility-administered medications prior to visit.    Review of Systems  Constitutional: Positive for activity change and fatigue. Negative for fever.  Respiratory: Positive for shortness of breath. Negative for cough and chest tightness.   Cardiovascular: Negative.   Musculoskeletal: Positive for arthralgias and myalgias.  Neurological: Positive for numbness.        Objective    BP 133/88   Pulse 90   Temp 98.4 F (36.9 C) (Oral)   Resp 16   Wt (!) 381 lb 11.2 oz (173.1 kg)   SpO2 98%   BMI 54.77 kg/m     Physical Exam Vitals reviewed.  Constitutional:      General: He is not in acute distress.    Appearance: Normal appearance. He is not diaphoretic.  HENT:  Head: Normocephalic and atraumatic.  Eyes:     General: No scleral icterus.    Conjunctiva/sclera: Conjunctivae normal.  Cardiovascular:     Rate and Rhythm: Normal rate and regular rhythm.     Pulses: Normal pulses.     Heart sounds: Normal heart sounds. No murmur heard.   Pulmonary:     Effort: Pulmonary effort is normal. No respiratory distress.     Breath sounds: Normal breath sounds. No wheezing or rhonchi.  Abdominal:     General: There is no distension.     Palpations: Abdomen is soft.      Tenderness: There is no abdominal tenderness.  Musculoskeletal:     Cervical back: Neck supple.     Right lower leg: No edema.     Left lower leg: No edema.     Comments: + edema of L arm, non-pitting  Lymphadenopathy:     Cervical: No cervical adenopathy.  Skin:    General: Skin is warm and dry.     Capillary Refill: Capillary refill takes less than 2 seconds.     Findings: No rash.  Neurological:     Mental Status: He is alert and oriented to person, place, and time.     Cranial Nerves: No cranial nerve deficit.  Psychiatric:        Mood and Affect: Mood normal.        Behavior: Behavior normal.     No results found for any visits on 12/11/20.  Assessment & Plan     Problem List Items Addressed This Visit      Cardiovascular and Mediastinum   Essential hypertension    Well controlled Continue current medications Reviewed recent metabolic panel F/u in 6 months         Respiratory   Obesity with alveolar hypoventilation and body mass index (BMI) of 40 or greater ([place])    Discussed importance of healthy weight management Discussed diet and exercise         Endocrine   Diabetes mellitus ([place])    Now followed by endocrinology No changes to medications today Reviewed last A1c        Nervous and Auditory   Brachial plexopathy    Still undergoing work-up with neurosurgery Hoping that he can avoid a large surgery for this        Musculoskeletal and Integument   Sacral osteomyelitis ([place])    Longstanding sacral decub ulcer after prolonged admission for COVID in fall 2021 He is followed by ID and continues to improve Continue to follow with wound care as well        Other   Lymphedema of arm - Primary    Longstanding after prolonged hospitalization for COVID in fall 2021 Has been doing home occupational therapy Would benefit from lymphedema therapy Referral placed today      Relevant Orders   Ambulatory referral to Occupational Therapy        Return in about 6 months (around 06/13/2021) for CPE.      Total time spent on today's visit was greater than 30 minutes, including both face-to-face time and nonface-to-face time personally spent on review of chart (labs and imaging), discussing labs and goals, discussing further work-up, treatment options, referrals to specialist if needed, reviewing outside records of pertinent, answering patient's questions, and coordinating care.    I, Lavon Paganini, MD, have reviewed all documentation for this visit. The documentation on 12/11/20 for the exam, diagnosis, procedures, and orders are all accurate and  complete.   Ellias Mcelreath, Dionne Bucy, MD, MPH [facility]

## 2020-12-20 ENCOUNTER — Other Ambulatory Visit: Payer: Self-pay

## 2020-12-20 ENCOUNTER — Encounter: Payer: BC Managed Care – PPO | Attending: Family Medicine | Admitting: Dietician

## 2020-12-20 VITALS — Ht 70.0 in | Wt 386.2 lb

## 2020-12-20 DIAGNOSIS — E119 Type 2 diabetes mellitus without complications: Secondary | ICD-10-CM

## 2020-12-20 DIAGNOSIS — Z794 Long term (current) use of insulin: Secondary | ICD-10-CM | POA: Diagnosis not present

## 2020-12-20 DIAGNOSIS — Z6841 Body Mass Index (BMI) 40.0 and over, adult: Secondary | ICD-10-CM | POA: Diagnosis not present

## 2020-12-20 NOTE — Progress Notes (Signed)
Medical Nutrition Therapy: Visit start time: 1335  end time: 1435  Assessment:  Diagnosis: obesity, Type 2 DM Past medical history: HTN Psychosocial issues/ stress concerns: none  Preferred learning method:  . Auditory . Visual . Hands-on  Current weight: 386.7lbs Height: 5'10" Medications, supplements: reconciled list in medical record  Progress and evaluation:   Patient reports weight gain over the past 6 months, since he was ill with COVID and subsequent pneumonia. Was on ventilator for 16 days. Being home from work and unable to stay active led to depression, which led to overeating.   He describes himself a workaholic, normally has worked 35-00 hour days; work involves walking around [employer] virtually throughout the shift. He is currently working 6 hour shifts with plans to increase hours gradually.  Felt best at 220lbs, when working out regularly and eating healthier. He had lost about 40lbs in the 3 months prior to contracting COVID.  Now has pain in arm due to compressed nerves, and is recovering from pressure ulcer.  He has occasionally eaten some natural dark chocolate to help with pain, but is trying to limit sugar.  Diabetes was diagnosed during his hospitalization, fall 2021.   Physical activity: weight training and cardio before becoming ill.  Significant walking on the job.   Dietary Intake:  Usual eating pattern includes 3 meals and 1-2 snacks per day. Dining out frequency: ? meals per week.  Breakfast: Vilma Meckel pancake and sausage + 1 slice cheese Snack: none Lunch: chicken/ turkey/ steak sub at [employer]; tuna salad at [employer] on [address]; was eating healthy choices meals at work Snack: likes pineapple; banana; grapes; sugar free cookies, dark chocolate; vanilla wafers (feels too often and large portions) Supper: 5/11 tacos 2-3 shells, eat, beans, sour cream, lettuce tomato; 5/10-- chicken sandwich and fries; spaghetti occ with ww pasta; chicken patty;  burgers Snack: none or same as pm; used to eat spoonful of peanut butter Beverages: water, diet sprite  Nutrition Care Education: Topics covered:  Basic nutrition: basic food groups, appropriate nutrient balance, appropriate meal and snack schedule, general nutrition guidelines  Weight control: importance of low sugar and low fat choices, portion control strategies including measuring portions, increasing low carb vegetables; estimated energy needs at 2400kcal, provided guidance for 45% CHO, 25% protein and 30% fat Advanced nutrition:  Meal prep and pre-portioning foods Diabetes: appropriate meal and snack schedule, appropriate carb intake and balance, healthy carb choices, role of fiber, protein, fat; role of physical activity Hypertension: identifying high sodium foods   Nutritional Diagnosis:  [place]-2.2 Altered nutrition-related laboratory As related to Type 2 diabetes.  As evidenced by history of elevated HbA1C and hyperglycemia. [place]-3.3 Overweight/obesity As related to excess calories and inactivity due to illness and complications.  As evidenced by patient with current BMI of 55, making diet and lifestyle changes to promote weight loss and improved BG control.  Intervention:  . Instruction and discussion as noted above. . Patient has been working on positive diet changes to improve BG control and lose weight. He is motivated to continue. . Established additional goals for change with input from patient.   Education Materials given:  . Plate Planner with food lists, sample meal pattern . Sample menus . Visit summary with goals/ instructions   Learner/ who was taught:  . Patient   Level of understanding: Marland Kitchen Verbalizes/ demonstrates competency   Demonstrated degree of understanding via:   Teach back Learning barriers: . None  Willingness to learn/ readiness for change: .  Eager, change in progress   Monitoring and Evaluation:  Dietary intake, exercise, BG control, and body  weight      follow up: 02/07/21 at 3:30pm

## 2020-12-20 NOTE — Patient Instructions (Addendum)
   Reduce food portions by: pre-portioning snack foods and put the rest of the container away. Measure portions of starchy foods like pasta, potatoes, rice. Keep these portions to 1 1/2 cups or less.   Increase low-carb veggies like salads, peppers, mushrooms, carrots -- eat them with meals and snacks, no portion limits. Keep portions of fresh fruits to 1 cup (fist size) or less.   Choose mostly grilled and baked foods rather than breaded and fried.  Pre-prep meals and snacks; planning ahead helps with following through on healthy choices.

## 2020-12-21 ENCOUNTER — Encounter (HOSPITAL_BASED_OUTPATIENT_CLINIC_OR_DEPARTMENT_OTHER): Payer: BC Managed Care – PPO | Attending: Internal Medicine | Admitting: Internal Medicine

## 2020-12-21 DIAGNOSIS — E11622 Type 2 diabetes mellitus with other skin ulcer: Secondary | ICD-10-CM | POA: Insufficient documentation

## 2020-12-21 DIAGNOSIS — Z8249 Family history of ischemic heart disease and other diseases of the circulatory system: Secondary | ICD-10-CM | POA: Insufficient documentation

## 2020-12-21 DIAGNOSIS — Z8616 Personal history of COVID-19: Secondary | ICD-10-CM | POA: Insufficient documentation

## 2020-12-21 DIAGNOSIS — I10 Essential (primary) hypertension: Secondary | ICD-10-CM | POA: Insufficient documentation

## 2020-12-21 DIAGNOSIS — Z794 Long term (current) use of insulin: Secondary | ICD-10-CM | POA: Insufficient documentation

## 2020-12-21 DIAGNOSIS — Z833 Family history of diabetes mellitus: Secondary | ICD-10-CM | POA: Insufficient documentation

## 2020-12-21 DIAGNOSIS — L89154 Pressure ulcer of sacral region, stage 4: Secondary | ICD-10-CM | POA: Insufficient documentation

## 2020-12-21 DIAGNOSIS — E1151 Type 2 diabetes mellitus with diabetic peripheral angiopathy without gangrene: Secondary | ICD-10-CM | POA: Insufficient documentation
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
## 2020-12-24 ENCOUNTER — Ambulatory Visit: Payer: BC Managed Care – PPO | Attending: Family Medicine | Admitting: Occupational Therapy

## 2020-12-24 DIAGNOSIS — R600 Localized edema: Secondary | ICD-10-CM | POA: Insufficient documentation

## 2020-12-24 DIAGNOSIS — M6281 Muscle weakness (generalized): Secondary | ICD-10-CM | POA: Insufficient documentation

## 2020-12-24 DIAGNOSIS — M25622 Stiffness of left elbow, not elsewhere classified: Secondary | ICD-10-CM | POA: Insufficient documentation

## 2020-12-24 DIAGNOSIS — M25612 Stiffness of left shoulder, not elsewhere classified: Secondary | ICD-10-CM | POA: Insufficient documentation

## 2020-12-24 DIAGNOSIS — M25632 Stiffness of left wrist, not elsewhere classified: Secondary | ICD-10-CM | POA: Insufficient documentation

## 2020-12-24 DIAGNOSIS — M25642 Stiffness of left hand, not elsewhere classified: Secondary | ICD-10-CM | POA: Insufficient documentation

## 2020-12-24 NOTE — Progress Notes (Signed)
JUANITO, GONYER ([id number]) Visit Report for 12/21/2020 Arrival Information Details Patient Name: Date of Service: Renee Pain, [place] M. 12/21/2020 12:30 PM Medical Record Number: [id number] Patient Account Number: [id number] Date of Birth/Sex: Treating RN: 30-Jul-1986 (35 y.o. [name] Primary Care Chyan Carnero: Joselyn Arrow Other Clinician: Referring Rendy Lazard: Treating Nyssa Sayegh/Extender: Hessie Dibble, [name] Weeks in Treatment: 18 Visit Information History Since Last Visit Added or deleted any medications: No Patient Arrived: Ambulatory Any new allergies or adverse reactions: No Arrival Time: 13:08 Had a fall or experienced change in No Accompanied By: mother activities of daily living that may affect Transfer Assistance: None risk of falls: Patient Identification Verified: Yes Signs or symptoms of abuse/neglect since last visito No Secondary Verification Process Completed: Yes Hospitalized since last visit: No Patient Requires Transmission-Based Precautions: No Implantable device outside of the clinic excluding No Patient Has Alerts: No cellular tissue based products placed in the center since last visit: Has Dressing in Place as Prescribed: Yes Pain Present Now: No Electronic Signature(s) Signed: 12/21/2020 4:52:32 PM By: Sandre Kitty Entered By: Sandre Kitty on 12/21/2020 13:09:14 -------------------------------------------------------------------------------- Encounter Discharge Information Details Patient Name: Date of Service: Renee Pain, [name] 12/21/2020 12:30 PM Medical Record Number: [id number] Patient Account Number: [id number] Date of Birth/Sex: Treating RN: 06/19/1986 (35 y.o. [name] Primary Care Konnar Ben: Joselyn Arrow Other Clinician: Referring Araly Kaas: Treating Meline Russaw/Extender: Hessie Dibble, [name] Weeks in Treatment: 59 Encounter Discharge Information Items Discharge Condition:  Stable Ambulatory Status: Ambulatory Discharge Destination: Home Transportation: Private Auto Accompanied By: mother Schedule Follow-up Appointment: Yes Clinical Summary of Care: Patient Declined Electronic Signature(s) Signed: 12/24/2020 4:50:33 PM By: Baruch Gouty RN, BSN Entered By: Baruch Gouty on 12/21/2020 14:40:56 -------------------------------------------------------------------------------- Lower Extremity Assessment Details Patient Name: Date of Service: Renee Pain, [name] 12/21/2020 12:30 PM Medical Record Number: [id number] Patient Account Number: [id number] Date of Birth/Sex: Treating RN: 03-03-1986 (36 y.o. [name] Primary Care Velisa Regnier: Joselyn Arrow Other Clinician: Referring Jacelynn Hayton: Treating Bow Buntyn/Extender: Hessie Dibble, [name] Weeks in Treatment: 18 Electronic Signature(s) Signed: 12/21/2020 5:32:23 PM By: Lorrin Jackson Entered By: Lorrin Jackson on 12/21/2020 13:29:50 -------------------------------------------------------------------------------- Multi Wound Chart Details Patient Name: Date of Service: Renee Pain, [name] 12/21/2020 12:30 PM Medical Record Number: [id number] Patient Account Number: [id number] Date of Birth/Sex: Treating RN: [birth year] (35 y.o. [name] Primary Care Dartanian Knaggs: Joselyn Arrow Other Clinician: Referring Nahome Bublitz: Treating Tarence Searcy/Extender: Hessie Dibble, [name] Weeks in Treatment: 18 Vital Signs Height(in): 70 Pulse(bpm): 96 Weight(lbs): 341 Blood Pressure(mmHg): 161/86 Body Mass Index(BMI): 49 Temperature(F): 98.2 Respiratory Rate(breaths/min): 18 Photos: [1:No Photos Sacrum] [N/A:N/A N/A] Wound Location: [1:Pressure Injury] [N/A:N/A] Wounding Event: [1:Pressure Ulcer] [N/A:N/A] Primary Etiology: [1:Anemia, Sleep Apnea, Hypertension, N/A] Comorbid History: [1:Type II Diabetes 05/18/2020] [N/A:N/A] Date Acquired: [1:18] [N/A:N/A] Weeks of  Treatment: [1:Open] [N/A:N/A] Wound Status: [1:0.3x0.3x2.5] [N/A:N/A] Measurements L x W x D (cm) [1:0.071] [N/A:N/A] A (cm) : rea [1:0.177] [N/A:N/A] Volume (cm) : [1:95.00%] [N/A:N/A] % Reduction in A [1:rea: 97.20%] [N/A:N/A] % Reduction in Volume: [1:Category/Stage IV] [N/A:N/A] Classification: [1:Medium] [N/A:N/A] Exudate A mount: [1:Serosanguineous] [N/A:N/A] Exudate Type: [1:red, brown] [N/A:N/A] Exudate Color: [1:Distinct, outline attached] [N/A:N/A] Wound Margin: [1:Large (67-100%)] [N/A:N/A] Granulation A mount: [1:Red] [N/A:N/A] Granulation Quality: [1:None Present (0%)] [N/A:N/A] Necrotic A mount: [1:Fat Layer (Subcutaneous Tissue): Yes N/A] Exposed Structures: [1:Fascia: No Tendon: No Muscle: No Joint: No Bone: No Small (1-33%)] [N/A:N/A] Epithelialization: [1:Debridement - Selective/Open Wound N/A] Debridement: Pre-procedure Verification/Time Out  13:28 [N/A:N/A] Taken: [1:Skin/Dermis] [N/A:N/A] Level: [1:0.09] [N/A:N/A] Debridement A (sq cm): [1:rea Other(Silver Nitrate)] [N/A:N/A] Instrument: [1:None] [N/A:N/A] Bleeding: [1:Procedure was tolerated well] [N/A:N/A] Debridement Treatment Response: [1:0.3x0.3x2.5] [N/A:N/A] Post Debridement Measurements L x W x D (cm) [1:0.177] [N/A:N/A] Post Debridement Volume: (cm) [1:Category/Stage IV] [N/A:N/A] Post Debridement Stage: [1:Debridement] [N/A:N/A] Treatment Notes Electronic Signature(s) Signed: 12/21/2020 5:00:10 PM By: Linton Ham MD Signed: 12/21/2020 5:32:23 PM By: Lorrin Jackson Entered By: Linton Ham on 12/21/2020 14:00:45 -------------------------------------------------------------------------------- Multi-Disciplinary Care Plan Details Patient Name: Date of Service: Renee Pain, [name] 12/21/2020 12:30 PM Medical Record Number: [id number] Patient Account Number: [id number] Date of Birth/Sex: Treating RN: April 03, 1986 (35 y.o. [name] Primary Care Ginnifer Creelman: Joselyn Arrow Other  Clinician: Referring Dwight Burdo: Treating Watt Geiler/Extender: Hessie Dibble, [name] Weeks in Treatment: 18 Multidisciplinary Care Plan reviewed with physician Active Inactive Nutrition Nursing Diagnoses: Potential for alteratiion in Nutrition/Potential for imbalanced nutrition Goals: Patient/caregiver agrees to and verbalizes understanding of need to obtain nutritional consultation Date Initiated: 08/16/2020 Date Inactivated: 09/14/2020 Target Resolution Date: 09/14/2020 Goal Status: Met Patient/caregiver verbalizes understanding of need to maintain therapeutic glucose control per primary care physician Date Initiated: 08/16/2020 Target Resolution Date: 12/28/2020 Goal Status: Active Interventions: Assess HgA1c results as ordered upon admission and as needed Provide education on elevated blood sugars and impact on wound healing Provide education on nutrition Treatment Activities: Obtain HgA1c : 08/16/2020 Patient referred to Primary Care Physician for further nutritional evaluation : 08/16/2020 Notes: 12/07/20: Glucose control ongoing, target date extended. Pressure Nursing Diagnoses: Knowledge deficit related to management of pressures ulcers Potential for impaired tissue integrity related to pressure, friction, moisture, and shear Goals: Patient will remain free from development of additional pressure ulcers Date Initiated: 08/16/2020 Date Inactivated: 09/14/2020 Target Resolution Date: 09/14/2020 Goal Status: Met Patient/caregiver will verbalize understanding of pressure ulcer management Date Initiated: 08/16/2020 Target Resolution Date: 12/28/2020 Goal Status: Active Interventions: Assess: immobility, friction, shearing, incontinence upon admission and as needed Assess potential for pressure ulcer upon admission and as needed Provide education on pressure ulcers Treatment Activities: Consult for HBO : 08/16/2020 Pressure reduction/relief device ordered : 08/16/2020 T ordered  outside of clinic : 08/16/2020 est Notes: Electronic Signature(s) Signed: 12/21/2020 5:32:23 PM By: Lorrin Jackson Entered By: Lorrin Jackson on 12/21/2020 13:30:35 -------------------------------------------------------------------------------- Pain Assessment Details Patient Name: Date of Service: Renee Pain, [name] 12/21/2020 12:30 PM Medical Record Number: [id number] Patient Account Number: [id number] Date of Birth/Sex: Treating RN: 1986/05/22 (35 y.o. [name] Primary Care Esteban Kobashigawa: Joselyn Arrow Other Clinician: Referring Dazha Kempa: Treating Micharl Helmes/Extender: Hessie Dibble, [name] Weeks in Treatment: 18 Active Problems Location of Pain Severity and Description of Pain Patient Has Paino No Site Locations Pain Management and Medication Current Pain Management: Electronic Signature(s) Signed: 12/21/2020 4:52:32 PM By: Sandre Kitty Signed: 12/21/2020 5:32:23 PM By: Lorrin Jackson Entered By: Sandre Kitty on 12/21/2020 13:09:49 -------------------------------------------------------------------------------- Patient/Caregiver Education Details Patient Name: Date of Service: Renee Pain, [name] 5/13/2022andnbsp12:30 PM Medical Record Number: [id number] Patient Account Number: [id number] Date of Birth/Gender: Treating RN: 1986-06-18 (35 y.o. [name] Primary Care Physician: Joselyn Arrow Other Clinician: Referring Physician: Treating Physician/Extender: Hessie Dibble, [name] Weeks in Treatment: 31 Education Assessment Education Provided To: Patient Education Topics Provided Elevated Blood Sugar/ Impact on Healing: Methods: Explain/Verbal, Printed Responses: State content correctly Pressure: Methods: Explain/Verbal, Printed Responses: State content correctly Wound/Skin Impairment: Methods: Explain/Verbal, Printed Responses: State content correctly Electronic Signature(s) Signed: 12/21/2020 5:32:23 PM  By: Lorrin Jackson  Entered By: Lorrin Jackson on 12/21/2020 13:31:01 -------------------------------------------------------------------------------- Wound Assessment Details Patient Name: Date of Service: Renee Pain, [place] M. 12/21/2020 12:30 PM Medical Record Number: [id number] Patient Account Number: [id number] Date of Birth/Sex: Treating RN: [birth year] (35 y.o. [name] Primary Care Leandria Thier: Joselyn Arrow Other Clinician: Referring Berlie Hatchel: Treating Noriah Osgood/Extender: Hessie Dibble, [name] Weeks in Treatment: 18 Wound Status Wound Number: 1 Primary Etiology: Pressure Ulcer Wound Location: Sacrum Wound Status: Open Wounding Event: Pressure Injury Comorbid History: Anemia, Sleep Apnea, Hypertension, Type II Diabetes Date Acquired: 05/18/2020 Weeks Of Treatment: 18 Clustered Wound: No Photos Wound Measurements Length: (cm) 0.3 Width: (cm) 0.3 Depth: (cm) 2.5 Area: (cm) 0.071 Volume: (cm) 0.177 % Reduction in Area: 95% % Reduction in Volume: 97.2% Epithelialization: Small (1-33%) Tunneling: No Undermining: No Wound Description Classification: Category/Stage IV Wound Margin: Distinct, outline attached Exudate Amount: Medium Exudate Type: Serosanguineous Exudate Color: red, brown Foul Odor After Cleansing: No Slough/Fibrino No Wound Bed Granulation Amount: Large (67-100%) Exposed Structure Granulation Quality: Red Fascia Exposed: No Necrotic Amount: None Present (0%) Fat Layer (Subcutaneous Tissue) Exposed: Yes Tendon Exposed: No Muscle Exposed: No Joint Exposed: No Bone Exposed: No Treatment Notes Wound #1 (Sacrum) Cleanser Normal Saline Discharge Instruction: Cleanse the wound with Normal Saline prior to applying a clean dressing using gauze sponges, not tissue or cotton balls. Normal Saline Discharge Instruction: Cleanse the wound with Normal Saline prior to applying a clean dressing using gauze sponges, not tissue or cotton  balls. Peri-Wound Care Skin Prep Discharge Instruction: Use skin prep as directed Topical Primary Dressing Iodoform packing strip 1/4 (in) Discharge Instruction: Lightly pack as instructed Secondary Dressing Zetuvit Plus Silicone Border Dressing 7x7(in/in) Discharge Instruction: Apply silicone border over primary dressing as directed. Secured With Compression Wrap Compression Stockings Environmental education officer) Signed: 12/21/2020 4:52:32 PM By: Sandre Kitty Signed: 12/21/2020 5:32:23 PM By: Lorrin Jackson Entered By: Sandre Kitty on 12/21/2020 16:34:58 -------------------------------------------------------------------------------- Vitals Details Patient Name: Date of Service: Renee Pain, [name] 12/21/2020 12:30 PM Medical Record Number: [id number] Patient Account Number: [id number] Date of Birth/Sex: Treating RN: 1985/10/03 (35 y.o. [name] Primary Care Donnis Phaneuf: Joselyn Arrow Other Clinician: Referring Catherina Pates: Treating Sherill Wegener/Extender: Hessie Dibble, [name] Weeks in Treatment: 18 Vital Signs Time Taken: 13:09 Temperature (F): 98.2 Height (in): 70 Pulse (bpm): 96 Weight (lbs): 341 Respiratory Rate (breaths/min): 18 Body Mass Index (BMI): 48.9 Blood Pressure (mmHg): 161/86 Reference Range: 80 - 120 mg / dl Electronic Signature(s) Signed: 12/21/2020 4:52:32 PM By: Sandre Kitty Entered By: Sandre Kitty on 12/21/2020 13:09:43

## 2020-12-25 ENCOUNTER — Ambulatory Visit
Admission: RE | Admit: 2020-12-25 | Discharge: 2020-12-25 | Disposition: A | Discharge status: Home / Self Care | Payer: BC Managed Care – PPO | Source: Ambulatory Visit | Attending: Neurosurgery | Admitting: Neurosurgery

## 2020-12-25 ENCOUNTER — Other Ambulatory Visit: Payer: Self-pay

## 2020-12-25 DIAGNOSIS — S143XXA Injury of brachial plexus, initial encounter: Secondary | ICD-10-CM

## 2020-12-25 IMAGING — MR MR SHOULDER*L* W/O CM
6 series · 40 of 40 positions shown · non-contrast
Comparison: MRI cervical spine [DATE].

CLINICAL DATA: Left anterior shoulder/chest pain with numbness and
swelling down left arm for 8 months

EXAM:
MRI BRACHIAL PLEXUS WITHOUT CONTRAST
TECHNIQUE: Multiplanar, multiecho pulse sequences of the neck and surrounding
structures were obtained without intravenous contrast. The field of
view was focused on the leftbrachial plexus from the neural foramina
to the axilla.

[Series 3: T1 · axial · 3.0mm · 1.12mm/px · z∈[-111,+100]mm · 8 of 54 slices shown (1 of 3)]
[im 1/54]
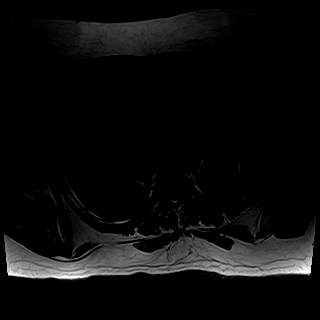
[im 8/54]
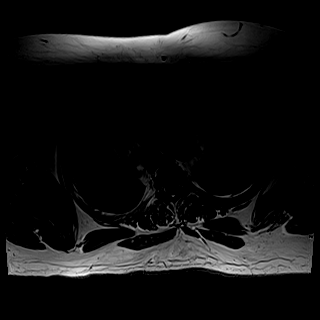
[im 16/54]
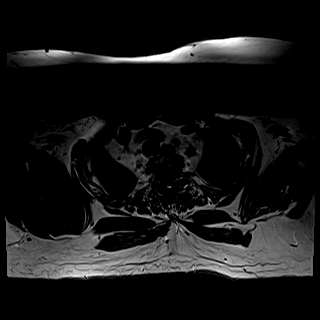
[im 23/54]
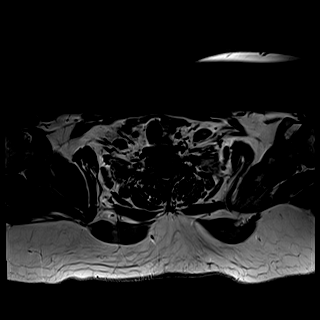
[im 31/54]
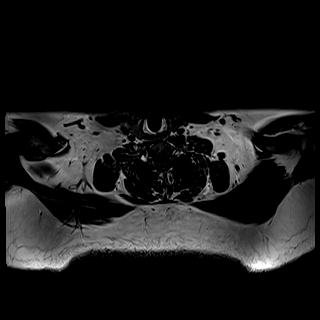
[im 38/54]
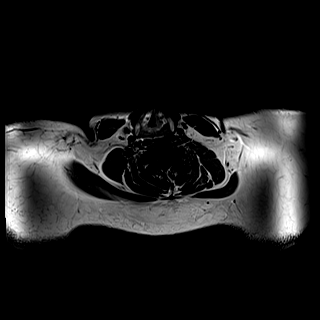
[im 46/54]
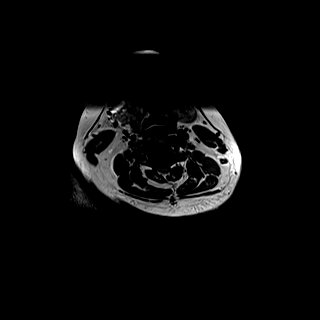
[im 54/54]
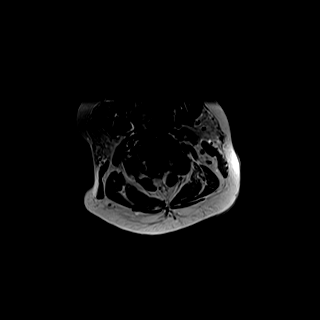

[Series 4: T2 fat-sat · axial · 3.0mm · 1.33mm/px · z∈[-111,+100]mm · 8 of 54 slices shown]
[im 1/54]
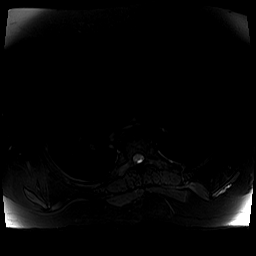
[im 8/54]
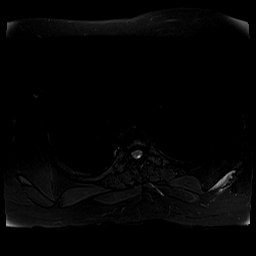
[im 16/54]
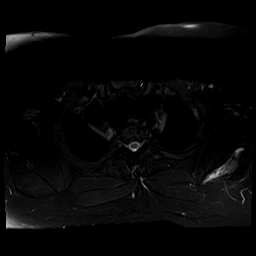
[im 23/54]
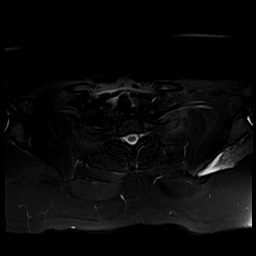
[im 31/54]
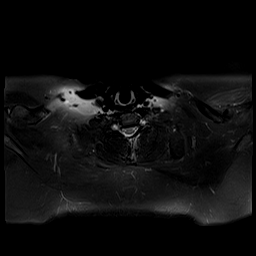
[im 38/54]
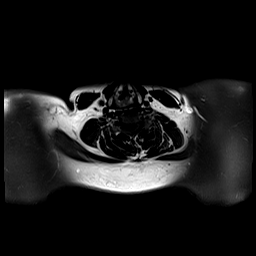
[im 46/54]
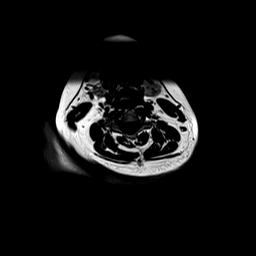
[im 54/54]
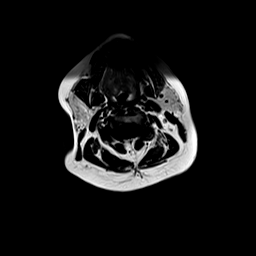

[Series 5: T1 · coronal · 3.0mm · 0.75mm/px · 5 of 30 slices shown (2 of 3)]
[im 1/30]
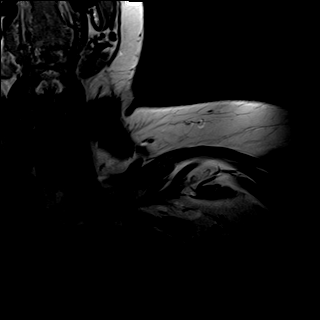
[im 8/30]
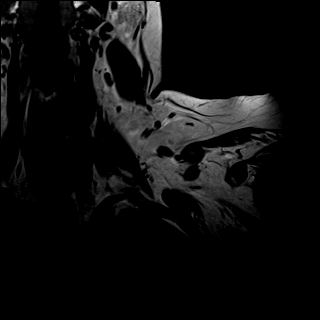
[im 15/30]
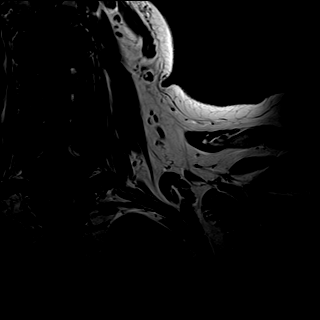
[im 22/30]
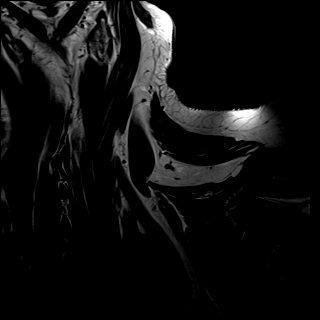
[im 30/30]
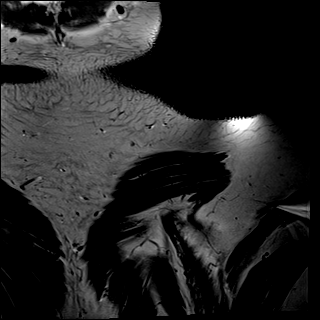

[Series 6: STIR · coronal · 3.0mm · 0.94mm/px · 5 of 30 slices shown (1 of 2)]
[im 1/30]
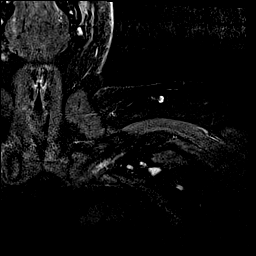
[im 8/30]
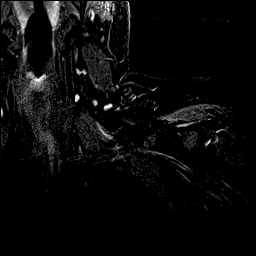
[im 15/30]
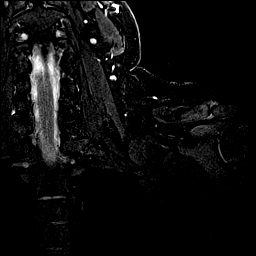
[im 22/30]
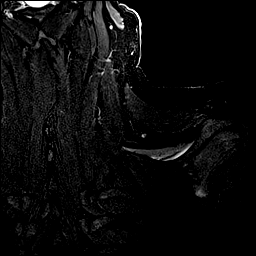
[im 30/30]
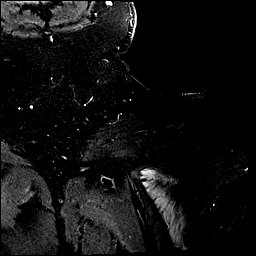

[Series 7: T1 · sagittal · 3.0mm · 0.75mm/px · 7 of 48 slices shown (3 of 3)]
[im 1/48]
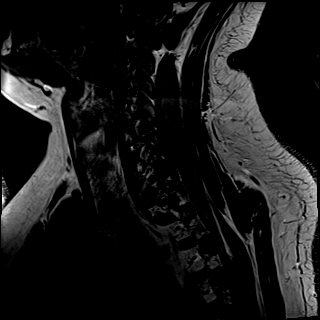
[im 8/48]
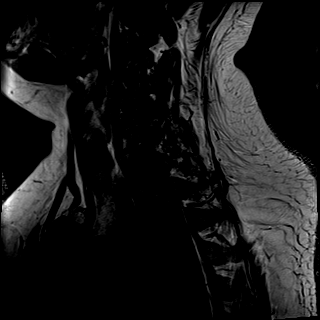
[im 16/48]
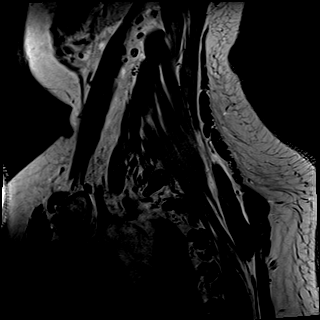
[im 24/48]
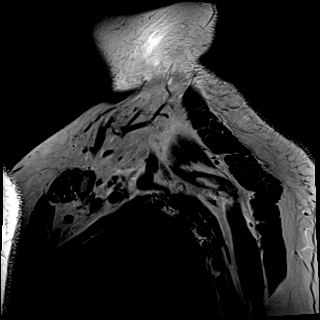
[im 32/48]
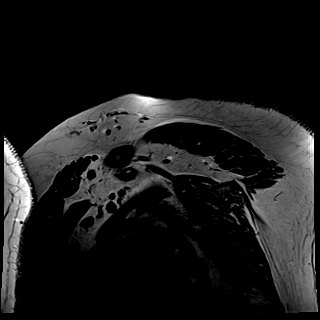
[im 40/48]
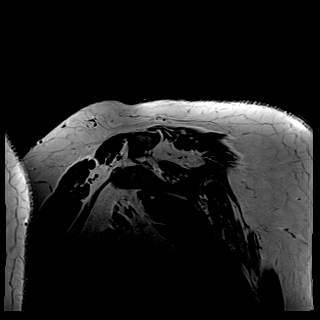
[im 48/48]
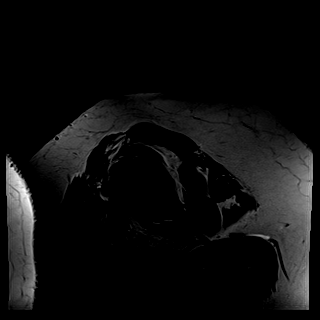

[Series 8: STIR · sagittal · 3.0mm · 0.75mm/px · 7 of 48 slices shown (2 of 2)]
[im 1/48]
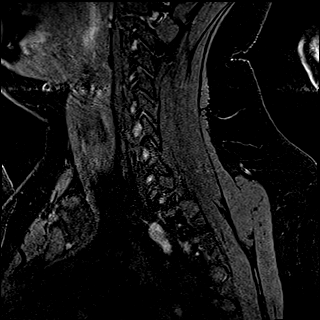
[im 8/48]
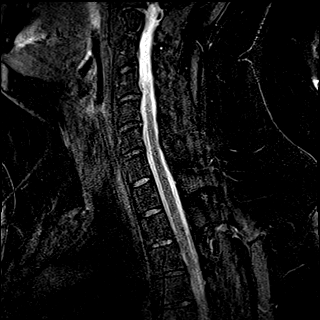
[im 16/48]
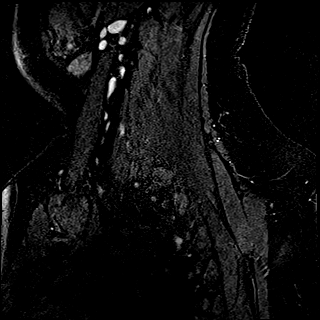
[im 24/48]
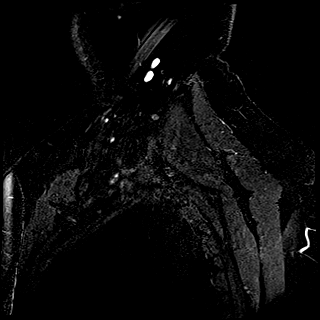
[im 32/48]
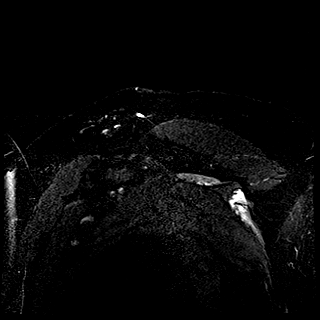
[im 40/48]
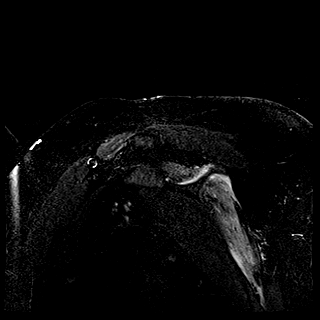
[im 48/48]
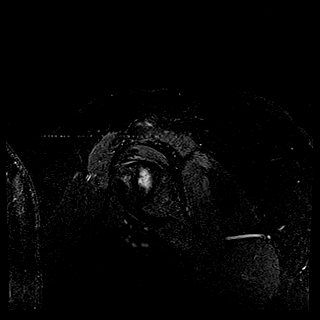

[40 of 40 positions shown; findings below may reference images not displayed]

FINDINGS: Spinal cord

Normal caliber and signal of visualized spinal cord.

Brachial plexus

Roots: There is increased T2 signal of the C5 nerve root.

Trunks: Increased T2 signal of the superior trunk. The suprascapular
nerve is visible coursing towards the shoulder demonstrating
increased T2 signal.

Divisions: Increased T2 signal of the anterior superior division.

Cords: Increased T2 signal of the cords.

Branches: Likely increased signal of some distal branches.

Muscles and tendons

There is extensive intramuscular edema of the supraspinatus and
infraspinatus muscles.

Bones

Cervical spine: Limited sagittal images of the cervical spine
demonstrate minimal posterior disc bulging at C4-C5, without visible
significant spinal canal or neural foraminal narrowing.

C7 transverse processes: Normal.

Marrow signal: There is and generalized low T1 signal of the
visualized bone marrow.

Other findings

Likely post inflammatory changes in the lungs.
IMPRESSION: Brachial plexopathy involving contributions from the C5 nerve roots,
as evidenced by increased T2 signal of the C5 root, superior trunk,
anterior superior division, with milder edema visible within the
cords and distal branches. Minimal disc bulging at C4-C5 without
visible spinal canal or neural foraminal narrowing.

Increased T2 signal of the suprascapular nerve coursing towards the
left shoulder, with denervation edema within the supraspinatus and
infraspinatus muscles, and with mild developing muscle atrophy
(Grade 1 at this time).

Generalized low T1 bone marrow signal throughout, which can be seen
in the setting of hematologic derangement, suggest correlation with
complete blood count with differential.

## 2020-12-31 ENCOUNTER — Ambulatory Visit (INDEPENDENT_AMBULATORY_CARE_PROVIDER_SITE_OTHER): Payer: BC Managed Care – PPO | Admitting: Internal Medicine

## 2020-12-31 ENCOUNTER — Other Ambulatory Visit: Payer: Self-pay

## 2020-12-31 ENCOUNTER — Encounter: Payer: Self-pay | Admitting: Internal Medicine

## 2020-12-31 ENCOUNTER — Encounter: Payer: Self-pay | Admitting: Occupational Therapy

## 2020-12-31 ENCOUNTER — Ambulatory Visit: Payer: BC Managed Care – PPO | Admitting: Occupational Therapy

## 2020-12-31 DIAGNOSIS — M6281 Muscle weakness (generalized): Secondary | ICD-10-CM

## 2020-12-31 DIAGNOSIS — M4628 Osteomyelitis of vertebra, sacral and sacrococcygeal region: Secondary | ICD-10-CM | POA: Diagnosis not present

## 2020-12-31 DIAGNOSIS — M25612 Stiffness of left shoulder, not elsewhere classified: Secondary | ICD-10-CM | POA: Diagnosis present

## 2020-12-31 DIAGNOSIS — M25642 Stiffness of left hand, not elsewhere classified: Secondary | ICD-10-CM | POA: Diagnosis not present

## 2020-12-31 DIAGNOSIS — M25632 Stiffness of left wrist, not elsewhere classified: Secondary | ICD-10-CM | POA: Diagnosis present

## 2020-12-31 DIAGNOSIS — M25622 Stiffness of left elbow, not elsewhere classified: Secondary | ICD-10-CM

## 2020-12-31 DIAGNOSIS — R600 Localized edema: Secondary | ICD-10-CM

## 2020-12-31 NOTE — Assessment & Plan Note (Signed)
Pt is s/p 6 weeks of daptomycin, ceftriaxone, and metronidazole completed 11/30/20 and he is back working at [employer].  He reports doing well during the past several weeks off antibiotics.  Has been continuing with wound care and last saw them on 5/13 with improved wound size and no evidence of infection at that time.  His mother showed a couple photos today of wound after recent packing/dressing change with concern for superficial infection vs irritation from new wound care materials (I believe was switched to iodoform).  They have appointment this Thursday with wound care to reassess and discuss if irritation may be related to new materials as new infection not obvious.  Will not prescribe further antibiotics at this time pending Wound care evaluation.  Discussed with patient that he should continue with wound care, off loading as much as possible, and keeping the area free of stool or urine to prevent contamination.  Will have him follow up as needed at this time for any new issues or concerns.

## 2020-12-31 NOTE — Patient Instructions (Signed)
Thank you for coming to see me today. It was a pleasure seeing you.  To Do: Marland Kitchen Continue with wound care like you have been doing . Please follow up with me as needed for any issues  If you have any questions or concerns, please do not hesitate to call the office at [phone].  Take Care,   Gwynn Burly, DO

## 2020-12-31 NOTE — Progress Notes (Signed)
Seeing you for PCP. For review. Thanks.

## 2020-12-31 NOTE — Progress Notes (Signed)
[facility]  CHIEF COMPLAINT:    Follow up for sacral OM  SUBJECTIVE:    Collin Brown is a 35 y.o. male with PMHx as below who presents to the clinic for sacral OM.   Please see A&P for the details of today's visit and status of the patient's medical problems.   Patient's Medications  New Prescriptions   No medications on file  Previous Medications   ASCORBIC ACID (VITAMIN C) 500 MG TABLET    Take 1 tablet (500 mg total) by mouth daily.   ASPIRIN 81 MG CHEWABLE TABLET    Chew 1 tablet (81 mg total) by mouth daily.   BLOOD GLUCOSE MONITORING SUPPL (ACCU-CHEK GUIDE ME) W/DEVICE KIT    USE TO CHECK BLOOD SUGAR UP TO 4 TIMES DAILY BEFORE MEALS AS DIRECTED   GABAPENTIN (NEURONTIN) 800 MG TABLET    Take 1 tablet (800 mg total) by mouth 4 (four) times daily as needed.   INSULIN PEN NEEDLE (CAREFINE PEN NEEDLES) 32G X 6 MM MISC    1 applicator by Does not apply route 4 (four) times daily -  with meals and at bedtime.   LANTUS SOLOSTAR 100 UNIT/ML SOLOSTAR PEN    Inject into the skin.   METOPROLOL TARTRATE (LOPRESSOR) 50 MG TABLET    Take 1.5 tablets (75 mg total) by mouth 2 (two) times daily.   MULTIPLE VITAMIN (MULTIVITAMIN WITH MINERALS) TABS TABLET    Take 1 tablet by mouth daily.   OXYCODONE (ROXICODONE) 5 MG IMMEDIATE RELEASE TABLET    Take 1 tablet (5 mg total) by mouth every 6 (six) hours as needed for severe pain.   OXYCODONE-ACETAMINOPHEN (PERCOCET) 10-325 MG TABLET    Take 1 tablet by mouth every 6 (six) hours as needed for pain.   POTASSIUM CHLORIDE SA (KLOR-CON) 20 MEQ TABLET    Take 2 tablets by mouth twice daily  Modified Medications   No medications on file  Discontinued Medications   No medications on file      Past Medical History:  Diagnosis Date  . Acute blood loss anemia   . Critical illness myopathy   . Diabetes mellitus without complication ([place])   . Dyspnea 08/23/2020  . Hearing loss in right ear   . Hypertension   .  Pneumonia due to COVID-19 virus 05/04/2020  . Sleep apnea     Social History   Tobacco Use  . Smoking status: Never Smoker  . Smokeless tobacco: Never Used  Vaping Use  . Vaping Use: Never used  Substance Use Topics  . Alcohol use: No  . Drug use: No    Family History  Problem Relation Age of Onset  . Diabetes Mellitus II Mother   . Hypertension Mother   . Diabetes Mother   . Diabetes Mellitus II Father   . High blood pressure Father   . Heart attack Father     No Known Allergies  Review of Systems  Constitutional: Negative.   Respiratory: Negative.   Cardiovascular: Negative.   Skin: Positive for rash.  All other systems reviewed and are negative.    OBJECTIVE:    Vitals:   12/31/20 1049 12/31/20 1050  BP:  (!) 161/94  Pulse:  88  Temp:  97.9 F (36.6 C)  SpO2:  94%  Weight: (!) 386 lb 9.6 oz (175.4 kg)    Body mass index is 55.47 kg/m.  Physical Exam Constitutional:  General: He is not in acute distress.    Appearance: Normal appearance.  HENT:     Head: Normocephalic and atraumatic.  Pulmonary:     Effort: Pulmonary effort is normal. No respiratory distress.  Musculoskeletal:     Comments: Left arm in brace.  Pictures reviewed on phone of wound.   Skin:    General: Skin is warm and dry.  Neurological:     General: No focal deficit present.     Mental Status: He is alert and oriented to person, place, and time.  Psychiatric:        Mood and Affect: Mood normal.        Behavior: Behavior normal.      Labs and Microbiology: CBC Latest Ref Rng & Units 10/15/2020 08/23/2020 07/09/2020  WBC 3.8 - 10.8 Thousand/uL 7.7 7.9 7.5  Hemoglobin 13.2 - 17.1 g/dL 14.4 13.4 11.5(L)  Hematocrit 38.5 - 50.0 % 43.2 41.5 37.0(L)  Platelets 140 - 400 Thousand/uL 278 294 339   CMP Latest Ref Rng & Units 10/15/2020 08/23/2020 07/06/2020  Glucose 65 - 99 mg/dL 244(H) 108(H) 104(H)  BUN 7 - 25 mg/dL '15 11 9  ' Creatinine 0.60 - 1.35 mg/dL 0.73 0.57(L) 0.56(L)   Sodium 135 - 146 mmol/L 137 139 137  Potassium 3.5 - 5.3 mmol/L 4.4 4.7 4.0  Chloride 98 - 110 mmol/L 102 102 99  CO2 20 - 32 mmol/L '25 22 25  ' Calcium 8.6 - 10.3 mg/dL 10.2 10.0 9.6  Total Protein 6.1 - 8.1 g/dL 6.5 6.3 -  Total Bilirubin 0.2 - 1.2 mg/dL 0.3 0.3 -  Alkaline Phos 44 - 121 IU/L - 68 -  AST 10 - 40 U/L 17 17 -  ALT 9 - 46 U/L 30 26 -       ASSESSMENT & PLAN:    Sacral osteomyelitis (HCC) Pt is s/p 6 weeks of daptomycin, ceftriaxone, and metronidazole completed 11/30/20 and he is back working at [employer].  He reports doing well during the past several weeks off antibiotics.  Has been continuing with wound care and last saw them on 5/13 with improved wound size and no evidence of infection at that time.  His mother showed a couple photos today of wound after recent packing/dressing change with concern for superficial infection vs irritation from new wound care materials (I believe was switched to iodoform).  They have appointment this Thursday with wound care to reassess and discuss if irritation may be related to new materials as new infection not obvious.  Will not prescribe further antibiotics at this time pending Wound care evaluation.  Discussed with patient that he should continue with wound care, off loading as much as possible, and keeping the area free of stool or urine to prevent contamination.  Will have him follow up as needed at this time for any new issues or concerns.    No orders of the defined types were placed in this encounter.       [facility] 12/31/2020, 11:32 AM

## 2020-12-31 NOTE — Therapy (Signed)
[facility] [address], [place], [address] Phone: [phone]   Fax:  [phone]  Occupational Therapy Evaluation  Patient Details  Name: Collin Brown MRN: [id number] Date of Birth: [birth year] Referring Provider (OT): Dr Vladimir Creeks,   Encounter Date: 12/31/2020   OT End of Session - 12/31/20 1913    Visit Number 1    Number of Visits 24    Date for OT Re-Evaluation 03/25/21    OT Start Time 0733    OT Stop Time 0830    OT Time Calculation (min) 57 min    Activity Tolerance Patient tolerated treatment well    Behavior During Therapy [facility] for tasks assessed/performed           Past Medical History:  Diagnosis Date  . Acute blood loss anemia   . Critical illness myopathy   . Diabetes mellitus without complication (HCC)   . Dyspnea 08/23/2020  . Hearing loss in right ear   . Hypertension   . Pneumonia due to COVID-19 virus 05/04/2020  . Sleep apnea     Past Surgical History:  Procedure Laterality Date  . TONSILLECTOMY    . WOUND DEBRIDEMENT N/A 06/29/2020   Procedure: DEBRIDEMENT SACRAL WOUND;  Surgeon: Axel Filler, MD;  Location: [facility] OR;  Service: General;  Laterality: N/A;    There were no vitals filed for this visit.   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 male who unfortunately contracted COVID in September 2021. He was ventilated for prolonged period of time and went in recovery notice he had bilateral arm weakness. His right upper extremity has since resolved with exception of some residual numbness  and tingling in his fourth and fifth digit however he has noticed little improvement to his left upper extremity. He has had increased shoulder and elbow movement over the last couple of months, but still overall little improvement compared to original baseline. He has been undergoing physical and occupational therapy and his pain has decreased over time. Detailed physical examination is above. However patient does have dependent edema in his left hand as well as a significant left wrist and fingers contracture. He is tender to palpation the proximal aspect of his left arm and does have anhidrosis noted with significant amount of dry skin on his left forearm. 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 C7-T1 is working, but very difficult to assess due to severe contractures and frozen joints. Would like the patient to undergo evaluation from orthopedic hand surgeon. At this point his bicep strength would be very hard to beat with a nerve transfer for elbow flexion, his main limitation in this regard is an elbow extension contracture. However possible consideration for radial to axillary nerve transfer and spinal accessory to suprascapular nerve transfer for increased movement of his shoulder depending on his progress. Potential options  were discussed with the patient and his mother. We would like patient to see occupational therapist who specializes in brachial plexus injuries. We would like to see him back in clinic in approximately 2 months for reevaluation and progress. He was encouraged to reach out to us in the meantime for any questions or concerns.    Patient Stated Goals Want to be able to use my L hand to bath, lift or grip , play games, put my belt on , pick up boxes and do my job    Currently in Pain? Yes    Pain Score 7     Pain Location Arm    Pain Orientation Left    Pain Descriptors / Indicators  Aching;Tender;Tightness;Pins and needles    Pain Type Acute pain    Pain Onset More than a month ago    Pain Frequency Intermittent             [facility] OT Assessment - 12/31/20 0001      Assessment   Medical Diagnosis L UE brachial plexus injury and lymphedema    Referring Provider (OT) Dr Vladimir CreeksBacigalopo,    Onset Date/Surgical Date --   Sept 21   Hand Dominance Right    [facility]      Home  Environment   Lives With --   MOM     Prior Function   Vocation Full time employment    Leisure work at [employer], likes to play games, poker , gym , visit friends,      AROM   Left Forearm Pronation 90 Degrees    Left Forearm Supination 45 Degrees    Left Wrist Extension -25 Degrees    Left Wrist Flexion 52 Degrees    Left Wrist Radial Deviation 20 Degrees    Left Wrist Ulnar Deviation 5 Degrees      Left Hand AROM   L Thumb MCP 0-60 35 Degrees    L Thumb IP 0-80 65 Degrees    L Thumb Radial ADduction/ABduction 0-55 40    L Thumb Palmar ADduction/ABduction 0-45 50    L Thumb Opposition to Index --   no able   L Index  MCP 0-90 30 Degrees    L Index PIP 0-100 40 Degrees    L Long  MCP 0-90 30 Degrees    L Long PIP 0-100 45 Degrees    L Ring  MCP 0-90 30 Degrees    L Ring PIP 0-100 30 Degrees    L Little  MCP 0-90 25 Degrees    L Little PIP 0-100 40 Degrees            Review with pt and mom HEP  Contrast 2-3 x day  Compression glove most all the time -tubigrip E hand to elbow Soft tissue mobs MC spreads, webspace prior To ROM   AAROM for MC flexion , intrinsic fist  Thumb PA and RA  Composite flexion of thumb  10 reps  Wrist AAROM RD, UD , sup and wrist flexion and extnetion - 10 reps  Elbow flexion at side -and ext rotation with elbow to side                  OT Education - 12/31/20 1913    Education Details findings of eval and HEP    Person(s) Educated Patient;Parent(s)    Methods Explanation;Demonstration;Tactile cues;Verbal  cues;Handout    Comprehension Verbal cues required;Returned demonstration;Verbalized understanding  OT Short Term Goals - 12/31/20 2024      OT SHORT TERM GOAL #1   Title Pt and mom to be independent in HEP to decrease edema and increase AROM in forearm to digits    Baseline no lknowledge- edema increase digits to wrist , AROM decrease in wrist , sup , digits flexion and thumb - see lfowsheet    Time 4    Period Weeks    Status New    Target Date 01/28/21             OT Long Term Goals - 12/31/20 2026      OT LONG TERM GOAL #1   Title L wrist AROM increase by 15-20 degrees for sup, extention, flexion , UD to carry or hold objects on palm and push up from chair    Baseline wrist -25 ext, flexion 52, UD 5, sup 45    Time 6    Period Weeks    Status New    Target Date 02/11/21      OT LONG TERM GOAL #2   Title L digits flexion increase by more than 20 degrees in MC and PIP to hold 2 inch cylinder objects in ADL's and at work    Baseline MC flexion 25-30 degrees, PIP's 30-45 degrees - no grip    Time 6    Period Weeks    Status New    Target Date 02/11/21      OT LONG TERM GOAL #3   Title L digits AROM increase for pt to touch palm to use in 25% of ADL's    Baseline see goal 2 for base line - no grip -and no using hand in any ADL's    Time 12    Period Weeks    Status New    Target Date 03/25/21      OT LONG TERM GOAL #4   Title update HEP for strengthening and proximal ROM as progress    Time 6    Period Weeks    Status New    Target Date 02/11/21                 Plan - 12/31/20 1920    Clinical Impression Statement Mr. Adachi 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 C7-T1 during his COVID that he was hospitalize for from Sept thru Dec - and then had [facility] -and return back to work about 4 wks ago - but not able to use his L UE - pt present this date with very little shoulder AROM , elbow flexion to chest 2+/5, extention 3+/5,  but increase edema and flexor contractures in forearm sup, wrist in all planes and digits ROM - only about 25-30 degrees of AROM at MC's and PIP's 30-45 flexoin, thumb decrease in all planes - wrist decrease and supination in all planes - unable to use L hand and arm in ADL's or IADL's - pain increase with ROM to 6-7/10 , edema mostly increase in digis to wrist , Pt can benefit from OT services to decrease edema, stiffness and pain -and increase ROM and strength .    OT Occupational Profile and History Problem Focused Assessment - Including review of records relating to presenting problem    Occupational performance deficits (Please refer to evaluation for details): ADL's;IADL's;Work;Play;Leisure;Social Participation    Body Structure / Function / Physical Skills ADL;Coordination;Flexibility;Edema;Dexterity;IADL;UE functional use;Strength;Decreased knowledge of use of DME;FMC;Pain    Rehab Potential Fair  Clinical Decision Making Several treatment options, min-mod task modification necessary    Comorbidities Affecting Occupational Performance: May have comorbidities impacting occupational performance   COVID for 3 months hospitilize and on ventilotar   Modification or Assistance to Complete Evaluation  Min-Moderate modification of tasks or assist with assess necessary to complete eval    OT Frequency 2x / week    OT Duration 12 weeks    OT Treatment/Interventions Self-care/ADL training;Paraffin;Moist Heat;Fluidtherapy;Contrast Bath;Therapeutic exercise;Manual Therapy;Patient/family education;Passive range of motion;Splinting;DME and/or AE instruction    Consulted and Agree with Plan of Care Patient           Patient will benefit from skilled therapeutic intervention in order to improve the following deficits and impairments:   Body Structure / Function / Physical Skills: ADL,Coordination,Flexibility,Edema,Dexterity,IADL,UE functional use,Strength,Decreased knowledge of use of DME,FMC,Pain        Visit Diagnosis: Stiffness of left hand, not elsewhere classified - Plan: Ot plan of care cert/re-cert  Stiffness of left shoulder, not elsewhere classified - Plan: Ot plan of care cert/re-cert  Stiffness of left wrist, not elsewhere classified - Plan: Ot plan of care cert/re-cert  Stiffness of left elbow, not elsewhere classified - Plan: Ot plan of care cert/re-cert  Localized edema - Plan: Ot plan of care cert/re-cert  Muscle weakness (generalized) - Plan: Ot plan of care cert/re-cert    Problem List Patient Active Problem List   Diagnosis Date Noted  . Lymphedema of arm 12/11/2020  . 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 Sleeps in sitting position due to orthopnea 10/02/2020  . Obesity with alveolar hypoventilation and body mass index (BMI) of 40 or greater (HCC) 10/02/2020  . Chronic intermittent hypoxia with obstructive sleep apnea 10/02/2020  . Pressure ulcer of sacral region, stage 4 (HCC) 09/05/2020  . Diabetes mellitus (HCC) 09/05/2020  . Dyspnea 08/23/2020  . Drug induced constipation   . Hypokalemia   . 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 Debility 06/08/2020  . Critical illness myopathy   . Supplemental oxygen dependent   . Essential hypertension   . Brachial plexopathy   . Neuropathic pain   . Sleep apnea     Oletta Cohn OTR/l,CLT 12/31/2020, 8:32 PM  [facility] PHYSICAL AND SPORTS MEDICINE [address], [place], [address] Phone: [phone]   Fax:  [phone]  Name: Collin Brown MRN: [id number] Date of Birth: Jun 30, 1986

## 2021-01-01 ENCOUNTER — Encounter: Payer: BC Managed Care – PPO | Attending: Physical Medicine and Rehabilitation | Admitting: Registered Nurse

## 2021-01-01 ENCOUNTER — Encounter: Payer: Self-pay | Admitting: Registered Nurse

## 2021-01-01 VITALS — BP 135/85 | HR 87 | Temp 98.5°F | Ht 70.0 in | Wt 385.0 lb

## 2021-01-01 DIAGNOSIS — G7281 Critical illness myopathy: Secondary | ICD-10-CM | POA: Diagnosis present

## 2021-01-01 DIAGNOSIS — G894 Chronic pain syndrome: Secondary | ICD-10-CM | POA: Insufficient documentation

## 2021-01-01 DIAGNOSIS — Z5181 Encounter for therapeutic drug level monitoring: Secondary | ICD-10-CM | POA: Insufficient documentation

## 2021-01-01 DIAGNOSIS — M545 Low back pain, unspecified: Secondary | ICD-10-CM | POA: Diagnosis present

## 2021-01-01 DIAGNOSIS — L89154 Pressure ulcer of sacral region, stage 4: Secondary | ICD-10-CM | POA: Diagnosis not present

## 2021-01-01 DIAGNOSIS — Z79891 Long term (current) use of opiate analgesic: Secondary | ICD-10-CM | POA: Insufficient documentation

## 2021-01-01 DIAGNOSIS — G8929 Other chronic pain: Secondary | ICD-10-CM | POA: Diagnosis present

## 2021-01-01 MED ORDER — GABAPENTIN 800 MG PO TABS: 800.0000 mg | ORAL_TABLET | Freq: Four times a day (QID) | ORAL | 1 refills | Status: DC | PRN

## 2021-01-01 MED ORDER — OXYCODONE-ACETAMINOPHEN 5-325 MG PO TABS: 1.0000 | ORAL_TABLET | Freq: Four times a day (QID) | ORAL | 0 refills | Status: DC | PRN

## 2021-01-01 NOTE — Progress Notes (Signed)
Subjective:    Patient ID: Collin Brown, male    DOB: 03-24-1986, 35 y.o.   MRN: [id number]  HPI: Collin Brown is a 35 y.o. male who returns for follow up appointment for chronic pain and medication refill. He states his  pain is located in his left arm and lower back pain.  He rates his pain 4. His current exercise regime is walking.   [name] equivalent is 30.00 MME.  Last UDS was Performed on 10/11/2020, it was consistent.    Pain Inventory Average Pain 5 Pain Right Now 4 My pain is sharp, burning, stabbing and tingling  In the last 24 hours, has pain interfered with the following? General activity 8 Relation with others 7 Enjoyment of life 5 What TIME of day is your pain at its worst? daytime and night Sleep (in general) NA  Pain is worse with: walking, bending, sitting, standing and some activites Pain improves with: rest and medication Relief from Meds: 8  Family History  Problem Relation Age of Onset  . Diabetes Mellitus II Mother   . Hypertension Mother   . Diabetes Mother   . Diabetes Mellitus II Father   . High blood pressure Father   . Heart attack Father    Social History   Socioeconomic History  . Marital status: Single    Spouse name: Not on file  . Number of children: Not on file  . Years of education: Not on file  . Highest education level: Not on file  Occupational History  . Occupation: Risk manager  Tobacco Use  . Smoking status: Never Smoker  . Smokeless tobacco: Never Used  Vaping Use  . Vaping Use: Never used  Substance and Sexual Activity  . Alcohol use: No  . Drug use: No  . Sexual activity: Yes    Birth control/protection: None  Other Topics Concern  . Not on file  Social History Narrative  . Not on file   Social Determinants of Health   Financial Resource Strain: Not on file  Food Insecurity: Not on file  Transportation Needs: Not on file  Physical Activity: Not on file  Stress: Not on file  Social  Connections: Not on file   Past Surgical History:  Procedure Laterality Date  . TONSILLECTOMY    . WOUND DEBRIDEMENT N/A 06/29/2020   Procedure: DEBRIDEMENT SACRAL WOUND;  Surgeon: Axel Filler, MD;  [facility] OR;  Service: General;  Laterality: N/A;   Past Surgical History:  Procedure Laterality Date  . TONSILLECTOMY    . WOUND DEBRIDEMENT N/A 06/29/2020   Procedure: DEBRIDEMENT SACRAL WOUND;  Surgeon: Axel Filler, MD;  Location: [facility] OR;  Service: General;  Laterality: N/A;   Past Medical History:  Diagnosis Date  . Acute blood loss anemia   . Critical illness myopathy   . Diabetes mellitus without complication (HCC)   . Dyspnea 08/23/2020  . Hearing loss in right ear   . Hypertension   . Pneumonia due to COVID-19 virus 05/04/2020  . Sleep apnea    BP 135/85   Pulse 87   Temp 98.5 F (36.9 C)   Ht 5\' 10"  (1.778 m)   Wt (!) 385 lb (174.6 kg)   SpO2 94%   BMI 55.24 kg/m   Opioid Risk Score:   Fall Risk Score:  `1  Depression screen PHQ 2/9  Depression screen [facility] 2/9 12/31/2020 12/20/2020 11/30/2020 11/20/2020 11/08/2020 10/15/2020 09/07/2020  Decreased Interest 0 0 2 2 1  0  0  Down, Depressed, Hopeless 0 0 1 1 1  0 0  PHQ - 2 Score 0 0 3 3 2  0 0  Altered sleeping - - 0 0 1 - -  Tired, decreased energy - - 2 2 - - -  Change in appetite - - 3 3 - - -  Feeling bad or failure about yourself  - - 2 2 - - -  Trouble concentrating - - 0 0 - - -  Moving slowly or fidgety/restless - - 1 1 - - -  Suicidal thoughts - - 0 0 - - -  PHQ-9 Score - - 11 11 3  - -  Difficult doing work/chores - - Somewhat difficult Somewhat difficult - - -      Review of Systems  Constitutional: Negative.   HENT: Negative.   Eyes: Negative.   Respiratory: Negative.   Cardiovascular: Negative.   Gastrointestinal: Negative.   Endocrine: Negative.   Genitourinary: Negative.   Musculoskeletal: Positive for back pain.       LOWER BACK , LEFT ARM  Skin: Negative.   Allergic/Immunologic:  Negative.   Neurological: Negative.   Hematological: Negative.   Psychiatric/Behavioral: Negative.        Objective:   Physical Exam Vitals and nursing note reviewed.  Constitutional:      Appearance: Normal appearance.  Cardiovascular:     Rate and Rhythm: Normal rate and regular rhythm.     Pulses: Normal pulses.     Heart sounds: Normal heart sounds.  Pulmonary:     Effort: Pulmonary effort is normal.     Breath sounds: Normal breath sounds.  Musculoskeletal:     Cervical back: Normal range of motion and neck supple.     Comments: Normal Muscle Bulk and Muscle Testing Reveals:  Upper Extremities: Right : Full ROM and Muscle Strength 5/5 Left Lower Extremity: Decreased ROM 20 Degrees and Muscle Strength 2/5 Lumbar Paraspinal Tenderness: L-4-L-5 Lower Extremities: Full ROM and Muscle Strength 5/5 Arises from Table slowly Narrow Based  Gait   Skin:    General: Skin is warm and dry.  Neurological:     Mental Status: He is alert and oriented to person, place, and time.  Psychiatric:        Mood and Affect: Mood normal.        Behavior: Behavior normal.           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 Continue to monitor. 12/27/2020 2. Chronic Low Back Pain without Sciatica: Continue HEP as Tolerated. Continue to monitor. 12/27/2020 3. Sacral Wound:  Wound Center Following. Continue to monitor. 12/27/2020 4. Chronic Pain Syndrome: RX: Oxycodone 5/325 mg mg one tablet every 6 hours as needed for pain #120.  We will continue the opioid monitoring program, this consists of regular clinic visits, examinations, urine drug screen, pill counts as well as use of 12/29/2020 Controlled Substance Reporting system. A 12 month History has been reviewed on the 12/29/2020 Controlled Substance Reporting Systemon 12/27/2020.   F/U in 1 month

## 2021-01-01 NOTE — Progress Notes (Signed)
For review

## 2021-01-02 ENCOUNTER — Other Ambulatory Visit: Payer: Self-pay

## 2021-01-02 ENCOUNTER — Ambulatory Visit: Payer: BC Managed Care – PPO | Admitting: Occupational Therapy

## 2021-01-02 DIAGNOSIS — M6281 Muscle weakness (generalized): Secondary | ICD-10-CM

## 2021-01-02 DIAGNOSIS — R600 Localized edema: Secondary | ICD-10-CM

## 2021-01-02 DIAGNOSIS — M25642 Stiffness of left hand, not elsewhere classified: Secondary | ICD-10-CM

## 2021-01-02 DIAGNOSIS — M25612 Stiffness of left shoulder, not elsewhere classified: Secondary | ICD-10-CM

## 2021-01-02 DIAGNOSIS — M25632 Stiffness of left wrist, not elsewhere classified: Secondary | ICD-10-CM

## 2021-01-02 DIAGNOSIS — M25622 Stiffness of left elbow, not elsewhere classified: Secondary | ICD-10-CM

## 2021-01-02 NOTE — Therapy (Signed)
[facility] [address], [place], [address] Phone: [phone]   Fax:  [phone]  Occupational Therapy Treatment  Patient Details  Name: Collin Brown MRN: [id number] Date of Birth: [birth year] Referring Provider (OT): Dr Vladimir Creeks,   Encounter Date: 01/02/2021   OT End of Session - 01/02/21 1745    Visit Number 2    Number of Visits 24    Date for OT Re-Evaluation 03/25/21    OT Start Time 1525    OT Stop Time 1620    OT Time Calculation (min) 55 min    Activity Tolerance Patient tolerated treatment well    Behavior During Therapy [facility] for tasks assessed/performed           Past Medical History:  Diagnosis Date  . Acute blood loss anemia   . Critical illness myopathy   . Diabetes mellitus without complication (HCC)   . Dyspnea 08/23/2020  . Hearing loss in right ear   . Hypertension   . Pneumonia due to COVID-19 virus 05/04/2020  . Sleep apnea     Past Surgical History:  Procedure Laterality Date  . TONSILLECTOMY    . WOUND DEBRIDEMENT N/A 06/29/2020   Procedure: DEBRIDEMENT SACRAL WOUND;  Surgeon: Axel Filler, MD;  Location: [facility] OR;  Service: General;  Laterality: N/A;    There were no vitals filed for this visit.   Subjective Assessment - 01/02/21 1742    Subjective  I done my exercises and my hand and arm did not hurt as bad with the compression on and I can see more motion in my fingers , and rotation - I can also keep my arm out to side wtih bending my elbow    Pertinent History Collin Brown is a 35 y.o. male who unfortunately contracted COVID in September 2021. He was ventilated for prolonged period of time and went in recovery notice he had bilateral arm weakness. His right upper extremity has since resolved with exception of some residual numbness and tingling in his fourth and fifth digit however he has noticed little improvement to his left upper extremity. He has had  increased shoulder and elbow movement over the last couple of months, but still overall little improvement compared to original baseline. He has been undergoing physical and occupational therapy and his pain has decreased over time. Detailed physical examination is above. However patient does have dependent edema in his left hand as well as a significant left wrist and fingers contracture. He is tender to palpation the proximal aspect of his left arm and does have anhidrosis noted with significant amount of dry skin on his left forearm. 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 C7-T1 is working, but very difficult to assess due to severe contractures and frozen joints. Would like the patient to undergo evaluation from orthopedic hand surgeon. At this point his bicep strength would be very hard to beat with a nerve transfer for elbow flexion, his main limitation in this regard is an elbow extension contracture. However possible consideration for radial to axillary nerve transfer and spinal accessory to suprascapular nerve transfer for increased movement of his shoulder depending on his progress. Potential options were discussed with the patient and his mother. We would like patient to see occupational therapist who specializes in brachial plexus injuries. We would like to see him back in  clinic in approximately 2 months for reevaluation and progress. He was encouraged to reach out to [place] in the meantime for any questions or concerns.    Patient Stated Goals Want to be able to use my L hand to bath, lift or grip , play games, put my belt on , pick up boxes and do my job    Currently in Pain? Yes    Pain Location Arm    Pain Orientation Left    Pain Descriptors / Indicators Aching;Tender;Tightness;Numbness    Pain Type Acute pain    Pain Onset More than a month ago    Pain Frequency Intermittent               [facility] OT Assessment - 01/02/21 0001      AROM   Left Forearm Supination 50 Degrees   AAROM 60   Left Wrist Extension -15 Degrees   place hold     Left Hand AROM   L Thumb MCP 0-60 40 Degrees    L Thumb IP 0-80 65 Degrees    L Thumb Radial ADduction/ABduction 0-55 45    L Thumb Palmar ADduction/ABduction 0-45 55    L Thumb Opposition to Index --   Opposition to 2nd digit   L Index  MCP 0-90 35 Degrees    L Index PIP 0-100 65 Degrees    L Long  MCP 0-90 40 Degrees    L Long PIP 0-100 50 Degrees    L Ring  MCP 0-90 50 Degrees    L Ring PIP 0-100 40 Degrees    L Little  MCP 0-90 30 Degrees    L Little PIP 0-100 40 Degrees              pt in 2 days of doing HEP show increase wrist , forearm and digits AROM - decrease pain with wearing compression during day at work       OT Treatments/Exercises (OP) - 01/02/21 0001      LUE Contrast Bath   Time 8 minutes    Comments prior to soft tissue and AROM to digits and forearm           Review with pt and mom HEP last time- but done this date soft tissue mobs to [facility] and [facility] - and webspace  As well as joint mobs to MC's - and prior to AAROM of thumb PA and RA   Contrast 2-3 x day to cont  Compression glove most all the time -tubigrip E hand to elbow - new on provided   fitted with knuckle bender splint for 2 x 2 min - 4 bands on radial side and 2 on ulnar side - prior to  [facility] for MC flexion , intrinsic fist  Thumb PA and RA  Composite flexion of thumb  10 reps  This date could do opposition to 2nd digit   Wrist AAROM RD, UD , sup and wrist flexion and extnetion - 10 reps  Done this  Date AAROM for supination and PROM - prior to 1 lbs foot ball for pt to do supination and finish range - 10 reps   Elbow flexion at side -and  In ext rotation with elbow to side 10 reps this date  Ed and done pulley's and provided for home - 15 reps for flexion , 10 for ABD fatigue in forearm - pull under arm in axilla with both -  provided pt with pulley's  Shoulder flexion - on  ball rolling on table 20 reps - add to HEP          OT Education - 01/02/21 1744    Education Details progress and HEP review and changes    Person(s) Educated Patient    Methods Explanation;Demonstration;Tactile cues;Verbal cues;Handout    Comprehension Verbal cues required;Returned demonstration;Verbalized understanding            OT Short Term Goals - 12/31/20 2024      OT SHORT TERM GOAL #1   Title Pt and mom to be independent in HEP to decrease edema and increase AROM in forearm to digits    Baseline no lknowledge- edema increase digits to wrist , AROM decrease in wrist , sup , digits flexion and thumb - see lfowsheet    Time 4    Period Weeks    Status New    Target Date 01/28/21             OT Long Term Goals - 12/31/20 2026      OT LONG TERM GOAL #1   Title L wrist AROM increase by 15-20 degrees for sup, extention, flexion , UD to carry or hold objects on palm and push up from chair    Baseline wrist -25 ext, flexion 52, UD 5, sup 45    Time 6    Period Weeks    Status New    Target Date 02/11/21      OT LONG TERM GOAL #2   Title L digits flexion increase by more than 20 degrees in MC and PIP to hold 2 inch cylinder objects in ADL's and at work    Baseline MC flexion 25-30 degrees, PIP's 30-45 degrees - no grip    Time 6    Period Weeks    Status New    Target Date 02/11/21      OT LONG TERM GOAL #3   Title L digits AROM increase for pt to touch palm to use in 25% of ADL's    Baseline see goal 2 for base line - no grip -and no using hand in any ADL's    Time 12    Period Weeks    Status New    Target Date 03/25/21      OT LONG TERM GOAL #4   Title update HEP for strengthening and proximal ROM as progress    Time 6    Period Weeks    Status New    Target Date 02/11/21                 Plan - 01/02/21 1745    Clinical Impression Statement Collin Brown has likely suffered from an upper  trunk brachial plexus injury. C7-T1 during his COVID that he was hospitalize for from Sept thru Dec - and then had [facility] -and return back to work about 4 wks ago - but not able to use his L UE - pt present at eval earlier this week  with very little shoulder AROM , elbow flexion to chest 2+/5, extention 3+/5, but increase edema and flexor contractures in forearm sup, wrist in all planes and digits ROM - only about 25-30 degrees of AROM at MC's and PIP's 30-45 flexoin, thumb decrease in all planes - wrist decrease and supination in all planes - unable to use L hand and arm in ADL's or IADL's - pain increase with ROM to 6-7/10 , edema mostly increase in digis to wrist ,  Pt this date with  decrease edema in digits, decrease pain during day in hand and forearm with compressio wearing - show increase ROM in digits, wrist - and initiated this date pulley's for home use for shoulder - AAROM on ball for shoulder flexion - just pull in axiall - pt very motivated - Pt can benefit from OT services to decrease edema, stiffness and pain -and increase ROM and strength .    OT Occupational Profile and History Problem Focused Assessment - Including review of records relating to presenting problem    Occupational performance deficits (Please refer to evaluation for details): ADL's;IADL's;Work;Play;Leisure;Social Participation    Body Structure / Function / Physical Skills ADL;Coordination;Flexibility;Edema;Dexterity;IADL;UE functional use;Strength;Decreased knowledge of use of DME;FMC;Pain    Rehab Potential Fair    Clinical Decision Making Several treatment options, min-mod task modification necessary    Comorbidities Affecting Occupational Performance: May have comorbidities impacting occupational performance    Modification or Assistance to Complete Evaluation  Min-Moderate modification of tasks or assist with assess necessary to complete eval    OT Frequency 2x / week    OT Duration 12 weeks    OT Treatment/Interventions  Self-care/ADL training;Paraffin;Moist Heat;Fluidtherapy;Contrast Bath;Therapeutic exercise;Manual Therapy;Patient/family education;Passive range of motion;Splinting;DME and/or AE instruction    Consulted and Agree with Plan of Care Patient           Patient will benefit from skilled therapeutic intervention in order to improve the following deficits and impairments:   Body Structure / Function / Physical Skills: ADL,Coordination,Flexibility,Edema,Dexterity,IADL,UE functional use,Strength,Decreased knowledge of use of DME,FMC,Pain       Visit Diagnosis: Stiffness of left hand, not elsewhere classified  Stiffness of left shoulder, not elsewhere classified  Stiffness of left wrist, not elsewhere classified  Stiffness of left elbow, not elsewhere classified  Localized edema  Muscle weakness (generalized)    Problem List Patient Active Problem List   Diagnosis Date Noted  . Lymphedema of arm 12/11/2020  . 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 Sleeps in sitting position due to orthopnea 10/02/2020  . Obesity with alveolar hypoventilation and body mass index (BMI) of 40 or greater (HCC) 10/02/2020  . Chronic intermittent hypoxia with obstructive sleep apnea 10/02/2020  . Pressure ulcer of sacral region, stage 4 (HCC) 09/05/2020  . Diabetes mellitus (HCC) 09/05/2020  . Dyspnea 08/23/2020  . Drug induced constipation   . Hypokalemia   . 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 Debility 06/08/2020  . Critical illness myopathy   . Supplemental oxygen dependent   . Essential hypertension   . Brachial plexopathy   . Neuropathic pain   . Sleep apnea     Collin Brown, Collin Brown,Collin Brown 01/02/2021, 5:50 PM  [facility] [address] [place], [place], [address] Phone: [phone]   Fax:  [phone]  Name: Collin Brown MRN: [id number] Date of Birth: [birth year]

## 2021-01-03 ENCOUNTER — Encounter (HOSPITAL_BASED_OUTPATIENT_CLINIC_OR_DEPARTMENT_OTHER): Payer: BC Managed Care – PPO | Admitting: Internal Medicine

## 2021-01-03 DIAGNOSIS — Z794 Long term (current) use of insulin: Secondary | ICD-10-CM | POA: Diagnosis not present

## 2021-01-03 DIAGNOSIS — Z833 Family history of diabetes mellitus: Secondary | ICD-10-CM | POA: Diagnosis not present

## 2021-01-03 DIAGNOSIS — I10 Essential (primary) hypertension: Secondary | ICD-10-CM | POA: Diagnosis not present

## 2021-01-03 DIAGNOSIS — G7281 Critical illness myopathy: Secondary | ICD-10-CM | POA: Diagnosis not present

## 2021-01-03 DIAGNOSIS — M8668 Other chronic osteomyelitis, other site: Secondary | ICD-10-CM | POA: Diagnosis not present

## 2021-01-03 DIAGNOSIS — L89154 Pressure ulcer of sacral region, stage 4: Secondary | ICD-10-CM | POA: Diagnosis not present

## 2021-01-03 DIAGNOSIS — E1151 Type 2 diabetes mellitus with diabetic peripheral angiopathy without gangrene: Secondary | ICD-10-CM | POA: Diagnosis not present

## 2021-01-03 DIAGNOSIS — Z8616 Personal history of COVID-19: Secondary | ICD-10-CM | POA: Diagnosis not present

## 2021-01-03 DIAGNOSIS — E11622 Type 2 diabetes mellitus with other skin ulcer: Secondary | ICD-10-CM | POA: Diagnosis present

## 2021-01-03 DIAGNOSIS — Z8249 Family history of ischemic heart disease and other diseases of the circulatory system: Secondary | ICD-10-CM | POA: Diagnosis not present
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
## 2021-01-04 ENCOUNTER — Encounter (HOSPITAL_BASED_OUTPATIENT_CLINIC_OR_DEPARTMENT_OTHER): Payer: BC Managed Care – PPO | Admitting: Internal Medicine

## 2021-01-08 NOTE — Progress Notes (Signed)
SAMBA, CUMBA ([id number]) Visit Report for 01/03/2021 Arrival Information Details Patient Name: Date of Service: Collin Brown, [place] M. 01/03/2021 3:30 PM Medical Record Number: [id number] Patient Account Number: [id number] Date of Birth/Sex: Treating RN: 17-Apr-1986 (35 y.o. [name] Primary Care Shuree Brossart: BA CIGA Theone Stanley Other Clinician: Referring Mustaf Antonacci: Treating Timberlyn Pickford/Extender: Kalman Shan BA CIGA LUPO, A NGELA Weeks in Treatment: 98 Visit Information History Since Last Visit Added or deleted any medications: No Patient Arrived: Ambulatory Any new allergies or adverse reactions: No Arrival Time: 15:34 Had a fall or experienced change in No Accompanied By: mother activities of daily living that may affect Transfer Assistance: None risk of falls: Patient Identification Verified: Yes Signs or symptoms of abuse/neglect since last visito No Secondary Verification Process Completed: Yes Hospitalized since last visit: No Patient Requires Transmission-Based Precautions: No Implantable device outside of the clinic excluding No Patient Has Alerts: No cellular tissue based products placed in the center since last visit: Has Dressing in Place as Prescribed: Yes Brown Present Now: No Electronic Signature(s) Signed: 01/03/2021 5:35:44 PM By: Lorrin Jackson Entered By: Lorrin Jackson on 01/03/2021 15:35:09 -------------------------------------------------------------------------------- Clinic Level of Care Assessment Details Patient Name: Date of Service: Collin Brown, [place] M. 01/03/2021 3:30 PM Medical Record Number: [id number] Patient Account Number: [id number] Date of Birth/Sex: Treating RN: 08-05-1986 (35 y.o. Collin Brown Primary Care [name]: Joselyn Arrow Other Clinician: Referring Rubyann Lingle: Treating [name]/Extender: Kalman Shan BA CIGA LUPO, A NGELA Weeks in Treatment: 20 Clinic Level of Care Assessment Items TOOL 4 Quantity Score X- 1  0 Use when only an EandM is performed on FOLLOW-UP visit ASSESSMENTS - Nursing Assessment / Reassessment X- 1 10 Reassessment of Co-morbidities (includes updates in patient status) X- 1 5 Reassessment of Adherence to Treatment Plan ASSESSMENTS - Wound and Skin A ssessment / Reassessment X - Simple Wound Assessment / Reassessment - one wound 1 5 '[]'  - 0 Complex Wound Assessment / Reassessment - multiple wounds X- 1 10 Dermatologic / Skin Assessment (not related to wound area) ASSESSMENTS - Focused Assessment '[]'  - 0 Circumferential Edema Measurements - multi extremities X- 1 10 Nutritional Assessment / Counseling / Intervention '[]'  - 0 Lower Extremity Assessment (monofilament, tuning fork, pulses) '[]'  - 0 Peripheral Arterial Disease Assessment (using hand held doppler) ASSESSMENTS - Ostomy and/or Continence Assessment and Care '[]'  - 0 Incontinence Assessment and Management '[]'  - 0 Ostomy Care Assessment and Management (repouching, etc.) PROCESS - Coordination of Care X - Simple Patient / Family Education for ongoing care 1 15 '[]'  - 0 Complex (extensive) Patient / Family Education for ongoing care X- 1 10 Staff obtains Programmer, systems, Records, T Results / Process Orders est '[]'  - 0 Staff telephones HHA, Nursing Homes / Clarify orders / etc '[]'  - 0 Routine Transfer to another Facility (non-emergent condition) '[]'  - 0 Routine Hospital Admission (non-emergent condition) '[]'  - 0 New Admissions / Biomedical engineer / Ordering NPWT Apligraf, etc. , '[]'  - 0 Emergency Hospital Admission (emergent condition) X- 1 10 Simple Discharge Coordination '[]'  - 0 Complex (extensive) Discharge Coordination PROCESS - Special Needs '[]'  - 0 Pediatric / Minor Patient Management '[]'  - 0 Isolation Patient Management '[]'  - 0 Hearing / Language / Visual special needs '[]'  - 0 Assessment of Community assistance (transportation, D/C planning, etc.) '[]'  - 0 Additional assistance / Altered mentation '[]'  -  0 Support Surface(s) Assessment (bed, cushion, seat, etc.) INTERVENTIONS - Wound Cleansing / Measurement X - Simple Wound Cleansing - one  wound 1 5 '[]'  - 0 Complex Wound Cleansing - multiple wounds X- 1 5 Wound Imaging (photographs - any number of wounds) '[]'  - 0 Wound Tracing (instead of photographs) X- 1 5 Simple Wound Measurement - one wound '[]'  - 0 Complex Wound Measurement - multiple wounds INTERVENTIONS - Wound Dressings X - Small Wound Dressing one or multiple wounds 1 10 '[]'  - 0 Medium Wound Dressing one or multiple wounds '[]'  - 0 Large Wound Dressing one or multiple wounds '[]'  - 0 Application of Medications - topical '[]'  - 0 Application of Medications - injection INTERVENTIONS - Miscellaneous '[]'  - 0 External ear exam '[]'  - 0 Specimen Collection (cultures, biopsies, blood, body fluids, etc.) '[]'  - 0 Specimen(s) / Culture(s) sent or taken to Lab for analysis '[]'  - 0 Patient Transfer (multiple staff / Civil Service fast streamer / Similar devices) '[]'  - 0 Simple Staple / Suture removal (25 or less) '[]'  - 0 Complex Staple / Suture removal (26 or more) '[]'  - 0 Hypo / Hyperglycemic Management (close monitor of Blood Glucose) '[]'  - 0 Ankle / Brachial Index (ABI) - do not check if billed separately X- 1 5 Vital Signs Has the patient been seen at the hospital within the last three years: Yes Total Score: 105 Level Of Care: New/Established - Level 3 Electronic Signature(s) Signed: 01/03/2021 6:02:39 PM By: Deon Pilling Entered By: Deon Pilling on 01/03/2021 17:26:00 -------------------------------------------------------------------------------- Encounter Discharge Information Details Patient Name: Date of Service: Collin Pain, [name] 01/03/2021 3:30 PM Medical Record Number: [id number] Patient Account Number: [id number] Date of Birth/Sex: Treating RN: [birth year] (35 y.o. [name] Primary Care Joclynn Lumb: Joselyn Arrow Other Clinician: Referring Anatole Apollo: Treating  Abdulrahman Bracey/Extender: Kalman Shan BA CIGA LUPO, A NGELA Weeks in Treatment: 20 Encounter Discharge Information Items Discharge Condition: Stable Ambulatory Status: Ambulatory Discharge Destination: Home Transportation: Private Auto Accompanied By: mother Schedule Follow-up Appointment: Yes Clinical Summary of Care: Patient Declined Electronic Signature(s) Signed: 01/08/2021 5:56:30 PM By: Levan Hurst RN, BSN Entered By: Levan Hurst on 01/03/2021 17:35:54 -------------------------------------------------------------------------------- Lower Extremity Assessment Details Patient Name: Date of Service: Collin Pain, [name] 01/03/2021 3:30 PM Medical Record Number: [id number] Patient Account Number: [id number] Date of Birth/Sex: Treating RN: 18-Mar-1986 (34 y.o. [name] Primary Care Mohd. Derflinger: Joselyn Arrow Other Clinician: Referring Donold Marotto: Treating Yonna Alwin/Extender: Kalman Shan Clarita, A NGELA Weeks in Treatment: 20 Electronic Signature(s) Signed: 01/03/2021 5:35:44 PM By: Lorrin Jackson Entered By: Lorrin Jackson on 01/03/2021 15:35:57 -------------------------------------------------------------------------------- Multi Wound Chart Details Patient Name: Date of Service: Collin Pain, [name] 01/03/2021 3:30 PM Medical Record Number: [id number] Patient Account Number: [id number] Date of Birth/Sex: Treating RN: October 06, 1985 (35 y.o. Collin Brown Primary Care Demitrius Crass: BA CIGA Theone Stanley Other Clinician: Referring Tiny Rietz: Treating Kyleen Villatoro/Extender: Kalman Shan BA CIGA LUPO, A NGELA Weeks in Treatment: 20 Vital Signs Height(in): 70 Pulse(bpm): 88 Weight(lbs): 341 Blood Pressure(mmHg): 130/90 Body Mass Index(BMI): 49 Temperature(F): 97.6 Respiratory Rate(breaths/min): 18 Photos: [N/A:N/A] Sacrum N/A N/A Wound Location: Pressure Injury N/A N/A Wounding Event: Pressure Ulcer N/A N/A Primary Etiology: Anemia, Sleep Apnea,  Hypertension, N/A N/A Comorbid History: Type II Diabetes 05/18/2020 N/A N/A Date Acquired: 20 N/A N/A Weeks of Treatment: Open N/A N/A Wound Status: 0.3x0.3x2.2 N/A N/A Measurements L x W x D (cm) 0.071 N/A N/A A (cm) : rea 0.156 N/A N/A Volume (cm) : 95.00% N/A N/A % Reduction in A rea: 97.60% N/A N/A % Reduction in Volume: Category/Stage IV N/A N/A Classification:  Medium N/A N/A Exudate A mount: Serosanguineous N/A N/A Exudate Type: red, brown N/A N/A Exudate Color: Well defined, not attached N/A N/A Wound Margin: Large (67-100%) N/A N/A Granulation A mount: Red N/A N/A Granulation Quality: None Present (0%) N/A N/A Necrotic A mount: Fat Layer (Subcutaneous Tissue): Yes N/A N/A Exposed Structures: Fascia: No Tendon: No Muscle: No Joint: No Bone: No Medium (34-66%) N/A N/A Epithelialization: Treatment Notes Wound #1 (Sacrum) Cleanser Normal Saline Discharge Instruction: Cleanse the wound with Normal Saline prior to applying a clean dressing using gauze sponges, not tissue or cotton balls. Peri-Wound Care Zinc Oxide Ointment 30g tube Discharge Instruction: Apply Zinc Oxide to periwound as needed for moisture with each dressing change Topical Primary Dressing Iodoform packing strip 1/4 (in) Discharge Instruction: Lightly pack as instructed Secondary Dressing Zetuvit Plus Silicone Border Dressing 7x7(in/in) Discharge Instruction: Apply silicone border over primary dressing as directed. Secured With Compression Wrap Compression Stockings Environmental education officer) Signed: 01/03/2021 5:52:25 PM By: Kalman Shan DO Signed: 01/03/2021 6:02:39 PM By: Deon Pilling Entered By: Kalman Shan on 01/03/2021 17:48:52 -------------------------------------------------------------------------------- Multi-Disciplinary Care Plan Details Patient Name: Date of Service: Collin Pain, [name] 01/03/2021 3:30 PM Medical Record Number: [id number] Patient  Account Number: [id number] Date of Birth/Sex: Treating RN: 03/15/1986 (35 y.o. Collin Brown Primary Care Kraig Genis: BA CIGA Theone Stanley Other Clinician: Referring Tarek Cravens: Treating Marigene Erler/Extender: Kalman Shan BA CIGA LUPO, A NGELA Weeks in Treatment: [place] reviewed with physician Active Inactive Nutrition Nursing Diagnoses: Potential for alteratiion in Nutrition/Potential for imbalanced nutrition Goals: Patient/caregiver agrees to and verbalizes understanding of need to obtain nutritional consultation Date Initiated: 08/16/2020 Date Inactivated: 09/14/2020 Target Resolution Date: 09/14/2020 Goal Status: Met Patient/caregiver verbalizes understanding of need to maintain therapeutic glucose control per primary care physician Date Initiated: 08/16/2020 Target Resolution Date: 02/08/2021 Goal Status: Active Interventions: Assess HgA1c results as ordered upon admission and as needed Provide education on elevated blood sugars and impact on wound healing Provide education on nutrition Treatment Activities: Obtain HgA1c : 08/16/2020 Patient referred to Primary Care Physician for further nutritional evaluation : 08/16/2020 Notes: 12/07/20: Glucose control ongoing, target date extended. Pressure Nursing Diagnoses: Knowledge deficit related to management of pressures ulcers Potential for impaired tissue integrity related to pressure, friction, moisture, and shear Goals: Patient will remain free from development of additional pressure ulcers Date Initiated: 08/16/2020 Date Inactivated: 09/14/2020 Target Resolution Date: 09/14/2020 Goal Status: Met Patient/caregiver will verbalize understanding of pressure ulcer management Date Initiated: 08/16/2020 Target Resolution Date: 02/08/2021 Goal Status: Active Interventions: Assess: immobility, friction, shearing, incontinence upon admission and as needed Assess potential for pressure ulcer upon admission and as  needed Provide education on pressure ulcers Treatment Activities: Consult for HBO : 08/16/2020 Pressure reduction/relief device ordered : 08/16/2020 T ordered outside of clinic : 08/16/2020 est Notes: Electronic Signature(s) Signed: 01/03/2021 6:02:39 PM By: Deon Pilling Entered By: Deon Pilling on 01/03/2021 16:01:58 -------------------------------------------------------------------------------- Brown Assessment Details Patient Name: Date of Service: Collin Pain, [name] 01/03/2021 3:30 PM Medical Record Number: [id number] Patient Account Number: [id number] Date of Birth/Sex: Treating RN: [birth year] (35 y.o. [name] Primary Care Rosealynn Mateus: BA CIGA Theone Stanley Other Clinician: Referring Erum Cercone: Treating Armeda Plumb/Extender: Kalman Shan BA CIGA LUPO, A NGELA Weeks in Treatment: 20 Active Problems Location of Brown Severity and Description of Brown Patient Has Paino No Site Locations Brown Management and Medication Current Brown Management: Electronic Signature(s) Signed: 01/03/2021 5:35:44 PM By: Lorrin Jackson Entered By: Lorrin Jackson on 01/03/2021 15:35:48 -------------------------------------------------------------------------------- Patient/Caregiver  Education Details Patient Name: Date of Service: Collin Brown, [place] 5/26/2022andnbsp3:30 PM Medical Record Number: [id number] Patient Account Number: [id number] Date of Birth/Gender: Treating RN: 1986-03-10 (35 y.o. Collin Brown Primary Care Physician: Joselyn Arrow Other Clinician: Referring Physician: Treating Physician/Extender: Kalman Shan BA CIGA LUPO, A NGELA Weeks in Treatment: 20 Education Assessment Education Provided To: Patient and Caregiver Education Topics Provided Elevated Blood Sugar/ Impact on Healing: Handouts: Elevated Blood Sugars: How Do They Affect Wound Healing Methods: Explain/Verbal Responses: Reinforcements needed Electronic Signature(s) Signed: 01/03/2021 6:02:39  PM By: Deon Pilling Entered By: Deon Pilling on 01/03/2021 16:03:02 -------------------------------------------------------------------------------- Wound Assessment Details Patient Name: Date of Service: Collin Pain, [name] 01/03/2021 3:30 PM Medical Record Number: [id number] Patient Account Number: [id number] Date of Birth/Sex: Treating RN: Aug 21, 1985 (35 y.o. [name] Primary Care Shley Dolby: Joselyn Arrow Other Clinician: Referring Ashleymarie Granderson: Treating Dontrae Morini/Extender: Kalman Shan BA CIGA LUPO, A NGELA Weeks in Treatment: 20 Wound Status Wound Number: 1 Primary Etiology: Pressure Ulcer Wound Location: Sacrum Wound Status: Open Wounding Event: Pressure Injury Comorbid History: Anemia, Sleep Apnea, Hypertension, Type II Diabetes Date Acquired: 05/18/2020 Weeks Of Treatment: 20 Clustered Wound: No Photos Wound Measurements Length: (cm) 0.3 Width: (cm) 0.3 Depth: (cm) 2.2 Area: (cm) 0.071 Volume: (cm) 0.156 % Reduction in Area: 95% % Reduction in Volume: 97.6% Epithelialization: Medium (34-66%) Tunneling: No Undermining: No Wound Description Classification: Category/Stage IV Wound Margin: Well defined, not attached Exudate Amount: Medium Exudate Type: Serosanguineous Exudate Color: red, brown Foul Odor After Cleansing: No Slough/Fibrino No Wound Bed Granulation Amount: Large (67-100%) Exposed Structure Granulation Quality: Red Fascia Exposed: No Necrotic Amount: None Present (0%) Fat Layer (Subcutaneous Tissue) Exposed: Yes Tendon Exposed: No Muscle Exposed: No Joint Exposed: No Bone Exposed: No Treatment Notes Wound #1 (Sacrum) Cleanser Normal Saline Discharge Instruction: Cleanse the wound with Normal Saline prior to applying a clean dressing using gauze sponges, not tissue or cotton balls. Peri-Wound Care Zinc Oxide Ointment 30g tube Discharge Instruction: Apply Zinc Oxide to periwound as needed for moisture with each dressing  change Topical Primary Dressing Iodoform packing strip 1/4 (in) Discharge Instruction: Lightly pack as instructed Secondary Dressing Zetuvit Plus Silicone Border Dressing 7x7(in/in) Discharge Instruction: Apply silicone border over primary dressing as directed. Secured With Compression Wrap Compression Stockings Environmental education officer) Signed: 01/03/2021 4:57:49 PM By: Sandre Kitty Signed: 01/03/2021 5:35:44 PM By: Lorrin Jackson Entered By: Sandre Kitty on 01/03/2021 16:45:12 -------------------------------------------------------------------------------- Vitals Details Patient Name: Date of Service: Collin Pain, [name] 01/03/2021 3:30 PM Medical Record Number: [id number] Patient Account Number: [id number] Date of Birth/Sex: Treating RN: [birth year] (35 y.o. [name] Primary Care Benedicto Capozzi: Joselyn Arrow Other Clinician: Referring Nusaybah Ivie: Treating Ardean Simonich/Extender: Kalman Shan BA CIGA LUPO, A NGELA Weeks in Treatment: 20 Vital Signs Time Taken: 15:35 Temperature (F): 97.6 Height (in): 70 Pulse (bpm): 88 Weight (lbs): 341 Respiratory Rate (breaths/min): 18 Body Mass Index (BMI): 48.9 Blood Pressure (mmHg): 130/90 Reference Range: 80 - 120 mg / dl Electronic Signature(s) Signed: 01/03/2021 5:35:44 PM By: Lorrin Jackson Entered By: Lorrin Jackson on 01/03/2021 15:35:41

## 2021-01-11 ENCOUNTER — Ambulatory Visit: Payer: BC Managed Care – PPO | Attending: Family Medicine | Admitting: Occupational Therapy

## 2021-01-11 ENCOUNTER — Other Ambulatory Visit: Payer: Self-pay

## 2021-01-11 DIAGNOSIS — M25632 Stiffness of left wrist, not elsewhere classified: Secondary | ICD-10-CM | POA: Diagnosis present

## 2021-01-11 DIAGNOSIS — M25622 Stiffness of left elbow, not elsewhere classified: Secondary | ICD-10-CM | POA: Diagnosis present

## 2021-01-11 DIAGNOSIS — M25612 Stiffness of left shoulder, not elsewhere classified: Secondary | ICD-10-CM | POA: Insufficient documentation

## 2021-01-11 DIAGNOSIS — R600 Localized edema: Secondary | ICD-10-CM

## 2021-01-11 DIAGNOSIS — M6281 Muscle weakness (generalized): Secondary | ICD-10-CM | POA: Insufficient documentation

## 2021-01-11 DIAGNOSIS — M25642 Stiffness of left hand, not elsewhere classified: Secondary | ICD-10-CM

## 2021-01-11 NOTE — Therapy (Signed)
[place] [facility] PH[facility] MEDICINE [address] [place], [address] Phone: [phone]   Fax:  [phone]  Occupational Therapy Treatment  Patient Details  Name: Collin Brown MRN: [id number] Date of Birth: 11/07/1985 Referring Provider (OT): Dr Vladimir Creeks,   Encounter Date: 01/11/2021   OT End of Session - 01/11/21 1155    Visit Number 3    Number of Visits 24    Date for OT Re-Evaluation 03/25/21    OT Start Time 0926    OT Stop Time 1010    OT Time Calculation (min) 44 min    Activity Tolerance Patient tolerated treatment well    Behavior During Therapy [facility] for tasks assessed/performed           Past Medical History:  Diagnosis Date  . Acute blood loss anemia   . Critical illness myopathy   . Diabetes mellitus without complication (HCC)   . Dyspnea 08/23/2020  . Hearing loss in right ear   . Hypertension   . Pneumonia due to COVID-19 virus 05/04/2020  . Sleep apnea     Past Surgical History:  Procedure Laterality Date  . TONSILLECTOMY    . WOUND DEBRIDEMENT N/A 06/29/2020   Procedure: DEBRIDEMENT SACRAL WOUND;  Surgeon: Axel Filler, MD;  Location: [facility] OR;  Service: General;  Laterality: N/A;    There were no vitals filed for this visit.   Subjective Assessment - 01/11/21 1154    Subjective  Doing okay - work just very stressfull - but I am able to play some games with my brother - gameboy for about hour - using my hand /thumb - fingers are just slow - cannot bend them good-and the pulley's cause some pain for the sideways one    Pertinent History Collin Brown is a 35 y.o. male who unfortunately contracted COVID in September 2021. He was ventilated for prolonged period of time and went in recovery notice he had bilateral arm weakness. His right upper extremity has since resolved with exception of some residual numbness and tingling in his fourth and fifth digit however he has noticed little improvement  to his left upper extremity. He has had increased shoulder and elbow movement over the last couple of months, but still overall little improvement compared to original baseline. He has been undergoing physical and occupational therapy and his pain has decreased over time. Detailed physical examination is above. However patient does have dependent edema in his left hand as well as a significant left wrist and fingers contracture. He is tender to palpation the proximal aspect of his left arm and does have anhidrosis noted with significant amount of dry skin on his left forearm. 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 C7-T1 is working, but very difficult to assess due to severe contractures and frozen joints. Would like the patient to undergo evaluation from orthopedic hand surgeon. At this point his bicep strength would be very hard to beat with a nerve transfer for elbow flexion, his main limitation in this regard is an elbow extension contracture. However possible consideration for radial to axillary nerve transfer and spinal accessory to suprascapular nerve transfer for increased movement of his shoulder depending on his progress. Potential options were discussed with the patient and his mother. We would like patient to see occupational therapist who specializes in brachial plexus injuries. We would like to  see him back in clinic in approximately 2 months for reevaluation and progress. He was encouraged to reach out to [place] in the meantime for any questions or concerns.    Patient Stated Goals Want to be able to use my L hand to bath, lift or grip , play games, put my belt on , pick up boxes and do my job    Currently in Pain? No/denies   only when PROM - stretches to fingers flexion             [facility] OT Assessment - 01/11/21 0001      AROM   Left Forearm Supination 60 Degrees    Left Wrist Extension  -10 Degrees    Left Wrist Flexion 70 Degrees    Left Wrist Radial Deviation 20 Degrees    Left Wrist Ulnar Deviation 10 Degrees      Left Hand AROM   L Thumb MCP 0-60 45 Degrees    L Thumb IP 0-80 70 Degrees    L Thumb Radial ADduction/ABduction 0-55 50    L Thumb Palmar ADduction/ABduction 0-45 50    L Index  MCP 0-90 35 Degrees    L Index PIP 0-100 70 Degrees    L Long  MCP 0-90 40 Degrees    L Long PIP 0-100 55 Degrees    L Ring  MCP 0-90 40 Degrees    L Ring PIP 0-100 35 Degrees    L Little  MCP 0-90 25 Degrees    L Little PIP 0-100 50 Degrees          Pt coming in after not seen for week about - pt had training at work  Report able to use L hand in playing with brother game boy - for about hour  Assess all AROM from hand to shoulder - see flow sheet cont to be tight  in wrist ext, supination , elbow flexion and shoulder AROM Pt ed and review with pt supination combination elbow flexion stretch on table - flexion from trunk with forearm in supination on table -10 reps hold 3-5 sec Followed by AROM and using 1 lbs weight for sup/pro Elbow flexion and extention AROM - hand in neutral  10reps       Contrast 2-3 x day to cont  Compression glove most all the time -tubigrip E hand to elbow - new ones provided    knuckle bender splint for 2 -3 x 2 min - 4 bands on radial side and 4 on ulnar side for slight pull and can do AAROM to digits last 2 min  AAROM for MC flexion , intrinsic fist  And  Composite flexion to cylinder object - larger and smaller one provided - goal to have 4th and 5th touch too by next week  Flexion of digist over edge of table   Thumb PA and RA  AAROM  Composite flexion of thumb  10 reps  This date could do opposition to 2nd digit   Wrist extention - table slides done this date and add to HEP - 10 reps pain free - keeping palm down  Wrist AAROM RD, UD ,wrist flexion and extnetion - 10 reps - 1 lbs foot ball for pt to do supination and finish  range - 10 reps   pulley's  Done last time and provided for home - 15 reps for flexion  BUT hold off on ABD- pt report pain with it   Shoulder flexion - on ball rolling  on table 20 reps - hand in neurtral - pain free and pt to also do scaption - at 45 degrees angle out on table - forearm in neutral - 10 reps  add to HEP                 OT Education - 01/11/21 1155    Education Details progress and HEP review and changes    Person(s) Educated Patient    Methods Explanation;Demonstration;Tactile cues;Verbal cues;Handout    Comprehension Verbal cues required;Returned demonstration;Verbalized understanding            OT Short Term Goals - 12/31/20 2024      OT SHORT TERM GOAL #1   Title Pt and mom to be independent in HEP to decrease edema and increase AROM in forearm to digits    Baseline no lknowledge- edema increase digits to wrist , AROM decrease in wrist , sup , digits flexion and thumb - see lfowsheet    Time 4    Period Weeks    Status New    Target Date 01/28/21             OT Long Term Goals - 12/31/20 2026      OT LONG TERM GOAL #1   Title L wrist AROM increase by 15-20 degrees for sup, extention, flexion , UD to carry or hold objects on palm and push up from chair    Baseline wrist -25 ext, flexion 52, UD 5, sup 45    Time 6    Period Weeks    Status New    Target Date 02/11/21      OT LONG TERM GOAL #2   Title L digits flexion increase by more than 20 degrees in MC and PIP to hold 2 inch cylinder objects in ADL's and at work    Baseline MC flexion 25-30 degrees, PIP's 30-45 degrees - no grip    Time 6    Period Weeks    Status New    Target Date 02/11/21      OT LONG TERM GOAL #3   Title L digits AROM increase for pt to touch palm to use in 25% of ADL's    Baseline see goal 2 for base line - no grip -and no using hand in any ADL's    Time 12    Period Weeks    Status New    Target Date 03/25/21      OT LONG TERM GOAL #4   Title update  HEP for strengthening and proximal ROM as progress    Time 6    Period Weeks    Status New    Target Date 02/11/21                 Plan - 01/11/21 1156    Clinical Impression Statement Collin 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 C7-T1 during his COVID that he was hospitalize for from Sept thru Dec - [facility]and return back to work about 6 wks ago - but not able to use his L UE - pt present at OT eval  with very little shoulder AROM , elbow flexion to chest 2+/5, extention 3+/5, but increase edema and flexor contractures in forearm sup, wrist in all planes and digits ROM - only about 25-30 degrees of AROM at [facility] and PIP's 30-45 flexoin, thumb decrease in all planes - wrist decrease and supination in all planes - unable  to use L hand and arm in ADL's or IADL's - pain increase with ROM to 6-7/10 , edema mostly increase in digis to wrist ,  Pt this date with  decrease pain during day in hand and forearm with compression wearing - show increase ROM in digits, wrist and forearm-  and report using hand in game more - Had some pain wtih pulley's use for shoulder ABD- pt to hold off and only do flexion , add some AAROM for shoulder flexion and scaption on table - did add PROM for supination , elbow flexion and wrist extention - increase tension on knuckle bender splint for MC flexion followed by AAROM and AROM to cylinder object l - pt very motivated - Pt can benefit from OT services to decrease edema, stiffness and pain -and increase ROM and strength .    OT Occupational Profile and History Problem Focused Assessment - Including review of records relating to presenting problem    Occupational performance deficits (Please refer to evaluation for details): ADL's;IADL's;Work;Play;Leisure;Social Participation    Body Structure / Function / Physical Skills ADL;Coordination;Flexibility;Edema;Dexterity;IADL;UE functional use;Strength;Decreased knowledge of use of  DME;FMC;Pain    Rehab Potential Fair    Clinical Decision Making Several treatment options, min-mod task modification necessary    Comorbidities Affecting Occupational Performance: May have comorbidities impacting occupational performance    Modification or Assistance to Complete Evaluation  Min-Moderate modification of tasks or assist with assess necessary to complete eval    OT Frequency 2x / week    OT Duration 12 weeks    OT Treatment/Interventions Self-care/ADL training;Paraffin;Moist Heat;Fluidtherapy;Contrast Bath;Therapeutic exercise;Manual Therapy;Patient/family education;Passive range of motion;Splinting;DME and/or AE instruction    Consulted and Agree with Plan of Care Patient           Patient will benefit from skilled therapeutic intervention in order to improve the following deficits and impairments:   Body Structure / Function / Physical Skills: ADL,Coordination,Flexibility,Edema,Dexterity,IADL,UE functional use,Strength,Decreased knowledge of use of DME,FMC,Pain       Visit Diagnosis: Stiffness of left hand, not elsewhere classified  Stiffness of left shoulder, not elsewhere classified  Stiffness of left wrist, not elsewhere classified  Stiffness of left elbow, not elsewhere classified  Localized edema  Muscle weakness (generalized)    Problem List Patient Active Problem List   Diagnosis Date Noted  . Lymphedema of arm 12/11/2020  . 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 Sleeps in sitting position due to orthopnea 10/02/2020  . Obesity with alveolar hypoventilation and body mass index (BMI) of 40 or greater (HCC) 10/02/2020  . Chronic intermittent hypoxia with obstructive sleep apnea 10/02/2020  . Pressure ulcer of sacral region, stage 4 (HCC) 09/05/2020  . Diabetes mellitus (HCC) 09/05/2020  . Dyspnea 08/23/2020  . Drug induced constipation   . Hypokalemia   . 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  Debility 06/08/2020  . Critical illness myopathy   . Supplemental oxygen dependent   . Essential hypertension   . Brachial plexopathy   . Neuropathic pain   . Sleep apnea     Collin Brown OTR/L,CLT 01/11/2021, 12:01 PM  [place] [facility] PHYS[facility] SPORTS MEDICINE [address], [place], [address] Phone: [phone]   Fax:  [phone]  Name: Collin Brown MRN: [id number] Date of Birth: Oct 05, 1985

## 2021-01-14 ENCOUNTER — Other Ambulatory Visit: Payer: Self-pay

## 2021-01-14 ENCOUNTER — Ambulatory Visit: Payer: BC Managed Care – PPO | Admitting: Occupational Therapy

## 2021-01-14 DIAGNOSIS — M25612 Stiffness of left shoulder, not elsewhere classified: Secondary | ICD-10-CM

## 2021-01-14 DIAGNOSIS — M25642 Stiffness of left hand, not elsewhere classified: Secondary | ICD-10-CM | POA: Diagnosis not present

## 2021-01-14 DIAGNOSIS — R600 Localized edema: Secondary | ICD-10-CM

## 2021-01-14 DIAGNOSIS — M6281 Muscle weakness (generalized): Secondary | ICD-10-CM

## 2021-01-14 DIAGNOSIS — M25632 Stiffness of left wrist, not elsewhere classified: Secondary | ICD-10-CM

## 2021-01-14 DIAGNOSIS — M25622 Stiffness of left elbow, not elsewhere classified: Secondary | ICD-10-CM

## 2021-01-14 NOTE — Therapy (Signed)
[facility] PHYSICAL AND SPORTS MEDICINE [address] [place], [place], [address] Phone: [phone]   Fax:  [phone]  Occupational Therapy Treatment  Patient Details  Name: Collin Brown MRN: [id number] Date of Birth: 18-Apr-1986 Referring Provider (OT): Dr Vladimir CreeksBacigalopo,   Encounter Date: 01/14/2021   OT End of Session - 01/14/21 0902    Visit Number 4    Number of Visits 24    Date for OT Re-Evaluation 03/25/21    OT Start Time 0901    OT Stop Time 0945    OT Time Calculation (min) 44 min    Activity Tolerance Patient tolerated treatment well    Behavior During Therapy [facility] for tasks assessed/performed           Past Medical History:  Diagnosis Date  . Acute blood loss anemia   . Critical illness myopathy   . Diabetes mellitus without complication (HCC)   . Dyspnea 08/23/2020  . Hearing loss in right ear   . Hypertension   . Pneumonia due to COVID-19 virus 05/04/2020  . Sleep apnea     Past Surgical History:  Procedure Laterality Date  . TONSILLECTOMY    . WOUND DEBRIDEMENT N/A 06/29/2020   Procedure: DEBRIDEMENT SACRAL WOUND;  Surgeon: Axel Filleramirez, Armando, MD;  [facility];  Service: General;  Laterality: N/A;    There were no vitals filed for this visit.   Subjective Assessment - 01/14/21 0902    Subjective  My swelling in my fingers are better- more in my hand still - did the exercises - little in the shoulder pain - did only sleep few hours- worked over time    Pertinent History Collin Brown is a 35 y.o. male who unfortunately contracted COVID in September 2021. He was ventilated for prolonged period of time and went in recovery notice he had bilateral arm weakness. His right upper extremity has since resolved with exception of some residual numbness and tingling in his fourth and fifth digit however he has noticed little improvement to his left upper extremity. He has had increased shoulder and elbow movement over  the last couple of months, but still overall little improvement compared to original baseline. He has been undergoing physical and occupational therapy and his pain has decreased over time. Detailed physical examination is above. However patient does have dependent edema in his left hand as well as a significant left wrist and fingers contracture. He is tender to palpation the proximal aspect of his left arm and does have anhidrosis noted with significant amount of dry skin on his left forearm. 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 C7-T1 is working, but very difficult to assess due to severe contractures and frozen joints. Would like the patient to undergo evaluation from orthopedic hand surgeon. At this point his bicep strength would be very hard to beat with a nerve transfer for elbow flexion, his main limitation in this regard is an elbow extension contracture. However possible consideration for radial to axillary nerve transfer and spinal accessory to suprascapular nerve transfer for increased movement of his shoulder depending on his progress. Potential options were discussed with the patient and his mother. We would like patient to see occupational therapist who specializes in brachial plexus injuries. We would like to see him back in clinic in approximately 2 months for reevaluation and progress. He was encouraged to  reach out to [place] in the meantime for any questions or concerns.    Patient Stated Goals Want to be able to use my L hand to bath, lift or grip , play games, put my belt on , pick up boxes and do my job    Currently in Pain? Yes    Pain Orientation Left    Pain Descriptors / Indicators Aching;Tender    Pain Type Acute pain    Pain Frequency Intermittent                        OT Treatments/Exercises (OP) - 01/14/21 0001      LUE Paraffin   Number Minutes Paraffin 6  Minutes    LUE Paraffin Location Hand    Comments 6 min and afterwards - light acewrap flexion stretch of digits around cylinder objects - light stretch   heat elbow            Report able to use L hand in playing with brother game boy last week - for about hour  Assess all AROM from hand to shoulder last week - see flow sheet cont to be tight  in wrist ext, supination , elbow flexion and shoulder AROM Done and review with pt again supination combination elbow flexion stretch on table - hand in flexion stretch around cylinder object with acewrap and pt can do at home   flexion from trunk with forearm in supination on table -10 reps hold 3-5 sec Elbow flexion increase to 95 degrees  Followed by AROM and using 1 lbs weight for sup/pro Elbow flexion and extention AROM - hand in neutral  10reps     After paraffin - done soft tissue mobs to Collin Brown and CT spreads- webspace stretch  10 reps each  And joint mobs to [facility] 's and traction to PIP's - prior to AROM  MC spreads in combination of wrist extention Compression glove most all the time -tubigrip D fitted this date    Pt to cont with knuckle bender splint for 2 -3 x 2 min - 4 bands on radial side and 4 on ulnar side for slight pull and can do AAROM to digits last 2 min  AAROM for MC flexion , intrinsic fist  And  Composite flexion to cylinder object - larger and smaller one provided -  Was able to touch both cylinder with 5th digit- but not 4th - done acewrap for flexion for stretch - pt to do at home  Able to touch larger cylinder with all digits- but smaller one not yet with 4th - pain  Numbness cont in 5th digit   Flexion of digist over edge of table   Thumb PA and RA  AAROM  Composite flexion of thumb  10 reps This date could do opposition to 2nd digit and can pick up 2 cm object with 2nd and 3rd this date   Wrist extention - table slides done this date and add to HEP - 10 reps pain free - keeping palm down  Wrist AAROM  RD, UD ,wrist flexion and extnetion - 10 reps- 1 lbs foot ball for pt to do supination and finish range - 10 reps   Pulley's cont at home  - 15 reps for flexion  BUT hold off on ABD- pt report pain with it   Shoulder flexion - on ball rolling on table 20 reps - hand in neurtral - pain free and pt to also do  scaption - at 45 degrees angle out on table - forearm in neutral - 10 reps  add to HEP          OT Education - 01/14/21 0902    Education Details progress and HEP review and changes    Person(s) Educated Patient    Methods Explanation;Demonstration;Tactile cues;Verbal cues;Handout    Comprehension Verbal cues required;Returned demonstration;Verbalized understanding            OT Short Term Goals - 12/31/20 2024      OT SHORT TERM GOAL #1   Title Pt and mom to be independent in HEP to decrease edema and increase AROM in forearm to digits    Baseline no lknowledge- edema increase digits to wrist , AROM decrease in wrist , sup , digits flexion and thumb - see lfowsheet    Time 4    Period Weeks    Status New    Target Date 01/28/21             OT Long Term Goals - 12/31/20 2026      OT LONG TERM GOAL #1   Title L wrist AROM increase by 15-20 degrees for sup, extention, flexion , UD to carry or hold objects on palm and push up from chair    Baseline wrist -25 ext, flexion 52, UD 5, sup 45    Time 6    Period Weeks    Status New    Target Date 02/11/21      OT LONG TERM GOAL #2   Title L digits flexion increase by more than 20 degrees in MC and PIP to hold 2 inch cylinder objects in ADL's and at work    Baseline MC flexion 25-30 degrees, PIP's 30-45 degrees - no grip    Time 6    Period Weeks    Status New    Target Date 02/11/21      OT LONG TERM GOAL #3   Title L digits AROM increase for pt to touch palm to use in 25% of ADL's    Baseline see goal 2 for base line - no grip -and no using hand in any ADL's    Time 12    Period Weeks    Status New     Target Date 03/25/21      OT LONG TERM GOAL #4   Title update HEP for strengthening and proximal ROM as progress    Time 6    Period Weeks    Status New    Target Date 02/11/21                 Plan - 01/14/21 2878    Clinical Impression Statement Collin 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 C7-T1 during his COVID that he was hospitalize for from Sept thru Dec - and then had [facility] -and return back to work about 6 wks ago - but not able to use his L UE - pt present at OT eval  with very little shoulder AROM , elbow flexion to chest 2+/5, extention 3+/5, but increase edema and flexor contractures in forearm sup, wrist in all planes and digits ROM - only about 25-30 degrees of AROM at [facility] and PIP's 30-45 flexoin, thumb decrease in all planes last week - wrist decrease and supination in all planes - unable to use L hand and arm in ADL's or IADL's - edema moslty in hand part - digits improving.  Pt  this date with  decrease pain during day in hand and forearm with compression wearing - show increase ROM in digits, wrist and forearm- but showing in session increase digits AROM since last time and could do paraffin this date with wrap flexion stretch around cylinder - increase flexion of elbow to 95 degrees. Had some pain wtih pulley's use for shoulder ABD last week- pt to hold off and only do flexion , add some AAROM for shoulder flexion and scaption on table - did add PROM for supination , elbow flexion and wrist extention - increase tension on knuckle bender splint for MC flexion followed by AAROM and AROM to cylinder object l - pt very motivated - Pt can benefit from OT services to decrease edema, stiffness and pain -and increase ROM and strength .    OT Occupational Profile and History Problem Focused Assessment - Including review of records relating to presenting problem    Occupational performance deficits (Please refer to evaluation for details):  ADL's;IADL's;Work;Play;Leisure;Social Participation    Body Structure / Function / Physical Skills ADL;Coordination;Flexibility;Edema;Dexterity;IADL;UE functional use;Strength;Decreased knowledge of use of DME;FMC;Pain    Clinical Decision Making Several treatment options, min-mod task modification necessary    Comorbidities Affecting Occupational Performance: May have comorbidities impacting occupational performance    Modification or Assistance to Complete Evaluation  Min-Moderate modification of tasks or assist with assess necessary to complete eval    OT Frequency 2x / week    OT Duration 12 weeks    OT Treatment/Interventions Self-care/ADL training;Paraffin;Moist Heat;Fluidtherapy;Contrast Bath;Therapeutic exercise;Manual Therapy;Patient/family education;Passive range of motion;Splinting;DME and/or AE instruction    Consulted and Agree with Plan of Care Patient           Patient will benefit from skilled therapeutic intervention in order to improve the following deficits and impairments:   Body Structure / Function / Physical Skills: ADL,Coordination,Flexibility,Edema,Dexterity,IADL,UE functional use,Strength,Decreased knowledge of use of DME,FMC,Pain       Visit Diagnosis: Stiffness of left hand, not elsewhere classified  Stiffness of left shoulder, not elsewhere classified  Stiffness of left wrist, not elsewhere classified  Stiffness of left elbow, not elsewhere classified  Localized edema  Muscle weakness (generalized)    Problem List Patient Active Problem List   Diagnosis Date Noted  . Lymphedema of arm 12/11/2020  . 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 Sleeps in sitting position due to orthopnea 10/02/2020  . Obesity with alveolar hypoventilation and body mass index (BMI) of 40 or greater (HCC) 10/02/2020  . Chronic intermittent hypoxia with obstructive sleep apnea 10/02/2020  . Pressure ulcer of sacral region, stage 4 (HCC) 09/05/2020  . Diabetes mellitus  (HCC) 09/05/2020  . Dyspnea 08/23/2020  . Drug induced constipation   . Hypokalemia   . 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 Debility 06/08/2020  . Critical illness myopathy   . Supplemental oxygen dependent   . Essential hypertension   . Brachial plexopathy   . Neuropathic pain   . Sleep apnea     Collin Brown OTR/l,CLT 01/14/2021, 12:01 PM  [facility] PHYSICAL AND SPORTS MEDICINE [address] [place], [address] Phone: [phone]   Fax:  [phone]  Name: Collin Brown MRN: [id number] Date of Birth: [birth year]

## 2021-01-17 ENCOUNTER — Other Ambulatory Visit: Payer: Self-pay

## 2021-01-17 ENCOUNTER — Encounter (HOSPITAL_BASED_OUTPATIENT_CLINIC_OR_DEPARTMENT_OTHER): Payer: BC Managed Care – PPO | Attending: Internal Medicine | Admitting: Internal Medicine

## 2021-01-17 DIAGNOSIS — L89154 Pressure ulcer of sacral region, stage 4: Secondary | ICD-10-CM | POA: Diagnosis not present

## 2021-01-17 DIAGNOSIS — E11622 Type 2 diabetes mellitus with other skin ulcer: Secondary | ICD-10-CM | POA: Insufficient documentation

## 2021-01-17 DIAGNOSIS — E1151 Type 2 diabetes mellitus with diabetic peripheral angiopathy without gangrene: Secondary | ICD-10-CM | POA: Insufficient documentation

## 2021-01-17 DIAGNOSIS — Z794 Long term (current) use of insulin: Secondary | ICD-10-CM | POA: Insufficient documentation
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
## 2021-01-18 NOTE — Progress Notes (Signed)
Collin Brown, [name] ([id number]) Visit Report for 01/17/2021 Arrival Information Details Patient Name: Date of Service: Collin Brown, Collin M. 01/17/2021 12:30 PM Medical Record Number: [id number] Patient Account Number: [id number] Date of Birth/Sex: Treating RN: [birth year] (35 y.o. [name] Primary Care Cedrik Heindl: Lavon Paganini Other Clinician: Referring Collin Brown: Treating Kaliopi Blyden/Extender: Darlen Round in Treatment: 67 Visit Information History Since Last Visit Added or deleted any medications: No Patient Arrived: Ambulatory Any new allergies or adverse reactions: No Arrival Time: 12:39 Had a fall or experienced change in No Accompanied By: mother activities of daily living that may affect Transfer Assistance: None risk of falls: Patient Identification Verified: Yes Signs or symptoms of abuse/neglect since last visito No Secondary Verification Process Completed: Yes Hospitalized since last visit: No Patient Requires Transmission-Based Precautions: No Implantable device outside of the clinic excluding No Patient Has Alerts: No cellular tissue based products placed in the center since last visit: Has Dressing in Place as Prescribed: Yes Brown Present Now: No Electronic Signature(s) Signed: 01/17/2021 3:14:03 PM By: Sandre Kitty Entered By: Sandre Kitty on 01/17/2021 12:40:17 -------------------------------------------------------------------------------- Clinic Level of Care Assessment Details Patient Name: Date of Service: Collin Brown, Collin M. 01/17/2021 12:30 PM Medical Record Number: [id number] Patient Account Number: [id number] Date of Birth/Sex: Treating RN: 20-Dec-1985 (35 y.o. Collin Brown Primary Care Florrie Ramires: Lavon Paganini Other Clinician: Referring Andie Mortimer: Treating Collin Brown/Extender: Darlen Round in Treatment: 22 Clinic Level of Care Assessment Items TOOL 4 Quantity Score X- 1 0 Use when  only an EandM is performed on FOLLOW-UP visit ASSESSMENTS - Nursing Assessment / Reassessment X- 1 10 Reassessment of Co-morbidities (includes updates in patient status) X- 1 5 Reassessment of Adherence to Treatment Plan ASSESSMENTS - Wound and Skin A ssessment / Reassessment X - Simple Wound Assessment / Reassessment - one wound 1 5 '[]'  - 0 Complex Wound Assessment / Reassessment - multiple wounds X- 1 10 Dermatologic / Skin Assessment (not related to wound area) ASSESSMENTS - Focused Assessment '[]'  - 0 Circumferential Edema Measurements - multi extremities X- 1 10 Nutritional Assessment / Counseling / Intervention '[]'  - 0 Lower Extremity Assessment (monofilament, tuning fork, pulses) '[]'  - 0 Peripheral Arterial Disease Assessment (using hand held doppler) ASSESSMENTS - Ostomy and/or Continence Assessment and Care '[]'  - 0 Incontinence Assessment and Management '[]'  - 0 Ostomy Care Assessment and Management (repouching, etc.) PROCESS - Coordination of Care X - Simple Patient / Family Education for ongoing care 1 15 '[]'  - 0 Complex (extensive) Patient / Family Education for ongoing care X- 1 10 Staff obtains Consents, Records, T Results / Process Orders est '[]'  - 0 Staff telephones HHA, Nursing Homes / Clarify orders / etc '[]'  - 0 Routine Transfer to another Facility (non-emergent condition) '[]'  - 0 Routine Hospital Admission (non-emergent condition) '[]'  - 0 New Admissions / Biomedical engineer / Ordering NPWT Apligraf, etc. , '[]'  - 0 Emergency Hospital Admission (emergent condition) X- 1 10 Simple Discharge Coordination '[]'  - 0 Complex (extensive) Discharge Coordination PROCESS - Special Needs '[]'  - 0 Pediatric / Minor Patient Management '[]'  - 0 Isolation Patient Management '[]'  - 0 Hearing / Language / Visual special needs '[]'  - 0 Assessment of Community assistance (transportation, D/C planning, etc.) '[]'  - 0 Additional assistance / Altered mentation '[]'  - 0 Support  Surface(s) Assessment (bed, cushion, seat, etc.) INTERVENTIONS - Wound Cleansing / Measurement X - Simple Wound Cleansing - one wound 1 5 '[]'  - 0 Complex Wound Cleansing - multiple wounds  X- 1 5 Wound Imaging (photographs - any number of wounds) '[]'  - 0 Wound Tracing (instead of photographs) X- 1 5 Simple Wound Measurement - one wound '[]'  - 0 Complex Wound Measurement - multiple wounds INTERVENTIONS - Wound Dressings X - Small Wound Dressing one or multiple wounds 1 10 '[]'  - 0 Medium Wound Dressing one or multiple wounds '[]'  - 0 Large Wound Dressing one or multiple wounds '[]'  - 0 Application of Medications - topical '[]'  - 0 Application of Medications - injection INTERVENTIONS - Miscellaneous '[]'  - 0 External ear exam '[]'  - 0 Specimen Collection (cultures, biopsies, blood, body fluids, etc.) '[]'  - 0 Specimen(s) / Culture(s) sent or taken to Lab for analysis '[]'  - 0 Patient Transfer (multiple staff / Civil Service fast streamer / Similar devices) '[]'  - 0 Simple Staple / Suture removal (25 or less) '[]'  - 0 Complex Staple / Suture removal (26 or more) '[]'  - 0 Hypo / Hyperglycemic Management (close monitor of Blood Glucose) '[]'  - 0 Ankle / Brachial Index (ABI) - do not check if billed separately X- 1 5 Vital Signs Has the patient been seen at the hospital within the last three years: Yes Total Score: 105 Level Of Care: New/Established - Level 3 Electronic Signature(s) Signed: 01/17/2021 6:02:04 PM By: Deon Pilling Entered By: Deon Pilling on 01/17/2021 14:28:10 -------------------------------------------------------------------------------- Encounter Discharge Information Details Patient Name: Date of Service: Collin Pain, [name] 01/17/2021 12:30 PM Medical Record Number: [id number] Patient Account Number: [id number] Date of Birth/Sex: Treating RN: [birth year] (35 y.o. [name] Primary Care Ceciley Buist: Lavon Paganini Other Clinician: Referring Carolyne Whitsel: Treating [place]/Extender: Darlen Round in Treatment: 6 Encounter Discharge Information Items Discharge Condition: Stable Ambulatory Status: Ambulatory Discharge Destination: Home Transportation: Private Auto Accompanied By: mother Schedule Follow-up Appointment: Yes Clinical Summary of Care: Patient Declined Electronic Signature(s) Signed: 01/17/2021 5:15:25 PM By: Baruch Gouty RN, BSN Entered By: Baruch Gouty on 01/17/2021 13:27:28 -------------------------------------------------------------------------------- Multi Wound Chart Details Patient Name: Date of Service: Collin Pain, [name] 01/17/2021 12:30 PM Medical Record Number: [id number] Patient Account Number: [id number] Date of Birth/Sex: Treating RN: 11-23-1985 (35 y.o. [name] Primary Care [place]: Lavon Paganini Other Clinician: Referring Kagan Mutchler: Treating Kisha Messman/Extender: Darlen Round in Treatment: 22 Vital Signs Height(in): 70 Pulse(bpm): 43 Weight(lbs): 341 Blood Pressure(mmHg): 145/93 Body Mass Index(BMI): 49 Temperature(F): 98.2 Respiratory Rate(breaths/min): 18 Photos: [1:No Photos Sacrum] [N/A:N/A N/A] Wound Location: [1:Pressure Injury] [N/A:N/A] Wounding Event: [1:Pressure Ulcer] [N/A:N/A] Primary Etiology: [1:Anemia, Sleep Apnea, Hypertension,] [N/A:N/A] Comorbid History: [1:Type II Diabetes 05/18/2020] [N/A:N/A] Date Acquired: [1:22] [N/A:N/A] Weeks of Treatment: [1:Open] [N/A:N/A] Wound Status: [1:0.4x0.3x2.4] [N/A:N/A] Measurements L x W x D (cm) [1:0.094] [N/A:N/A] A (cm) : rea [1:0.226] [N/A:N/A] Volume (cm) : [1:93.40%] [N/A:N/A] % Reduction in A rea: [1:96.50%] [N/A:N/A] % Reduction in Volume: [1:Category/Stage IV] [N/A:N/A] Classification: [1:Medium] [N/A:N/A] Exudate A mount: [1:Serosanguineous] [N/A:N/A] Exudate Type: [1:red, brown] [N/A:N/A] Exudate Color: [1:Well defined, not attached] [N/A:N/A] Wound Margin: [1:Large (67-100%)]  [N/A:N/A] Granulation A mount: [1:Red] [N/A:N/A] Granulation Quality: [1:None Present (0%)] [N/A:N/A] Necrotic A mount: [1:Fat Layer (Subcutaneous Tissue): Yes N/A] Exposed Structures: [1:Fascia: No Tendon: No Muscle: No Joint: No Bone: No Medium (34-66%)] [N/A:N/A] Treatment Notes Electronic Signature(s) Signed: 01/17/2021 4:38:36 PM By: Linton Ham MD Signed: 01/17/2021 5:42:55 PM By: Lorrin Jackson Entered By: Linton Ham on 01/17/2021 13:20:52 -------------------------------------------------------------------------------- Multi-Disciplinary Care Plan Details Patient Name: Date of Service: Collin Pain, [name] 01/17/2021 12:30 PM Medical Record Number: [id number] Patient Account Number: [id number] Date of  Birth/Sex: Treating RN: 28-Sep-1985 (35 y.o. Collin Brown Primary Care Ormand Senn: Lavon Paganini Other Clinician: Referring Marillyn Goren: Treating Ayan Yankey/Extender: Darlen Round in Treatment: 72 Multidisciplinary Care Plan reviewed with physician Active Inactive Nutrition Nursing Diagnoses: Potential for alteratiion in Nutrition/Potential for imbalanced nutrition Goals: Patient/caregiver agrees to and verbalizes understanding of need to obtain nutritional consultation Date Initiated: 08/16/2020 Date Inactivated: 09/14/2020 Target Resolution Date: 09/14/2020 Goal Status: Met Patient/caregiver verbalizes understanding of need to maintain therapeutic glucose control per primary care physician Date Initiated: 08/16/2020 Target Resolution Date: 02/08/2021 Goal Status: Active Interventions: Assess HgA1c results as ordered upon admission and as needed Provide education on elevated blood sugars and impact on wound healing Provide education on nutrition Treatment Activities: Obtain HgA1c : 08/16/2020 Patient referred to Primary Care Physician for further nutritional evaluation : 08/16/2020 Notes: 12/07/20: Glucose control ongoing, target date  extended. Pressure Nursing Diagnoses: Knowledge deficit related to management of pressures ulcers Potential for impaired tissue integrity related to pressure, friction, moisture, and shear Goals: Patient will remain free from development of additional pressure ulcers Date Initiated: 08/16/2020 Date Inactivated: 09/14/2020 Target Resolution Date: 09/14/2020 Goal Status: Met Patient/caregiver will verbalize understanding of pressure ulcer management Date Initiated: 08/16/2020 Target Resolution Date: 02/08/2021 Goal Status: Active Interventions: Assess: immobility, friction, shearing, incontinence upon admission and as needed Assess potential for pressure ulcer upon admission and as needed Provide education on pressure ulcers Treatment Activities: Consult for HBO : 08/16/2020 Pressure reduction/relief device ordered : 08/16/2020 T ordered outside of clinic : 08/16/2020 est Notes: Electronic Signature(s) Signed: 01/17/2021 6:02:04 PM By: Deon Pilling Entered By: Deon Pilling on 01/17/2021 14:27:27 -------------------------------------------------------------------------------- Brown Assessment Details Patient Name: Date of Service: Collin Pain, [name] 01/17/2021 12:30 PM Medical Record Number: [id number] Patient Account Number: [id number] Date of Birth/Sex: Treating RN: [birth year] (35 y.o. [name] Primary Care Mel Tadros: Lavon Paganini Other Clinician: Referring Nikie Cid: Treating Jamilla Galli/Extender: Darlen Round in Treatment: 22 Active Problems Location of Brown Severity and Description of Brown Patient Has Paino No Site Locations Brown Management and Medication Current Brown Management: Electronic Signature(s) Signed: 01/17/2021 3:14:03 PM By: Sandre Kitty Signed: 01/17/2021 5:42:55 PM By: Lorrin Jackson Entered By: Sandre Kitty on 01/17/2021  12:40:45 -------------------------------------------------------------------------------- Patient/Caregiver Education Details Patient Name: Date of Service: Collin Pain, [name] 6/9/2022andnbsp12:30 PM Medical Record Number: [id number] Patient Account Number: [id number] Date of Birth/Gender: Treating RN: 1985-08-31 (35 y.o. Collin Brown Primary Care Physician: Lavon Paganini Other Clinician: Referring Physician: Treating Physician/Extender: Darlen Round in Treatment: 20 Education Assessment Education Provided To: Patient and Caregiver Education Topics Provided Nutrition: Handouts: Nutrition Methods: Explain/Verbal Responses: Reinforcements needed Electronic Signature(s) Signed: 01/17/2021 6:02:04 PM By: Deon Pilling Entered By: Deon Pilling on 01/17/2021 14:27:42 -------------------------------------------------------------------------------- Wound Assessment Details Patient Name: Date of Service: Collin Pain, [name] 01/17/2021 12:30 PM Medical Record Number: [id number] Patient Account Number: [id number] Date of Birth/Sex: Treating RN: [birth year] (35 y.o. [name] Primary Care Caralee Morea: Lavon Paganini Other Clinician: Referring Thurmon Mizell: Treating [place]/Extender: Darlen Round in Treatment: 22 Wound Status Wound Number: 1 Primary Etiology: Pressure Ulcer Wound Location: Sacrum Wound Status: Open Wounding Event: Pressure Injury Comorbid History: Anemia, Sleep Apnea, Hypertension, Type II Diabetes Date Acquired: 05/18/2020 Weeks Of Treatment: 22 Clustered Wound: No Photos Wound Measurements Length: (cm) 0.4 Width: (cm) 0.3 Depth: (cm) 2.4 Area: (cm) 0.094 Volume: (cm) 0.226 % Reduction in Area: 93.4% % Reduction in Volume: 96.5% Epithelialization: Medium (34-66%) Tunneling: No Undermining: No Wound Description  Classification: Category/Stage IV Wound Margin: Well defined, not  attached Exudate Amount: Medium Exudate Type: Serosanguineous Exudate Color: red, brown Foul Odor After Cleansing: No Slough/Fibrino No Wound Bed Granulation Amount: Large (67-100%) Exposed Structure Granulation Quality: Red Fascia Exposed: No Necrotic Amount: None Present (0%) Fat Layer (Subcutaneous Tissue) Exposed: Yes Tendon Exposed: No Muscle Exposed: No Joint Exposed: No Bone Exposed: No Treatment Notes Wound #1 (Sacrum) Cleanser Normal Saline Discharge Instruction: Cleanse the wound with Normal Saline prior to applying a clean dressing using gauze sponges, not tissue or cotton balls. Peri-Wound Care Zinc Oxide Ointment 30g tube Discharge Instruction: Apply Zinc Oxide to periwound as needed for moisture with each dressing change Topical Primary Dressing Iodoform packing strip 1/4 (in) Discharge Instruction: Lightly pack as instructed Secondary Dressing Allevyn Adhesive Foam Sacrum Dressing, 6.75x6.75 (in/in) Discharge Instruction: Apply over primary dressing. Secured With Compression Wrap Compression Stockings Environmental education officer) Signed: 01/17/2021 6:15:37 PM By: Levan Hurst RN, BSN Signed: 01/18/2021 3:50:24 PM By: Sandre Kitty Entered By: Sandre Kitty on 01/17/2021 16:52:21 -------------------------------------------------------------------------------- Vitals Details Patient Name: Date of Service: Collin Pain, [name] 01/17/2021 12:30 PM Medical Record Number: [id number] Patient Account Number: [id number] Date of Birth/Sex: Treating RN: September 08, 1985 (35 y.o. [name] Primary Care Demitrious Mccannon: Lavon Paganini Other Clinician: Referring Garnetta Fedrick: Treating Tarance Balan/Extender: Darlen Round in Treatment: 22 Vital Signs Time Taken: 12:40 Temperature (F): 98.2 Height (in): 70 Pulse (bpm): 84 Weight (lbs): 341 Respiratory Rate (breaths/min): 18 Body Mass Index (BMI): 48.9 Blood Pressure (mmHg):  145/93 Reference Range: 80 - 120 mg / dl Electronic Signature(s) Signed: 01/17/2021 3:14:03 PM By: Sandre Kitty Entered By: Sandre Kitty on 01/17/2021 12:40:34

## 2021-01-21 ENCOUNTER — Ambulatory Visit: Payer: BC Managed Care – PPO | Admitting: Occupational Therapy

## 2021-01-21 ENCOUNTER — Other Ambulatory Visit: Payer: Self-pay

## 2021-01-21 DIAGNOSIS — M25632 Stiffness of left wrist, not elsewhere classified: Secondary | ICD-10-CM

## 2021-01-21 DIAGNOSIS — R600 Localized edema: Secondary | ICD-10-CM

## 2021-01-21 DIAGNOSIS — M6281 Muscle weakness (generalized): Secondary | ICD-10-CM

## 2021-01-21 DIAGNOSIS — M25622 Stiffness of left elbow, not elsewhere classified: Secondary | ICD-10-CM

## 2021-01-21 DIAGNOSIS — M25642 Stiffness of left hand, not elsewhere classified: Secondary | ICD-10-CM | POA: Diagnosis not present

## 2021-01-21 DIAGNOSIS — M25612 Stiffness of left shoulder, not elsewhere classified: Secondary | ICD-10-CM

## 2021-01-21 NOTE — Therapy (Signed)
[facility] [address] [place], [address] Phone: [phone]   Fax:  [phone]  Occupational Therapy Treatment  Patient Details  Name: Collin Brown MRN: [id number] Date of Birth: 05-Nov-1985 Referring Provider (OT): Dr Vladimir Creeks,   Encounter Date: 01/21/2021   OT End of Session - 01/21/21 1437     Visit Number 5    Number of Visits 24    Date for OT Re-Evaluation 03/25/21    OT Start Time 1433    OT Stop Time 1523    OT Time Calculation (min) 50 min    Activity Tolerance Patient tolerated treatment well    Behavior During Therapy [facility] for tasks assessed/performed             Past Medical History:  Diagnosis Date   Acute blood loss anemia    Critical illness myopathy    Diabetes mellitus without complication (HCC)    Dyspnea 08/23/2020   Hearing loss in right ear    Hypertension    Pneumonia due to COVID-19 virus 05/04/2020   Sleep apnea     Past Surgical History:  Procedure Laterality Date   TONSILLECTOMY     WOUND DEBRIDEMENT N/A 06/29/2020   Procedure: DEBRIDEMENT SACRAL WOUND;  Surgeon: Axel Filler, MD;  Location: [facility] OR;  Service: General;  Laterality: N/A;    There were no vitals filed for this visit.   Subjective Assessment - 01/21/21 1437     Subjective  I worked so much this past week - I was so tired and shaking the other day and just told them at work I need off days to work on my health and my arm exercises    Pertinent History Collin Brown is a 35 y.o. male who unfortunately contracted COVID in September 2021. He was ventilated for prolonged period of time and went in recovery notice he had bilateral arm weakness. His right upper extremity has since resolved with exception of some residual numbness and tingling in his fourth and fifth digit however he has noticed little improvement to his left upper extremity. He has had increased shoulder and elbow movement  over the last couple of months, but still overall little improvement compared to original baseline. He has been undergoing physical and occupational therapy and his pain has decreased over time. Detailed physical examination is above. However patient does have dependent edema in his left hand as well as a significant left wrist and fingers contracture. He is tender to palpation the proximal aspect of his left arm and does have anhidrosis noted with significant amount of dry skin on his left forearm. 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 C7-T1 is working, but very difficult to assess due to severe contractures and frozen joints. Would like the patient to undergo evaluation from orthopedic hand surgeon. At this point his bicep strength would be very hard to beat with a nerve transfer for elbow flexion, his main limitation in this regard is an elbow extension contracture. However possible consideration for radial to axillary nerve transfer and spinal accessory to suprascapular nerve transfer for increased movement of his shoulder depending on his progress. Potential options were discussed with the patient and his mother. We would like patient to see occupational therapist who specializes in brachial plexus injuries. We would like to see him back in clinic in approximately 2  months for reevaluation and progress. He was encouraged to reach out to [place] in the meantime for any questions or concerns.    Patient Stated Goals Want to be able to use my L hand to bath, lift or grip , play games, put my belt on , pick up boxes and do my job    Currently in Pain? Yes    Pain Score 1     Pain Location Arm    Pain Orientation Left    Pain Descriptors / Indicators Aching;Tender;Tightness    Pain Type Acute pain    Pain Onset More than a month ago    Pain Frequency Intermittent                [facility] OT Assessment -  01/21/21 0001       AROM   Left Forearm Supination 65 Degrees    Left Wrist Extension 5 Degrees      Left Hand AROM   L Index  MCP 0-90 35 Degrees    L Index PIP 0-100 80 Degrees    L Long  MCP 0-90 40 Degrees    L Long PIP 0-100 60 Degrees    L Ring  MCP 0-90 35 Degrees    L Ring PIP 0-100 40 Degrees    L Little  MCP 0-90 25 Degrees    L Little PIP 0-100 50 Degrees            Elbow flexion 100 degrees  Lat grip R 21, L 7lbs 3 point pinch R 15, L 3 lbs         OT Treatments/Exercises (OP) - 01/21/21 0001       Moist Heat Therapy   Number Minutes Moist Heat 5 Minutes    Moist Heat Location --   flexion wrap to digits using acewrap                Assess all AROM from hand to elbow  - see flow sheet cont to be tight  in wrist ext, supination , elbow flexion and shoulder AROM  Pt to cont with  supination combination elbow flexion stretch on table -   flexion from trunk with forearm in supination on table -10 reps hold 3-5 sec Elbow flexion increase to 100 degrees  Followed by AROM and using 1 lbs weight for sup/pro Elbow flexion and extention AROM - hand in neutral 10reps         After heat with ace wrap - done soft tissue mobs to [facility] and CT spreads- webspace stretch 10 reps each And joint mobs to [facility] 's and traction to PIP's - prior to AROM MC spreads in combination of wrist extention Compression glove most all the time -tubigrip D fitted this date       Pt to cont with knuckle bender splint for 2 -3 x 2 min - 4 bands on radial side and 4 on ulnar side for slight pull and can do AAROM to digits last 2 min  AAROM for MC flexion , intrinsic fist  And  Composite flexion to cylinder object - larger and smaller one provided -  Was able to touch both cylinder with 5th digit- but not 4th - done acewrap for flexion for stretch - pt to do at home Able to touch larger cylinder with all digits- but smaller one not yet with 4th - pain Add and pt done gripping blue  med foam block -and pt to do lat and 3  point into it  Pt able to do lat and 3 point pinch now in session - see strength in flowsheet  Numbness cont in 5th digit   Flexion of digist over edge of table    Thumb PA and RA  AAROM  Composite flexion of thumb 10 reps  This date could do opposition to 2nd digit  and can pick up 2 cm object with 2nd and 3rd this date    Wrist extention - table slides done this date and add to HEP - 10 reps pain free - keeping palm down  Wrist AAROM RD, UD ,wrist flexion and extnetion - 10 reps -    Pulley's cont at home  - 15 reps for flexion  BUT hold off on ABD- pt report pain with it  Or AAROM on table - flexion and ABD - stop when feeling pain   Shoulder flexion - on ball rolling on table 20 reps - hand in neurtral - pain free and pt to also do scaption - at 45 degrees angle out on table - forearm in neutral - 10 reps  add to HEP         OT Education - 01/21/21 1437     Education Details progress and HEP review and changes    Person(s) Educated Patient    Methods Explanation;Demonstration;Tactile cues;Verbal cues;Handout    Comprehension Verbal cues required;Returned demonstration;Verbalized understanding              OT Short Term Goals - 12/31/20 2024       OT SHORT TERM GOAL #1   Title Pt and mom to be independent in HEP to decrease edema and increase AROM in forearm to digits    Baseline no lknowledge- edema increase digits to wrist , AROM decrease in wrist , sup , digits flexion and thumb - see lfowsheet    Time 4    Period Weeks    Status New    Target Date 01/28/21               OT Long Term Goals - 12/31/20 2026       OT LONG TERM GOAL #1   Title L wrist AROM increase by 15-20 degrees for sup, extention, flexion , UD to carry or hold objects on palm and push up from chair    Baseline wrist -25 ext, flexion 52, UD 5, sup 45    Time 6    Period Weeks    Status New    Target Date 02/11/21      OT LONG TERM GOAL #2    Title L digits flexion increase by more than 20 degrees in MC and PIP to hold 2 inch cylinder objects in ADL's and at work    Baseline MC flexion 25-30 degrees, PIP's 30-45 degrees - no grip    Time 6    Period Weeks    Status New    Target Date 02/11/21      OT LONG TERM GOAL #3   Title L digits AROM increase for pt to touch palm to use in 25% of ADL's    Baseline see goal 2 for base line - no grip -and no using hand in any ADL's    Time 12    Period Weeks    Status New    Target Date 03/25/21      OT LONG TERM GOAL #4   Title update HEP for strengthening and proximal ROM as progress  Time 6    Period Weeks    Status New    Target Date 02/11/21                   Plan - 01/21/21 1438     Clinical Impression Statement Mr. 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 C7-T1 during his COVID that he was hospitalize for from Sept thru Dec - and then had [facility] -and return back to work about 6 wks  - but not able to use his L UE - pt present at OT eval  with very little shoulder AROM , elbow flexion to chest 2+/5, extention 3+/5, but increase edema and flexor contractures in forearm sup, wrist in all planes and digits ROM. Pt making weekly progress in elbow flexion, sup, wrist extenion , digits flexion- slow but steady- 5th digit and ulnar side of hand cont to be numb per pt -and increase pain wtih PROM to 4th digit- edema mostly over [facility] 's and palm. THis date able to assess lat grip at 7 lbs  and 3 point grip at 3 lbs with support . Pt had some pain wtih pulley's use for shoulder ABD  and today when attempted AAROM on table - but tolerate flexion better - but also pain - Pt to hold off on ABD if pain - cont PROM for supination , elbow flexion and wrist extention - increase tension on knuckle bender splint for MC flexion followed by AAROM and AROM to cylinder object  - pt very motivated - Pt can benefit from OT services to decrease edema, stiffness and pain -and  increase ROM and strength .    OT Occupational Profile and History Problem Focused Assessment - Including review of records relating to presenting problem    Occupational performance deficits (Please refer to evaluation for details): ADL's;IADL's;Work;Play;Leisure;Social Participation    Body Structure / Function / Physical Skills ADL;Coordination;Flexibility;Edema;Dexterity;IADL;UE functional use;Strength;Decreased knowledge of use of DME;FMC;Pain    Rehab Potential Fair    Clinical Decision Making Several treatment options, min-mod task modification necessary    Comorbidities Affecting Occupational Performance: May have comorbidities impacting occupational performance    Modification or Assistance to Complete Evaluation  Min-Moderate modification of tasks or assist with assess necessary to complete eval    OT Frequency 2x / week    OT Duration 12 weeks    OT Treatment/Interventions Self-care/ADL training;Paraffin;Moist Heat;Fluidtherapy;Contrast Bath;Therapeutic exercise;Manual Therapy;Patient/family education;Passive range of motion;Splinting;DME and/or AE instruction    Consulted and Agree with Plan of Care Patient             Patient will benefit from skilled therapeutic intervention in order to improve the following deficits and impairments:   Body Structure / Function / Physical Skills: ADL, Coordination, Flexibility, Edema, Dexterity, IADL, UE functional use, Strength, Decreased knowledge of use of DME, FMC, Pain       Visit Diagnosis: Stiffness of left hand, not elsewhere classified  Stiffness of left shoulder, not elsewhere classified  Stiffness of left wrist, not elsewhere classified  Stiffness of left elbow, not elsewhere classified  Localized edema  Muscle weakness (generalized)    Problem List Patient Active Problem List   Diagnosis Date Noted   Lymphedema of arm 12/11/2020   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   Sleeps in sitting position due to orthopnea  10/02/2020   Obesity with alveolar hypoventilation and body mass index (BMI) of 40 or greater (HCC) 10/02/2020   Chronic intermittent hypoxia with obstructive sleep apnea  10/02/2020   Pressure ulcer of sacral region, stage 4 (HCC) 09/05/2020   Diabetes mellitus (HCC) 09/05/2020   Dyspnea 08/23/2020   Drug induced constipation    Hypokalemia    COVID-19 long hauler manifesting chronic decreased mobility and endurance 06/17/2020   Debility 06/08/2020   Critical illness myopathy    Supplemental oxygen dependent    Essential hypertension    Brachial plexopathy    Neuropathic pain    Sleep apnea     Oletta Cohn OTR/L,CLT 01/21/2021, 6:15 PM  [facility] [address], [place], [address] Phone: [phone]   Fax:  [phone]  Name: ALF DOYLE MRN: [id number] Date of Birth: Dec 25, 1985

## 2021-01-24 ENCOUNTER — Other Ambulatory Visit: Payer: Self-pay

## 2021-01-24 ENCOUNTER — Ambulatory Visit: Payer: BC Managed Care – PPO | Admitting: Occupational Therapy

## 2021-01-24 DIAGNOSIS — M25632 Stiffness of left wrist, not elsewhere classified: Secondary | ICD-10-CM

## 2021-01-24 DIAGNOSIS — M25642 Stiffness of left hand, not elsewhere classified: Secondary | ICD-10-CM | POA: Diagnosis not present

## 2021-01-24 DIAGNOSIS — M25622 Stiffness of left elbow, not elsewhere classified: Secondary | ICD-10-CM

## 2021-01-24 DIAGNOSIS — R600 Localized edema: Secondary | ICD-10-CM

## 2021-01-24 DIAGNOSIS — M6281 Muscle weakness (generalized): Secondary | ICD-10-CM

## 2021-01-24 NOTE — Therapy (Signed)
[place] [facility] [address] [place], [place], [address] Phone: [phone]   Fax:  [phone]  Occupational Therapy Treatment  Patient Details  Name: Collin Brown MRN: [id number] Date of Birth: 13-Jul-1986 Referring Provider (OT): Dr Vladimir CreeksBacigalopo,   Encounter Date: 01/24/2021   OT End of Session - 01/24/21 1502     Visit Number 6    Number of Visits 24    Date for OT Re-Evaluation 03/25/21    OT Start Time 1445    OT Stop Time 1530    OT Time Calculation (min) 45 min    Activity Tolerance Patient tolerated treatment well    Behavior [facility] for tasks assessed/performed             Past Medical History:  Diagnosis Date   Acute blood loss anemia    Critical illness myopathy    Diabetes mellitus without complication (HCC)    Dyspnea 08/23/2020   Hearing loss in right ear    Hypertension    Pneumonia due to COVID-19 virus 05/04/2020   Sleep apnea     Past Surgical History:  Procedure Laterality Date   TONSILLECTOMY     WOUND DEBRIDEMENT N/A 06/29/2020   Procedure: DEBRIDEMENT SACRAL WOUND;  Surgeon: Axel Filleramirez, Armando, MD;  [facility];  Service: General;  Laterality: N/A;    There were no vitals filed for this visit.   Subjective Assessment - 01/24/21 1541     Subjective  I am off over the weekend for 2 days -and worked this am since 5 am - so did not had time today to do my exercises- fingers stiff , that ring finger is the one that hurts when bending - pinkie still numb    Pertinent History Collin Brown is a 35 y.o. male who unfortunately contracted COVID in September 2021. He was ventilated for prolonged period of time and went in recovery notice he had bilateral arm weakness. His right upper extremity has since resolved with exception of some residual numbness and tingling in his fourth and fifth digit however he has noticed little improvement to his left upper extremity. He has  had increased shoulder and elbow movement over the last couple of months, but still overall little improvement compared to original baseline. He has been undergoing physical and occupational therapy and his pain has decreased over time. Detailed physical examination is above. However patient does have dependent edema in his left hand as well as a significant left wrist and fingers contracture. He is tender to palpation the proximal aspect of his left arm and does have anhidrosis noted with significant amount of dry skin on his left forearm. 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 C7-T1 is working, but very difficult to assess due to severe contractures and frozen joints. Would like the patient to undergo evaluation from orthopedic hand surgeon. At this point his bicep strength would be very hard to beat with a nerve transfer for elbow flexion, his main limitation in this regard is an elbow extension contracture. However possible consideration for radial to axillary nerve transfer and spinal accessory to suprascapular nerve transfer for increased movement of his shoulder depending on his progress. Potential options were discussed with the patient and his mother. We would like patient to see occupational therapist who specializes in brachial plexus injuries. We would like to  see him back in clinic in approximately 2 months for reevaluation and progress. He was encouraged to reach out to [place] in the meantime for any questions or concerns.    Patient Stated Goals Want to be able to use my L hand to bath, lift or grip , play games, put my belt on , pick up boxes and do my job    Pain Location Finger (Comment which one)   4th digit   Pain Orientation Left    Pain Descriptors / Indicators Tightness    Pain Type Acute pain    Pain Onset More than a month ago    Aggravating Factors  WITH PROM to 4th                 [facility] OT Assessment - 01/24/21 0001       Strength   Left Hand Grip (lbs) 12    Left Hand Lateral Pinch 7 lbs    Left Hand 3 Point Pinch 5 lbs                      OT Treatments/Exercises (OP) - 01/24/21 0001       LUE Contrast Bath   Time 8 minutes    Comments prior to ROM , soft tissue and knuckle bender flexion stretch             Digits in 3rd and 5th increase at PIP in session -compare to last time - 10 degrees increase PIP flexion   cont to be tight  in wrist ext, supination , elbow flexion and shoulder AROM And digits flexion - [facility] 's worse and 4th and 5th the worse    Pt to cont with  supination combination elbow flexion stretch on table -  flexion from trunk with forearm in supination on table -10 reps hold 3-5 sec Elbow flexion increase to 100 degrees  Followed by AROM and using 1 lbs weight for sup/pro Elbow flexion and extention AROM - hand in neutral 10reps         After heat with ace wrap - done soft tissue mobs to [facility] and CT spreads- webspace stretch 10 reps each And joint mobs to [facility] 's and traction to PIP's -  PROM and flexion stretches to DIP , PIP and MC - prior to AROM  Compression glove most all the time -tubigrip D fitted this date       Pt to cont with knuckle bender splint for 2 -3 x 2 min - 4 bands on radial side and 4 on ulnar side for slight pull and can do AAROM to digits last 2 min  AAROM for MC flexion , intrinsic fist  And  Composite flexion to cylinder object - smaller one provided -  Was able to touch both cylinder with 5th digit- but not 4th - done acewrap for flexion for stretch - pt to do at home Able to touch larger cylinder with all digits- but smaller one not yet with 4th - pain Add and pt done gripping blue med foam block -and pt to do lat and 3 point into it Pt able to do lat and 3 point pinch now in session - see strength in flowsheet   Numbness cont in 5th digit   Flexion of digist over edge of table     Thumb PA and RA  AAROM  Composite flexion of thumb 10 reps  This date could do opposition to 2nd digit  and  can pick up 2 cm object with 2nd and 3rd this date    Wrist extention - table slides done this date and add to HEP - 10 reps pain free - keeping palm down  Wrist AAROM RD, UD ,wrist flexion and extnetion - 10 reps -           OT Education - 01/24/21 1502     Education Details progress and HEP review and changes    Person(s) Educated Patient    Methods Explanation;Demonstration;Tactile cues;Verbal cues;Handout    Comprehension Verbal cues required;Returned demonstration;Verbalized understanding              OT Short Term Goals - 12/31/20 2024       OT SHORT TERM GOAL #1   Title Pt and mom to be independent in HEP to decrease edema and increase AROM in forearm to digits    Baseline no lknowledge- edema increase digits to wrist , AROM decrease in wrist , sup , digits flexion and thumb - see lfowsheet    Time 4    Period Weeks    Status New    Target Date 01/28/21               OT Long Term Goals - 12/31/20 2026       OT LONG TERM GOAL #1   Title L wrist AROM increase by 15-20 degrees for sup, extention, flexion , UD to carry or hold objects on palm and push up from chair    Baseline wrist -25 ext, flexion 52, UD 5, sup 45    Time 6    Period Weeks    Status New    Target Date 02/11/21      OT LONG TERM GOAL #2   Title L digits flexion increase by more than 20 degrees in MC and PIP to hold 2 inch cylinder objects in ADL's and at work    Baseline MC flexion 25-30 degrees, PIP's 30-45 degrees - no grip    Time 6    Period Weeks    Status New    Target Date 02/11/21      OT LONG TERM GOAL #3   Title L digits AROM increase for pt to touch palm to use in 25% of ADL's    Baseline see goal 2 for base line - no grip -and no using hand in any ADL's    Time 12    Period Weeks    Status New    Target Date 03/25/21      OT LONG TERM GOAL #4   Title  update HEP for strengthening and proximal ROM as progress    Time 6    Period Weeks    Status New    Target Date 02/11/21                   Plan - 01/24/21 1503     Clinical Impression Statement Collin 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 C7-T1 during his COVID that he was hospitalize for from Sept thru Dec - and then had [facility] -and return back to work about 6 wks  - but not able to use his L UE - pt present at OT eval  with very little shoulder AROM , elbow flexion to chest 2+/5, extention 3+/5, but increase edema and flexor contractures in forearm sup, wrist in all planes and digits ROM. Pt making weekly progress in elbow flexion, sup, wrist extenion ,  digits flexion- slow but steady- 5th digit and ulnar side of hand cont to be numb per pt -and increase pain wtih PROM to 4th digit- edema mostly over [facility] 's,and palm. THis date able to assess grip 12lbs , lat grip at 7 lbs  and 3 point grip at 3 lbs with support . Pt had some pain in past with pulley's use for shoulder ABD, tolerate flexion better - but also pain - Pt to hold off on ABD if pain - cont PROM for supination , elbow flexion and wrist extention - increase tension on knuckle bender splint for MC flexion followed by AAROM and AROM to cylinder object  - pt very motivated - Pt can benefit from OT services to decrease edema, stiffness and pain -and increase ROM and strength .    OT Occupational Profile and History Problem Focused Assessment - Including review of records relating to presenting problem    Occupational performance deficits (Please refer to evaluation for details): ADL's;IADL's;Work;Play;Leisure;Social Participation    Body Structure / Function / Physical Skills ADL;Coordination;Flexibility;Edema;Dexterity;IADL;UE functional use;Strength;Decreased knowledge of use of DME;FMC;Pain    Rehab Potential Fair    Clinical Decision Making Several treatment options, min-mod task modification necessary     Comorbidities Affecting Occupational Performance: May have comorbidities impacting occupational performance    Modification or Assistance to Complete Evaluation  Min-Moderate modification of tasks or assist with assess necessary to complete eval    OT Frequency 2x / week    OT Duration 12 weeks    OT Treatment/Interventions Self-care/ADL training;Paraffin;Moist Heat;Fluidtherapy;Contrast Bath;Therapeutic exercise;Manual Therapy;Patient/family education;Passive range of motion;Splinting;DME and/or AE instruction    Consulted and Agree with Plan of Care Patient             Patient will benefit from skilled therapeutic intervention in order to improve the following deficits and impairments:   Body Structure / Function / Physical Skills: ADL, Coordination, Flexibility, Edema, Dexterity, IADL, UE functional use, Strength, Decreased knowledge of use of DME, FMC, Pain       Visit Diagnosis: Stiffness of left hand, not elsewhere classified  Stiffness of left wrist, not elsewhere classified  Stiffness of left elbow, not elsewhere classified  Localized edema  Muscle weakness (generalized)    Problem List Patient Active Problem List   Diagnosis Date Noted   Lymphedema of arm 12/11/2020   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   Sleeps in sitting position due to orthopnea 10/02/2020   Obesity with alveolar hypoventilation and body mass index (BMI) of 40 or greater (HCC) 10/02/2020   Chronic intermittent hypoxia with obstructive sleep apnea 10/02/2020   Pressure ulcer of sacral region, stage 4 (HCC) 09/05/2020   Diabetes mellitus (HCC) 09/05/2020   Dyspnea 08/23/2020   Drug induced constipation    Hypokalemia    COVID-19 long hauler manifesting chronic decreased mobility and endurance 06/17/2020   Debility 06/08/2020   Critical illness myopathy    Supplemental oxygen dependent    Essential hypertension    Brachial plexopathy    Neuropathic pain    Sleep apnea     Oletta Cohn OTR/L,CLT 01/24/2021, 3:47 PM  [place] [facility] [address], [place], [address] Phone: [phone]   Fax:  [phone]  Name: Collin Brown MRN: [id number] Date of Birth: 1985-10-29

## 2021-01-28 ENCOUNTER — Other Ambulatory Visit: Payer: Self-pay

## 2021-01-28 ENCOUNTER — Ambulatory Visit: Payer: BC Managed Care – PPO | Admitting: Occupational Therapy

## 2021-01-28 DIAGNOSIS — M25642 Stiffness of left hand, not elsewhere classified: Secondary | ICD-10-CM | POA: Diagnosis not present

## 2021-01-28 DIAGNOSIS — M6281 Muscle weakness (generalized): Secondary | ICD-10-CM

## 2021-01-28 DIAGNOSIS — M25632 Stiffness of left wrist, not elsewhere classified: Secondary | ICD-10-CM

## 2021-01-28 DIAGNOSIS — M25622 Stiffness of left elbow, not elsewhere classified: Secondary | ICD-10-CM

## 2021-01-28 DIAGNOSIS — R600 Localized edema: Secondary | ICD-10-CM

## 2021-01-28 NOTE — Therapy (Signed)
[place] [facility] [address], [place], [address] Phone: [phone]   Fax:  [phone]  Occupational Therapy Treatment  Patient Details  Name: Collin Brown MRN: [id number] Date of Birth: 1985/09/09 Referring Provider (OT): Dr Vladimir Creeks,   Encounter Date: 01/28/2021   OT End of Session - 01/28/21 1449     Visit Number 7    Number of Visits 24    Date for OT Re-Evaluation 03/25/21    OT Start Time 1430    OT Stop Time 1515    OT Time Calculation (min) 45 min    Activity Tolerance Patient tolerated treatment well    Behavior During Therapy Collin Brown for tasks assessed/performed             Past Medical History:  Diagnosis Date   Acute blood loss anemia    Critical illness myopathy    Diabetes mellitus without complication (HCC)    Dyspnea 08/23/2020   Hearing loss in right ear    Hypertension    Pneumonia due to COVID-19 virus 05/04/2020   Sleep apnea     Past Surgical History:  Procedure Laterality Date   TONSILLECTOMY     WOUND DEBRIDEMENT N/A 06/29/2020   Procedure: DEBRIDEMENT SACRAL WOUND;  Surgeon: Axel Filler, MD;  Location: [facility] OR;  Service: General;  Laterality: N/A;    There were no vitals filed for this visit.   Subjective Assessment - 01/28/21 1446     Subjective  I am getting more feeling into my pinkie -but today not a good day - more pain in my hand    Pertinent History Collin Brown is a 35 y.o. male who unfortunately contracted COVID in September 2021. He was ventilated for prolonged period of time and went in recovery notice he had bilateral arm weakness. His right upper extremity has since resolved with exception of some residual numbness and tingling in his fourth and fifth digit however he has noticed little improvement to his left upper extremity. He has had increased shoulder and elbow movement over the last couple of months, but still overall little improvement  compared to original baseline. He has been undergoing physical and occupational therapy and his pain has decreased over time. Detailed physical examination is above. However patient does have dependent edema in his left hand as well as a significant left wrist and fingers contracture. He is tender to palpation the proximal aspect of his left arm and does have anhidrosis noted with significant amount of dry skin on his left forearm. 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 C7-T1 is working, but very difficult to assess due to severe contractures and frozen joints. Would like the patient to undergo evaluation from orthopedic hand surgeon. At this point his bicep strength would be very hard to beat with a nerve transfer for elbow flexion, his main limitation in this regard is an elbow extension contracture. However possible consideration for radial to axillary nerve transfer and spinal accessory to suprascapular nerve transfer for increased movement of his shoulder depending on his progress. Potential options were discussed with the patient and his mother. We would like patient to see occupational therapist who specializes in brachial plexus injuries. We would like to see him back in clinic in approximately 2 months for reevaluation and progress. He was encouraged to reach out to [place] in the meantime  for any questions or concerns.    Patient Stated Goals Want to be able to use my L hand to bath, lift or grip , play games, put my belt on , pick up boxes and do my job    Currently in Pain? Yes    Pain Score 4     Pain Orientation Left    Pain Descriptors / Indicators Aching;Tightness;Tingling    Pain Type Acute pain    Pain Onset More than a month ago    Pain Frequency Constant                [facility] OT Assessment - 01/28/21 0001       AROM   Left Shoulder Extension 50 Degrees    Left Shoulder Flexion  80 Degrees    Left Shoulder ABduction 48 Degrees    Left Elbow Flexion 95    Left Wrist Extension 0 Degrees              AAROM combination elbow flexion stretch on table -  flexion from trunk with forearm in supination on table -10 reps hold 3-5 sec Elbow flexion PROM and contract relax   Compression glove most all the time -tubigrip D fitted this date  hand to forearm and then F for forearm to upper arm - but pt ed that it cannot roll- use wide velcro      Pt to cont with knuckle bender splint for 2 -3 x 2 min - 4 bands on radial side and 4 on ulnar side for slight pull and can do AAROM to digits last 2 min  AAROM for MC flexion , intrinsic fist  And  Composite flexion to cylinder object - smaller one provided -  Was able to touch both cylinder with 5th digit- but not 4th - done acewrap for flexion for stretch - pt to do at home Able to touch larger cylinder with all digits- but smaller one not yet with 4th - pain Add and pt done gripping blue med foam block -and pt to do lat and 3 point into it Pt able to do lat and 3 point pinch now in session - see strength in flowsheet   [place] AAROM on table - flexion , scaption and with palm down and in neutral- 3 x 20 reps Supine- wand for shoulder flexion , ABD AAROM and chest press 10reps Ext rotation stretch with wand  Scapula protraction 7 reps And stabilization at 90s= shoulder flexion  - elbow extended - 2 Letters of alphabet                  OT Education - 01/28/21 1449     Education Details progress and HEP review and changes    Person(s) Educated Patient    Methods Explanation;Demonstration;Tactile cues;Verbal cues;Handout    Comprehension Verbal cues required;Returned demonstration;Verbalized understanding              OT Short Term Goals - 12/31/20 2024       OT SHORT TERM GOAL #1   Title Pt and mom to be independent in HEP to decrease edema and increase AROM in forearm to digits    Baseline no  lknowledge- edema increase digits to wrist , AROM decrease in wrist , sup , digits flexion and thumb - see lfowsheet    Time 4    Period Weeks    Status New    Target Date 01/28/21  OT Long Term Goals - 12/31/20 2026       OT LONG TERM GOAL #1   Title L wrist AROM increase by 15-20 degrees for sup, extention, flexion , UD to carry or hold objects on palm and push up from chair    Baseline wrist -25 ext, flexion 52, UD 5, sup 45    Time 6    Period Weeks    Status New    Target Date 02/11/21      OT LONG TERM GOAL #2   Title L digits flexion increase by more than 20 degrees in MC and PIP to hold 2 inch cylinder objects in ADL's and at work    Baseline MC flexion 25-30 degrees, PIP's 30-45 degrees - no grip    Time 6    Period Weeks    Status New    Target Date 02/11/21      OT LONG TERM GOAL #3   Title L digits AROM increase for pt to touch palm to use in 25% of ADL's    Baseline see goal 2 for base line - no grip -and no using hand in any ADL's    Time 12    Period Weeks    Status New    Target Date 03/25/21      OT LONG TERM GOAL #4   Title update HEP for strengthening and proximal ROM as progress    Time 6    Period Weeks    Status New    Target Date 02/11/21                   Plan - 01/28/21 1449     Clinical Impression Statement Mr. 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 C7-T1 during his COVID that he was hospitalize for from Sept thru Dec - and then had [facility] -and return back to work about 6 wks  - but not able to use his L UE - pt present at OT eval  with very little shoulder AROM , elbow flexion to chest 2+/5, extention 3+/5, but increase edema and flexor contractures in forearm sup, wrist in all planes and digits ROM. Pt making weekly progress in elbow flexion, sup, wrist extenion , digits flexion- slow but steady- 5th digit and ulnar side of hand pt report this date more feeling in -with increase edema today  in upper arm - limiting elbow- add compression to upper arm today to - pt ed on precautions- Focus on shoulder AROM this date in supine - pt show increase shoulder ROM - able to hold onto wand for shoulder HEP-  pt very motivated - Pt can benefit from OT services to decrease edema, stiffness and pain -and increase ROM and strength in L UE .    OT Occupational Profile and History Problem Focused Assessment - Including review of records relating to presenting problem    Occupational performance deficits (Please refer to evaluation for details): ADL's;IADL's;Work;Play;Leisure;Social Participation    Body Structure / Function / Physical Skills ADL;Coordination;Flexibility;Edema;Dexterity;IADL;UE functional use;Strength;Decreased knowledge of use of DME;FMC;Pain    Rehab Potential Fair    Clinical Decision Making Several treatment options, min-mod task modification necessary    Comorbidities Affecting Occupational Performance: May have comorbidities impacting occupational performance    Modification or Assistance to Complete Evaluation  Min-Moderate modification of tasks or assist with assess necessary to complete eval    OT Frequency 2x / week    OT Duration 12 weeks  OT Treatment/Interventions Self-care/ADL training;Paraffin;Moist Heat;Fluidtherapy;Contrast Bath;Therapeutic exercise;Manual Therapy;Patient/family education;Passive range of motion;Splinting;DME and/or AE instruction    Consulted and Agree with Plan of Care Patient             Patient will benefit from skilled therapeutic intervention in order to improve the following deficits and impairments:   Body Structure / Function / Physical Skills: ADL, Coordination, Flexibility, Edema, Dexterity, IADL, UE functional use, Strength, Decreased knowledge of use of DME, FMC, Pain       Visit Diagnosis: Stiffness of left hand, not elsewhere classified  Stiffness of left wrist, not elsewhere classified  Stiffness of left elbow, not  elsewhere classified  Localized edema  Muscle weakness (generalized)    Problem List Patient Active Problem List   Diagnosis Date Noted   Lymphedema of arm 12/11/2020   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   Sleeps in sitting position due to orthopnea 10/02/2020   Obesity with alveolar hypoventilation and body mass index (BMI) of 40 or greater (HCC) 10/02/2020   Chronic intermittent hypoxia with obstructive sleep apnea 10/02/2020   Pressure ulcer of sacral region, stage 4 (HCC) 09/05/2020   Diabetes mellitus (HCC) 09/05/2020   Dyspnea 08/23/2020   Drug induced constipation    Hypokalemia    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 [place], [place], [address] Phone: [phone]   Fax:  [phone]  Name: Julaine HuaMatthew M Brown MRN: [id number] Date of Birth: 12-09-1985

## 2021-01-29 ENCOUNTER — Other Ambulatory Visit: Payer: Self-pay

## 2021-01-29 ENCOUNTER — Encounter: Payer: Self-pay | Admitting: Registered Nurse

## 2021-01-29 ENCOUNTER — Encounter: Payer: BC Managed Care – PPO | Attending: Physical Medicine and Rehabilitation | Admitting: Registered Nurse

## 2021-01-29 VITALS — BP 150/82 | HR 85 | Temp 98.3°F | Ht 70.0 in | Wt 382.4 lb

## 2021-01-29 DIAGNOSIS — G894 Chronic pain syndrome: Secondary | ICD-10-CM | POA: Diagnosis present

## 2021-01-29 DIAGNOSIS — Z5181 Encounter for therapeutic drug level monitoring: Secondary | ICD-10-CM

## 2021-01-29 DIAGNOSIS — L89154 Pressure ulcer of sacral region, stage 4: Secondary | ICD-10-CM | POA: Diagnosis present

## 2021-01-29 DIAGNOSIS — Z79891 Long term (current) use of opiate analgesic: Secondary | ICD-10-CM | POA: Diagnosis present

## 2021-01-29 DIAGNOSIS — G7281 Critical illness myopathy: Secondary | ICD-10-CM

## 2021-01-29 MED ORDER — OXYCODONE-ACETAMINOPHEN 5-325 MG PO TABS: 1.0000 | ORAL_TABLET | Freq: Four times a day (QID) | ORAL | 0 refills | Status: DC | PRN

## 2021-01-29 NOTE — Progress Notes (Signed)
Subjective:    Patient ID: Collin Brown, male    DOB: 1986/04/24, 35 y.o.   MRN: [id number]  HPI: Collin Brown is a 35 y.o. male who returns for follow up appointment for chronic pain and medication refill. He states his pain is located in his left arm and occasionally in his lower back. He rates his pain 5. His current exercise regime is walking and performing stretching exercises. Attending Occupational Therapy twice a week.  Mr. Fredericksen Morphine equivalent is 30.00  MME.  Last UDS was Performed on 10/11/2020, it was consistent.      Pain Inventory Average Pain 4 Pain Right Now 5 My pain is constant  In the last 24 hours, has pain interfered with the following? General activity 7 Relation with others 3 Enjoyment of life 4 What TIME of day is your pain at its worst? morning  and night Sleep (in general) Fair  Pain is worse with: walking, standing, and some activites Pain improves with: rest and medication Relief from Meds: 8  Family History  Problem Relation Age of Onset   Diabetes Mellitus II Mother    Hypertension Mother    Diabetes Mother    Diabetes Mellitus II Father    High blood pressure Father    Heart attack Father    Social History   Socioeconomic History   Marital status: Single    Spouse name: Not on file   Number of children: Not on file   Years of education: Not on file   Highest education level: Not on file  Occupational History   Occupation: Risk manager  Tobacco Use   Smoking status: Never   Smokeless tobacco: Never  Vaping Use   Vaping Use: Never used  Substance and Sexual Activity   Alcohol use: No   Drug use: No   Sexual activity: Yes    Birth control/protection: None  Other Topics Concern   Not on file  Social History Narrative   Not on file   Social Determinants of Health   Financial Resource Strain: Not on file  Food Insecurity: Not on file  Transportation Needs: Not on file  Physical Activity: Not on file  Stress: Not  on file  Social Connections: Not on file   Past Surgical History:  Procedure Laterality Date   TONSILLECTOMY     WOUND DEBRIDEMENT N/A 06/29/2020   Procedure: DEBRIDEMENT SACRAL WOUND;  Surgeon: Axel Filler, MD;  Location: [facility] OR;  Service: General;  Laterality: N/A;   Past Surgical History:  Procedure Laterality Date   TONSILLECTOMY     WOUND DEBRIDEMENT N/A 06/29/2020   Procedure: DEBRIDEMENT SACRAL WOUND;  Surgeon: Axel Filler, MD;  Location: [facility] OR;  Service: General;  Laterality: N/A;   Past Medical History:  Diagnosis Date   Acute blood loss anemia    Critical illness myopathy    Diabetes mellitus without complication (HCC)    Dyspnea 08/23/2020   Hearing loss in right ear    Hypertension    Pneumonia due to COVID-19 virus 05/04/2020   Sleep apnea    BP (!) 150/82 (BP Location: Right Arm, Patient Position: Sitting, Cuff Size: Large)   Pulse 85   Temp 98.3 F (36.8 C) (Oral)   Ht 5\' 10"  (1.778 m)   Wt (!) 382 lb 6.4 oz (173.5 kg)   SpO2 94%   BMI 54.87 kg/m   Opioid Risk Score:   Fall Risk Score:  `1  Depression screen PHQ 2/9  Depression  screen [facility] 2/9 01/01/2021 12/31/2020 12/20/2020 11/30/2020 11/20/2020 11/08/2020 10/15/2020  Decreased Interest 0 0 0 2 2 1  0  Down, Depressed, Hopeless 0 0 0 1 1 1  0  PHQ - 2 Score 0 0 0 3 3 2  0  Altered sleeping - - - 0 0 1 -  Tired, decreased energy - - - 2 2 - -  Change in appetite - - - 3 3 - -  Feeling bad or failure about yourself  - - - 2 2 - -  Trouble concentrating - - - 0 0 - -  Moving slowly or fidgety/restless - - - 1 1 - -  Suicidal thoughts - - - 0 0 - -  PHQ-9 Score - - - 11 11 3  -  Difficult doing work/chores - - - Somewhat difficult Somewhat difficult - -      Review of Systems  Constitutional: Negative.   HENT: Negative.    Eyes: Negative.   Respiratory: Negative.    Cardiovascular: Negative.   Gastrointestinal: Negative.   Endocrine: Negative.   Genitourinary: Negative.   Musculoskeletal:   Positive for back pain.       Left arm pain  Skin: Negative.   Neurological: Negative.   Hematological: Negative.   Psychiatric/Behavioral: Negative.        Objective:   Physical Exam Vitals and nursing note reviewed.  Constitutional:      Appearance: Normal appearance. He is obese.  Cardiovascular:     Rate and Rhythm: Normal rate and regular rhythm.     Pulses: Normal pulses.     Heart sounds: Normal heart sounds.  Pulmonary:     Effort: Pulmonary effort is normal.     Breath sounds: Normal breath sounds.  Musculoskeletal:     Cervical back: Normal range of motion and neck supple.     Comments: Normal Muscle Bulk and Muscle Testing Reveals:  Upper Extremities: Right: Full ROM and Muscle Strength 5/5 Left Upper Extremity: Decreased ROM 20 Degrees and Muscle Strength 2/5 Left Upper Extremity with compression stocking and wearing left wrist splint  Lower Extremities: Full ROM and Muscle Strength 5/5 Arises from Table with ease Narrow Based  Gait     Skin:    General: Skin is warm and dry.  Neurological:     Mental Status: He is alert and oriented to person, place, and time.  Psychiatric:        Mood and Affect: Mood normal.        Behavior: Behavior normal.         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 Continue to monitor. 01/29/2021 2. Chronic Low Back Pain without Sciatica: No complaints today. Continue HEP as Tolerated. Continue to monitor. 01/29/2021 3. Sacral Wound:  Wound Center Following. Continue to monitor. 01/29/2021 4. Chronic Pain Syndrome: Refilled: Oxycodone 5/325 mg mg one tablet every 6 hours as needed for pain #120.  We will continue the opioid monitoring program, this consists of regular clinic visits, examinations, urine drug screen, pill counts as well as use of Controlled Substance Reporting system. A 12 month History has been reviewed on the 01/31/2021 Controlled Substance Reporting System  on 01/29/2021.   F/U in 1 month

## 2021-01-30 ENCOUNTER — Ambulatory Visit: Payer: BC Managed Care – PPO | Admitting: Occupational Therapy

## 2021-01-30 DIAGNOSIS — M6281 Muscle weakness (generalized): Secondary | ICD-10-CM

## 2021-01-30 DIAGNOSIS — M25632 Stiffness of left wrist, not elsewhere classified: Secondary | ICD-10-CM

## 2021-01-30 DIAGNOSIS — R600 Localized edema: Secondary | ICD-10-CM

## 2021-01-30 DIAGNOSIS — M25612 Stiffness of left shoulder, not elsewhere classified: Secondary | ICD-10-CM

## 2021-01-30 DIAGNOSIS — M25642 Stiffness of left hand, not elsewhere classified: Secondary | ICD-10-CM | POA: Diagnosis not present

## 2021-01-30 DIAGNOSIS — M25622 Stiffness of left elbow, not elsewhere classified: Secondary | ICD-10-CM

## 2021-01-30 NOTE — Therapy (Signed)
[facility] [address], [place], [address] Phone: [phone]   Fax:  [phone]  Occupational Therapy Treatment  Patient Details  Name: Collin Brown MRN: [id number] Date of Birth: 01-18-1986 Referring Provider (OT): Dr Vladimir Creeks,   Encounter Date: 01/30/2021   OT End of Session - 01/30/21 1602     Visit Number 8    Number of Visits 24    Date for OT Re-Evaluation 03/25/21    OT Start Time 1515    OT Stop Time 1600    OT Time Calculation (min) 45 min    Activity Tolerance Patient tolerated treatment well    Behavior During Therapy [facility] for tasks assessed/performed             Past Medical History:  Diagnosis Date   Acute blood loss anemia    Critical illness myopathy    Diabetes mellitus without complication (HCC)    Dyspnea 08/23/2020   Hearing loss in right ear    Hypertension    Pneumonia due to COVID-19 virus 05/04/2020   Sleep apnea     Past Surgical History:  Procedure Laterality Date   TONSILLECTOMY     WOUND DEBRIDEMENT N/A 06/29/2020   Procedure: DEBRIDEMENT SACRAL WOUND;  Surgeon: Axel Filler, MD;  Location: [facility] OR;  Service: General;  Laterality: N/A;    There were no vitals filed for this visit.   Subjective Assessment - 01/30/21 1522     Subjective  Was sore after last time- 6/10 the next day - shoulder mostly - and did good workout yesterday - try and do my exercises everything once day - try and use more - I can hold now grocery bag and put things in it - I can carry light bag, waterbottle    Pertinent History Collin Brown is a 35 y.o. male who unfortunately contracted COVID in September 2021. He was ventilated for prolonged period of time and went in recovery notice he had bilateral arm weakness. His right upper extremity has since resolved with exception of some residual numbness and tingling in his fourth and fifth digit however he has noticed little  improvement to his left upper extremity. He has had increased shoulder and elbow movement over the last couple of months, but still overall little improvement compared to original baseline. He has been undergoing physical and occupational therapy and his pain has decreased over time. Detailed physical examination is above. However patient does have dependent edema in his left hand as well as a significant left wrist and fingers contracture. He is tender to palpation the proximal aspect of his left arm and does have anhidrosis noted with significant amount of dry skin on his left forearm. 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 C7-T1 is working, but very difficult to assess due to severe contractures and frozen joints. Would like the patient to undergo evaluation from orthopedic hand surgeon. At this point his bicep strength would be very hard to beat with a nerve transfer for elbow flexion, his main limitation in this regard is an elbow extension contracture. However possible consideration for radial to axillary nerve transfer and spinal accessory to suprascapular nerve transfer for increased movement of his shoulder depending on his progress. Potential options were discussed with the patient and his mother. We would like patient to see occupational therapist who specializes in  brachial plexus injuries. We would like to see him back in clinic in approximately 2 months for reevaluation and progress. He was encouraged to reach out to us in the meantime for any questions or concerns.    Patient Stated Goals Want to be able to use my L hand to bath, lift or grip , play games, put my belt on , pick up boxes and do my job    Currently in Pain? Yes    Pain Score 1     Pain Location Hand    Pain Orientation Left    Pain Descriptors / Indicators Aching;Tightness;Burning    Pain Type Acute pain;Chronic pain    Pain  Frequency Intermittent                [facility] OT Assessment - 01/30/21 0001       AROM   Left Shoulder Extension 55 Degrees    Left Shoulder Flexion 70 Degrees    Left Shoulder ABduction 52 Degrees      Strength   Right Hand Grip (lbs) 71    Left Hand Grip (lbs) 12    Left Hand Lateral Pinch 9 lbs    Left Hand 3 Point Pinch 6 lbs              Pt unable to keep tubigrip with velcro band on upper arm  - kept rolling down  done soft tissue mobs to [facility] and [facility] spreads- webspace stretch 10 reps each  And joint mobs to [facility] 's and traction to PIP's - prior to AROM Increase sensation in 5th digit- and cont pain in 4th and 5th MC's - with joint mobs and PROM or flexion stretch MC spreads in combination of wrist extention Compression glove most all the time -tubigrip D cont on hand to elbow     Pt to cont with knuckle bender splint for 2 -3 x 2 min -  increase to 6 4 bands on radial side and 6 on ulnar side for slight pull and can do PROM for composite flexion or PIP while in splint - 2 x  2 min  5/10 pain with PROM of 4th and 5th PIP  AAROM for MC flexion , intrinsic fist  And  Composite flexion to blue foam block - and squeezing - place and hold 4th and 5th -   Teal putty done and add to HEP for lat grip and 3 point grip - 2 x 12 reps  Pain free  acewrap  done to digits around 2 lbs - on table done 2 lbs - compensate with elbow extention for pronation - and elevation of shoulder  5/10 pain in 4th and 5th digit - with wrap into flexion   Add wrist splint or Benik wrist wrap for wrist - during gripping - to compensate with wrist flexion during pinch and grip  Pt report can hold or carry water bottle , phone or bag to put light groceries in at work    Thumb PA and RA  AAROM  Composite flexion of thumb 10 reps  This date could do opposition to 2nd  and 3rd digits   Wrist extention - table slides done this date and add to HEP - 10 reps pain free - keeping palm down  Wrist  AAROM RD, UD ,wrist flexion and extnetion - 10 reps -                    OT Education - 01/30/21 [id number]  Education Details progress and HEP review and changes    Person(s) Educated Patient    Methods Explanation;Demonstration;Tactile cues;Verbal cues;Handout    Comprehension Verbal cues required;Returned demonstration;Verbalized understanding              OT Short Term Goals - 12/31/20 2024       OT SHORT TERM GOAL #1   Title Pt and mom to be independent in HEP to decrease edema and increase AROM in forearm to digits    Baseline no lknowledge- edema increase digits to wrist , AROM decrease in wrist , sup , digits flexion and thumb - see lfowsheet    Time 4    Period Weeks    Status New    Target Date 01/28/21               OT Long Term Goals - 12/31/20 2026       OT LONG TERM GOAL #1   Title L wrist AROM increase by 15-20 degrees for sup, extention, flexion , UD to carry or hold objects on palm and push up from chair    Baseline wrist -25 ext, flexion 52, UD 5, sup 45    Time 6    Period Weeks    Status New    Target Date 02/11/21      OT LONG TERM GOAL #2   Title L digits flexion increase by more than 20 degrees in MC and PIP to hold 2 inch cylinder objects in ADL's and at work    Baseline MC flexion 25-30 degrees, PIP's 30-45 degrees - no grip    Time 6    Period Weeks    Status New    Target Date 02/11/21      OT LONG TERM GOAL #3   Title L digits AROM increase for pt to touch palm to use in 25% of ADL's    Baseline see goal 2 for base line - no grip -and no using hand in any ADL's    Time 12    Period Weeks    Status New    Target Date 03/25/21      OT LONG TERM GOAL #4   Title update HEP for strengthening and proximal ROM as progress    Time 6    Period Weeks    Status New    Target Date 02/11/21                   Plan - 01/30/21 1603     Clinical Impression Statement Collin Brown has likely suffered from an upper trunk  brachial plexus injury. C7-T1 during his COVID that he was hospitalize for from Sept thru Dec - and then had [facility] -and return back to work about 6 wks  - but not able to use his L UE - pt present at OT eval  with very little shoulder AROM , elbow flexion to chest 2+/5, extention 3+/5, but increase edema and flexor contractures in forearm sup, wrist in all planes and digits ROM. Pt making weekly progress in elbow flexion, sup, wrist extenion , digits flexion- slow but steady- 5th digit and ulnar side of hand pt report this date more feeling in - focus on digits PROM and adjusted knucklebender for MC flexion , and composite PROM - grip and pinch grip -add putty and 2 lbs for supination  but hand wrapped with acewrap - able to do AROM opposition this date to 2nd and 3rd -  pt very  motivated - Pt can benefit from OT services to decrease edema, stiffness and pain -and increase ROM and strength in L UE .    OT Occupational Profile and History Problem Focused Assessment - Including review of records relating to presenting problem    Occupational performance deficits (Please refer to evaluation for details): ADL's;IADL's;Work;Play;Leisure;Social Participation    Body Structure / Function / Physical Skills ADL;Coordination;Flexibility;Edema;Dexterity;IADL;UE functional use;Strength;Decreased knowledge of use of DME;FMC;Pain    Rehab Potential Fair    Clinical Decision Making Several treatment options, min-mod task modification necessary    Comorbidities Affecting Occupational Performance: May have comorbidities impacting occupational performance    Modification or Assistance to Complete Evaluation  Min-Moderate modification of tasks or assist with assess necessary to complete eval    OT Frequency 2x / week    OT Duration 12 weeks    OT Treatment/Interventions Self-care/ADL training;Paraffin;Moist Heat;Fluidtherapy;Contrast Bath;Therapeutic exercise;Manual Therapy;Patient/family education;Passive range of  motion;Splinting;DME and/or AE instruction    Consulted and Agree with Plan of Care Patient             Patient will benefit from skilled therapeutic intervention in order to improve the following deficits and impairments:   Body Structure / Function / Physical Skills: ADL, Coordination, Flexibility, Edema, Dexterity, IADL, UE functional use, Strength, Decreased knowledge of use of DME, FMC, Pain       Visit Diagnosis: Stiffness of left hand, not elsewhere classified  Stiffness of left elbow, not elsewhere classified  Localized edema  Muscle weakness (generalized)  Stiffness of left shoulder, not elsewhere classified  Stiffness of left wrist, not elsewhere classified    Problem List Patient Active Problem List   Diagnosis Date Noted   Lymphedema of arm 12/11/2020   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   Sleeps in sitting position due to orthopnea 10/02/2020   Obesity with alveolar hypoventilation and body mass index (BMI) of 40 or greater (HCC) 10/02/2020   Chronic intermittent hypoxia with obstructive sleep apnea 10/02/2020   Pressure ulcer of sacral region, stage 4 (HCC) 09/05/2020   Diabetes mellitus (HCC) 09/05/2020   Dyspnea 08/23/2020   Drug induced constipation    Hypokalemia    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 [place], [address] Phone: [phone]   Fax:  [phone]  Name: SEBERT STOLLINGS MRN: [id number] Date of Birth: 1986-06-21

## 2021-01-31 ENCOUNTER — Other Ambulatory Visit: Payer: Self-pay

## 2021-01-31 ENCOUNTER — Encounter (HOSPITAL_BASED_OUTPATIENT_CLINIC_OR_DEPARTMENT_OTHER): Payer: BC Managed Care – PPO | Admitting: Internal Medicine

## 2021-01-31 DIAGNOSIS — E11622 Type 2 diabetes mellitus with other skin ulcer: Secondary | ICD-10-CM | POA: Diagnosis not present
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
## 2021-02-01 NOTE — Progress Notes (Signed)
[name], Collin Brown ([id number]) Visit Report for 01/31/2021 Arrival Information Details Patient Name: Date of Service: Collin Brown, [place] M. 01/31/2021 3:45 PM Medical Record Number: [id number] Patient Account Number: [id number] Date of Birth/Sex: Treating RN: Jun 11, 1986 (35 y.o. Lorette Ang, Meta.Reding Primary Care Domenik Trice: Lavon Paganini Other Clinician: Referring Martez Weiand: Treating Arlette Schaad/Extender: Darlen Round in Treatment: 24 Visit Information History Since Last Visit Added or deleted any medications: No Patient Arrived: Ambulatory Any new allergies or adverse reactions: No Arrival Time: 15:28 Had a fall or experienced change in No Accompanied By: mom activities of daily living that may affect Transfer Assistance: None risk of falls: Patient Identification Verified: Yes Signs or symptoms of abuse/neglect since last visito No Secondary Verification Process Completed: Yes Hospitalized since last visit: No Patient Requires Transmission-Based Precautions: No Implantable device outside of the clinic excluding No Patient Has Alerts: No cellular tissue based products placed in the center since last visit: Has Dressing in Place as Prescribed: Yes Brown Present Now: No Electronic Signature(s) Signed: 01/31/2021 4:12:25 PM By: Sandre Kitty Entered By: Sandre Kitty on 01/31/2021 15:28:19 -------------------------------------------------------------------------------- Clinic Level of Care Assessment Details Patient Name: Date of Service: Collin Brown, [place] M. 01/31/2021 3:45 PM Medical Record Number: [id number] Patient Account Number: [id number] Date of Birth/Sex: Treating RN: [birth year] (35 y.o. Collin Brown Primary Care Tammatha Cobb: Lavon Paganini Other Clinician: Referring Shwanda Soltis: Treating Dudley Mages/Extender: Darlen Round in Treatment: 24 Clinic Level of Care Assessment Items TOOL 4 Quantity Score X- 1 0 Use when  only an EandM is performed on FOLLOW-UP visit ASSESSMENTS - Nursing Assessment / Reassessment X- 1 10 Reassessment of Co-morbidities (includes updates in patient status) X- 1 5 Reassessment of Adherence to Treatment Plan ASSESSMENTS - Wound and Skin A ssessment / Reassessment X - Simple Wound Assessment / Reassessment - one wound 1 5 '[]'  - 0 Complex Wound Assessment / Reassessment - multiple wounds X- 1 10 Dermatologic / Skin Assessment (not related to wound area) ASSESSMENTS - Focused Assessment '[]'  - 0 Circumferential Edema Measurements - multi extremities X- 1 10 Nutritional Assessment / Counseling / Intervention '[]'  - 0 Lower Extremity Assessment (monofilament, tuning fork, pulses) '[]'  - 0 Peripheral Arterial Disease Assessment (using hand held doppler) ASSESSMENTS - Ostomy and/or Continence Assessment and Care '[]'  - 0 Incontinence Assessment and Management '[]'  - 0 Ostomy Care Assessment and Management (repouching, etc.) PROCESS - Coordination of Care X - Simple Patient / Family Education for ongoing care 1 15 '[]'  - 0 Complex (extensive) Patient / Family Education for ongoing care X- 1 10 Staff obtains Consents, Records, T Results / Process Orders est '[]'  - 0 Staff telephones HHA, Nursing Homes / Clarify orders / etc '[]'  - 0 Routine Transfer to another Facility (non-emergent condition) '[]'  - 0 Routine Hospital Admission (non-emergent condition) '[]'  - 0 New Admissions / Biomedical engineer / Ordering NPWT Apligraf, etc. , '[]'  - 0 Emergency Hospital Admission (emergent condition) X- 1 10 Simple Discharge Coordination '[]'  - 0 Complex (extensive) Discharge Coordination PROCESS - Special Needs '[]'  - 0 Pediatric / Minor Patient Management '[]'  - 0 Isolation Patient Management '[]'  - 0 Hearing / Language / Visual special needs '[]'  - 0 Assessment of Community assistance (transportation, D/C planning, etc.) '[]'  - 0 Additional assistance / Altered mentation '[]'  - 0 Support  Surface(s) Assessment (bed, cushion, seat, etc.) INTERVENTIONS - Wound Cleansing / Measurement X - Simple Wound Cleansing - one wound 1 5 '[]'  - 0 Complex Wound Cleansing - multiple wounds  X- 1 5 Wound Imaging (photographs - any number of wounds) '[]'  - 0 Wound Tracing (instead of photographs) X- 1 5 Simple Wound Measurement - one wound '[]'  - 0 Complex Wound Measurement - multiple wounds INTERVENTIONS - Wound Dressings X - Small Wound Dressing one or multiple wounds 1 10 '[]'  - 0 Medium Wound Dressing one or multiple wounds '[]'  - 0 Large Wound Dressing one or multiple wounds '[]'  - 0 Application of Medications - topical '[]'  - 0 Application of Medications - injection INTERVENTIONS - Miscellaneous '[]'  - 0 External ear exam '[]'  - 0 Specimen Collection (cultures, biopsies, blood, body fluids, etc.) '[]'  - 0 Specimen(s) / Culture(s) sent or taken to Lab for analysis '[]'  - 0 Patient Transfer (multiple staff / Civil Service fast streamer / Similar devices) '[]'  - 0 Simple Staple / Suture removal (25 or less) '[]'  - 0 Complex Staple / Suture removal (26 or more) '[]'  - 0 Hypo / Hyperglycemic Management (close monitor of Blood Glucose) '[]'  - 0 Ankle / Brachial Index (ABI) - do not check if billed separately X- 1 5 Vital Signs Has the patient been seen at the hospital within the last three years: Yes Total Score: 105 Level Of Care: New/Established - Level 3 Electronic Signature(s) Signed: 01/31/2021 5:46:50 PM By: Deon Pilling Entered By: Deon Pilling on 01/31/2021 15:54:59 -------------------------------------------------------------------------------- Encounter Discharge Information Details Patient Name: Date of Service: Collin Pain, [name] 01/31/2021 3:45 PM Medical Record Number: [id number] Patient Account Number: [id number] Date of Birth/Sex: Treating RN: [birth year] (35 y.o. Collin Brown Primary Care Manuelita Moxon: Lavon Paganini Other Clinician: Referring Nasario Czerniak: Treating Helmer Dull/Extender: Darlen Round in Treatment: 24 Encounter Discharge Information Items Discharge Condition: Stable Ambulatory Status: Ambulatory Discharge Destination: Home Transportation: Private Auto Accompanied By: mother Schedule Follow-up Appointment: Yes Clinical Summary of Care: Electronic Signature(s) Signed: 01/31/2021 5:46:50 PM By: Deon Pilling Entered By: Deon Pilling on 01/31/2021 16:11:55 -------------------------------------------------------------------------------- Multi Wound Chart Details Patient Name: Date of Service: Collin Pain, [name] 01/31/2021 3:45 PM Medical Record Number: [id number] Patient Account Number: [id number] Date of Birth/Sex: Treating RN: [birth year] (35 y.o. Collin Brown Primary Care Jaxsyn Azam: Lavon Paganini Other Clinician: Referring Abelina Ketron: Treating Aamari Strawderman/Extender: Darlen Round in Treatment: 24 Vital Signs Height(in): 70 Pulse(bpm): 94 Weight(lbs): 341 Blood Pressure(mmHg): 137/87 Body Mass Index(BMI): 49 Temperature(F): 98.4 Respiratory Rate(breaths/min): 18 Photos: [1:No Photos Sacrum] [N/A:N/A N/A] Wound Location: [1:Pressure Injury] [N/A:N/A] Wounding Event: [1:Pressure Ulcer] [N/A:N/A] Primary Etiology: [1:Anemia, Sleep Apnea, Hypertension,] [N/A:N/A] Comorbid History: [1:Type II Diabetes 05/18/2020] [N/A:N/A] Date Acquired: [1:24] [N/A:N/A] Weeks of Treatment: [1:Open] [N/A:N/A] Wound Status: [1:0.1x0.1x0.1] [N/A:N/A] Measurements L x W x D (cm) [1:0.008] [N/A:N/A] A (cm) : rea [1:0.001] [N/A:N/A] Volume (cm) : [1:99.40%] [N/A:N/A] % Reduction in A rea: [1:100.00%] [N/A:N/A] % Reduction in Volume: [1:Category/Stage IV] [N/A:N/A] Classification: [1:None Present] [N/A:N/A] Exudate A mount: [1:Well defined, not attached] [N/A:N/A] Wound Margin: [1:None Present (0%)] [N/A:N/A] Granulation A mount: [1:None Present (0%)] [N/A:N/A] Necrotic A mount: [1:Fascia: No] [N/A:N/A] Exposed  Structures: [1:Fat Layer (Subcutaneous Tissue): No Tendon: No Muscle: No Joint: No Bone: No Large (67-100%)] [N/A:N/A] Treatment Notes Electronic Signature(s) Signed: 01/31/2021 5:20:02 PM By: Linton Ham MD Signed: 01/31/2021 5:46:50 PM By: Deon Pilling Entered By: Linton Ham on 01/31/2021 15:57:40 -------------------------------------------------------------------------------- Multi-Disciplinary Care Plan Details Patient Name: Date of Service: Collin Pain, [name] 01/31/2021 3:45 PM Medical Record Number: [id number] Patient Account Number: [id number] Date of Birth/Sex: Treating RN: July 04, 1986 (34 y.o. Collin Brown Primary Care Zennie Ayars: Lavon Paganini Other  Clinician: Referring Endia Moncur: Treating Alisah Grandberry/Extender: Darlen Round in Treatment: 24 Multidisciplinary Care Plan reviewed with physician Active Inactive Nutrition Nursing Diagnoses: Potential for alteratiion in Nutrition/Potential for imbalanced nutrition Goals: Patient/caregiver agrees to and verbalizes understanding of need to obtain nutritional consultation Date Initiated: 08/16/2020 Date Inactivated: 09/14/2020 Target Resolution Date: 09/14/2020 Goal Status: Met Patient/caregiver verbalizes understanding of need to maintain therapeutic glucose control per primary care physician Date Initiated: 08/16/2020 Target Resolution Date: 02/08/2021 Goal Status: Active Interventions: Assess HgA1c results as ordered upon admission and as needed Provide education on elevated blood sugars and impact on wound healing Provide education on nutrition Treatment Activities: Education provided on Nutrition : 01/17/2021 Obtain HgA1c : 08/16/2020 Patient referred to Primary Care Physician for further nutritional evaluation : 08/16/2020 Notes: 12/07/20: Glucose control ongoing, target date extended. Pressure Nursing Diagnoses: Knowledge deficit related to management of pressures ulcers Potential for  impaired tissue integrity related to pressure, friction, moisture, and shear Goals: Patient will remain free from development of additional pressure ulcers Date Initiated: 08/16/2020 Date Inactivated: 09/14/2020 Target Resolution Date: 09/14/2020 Goal Status: Met Patient/caregiver will verbalize understanding of pressure ulcer management Date Initiated: 08/16/2020 Target Resolution Date: 02/08/2021 Goal Status: Active Interventions: Assess: immobility, friction, shearing, incontinence upon admission and as needed Assess potential for pressure ulcer upon admission and as needed Provide education on pressure ulcers Treatment Activities: Consult for HBO : 08/16/2020 Pressure reduction/relief device ordered : 08/16/2020 T ordered outside of clinic : 08/16/2020 est Notes: Electronic Signature(s) Signed: 01/31/2021 5:46:50 PM By: Deon Pilling Entered By: Deon Pilling on 01/31/2021 15:54:20 -------------------------------------------------------------------------------- Brown Assessment Details Patient Name: Date of Service: Collin Pain, [name] 01/31/2021 3:45 PM Medical Record Number: [id number] Patient Account Number: [id number] Date of Birth/Sex: Treating RN: Oct 19, 1985 (35 y.o. Collin Brown Primary Care Anadelia Kintz: Lavon Paganini Other Clinician: Referring Jatoria Kneeland: Treating Annalei Friesz/Extender: Darlen Round in Treatment: 24 Active Problems Location of Brown Severity and Description of Brown Patient Has Paino No Site Locations Brown Management and Medication Current Brown Management: Electronic Signature(s) Signed: 01/31/2021 4:12:25 PM By: Sandre Kitty Signed: 01/31/2021 5:46:50 PM By: Deon Pilling Entered By: Sandre Kitty on 01/31/2021 15:29:52 -------------------------------------------------------------------------------- Patient/Caregiver Education Details Patient Name: Date of Service: Collin Pain, MA TTHEW Jerilynn Mages 6/23/2022andnbsp3:45 PM Medical Record  Number: [id number] Patient Account Number: [id number] Date of Birth/Gender: Treating RN: [birth year] (35 y.o. Collin Brown Primary Care Physician: Lavon Paganini Other Clinician: Referring Physician: Treating Physician/Extender: Darlen Round in Treatment: 24 Education Assessment Education Provided To: Patient Education Topics Provided Elevated Blood Sugar/ Impact on Healing: Handouts: Elevated Blood Sugars: How Do They Affect Wound Healing Methods: Explain/Verbal Responses: Reinforcements needed Electronic Signature(s) Signed: 01/31/2021 5:46:50 PM By: Deon Pilling Entered By: Deon Pilling on 01/31/2021 15:52:26 -------------------------------------------------------------------------------- Wound Assessment Details Patient Name: Date of Service: Collin Pain, [name] 01/31/2021 3:45 PM Medical Record Number: [id number] Patient Account Number: [id number] Date of Birth/Sex: Treating RN: 11-21-1985 (35 y.o. Collin Brown Primary Care Kelton Bultman: Lavon Paganini Other Clinician: Referring Tommye Lehenbauer: Treating Alailah Safley/Extender: Darlen Round in Treatment: 24 Wound Status Wound Number: 1 Primary Etiology: Pressure Ulcer Wound Location: Sacrum Wound Status: Open Wounding Event: Pressure Injury Comorbid History: Anemia, Sleep Apnea, Hypertension, Type II Diabetes Date Acquired: 05/18/2020 Weeks Of Treatment: 24 Clustered Wound: No Photos Wound Measurements Length: (cm) 0.1 Width: (cm) 0.1 Depth: (cm) 0.1 Area: (cm) 0.008 Volume: (cm) 0.001 % Reduction in Area: 99.4% % Reduction in Volume: 100% Epithelialization: Large (67-100%) Tunneling: No Undermining: No  Wound Description Classification: Category/Stage IV Wound Margin: Well defined, not attached Exudate Amount: None Present Foul Odor After Cleansing: No Slough/Fibrino No Wound Bed Granulation Amount: None Present (0%) Exposed Structure Necrotic  Amount: None Present (0%) Fascia Exposed: No Fat Layer (Subcutaneous Tissue) Exposed: No Tendon Exposed: No Muscle Exposed: No Joint Exposed: No Bone Exposed: No Treatment Notes Wound #1 (Sacrum) Cleanser Normal Saline Discharge Instruction: Cleanse the wound with Normal Saline prior to applying a clean dressing using gauze sponges, not tissue or cotton balls. Peri-Wound Care Topical Primary Dressing Maxorb Extra Calcium Alginate 2x2 in Discharge Instruction: Apply calcium alginate to wound bed as instructed Secondary Dressing Allevyn Adhesive Foam Sacrum Dressing, 6.75x6.75 (in/in) Discharge Instruction: Apply over primary dressing. Secured With Compression Wrap Compression Stockings Environmental education officer) Signed: 02/01/2021 12:34:28 PM By: Sandre Kitty Signed: 02/01/2021 6:02:43 PM By: Deon Pilling Previous Signature: 01/31/2021 5:46:50 PM Version By: Deon Pilling Entered By: Sandre Kitty on 02/01/2021 12:30:21 -------------------------------------------------------------------------------- Vitals Details Patient Name: Date of Service: Collin Pain, [name] 01/31/2021 3:45 PM Medical Record Number: [id number] Patient Account Number: [id number] Date of Birth/Sex: Treating RN: Sep 23, 1985 (35 y.o. Collin Brown Primary Care Shrihan Putt: Lavon Paganini Other Clinician: Referring Bekah Igoe: Treating Davontay Watlington/Extender: Darlen Round in Treatment: 24 Vital Signs Time Taken: 15:28 Temperature (F): 98.4 Height (in): 70 Pulse (bpm): 94 Weight (lbs): 341 Respiratory Rate (breaths/min): 18 Body Mass Index (BMI): 48.9 Blood Pressure (mmHg): 137/87 Reference Range: 80 - 120 mg / dl Electronic Signature(s) Signed: 01/31/2021 4:12:25 PM By: Sandre Kitty Entered By: Sandre Kitty on 01/31/2021 15:29:43

## 2021-02-04 ENCOUNTER — Ambulatory Visit: Payer: BC Managed Care – PPO | Admitting: Occupational Therapy

## 2021-02-07 ENCOUNTER — Other Ambulatory Visit: Payer: Self-pay

## 2021-02-07 ENCOUNTER — Encounter: Payer: BC Managed Care – PPO | Attending: Family Medicine | Admitting: Dietician

## 2021-02-07 ENCOUNTER — Ambulatory Visit: Payer: BC Managed Care – PPO | Admitting: Occupational Therapy

## 2021-02-07 DIAGNOSIS — M25632 Stiffness of left wrist, not elsewhere classified: Secondary | ICD-10-CM

## 2021-02-07 DIAGNOSIS — M25642 Stiffness of left hand, not elsewhere classified: Secondary | ICD-10-CM

## 2021-02-07 DIAGNOSIS — R600 Localized edema: Secondary | ICD-10-CM

## 2021-02-07 DIAGNOSIS — M25612 Stiffness of left shoulder, not elsewhere classified: Secondary | ICD-10-CM

## 2021-02-07 DIAGNOSIS — M6281 Muscle weakness (generalized): Secondary | ICD-10-CM

## 2021-02-07 DIAGNOSIS — M25622 Stiffness of left elbow, not elsewhere classified: Secondary | ICD-10-CM

## 2021-02-07 NOTE — Therapy (Signed)
[facility] [address] [place], [address] Phone: [phone]   Fax:  [phone]  Occupational Therapy Treatment  Patient Details  Name: Collin Brown MRN: [id number] Date of Birth: [birth year] Referring Provider (OT): Dr Vladimir Creeks,   Encounter Date: 02/07/2021   OT End of Session - 02/07/21 1829     Visit Number 9    Number of Visits 24    Date for OT Re-Evaluation 03/25/21    OT Start Time 1450    OT Stop Time 1536    OT Time Calculation (min) 46 min    Activity Tolerance Patient tolerated treatment well    Behavior During Therapy [facility] for tasks assessed/performed             Past Medical History:  Diagnosis Date   Acute blood loss anemia    Critical illness myopathy    Diabetes mellitus without complication (HCC)    Dyspnea 08/23/2020   Hearing loss in right ear    Hypertension    Pneumonia due to COVID-19 virus 05/04/2020   Sleep apnea     Past Surgical History:  Procedure Laterality Date   TONSILLECTOMY     WOUND DEBRIDEMENT N/A 06/29/2020   Procedure: DEBRIDEMENT SACRAL WOUND;  Surgeon: Axel Filler, MD;  Location: [facility] OR;  Service: General;  Laterality: N/A;    There were no vitals filed for this visit.   Subjective Assessment - 02/07/21 1826     Subjective  Pt report seen neurosurgeon and MRI results - appear need to have surgery for deltoid - no activity in deltoid - schedule with shoulder specialist plan and then hand surgeon for possilbe capsule release of MC - do show more motion distal from elbow to hand -but not shoulder - pt cont to be motivated but admit was little disappointed but doing better now    Pertinent History Collin Brown is a 35 y.o. male who unfortunately contracted COVID in September 2021. He was ventilated for prolonged period of time and went in recovery notice he had bilateral arm weakness. His right upper extremity has since resolved with  exception of some residual numbness and tingling in his fourth and fifth digit however he has noticed little improvement to his left upper extremity. He has had increased shoulder and elbow movement over the last couple of months, but still overall little improvement compared to original baseline. He has been undergoing physical and occupational therapy and his pain has decreased over time. Detailed physical examination is above. However patient does have dependent edema in his left hand as well as a significant left wrist and fingers contracture. He is tender to palpation the proximal aspect of his left arm and does have anhidrosis noted with significant amount of dry skin on his left forearm. 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 C7-T1 is working, but very difficult to assess due to severe contractures and frozen joints. Would like the patient to undergo evaluation from orthopedic hand surgeon. At this point his bicep strength would be very hard to beat with a nerve transfer for elbow flexion, his main limitation in this regard is an elbow extension contracture. However possible consideration for radial to axillary nerve transfer and spinal accessory to suprascapular nerve transfer for increased movement of his shoulder depending on his progress. Potential options were discussed with the patient and  his mother. We would like patient to see occupational therapist who specializes in brachial plexus injuries. We would like to see him back in clinic in approximately 2 months for reevaluation and progress. He was encouraged to reach out to [place] in the meantime for any questions or concerns.    Patient Stated Goals Want to be able to use my L hand to bath, lift or grip , play games, put my belt on , pick up boxes and do my job    Currently in Pain? Yes    Pain Score 1     Pain Orientation Left    Pain Descriptors  / Indicators Aching;Tightness;Burning    Pain Type Acute pain    Pain Onset More than a month ago    Pain Frequency Intermittent    Aggravating Factors  PROM of 4th digit             Pt unable to keep tubigrip with velcro band on upper arm  - kept rolling down   done soft tissue mobs to [facility] and CT spreads- webspace stretch 10 reps each    Compression glove most all the time -tubigrip D cont on hand to elbow     Pt to cont with knuckle bender splint for 2 -3 x 2 min -  increase to 6 4 bands on radial side and 6 on ulnar side for slight pull and can do PROM for composite flexion or PIP while in splint - 2 x  2 min  5/10 pain with PROM of 4th and 5th PIP  AAROM for MC flexion , intrinsic fist  And  Composite flexion to blue foam block - and squeezing - place and hold 4th and 5th -   Teal putty done and add to HEP for lat grip and 3 point grip - 2 x 12 reps Pain free  Supination stretch prior done on table 10 reps  acewrap  done to digits around 2 lbs - on table done 2 lbs - compensate with elbow extention for pronation - and elevation of shoulder 5/10 pain in 4th and 5th digit - with wrap into flexion   Cont wrist splint or Benik wrist wrap for wrist - during gripping - to compensate with wrist flexion during pinch and grip Pt report can hold or carry water bottle , phone or bag to put light groceries in at work      Thumb PA and RA  AAROM  Composite flexion of thumb 10 reps  This date could do opposition to 2nd  and 3rd digits Rubberband for thumb RA - on table slides 12 reps- add to HEP    Wrist extention - table slides 20 reps Place and hold wrist extention -10 reps AROM with YTB for extention - 10 reps  Shoulder AAROM on ball for shoulder flexion , scaption - palm down and in neutral position - 20 reps each                              OT Treatments/Exercises (OP) - 02/07/21 0001       Moist Heat Therapy   Number Minutes Moist Heat 5 Minutes     Moist Heat Location Hand   prior to PROM on hand                   OT Education - 02/07/21 1829     Education Details progress and HEP review  and changes    Person(s) Educated Patient    Methods Explanation;Demonstration;Tactile cues;Verbal cues;Handout    Comprehension Verbal cues required;Returned demonstration;Verbalized understanding              OT Short Term Goals - 12/31/20 2024       OT SHORT TERM GOAL #1   Title Pt and mom to be independent in HEP to decrease edema and increase AROM in forearm to digits    Baseline no lknowledge- edema increase digits to wrist , AROM decrease in wrist , sup , digits flexion and thumb - see lfowsheet    Time 4    Period Weeks    Status New    Target Date 01/28/21               OT Long Term Goals - 12/31/20 2026       OT LONG TERM GOAL #1   Title L wrist AROM increase by 15-20 degrees for sup, extention, flexion , UD to carry or hold objects on palm and push up from chair    Baseline wrist -25 ext, flexion 52, UD 5, sup 45    Time 6    Period Weeks    Status New    Target Date 02/11/21      OT LONG TERM GOAL #2   Title L digits flexion increase by more than 20 degrees in MC and PIP to hold 2 inch cylinder objects in ADL's and at work    Baseline MC flexion 25-30 degrees, PIP's 30-45 degrees - no grip    Time 6    Period Weeks    Status New    Target Date 02/11/21      OT LONG TERM GOAL #3   Title L digits AROM increase for pt to touch palm to use in 25% of ADL's    Baseline see goal 2 for base line - no grip -and no using hand in any ADL's    Time 12    Period Weeks    Status New    Target Date 03/25/21      OT LONG TERM GOAL #4   Title update HEP for strengthening and proximal ROM as progress    Time 6    Period Weeks    Status New    Target Date 02/11/21                   Plan - 02/07/21 1830     Clinical Impression Statement Mr. Altizer has likely suffered from an upper trunk brachial  plexus injury. C7-T1 during his COVID that he was hospitalize for from Sept thru Dec - and then had [facility] -and return back to work about 6 wks  - able to use his hand and elbow somewhat at work  - pt present at [employer] eval  with very little shoulder AROM , elbow flexion to chest 2+/5, extention 3+/5, but increase edema and flexor contractures in forearm sup, wrist in all planes and digits ROM. Pt making weekly progress in elbow flexion, extention sup, wrist extenion , digits flexion- slow but steady- 5th digit and ulnar side of hand pt report more feeling in - focus on digits PROM, wrist extention , thumb RA - adding rubber band for resistance for both - grip and pinch grip -add putty and 2 lbs for supination  but hand wrapped with acewrap -  pt very motivated - Pt can benefit from OT services to decrease edema, stiffness and  pain -and increase ROM and strength in L UE . This date pt going to be refer by neurosurgeon to shoulder specialist and hand surgeon for possible surgery    OT Occupational Profile and History Problem Focused Assessment - Including review of records relating to presenting problem    Occupational performance deficits (Please refer to evaluation for details): ADL's;IADL's;Work;Play;Leisure;Social Participation    Body Structure / Function / Physical Skills ADL;Coordination;Flexibility;Edema;Dexterity;IADL;UE functional use;Strength;Decreased knowledge of use of DME;FMC;Pain    Rehab Potential Fair    Clinical Decision Making Several treatment options, min-mod task modification necessary    Comorbidities Affecting Occupational Performance: May have comorbidities impacting occupational performance    Modification or Assistance to Complete Evaluation  Min-Moderate modification of tasks or assist with assess necessary to complete eval    OT Frequency 2x / week    OT Duration 12 weeks    OT Treatment/Interventions Self-care/ADL training;Paraffin;Moist Heat;Fluidtherapy;Contrast Bath;Therapeutic  exercise;Manual Therapy;Patient/family education;Passive range of motion;Splinting;DME and/or AE instruction    Consulted and Agree with Plan of Care Patient             Patient will benefit from skilled therapeutic intervention in order to improve the following deficits and impairments:   Body Structure / Function / Physical Skills: ADL, Coordination, Flexibility, Edema, Dexterity, IADL, UE functional use, Strength, Decreased knowledge of use of DME, FMC, Pain       Visit Diagnosis: Stiffness of left hand, not elsewhere classified  Stiffness of left elbow, not elsewhere classified  Localized edema  Muscle weakness (generalized)  Stiffness of left shoulder, not elsewhere classified  Stiffness of left wrist, not elsewhere classified    Problem List Patient Active Problem List   Diagnosis Date Noted   Lymphedema of arm 12/11/2020   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   Sleeps in sitting position due to orthopnea 10/02/2020   Obesity with alveolar hypoventilation and body mass index (BMI) of 40 or greater (HCC) 10/02/2020   Chronic intermittent hypoxia with obstructive sleep apnea 10/02/2020   Pressure ulcer of sacral region, stage 4 (HCC) 09/05/2020   Diabetes mellitus (HCC) 09/05/2020   Dyspnea 08/23/2020   Drug induced constipation    Hypokalemia    COVID-19 long hauler manifesting chronic decreased mobility and endurance 06/17/2020   Debility 06/08/2020   Critical illness myopathy    Supplemental oxygen dependent    Essential hypertension    Brachial plexopathy    Neuropathic pain    Sleep apnea     Oletta CohnuPreez, Maryan Sivak OTR/L,CLT 02/07/2021, 7:00 PM  [facility] [address] [place], [place], [address] Phone: [phone]   Fax:  [phone]  Name: Julaine HuaMatthew M Genet MRN: [id number] Date of Birth: 06-23-1986

## 2021-02-14 ENCOUNTER — Other Ambulatory Visit: Payer: Self-pay

## 2021-02-14 ENCOUNTER — Ambulatory Visit: Payer: BC Managed Care – PPO | Attending: Family Medicine | Admitting: Occupational Therapy

## 2021-02-14 ENCOUNTER — Encounter (HOSPITAL_BASED_OUTPATIENT_CLINIC_OR_DEPARTMENT_OTHER): Payer: BC Managed Care – PPO | Attending: Internal Medicine | Admitting: Internal Medicine

## 2021-02-14 DIAGNOSIS — M25622 Stiffness of left elbow, not elsewhere classified: Secondary | ICD-10-CM | POA: Diagnosis present

## 2021-02-14 DIAGNOSIS — E1151 Type 2 diabetes mellitus with diabetic peripheral angiopathy without gangrene: Secondary | ICD-10-CM | POA: Diagnosis not present

## 2021-02-14 DIAGNOSIS — M25612 Stiffness of left shoulder, not elsewhere classified: Secondary | ICD-10-CM | POA: Insufficient documentation

## 2021-02-14 DIAGNOSIS — E11622 Type 2 diabetes mellitus with other skin ulcer: Secondary | ICD-10-CM | POA: Diagnosis present

## 2021-02-14 DIAGNOSIS — Z6841 Body Mass Index (BMI) 40.0 and over, adult: Secondary | ICD-10-CM | POA: Diagnosis not present

## 2021-02-14 DIAGNOSIS — L89154 Pressure ulcer of sacral region, stage 4: Secondary | ICD-10-CM | POA: Diagnosis not present

## 2021-02-14 DIAGNOSIS — M25642 Stiffness of left hand, not elsewhere classified: Secondary | ICD-10-CM | POA: Insufficient documentation

## 2021-02-14 DIAGNOSIS — M25632 Stiffness of left wrist, not elsewhere classified: Secondary | ICD-10-CM | POA: Diagnosis present

## 2021-02-14 DIAGNOSIS — R600 Localized edema: Secondary | ICD-10-CM | POA: Diagnosis present

## 2021-02-14 DIAGNOSIS — Z794 Long term (current) use of insulin: Secondary | ICD-10-CM | POA: Insufficient documentation

## 2021-02-14 DIAGNOSIS — G7281 Critical illness myopathy: Secondary | ICD-10-CM | POA: Diagnosis not present

## 2021-02-14 DIAGNOSIS — M6281 Muscle weakness (generalized): Secondary | ICD-10-CM | POA: Insufficient documentation

## 2021-02-14 NOTE — Therapy (Signed)
[facility] [address], [place], [address] Phone: [phone]   Fax:  [phone]  Occupational Therapy Treatment/10th visit from 12/31/20 to 02/14/21  Patient Details  Name: Collin Brown MRN: [id number] Date of Birth: Dec 04, 1985 Referring Provider (OT): Dr Vladimir Creeks,   Encounter Date: 02/14/2021   OT End of Session - 02/14/21 1722     Visit Number 10    Number of Visits 24    Date for OT Re-Evaluation 03/25/21    OT Start Time 1615    OT Stop Time 1719    OT Time Calculation (min) 64 min    Activity Tolerance Patient tolerated treatment well    Behavior During Therapy [facility] for tasks assessed/performed             Past Medical History:  Diagnosis Date   Acute blood loss anemia    Critical illness myopathy    Diabetes mellitus without complication (HCC)    Dyspnea 08/23/2020   Hearing loss in right ear    Hypertension    Pneumonia due to COVID-19 virus 05/04/2020   Sleep apnea     Past Surgical History:  Procedure Laterality Date   TONSILLECTOMY     WOUND DEBRIDEMENT N/A 06/29/2020   Procedure: DEBRIDEMENT SACRAL WOUND;  Surgeon: Axel Filler, MD;  Location: [facility] OR;  Service: General;  Laterality: N/A;    There were no vitals filed for this visit.   Subjective Assessment - 02/14/21 1721     Subjective  I did not hear back from any appt with shoulder and hand surgeon - still waiting - did get to ring the bell at the wound center    Pertinent History Collin Brown is a 35 y.o. male who unfortunately contracted COVID in September 2021. He was ventilated for prolonged period of time and went in recovery notice he had bilateral arm weakness. His right upper extremity has since resolved with exception of some residual numbness and tingling in his fourth and fifth digit however he has noticed little improvement to his left upper extremity. He has had increased shoulder and elbow movement  over the last couple of months, but still overall little improvement compared to original baseline. He has been undergoing physical and occupational therapy and his pain has decreased over time. Detailed physical examination is above. However patient does have dependent edema in his left hand as well as a significant left wrist and fingers contracture. He is tender to palpation the proximal aspect of his left arm and does have anhidrosis noted with significant amount of dry skin on his left forearm. 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 C7-T1 is working, but very difficult to assess due to severe contractures and frozen joints. Would like the patient to undergo evaluation from orthopedic hand surgeon. At this point his bicep strength would be very hard to beat with a nerve transfer for elbow flexion, his main limitation in this regard is an elbow extension contracture. However possible consideration for radial to axillary nerve transfer and spinal accessory to suprascapular nerve transfer for increased movement of his shoulder depending on his progress. Potential options were discussed with the patient and his mother. We would like patient to see occupational therapist who specializes in brachial plexus injuries. We would like to see him back in clinic in approximately 2 months for reevaluation and  progress. He was encouraged to reach out to [place] in the meantime for any questions or concerns.    Patient Stated Goals Want to be able to use my L hand to bath, lift or grip , play games, put my belt on , pick up boxes and do my job    Currently in Pain? Yes    Pain Score 1     Pain Location Shoulder    Pain Orientation Left    Pain Descriptors / Indicators Aching    Pain Type Chronic pain    Pain Frequency Intermittent                Pt made great progress since SOC - wrist extenion -25 and now 15   Digits 2nd and 3rd flexion was 30  MC's and 40-45 for PIP's NOW  35 and 45 MC's and 60-80 for PIP's  Thumb could not do lat and 3 point grip  and now Grip 12 lbs ,lat grip 7 lbs  and 3 point grip 5 lbs   Elbow flexion - do carry object at work  And extention of elbow- pushing on biodex 20 lbs bilateral and push up from chair Able to bounce ball L and R using wrist and elbow -  Shoulder ext 55, flexion 70 -but some pain when going to high rolling ball on table or incline  Was able to do UBE this date 5 min BW and FW - using grip on L about 1 1/2 min and then again 30 sec   PROM and prolonged flexion stretch to MC's and PIP 'ad thumb PA  and RA  Followed by AROM and place and hold Blue foam block - 3 point pinch , lat grip -and composite attempt Biodex done 20 lbs elbow extention 2x 15 reps Rowing 20 lbs- but to neutral to past body - scapula only 2x 15 reps- pain free GOT SOB after UBE , Biodex and bouncing ball- water break -and 3 rest breaks                  OT Education - 02/14/21 1722     Education Details progress and HEP review and changes    Person(s) Educated Patient    Methods Explanation;Demonstration;Tactile cues;Verbal cues;Handout    Comprehension Verbal cues required;Returned demonstration;Verbalized understanding              OT Short Term Goals - 12/31/20 2024       OT SHORT TERM GOAL #1   Title Pt and mom to be independent in HEP to decrease edema and increase AROM in forearm to digits    Baseline no lknowledge- edema increase digits to wrist , AROM decrease in wrist , sup , digits flexion and thumb - see lfowsheet    Time 4    Period Weeks    Status New    Target Date 01/28/21               OT Long Term Goals - 12/31/20 2026       OT LONG TERM GOAL #1   Title L wrist AROM increase by 15-20 degrees for sup, extention, flexion , UD to carry or hold objects on palm and push up from chair    Baseline wrist -25 ext, flexion 52, UD 5,  sup 45    Time 6    Period Weeks    Status New    Target Date 02/11/21      OT LONG TERM  GOAL #2   Title L digits flexion increase by more than 20 degrees in MC and PIP to hold 2 inch cylinder objects in ADL's and at work    Baseline MC flexion 25-30 degrees, PIP's 30-45 degrees - no grip    Time 6    Period Weeks    Status New    Target Date 02/11/21      OT LONG TERM GOAL #3   Title L digits AROM increase for pt to touch palm to use in 25% of ADL's    Baseline see goal 2 for base line - no grip -and no using hand in any ADL's    Time 12    Period Weeks    Status New    Target Date 03/25/21      OT LONG TERM GOAL #4   Title update HEP for strengthening and proximal ROM as progress    Time 6    Period Weeks    Status New    Target Date 02/11/21                   Plan - 02/14/21 1723     Clinical Impression Statement Collin 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 C7-T1 during his COVID that he was hospitalize for from Sept thru Dec - and then had [facility] -and return back to work about 6 wks  - able to use his hand and elbow somewhat at work  - pt present at [employer] eval  with very little shoulder AROM , elbow flexion to chest 2+/5, extention 3+/5, but increase edema and flexor contractures in forearm sup, wrist in all planes and digits ROM. Pt making weekly progress in elbow flexion able to carry some objects at work , on BIodex done 20 lbs bilateral for elbow extention. Wrist extention this date 15 degrees and pt to do table slides for stretch - show digits flexion mostly i 2nd ad 3rd -but very little in 4th and 5th- cont to focus on digits flexiion PROM, wrist extention , thumb RA -  grip and pinch grip  -  pt very motivated - Pt can benefit from OT services to decrease edema, stiffness and pain -and increase ROM and strength in L UE . Pt awaiting to be refer by neurosurgeon to shoulder specialist and hand surgeon for possible surgery    OT Occupational  Profile and History Problem Focused Assessment - Including review of records relating to presenting problem    Occupational performance deficits (Please refer to evaluation for details): ADL's;IADL's;Work;Play;Leisure;Social Participation    Body Structure / Function / Physical Skills ADL;Coordination;Flexibility;Edema;Dexterity;IADL;UE functional use;Strength;Decreased knowledge of use of DME;FMC;Pain    Rehab Potential Fair    Clinical Decision Making Several treatment options, min-mod task modification necessary    Comorbidities Affecting Occupational Performance: May have comorbidities impacting occupational performance    Modification or Assistance to Complete Evaluation  Min-Moderate modification of tasks or assist with assess necessary to complete eval    OT Frequency 2x / week    OT Duration 12 weeks    OT Treatment/Interventions Self-care/ADL training;Paraffin;Moist Heat;Fluidtherapy;Contrast Bath;Therapeutic exercise;Manual Therapy;Patient/family education;Passive range of motion;Splinting;DME and/or AE instruction    Consulted and Agree with Plan of Care Patient             Patient will benefit from skilled therapeutic intervention in order to improve the following deficits and impairments:   Body Structure / Function / Physical Skills: ADL, Coordination, Flexibility, Edema,  Dexterity, IADL, UE functional use, Strength, Decreased knowledge of use of DME, FMC, Pain       Visit Diagnosis: Stiffness of left hand, not elsewhere classified  Stiffness of left elbow, not elsewhere classified  Localized edema  Muscle weakness (generalized)  Stiffness of left shoulder, not elsewhere classified  Stiffness of left wrist, not elsewhere classified    Problem List Patient Active Problem List   Diagnosis Date Noted   Lymphedema of arm 12/11/2020   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   Sleeps in sitting position due to orthopnea 10/02/2020   Obesity with alveolar  hypoventilation and body mass index (BMI) of 40 or greater (HCC) 10/02/2020   Chronic intermittent hypoxia with obstructive sleep apnea 10/02/2020   Pressure ulcer of sacral region, stage 4 (HCC) 09/05/2020   Diabetes mellitus (HCC) 09/05/2020   Dyspnea 08/23/2020   Drug induced constipation    Hypokalemia    COVID-19 long hauler manifesting chronic decreased mobility and endurance 06/17/2020   Debility 06/08/2020   Critical illness myopathy    Supplemental oxygen dependent    Essential hypertension    Brachial plexopathy    Neuropathic pain    Sleep apnea     Oletta CohnuPreez, Clarrisa Kaylor OTR/L,CLT 02/14/2021, 5:29 PM  [facility] [address] [place], [place], [address] Phone: [phone]   Fax:  [phone]  Name: Julaine HuaMatthew M Foland MRN: [id number] Date of Birth: 1985/12/08
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
## 2021-02-15 NOTE — Progress Notes (Signed)
MITSUGI, SCHRADER ([id number]) Visit Report for 02/14/2021 Arrival Information Details Patient Name: Date of Service: Collin Brown, Collin M. 02/14/2021 8:00 A M Medical Record Number: [id number] Patient Account Number: [id number] Date of Birth/Sex: Treating RN: 1985/09/10 (35 y.o. Charlean Merl, [name] Keyaan Lederman: Shirlee Latch Other Clinician: Referring Darcell Sabino: Treating Didier Brandenburg/Extender: Rise Paganini in Treatment: 26 Visit Information History Since Last Visit Added or deleted any medications: No Patient Arrived: Ambulatory Any new allergies or adverse reactions: No Arrival Time: 08:27 Had a fall or experienced change in No Accompanied By: mom activities of daily living that may affect Transfer Assistance: None risk of falls: Patient Identification Verified: Yes Signs or symptoms of abuse/neglect since last visito No Secondary Verification Process Completed: Yes Hospitalized since last visit: No Patient Requires Transmission-Based Precautions: No Implantable device outside of the clinic excluding No Patient Has Alerts: No cellular tissue based products placed in the center since last visit: Has Dressing in Place as Prescribed: Yes Pain Present Now: Yes Electronic Signature(s) Signed: 02/15/2021 5:38:08 PM By: Fonnie Mu RN Entered By: Fonnie Mu on 02/14/2021 08:28:04 -------------------------------------------------------------------------------- Clinic Level of Care Assessment Details Patient Name: Date of Service: Collin Calamity, MA Collin M. 02/14/2021 8:00 A M Medical Record Number: [id number] Patient Account Number: [id number] Date of Birth/Sex: Treating RN: 1986-06-27 (35 y.o. Collin Brown Primary Care Teneil Shiller: Shirlee Latch Other Clinician: Referring Huey Scalia: Treating Breah Joa/Extender: Rise Paganini in Treatment: 26 Clinic Level of Care Assessment Items TOOL 4 Quantity Score X- 1  0 Use when only an EandM is performed on FOLLOW-UP visit ASSESSMENTS - Nursing Assessment / Reassessment X- 1 10 Reassessment of Co-morbidities (includes updates in patient status) X- 1 5 Reassessment of Adherence to Treatment Plan ASSESSMENTS - Wound and Skin A ssessment / Reassessment X - Simple Wound Assessment / Reassessment - one wound 1 5 []  - 0 Complex Wound Assessment / Reassessment - multiple wounds X- 1 10 Dermatologic / Skin Assessment (not related to wound area) ASSESSMENTS - Focused Assessment []  - 0 Circumferential Edema Measurements - multi extremities X- 1 10 Nutritional Assessment / Counseling / Intervention []  - 0 Lower Extremity Assessment (monofilament, tuning fork, pulses) []  - 0 Peripheral Arterial Disease Assessment (using hand held doppler) ASSESSMENTS - Ostomy and/or Continence Assessment and Care []  - 0 Incontinence Assessment and Management []  - 0 Ostomy Care Assessment and Management (repouching, etc.) PROCESS - Coordination of Care X - Simple Patient / Family Education for ongoing care 1 15 []  - 0 Complex (extensive) Patient / Family Education for ongoing care X- 1 10 Staff obtains Consents, Records, T Results / Process Orders est []  - 0 Staff telephones HHA, Nursing Homes / Clarify orders / etc []  - 0 Routine Transfer to another Facility (non-emergent condition) []  - 0 Routine Hospital Admission (non-emergent condition) []  - 0 New Admissions / / Ordering NPWT Apligraf, etc. , []  - 0 Emergency Hospital Admission (emergent condition) X- 1 10 Simple Discharge Coordination []  - 0 Complex (extensive) Discharge Coordination PROCESS - Special Needs []  - 0 Pediatric / Minor Patient Management []  - 0 Isolation Patient Management []  - 0 Hearing / Language / Visual special needs []  - 0 Assessment of Community assistance (transportation, D/C planning, etc.) []  - 0 Additional assistance / Altered mentation []  -  0 Support Surface(s) Assessment (bed, cushion, seat, etc.) INTERVENTIONS - Wound Cleansing / Measurement X - Simple Wound Cleansing - one wound 1 5 []  - 0 Complex Wound Cleansing -  multiple wounds X- 1 5 Wound Imaging (photographs - any number of wounds) []  - 0 Wound Tracing (instead of photographs) X- 1 5 Simple Wound Measurement - one wound []  - 0 Complex Wound Measurement - multiple wounds INTERVENTIONS - Wound Dressings []  - 0 Small Wound Dressing one or multiple wounds []  - 0 Medium Wound Dressing one or multiple wounds []  - 0 Large Wound Dressing one or multiple wounds []  - 0 Application of Medications - topical []  - 0 Application of Medications - injection INTERVENTIONS - Miscellaneous []  - 0 External ear exam []  - 0 Specimen Collection (cultures, biopsies, blood, body fluids, etc.) []  - 0 Specimen(s) / Culture(s) sent or taken to Lab for analysis []  - 0 Patient Transfer (multiple staff / / Similar devices) []  - 0 Simple Staple / Suture removal (25 or less) []  - 0 Complex Staple / Suture removal (26 or more) []  - 0 Hypo / Hyperglycemic Management (close monitor of Blood Glucose) []  - 0 Ankle / Brachial Index (ABI) - do not check if billed separately X- 1 5 Vital Signs Has the patient been seen at the hospital within the last three years: Yes Total Score: 95 Level Of Care: New/Established - Level 3 Electronic Signature(s) Signed: 02/14/2021 6:24:11 PM By: Entered By: on 02/14/2021 08:42:35 -------------------------------------------------------------------------------- Encounter Discharge Information Details Patient Name: Date of Service: , MA Collin M. 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, adult Primary Care Brandin Stetzer: Other Clinician: Referring Danilynn Jemison: Treating Palmyra Rogacki/Extender:  in Treatment: 26 Encounter Discharge Information Items Discharge Condition: Stable Ambulatory Status: Ambulatory Discharge Destination: Home Transportation: Private Auto Accompanied By: mother Schedule Follow-up Appointment: No Clinical Summary of Care: Electronic Signature(s) Signed: 02/14/2021 6:24:11 PM By: 04/17/2021 Entered By: Collin Brown on 02/14/2021 08:43:08 -------------------------------------------------------------------------------- Lower Extremity Assessment Details Patient Name: Date of Service: 04/17/2021, MA Collin M. 02/14/2021 8:00 A M Medical Record Number: 04/17/2021 Patient Account Number: [id number] Date of Birth/Sex: Treating RN: 04-Jan-1986 (35 y.o. 20 Primary Care Randel Hargens: Collin Brown Other Clinician: Referring Savahanna Almendariz: Treating Juwana Thoreson/Extender: Shirlee Latch in Treatment: 26 Electronic Signature(s) Signed: 02/15/2021 5:38:08 PM By: 27 RN Entered By: 04/17/2021 on 02/14/2021 08:32:00 -------------------------------------------------------------------------------- Multi Wound Chart Details Patient Name: Date of Service: Collin Stall, MA Collin M. 02/14/2021 8:00 A M Medical Record Number: Collin Brown Patient Account Number: 04/17/2021 Date of Birth/Sex: Treating RN: 1986/04/21 (35 y.o. [id number] Primary Care Sabatino Williard: 11/06/1985 Other Clinician: Referring Janaiya Beauchesne: Treating Travin Marik/Extender: 20 in Treatment: 26 Vital Signs Height(in): 70 Pulse(bpm): 75 Weight(lbs): 341 Blood Pressure(mmHg): 117/84 Body Mass Index(BMI): 49 Temperature(F): 97.7 Respiratory Rate(breaths/min): 17 Photos: [1:No Photos Sacrum] [N/A:N/A N/A] Wound Location: [1:Pressure Injury] [N/A:N/A] Wounding Event: [1:Pressure Ulcer] [N/A:N/A] Primary Etiology: [1:05/18/2020] [N/A:N/A] Date Acquired: [1:26]  [N/A:N/A] Weeks of Treatment: [1:Healed - Epithelialized] [N/A:N/A] Wound Status: [1:0x0x0] [N/A:N/A] Measurements L x W x D (cm) [1:0] [N/A:N/A] A (cm) : rea [1:0] [N/A:N/A] Volume (cm) : [1:100.00%] [N/A:N/A] % Reduction in A rea: [1:100.00%] [N/A:N/A] % Reduction in Volume: [1:Category/Stage IV] [N/A:N/A] Treatment Notes Electronic Signature(s) Signed: 02/14/2021 6:24:11 PM By: Rise Paganini Signed: 02/14/2021 8:35:52 PM By: Fonnie Mu MD Entered By: Fonnie Mu on 02/14/2021 09:21:11 -------------------------------------------------------------------------------- Multi-Disciplinary Care Plan Details Patient Name: Date of Service: Collin Calamity, MA Collin M. 02/14/2021 8:00 A M Medical Record Number: [id number]  Patient Account Number: [id number] Date of Birth/Sex: Treating RN: 08-12-1985 (35 y.o. Collin Brown Primary Care Charitie Hinote: Shirlee Latch Other Clinician: Referring Girolamo Lortie: Treating Lennix Rotundo/Extender: Rise Paganini in Treatment: 26 Multidisciplinary Care Plan reviewed with physician Active Inactive Electronic Signature(s) Signed: 02/14/2021 6:24:11 PM By: Collin Brown Entered By: Collin Brown on 02/14/2021 08:41:12 -------------------------------------------------------------------------------- Pain Assessment Details Patient Name: Date of Service: Collin Calamity, MA Collin M. 02/14/2021 8:00 A M Medical Record Number: [id number] Patient Account Number: [id number] Date of Birth/Sex: Treating RN: Apr 20, 1986 (35 y.o. Charlean Merl, [name] Nakya Weyand: Shirlee Latch Other Clinician: Referring Ellarae Nevitt: Treating Rushi Chasen/Extender: Rise Paganini in Treatment: 26 Active Problems Location of Pain Severity and Description of Pain Patient Has Paino No Site Locations Pain Management and Medication Current Pain Management: Electronic Signature(s) Signed: 02/15/2021 5:38:08 PM By: Fonnie Mu  RN Entered By: Fonnie Mu on 02/14/2021 08:30:48 -------------------------------------------------------------------------------- Patient/Caregiver Education Details Patient Name: Date of Service: Collin Calamity, MA Collin M. 7/7/2022andnbsp8:00 A M Medical Record Number: [id number] Patient Account Number: [id number] Date of Birth/Gender: Treating RN: May 13, 1986 (34 y.o. Collin Brown Primary Care Physician: Shirlee Latch Other Clinician: Referring Physician: Treating Physician/Extender: Rise Paganini in Treatment: 26 Education Assessment Education Provided To: Patient Education Topics Provided Wound/Skin Impairment: Handouts: Skin Care Do's and Dont's Methods: Explain/Verbal Responses: Reinforcements needed Electronic Signature(s) Signed: 02/14/2021 6:24:11 PM By: Collin Brown Entered By: Collin Brown on 02/14/2021 08:41:22 -------------------------------------------------------------------------------- Wound Assessment Details Patient Name: Date of Service: Collin Calamity, MA Collin M. 02/14/2021 8:00 A M Medical Record Number: [id number] Patient Account Number: [id number] Date of Birth/Sex: Treating RN: 01-Nov-1985 (35 y.o. Charlean Merl, [name] Camelia Stelzner: Shirlee Latch Other Clinician: Referring Lenward Able: Treating Beva Remund/Extender: Rise Paganini in Treatment: 26 Wound Status Wound Number: 1 Primary Etiology: Pressure Ulcer Wound Location: Sacrum Wound Status: Healed - Epithelialized Wounding Event: Pressure Injury Comorbid History: Anemia, Sleep Apnea, Hypertension, Type II Diabetes Date Acquired: 05/18/2020 Weeks Of Treatment: 26 Clustered Wound: No Photos Wound Measurements Length: (cm) Width: (cm) Depth: (cm) Area: (cm) Volume: (cm) 0 % Reduction in Area: 100% 0 % Reduction in Volume: 100% 0 Epithelialization: Large (67-100%) 0 0 Wound Description Classification: Category/Stage  IV Wound Margin: Well defined, not attached Exudate Amount: None Present Foul Odor After Cleansing: No Slough/Fibrino No Wound Bed Granulation Amount: None Present (0%) Exposed Structure Necrotic Amount: None Present (0%) Fascia Exposed: No Fat Layer (Subcutaneous Tissue) Exposed: No Tendon Exposed: No Muscle Exposed: No Joint Exposed: No Bone Exposed: No Electronic Signature(s) Signed: 02/15/2021 5:16:50 PM By: Karl Ito Signed: 02/15/2021 5:38:08 PM By: Fonnie Mu RN Entered By: Karl Ito on 02/15/2021 15:16:56 -------------------------------------------------------------------------------- Vitals Details Patient Name: Date of Service: Collin Calamity, MA Collin M. 02/14/2021 8:00 A M Medical Record Number: [id number] Patient Account Number: [id number] Date of Birth/Sex: Treating RN: Jun 29, 1986 (35 y.o. Charlean Merl, [name] Amanada Philbrick: Shirlee Latch Other Clinician: Referring Yacine Garriga: Treating Perry Brucato/Extender: Rise Paganini in Treatment: 26 Vital Signs Time Taken: 08:28 Temperature (F): 97.7 Height (in): 70 Pulse (bpm): 75 Weight (lbs): 341 Respiratory Rate (breaths/min): 17 Body Mass Index (BMI): 48.9 Blood Pressure (mmHg): 117/84 Reference Range: 80 - 120 mg / dl Electronic Signature(s) Signed: 02/15/2021 5:38:08 PM By: Fonnie Mu RN Entered By: Fonnie Mu on 02/14/2021 96:78:93

## 2021-02-18 ENCOUNTER — Ambulatory Visit: Payer: BC Managed Care – PPO | Admitting: Occupational Therapy

## 2021-02-18 DIAGNOSIS — M6281 Muscle weakness (generalized): Secondary | ICD-10-CM

## 2021-02-18 DIAGNOSIS — M25632 Stiffness of left wrist, not elsewhere classified: Secondary | ICD-10-CM

## 2021-02-18 DIAGNOSIS — M25642 Stiffness of left hand, not elsewhere classified: Secondary | ICD-10-CM | POA: Diagnosis not present

## 2021-02-18 DIAGNOSIS — M25612 Stiffness of left shoulder, not elsewhere classified: Secondary | ICD-10-CM

## 2021-02-18 DIAGNOSIS — R600 Localized edema: Secondary | ICD-10-CM

## 2021-02-18 DIAGNOSIS — M25622 Stiffness of left elbow, not elsewhere classified: Secondary | ICD-10-CM

## 2021-02-18 NOTE — Therapy (Signed)
[facility] [address], [place], [address] Phone: [phone]   Fax:  [phone]  Occupational Therapy Treatment  Patient Details  Name: ODES LOLLI MRN: [id number] Date of Birth: 1985-11-24 Referring Provider (OT): Dr Vladimir Creeks,   Encounter Date: 02/18/2021   OT End of Session - 02/18/21 0926     Visit Number 11    Number of Visits 24    Date for OT Re-Evaluation 03/25/21    OT Start Time 0908    OT Stop Time 0945    OT Time Calculation (min) 37 min    Activity Tolerance Patient tolerated treatment well    Behavior During Therapy [facility] for tasks assessed/performed             Past Medical History:  Diagnosis Date   Acute blood loss anemia    Critical illness myopathy    Diabetes mellitus without complication (HCC)    Dyspnea 08/23/2020   Hearing loss in right ear    Hypertension    Pneumonia due to COVID-19 virus 05/04/2020   Sleep apnea     Past Surgical History:  Procedure Laterality Date   TONSILLECTOMY     WOUND DEBRIDEMENT N/A 06/29/2020   Procedure: DEBRIDEMENT SACRAL WOUND;  Surgeon: Axel Filler, MD;  Location: [facility] OR;  Service: General;  Laterality: N/A;    There were no vitals filed for this visit.   Subjective Assessment - 02/18/21 0918     Subjective  I had the last 2 days more pain down my arm - from my shoulder down to my wrist- 2-5/10 just nagging pain - I hit my shoulder aganst closet at home on Sat am and since then    Pertinent History Collin Brown is a 35 y.o. male who unfortunately contracted COVID in September 2021. He was ventilated for prolonged period of time and went in recovery notice he had bilateral arm weakness. His right upper extremity has since resolved with exception of some residual numbness and tingling in his fourth and fifth digit however he has noticed little improvement to his left upper extremity. He has had increased shoulder and  elbow movement over the last couple of months, but still overall little improvement compared to original baseline. He has been undergoing physical and occupational therapy and his pain has decreased over time. Detailed physical examination is above. However patient does have dependent edema in his left hand as well as a significant left wrist and fingers contracture. He is tender to palpation the proximal aspect of his left arm and does have anhidrosis noted with significant amount of dry skin on his left forearm. 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 C7-T1 is working, but very difficult to assess due to severe contractures and frozen joints. Would like the patient to undergo evaluation from orthopedic hand surgeon. At this point his bicep strength would be very hard to beat with a nerve transfer for elbow flexion, his main limitation in this regard is an elbow extension contracture. However possible consideration for radial to axillary nerve transfer and spinal accessory to suprascapular nerve transfer for increased movement of his shoulder depending on his progress. Potential options were discussed with the patient and his mother. We would like patient to see occupational therapist who specializes in brachial plexus injuries. We would like to see him back in clinic in  approximately 2 months for reevaluation and progress. He was encouraged to reach out to us in the meantime for any questions or concerns.    Patient Stated Goals Want to be able to use my L hand to bath, lift or grip , play games, put my belt on , pick up boxes and do my job    Currently in Pain? Yes    Pain Score 2    increase to 5/10   Pain Location Arm    Pain Descriptors / Indicators Aching    Pain Type Acute pain    Pain Frequency Constant              Pt tender to touch pect, deltoids , and forearm - hand and wrist pain  free Pain report constant pain since Sat in arm 2-5/10  Pt with rounded shoulder - protraction and elevation  Pt to retract shoulder -and support if up and about the arm  Do some ice for pain over pect  He report he ran into closet in room with shoulder on Sat and since then arm hurt             OT Treatments/Exercises (OP) - 02/18/21 0001       LUE Contrast Bath   Time 11 minutes    Comments to decrease pain             done some soft tissue mobs and MC and CT spreads to L hand and digits Webspace soft tissue mobs  And PROM to digits flexion and thumb  And wrist extention  Pt to cont to do hand and wrist HEP  Mini massager done on deltoid and forearm - extensors  Light and gentle myofascial release done on forearm         OT Education - 02/18/21 0926     Education Details progress and HEP review and changes    Person(s) Educated Patient    Methods Explanation;Demonstration;Tactile cues;Verbal cues;Handout    Comprehension Verbal cues required;Returned demonstration;Verbalized understanding              OT Short Term Goals - 12/31/20 2024       OT SHORT TERM GOAL #1   Title Pt and mom to be independent in HEP to decrease edema and increase AROM in forearm to digits    Baseline no lknowledge- edema increase digits to wrist , AROM decrease in wrist , sup , digits flexion and thumb - see lfowsheet    Time 4    Period Weeks    Status New    Target Date 01/28/21               OT Long Term Goals - 12/31/20 2026       OT LONG TERM GOAL #1   Title L wrist AROM increase by 15-20 degrees for sup, extention, flexion , UD to carry or hold objects on palm and push up from chair    Baseline wrist -25 ext, flexion 52, UD 5, sup 45    Time 6    Period Weeks    Status New    Target Date 02/11/21      OT LONG TERM GOAL #2   Title L digits flexion increase by more than 20 degrees in MC and PIP to hold 2 inch cylinder objects in ADL's and at work     Baseline MC flexion 25-30 degrees, PIP's 30-45 degrees - no grip    Time 6  Period Weeks    Status New    Target Date 02/11/21      OT LONG TERM GOAL #3   Title L digits AROM increase for pt to touch palm to use in 25% of ADL's    Baseline see goal 2 for base line - no grip -and no using hand in any ADL's    Time 12    Period Weeks    Status New    Target Date 03/25/21      OT LONG TERM GOAL #4   Title update HEP for strengthening and proximal ROM as progress    Time 6    Period Weeks    Status New    Target Date 02/11/21                   Plan - 02/18/21 0926     Clinical Impression Statement Pt arrive this date with increase pain from shoulder to forearm 2-5/10 nagging , constant pain  - tender to touh over pect , upper traps, deltoid and forearm - contrast done and some soft tissue- pt ed on keeping retracted and support arm when standing -and sitting - pt with protracted shoulder and weight of arm pulling down on shoulder- do some ice and if he can take anti imflammatory for 2-3 days - and will reassess in 3 days - if at work support arm and do not try and pick up or push/pull objects- tolerated PROM to digits and hand - pt report he ran with shoulder into closet this weekend    OT Occupational Profile and History Problem Focused Assessment - Including review of records relating to presenting problem    Occupational performance deficits (Please refer to evaluation for details): ADL's;IADL's;Work;Play;Leisure;Social Participation    Body Structure / Function / Physical Skills ADL;Coordination;Flexibility;Edema;Dexterity;IADL;UE functional use;Strength;Decreased knowledge of use of DME;FMC;Pain    Rehab Potential Fair    Clinical Decision Making Several treatment options, min-mod task modification necessary    Comorbidities Affecting Occupational Performance: May have comorbidities impacting occupational performance    Modification or Assistance to Complete Evaluation   Min-Moderate modification of tasks or assist with assess necessary to complete eval    OT Frequency 2x / week    OT Duration 12 weeks    OT Treatment/Interventions Self-care/ADL training;Paraffin;Moist Heat;Fluidtherapy;Contrast Bath;Therapeutic exercise;Manual Therapy;Patient/family education;Passive range of motion;Splinting;DME and/or AE instruction    Consulted and Agree with Plan of Care Patient             Patient will benefit from skilled therapeutic intervention in order to improve the following deficits and impairments:   Body Structure / Function / Physical Skills: ADL, Coordination, Flexibility, Edema, Dexterity, IADL, UE functional use, Strength, Decreased knowledge of use of DME, FMC, Pain       Visit Diagnosis: Stiffness of left hand, not elsewhere classified  Stiffness of left elbow, not elsewhere classified  Localized edema  Muscle weakness (generalized)  Stiffness of left shoulder, not elsewhere classified  Stiffness of left wrist, not elsewhere classified    Problem List Patient Active Problem List   Diagnosis Date Noted   Lymphedema of arm 12/11/2020   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   Sleeps in sitting position due to orthopnea 10/02/2020   Obesity with alveolar hypoventilation and body mass index (BMI) of 40 or greater (HCC) 10/02/2020   Chronic intermittent hypoxia with obstructive sleep apnea 10/02/2020   Pressure ulcer of sacral region, stage 4 (HCC) 09/05/2020   Diabetes mellitus (HCC) 09/05/2020  Dyspnea 08/23/2020   Drug induced constipation    Hypokalemia    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, [place], [address] Phone: [phone]   Fax:  [phone]  Name: SEBRON MCMAHILL MRN: [id number] Date of Birth: Jan 19, 1986

## 2021-02-19 ENCOUNTER — Ambulatory Visit: Payer: BC Managed Care – PPO | Admitting: Physical Medicine and Rehabilitation

## 2021-02-21 ENCOUNTER — Ambulatory Visit: Payer: BC Managed Care – PPO | Admitting: Occupational Therapy

## 2021-02-21 DIAGNOSIS — M25622 Stiffness of left elbow, not elsewhere classified: Secondary | ICD-10-CM

## 2021-02-21 DIAGNOSIS — R600 Localized edema: Secondary | ICD-10-CM

## 2021-02-21 DIAGNOSIS — M25612 Stiffness of left shoulder, not elsewhere classified: Secondary | ICD-10-CM

## 2021-02-21 DIAGNOSIS — M6281 Muscle weakness (generalized): Secondary | ICD-10-CM

## 2021-02-21 DIAGNOSIS — M25642 Stiffness of left hand, not elsewhere classified: Secondary | ICD-10-CM

## 2021-02-21 DIAGNOSIS — M25632 Stiffness of left wrist, not elsewhere classified: Secondary | ICD-10-CM

## 2021-02-21 NOTE — Therapy (Signed)
[facility] [address] [place], [address] Phone: [phone]   Fax:  [phone]  Occupational Therapy Treatment  Patient Details  Name: Collin Brown MRN: [id number] Date of Birth: 27-Apr-1986 Referring Provider (OT): Dr Vladimir Creeks,   Encounter Date: 02/21/2021   OT End of Session - 02/21/21 0828     Visit Number 12    Number of Visits 24    Date for OT Re-Evaluation 03/25/21    OT Start Time 0821    OT Stop Time 0900    OT Time Calculation (min) 39 min    Activity Tolerance Patient tolerated treatment well    Behavior During Therapy [facility] for tasks assessed/performed             Past Medical History:  Diagnosis Date   Acute blood loss anemia    Critical illness myopathy    Diabetes mellitus without complication (HCC)    Dyspnea 08/23/2020   Hearing loss in right ear    Hypertension    Pneumonia due to COVID-19 virus 05/04/2020   Sleep apnea     Past Surgical History:  Procedure Laterality Date   TONSILLECTOMY     WOUND DEBRIDEMENT N/A 06/29/2020   Procedure: DEBRIDEMENT SACRAL WOUND;  Surgeon: Axel Filler, MD;  Location: [facility];  Service: General;  Laterality: N/A;    There were no vitals filed for this visit.   Subjective Assessment - 02/21/21 0827     Subjective  The pain is better- I did some ice and imflammatory-    Pertinent History Collin Brown is a 35 y.o. male who unfortunately contracted COVID in September 2021. He was ventilated for prolonged period of time and went in recovery notice he had bilateral arm weakness. His right upper extremity has since resolved with exception of some residual numbness and tingling in his fourth and fifth digit however he has noticed little improvement to his left upper extremity. He has had increased shoulder and elbow movement over the last couple of months, but still overall little improvement compared to original baseline. He has  been undergoing physical and occupational therapy and his pain has decreased over time. Detailed physical examination is above. However patient does have dependent edema in his left hand as well as a significant left wrist and fingers contracture. He is tender to palpation the proximal aspect of his left arm and does have anhidrosis noted with significant amount of dry skin on his left forearm. 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 C7-T1 is working, but very difficult to assess due to severe contractures and frozen joints. Would like the patient to undergo evaluation from orthopedic hand surgeon. At this point his bicep strength would be very hard to beat with a nerve transfer for elbow flexion, his main limitation in this regard is an elbow extension contracture. However possible consideration for radial to axillary nerve transfer and spinal accessory to suprascapular nerve transfer for increased movement of his shoulder depending on his progress. Potential options were discussed with the patient and his mother. We would like patient to see occupational therapist who specializes in brachial plexus injuries. We would like to see him back in clinic in approximately 2 months for reevaluation and progress. He was encouraged to reach out to [facility] in the meantime for any questions or concerns.    Patient Stated  Goals Want to be able to use my L hand to bath, lift or grip , play games, put my belt on , pick up boxes and do my job                [facility] OT Assessment - 02/21/21 0001       AROM   Left Wrist Extension 15 Degrees      Strength   Right Hand Grip (lbs) 71    Left Hand Grip (lbs) 12    Left Hand Lateral Pinch 10 lbs    Left Hand 3 Point Pinch 6 lbs      Left Hand AROM   L Index  MCP 0-90 45 Degrees    L Index PIP 0-100 85 Degrees    L Long  MCP 0-90 40 Degrees    L Long PIP 0-100 60  Degrees    L Ring  MCP 0-90 40 Degrees    L Ring PIP 0-100 45 Degrees    L Little  MCP 0-90 25 Degrees    L Little PIP 0-100 70 Degrees                      OT Treatments/Exercises (OP) - 02/21/21 0001       Moist Heat Therapy   Number Minutes Moist Heat 5 Minutes    Moist Heat Location Hand   wrist - heat            Pt made great progress since SOC - wrist extenion -25 and now 15 Digits 2nd and 3rd flexion was 30  MC's and 40-45 for PIP's- 5th this worse NOW  35 and 45 MC's and 60-80 for PIP's 4th the worse Thumb could not do lat and 3 point grip  and now Grip 12 lbs ,lat grip 7 lbs  and 3 point grip 5 lbs Done soft tissue mobs and joint mobs to L hand prior to  fitted with flexion glove - able to fit - and tolerate 2 min  And then 2 x 2 min in combination with knuckle bender done this date  Pt to do at home and add to HEP    Elbow flexion PROM done by OT -  do carry object at work  PROM and prolonged flexion stretch to MC's and PIP 'ad thumb PA  and RA Prior to flexion glove done   Blue foam block - 3 point pinch , lat grip -and composite attempt       OT Education - 02/21/21 0828     Education Details progress and HEP review and changes    Person(s) Educated Patient    Methods Explanation;Demonstration;Tactile cues;Verbal cues;Handout    Comprehension Verbal cues required;Returned demonstration;Verbalized understanding              OT Short Term Goals - 12/31/20 2024       OT SHORT TERM GOAL #1   Title Pt and mom to be independent in HEP to decrease edema and increase AROM in forearm to digits    Baseline no lknowledge- edema increase digits to wrist , AROM decrease in wrist , sup , digits flexion and thumb - see lfowsheet    Time 4    Period Weeks    Status New    Target Date 01/28/21               OT Long Term Goals - 12/31/20 2026       OT LONG  TERM GOAL #1   Title L wrist AROM increase by 15-20 degrees for sup, extention,  flexion , UD to carry or hold objects on palm and push up from chair    Baseline wrist -25 ext, flexion 52, UD 5, sup 45    Time 6    Period Weeks    Status New    Target Date 02/11/21      OT LONG TERM GOAL #2   Title L digits flexion increase by more than 20 degrees in MC and PIP to hold 2 inch cylinder objects in ADL's and at work    Baseline MC flexion 25-30 degrees, PIP's 30-45 degrees - no grip    Time 6    Period Weeks    Status New    Target Date 02/11/21      OT LONG TERM GOAL #3   Title L digits AROM increase for pt to touch palm to use in 25% of ADL's    Baseline see goal 2 for base line - no grip -and no using hand in any ADL's    Time 12    Period Weeks    Status New    Target Date 03/25/21      OT LONG TERM GOAL #4   Title update HEP for strengthening and proximal ROM as progress    Time 6    Period Weeks    Status New    Target Date 02/11/21                   Plan - 02/21/21 0829     Clinical Impression Statement Pt had increase pain in L UE earlier this week after walking into closet - this date pain decrease - focus this date on hand and wrist L - soft tissue mobs and jont mobs- fitted with flexion glove to use in combination with knuckle bender - pt show increase AROM afterwards compare to about 4 wks ago - wrist ext improving- pt to cont with AAROM for shoulder and elbow to prevent stiffness - cont with severe stiffness in MC's more than PIP's -and elbow flexion tight - pt has appt with Dr Franklyn Lor for middle Aug    OT Occupational Profile and History Problem Focused Assessment - Including review of records relating to presenting problem    Occupational performance deficits (Please refer to evaluation for details): ADL's;IADL's;Work;Play;Leisure;Social Participation    Body Structure / Function / Physical Skills ADL;Coordination;Flexibility;Edema;Dexterity;IADL;UE functional use;Strength;Decreased knowledge of use of DME;FMC;Pain    Rehab Potential Fair     Clinical Decision Making Several treatment options, min-mod task modification necessary    Comorbidities Affecting Occupational Performance: May have comorbidities impacting occupational performance    Modification or Assistance to Complete Evaluation  Min-Moderate modification of tasks or assist with assess necessary to complete eval    OT Frequency 2x / week    OT Duration 12 weeks    OT Treatment/Interventions Self-care/ADL training;Paraffin;Moist Heat;Fluidtherapy;Contrast Bath;Therapeutic exercise;Manual Therapy;Patient/family education;Passive range of motion;Splinting;DME and/or AE instruction    Consulted and Agree with Plan of Care Patient             Patient will benefit from skilled therapeutic intervention in order to improve the following deficits and impairments:   Body Structure / Function / Physical Skills: ADL, Coordination, Flexibility, Edema, Dexterity, IADL, UE functional use, Strength, Decreased knowledge of use of DME, FMC, Pain       Visit Diagnosis: Stiffness of left hand, not elsewhere classified  Stiffness  of left elbow, not elsewhere classified  Localized edema  Muscle weakness (generalized)  Stiffness of left shoulder, not elsewhere classified  Stiffness of left wrist, not elsewhere classified    Problem List Patient Active Problem List   Diagnosis Date Noted   Lymphedema of arm 12/11/2020   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   Sleeps in sitting position due to orthopnea 10/02/2020   Obesity with alveolar hypoventilation and body mass index (BMI) of 40 or greater (HCC) 10/02/2020   Chronic intermittent hypoxia with obstructive sleep apnea 10/02/2020   Pressure ulcer of sacral region, stage 4 (HCC) 09/05/2020   Diabetes mellitus (HCC) 09/05/2020   Dyspnea 08/23/2020   Drug induced constipation    Hypokalemia    COVID-19 long hauler manifesting chronic decreased mobility and endurance 06/17/2020   Debility 06/08/2020   Critical illness  myopathy    Supplemental oxygen dependent    Essential hypertension    Brachial plexopathy    Neuropathic pain    Sleep apnea     Oletta CohnuPreez, Tinie Mcgloin OTR/L,CLT 02/21/2021, 12:26 PM  [facility] [address] [place], [place], [address] Phone: [phone]   Fax:  [phone]  Name: Julaine HuaMatthew M Murrillo MRN: [id number] Date of Birth: [birth year]

## 2021-02-26 ENCOUNTER — Ambulatory Visit: Payer: BC Managed Care – PPO | Admitting: Occupational Therapy

## 2021-02-26 DIAGNOSIS — M25642 Stiffness of left hand, not elsewhere classified: Secondary | ICD-10-CM | POA: Diagnosis not present

## 2021-02-26 DIAGNOSIS — M25622 Stiffness of left elbow, not elsewhere classified: Secondary | ICD-10-CM

## 2021-02-26 DIAGNOSIS — R600 Localized edema: Secondary | ICD-10-CM

## 2021-02-26 DIAGNOSIS — M6281 Muscle weakness (generalized): Secondary | ICD-10-CM

## 2021-02-26 NOTE — Therapy (Signed)
[facility] [address], [place], [address] Phone: [phone]   Fax:  [phone]  Occupational Therapy Treatment  Patient Details  Name: [facility] MRN: [id number] Date of Birth: 09/21/1985 Referring Provider (OT): Dr Vladimir Creeks,   Encounter Date: 02/26/2021   OT End of Session - 02/26/21 0900     Visit Number 13    Number of Visits 24    Date for OT Re-Evaluation 03/25/21    OT Start Time 0900    OT Stop Time 0941    OT Time Calculation (min) 41 min    Activity Tolerance Patient tolerated treatment well    Behavior During Therapy [facility] for tasks assessed/performed             Past Medical History:  Diagnosis Date   Acute blood loss anemia    Critical illness myopathy    Diabetes mellitus without complication (HCC)    Dyspnea 08/23/2020   Hearing loss in right ear    Hypertension    Pneumonia due to COVID-19 virus 05/04/2020   Sleep apnea     Past Surgical History:  Procedure Laterality Date   TONSILLECTOMY     WOUND DEBRIDEMENT N/A 06/29/2020   Procedure: DEBRIDEMENT SACRAL WOUND;  Surgeon: Axel Filler, MD;  Location: [facility] OR;  Service: General;  Laterality: N/A;    There were no vitals filed for this visit.   Subjective Assessment - 02/26/21 0859     Subjective  I am up front at work now and get bored - but they are going to try and get me something to prop up my shoulder or arm - that helps for the pain    Pertinent History Col[facility] is a 35 y.o. male who unfortunately contracted COVID in September 2021. He was ventilated for prolonged period of time and went in recovery notice he had bilateral arm weakness. His right upper extremity has since resolved with exception of some residual numbness and tingling in his fourth and fifth digit however he has noticed little improvement to his left upper extremity. He has had increased shoulder and elbow movement over the last  couple of months, but still overall little improvement compared to original baseline. He has been undergoing physical and occupational therapy and his pain has decreased over time. Detailed physical examination is above. However patient does have dependent edema in his left hand as well as a significant left wrist and fingers contracture. He is tender to palpation the proximal aspect of his left arm and does have anhidrosis noted with significant amount of dry skin on his left forearm. 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 C7-T1 is working, but very difficult to assess due to severe contractures and frozen joints. Would like the patient to undergo evaluation from orthopedic hand surgeon. At this point his bicep strength would be very hard to beat with a nerve transfer for elbow flexion, his main limitation in this regard is an elbow extension contracture. However possible consideration for radial to axillary nerve transfer and spinal accessory to suprascapular nerve transfer for increased movement of his shoulder depending on his progress. Potential options were discussed with the patient and his mother. We would like patient to see occupational therapist who specializes in brachial plexus injuries. We would like to see him back in clinic in approximately 2 months for  reevaluation and progress. He was encouraged to reach out to [place] in the meantime for any questions or concerns.    Patient Stated Goals Want to be able to use my L hand to bath, lift or grip , play games, put my belt on , pick up boxes and do my job    Currently in Pain? Yes    Pain Score 2     Pain Location Arm    Pain Orientation Left    Pain Descriptors / Indicators Aching    Pain Type Acute pain    Pain Onset More than a month ago    Pain Frequency Constant                          OT Treatments/Exercises (OP) -  02/26/21 0001       Moist Heat Therapy   Number Minutes Moist Heat 6 Minutes    Moist Heat Location Hand             Pt made great progress since SOC - wrist extenion -25 and now 15 Digits 2nd and 3rd flexion was 30  MC's and 40-45 for PIP's- 5th  Last week   35 and 45 MC's and 60-80 for 4th PIP the worse- fitted pt with oval 8 splint to use during day some at home to decrease riks for swanneck defomity  Thumb could not do lat and 3 point grip  and now Grip 12 lbs ,lat grip 7 lbs  and 3 point grip 5 lbs Done soft tissue mobs and joint mobs to L hand prior to  donning flexion glove - able to fit - and tolerate 2 min And then 2 x 2 min in combination with knuckle bender done this date  Show increase MC and PIP flexion in some of the digits - pt to cont to use at home  Pt to cont to do at home     Elbow flexion PROM done by OT -  do carry object at work   PROM and prolonged flexion stretch to MC's and PIP 'ad thumb PA  and RA Prior to flexion glove done    Blue foam block - 3 point pinch , lat grip -and composite attempt           OT Education - 02/26/21 0907     Education Details progress and HEP review and changes    Person(s) Educated Patient    Methods Explanation;Demonstration;Tactile cues;Verbal cues;Handout    Comprehension Verbal cues required;Returned demonstration;Verbalized understanding              OT Short Term Goals - 12/31/20 2024       OT SHORT TERM GOAL #1   Title Pt and mom to be independent in HEP to decrease edema and increase AROM in forearm to digits    Baseline no lknowledge- edema increase digits to wrist , AROM decrease in wrist , sup , digits flexion and thumb - see lfowsheet    Time 4    Period Weeks    Status New    Target Date 01/28/21               OT Long Term Goals - 12/31/20 2026       OT LONG TERM GOAL #1   Title L wrist AROM increase by 15-20 degrees for sup, extention, flexion , UD to carry or hold objects on  palm and push up from chair  Baseline wrist -25 ext, flexion 52, UD 5, sup 45    Time 6    Period Weeks    Status New    Target Date 02/11/21      OT LONG TERM GOAL #2   Title L digits flexion increase by more than 20 degrees in MC and PIP to hold 2 inch cylinder objects in ADL's and at work    Baseline MC flexion 25-30 degrees, PIP's 30-45 degrees - no grip    Time 6    Period Weeks    Status New    Target Date 02/11/21      OT LONG TERM GOAL #3   Title L digits AROM increase for pt to touch palm to use in 25% of ADL's    Baseline see goal 2 for base line - no grip -and no using hand in any ADL's    Time 12    Period Weeks    Status New    Target Date 03/25/21      OT LONG TERM GOAL #4   Title update HEP for strengthening and proximal ROM as progress    Time 6    Period Weeks    Status New    Target Date 02/11/21                   Plan - 02/26/21 0900     Clinical Impression Statement Pt doing better this week after he had increase pain in L UE last week after walking into closet  - focus this date again on L  hand and wrist - soft tissue mobs and jont mobs- fitted with flexion glove last time and review and done in clinic alone and in combination with knuckle bender - pt [facility]  [facility] - wrist ext improving- pt to cont with AAROM for shoulder and elbow to prevent stiffness -  Pt cont with severe stiffness in MC's and  PIP's -and elbow flexion tight -  fitted with oval 8 to wear some on 4th PIP to decrease risk for swanneck - pt has appt with Dr Franklyn Loruch for middle Aug    OT Occupational Profile and History Problem Focused Assessment - Including review of records relating to presenting problem    Occupational performance deficits (Please refer to evaluation for details): ADL's;IADL's;Work;Play;Leisure;Social Participation    Body Structure / Function / Physical Skills ADL;Coordination;Flexibility;Edema;Dexterity;IADL;UE functional  use;Strength;Decreased knowledge of use of DME;FMC;Pain    Rehab Potential Fair    Clinical Decision Making Several treatment options, min-mod task modification necessary    Comorbidities Affecting Occupational Performance: May have comorbidities impacting occupational performance    Modification or Assistance to Complete Evaluation  Min-Moderate modification of tasks or assist with assess necessary to complete eval    OT Frequency 2x / week    OT Duration 12 weeks    OT Treatment/Interventions Self-care/ADL training;Paraffin;Moist Heat;Fluidtherapy;Contrast Bath;Therapeutic exercise;Manual Therapy;Patient/family education;Passive range of motion;Splinting;DME and/or AE instruction    Consulted and Agree with Plan of Care Patient             Patient will benefit from skilled therapeutic intervention in order to improve the following deficits and impairments:   Body Structure / Function / Physical Skills: ADL, Coordination, Flexibility, Edema, Dexterity, IADL, UE functional use, Strength, Decreased knowledge of use of DME, FMC, Pain       Visit Diagnosis: Stiffness of left hand, not elsewhere classified  Stiffness of left elbow, not elsewhere  classified  Localized edema  Muscle weakness (generalized)    Problem List Patient Active Problem List   Diagnosis Date Noted   Lymphedema of arm 12/11/2020   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   Sleeps in sitting position due to orthopnea 10/02/2020   Obesity with alveolar hypoventilation and body mass index (BMI) of 40 or greater (HCC) 10/02/2020   Chronic intermittent hypoxia with obstructive sleep apnea 10/02/2020   Pressure ulcer of sacral region, stage 4 (HCC) 09/05/2020   Diabetes mellitus (HCC) 09/05/2020   Dyspnea 08/23/2020   Drug induced constipation    Hypokalemia    COVID-19 long hauler manifesting chronic decreased mobility and endurance 06/17/2020   Debility 06/08/2020   Critical illness myopathy    Supplemental  oxygen dependent    Essential hypertension    Brachial plexopathy    Neuropathic pain    Sleep apnea     Oletta Cohn OTR/L,CLT 02/26/2021, 9:44 AM  [facility] [address], [place], [address] Phone: [phone]   Fax:  [phone]  Name: [facility] MRN: [id number] Date of Birth: [birth year]

## 2021-02-28 ENCOUNTER — Encounter: Payer: Self-pay | Admitting: Occupational Therapy

## 2021-02-28 ENCOUNTER — Ambulatory Visit: Payer: BC Managed Care – PPO | Admitting: Occupational Therapy

## 2021-02-28 DIAGNOSIS — M25622 Stiffness of left elbow, not elsewhere classified: Secondary | ICD-10-CM

## 2021-02-28 DIAGNOSIS — M25642 Stiffness of left hand, not elsewhere classified: Secondary | ICD-10-CM | POA: Diagnosis not present

## 2021-02-28 DIAGNOSIS — R600 Localized edema: Secondary | ICD-10-CM

## 2021-02-28 DIAGNOSIS — M25612 Stiffness of left shoulder, not elsewhere classified: Secondary | ICD-10-CM

## 2021-02-28 DIAGNOSIS — M6281 Muscle weakness (generalized): Secondary | ICD-10-CM

## 2021-02-28 DIAGNOSIS — M25632 Stiffness of left wrist, not elsewhere classified: Secondary | ICD-10-CM

## 2021-02-28 NOTE — Therapy (Signed)
[facility] [address] [place], [address] Phone: [phone]   Fax:  [phone]  Occupational Therapy Treatment  Patient Details  Name: Collin Brown MRN: [id number] Date of Birth: 01/24/1986 Referring Provider (OT): Dr Vladimir Creeks,   Encounter Date: 02/28/2021   OT End of Session - 02/28/21 1312     Visit Number 14    Number of Visits 24    Date for OT Re-Evaluation 03/25/21    OT Start Time 1302    OT Stop Time 1346    OT Time Calculation (min) 44 min    Activity Tolerance Patient tolerated treatment well    Behavior During Therapy [facility] for tasks assessed/performed             Past Medical History:  Diagnosis Date   Acute blood loss anemia    Critical illness myopathy    Diabetes mellitus without complication (HCC)    Dyspnea 08/23/2020   Hearing loss in right ear    Hypertension    Pneumonia due to COVID-19 virus 05/04/2020   Sleep apnea     Past Surgical History:  Procedure Laterality Date   TONSILLECTOMY     WOUND DEBRIDEMENT N/A 06/29/2020   Procedure: DEBRIDEMENT SACRAL WOUND;  Surgeon: Axel Filler, MD;  Location: [facility] OR;  Service: General;  Laterality: N/A;    There were no vitals filed for this visit.   Subjective Assessment - 02/28/21 1309     Subjective  [place], was there for 6 hours.  After 1-1.5 hours he has to rest and sit.  Took the week off for work and may go to [employer] with a friend tomorrow.    Pertinent History Collin Brown is a 35 y.o. male who unfortunately contracted COVID in September 2021. He was ventilated for prolonged period of time and went in recovery notice he had bilateral arm weakness. His right upper extremity has since resolved with exception of some residual numbness and tingling in his fourth and fifth digit however he has noticed little improvement to his left upper extremity. He has had  increased shoulder and elbow movement over the last couple of months, but still overall little improvement compared to original baseline. He has been undergoing physical and occupational therapy and his pain has decreased over time. Detailed physical examination is above. However patient does have dependent edema in his left hand as well as a significant left wrist and fingers contracture. He is tender to palpation the proximal aspect of his left arm and does have anhidrosis noted with significant amount of dry skin on his left forearm. 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 C7-T1 is working, but very difficult to assess due to severe contractures and frozen joints. Would like the patient to undergo evaluation from orthopedic hand surgeon. At this point his bicep strength would be very hard to beat with a nerve transfer for elbow flexion, his main limitation in this regard is an elbow extension contracture. However possible consideration for radial to axillary nerve transfer and spinal accessory to suprascapular nerve transfer for increased movement of his shoulder depending on his progress. Potential options were discussed with the patient and his mother. We would like patient to see occupational therapist who specializes in brachial plexus injuries. We would like to see him  back in clinic in approximately 2 months for reevaluation and progress. He was encouraged to reach out to [place] in the meantime for any questions or concerns.    Patient Stated Goals Want to be able to use my L hand to bath, lift or grip , play games, put my belt on , pick up boxes and do my job    Currently in Pain? Yes    Pain Score 0-No pain    Pain Location Arm    Pain Orientation Left             Moist heat to left hand and wrist for 5 mins prior to therapeutic intervention to decrease pain and increase ROM.   Flexion glove  to left hand for 2 mins, then switched to knucklebender for 2 mins and then combined both for 2 mins.   Focused on PROM left UE with prolonged stretching.  PROM and prolonged flexion stretch to MC's and PIP 'ad thumb PA and RA Elbow 104 degrees flexion  PIP flexion index 65 degrees MF 65 RF30  Pt wearing oval 8 splint at times during the day to decrease risk of swan neck deformity.   Blue foam block - 3 point pinch , lat grip -and composite attempt Review of HEP, pt demonstrates understanding.  Response to tx: Pt continues to demonstrate some gains in ROM and decreased pain in left UE.  Elbow flexion to 104 today.  Tolerating flexion glove and knuckle bender well in isolation and in conjunction.  Patient continues to respond well to therapeutic intervention, pain has decreased and motion with some improvements. Pt continues to benefit from skilled OT services to decrease pain and improve functional use for necessary daily tasks at home, work and in the community.                     OT Education - 02/28/21 1312     Education Details HEP review    Person(s) Educated Patient    Methods Explanation;Demonstration;Tactile cues;Verbal cues;Handout    Comprehension Verbal cues required;Returned demonstration;Verbalized understanding              OT Short Term Goals - 12/31/20 2024       OT SHORT TERM GOAL #1   Title Pt and mom to be independent in HEP to decrease edema and increase AROM in forearm to digits    Baseline no lknowledge- edema increase digits to wrist , AROM decrease in wrist , sup , digits flexion and thumb - see lfowsheet    Time 4    Period Weeks    Status New    Target Date 01/28/21               OT Long Term Goals - 12/31/20 2026       OT LONG TERM GOAL #1   Title L wrist AROM increase by 15-20 degrees for sup, extention, flexion , UD to carry or hold objects on palm and push up from chair    Baseline wrist -25 ext, flexion 52, UD 5, sup  45    Time 6    Period Weeks    Status New    Target Date 02/11/21      OT LONG TERM GOAL #2   Title L digits flexion increase by more than 20 degrees in MC and PIP to hold 2 inch cylinder objects in ADL's and at work    Baseline MC flexion 25-30 degrees, PIP's 30-45 degrees -  no grip    Time 6    Period Weeks    Status New    Target Date 02/11/21      OT LONG TERM GOAL #3   Title L digits AROM increase for pt to touch palm to use in 25% of ADL's    Baseline see goal 2 for base line - no grip -and no using hand in any ADL's    Time 12    Period Weeks    Status New    Target Date 03/25/21      OT LONG TERM GOAL #4   Title update HEP for strengthening and proximal ROM as progress    Time 6    Period Weeks    Status New    Target Date 02/11/21                   Plan - 02/28/21 1313     Clinical Impression Statement Pt continues to demonstrate some gains in ROM and decreased pain in left UE.  Elbow flexion to 104 today.  Tolerating flexion glove and knuckle bender well in isolation and in conjunction.  Patient continues to respond well to therapeutic intervention, pain has decreased and motion with some improvements. Pt continues to benefit from skilled OT services to decrease pain and improve functional use for necessary daily tasks at home, work and in the community.   ?    OT Occupational Profile and History Problem Focused Assessment - Including review of records relating to presenting problem    Occupational performance deficits (Please refer to evaluation for details): ADL's;IADL's;Work;Play;Leisure;Social Participation    Body Structure / Function / Physical Skills ADL;Coordination;Flexibility;Edema;Dexterity;IADL;UE functional use;Strength;Decreased knowledge of use of DME;FMC;Pain    Rehab Potential Fair    Clinical Decision Making Several treatment options, min-mod task modification necessary    Comorbidities Affecting Occupational Performance: May have  comorbidities impacting occupational performance    Modification or Assistance to Complete Evaluation  Min-Moderate modification of tasks or assist with assess necessary to complete eval    OT Frequency 2x / week    OT Duration 12 weeks    OT Treatment/Interventions Self-care/ADL training;Paraffin;Moist Heat;Fluidtherapy;Contrast Bath;Therapeutic exercise;Manual Therapy;Patient/family education;Passive range of motion;Splinting;DME and/or AE instruction    Consulted and Agree with Plan of Care Patient             Patient will benefit from skilled therapeutic intervention in order to improve the following deficits and impairments:   Body Structure / Function / Physical Skills: ADL, Coordination, Flexibility, Edema, Dexterity, IADL, UE functional use, Strength, Decreased knowledge of use of DME, FMC, Pain       Visit Diagnosis: Stiffness of left hand, not elsewhere classified  Stiffness of left elbow, not elsewhere classified  Localized edema  Muscle weakness (generalized)  Stiffness of left shoulder, not elsewhere classified  Stiffness of left wrist, not elsewhere classified    Problem List Patient Active Problem List   Diagnosis Date Noted   Lymphedema of arm 12/11/2020   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   Sleeps in sitting position due to orthopnea 10/02/2020   Obesity with alveolar hypoventilation and body mass index (BMI) of 40 or greater (HCC) 10/02/2020   Chronic intermittent hypoxia with obstructive sleep apnea 10/02/2020   Pressure ulcer of sacral region, stage 4 (HCC) 09/05/2020   Diabetes mellitus (HCC) 09/05/2020   Dyspnea 08/23/2020   Drug induced constipation    Hypokalemia    COVID-19 long hauler manifesting chronic decreased mobility and endurance  06/17/2020   Debility 06/08/2020   Critical illness myopathy    Supplemental oxygen dependent    Essential hypertension    Brachial plexopathy    Neuropathic pain    Sleep apnea    Shanterica Biehler T Breslyn Abdo,  OTR/L, CLT  Leann Mayweather 03/01/2021, 4:31 PM  [facility] [address] [place], [address] Phone: [phone]   Fax:  [phone]  Name: Collin Brown MRN: [id number] Date of Birth: 08-13-1985

## 2021-03-04 ENCOUNTER — Ambulatory Visit: Payer: BC Managed Care – PPO | Admitting: Occupational Therapy

## 2021-03-04 DIAGNOSIS — M6281 Muscle weakness (generalized): Secondary | ICD-10-CM

## 2021-03-04 DIAGNOSIS — M25642 Stiffness of left hand, not elsewhere classified: Secondary | ICD-10-CM

## 2021-03-04 DIAGNOSIS — M25612 Stiffness of left shoulder, not elsewhere classified: Secondary | ICD-10-CM

## 2021-03-04 DIAGNOSIS — R600 Localized edema: Secondary | ICD-10-CM

## 2021-03-04 DIAGNOSIS — M25632 Stiffness of left wrist, not elsewhere classified: Secondary | ICD-10-CM

## 2021-03-04 DIAGNOSIS — M25622 Stiffness of left elbow, not elsewhere classified: Secondary | ICD-10-CM

## 2021-03-04 NOTE — Therapy (Signed)
[facility] [address], [place], [address] Phone: [phone]   Fax:  [phone]  Occupational Therapy Treatment  Patient Details  Name: Collin Brown MRN: [id number] Date of Birth: 02-13-1986 Referring Provider (OT): Dr Vladimir Creeks,   Encounter Date: 03/04/2021   OT End of Session - 03/04/21 1135     Visit Number 15    Number of Visits 24    Date for OT Re-Evaluation 03/25/21    OT Start Time 0905    OT Stop Time 0945    OT Time Calculation (min) 40 min    Activity Tolerance Patient tolerated treatment well    Behavior During Therapy [facility] for tasks assessed/performed             Past Medical History:  Diagnosis Date   Acute blood loss anemia    Critical illness myopathy    Diabetes mellitus without complication (HCC)    Dyspnea 08/23/2020   Hearing loss in right ear    Hypertension    Pneumonia due to COVID-19 virus 05/04/2020   Sleep apnea     Past Surgical History:  Procedure Laterality Date   TONSILLECTOMY     WOUND DEBRIDEMENT N/A 06/29/2020   Procedure: DEBRIDEMENT SACRAL WOUND;  Surgeon: Axel Filler, MD;  Location: [facility] OR;  Service: General;  Laterality: N/A;    There were no vitals filed for this visit.   Subjective Assessment - 03/04/21 1133     Subjective  I were on vacation this past week - going back to work Wed    Pertinent History Collin Brown is a 35 y.o. male who unfortunately contracted COVID in September 2021. He was ventilated for prolonged period of time and went in recovery notice he had bilateral arm weakness. His right upper extremity has since resolved with exception of some residual numbness and tingling in his fourth and fifth digit however he has noticed little improvement to his left upper extremity. He has had increased shoulder and elbow movement over the last couple of months, but still overall little improvement compared to original baseline. He  has been undergoing physical and occupational therapy and his pain has decreased over time. Detailed physical examination is above. However patient does have dependent edema in his left hand as well as a significant left wrist and fingers contracture. He is tender to palpation the proximal aspect of his left arm and does have anhidrosis noted with significant amount of dry skin on his left forearm. 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    [name] 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 C7-T1 is working, but very difficult to assess due to severe contractures and frozen joints. Would like the patient to undergo evaluation from orthopedic hand surgeon. At this point his bicep strength would be very hard to beat with a nerve transfer for elbow flexion, his main limitation in this regard is an elbow extension contracture. However possible consideration for radial to axillary nerve transfer and spinal accessory to suprascapular nerve transfer for increased movement of his shoulder depending on his progress. Potential options were discussed with the patient and his mother. We would like patient to see occupational therapist who specializes in brachial plexus injuries. We would like to see him back in clinic in approximately 2 months for reevaluation and progress. He was encouraged to reach out to [place] in the meantime for any questions or concerns.  Patient Stated Goals Want to be able to use my L hand to bath, lift or grip , play games, put my belt on , pick up boxes and do my job    Currently in Pain? No/denies                [facility] - 03[facility]04[facility]21 0001       AROM   Left Elbow Flexion 108    Left Wrist Extension 30 Degrees    Left Wrist Flexion 75 Degrees    Left Wrist Radial Deviation 30 Degrees    Left Wrist Ulnar Deviation 5 Degrees      Left Hand AROM   L Thumb MCP 0-60 50 Degrees    L Thumb IP 0-80 70 Degrees    L Thumb Radial  ADduction/ABduction 0-55 42    L Thumb Palmar ADduction/ABduction 0-45 50    L Index  MCP 0-90 45 Degrees    L Index PIP 0-100 85 Degrees    L Long  MCP 0-90 50 Degrees    L Long PIP 0-100 65 Degrees    L Ring  MCP 0-90 42 Degrees    L Ring PIP 0-100 45 Degrees    L Little  MCP 0-90 20 Degrees    L Little PIP 0-100 74 Degrees                      OT Treatments/Exercises (OP) - 03/04/21 0001       Moist Heat Therapy   Number Minutes Moist Heat 6 Minutes    Moist Heat Location --   hand and wrist PROM             Cont to make progress in L UE - elbow to hand - slow but steady   [facility] -25 and now 30  After table slides and PROM for wrist   UD and RD of wrist - done and add to HEP PROM table and followed by AROM over edge of table  PROM and prolonged flexion stretch for MC and PIP of L hand  - fitted last time pt with oval 8 splint to use during day some at home to decrease riks for swanneck defomity   Done soft tissue mobs and joint mobs to L hand and wrist this date - done some carpal rolls  Prior to ROM   Show increase MC and PIP flexion in some of the digits - pt to cont to use at home     Elbow flexion PROM done by OT -  do carry object at work   PROM and prolonged flexion stretch to MC's and PIP 'ad thumb PA  and RA            OT Education - 03/04/21 1135     Education Details HEP review    Person(s) Educated Patient    Methods Explanation;Demonstration;Tactile cues;Verbal cues;Handout    Comprehension Verbal cues required;Returned demonstration;Verbalized understanding              OT Short Term Goals - 12/31/20 2024       OT SHORT TERM GOAL #1   Title Pt and mom to be independent in HEP to decrease edema and increase AROM in forearm to digits    Baseline no lknowledge- edema increase digits to wrist , AROM decrease in wrist , sup , digits flexion and thumb - see lfowsheet    Time 4  Period  Weeks    Status New    Target Date 01/28/21               OT Long Term Goals - 12/31/20 2026       OT LONG TERM GOAL #1   Title L wrist AROM increase by 15-20 degrees for sup, extention, flexion , UD to carry or hold objects on palm and push up from chair    Baseline wrist -25 ext, flexion 52, UD 5, sup 45    Time 6    Period Weeks    Status New    Target Date 02/11/21      OT LONG TERM GOAL #2   Title L digits flexion increase by more than 20 degrees in MC and PIP to hold 2 inch cylinder objects in ADL's and at work    Baseline MC flexion 25-30 degrees, PIP's 30-45 degrees - no grip    Time 6    Period Weeks    Status New    Target Date 02/11/21      OT LONG TERM GOAL #3   Title L digits AROM increase for pt to touch palm to use in 25% of ADL's    Baseline see goal 2 for base line - no grip -and no using hand in any ADL's    Time 12    Period Weeks    Status New    Target Date 03/25/21      OT LONG TERM GOAL #4   Title update HEP for strengthening and proximal ROM as progress    Time 6    Period Weeks    Status New    Target Date 02/11/21                   Plan - 03/04/21 1136     Clinical Impression Statement Pt cont to make progress in digits flexion and wrist extention - elbow increase in flexion - add PROM for UD and RD this date and pt to cont with knucklebender and flexion glove - tight in MC capsule and 4th and 5th worse - positive Tinel at ulnar N - MC of 5th - pt to cont to increase AROM and PROM in L UE - focuson elbow to hand - pt has appt with DR Franklyn Lor middle Aug    OT Occupational Profile and History Problem Focused Assessment - Including review of records relating to presenting problem    Occupational performance deficits (Please refer to evaluation for details): ADL's;IADL's;Work;Play;Leisure;Social Participation    Body Structure / Function / Physical Skills ADL;Coordination;Flexibility;Edema;Dexterity;IADL;UE functional  use;Strength;Decreased knowledge of use of DME;FMC;Pain    Rehab Potential Fair    Clinical Decision Making Several treatment options, min-mod task modification necessary    Comorbidities Affecting Occupational Performance: May have comorbidities impacting occupational performance    Modification or Assistance to Complete Evaluation  Min-Moderate modification of tasks or assist with assess necessary to complete eval    OT Frequency 2x / week    OT Duration 12 weeks    OT Treatment/Interventions Self-care/ADL training;Paraffin;Moist Heat;Fluidtherapy;Contrast Bath;Therapeutic exercise;Manual Therapy;Patient/family education;Passive range of motion;Splinting;DME and/or AE instruction    Consulted and Agree with Plan of Care Patient             Patient will benefit from skilled therapeutic intervention in order to improve the following deficits and impairments:   Body Structure / Function / Physical Skills: ADL, Coordination, Flexibility, Edema, Dexterity, IADL, UE functional use, Strength, Decreased  knowledge of use of DME, FMC, Pain       Visit Diagnosis: Stiffness of left hand, not elsewhere classified  Stiffness of left elbow, not elsewhere classified  Localized edema  Muscle weakness (generalized)  Stiffness of left shoulder, not elsewhere classified  Stiffness of left wrist, not elsewhere classified    Problem List Patient Active Problem List   Diagnosis Date Noted   Lymphedema of arm 12/11/2020   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   Sleeps in sitting position due to orthopnea 10/02/2020   Obesity with alveolar hypoventilation and body mass index (BMI) of 40 or greater (HCC) 10/02/2020   Chronic intermittent hypoxia with obstructive sleep apnea 10/02/2020   Pressure ulcer of sacral region, stage 4 (HCC) 09/05/2020   Diabetes mellitus (HCC) 09/05/2020   Dyspnea 08/23/2020   Drug induced constipation    Hypokalemia    COVID-19 long hauler manifesting chronic  decreased mobility and endurance 06/17/2020   Debility 06/08/2020   Critical illness myopathy    Supplemental oxygen dependent    Essential hypertension    Brachial plexopathy    Neuropathic pain    Sleep apnea     Oletta Cohn OTR/L,CLT 03/04/2021, 12:05 PM  [facility] [address] [place], [address] Phone: [phone]   Fax:  [phone]  Name: LENZY KERSCHNER MRN: [id number] Date of Birth: 1986/05/29

## 2021-03-07 ENCOUNTER — Ambulatory Visit: Payer: BC Managed Care – PPO | Admitting: Occupational Therapy

## 2021-03-07 DIAGNOSIS — M6281 Muscle weakness (generalized): Secondary | ICD-10-CM

## 2021-03-07 DIAGNOSIS — M25642 Stiffness of left hand, not elsewhere classified: Secondary | ICD-10-CM | POA: Diagnosis not present

## 2021-03-07 DIAGNOSIS — M25632 Stiffness of left wrist, not elsewhere classified: Secondary | ICD-10-CM

## 2021-03-07 DIAGNOSIS — R600 Localized edema: Secondary | ICD-10-CM

## 2021-03-07 DIAGNOSIS — M25612 Stiffness of left shoulder, not elsewhere classified: Secondary | ICD-10-CM

## 2021-03-07 DIAGNOSIS — M25622 Stiffness of left elbow, not elsewhere classified: Secondary | ICD-10-CM

## 2021-03-08 ENCOUNTER — Encounter: Payer: Self-pay | Admitting: Registered Nurse

## 2021-03-08 ENCOUNTER — Encounter: Payer: BC Managed Care – PPO | Attending: Physical Medicine and Rehabilitation | Admitting: Registered Nurse

## 2021-03-08 ENCOUNTER — Other Ambulatory Visit: Payer: Self-pay

## 2021-03-08 VITALS — BP 127/82 | HR 83 | Temp 98.5°F | Ht 70.0 in | Wt 379.0 lb

## 2021-03-08 DIAGNOSIS — G894 Chronic pain syndrome: Secondary | ICD-10-CM | POA: Insufficient documentation

## 2021-03-08 DIAGNOSIS — Z5181 Encounter for therapeutic drug level monitoring: Secondary | ICD-10-CM | POA: Diagnosis not present

## 2021-03-08 DIAGNOSIS — G7281 Critical illness myopathy: Secondary | ICD-10-CM | POA: Insufficient documentation

## 2021-03-08 DIAGNOSIS — Z79891 Long term (current) use of opiate analgesic: Secondary | ICD-10-CM | POA: Insufficient documentation

## 2021-03-08 DIAGNOSIS — L89154 Pressure ulcer of sacral region, stage 4: Secondary | ICD-10-CM | POA: Diagnosis present

## 2021-03-08 MED ORDER — GABAPENTIN 800 MG PO TABS: 800.0000 mg | ORAL_TABLET | Freq: Four times a day (QID) | ORAL | 3 refills | Status: AC | PRN

## 2021-03-08 MED ORDER — OXYCODONE-ACETAMINOPHEN 5-325 MG PO TABS: 1.0000 | ORAL_TABLET | Freq: Four times a day (QID) | ORAL | 0 refills | Status: DC | PRN

## 2021-03-08 NOTE — Therapy (Signed)
[facility] [address] [place], [address] Phone: [phone]   Fax:  [phone]  Occupational Therapy Treatment  Patient Details  Name: Collin Brown MRN: [id number] Date of Birth: 1986-01-21 Referring Provider (OT): Dr Vladimir Creeks,   Encounter Date: 03/07/2021   OT End of Session - 03/07/21 0914     Visit Number 16    Number of Visits 24    Date for OT Re-Evaluation 03/25/21    OT Start Time 0905    OT Stop Time 0943    OT Time Calculation (min) 38 min    Activity Tolerance Patient tolerated treatment well    Behavior During Therapy [facility] for tasks assessed/performed             Past Medical History:  Diagnosis Date   Acute blood loss anemia    Critical illness myopathy    Diabetes mellitus without complication (HCC)    Dyspnea 08/23/2020   Hearing loss in right ear    Hypertension    Pneumonia due to COVID-19 virus 05/04/2020   Sleep apnea     Past Surgical History:  Procedure Laterality Date   TONSILLECTOMY     WOUND DEBRIDEMENT N/A 06/29/2020   Procedure: DEBRIDEMENT SACRAL WOUND;  Surgeon: Axel Filler, MD;  Location: [facility] OR;  Service: General;  Laterality: N/A;    There were no vitals filed for this visit.   Subjective Assessment - 03/07/21 0912     Subjective  Back to work yesterday so my arm was little more sore last night and today - shoulder mostly    Pertinent History Collin Brown is a 35 y.o. male who unfortunately contracted COVID in September 2021. He was ventilated for prolonged period of time and went in recovery notice he had bilateral arm weakness. His right upper extremity has since resolved with exception of some residual numbness and tingling in his fourth and fifth digit however he has noticed little improvement to his left upper extremity. He has had increased shoulder and elbow movement over the last couple of months, but still overall little improvement  compared to original baseline. He has been undergoing physical and occupational therapy and his pain has decreased over time. Detailed physical examination is above. However patient does have dependent edema in his left hand as well as a significant left wrist and fingers contracture. He is tender to palpation the proximal aspect of his left arm and does have anhidrosis noted with significant amount of dry skin on his left forearm. 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 C7-T1 is working, but very difficult to assess due to severe contractures and frozen joints. Would like the patient to undergo evaluation from orthopedic hand surgeon. At this point his bicep strength would be very hard to beat with a nerve transfer for elbow flexion, his main limitation in this regard is an elbow extension contracture. However possible consideration for radial to axillary nerve transfer and spinal accessory to suprascapular nerve transfer for increased movement of his shoulder depending on his progress. Potential options were discussed with the patient and his mother. We would like patient to see occupational therapist who specializes in brachial plexus injuries. We would like to see him back in clinic in approximately 2 months for reevaluation and progress. He was encouraged to reach out to [place] in the meantime for any  questions or concerns.    Patient Stated Goals Want to be able to use my L hand to bath, lift or grip , play games, put my belt on , pick up boxes and do my job    Currently in Pain? No/denies                UD and RD of wrist - done and cont with  HEP PROM table and followed by AROM over edge of table   PROM and prolonged flexion stretch for MC and PIP of L hand  - fitted last week pt with oval 8 splint to use during day some at home to decrease riks for swanneck defomity   Done soft tissue mobs and  joint mobs to L hand and wrist this date - done some carpal rolls Prior to ROM   And do table slides for wrist extention -   Used flexion glove and knuckle bender - 2 x each separate and then combination - after place and hold - done hand gripper at 10 llbs this date need max A for wrist to stay in neutral  done place and hold and AROM by OT  3 x 10 reps   Show increase MC and PIP flexion in some of the digits - pt to cont to use at home flexion glove and knuckle bender     Elbow flexion PROM done by OT -  do carry object at work   PROM and prolonged flexion stretch to MC's and PIP 'ad thumb PA  and RA                    OT Education - 03/07/21 0914     Education Details HEP review    Person(s) Educated Patient    Methods Explanation;Demonstration;Tactile cues;Verbal cues;Handout    Comprehension Verbal cues required;Returned demonstration;Verbalized understanding              OT Short Term Goals - 12/31/20 2024       OT SHORT TERM GOAL #1   Title Pt and mom to be independent in HEP to decrease edema and increase AROM in forearm to digits    Baseline no lknowledge- edema increase digits to wrist , AROM decrease in wrist , sup , digits flexion and thumb - see lfowsheet    Time 4    Period Weeks    Status New    Target Date 01/28/21               OT Long Term Goals - 12/31/20 2026       OT LONG TERM GOAL #1   Title L wrist AROM increase by 15-20 degrees for sup, extention, flexion , UD to carry or hold objects on palm and push up from chair    Baseline wrist -25 ext, flexion 52, UD 5, sup 45    Time 6    Period Weeks    Status New    Target Date 02/11/21      OT LONG TERM GOAL #2   Title L digits flexion increase by more than 20 degrees in MC and PIP to hold 2 inch cylinder objects in ADL's and at work    Baseline MC flexion 25-30 degrees, PIP's 30-45 degrees - no grip    Time 6    Period Weeks    Status New    Target Date 02/11/21      OT  LONG TERM GOAL #3   Title  L digits AROM increase for pt to touch palm to use in 25% of ADL's    Baseline see goal 2 for base line - no grip -and no using hand in any ADL's    Time 12    Period Weeks    Status New    Target Date 03/25/21      OT LONG TERM GOAL #4   Title update HEP for strengthening and proximal ROM as progress    Time 6    Period Weeks    Status New    Target Date 02/11/21                   Plan - 03/07/21 0914     Clinical Impression Statement Pt  cont to show some pain with shoulder PROM to 90 - abd more than flexion- pt maintaining his PROM at home- OT focusing mostly from elbow to hand - pt cont with mostly tightenss in [facility] more than PIP"s and 4th and 5th digits worse than 2nd and 3rd- appear to make some progress using knucklebender with flexion glove - this date done in clinic hand gripper with mod A by OT for using 4th and 5th digits - place and hold and actively -and OT maintained wrist in neutral - add last time PROM for UD and RD  and to cont with wrist extention and table slides- as wellas  knucklebender and flexion glove  - positive Tinel at ulnar N - MC of 5th - pt to cont to increase AROM and PROM in L UE - focuson elbow to hand - pt has appt with DR Franklyn Lor middle Aug    OT Occupational Profile and History Problem Focused Assessment - Including review of records relating to presenting problem    Occupational performance deficits (Please refer to evaluation for details): ADL's;IADL's;Work;Play;Leisure;Social Participation    Body Structure / Function / Physical Skills ADL;Coordination;Flexibility;Edema;Dexterity;IADL;UE functional use;Strength;Decreased knowledge of use of DME;FMC;Pain    Rehab Potential Fair    Clinical Decision Making Several treatment options, min-mod task modification necessary    Comorbidities Affecting Occupational Performance: May have comorbidities impacting occupational performance    Modification or Assistance to Complete  Evaluation  Min-Moderate modification of tasks or assist with assess necessary to complete eval    OT Frequency 2x / week    OT Duration 12 weeks    OT Treatment/Interventions Self-care/ADL training;Paraffin;Moist Heat;Fluidtherapy;Contrast Bath;Therapeutic exercise;Manual Therapy;Patient/family education;Passive range of motion;Splinting;DME and/or AE instruction    Consulted and Agree with Plan of Care Patient             Patient will benefit from skilled therapeutic intervention in order to improve the following deficits and impairments:   Body Structure / Function / Physical Skills: ADL, Coordination, Flexibility, Edema, Dexterity, IADL, UE functional use, Strength, Decreased knowledge of use of DME, FMC, Pain       Visit Diagnosis: Stiffness of left elbow, not elsewhere classified  Localized edema  Muscle weakness (generalized)  Stiffness of left shoulder, not elsewhere classified  Stiffness of left wrist, not elsewhere classified    Problem List Patient Active Problem List   Diagnosis Date Noted   Lymphedema of arm 12/11/2020   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   Sleeps in sitting position due to orthopnea 10/02/2020   Obesity with alveolar hypoventilation and body mass index (BMI) of 40 or greater (HCC) 10/02/2020   Chronic intermittent hypoxia with obstructive sleep apnea 10/02/2020   Pressure ulcer of sacral region, stage 4 (HCC)  09/05/2020   Diabetes mellitus (HCC) 09/05/2020   Dyspnea 08/23/2020   Drug induced constipation    Hypokalemia    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,  [place], [address] Phone: [phone]   Fax:  [phone]  Name: Collin Brown MRN: [id number] Date of Birth: 10/07/1985

## 2021-03-08 NOTE — Progress Notes (Signed)
Subjective:    Patient ID: Collin Brown, male    DOB: June 18, 1986, 35 y.o.   MRN: [id number]  HPI: Collin Brown is a 35 y.o. male who returns for follow up appointment for chronic pain and medication refill. He states his pain is located in his left shoulder. He rates his pain 6. His current exercise regime is walking and performing stretching exercises.  [name] Morphine equivalent is 30.00  MME.   UDS ordered today.    Pain Inventory Average Pain 3 Pain Right Now 6 My pain is constant  In the last 24 hours, has pain interfered with the following? General activity 6 Relation with others 3 Enjoyment of life 4 What TIME of day is your pain at its worst? morning  and evening Sleep (in general) Fair  Pain is worse with: walking, bending, and standing Pain improves with: rest and medication Relief from Meds: 8  Family History  Problem Relation Age of Onset   Diabetes Mellitus II Mother    Hypertension Mother    Diabetes Mother    Diabetes Mellitus II Father    High blood pressure Father    Heart attack Father    Social History   Socioeconomic History   Marital status: Single    Spouse name: Not on file   Number of children: Not on file   Years of education: Not on file   Highest education level: Not on file  Occupational History   Occupation: Risk manager  Tobacco Use   Smoking status: Never   Smokeless tobacco: Never  Vaping Use   Vaping Use: Never used  Substance and Sexual Activity   Alcohol use: No   Drug use: No   Sexual activity: Yes    Birth control/protection: None  Other Topics Concern   Not on file  Social History Narrative   Not on file   Social Determinants of Health   Financial Resource Strain: Not on file  Food Insecurity: Not on file  Transportation Needs: Not on file  Physical Activity: Not on file  Stress: Not on file  Social Connections: Not on file   Past Surgical History:  Procedure Laterality Date   TONSILLECTOMY      WOUND DEBRIDEMENT N/A 06/29/2020   Procedure: DEBRIDEMENT SACRAL WOUND;  Surgeon: Axel Filler, MD;  Location: [facility] OR;  Service: General;  Laterality: N/A;   Past Surgical History:  Procedure Laterality Date   TONSILLECTOMY     WOUND DEBRIDEMENT N/A 06/29/2020   Procedure: DEBRIDEMENT SACRAL WOUND;  Surgeon: Axel Filler, MD;  Location: [facility] OR;  Service: General;  Laterality: N/A;   Past Medical History:  Diagnosis Date   Acute blood loss anemia    Critical illness myopathy    Diabetes mellitus without complication (HCC)    Dyspnea 08/23/2020   Hearing loss in right ear    Hypertension    Pneumonia due to COVID-19 virus 05/04/2020   Sleep apnea    BP 127/82 (BP Location: Right Arm)   Pulse 83   Temp 98.5 F (36.9 C) (Oral)   Ht 5\' 10"  (1.778 m)   Wt (!) 379 lb (171.9 kg)   SpO2 94%   BMI 54.38 kg/m   Opioid Risk Score:   Fall Risk Score:  `1  Depression screen PHQ 2/9  Depression screen [facility] 2/9 01/29/2021 01/01/2021 12/31/2020 12/20/2020 11/30/2020 11/20/2020 11/08/2020  Decreased Interest 0 0 0 0 2 2 1   Down, Depressed, Hopeless 0 0 0 0 1  1 1  PHQ - 2 Score 0 0 0 0 3 3 2   Altered sleeping - - - - 0 0 1  Tired, decreased energy - - - - 2 2 -  Change in appetite - - - - 3 3 -  Feeling bad or failure about yourself  - - - - 2 2 -  Trouble concentrating - - - - 0 0 -  Moving slowly or fidgety/restless - - - - 1 1 -  Suicidal thoughts - - - - 0 0 -  PHQ-9 Score - - - - 11 11 3   Difficult doing work/chores - - - - Somewhat difficult Somewhat difficult -       Review of Systems  Constitutional: Negative.   HENT: Negative.    Eyes: Negative.   Respiratory: Negative.    Cardiovascular: Negative.   Gastrointestinal: Negative.   Endocrine: Negative.   Genitourinary: Negative.   Musculoskeletal:        Ain in left arm  Skin: Negative.   Allergic/Immunologic: Negative.   Neurological: Negative.   Hematological: Negative.   Psychiatric/Behavioral: Negative.         Objective:   Physical Exam Vitals and nursing note reviewed.  Constitutional:      Appearance: Normal appearance. He is obese.  Cardiovascular:     Rate and Rhythm: Normal rate and regular rhythm.     Pulses: Normal pulses.     Heart sounds: Normal heart sounds.  Musculoskeletal:     Comments: Normal Muscle Bulk and Muscle Testing Reveals:  Upper Extremities: Right: Full ROM and Muscle Strength 5/5 Left Upper Extremity: Decreased ROM 30 Degrees and Muscle Strength 3/5 Wearing left hand compression sleeve Lower Extremities: Full ROM and Muscle Strength 5/5 Arises from Table slowly Narrow Based  Gait     Skin:    General: Skin is warm and dry.  Neurological:     Mental Status: He is alert and oriented to person, place, and time.  Psychiatric:        Mood and Affect: Mood normal.        Behavior: Behavior normal.         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 Continue to monitor. 03/08/2021 2. Chronic Low Back Pain without Sciatica: No complaints today. Continue HEP as Tolerated. Continue to monitor. 03/08/2021 3. Sacral Wound: Continue to monitor. 03/08/2021 4. Chronic Pain Syndrome: Refilled: Oxycodone 5/325 mg mg one tablet every 6 hours as needed for pain #120.  We will continue the opioid monitoring program, this consists of regular clinic visits, examinations, urine drug screen, pill counts as well as use of 03/10/2021 Controlled Substance Reporting system. A 12 month History has been reviewed on the 03/10/2021 Controlled Substance Reporting System on 03/08/2021.  F/U in 1 month

## 2021-03-12 ENCOUNTER — Ambulatory Visit: Payer: BC Managed Care – PPO | Attending: Family Medicine | Admitting: Occupational Therapy

## 2021-03-12 DIAGNOSIS — M25622 Stiffness of left elbow, not elsewhere classified: Secondary | ICD-10-CM | POA: Diagnosis present

## 2021-03-12 DIAGNOSIS — M6281 Muscle weakness (generalized): Secondary | ICD-10-CM | POA: Diagnosis present

## 2021-03-12 DIAGNOSIS — M25632 Stiffness of left wrist, not elsewhere classified: Secondary | ICD-10-CM | POA: Diagnosis present

## 2021-03-12 DIAGNOSIS — M25642 Stiffness of left hand, not elsewhere classified: Secondary | ICD-10-CM | POA: Insufficient documentation

## 2021-03-12 DIAGNOSIS — R600 Localized edema: Secondary | ICD-10-CM | POA: Insufficient documentation

## 2021-03-12 DIAGNOSIS — M25612 Stiffness of left shoulder, not elsewhere classified: Secondary | ICD-10-CM | POA: Insufficient documentation

## 2021-03-12 NOTE — Therapy (Signed)
[facility] [address] [place], [place], [address] Phone: [phone]   Fax:  [phone]  Occupational Therapy Treatment  Patient Details  Name: Collin HuaMatthew M Bureau MRN: [id number] Date of Birth: 28-May-1986 Referring Provider (OT): Dr Vladimir CreeksBacigalopo,   Encounter Date: 03/12/2021   OT End of Session - 03/12/21 1838     Visit Number 17    Number of Visits 24    Date for OT Re-Evaluation 03/25/21    OT Start Time 0905    OT Stop Time 0945    OT Time Calculation (min) 40 min    Activity Tolerance Patient tolerated treatment well    Behavior During Therapy [facility] for tasks assessed/performed             Past Medical History:  Diagnosis Date   Acute blood loss anemia    Critical illness myopathy    Diabetes mellitus without complication (HCC)    Dyspnea 08/23/2020   Hearing loss in right ear    Hypertension    Pneumonia due to COVID-19 virus 05/04/2020   Sleep apnea     Past Surgical History:  Procedure Laterality Date   TONSILLECTOMY     WOUND DEBRIDEMENT N/A 06/29/2020   Procedure: DEBRIDEMENT SACRAL WOUND;  Surgeon: Axel Filleramirez, Armando, MD;  [facility];  Service: General;  Laterality: N/A;    There were no vitals filed for this visit.   Subjective Assessment - 03/12/21 0910     Subjective  Still using hand in games on xbox- can carry drink and plate - can wash my R arm and putting on deodeorant - pain in arm little more when at work can inrease to 7-8/10 , but off days 3/10 at the worse -    Pertinent History Collin Brown is a 35 y.o. male who unfortunately contracted COVID in September 2021. He was ventilated for prolonged period of time and went in recovery notice he had bilateral arm weakness. His right upper extremity has since resolved with exception of some residual numbness and tingling in his fourth and fifth digit however he has noticed little improvement to his left upper extremity. He has  had increased shoulder and elbow movement over the last couple of months, but still overall little improvement compared to original baseline. He has been undergoing physical and occupational therapy and his pain has decreased over time. Detailed physical examination is above. However patient does have dependent edema in his left hand as well as a significant left wrist and fingers contracture. He is tender to palpation the proximal aspect of his left arm and does have anhidrosis noted with significant amount of dry skin on his left forearm. 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 C7-T1 is working, but very difficult to assess due to severe contractures and frozen joints. Would like the patient to undergo evaluation from orthopedic hand surgeon. At this point his bicep strength would be very hard to beat with a nerve transfer for elbow flexion, his main limitation in this regard is an elbow extension contracture. However possible consideration for radial to axillary nerve transfer and spinal accessory to suprascapular nerve transfer for increased movement of his shoulder depending on his progress. Potential options were discussed with the patient and his mother. We would like patient to see occupational therapist who specializes in brachial plexus injuries. We would like to  see him back in clinic in approximately 2 months for reevaluation and progress. He was encouraged to reach out to [place] in the meantime for any questions or concerns.    Patient Stated Goals Want to be able to use my L hand to bath, lift or grip , play games, put my belt on , pick up boxes and do my job    Currently in Pain? No/denies                          OT Treatments/Exercises (OP) - 03/12/21 0001       Moist Heat Therapy   Number Minutes Moist Heat 8 Minutes    Moist Heat Location Hand   elbow              UD and RD of wrist cont with  HEP PROM table and followed by AROM over edge of table   PROM and prolonged flexion stretch for MC and PIP of L hand  - fitted  2 wks ago  with oval 8 splint to use during day some at home to decrease riks for swanneck defomity   Done soft tissue mobs and joint mobs to L hand and wrist this date - done some carpal rolls Prior to ROM    Used flexion glove and knuckle bender - 2 x each separate and then combination - after place and hold -10 reps   Show increase MC and PIP flexion in some of the digits - pt to cont to use at home flexion glove and knuckle bender     Elbow flexion PROM done by OT and pt after heat  2 1/2 lbs weight from 90 flexion to place and hold end range by OT - 12 reps upgrade to HEP Assess wrist extention- table sldies 20 reps  2 1/2 lbs place and hold - weight with  strap around hand - 12 reps Pronation and supination- with sup on table support with min A for end range 12 reps Add to and upgrade to HEP     PROM and prolonged flexion stretch to MC's and PIP 'ad thumb PA  and RA         OT Education - 03/12/21 1838     Education Details HEP changes    Person(s) Educated Patient    Methods Explanation;Demonstration;Tactile cues;Verbal cues;Handout    Comprehension Verbal cues required;Returned demonstration;Verbalized understanding              OT Short Term Goals - 12/31/20 2024       OT SHORT TERM GOAL #1   Title Pt and mom to be independent in HEP to decrease edema and increase AROM in forearm to digits    Baseline no lknowledge- edema increase digits to wrist , AROM decrease in wrist , sup , digits flexion and thumb - see lfowsheet    Time 4    Period Weeks    Status New    Target Date 01/28/21               OT Long Term Goals - 12/31/20 2026       OT LONG TERM GOAL #1   Title L wrist AROM increase by 15-20 degrees for sup, extention, flexion , UD to carry or hold objects on palm and push up from  chair    Baseline wrist -25 ext, flexion 52, UD 5, sup 45    Time 6    Period  Weeks    Status New    Target Date 02/11/21      OT LONG TERM GOAL #2   Title L digits flexion increase by more than 20 degrees in MC and PIP to hold 2 inch cylinder objects in ADL's and at work    Baseline MC flexion 25-30 degrees, PIP's 30-45 degrees - no grip    Time 6    Period Weeks    Status New    Target Date 02/11/21      OT LONG TERM GOAL #3   Title L digits AROM increase for pt to touch palm to use in 25% of ADL's    Baseline see goal 2 for base line - no grip -and no using hand in any ADL's    Time 12    Period Weeks    Status New    Target Date 03/25/21      OT LONG TERM GOAL #4   Title update HEP for strengthening and proximal ROM as progress    Time 6    Period Weeks    Status New    Target Date 02/11/21                   Plan - 03/12/21 1839     Clinical Impression Statement Pt  cont to show some pain with shoulder PROM to 90 - abd more than flexion- pt maintaining his PROM at home- OT focusing mostly from elbow to hand - pt cont with mostly tightenss in [facility] more than PIP"s and 4th and 5th digits worse than 2nd and 3rd- appear to make some progress using knucklebender with flexion glove - this date  focus on clinic wrist extention stretches and increase strenghtneing for , wrist ext, forearm and elbow flexion  to 2 1/2 lbs- but need to be weight wtih strap around hand - add last week  PROM for UD and RD  and to cont with wrist extention and table slides- as well as  knucklebender and flexion glove  - positive Tinel at ulnar N - MC of 5th - pt to cont to increase AROM and PROM in L UE - focuson elbow to hand - pt has appt with DR Franklyn Lor middle Aug    OT Occupational Profile and History Problem Focused Assessment - Including review of records relating to presenting problem    Occupational performance deficits (Please refer to evaluation for details):  ADL's;IADL's;Work;Play;Leisure;Social Participation    Body Structure / Function / Physical Skills ADL;Coordination;Flexibility;Edema;Dexterity;IADL;UE functional use;Strength;Decreased knowledge of use of DME;FMC;Pain    Rehab Potential Fair    Clinical Decision Making Several treatment options, min-mod task modification necessary    Comorbidities Affecting Occupational Performance: May have comorbidities impacting occupational performance    Modification or Assistance to Complete Evaluation  Min-Moderate modification of tasks or assist with assess necessary to complete eval    OT Frequency 2x / week    OT Duration 12 weeks    OT Treatment/Interventions Self-care/ADL training;Paraffin;Moist Heat;Fluidtherapy;Contrast Bath;Therapeutic exercise;Manual Therapy;Patient/family education;Passive range of motion;Splinting;DME and/or AE instruction    Consulted and Agree with Plan of Care Patient             Patient will benefit from skilled therapeutic intervention in order to improve the following deficits and impairments:   Body Structure / Function / Physical Skills: ADL, Coordination, Flexibility, Edema, Dexterity, IADL, UE functional use, Strength, Decreased knowledge of use of DME, FMC, Pain       Visit Diagnosis:  Stiffness of left elbow, not elsewhere classified  Localized edema  Muscle weakness (generalized)  Stiffness of left wrist, not elsewhere classified  Stiffness of left hand, not elsewhere classified    Problem List Patient Active Problem List   Diagnosis Date Noted   Lymphedema of arm 12/11/2020   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   Sleeps in sitting position due to orthopnea 10/02/2020   Obesity with alveolar hypoventilation and body mass index (BMI) of 40 or greater (HCC) 10/02/2020   Chronic intermittent hypoxia with obstructive sleep apnea 10/02/2020   Pressure ulcer of sacral region, stage 4 (HCC) 09/05/2020   Diabetes mellitus (HCC) 09/05/2020    Dyspnea 08/23/2020   Drug induced constipation    Hypokalemia    COVID-19 long hauler manifesting chronic decreased mobility and endurance 06/17/2020   Debility 06/08/2020   Critical illness myopathy    Supplemental oxygen dependent    Essential hypertension    Brachial plexopathy    Neuropathic pain    Sleep apnea     Oletta Cohn OTR/L,CLT 03/12/2021, 6:42 PM  [facility] [address], [place], [address] Phone: [phone]   Fax:  [phone]  Name: ISACC TURNEY MRN: [id number] Date of Birth: July 20, 1986

## 2021-03-14 LAB — TOXASSURE SELECT,+ANTIDEPR,UR

## 2021-03-15 ENCOUNTER — Other Ambulatory Visit: Payer: Self-pay

## 2021-03-15 ENCOUNTER — Ambulatory Visit: Payer: BC Managed Care – PPO | Admitting: Occupational Therapy

## 2021-03-15 ENCOUNTER — Telehealth: Payer: Self-pay | Admitting: *Deleted

## 2021-03-15 DIAGNOSIS — M6281 Muscle weakness (generalized): Secondary | ICD-10-CM

## 2021-03-15 DIAGNOSIS — M25632 Stiffness of left wrist, not elsewhere classified: Secondary | ICD-10-CM

## 2021-03-15 DIAGNOSIS — M25612 Stiffness of left shoulder, not elsewhere classified: Secondary | ICD-10-CM

## 2021-03-15 DIAGNOSIS — R600 Localized edema: Secondary | ICD-10-CM

## 2021-03-15 DIAGNOSIS — M25622 Stiffness of left elbow, not elsewhere classified: Secondary | ICD-10-CM

## 2021-03-15 DIAGNOSIS — M25642 Stiffness of left hand, not elsewhere classified: Secondary | ICD-10-CM

## 2021-03-15 NOTE — Therapy (Signed)
[place] [facility] [address] [place], [address] Phone: [phone]   Fax:  [phone]  Occupational Therapy Treatment  Patient Details  Name: CARDEN TEEL MRN: [id number] Date of Birth: Nov 23, 1985 Referring Provider (OT): Dr Vladimir Creeks,   Encounter Date: 03/15/2021   OT End of Session - 03/15/21 0938     Visit Number 18    Number of Visits 24    Date for OT Re-Evaluation 03/25/21    OT Start Time 0916    OT Stop Time 1005    OT Time Calculation (min) 49 min    Activity Tolerance Patient tolerated treatment well    Behavior During Therapy [facility] for tasks assessed/performed             Past Medical History:  Diagnosis Date   Acute blood loss anemia    Critical illness myopathy    Diabetes mellitus without complication (HCC)    Dyspnea 08/23/2020   Hearing loss in right ear    Hypertension    Pneumonia due to COVID-19 virus 05/04/2020   Sleep apnea     Past Surgical History:  Procedure Laterality Date   TONSILLECTOMY     WOUND DEBRIDEMENT N/A 06/29/2020   Procedure: DEBRIDEMENT SACRAL WOUND;  Surgeon: Axel Filler, MD;  Location: [facility] OR;  Service: General;  Laterality: N/A;    There were no vitals filed for this visit.   Subjective Assessment - 03/15/21 0935     Subjective  I can touch my face now when I help - this is the first time- and did bring my wrist splint in for you to pad - it cause pain when I use it    Pertinent History Izaia Say is a 35 y.o. male who unfortunately contracted COVID in September 2021. He was ventilated for prolonged period of time and went in recovery notice he had bilateral arm weakness. His right upper extremity has since resolved with exception of some residual numbness and tingling in his fourth and fifth digit however he has noticed little improvement to his left upper extremity. He has had increased shoulder and elbow movement over the last couple of  months, but still overall little improvement compared to original baseline. He has been undergoing physical and occupational therapy and his pain has decreased over time. Detailed physical examination is above. However patient does have dependent edema in his left hand as well as a significant left wrist and fingers contracture. He is tender to palpation the proximal aspect of his left arm and does have anhidrosis noted with significant amount of dry skin on his left forearm. 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    Mr. 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 C7-T1 is working, but very difficult to assess due to severe contractures and frozen joints. Would like the patient to undergo evaluation from orthopedic hand surgeon. At this point his bicep strength would be very hard to beat with a nerve transfer for elbow flexion, his main limitation in this regard is an elbow extension contracture. However possible consideration for radial to axillary nerve transfer and spinal accessory to suprascapular nerve transfer for increased movement of his shoulder depending on his progress. Potential options were discussed with the patient and his mother. We would like patient to see occupational therapist who specializes in brachial plexus injuries. We would like to see him back in clinic in approximately 2 months for  reevaluation and progress. He was encouraged to reach out to [place] in the meantime for any questions or concerns.    Patient Stated Goals Want to be able to use my L hand to bath, lift or grip , play games, put my belt on , pick up boxes and do my job    Currently in Pain? No/denies                [facility] OT Assessment - 03/15/21 0001       AROM   Left Elbow Flexion 105   110 PROM   Left Wrist Extension 35 Degrees      Strength   Right Hand Lateral Pinch 27 lbs    Right Hand 3 Point Pinch 20 lbs    Left Hand Grip (lbs) 15   19 with wrist brace    Left Hand Lateral Pinch 13 lbs    Left Hand 3 Point Pinch 6 lbs   with wrist brace 10           Pt show increase grip and lat grip -  And increase grip and 2-3 point pinch with wrist splint on  See flow sheet           OT Treatments/Exercises (OP) - 03/15/21 0001       Moist Heat Therapy   Number Minutes Moist Heat 8 Minutes    Moist Heat Location --   hand to elbow            UD and RD  PROM and then 2 1/2 lbs weight over edge of table- place and hold 10 reps And AAROM palms together prior to 2 1/2 lbs   Cont with it at home    PROM and prolonged flexion stretch for MC and PIP of L hand by OT  - fPt to cont with oval 8 splint to use during day some at home to decrease riks for swanneck defomity Cont to useflexion glove and knuckle bender - 2 x each separate and then combination - after place and hold -10 reps   Show increase MC and PIP flexion in some of the digits      Elbow flexion PROM done by OT and pt after heat - can do interlocking at fingers and do it towards ears 10 reps hold 10-30 sec  2 1/2 lbs weight from 90 flexion to place and hold end range by OT - 12 reps and cont HEP Assess wrist extention- table sldies 20 reps  2 1/2 lbs place and hold - weight with  strap around hand - 10 reps or less reps but more sets  Pronation and supination- with sup on table support with min A for end range 12 reps      PROM and prolonged flexion stretch to MC's and PIP 'ad thumb PA  and RA         OT Education - 03/15/21 0938     Education Details HEP changes and progress    Person(s) Educated Patient    Methods Explanation;Demonstration;Tactile cues;Verbal cues;Handout    Comprehension Verbal cues required;Returned demonstration;Verbalized understanding              OT Short Term Goals - 12/31/20 2024       OT SHORT TERM GOAL #1   Title Pt and mom to be independent in HEP to decrease edema and increase AROM in forearm to digits    Baseline no  lknowledge- edema increase digits to wrist , AROM  decrease in wrist , sup , digits flexion and thumb - see lfowsheet    Time 4    Period Weeks    Status New    Target Date 01/28/21               OT Long Term Goals - 12/31/20 2026       OT LONG TERM GOAL #1   Title L wrist AROM increase by 15-20 degrees for sup, extention, flexion , UD to carry or hold objects on palm and push up from chair    Baseline wrist -25 ext, flexion 52, UD 5, sup 45    Time 6    Period Weeks    Status New    Target Date 02/11/21      OT LONG TERM GOAL #2   Title L digits flexion increase by more than 20 degrees in MC and PIP to hold 2 inch cylinder objects in ADL's and at work    Baseline MC flexion 25-30 degrees, PIP's 30-45 degrees - no grip    Time 6    Period Weeks    Status New    Target Date 02/11/21      OT LONG TERM GOAL #3   Title L digits AROM increase for pt to touch palm to use in 25% of ADL's    Baseline see goal 2 for base line - no grip -and no using hand in any ADL's    Time 12    Period Weeks    Status New    Target Date 03/25/21      OT LONG TERM GOAL #4   Title update HEP for strengthening and proximal ROM as progress    Time 6    Period Weeks    Status New    Target Date 02/11/21                   Plan - 03/15/21 3335     Clinical Impression Statement Pt  cont to whow but steady progress in wrist AROM , strength- as well as grip and lat grip - pt has more 2-3 point pinch with wrist splint on for stability - and report able to touch his face now with PROM of elbow flexion- focussing on elbow to hand ROM and strength until pt has appt with Dr Franklyn Lor about hand surgery for increase digits ROM - pt maintaining his shoulder ROM    OT Occupational Profile and History Problem Focused Assessment - Including review of records relating to presenting problem    Occupational performance deficits (Please refer to evaluation for details): ADL's;IADL's;Work;Play;Leisure;Social  Participation    Body Structure / Function / Physical Skills ADL;Coordination;Flexibility;Edema;Dexterity;IADL;UE functional use;Strength;Decreased knowledge of use of DME;FMC;Pain    Rehab Potential Fair    Clinical Decision Making Several treatment options, min-mod task modification necessary    Comorbidities Affecting Occupational Performance: May have comorbidities impacting occupational performance    Modification or Assistance to Complete Evaluation  Min-Moderate modification of tasks or assist with assess necessary to complete eval    OT Frequency 2x / week    OT Duration 12 weeks    OT Treatment/Interventions Self-care/ADL training;Paraffin;Moist Heat;Fluidtherapy;Contrast Bath;Therapeutic exercise;Manual Therapy;Patient/family education;Passive range of motion;Splinting;DME and/or AE instruction    Consulted and Agree with Plan of Care Patient             Patient will benefit from skilled therapeutic intervention in order to improve the following deficits and impairments:   Body Structure /  Function / Physical Skills: ADL, Coordination, Flexibility, Edema, Dexterity, IADL, UE functional use, Strength, Decreased knowledge of use of DME, FMC, Pain       Visit Diagnosis: Stiffness of left elbow, not elsewhere classified  Localized edema  Muscle weakness (generalized)  Stiffness of left wrist, not elsewhere classified  Stiffness of left hand, not elsewhere classified  Stiffness of left shoulder, not elsewhere classified    Problem List Patient Active Problem List   Diagnosis Date Noted   Lymphedema of arm 12/11/2020   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   Sleeps in sitting position due to orthopnea 10/02/2020   Obesity with alveolar hypoventilation and body mass index (BMI) of 40 or greater (HCC) 10/02/2020   Chronic intermittent hypoxia with obstructive sleep apnea 10/02/2020   Pressure ulcer of sacral region, stage 4 (HCC) 09/05/2020   Diabetes mellitus (HCC)  09/05/2020   Dyspnea 08/23/2020   Drug induced constipation    Hypokalemia    COVID-19 long hauler manifesting chronic decreased mobility and endurance 06/17/2020   Debility 06/08/2020   Critical illness myopathy    Supplemental oxygen dependent    Essential hypertension    Brachial plexopathy    Neuropathic pain    Sleep apnea     Oletta CohnuPreez, Larisha Vencill OTR/L,CLT 03/15/2021, 10:55 AM  [facility] [address] [place], [place], [address] Phone: [phone]   Fax:  [phone]  Name: Julaine HuaMatthew M Mancia MRN: [id number] Date of Birth: [birth year]

## 2021-03-15 NOTE — Telephone Encounter (Signed)
Urine drug screen is positive for his prescribed oxycodone but is also positive for a benzodiazapine --looks like diazepam- because metabolites are present.  "Desmethyldiazepam, oxazepam, and temazepam are benzodiazepine drugs,but may also be present as common metabolites of other benzodiazepine drugs, including diazepam."  Per the PMP he has not been prescribed this medication.

## 2021-03-18 ENCOUNTER — Encounter
Payer: BC Managed Care – PPO | Attending: Physical Medicine and Rehabilitation | Admitting: Physical Medicine and Rehabilitation

## 2021-03-18 ENCOUNTER — Ambulatory Visit: Payer: BC Managed Care – PPO | Admitting: Occupational Therapy

## 2021-03-18 ENCOUNTER — Encounter: Payer: Self-pay | Admitting: Family Medicine

## 2021-03-18 ENCOUNTER — Other Ambulatory Visit: Payer: Self-pay

## 2021-03-18 DIAGNOSIS — G894 Chronic pain syndrome: Secondary | ICD-10-CM | POA: Insufficient documentation

## 2021-03-18 DIAGNOSIS — M6281 Muscle weakness (generalized): Secondary | ICD-10-CM

## 2021-03-18 DIAGNOSIS — G540 Brachial plexus disorders: Secondary | ICD-10-CM | POA: Diagnosis not present

## 2021-03-18 DIAGNOSIS — M25642 Stiffness of left hand, not elsewhere classified: Secondary | ICD-10-CM

## 2021-03-18 DIAGNOSIS — M25632 Stiffness of left wrist, not elsewhere classified: Secondary | ICD-10-CM

## 2021-03-18 DIAGNOSIS — M25622 Stiffness of left elbow, not elsewhere classified: Secondary | ICD-10-CM | POA: Diagnosis not present

## 2021-03-18 DIAGNOSIS — R600 Localized edema: Secondary | ICD-10-CM

## 2021-03-18 NOTE — Therapy (Signed)
[place] [facility] [address], [place], [address] Phone: [phone]   Fax:  [phone]  Occupational Therapy Treatment  Patient Details  Name: Collin Brown MRN: [id number] Date of Birth: 01-26-1986 Referring Provider (OT): Dr Vladimir Creeks,   Encounter Date: 03/18/2021   OT End of Session - 03/18/21 1212     Visit Number 19    Number of Visits 24    Date for OT Re-Evaluation 03/25/21    OT Start Time 1120    OT Stop Time 1203    OT Time Calculation (min) 43 min    Activity Tolerance Patient tolerated treatment well    Behavior During Therapy [facility] for tasks assessed/performed             Past Medical History:  Diagnosis Date   Acute blood loss anemia    Critical illness myopathy    Diabetes mellitus without complication (HCC)    Dyspnea 08/23/2020   Hearing loss in right ear    Hypertension    Pneumonia due to COVID-19 virus 05/04/2020   Sleep apnea     Past Surgical History:  Procedure Laterality Date   TONSILLECTOMY     WOUND DEBRIDEMENT N/A 06/29/2020   Procedure: DEBRIDEMENT SACRAL WOUND;  Surgeon: Axel Filler, MD;  Location: [facility] OR;  Service: General;  Laterality: N/A;    There were no vitals filed for this visit.   Subjective Assessment - 03/18/21 1209     Subjective  Doing okay - they are trying to wean me off my pain medication - now only on Gabapentin - my shoulder and back of arm hurts during the day when I stand and work - appt with Dr Franklyn Lor on Monday    Pertinent History Collin Brown is a 35 y.o. male who unfortunately contracted COVID in September 2021. He was ventilated for prolonged period of time and went in recovery notice he had bilateral arm weakness. His right upper extremity has since resolved with exception of some residual numbness and tingling in his fourth and fifth digit however he has noticed little improvement to his left upper extremity. He has had increased  shoulder and elbow movement over the last couple of months, but still overall little improvement compared to original baseline. He has been undergoing physical and occupational therapy and his pain has decreased over time. Detailed physical examination is above. However patient does have dependent edema in his left hand as well as a significant left wrist and fingers contracture. He is tender to palpation the proximal aspect of his left arm and does have anhidrosis noted with significant amount of dry skin on his left forearm. 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    Mr. 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 C7-T1 is working, but very difficult to assess due to severe contractures and frozen joints. Would like the patient to undergo evaluation from orthopedic hand surgeon. At this point his bicep strength would be very hard to beat with a nerve transfer for elbow flexion, his main limitation in this regard is an elbow extension contracture. However possible consideration for radial to axillary nerve transfer and spinal accessory to suprascapular nerve transfer for increased movement of his shoulder depending on his progress. Potential options were discussed with the patient and his mother. We would like patient to see occupational therapist who specializes in brachial plexus injuries. We would like to see him back  in clinic in approximately 2 months for reevaluation and progress. He was encouraged to reach out to [place] in the meantime for any questions or concerns.    Patient Stated Goals Want to be able to use my L hand to bath, lift or grip , play games, put my belt on , pick up boxes and do my job    Currently in Pain? No/denies                 LYMPHEDEMA/ONCOLOGY QUESTIONNAIRE - 03/18/21 0001       Right Upper Extremity Lymphedema   10 cm Proximal to Olecranon Process 45.5 cm    Olecranon Process 38 cm      Left Upper Extremity  Lymphedema   10 cm Proximal to Olecranon Process 52 cm    Olecranon Process 36 cm            This date assess circumference - pt using isotoner glove and Tubigrip D on hand to elbow- could not tolerate on upper arm - rolling before larger tubigrip - this date reassess and pt tender over Triceps area and increase circumference in upper arm - see flow sheet  Recommend at this time compression sleeve and glove- 20-30 mmHg to wear daytime- talked to [employer] -will measure and fit pt- need insurance info and prescription - pt will contact his PCP     PROM and prolonged flexion stretch for MC and PIP of L hand     Done soft tissue mobs and joint mobs to L hand/digits and wrist this date - done some carpal rolls Prior to ROM    Used flexion glove and knuckle bender - 2 x each separate and then combination 2 min each- place and hold -10 reps   Show increase MC and PIP flexion in some of the digits - pt to cont to use at home flexion glove and knuckle bender Afterwards assist - place and hold hand gripper 10 reps at 10 lbs - OT max A to keep wrist neutral -place and hold digits - unable to maintain flexion of 5th- decrease sensation per pt                OT Education - 03/18/21 1212     Education Details HEP changes and progress    Person(s) Educated Patient    Methods Explanation;Demonstration;Tactile cues;Verbal cues;Handout    Comprehension Verbal cues required;Returned demonstration;Verbalized understanding              OT Short Term Goals - 12/31/20 2024       OT SHORT TERM GOAL #1   Title Pt and mom to be independent in HEP to decrease edema and increase AROM in forearm to digits    Baseline no lknowledge- edema increase digits to wrist , AROM decrease in wrist , sup , digits flexion and thumb - see lfowsheet    Time 4    Period Weeks    Status New    Target Date 01/28/21               OT Long Term Goals - 12/31/20 2026       OT LONG TERM GOAL  #1   Title L wrist AROM increase by 15-20 degrees for sup, extention, flexion , UD to carry or hold objects on palm and push up from chair    Baseline wrist -25 ext, flexion 52, UD 5, sup 45    Time 6    Period Weeks  Status New    Target Date 02/11/21      OT LONG TERM GOAL #2   Title L digits flexion increase by more than 20 degrees in MC and PIP to hold 2 inch cylinder objects in ADL's and at work    Baseline MC flexion 25-30 degrees, PIP's 30-45 degrees - no grip    Time 6    Period Weeks    Status New    Target Date 02/11/21      OT LONG TERM GOAL #3   Title L digits AROM increase for pt to touch palm to use in 25% of ADL's    Baseline see goal 2 for base line - no grip -and no using hand in any ADL's    Time 12    Period Weeks    Status New    Target Date 03/25/21      OT LONG TERM GOAL #4   Title update HEP for strengthening and proximal ROM as progress    Time 6    Period Weeks    Status New    Target Date 02/11/21                   Plan - 03/18/21 1212     Clinical Impression Statement Pt  cont to show but steady progress in wrist AROM , strength- as well as grip and lat grip -report able to touch his face now with PROM of elbow flexion- focussing on elbow to hand ROM and strength until pt has appt with Dr Franklyn Loruch about hand surgery for increase digits ROM - after flexion glove and knuckle bender - pt do show increase AROM in digits but cont to have mostly stiffness in MC's and 4th and 5th digits- cont decrease sensation in 5th -  pt maintaining his shoulder ROM- pt to stop by [employer] and get measure for compression sleeve for L UE and glove to provided compression for upper arm - this date increaes by 6 cm - could be cause of decrease elbow flexion and pain over tricep    OT Occupational Profile and History Problem Focused Assessment - Including review of records relating to presenting problem    Occupational performance deficits (Please refer to evaluation for  details): ADL's;IADL's;Work;Play;Leisure;Social Participation    Body Structure / Function / Physical Skills ADL;Coordination;Flexibility;Edema;Dexterity;IADL;UE functional use;Strength;Decreased knowledge of use of DME;FMC;Pain    Rehab Potential Fair    Clinical Decision Making Several treatment options, min-mod task modification necessary    Comorbidities Affecting Occupational Performance: May have comorbidities impacting occupational performance    Modification or Assistance to Complete Evaluation  Min-Moderate modification of tasks or assist with assess necessary to complete eval    OT Frequency 2x / week    OT Duration 12 weeks    OT Treatment/Interventions Self-care/ADL training;Paraffin;Moist Heat;Fluidtherapy;Contrast Bath;Therapeutic exercise;Manual Therapy;Patient/family education;Passive range of motion;Splinting;DME and/or AE instruction    Consulted and Agree with Plan of Care Patient             Patient will benefit from skilled therapeutic intervention in order to improve the following deficits and impairments:   Body Structure / Function / Physical Skills: ADL, Coordination, Flexibility, Edema, Dexterity, IADL, UE functional use, Strength, Decreased knowledge of use of DME, FMC, Pain       Visit Diagnosis: Stiffness of left elbow, not elsewhere classified  Localized edema  Muscle weakness (generalized)  Stiffness of left wrist, not elsewhere classified  Stiffness of left hand, not elsewhere classified  Problem List Patient Active Problem List   Diagnosis Date Noted   Lymphedema of arm 12/11/2020   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   Sleeps in sitting position due to orthopnea 10/02/2020   Obesity with alveolar hypoventilation and body mass index (BMI) of 40 or greater (HCC) 10/02/2020   Chronic intermittent hypoxia with obstructive sleep apnea 10/02/2020   Pressure ulcer of sacral region, stage 4 (HCC) 09/05/2020   Diabetes mellitus (HCC)  09/05/2020   Dyspnea 08/23/2020   Drug induced constipation    Hypokalemia    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, [place], [address] Phone: [phone]   Fax:  [phone]  Name: SHIVANK PINEDO MRN: [id number] Date of Birth: 04-11-1986

## 2021-03-18 NOTE — Telephone Encounter (Signed)
Ok to write Rx and I'll sign it

## 2021-03-18 NOTE — Progress Notes (Signed)
Subjective:    Patient ID: Collin Brown, male    DOB: Aug 26, 1985, 35 y.o.   MRN: [id number]  HPI:  An audio/video tele-health visit is felt to be the most appropriate encounter for this patient at this time. This is a follow up tele-visit via phone. The patient is at home. MD is at office.    Collin Brown is a 35 y.o. 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 He states his pain is located in his left shoulder. He rates his pain 6. His current exercise regime is walking and performing stretching exercises. He has been taking his oxycodone 5mg  q6H PRN.    Pain Inventory Average Pain 3 Pain Right Now 6 My pain is constant  In the last 24 hours, has pain interfered with the following? General activity 6 Relation with others 3 Enjoyment of life 4 What TIME of day is your pain at its worst? morning  and evening Sleep (in general) Fair  Pain is worse with: walking, bending, and standing Pain improves with: rest and medication Relief from Meds: 8  Family History  Problem Relation Age of Onset   Diabetes Mellitus II Mother    Hypertension Mother    Diabetes Mother    Diabetes Mellitus II Father    High blood pressure Father    Heart attack Father    Social History   Socioeconomic History   Marital status: Single    Spouse name: Not on file   Number of children: Not on file   Years of education: Not on file   Highest education level: Not on file  Occupational History   Occupation:  Tobacco Use   Smoking status: Never   Smokeless tobacco: Never  Vaping Use   Vaping Use: Never used  Substance and Sexual Activity   Alcohol use: No   Drug use: No   Sexual activity: Yes    Birth control/protection: None  Other Topics Concern   Not on file  Social History Narrative   Not on file   Social Determinants of Health   Financial Resource Strain: Not on  file  Food Insecurity: Not on file  Transportation Needs: Not on file  Physical Activity: Not on file  Stress: Not on file  Social Connections: Not on file   Past Surgical History:  Procedure Laterality Date   TONSILLECTOMY     WOUND DEBRIDEMENT N/A 06/29/2020   Procedure: DEBRIDEMENT SACRAL WOUND;  Surgeon: 07/01/2020, MD;  Location: [facility] OR;  Service: General;  Laterality: N/A;   Past Surgical History:  Procedure Laterality Date   TONSILLECTOMY     WOUND DEBRIDEMENT N/A 06/29/2020   Procedure: DEBRIDEMENT SACRAL WOUND;  Surgeon: 07/01/2020, MD;  Location: [facility] OR;  Service: General;  Laterality: N/A;   Past Medical History:  Diagnosis Date   Acute blood loss anemia    Critical illness myopathy    Diabetes mellitus without complication (HCC)    Dyspnea 08/23/2020   Hearing loss in right ear    Hypertension    Pneumonia due to COVID-19 virus 05/04/2020   Sleep apnea    There were no vitals taken for this visit.  Opioid Risk Score:   Fall Risk Score:  `1  Depression screen PHQ 2/9  Depression screen [facility] 2/9 03/08/2021 01/29/2021 01/01/2021 12/31/2020 12/20/2020 11/30/2020 11/20/2020  Decreased Interest 0 0 0 0 0 2 2  Down,  Depressed, Hopeless 0 0 0 0 0 1 1  PHQ - 2 Score 0 0 0 0 0 3 3  Altered sleeping - - - - - 0 0  Tired, decreased energy - - - - - 2 2  Change in appetite - - - - - 3 3  Feeling bad or failure about yourself  - - - - - 2 2  Trouble concentrating - - - - - 0 0  Moving slowly or fidgety/restless - - - - - 1 1  Suicidal thoughts - - - - - 0 0  PHQ-9 Score - - - - - 11 11  Difficult doing work/chores - - - - - Somewhat difficult Somewhat difficult       Review of Systems  Constitutional: Negative.   HENT: Negative.    Eyes: Negative.   Respiratory: Negative.    Cardiovascular: Negative.   Gastrointestinal: Negative.   Endocrine: Negative.   Genitourinary: Negative.   Musculoskeletal:        Ain in left arm  Skin: Negative.    Allergic/Immunologic: Negative.   Neurological: Negative.   Hematological: Negative.   Psychiatric/Behavioral: Negative.        Objective:   Not performed as patient was seen via phone.       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 Continue to monitor. 03/08/2021 2. Chronic Low Back Pain without Sciatica: No complaints today. Continue HEP as Tolerated. Continue to monitor. 03/08/2021 3. Sacral Wound: Continue to monitor. 03/08/2021 4. 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 Discussed that his UDS is positive for benzodiazepines that are not prescribed. This is his second positive UDS (last for suboxone), so discussed with him that as per our clinic policy we can no longer prescribe controlled substances for him anymore. Asked how many oxycodone he has left and he said 10-12 pills. Advised he taper medication to 4 pills today, 3 tomorrow, 2 the next day, and 1 thereafter to help avoid withdrawal. Discussed discontinuing future appointments with Riley Lam- will keep follow-up with myself. Requested that he call me if experiencing a lot of pain with and after this wean and we can discuss other options for his pain.   5 minutes spent in discussing with patient his positive UDS results, how to wean of his current oxycodone to avoid withdrawal, to please contact us if he has increasing pain during wean so we can discuss alternative treatments.

## 2021-03-19 ENCOUNTER — Other Ambulatory Visit: Payer: Self-pay

## 2021-03-19 MED ORDER — JOBST 20-30MMHG COMPRESSION SM MISC: 1.0000 | Freq: Every day | 0 refills | Status: AC

## 2021-03-25 ENCOUNTER — Ambulatory Visit: Payer: BC Managed Care – PPO | Admitting: Occupational Therapy

## 2021-03-25 DIAGNOSIS — M6281 Muscle weakness (generalized): Secondary | ICD-10-CM

## 2021-03-25 DIAGNOSIS — M25632 Stiffness of left wrist, not elsewhere classified: Secondary | ICD-10-CM

## 2021-03-25 DIAGNOSIS — M25622 Stiffness of left elbow, not elsewhere classified: Secondary | ICD-10-CM

## 2021-03-25 DIAGNOSIS — M25642 Stiffness of left hand, not elsewhere classified: Secondary | ICD-10-CM

## 2021-03-25 DIAGNOSIS — R600 Localized edema: Secondary | ICD-10-CM

## 2021-03-25 NOTE — Therapy (Signed)
[facility] [address] [place], [address] Phone: [phone]   Fax:  [phone]  Occupational Therapy Treatment  Patient Details  Name: Collin Brown MRN: [id number] Date of Birth: Apr 10, 1986 Referring Provider (OT): Dr Vladimir Creeks,   Encounter Date: 03/25/2021   OT End of Session - 03/25/21 1349     Visit Number 20    Number of Visits 28    Date for OT Re-Evaluation 05/20/21    OT Start Time 1350    OT Stop Time 1434    OT Time Calculation (min) 44 min    Activity Tolerance Patient tolerated treatment well    Behavior During Therapy Collin Brown for tasks assessed/performed             Past Medical History:  Diagnosis Date   Acute blood loss anemia    Critical illness myopathy    Diabetes mellitus without complication (HCC)    Dyspnea 08/23/2020   Hearing loss in right ear    Hypertension    Pneumonia due to COVID-19 virus 05/04/2020   Sleep apnea     Past Surgical History:  Procedure Laterality Date   TONSILLECTOMY     WOUND DEBRIDEMENT N/A 06/29/2020   Procedure: DEBRIDEMENT SACRAL WOUND;  Surgeon: Axel Filler, MD;  Location: [facility] OR;  Service: General;  Laterality: N/A;    There were no vitals filed for this visit.   Subjective Assessment - 03/25/21 1349     Subjective  I seen Dr Franklyn Lor this morning - wants me to do nerve conduction test in about month or so -and then won't do surgery until later in the year - wants my hand stronger in range I have and nerve see about it    Pertinent History Collin Brown is a 35 y.o. male who unfortunately contracted COVID in September 2021. He was ventilated for prolonged period of time and went in recovery notice he had bilateral arm weakness. His right upper extremity has since resolved with exception of some residual numbness and tingling in his fourth and fifth digit however he has noticed little improvement to his left upper extremity. He has  had increased shoulder and elbow movement over the last couple of months, but still overall little improvement compared to original baseline. He has been undergoing physical and occupational therapy and his pain has decreased over time. Detailed physical examination is above. However patient does have dependent edema in his left hand as well as a significant left wrist and fingers contracture. He is tender to palpation the proximal aspect of his left arm and does have anhidrosis noted with significant amount of dry skin on his left forearm. 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 C7-T1 is working, but very difficult to assess due to severe contractures and frozen joints. Would like the patient to undergo evaluation from orthopedic hand surgeon. At this point his bicep strength would be very hard to beat with a nerve transfer for elbow flexion, his main limitation in this regard is an elbow extension contracture. However possible consideration for radial to axillary nerve transfer and spinal accessory to suprascapular nerve transfer for increased movement of his shoulder depending on his progress. Potential options were discussed with the patient and his mother. We would like patient to see occupational therapist who specializes in brachial plexus injuries. We would like to see  him back in clinic in approximately 2 months for reevaluation and progress. He was encouraged to reach out to [place] in the meantime for any questions or concerns.    Patient Stated Goals Want to be able to use my L hand to bath, lift or grip , play games, put my belt on , pick up boxes and do my job    Currently in Pain? No/denies                [facility] OT Assessment - 03/25/21 0001       AROM   Left Wrist Extension 35 Degrees    Left Wrist Flexion 75 Degrees    Left Wrist Radial Deviation 24 Degrees    Left Wrist Ulnar Deviation  15 Degrees      Strength   Right Hand Grip (lbs) 71    Right Hand Lateral Pinch 27 lbs    Right Hand 3 Point Pinch 20 lbs    Left Hand Grip (lbs) 15    Left Hand Lateral Pinch 13 lbs    Left Hand 3 Point Pinch 6 lbs   10 with brace     Left Hand AROM   L Thumb MCP 0-60 50 Degrees    L Thumb IP 0-80 80 Degrees    L Thumb Radial ADduction/ABduction 0-55 42    L Thumb Palmar ADduction/ABduction 0-45 60    L Thumb Opposition to Index --   Opposition to 2nd and side of 3rd   L Index  MCP 0-90 55 Degrees   PROM 80   L Index PIP 0-100 75 Degrees   PROM 85   L Long  MCP 0-90 55 Degrees   PROM70   L Long PIP 0-100 60 Degrees   PROM 80   L Ring  MCP 0-90 40 Degrees   PROM 55   L Ring PIP 0-100 45 Degrees   PROM 55   L Little  MCP 0-90 22 Degrees   PROM 30   L Little PIP 0-100 65 Degrees   PROM 75                     OT Treatments/Exercises (OP) - 03/25/21 0001       Moist Heat Therapy   Number Minutes Moist Heat 8 Minutes    Moist Heat Location --   L hand and wrist prior to ROM             Last time assess circumference of L UE - pt using isotoner glove and Tubigrip D on hand to elbow- could not tolerate on upper arm - rolling before larger tubigrip - this date reassess and pt tender over Triceps area and increase circumference in upper arm - see flow sheet  Recommend at that time compression sleeve and glove- 20-30 mmHg to wear daytime- talked to [employer] -Pt was fitted with compression sleeve -but report could not get it on all the way- ed pt on donning and using use slippy gator - will ed on donning compression net visit        PROM and prolonged flexion stretch for MC and PIP of L hand after heat      Done soft tissue mobs and joint mobs to L hand/digits and wrist - done some carpal rolls Prior to ROM    cont to use at home flexion glove and knuckle bender - 2 x each separate and then combination 2 min each- place and hold -  10 reps    Afterwards  assist - place and hold hand gripper 10 reps at 10 lbs - OT max A to keep wrist neutral -place and hold digits - unable to maintain flexion of 5th- decrease sensation per pt  Measurements taken for recert - decrease to 1 x wk while awaiting nerve conduction and possible surgery to L hand         OT Education - 03/25/21 1349     Education Details HEP changes and progress    Person(s) Educated Patient    Methods Explanation;Demonstration;Tactile cues;Verbal cues;Handout    Comprehension Verbal cues required;Returned demonstration;Verbalized understanding              OT Short Term Goals - 03/25/21 1857       OT SHORT TERM GOAL #1   Title Pt and mom to be independent in HEP to decrease edema and increase AROM in forearm to digits    Baseline edema decrease except upper arm , AROM in wrist improved , sup, digits flexion more in 2nd and 3rd , thumb than 4th and 5th    Status Achieved               OT Long Term Goals - 03/25/21 1858       OT LONG TERM GOAL #1   Title L wrist AROM increase by 15-20 degrees for sup, extention, flexion , UD to carry or hold objects on palm and push up from chair    Baseline wrist -25 ext, flexion 52, UD 5, sup 45- NOW wrist ext 35, flexion 78, sup 65 - using it more -push up, carry shopping bag - flexion of elbow with sup partially    Status Achieved      OT LONG TERM GOAL #2   Title L digits flexion increase by more than 20 degrees in MC and PIP to hold 2 inch cylinder objects in ADL's and at work    Baseline MC flexion 25-30 degrees, PIP's 30-45 degrees - no grip  NOW - MC's flexion 22/ to 55 and PIP's 45 to 75 degrees -thumb increase - but cannot grip 2inch object or weight    Time 8    Period Weeks    Status On-going    Target Date 05/20/21      OT LONG TERM GOAL #3   Title L digits AROM increase for pt to touch palm to use in 25% of ADL's    Baseline see goal 2 for base line - no grip -and no using hand in any ADL's   NOW -Capsule  tightness - possible surgery later this year - not able to touch palm - greatly impaired still -grip 15 lbs    Time 8    Period Weeks    Status On-going    Target Date 05/20/21      OT LONG TERM GOAL #4   Title update HEP for strengthening and proximal ROM as progress    Baseline await possible surgery for shoulder - adress grip some with focus on ROM    Status Achieved                   Plan - 03/25/21 1856     Clinical Impression Statement Pt seen since 12/31/20 for L UE brachial plexus injury after recovering from COVID - pt awaiting possible surgry for shoulder - and then also capsulectomy of L hand MC's - nerve conduction in the next month or 2 repeat -  numbness still in 4th and 5th - tinel in forearm - did see Dr Franklyn Loruch today. Pt showed from start of care great progress in elbow , wrist AROM , supination -as well as able to use arm to carry back , push up - but still limited. L Digits flexion in PIP's increase more than MC's and 2nd/3rd digit more than 4th and 5th - Grip 15 lbs , lat grip using mostly - and able to do opposition to 2nd and side of 3rd- pt report increase functional use -but still severly limited in AROM in L hand , and shoulder more than elbow and wrist - pt can benefit from cont OT services - decrease to 1 x wk    OT Occupational Profile and History Problem Focused Assessment - Including review of records relating to presenting problem    Occupational performance deficits (Please refer to evaluation for details): ADL's;IADL's;Work;Play;Leisure;Social Participation    Body Structure / Function / Physical Skills ADL;Coordination;Flexibility;Edema;Dexterity;IADL;UE functional use;Strength;Decreased knowledge of use of DME;FMC;Pain    Rehab Potential Fair    Clinical Decision Making Several treatment options, min-mod task modification necessary    Comorbidities Affecting Occupational Performance: May have comorbidities impacting occupational performance    Modification  or Assistance to Complete Evaluation  Min-Moderate modification of tasks or assist with assess necessary to complete eval    OT Frequency 1x / week    OT Duration 8 weeks    OT Treatment/Interventions Self-care/ADL training;Paraffin;Moist Heat;Fluidtherapy;Contrast Bath;Therapeutic exercise;Manual Therapy;Patient/family education;Passive range of motion;Splinting;DME and/or AE instruction    Consulted and Agree with Plan of Care Patient             Patient will benefit from skilled therapeutic intervention in order to improve the following deficits and impairments:   Body Structure / Function / Physical Skills: ADL, Coordination, Flexibility, Edema, Dexterity, IADL, UE functional use, Strength, Decreased knowledge of use of DME, FMC, Pain       Visit Diagnosis: Stiffness of left elbow, not elsewhere classified - Plan: Ot plan of care cert/re-cert  Localized edema - Plan: Ot plan of care cert/re-cert  Muscle weakness (generalized) - Plan: Ot plan of care cert/re-cert  Stiffness of left wrist, not elsewhere classified - Plan: Ot plan of care cert/re-cert  Stiffness of left hand, not elsewhere classified - Plan: Ot plan of care cert/re-cert    Problem List Patient Active Problem List   Diagnosis Date Noted   Lymphedema of arm 12/11/2020   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   Sleeps in sitting position due to orthopnea 10/02/2020   Obesity with alveolar hypoventilation and body mass index (BMI) of 40 or greater (HCC) 10/02/2020   Chronic intermittent hypoxia with obstructive sleep apnea 10/02/2020   Pressure ulcer of sacral region, stage 4 (HCC) 09/05/2020   Diabetes mellitus (HCC) 09/05/2020   Dyspnea 08/23/2020   Drug induced constipation    Hypokalemia    COVID-19 long hauler manifesting chronic decreased mobility and endurance 06/17/2020   Debility 06/08/2020   Critical illness myopathy    Supplemental oxygen dependent    Essential hypertension    Brachial  plexopathy    Neuropathic pain    Sleep apnea     Collin Brown, Collin Brown OTR/L,CLT 03/25/2021, 7:15 PM  [facility] [address] [place], [place], [address] Phone: [phone]   Fax:  [phone]  Name: Collin Brown MRN: [id number] Date of Birth: 1985/10/01

## 2021-03-28 ENCOUNTER — Encounter: Payer: Self-pay | Admitting: Occupational Therapy

## 2021-03-28 ENCOUNTER — Ambulatory Visit: Payer: BC Managed Care – PPO | Admitting: Occupational Therapy

## 2021-03-28 DIAGNOSIS — M25612 Stiffness of left shoulder, not elsewhere classified: Secondary | ICD-10-CM

## 2021-03-28 DIAGNOSIS — M25642 Stiffness of left hand, not elsewhere classified: Secondary | ICD-10-CM

## 2021-03-28 DIAGNOSIS — M25622 Stiffness of left elbow, not elsewhere classified: Secondary | ICD-10-CM | POA: Diagnosis not present

## 2021-03-28 DIAGNOSIS — M6281 Muscle weakness (generalized): Secondary | ICD-10-CM

## 2021-03-28 DIAGNOSIS — R600 Localized edema: Secondary | ICD-10-CM

## 2021-03-28 DIAGNOSIS — M25632 Stiffness of left wrist, not elsewhere classified: Secondary | ICD-10-CM

## 2021-03-28 NOTE — Therapy (Signed)
[facility] [address] [place], [place], [address] Phone: [phone]   Fax:  [phone]  Occupational Therapy Treatment  Patient Details  Name: Collin Brown MRN: [id number] Date of Birth: 12/10/1985 Referring Provider (OT): Dr Vladimir CreeksBacigalopo,   Encounter Date: 03/28/2021   OT End of Session - 03/31/21 2058     Visit Number 21    Number of Visits 28    Date for OT Re-Evaluation 05/20/21    OT Start Time 1030    OT Stop Time 1115    OT Time Calculation (min) 45 min    Activity Tolerance Patient tolerated treatment well    Behavior During Therapy [facility] for tasks assessed/performed             Past Medical History:  Diagnosis Date   Acute blood loss anemia    Critical illness myopathy    Diabetes mellitus without complication (HCC)    Dyspnea 08/23/2020   Hearing loss in right ear    Hypertension    Pneumonia due to COVID-19 virus 05/04/2020   Sleep apnea     Past Surgical History:  Procedure Laterality Date   TONSILLECTOMY     WOUND DEBRIDEMENT N/A 06/29/2020   Procedure: DEBRIDEMENT SACRAL WOUND;  Surgeon: Axel Filleramirez, Armando, MD;  [facility];  Service: General;  Laterality: N/A;    There were no vitals filed for this visit.   Subjective Assessment - 03/31/21 2057     Subjective  Pt reports pain has decreased over time and especially after stopping some of the medications.  He is doing exercises one time a day on the days he works and 2-3 times a day on the other days.  Works 5 days a week.    Pertinent History Collin Brown is a 35 y.o. male who unfortunately contracted COVID in September 2021. He was ventilated for prolonged period of time and went in recovery notice he had bilateral arm weakness. His right upper extremity has since resolved with exception of some residual numbness and tingling in his fourth and fifth digit however he has noticed little improvement to his left upper  extremity. He has had increased shoulder and elbow movement over the last couple of months, but still overall little improvement compared to original baseline. He has been undergoing physical and occupational therapy and his pain has decreased over time. Detailed physical examination is above. However patient does have dependent edema in his left hand as well as a significant left wrist and fingers contracture. He is tender to palpation the proximal aspect of his left arm and does have anhidrosis noted with significant amount of dry skin on his left forearm. 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 C7-T1 is working, but very difficult to assess due to severe contractures and frozen joints. Would like the patient to undergo evaluation from orthopedic hand surgeon. At this point his bicep strength would be very hard to beat with a nerve transfer for elbow flexion, his main limitation in this regard is an elbow extension contracture. However possible consideration for radial to axillary nerve transfer and spinal accessory to suprascapular nerve transfer for increased movement of his shoulder depending on his progress. Potential options were discussed with the patient and his mother. We would like patient to see occupational therapist who specializes in brachial plexus injuries. We would like to  see him back in clinic in approximately 2 months for reevaluation and progress. He was encouraged to reach out to [place] in the meantime for any questions or concerns.    Patient Stated Goals Want to be able to use my L hand to bath, lift or grip , play games, put my belt on , pick up boxes and do my job    Currently in Pain? No/denies    Pain Score 0-No pain               [facility] OT Assessment - 03/31/21 2057       AROM   Left Wrist Extension 35 Degrees    Left Wrist Flexion 75 Degrees    Left Wrist Radial Deviation  24 Degrees    Left Wrist Ulnar Deviation 15 Degrees      Strength   Right Hand Grip (lbs) 71    Left Hand Grip (lbs) 15            Pt reports he is able to use the left arm now to help carry light items, stretches tend to help.  Able to open drinks, get glass of water, prepare light meals.   Compression glove and sleeve, wears about 2-4 hours at a time before having difficulty.  Reports some aggravation at the top of the sleeve and at the bottom when it wrinkles at the wrist.  Reassessed compression sleeve this date, tightness noted at wrist and at the top of the sleeve, despite the silicone dots on the top band, the sleeve tends to roll based on the size and shape of pt's upper arm and he may be a candidate for a custom arm sleeve rather than the current off the shelf.  Difficulty for pt to don sleeve and required assist even with use of slippery insert able to doff. Issued additional tubigrip to use with compression glove  Moist heat to elbow and hand for 5 mins prior to therapeutic exercises to prepare arm for exercises, increase tissue mobility.  Manual: soft tissue mobs and joint mobs to L hand/digits and wrist, performed carpal rolls  PROM and prolonged flexion stretch for MC and PIP of L hand performed after heat    HEP: home flexion glove and knuckle bender - 2 x each separate and then combination 2 min each- place and hold -10 reps    Place and hold hand gripper 10 reps at 10 lbs - OT max A to keep wrist neutral -place and hold digits - unable to maintain flexion of 5th- decrease sensation per pt   Response to tx: Pt continuing with therapy for strength and motion while waiting for surgery in the future.  Continues to demonstrate numbness in 4th and 5th digits, difficulty with placing small finger on hand gripper to engage in tasks due to decreased sensation.  Reassessed compression sleeve which was over the counter fit.  Pt with increased tightness at wrist and upper arm at top  band, band tends to roll based on pts size and shape of upper arm.  He may benefit from a custom fit sleeve rather than current one.  Pt continues to benefit from skilled OT services to maximize safety and independence in necessary daily tasks at home, work and in the community.                    OT Short Term Goals - 03/25/21 1857       OT SHORT TERM GOAL #1  Title Pt and mom to be independent in HEP to decrease edema and increase AROM in forearm to digits    Baseline edema decrease except upper arm , AROM in wrist improved , sup, digits flexion more in 2nd and 3rd , thumb than 4th and 5th    Status Achieved               OT Long Term Goals - 03/25/21 1858       OT LONG TERM GOAL #1   Title L wrist AROM increase by 15-20 degrees for sup, extention, flexion , UD to carry or hold objects on palm and push up from chair    Baseline wrist -25 ext, flexion 52, UD 5, sup 45- NOW wrist ext 35, flexion 78, sup 65 - using it more -push up, carry shopping bag - flexion of elbow with sup partially    Status Achieved      OT LONG TERM GOAL #2   Title L digits flexion increase by more than 20 degrees in MC and PIP to hold 2 inch cylinder objects in ADL's and at work    Baseline MC flexion 25-30 degrees, PIP's 30-45 degrees - no grip  NOW - MC's flexion 22/ to 55 and PIP's 45 to 75 degrees -thumb increase - but cannot grip 2inch object or weight    Time 8    Period Weeks    Status On-going    Target Date 05/20/21      OT LONG TERM GOAL #3   Title L digits AROM increase for pt to touch palm to use in 25% of ADL's    Baseline see goal 2 for base line - no grip -and no using hand in any ADL's   NOW -Capsule tightness - possible surgery later this year - not able to touch palm - greatly impaired still -grip 15 lbs    Time 8    Period Weeks    Status On-going    Target Date 05/20/21      OT LONG TERM GOAL #4   Title update HEP for strengthening and proximal ROM as progress     Baseline await possible surgery for shoulder - adress grip some with focus on ROM    Status Achieved                   Plan - 03/31/21 2058     Clinical Impression Statement Pt seen since 12/31/20 for L UE brachial plexus injury after recovering from COVID - pt awaiting possible surgery for shoulder - and then also capsulectomy of L hand MC's - nerve conduction in the next month or 2 repeat - numbness still in 4th and 5th - tinel in forearm -Pt continuing with therapy for strength and motion while waiting for surgery in the future.  Continues to demonstrate numbness in 4th and 5th digits, difficulty with placing small finger on hand gripper to engage in tasks due to decreased sensation.  Reassessed compression sleeve which was over the counter fit.  Pt with increased tightness at wrist and upper arm at top band, band tends to roll based on pts size and shape of upper arm.  He may benefit from a custom fit sleeve rather than current one.  Pt continues to benefit from skilled OT services to maximize safety and independence in necessary daily tasks at home, work and in the community.    OT Occupational Profile and History Problem Focused Assessment - Including review of records  relating to presenting problem    Occupational performance deficits (Please refer to evaluation for details): ADL's;IADL's;Work;Play;Leisure;Social Participation    Body Structure / Function / Physical Skills ADL;Coordination;Flexibility;Edema;Dexterity;IADL;UE functional use;Strength;Decreased knowledge of use of DME;FMC;Pain    Rehab Potential Fair    Clinical Decision Making Several treatment options, min-mod task modification necessary    Comorbidities Affecting Occupational Performance: May have comorbidities impacting occupational performance    Modification or Assistance to Complete Evaluation  Min-Moderate modification of tasks or assist with assess necessary to complete eval    OT Frequency 1x / week    OT  Duration 8 weeks    OT Treatment/Interventions Self-care/ADL training;Paraffin;Moist Heat;Fluidtherapy;Contrast Bath;Therapeutic exercise;Manual Therapy;Patient/family education;Passive range of motion;Splinting;DME and/or AE instruction    Consulted and Agree with Plan of Care Patient             Patient will benefit from skilled therapeutic intervention in order to improve the following deficits and impairments:   Body Structure / Function / Physical Skills: ADL, Coordination, Flexibility, Edema, Dexterity, IADL, UE functional use, Strength, Decreased knowledge of use of DME, FMC, Pain       Visit Diagnosis: Stiffness of left elbow, not elsewhere classified  Localized edema  Muscle weakness (generalized)  Stiffness of left wrist, not elsewhere classified  Stiffness of left hand, not elsewhere classified  Stiffness of left shoulder, not elsewhere classified    Problem List Patient Active Problem List   Diagnosis Date Noted   Lymphedema of arm 12/11/2020   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   Sleeps in sitting position due to orthopnea 10/02/2020   Obesity with alveolar hypoventilation and body mass index (BMI) of 40 or greater (HCC) 10/02/2020   Chronic intermittent hypoxia with obstructive sleep apnea 10/02/2020   Pressure ulcer of sacral region, stage 4 (HCC) 09/05/2020   Diabetes mellitus (HCC) 09/05/2020   Dyspnea 08/23/2020   Drug induced constipation    Hypokalemia    COVID-19 long hauler manifesting chronic decreased mobility and endurance 06/17/2020   Debility 06/08/2020   Critical illness myopathy    Supplemental oxygen dependent    Essential hypertension    Brachial plexopathy    Neuropathic pain    Sleep apnea    Collin Brown, OTR/L, CLT  Collin Brown 03/31/2021, 9:13 PM   [facility] [address] [place], [address] Phone: [phone]   Fax:  [phone]  Name: Collin Brown MRN: [id number] Date of Birth: [birth year]

## 2021-04-01 ENCOUNTER — Ambulatory Visit: Payer: BC Managed Care – PPO | Admitting: Occupational Therapy

## 2021-04-01 ENCOUNTER — Encounter: Payer: Self-pay | Admitting: *Deleted

## 2021-04-01 DIAGNOSIS — M25642 Stiffness of left hand, not elsewhere classified: Secondary | ICD-10-CM

## 2021-04-01 DIAGNOSIS — R600 Localized edema: Secondary | ICD-10-CM

## 2021-04-01 DIAGNOSIS — M25622 Stiffness of left elbow, not elsewhere classified: Secondary | ICD-10-CM

## 2021-04-01 DIAGNOSIS — M25632 Stiffness of left wrist, not elsewhere classified: Secondary | ICD-10-CM

## 2021-04-01 DIAGNOSIS — M6281 Muscle weakness (generalized): Secondary | ICD-10-CM

## 2021-04-01 NOTE — Therapy (Signed)
[place] [facility] [address] [place], [address] Phone: [phone]   Fax:  [phone]  Occupational Therapy Treatment  Patient Details  Name: Collin Brown MRN: [id number] Date of Birth: [birth year] Referring Provider (OT): Dr Vladimir Creeks,   Encounter Date: 04/01/2021   OT End of Session - 04/01/21 1105     Visit Number 22    Number of Visits 28    Date for OT Re-Evaluation 05/20/21    OT Start Time 1035    OT Stop Time 1121    OT Time Calculation (min) 46 min    Activity Tolerance Patient tolerated treatment well    Behavior During Therapy [facility] for tasks assessed/performed             Past Medical History:  Diagnosis Date   Acute blood loss anemia    Critical illness myopathy    Diabetes mellitus without complication (HCC)    Dyspnea 08/23/2020   Hearing loss in right ear    Hypertension    Pneumonia due to COVID-19 virus 05/04/2020   Sleep apnea     Past Surgical History:  Procedure Laterality Date   TONSILLECTOMY     WOUND DEBRIDEMENT N/A 06/29/2020   Procedure: DEBRIDEMENT SACRAL WOUND;  Surgeon: Axel Filler, MD;  [facility];  Service: General;  Laterality: N/A;    There were no vitals filed for this visit.   Subjective Assessment - 04/01/21 1042     Subjective  They schedule my nerve conduction is middle Oct- but my wrist motion better than it use to  -and the sleeve not fitting - to tight at the wrist and hard to get on- afterwhile bother my wrist- fingers still tight and stiff- numbness in 4th and 5th digit continues    Pertinent History Collin Brown is a 35 y.o. male who unfortunately contracted COVID in September 2021. He was ventilated for prolonged period of time and went in recovery notice he had bilateral arm weakness. His right upper extremity has since resolved with exception of some residual numbness and tingling in his fourth and fifth digit however he has  noticed little improvement to his left upper extremity. He has had increased shoulder and elbow movement over the last couple of months, but still overall little improvement compared to original baseline. He has been undergoing physical and occupational therapy and his pain has decreased over time. Detailed physical examination is above. However patient does have dependent edema in his left hand as well as a significant left wrist and fingers contracture. He is tender to palpation the proximal aspect of his left arm and does have anhidrosis noted with significant amount of dry skin on his left forearm. 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 C7-T1 is working, but very difficult to assess due to severe contractures and frozen joints. Would like the patient to undergo evaluation from orthopedic hand surgeon. At this point his bicep strength would be very hard to beat with a nerve transfer for elbow flexion, his main limitation in this regard is an elbow extension contracture. However possible consideration for radial to axillary nerve transfer and spinal accessory to suprascapular nerve transfer for increased movement of his shoulder depending on his progress. Potential options were discussed with the patient and his mother. We would like patient to see occupational therapist who specializes in brachial  plexus injuries. We would like to see him back in clinic in approximately 2 months for reevaluation and progress. He was encouraged to reach out to [place] in the meantime for any questions or concerns.    Patient Stated Goals Want to be able to use my L hand to bath, lift or grip , play games, put my belt on , pick up boxes and do my job    Currently in Pain? No/denies    Pain Score 0-No pain                 LYMPHEDEMA/ONCOLOGY QUESTIONNAIRE - 04/01/21 0001       Right Upper Extremity Lymphedema   15 cm  Proximal to Olecranon Process 57 cm    10 cm Proximal to Olecranon Process 48.5 cm    Olecranon Process 38 cm    15 cm Proximal to Ulnar Styloid Process 37 cm    Just Proximal to Ulnar Styloid Process 22 cm      Left Upper Extremity Lymphedema   15 cm Proximal to Olecranon Process 51 cm    10 cm Proximal to Olecranon Process 49.5 cm    Olecranon Process 36.6 cm    15 cm Proximal to Ulnar Styloid Process 36.4 cm    Just Proximal to Ulnar Styloid Process 23.7 cm             Pt compression garments - [employer] assess- to tight around the wrist and upper arm - hard time donning - and pt report to tight and rolling at top band  When taking his measurements circumference this date - L smaller than R except hand and wrist - pt to wear harmony glove and then tubigrip on hand to elbow during work day  Will contact Rep about different sleeve option         OT Treatments/Exercises (OP) - 04/01/21 0001       Moist Heat Therapy   Number Minutes Moist Heat 8 Minutes    Moist Heat Location --   hand and wrist            PROM and prolonged flexion stretch ffor elbow this date by OT and on table -  5 x 30 sec  Improve to 115 PROM and AROM 110  Pt to cont at home with  Also prolonged flexion stretch by OT to  [facility] and PIP of L hand after heat      Done soft tissue mobs and joint mobs to L hand/digits and wrist Prior to ROM  Pain with joint mobs to 4th and 5th MC's    cont to use at home flexion glove and knuckle bender - 2 x each separate and then combination 2 min each- place and hold -10 reps    Afterwards assist - place and hold hand gripper 10 reps at 10 lbs - OT max A to keep wrist neutral -place and hold digits - unable to maintain flexion of 5th- decrease sensation per pt  PROM for wrist extention and supination         OT Education - 04/01/21 1105     Education Details HEP changes and progress    Person(s) Educated Patient    Methods  Explanation;Demonstration;Tactile cues;Verbal cues;Handout    Comprehension Verbal cues required;Returned demonstration;Verbalized understanding              OT Short Term Goals - 03/25/21 1857       OT SHORT TERM GOAL #1  Title Pt and mom to be independent in HEP to decrease edema and increase AROM in forearm to digits    Baseline edema decrease except upper arm , AROM in wrist improved , sup, digits flexion more in 2nd and 3rd , thumb than 4th and 5th    Status Achieved               OT Long Term Goals - 03/25/21 1858       OT LONG TERM GOAL #1   Title L wrist AROM increase by 15-20 degrees for sup, extention, flexion , UD to carry or hold objects on palm and push up from chair    Baseline wrist -25 ext, flexion 52, UD 5, sup 45- NOW wrist ext 35, flexion 78, sup 65 - using it more -push up, carry shopping bag - flexion of elbow with sup partially    Status Achieved      OT LONG TERM GOAL #2   Title L digits flexion increase by more than 20 degrees in MC and PIP to hold 2 inch cylinder objects in ADL's and at work    Baseline MC flexion 25-30 degrees, PIP's 30-45 degrees - no grip  NOW - MC's flexion 22/ to 55 and PIP's 45 to 75 degrees -thumb increase - but cannot grip 2inch object or weight    Time 8    Period Weeks    Status On-going    Target Date 05/20/21      OT LONG TERM GOAL #3   Title L digits AROM increase for pt to touch palm to use in 25% of ADL's    Baseline see goal 2 for base line - no grip -and no using hand in any ADL's   NOW -Capsule tightness - possible surgery later this year - not able to touch palm - greatly impaired still -grip 15 lbs    Time 8    Period Weeks    Status On-going    Target Date 05/20/21      OT LONG TERM GOAL #4   Title update HEP for strengthening and proximal ROM as progress    Baseline await possible surgery for shoulder - adress grip some with focus on ROM    Status Achieved                   Plan - 04/01/21  1106     Clinical Impression Statement Pt seen since 12/31/20 for L UE brachial plexus injury after recovering from COVID - pt awaiting possible surgery for shoulder - and then also capsulectomy of L hand MC's - nerve conduction in the next month or 2 repeat - numbness still in 4th and 5th - tinel in forearm -Pt continuing with therapy for strength and motion while waiting for surgery in the future.  Continues to demonstrate numbness in 4th and 5th digits, difficulty with placing small finger on hand gripper to engage in tasks due to decreased sensation.  Reassessed compression sleeve which was over the counter fit.  Pt report increase discomfort and tightness at the wrist and upper arm at top band. Remeasured this date his circumfered of arm and compare to R - decrease since last taken - pt to hold off on wearing compression sleeve -but only wear glove [employer] - and then can do tubigrip on hand to elbow days at work - and will remeasure next visit - focus this date on elbow flexion - PROM 115 and AROM 110. COnt  focus on wrist extention -and digits MC flexion of 4th and 5th.  Pt continues to benefit from skilled OT services to maximize safety and independence in necessary daily tasks at home, work and in the community.    OT Occupational Profile and History Problem Focused Assessment - Including review of records relating to presenting problem    Occupational performance deficits (Please refer to evaluation for details): ADL's;IADL's;Work;Play;Leisure;Social Participation    Body Structure / Function / Physical Skills ADL;Coordination;Flexibility;Edema;Dexterity;IADL;UE functional use;Strength;Decreased knowledge of use of DME;FMC;Pain    Rehab Potential Fair    Clinical Decision Making Several treatment options, min-mod task modification necessary    Comorbidities Affecting Occupational Performance: May have comorbidities impacting occupational performance    Modification or Assistance to Complete  Evaluation  Min-Moderate modification of tasks or assist with assess necessary to complete eval    OT Frequency 1x / week    OT Duration 8 weeks    OT Treatment/Interventions Self-care/ADL training;Paraffin;Moist Heat;Fluidtherapy;Contrast Bath;Therapeutic exercise;Manual Therapy;Patient/family education;Passive range of motion;Splinting;DME and/or AE instruction    Consulted and Agree with Plan of Care Patient             Patient will benefit from skilled therapeutic intervention in order to improve the following deficits and impairments:   Body Structure / Function / Physical Skills: ADL, Coordination, Flexibility, Edema, Dexterity, IADL, UE functional use, Strength, Decreased knowledge of use of DME, FMC, Pain       Visit Diagnosis: Localized edema  Muscle weakness (generalized)  Stiffness of left hand, not elsewhere classified  Stiffness of left wrist, not elsewhere classified  Stiffness of left elbow, not elsewhere classified    Problem List Patient Active Problem List   Diagnosis Date Noted   Lymphedema of arm 12/11/2020   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   Sleeps in sitting position due to orthopnea 10/02/2020   Obesity with alveolar hypoventilation and body mass index (BMI) of 40 or greater (HCC) 10/02/2020   Chronic intermittent hypoxia with obstructive sleep apnea 10/02/2020   Pressure ulcer of sacral region, stage 4 (HCC) 09/05/2020   Diabetes mellitus (HCC) 09/05/2020   Dyspnea 08/23/2020   Drug induced constipation    Hypokalemia    COVID-19 long hauler manifesting chronic decreased mobility and endurance 06/17/2020   Debility 06/08/2020   Critical illness myopathy    Supplemental oxygen dependent    Essential hypertension    Brachial plexopathy    Neuropathic pain    Sleep apnea     Oletta Cohn OTR/l,CLT 04/01/2021, 12:02 PM  [place] [facility] [address], [place],  [address] Phone: [phone]   Fax:  [phone]  Name: Collin Brown MRN: [id number] Date of Birth: 09/15/1985

## 2021-04-03 ENCOUNTER — Encounter: Payer: Self-pay | Admitting: Dietician

## 2021-04-03 NOTE — Progress Notes (Signed)
Have not heard back from patient to reschedule his missed appointment from 02/07/21. Sent notification to referring provider. 

## 2021-04-04 ENCOUNTER — Encounter: Payer: BC Managed Care – PPO | Admitting: Physical Medicine and Rehabilitation

## 2021-04-08 ENCOUNTER — Ambulatory Visit: Payer: BC Managed Care – PPO | Admitting: Occupational Therapy

## 2021-04-10 ENCOUNTER — Ambulatory Visit: Payer: BC Managed Care – PPO | Admitting: Occupational Therapy

## 2021-04-10 DIAGNOSIS — M25632 Stiffness of left wrist, not elsewhere classified: Secondary | ICD-10-CM

## 2021-04-10 DIAGNOSIS — M6281 Muscle weakness (generalized): Secondary | ICD-10-CM

## 2021-04-10 DIAGNOSIS — M25622 Stiffness of left elbow, not elsewhere classified: Secondary | ICD-10-CM

## 2021-04-10 DIAGNOSIS — R600 Localized edema: Secondary | ICD-10-CM

## 2021-04-10 NOTE — Therapy (Signed)
[place] [facility] [address], [place], [address] Phone: [phone]   Fax:  [phone]  Occupational Therapy Treatment  Patient Details  Name: Collin Brown MRN: [id number] Date of Birth: 07/19/1986 Referring Provider (OT): Dr Vladimir Creeks,   Encounter Date: 04/10/2021   OT End of Session - 04/10/21 1538     Visit Number 23    Number of Visits 28    Date for OT Re-Evaluation 05/20/21    OT Start Time 1445    OT Stop Time 1533    OT Time Calculation (min) 48 min    Activity Tolerance Patient tolerated treatment well    Behavior During Therapy [facility] for tasks assessed/performed             Past Medical History:  Diagnosis Date   Acute blood loss anemia    Critical illness myopathy    Diabetes mellitus without complication (HCC)    Dyspnea 08/23/2020   Hearing loss in right ear    Hypertension    Pneumonia due to COVID-19 virus 05/04/2020   Sleep apnea     Past Surgical History:  Procedure Laterality Date   TONSILLECTOMY     WOUND DEBRIDEMENT N/A 06/29/2020   Procedure: DEBRIDEMENT SACRAL WOUND;  Surgeon: Axel Filler, MD;  Location: [facility] OR;  Service: General;  Laterality: N/A;    There were no vitals filed for this visit.   Subjective Assessment - 04/10/21 1446     Subjective  Feel like I can carry or lift objects better in my arm - forearm little more ache since yesterday    Pertinent History Collin Brown is a 35 y.o. male who unfortunately contracted COVID in September 2021. He was ventilated for prolonged period of time and went in recovery notice he had bilateral arm weakness. His right upper extremity has since resolved with exception of some residual numbness and tingling in his fourth and fifth digit however he has noticed little improvement to his left upper extremity. He has had increased shoulder and elbow movement over the last couple of months, but still overall little  improvement compared to original baseline. He has been undergoing physical and occupational therapy and his pain has decreased over time. Detailed physical examination is above. However patient does have dependent edema in his left hand as well as a significant left wrist and fingers contracture. He is tender to palpation the proximal aspect of his left arm and does have anhidrosis noted with significant amount of dry skin on his left forearm. 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 C7-T1 is working, but very difficult to assess due to severe contractures and frozen joints. Would like the patient to undergo evaluation from orthopedic hand surgeon. At this point his bicep strength would be very hard to beat with a nerve transfer for elbow flexion, his main limitation in this regard is an elbow extension contracture. However possible consideration for radial to axillary nerve transfer and spinal accessory to suprascapular nerve transfer for increased movement of his shoulder depending on his progress. Potential options were discussed with the patient and his mother. We would like patient to see occupational therapist who specializes in brachial plexus injuries. We would like to see him back in clinic in approximately 2 months for reevaluation and progress. He was encouraged to reach out to [place] in the meantime for  any questions or concerns.    Patient Stated Goals Want to be able to use my L hand to bath, lift or grip , play games, put my belt on , pick up boxes and do my job    Currently in Pain? Yes    Pain Score 2     Pain Location --   forearm   Pain Orientation Left    Pain Descriptors / Indicators Aching    Pain Type Acute pain;Chronic pain                 LYMPHEDEMA/ONCOLOGY QUESTIONNAIRE - 04/10/21 0001       Left Upper Extremity Lymphedema   15 cm Proximal to Olecranon Process 51 cm     10 cm Proximal to Olecranon Process 49 cm    Olecranon Process 37 cm                     OT Treatments/Exercises (OP) - 04/10/21 0001       Moist Heat Therapy   Number Minutes Moist Heat 6 Minutes    Moist Heat Location --   L hand and foreram prior to soft tissue and ROM           When taking his measurements circumference this date - L smaller than R except hand and wrist - pt to wear harmony glove and then tubigrip on hand to elbow during work day  Will contact Rep about different sleeve option again        staying steady in circumference with out compression sleeve       soft tissue mobs on volar and dorsal wrist and forearm - tender with use of graston tool nr 2 and 4 - moslty over volar forearm - flexors - pt to roll volar forearm over red foam roller for soft tissue mobs -  Also prolonged flexion stretch by OT to  [facility] and PIP of L hand after heat  Reassess knuckle bender - switch out of rubber bands and pt to do manual pressure on ulnar side of hand   Slight  pull over MC of 4th and 5th wanted      Done soft tissue mobs and joint mobs to L hand/digits and wrist Prior to ROM  Pain with joint mobs to 4th and 5th MC's  and traction on MC and PIP's    cont to use at home flexion glove and knuckle bender -   Afterwards assist - place and hold hand gripper 10 reps at 10 lbs - OT max A to keep wrist neutral -place and hold digits - unable to maintain flexion of 5th  PROM done for sup, wrist ext and RD< UD  2 1/2 lbs for wrist extention  and RD  place and hold 10 reps Pronation and then AAROM sup 2 1/2 lbs weight 15 reps  2 sets   Tinel  for ulnar N this date progress to ulnar palm - was 2 -3 wks ago in forearm -               OT Education - 04/10/21 1538     Education Details HEP changes and progress    Person(s) Educated Patient    Methods Explanation;Demonstration;Tactile cues;Verbal cues;Handout    Comprehension Verbal cues required;Returned  demonstration;Verbalized understanding              OT Short Term Goals - 03/25/21 1857       OT SHORT TERM GOAL #1  Title Pt and mom to be independent in HEP to decrease edema and increase AROM in forearm to digits    Baseline edema decrease except upper arm , AROM in wrist improved , sup, digits flexion more in 2nd and 3rd , thumb than 4th and 5th    Status Achieved               OT Long Term Goals - 03/25/21 1858       OT LONG TERM GOAL #1   Title L wrist AROM increase by 15-20 degrees for sup, extention, flexion , UD to carry or hold objects on palm and push up from chair    Baseline wrist -25 ext, flexion 52, UD 5, sup 45- NOW wrist ext 35, flexion 78, sup 65 - using it more -push up, carry shopping bag - flexion of elbow with sup partially    Status Achieved      OT LONG TERM GOAL #2   Title L digits flexion increase by more than 20 degrees in MC and PIP to hold 2 inch cylinder objects in ADL's and at work    Baseline MC flexion 25-30 degrees, PIP's 30-45 degrees - no grip  NOW - MC's flexion 22/ to 55 and PIP's 45 to 75 degrees -thumb increase - but cannot grip 2inch object or weight    Time 8    Period Weeks    Status On-going    Target Date 05/20/21      OT LONG TERM GOAL #3   Title L digits AROM increase for pt to touch palm to use in 25% of ADL's    Baseline see goal 2 for base line - no grip -and no using hand in any ADL's   NOW -Capsule tightness - possible surgery later this year - not able to touch palm - greatly impaired still -grip 15 lbs    Time 8    Period Weeks    Status On-going    Target Date 05/20/21      OT LONG TERM GOAL #4   Title update HEP for strengthening and proximal ROM as progress    Baseline await possible surgery for shoulder - adress grip some with focus on ROM    Status Achieved                   Plan - 04/10/21 1539     Clinical Impression Statement Pt seen since 12/31/20 for L UE brachial plexus injury after  recovering from COVID - pt awaiting possible surgery for shoulder - and then also capsulectomy of L hand MC's - nerve conduction in end of OCt - numbness still in 4th and 5th - tinel progress this date to ulnar palm  -Pt continuing with therapy for strength and motion while waiting for surgery in the future.  Continues to demonstrate numbness in 4th and 5th digits, difficulty with place and hold small finger on hand gripper to engage in tasks due to decreased sensation.  Reassessed compression sleeve which was over the counter fit.  Pt report increase discomfort and tightness at the wrist and upper arm at top band. Remeasured  last time and his circumfered of arm and compare to R - decrease since last taken - pt to hold off on wearing compression sleeve -but only wear glove [employer] - and then can do tubigrip on hand to elbow days at work - focus this date wrist and digits PROM and strenghthening  COnt focus on  wrist extention -and digits MC flexion of 4th and 5th.  As well as grip strength Pt continues to benefit from skilled OT services to maximize safety and independence in necessary daily tasks at home, work and in the community.    OT Occupational Profile and History Problem Focused Assessment - Including review of records relating to presenting problem    Occupational performance deficits (Please refer to evaluation for details): ADL's;IADL's;Work;Play;Leisure;Social Participation    Body Structure / Function / Physical Skills ADL;Coordination;Flexibility;Edema;Dexterity;IADL;UE functional use;Strength;Decreased knowledge of use of DME;FMC;Pain    Rehab Potential Fair    Clinical Decision Making Several treatment options, min-mod task modification necessary    Comorbidities Affecting Occupational Performance: May have comorbidities impacting occupational performance    Modification or Assistance to Complete Evaluation  Min-Moderate modification of tasks or assist with assess necessary to complete  eval    OT Frequency 1x / week    OT Duration 6 weeks    OT Treatment/Interventions Self-care/ADL training;Paraffin;Moist Heat;Fluidtherapy;Contrast Bath;Therapeutic exercise;Manual Therapy;Patient/family education;Passive range of motion;Splinting;DME and/or AE instruction    Consulted and Agree with Plan of Care Patient             Patient will benefit from skilled therapeutic intervention in order to improve the following deficits and impairments:   Body Structure / Function / Physical Skills: ADL, Coordination, Flexibility, Edema, Dexterity, IADL, UE functional use, Strength, Decreased knowledge of use of DME, FMC, Pain       Visit Diagnosis: Muscle weakness (generalized)  Localized edema  Stiffness of left wrist, not elsewhere classified  Stiffness of left elbow, not elsewhere classified    Problem List Patient Active Problem List   Diagnosis Date Noted   Lymphedema of arm 12/11/2020   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   Sleeps in sitting position due to orthopnea 10/02/2020   Obesity with alveolar hypoventilation and body mass index (BMI) of 40 or greater (HCC) 10/02/2020   Chronic intermittent hypoxia with obstructive sleep apnea 10/02/2020   Pressure ulcer of sacral region, stage 4 (HCC) 09/05/2020   Diabetes mellitus (HCC) 09/05/2020   Dyspnea 08/23/2020   Drug induced constipation    Hypokalemia    COVID-19 long hauler manifesting chronic decreased mobility and endurance 06/17/2020   Debility 06/08/2020   Critical illness myopathy    Supplemental oxygen dependent    Essential hypertension    Brachial plexopathy    Neuropathic pain    Sleep apnea     Oletta Cohn OTR/L,CLT 04/10/2021, 3:49 PM   [facility] [address], [place], [address] Phone: [phone]   Fax:  [phone]  Name: Collin Brown MRN: [id number] Date of Birth: 15-Dec-1985

## 2021-04-18 ENCOUNTER — Ambulatory Visit: Payer: BC Managed Care – PPO | Attending: Family Medicine | Admitting: Occupational Therapy

## 2021-04-18 DIAGNOSIS — M25632 Stiffness of left wrist, not elsewhere classified: Secondary | ICD-10-CM | POA: Diagnosis present

## 2021-04-18 DIAGNOSIS — M25612 Stiffness of left shoulder, not elsewhere classified: Secondary | ICD-10-CM | POA: Diagnosis present

## 2021-04-18 DIAGNOSIS — R600 Localized edema: Secondary | ICD-10-CM | POA: Diagnosis present

## 2021-04-18 DIAGNOSIS — M25642 Stiffness of left hand, not elsewhere classified: Secondary | ICD-10-CM

## 2021-04-18 DIAGNOSIS — M25622 Stiffness of left elbow, not elsewhere classified: Secondary | ICD-10-CM | POA: Diagnosis present

## 2021-04-18 DIAGNOSIS — M6281 Muscle weakness (generalized): Secondary | ICD-10-CM | POA: Diagnosis present

## 2021-04-18 NOTE — Therapy (Signed)
[facility] [address] [place], [address] Phone: [phone]   Fax:  [phone]  Occupational Therapy Treatment  Patient Details  Name: Collin Brown MRN: [id number] Date of Birth: 01-23-1986 Referring Provider (OT): Dr Vladimir Creeks,   Encounter Date: 04/18/2021   OT End of Session - 04/18/21 1214     Visit Number 24    Number of Visits 28    Date for OT Re-Evaluation 05/20/21    OT Start Time 1115    OT Stop Time 1205    OT Time Calculation (min) 50 min    Activity Tolerance Patient tolerated treatment well    Behavior During Therapy [facility] for tasks assessed/performed             Past Medical History:  Diagnosis Date   Acute blood loss anemia    Critical illness myopathy    Diabetes mellitus without complication (HCC)    Dyspnea 08/23/2020   Hearing loss in right ear    Hypertension    Pneumonia due to COVID-19 virus 05/04/2020   Sleep apnea     Past Surgical History:  Procedure Laterality Date   TONSILLECTOMY     WOUND DEBRIDEMENT N/A 06/29/2020   Procedure: DEBRIDEMENT SACRAL WOUND;  Surgeon: Axel Filler, MD;  Location: [facility] OR;  Service: General;  Laterality: N/A;    There were no vitals filed for this visit.   Subjective Assessment - 04/18/21 1211     Subjective  I working on myself - I was little depressed before I got sick -and then I got sick - and then I think I was little depressed again since I get better- and cover it up with joking- but I feel better now and working on myself and eating healthier now too    Pertinent History [name] is a 35 y.o. male who unfortunately contracted COVID in September 2021. He was ventilated for prolonged period of time and went in recovery notice he had bilateral arm weakness. His right upper extremity has since resolved with exception of some residual numbness and tingling in his fourth and fifth digit however he has noticed little  improvement to his left upper extremity. He has had increased shoulder and elbow movement over the last couple of months, but still overall little improvement compared to original baseline. He has been undergoing physical and occupational therapy and his pain has decreased over time. Detailed physical examination is above. However patient does have dependent edema in his left hand as well as a significant left wrist and fingers contracture. He is tender to palpation the proximal aspect of his left arm and does have anhidrosis noted with significant amount of dry skin on his left forearm. 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 C7-T1 is working, but very difficult to assess due to severe contractures and frozen joints. Would like the patient to undergo evaluation from orthopedic hand surgeon. At this point his bicep strength would be very hard to beat with a nerve transfer for elbow flexion, his main limitation in this regard is an elbow extension contracture. However possible consideration for radial to axillary nerve transfer and spinal accessory to suprascapular nerve transfer for increased movement of his shoulder depending on his progress. Potential options were discussed with the patient and his mother. We would like patient to see occupational therapist who specializes  in brachial plexus injuries. We would like to see him back in clinic in approximately 2 months for reevaluation and progress. He was encouraged to reach out to [place] in the meantime for any questions or concerns.    Patient Stated Goals Want to be able to use my L hand to bath, lift or grip , play games, put my belt on , pick up boxes and do my job    Currently in Pain? No/denies                [facility] OT Assessment - 04/18/21 0001       AROM   Left Shoulder Extension 55 Degrees    Left Shoulder Flexion 85 Degrees    Left Shoulder  ABduction 48 Degrees    Left Elbow Flexion 105    Left Forearm Supination 70 Degrees    Left Wrist Extension 35 Degrees    Left Wrist Flexion 75 Degrees    Left Wrist Radial Deviation 25 Degrees    Left Wrist Ulnar Deviation 20 Degrees      Strength   Left Hand Grip (lbs) 20   20 with Benik   Left Hand Lateral Pinch 13.5 lbs    Left Hand 3 Point Pinch 7 lbs   11 brace     Left Hand AROM   L Thumb MCP 0-60 50 Degrees    L Thumb IP 0-80 85 Degrees    L Thumb Radial ADduction/ABduction 0-55 50    L Thumb Palmar ADduction/ABduction 0-45 60    L Index  MCP 0-90 45 Degrees    L Index PIP 0-100 85 Degrees    L Long  MCP 0-90 50 Degrees    L Long PIP 0-100 65 Degrees    L Ring  MCP 0-90 50 Degrees    L Little  MCP 0-90 22 Degrees    L Little PIP 0-100 65 Degrees             Taken some measurements for AROM shoulder to hand on L - progress in 2nd and 3rd PIP - and MC of 4th  Tinel ulnar n now base of palm - was few wks ago mid forearm Wrist strength increase  Supination increased  Shoulder flexion increase  Add to HEP AAROM for shoulder ext - using wand at home Add and review stabilization HEP to scapula - in supine - protraction and retraction - 10 reps  And alphabet - can do up to S- but fatigue  Pt to not compensate with upper traps - pain free  Can do every other day  Cont to do shoulder flexion  in supine       staying steady in circumference with out compression sleeve        Reassess knuckle bender - switch out of rubber bands and pt to do manual pressure on ulnar side of hand  Can use if possilble when playing xbox - to keep MC's in flexion while using PIP's more   Slight  pull over MC of 4th and 5th to focus on while nerve healing occur     Increase wrist strength in all planes                              OT Education - 04/18/21 1212     Education Details progress and HEP    Person(s) Educated Patient    Methods  Explanation;Demonstration;Tactile cues;Verbal cues;Handout  Comprehension Verbal cues required;Returned demonstration;Verbalized understanding              OT Short Term Goals - 03/25/21 1857       OT SHORT TERM GOAL #1   Title Pt and mom to be independent in HEP to decrease edema and increase AROM in forearm to digits    Baseline edema decrease except upper arm , AROM in wrist improved , sup, digits flexion more in 2nd and 3rd , thumb than 4th and 5th    Status Achieved               OT Long Term Goals - 03/25/21 1858       OT LONG TERM GOAL #1   Title L wrist AROM increase by 15-20 degrees for sup, extention, flexion , UD to carry or hold objects on palm and push up from chair    Baseline wrist -25 ext, flexion 52, UD 5, sup 45- NOW wrist ext 35, flexion 78, sup 65 - using it more -push up, carry shopping bag - flexion of elbow with sup partially    Status Achieved      OT LONG TERM GOAL #2   Title L digits flexion increase by more than 20 degrees in MC and PIP to hold 2 inch cylinder objects in ADL's and at work    Baseline MC flexion 25-30 degrees, PIP's 30-45 degrees - no grip  NOW - MC's flexion 22/ to 55 and PIP's 45 to 75 degrees -thumb increase - but cannot grip 2inch object or weight    Time 8    Period Weeks    Status On-going    Target Date 05/20/21      OT LONG TERM GOAL #3   Title L digits AROM increase for pt to touch palm to use in 25% of ADL's    Baseline see goal 2 for base line - no grip -and no using hand in any ADL's   NOW -Capsule tightness - possible surgery later this year - not able to touch palm - greatly impaired still -grip 15 lbs    Time 8    Period Weeks    Status On-going    Target Date 05/20/21      OT LONG TERM GOAL #4   Title update HEP for strengthening and proximal ROM as progress    Baseline await possible surgery for shoulder - adress grip some with focus on ROM    Status Achieved                   Plan - 04/18/21  1215     Clinical Impression Statement Pt seen since 12/31/20 for L UE brachial plexus injury after recovering from COVID - pt awaiting possible surgery for shoulder - and then also capsulectomy of L hand MC's - nerve conduction in end of OCt - numbness still in 4th and 5th - tinel progress this date to ulnar palm  -Pt continuing with therapy for strength and motion while waiting for surgery in the future.  Continues to demonstrate numbness in 4th more than 5th digits, difficulty with place and hold small finger on hand gripper to engage in tasks due to decreased sensation.  R Pt to cont to wear  [employer]  glove - and then can do tubigrip on hand to elbow days at work -  Reassess hand to shoulder AROM , grip and prehension - cont to show slow but steady progress - increase  motion at PIP's , grip strength , shoulder- pt to add some scapula stabilitization HEP. Try and use knucklebender some for MC flexion during playing games on xbox when using 2nd and 3rd PIP's -  Tinel at ulnar N improve to base of palm - was few wks ago mid forearm -  cont to focus on wrist extention -and digits MC flexion of 4th and 5th.  As well as grip strength Pt continues to benefit from skilled OT services to maximize safety and independence in necessary daily tasks at home, work and in the community.    OT Occupational Profile and History Problem Focused Assessment - Including review of records relating to presenting problem    Occupational performance deficits (Please refer to evaluation for details): ADL's;IADL's;Work;Play;Leisure;Social Participation    Body Structure / Function / Physical Skills ADL;Coordination;Flexibility;Edema;Dexterity;IADL;UE functional use;Strength;Decreased knowledge of use of DME;FMC;Pain    Rehab Potential Fair    Clinical Decision Making Several treatment options, min-mod task modification necessary    Comorbidities Affecting Occupational Performance: May have comorbidities impacting occupational  performance    Modification or Assistance to Complete Evaluation  Min-Moderate modification of tasks or assist with assess necessary to complete eval    OT Frequency 1x / week    OT Duration 6 weeks    OT Treatment/Interventions Self-care/ADL training;Paraffin;Moist Heat;Fluidtherapy;Contrast Bath;Therapeutic exercise;Manual Therapy;Patient/family education;Passive range of motion;Splinting;DME and/or AE instruction    Consulted and Agree with Plan of Care Patient             Patient will benefit from skilled therapeutic intervention in order to improve the following deficits and impairments:   Body Structure / Function / Physical Skills: ADL, Coordination, Flexibility, Edema, Dexterity, IADL, UE functional use, Strength, Decreased knowledge of use of DME, FMC, Pain       Visit Diagnosis: Muscle weakness (generalized)  Localized edema  Stiffness of left wrist, not elsewhere classified  Stiffness of left elbow, not elsewhere classified  Stiffness of left hand, not elsewhere classified  Stiffness of left shoulder, not elsewhere classified    Problem List Patient Active Problem List   Diagnosis Date Noted   Lymphedema of arm 12/11/2020   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   Sleeps in sitting position due to orthopnea 10/02/2020   Obesity with alveolar hypoventilation and body mass index (BMI) of 40 or greater (HCC) 10/02/2020   Chronic intermittent hypoxia with obstructive sleep apnea 10/02/2020   Pressure ulcer of sacral region, stage 4 (HCC) 09/05/2020   Diabetes mellitus (HCC) 09/05/2020   Dyspnea 08/23/2020   Drug induced constipation    Hypokalemia    COVID-19 long hauler manifesting chronic decreased mobility and endurance 06/17/2020   Debility 06/08/2020   Critical illness myopathy    Supplemental oxygen dependent    Essential hypertension    Brachial plexopathy    Neuropathic pain    Sleep apnea     Oletta Cohn, OTR/L, CLT 04/18/2021, 12:20  PM  [facility] [address], [place], [address] Phone: [phone]   Fax:  [phone]  Name: LORENCE NAGENGAST MRN: [id number] Date of Birth: 1985-12-11

## 2021-04-22 LAB — HEMOGLOBIN A1C: Hemoglobin A1C: 6.2

## 2021-04-24 ENCOUNTER — Ambulatory Visit: Payer: BC Managed Care – PPO | Admitting: Occupational Therapy

## 2021-04-24 DIAGNOSIS — M25642 Stiffness of left hand, not elsewhere classified: Secondary | ICD-10-CM

## 2021-04-24 DIAGNOSIS — M6281 Muscle weakness (generalized): Secondary | ICD-10-CM | POA: Diagnosis not present

## 2021-04-24 DIAGNOSIS — M25622 Stiffness of left elbow, not elsewhere classified: Secondary | ICD-10-CM

## 2021-04-24 DIAGNOSIS — M25632 Stiffness of left wrist, not elsewhere classified: Secondary | ICD-10-CM

## 2021-04-24 DIAGNOSIS — M25612 Stiffness of left shoulder, not elsewhere classified: Secondary | ICD-10-CM

## 2021-04-24 DIAGNOSIS — R600 Localized edema: Secondary | ICD-10-CM

## 2021-04-24 NOTE — Therapy (Signed)
[facility] [address] [place], [address] Phone: [phone]   Fax:  [phone]  Occupational Therapy Treatment  Patient Details  Name: Collin Brown MRN: [id number] Date of Birth: [birth year] Referring Provider (OT): Collin Brown,   Encounter Date: 04/24/2021   OT End of Session - 04/24/21 1414     Visit Number 25    Number of Visits 28    Date for OT Re-Evaluation 05/20/21    OT Start Time 1405    OT Stop Time 1447    OT Time Calculation (min) 42 min    Activity Tolerance Patient tolerated treatment well    Behavior During Therapy Collin Brown for tasks assessed/performed             Past Medical History:  Diagnosis Date   Acute blood loss anemia    Critical illness myopathy    Diabetes mellitus without complication (HCC)    Dyspnea 08/23/2020   Hearing loss in right ear    Hypertension    Pneumonia due to COVID-19 virus 05/04/2020   Sleep apnea     Past Surgical History:  Procedure Laterality Date   TONSILLECTOMY     WOUND DEBRIDEMENT N/A 06/29/2020   Procedure: DEBRIDEMENT SACRAL WOUND;  Surgeon: Collin Filler, MD;  Location: [facility] OR;  Service: General;  Laterality: N/A;    There were no vitals filed for this visit.   Subjective Assessment - 04/24/21 1406     Subjective  Arm about the same - did try use the knuckle bendre while playing games - but could not    Pertinent History Collin Brown is a 35 y.o. male who unfortunately contracted COVID in September 2021. He was ventilated for prolonged period of time and went in recovery notice he had bilateral arm weakness. His right upper extremity has since resolved with exception of some residual numbness and tingling in his fourth and fifth digit however he has noticed little improvement to his left upper extremity. He has had increased shoulder and elbow movement over the last couple of months, but still overall little improvement  compared to original baseline. He has been undergoing physical and occupational therapy and his pain has decreased over time. Detailed physical examination is above. However patient does have dependent edema in his left hand as well as a significant left wrist and fingers contracture. He is tender to palpation the proximal aspect of his left arm and does have anhidrosis noted with significant amount of dry skin on his left forearm. 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 C7-T1 is working, but very difficult to assess due to severe contractures and frozen joints. Would like the patient to undergo evaluation from orthopedic hand surgeon. At this point his bicep strength would be very hard to beat with a nerve transfer for elbow flexion, his main limitation in this regard is an elbow extension contracture. However possible consideration for radial to axillary nerve transfer and spinal accessory to suprascapular nerve transfer for increased movement of his shoulder depending on his progress. Potential options were discussed with the patient and his mother. We would like patient to see occupational therapist who specializes in brachial plexus injuries. We would like to see him back in clinic in approximately 2 months for reevaluation and progress. He was encouraged to reach out to [place] in the meantime for any  questions or concerns.    Patient Stated Goals Want to be able to use my L hand to bath, lift or grip , play games, put my belt on , pick up boxes and do my job    Currently in Pain? No/denies                          OT Treatments/Exercises (OP) - 04/24/21 0001       Moist Heat Therapy   Number Minutes Moist Heat 8 Minutes    Moist Heat Location --   hand , elbow L              prolonged flexion stretch by OT to  [facility] and PIP of L hand after heat  AROM intrinsic fist and place and  hold Hand gripper at 10 lbs - 12 reps - place and hold  Repeat 8 reps- increase flexion at 5th digit this date compare to 2 wks ago Table slides for wrist  ext 20 reps Wrist extetnion 2 1/2 lbs  place and hold 12 reps   2 sets  SUpination 2 1/2 lbs - cuff weight too - PROM end range - elbow only supported  2 x 12 reps RD, UD PROM  2 1/2 lbs RD, UD over edge of table      PROM for elbow flexion - 12 reps   Tinel  for ulnar N this date progress to ulnar palm - was 2 -3 wks ago in forearm -      OT Education - 04/24/21 1413     Education Details progress and HEP    Person(s) Educated Patient    Methods Explanation;Demonstration;Tactile cues;Verbal cues;Handout    Comprehension Verbal cues required;Returned demonstration;Verbalized understanding              OT Short Term Goals - 03/25/21 1857       OT SHORT TERM GOAL #1   Title Pt and mom to be independent in HEP to decrease edema and increase AROM in forearm to digits    Baseline edema decrease except upper arm , AROM in wrist improved , sup, digits flexion more in 2nd and 3rd , thumb than 4th and 5th    Status Achieved               OT Long Term Goals - 03/25/21 1858       OT LONG TERM GOAL #1   Title L wrist AROM increase by 15-20 degrees for sup, extention, flexion , UD to carry or hold objects on palm and push up from chair    Baseline wrist -25 ext, flexion 52, UD 5, sup 45- NOW wrist ext 35, flexion 78, sup 65 - using it more -push up, carry shopping bag - flexion of elbow with sup partially    Status Achieved      OT LONG TERM GOAL #2   Title L digits flexion increase by more than 20 degrees in MC and PIP to hold 2 inch cylinder objects in ADL's and at work    Baseline MC flexion 25-30 degrees, PIP's 30-45 degrees - no grip  NOW - MC's flexion 22/ to 55 and PIP's 45 to 75 degrees -thumb increase - but cannot grip 2inch object or weight    Time 8    Period Weeks    Status On-going    Target Date  05/20/21      OT LONG TERM GOAL #3  Title L digits AROM increase for pt to touch palm to use in 25% of ADL's    Baseline see goal 2 for base line - no grip -and no using hand in any ADL's   NOW -Capsule tightness - possible surgery later this year - not able to touch palm - greatly impaired still -grip 15 lbs    Time 8    Period Weeks    Status On-going    Target Date 05/20/21      OT LONG TERM GOAL #4   Title update HEP for strengthening and proximal ROM as progress    Baseline await possible surgery for shoulder - adress grip some with focus on ROM    Status Achieved                   Plan - 04/24/21 1414     Clinical Impression Statement Pt seen since 12/31/20 for L UE brachial plexus injury after recovering from COVID - pt awaiting possible surgery for shoulder - and then also capsulectomy of L hand MC's - nerve conduction in end of OCt - numbness still in 4th and 5th - tinel progress this date to ulnar palm  -Pt continuing with therapy for strength and motion while waiting for surgery in the future.  Continues to demonstrate numbness in 4th more than 5th digits, difficulty with place and hold small finger on hand gripper to engage in tasks due to decreased sensation.  R Pt to cont to wear  [employer]  glove - and then can do tubigrip on hand to elbow days at work - Pt cont to show slow but steady progress - increase motion at PIP's , grip strength , shoulder-  add lsat time some scapula stabilitization HEP.  Tinel at ulnar N improve to base of palm - was few wks ago mid forearm - Pt show more 5th flexion during place and hold with gripper set at 10 lbs - and wrist ext strength - stabilization.  Pt continues to benefit from skilled OT services to maximize safety and independence in necessary daily tasks at home, work and in the community.    OT Occupational Profile and History Problem Focused Assessment - Including review of records relating to presenting problem    Occupational  performance deficits (Please refer to evaluation for details): ADL's;IADL's;Work;Play;Leisure;Social Participation    Body Structure / Function / Physical Skills ADL;Coordination;Flexibility;Edema;Dexterity;IADL;UE functional use;Strength;Decreased knowledge of use of DME;FMC;Pain    Rehab Potential Fair    Clinical Decision Making Several treatment options, min-mod task modification necessary    Comorbidities Affecting Occupational Performance: May have comorbidities impacting occupational performance    Modification or Assistance to Complete Evaluation  Min-Moderate modification of tasks or assist with assess necessary to complete eval    OT Frequency 1x / week    OT Duration 6 weeks    OT Treatment/Interventions Self-care/ADL training;Paraffin;Moist Heat;Fluidtherapy;Contrast Bath;Therapeutic exercise;Manual Therapy;Patient/family education;Passive range of motion;Splinting;DME and/or AE instruction    Consulted and Agree with Plan of Care Patient             Patient will benefit from skilled therapeutic intervention in order to improve the following deficits and impairments:   Body Structure / Function / Physical Skills: ADL, Coordination, Flexibility, Edema, Dexterity, IADL, UE functional use, Strength, Decreased knowledge of use of DME, FMC, Pain       Visit Diagnosis: Muscle weakness (generalized)  Localized edema  Stiffness of left wrist, not elsewhere classified  Stiffness of left  elbow, not elsewhere classified  Stiffness of left hand, not elsewhere classified  Stiffness of left shoulder, not elsewhere classified    Problem List Patient Active Problem List   Diagnosis Date Noted   Lymphedema of arm 12/11/2020   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   Sleeps in sitting position due to orthopnea 10/02/2020   Obesity with alveolar hypoventilation and body mass index (BMI) of 40 or greater (HCC) 10/02/2020   Chronic intermittent hypoxia with obstructive sleep apnea  10/02/2020   Pressure ulcer of sacral region, stage 4 (HCC) 09/05/2020   Diabetes mellitus (HCC) 09/05/2020   Dyspnea 08/23/2020   Drug induced constipation    Hypokalemia    COVID-19 long hauler manifesting chronic decreased mobility and endurance 06/17/2020   Debility 06/08/2020   Critical illness myopathy    Supplemental oxygen dependent    Essential hypertension    Brachial plexopathy    Neuropathic pain    Sleep apnea     Oletta Cohn, OTR/L,CLT 04/24/2021, 2:53 PM  [facility] [address] [place], [address] Phone: [phone]   Fax:  [phone]  Name: Collin [facility] MRN: [id number] Date of Birth: 04/27/1986

## 2021-05-03 ENCOUNTER — Other Ambulatory Visit: Payer: Self-pay

## 2021-05-03 ENCOUNTER — Ambulatory Visit: Payer: BC Managed Care – PPO | Admitting: Occupational Therapy

## 2021-05-03 DIAGNOSIS — M25642 Stiffness of left hand, not elsewhere classified: Secondary | ICD-10-CM

## 2021-05-03 DIAGNOSIS — R600 Localized edema: Secondary | ICD-10-CM

## 2021-05-03 DIAGNOSIS — M25622 Stiffness of left elbow, not elsewhere classified: Secondary | ICD-10-CM

## 2021-05-03 DIAGNOSIS — M6281 Muscle weakness (generalized): Secondary | ICD-10-CM

## 2021-05-03 DIAGNOSIS — M25632 Stiffness of left wrist, not elsewhere classified: Secondary | ICD-10-CM

## 2021-05-03 DIAGNOSIS — M25612 Stiffness of left shoulder, not elsewhere classified: Secondary | ICD-10-CM

## 2021-05-03 NOTE — Therapy (Signed)
[place] [facility] [address] [place], [address] Phone: [phone]   Fax:  [phone]  Occupational Therapy Treatment  Patient Details  Name: Collin Brown MRN: [id number] Date of Birth: Aug 01, 1986 Referring Provider (OT): Dr Vladimir Creeks,   Encounter Date: 05/03/2021   OT End of Session - 05/03/21 1145     Visit Number 26    Number of Visits 28    Date for OT Re-Evaluation 05/20/21    OT Start Time 1000    OT Stop Time 1045    OT Time Calculation (min) 45 min    Activity Tolerance Patient tolerated treatment well    Behavior During Therapy [facility] for tasks assessed/performed             Past Medical History:  Diagnosis Date   Acute blood loss anemia    Critical illness myopathy    Diabetes mellitus without complication (HCC)    Dyspnea 08/23/2020   Hearing loss in right ear    Hypertension    Pneumonia due to COVID-19 virus 05/04/2020   Sleep apnea     Past Surgical History:  Procedure Laterality Date   TONSILLECTOMY     WOUND DEBRIDEMENT N/A 06/29/2020   Procedure: DEBRIDEMENT SACRAL WOUND;  Surgeon: Axel Filler, MD;  Location: [facility] OR;  Service: General;  Laterality: N/A;    There were no vitals filed for this visit.   Subjective Assessment - 05/03/21 1144     Subjective  I have nerve conduction 13th Oct and then after that Dr Franklyn Lor appt- trying to use it more    Pertinent History Ajdin Macke is a 35 y.o. male who unfortunately contracted COVID in September 2021. He was ventilated for prolonged period of time and went in recovery notice he had bilateral arm weakness. His right upper extremity has since resolved with exception of some residual numbness and tingling in his fourth and fifth digit however he has noticed little improvement to his left upper extremity. He has had increased shoulder and elbow movement over the last couple of months, but still overall little improvement  compared to original baseline. He has been undergoing physical and occupational therapy and his pain has decreased over time. Detailed physical examination is above. However patient does have dependent edema in his left hand as well as a significant left wrist and fingers contracture. He is tender to palpation the proximal aspect of his left arm and does have anhidrosis noted with significant amount of dry skin on his left forearm. 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 C7-T1 is working, but very difficult to assess due to severe contractures and frozen joints. Would like the patient to undergo evaluation from orthopedic hand surgeon. At this point his bicep strength would be very hard to beat with a nerve transfer for elbow flexion, his main limitation in this regard is an elbow extension contracture. However possible consideration for radial to axillary nerve transfer and spinal accessory to suprascapular nerve transfer for increased movement of his shoulder depending on his progress. Potential options were discussed with the patient and his mother. We would like patient to see occupational therapist who specializes in brachial plexus injuries. We would like to see him back in clinic in approximately 2 months for reevaluation and progress. He was encouraged to reach out to [place] in the meantime for any  questions or concerns.    Patient Stated Goals Want to be able to use my L hand to bath, lift or grip , play games, put my belt on , pick up boxes and do my job    Currently in Pain? No/denies                [facility] OT Assessment - 05/03/21 0001       Left Hand AROM   L Thumb MCP 0-60 50 Degrees    L Thumb IP 0-80 85 Degrees    L Thumb Radial ADduction/ABduction 0-55 50    L Thumb Palmar ADduction/ABduction 0-45 60    L Index  MCP 0-90 45 Degrees    L Index PIP 0-100 90 Degrees    L Long  MCP 0-90  50 Degrees    L Long PIP 0-100 75 Degrees    L Ring  MCP 0-90 50 Degrees    L Ring PIP 0-100 45 Degrees    L Little  MCP 0-90 30 Degrees    L Little PIP 0-100 75 Degrees                      OT Treatments/Exercises (OP) - 05/03/21 0001       Moist Heat Therapy   Number Minutes Moist Heat 8 Minutes    Moist Heat Location Hand   wrist and elbow           prolonged flexion stretch by OT to  [facility] and PIP of L hand after heat  PROM for thumb - soft tissue mobs to [facility] spreads, webspace and traction of digits lightly  AROM intrinsic fist and place and hold Hand gripper at 10 lbs - 2 x 12 reps - place and hold  increase flexion at 5th digit and can stay in contact now at times with gripper compare to 2 wks ago Table slides for wrist  ext 20 reps Wrist extetnion 2 1/2 lbs  place and hold 12 reps   2 sets  SUpination 2 1/2 lbs - cuff weight too - PROM end range - elbow only supported  2 x 12 reps RD, UD PROM  2 1/2 lbs RD, UD over edge of table       Tinel  for ulnar N this date to [facility]  ulnar palm -improving         OT Education - 05/03/21 1144     Education Details progress and HEP    Person(s) Educated Patient    Methods Explanation;Demonstration;Tactile cues;Verbal cues;Handout    Comprehension Verbal cues required;Returned demonstration;Verbalized understanding              OT Short Term Goals - 03/25/21 1857       OT SHORT TERM GOAL #1   Title Pt and mom to be independent in HEP to decrease edema and increase AROM in forearm to digits    Baseline edema decrease except upper arm , AROM in wrist improved , sup, digits flexion more in 2nd and 3rd , thumb than 4th and 5th    Status Achieved               OT Long Term Goals - 03/25/21 1858       OT LONG TERM GOAL #1   Title L wrist AROM increase by 15-20 degrees for sup, extention, flexion , UD to carry or hold objects on palm and push up from chair    Baseline wrist -  25 ext, flexion 52, UD 5,  sup 45- NOW wrist ext 35, flexion 78, sup 65 - using it more -push up, carry shopping bag - flexion of elbow with sup partially    Status Achieved      OT LONG TERM GOAL #2   Title L digits flexion increase by more than 20 degrees in MC and PIP to hold 2 inch cylinder objects in ADL's and at work    Baseline MC flexion 25-30 degrees, PIP's 30-45 degrees - no grip  NOW - MC's flexion 22/ to 55 and PIP's 45 to 75 degrees -thumb increase - but cannot grip 2inch object or weight    Time 8    Period Weeks    Status On-going    Target Date 05/20/21      OT LONG TERM GOAL #3   Title L digits AROM increase for pt to touch palm to use in 25% of ADL's    Baseline see goal 2 for base line - no grip -and no using hand in any ADL's   NOW -Capsule tightness - possible surgery later this year - not able to touch palm - greatly impaired still -grip 15 lbs    Time 8    Period Weeks    Status On-going    Target Date 05/20/21      OT LONG TERM GOAL #4   Title update HEP for strengthening and proximal ROM as progress    Baseline await possible surgery for shoulder - adress grip some with focus on ROM    Status Achieved                   Plan - 05/03/21 1146     Clinical Impression Statement Pt seen since 12/31/20 for L UE brachial plexus injury after recovering from COVID - pt awaiting possible surgery for shoulder - and then also capsulectomy of L hand MC's - nerve conduction 13th OCt - numbness still in 4th and 5th - tinel progress this date to [facility] on ulnar palm  - Pt show increase 5th digit flexion and able to do gripper better - place and hold and 5th able to touch gripper. Pt continuing with therapy for strength and motion while waiting for surgery in the future.  Continues to demonstrate numbness/pain in 4th more than 5th digits.  R Pt to cont to wear  [employer]  glove /or isotoner- and then can do tubigrip on hand to elbow days at work - Pt cont to show slow but steady progress - increase  motion at PIP's more than MC's , grip strength , shoulder-  Will reassess elbow , wrist and shoulder next time and adjust HEP as needed   Pt continues to benefit from skilled OT services to maximize safety and independence in necessary daily tasks at home, work and in the community.    OT Occupational Profile and History Problem Focused Assessment - Including review of records relating to presenting problem    Occupational performance deficits (Please refer to evaluation for details): ADL's;IADL's;Work;Play;Leisure;Social Participation    Body Structure / Function / Physical Skills ADL;Coordination;Flexibility;Edema;Dexterity;IADL;UE functional use;Strength;Decreased knowledge of use of DME;FMC;Pain    Rehab Potential Fair    Clinical Decision Making Several treatment options, min-mod task modification necessary    Comorbidities Affecting Occupational Performance: May have comorbidities impacting occupational performance    Modification or Assistance to Complete Evaluation  Min-Moderate modification of tasks or assist with assess necessary to complete eval  OT Frequency 1x / week    OT Duration 6 weeks    OT Treatment/Interventions Self-care/ADL training;Paraffin;Moist Heat;Fluidtherapy;Contrast Bath;Therapeutic exercise;Manual Therapy;Patient/family education;Passive range of motion;Splinting;DME and/or AE instruction    Consulted and Agree with Plan of Care Patient             Patient will benefit from skilled therapeutic intervention in order to improve the following deficits and impairments:   Body Structure / Function / Physical Skills: ADL, Coordination, Flexibility, Edema, Dexterity, IADL, UE functional use, Strength, Decreased knowledge of use of DME, FMC, Pain       Visit Diagnosis: Muscle weakness (generalized)  Localized edema  Stiffness of left wrist, not elsewhere classified  Stiffness of left elbow, not elsewhere classified  Stiffness of left hand, not elsewhere  classified  Stiffness of left shoulder, not elsewhere classified    Problem List Patient Active Problem List   Diagnosis Date Noted   Lymphedema of arm 12/11/2020   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   Sleeps in sitting position due to orthopnea 10/02/2020   Obesity with alveolar hypoventilation and body mass index (BMI) of 40 or greater (HCC) 10/02/2020   Chronic intermittent hypoxia with obstructive sleep apnea 10/02/2020   Pressure ulcer of sacral region, stage 4 (HCC) 09/05/2020   Diabetes mellitus (HCC) 09/05/2020   Dyspnea 08/23/2020   Drug induced constipation    Hypokalemia    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 [place], [address] Phone: [phone]   Fax:  [phone]  Name: CHOSEN GESKE MRN: [id number] Date of Birth: [birth year]

## 2021-05-10 ENCOUNTER — Ambulatory Visit: Payer: BC Managed Care – PPO | Admitting: Registered Nurse

## 2021-05-13 ENCOUNTER — Ambulatory Visit: Payer: BC Managed Care – PPO | Attending: Family Medicine | Admitting: Occupational Therapy

## 2021-05-13 DIAGNOSIS — M25612 Stiffness of left shoulder, not elsewhere classified: Secondary | ICD-10-CM | POA: Diagnosis present

## 2021-05-13 DIAGNOSIS — M6281 Muscle weakness (generalized): Secondary | ICD-10-CM | POA: Diagnosis present

## 2021-05-13 DIAGNOSIS — M25642 Stiffness of left hand, not elsewhere classified: Secondary | ICD-10-CM | POA: Insufficient documentation

## 2021-05-13 DIAGNOSIS — R600 Localized edema: Secondary | ICD-10-CM | POA: Diagnosis present

## 2021-05-13 DIAGNOSIS — M25632 Stiffness of left wrist, not elsewhere classified: Secondary | ICD-10-CM | POA: Insufficient documentation

## 2021-05-13 DIAGNOSIS — M25622 Stiffness of left elbow, not elsewhere classified: Secondary | ICD-10-CM | POA: Insufficient documentation

## 2021-05-13 NOTE — Therapy (Signed)
[facility] [address] [place], [address] Phone: [phone]   Fax:  [phone]  Occupational Therapy Treatment  Patient Details  Name: Collin Brown MRN: [id number] Date of Birth: 1986-01-22 Referring Provider (OT): Dr Vladimir Creeks,   Encounter Date: 05/13/2021   OT End of Session - 05/13/21 1206     Visit Number 27    Number of Visits 28    Date for OT Re-Evaluation 05/20/21    OT Start Time 1115    OT Stop Time 1158    OT Time Calculation (min) 43 min    Activity Tolerance Patient tolerated treatment well    Behavior During Therapy [facility] for tasks assessed/performed             Past Medical History:  Diagnosis Date   Acute blood loss anemia    Critical illness myopathy    Diabetes mellitus without complication (HCC)    Dyspnea 08/23/2020   Hearing loss in right ear    Hypertension    Pneumonia due to COVID-19 virus 05/04/2020   Sleep apnea     Past Surgical History:  Procedure Laterality Date   TONSILLECTOMY     WOUND DEBRIDEMENT N/A 06/29/2020   Procedure: DEBRIDEMENT SACRAL WOUND;  Surgeon: Axel Filler, MD;  Location: [facility] OR;  Service: General;  Laterality: N/A;    There were no vitals filed for this visit.   Subjective Assessment - 05/13/21 1205     Subjective  Doing okay - more pain today in my arm and hand - nerve conduction next wek and then Dr Franklyn Lor the 19th -    Pertinent History Collin Brown is a 35 y.o. male who unfortunately contracted COVID in September 2021. He was ventilated for prolonged period of time and went in recovery notice he had bilateral arm weakness. His right upper extremity has since resolved with exception of some residual numbness and tingling in his fourth and fifth digit however he has noticed little improvement to his left upper extremity. He has had increased shoulder and elbow movement over the last couple of months, but still overall little  improvement compared to original baseline. He has been undergoing physical and occupational therapy and his pain has decreased over time. Detailed physical examination is above. However patient does have dependent edema in his left hand as well as a significant left wrist and fingers contracture. He is tender to palpation the proximal aspect of his left arm and does have anhidrosis noted with significant amount of dry skin on his left forearm. 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 C7-T1 is working, but very difficult to assess due to severe contractures and frozen joints. Would like the patient to undergo evaluation from orthopedic hand surgeon. At this point his bicep strength would be very hard to beat with a nerve transfer for elbow flexion, his main limitation in this regard is an elbow extension contracture. However possible consideration for radial to axillary nerve transfer and spinal accessory to suprascapular nerve transfer for increased movement of his shoulder depending on his progress. Potential options were discussed with the patient and his mother. We would like patient to see occupational therapist who specializes in brachial plexus injuries. We would like to see him back in clinic in approximately 2 months for reevaluation and progress. He was encouraged to reach out to [place]  in the meantime for any questions or concerns.    Patient Stated Goals Want to be able to use my L hand to bath, lift or grip , play games, put my belt on , pick up boxes and do my job    Currently in Pain? Yes    Pain Score 3     Pain Location Arm    Pain Orientation Left    Pain Descriptors / Indicators Aching;Tender;Tightness                [facility] OT Assessment - 05/13/21 0001       AROM   Left Shoulder Extension 60 Degrees    Left Shoulder Flexion 95 Degrees    Left Shoulder ABduction 60 Degrees    Left  Elbow Flexion 110    Left Forearm Supination 65 Degrees    Left Wrist Extension 35 Degrees    Left Wrist Flexion 75 Degrees    Left Wrist Radial Deviation 25 Degrees    Left Wrist Ulnar Deviation 22 Degrees      Strength   Right Hand Grip (lbs) 71    Right Hand Lateral Pinch 27 lbs    Right Hand 3 Point Pinch 20 lbs    Left Hand Grip (lbs) 20    Left Hand Lateral Pinch 14 lbs      Left Hand AROM   L Thumb MCP 0-60 50 Degrees    L Thumb IP 0-80 85 Degrees    L Thumb Radial ADduction/ABduction 0-55 50    L Thumb Palmar ADduction/ABduction 0-45 60    L Index  MCP 0-90 45 Degrees    L Index PIP 0-100 90 Degrees    L Long  MCP 0-90 50 Degrees    L Long PIP 0-100 80 Degrees    L Ring  MCP 0-90 50 Degrees    L Ring PIP 0-100 50 Degrees    L Little  MCP 0-90 30 Degrees    L Little PIP 0-100 75 Degrees            measurement taken - not as much progress as last month- shoulder did increase - and some in the digits  prolonged flexion stretch by OT to  [facility] and PIP of L hand after heat  PROM for thumb - soft tissue mobs to [facility] spreads, webspace and traction of digits lightly  AROM intrinsic fist and place and hold Table slides for wrist  ext 20 reps SUpination 2 1/2 lbs - cuff weight too - PROM end range - elbow only supported  2 x 12 reps RD, UD PROM  2 1/2 lbs RD, UD over edge of table       Tinel  for ulnar N this date to [facility]  ulnar palm -improving            OT Treatments/Exercises (OP) - 05/13/21 0001       Moist Heat Therapy   Number Minutes Moist Heat 6 Minutes    Moist Heat Location --   shoulder and hand, forearm end of session                   OT Education - 05/13/21 1206     Education Details progress and HEP    Person(s) Educated Patient    Methods Explanation;Demonstration;Tactile cues;Verbal cues;Handout    Comprehension Verbal cues required;Returned demonstration;Verbalized understanding              OT Short Term Goals -  03/25/21 1857       OT SHORT TERM GOAL #1   Title Pt and mom to be independent in HEP to decrease edema and increase AROM in forearm to digits    Baseline edema decrease except upper arm , AROM in wrist improved , sup, digits flexion more in 2nd and 3rd , thumb than 4th and 5th    Status Achieved               OT Long Term Goals - 03/25/21 1858       OT LONG TERM GOAL #1   Title L wrist AROM increase by 15-20 degrees for sup, extention, flexion , UD to carry or hold objects on palm and push up from chair    Baseline wrist -25 ext, flexion 52, UD 5, sup 45- NOW wrist ext 35, flexion 78, sup 65 - using it more -push up, carry shopping bag - flexion of elbow with sup partially    Status Achieved      OT LONG TERM GOAL #2   Title L digits flexion increase by more than 20 degrees in MC and PIP to hold 2 inch cylinder objects in ADL's and at work    Baseline MC flexion 25-30 degrees, PIP's 30-45 degrees - no grip  NOW - MC's flexion 22/ to 55 and PIP's 45 to 75 degrees -thumb increase - but cannot grip 2inch object or weight    Time 8    Period Weeks    Status On-going    Target Date 05/20/21      OT LONG TERM GOAL #3   Title L digits AROM increase for pt to touch palm to use in 25% of ADL's    Baseline see goal 2 for base line - no grip -and no using hand in any ADL's   NOW -Capsule tightness - possible surgery later this year - not able to touch palm - greatly impaired still -grip 15 lbs    Time 8    Period Weeks    Status On-going    Target Date 05/20/21      OT LONG TERM GOAL #4   Title update HEP for strengthening and proximal ROM as progress    Baseline await possible surgery for shoulder - adress grip some with focus on ROM    Status Achieved                   Plan - 05/13/21 1206     Clinical Impression Statement Pt seen since 12/31/20 for L UE brachial plexus injury after recovering from COVID - pt awaiting possible surgery for shoulder - and then also  capsulectomy of L hand MC's - nerve conduction 11th OCt - numbness still in 4th and 5th - tinel progress this date same as last time to [facility] on ulnar palm  - Pt show increase 5th digit flexion and able to do gripper better - place and hold and 5th able to touch gripper the last month. Pt continuing with therapy for strength and motion while waiting for surgery in the future.  Continues to demonstrate numbness/pain in 4th more than 5th digits and tightness in the MC's more than PIP"s.  R Pt to cont to wear  [employer]  glove /or isotoner- and then can do tubigrip on hand to elbow days at work - Pt cont to show slow but steady progress - increase motion at PIP's more than MC's , grip strength , shoulder- Pt to  cont with ROM HE.  Pt continues to benefit from skilled OT services to maximize safety and independence in necessary daily tasks at home, work and in the community.    OT Occupational Profile and History Problem Focused Assessment - Including review of records relating to presenting problem    Occupational performance deficits (Please refer to evaluation for details): ADL's;IADL's;Work;Play;Leisure;Social Participation    Body Structure / Function / Physical Skills ADL;Coordination;Flexibility;Edema;Dexterity;IADL;UE functional use;Strength;Decreased knowledge of use of DME;FMC;Pain    Rehab Potential Fair    Clinical Decision Making Several treatment options, min-mod task modification necessary    Comorbidities Affecting Occupational Performance: May have comorbidities impacting occupational performance    Modification or Assistance to Complete Evaluation  Min-Moderate modification of tasks or assist with assess necessary to complete eval    OT Frequency Biweekly    OT Duration 2 weeks    OT Treatment/Interventions Self-care/ADL training;Paraffin;Moist Heat;Fluidtherapy;Contrast Bath;Therapeutic exercise;Manual Therapy;Patient/family education;Passive range of motion;Splinting;DME and/or AE  instruction    Consulted and Agree with Plan of Care Patient             Patient will benefit from skilled therapeutic intervention in order to improve the following deficits and impairments:   Body Structure / Function / Physical Skills: ADL, Coordination, Flexibility, Edema, Dexterity, IADL, UE functional use, Strength, Decreased knowledge of use of DME, FMC, Pain       Visit Diagnosis: Muscle weakness (generalized)  Localized edema  Stiffness of left wrist, not elsewhere classified  Stiffness of left hand, not elsewhere classified  Stiffness of left shoulder, not elsewhere classified    Problem List Patient Active Problem List   Diagnosis Date Noted   Lymphedema of arm 12/11/2020   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   Sleeps in sitting position due to orthopnea 10/02/2020   Obesity with alveolar hypoventilation and body mass index (BMI) of 40 or greater (HCC) 10/02/2020   Chronic intermittent hypoxia with obstructive sleep apnea 10/02/2020   Pressure ulcer of sacral region, stage 4 (HCC) 09/05/2020   Diabetes mellitus (HCC) 09/05/2020   Dyspnea 08/23/2020   Drug induced constipation    Hypokalemia    COVID-19 long hauler manifesting chronic decreased mobility and endurance 06/17/2020   Debility 06/08/2020   Critical illness myopathy    Supplemental oxygen dependent    Essential hypertension    Brachial plexopathy    Neuropathic pain    Sleep apnea     Oletta Cohn, OTR/L,CLT 05/13/2021, 12:10 PM  [place] [facility] [address], [place], [address] Phone: [phone]   Fax:  [phone]  Name: EHREN BERISHA MRN: [id number] Date of Birth: 18-Dec-1985

## 2021-05-22 NOTE — Progress Notes (Signed)
Chief Complaint  Patient presents with   Obstructive Sleep Apnea    Rm 1, alone. Here for initial CPAP f/u. Pt reports doing well. Pt reports being more alert, sleeping over 7hrs at night. Waking up less to use the restroom.      HISTORY OF PRESENT ILLNESS:  05/23/21 ALL:  Collin Brown is a 35 y.o. male here today for follow up for OSA on CPAP. Split night study 09/2020 showed severe OSA with AHI of 50.4/hr and O2 nadir of 81%. CPAP ordered with set pressure of 7cmH20. He reports doing very well on CPAP therapy. He is using CPAP every night for at least 4 hours. He feels much better rested and more alert during the day. He is working on healthy lifestyle changes for weight management.   Compliance report dated 04/23/2021-05/22/2021 shows that he used CPAP 30/30 days for greater than 4 hours. Average usage was about 8 hours. Residual AHI was 407 on set pressure of 7cmH20. He did have a large air leak in 95th percentile of 36.5l/min.    HISTORY (copied from Dr Dohmeier's previous note)  Collin Brown is a 23 - year- old  Caucasian male patient see upon consultation request on 09/05/2020 by FNP [name].  He is an active patient of [facility] for general neurology. We are addressing SLEEP MEDICINE only.    Chief concern according to patient : [name] has been falling asleep in sometimes dangerous situations, driving home from work, whenever not physically active or mentally stimulated he could easily drop off to sleep.  He works a lot of overtime and will at home he was basically just noted to go to sleep as soon as he could briefly taking a shower and then his family noticed that he had apnea at night.  His employment situation changed during the pandemic.  He was hospitalized with COVID-19 in September 2021 and had a prolonged hospital stay ICU for 16 day. Pneumonia, and still not able to return to work. He is now considered disabled. He worked at [employer].  His mother then  purchased a CPAP machine privately. He has used the CPAP everey day now. He has a good experience with it. Feels rested. He has a wound vac. Has his arm in a case.    The patient has not brought his CPAP here - " I was not told to ".   Collin Brown  has a past medical history of Acute blood loss anemia, Critical illness myopathy, Diabetes mellitus with complication ([place]),  Persistent SOB. 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 Long term ICU stay for COVID Pneumonia. Respiratory failure.  He was seen inn 07-2020 -  Dr Posey Pronto stated EMG and NCV were unsuccessful, she ordered gabapentin. .   The patient never had a sleep study.    Social history:  Patient is working in Scient[employer], research (medical)  and lives in a household with mother.  Family status is single The patient currently out n medical leave.    Tobacco use; never , second hand smoke.  ETOH use ; never ,  Caffeine intake in form of Coffee( /) Soda( 1-2 / day) Tea (/) or energy drinks. Regular exercise- none.     Sleep habits are as follows: all sleep habits have changed since hospitalization- The patient's dinner time is between 5 PM. The patient goes to bed at 10-11 PM and falls asleep for 4.5 hours ,  wakes from pain- needs more medication and goes back for another 2 hours of sleep. Total sleep time 5-7 hours, is on wound vac- the sounds are present all night.  The preferred sleep position is any comfortable position- right side, cant breath on his back, can stay on his left side ( ARM has to be elevated) , with the support of 3-4 pillows.  Dreams are reportedly rare since ICU.  7 AM is the usual rise time.  The patient wakes up spontaneously.  He reports not feeling refreshed or restored in AM, with symptoms such as dry mouth, morning headaches, and residual fatigue.  Naps are taken infrequently, every 2 weeks or such- lasting from 20-40 minutes and in relation to pain-  .   REVIEW OF SYSTEMS: Out of a complete 14 system review of symptoms, the patient complains only of the following symptoms, fatigue and all other reviewed systems are negative.   ALLERGIES: No Known Allergies   HOME MEDICATIONS: Outpatient Medications Prior to Visit  Medication Sig Dispense Refill   ascorbic acid (VITAMIN C) 500 MG tablet Take 1 tablet (500 mg total) by mouth daily. 30 tablet 0   aspirin 81 MG chewable tablet Chew 1 tablet (81 mg total) by mouth daily.     atorvastatin (LIPITOR) 20 MG tablet Take by mouth.     Blood Glucose Monitoring Suppl (ACCU-CHEK GUIDE ME) w/Device KIT USE TO CHECK BLOOD SUGAR UP TO 4 TIMES DAILY BEFORE MEALS AS DIRECTED 1 kit 0   Elastic Bandages & Supports (JOBST KNEE HIGH COMPRESSION SM) MISC 1 each by Does not apply route daily. 1 each 0   gabapentin (NEURONTIN) 800 MG tablet Take 1 tablet (800 mg total) by mouth 4 (four) times daily as needed. 120 tablet 3   Insulin Pen Needle (CAREFINE PEN NEEDLES) 32G X 6 MM MISC 1 applicator by Does not apply route 4 (four) times daily -  with meals and at bedtime. 200 each 1   LANTUS SOLOSTAR 100 UNIT/ML Solostar Pen Inject into the skin.     metFORMIN (GLUCOPHAGE-XR) 500 MG 24 hr tablet Take by mouth.     metoprolol tartrate (LOPRESSOR) 50 MG tablet Take 1.5 tablets (75 mg total) by mouth 2 (two) times daily. 180 tablet 0   Multiple Vitamin (MULTIVITAMIN WITH MINERALS) TABS tablet Take 1 tablet by mouth daily.     potassium chloride SA (KLOR-CON) 20 MEQ tablet Take 2 tablets by mouth twice daily 120 tablet 0   Semaglutide,0.25 or 0.5MG/DOS, 2 MG/1.5ML SOPN Inject into the skin.     No facility-administered medications prior to visit.     PAST MEDICAL HISTORY: Past Medical History:  Diagnosis Date   Acute blood loss anemia    Critical illness myopathy    Diabetes mellitus without complication ([place])    Dyspnea 08/23/2020   Hearing loss in right ear    Hypertension    Pneumonia due to COVID-19  virus 05/04/2020   Sleep apnea      PAST SURGICAL HISTORY: Past Surgical History:  Procedure Laterality Date   TONSILLECTOMY     WOUND DEBRIDEMENT N/A 06/29/2020   Procedure: DEBRIDEMENT SACRAL WOUND;  Surgeon: Ralene Ok, MD;  Location: [facility] OR;  Service: General;  Laterality: N/A;     FAMILY HISTORY: Family History  Problem Relation Age of Onset   Diabetes Mellitus II Mother    Hypertension Mother    Diabetes Mother    Diabetes Mellitus II Father    High blood pressure  Father    Heart attack Father      SOCIAL HISTORY: Social History   Socioeconomic History   Marital status: Single    Spouse name: Not on file   Number of children: Not on file   Years of education: Not on file   Highest education level: Not on file  Occupational History   Occupation: Herbalist  Tobacco Use   Smoking status: Never   Smokeless tobacco: Never  Vaping Use   Vaping Use: Never used  Substance and Sexual Activity   Alcohol use: No   Drug use: No   Sexual activity: Yes    Birth control/protection: None  Other Topics Concern   Not on file  Social History Narrative   Not on file   Social Determinants of Health   Financial Resource Strain: Not on file  Food Insecurity: Not on file  Transportation Needs: Not on file  Physical Activity: Not on file  Stress: Not on file  Social Connections: Not on file  Intimate Partner Violence: Not on file     PHYSICAL EXAM  Vitals:   05/23/21 0742  BP: 121/82  Pulse: 74  Weight: (!) 380 lb (172.4 kg)  Height: _0  (1.778 m)   Body mass index is 54.52 kg/m.  Generalized: Well developed, in no acute distress  Cardiology: normal rate and rhythm, no murmur auscultated  Respiratory: clear to auscultation bilaterally    Neurological examination  Mentation: Alert oriented to time, place, history taking. Follows all commands speech and language fluent Cranial nerve II-XII: Pupils were equal round reactive to light.  Extraocular movements were full, visual field were full on confrontational test. Facial sensation and strength were normal. Head turning and shoulder shrug  were normal and symmetric. Motor: The motor testing reveals 5 over 5 strength of all 4 extremities. Good symmetric motor tone is noted throughout.  Gait and station: Gait is normal.    DIAGNOSTIC DATA (LABS, IMAGING, TESTING) - I reviewed patient records, labs, notes, testing and imaging myself where available.  Lab Results  Component Value Date   WBC 7.7 10/15/2020   HGB 14.4 10/15/2020   HCT 43.2 10/15/2020   MCV 82.3 10/15/2020   PLT 278 10/15/2020      Component Value Date/Time   NA 137 10/15/2020 0000   NA 139 08/23/2020 1119   K 4.4 10/15/2020 0000   CL 102 10/15/2020 0000   CO2 25 10/15/2020 0000   GLUCOSE 244 (H) 10/15/2020 0000   BUN 15 10/15/2020 0000   BUN 11 08/23/2020 1119   CREATININE 0.73 10/15/2020 0000   CALCIUM 10.2 10/15/2020 0000   PROT 6.5 10/15/2020 0000   PROT 6.3 08/23/2020 1119   ALBUMIN 4.1 08/23/2020 1119   AST 17 10/15/2020 0000   ALT 30 10/15/2020 0000   ALKPHOS 68 08/23/2020 1119   BILITOT 0.3 10/15/2020 0000   BILITOT 0.3 08/23/2020 1119   GFRNONAA 122 10/15/2020 0000   GFRAA 141 10/15/2020 0000   Lab Results  Component Value Date   CHOL 230 (H) 08/23/2020   HDL 34 (L) 08/23/2020   LDLCALC 141 (H) 08/23/2020   TRIG 301 (H) 08/23/2020   CHOLHDL 6.8 (H) 08/23/2020   Lab Results  Component Value Date   HGBA1C 5.6 08/23/2020   No results found for: [id number] Lab Results  Component Value Date   TSH 1.040 08/23/2020    No flowsheet data found.   No flowsheet data found.   ASSESSMENT AND PLAN  35  y.o. year old male  has a past medical history of Acute blood loss anemia, Critical illness myopathy, Diabetes mellitus without complication ([place]), Dyspnea (08/23/2020), Hearing loss in right ear, Hypertension, Pneumonia due to COVID-19 virus (05/04/2020), and Sleep apnea. here with     OSA on CPAP  Collin Brown is doing much better. Compliance report reveals excellent daily and four hour compliance. He was encouraged to continue using CPAP nightly for at least 4 hours. He will monitor for leak at home. He was encouraged to continue focusing on healthy lifestyle habits. I will have him follow up in 6 months, sooner if needed. He verbalizes understanding and agreement with this plan.    No orders of the defined types were placed in this encounter.    No orders of the defined types were placed in this encounter.     Debbora Presto, MSN, FNP-C 05/23/2021, 7:58 AM  [facility] [address] [place], [place] [phone]

## 2021-05-22 NOTE — Patient Instructions (Addendum)
Please continue using your CPAP regularly. While your insurance requires that you use CPAP at least 4 hours each night on 70% of the nights, I recommend, that you not skip any nights and use it throughout the night if you can. Getting used to CPAP and staying with the treatment long term does take time and patience and discipline. Untreated obstructive sleep apnea when it is moderate to severe can have an adverse impact on cardiovascular health and raise her risk for heart disease, arrhythmias, hypertension, congestive heart failure, stroke and diabetes. Untreated obstructive sleep apnea causes sleep disruption, nonrestorative sleep, and sleep deprivation. This can have an impact on your day to day functioning and cause daytime sleepiness and impairment of cognitive function, memory loss, mood disturbance, and problems focussing. Using CPAP regularly can improve these symptoms.  Follow up in 6 months.  

## 2021-05-23 ENCOUNTER — Other Ambulatory Visit: Payer: Self-pay

## 2021-05-23 ENCOUNTER — Ambulatory Visit (INDEPENDENT_AMBULATORY_CARE_PROVIDER_SITE_OTHER): Payer: BC Managed Care – PPO | Admitting: Family Medicine

## 2021-05-23 ENCOUNTER — Encounter: Payer: Self-pay | Admitting: Family Medicine

## 2021-05-23 VITALS — BP 121/82 | HR 74 | Ht 70.0 in | Wt 380.0 lb

## 2021-05-23 DIAGNOSIS — Z9989 Dependence on other enabling machines and devices: Secondary | ICD-10-CM | POA: Diagnosis not present

## 2021-05-23 DIAGNOSIS — G4733 Obstructive sleep apnea (adult) (pediatric): Secondary | ICD-10-CM

## 2021-05-27 ENCOUNTER — Ambulatory Visit: Payer: BC Managed Care – PPO | Admitting: Occupational Therapy

## 2021-05-27 DIAGNOSIS — M25642 Stiffness of left hand, not elsewhere classified: Secondary | ICD-10-CM

## 2021-05-27 DIAGNOSIS — M6281 Muscle weakness (generalized): Secondary | ICD-10-CM

## 2021-05-27 DIAGNOSIS — M25632 Stiffness of left wrist, not elsewhere classified: Secondary | ICD-10-CM

## 2021-05-27 DIAGNOSIS — M25622 Stiffness of left elbow, not elsewhere classified: Secondary | ICD-10-CM

## 2021-05-27 DIAGNOSIS — M25612 Stiffness of left shoulder, not elsewhere classified: Secondary | ICD-10-CM

## 2021-05-27 NOTE — Therapy (Signed)
[facility] [address] [place], [address] Phone: [phone]   Fax:  [phone]  Occupational Therapy Treatment  Patient Details  Name: Collin Brown MRN: [id number] Date of Birth: 1986/07/29 Referring Provider (OT): Dr Vladimir Creeks,   Encounter Date: 05/27/2021   OT End of Session - 05/27/21 1342     Visit Number 28    Number of Visits 34    Date for OT Re-Evaluation 08/19/21    OT Start Time 1300    OT Stop Time 1350    OT Time Calculation (min) 50 min    Activity Tolerance Patient tolerated treatment well    Behavior During Therapy [facility] for tasks assessed/performed             Past Medical History:  Diagnosis Date   Acute blood loss anemia    Critical illness myopathy    Diabetes mellitus without complication (HCC)    Dyspnea 08/23/2020   Hearing loss in right ear    Hypertension    Pneumonia due to COVID-19 virus 05/04/2020   Sleep apnea     Past Surgical History:  Procedure Laterality Date   TONSILLECTOMY     WOUND DEBRIDEMENT N/A 06/29/2020   Procedure: DEBRIDEMENT SACRAL WOUND;  Surgeon: Axel Filler, MD;  Location: [facility] OR;  Service: General;  Laterality: N/A;    There were no vitals filed for this visit.   Subjective Assessment - 05/27/21 1341     Subjective  Had last night some pain in the shoulder- did had my nerve conduction test - seeing DR Franklyn Lor on Wed    Pertinent History Collin Brown is a 35 y.o. male who unfortunately contracted COVID in September 2021. He was ventilated for prolonged period of time and went in recovery notice he had bilateral arm weakness. His right upper extremity has since resolved with exception of some residual numbness and tingling in his fourth and fifth digit however he has noticed little improvement to his left upper extremity. He has had increased shoulder and elbow movement over the last couple of months, but still overall little  improvement compared to original baseline. He has been undergoing physical and occupational therapy and his pain has decreased over time. Detailed physical examination is above. However patient does have dependent edema in his left hand as well as a significant left wrist and fingers contracture. He is tender to palpation the proximal aspect of his left arm and does have anhidrosis noted with significant amount of dry skin on his left forearm. 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 C7-T1 is working, but very difficult to assess due to severe contractures and frozen joints. Would like the patient to undergo evaluation from orthopedic hand surgeon. At this point his bicep strength would be very hard to beat with a nerve transfer for elbow flexion, his main limitation in this regard is an elbow extension contracture. However possible consideration for radial to axillary nerve transfer and spinal accessory to suprascapular nerve transfer for increased movement of his shoulder depending on his progress. Potential options were discussed with the patient and his mother. We would like patient to see occupational therapist who specializes in brachial plexus injuries. We would like to see him back in clinic in approximately 2 months for reevaluation and progress. He was encouraged to reach out to [place] in the meantime  for any questions or concerns.    Patient Stated Goals Want to be able to use my L hand to bath, lift or grip , play games, put my belt on , pick up boxes and do my job    Currently in Pain? No/denies                [facility] OT Assessment - 05/27/21 0001       AROM   Left Shoulder Extension 55 Degrees    Left Shoulder Flexion 95 Degrees    Left Shoulder ABduction 65 Degrees    Left Elbow Flexion 120    Left Forearm Supination 75 Degrees    Left Wrist Extension 40 Degrees    Left Wrist Flexion 73  Degrees    Left Wrist Radial Deviation 25 Degrees    Left Wrist Ulnar Deviation 25 Degrees      Strength   Right Hand Grip (lbs) 71    Right Hand Lateral Pinch 27 lbs    Right Hand 3 Point Pinch 20 lbs    Left Hand Grip (lbs) 15    Left Hand Lateral Pinch 14 lbs   wrist stabilize   Left Hand 3 Point Pinch 11 lbs   stabilze wrist     Left Hand AROM   L Thumb MCP 0-60 50 Degrees    L Thumb IP 0-80 85 Degrees    L Thumb Radial ADduction/ABduction 0-55 50    L Thumb Palmar ADduction/ABduction 0-45 60    L Index  MCP 0-90 45 Degrees    L Index PIP 0-100 85 Degrees    L Long  MCP 0-90 50 Degrees    L Long PIP 0-100 70 Degrees    L Ring  MCP 0-90 50 Degrees    L Ring PIP 0-100 45 Degrees    L Little  MCP 0-90 30 Degrees    L Little PIP 0-100 70 Degrees               measurement taken - not as much progress as last month- shoulder did increase , elbow , wrist extention and 3 point pinch with wrist stabilized     prolonged flexion stretch by OT to  [facility] and PIP of L hand after heat  PROM for thumb - soft tissue mobs to [facility] spreads, webspace and traction of digits lightly  AROM intrinsic fist and place and hold Hand gripper at 10 lbs - 12 reps and place and hold  X 3  And add to HEP - use Benik neoprene during gripper use for wrist support     Tinel  for ulnar N this date to [facility]  ulnar palm -improving          OT Treatments/Exercises (OP) - 05/27/21 0001       Moist Heat Therapy   Number Minutes Moist Heat 6 Minutes    Moist Heat Location --   prior to ROM                   OT Education - 05/27/21 1342     Education Details progress and HEP    Person(s) Educated Patient    Methods Explanation;Demonstration;Tactile cues;Verbal cues;Handout    Comprehension Verbal cues required;Returned demonstration;Verbalized understanding              OT Short Term Goals - 03/25/21 1857       OT SHORT TERM GOAL #1   Title Pt and mom to be independent  in HEP  to decrease edema and increase AROM in forearm to digits    Baseline edema decrease except upper arm , AROM in wrist improved , sup, digits flexion more in 2nd and 3rd , thumb than 4th and 5th    Status Achieved               OT Long Term Goals - 05/27/21 1515       OT LONG TERM GOAL #1   Title L wrist AROM increase by 15-20 degrees for sup, extention, flexion , UD to carry or hold objects on palm and push up from chair    Baseline wrist -25 ext, flexion 52, UD 5, sup 45- NOW wrist ext 35, flexion 78, sup 65 - using it more -push up, carry shopping bag - flexion of elbow with sup partially    Status Achieved      OT LONG TERM GOAL #2   Title L digits flexion increase by more than 20 degrees in MC and PIP to hold 2 inch cylinder objects in ADL's and at work    Baseline MC flexion 25-30 degrees, PIP's 30-45 degrees - no grip  NOW - MC's flexion 22/ to 55 and PIP's 45 to 75 degrees -thumb increase - but cannot grip 2inch object or weight    Time 12    Period Weeks    Status On-going    Target Date 08/19/21      OT LONG TERM GOAL #3   Title L digits AROM increase for pt to touch palm to use in 25% of ADL's    Baseline see goal 2 for base line - no grip -and no using hand in any ADL's   NOW -Capsule tightness - possible surgery later this year - not able to touch palm - greatly impaired still -grip 15 lbs    Time 12    Period Weeks    Status On-going    Target Date 08/19/21      OT LONG TERM GOAL #4   Title update HEP for strengthening and proximal ROM as progress    Baseline await possible surgery for shoulder - adress grip some with focus on ROM    Status Achieved                   Plan - 05/27/21 1510     Clinical Impression Statement Pt seen since 12/31/20 for L UE brachial plexus injury after recovering from COVID - pt awaiting possible surgery for shoulder and capsulectomy of L hand MC's - nerve conduction  test was done last week - numbness still in 4th and 5th -  tinel progress this date same as last time to [facility] on ulnar palm  - Pt cont to show increase shoulder , elbow , wrist AROM - slow but steady - pt able to keep shoulder more retracted ,and elbow at 90 degrees flexion with sup /pro , increase elbow flexion - and wrist extention.  This date show about same AROM for digits and grip/lat grip - but 3 point pinch increase with wrist supported at neutral.  Pt continuing with therapy for strength and motion while waiting for surgery in the future.  Continues to demonstrate numbness/pain in 4th more than 5th digits and tightness in the MC's more than PIP"s.  Pt report feeling edema not problem in arm anymore and not wearing compession - provided pt with gripper set at 10 lbs to use at home after PROM for digits -  and soft wrist splint to prevent flexion of wrist.  Pt cont to show slow but steady progress. Pt to cont with ROM HEP.  Pt continues to benefit from skilled OT services to maximize safety and independence in necessary daily tasks at home, work and in the community.    OT Occupational Profile and History Problem Focused Assessment - Including review of records relating to presenting problem    Occupational performance deficits (Please refer to evaluation for details): ADL's;IADL's;Work;Play;Leisure;Social Participation    Body Structure / Function / Physical Skills ADL;Coordination;Flexibility;Edema;Dexterity;IADL;UE functional use;Strength;Decreased knowledge of use of DME;FMC;Pain    Rehab Potential Fair    Clinical Decision Making Several treatment options, min-mod task modification necessary    Comorbidities Affecting Occupational Performance: May have comorbidities impacting occupational performance    Modification or Assistance to Complete Evaluation  Min-Moderate modification of tasks or assist with assess necessary to complete eval    OT Frequency Biweekly    OT Duration 12 weeks    OT Treatment/Interventions Self-care/ADL training;Paraffin;Moist  Heat;Fluidtherapy;Contrast Bath;Therapeutic exercise;Manual Therapy;Patient/family education;Passive range of motion;Splinting;DME and/or AE instruction    Consulted and Agree with Plan of Care Patient             Patient will benefit from skilled therapeutic intervention in order to improve the following deficits and impairments:   Body Structure / Function / Physical Skills: ADL, Coordination, Flexibility, Edema, Dexterity, IADL, UE functional use, Strength, Decreased knowledge of use of DME, FMC, Pain       Visit Diagnosis: Muscle weakness (generalized) - Plan: Ot plan of care cert/re-cert  Stiffness of left wrist, not elsewhere classified - Plan: Ot plan of care cert/re-cert  Stiffness of left hand, not elsewhere classified - Plan: Ot plan of care cert/re-cert  Stiffness of left shoulder, not elsewhere classified - Plan: Ot plan of care cert/re-cert  Stiffness of left elbow, not elsewhere classified - Plan: Ot plan of care cert/re-cert    Problem List Patient Active Problem List   Diagnosis Date Noted   Lymphedema of arm 12/11/2020   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   Sleeps in sitting position due to orthopnea 10/02/2020   Obesity with alveolar hypoventilation and body mass index (BMI) of 40 or greater (HCC) 10/02/2020   Chronic intermittent hypoxia with obstructive sleep apnea 10/02/2020   Pressure ulcer of sacral region, stage 4 (HCC) 09/05/2020   Diabetes mellitus (HCC) 09/05/2020   Dyspnea 08/23/2020   Drug induced constipation    Hypokalemia    COVID-19 long hauler manifesting chronic decreased mobility and endurance 06/17/2020   Debility 06/08/2020   Critical illness myopathy    Supplemental oxygen dependent    Essential hypertension    Brachial plexopathy    Neuropathic pain    Sleep apnea     Oletta Cohn, OTR/L,CLT 05/27/2021, 3:17 PM  [facility] [address], [place], [address] Phone: [phone]   Fax:  [phone]  Name: Collin Brown MRN: [id number] Date of Birth: Dec 30, 1985

## 2021-06-07 ENCOUNTER — Ambulatory Visit: Payer: BC Managed Care – PPO | Admitting: Registered Nurse

## 2021-06-11 ENCOUNTER — Ambulatory Visit: Payer: BC Managed Care – PPO | Attending: Family Medicine | Admitting: Occupational Therapy

## 2021-06-11 DIAGNOSIS — M25632 Stiffness of left wrist, not elsewhere classified: Secondary | ICD-10-CM | POA: Diagnosis present

## 2021-06-11 DIAGNOSIS — M25622 Stiffness of left elbow, not elsewhere classified: Secondary | ICD-10-CM | POA: Insufficient documentation

## 2021-06-11 DIAGNOSIS — M25612 Stiffness of left shoulder, not elsewhere classified: Secondary | ICD-10-CM | POA: Diagnosis present

## 2021-06-11 DIAGNOSIS — M25642 Stiffness of left hand, not elsewhere classified: Secondary | ICD-10-CM | POA: Insufficient documentation

## 2021-06-11 DIAGNOSIS — M6281 Muscle weakness (generalized): Secondary | ICD-10-CM | POA: Diagnosis not present

## 2021-06-11 DIAGNOSIS — R600 Localized edema: Secondary | ICD-10-CM | POA: Insufficient documentation

## 2021-06-11 NOTE — Therapy (Signed)
[facility] [address], [place], [address] Phone: [phone]   Fax:  [phone]  Occupational Therapy Treatment  Patient Details  Name: Collin Brown MRN: [id number] Date of Birth: 09-21-1985 Referring Provider (OT): Dr Daryll Drown,   Encounter Date: 06/11/2021   OT End of Session - 06/11/21 1645     Visit Number 29    Number of Visits 34    Date for OT Re-Evaluation 08/19/21    OT Start Time [id number]    OT Stop Time 1313    OT Time Calculation (min) 43 min    Activity Tolerance Patient tolerated treatment well    Behavior During Therapy [facility] for tasks assessed/performed             Past Medical History:  Diagnosis Date   Acute blood loss anemia    Critical illness myopathy    Diabetes mellitus without complication ([place])    Dyspnea 08/23/2020   Hearing loss in right ear    Hypertension    Pneumonia due to COVID-19 virus 05/04/2020   Sleep apnea     Past Surgical History:  Procedure Laterality Date   TONSILLECTOMY     WOUND DEBRIDEMENT N/A 06/29/2020   Procedure: DEBRIDEMENT SACRAL WOUND;  Surgeon: Ralene Ok, MD;  Location: [facility];  Service: General;  Laterality: N/A;    There were no vitals filed for this visit.   Subjective Assessment - 06/11/21 1238     Subjective  I seen DR Jarold Song 10/19 and going to do surgery the 7th of Nov to decompress my Ulnar N - that they say will provide hopely more motion in my hands and knuckles    Pertinent History [name] is a 35 y.o. male who unfortunately contracted COVID in September 2021. He was ventilated for prolonged period of time and went in recovery notice he had bilateral arm weakness. His right upper extremity has since resolved with exception of some residual numbness and tingling in his fourth and fifth digit however he has noticed little improvement to his left upper extremity. He has had increased shoulder and elbow movement over  the last couple of months, but still overall little improvement compared to original baseline. He has been undergoing physical and occupational therapy and his pain has decreased over time. Detailed physical examination is above. However patient does have dependent edema in his left hand as well as a significant left wrist and fingers contracture. He is tender to palpation the proximal aspect of his left arm and does have anhidrosis noted with significant amount of dry skin on his left forearm. 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 C7-T1 is working, but very difficult to assess due to severe contractures and frozen joints. Would like the patient to undergo evaluation from orthopedic hand surgeon. At this point his bicep strength would be very hard to beat with a nerve transfer for elbow flexion, his main limitation in this regard is an elbow extension contracture. However possible consideration for radial to axillary nerve transfer and spinal accessory to suprascapular nerve transfer for increased movement of his shoulder depending on his progress. Potential options were discussed with the patient and his mother. We would like patient to see occupational therapist who specializes in brachial plexus injuries. We would like to see him back in clinic in approximately 2 months for reevaluation  and progress. He was encouraged to reach out to [place] in the meantime for any questions or concerns.    Patient Stated Goals Want to be able to use my L hand to bath, lift or grip , play games, put my belt on , pick up boxes and do my job    Currently in Pain? No/denies                [facility] OT Assessment - 06/11/21 0001       AROM   Left Shoulder Extension 60 Degrees    Left Shoulder Flexion 90 Degrees    Left Shoulder ABduction 70 Degrees    Left Elbow Flexion 115    Left Forearm Supination 75 Degrees    Left  Wrist Extension 40 Degrees    Left Wrist Flexion 78 Degrees    Left Wrist Radial Deviation 25 Degrees    Left Wrist Ulnar Deviation 25 Degrees      Strength   Left Hand Grip (lbs) 20    Left Hand Lateral Pinch 14 lbs   stabilize 16   Left Hand 3 Point Pinch 12 lbs   stabilize wrist     Left Hand AROM   L Index  MCP 0-90 45 Degrees    L Index PIP 0-100 90 Degrees    L Long  MCP 0-90 54 Degrees    L Long PIP 0-100 80 Degrees    L Ring  MCP 0-90 50 Degrees    L Ring PIP 0-100 50 Degrees    L Little  MCP 0-90 20 Degrees    L Little PIP 0-100 80 Degrees                  measurement taken - some progress in prehension when wrist supported , wrist and digits- as well as shoulder- see flowsheet     prolonged flexion stretch by OT to  [facility] and PIP of L hand and pt to cont with  PROM for thumb - soft tissue mobs to [facility] spreads, webspace and traction of digits lightly  AROM intrinsic fist and place and hold Hand gripper at 10 lbs - 12 reps and place and hold  X 3  use Benik neoprene during gripper use for wrist support     Upgrade for wrist extention place and hold - 4 lbs football weight with strap around the hand And sup /pro same 4 lbs weight =but support end range into sup / pron 12 reps Cont wrist ext stretch and table slides  And cont shoulder and elbow HEP                 OT Education - 06/11/21 [id number]     Education Details progress and HEP    Person(s) Educated Patient    Methods Explanation;Demonstration;Tactile cues;Verbal cues;Handout    Comprehension Verbal cues required;Returned demonstration;Verbalized understanding              OT Short Term Goals - 03/25/21 1857       OT SHORT TERM GOAL #1   Title Pt and mom to be independent in HEP to decrease edema and increase AROM in forearm to digits    Baseline edema decrease except upper arm , AROM in wrist improved , sup, digits flexion more in 2nd and 3rd , thumb than 4th and 5th    Status Achieved                OT Long Term Goals -  05/27/21 1515       OT LONG TERM GOAL #1   Title L wrist AROM increase by 15-20 degrees for sup, extention, flexion , UD to carry or hold objects on palm and push up from chair    Baseline wrist -25 ext, flexion 52, UD 5, sup 45- NOW wrist ext 35, flexion 78, sup 65 - using it more -push up, carry shopping bag - flexion of elbow with sup partially    Status Achieved      OT LONG TERM GOAL #2   Title L digits flexion increase by more than 20 degrees in MC and PIP to hold 2 inch cylinder objects in ADL's and at work    Baseline MC flexion 25-30 degrees, PIP's 30-45 degrees - no grip  NOW - MC's flexion 22/ to 55 and PIP's 45 to 75 degrees -thumb increase - but cannot grip 2inch object or weight    Time 12    Period Weeks    Status On-going    Target Date 08/19/21      OT LONG TERM GOAL #3   Title L digits AROM increase for pt to touch palm to use in 25% of ADL's    Baseline see goal 2 for base line - no grip -and no using hand in any ADL's   NOW -Capsule tightness - possible surgery later this year - not able to touch palm - greatly impaired still -grip 15 lbs    Time 12    Period Weeks    Status On-going    Target Date 08/19/21      OT LONG TERM GOAL #4   Title update HEP for strengthening and proximal ROM as progress    Baseline await possible surgery for shoulder - adress grip some with focus on ROM    Status Achieved                   Plan - 06/11/21 1646     Clinical Impression Statement Pt seen since 12/31/20 for L UE brachial plexus injury after recovering from COVID - Pt  showed increase shoulder , elbow , wrist AROM - slow but steady - pt able to keep shoulder more retracted ,and elbow at 90 degrees flexion with sup /pro , increase elbow flexion - and wrist extention.  This date show about same AROM for digits and grip/ but lat and 3 point pinch increase with wrist supported at neutral.  Pt schedule for 17th Nov Ulnar N  decompression by Dr Franklyn Lor to hopely provide more motion and sensation in 4th and 5th digits- and AROM in MC's of digits and thumb. Pt upgrade to 4 lbs for wrist extention at place and hold - cont hand gripper at 10 lbs - and to do PROM until surgery. Pt report feeling edema not problem in arm anymore and not wearing compession - soft wrist splint to prevent flexion of wrist with use.  Pt cont to show slow but steady progress. Pt to cont with ROM and strengthening  HEP until surgery -and will do one visit prior to get base line measurements.    OT Occupational Profile and History Problem Focused Assessment - Including review of records relating to presenting problem    Occupational performance deficits (Please refer to evaluation for details): ADL's;IADL's;Work;Play;Leisure;Social Participation    Body Structure / Function / Physical Skills ADL;Coordination;Flexibility;Edema;Dexterity;IADL;UE functional use;Strength;Decreased knowledge of use of DME;FMC;Pain    Rehab Potential Fair    Clinical Decision Making  Several treatment options, min-mod task modification necessary    Comorbidities Affecting Occupational Performance: May have comorbidities impacting occupational performance    Modification or Assistance to Complete Evaluation  Min-Moderate modification of tasks or assist with assess necessary to complete eval    OT Frequency Biweekly    OT Duration 12 weeks    OT Treatment/Interventions Self-care/ADL training;Paraffin;Moist Heat;Fluidtherapy;Contrast Bath;Therapeutic exercise;Manual Therapy;Patient/family education;Passive range of motion;Splinting;DME and/or AE instruction    Consulted and Agree with Plan of Care Patient             Patient will benefit from skilled therapeutic intervention in order to improve the following deficits and impairments:   Body Structure / Function / Physical Skills: ADL, Coordination, Flexibility, Edema, Dexterity, IADL, UE functional use, Strength, Decreased  knowledge of use of DME, FMC, Pain       Visit Diagnosis: Muscle weakness (generalized)  Stiffness of left wrist, not elsewhere classified  Stiffness of left hand, not elsewhere classified  Stiffness of left shoulder, not elsewhere classified  Stiffness of left elbow, not elsewhere classified  Localized edema    Problem List Patient Active Problem List   Diagnosis Date Noted   Lymphedema of arm 12/11/2020   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   Sleeps in sitting position due to orthopnea 10/02/2020   Obesity with alveolar hypoventilation and body mass index (BMI) of 40 or greater ([place]) 10/02/2020   Chronic intermittent hypoxia with obstructive sleep apnea 10/02/2020   Pressure ulcer of sacral region, stage 4 ([place]) 09/05/2020   Diabetes mellitus ([place]) 09/05/2020   Dyspnea 08/23/2020   Drug induced constipation    Hypokalemia    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, [place], [address] Phone: [phone]   Fax:  [phone]  Name: ARNOLD HEATHERINGTON MRN: [id number] Date of Birth: [birth year]

## 2021-06-13 ENCOUNTER — Encounter: Payer: Self-pay | Admitting: Family Medicine

## 2021-06-13 ENCOUNTER — Ambulatory Visit (INDEPENDENT_AMBULATORY_CARE_PROVIDER_SITE_OTHER): Payer: BC Managed Care – PPO | Admitting: Family Medicine

## 2021-06-13 ENCOUNTER — Other Ambulatory Visit: Payer: Self-pay

## 2021-06-13 VITALS — BP 110/80 | HR 83 | Resp 16 | Ht 70.0 in | Wt 376.8 lb

## 2021-06-13 DIAGNOSIS — E1169 Type 2 diabetes mellitus with other specified complication: Secondary | ICD-10-CM

## 2021-06-13 DIAGNOSIS — M792 Neuralgia and neuritis, unspecified: Secondary | ICD-10-CM

## 2021-06-13 DIAGNOSIS — Z0000 Encounter for general adult medical examination without abnormal findings: Secondary | ICD-10-CM | POA: Diagnosis not present

## 2021-06-13 DIAGNOSIS — K625 Hemorrhage of anus and rectum: Secondary | ICD-10-CM | POA: Diagnosis not present

## 2021-06-13 DIAGNOSIS — Z1159 Encounter for screening for other viral diseases: Secondary | ICD-10-CM

## 2021-06-13 DIAGNOSIS — Z6841 Body Mass Index (BMI) 40.0 and over, adult: Secondary | ICD-10-CM

## 2021-06-13 DIAGNOSIS — E1159 Type 2 diabetes mellitus with other circulatory complications: Secondary | ICD-10-CM | POA: Diagnosis not present

## 2021-06-13 DIAGNOSIS — Z23 Encounter for immunization: Secondary | ICD-10-CM

## 2021-06-13 DIAGNOSIS — E785 Hyperlipidemia, unspecified: Secondary | ICD-10-CM

## 2021-06-13 DIAGNOSIS — I152 Hypertension secondary to endocrine disorders: Secondary | ICD-10-CM

## 2021-06-13 NOTE — Assessment & Plan Note (Signed)
We will take over Rxing gabapentin for next refills No longer seeing pain clinic

## 2021-06-13 NOTE — Assessment & Plan Note (Signed)
Previously well controlled Continue statin Repeat FLP and CMP Goal LDL < 70 

## 2021-06-13 NOTE — Assessment & Plan Note (Signed)
Discussed importance of healthy weight management Discussed diet and exercise  

## 2021-06-13 NOTE — Assessment & Plan Note (Signed)
Followed by endocrinology No changes to medications Reviewed last A1c and screenings Well-controlled on last A1c of 6.2

## 2021-06-13 NOTE — Assessment & Plan Note (Signed)
New problem Possibly hemorrhoidal bleeding, but he does describe a larger amount of blood, so agree with patient's request for referral to GI today Check CBC

## 2021-06-13 NOTE — Progress Notes (Signed)
Complete physical exam   Patient: Collin Brown   DOB: 18-Jul-1986   35 y.o. Male  MRN: [id number] Visit Date: 06/13/2021  Today's healthcare provider: Lavon Paganini, MD   Chief Complaint  Patient presents with   Annual Exam   Subjective    Collin Brown is a 35 y.o. male who presents today for a complete physical exam.  He reports consuming a  more protein and cutting back on carbs  diet. The patient does not participate in regular exercise at present. He generally feels well. He reports sleeping fairly well. He does have additional problems to discuss today.  HPI  Patient followed by Endocrine.  Scheduled to have surgery in two weeks, Left elbow.  Patient c/o rectal bleeding.This has been going on for 3-4 months. Reports that he sees the blood after having a bowel movement. He has noticed more blood while at work, thinks it might be from stress. Does see bleeding when at home, not as much when he is at work. Associated symptoms: Nausea,rectal pain, blood in stool and abdominal pain. 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   Sleep apnea    Past Surgical History:  Procedure Laterality Date   TONSILLECTOMY     WOUND DEBRIDEMENT N/A 06/29/2020   Procedure: DEBRIDEMENT SACRAL WOUND;  Surgeon: Ralene Ok, MD;  Location: [facility];  Service: General;  Laterality: N/A;   Social History   Socioeconomic History   Marital status: Single    Spouse name: Not on file   Number of children: Not on file   Years of education: Not on file   Highest education level: Not on file   Occupational History   Occupation: Herbalist  Tobacco Use   Smoking status: Never   Smokeless tobacco: Never  Vaping Use   Vaping Use: Never used  Substance and Sexual Activity   Alcohol use: No   Drug use: No   Sexual activity: Yes    Birth control/protection: None  Other Topics Concern   Not on file  Social History Narrative   Not on file   Social Determinants of Health   Financial Resource Strain: Not on file  Food Insecurity: Not on file  Transportation Needs: Not on file  Physical Activity: Not on file  Stress: Not on file  Social Connections: Not on file  Intimate Partner Violence: Not on file   Family Status  Relation Name Status   Mother  Alive   Father  Alive   Family History  Problem Relation Age of Onset   Diabetes Mellitus II Mother    Hypertension Mother    Diabetes Mother    Diabetes Mellitus II Father    High blood pressure Father    Heart attack Father    No Known Allergies  Patient Care Team: Felipe Paluch, Dionne Bucy, MD as PCP - General (Family Medicine)   Medications: Outpatient Medications Prior to Visit  Medication Sig   ascorbic acid (VITAMIN C) 500 MG tablet Take 1  tablet (500 mg total) by mouth daily.   aspirin 81 MG chewable tablet Chew 1 tablet (81 mg total) by mouth daily.   atorvastatin (LIPITOR) 20 MG tablet Take by mouth.   Blood Glucose Monitoring Suppl (ACCU-CHEK GUIDE ME) w/Device KIT USE TO CHECK BLOOD SUGAR UP TO 4 TIMES DAILY BEFORE MEALS AS DIRECTED   Elastic Bandages & Supports (JOBST KNEE HIGH COMPRESSION SM) MISC 1 each by Does not apply route daily.   gabapentin (NEURONTIN) 800 MG tablet Take 1 tablet (800 mg total) by mouth 4 (four) times daily as needed.   Insulin Pen Needle (CAREFINE PEN NEEDLES) 32G X 6 MM MISC 1 applicator by Does not apply route 4 (four) times daily -  with meals and at bedtime.   LANTUS SOLOSTAR 100 UNIT/ML Solostar Pen Inject into the skin.   metFORMIN (GLUCOPHAGE-XR) 500 MG 24 hr tablet Take  by mouth.   metoprolol tartrate (LOPRESSOR) 50 MG tablet Take 1.5 tablets (75 mg total) by mouth 2 (two) times daily.   Multiple Vitamin (MULTIVITAMIN WITH MINERALS) TABS tablet Take 1 tablet by mouth daily.   potassium chloride SA (KLOR-CON) 20 MEQ tablet Take 2 tablets by mouth twice daily   Semaglutide,0.25 or 0.5MG/DOS, 2 MG/1.5ML SOPN Inject into the skin.   No facility-administered medications prior to visit.    Review of Systems  Gastrointestinal:  Positive for abdominal pain, anal bleeding, blood in stool, nausea and rectal pain.  All other systems reviewed and are negative.    Objective    BP 110/80 (BP Location: Right Wrist, Patient Position: Sitting, Cuff Size: Large)   Pulse 83   Resp 16   Ht '5\' 10"'  (1.778 m)   Wt (!) 376 lb 12.8 oz (170.9 kg)   SpO2 96%   BMI 54.07 kg/m    Physical Exam Vitals reviewed.  Constitutional:      General: He is not in acute distress.    Appearance: Normal appearance. He is not diaphoretic.  HENT:     Head: Normocephalic and atraumatic.  Eyes:     General: No scleral icterus.    Conjunctiva/sclera: Conjunctivae normal.  Cardiovascular:     Rate and Rhythm: Normal rate and regular rhythm.     Pulses: Normal pulses.     Heart sounds: Normal heart sounds. No murmur heard. Pulmonary:     Effort: Pulmonary effort is normal. No respiratory distress.     Breath sounds: Normal breath sounds. No wheezing or rhonchi.  Abdominal:     General: There is no distension.     Palpations: Abdomen is soft.     Tenderness: There is no abdominal tenderness.  Musculoskeletal:     Cervical back: Neck supple.     Right lower leg: No edema.     Left lower leg: No edema.  Lymphadenopathy:     Cervical: No cervical adenopathy.  Skin:    General: Skin is warm and dry.     Capillary Refill: Capillary refill takes less than 2 seconds.     Findings: No rash.  Neurological:     Mental Status: He is alert and oriented to person, place, and time.      Cranial Nerves: No cranial nerve deficit.  Psychiatric:        Mood and Affect: Mood normal.        Behavior: Behavior normal.      Last depression screening scores PHQ 2/9 Scores 06/13/2021 03/08/2021 01/29/2021  PHQ - 2 Score 3 0 0  PHQ- 9 Score 8 - -   Last fall risk screening Fall Risk  06/13/2021  Falls in the past year? 0  Comment -  Number falls in past yr: 0  Injury with Fall? 0  Risk for fall due to : -  Follow up -   Last Audit-C alcohol use screening Alcohol Use Disorder Test (AUDIT) 06/13/2021  1. How often do you have a drink containing alcohol? 0  2. How many drinks containing alcohol do you have on a typical day when you are drinking? 0  3. How often do you have six or more drinks on one occasion? 0  AUDIT-C Score 0   A score of 3 or more in women, and 4 or more in men indicates increased risk for alcohol abuse, EXCEPT if all of the points are from question 1   Results for orders placed or performed in visit on 06/13/21  Hemoglobin A1c  Result Value Ref Range   Hemoglobin A1C 6.2     Assessment & Plan    Routine Health Maintenance and Physical Exam  Exercise Activities and Dietary recommendations  Goals   None     Immunization History  Administered Date(s) Administered   DTaP 05/02/1991   IPV 05/02/1991   Influenza,inj,Quad PF,6+ Mos 06/13/2021   MMR 05/02/1991   PNEUMOCOCCAL CONJUGATE-20 06/13/2021    Health Maintenance  Topic Date Due   COVID-19 Vaccine (1) Never done   OPHTHALMOLOGY EXAM  Never done   URINE MICROALBUMIN  Never done   Hepatitis C Screening  Never done   TETANUS/TDAP  Never done   HEMOGLOBIN A1C  10/20/2021   FOOT EXAM  06/13/2022   Pneumococcal Vaccine 50-79 Years old  Completed   INFLUENZA VACCINE  Completed   HIV Screening  Completed   HPV VACCINES  Aged Out    Discussed health benefits of physical activity, and encouraged him to engage in regular exercise appropriate for his age and condition.  Problem List Items  Addressed This Visit       Cardiovascular and Mediastinum   Hypertension associated with diabetes ([place])    Well controlled Continue current medications Recheck metabolic panel F/u in 6 months       Relevant Orders   Comprehensive metabolic panel   Lipid panel     Digestive   Rectal bleeding    New problem Possibly hemorrhoidal bleeding, but he does describe a larger amount of blood, so agree with patient's request for referral to GI today Check CBC      Relevant Orders   Ambulatory referral to Gastroenterology   CBC     Endocrine   Diabetes mellitus ([place])    Followed by endocrinology No changes to medications Reviewed last A1c and screenings Well-controlled on last A1c of 6.2      Relevant Orders   Pneumococcal conjugate vaccine 20-valent (Completed)   Hyperlipidemia associated with type 2 diabetes mellitus ([place])    Previously well controlled Continue statin Repeat FLP and CMP Goal LDL < 70      Relevant Orders   Comprehensive metabolic panel   Lipid panel     Other   Neuropathic pain    We will take over Rxing gabapentin for next refills No longer seeing pain clinic      Morbid obesity ([place])    Discussed importance of healthy weight management Discussed diet and exercise       Other Visit Diagnoses     Encounter for annual physical exam    -  Primary   Relevant Orders   Hepatitis C antibody   Comprehensive metabolic panel   Lipid panel   CBC   Need for hepatitis C screening test       Relevant Orders   Hepatitis C antibody   BMI 50.0-59.9, adult ([place])       Need for pneumococcal vaccination       Relevant Orders   Pneumococcal conjugate vaccine 20-valent (Completed)        Return in about 6 months (around 12/11/2021) for chronic disease f/u.     I, Lavon Paganini, MD, have reviewed all documentation for this visit. The documentation on 06/13/21 for the exam, diagnosis, procedures, and orders are all accurate and  complete.   Josearmando Kuhnert, Dionne Bucy, MD, MPH [facility]

## 2021-06-13 NOTE — Assessment & Plan Note (Signed)
Well controlled Continue current medications Recheck metabolic panel F/u in 6 months  

## 2021-06-24 ENCOUNTER — Ambulatory Visit: Payer: BC Managed Care – PPO | Admitting: Occupational Therapy

## 2021-06-24 ENCOUNTER — Encounter: Payer: Self-pay | Admitting: Occupational Therapy

## 2021-08-15 ENCOUNTER — Ambulatory Visit (INDEPENDENT_AMBULATORY_CARE_PROVIDER_SITE_OTHER): Payer: BC Managed Care – PPO | Admitting: Gastroenterology

## 2021-08-15 ENCOUNTER — Encounter: Payer: Self-pay | Admitting: Gastroenterology

## 2021-08-15 ENCOUNTER — Other Ambulatory Visit: Payer: Self-pay

## 2021-08-15 VITALS — BP 136/85 | HR 87 | Temp 97.8°F | Ht 70.0 in | Wt 387.0 lb

## 2021-08-15 DIAGNOSIS — K625 Hemorrhage of anus and rectum: Secondary | ICD-10-CM | POA: Diagnosis not present

## 2021-08-15 MED ORDER — NA SULFATE-K SULFATE-MG SULF 17.5-3.13-1.6 GM/177ML PO SOLN: 354.0000 mL | Freq: Once | ORAL | 0 refills | Status: AC

## 2021-08-15 NOTE — Progress Notes (Signed)
Jonathon Bellows MD, MRCP(U.K) [address]  [place]  [place], [place] [address]  Main: [phone]  Fax: [phone]   Gastroenterology Consultation  Referring Provider:     Virginia Crews, MD Primary Care Physician:  Virginia Crews, MD Primary Gastroenterologist:  Dr. Jonathon Bellows  Reason for Consultation:     Rectal bleeding        HPI:   Collin Brown is a 36 y.o. y/o male referred for consultation & management  by Dr. Brita Romp, Dionne Bucy, MD.    Rectal bleeding :  Onset and where was blood seen  : A few months back blood on the tissue paper on and off Frequency of bowel movements : Daily Consistency : Soft Change in shape of stool: None Pain associated with bowel movements: None Blood thinner usage: None NSAID's: None Prior colonoscopy : None Family history of colon cancer or polyps: None Weight loss: None Complaints about perianal itching    Past Medical History:  Diagnosis Date   Acute blood loss anemia    Critical illness myopathy    Diabetes mellitus without complication (HCC)    Dyspnea 08/23/2020   Hearing loss in right ear    Hypertension    Pneumonia due to COVID-19 virus 05/04/2020   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 Authorizing Provider  acetaminophen (TYLENOL) 500 MG tablet Take by mouth as needed.    [provider]  ascorbic acid (VITAMIN C) 500 MG tablet Take 1 tablet (500 mg total) by mouth daily. 07/14/20   Love, Ivan Anchors, PA-C  aspirin 81 MG EC tablet Take 1 tablet by mouth daily.    [provider]  atorvastatin (LIPITOR) 20 MG tablet Take by mouth. 01/15/21 01/15/22  [provider]  Blood Glucose Monitoring Suppl (ACCU-CHEK GUIDE ME) w/Device KIT USE TO CHECK BLOOD SUGAR UP TO 4 TIMES DAILY BEFORE MEALS AS DIRECTED 11/21/20   [name], Kelby Aline, FNP  Elastic Bandages & Supports (JOBST KNEE HIGH COMPRESSION SM) MISC 1 each by Does not apply route daily. 03/19/21   Virginia Crews, MD  gabapentin (NEURONTIN) 800 MG tablet Take 1 tablet (800 mg total) by mouth 4 (four) times daily as needed. 03/08/21   Bayard Hugger, NP  Ibuprofen 200 MG CAPS Take by mouth as needed.    [provider]  Insulin Pen Needle (CAREFINE PEN NEEDLES) 32G X 6 MM MISC 1 applicator by Does not apply route 4 (four) times daily -  with meals and at bedtime. 07/13/20   Love, Ivan Anchors, PA-C  LANTUS SOLOSTAR 100 UNIT/ML Solostar Pen Inject into the skin. 07/13/20   [provider]  metFORMIN (GLUCOPHAGE-XR) 500 MG 24 hr tablet Take by mouth. 01/15/21   [provider]  metoprolol tartrate (LOPRESSOR) 50 MG tablet Take 1.5 tablets (75 mg total) by mouth 2 (two) times daily. 09/03/20   [name], Kelby Aline, FNP  Multiple Vitamin (MULTIVITAMIN) capsule Take 1 capsule by mouth daily.    [provider]  oxyCODONE (OXY IR/ROXICODONE) 5 MG immediate release tablet Take 5 mg by mouth every 4 (four) hours as  needed. 07/03/21   [provider]  potassium chloride SA (KLOR-CON) 20 MEQ tablet Take 2 tablets by mouth twice daily 10/24/20   [name], Kelby Aline, FNP  Semaglutide,0.25 or 0.5MG/DOS, 2 MG/1.5ML SOPN Inject into the skin. 01/15/21   [provider]    Family History  Problem Relation Age of Onset   Diabetes Mellitus II Mother    Hypertension Mother    Diabetes Mother    Diabetes Mellitus II Father    High blood pressure Father    Heart attack Father      Social History   Tobacco Use   Smoking status: Never   Smokeless tobacco: Never  Vaping Use   Vaping Use: Never used  Substance Use Topics   Alcohol use: No   Drug use: No     Allergies as of 08/15/2021   (No Known Allergies)    Review of Systems:    All systems reviewed and negative except where noted in HPI.   Physical Exam:  BP 136/85    Pulse 87    Temp 97.8 F (36.6 C) (Oral)    Ht 5' 10" (1.778 m)    Wt (!) 387 lb (175.5 kg)    BMI 55.53 kg/m  No LMP for male patient. Psych:  Alert and cooperative. Normal mood and affect. General:   Alert,  Well-developed, well-nourished, pleasant and cooperative in NAD Head:  Normocephalic and atraumatic. Eyes:  Sclera clear, no icterus.   Conjunctiva pink. Ears:  Normal auditory acuity. Lungs:  Respirations even and unlabored.  Clear throughout to auscultation.   No wheezes, crackles, or rhonchi. No acute distress. Heart:  Regular rate and rhythm; no murmurs, clicks, rubs, or gallops. Abdomen:  Normal bowel sounds.  No bruits.  Soft, non-tender and non-distended without masses, hepatosplenomegaly or hernias noted.  No guarding or rebound tenderness.    Neurologic:  Alert and oriented x3;  grossly normal neurologically. Psych:  Alert and cooperative. Normal mood and affect.  Imaging Studies: No results found.  Assessment and Plan:   Collin Brown is a 36 y.o. y/o male has been referred for rectal bleeding.  History suggestive of internal hemorrhoids  Plan   Colonoscopy  Discussed about conservative management of internal hemorrhoids patient information provided.  If no better at next visit we will consider banding   I have discussed alternative options, risks & benefits,  which include, but are not limited to, bleeding, infection, perforation,respiratory complication & drug reaction.  The patient agrees with this plan & written consent will be obtained.     Follow up in 6 to 8 weeks  Dr Jonathon Bellows MD,MRCP(U.K)

## 2021-08-15 NOTE — Patient Instructions (Signed)
Hemorrhoids °Hemorrhoids are swollen veins in and around the rectum or anus. There are two types of hemorrhoids: °Internal hemorrhoids. These occur in the veins that are just inside the rectum. They may poke through to the outside and become irritated and painful. °External hemorrhoids. These occur in the veins that are outside the anus and can be felt as a painful swelling or hard lump near the anus. °Most hemorrhoids do not cause serious problems, and they can be managed with home treatments such as diet and lifestyle changes. If home treatments do not help the symptoms, procedures can be done to shrink or remove the hemorrhoids. °What are the causes? °This condition is caused by increased pressure in the anal area. This pressure may result from various things, including: °Constipation. °Straining to have a bowel movement. °Diarrhea. °Pregnancy. °Obesity. °Sitting for long periods of time. °Heavy lifting or other activity that causes you to strain. °Anal sex. °Riding a bike for a long period of time. °What are the signs or symptoms? °Symptoms of this condition include: °Pain. °Anal itching or irritation. °Rectal bleeding. °Leakage of stool (feces). °Anal swelling. °One or more lumps around the anus. °How is this diagnosed? °This condition can often be diagnosed through a visual exam. Other exams or tests may also be done, such as: °An exam that involves feeling the rectal area with a gloved hand (digital rectal exam). °An exam of the anal canal that is done using a small tube (anoscope). °A blood test, if you have lost a significant amount of blood. °A test to look inside the colon using a flexible tube with a camera on the end (sigmoidoscopy or colonoscopy). °How is this treated? °This condition can usually be treated at home. However, various procedures may be done if dietary changes, lifestyle changes, and other home treatments do not help your symptoms. These procedures can help make the hemorrhoids smaller or  remove them completely. Some of these procedures involve surgery, and others do not. Common procedures include: °Rubber band ligation. Rubber bands are placed at the base of the hemorrhoids to cut off their blood supply. °Sclerotherapy. Medicine is injected into the hemorrhoids to shrink them. °Infrared coagulation. A type of light energy is used to get rid of the hemorrhoids. °Hemorrhoidectomy surgery. The hemorrhoids are surgically removed, and the veins that supply them are tied off. °Stapled hemorrhoidopexy surgery. The surgeon staples the base of the hemorrhoid to the rectal wall. °Follow these instructions at home: °Eating and drinking ° °Eat foods that have a lot of fiber in them, such as whole grains, beans, nuts, fruits, and vegetables. °Ask your health care provider about taking products that have added fiber (fiber supplements). °Reduce the amount of fat in your diet. You can do this by eating low-fat dairy products, eating less red meat, and avoiding processed foods. °Drink enough fluid to keep your urine pale yellow. °Managing pain and swelling ° °Take warm sitz baths for 20 minutes, 3-4 times a day to ease pain and discomfort. You may do this in a bathtub or using a portable sitz bath that fits over the toilet. °If directed, apply ice to the affected area. Using ice packs between sitz baths may be helpful. °Put ice in a plastic bag. °Place a towel between your skin and the bag. °Leave the ice on for 20 minutes, 2-3 times a day. °General instructions °Take over-the-counter and prescription medicines only as told by your health care provider. °Use medicated creams or suppositories as told. °Get regular exercise.   Ask your health care provider how much and what kind of exercise is best for you. In general, you should do moderate exercise for at least 30 minutes on most days of the week (150 minutes each week). This can include activities such as walking, biking, or yoga. °Go to the bathroom when you have  the urge to have a bowel movement. Do not wait. °Avoid straining to have bowel movements. °Keep the anal area dry and clean. Use wet toilet paper or moist towelettes after a bowel movement. °Do not sit on the toilet for long periods of time. This increases blood pooling and pain. °Keep all follow-up visits as told by your health care provider. This is important. °Contact a health care provider if you have: °Increasing pain and swelling that are not controlled by treatment or medicine. °Difficulty having a bowel movement, or you are unable to have a bowel movement. °Pain or inflammation outside the area of the hemorrhoids. °Get help right away if you have: °Uncontrolled bleeding from your rectum. °Summary °Hemorrhoids are swollen veins in and around the rectum or anus. °Most hemorrhoids can be managed with home treatments such as diet and lifestyle changes. °Taking warm sitz baths can help ease pain and discomfort. °In severe cases, procedures or surgery can be done to shrink or remove the hemorrhoids. °This information is not intended to replace advice given to you by your health care provider. Make sure you discuss any questions you have with your health care provider. °Document Revised: 02/06/2021 Document Reviewed: 02/06/2021 °Elsevier Patient Education © 2022 [employer] Inc[employer] ° °

## 2021-08-23 ENCOUNTER — Encounter: Payer: Self-pay | Admitting: Gastroenterology

## 2021-08-23 ENCOUNTER — Other Ambulatory Visit: Payer: Self-pay

## 2021-08-23 ENCOUNTER — Ambulatory Visit: Payer: BC Managed Care – PPO | Admitting: Anesthesiology

## 2021-08-23 ENCOUNTER — Ambulatory Visit
Admission: RE | Admit: 2021-08-23 | Discharge: 2021-08-23 | Disposition: A | Discharge status: Home / Self Care | Payer: BC Managed Care – PPO | Source: Ambulatory Visit | Attending: Gastroenterology | Admitting: Gastroenterology

## 2021-08-23 ENCOUNTER — Encounter
Admission: RE | Disposition: A | Discharge status: Home / Self Care | Payer: Self-pay | Source: Ambulatory Visit | Attending: Gastroenterology

## 2021-08-23 ENCOUNTER — Telehealth: Payer: Self-pay

## 2021-08-23 DIAGNOSIS — G4730 Sleep apnea, unspecified: Secondary | ICD-10-CM | POA: Diagnosis not present

## 2021-08-23 DIAGNOSIS — I10 Essential (primary) hypertension: Secondary | ICD-10-CM | POA: Insufficient documentation

## 2021-08-23 DIAGNOSIS — Z8616 Personal history of COVID-19: Secondary | ICD-10-CM | POA: Insufficient documentation

## 2021-08-23 DIAGNOSIS — R0601 Orthopnea: Secondary | ICD-10-CM | POA: Insufficient documentation

## 2021-08-23 DIAGNOSIS — E119 Type 2 diabetes mellitus without complications: Secondary | ICD-10-CM | POA: Insufficient documentation

## 2021-08-23 DIAGNOSIS — K625 Hemorrhage of anus and rectum: Secondary | ICD-10-CM | POA: Insufficient documentation

## 2021-08-23 HISTORY — PX: COLONOSCOPY WITH PROPOFOL: SHX5780

## 2021-08-23 LAB — GLUCOSE, CAPILLARY: Glucose-Capillary: 86 mg/dL (ref 70–99)

## 2021-08-23 SURGERY — COLONOSCOPY WITH PROPOFOL
Anesthesia: Monitor Anesthesia Care

## 2021-08-23 MED ORDER — GLYCOPYRROLATE 0.2 MG/ML IJ SOLN
INTRAMUSCULAR | Status: DC | PRN
Start: 2021-08-23 — End: 2021-08-23
  Administered 2021-08-23: .2 mg via INTRAVENOUS

## 2021-08-23 MED ORDER — LIDOCAINE HCL (CARDIAC) PF 100 MG/5ML IV SOSY
PREFILLED_SYRINGE | INTRAVENOUS | Status: DC | PRN
Start: 2021-08-23 — End: 2021-08-23
  Administered 2021-08-23: 50 mg via INTRAVENOUS

## 2021-08-23 MED ORDER — PROPOFOL 500 MG/50ML IV EMUL
INTRAVENOUS | Status: AC
  Filled 2021-08-23: qty 50

## 2021-08-23 MED ORDER — SODIUM CHLORIDE 0.9 % IV SOLN: INTRAVENOUS | Status: DC | PRN

## 2021-08-23 MED ORDER — NA SULFATE-K SULFATE-MG SULF 17.5-3.13-1.6 GM/177ML PO SOLN: 354.0000 mL | Freq: Once | ORAL | 0 refills | Status: DC

## 2021-08-23 MED ORDER — PROPOFOL 500 MG/50ML IV EMUL
INTRAVENOUS | Status: DC | PRN
  Administered 2021-08-23: 150 ug/kg/min via INTRAVENOUS

## 2021-08-23 MED ORDER — GLYCOPYRROLATE 0.2 MG/ML IJ SOLN
INTRAMUSCULAR | Status: AC
  Filled 2021-08-23: qty 4

## 2021-08-23 MED ORDER — SODIUM CHLORIDE 0.9 % IV SOLN
INTRAVENOUS | Status: DC
  Administered 2021-08-23: 1000 mL via INTRAVENOUS

## 2021-08-23 MED ORDER — LIDOCAINE HCL (PF) 2 % IJ SOLN
INTRAMUSCULAR | Status: AC
  Filled 2021-08-23: qty 20

## 2021-08-23 MED ORDER — NA SULFATE-K SULFATE-MG SULF 17.5-3.13-1.6 GM/177ML PO SOLN: 708.0000 mL | Freq: Once | ORAL | 0 refills | Status: AC

## 2021-08-23 NOTE — Transfer of Care (Signed)
Immediate Anesthesia Transfer of Care Note  Patient: Collin Brown  Procedure(s) Performed: COLONOSCOPY WITH PROPOFOL  Patient Location: PACU and Endoscopy Unit  Anesthesia Type:MAC  Level of Consciousness: awake, alert  and oriented  Airway & Oxygen Therapy:   Post-op Assessment: Report given to RN and Post -op Vital signs reviewed and stable  Post vital signs: Reviewed and stable  Last Vitals:  Vitals Value Taken Time  BP    Temp    Pulse 81 08/23/21 0946  Resp 18 08/23/21 0946  SpO2 93 % 08/23/21 0946  Vitals shown include unvalidated device data.  Last Pain:  Vitals:   08/23/21 0901  PainSc: 0-No pain         Complications: No notable events documented.

## 2021-08-23 NOTE — Anesthesia Preprocedure Evaluation (Signed)
Anesthesia Evaluation  Patient identified by MRN, date of birth, ID band Patient awake    Reviewed: Allergy & Precautions, NPO status , Patient's Chart, lab work & pertinent test results  History of Anesthesia Complications Negative for: history of anesthetic complications  Airway Mallampati: III  TM Distance: >3 FB Neck ROM: full    Dental  (+) Chipped   Pulmonary shortness of breath and with exertion, sleep apnea and Continuous Positive Airway Pressure Ventilation ,    Pulmonary exam normal        Cardiovascular hypertension, (-) angina+ Orthopnea  Normal cardiovascular exam     Neuro/Psych  Neuromuscular disease negative psych ROS   GI/Hepatic negative GI ROS, Neg liver ROS, neg GERD  ,  Endo/Other  diabetes, Type 2  Renal/GU negative Renal ROS  negative genitourinary   Musculoskeletal   Abdominal   Peds  Hematology negative hematology ROS (+)   Anesthesia Other Findings Past Medical History: No date: Acute blood loss anemia 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 No date: Diabetes mellitus without complication (HCC) 08/23/2020: Dyspnea No date: Hearing loss in right ear No date: Hypertension 05/04/2020: Pneumonia due to COVID-19 virus 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) No date: Sleep apnea  Past Surgical History: No date: TONSILLECTOMY No date: ULNAR NERVE REPAIR 06/29/2020: WOUND DEBRIDEMENT; N/A     Comment:  Procedure: DEBRIDEMENT SACRAL WOUND;  Surgeon: Axel Filler, MD;  Location: [facility];  Service: General;                Laterality: N/A;     Reproductive/Obstetrics negative OB ROS                             Anesthesia Physical Anesthesia Plan  ASA: 3  Anesthesia Plan: General   Post-op Pain Management:    Induction: Intravenous  PONV Risk Score and Plan: Propofol infusion and TIVA  Airway Management Planned: Natural Airway and Nasal  Cannula  Additional Equipment:   Intra-op Plan:   Post-operative Plan:   Informed Consent: I have reviewed the patients History and Physical, chart, labs and discussed the procedure including the risks, benefits and alternatives for the proposed anesthesia with the patient or authorized representative who has indicated his/her understanding and acceptance.     Dental Advisory Given  Plan Discussed with: Anesthesiologist, CRNA and Surgeon  Anesthesia Plan Comments: (Patient consented for risks of anesthesia including but not limited to:  - adverse reactions to medications - risk of airway placement if required - damage to eyes, teeth, lips or other oral mucosa - nerve damage due to positioning  - sore throat or hoarseness - Damage to heart, brain, nerves, lungs, other parts of body or loss of life  Patient voiced understanding.)        Anesthesia Quick Evaluation

## 2021-08-23 NOTE — H&P (Signed)
° ° ° ° , MD °[address], [address], [place], [place], [address] °[address], [address], [place], [place], [address] °Phone: [phone]  °Fax: [phone] ° °Primary Care Physician:  Bacigalupo, Angela M, MD ° ° °Pre-Procedure History & Physical: °HPI:  Collin Brown is a 36 y.o. male is here for an colonoscopy. 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 Authorizing Provider  °acetaminophen (TYLENOL) 500 MG tablet Take by mouth as needed.   Yes [provider]  °ascorbic acid (VITAMIN C) 500 MG tablet Take 1 tablet (500 mg total) by mouth daily. 07/14/20  Yes Love, Pamela S, PA-C  °aspirin 81 MG EC tablet Take 1 tablet by mouth daily.   Yes [provider]  °atorvastatin (LIPITOR) 20 MG tablet Take by mouth. 01/15/21 01/15/22 Yes [provider]  °Blood Glucose Monitoring Suppl (ACCU-CHEK GUIDE ME) w/Device KIT USE TO CHECK BLOOD SUGAR UP TO 4 TIMES DAILY BEFORE MEALS AS DIRECTED 11/21/20  Yes [name], [name] S, FNP  °Elastic Bandages & Supports (JOBST KNEE HIGH COMPRESSION SM) MISC 1 each by Does not apply route daily. 03/19/21  Yes Bacigalupo, Angela M, MD  °gabapentin (NEURONTIN) 800 MG tablet Take 1 tablet (800 mg total) by mouth 4 (four) times daily as needed. 03/08/21  Yes Thomas, Eunice L, NP  °Ibuprofen 200 MG CAPS Take by mouth as needed.   Yes [provider]  °Insulin Pen Needle (CAREFINE PEN NEEDLES) 32G X 6 MM MISC 1 applicator by Does not apply route 4 (four) times daily -  with meals and at bedtime. 07/13/20  Yes Love, Pamela S, PA-C  °metFORMIN (GLUCOPHAGE-XR) 500 MG 24 hr tablet Take by mouth. 01/15/21  Yes [provider]  °metoprolol tartrate (LOPRESSOR) 50 MG tablet Take 1.5 tablets (75 mg total) by mouth 2 (two) times daily. 09/03/20  Yes [name], [name] S, FNP  °Multiple Vitamin (MULTIVITAMIN) capsule Take 1 capsule by mouth daily.   Yes [provider]  °Semaglutide,0.25 or 0.5MG/DOS, 2 MG/1.5ML SOPN Inject into the skin. 01/15/21  Yes [provider]  °LANTUS SOLOSTAR 100 UNIT/ML Solostar Pen Inject into the skin. °Patient not taking: Reported on 08/23/2021 07/13/20   [provider]  °oxyCODONE (OXY IR/ROXICODONE) 5 MG immediate release tablet Take 5 mg by mouth every 4 (four) hours as needed. °Patient not taking: Reported on 08/23/2021 07/03/21   [provider]  ° ° °Allergies as of 08/15/2021  ° (No Known Allergies)  ° ° °Family History  °Problem Relation Age of Onset  ° Diabetes Mellitus II Mother   ° Hypertension Mother   ° Diabetes Mother   ° Diabetes Mellitus II Father   ° High blood pressure Father   ° Heart attack Father   ° ° °Social History  ° °  Socioeconomic History  ° Marital status: Single  °  Spouse name: Not on file  ° Number of children: Not on file  ° Years of education: Not on file  ° Highest education level: Not on file  °Occupational History  ° Occupation: [employer] manager  °Tobacco Use  ° Smoking status: Never  ° Smokeless tobacco: Never  °Vaping Use  ° Vaping Use: Never used  °Substance and Sexual Activity  ° Alcohol use: No  ° Drug use: No  ° Sexual activity: Yes  °  Birth control/protection: None  °Other Topics Concern  ° Not on file  °Social History Narrative  ° Not on file  ° °Social Determinants of Health  ° °Financial Resource Strain: Not on file  °Food Insecurity: Not on file   °Transportation Needs: Not on file  °Physical Activity: Not on file  °Stress: Not on file  °Social Connections: Not on file  °Intimate Partner Violence: Not on file  ° ° °Review of Systems: °See HPI, otherwise negative ROS ° °Physical Exam: °There were no vitals taken for this visit. °General:   Alert,  pleasant and cooperative in NAD °Head:  Normocephalic and atraumatic. °Neck:  Supple; no masses or thyromegaly. °Lungs:  Clear throughout to auscultation, normal respiratory effort.    °Heart:  +S1, +S2, Regular rate and rhythm, No edema. °Abdomen:  Soft, nontender and nondistended. Normal bowel sounds, without guarding, and without rebound.   °Neurologic:  Alert and  oriented x4;  grossly normal neurologically. ° °Impression/Plan: °Collin Brown is here for an colonoscopy to be performed for rectal bleeding. Risks, benefits, limitations, and alternatives regarding  colonoscopy have been reviewed with the patient.  Questions have been answered.  All parties agreeable. ° ° ° , MD  08/23/2021, 9:01 AM ° °

## 2021-08-23 NOTE — Op Note (Signed)
[facility] Gastroenterology Patient Name: Collin Brown Procedure Date: 08/23/2021 8:54 AM MRN: [id number] Account #: [id number] Date of Birth: 01/06/1986 Admit Type: Outpatient Age: 36 Room: [facility] ENDO ROOM 4 Gender: Male Note Status: Finalized Instrument Name: Jasper Riling [id number] Procedure:             Colonoscopy Indications:           Rectal bleeding Providers:             Jonathon Bellows MD, MD Referring MD:          Dionne Bucy. Bacigalupo (Referring MD) Medicines:             Monitored Anesthesia Care Complications:         No immediate complications. Procedure:             Pre-Anesthesia Assessment:                        - Prior to the procedure, a History and Physical was                         performed, and patient medications, allergies and                         sensitivities were reviewed. The patient's tolerance                         of previous anesthesia was reviewed.                        - The risks and benefits of the procedure and the                         sedation options and risks were discussed with the                         patient. All questions were answered and informed                         consent was obtained.                        - ASA Grade Assessment: II - A patient with mild                         systemic disease.                        After obtaining informed consent, the colonoscope was                         passed under direct vision. Throughout the procedure,                         the patient's blood pressure, pulse, and oxygen                         saturations were monitored continuously. The                         Colonoscope was introduced through the anus  with the                         intention of advancing to the cecum. The scope was                         advanced to the descending colon before the procedure                         was aborted. Medications were given. The colonoscopy                          was performed with ease. The patient tolerated the                         procedure well. The quality of the bowel preparation                         was inadequate. Findings:      The perianal and digital rectal examinations were normal.      A large amount of semi-solid stool was found in the rectum and in the       sigmoid colon, interfering with visualization. Impression:            - Preparation of the colon was inadequate.                        - Stool in the rectum and in the sigmoid colon.                        - No specimens collected. Recommendation:        - Discharge patient to home (with escort).                        - Resume previous diet.                        - Continue present medications.                        - - Repeat colonoscopy on monday because the bowel                         preparation was suboptimal. Procedure Code(s):     --- Professional ---                        [id number], 53, Colonoscopy, flexible; diagnostic,                         including collection of specimen(s) by brushing or                         washing, when performed (separate procedure) Diagnosis Code(s):     --- Professional ---                        K62.5, Hemorrhage of anus and rectum CPT copyright 2019 American Medical Association. All rights reserved. The codes documented in this report are preliminary and upon coder review may  be revised  to meet current compliance requirements. Jonathon Bellows, MD Jonathon Bellows MD, MD 08/23/2021 9:41:29 AM This report has been signed electronically. Number of Addenda: 0 Note Initiated On: 08/23/2021 8:54 AM Total Procedure Duration: 0 hours 1 minute 23 seconds  Estimated Blood Loss:  Estimated blood loss: none.      [facility]

## 2021-08-23 NOTE — Telephone Encounter (Signed)
Called patient but had to leave him a detailed message letting him know that his procedure will be on 08/27/2021 as told today when he was at the endo unit this morning. I also let him know that Dr. Tobi Bastos wanted him to do a 2 day prep and that his new prescription of Suprep x 2 was sent to his pharmacy.

## 2021-08-23 NOTE — Anesthesia Postprocedure Evaluation (Signed)
Anesthesia Post Note  Patient: Collin Brown  Procedure(s) Performed: COLONOSCOPY WITH PROPOFOL  Patient location during evaluation: Endoscopy Anesthesia Type: General Level of consciousness: awake and alert Pain management: pain level controlled Vital Signs Assessment: post-procedure vital signs reviewed and stable Respiratory status: spontaneous breathing, nonlabored ventilation, respiratory function stable and patient connected to nasal cannula oxygen Cardiovascular status: blood pressure returned to baseline and stable Postop Assessment: no apparent nausea or vomiting Anesthetic complications: no   No notable events documented.   Last Vitals:  Vitals:   08/23/21 0946 08/23/21 0956  BP: 122/72 131/87  Pulse: 81 75  Resp: 18 (!) 25  Temp: 36.4 C   SpO2: 93% 90%    Last Pain:  Vitals:   08/23/21 0956  TempSrc:   PainSc: 0-No pain                 Precious Haws Wynell Halberg

## 2021-08-23 NOTE — Addendum Note (Signed)
Addended by: Wayna Chalet on: 08/23/2021 10:46 AM   Modules accepted: Orders

## 2021-09-09 ENCOUNTER — Encounter: Payer: Self-pay | Admitting: Gastroenterology

## 2021-09-10 ENCOUNTER — Ambulatory Visit
Admission: RE | Admit: 2021-09-10 | Discharge: 2021-09-10 | Disposition: A | Discharge status: Home / Self Care | Payer: BC Managed Care – PPO | Source: Ambulatory Visit | Attending: Gastroenterology | Admitting: Gastroenterology

## 2021-09-10 ENCOUNTER — Ambulatory Visit: Payer: BC Managed Care – PPO | Admitting: Anesthesiology

## 2021-09-10 ENCOUNTER — Encounter
Admission: RE | Disposition: A | Discharge status: Home / Self Care | Payer: Self-pay | Source: Ambulatory Visit | Attending: Gastroenterology

## 2021-09-10 ENCOUNTER — Encounter: Payer: Self-pay | Admitting: Gastroenterology

## 2021-09-10 DIAGNOSIS — G4730 Sleep apnea, unspecified: Secondary | ICD-10-CM | POA: Diagnosis not present

## 2021-09-10 DIAGNOSIS — K625 Hemorrhage of anus and rectum: Secondary | ICD-10-CM

## 2021-09-10 DIAGNOSIS — K640 First degree hemorrhoids: Secondary | ICD-10-CM | POA: Insufficient documentation

## 2021-09-10 DIAGNOSIS — E119 Type 2 diabetes mellitus without complications: Secondary | ICD-10-CM | POA: Diagnosis not present

## 2021-09-10 HISTORY — PX: COLONOSCOPY WITH PROPOFOL: SHX5780

## 2021-09-10 HISTORY — DX: Morbid (severe) obesity due to excess calories: E66.01

## 2021-09-10 SURGERY — COLONOSCOPY WITH PROPOFOL
Anesthesia: General

## 2021-09-10 MED ORDER — LIDOCAINE HCL (PF) 1 % IJ SOLN
INTRAMUSCULAR | Status: AC
  Filled 2021-09-10: qty 2

## 2021-09-10 MED ORDER — LIDOCAINE 2% (20 MG/ML) 5 ML SYRINGE
INTRAMUSCULAR | Status: DC | PRN
Start: 2021-09-10 — End: 2021-09-10
  Administered 2021-09-10: 50 mg via INTRAVENOUS

## 2021-09-10 MED ORDER — PROPOFOL 500 MG/50ML IV EMUL
INTRAVENOUS | Status: DC | PRN
  Administered 2021-09-10: 200 ug/kg/min via INTRAVENOUS

## 2021-09-10 MED ORDER — PROPOFOL 10 MG/ML IV BOLUS
INTRAVENOUS | Status: DC | PRN
  Administered 2021-09-10: 50 mg via INTRAVENOUS
  Administered 2021-09-10: 100 mg via INTRAVENOUS

## 2021-09-10 MED ORDER — SODIUM CHLORIDE 0.9 % IV SOLN: INTRAVENOUS | Status: DC

## 2021-09-10 NOTE — Anesthesia Preprocedure Evaluation (Addendum)
Anesthesia Evaluation  Patient identified by MRN, date of birth, ID band Patient awake    Reviewed: Allergy & Precautions, NPO status , Patient's Chart, lab work & pertinent test results  History of Anesthesia Complications Negative for: history of anesthetic complications  Airway Mallampati: III  TM Distance: >3 FB Neck ROM: full    Dental  (+) Chipped   Pulmonary shortness of breath and with exertion, sleep apnea and Continuous Positive Airway Pressure Ventilation ,    Pulmonary exam normal        Cardiovascular hypertension, (-) angina+ Orthopnea  Normal cardiovascular exam     Neuro/Psych  Neuromuscular disease negative psych ROS   GI/Hepatic negative GI ROS, Neg liver ROS, neg GERD  ,  Endo/Other  diabetes, Type 2  Renal/GU negative Renal ROS  negative genitourinary   Musculoskeletal   Abdominal   Peds  Hematology negative hematology ROS (+)   Anesthesia Other Findings BMI    Body Mass Index: 55.67 kg/m   Past Medical History: No date: Acute blood loss anemia 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 No date: Diabetes mellitus without complication (HCC) 08/23/2020: Dyspnea No date: Hearing loss in right ear No date: Hypertension 05/04/2020: Pneumonia due to COVID-19 virus 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) No date: Sleep apnea  Past Surgical History: No date: TONSILLECTOMY No date: ULNAR NERVE REPAIR 06/29/2020: WOUND DEBRIDEMENT; N/A     Comment:  Procedure: DEBRIDEMENT SACRAL WOUND;  Surgeon: Axel Filler, MD;  Location: [facility] OR;  Service: General;                Laterality: N/A;     Reproductive/Obstetrics negative OB ROS                            Anesthesia Physical  Anesthesia Plan  ASA: 3  Anesthesia Plan: General   Post-op Pain Management:    Induction: Intravenous  PONV Risk Score and Plan: Propofol infusion and TIVA  Airway Management  Planned: Natural Airway and Nasal Cannula  Additional Equipment:   Intra-op Plan:   Post-operative Plan:   Informed Consent: I have reviewed the patients History and Physical, chart, labs and discussed the procedure including the risks, benefits and alternatives for the proposed anesthesia with the patient or authorized representative who has indicated his/her understanding and acceptance.     Dental Advisory Given  Plan Discussed with: Anesthesiologist, CRNA and Surgeon  Anesthesia Plan Comments: (Patient consented for risks of anesthesia including but not limited to:  - adverse reactions to medications - risk of airway placement if required - damage to eyes, teeth, lips or other oral mucosa - nerve damage due to positioning  - sore throat or hoarseness - Damage to heart, brain, nerves, lungs, other parts of body or loss of life  Patient voiced understanding.)        Anesthesia Quick Evaluation

## 2021-09-10 NOTE — H&P (Signed)
° ° ° ° , MD °[address], [address], [place], [place], [address] °[address], [address], [place], [place], [address] °Phone: [phone]  °Fax: [phone] ° °Primary Care Physician:  Bacigalupo, Angela M, MD ° ° °Pre-Procedure History & Physical: °HPI:  Collin Brown is a 35 y.o. male is here for an colonoscopy. °  °Past Medical History:  °Diagnosis Date  ° Acute blood loss anemia   ° Critical illness myopathy   ° Diabetes mellitus without complication (HCC)   ° Dyspnea 08/23/2020  ° Hearing loss in right ear   ° Hypertension   ° Pneumonia due to COVID-19 virus 05/04/2020  ° 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  ° Sleep apnea   ° ° °Past Surgical History:  °Procedure Laterality Date  ° COLONOSCOPY WITH PROPOFOL N/A 08/23/2021  ° Procedure: COLONOSCOPY WITH PROPOFOL;  Surgeon: , , MD;  Location: [facility] ENDOSCOPY;  Service: Gastroenterology;  Laterality: N/A;  ° TONSILLECTOMY    ° ULNAR NERVE REPAIR    ° WOUND DEBRIDEMENT N/A 06/29/2020  ° Procedure: DEBRIDEMENT SACRAL WOUND;  Surgeon: Ramirez, Armando, MD;  Location: [facility] OR;  Service: General;  Laterality: N/A;  ° ° °Prior to Admission medications   °Medication Sig Start Date End Date Taking? Authorizing Provider  °gabapentin (NEURONTIN) 800 MG tablet Take 1 tablet (800 mg total) by mouth 4 (four) times daily as needed. 03/08/21  Yes Thomas, Eunice L, NP  °metFORMIN (GLUCOPHAGE-XR) 500 MG 24 hr tablet Take by mouth. 01/15/21  Yes [provider]  °metoprolol tartrate (LOPRESSOR) 50 MG tablet Take 1.5 tablets (75 mg total) by mouth 2 (two) times daily. 09/03/20  Yes [name], [name] S, FNP  °acetaminophen (TYLENOL) 500 MG tablet Take by mouth as needed.    [provider]  °ascorbic acid (VITAMIN C) 500 MG tablet Take 1 tablet (500 mg total) by mouth daily. 07/14/20   Love, Pamela S, PA-C  °aspirin 81 MG EC tablet Take 1 tablet by mouth daily.    [provider]  °atorvastatin (LIPITOR) 20 MG tablet Take by mouth. 01/15/21  01/15/22  [provider]  °Blood Glucose Monitoring Suppl (ACCU-CHEK GUIDE ME) w/Device KIT USE TO CHECK BLOOD SUGAR UP TO 4 TIMES DAILY BEFORE MEALS AS DIRECTED 11/21/20   [name], [name] S, FNP  °Elastic Bandages & Supports (JOBST KNEE HIGH COMPRESSION SM) MISC 1 each by Does not apply route daily. 03/19/21   Bacigalupo, Angela M, MD  °Ibuprofen 200 MG CAPS Take by mouth as needed.    [provider]  °Insulin Pen Needle (CAREFINE PEN NEEDLES) 32G X 6 MM MISC 1 applicator by Does not apply route 4 (four) times daily -  with meals and at bedtime. 07/13/20   Love, Pamela S, PA-C  °LANTUS SOLOSTAR 100 UNIT/ML Solostar Pen Inject into the skin. °Patient not taking: Reported on 08/23/2021 07/13/20   [provider]  °Multiple Vitamin (MULTIVITAMIN) capsule Take 1 capsule by mouth daily.    [provider]  °oxyCODONE (OXY IR/ROXICODONE) 5 MG immediate release tablet Take 5 mg by mouth every 4 (four) hours as needed. °Patient not taking: Reported on 08/23/2021 07/03/21   [provider]  °Semaglutide,0.25 or 0.5MG/DOS, 2 MG/1.5ML SOPN Inject into the skin. 01/15/21   [provider]  ° ° °Allergies as of 08/23/2021  ° (No Known Allergies)  ° ° °Family History  °Problem Relation Age of Onset  ° Diabetes Mellitus II Mother   ° Hypertension Mother   ° Diabetes Mother   °   Mother    Diabetes Mellitus II Father    High blood pressure Father    Heart attack Father     Social History   Socioeconomic History   Marital status: Single    Spouse name: Not on file   Number of children: Not on file   Years of education: Not on file   Highest education level: Not on file  Occupational History   Occupation: Herbalist  Tobacco Use   Smoking status: Never   Smokeless tobacco: Never  Vaping Use   Vaping Use: Never used  Substance and Sexual Activity   Alcohol use: No   Drug use: No   Sexual activity: Yes    Birth control/protection: None  Other Topics Concern   Not on  file  Social History Narrative   Not on file   Social Determinants of Health   Financial Resource Strain: Not on file  Food Insecurity: Not on file  Transportation Needs: Not on file  Physical Activity: Not on file  Stress: Not on file  Social Connections: Not on file  Intimate Partner Violence: Not on file    Review of Systems: See HPI, otherwise negative ROS  Physical Exam: BP (!) 145/99    Pulse 82    Temp (!) 97.4 F (36.3 C) (Temporal)    Resp 20    Ht 5' 10" (1.778 m)    Wt (!) 176 kg    SpO2 97%    BMI 55.67 kg/m  General:   Alert,  pleasant and cooperative in NAD Head:  Normocephalic and atraumatic. Neck:  Supple; no masses or thyromegaly. Lungs:  Clear throughout to auscultation, normal respiratory effort.    Heart:  +S1, +S2, Regular rate and rhythm, No edema. Abdomen:  Soft, nontender and nondistended. Normal bowel sounds, without guarding, and without rebound.   Neurologic:  Alert and  oriented x4;  grossly normal neurologically.  Impression/Plan: Collin Brown is here for an colonoscopy to be performed for blood in stool. Risks, benefits, limitations, and alternatives regarding  colonoscopy have been reviewed with the patient.  Questions have been answered.  All parties agreeable.   Jonathon Bellows, MD  09/10/2021, 9:48 AM

## 2021-09-10 NOTE — Transfer of Care (Signed)
Immediate Anesthesia Transfer of Care Note  Patient: Collin Brown  Procedure(s) Performed: COLONOSCOPY WITH PROPOFOL  Patient Location: Endoscopy Unit  Anesthesia Type:General  Level of Consciousness: awake  Airway & Oxygen Therapy: Patient Spontanous Breathing  Post-op Assessment: Report given to RN and Post -op Vital signs reviewed and stable  Post vital signs: Reviewed and stable  Last Vitals:  Vitals Value Taken Time  BP 109/62 09/10/21 1022  Temp    Pulse 101 09/10/21 1023  Resp 17 09/10/21 1023  SpO2 97 % 09/10/21 1023  Vitals shown include unvalidated device data.  Last Pain:  Vitals:   09/10/21 0901  TempSrc: Temporal         Complications: No notable events documented.

## 2021-09-10 NOTE — Anesthesia Postprocedure Evaluation (Signed)
Anesthesia Post Note  Patient: Collin Brown  Procedure(s) Performed: COLONOSCOPY WITH PROPOFOL  Patient location during evaluation: Endoscopy Anesthesia Type: General Level of consciousness: combative Pain management: pain level controlled Vital Signs Assessment: post-procedure vital signs reviewed and stable Respiratory status: spontaneous breathing, nonlabored ventilation, respiratory function stable and patient connected to nasal cannula oxygen Cardiovascular status: blood pressure returned to baseline and stable Postop Assessment: no apparent nausea or vomiting Anesthetic complications: no   No notable events documented.   Last Vitals:  Vitals:   09/10/21 1030 09/10/21 1040  BP: 109/62 120/85  Pulse: 94 89  Resp: (!) 28 (!) 23  Temp:    SpO2: 93% 95%    Last Pain:  Vitals:   09/10/21 0901  TempSrc: Temporal                 Precious Haws Baer Hinton

## 2021-09-10 NOTE — Op Note (Signed)
[facility] Patient Name: Collin Brown Procedure Date: 09/10/2021 10:01 AM MRN: [id number] Account #: [id number] Date of Birth: [birth year] Admit Type: Outpatient Age: 36 Room: [facility] ENDO ROOM 2 Gender: Male Note Status: Finalized Instrument Name: Jasper Riling [id number] Procedure:             Colonoscopy Indications:           Rectal bleeding Providers:             Jonathon Bellows MD, MD Referring MD:          Dionne Bucy. Bacigalupo (Referring MD) Medicines:             Monitored Anesthesia Care Complications:         No immediate complications. Procedure:             Pre-Anesthesia Assessment:                        - Prior to the procedure, a History and Physical was                         performed, and patient medications, allergies and                         sensitivities were reviewed. The patient's tolerance                         of previous anesthesia was reviewed.                        - The risks and benefits of the procedure and the                         sedation options and risks were discussed with the                         patient. All questions were answered and informed                         consent was obtained.                        - ASA Grade Assessment: II - A patient with mild                         systemic disease.                        After obtaining informed consent, the colonoscope was                         passed under direct vision. Throughout the procedure,                         the patient's blood pressure, pulse, and oxygen                         saturations were monitored continuously. The                         Colonoscope was introduced through the anus  and                         advanced to the the cecum, identified by the                         appendiceal orifice. The colonoscopy was performed                         with ease. The patient tolerated the procedure well.                         The  quality of the bowel preparation was good. Findings:      The perianal and digital rectal examinations were normal.      Non-bleeding internal hemorrhoids were found during retroflexion. The       hemorrhoids were small and Grade I (internal hemorrhoids that do not       prolapse).      The exam was otherwise without abnormality on direct and retroflexion       views. Impression:            - Non-bleeding internal hemorrhoids.                        - The examination was otherwise normal on direct and                         retroflexion views.                        - No specimens collected. Recommendation:        - Discharge patient to home (with escort).                        - Resume previous diet.                        - Continue present medications.                        - Return to my office as previously scheduled. Procedure Code(s):     --- Professional ---                        [phone], Colonoscopy, flexible; diagnostic, including                         collection of specimen(s) by brushing or washing, when                         performed (separate procedure) Diagnosis Code(s):     --- Professional ---                        K64.0, First degree hemorrhoids                        K62.5, Hemorrhage of anus and rectum CPT copyright 2019 American Medical Association. All rights reserved. The codes documented in this report are preliminary and upon coder review may  be revised to meet current compliance requirements. Jonathon Bellows, MD Jonathon Bellows MD, MD  09/10/2021 10:17:58 AM This report has been signed electronically. Number of Addenda: 0 Note Initiated On: 09/10/2021 10:01 AM Scope Withdrawal Time: 0 hours 7 minutes 23 seconds  Total Procedure Duration: 0 hours 9 minutes 8 seconds  Estimated Blood Loss:  Estimated blood loss: none.      [facility]

## 2021-09-11 ENCOUNTER — Encounter: Payer: Self-pay | Admitting: Gastroenterology

## 2021-09-16 ENCOUNTER — Encounter: Payer: Self-pay | Admitting: Gastroenterology

## 2021-09-16 ENCOUNTER — Other Ambulatory Visit: Payer: Self-pay

## 2021-09-16 ENCOUNTER — Ambulatory Visit (INDEPENDENT_AMBULATORY_CARE_PROVIDER_SITE_OTHER): Payer: BC Managed Care – PPO | Admitting: Gastroenterology

## 2021-09-16 VITALS — BP 141/87 | HR 75 | Temp 97.8°F | Ht 70.0 in | Wt 384.0 lb

## 2021-09-16 DIAGNOSIS — K648 Other hemorrhoids: Secondary | ICD-10-CM | POA: Diagnosis not present

## 2021-09-16 DIAGNOSIS — K625 Hemorrhage of anus and rectum: Secondary | ICD-10-CM | POA: Diagnosis not present

## 2021-09-16 NOTE — Patient Instructions (Addendum)
Please look into the bidet on [employer]. If this doesn't work for you, please let us know.  Hemorrhoids Hemorrhoids are swollen veins in and around the rectum or anus. There are two types of hemorrhoids: Internal hemorrhoids. These occur in the veins that are just inside the rectum. They may poke through to the outside and become irritated and painful. External hemorrhoids. These occur in the veins that are outside the anus and can be felt as a painful swelling or hard lump near the anus. Most hemorrhoids do not cause serious problems, and they can be managed with home treatments such as diet and lifestyle changes. If home treatments do not help the symptoms, procedures can be done to shrink or remove the hemorrhoids. What are the causes? This condition is caused by increased pressure in the anal area. This pressure may result from various things, including: Constipation. Straining to have a bowel movement. Diarrhea. Pregnancy. Obesity. Sitting for long periods of time. Heavy lifting or other activity that causes you to strain. Anal sex. Riding a bike for a long period of time. What are the signs or symptoms? Symptoms of this condition include: Pain. Anal itching or irritation. Rectal bleeding. Leakage of stool (feces). Anal swelling. One or more lumps around the anus. How is this diagnosed? This condition can often be diagnosed through a visual exam. Other exams or tests may also be done, such as: An exam that involves feeling the rectal area with a gloved hand (digital rectal exam). An exam of the anal canal that is done using a small tube (anoscope). A blood test, if you have lost a significant amount of blood. A test to look inside the colon using a flexible tube with a camera on the end (sigmoidoscopy or colonoscopy). How is this treated? This condition can usually be treated at home. However, various procedures may be done if dietary changes, lifestyle changes, and other home  treatments do not help your symptoms. These procedures can help make the hemorrhoids smaller or remove them completely. Some of these procedures involve surgery, and others do not. Common procedures include: Rubber band ligation. Rubber bands are placed at the base of the hemorrhoids to cut off their blood supply. Sclerotherapy. Medicine is injected into the hemorrhoids to shrink them. Infrared coagulation. A type of light energy is used to get rid of the hemorrhoids. Hemorrhoidectomy surgery. The hemorrhoids are surgically removed, and the veins that supply them are tied off. Stapled hemorrhoidopexy surgery. The surgeon staples the base of the hemorrhoid to the rectal wall. Follow these instructions at home: Eating and drinking  Eat foods that have a lot of fiber in them, such as whole grains, beans, nuts, fruits, and vegetables. Ask your health care provider about taking products that have added fiber (fiber supplements). Reduce the amount of fat in your diet. You can do this by eating low-fat dairy products, eating less red meat, and avoiding processed foods. Drink enough fluid to keep your urine pale yellow. Managing pain and swelling  Take warm sitz baths for 20 minutes, 3-4 times a day to ease pain and discomfort. You may do this in a bathtub or using a portable sitz bath that fits over the toilet. If directed, apply ice to the affected area. Using ice packs between sitz baths may be helpful. Put ice in a plastic bag. Place a towel between your skin and the bag. Leave the ice on for 20 minutes, 2-3 times a day. General instructions Take over-the-counter and prescription medicines  only as told by your health care provider. Use medicated creams or suppositories as told. Get regular exercise. Ask your health care provider how much and what kind of exercise is best for you. In general, you should do moderate exercise for at least 30 minutes on most days of the week (150 minutes each week).  This can include activities such as walking, biking, or yoga. Go to the bathroom when you have the urge to have a bowel movement. Do not wait. Avoid straining to have bowel movements. Keep the anal area dry and clean. Use wet toilet paper or moist towelettes after a bowel movement. Do not sit on the toilet for long periods of time. This increases blood pooling and pain. Keep all follow-up visits as told by your health care provider. This is important. Contact a health care provider if you have: Increasing pain and swelling that are not controlled by treatment or medicine. Difficulty having a bowel movement, or you are unable to have a bowel movement. Pain or inflammation outside the area of the hemorrhoids. Get help right away if you have: Uncontrolled bleeding from your rectum. Summary Hemorrhoids are swollen veins in and around the rectum or anus. Most hemorrhoids can be managed with home treatments such as diet and lifestyle changes. Taking warm sitz baths can help ease pain and discomfort. In severe cases, procedures or surgery can be done to shrink or remove the hemorrhoids. This information is not intended to replace advice given to you by your health care provider. Make sure you discuss any questions you have with your health care provider. Document Revised: 02/06/2021 Document Reviewed: 02/06/2021 Elsevier Patient Education  2022 [employer].

## 2021-09-16 NOTE — Progress Notes (Signed)
Jonathon Bellows MD, MRCP(U.K) [address]  [place]  [place], [place] [address]  Main: [phone]  Fax: [phone]   Primary Care Physician: Virginia Crews, MD  Primary Gastroenterologist:  Dr. Jonathon Bellows   Chief Complaint  Patient presents with   Rectal Bleeding    HPI: Collin Brown is a 36 y.o. male   Summary of history :  He was initially referred on 08/15/2021 for rectal bleeding going on for a few months with blood on the tissue paper on and off.  I performed a colonoscopy on 09/10/2021 Showed nonbleeding internal hemorrhoids otherwise evaluation was normal   Interval history   Since his procedure he is not having any more rectal bleeding.  We discussed previously about perianal hygiene and cleaning after bowel movement and he has been following the same.  Current Outpatient Medications  Medication Sig Dispense Refill   acetaminophen (TYLENOL) 500 MG tablet Take by mouth as needed.     ascorbic acid (VITAMIN C) 500 MG tablet Take 1 tablet (500 mg total) by mouth daily. 30 tablet 0   aspirin 81 MG EC tablet Take 1 tablet by mouth daily.     atorvastatin (LIPITOR) 20 MG tablet Take by mouth.     Blood Glucose Monitoring Suppl (ACCU-CHEK GUIDE ME) w/Device KIT USE TO CHECK BLOOD SUGAR UP TO 4 TIMES DAILY BEFORE MEALS AS DIRECTED 1 kit 0   Elastic Bandages & Supports (JOBST KNEE HIGH COMPRESSION SM) MISC 1 each by Does not apply route daily. 1 each 0   gabapentin (NEURONTIN) 800 MG tablet Take 1 tablet (800 mg total) by mouth 4 (four) times daily as needed. 120 tablet 3   Ibuprofen 200 MG CAPS Take by mouth as needed.     metFORMIN (GLUCOPHAGE-XR) 500 MG 24 hr tablet Take by mouth.     metoprolol tartrate (LOPRESSOR) 50 MG tablet Take 1.5 tablets (75 mg total) by mouth 2 (two) times daily. 180 tablet 0   Multiple Vitamin (MULTIVITAMIN) capsule Take 1 capsule by mouth daily.     Semaglutide,0.25 or 0.5MG/DOS, 2 MG/1.5ML SOPN Inject into the skin.     No  current facility-administered medications for this visit.    Allergies as of 09/16/2021   (No Known Allergies)    ROS:  General: Negative for anorexia, weight loss, fever, chills, fatigue, weakness. ENT: Negative for hoarseness, difficulty swallowing , nasal congestion. CV: Negative for chest pain, angina, palpitations, dyspnea on exertion, peripheral edema.  Respiratory: Negative for dyspnea at rest, dyspnea on exertion, cough, sputum, wheezing.  GI: See history of present illness. GU:  Negative for dysuria, hematuria, urinary incontinence, urinary frequency, nocturnal urination.  Endo: Negative for unusual weight change.    Physical Examination:   BP (!) 141/87    Pulse 75    Temp 97.8 F (36.6 C) (Oral)    Ht '5\' 10"'  (1.778 m)    Wt (!) 384 lb (174.2 kg)    BMI 55.10 kg/m   General: Well-nourished, well-developed in no acute distress.  Eyes: No icterus. Conjunctivae pink. Neuro: Alert and oriented x 3.  Grossly intact. Skin: Warm and dry, no jaundice.   Psych: Alert and cooperative, normal mood and affect.   Imaging Studies: No results found.  Assessment and Plan:   Collin Brown is a 36 y.o. y/o male here to follow-up for rectal bleeding secondary to internal hemorrhoids.  We had previously discussed about symptomatic management which seems to have worked.  He  has had difficulty cleaning himself after bowel movement and we discussed about installing a bidet .  I have given him pictures of the same to be purchased from the Internet to help him clean up after bowel movement and prevent excess excoriation from aggressive cleaning with tissue paper.  He is aware that if his symptoms recur to come back and see me    Dr Jonathon Bellows  MD,MRCP [facility]) Follow up in as needed

## 2021-11-25 ENCOUNTER — Ambulatory Visit: Payer: BC Managed Care – PPO | Admitting: Family Medicine

## 2021-11-25 ENCOUNTER — Encounter: Payer: Self-pay | Admitting: Family Medicine

## 2021-11-25 NOTE — Patient Instructions (Incomplete)
Please continue using your CPAP regularly. While your insurance requires that you use CPAP at least 4 hours each night on 70% of the nights, I recommend, that you not skip any nights and use it throughout the night if you can. Getting used to CPAP and staying with the treatment long term does take time and patience and discipline. Untreated obstructive sleep apnea when it is moderate to severe can have an adverse impact on cardiovascular health and raise her risk for heart disease, arrhythmias, hypertension, congestive heart failure, stroke and diabetes. Untreated obstructive sleep apnea causes sleep disruption, nonrestorative sleep, and sleep deprivation. This can have an impact on your day to day functioning and cause daytime sleepiness and impairment of cognitive function, memory loss, mood disturbance, and problems focussing. Using CPAP regularly can improve these symptoms.  Follow up in  

## 2021-11-25 NOTE — Progress Notes (Deleted)
? ? ?No chief complaint on file. ? ? ? ?HISTORY OF PRESENT ILLNESS: ? ?11/25/21 ALL:  ?Collin Brown returns for follow up for OSA on CPAP.  ? ?05/23/2021 ALL:  ?Collin Brown is a 36 y.o. male here today for follow up for OSA on CPAP. Split night study 09/2020 showed severe OSA with AHI of 50.4/hr and O2 nadir of 81%. CPAP ordered with set pressure of 7cmH20. He reports doing very well on CPAP therapy. He is using CPAP every night for at least 4 hours. He feels much better rested and more alert during the day. He is working on healthy lifestyle changes for weight management.  ? ?Compliance report dated 04/23/2021-05/22/2021 shows that he used CPAP 30/30 days for greater than 4 hours. Average usage was about 8 hours. Residual AHI was 4.7 on set pressure of 7cmH20. He did have a large air leak in 95th percentile of 36.5l/min.  ? ? ?HISTORY (copied from Dr Dohmeier's previous note) ? ?Collin Brown is a 39 - year- old  Caucasian male patient see upon consultation request on 09/05/2020 by FNP [name].  ?He is an active patient of [facility] for general neurology. We are addressing SLEEP MEDICINE only.  ?  ?Chief concern according to patient : [name] has been falling asleep in sometimes dangerous situations, driving home from work, whenever not physically active or mentally stimulated he could easily drop off to sleep.  He works a lot of overtime and will at home he was basically just noted to go to sleep as soon as he could briefly taking a shower and then his family noticed that he had apnea at night.  His employment situation changed during the pandemic.  He was hospitalized with COVID-19 in September 2021 and had a prolonged hospital stay ICU for 16 day. ?Pneumonia, and still not able to return to work. He is now considered disabled. He worked at [employer].  ?His mother then purchased a CPAP machine privately. He has used the CPAP everey day now. He has a good experience with it. Feels rested. He has a wound  vac. Has his arm in a case.  ?  ?The patient has not brought his CPAP here - " I was not told to ". ?  ?IMatthew AYUB Brown  has a past medical history of Acute blood loss anemia, Critical illness myopathy, Diabetes mellitus with complication ([place]),  Persistent SOB. 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 Long term ICU stay for COVID Pneumonia. Respiratory failure.  ?He was seen inn 07-2020 -  Dr Posey Pronto stated EMG and NCV were unsuccessful, she ordered gabapentin. Marland Kitchen   ?The patient never had a sleep study.  ?  ?Social history:  Patient is working in Scient[employer], research (medical)  and lives in a household with mother.  ?Family status is single The patient currently out n medical leave.  ?  ?Tobacco use; never , second hand smoke.  ?ETOH use ; never ,  ?Caffeine intake in form of Coffee( /) Soda( 1-2 / day) Tea (/) or energy drinks. ?Regular exercise- none.   ?  ?Sleep habits are as follows: all sleep habits have changed since hospitalization- The patient's dinner time is between 5 PM. The patient goes to bed at 10-11 PM and falls asleep for 4.5 hours , wakes from pain- needs more medication and goes back for another 2 hours of sleep. Total sleep time 5-7 hours, is on wound vac- the  sounds are present all night.  ?The preferred sleep position is any comfortable position- right side, cant breath on his back, can stay on his left side ( ARM has to be elevated) , with the support of 3-4 pillows.  ?Dreams are reportedly rare since ICU.  ?7 AM is the usual rise time.  ?The patient wakes up spontaneously.  ?He reports not feeling refreshed or restored in AM, with symptoms such as dry mouth, morning headaches, and residual fatigue.  ?Naps are taken infrequently, every 2 weeks or such- lasting from 20-40 minutes and in relation to pain- . ? ? ?REVIEW OF SYSTEMS: Out of a complete 14 system review of symptoms, the patient complains only of the following symptoms, fatigue and all  other reviewed systems are negative. ? ? ?ALLERGIES: ?No Known Allergies ? ? ?HOME MEDICATIONS: ?Outpatient Medications Prior to Visit  ?Medication Sig Dispense Refill  ? acetaminophen (TYLENOL) 500 MG tablet Take by mouth as needed.    ? ascorbic acid (VITAMIN C) 500 MG tablet Take 1 tablet (500 mg total) by mouth daily. 30 tablet 0  ? aspirin 81 MG EC tablet Take 1 tablet by mouth daily.    ? atorvastatin (LIPITOR) 20 MG tablet Take by mouth.    ? Blood Glucose Monitoring Suppl (ACCU-CHEK GUIDE ME) w/Device KIT USE TO CHECK BLOOD SUGAR UP TO 4 TIMES DAILY BEFORE MEALS AS DIRECTED 1 kit 0  ? Elastic Bandages & Supports (JOBST KNEE HIGH COMPRESSION SM) MISC 1 each by Does not apply route daily. 1 each 0  ? gabapentin (NEURONTIN) 800 MG tablet Take 1 tablet (800 mg total) by mouth 4 (four) times daily as needed. 120 tablet 3  ? Ibuprofen 200 MG CAPS Take by mouth as needed.    ? metFORMIN (GLUCOPHAGE-XR) 500 MG 24 hr tablet Take by mouth.    ? metoprolol tartrate (LOPRESSOR) 50 MG tablet Take 1.5 tablets (75 mg total) by mouth 2 (two) times daily. 180 tablet 0  ? Multiple Vitamin (MULTIVITAMIN) capsule Take 1 capsule by mouth daily.    ? Semaglutide,0.25 or 0.5MG/DOS, 2 MG/1.5ML SOPN Inject into the skin.    ? ?No facility-administered medications prior to visit.  ? ? ? ?PAST MEDICAL HISTORY: ?Past Medical History:  ?Diagnosis Date  ? Acute blood loss anemia   ? Critical illness myopathy   ? Diabetes mellitus without complication ([place])   ? Dyspnea 08/23/2020  ? Hearing loss in right ear   ? Hypertension   ? Morbid obesity with BMI of 50.0-59.9, adult ([place])   ? Pneumonia due to COVID-19 virus 05/04/2020  ? 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 Sleep apnea   ? ? ? ?PAST SURGICAL HISTORY: ?Past Surgical History:  ?Procedure Laterality Date  ? COLONOSCOPY WITH PROPOFOL N/A 08/23/2021  ? Procedure: COLONOSCOPY WITH PROPOFOL;  Surgeon: Jonathon Bellows, MD;  Location: [facility] ENDOSCOPY;  Service: Gastroenterology;  Laterality:  N/A;  ? COLONOSCOPY WITH PROPOFOL N/A 09/10/2021  ? Procedure: COLONOSCOPY WITH PROPOFOL;  Surgeon: Jonathon Bellows, MD;  Location: [facility] ENDOSCOPY;  Service: Gastroenterology;  Laterality: N/A;  ? TONSILLECTOMY    ? ULNAR NERVE REPAIR    ? WOUND DEBRIDEMENT N/A 06/29/2020  ? Procedure: DEBRIDEMENT SACRAL WOUND;  Surgeon: Ralene Ok, MD;  Location: [place];  Service: General;  Laterality: N/A;  ? ? ? ?FAMILY HISTORY: ?Family History  ?Problem Relation Age of Onset  ? Diabetes Mellitus II Mother   ? Hypertension Mother   ? Diabetes Mother   ? Diabetes  Mellitus II Father   ? High blood pressure Father   ? Heart attack Father   ? ? ? ?SOCIAL HISTORY: ?Social History  ? ?Socioeconomic History  ? Marital status: Single  ?  Spouse name: Not on file  ? Number of children: Not on file  ? Years of education: Not on file  ? Highest education level: Not on file  ?Occupational History  ? Occupation: Herbalist  ?Tobacco Use  ? Smoking status: Never  ? Smokeless tobacco: Never  ?Vaping Use  ? Vaping Use: Never used  ?Substance and Sexual Activity  ? Alcohol use: No  ? Drug use: No  ? Sexual activity: Yes  ?  Birth control/protection: None  ?Other Topics Concern  ? Not on file  ?Social History Narrative  ? Not on file  ? ?Social Determinants of Health  ? ?Financial Resource Strain: Not on file  ?Food Insecurity: Not on file  ?Transportation Needs: Not on file  ?Physical Activity: Not on file  ?Stress: Not on file  ?Social Connections: Not on file  ?Intimate Partner Violence: Not on file  ? ? ? ?PHYSICAL EXAM ? ?There were no vitals filed for this visit. ? ?There is no height or weight on file to calculate BMI. ? ?Generalized: Well developed, in no acute distress ? ?Cardiology: normal rate and rhythm, no murmur auscultated  ?Respiratory: clear to auscultation bilaterally   ? ?Neurological examination  ?Mentation: Alert oriented to time, place, history taking. Follows all commands speech and language fluent ?Cranial nerve  II-XII: Pupils were equal round reactive to light. Extraocular movements were full, visual field were full on confrontational test. Facial sensation and strength were normal. Head turning and shoulder shrug  were

## 2021-12-16 ENCOUNTER — Ambulatory Visit: Payer: BC Managed Care – PPO | Admitting: Physician Assistant

## 2021-12-16 NOTE — Progress Notes (Deleted)
    Established patient visit   Patient: Collin Brown   DOB: 11/01/1985   36 y.o. Male  MRN: [id number] Visit Date: 12/16/2021  Today's healthcare provider: Lindsay Drubel, PA-C   I, J ,acting as a scribe for Lindsay Drubel, PA-C.,have documented all relevant documentation on the behalf of Lindsay Drubel, PA-C,as directed by  Lindsay Drubel, PA-C while in the presence of Lindsay Drubel, PA-C.  No chief complaint on file.  Subjective    HPI  Diabetes Mellitus Type II, follow-up  Lab Results  Component Value Date   HGBA1C 6.2 04/22/2021   HGBA1C 5.6 08/23/2020   HGBA1C 7.2 (H) 05/03/2020   Last seen for diabetes 6 months ago.  Management since then includes continuing the same treatment. He reports {excellent/good/fair/poor:19665} compliance with treatment. He {is/is not:21021397} having side effects. {document side effects if present:1}  Home blood sugar records: {diabetes glucometry results:16657}  Episodes of hypoglycemia? {Yes/No:20286} {enter details if yes:1}   Current insulin regiment: {***Type 'None' if not taking insulin                                                otherwise enter complete                                                 details of insulin regiment:1} Most Recent Eye Exam: ***  --------------------------------------------------------------------------------------------------- Hypertension, follow-up  BP Readings from Last 3 Encounters:  09/16/21 (!) 141/87  09/10/21 120/85  08/23/21 131/87   Wt Readings from Last 3 Encounters:  09/16/21 (!) 384 lb (174.2 kg)  09/10/21 (!) 388 lb (176 kg)  08/23/21 280 lb 9.3 oz (127.3 kg)     He was last seen for hypertension 6 months ago.  BP at that visit was 110/80. Management since that visit includes none. He reports {excellent/good/fair/poor:19665} compliance with treatment. He {is/is not:9024} having side effects. {document side effects if present:1} He {is/is not:9024}  exercising. He {is/is not:9024} adherent to low salt diet.   Outside blood pressures are {enter patient reported home BP, or 'not being checked':1}.  He {does/does not:200015} smoke.  Use of agents associated with hypertension: {bp agents assoc with hypertension:511::"none"}.   --------------------------------------------------------------------------------------------------- Lipid/Cholesterol, follow-up  Last Lipid Panel: Lab Results  Component Value Date   CHOL 230 (H) 08/23/2020   LDLCALC 141 (H) 08/23/2020   HDL 34 (L) 08/23/2020   TRIG 301 (H) 08/23/2020    He was last seen for this 6 months ago.  Management since that visit includes none.  He reports {excellent/good/fair/poor:19665} compliance with treatment. He {is/is not:9024} having side effects. {document side effects if present:1}  Symptoms: {Yes/No:20286} appetite changes {Yes/No:20286} foot ulcerations  {Yes/No:20286} chest pain {Yes/No:20286} chest pressure/discomfort  {Yes/No:20286} dyspnea {Yes/No:20286} orthopnea  {Yes/No:20286} fatigue {Yes/No:20286} lower extremity edema  {Yes/No:20286} palpitations {Yes/No:20286} paroxysmal nocturnal dyspnea  {Yes/No:20286} nausea {Yes/No:20286} numbness or tingling of extremity  {Yes/No:20286} polydipsia {Yes/No:20286} polyuria  {Yes/No:20286} speech difficulty {Yes/No:20286} syncope   He is following a {diet:21022986} diet. Current exercise: {exercise types:16438}  Last metabolic panel Lab Results  Component Value Date   GLUCOSE 244 (H) 10/15/2020   NA 137 10/15/2020   K 4.4 10/15/2020   BUN 15 10/15/2020   CREATININE 0.73 10/15/2020     GFRNONAA 122 10/15/2020   CALCIUM 10.2 10/15/2020   AST 17 10/15/2020   ALT 30 10/15/2020   The ASCVD Risk score (Arnett DK, et al., 2019) failed to calculate for the following reasons:   The 2019 ASCVD risk score is only valid for ages 46 to  74  ---------------------------------------------------------------------------------------------------   Medications: Outpatient Medications Prior to Visit  Medication Sig   acetaminophen (TYLENOL) 500 MG tablet Take by mouth as needed.   ascorbic acid (VITAMIN C) 500 MG tablet Take 1 tablet (500 mg total) by mouth daily.   aspirin 81 MG EC tablet Take 1 tablet by mouth daily.   atorvastatin (LIPITOR) 20 MG tablet Take by mouth.   Blood Glucose Monitoring Suppl (ACCU-CHEK GUIDE ME) w/Device KIT USE TO CHECK BLOOD SUGAR UP TO 4 TIMES DAILY BEFORE MEALS AS DIRECTED   Elastic Bandages & Supports (JOBST KNEE HIGH COMPRESSION SM) MISC 1 each by Does not apply route daily.   gabapentin (NEURONTIN) 800 MG tablet Take 1 tablet (800 mg total) by mouth 4 (four) times daily as needed.   Ibuprofen 200 MG CAPS Take by mouth as needed.   metFORMIN (GLUCOPHAGE-XR) 500 MG 24 hr tablet Take by mouth.   metoprolol tartrate (LOPRESSOR) 50 MG tablet Take 1.5 tablets (75 mg total) by mouth 2 (two) times daily.   Multiple Vitamin (MULTIVITAMIN) capsule Take 1 capsule by mouth daily.   Semaglutide,0.25 or 0.5MG/DOS, 2 MG/1.5ML SOPN Inject into the skin.   No facility-administered medications prior to visit.    Review of Systems  {Labs  Heme  Chem  Endocrine  Serology  Results Review (optional):23779}   Objective    There were no vitals taken for this visit. {Show previous vital signs (optional):23777}  Physical Exam  ***  No results found for any visits on 12/16/21.  Assessment & Plan     ***  No follow-ups on file.      {provider attestation***:1}   Mikey Kirschner, PA-C  [facility] [phone] (phone) [phone] (fax)  [place]
